# Patient Record
Sex: Female | Born: 1935 | Race: White | Hispanic: No | Marital: Single | Smoking: Former smoker
Health system: Southern US, Community
[De-identification: ages and names within clinical notes are randomized; demographics above are authoritative.]

## PROBLEM LIST (undated history)

## (undated) DIAGNOSIS — H269 Unspecified cataract: Secondary | ICD-10-CM

## (undated) DIAGNOSIS — N390 Urinary tract infection, site not specified: Secondary | ICD-10-CM

## (undated) DIAGNOSIS — K317 Polyp of stomach and duodenum: Secondary | ICD-10-CM

## (undated) DIAGNOSIS — T4145XA Adverse effect of unspecified anesthetic, initial encounter: Secondary | ICD-10-CM

## (undated) DIAGNOSIS — J4489 Other specified chronic obstructive pulmonary disease: Secondary | ICD-10-CM

## (undated) DIAGNOSIS — Z950 Presence of cardiac pacemaker: Secondary | ICD-10-CM

## (undated) DIAGNOSIS — C439 Malignant melanoma of skin, unspecified: Secondary | ICD-10-CM

## (undated) DIAGNOSIS — J449 Chronic obstructive pulmonary disease, unspecified: Secondary | ICD-10-CM

## (undated) DIAGNOSIS — T8859XA Other complications of anesthesia, initial encounter: Secondary | ICD-10-CM

## (undated) DIAGNOSIS — J329 Chronic sinusitis, unspecified: Secondary | ICD-10-CM

## (undated) DIAGNOSIS — K2960 Other gastritis without bleeding: Secondary | ICD-10-CM

## (undated) DIAGNOSIS — K2270 Barrett's esophagus without dysplasia: Secondary | ICD-10-CM

## (undated) DIAGNOSIS — I714 Abdominal aortic aneurysm, without rupture, unspecified: Secondary | ICD-10-CM

## (undated) DIAGNOSIS — F3289 Other specified depressive episodes: Secondary | ICD-10-CM

## (undated) DIAGNOSIS — M1990 Unspecified osteoarthritis, unspecified site: Secondary | ICD-10-CM

## (undated) DIAGNOSIS — F329 Major depressive disorder, single episode, unspecified: Secondary | ICD-10-CM

## (undated) DIAGNOSIS — I509 Heart failure, unspecified: Secondary | ICD-10-CM

## (undated) DIAGNOSIS — I340 Nonrheumatic mitral (valve) insufficiency: Secondary | ICD-10-CM

## (undated) DIAGNOSIS — F419 Anxiety disorder, unspecified: Secondary | ICD-10-CM

## (undated) DIAGNOSIS — G2581 Restless legs syndrome: Secondary | ICD-10-CM

## (undated) DIAGNOSIS — I480 Paroxysmal atrial fibrillation: Secondary | ICD-10-CM

## (undated) DIAGNOSIS — M7120 Synovial cyst of popliteal space [Baker], unspecified knee: Secondary | ICD-10-CM

## (undated) DIAGNOSIS — I723 Aneurysm of iliac artery: Secondary | ICD-10-CM

## (undated) DIAGNOSIS — I5022 Chronic systolic (congestive) heart failure: Secondary | ICD-10-CM

## (undated) DIAGNOSIS — J189 Pneumonia, unspecified organism: Secondary | ICD-10-CM

## (undated) DIAGNOSIS — G4700 Insomnia, unspecified: Secondary | ICD-10-CM

## (undated) DIAGNOSIS — M8580 Other specified disorders of bone density and structure, unspecified site: Secondary | ICD-10-CM

## (undated) DIAGNOSIS — Z95810 Presence of automatic (implantable) cardiac defibrillator: Secondary | ICD-10-CM

## (undated) DIAGNOSIS — I428 Other cardiomyopathies: Secondary | ICD-10-CM

## (undated) DIAGNOSIS — K432 Incisional hernia without obstruction or gangrene: Secondary | ICD-10-CM

## (undated) DIAGNOSIS — M40209 Unspecified kyphosis, site unspecified: Secondary | ICD-10-CM

## (undated) DIAGNOSIS — K5792 Diverticulitis of intestine, part unspecified, without perforation or abscess without bleeding: Secondary | ICD-10-CM

## (undated) DIAGNOSIS — E039 Hypothyroidism, unspecified: Secondary | ICD-10-CM

## (undated) DIAGNOSIS — I10 Essential (primary) hypertension: Secondary | ICD-10-CM

## (undated) DIAGNOSIS — J339 Nasal polyp, unspecified: Secondary | ICD-10-CM

## (undated) DIAGNOSIS — M25569 Pain in unspecified knee: Secondary | ICD-10-CM

## (undated) DIAGNOSIS — K219 Gastro-esophageal reflux disease without esophagitis: Secondary | ICD-10-CM

## (undated) DIAGNOSIS — T7840XA Allergy, unspecified, initial encounter: Secondary | ICD-10-CM

## (undated) DIAGNOSIS — I447 Left bundle-branch block, unspecified: Secondary | ICD-10-CM

## (undated) DIAGNOSIS — Z5189 Encounter for other specified aftercare: Secondary | ICD-10-CM

## (undated) DIAGNOSIS — L659 Nonscarring hair loss, unspecified: Secondary | ICD-10-CM

## (undated) DIAGNOSIS — J42 Unspecified chronic bronchitis: Secondary | ICD-10-CM

## (undated) DIAGNOSIS — E785 Hyperlipidemia, unspecified: Secondary | ICD-10-CM

## (undated) DIAGNOSIS — D126 Benign neoplasm of colon, unspecified: Secondary | ICD-10-CM

## (undated) DIAGNOSIS — R002 Palpitations: Secondary | ICD-10-CM

## (undated) DIAGNOSIS — F341 Dysthymic disorder: Secondary | ICD-10-CM

## (undated) HISTORY — DX: Other specified chronic obstructive pulmonary disease: J44.89

## (undated) HISTORY — DX: Nonscarring hair loss, unspecified: L65.9

## (undated) HISTORY — DX: Dysthymic disorder: F34.1

## (undated) HISTORY — PX: TONSILLECTOMY: SUR1361

## (undated) HISTORY — DX: Unspecified kyphosis, site unspecified: M40.209

## (undated) HISTORY — DX: Left bundle-branch block, unspecified: I44.7

## (undated) HISTORY — PX: OTHER SURGICAL HISTORY: SHX169

## (undated) HISTORY — DX: Diverticulitis of intestine, part unspecified, without perforation or abscess without bleeding: K57.92

## (undated) HISTORY — DX: Other gastritis without bleeding: K29.60

## (undated) HISTORY — DX: Polyp of stomach and duodenum: K31.7

## (undated) HISTORY — PX: COLONOSCOPY: SHX174

## (undated) HISTORY — DX: Restless legs syndrome: G25.81

## (undated) HISTORY — DX: Allergy, unspecified, initial encounter: T78.40XA

## (undated) HISTORY — DX: Presence of automatic (implantable) cardiac defibrillator: Z95.810

## (undated) HISTORY — DX: Hyperlipidemia, unspecified: E78.5

## (undated) HISTORY — DX: Other specified disorders of bone density and structure, unspecified site: M85.80

## (undated) HISTORY — DX: Malignant melanoma of skin, unspecified: C43.9

## (undated) HISTORY — PX: POLYPECTOMY: SHX149

## (undated) HISTORY — DX: Anxiety disorder, unspecified: F41.9

## (undated) HISTORY — DX: Pain in unspecified knee: M25.569

## (undated) HISTORY — DX: Nasal polyp, unspecified: J33.9

## (undated) HISTORY — DX: Heart failure, unspecified: I50.9

## (undated) HISTORY — DX: Nonrheumatic mitral (valve) insufficiency: I34.0

## (undated) HISTORY — DX: Unspecified chronic bronchitis: J42

## (undated) HISTORY — DX: Palpitations: R00.2

## (undated) HISTORY — PX: UPPER GASTROINTESTINAL ENDOSCOPY: SHX188

## (undated) HISTORY — DX: Barrett's esophagus without dysplasia: K22.70

## (undated) HISTORY — DX: Essential (primary) hypertension: I10

## (undated) HISTORY — DX: Other cardiomyopathies: I42.8

## (undated) HISTORY — DX: Unspecified osteoarthritis, unspecified site: M19.90

## (undated) HISTORY — PX: APPENDECTOMY: SHX54

## (undated) HISTORY — DX: Other specified depressive episodes: F32.89

## (undated) HISTORY — DX: Unspecified cataract: H26.9

## (undated) HISTORY — DX: Chronic sinusitis, unspecified: J32.9

## (undated) HISTORY — DX: Encounter for other specified aftercare: Z51.89

## (undated) HISTORY — DX: Gastro-esophageal reflux disease without esophagitis: K21.9

## (undated) HISTORY — DX: Benign neoplasm of colon, unspecified: D12.6

## (undated) HISTORY — DX: Synovial cyst of popliteal space (Baker), unspecified knee: M71.20

## (undated) HISTORY — DX: Major depressive disorder, single episode, unspecified: F32.9

## (undated) HISTORY — DX: Chronic obstructive pulmonary disease, unspecified: J44.9

## (undated) HISTORY — DX: Insomnia, unspecified: G47.00

## (undated) HISTORY — DX: Hypothyroidism, unspecified: E03.9

---

## 1974-12-07 HISTORY — PX: ABDOMINAL HYSTERECTOMY: SHX81

## 1982-03-08 HISTORY — PX: OTHER SURGICAL HISTORY: SHX169

## 1986-03-08 HISTORY — PX: OTHER SURGICAL HISTORY: SHX169

## 1997-07-08 ENCOUNTER — Other Ambulatory Visit: Admission: RE | Admit: 1997-07-08 | Discharge: 1997-07-08 | Payer: Self-pay | Admitting: Obstetrics & Gynecology

## 1998-01-06 ENCOUNTER — Other Ambulatory Visit: Admission: RE | Admit: 1998-01-06 | Discharge: 1998-01-06 | Payer: Self-pay | Admitting: Obstetrics & Gynecology

## 1998-07-10 ENCOUNTER — Other Ambulatory Visit: Admission: RE | Admit: 1998-07-10 | Discharge: 1998-07-10 | Payer: Self-pay | Admitting: Obstetrics & Gynecology

## 1999-01-14 ENCOUNTER — Other Ambulatory Visit: Admission: RE | Admit: 1999-01-14 | Discharge: 1999-01-14 | Payer: Self-pay | Admitting: Obstetrics & Gynecology

## 1999-03-09 HISTORY — PX: OTHER SURGICAL HISTORY: SHX169

## 1999-07-13 ENCOUNTER — Other Ambulatory Visit: Admission: RE | Admit: 1999-07-13 | Discharge: 1999-07-13 | Payer: Self-pay | Admitting: Obstetrics & Gynecology

## 2000-09-18 ENCOUNTER — Other Ambulatory Visit: Admission: RE | Admit: 2000-09-18 | Discharge: 2000-09-18 | Payer: Self-pay | Admitting: Obstetrics & Gynecology

## 2002-05-09 ENCOUNTER — Encounter: Payer: Self-pay | Admitting: Gastroenterology

## 2002-06-07 ENCOUNTER — Encounter: Payer: Self-pay | Admitting: Gastroenterology

## 2002-06-07 DIAGNOSIS — K317 Polyp of stomach and duodenum: Secondary | ICD-10-CM

## 2002-06-07 HISTORY — DX: Polyp of stomach and duodenum: K31.7

## 2002-08-13 ENCOUNTER — Encounter: Payer: Self-pay | Admitting: General Surgery

## 2002-08-13 ENCOUNTER — Ambulatory Visit (HOSPITAL_COMMUNITY): Admission: RE | Admit: 2002-08-13 | Discharge: 2002-08-13 | Payer: Self-pay | Admitting: General Surgery

## 2002-08-24 ENCOUNTER — Encounter: Payer: Self-pay | Admitting: General Surgery

## 2002-08-24 ENCOUNTER — Encounter: Admission: RE | Admit: 2002-08-24 | Discharge: 2002-08-24 | Payer: Self-pay | Admitting: General Surgery

## 2002-08-27 ENCOUNTER — Ambulatory Visit (HOSPITAL_BASED_OUTPATIENT_CLINIC_OR_DEPARTMENT_OTHER): Admission: RE | Admit: 2002-08-27 | Discharge: 2002-08-27 | Payer: Self-pay | Admitting: General Surgery

## 2002-08-27 ENCOUNTER — Encounter (INDEPENDENT_AMBULATORY_CARE_PROVIDER_SITE_OTHER): Payer: Self-pay | Admitting: *Deleted

## 2002-08-27 ENCOUNTER — Encounter: Payer: Self-pay | Admitting: General Surgery

## 2002-09-17 ENCOUNTER — Encounter: Payer: Self-pay | Admitting: General Surgery

## 2002-09-17 ENCOUNTER — Ambulatory Visit (HOSPITAL_COMMUNITY): Admission: RE | Admit: 2002-09-17 | Discharge: 2002-09-18 | Payer: Self-pay | Admitting: General Surgery

## 2002-09-17 ENCOUNTER — Encounter (INDEPENDENT_AMBULATORY_CARE_PROVIDER_SITE_OTHER): Payer: Self-pay | Admitting: Specialist

## 2002-10-26 ENCOUNTER — Ambulatory Visit (HOSPITAL_COMMUNITY): Admission: RE | Admit: 2002-10-26 | Discharge: 2002-10-26 | Payer: Self-pay | Admitting: Hematology & Oncology

## 2002-10-26 ENCOUNTER — Encounter: Payer: Self-pay | Admitting: Hematology & Oncology

## 2002-12-18 ENCOUNTER — Encounter: Payer: Self-pay | Admitting: Hematology & Oncology

## 2002-12-18 ENCOUNTER — Ambulatory Visit (HOSPITAL_COMMUNITY): Admission: RE | Admit: 2002-12-18 | Discharge: 2002-12-18 | Payer: Self-pay | Admitting: Hematology & Oncology

## 2003-03-19 ENCOUNTER — Ambulatory Visit (HOSPITAL_COMMUNITY): Admission: RE | Admit: 2003-03-19 | Discharge: 2003-03-19 | Payer: Self-pay | Admitting: Hematology & Oncology

## 2003-04-17 ENCOUNTER — Ambulatory Visit (HOSPITAL_COMMUNITY): Admission: RE | Admit: 2003-04-17 | Discharge: 2003-04-17 | Payer: Self-pay | Admitting: Hematology & Oncology

## 2003-06-12 ENCOUNTER — Ambulatory Visit (HOSPITAL_COMMUNITY): Admission: RE | Admit: 2003-06-12 | Discharge: 2003-06-12 | Payer: Self-pay | Admitting: Hematology & Oncology

## 2003-07-24 ENCOUNTER — Other Ambulatory Visit: Admission: RE | Admit: 2003-07-24 | Discharge: 2003-07-24 | Payer: Self-pay | Admitting: Obstetrics & Gynecology

## 2003-10-09 ENCOUNTER — Ambulatory Visit (HOSPITAL_COMMUNITY): Admission: RE | Admit: 2003-10-09 | Discharge: 2003-10-09 | Payer: Self-pay | Admitting: Hematology & Oncology

## 2004-01-26 ENCOUNTER — Ambulatory Visit: Payer: Self-pay | Admitting: Hematology & Oncology

## 2004-02-03 ENCOUNTER — Ambulatory Visit (HOSPITAL_COMMUNITY): Admission: RE | Admit: 2004-02-03 | Discharge: 2004-02-03 | Payer: Self-pay | Admitting: Hematology & Oncology

## 2004-05-10 ENCOUNTER — Ambulatory Visit: Payer: Self-pay | Admitting: Hematology & Oncology

## 2004-05-13 ENCOUNTER — Ambulatory Visit (HOSPITAL_COMMUNITY): Admission: RE | Admit: 2004-05-13 | Discharge: 2004-05-13 | Payer: Self-pay | Admitting: Hematology & Oncology

## 2004-06-01 ENCOUNTER — Ambulatory Visit: Payer: Self-pay | Admitting: Cardiology

## 2004-06-10 ENCOUNTER — Ambulatory Visit: Payer: Self-pay

## 2004-06-22 ENCOUNTER — Ambulatory Visit: Payer: Self-pay | Admitting: Cardiology

## 2004-06-29 ENCOUNTER — Ambulatory Visit: Payer: Self-pay | Admitting: Cardiology

## 2004-07-02 ENCOUNTER — Inpatient Hospital Stay (HOSPITAL_BASED_OUTPATIENT_CLINIC_OR_DEPARTMENT_OTHER): Admission: RE | Admit: 2004-07-02 | Discharge: 2004-07-02 | Payer: Self-pay | Admitting: Cardiology

## 2004-07-02 ENCOUNTER — Ambulatory Visit: Payer: Self-pay | Admitting: Cardiology

## 2004-07-10 ENCOUNTER — Ambulatory Visit: Payer: Self-pay | Admitting: Cardiology

## 2004-07-23 ENCOUNTER — Ambulatory Visit: Payer: Self-pay | Admitting: Cardiology

## 2004-08-05 ENCOUNTER — Ambulatory Visit: Payer: Self-pay | Admitting: Cardiology

## 2004-08-06 DIAGNOSIS — K2960 Other gastritis without bleeding: Secondary | ICD-10-CM

## 2004-08-06 HISTORY — DX: Other gastritis without bleeding: K29.60

## 2004-08-11 ENCOUNTER — Ambulatory Visit: Payer: Self-pay | Admitting: Gastroenterology

## 2004-08-12 ENCOUNTER — Ambulatory Visit: Payer: Self-pay | Admitting: Gastroenterology

## 2004-08-12 ENCOUNTER — Other Ambulatory Visit: Admission: RE | Admit: 2004-08-12 | Discharge: 2004-08-12 | Payer: Self-pay | Admitting: Gastroenterology

## 2004-08-12 ENCOUNTER — Encounter (INDEPENDENT_AMBULATORY_CARE_PROVIDER_SITE_OTHER): Payer: Self-pay | Admitting: *Deleted

## 2004-08-13 ENCOUNTER — Inpatient Hospital Stay (HOSPITAL_COMMUNITY): Admission: AD | Admit: 2004-08-13 | Discharge: 2004-08-15 | Payer: Self-pay | Admitting: *Deleted

## 2004-08-13 ENCOUNTER — Ambulatory Visit: Payer: Self-pay | Admitting: *Deleted

## 2004-08-13 ENCOUNTER — Emergency Department (HOSPITAL_COMMUNITY): Admission: EM | Admit: 2004-08-13 | Discharge: 2004-08-13 | Payer: Self-pay | Admitting: Emergency Medicine

## 2004-09-03 ENCOUNTER — Ambulatory Visit: Payer: Self-pay | Admitting: Cardiology

## 2004-09-11 ENCOUNTER — Encounter: Payer: Self-pay | Admitting: Gastroenterology

## 2004-09-15 ENCOUNTER — Ambulatory Visit: Payer: Self-pay | Admitting: Hematology & Oncology

## 2004-09-18 ENCOUNTER — Ambulatory Visit (HOSPITAL_COMMUNITY): Admission: RE | Admit: 2004-09-18 | Discharge: 2004-09-18 | Payer: Self-pay | Admitting: Hematology & Oncology

## 2004-09-22 ENCOUNTER — Ambulatory Visit: Payer: Self-pay | Admitting: Cardiology

## 2004-10-12 ENCOUNTER — Ambulatory Visit: Payer: Self-pay | Admitting: Cardiology

## 2004-10-14 ENCOUNTER — Ambulatory Visit: Payer: Self-pay | Admitting: Cardiology

## 2004-10-26 ENCOUNTER — Ambulatory Visit: Payer: Self-pay | Admitting: Internal Medicine

## 2005-03-02 ENCOUNTER — Ambulatory Visit: Payer: Self-pay | Admitting: Hematology & Oncology

## 2005-03-05 ENCOUNTER — Ambulatory Visit (HOSPITAL_COMMUNITY): Admission: RE | Admit: 2005-03-05 | Discharge: 2005-03-05 | Payer: Self-pay | Admitting: Hematology & Oncology

## 2005-05-04 ENCOUNTER — Ambulatory Visit: Payer: Self-pay | Admitting: Internal Medicine

## 2005-05-14 ENCOUNTER — Ambulatory Visit: Payer: Self-pay | Admitting: Internal Medicine

## 2005-05-14 ENCOUNTER — Inpatient Hospital Stay (HOSPITAL_COMMUNITY): Admission: RE | Admit: 2005-05-14 | Discharge: 2005-05-15 | Payer: Self-pay | Admitting: Internal Medicine

## 2005-06-15 ENCOUNTER — Ambulatory Visit: Payer: Self-pay | Admitting: Internal Medicine

## 2005-06-21 ENCOUNTER — Ambulatory Visit: Payer: Self-pay | Admitting: Internal Medicine

## 2005-06-25 ENCOUNTER — Ambulatory Visit: Payer: Self-pay | Admitting: Internal Medicine

## 2005-07-01 ENCOUNTER — Ambulatory Visit: Payer: Self-pay | Admitting: Cardiology

## 2005-07-08 ENCOUNTER — Ambulatory Visit: Payer: Self-pay | Admitting: Cardiology

## 2005-07-15 ENCOUNTER — Ambulatory Visit: Payer: Self-pay | Admitting: Cardiology

## 2005-07-22 ENCOUNTER — Ambulatory Visit: Payer: Self-pay | Admitting: Cardiology

## 2005-08-05 ENCOUNTER — Ambulatory Visit: Payer: Self-pay | Admitting: Cardiology

## 2005-08-16 ENCOUNTER — Ambulatory Visit: Payer: Self-pay | Admitting: Cardiology

## 2005-08-24 ENCOUNTER — Ambulatory Visit: Payer: Self-pay | Admitting: Internal Medicine

## 2005-09-01 ENCOUNTER — Ambulatory Visit: Payer: Self-pay | Admitting: Hematology & Oncology

## 2005-09-07 LAB — CBC WITH DIFFERENTIAL/PLATELET
Basophils Absolute: 0 10*3/uL (ref 0.0–0.1)
Eosinophils Absolute: 0.2 10*3/uL (ref 0.0–0.5)
HGB: 13.8 g/dL (ref 11.6–15.9)
MCV: 91.9 fL (ref 81.0–101.0)
MONO#: 0.7 10*3/uL (ref 0.1–0.9)
MONO%: 10.1 % (ref 0.0–13.0)
NEUT#: 4.2 10*3/uL (ref 1.5–6.5)
Platelets: 234 10*3/uL (ref 145–400)
RBC: 4.42 10*6/uL (ref 3.70–5.32)
RDW: 13.3 % (ref 11.3–14.5)
WBC: 7 10*3/uL (ref 3.9–10.0)

## 2005-09-07 LAB — COMPREHENSIVE METABOLIC PANEL
Albumin: 4 g/dL (ref 3.5–5.2)
Alkaline Phosphatase: 88 U/L (ref 39–117)
BUN: 20 mg/dL (ref 6–23)
CO2: 30 mEq/L (ref 19–32)
Calcium: 9.1 mg/dL (ref 8.4–10.5)
Glucose, Bld: 82 mg/dL (ref 70–99)
Potassium: 4.3 mEq/L (ref 3.5–5.3)
Sodium: 140 mEq/L (ref 135–145)
Total Protein: 6.2 g/dL (ref 6.0–8.3)

## 2005-09-07 LAB — LACTATE DEHYDROGENASE: LDH: 186 U/L (ref 94–250)

## 2005-09-10 ENCOUNTER — Ambulatory Visit (HOSPITAL_COMMUNITY): Admission: RE | Admit: 2005-09-10 | Discharge: 2005-09-10 | Payer: Self-pay | Admitting: Hematology & Oncology

## 2005-09-23 ENCOUNTER — Encounter: Payer: Self-pay | Admitting: Vascular Surgery

## 2005-09-23 ENCOUNTER — Ambulatory Visit: Admission: RE | Admit: 2005-09-23 | Discharge: 2005-09-23 | Payer: Self-pay | Admitting: Hematology & Oncology

## 2006-03-07 ENCOUNTER — Ambulatory Visit: Payer: Self-pay | Admitting: Hematology & Oncology

## 2006-03-11 LAB — CBC WITH DIFFERENTIAL/PLATELET
BASO%: 0.4 % (ref 0.0–2.0)
Eosinophils Absolute: 0.2 10*3/uL (ref 0.0–0.5)
LYMPH%: 23.9 % (ref 14.0–48.0)
MCHC: 34.3 g/dL (ref 32.0–36.0)
MONO#: 0.7 10*3/uL (ref 0.1–0.9)
NEUT#: 4.4 10*3/uL (ref 1.5–6.5)
RBC: 4.55 10*6/uL (ref 3.70–5.32)
RDW: 12.9 % (ref 11.3–14.5)
WBC: 6.9 10*3/uL (ref 3.9–10.0)
lymph#: 1.7 10*3/uL (ref 0.9–3.3)

## 2006-03-11 LAB — COMPREHENSIVE METABOLIC PANEL
ALT: 18 U/L (ref 0–35)
Albumin: 4.4 g/dL (ref 3.5–5.2)
CO2: 27 mEq/L (ref 19–32)
Chloride: 103 mEq/L (ref 96–112)
Glucose, Bld: 81 mg/dL (ref 70–99)
Potassium: 4.2 mEq/L (ref 3.5–5.3)
Sodium: 140 mEq/L (ref 135–145)
Total Bilirubin: 0.4 mg/dL (ref 0.3–1.2)
Total Protein: 6.6 g/dL (ref 6.0–8.3)

## 2006-03-18 ENCOUNTER — Ambulatory Visit (HOSPITAL_COMMUNITY): Admission: RE | Admit: 2006-03-18 | Discharge: 2006-03-18 | Payer: Self-pay | Admitting: Hematology & Oncology

## 2006-08-23 ENCOUNTER — Ambulatory Visit: Payer: Self-pay | Admitting: Hematology & Oncology

## 2006-08-26 LAB — CBC WITH DIFFERENTIAL/PLATELET
BASO%: 0.8 % (ref 0.0–2.0)
LYMPH%: 27 % (ref 14.0–48.0)
MCHC: 35.2 g/dL (ref 32.0–36.0)
MONO#: 0.6 10*3/uL (ref 0.1–0.9)
MONO%: 8.8 % (ref 0.0–13.0)
NEUT#: 4.4 10*3/uL (ref 1.5–6.5)
Platelets: 191 10*3/uL (ref 145–400)
RBC: 4.47 10*6/uL (ref 3.70–5.32)
RDW: 12.9 % (ref 11.3–14.5)
WBC: 7.4 10*3/uL (ref 3.9–10.0)

## 2006-08-26 LAB — COMPREHENSIVE METABOLIC PANEL
ALT: 19 U/L (ref 0–35)
Alkaline Phosphatase: 84 U/L (ref 39–117)
Sodium: 138 mEq/L (ref 135–145)
Total Bilirubin: 0.5 mg/dL (ref 0.3–1.2)
Total Protein: 6.2 g/dL (ref 6.0–8.3)

## 2006-09-02 ENCOUNTER — Ambulatory Visit (HOSPITAL_COMMUNITY): Admission: RE | Admit: 2006-09-02 | Discharge: 2006-09-02 | Payer: Self-pay | Admitting: Hematology & Oncology

## 2006-09-13 ENCOUNTER — Ambulatory Visit: Payer: Self-pay | Admitting: Internal Medicine

## 2007-03-13 ENCOUNTER — Ambulatory Visit: Payer: Self-pay | Admitting: Hematology & Oncology

## 2007-03-15 LAB — COMPREHENSIVE METABOLIC PANEL
ALT: 21 U/L (ref 0–35)
BUN: 13 mg/dL (ref 6–23)
CO2: 25 mEq/L (ref 19–32)
Creatinine, Ser: 0.76 mg/dL (ref 0.40–1.20)
Total Bilirubin: 0.5 mg/dL (ref 0.3–1.2)

## 2007-03-15 LAB — CBC WITH DIFFERENTIAL/PLATELET
BASO%: 0.7 % (ref 0.0–2.0)
HCT: 44 % (ref 34.8–46.6)
LYMPH%: 24.6 % (ref 14.0–48.0)
MCH: 31.8 pg (ref 26.0–34.0)
MCHC: 34.7 g/dL (ref 32.0–36.0)
MONO#: 0.6 10*3/uL (ref 0.1–0.9)
NEUT%: 64 % (ref 39.6–76.8)
Platelets: 221 10*3/uL (ref 145–400)

## 2007-03-15 LAB — LACTATE DEHYDROGENASE: LDH: 229 U/L (ref 94–250)

## 2007-03-17 ENCOUNTER — Ambulatory Visit (HOSPITAL_COMMUNITY): Admission: RE | Admit: 2007-03-17 | Discharge: 2007-03-17 | Payer: Self-pay | Admitting: Hematology & Oncology

## 2007-09-14 ENCOUNTER — Ambulatory Visit: Payer: Self-pay | Admitting: Hematology & Oncology

## 2007-09-19 LAB — COMPREHENSIVE METABOLIC PANEL
CO2: 26 mEq/L (ref 19–32)
Creatinine, Ser: 0.74 mg/dL (ref 0.40–1.20)
Glucose, Bld: 77 mg/dL (ref 70–99)
Total Bilirubin: 0.5 mg/dL (ref 0.3–1.2)

## 2007-09-19 LAB — CBC WITH DIFFERENTIAL/PLATELET
Eosinophils Absolute: 0.3 10*3/uL (ref 0.0–0.5)
HCT: 40.7 % (ref 34.8–46.6)
LYMPH%: 19.7 % (ref 14.0–48.0)
MCHC: 35.3 g/dL (ref 32.0–36.0)
MONO#: 0.7 10*3/uL (ref 0.1–0.9)
NEUT#: 5.3 10*3/uL (ref 1.5–6.5)
NEUT%: 67.5 % (ref 39.6–76.8)
Platelets: 215 10*3/uL (ref 145–400)
WBC: 7.8 10*3/uL (ref 3.9–10.0)

## 2007-09-19 LAB — LACTATE DEHYDROGENASE: LDH: 188 U/L (ref 94–250)

## 2007-09-22 ENCOUNTER — Ambulatory Visit (HOSPITAL_COMMUNITY): Admission: RE | Admit: 2007-09-22 | Discharge: 2007-09-22 | Payer: Self-pay | Admitting: Hematology & Oncology

## 2007-09-26 ENCOUNTER — Ambulatory Visit: Payer: Self-pay | Admitting: Hematology & Oncology

## 2007-11-07 ENCOUNTER — Ambulatory Visit: Payer: Self-pay | Admitting: Internal Medicine

## 2008-01-11 ENCOUNTER — Telehealth: Payer: Self-pay | Admitting: Gastroenterology

## 2008-03-14 ENCOUNTER — Ambulatory Visit: Payer: Self-pay | Admitting: Hematology & Oncology

## 2008-03-27 ENCOUNTER — Ambulatory Visit (HOSPITAL_COMMUNITY): Admission: RE | Admit: 2008-03-27 | Discharge: 2008-03-27 | Payer: Self-pay | Admitting: Hematology & Oncology

## 2008-03-27 ENCOUNTER — Encounter: Payer: Self-pay | Admitting: Gastroenterology

## 2008-04-02 ENCOUNTER — Ambulatory Visit: Payer: Self-pay | Admitting: Hematology & Oncology

## 2008-04-03 ENCOUNTER — Encounter: Payer: Self-pay | Admitting: Gastroenterology

## 2008-04-04 ENCOUNTER — Telehealth (INDEPENDENT_AMBULATORY_CARE_PROVIDER_SITE_OTHER): Payer: Self-pay

## 2008-04-08 ENCOUNTER — Ambulatory Visit: Payer: Self-pay | Admitting: Gastroenterology

## 2008-04-08 DIAGNOSIS — G4730 Sleep apnea, unspecified: Secondary | ICD-10-CM | POA: Insufficient documentation

## 2008-04-08 DIAGNOSIS — K2270 Barrett's esophagus without dysplasia: Secondary | ICD-10-CM

## 2008-04-08 DIAGNOSIS — R1013 Epigastric pain: Secondary | ICD-10-CM | POA: Insufficient documentation

## 2008-04-08 DIAGNOSIS — R933 Abnormal findings on diagnostic imaging of other parts of digestive tract: Secondary | ICD-10-CM | POA: Insufficient documentation

## 2008-04-08 HISTORY — DX: Barrett's esophagus without dysplasia: K22.70

## 2008-04-22 ENCOUNTER — Telehealth: Payer: Self-pay | Admitting: Gastroenterology

## 2008-04-24 ENCOUNTER — Encounter: Payer: Self-pay | Admitting: Gastroenterology

## 2008-04-24 ENCOUNTER — Ambulatory Visit: Payer: Self-pay | Admitting: Gastroenterology

## 2008-04-26 ENCOUNTER — Encounter: Payer: Self-pay | Admitting: Gastroenterology

## 2008-04-29 ENCOUNTER — Telehealth (INDEPENDENT_AMBULATORY_CARE_PROVIDER_SITE_OTHER): Payer: Self-pay | Admitting: *Deleted

## 2008-04-29 DIAGNOSIS — K319 Disease of stomach and duodenum, unspecified: Secondary | ICD-10-CM | POA: Insufficient documentation

## 2008-04-30 ENCOUNTER — Encounter: Payer: Self-pay | Admitting: Gastroenterology

## 2008-05-01 ENCOUNTER — Telehealth: Payer: Self-pay | Admitting: Gastroenterology

## 2008-05-02 ENCOUNTER — Ambulatory Visit (HOSPITAL_COMMUNITY): Admission: RE | Admit: 2008-05-02 | Discharge: 2008-05-02 | Payer: Self-pay | Admitting: Gastroenterology

## 2008-05-02 ENCOUNTER — Encounter: Payer: Self-pay | Admitting: Gastroenterology

## 2008-05-02 ENCOUNTER — Ambulatory Visit: Payer: Self-pay | Admitting: Gastroenterology

## 2008-05-03 ENCOUNTER — Telehealth: Payer: Self-pay | Admitting: Gastroenterology

## 2008-05-08 ENCOUNTER — Encounter: Payer: Self-pay | Admitting: Gastroenterology

## 2008-05-29 ENCOUNTER — Ambulatory Visit: Payer: Self-pay | Admitting: Gastroenterology

## 2008-05-29 DIAGNOSIS — K2270 Barrett's esophagus without dysplasia: Secondary | ICD-10-CM | POA: Insufficient documentation

## 2008-05-29 DIAGNOSIS — K2970 Gastritis, unspecified, without bleeding: Secondary | ICD-10-CM | POA: Insufficient documentation

## 2008-05-29 DIAGNOSIS — K2990 Gastroduodenitis, unspecified, without bleeding: Secondary | ICD-10-CM

## 2008-06-27 ENCOUNTER — Encounter: Payer: Self-pay | Admitting: Gastroenterology

## 2008-09-27 ENCOUNTER — Ambulatory Visit: Payer: Self-pay | Admitting: Hematology & Oncology

## 2008-10-02 LAB — CBC WITH DIFFERENTIAL/PLATELET
BASO%: 0.4 % (ref 0.0–2.0)
EOS%: 2.9 % (ref 0.0–7.0)
MCH: 31.4 pg (ref 25.1–34.0)
MCHC: 34.3 g/dL (ref 31.5–36.0)
MONO#: 0.6 10*3/uL (ref 0.1–0.9)
RBC: 4.41 10*6/uL (ref 3.70–5.45)
WBC: 6.1 10*3/uL (ref 3.9–10.3)
lymph#: 1.4 10*3/uL (ref 0.9–3.3)

## 2008-10-02 LAB — COMPREHENSIVE METABOLIC PANEL
AST: 22 U/L (ref 0–37)
Alkaline Phosphatase: 79 U/L (ref 39–117)
Glucose, Bld: 90 mg/dL (ref 70–99)
Sodium: 134 mEq/L — ABNORMAL LOW (ref 135–145)
Total Bilirubin: 0.7 mg/dL (ref 0.3–1.2)
Total Protein: 6.3 g/dL (ref 6.0–8.3)

## 2008-10-04 ENCOUNTER — Encounter (INDEPENDENT_AMBULATORY_CARE_PROVIDER_SITE_OTHER): Payer: Self-pay | Admitting: *Deleted

## 2008-10-04 ENCOUNTER — Ambulatory Visit (HOSPITAL_COMMUNITY): Admission: RE | Admit: 2008-10-04 | Discharge: 2008-10-04 | Payer: Self-pay | Admitting: Hematology & Oncology

## 2008-10-09 ENCOUNTER — Ambulatory Visit: Payer: Self-pay | Admitting: Hematology & Oncology

## 2008-10-10 ENCOUNTER — Encounter: Payer: Self-pay | Admitting: Gastroenterology

## 2008-10-31 ENCOUNTER — Encounter: Payer: Self-pay | Admitting: Gastroenterology

## 2008-11-04 ENCOUNTER — Ambulatory Visit: Payer: Self-pay | Admitting: Gastroenterology

## 2008-11-12 ENCOUNTER — Telehealth: Payer: Self-pay | Admitting: Gastroenterology

## 2008-11-28 ENCOUNTER — Telehealth: Payer: Self-pay | Admitting: Gastroenterology

## 2008-12-10 ENCOUNTER — Ambulatory Visit: Payer: Self-pay | Admitting: Internal Medicine

## 2008-12-10 DIAGNOSIS — Z95810 Presence of automatic (implantable) cardiac defibrillator: Secondary | ICD-10-CM | POA: Insufficient documentation

## 2008-12-12 DIAGNOSIS — I5023 Acute on chronic systolic (congestive) heart failure: Secondary | ICD-10-CM | POA: Insufficient documentation

## 2008-12-12 DIAGNOSIS — I5022 Chronic systolic (congestive) heart failure: Secondary | ICD-10-CM

## 2008-12-19 ENCOUNTER — Encounter: Payer: Self-pay | Admitting: Gastroenterology

## 2009-02-03 ENCOUNTER — Encounter (INDEPENDENT_AMBULATORY_CARE_PROVIDER_SITE_OTHER): Payer: Self-pay | Admitting: *Deleted

## 2009-02-03 ENCOUNTER — Ambulatory Visit: Payer: Self-pay | Admitting: Gastroenterology

## 2009-02-03 DIAGNOSIS — K219 Gastro-esophageal reflux disease without esophagitis: Secondary | ICD-10-CM | POA: Insufficient documentation

## 2009-02-03 DIAGNOSIS — F411 Generalized anxiety disorder: Secondary | ICD-10-CM | POA: Insufficient documentation

## 2009-02-05 ENCOUNTER — Encounter: Payer: Self-pay | Admitting: Gastroenterology

## 2009-02-06 ENCOUNTER — Ambulatory Visit (HOSPITAL_COMMUNITY): Admission: RE | Admit: 2009-02-06 | Discharge: 2009-02-06 | Payer: Self-pay | Admitting: Gastroenterology

## 2009-03-11 ENCOUNTER — Telehealth: Payer: Self-pay | Admitting: Gastroenterology

## 2009-03-27 ENCOUNTER — Ambulatory Visit: Payer: Self-pay | Admitting: Hematology & Oncology

## 2009-03-28 ENCOUNTER — Encounter: Payer: Self-pay | Admitting: Gastroenterology

## 2009-03-28 LAB — CBC WITH DIFFERENTIAL (CANCER CENTER ONLY)
BASO#: 0 10*3/uL (ref 0.0–0.2)
HCT: 38.5 % (ref 34.8–46.6)
HGB: 13.4 g/dL (ref 11.6–15.9)
LYMPH#: 1.6 10*3/uL (ref 0.9–3.3)
LYMPH%: 26.3 % (ref 14.0–48.0)
MCHC: 34.9 g/dL (ref 32.0–36.0)
MCV: 90 fL (ref 81–101)
MONO#: 0.4 10*3/uL (ref 0.1–0.9)
NEUT%: 63 % (ref 39.6–80.0)
RDW: 11 % (ref 10.5–14.6)
WBC: 6.2 10*3/uL (ref 3.9–10.0)

## 2009-03-28 LAB — COMPREHENSIVE METABOLIC PANEL
BUN: 11 mg/dL (ref 6–23)
CO2: 25 mEq/L (ref 19–32)
Calcium: 8.8 mg/dL (ref 8.4–10.5)
Chloride: 90 mEq/L — ABNORMAL LOW (ref 96–112)
Creatinine, Ser: 0.73 mg/dL (ref 0.40–1.20)
Total Bilirubin: 0.4 mg/dL (ref 0.3–1.2)

## 2009-03-28 LAB — VITAMIN D 25 HYDROXY (VIT D DEFICIENCY, FRACTURES): Vit D, 25-Hydroxy: 39 ng/mL (ref 30–89)

## 2009-05-19 ENCOUNTER — Ambulatory Visit: Payer: Self-pay | Admitting: Gastroenterology

## 2009-06-04 ENCOUNTER — Encounter: Admission: RE | Admit: 2009-06-04 | Discharge: 2009-06-04 | Payer: Self-pay | Admitting: Internal Medicine

## 2009-07-04 ENCOUNTER — Encounter: Admission: RE | Admit: 2009-07-04 | Discharge: 2009-07-04 | Payer: Self-pay | Admitting: Internal Medicine

## 2009-09-02 ENCOUNTER — Encounter (INDEPENDENT_AMBULATORY_CARE_PROVIDER_SITE_OTHER): Payer: Self-pay | Admitting: *Deleted

## 2009-09-24 ENCOUNTER — Ambulatory Visit (HOSPITAL_COMMUNITY): Admission: RE | Admit: 2009-09-24 | Discharge: 2009-09-24 | Payer: Self-pay | Admitting: Orthopedic Surgery

## 2009-10-13 ENCOUNTER — Ambulatory Visit (HOSPITAL_COMMUNITY): Admission: RE | Admit: 2009-10-13 | Discharge: 2009-10-13 | Payer: Self-pay | Admitting: Cardiology

## 2009-10-13 ENCOUNTER — Ambulatory Visit: Payer: Self-pay | Admitting: Cardiology

## 2009-10-20 ENCOUNTER — Ambulatory Visit: Payer: Self-pay | Admitting: Cardiology

## 2010-01-15 ENCOUNTER — Encounter: Payer: Self-pay | Admitting: Gastroenterology

## 2010-01-20 ENCOUNTER — Encounter: Payer: Self-pay | Admitting: Gastroenterology

## 2010-01-22 ENCOUNTER — Encounter: Payer: Self-pay | Admitting: Internal Medicine

## 2010-01-22 ENCOUNTER — Ambulatory Visit: Payer: Self-pay | Admitting: Cardiology

## 2010-01-27 ENCOUNTER — Ambulatory Visit: Payer: Self-pay | Admitting: Gastroenterology

## 2010-02-03 ENCOUNTER — Ambulatory Visit: Payer: Self-pay | Admitting: Internal Medicine

## 2010-03-29 ENCOUNTER — Encounter: Payer: Self-pay | Admitting: Gastroenterology

## 2010-04-02 ENCOUNTER — Ambulatory Visit: Payer: Self-pay | Admitting: Hematology & Oncology

## 2010-04-03 ENCOUNTER — Encounter: Payer: Self-pay | Admitting: Gastroenterology

## 2010-04-09 NOTE — Letter (Signed)
Summary: [facility] Office Visit Note   [facility] Office Visit Note   Imported By: Roderic Ovens 02/24/2010 16:18:49  _____________________________________________________________________  External Attachment:    Type:   Image     Comment:   External Document

## 2010-04-09 NOTE — Letter (Signed)
[facility]   Imported By: Sherian Rein 04/18/2009 16:11:03  _____________________________________________________________________  External Attachment:    Type:   Image     Comment:   External Document

## 2010-04-09 NOTE — Assessment & Plan Note (Signed)
Summary: f/u--ch.   History of Present Illness Visit Type: Follow-up Visit Primary GI MD: Elie Goody MD [facility] Primary Makaila Windle: Jae Dire, MD Requesting Jahne Krukowski: n/a Chief Complaint: Pt states that she was having upper abd pain and GERD but states that she feels better and denies any GI complaints at this time  History of Present Illness:   Ms. Suchecki returns today noting a occasional epigastric pain and occasional reflux symptoms. She has no dysphagia, odynophagia, or weight loss. Her symptoms respond well to over-the-counter Maalox. She underwent a chest, abdomen and pelvic CT in July 2010 and an abdominal ultrasound in December 2010.  She is very anxious about Barrett's esophagus and cancer, potential. She states she becomes very anxious when she has any breakthrough reflux symptoms.    GI Review of Systems    Reports abdominal pain, acid reflux, and  heartburn.     Location of  Abdominal pain: upper abdomen.    Denies belching, bloating, chest pain, dysphagia with liquids, dysphagia with solids, loss of appetite, nausea, vomiting, vomiting blood, weight loss, and  weight gain.        Denies anal fissure, black tarry stools, change in bowel habit, constipation, diarrhea, diverticulosis, fecal incontinence, heme positive stool, hemorrhoids, irritable bowel syndrome, jaundice, light color stool, liver problems, rectal bleeding, and  rectal pain.   Current Medications (verified): 1)  Lasix 40 Mg Tabs (Furosemide) .... 1/2 Tablet By Mouth Once Daily 2)  Potassium Chloride Crys Cr 20 Meq Cr-Tabs (Potassium Chloride Crys Cr) .... Once Daily 3)  Digoxin 0.125 Mg Tabs (Digoxin) .... One Tablet By Mouth Once Daily 4)  Synthroid 112 Mcg Tabs (Levothyroxine Sodium) .... Once Daily 5)  Mavik 2 Mg Tabs (Trandolapril) .... One Tablet By Mouth Two Times A Day 6)  Metoprolol Tartrate 25 Mg Tabs (Metoprolol Tartrate) .... Take 1/2 Tab By Mouth Once Daily 7)  Aspirin 81 Mg  Tabs (Aspirin)  .... One Tablet By Mouth Once Daily 8)  Est Estrogens-Methyltest Hs 0.625-1.25 Mg Tabs (Est Estrogens-Methyltest) .... Once Daily 9)  Centrum Silver  Tabs (Multiple Vitamins-Minerals) .... Once Daily 10)  Vitamin C 1000 Mg Tabs (Ascorbic Acid) .... Take 2 Tablets Daily 11)  Citracal/vitamin D 250-200 Mg-Unit Tabs (Calcium Citrate-Vitamin D) .... Take Two By Mouth Once Daily 12)  Biotin 1000 Mcg Tabs (Biotin) .... Once Daily 13)  Aciphex 20 Mg Tbec (Rabeprazole Sodium) .... Take 1 Capsule By Mouth One Half Hour Before Breakfast  Allergies (verified): 1)  ! Floxin 2)  ! Keflex 3)  ! Doxycycline 4)  ! Penicillin 5)  ! Amoxicillin  Past History:  Past Medical History: Nonischemic cardiomyopathy CHF Left bundle branch block Mitral regurgitation Melanoma Hypothyroidism Erosive gastritis, 08/2004 Hyperplastic polyps, 06/2002 Barretts esophagus 04/2008 GERD 562.10: Diverticulosis Anxiety Disorder Sleep Apnea Arthritis Hypertension  Past Surgical History: Defibrillator insertion  Arm surgery Hysterectomy Lumpectomy-1988 Appendectomy Tonsillectomy Guidant H110RF dual chamber 05/14/05 Right Knee Surgery   Family History: Reviewed history from 02/03/2009 and no changes required. Family History of Heart Disease:maternal side Family History of Colon Cancer:paternal Uncle unsure age Family History of Breast Cancer:maternal aunt  Social History: Reviewed history from 02/03/2009 and no changes required. Patient is a former smoker.  Alcohol Use - no Illicit Drug Use - no Occupation: Retired  Review of Systems       The patient complains of allergy/sinus, arthritis/joint pain, and muscle pains/cramps.         The pertinent positives and negatives are noted as above and  in the HPI. All other ROS were reviewed and were negative.   Vital Signs:  Patient profile:   77 year old female Height:      69 inches Weight:      140 pounds BMI:     20.75 BSA:     1.78 Pulse rate:    60 / minute Pulse rhythm:   regular BP sitting:   98 / 62  (left arm) Cuff size:   regular  Vitals Entered By: Ok Anis CMA (January 27, 2010 3:20 PM)   Physical Exam  General:  Well developed, well nourished, no acute distress. Head:  Normocephalic and atraumatic. Eyes:  PERRLA, no icterus. Ears:  Normal auditory acuity. Mouth:  No deformity or lesions, dentition normal. Lungs:  Clear throughout to auscultation. Heart:  Regular rate and rhythm; no murmurs, rubs,  or bruits. Abdomen:  Soft, nontender and nondistended. No masses, hepatosplenomegaly or hernias noted. Normal bowel sounds. Psych:  anxious.    Impression & Recommendations:  Problem # 1:  BARRETT'S ESOPHAGUS (ICD-530.85) We discussed the low risk of malignant degeneration of Barrett's. I attempted to reassure her. We scheduled an endoscopy last year, but due to other medical problems, and the fact that her reflux was better she decided to postpone the procedure. I reassured her that the risk of degeneration of Barrett's esophagus was quite low and as long as she remains on a proton pump inhibitor, antireflux measures and a surveillance endoscopy program she should be fine. I think an endoscopy at 3 years from her initial endoscopy would be appropriate for surveillance as long as her symptoms remain under good control and she appears to be comfortable with this recommendation.  She states she would like to try an alternative proton pump inhibitor to see if her symptoms could be better controlled. She will be given samples of Prilosec, Nexium and Dexilant to try for 7-14 days each and she will contact us with her preference. She will remain on antireflux measures and she can use Maalox or TUMS as needed. Bone density should be followed at routine intervals due to her age, sex and long term PPI use by Dr. Chilton Si.  Problem # 2:  ANXIETY (ICD-300.00) She has a great deal of anxiety about her health conditions. She might benefit  from therapy. Defer to Dr. Chilton Si for evaluation and treatment.  Problem # 3:  SCREENING COLORECTAL-CANCER (ICD-V76.51) Routine screening for colorectal cancer. Due in April 2014.  Patient Instructions: 1)  Samples of Prilosec, Dexilant and Nexium given to patient to try for several weeks. Call us back if any of these medications work better for your reflux and we can send in a prescription.  2)  Copy sent to : Varney Daily, MD 3)  The medication list was reviewed and reconciled.  All changed / newly prescribed medications were explained.  A complete medication list was provided to the patient / caregiver.

## 2010-04-09 NOTE — Assessment & Plan Note (Signed)
Summary: device/saf    Visit Type:  Follow-up Referring [name]:  na Primary [name]:  [name] Dire, MD   History of Present Illness: [name] returns today for followup.  She is a pleasant 77 yo woman with a DCM, CHF, and LBBB who underwent BiV ICD several yrs ago.  She has not been rehospitalized for CHF since I last saw her.  Her only complaint today is related to alopecia.  She denies c/p, sob or any ICD therapies.  She has minimal peripheral edema.  She has gained some weight since her ICD was implanted. Her EF has improved.  Current Medications (verified): 1)  Lasix 40 Mg Tabs (Furosemide) .... 1/2 Tablet By Mouth Once Daily 2)  Potassium Chloride Crys Cr 20 Meq Cr-Tabs (Potassium Chloride Crys Cr) .... Once Daily 3)  Digoxin 0.125 Mg Tabs (Digoxin) .... One Tablet By Mouth Once Daily 4)  Synthroid 112 Mcg Tabs (Levothyroxine Sodium) .... Once Daily 5)  Mavik 2 Mg Tabs (Trandolapril) .... One Tablet By Mouth Two Times A Day 6)  Metoprolol Tartrate 25 Mg Tabs (Metoprolol Tartrate) .... Take 1/2 Tab By Mouth Once Daily 7)  Aspirin 81 Mg  Tabs (Aspirin) .... One Tablet By Mouth Once Daily 8)  Est Estrogens-Methyltest Hs 0.625-1.25 Mg Tabs (Est Estrogens-Methyltest) .... Once Daily 9)  Centrum Silver  Tabs (Multiple Vitamins-Minerals) .... Once Daily 10)  Vitamin C 1000 Mg Tabs (Ascorbic Acid) .... Take 2 Tablets Daily 11)  Citracal/vitamin D 250-200 Mg-Unit Tabs (Calcium Citrate-Vitamin D) .... Take Two By Mouth Once Daily 12)  Biotin 1000 Mcg Tabs (Biotin) .... Once Daily 13)  Aciphex 20 Mg Tbec (Rabeprazole Sodium) .... Take 1 Capsule By Mouth One Half Hour Before Breakfast  Allergies: 1)  ! Floxin 2)  ! Keflex 3)  ! Doxycycline 4)  ! Penicillin 5)  ! Amoxicillin  Past History:  Past Medical History: Last updated: 01/27/2010 Nonischemic cardiomyopathy CHF Left bundle branch block Mitral regurgitation Melanoma Hypothyroidism Erosive gastritis,  08/2004 Hyperplastic polyps, 06/2002 Barretts esophagus 04/2008 GERD 562.10: Diverticulosis Anxiety Disorder Sleep Apnea Arthritis Hypertension  Past Surgical History: Last updated: 01/27/2010 Defibrillator insertion  Arm surgery Hysterectomy Lumpectomy-1988 Appendectomy Tonsillectomy Guidant H110RF dual chamber 05/14/05 Right Knee Surgery   Review of Systems  The patient denies chest pain, syncope, dyspnea on exertion, and peripheral edema.    Vital Signs:  Patient profile:   77 year old female Height:      69 inches Weight:      141 pounds BMI:     20.90 Pulse rate:   55 / minute BP sitting:   110 / 70  (left arm)  Vitals Entered By: Laurance Flatten CMA (February 03, 2010 3:09 PM)  Physical Exam  General:  Well developed, well nourished, no acute distress. Head:  Normocephalic and atraumatic. Eyes:  PERRLA, no icterus. Mouth:  No deformity or lesions, dentition normal. Neck:  Neck supple, no JVD. No masses, thyromegaly or abnormal cervical nodes. Chest Wall:  Well healed ICD incision. Lungs:  Clear throughout to auscultation. Heart:  Regular rate and rhythm; no murmurs, rubs,  or bruits. Abdomen:  Soft, nontender and nondistended. No masses, hepatosplenomegaly or hernias noted. Normal bowel sounds. Msk:  Symmetrical with no gross deformities. Normal posture. Pulses:  Normal pulses noted. Extremities:  No clubbing, cyanosis, edema or deformities noted. Neurologic:  Alert and  oriented x4;  grossly normal neurologically.    ICD Specifications Following MD:  [name], MD     Referring MD:  Peter Swaziland, MD ICD Vendor:  [facility] [employer]     ICD Model Number:  H210     ICD Serial Number:  [id number] ICD DOI:  05/14/2005      Lead 1:    Location: RA     DOI: 05/14/2005     Model #: [id number]     Serial #: [id number]     Status: active Lead 2:    Location: RV     DOI: 05/14/2005     Model #: [id number]     Serial #: [id number]     Status: active Lead 3:    Location: LV     DOI:  05/14/2005     Model #: 4543     Serial #: [id number]     Status: active  ICD Follow Up Remote Check?  No Battery Voltage:  2.58 V     Charge Time:  8.4 seconds     Underlying rhythm:  Huston Foley ICD Dependent:  No       ICD Device Measurements Atrium:  Amplitude: 2.1 mV, Impedance: 640 ohms, Threshold: 1.0 V at 0.4 msec Right Ventricle:  Amplitude: 14.6 mV, Impedance: 537 ohms, Threshold: 1.8 V at 0.5 msec Left Ventricle:  Amplitude: 7.6 mV, Impedance: 1028 ohms, Threshold: 1.0 V at 1.0 msec Shock Impedance: 41 ohms   Episodes MS Episodes:  0     Percent Mode Switch:  0     Coumadin:  No Shock:  0     ATP:  0     Nonsustained:  0     Atrial Pacing:  73%     Ventricular Pacing:  100%  Brady Parameters Mode DDD     Lower Rate Limit:  55     Upper Rate Limit 130 PAV 140      Tachy Zones VF:  240     VT:  200     VT1:  170     Next Cardiology Appt Due:  04/08/2010 Tech Comments:  No parameter changes. Device function normal.  No Latitude @ this time.  ROV 3 months clinic.  Checked by [employer], LPN  February 03, 2010 3:16 PM  MD Comments:  Agree with above.  Impression & Recommendations:  Problem # 1:  AUTOMATIC IMPLANTABLE CARDIAC DEFIBRILLATOR SITU (ICD-V45.02) Her device is working normally. Will recheck in several months.  Problem # 2:  CHRONIC SYSTOLIC HEART FAILURE (ICD-428.22) Her LV function is improved. Her CHF is class 2. She will continue a low sodium diet and her current meds. Her updated medication list for this problem includes:    Lasix 40 Mg Tabs (Furosemide) .Marland Kitchen... 1/2 tablet by mouth once daily    Digoxin 0.125 Mg Tabs (Digoxin) ..... One tablet by mouth once daily    Mavik 2 Mg Tabs (Trandolapril) ..... One tablet by mouth two times a day    Metoprolol Tartrate 25 Mg Tabs (Metoprolol tartrate) .Marland Kitchen... Take 1/2 tab by mouth once daily    Aspirin 81 Mg Tabs (Aspirin) ..... One tablet by mouth once daily  Patient Instructions: 1)  Your physician wants you to  follow-up in:  6 months with Dr Ladona Ridgel and 3 months with device clinic You will receive a reminder letter in the mail two months in advance. If you don't receive a letter, please call our office to schedule the follow-up appointment.

## 2010-04-09 NOTE — Assessment & Plan Note (Signed)
Summary: f/u ...em   History of Present Illness Visit Type: Follow-up Visit Primary GI MD: Elie Goody MD [facility] Primary Provider: Jae Dire, MD Requesting Provider: n/a Chief Complaint: F/U GERD, symptoms have improved; patient would like to discuss her meds and possible follow-up Endo History of Present Illness:   [name] returns for followup of GERD and Barrett's esophagus. Her reflux symptoms have been under excellent control on AcipHex. She would like to consider changing medications for unclear reasons. She is very anxious about her diagnosis of Barrett's esophagus and states she never heard about this condition. Prior to being diagnosed. She is concerned about a the risk of esophageal cancer and Barrett's.   GI Review of Systems    Reports acid reflux and  heartburn.      Denies abdominal pain, belching, bloating, chest pain, dysphagia with liquids, dysphagia with solids, loss of appetite, nausea, vomiting, vomiting blood, weight loss, and  weight gain.        Denies anal fissure, black tarry stools, change in bowel habit, constipation, diarrhea, diverticulosis, fecal incontinence, heme positive stool, hemorrhoids, irritable bowel syndrome, jaundice, light color stool, liver problems, rectal bleeding, and  rectal pain.   Current Medications (verified): 1)  Lasix 40 Mg Tabs (Furosemide) .Marland Kitchen.. 1 Tablet By Mouth Once Daily 2)  Potassium Chloride Crys Cr 20 Meq Cr-Tabs (Potassium Chloride Crys Cr) .... Once Daily 3)  Digoxin 0.125 Mg Tabs (Digoxin) .... One Tablet By Mouth Once Daily 4)  Synthroid 112 Mcg Tabs (Levothyroxine Sodium) .... Once Daily 5)  Mavik 2 Mg Tabs (Trandolapril) .... One Tablet By Mouth Two Times A Day 6)  Metoprolol Tartrate 25 Mg Tabs (Metoprolol Tartrate) .... Take 1/2 Tab By Mouth Once Daily 7)  Aspirin 81 Mg  Tabs (Aspirin) .... One Tablet By Mouth Once Daily 8)  Est Estrogens-Methyltest Hs 0.625-1.25 Mg Tabs (Est Estrogens-Methyltest) .... Once  Daily 9)  Centrum Silver  Tabs (Multiple Vitamins-Minerals) .... Once Daily 10)  Vitamin C 1000 Mg Tabs (Ascorbic Acid) .... Take 2 Tablets Daily 11)  Citracal/vitamin D 250-200 Mg-Unit Tabs (Calcium Citrate-Vitamin D) .... Take Two By Mouth Once Daily 12)  Biotin 1000 Mcg Tabs (Biotin) .... Once Daily 13)  Aciphex 20 Mg Tbec (Rabeprazole Sodium) .... Take 1 Capsule By Mouth One Half Hour Before Breakfast  Allergies (verified): 1)  ! Floxin 2)  ! Keflex 3)  ! Doxycycline 4)  ! Penicillin 5)  ! Amoxicillin  Past History:  Past Medical History: Reviewed history from 11/04/2008 and no changes required. Nonischemic cardiomyopathy CHF Left bundle branch block Mitral regurgitation Melanoma Hypothyroidism Erosive gastritis, 08/2004 Diverticulosis Hyperplastic polyps, 06/2002 Barretts esophagus 04/2008 GERD  Past Surgical History: Reviewed history from 12/06/2008 and no changes required. Defibrillator insertion  Arm surgery Hysterectomy Lumpectomy-1988 Appendectomy Tonsillectomy Guidant H110RF dual chamber 05/14/05  Family History: Reviewed history from 02/03/2009 and no changes required. Family History of Heart Disease:maternal side Family History of Colon Cancer:paternal Uncle unsure age Family History of Breast Cancer:maternal aunt  Social History: Reviewed history from 02/03/2009 and no changes required. Patient is a former smoker.  Alcohol Use - no Illicit Drug Use - no Occupation: Retired  Review of Systems       The patient complains of depression-new, fatigue, heart rhythm changes, muscle pains/cramps, and urination - excessive.         The pertinent positives and negatives are noted as above and in the HPI. All other ROS were reviewed and were negative.   Vital Signs:  Patient profile:   77 year old female Height:      69 inches Weight:      142.25 pounds Pulse rate:   60 / minute Pulse rhythm:   regular BP sitting:   120 / 60  (right arm) Cuff size:    regular  Vitals Entered By: June McMurray CMA Duncan Dull) (May 19, 2009 4:15 PM)  Physical Exam  General:  Well developed, well nourished, no acute distress. Head:  Normocephalic and atraumatic. Eyes:  PERRLA, no icterus. Mouth:  No deformity or lesions, dentition normal. Lungs:  Clear throughout to auscultation. Heart:  Regular rate and rhythm; no murmurs, rubs,  or bruits. Abdomen:  Soft, nontender and nondistended. No masses, hepatosplenomegaly or hernias noted. Normal bowel sounds. Psych:  Anxious and easily distracted.  Extremely anxious about health issues.  Impression & Recommendations:  Problem # 1:  BARRETT'S ESOPHAGUS (ICD-530.85) She is due for a one-year surveillance endoscopy. We discussed the low risk of malignant degeneration of Barrett's, approximately 1% over a lifetime of a Barrett's patient. I attempted to reassure her but she was tearful and very concerned about this diagnosis. I advised her to proceed with endoscopy and reassured her that the risk of degeneration of Barrett's esophagus was really quite well as long as she remains on a proton pump inhibitor and continuous surveillance endoscopy program. She decided not to schedule the procedure today and wanted to call back. The risks, benefits and alternatives to endoscopy with possible biopsy and possible dilation were discussed with the patient and they consent to proceed. The procedure will be scheduled electively when she schedules. I also advised her to remain on AcipHex 20 mg daily, taken 30 minutes before breakfast and standard antireflux measures as the has worked quite well for her for the past several months.  Problem # 2:  ANXIETY (ICD-300.00) She has a great deal of anxiety about her health conditions. She might benefit from SSRI or other therapy. Defer to Dr. Chilton Si for evaluation and treatment.  Patient Instructions: 1)  Please call us back to schedule Endoscopy.  2)  Upper Endoscopy brochure given.  3)  Copy  sent to : Varney Daily, MD 4)  The medication list was reviewed and reconciled.  All changed / newly prescribed medications were explained.  A complete medication list was provided to the patient / caregiver.

## 2010-04-09 NOTE — Progress Notes (Signed)
Summary: Triage   Phone Note Call from Patient Call back at Home Phone [phone]   Caller: Patient Call For: Dr. Russella Dar Reason for Call: Talk to Nurse Summary of Call: Pt. wants to discuss her colonoscopy that is scheduled for this month. Wants to know if it will be okay with "Dr. Russella Dar to postpone" until end of February.  Initial call taken by: Karna Christmas,  March 11, 2009 11:44 AM  Follow-up for Phone Call        Per office note 02-03-09, EGD was to be scheduled electively.  Patient  will call back to reschedule her EGD.  I have canceled EGD scheduled for 03-18-09 Follow-up by: Darcey Nora RN, CGRN,  March 11, 2009 1:27 PM

## 2010-04-09 NOTE — Progress Notes (Signed)
[facility]   Imported By: Sherian Rein 01/28/2010 13:40:20  _____________________________________________________________________  External Attachment:    Type:   Image     Comment:   External Document

## 2010-04-09 NOTE — Letter (Signed)
Summary: [facility]   Imported By: Lester Kosciusko 01/30/2010 12:08:27  _____________________________________________________________________  External Attachment:    Type:   Image     Comment:   External Document

## 2010-04-09 NOTE — Letter (Signed)
Summary: Office Visit Letter  [facility]  [address] [place], [place] [address]   Phone: [phone]  Fax: [phone]      September 02, 2009 MRN: [id number]   [facility] [address] [place], [place]  [address]   Dear Ms. Kazmierczak,   According to our records, it is time for you to schedule a follow-up office visit with [place].   At your convenience, please call [phone] (option #2)to schedule an office visit. If you have any questions, concerns, or feel that this letter is in error, we would appreciate your call.   Sincerely,  Judie Petit T. Russella Dar, M.D.  [facility] [phone]

## 2010-05-04 ENCOUNTER — Encounter: Payer: Self-pay | Admitting: Internal Medicine

## 2010-05-04 ENCOUNTER — Encounter (INDEPENDENT_AMBULATORY_CARE_PROVIDER_SITE_OTHER): Payer: Medicare Other

## 2010-05-04 DIAGNOSIS — I428 Other cardiomyopathies: Secondary | ICD-10-CM

## 2010-05-05 NOTE — Letter (Signed)
Summary: [facility]  [facility]   Imported By: Sherian Rein 04/27/2010 15:06:50  _____________________________________________________________________  External Attachment:    Type:   Image     Comment:   External Document

## 2010-05-14 NOTE — Procedures (Signed)
Summary: device check/sl  [facility]    Current Medications (verified): 1)  Lasix 40 Mg Tabs (Furosemide) .... 1/2 Tablet By Mouth Once Daily 2)  Potassium Chloride Crys Cr 20 Meq Cr-Tabs (Potassium Chloride Crys Cr) .... Once Daily 3)  Digoxin 0.125 Mg Tabs (Digoxin) .... One Tablet By Mouth Once Daily 4)  Synthroid 112 Mcg Tabs (Levothyroxine Sodium) .... Once Daily 5)  Mavik 2 Mg Tabs (Trandolapril) .... One Tablet By Mouth Two Times A Day 6)  Metoprolol Tartrate 25 Mg Tabs (Metoprolol Tartrate) .... Take 1/2 Tab By Mouth Once Daily 7)  Aspirin 81 Mg  Tabs (Aspirin) .... One Tablet By Mouth Once Daily 8)  Est Estrogens-Methyltest Hs 0.625-1.25 Mg Tabs (Est Estrogens-Methyltest) .... Once Daily 9)  Centrum Silver  Tabs (Multiple Vitamins-Minerals) .... Once Daily 10)  Vitamin C 1000 Mg Tabs (Ascorbic Acid) .... Take 2 Tablets Daily 11)  Citracal/vitamin D 250-200 Mg-Unit Tabs (Calcium Citrate-Vitamin D) .... Take Two By Mouth Once Daily 12)  Biotin 1000 Mcg Tabs (Biotin) .... Once Daily 13)  Aciphex 20 Mg Tbec (Rabeprazole Sodium) .... Take 1 Capsule By Mouth One Half Hour Before Breakfast  Allergies: 1)  ! Floxin 2)  ! Keflex 3)  ! Doxycycline 4)  ! Penicillin   ICD Specifications Following MD:  [name], MD     Referring MD:  Peter Swazil[place], MD ICD Vendor:  [facility] [employer]     ICD Model Number:  H210     ICD Serial Number:  [id number] ICD DOI:  05/14/2005      Lead 1:    Location: RA     DOI: 05/14/2005     Model #: [id number]     Serial #: [id number]     Status: active Lead 2:    Location: RV     DOI: 05/14/2005     Model #: [id number]     Serial #: [id number]     Status: active Lead 3:    Location: LV     DOI: 05/14/2005     Model #: 4543     Serial #: [id number]     Status: active  ICD Follow Up ICD Dependent:  No      Episodes Coumadin:  No  Brady Parameters Mode DDD     Lower Rate Limit:  55     Upper Rate Limit 130 PAV 140      Tachy Zones VF:  240     VT:  200     VT1:  170     Tech Comments:   see paceart report. Vella Kohler  May 04, 2010 3:57 PM

## 2010-05-19 NOTE — Cardiovascular Report (Signed)
Summary: Office Visit   Office Visit   Imported By: Roderic Ovens 05/15/2010 14:57:49  _____________________________________________________________________  External Attachment:    Type:   Image     Comment:   External Document

## 2010-05-23 LAB — COMPREHENSIVE METABOLIC PANEL
ALT: 17 U/L (ref 0–35)
AST: 21 U/L (ref 0–37)
Albumin: 3.8 g/dL (ref 3.5–5.2)
Alkaline Phosphatase: 83 U/L (ref 39–117)
BUN: 15 mg/dL (ref 6–23)
GFR calc Af Amer: >60 mL/min (ref >60)
Potassium: 4.9 mEq/L (ref 3.5–5.1)
Sodium: 130 mEq/L — ABNORMAL LOW (ref 135–145)
Total Protein: 6.1 g/dL (ref 6.0–8.3)

## 2010-05-23 LAB — CBC
Platelets: 202 10*3/uL (ref 150–400)
RBC: 4.22 MIL/uL (ref 3.87–5.11)
RDW: 13.2 % (ref 11.5–15.5)
WBC: 7 10*3/uL (ref 4.0–10.5)

## 2010-05-23 LAB — SURGICAL PCR SCREEN
MRSA, PCR: NEGATIVE
Staphylococcus aureus: NEGATIVE

## 2010-06-26 ENCOUNTER — Other Ambulatory Visit: Payer: Self-pay | Admitting: Cardiology

## 2010-06-26 DIAGNOSIS — I420 Dilated cardiomyopathy: Secondary | ICD-10-CM

## 2010-06-26 NOTE — Telephone Encounter (Signed)
escribe medication per fax request  

## 2010-07-21 NOTE — Assessment & Plan Note (Signed)
[facility]                         ELECTROPHYSIOLOGY OFFICE NOTE   NAME:[name], [name] GAINS                      MRN:          [id number]  DATE:11/07/2007                            DOB:          Jul 12, 1932    [name] returns today for followup.  She is a very pleasant woman  with a history of nonischemic cardiomyopathy and congestive heart  failure, and left bundle-branch block, severe LV dysfunction who is  status post BiV ICD insertion.  Her ejection fraction was initially 25%,  most recently it has been 40%.  She returns today for followup.  She  denies chest pain, she has dyspnea with exertion, though this is at most  Class II close to Class I.  She denies peripheral edema.   MEDICATIONS:  1. Lasix 40 a day.  2. Potassium 20 a day.  3. Digoxin 0.125 daily.  4. Levoxyl 112 mcg daily.  5. Mavik 2 mg twice daily.  6. Toprol-XL 12.5 mg daily.   PHYSICAL EXAMINATION:  GENERAL:  She is a pleasant well-appearing woman  in no acute distress.  VITAL SIGNS:  Blood pressure today is 104/53, the pulse 55 and regular,  respirations were 18.  The weight was 143 pounds.  NECK:  No jugular venous distention.  LUNGS:  Clear bilaterally to auscultation.  No wheezes, rales, or  rhonchi are present.  CARDIOVASCULAR:  Regular rate and rhythm.  Normal S1 and S2.  EXTREMITIES:  No edema.   Interrogation of the defibrillator demonstrates a Guidant contact H-110.  P-waves of 2, the R-waves of 12, the impedance is 706 in the A, 500 in  the RV, 1000 in the LV.  The threshold was 0.6 at 0.4 in RA, 0.8 at 0.4  in the RV, and 1.6 at 0.4 in the LV.   IMPRESSION:  1. Nonischemic cardiomyopathy.  2. Congestive heart failure.  3. Status post biventricular implantable cardioverter-defibrillator      insertion.   DISCUSSION:  [name] is stable.  She does complain of hair loss,  which is thought secondary to her beta blockers, but this is stable as  well.  Her  heart failure is well controlled.  I  have encouraged to get back to exercising by walking and take her  medicines to maintain a low-sodium diet.  I will see her back in the  year.     Doylene Canning. Ladona Ridgel, MD  Electronically Signed    GWT/MedQ  DD: 11/07/2007  DT: 11/08/2007  Job #: [id number]   cc:   Peter M. [place], M.D.

## 2010-07-21 NOTE — Assessment & Plan Note (Signed)
[name]                         ELECTROPHYSIOLOGY OFFICE NOTE   NAME:[name], [name] GRASSEL                      MRN:          [id number]  DATE:09/13/2006                            DOB:          Sep 04, 1932    [name] returns today for followup.  She is a very pleasant middle-  aged woman with a history of non-ischemic cardiomyopathy, congestive  heart failure and left bundle branch block, who is status post bi-V ICD  insertion.  Her procedure was complicated by left arm DVT.  She was  initially placed on Coumadin.  Her DVT is resolved.  The patient still  is anxious but overall has been stable.  She denies heart failure  symptoms.  She denies chest pain.  She is somewhat anxious about  ultimately having to have her generator removed and a new device placed.   PHYSICAL EXAMINATION:  GENERAL:  She is a pleasant, well-appearing woman  in no distress.  VITAL SIGNS:  Her blood pressure was 106/58.  The pulse 60 and regular.  Respirations are 18, the weight was 140 pounds.  NECK:  Revealed no jugular venous distention.  LUNGS:  Clear bilaterally to auscultation.  No wheezes, rales or rhonchi  are present.  CARDIOVASCULAR:  Revealed a regular rate and rhythm with normal S1 and  S2.  There are no murmurs, rubs or gallops.  EXTREMITIES:  Demonstrate no cyanosis, clubbing or edema.  The ICD  incision was healed nicely.   Interrogation of her defibrillator demonstrates a Guidant contact bi-V  device with P and R waves of 2 and 13 respectively.  The impedance is  706 in the atrium, 40 in the right ventricle, 1,000 in the left  ventricle, the threshold 0.8 at 0.4 in both the right atrium and the  right ventricle and 1.4 at 0.4 in the left ventricle.  Battery voltage  was 3.2 volts.  There are no intercurrent episodes of VT.   IMPRESSION:  1. Non-ischemic cardiomyopathy.  2. Congestive heart failure.  3. Left bundle branch block.  4. Status post bi-V ICD  insertion.   DISCUSSION:  Overall, [name] is stable and her defibrillator is  working normally.  She has had no ICD therapies.  I will plan to see her  back in the office in one year.     [name]. [name] Ridgel, MD  Electronically Signed    GWT/MedQ  DD: 09/13/2006  DT: 09/14/2006  Job #: [id number]   cc:   [name], M.D.

## 2010-07-24 NOTE — Discharge Summary (Signed)
NAMEODALYS, WIN               ACCOUNT NO.:  [id number]   MEDICAL RECORD NO.:  [id number]          PATIENT TYPE:  INP   LOCATION:  4702                         FACILITY:  [facility]   PHYSICIAN:  Doylene Canning. Ladona Ridgel, M.D.  DATE OF BIRTH:  Nov 26, 1932   DATE OF ADMISSION:  05/14/2005  DATE OF DISCHARGE:  05/15/2005                                 DISCHARGE SUMMARY   ALLERGIES:  Patient has allergy to ANTIBIOTIC, name is unknown; also  intolerance to BAND-AIDS.   PRINCIPAL DIAGNOSES:  1.  Discharging day #1, status post implantation of Guidant CRT-D a Contak      Renewal 3 RF cardioverter defibrillator with biventricular pacing      capability.  2.  Class III congestive heart failure.  3.  Nonischemic cardiomyopathy, ejection fraction 25% over prolonged period      of time despite maximum medical therapy.  4.  Left bundle branch block.  5.  Mitral regurgitation.   SECONDARY DIAGNOSES:  1.  Ongoing tobacco habituation.  2.  History of melanoma, status post resection.   PROCEDURE:  May 14, 2005, implantation of Guidant biventricular  cardioverter defibrillator.  It is a Chartered loss adjuster 3 RF model H210, by Dr.  [name].  There was defibrillator threshold study less than or equal to  23 joules.  There was successful right atrial, right ventricular and left  ventricular pacing and sensing.   BRIEF HISTORY:  Ms. Rivkin is a 77 year old female initially seen by Dr.  Ladona Ridgel August 2006, for consideration of an implantable cardioverter  defibrillator.  At that time, she had a history of congestive heart failure,  class III, despite maximal medical therapy.  She had a nonischemic  cardiomyopathy, ejection fraction 25%.  She had left bundle branch block.  She also had mitral regurgitation.  She has been on maximal medical therapy  since that time.  A repeat echocardiogram  shows ejection fraction remains  25%.  Her heart failure is still class III.  The patient wishes at this time  to  reconsider consideration of Bi Ventricular cardioverter defibrillator.  The risks and benefits of this defibrillator implanted for primary  prevention of sudden cardiac death have been described to the patient.  She  wishes to proceed.   HOSPITAL COURSE:  Patient presented electively May 14, 2005, to [place]  [facility].  She underwent implantation of a Guidant Contak  Renewal 3 RF cardioverter defibrillator with left ventricular lead placement  by Dr. [name].  She had no post procedural complications.  There was  evident pacing involving the QRS with  hopeful implosion of the QRS prior to  the pacing was measured at 172 msec.  At the time of discharge, her incision  was healing nicely without hematoma.  She was alert and oriented x3.  Mobility of the left arm has been discussed with the patient.  She will  continue her preoperative medications, follow up at [facility] Wednesday, June 02, 2005, at 9:00.  She will see Dr. Ladona Ridgel in June  2007, his office will call with  an appointment.  She is to continue  following a low sodium, low cholesterol diet.  She is asked not to drive for  one week. She is asked not to lift anything heavier than 10 pounds for four  weeks.  She is to keep her incision dry for the next seven days and sponge  bathe until Friday May 21, 2005.   DISCHARGE MEDICATIONS:  1.  Lasix 40 mg daily.  2.  K-Dur 20 mEq daily.  3.  Digitek 125 mcg daily.  4.  Estratest daily.  5.  Levoxyl 112 mcg daily.  6.  Toprol XL 25 mg daily.  7.  Mavik 2 mg twice daily.   LABORATORY STUDIES:  Complete blood count with white cells 6.6, hemoglobin  14.2, hematocrit 41.4 and platelets 243.  The PT is 12.1, INR 1, serum  electrolytes with sodium 141, potassium 4.3, chloride 102, carbonate 30,  glucose 76, BUN 11, creatinine 0.7.      Maple Mirza, P.A.    ______________________________  Doylene Canning. Ladona Ridgel, M.D.    GM/MEDQ  D:   05/14/2005  T:  05/17/2005  Job:  [id number]   cc:   Peter M. [place], M.D.  Fax: [phone]

## 2010-07-24 NOTE — Discharge Summary (Signed)
NAMEJILLIANE, [name]               ACCOUNT NO.:  [id number]   MEDICAL RECORD NO.:  [id number]          PATIENT TYPE:  INP   LOCATION:  3732                         FACILITY:  [facility]   PHYSICIAN:  Willa Rough, M.D.     DATE OF BIRTH:  1932-03-15   DATE OF ADMISSION:  08/13/2004  DATE OF DISCHARGE:  08/15/2004                                 DISCHARGE SUMMARY   PRIMARY CARE PHYSICIAN:  Lenon Curt. Chilton Si, MD.   CARDIOLOGIST:  Willa Rough, MD.   DISCHARGING DIAGNOSES:  1.  Congestive heart failure; patient with a known ejection fraction of 20%;      BNP 991+ in the emergency room on initial admission.  2.  Melanoma.  3.  Palpitations.  4.  Left bundle branch block.  5.  Severe mitral regurgitation.  6.  Severe left ventricular dysfunction.  7.  Normal coronaries by cardiac catheterization.  8.  Ethanol abuse in the past.  9.  Tobacco abuse in the past.  10. Depression/anxiety.  11. Chronic abdominal pain, status post colonoscopy in 2004 with      diverticulitis and hyperplastic rectal polyps.  12. Hypothyroidism.  13. Hyperlipidemia.   DISPOSITION:  Patient being discharged home with the addition of Lasix 40 mg  p.o. daily.   She is to continue her previous medications including:  1.  Coreg 3.125 mg p.o. b.i.d.  2.  Synthroid 112 mcg.  3.  Fluconazole 100 mg.  4.  Estratest as previously taken.  5.  Nexium as previously taken.  6.  Potassium 20 mEq p.o. daily.  7.  Digoxin 0.125 mg p.o. daily.   Patient has been given prescription for the Digoxin, Coreg, Lasix, and  potassium.   FOLLOWUP:  She is to follow up with Dr. Myrtis Ser within the next two to three  weeks.  Their office will call her for an appointment.  She is to follow up  with Dr. Chilton Si as previously scheduled by patient.  She will also need a  chest x-ray repeated outpatient.  I have also given her a heart failure  discharge information contract reviewing her medications, activity, what to  do if her condition  changes, and followup care with Dr. Myrtis Ser.  She has also  been given a Take Heart With Heart Failure article to read to reinforce her  CHF education.   HOSPITAL COURSE:  [name] is a 77 year old, Caucasian female followed by  Dr. Myrtis Ser, last seen at our office on Aug 05, 2004, at which time Dr. Myrtis Ser  examined patient and reviewed patient's medications with her.  He encourage  the patient to start the Lexapro that had been recommended as patient's  depression was playing a role in making her feel worse; however, she has  significant medical problems anyway.  On this admission, the patient  continued to complain of shortness of breath and insomnia, and she decided  to go ahead and come into the emergency room and get evaluated.  She was  admitted under the care of Dr. Myrtis Ser and diuresed with IV Lasix.  The patient  responded well to  medications.  She was also started on Digoxin as needed  for her CHF.  The patient also had a nutritional consult this admission to  review diet and sodium with CHF.  Dr. Myrtis Ser did see the patient on the day of  discharge, and patient feeling much better.  On 24-hour I's and O's, the  patient diuresed almost six liters, potassium stable at 3.7, BUN 11,  creatinine 0.8.   PLAN:  The plan is to discharge the patient home on the 40 mg of Lasix, and  a follow up chest x-ray secondary to chest film that was done this morning  showing results as stated: Improved aeration at bases with small bilateral  effusions.  Opacity in right apex may be scarring, but followup chest x-ray  recommended.      Mic   MB/MEDQ  D:  08/15/2004  T:  08/15/2004  Job:  [id number]

## 2010-07-24 NOTE — Op Note (Signed)
[name], [name]                         ACCOUNT NO.:  [id number]   MEDICAL RECORD NO.:  [id number]                   PATIENT TYPE:  OIB   LOCATION:  2550                                 FACILITY:  [facility]   PHYSICIAN:  Rose Phi. Maple Hudson, M.D.                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   OPERATION PERFORMED:  Completion of left axillary lymph node dissection.   SURGEON:  Rose Phi. Maple Hudson, M.D.   ANESTHESIA:  General.   DESCRIPTION OF PROCEDURE:  This 77 year old female had presented with a  melanoma of the left upper arm which was 1.25 mm in thickness.  She had a  wide excision of sentinel lymph node biopsy.  On the immunohistochemical  stains of the sentinel node, it turned out she had some metastatic cells and  we are now doing a completion of left axillary lymph node dissection.   After suitable general endotracheal anesthesia was induced, the patient was  placed in the supine position with the left arm extended on the arm board.  The left axilla was then prepped and draped in standard fashion.  We then  made a longer axillary incision incorporating the sentinel node incision and  dissected through the subcutaneous tissue to the clavipectoral fascia.  I  identified the pectoralis major muscle anteriorly and the latissimus dorsi  muscle posteriorly.  We then dissected along the pectoralis major muscle  retracting it and exposing the pectoralis minor which we then incised along  and retracted it and exposed the axillary vein.  I then took all the tissue  inferior to the vein and from beneath the pectoralis minor muscle so that we  got a full level 1 and level 2 node dissection.  All of this tissue was then  removed.  The long thoracic and  thoracodorsal nerves were identified and  preserved and the other vessels and nerves were clipped and divided.  Because this was the only immunohistochemical stain positive node, we did  not try to do a level 3 node dissection.  Following the completion of this,  we had good hemostasis.  We  thoroughly irrigated the field with saline.  A 19 [place] Blake drain was  inserted and brought through a separate stab wound.  Subcutaneous tissue was  closed with 3-0 Vicryl and the skin with staples.  Dressing applied.  The  patient was then transferred to the recovery room in satisfactory condition  having tolerated the procedure well.  Rose Phi. Maple Hudson, M.D.    PRY/MEDQ  D:  09/17/2002  T:  09/17/2002  Job:  [id number]   cc:   Lenon Curt Chilton Si, M.D.  [address]  [place]  [place] [address]  Fax: [phone]   Marvis Repress. Earlene Plater, RES

## 2010-07-24 NOTE — Op Note (Signed)
NAMEHIROMI, [name]                         ACCOUNT NO.:  [id number]   MEDICAL RECORD NO.:  [id number]                   PATIENT TYPE:  OUT   LOCATION:  NUC                                  FACILITY:  [facility]   PHYSICIAN:  Rose Phi. Maple Hudson, M.D.                DATE OF BIRTH:  06-Sep-1932   DATE OF PROCEDURE:  08/27/2002  DATE OF DISCHARGE:  08/13/2002                                 OPERATIVE REPORT   PREOPERATIVE DIAGNOSIS:  Melanoma, left upper arm.   POSTOPERATIVE DIAGNOSIS:  Melanoma, left upper arm.   OPERATION PERFORMED:  1. Blue dye injection.  2. Left axillary sentinel lymph node biopsy.  3. Wide excision of melanoma of the left upper arm.   SURGEON:  Rose Phi. Maple Hudson, M.D.   ANESTHESIA:  General.   INDICATIONS FOR PROCEDURE:  This patient had presented with a melanoma of  the left upper arm which measured 1.25 mm thickness without ulceration.  A  preoperative lymphoscintigram had shown that her drainage basin for the  lymph nodes was in the left axilla.  She was then scheduled for a wide  excision and sentinel node biopsy.   DESCRIPTION OF PROCEDURE:  Prior to coming to the operating room technetium  sulfur colloid was injected intradermally around the melanoma site on the  left upper arm.  After suitable general anesthesia was induced, the patient  was placed in supine position.  About 0.79mL of Lymphazurin blue was injected  intradermally.  We then prepped the arm, the left breast and axilla.  After  draping this, a short axillary incision was made and dissection through the  subcutaneous tissue to the clavipectoral fascia.  Just deep to the fascia  was a blue and hot lymph node which was excised as a sentinel node.  There  were no other palpable blue or hot lymph nodes.  That incision was then  closed with 3-0 Vicryl and subcuticular 4-0 Monocryl and Steri-Strips.   We then rotated the arm and I outlined a longitudinal incision incorporating  the melanoma biopsy  site.  We measured a 1 cm margin around it.  After  excising this down to the fascia, it created a defect that measured 8 x 3.5  cm.  Incision was then closed in layers using 3-0 Vicryl and 4-0 nylon.  Dressing was applied.  Patient transferred to the recovery room in  satisfactory condition having tolerated the procedure well.                                                Rose Phi. Maple Hudson, M.D.    PRY/MEDQ  D:  08/27/2002  T:  08/28/2002  Job:  [id number]   cc:   Marvis Repress. Earlene Plater, RES   Lenon Curt. Chilton Si, M.D.  [address]  [place]  [place] [address]  Fax: [phone]

## 2010-07-24 NOTE — Op Note (Signed)
[name], [name]               ACCOUNT NO.:  [id number]   MEDICAL RECORD NO.:  [id number]          PATIENT TYPE:  INP   LOCATION:  4702                         FACILITY:  [facility]   PHYSICIAN:  Doylene Canning. Ladona Ridgel, M.D.  DATE OF BIRTH:  January 04, 1933   DATE OF PROCEDURE:  05/14/2005  DATE OF DISCHARGE:  05/15/2005                                 OPERATIVE REPORT   PROCEDURE PERFORMED:  Implantation of dual-chamber biventricular implantable  cardioverter-defibrillator.   CARDIOLOGIST:  Doylene Canning. Ladona Ridgel, M.D.   INDICATIONS:  Nonischemic cardiomyopathy (greater than 2 years) with an EF  of 25%, class III heart failure, left bundle branch block.   INTRODUCTION:  The patient is a very pleasant 77 year old woman with a  history of congestive heart failure, mitral regurgitation and left bundle  branch block.  She has an EF of 25%.  Despite maximal medical therapy, she  has not had improvement in her EF, and she is now referred for bi-V ICD  implantation.   DESCRIPTION OF PROCEDURE:  After informed consent was obtained, the patient  was taken to the diagnostic EP lab in the fasting state.  After the usual  preparation and draping, intravenous fentanyl and midazolam was given for  sedation.  Lidocaine 30 mL was infiltrated in the left infraclavicular  region.  A 9 cm incision was carried out over this region.  Electrocautery  was utilized to dissect down to the fascial plane.  The left subclavian vein  was demonstrated to be patent after 10 cc of contrast ejected into it.  The  Guidant model [id number] defibrillation lead was advanced into the right  ventricle and on the RV septum.  The Guidant model [id number] active  fixation pacing lead was advanced in the right atrium.  Mapping was carried  out in the right ventricle, and the final site the R-waves measured 19 mV,  and the pace impedance was 626 ohms.  The pacing threshold was 0.7 volts,  0.5 milliseconds.  With the defibrillation lead in  satisfactory position, 10  volts pacing was ________the diaphragm.  Attention. Was then turned to  placement of the atrial lead which was placed in the remnant of the right  appendage where P-waves measured 2.5 mV and the pacing impedance was 656  ohms.  Pacing thresholds was at 1.5 volts and 0.5 milliseconds.  Again 10  volt pacing ________ at the diaphragm.  At this point, with both right  atrial and right ventricular lead in satisfactory position and actively  fixed, attention was then turned to placement of the left ventricular lead.  The coronary sinus was cannulated with a guiding catheter and the venography  was carried out.  This demonstrated a positive lateral vein.  However, the  high lateral vein was subsequently cannulated and the Guidant Easy tract  guide 2IS1, model 4543 passive fixation pacing lead, serial number [id number]  was advanced into the lateral vein in satisfactory position.  The LV  threshold was 1.5 volts at 0.5 milliseconds, and 8 volt pacing did not  stimulate the diaphragm.  The pacing impedance was 1136 ohms.  With the  leads now in satisfactory position, they were secured to subpectoralis  fascia with a silk suture.  The sewing sleeve was also secured with silk  suture.  At this point, electrocautery was utilized to make a subcutaneous  pocket.  Kanamycin irrigation was utilized to irrigate the pocket, and  electrocautery utilized for hemostasis.  The Guidant contact renewal III RF  model H110, serial number [id number] was connected to the right atrial, right  ventricular, and left ventricular leads and placed back in the subcutaneous  pocket.  Generator was secured with silk suture.  The patient was more  deeply sedated and defibrillation threshold testing carried out.   After the patient was deeply sedated with fentanyl and Versed, VF was  induced with T-wave shock and initial 14 joules shock was delivered which  failed to terminate VF.  The device redetected and  recharged and a 22 joule  shock was subsequently delivered, terminating in ventricular fibrillation  and restoring sinus rhythm.  Five minutes was allowed to lapse and second a  DFT test carried out.  Again VF was induced with T-wave shock and again a 23  joule shock was delivered which terminated VF and restored sinus rhythm.  At  this point, no additional defibrillation threshold testing was carried out  and the incision was closed with a layer of 2-0 Vicryl followed by the 3-0  Vicryl followed by a layer of 4-0 Vicryl.  Benzoin was painted on the skin.  Steri-Strips were applied, and a pressure strip placed.  The patient was  returned to her room in satisfactory condition.   COMPLICATIONS:  There were no immediate procedure complications.   RESULTS:  This demonstrates successful implantation of a Guidant  biventricular defibrillator in a patient with a nonischemic cardiomyopathy,  left bundle branch block, class III heart failure, ejection fraction of 25%.  Of note, the patient was enrolled in the D-code study.           ______________________________  Doylene Canning. Ladona Ridgel, M.D.     GWT/MEDQ  D:  05/14/2005  T:  05/15/2005  Job:  [id number]   cc:   Peter M. [place], M.D.  Fax: [phone]   Willa Rough, M.D.  [address]  [address]  [place]  [place] [address]

## 2010-07-24 NOTE — H&P (Signed)
NAMECAITLYNN, [name]:  [id number]   MEDICAL RECORD NO.:  [id number]          [name] TYPE:  EMS   LOCATION:  ED                           FACILITY:  [facility]   PHYSICIAN:  Carleene Cooper, M.D.    DATE OF BIRTH:  May 08, 1932   DATE OF ADMISSION:  08/13/2004  DATE OF DISCHARGE:                                HISTORY & PHYSICAL   PRIMARY CARE PHYSICIAN:  Lenon Curt. Chilton Si, M.D.   CARDIOLOGIST:  Willa Rough, M.D.   GI PHYSICIAN:  Venita Lick. Russella Dar, M.D.   ONCOLOGIST:  Rose Phi. Myna Hidalgo, M.D.   SUMMARY OF HISTORY:  [name] is a 77 year old white female who is  referred to [name] emergency room by Dr. Ardell Isaacs nurse secondary to shortness  of breath.  [name] describes a several month history of insomnia where she  only gets one to two hours of sleep per night.  She also describes shortness  of breath that may occur any time at rest, nocturnally or exertional.  Occasionally, it will be relieved with stopping activity, trying to relax,  deep breathing, sitting up if lying down.  Symptoms may last minutes to  hours.  She is not sure of any aggravating factors.  She has attributed this  to [name] medication, Coreg, that Dr. Myrtis Ser started back in March for her  diagnosis of dilated cardiomyopathy.  She also described occasionally fizzy  feeling in her chest and chronic abdominal discomfort.  She denies any  weight gain or pedal edema.  It is difficult to discern if she has any real  orthopnea or PND.  She also complains of a dry cough.   Yesterday, she had an EGD done by Dr. Russella Dar to further evaluate her  abdominal discomfort.  Based on a copy of  [name] report, she is diagnosed with  erosions and she was prescribed Nexium and Fluconazole.  However, last night  she was unable to sleep at all.  She feels that her shortness of breath was  much worse last night and this morning.  She called Dr. Ardell Isaacs nurse who  stated that she should be seen by a cardiologist and if she could  not be  seen, she needs to come to [name] emergency room.  Thus, her presentation.   PAST MEDICAL HISTORY:  1.  Allergies include:      1.  FLOXIN.      2.  KEFLEX.      3.  DOXYCYCLINE.      4.  PENICILLIN.      5.  AMOXICILLIN      6.  MIACINS.  2.  Her medications include:      1.  Coreg 3.125 mg b.i.d.      2.  Synthroid 112 mcg daily.      3.  Estratest H.S. 6.25 mg daily.      4.  Multivitamin.      5.  Nexium 40 mg daily.      6.  Fluconazole 100 mg daily.      7.  Vitamin E, C and calcium of unknown dosages.  3.  Her medical history is notable for:      1.  Melanoma with surgical excision in her left upper extremity          including node dissection in July 2004.      2.  She has chronic abdominal discomfort and was diagnosed with          diverticulosis by colonoscopy in 2004 with hyperplastic rectal          polyps.      3.  She also has a history of depression anxiety.      4.  History of hypothyroidism.  She states her last TSH was in March          2005 and was okay.      5.  History of hyperlipidemia in March 2006.  LDL was 111, HDL 71,          triglycerides 58.      6.  Chronic left bundle branch block, ventricular bigeminy.      7.  Medical noncompliance in that in [name] past [name] has stopped and          started medications on her own accord.      8.  Diagnosis of dilated cardiomyopathy with severe MR.  Echocardiogram          on June 10, 2004, Dr. Henrietta Hoover office showed severe MR, inferoapical          hypokinesis, septal apical akinesis with EF of approximately 20%.          Catheterization was performed on July 02, 2004, to further evaluate          abnormal echocardiogram.  This showed an EF of 20%, 3 to 4+ MR.  She          did not have any coronary artery disease.  4.  Surgical history:      1.  Hysterectomy in 1976.      2.  Breast biopsy in 1988.   SOCIAL HISTORY:  She resides in [place], [place], alone.  She has  never married.  She has  no children.  She is accompanied by her significant  other.  He did not offer his name.  She is retired from [name].  She quit  smoking approximately eight months ago.  Prior to that, she was smoking half  a pack per day for at least 20 years.  She also quit drinking alcohol four  months ago.  She is very vague as to her prior use, however, Dr. Myrtis Ser  reported that she was drinking two to three vodkas per day.  She denies any  drug use, herbal medication use.  She is not watching a diet in regard to  her diverticulosis and her significant other states that she does not eat a  lot of salt.  She has not exercised since her diagnosis.   FAMILY HISTORY:  Noncontributory.   REVIEW OF SYMPTOMS:  She feels that she has gained five pounds over [name] last  three weeks but she also states that prior to that she was trying to drop  some pounds and she had lost 10 pounds.  She wears glasses.  Occasional  lightheadedness.  Menopause.  Rare nocturia.  Generalized weakness,  especially in her legs, in addition to [name] above.   PHYSICAL EXAMINATION:  GENERAL APPEARANCE:  Well-nourished, well-developed,  pleasant white female in no apparent distress.  VITAL SIGNS:  Temperature 97.4, blood pressure  117/81, pulse 66 and regular,  respirations 20, 98% sat on room air.  HEENT:  Essentially unremarkable.  NECK:  Supple without thyromegaly, adenopathy or carotid bruits.  She does  have approximately 8 cm of JVD.  CHEST:  Symmetrical excursion.  Diminished breath sounds especially in  bilateral bases.  CARDIOVASCULAR:  PMI is not displaced.  Regular rate and rhythm.  Do not  appreciate an S3 or an S4.  She does have a 2/6 systolic murmur best  appreciated at [name] apex radiating through [name] precordium.  All pulses are  symmetric and intact without femoral bruits or abdominal bruits.  SKIN:  Integument was intact without rashes or lesions. ABDOMEN:  Bowel sounds present without abdominal bruits, without   organomegaly, masses or tenderness.  EXTREMITIES:  Negative clubbing, cyanosis, or edema.  Peripheral pulses  symmetrical and intact.  MUSCULOSKELETAL:  Unremarkable.  NEUROLOGIC:  Unremarkable.   Chest x-ray showed bilateral two densities, small effusions, pneumonia  versus atypical CHF.  Spiral CT did not show any evidence of pulmonary  embolism dissection.  She had moderate to large bilateral pleural effusions,  no signs of pneumonia or active disease.   An EKG showed sinus rhythm with bigeminy.  Left axis deviation.  Left bundle  branch block with a rat of 80.  This has changed from an EKG available on  August 09, 2004, but remains unchanged from Dr. Henrietta Hoover descriptions in his  office notes.   H&H is 13.7 and 41.0, normal indices, platelets 221, wbc 7.5.  Sodium 136,  potassium 4.3, BUN 13, creatinine 0.8, glucose 108.  AST and ALT were  slightly elevated at 83 and 131.  BNP was elevated at 991.  D-dimer was  elevated at 1.78.   IMPRESSION:  1.  Congestive heart failure with moderate to large bilateral pleural      effusions on her chest CT and elevated BNP.  It is noted [name]      appears to have very poor insight into her disease process.  2.  Elevated liver function tests probably secondary to congestion from her      congestive heart failure.  3.  Elevated D-dimer. CT was negative for pulmonary embolism.  4.  History as previously described.   DISPOSITION:  I have given [name] and her significant other information  at [name] ER in regards to CHF.  Her significant other  read [name] information and seemed to have a better understanding of his  partner's illness.  However, [name], [name], refused to read it.  Dr. Gerri Spore reviewed [name]'s history, spoke with and examined [name]  [name] and agrees with [name] above.  We will admit to [facility] for diuresis with daily weights and I&Os.  [name] needs  education for  better insight into her disease process.  We will check a TSH  as well as a PT and PTT and enzymes.  We will continue her current  medications and add Lasix for diuresis and candesartan and continue to  follow her blood pressure closely.  As noted from office notes, Dr. Henrietta Hoover  initial plans were to reevaluate her LV function in several months once she  was on medications and  she had discontinued alcohol to see if there was any improvement.  Will  discuss with Dr. Gerri Spore about obtaining an echocardiogram  and if her LV  function has not shown any improvement, consider more aggressive medical  management  as well as ICD placement.      Irving Burton   EW/MEDQ  D:  08/13/2004  T:  08/13/2004  Job:  [id number]   cc:   Lenon Curt. Chilton Si, M.D.  [address]  [place]  [place] [address]  Fax: [phone]   Willa Rough, M.D.   Venita Lick. Russella Dar, M.D. [facility] R. Myna Hidalgo, M.D.  [address] [facility] [place], [place] [address]  Fax: [phone]

## 2010-07-24 NOTE — Cardiovascular Report (Signed)
NAMEROBERTA, ANGELL               ACCOUNT NO.:  [id number]   MEDICAL RECORD NO.:  [id number]          PATIENT TYPE:  OIB   LOCATION:  6501                         FACILITY:  [facility]   PHYSICIAN:  Salvadore Farber, M.D. LHCDATE OF BIRTH:  Mar 12, 1932   DATE OF PROCEDURE:  07/02/2004  DATE OF DISCHARGE:                              CARDIAC CATHETERIZATION   PROCEDURE:  Left and right heart catheterization, left ventriculography and  coronary angiography.   INDICATIONS FOR PROCEDURE:  [name] is a 77 year old lady with very  heavy alcohol use who presents with dyspnea and left bundle branch block.  On echocardiogram  she has been found to have severe mitral regurgitation  with an ejection fraction of 20%.  She is referred for right and left heart  catheterization to assess pressures and exclude coronary disease as a  contributor to her left ventricular systolic dysfunction.   DESCRIPTION OF PROCEDURE:  Informed consent was obtained.  Under 1%  lidocaine local anesthesia, a 4 French sheath was placed in the right common  femoral artery and a 7 French sheath in the right common femoral vein using  the modified Seldinger technique.  Balloon tipped catheter was advanced via  the venous sheath into pulmonary capillary wedge position.  Pressures were  measured.  Cardiac output was measured using the Fick technique.  Left heart  catheterization was then performed.  Ventriculography was performed by power  injection.  Finally, coronary angiography was performed using JL4 and JR4  catheters.  The patient tolerated the procedure well and was transferred to  the holding room in stable condition.   COMPLICATIONS:  None.   FINDINGS:  1.  Hemodynamics:  RA 13/12/10, RV 49/9/15, PA 56/27/42, PCW 26 with V-waves      to 45, LV 123/16/28, aorta 120/76/95, cardiac output/index 3.8/2.0.      There is no aortic stenosis or mitral stenosis.   VENTRICULOGRAPHY:  EF approximately 20% with 3 to 4+  mitral regurgitation.  Both the left atrium and left ventricle are dilated.  Strictly speaking, the  mitral regurgitation is 3+.  However, given the very dilated left atrium, I  think that the mitral regurgitation is in fact worse than that.   CORONARY ANGIOGRAPHY:  1.  Left main:  Angiographically normal.  2.  LAD:  Moderate size vessel giving rise to three medium size diagonals.      It is angiographically normal.  3.  Circumflex:  Relatively small vessel giving rise to one blanching obtuse      marginal.  It is angiographically normal.  4.  RCA:  Moderate size dominant vessel.  It is angiographically normal.   IMPRESSION/RECOMMENDATION:  Patient has severe dilated cardiomyopathy with  severe mitral regurgitation.  There is no coronary artery disease.  Will  initiate therapy with ACE inhibitor.  Abstinence from alcohol is advised.  Patient will follow up with Dr. Myrtis Ser for discussions regarding further care.      WED/MEDQ  D:  07/02/2004  T:  07/02/2004  Job:  [id number]   cc:   Erskine Speed, M.D.  [address]  [address]  [place]  [place] [address]  Fax: [phone]   Willa Rough, M.D.

## 2010-08-05 ENCOUNTER — Ambulatory Visit (INDEPENDENT_AMBULATORY_CARE_PROVIDER_SITE_OTHER): Payer: Medicare Other | Admitting: *Deleted

## 2010-08-05 DIAGNOSIS — Z95810 Presence of automatic (implantable) cardiac defibrillator: Secondary | ICD-10-CM

## 2010-08-05 DIAGNOSIS — I5022 Chronic systolic (congestive) heart failure: Secondary | ICD-10-CM

## 2010-08-05 DIAGNOSIS — I428 Other cardiomyopathies: Secondary | ICD-10-CM

## 2010-08-11 ENCOUNTER — Other Ambulatory Visit: Payer: Self-pay | Admitting: Cardiology

## 2010-08-11 NOTE — Telephone Encounter (Signed)
escribe medication per fax request  

## 2010-09-01 ENCOUNTER — Telehealth: Payer: Self-pay | Admitting: Cardiology

## 2010-09-01 DIAGNOSIS — I420 Dilated cardiomyopathy: Secondary | ICD-10-CM

## 2010-09-01 NOTE — Telephone Encounter (Signed)
Wants to schedule her Echo for August and then see Dr. Swaziland. Scheduled Echo for 8/21 and then see Dr. Swaziland 8/31.

## 2010-09-01 NOTE — Telephone Encounter (Signed)
Wants to know if RN can schedule her yearly echocardiogram for August and make her appointment with Dr.Jordan for after that to go over results.

## 2010-09-08 ENCOUNTER — Other Ambulatory Visit: Payer: Self-pay | Admitting: Cardiology

## 2010-09-08 NOTE — Telephone Encounter (Signed)
Med refill

## 2010-10-06 ENCOUNTER — Other Ambulatory Visit: Payer: Self-pay | Admitting: Cardiology

## 2010-10-19 ENCOUNTER — Telehealth: Payer: Self-pay | Admitting: Cardiology

## 2010-10-19 ENCOUNTER — Telehealth: Payer: Self-pay | Admitting: Internal Medicine

## 2010-10-19 NOTE — Telephone Encounter (Signed)
Per pt call. Pt is suppose to have cataract surgery on Wednesday and pt is terrified b/c she has defibrillator. Pt wanted to check w/ Dr. Ladona Ridgel that it is ok to proceeded with surgery. Please return pt call to reassure it is ok to continue with surgery.

## 2010-10-19 NOTE — Telephone Encounter (Signed)
Surgical care requests last office visit and pacemaker check to be sent to them

## 2010-10-19 NOTE — Telephone Encounter (Signed)
Faxed last OV note only to [phone], pacemaker checks not available at [facility], they are done at [facility], faxed OV Note today

## 2010-10-19 NOTE — Telephone Encounter (Signed)
Ok to have cataract surgery done  Please call back with further questions

## 2010-10-21 ENCOUNTER — Encounter: Payer: Self-pay | Admitting: Internal Medicine

## 2010-10-23 ENCOUNTER — Encounter: Payer: Self-pay | Admitting: Cardiology

## 2010-10-27 ENCOUNTER — Other Ambulatory Visit (HOSPITAL_COMMUNITY): Payer: Medicare Other | Admitting: Radiology

## 2010-10-27 ENCOUNTER — Ambulatory Visit (HOSPITAL_COMMUNITY): Payer: Medicare Other | Attending: Cardiology | Admitting: Radiology

## 2010-10-27 DIAGNOSIS — I080 Rheumatic disorders of both mitral and aortic valves: Secondary | ICD-10-CM | POA: Insufficient documentation

## 2010-10-27 DIAGNOSIS — I428 Other cardiomyopathies: Secondary | ICD-10-CM

## 2010-10-27 DIAGNOSIS — I079 Rheumatic tricuspid valve disease, unspecified: Secondary | ICD-10-CM | POA: Insufficient documentation

## 2010-10-27 DIAGNOSIS — I420 Dilated cardiomyopathy: Secondary | ICD-10-CM

## 2010-10-27 DIAGNOSIS — I509 Heart failure, unspecified: Secondary | ICD-10-CM

## 2010-10-29 ENCOUNTER — Telehealth: Payer: Self-pay | Admitting: *Deleted

## 2010-10-29 NOTE — Telephone Encounter (Signed)
Notified of Echo results

## 2010-10-29 NOTE — Telephone Encounter (Signed)
Message copied by Lorayne Bender on Thu Oct 29, 2010  3:49 PM ------      Message from: [place], PETER M      Created: Tue Oct 27, 2010  5:10 PM       LV function looks great. Mild to moderate MR is stable. Great news!

## 2010-11-06 ENCOUNTER — Ambulatory Visit (INDEPENDENT_AMBULATORY_CARE_PROVIDER_SITE_OTHER): Payer: Medicare Other | Admitting: Cardiology

## 2010-11-06 ENCOUNTER — Encounter: Payer: Self-pay | Admitting: Cardiology

## 2010-11-06 DIAGNOSIS — I428 Other cardiomyopathies: Secondary | ICD-10-CM

## 2010-11-06 DIAGNOSIS — I5022 Chronic systolic (congestive) heart failure: Secondary | ICD-10-CM

## 2010-11-06 NOTE — Patient Instructions (Signed)
Let's stop the Lasix and potassium.  Continue your other medications.  I will see you again in 6 months.

## 2010-11-06 NOTE — Progress Notes (Signed)
[name] Date of Birth: August 27, 1932   History of Present Illness: [name] is seen today for followup of her cardiomyopathy. She has been doing very well from a cardiac standpoint. She denies any symptoms of dyspnea, edema, orthopnea, or chest pain. She has had no palpitations.  Current Outpatient Prescriptions on File Prior to Visit  Medication Sig Dispense Refill  . Ascorbic Acid (VITAMIN C) 1000 MG tablet Take 2,000 mg by mouth daily.        Marland Kitchen aspirin 81 MG tablet Take 81 mg by mouth daily.        . Biotin 1000 MCG tablet Take 1,000 mcg by mouth daily.        . Calcium Citrate-Vitamin D (CITRACAL/VITAMIN D) 250-200 MG-UNIT TABS Take 2 tablets by mouth daily.        . digoxin (LANOXIN) 0.125 MG tablet TAKE 1 TABLET BY MOUTH EVERY DAY  60 tablet  5  . estrogen-methylTESTOSTERone (ESTRATEST HS) 0.625-1.25 MG per tablet Take 1 tablet by mouth daily.        . furosemide (LASIX) 20 MG tablet Take 1 tablet (20 mg total) by mouth daily.      Marland Kitchen KLOR-CON M20 20 MEQ tablet TAKE 1 TABLET BY MOUTH EVERY DAY  60 tablet  5  . levothyroxine (SYNTHROID, LEVOTHROID) 112 MCG tablet Take 112 mcg by mouth daily.        . metoprolol succinate (TOPROL-XL) 25 MG 24 hr tablet Take 25 mg by mouth daily. 1/2 po daily       . Multiple Vitamins-Minerals (CENTRUM SILVER PO) Take 1 tablet by mouth daily.        . Potassium Chloride (KCL-20 PO) Take by mouth daily.        . RABEprazole (ACIPHEX) 20 MG tablet Take 20 mg by mouth daily. 1/2 hour before breakfast       . trandolapril (MAVIK) 2 MG tablet TAKE 1 TABLET BY MOUTH TWICE A DAY  60 tablet  5  . metoprolol tartrate (LOPRESSOR) 25 MG tablet TAKE 1/2 TABLET BY MOUTH EVERY DAY  15 tablet  5  . DISCONTD: furosemide (LASIX) 40 MG tablet Take 1 tablet dailey or as directed  60 tablet  3    Allergies  Allergen Reactions  . Amoxicillin   . Cephalexin   . Doxycycline   . Keflex   . Ofloxacin   . Penicillins     Past Medical History  Diagnosis Date  .  Nonischemic cardiomyopathy   . CHF (congestive heart failure)   . LBBB (left bundle branch block)   . Mitral regurgitation   . Melanoma   . Hypothyroidism   . Erosive gastritis 08/2004  . Hyperplastic polyps of stomach 06/2002  . Barrett's esophagus 04/2008  . GERD (gastroesophageal reflux disease)   . Diverticulitis   . Anxiety disorder   . Sleep apnea   . HTN (hypertension)   . Hx of colonoscopy     Past Surgical History  Procedure Date  . Defibrillator insertion   . Arm surgery   . Abdominal hysterectomy   . Breast lumpectomy 1988  . Appendectomy   . Tonsillectomy   . Right knee surgery     History  Smoking status  . Former Smoker  Smokeless tobacco  . Not on file    History  Alcohol Use No    Family History  Problem Relation Age of Onset  . Heart disease Other     maternal side  . Colon  cancer Paternal Uncle   . Breast cancer Maternal Aunt     Review of Systems: The review of systems is positive for a multitude of complaints. She is more depressed. She is having a lot of stomach problems. She feels tired a lot in the afternoons. She had recent cataract surgery and is not happy with the results. This summer she became sick with an upper respiratory type infection that was treated with antibiotics but she doesn't feel that she has recovered from this. All other systems were reviewed and are negative.  Physical Exam: BP 126/70  Pulse 54  Ht 5\' 8"  (1.727 m)  Wt 145 lb 6.4 oz (65.953 kg)  BMI 22.11 kg/m2 She is a thin white female in no acute distress. She is normocephalic, atraumatic. Her sclera are clear. Oropharynx is clear. Neck is without JVD, adenopathy, thyromegaly, or bruits. Lungs are clear. Cardiac exam reveals a regular rate and rhythm without gallop, murmur, or click. Abdomen is soft and nontender. She has no masses or bruits. Pedal pulses are 2+. She has no edema. Skin is warm and dry. She is alert and oriented x3 with a somewhat depressed affect.  Cranial nerves II through XII are intact. LABORATORY DATA: Reviewed from March of this year included a CBC, complete chemistry panel, and TSH which were all normal. Total cholesterol 165, triglycerides 63, HDL 52, and LDL 100. She also had [facility] screening which showed mild carotid disease. She had normal ankle-brachial indices. There is no evidence of abdominal aneurysm. Her body mass index was normal. Recent echocardiogram showed an ejection fraction of 50-55%.  Assessment / Plan:

## 2010-11-06 NOTE — Assessment & Plan Note (Signed)
She has a history of dilated cardiomyopathy with initial ejection fraction of 20%. With medical therapy and placement of a biventricular pacemaker her ejection fraction has now almost normalized. She is asymptomatic. I have recommended stopping her Lasix and potassium at this time but continue with her beta blocker and ARB therapy. She is also on digoxin. It is possible that with her next device change out she'll be switched to just a biventricular device since she has never had a defibrillator discharge and her ejection fraction is now almost normal.

## 2010-11-10 ENCOUNTER — Encounter: Payer: Self-pay | Admitting: Cardiology

## 2010-11-13 ENCOUNTER — Ambulatory Visit (INDEPENDENT_AMBULATORY_CARE_PROVIDER_SITE_OTHER): Payer: Medicare Other | Admitting: Internal Medicine

## 2010-11-13 ENCOUNTER — Encounter: Payer: Self-pay | Admitting: Internal Medicine

## 2010-11-13 DIAGNOSIS — I5022 Chronic systolic (congestive) heart failure: Secondary | ICD-10-CM

## 2010-11-13 DIAGNOSIS — Z95810 Presence of automatic (implantable) cardiac defibrillator: Secondary | ICD-10-CM

## 2010-11-13 DIAGNOSIS — I428 Other cardiomyopathies: Secondary | ICD-10-CM

## 2010-11-13 LAB — ICD DEVICE OBSERVATION
AL IMPEDENCE ICD: 640 Ohm
AL THRESHOLD: 0.8 V
ATRIAL PACING ICD: 75 pct
BATTERY VOLTAGE: 2.57 V
DEVICE MODEL ICD: 208554
LV LEAD IMPEDENCE ICD: 988 Ohm
LV LEAD THRESHOLD: 1 V
LV LEAD THRESHOLD: 1 V
RV LEAD IMPEDENCE ICD: 537 Ohm
RV LEAD THRESHOLD: 1.4 V
RV LEAD THRESHOLD: 1.6 V
RV LEAD THRESHOLD: 1.8 V
TOT-0002: 0
TOT-0006: 20120530000000
TZAT-0001FASTVT: 1
TZAT-0001FASTVT: 2
TZAT-0001SLOWVT: 2
TZAT-0002FASTVT: NEGATIVE
TZAT-0004SLOWVT: 8
TZAT-0005SLOWVT: 81 pct
TZAT-0005SLOWVT: 81 pct
TZAT-0012SLOWVT: 200 ms
TZAT-0013SLOWVT: 2
TZAT-0018FASTVT: NEGATIVE
TZAT-0018SLOWVT: NEGATIVE
TZAT-0018SLOWVT: NEGATIVE
TZAT-0019FASTVT: 7.5 V
TZAT-0019SLOWVT: 7.5 V
TZAT-0020FASTVT: 1 ms
TZON-0003FASTVT: 300 ms
TZON-0003SLOWVT: 353 ms
TZON-0004FASTVT: 2.5
TZON-0004SLOWVT: 2.5
TZON-0005FASTVT: 1
TZON-0005SLOWVT: 1
TZON-0010SLOWVT: 10 ms
TZST-0001FASTVT: 4
TZST-0001FASTVT: 6
TZST-0001SLOWVT: 4
TZST-0001SLOWVT: 7
TZST-0003FASTVT: 31 J
TZST-0003FASTVT: 31 J
TZST-0003FASTVT: 31 J
TZST-0003SLOWVT: 31 J
TZST-0003SLOWVT: 31 J
VENTRICULAR PACING ICD: 99 pct
VF: 0

## 2010-11-13 NOTE — Assessment & Plan Note (Signed)
Her left ventricular function has normalized and her symptoms are class II. She will continue her medical therapy at this time maintain a low-sodium diet.

## 2010-11-13 NOTE — Patient Instructions (Signed)
Your physician wants you to follow-up in: 6 months with Dr Taylor You will receive a reminder letter in the mail two months in advance. If you don't receive a letter, please call our office to schedule the follow-up appointment.  

## 2010-11-13 NOTE — Progress Notes (Signed)
HPI [name] returns today for followup. She is a pleasant 77 yo woman with a DCM, Severe LV dysfunction who had marked improvement in her LV function after BiV Pacing. She has recently had trouble after cataract surgery. She denies chest pain or shortness of breath or peripheral edema. Allergies  Allergen Reactions  . Amoxicillin   . Cephalexin   . Doxycycline   . Keflex   . Ofloxacin   . Penicillins      Current Outpatient Prescriptions  Medication Sig Dispense Refill  . Ascorbic Acid (VITAMIN C) 1000 MG tablet Take 2,000 mg by mouth daily.        Marland Kitchen aspirin 81 MG tablet Take 81 mg by mouth daily.        . Biotin 1000 MCG tablet Take 1,000 mcg by mouth daily.        . Calcium Citrate-Vitamin D (CITRACAL/VITAMIN D) 250-200 MG-UNIT TABS Take 2 tablets by mouth daily.        . digoxin (LANOXIN) 0.125 MG tablet TAKE 1 TABLET BY MOUTH EVERY DAY  60 tablet  5  . estrogen-methylTESTOSTERone (ESTRATEST HS) 0.625-1.25 MG per tablet Take 1 tablet by mouth daily.        . furosemide (LASIX) 20 MG tablet Take 20 mg by mouth daily. As needed      . levothyroxine (SYNTHROID, LEVOTHROID) 112 MCG tablet Take 112 mcg by mouth daily.        . metoprolol succinate (TOPROL-XL) 25 MG 24 hr tablet Take 25 mg by mouth daily. 1/2 po daily       . metoprolol tartrate (LOPRESSOR) 25 MG tablet TAKE 1/2 TABLET BY MOUTH EVERY DAY  15 tablet  5  . Multiple Vitamins-Minerals (CENTRUM SILVER PO) Take 1 tablet by mouth daily.        . potassium chloride SA (KLOR-CON M20) 20 MEQ tablet        . RABEprazole (ACIPHEX) 20 MG tablet Take 20 mg by mouth daily. 1/2 hour before breakfast       . trandolapril (MAVIK) 2 MG tablet TAKE 1 TABLET BY MOUTH TWICE A DAY  60 tablet  5  . DISCONTD: KLOR-CON M20 20 MEQ tablet TAKE 1 TABLET BY MOUTH EVERY DAY  60 tablet  5     Past Medical History  Diagnosis Date  . Nonischemic cardiomyopathy   . CHF (congestive heart failure)   . LBBB (left bundle branch block)   . Mitral  regurgitation   . Melanoma   . Hypothyroidism   . Erosive gastritis 08/2004  . Hyperplastic polyps of stomach 06/2002  . Barrett's esophagus 04/2008  . GERD (gastroesophageal reflux disease)   . Diverticulitis   . Anxiety disorder   . Sleep apnea   . HTN (hypertension)   . Hx of colonoscopy     ROS:   All systems reviewed and negative except as noted in the HPI.   Past Surgical History  Procedure Date  . Defibrillator insertion   . Arm surgery   . Abdominal hysterectomy   . Breast lumpectomy 1988  . Appendectomy   . Tonsillectomy   . Right knee surgery      Family History  Problem Relation Age of Onset  . Heart disease Other     maternal side  . Colon cancer Paternal Uncle   . Breast cancer Maternal Aunt      History   Social History  . Marital Status: Single    Spouse Name: N/A  Number of Children: 0  . Years of Education: N/A   Occupational History  . Retired   .     Social History Main Topics  . Smoking status: Former Games developer  . Smokeless tobacco: Not on file  . Alcohol Use: No  . Drug Use: No  . Sexually Active: Not on file   Other Topics Concern  . Not on file   Social History Narrative  . No narrative on file     BP 107/63  Pulse 55  Ht 5\' 9"  (1.753 m)  Wt 144 lb (65.318 kg)  BMI 21.27 kg/m2  Physical Exam:  Well appearing NAD HEENT: Unremarkable Neck:  No JVD, no thyromegally Lymphatics:  No adenopathy Back:  No CVA tenderness Lungs:  Clear. Well-healed ICD incision. HEART:  Regular rate rhythm, no murmurs, no rubs, no clicks Abd:  soft, positive bowel sounds, no organomegally, no rebound, no guarding Ext:  2 plus pulses, no edema, no cyanosis, no clubbing Skin:  No rashes no nodules Neuro:  CN II through XII intact, motor grossly intact  DEVICE  Normal device function.  See PaceArt for details. She is approaching ERI but not there yet.  Assess/Plan:

## 2010-11-13 NOTE — Assessment & Plan Note (Signed)
Her device is working normally. My anticipation would be that when she reaches elective replacement, a biventricular pacemaker would be recommended.

## 2010-11-27 ENCOUNTER — Telehealth: Payer: Self-pay | Admitting: Cardiology

## 2010-11-27 NOTE — Telephone Encounter (Signed)
Pt calling regarding ECHO that Dr. Sw[place]iland requested. Pt said she got her EOB and it stated Dr. Myrtis Ser name under the procedure. Pt wanting to know why. Please return call to discuss further.

## 2010-11-27 NOTE — Telephone Encounter (Signed)
Called wanting to know why Dr. Henrietta Hoover name was on the billing for her Echo. Advised he is one of the physician's with [facility]. We are part of the [facility]. He was the one who read the study. Left her message explaining this.

## 2010-12-21 ENCOUNTER — Other Ambulatory Visit: Payer: Self-pay | Admitting: Cardiology

## 2011-01-07 ENCOUNTER — Other Ambulatory Visit: Payer: Self-pay | Admitting: Gastroenterology

## 2011-01-07 NOTE — Telephone Encounter (Signed)
NEEDS OFFICE VISIT FOR FURTHER REFILLS  

## 2011-01-20 ENCOUNTER — Telehealth: Payer: Self-pay | Admitting: Internal Medicine

## 2011-02-11 ENCOUNTER — Ambulatory Visit (INDEPENDENT_AMBULATORY_CARE_PROVIDER_SITE_OTHER): Payer: Medicare Other | Admitting: *Deleted

## 2011-02-11 ENCOUNTER — Encounter: Payer: Self-pay | Admitting: Internal Medicine

## 2011-02-11 DIAGNOSIS — I5022 Chronic systolic (congestive) heart failure: Secondary | ICD-10-CM

## 2011-02-11 DIAGNOSIS — I428 Other cardiomyopathies: Secondary | ICD-10-CM

## 2011-02-11 LAB — ICD DEVICE OBSERVATION
AL THRESHOLD: 0.6 V
ATRIAL PACING ICD: 74 pct
BAMS-0002: 8 ms
CHARGE TIME: 9 s
DEV-0020ICD: NEGATIVE
LV LEAD IMPEDENCE ICD: 1028 Ohm
RV LEAD IMPEDENCE ICD: 506 Ohm
RV LEAD THRESHOLD: 1.2 V
TZAT-0001FASTVT: 1
TZAT-0001FASTVT: 2
TZAT-0001SLOWVT: 1
TZAT-0002FASTVT: NEGATIVE
TZAT-0012FASTVT: 200 ms
TZAT-0012SLOWVT: 200 ms
TZAT-0013SLOWVT: 2
TZAT-0018FASTVT: NEGATIVE
TZAT-0018SLOWVT: NEGATIVE
TZAT-0019SLOWVT: 7.5 V
TZAT-0019SLOWVT: 7.5 V
TZAT-0020FASTVT: 1 ms
TZAT-0020SLOWVT: 1 ms
TZAT-0020SLOWVT: 1 ms
TZON-0004FASTVT: 2.5
TZON-0004SLOWVT: 2.5
TZON-0005SLOWVT: 1
TZST-0001FASTVT: 5
TZST-0001FASTVT: 7
TZST-0001SLOWVT: 3
TZST-0001SLOWVT: 5
TZST-0001SLOWVT: 7
TZST-0003FASTVT: 31 J
TZST-0003FASTVT: 31 J
TZST-0003FASTVT: 31 J
TZST-0003SLOWVT: 31 J
TZST-0003SLOWVT: 31 J
TZST-0003SLOWVT: 31 J

## 2011-02-11 NOTE — Progress Notes (Signed)
ICD check 

## 2011-03-11 DIAGNOSIS — I10 Essential (primary) hypertension: Secondary | ICD-10-CM | POA: Diagnosis not present

## 2011-03-11 DIAGNOSIS — E785 Hyperlipidemia, unspecified: Secondary | ICD-10-CM | POA: Diagnosis not present

## 2011-03-15 DIAGNOSIS — Z1212 Encounter for screening for malignant neoplasm of rectum: Secondary | ICD-10-CM | POA: Diagnosis not present

## 2011-03-15 DIAGNOSIS — Z130 Encounter for screening for diseases of the blood and blood-forming organs and certain disorders involving the immune mechanism: Secondary | ICD-10-CM | POA: Diagnosis not present

## 2011-03-15 DIAGNOSIS — Z124 Encounter for screening for malignant neoplasm of cervix: Secondary | ICD-10-CM | POA: Diagnosis not present

## 2011-03-15 DIAGNOSIS — B373 Candidiasis of vulva and vagina: Secondary | ICD-10-CM | POA: Diagnosis not present

## 2011-03-16 DIAGNOSIS — I10 Essential (primary) hypertension: Secondary | ICD-10-CM | POA: Diagnosis not present

## 2011-03-16 DIAGNOSIS — I428 Other cardiomyopathies: Secondary | ICD-10-CM | POA: Diagnosis not present

## 2011-03-16 DIAGNOSIS — J339 Nasal polyp, unspecified: Secondary | ICD-10-CM | POA: Diagnosis not present

## 2011-03-16 DIAGNOSIS — F329 Major depressive disorder, single episode, unspecified: Secondary | ICD-10-CM | POA: Diagnosis not present

## 2011-03-18 DIAGNOSIS — J330 Polyp of nasal cavity: Secondary | ICD-10-CM | POA: Diagnosis not present

## 2011-03-18 DIAGNOSIS — R51 Headache: Secondary | ICD-10-CM | POA: Diagnosis not present

## 2011-03-23 ENCOUNTER — Other Ambulatory Visit: Payer: Self-pay | Admitting: Otolaryngology

## 2011-03-23 ENCOUNTER — Ambulatory Visit
Admission: RE | Admit: 2011-03-23 | Discharge: 2011-03-23 | Disposition: A | Discharge status: Home / Self Care | Payer: Medicare Other | Source: Ambulatory Visit | Attending: Otolaryngology | Admitting: Otolaryngology

## 2011-03-23 DIAGNOSIS — J339 Nasal polyp, unspecified: Secondary | ICD-10-CM | POA: Diagnosis not present

## 2011-03-23 DIAGNOSIS — J3489 Other specified disorders of nose and nasal sinuses: Secondary | ICD-10-CM | POA: Diagnosis not present

## 2011-04-05 ENCOUNTER — Telehealth: Payer: Self-pay | Admitting: Hematology & Oncology

## 2011-04-05 NOTE — Telephone Encounter (Signed)
Pt cx 1-30 said NP told her she didn't need to come back if she didn't want to. Pt is aware appointment is with MD. She is going to have PCP keep up with her and will call us if we are needed.

## 2011-04-07 ENCOUNTER — Ambulatory Visit: Payer: Medicare Other | Admitting: Family

## 2011-04-07 ENCOUNTER — Other Ambulatory Visit: Payer: Self-pay | Admitting: *Deleted

## 2011-04-07 ENCOUNTER — Ambulatory Visit: Payer: Medicare Other | Admitting: Hematology & Oncology

## 2011-04-07 MED ORDER — METOPROLOL TARTRATE 25 MG PO TABS: 12.5000 mg | ORAL_TABLET | Freq: Every day | ORAL | Status: DC

## 2011-04-08 ENCOUNTER — Other Ambulatory Visit: Payer: Self-pay | Admitting: *Deleted

## 2011-04-08 DIAGNOSIS — J310 Chronic rhinitis: Secondary | ICD-10-CM | POA: Diagnosis not present

## 2011-04-09 ENCOUNTER — Telehealth: Payer: Self-pay | Admitting: Cardiology

## 2011-04-09 MED ORDER — METOPROLOL TARTRATE 25 MG PO TABS: 12.5000 mg | ORAL_TABLET | Freq: Every day | ORAL | Status: DC

## 2011-04-09 NOTE — Telephone Encounter (Signed)
auth needed for refill of toprol, uses [facility], pharmacy states they faxed [place] twice, I dont see any docmentation

## 2011-04-16 ENCOUNTER — Telehealth: Payer: Self-pay | Admitting: Gastroenterology

## 2011-04-16 NOTE — Telephone Encounter (Signed)
Spoke with patient and she states the Aciphex is too expensive and she ran out yesterday but she has been taking OTC Zegerid once daily. She wants to know if she can continue to take Zegerid OTC. I told her that was fine to take and if she needs a prescription we can send one but she needs to schedule a office visit for any more refills. Pt scheduled an appointment for 05/05/11. She states she will take OTC Zegerid until then.

## 2011-04-23 ENCOUNTER — Encounter: Payer: Self-pay | Admitting: Cardiology

## 2011-04-23 ENCOUNTER — Ambulatory Visit (INDEPENDENT_AMBULATORY_CARE_PROVIDER_SITE_OTHER): Payer: Medicare Other | Admitting: Cardiology

## 2011-04-23 VITALS — BP 128/68 | HR 55 | Ht 68.0 in | Wt 148.0 lb

## 2011-04-23 DIAGNOSIS — I428 Other cardiomyopathies: Secondary | ICD-10-CM | POA: Diagnosis not present

## 2011-04-23 DIAGNOSIS — I447 Left bundle-branch block, unspecified: Secondary | ICD-10-CM | POA: Diagnosis not present

## 2011-04-23 DIAGNOSIS — I5022 Chronic systolic (congestive) heart failure: Secondary | ICD-10-CM | POA: Diagnosis not present

## 2011-04-23 DIAGNOSIS — I420 Dilated cardiomyopathy: Secondary | ICD-10-CM

## 2011-04-23 DIAGNOSIS — I509 Heart failure, unspecified: Secondary | ICD-10-CM | POA: Diagnosis not present

## 2011-04-23 NOTE — Assessment & Plan Note (Signed)
Last ejection fraction of 50%. She is asymptomatic. She has had a dramatic response to biventricular pacing. We will continue with her current therapy with ARB and beta blocker therapy. Continue digoxin. Use Lasix as needed for swelling.

## 2011-04-23 NOTE — Progress Notes (Signed)
[name] Date of Birth: 1932/05/13   History of Present Illness: [name] is seen today for followup of her cardiomyopathy. She has been doing very well from a cardiac standpoint. She denies any symptoms of dyspnea, edema, orthopnea, or chest pain. She has had no palpitations. Since her last visit she has been taking her Lasix only on a when necessary basis and in sup taking it once or twice a week. Her major concern today is that she has varicose veins. These are nontender and are not inflamed.  Current Outpatient Prescriptions on File Prior to Visit  Medication Sig Dispense Refill  . Ascorbic Acid (VITAMIN C) 1000 MG tablet Take 500 mg by mouth daily.       Marland Kitchen aspirin 81 MG tablet Take 81 mg by mouth daily.        . Biotin 1000 MCG tablet Take 1,000 mcg by mouth daily.        . Calcium Citrate-Vitamin D (CITRACAL/VITAMIN D) 250-200 MG-UNIT TABS Take 2 tablets by mouth daily.        . digoxin (LANOXIN) 0.125 MG tablet TAKE 1 TABLET BY MOUTH EVERY DAY  60 tablet  5  . estrogen-methylTESTOSTERone (ESTRATEST HS) 0.625-1.25 MG per tablet Take 1 tablet by mouth daily.        . furosemide (LASIX) 20 MG tablet Take 20 mg by mouth daily. As needed      . levothyroxine (SYNTHROID, LEVOTHROID) 112 MCG tablet Take 112 mcg by mouth daily.        . metoprolol tartrate (LOPRESSOR) 25 MG tablet Take 0.5 tablets (12.5 mg total) by mouth daily.  30 tablet  12  . Multiple Vitamins-Minerals (CENTRUM SILVER PO) Take 1 tablet by mouth daily.        . potassium chloride SA (KLOR-CON M20) 20 MEQ tablet        . trandolapril (MAVIK) 2 MG tablet TAKE 1 TABLET BY MOUTH TWICE A DAY  60 tablet  5    Allergies  Allergen Reactions  . Cephalexin   . Doxycycline   . Keflex   . Ofloxacin   . Penicillins     Past Medical History  Diagnosis Date  . Nonischemic cardiomyopathy   . CHF (congestive heart failure)   . LBBB (left bundle branch block)   . Mitral regurgitation   . Melanoma   . Hypothyroidism   .  Erosive gastritis 08/2004  . Hyperplastic polyps of stomach 06/2002  . Barrett's esophagus 04/2008  . GERD (gastroesophageal reflux disease)   . Diverticulitis   . Anxiety disorder   . Sleep apnea   . HTN (hypertension)   . Hx of colonoscopy     Past Surgical History  Procedure Date  . Defibrillator insertion   . Arm surgery   . Abdominal hysterectomy   . Breast lumpectomy 1988  . Appendectomy   . Tonsillectomy   . Right knee surgery     History  Smoking status  . Former Smoker  Smokeless tobacco  . Not on file    History  Alcohol Use No    Family History  Problem Relation Age of Onset  . Heart disease Other     maternal side  . Colon cancer Paternal Uncle   . Breast cancer Maternal Aunt     Review of Systems: As noted in history of present illness. All other systems were reviewed and are negative.  Physical Exam: BP 128/68  Pulse 55  Ht 5\' 8"  (1.727 m)  Wt 148 lb (67.132 kg)  BMI 22.50 kg/m2 She is a thin white female in no acute distress. She is normocephalic, atraumatic. Her sclera are clear. Oropharynx is clear. Neck is without JVD, adenopathy, thyromegaly, or bruits. Lungs are clear. Cardiac exam reveals a regular rate and rhythm without gallop, murmur, or click. Abdomen is soft and nontender. She has no masses or bruits. Pedal pulses are 2+. She has no edema. Skin is warm and dry. She does have superficial varicosities in her lower extremities. She is alert and oriented x3 with a somewhat depressed affect. Cranial nerves II through XII are intact. LABORATORY DATA: Reviewed from 03/11/2011 complete chemistry panel was normal. Total cholesterol 158, triglycerides 50, LDL 97, HDL 51.  Assessment / Plan:

## 2011-04-23 NOTE — Patient Instructions (Signed)
Continue your current medication.  I will see you again in 6 months.   

## 2011-04-29 ENCOUNTER — Encounter: Payer: Self-pay | Admitting: Gastroenterology

## 2011-04-29 DIAGNOSIS — J330 Polyp of nasal cavity: Secondary | ICD-10-CM | POA: Diagnosis not present

## 2011-04-29 DIAGNOSIS — J322 Chronic ethmoidal sinusitis: Secondary | ICD-10-CM | POA: Diagnosis not present

## 2011-05-05 ENCOUNTER — Encounter: Payer: Self-pay | Admitting: Gastroenterology

## 2011-05-05 ENCOUNTER — Ambulatory Visit (INDEPENDENT_AMBULATORY_CARE_PROVIDER_SITE_OTHER): Payer: Medicare Other | Admitting: Gastroenterology

## 2011-05-05 VITALS — BP 110/70 | HR 68 | Ht 68.0 in | Wt 146.8 lb

## 2011-05-05 DIAGNOSIS — K2270 Barrett's esophagus without dysplasia: Secondary | ICD-10-CM | POA: Diagnosis not present

## 2011-05-05 DIAGNOSIS — Z1211 Encounter for screening for malignant neoplasm of colon: Secondary | ICD-10-CM

## 2011-05-05 MED ORDER — OMEPRAZOLE 20 MG PO CPDR: 20.0000 mg | DELAYED_RELEASE_CAPSULE | Freq: Every day | ORAL | Status: DC

## 2011-05-05 NOTE — Patient Instructions (Signed)
You have been scheduled for an endoscopy . Please follow written instructions given to you at your visit today. We have sent the following medications to your pharmacy for you to pick up at your convenience: Omeprazole  cc: Murray Hodgkins MD

## 2011-05-05 NOTE — Progress Notes (Signed)
History of Present Illness: This is a 78 year old female with Barrett's esophagus who returns for followup. She has no ongoing gastrointestinal complaints. She is due for surveillance endoscopy. She has several questions about the pros and cons of different PPIs. She states she had testing for occult blood in her stool through Dr. Donnetta Hail office that was negative. Denies weight loss, abdominal pain, constipation, diarrhea, change in stool caliber, melena, hematochezia, nausea, vomiting, dysphagia, reflux symptoms, chest pain.  Current Medications, Allergies, Past Medical History, Past Surgical History, Family History and Social History were reviewed in [employer] record.  Physical Exam: General: Well developed , well nourished, no acute distress Head: Normocephalic and atraumatic Eyes:  sclerae anicteric, EOMI Ears: Normal auditory acuity Mouth: No deformity or lesions Lungs: Clear throughout to auscultation Heart: Regular rate and rhythm; no murmurs, rubs or bruits Abdomen: Soft, non tender and non distended. No masses, hepatosplenomegaly or hernias noted. Normal Bowel sounds Musculoskeletal: Symmetrical with no gross deformities  Pulses:  Normal pulses noted Extremities: No clubbing, cyanosis, edema or deformities noted Neurological: Alert oriented x 4, grossly nonfocal Psychological:  Alert and cooperative. Anxious.  Assessment and Recommendations:  1. Barrett's esophagus. Continue a daily PPI and standard antireflux measures. I answered her questions regarding the various PPIs. I am comfortable with her taking any PPI that controls her symptoms and is cost-effective for her. Change to omeprazole 20 mg daily. The risks, benefits, and alternatives to endoscopy with possible biopsy and possible dilation were discussed with the patient and they consent to proceed.   2. Colorectal cancer screening. Patient has a cousin at age 82 of an uncle age 54 both with colon cancer.  She was recommended to have a 10 years surveillance colonoscopy in April 2014. She states she may want to move her colonoscopy to an earlier date and I think that is reasonable given her family history. She will call us to notify us if she wants to proceed sooner than her 10 year interval.  3. AICD.

## 2011-05-18 DIAGNOSIS — J322 Chronic ethmoidal sinusitis: Secondary | ICD-10-CM | POA: Diagnosis not present

## 2011-05-20 ENCOUNTER — Encounter: Payer: Self-pay | Admitting: Internal Medicine

## 2011-05-20 ENCOUNTER — Ambulatory Visit (INDEPENDENT_AMBULATORY_CARE_PROVIDER_SITE_OTHER): Payer: Medicare Other | Admitting: *Deleted

## 2011-05-20 DIAGNOSIS — Z95810 Presence of automatic (implantable) cardiac defibrillator: Secondary | ICD-10-CM

## 2011-05-20 DIAGNOSIS — I495 Sick sinus syndrome: Secondary | ICD-10-CM

## 2011-05-20 LAB — ICD DEVICE OBSERVATION
AL THRESHOLD: 0.6 V
BAMS-0001: 170 {beats}/min
CHARGE TIME: 9.2 s
DEV-0020ICD: NEGATIVE
LV LEAD AMPLITUDE: 6.5 mv
LV LEAD IMPEDENCE ICD: 951 Ohm
LV LEAD THRESHOLD: 1 V
RV LEAD IMPEDENCE ICD: 530 Ohm
TZAT-0001FASTVT: 1
TZAT-0001SLOWVT: 1
TZAT-0001SLOWVT: 2
TZAT-0002FASTVT: NEGATIVE
TZAT-0004FASTVT: 8
TZAT-0004SLOWVT: 8
TZAT-0005FASTVT: 81 pct
TZAT-0005SLOWVT: 81 pct
TZAT-0018SLOWVT: NEGATIVE
TZAT-0019SLOWVT: 7.5 V
TZAT-0019SLOWVT: 7.5 V
TZAT-0020SLOWVT: 1 ms
TZAT-0020SLOWVT: 1 ms
TZON-0004SLOWVT: 2.5
TZON-0008SLOWVT: 3:0 {titer}
TZON-0010SLOWVT: 10 ms
TZST-0001FASTVT: 3
TZST-0001FASTVT: 4
TZST-0001FASTVT: 7
TZST-0001SLOWVT: 3
TZST-0001SLOWVT: 6
TZST-0003FASTVT: 31 J
TZST-0003SLOWVT: 23 J
TZST-0003SLOWVT: 31 J
TZST-0003SLOWVT: 31 J
TZST-0003SLOWVT: 31 J

## 2011-05-20 NOTE — Progress Notes (Signed)
defib check in clinic  

## 2011-05-25 ENCOUNTER — Telehealth: Payer: Self-pay | Admitting: Internal Medicine

## 2011-05-25 ENCOUNTER — Ambulatory Visit (INDEPENDENT_AMBULATORY_CARE_PROVIDER_SITE_OTHER): Payer: Medicare Other | Admitting: *Deleted

## 2011-05-25 DIAGNOSIS — I428 Other cardiomyopathies: Secondary | ICD-10-CM | POA: Diagnosis not present

## 2011-05-25 DIAGNOSIS — I5022 Chronic systolic (congestive) heart failure: Secondary | ICD-10-CM | POA: Diagnosis not present

## 2011-05-25 NOTE — Progress Notes (Signed)
PPM check 

## 2011-05-25 NOTE — Telephone Encounter (Signed)
New Problem:    At check out patient was feel uncomfortable and requested to speak with you on 05/26/11 about some questions she has about her PVC's. Please call back.

## 2011-05-25 NOTE — Telephone Encounter (Signed)
This is a pt of Dr Elvis Coil that Dr Ladona Ridgel follow for device

## 2011-05-25 NOTE — Telephone Encounter (Signed)
Patient called no answer.LMTC. 

## 2011-05-26 ENCOUNTER — Telehealth: Payer: Self-pay | Admitting: Gastroenterology

## 2011-05-26 MED ORDER — METOPROLOL TARTRATE 25 MG PO TABS: ORAL_TABLET | ORAL | Status: DC

## 2011-05-26 NOTE — Telephone Encounter (Signed)
Patient called, stating she had ICD checked last week and noticed after it was checked her rate did not come down like it usually does.States she came back yesterday 05/25/11 and had ICD checked again and was told everything was good except she was having PVC's.Patient states she is scheduled for a colon and endo soon and wanted to make sure ok to have done.Spoke with Norma Fredrickson NP she advised to increase lopressor to 12.5 mg twice daily,and make sure you let GI knows you have a ICD.States GI knows about her her heart condition.Advised to call back if the increase of lopressor does not work.

## 2011-05-26 NOTE — Telephone Encounter (Signed)
Per office note in Feb ok to move up colon and do with endo if the patient chooses.  She is rescheduled for both procedures on 07/16/11 11:30, pre-visit 07/07/11 2:30

## 2011-05-27 ENCOUNTER — Encounter: Payer: Self-pay | Admitting: Internal Medicine

## 2011-05-27 LAB — ICD DEVICE OBSERVATION
BAMS-0002: 8 ms
DEV-0020ICD: NEGATIVE
DEVICE MODEL ICD: 208554
TZAT-0001FASTVT: 2
TZAT-0001SLOWVT: 1
TZAT-0012FASTVT: 200 ms
TZAT-0012SLOWVT: 200 ms
TZAT-0013SLOWVT: 2
TZAT-0018FASTVT: NEGATIVE
TZAT-0019SLOWVT: 7.5 V
TZAT-0019SLOWVT: 7.5 V
TZAT-0020FASTVT: 1 ms
TZAT-0020SLOWVT: 1 ms
TZAT-0020SLOWVT: 1 ms
TZON-0004FASTVT: 2.5
TZON-0005SLOWVT: 1
TZON-0008SLOWVT: 3:0 {titer}
TZST-0001FASTVT: 5
TZST-0001FASTVT: 7
TZST-0001SLOWVT: 3
TZST-0001SLOWVT: 5
TZST-0001SLOWVT: 7
TZST-0003FASTVT: 31 J
TZST-0003FASTVT: 31 J
TZST-0003FASTVT: 31 J
TZST-0003SLOWVT: 31 J
TZST-0003SLOWVT: 31 J

## 2011-06-02 ENCOUNTER — Other Ambulatory Visit: Payer: Medicare Other | Admitting: Gastroenterology

## 2011-06-08 ENCOUNTER — Encounter: Payer: Self-pay | Admitting: Nurse Practitioner

## 2011-06-08 ENCOUNTER — Ambulatory Visit (INDEPENDENT_AMBULATORY_CARE_PROVIDER_SITE_OTHER): Payer: Medicare Other | Admitting: Nurse Practitioner

## 2011-06-08 ENCOUNTER — Ambulatory Visit
Admission: RE | Admit: 2011-06-08 | Discharge: 2011-06-08 | Disposition: A | Discharge status: Home / Self Care | Payer: Medicare Other | Source: Ambulatory Visit | Attending: Nurse Practitioner | Admitting: Nurse Practitioner

## 2011-06-08 VITALS — BP 108/64 | HR 55 | Ht 69.0 in | Wt 146.0 lb

## 2011-06-08 DIAGNOSIS — Z95810 Presence of automatic (implantable) cardiac defibrillator: Secondary | ICD-10-CM

## 2011-06-08 DIAGNOSIS — R062 Wheezing: Secondary | ICD-10-CM

## 2011-06-08 DIAGNOSIS — I428 Other cardiomyopathies: Secondary | ICD-10-CM

## 2011-06-08 DIAGNOSIS — Z79899 Other long term (current) drug therapy: Secondary | ICD-10-CM | POA: Diagnosis not present

## 2011-06-08 DIAGNOSIS — R002 Palpitations: Secondary | ICD-10-CM | POA: Diagnosis not present

## 2011-06-08 DIAGNOSIS — R0989 Other specified symptoms and signs involving the circulatory and respiratory systems: Secondary | ICD-10-CM | POA: Diagnosis not present

## 2011-06-08 DIAGNOSIS — I493 Ventricular premature depolarization: Secondary | ICD-10-CM | POA: Insufficient documentation

## 2011-06-08 DIAGNOSIS — R0602 Shortness of breath: Secondary | ICD-10-CM | POA: Diagnosis not present

## 2011-06-08 DIAGNOSIS — R0609 Other forms of dyspnea: Secondary | ICD-10-CM

## 2011-06-08 DIAGNOSIS — I5022 Chronic systolic (congestive) heart failure: Secondary | ICD-10-CM

## 2011-06-08 DIAGNOSIS — R0600 Dyspnea, unspecified: Secondary | ICD-10-CM

## 2011-06-08 LAB — CBC WITH DIFFERENTIAL/PLATELET
Basophils Absolute: 0 10*3/uL (ref 0.0–0.1)
Basophils Relative: 0.2 % (ref 0.0–3.0)
Eosinophils Absolute: 0.3 10*3/uL (ref 0.0–0.7)
Eosinophils Relative: 3.9 % (ref 0.0–5.0)
HCT: 42.6 % (ref 36.0–46.0)
Hemoglobin: 14 g/dL (ref 12.0–15.0)
Lymphocytes Relative: 23.8 % (ref 12.0–46.0)
Lymphs Abs: 1.6 10*3/uL (ref 0.7–4.0)
MCHC: 32.9 g/dL (ref 30.0–36.0)
MCV: 93.4 fl (ref 78.0–100.0)
Monocytes Absolute: 0.7 10*3/uL (ref 0.1–1.0)
Monocytes Relative: 10.3 % (ref 3.0–12.0)
Neutro Abs: 4.3 10*3/uL (ref 1.4–7.7)
Neutrophils Relative %: 61.8 % (ref 43.0–77.0)
Platelets: 214 10*3/uL (ref 150.0–400.0)
RBC: 4.56 Mil/uL (ref 3.87–5.11)
RDW: 12.9 % (ref 11.5–14.6)
WBC: 6.9 10*3/uL (ref 4.5–10.5)

## 2011-06-08 LAB — BASIC METABOLIC PANEL
BUN: 15 mg/dL (ref 6–23)
CO2: 28 mEq/L (ref 19–32)
Calcium: 8.8 mg/dL (ref 8.4–10.5)
Chloride: 99 mEq/L (ref 96–112)
Creatinine, Ser: 0.6 mg/dL (ref 0.4–1.2)
GFR: 105.15 mL/min (ref >60.00)
Glucose, Bld: 84 mg/dL (ref 70–99)
Potassium: 4 mEq/L (ref 3.5–5.1)
Sodium: 135 mEq/L (ref 135–145)

## 2011-06-08 LAB — MAGNESIUM: Magnesium: 2 mg/dL (ref 1.5–2.5)

## 2011-06-08 LAB — BRAIN NATRIURETIC PEPTIDE: Pro B Natriuretic peptide (BNP): 111 pg/mL — ABNORMAL HIGH (ref 0.0–100.0)

## 2011-06-08 NOTE — Assessment & Plan Note (Signed)
She looks compensated to me. Last echo showed improvement in her EF. I do not think we need to repeat her echo at this time.

## 2011-06-08 NOTE — Patient Instructions (Signed)
We are going to recheck some labs today.  I am going to send you for a CXR today at [facility] at the [employer].  For now, stay on your current medicines.  You may take the Augmentin as prescribed.  I will talk to Dr. Swaziland about maybe changing your medicines.  Call the [facility] office at [phone] if you have any questions, problems or concerns.

## 2011-06-08 NOTE — Assessment & Plan Note (Addendum)
[name] presents today with worsening palpitations. Does have PVC's on her device check. Does not like taking the metoprolol due to fatigue. She also has a concurrent sinusitis/bronchitis. Still wheezing on exam. We are going to check some labs today. She will get a CXR today. She is going to take the round of antibiotics that she was already prescribed. I do not think she needs further testing at this time. She is reassured. I told her I would have Dr. Swaziland review her record as well. May give consideration for changing her beta blocker to a different agent that she would tolerate better. But for now, no changes in her current regimen. We will tentatively see her back in August but sooner if she has any change or worsening of symptoms. Patient is agreeable to this plan and will call if any problems develop in the interim.

## 2011-06-08 NOTE — Assessment & Plan Note (Addendum)
Device is checked by [facility] today. No problems noted. She is receiving her BiV therapy 99%. She does have PVC's. Her device is apparently nearing end of life. She will probably only have CRT change out when the time comes.

## 2011-06-08 NOTE — Progress Notes (Signed)
[name] Date of Birth: [birth year] Medical Record #[id number]  History of Present Illness: [name] is seen today for a work in visit. She is seen for Dr. Swaziland. She has a history of a dilated CM. EF initially down to 20%. Now up to 50%. She had a dramatic response with Bi V therapy. Other issues include hypothyroidism, prior melanoma, gastritis, depression, anxiety, HLD and chronic LBBB.   She comes in today. She is here alone. She had called about 2 weeks ago stating that she had had her device checked earlier in the month.  She did not feel like her heart rate came down as it usually did. The device was rechecked again and was found to be functioning appropriately. She was having some PVC's. She has had her Lopressor increased to 12.5 mg BID for the PVC's. She now comes in because she continues to notice that her heart is beating too hard. She feels like she is getting surges of adrenaline. She is short of breath. She has had a prolonged bout of sinusitis/bronchitis that has still not resolved. She took a round of antibiotics several months ago and was told to repeat it, but she did not. She does not use caffeine. She does not use alcohol. She is anxious. She does not like taking metoprolol due to significant fatigue. It is not clear as to whether she has ever tried Coreg. She is not really dizzy or lightheaded. She says the hard heart beating makes her chest sore.  Current Outpatient Prescriptions on File Prior to Visit  Medication Sig Dispense Refill  . aspirin 81 MG tablet Take 81 mg by mouth daily.        . Biotin 1000 MCG tablet Take 1,000 mcg by mouth. Take 2 tablets every day      . Calcium Carbonate-Vitamin D (CALCIUM + D PO) Take by mouth. Takes 1200mg  of Calcium and 600mg  Vit D every day      . digoxin (LANOXIN) 0.125 MG tablet TAKE 1 TABLET BY MOUTH EVERY DAY  60 tablet  5  . estrogen-methylTESTOSTERone (ESTRATEST HS) 0.625-1.25 MG per tablet Take 1 tablet by mouth daily.         . furosemide (LASIX) 20 MG tablet Take 20 mg by mouth daily. As needed      . levothyroxine (SYNTHROID, LEVOTHROID) 112 MCG tablet Take 112 mcg by mouth daily.        . Multiple Vitamins-Minerals (CENTRUM SILVER PO) Take 1 tablet by mouth daily.        Maxwell Caul Bicarbonate (ZEGERID PO) Take by mouth daily.      . potassium chloride SA (KLOR-CON M20) 20 MEQ tablet as needed.       . trandolapril (MAVIK) 2 MG tablet TAKE 1 TABLET BY MOUTH TWICE A DAY  60 tablet  5  . vitamin C (ASCORBIC ACID) 500 MG tablet Take 500 mg by mouth daily.      Marland Kitchen DISCONTD: metoprolol tartrate (LOPRESSOR) 25 MG tablet Take 1/2 tablet twice daily.  60 tablet  11  . DISCONTD: omeprazole (PRILOSEC) 20 MG capsule Take 1 capsule (20 mg total) by mouth daily.  30 capsule  11    Allergies  Allergen Reactions  . Cephalexin   . Doxycycline   . Keflex   . Ofloxacin     Past Medical History  Diagnosis Date  . Nonischemic cardiomyopathy     EF initially 20%; Last measurement up to 50% per echo in August of  2012  . CHF (congestive heart failure)   . LBBB (left bundle branch block)   . Mitral regurgitation   . Melanoma   . Hypothyroidism   . Erosive gastritis 08/2004  . Hyperplastic polyps of stomach 06/2002  . Barrett's esophagus 04/2008  . GERD (gastroesophageal reflux disease)   . Diverticulitis   . Anxiety disorder   . Sleep apnea   . HTN (hypertension)   . ICD (implantable cardiac defibrillator) in place     BiV/ICD    Past Surgical History  Procedure Date  . Defibrillator insertion   . Arm surgery     left  . Abdominal hysterectomy   . Breast lumpectomy 1988    ? side per pt  . Appendectomy   . Tonsillectomy   . Right knee surgery     History  Smoking status  . Former Smoker  Smokeless tobacco  . Never Used    History  Alcohol Use No    Family History  Problem Relation Age of Onset  . Heart disease Other     maternal side  . Colon cancer Paternal Uncle   . Breast cancer  Maternal Aunt   . Diabetes Neg Hx     Review of Systems: The review of systems is positive for palpitations.  All other systems were reviewed and are negative.  Physical Exam: BP 108/64  Pulse 55  Ht 5\' 9"  (1.753 m)  Wt 146 lb (66.225 kg)  BMI 21.56 kg/m2 Patient is pleasant and in no acute distress. Seems a little anxious. Skin is warm and dry. Color is normal.  HEENT is unremarkable. Normocephalic/atraumatic. PERRL. Sclera are nonicteric. Neck is supple. No masses. No JVD. Lungs are clear but with some scattered wheezes throughout. Cardiac exam shows a regular rate and rhythm. Abdomen is soft. Extremities are without edema. Gait and ROM are intact. No gross neurologic deficits noted.  LABORATORY DATA: EKG shows AV pacing at a rate of 55. Her device was rechecked today. She is having PVC's but otherwise the device is functioning appropriately. She is getting her BiV therapy 99%.    Assessment / Plan:

## 2011-06-09 ENCOUNTER — Telehealth: Payer: Self-pay | Admitting: Cardiology

## 2011-06-09 LAB — DIGOXIN LEVEL: Digoxin Level: 0.7 ng/mL — ABNORMAL LOW (ref 0.8–2.0)

## 2011-06-09 NOTE — Telephone Encounter (Signed)
Patient returning Nurse Elnita Maxwell call, she can be reached at [phone].

## 2011-06-09 NOTE — Telephone Encounter (Signed)
Patient called,stating should she continue lopressor 12.5 mg twice daily.States continues to have pounding heart,might be a little better.States will call back if her heart continues to pound.

## 2011-06-11 ENCOUNTER — Encounter: Payer: Self-pay | Admitting: Cardiology

## 2011-06-15 ENCOUNTER — Other Ambulatory Visit: Payer: Self-pay | Admitting: *Deleted

## 2011-06-15 MED ORDER — TRANDOLAPRIL 2 MG PO TABS: 2.0000 mg | ORAL_TABLET | Freq: Two times a day (BID) | ORAL | Status: DC

## 2011-06-16 ENCOUNTER — Other Ambulatory Visit: Payer: Self-pay | Admitting: Gastroenterology

## 2011-06-21 DIAGNOSIS — Z803 Family history of malignant neoplasm of breast: Secondary | ICD-10-CM | POA: Diagnosis not present

## 2011-06-21 DIAGNOSIS — Z1231 Encounter for screening mammogram for malignant neoplasm of breast: Secondary | ICD-10-CM | POA: Diagnosis not present

## 2011-06-22 ENCOUNTER — Telehealth: Payer: Self-pay | Admitting: Gastroenterology

## 2011-06-22 MED ORDER — RABEPRAZOLE SODIUM 20 MG PO TBEC: 20.0000 mg | DELAYED_RELEASE_TABLET | Freq: Every day | ORAL | Status: DC

## 2011-06-22 NOTE — Telephone Encounter (Signed)
Patient states she needs the prescription sent for Aciphex to say 90 day supply to [facility] instead of 30. She states her copay is 95 dollars no matter what the prescription says. I told her I would resend the prescription to [facility].

## 2011-07-07 DIAGNOSIS — F329 Major depressive disorder, single episode, unspecified: Secondary | ICD-10-CM | POA: Diagnosis not present

## 2011-07-07 DIAGNOSIS — I10 Essential (primary) hypertension: Secondary | ICD-10-CM | POA: Diagnosis not present

## 2011-07-07 DIAGNOSIS — R079 Chest pain, unspecified: Secondary | ICD-10-CM | POA: Diagnosis not present

## 2011-07-07 DIAGNOSIS — F341 Dysthymic disorder: Secondary | ICD-10-CM | POA: Diagnosis not present

## 2011-07-07 DIAGNOSIS — K4090 Unilateral inguinal hernia, without obstruction or gangrene, not specified as recurrent: Secondary | ICD-10-CM | POA: Diagnosis not present

## 2011-07-07 DIAGNOSIS — R350 Frequency of micturition: Secondary | ICD-10-CM | POA: Diagnosis not present

## 2011-07-07 DIAGNOSIS — R5381 Other malaise: Secondary | ICD-10-CM | POA: Diagnosis not present

## 2011-07-08 ENCOUNTER — Ambulatory Visit (AMBULATORY_SURGERY_CENTER): Payer: Medicare Other | Admitting: *Deleted

## 2011-07-08 ENCOUNTER — Telehealth: Payer: Self-pay | Admitting: Cardiology

## 2011-07-08 VITALS — Ht 69.0 in | Wt 146.9 lb

## 2011-07-08 DIAGNOSIS — K2270 Barrett's esophagus without dysplasia: Secondary | ICD-10-CM

## 2011-07-08 DIAGNOSIS — Z1211 Encounter for screening for malignant neoplasm of colon: Secondary | ICD-10-CM

## 2011-07-08 MED ORDER — PEG-KCL-NACL-NASULF-NA ASC-C 100 G PO SOLR: ORAL | Status: DC

## 2011-07-08 NOTE — Telephone Encounter (Signed)
Patient called stated she continues to have hard heart beat and sometimes she feels heart beat speed up.States saw Norma Fredrickson NP 06/08/11 and metoprolol was increased to 25 mg 1/2 twice daily.States feels worse since she increased metoprolol.States legs are weak,has waves of weakness.Saw Dr.Green yesterday 07/07/11 and he prescribed antidepressant, has not started yet.States she is scheduled to have a endo and colonoscopy next Friday 07/16/11.States these symptoms all started when she had her ICD checked 05/20/11,had ICD rechecked 05/25/11 and was told ICD okay.Patient was told Dr.Jordan not in office this afternoon will tell him tomorrow 07/09/11 and call her back.

## 2011-07-08 NOTE — Telephone Encounter (Signed)
New msg Pt wants to talk to you about side effect of meds she is taking. She hasnt been feeling well and has been having overall aches and pains

## 2011-07-09 NOTE — Telephone Encounter (Signed)
Patient called no answer.Left message on personal voice mail spoke to Dr.Jordan he advised to go back to the way you use to take metoprolol.Advised to call back if needed.

## 2011-07-12 ENCOUNTER — Telehealth: Payer: Self-pay | Admitting: Gastroenterology

## 2011-07-12 NOTE — Telephone Encounter (Signed)
OK for EGD now and colon next year.

## 2011-07-12 NOTE — Telephone Encounter (Signed)
Patient states she has been feeling really bad for the last couple of weeks. She is not sure if her body can go through the bowel prep right now. Patient states she wants to cancel her Colonoscopy for Friday but keep her Endoscopy appt on Friday. Pt states she is not due for Recall Colonoscopy until next year anyway but was going to have it with the Endoscopy early to be sedated one time. Pt states now she would like to have it next year. Told her that I will let Dr. Russella Dar know and make sure a recall is in the system to contact her next year when it is closer to that time. Pt agreed and appt changed.

## 2011-07-16 ENCOUNTER — Other Ambulatory Visit: Payer: Medicare Other | Admitting: Gastroenterology

## 2011-07-16 ENCOUNTER — Encounter: Payer: Medicare Other | Admitting: Gastroenterology

## 2011-07-16 ENCOUNTER — Encounter: Payer: Self-pay | Admitting: Gastroenterology

## 2011-07-16 ENCOUNTER — Ambulatory Visit (AMBULATORY_SURGERY_CENTER): Payer: Medicare Other | Admitting: Gastroenterology

## 2011-07-16 VITALS — BP 157/81 | HR 55 | Temp 96.6°F | Resp 15 | Ht 69.0 in | Wt 146.0 lb

## 2011-07-16 DIAGNOSIS — K2990 Gastroduodenitis, unspecified, without bleeding: Secondary | ICD-10-CM

## 2011-07-16 DIAGNOSIS — K2270 Barrett's esophagus without dysplasia: Secondary | ICD-10-CM | POA: Diagnosis not present

## 2011-07-16 DIAGNOSIS — K2940 Chronic atrophic gastritis without bleeding: Secondary | ICD-10-CM

## 2011-07-16 DIAGNOSIS — K2970 Gastritis, unspecified, without bleeding: Secondary | ICD-10-CM

## 2011-07-16 DIAGNOSIS — K219 Gastro-esophageal reflux disease without esophagitis: Secondary | ICD-10-CM

## 2011-07-16 MED ORDER — SODIUM CHLORIDE 0.9 % IV SOLN: 500.0000 mL | INTRAVENOUS | Status: DC

## 2011-07-16 NOTE — Progress Notes (Signed)
Pt states she has no pain today but she has had abdominal pain over the last several months. It appears to be improving but pt wants this checked. ewm

## 2011-07-16 NOTE — Progress Notes (Signed)
Patient did not experience any of the following events: a burn prior to discharge; a fall within the facility; wrong site/side/patient/procedure/implant event; or a hospital transfer or hospital admission upon discharge from the facility. (G8907) Patient did not have preoperative order for IV antibiotic SSI prophylaxis. (G8918)  

## 2011-07-16 NOTE — Op Note (Signed)
[facility] [address] [place], [place]  [address]  ENDOSCOPY PROCEDURE REPORT PATIENT:  [name], [name]  MR#:  [id number] BIRTHDATE:  04-02-1932, 78 yrs. old  GENDER:  female ENDOSCOPIST:  Judie Petit T. Russella Dar, MD, [facility]  PROCEDURE DATE:  07/16/2011 PROCEDURE:  EGD with biopsy, 62[address] ASA CLASS:  Class III INDICATIONS:  Barrett's Esophagus MEDICATIONS:    These medications were titrated to patient response per physician's verbal order, Fentanyl 50 mcg IV, Versed 5 mg IV TOPICAL ANESTHETIC:  Cetacaine Spray DESCRIPTION OF PROCEDURE:   After the risks benefits and alternatives of the procedure were thoroughly explained, informed consent was obtained.  The LB [id number] endoscope was introduced through the mouth and advanced to the second portion of the duodenum, without limitations.  The instrument was slowly withdrawn as the mucosa was fully examined. <<PROCEDUREIMAGES>> Barrett's esophagus was found in the distal esophagus. It was 1 - 2 cm in length.  Four quadrant biopsies were taken every 1 cm through the length of the barretts.  Otherwise normal esophagus. Mild gastritis was found in the total stomach. It was erythematous, granular and atrophied. Multiple biopsies were obtained and sent to pathology.  Otherwise normal stomach.  The duodenal bulb was normal in appearance, as was the postbulbar duodenum. Retroflexed views revealed a hiatal hernia, small.  The scope was then withdrawn from the patient and the procedure completed.  COMPLICATIONS:  None  ENDOSCOPIC IMPRESSION: 1) Barrett's esophagus 2) Mild gastritis 3) Small hiatal hernia  RECOMMENDATIONS: 1) Anti-reflux regimen 2) Await pathology results 3) Continue PPI 4) EGD in 3 years for Barrett's surveillance if no dysplasia  Lynne Righi T. Russella Dar, MD, [facility]  CC:  Murray Hodgkins, MD  n. Rosalie DoctorVenita Lick. Calub Tarnow at 07/16/2011 11:54 AM  Tor Netters, [phone]

## 2011-07-16 NOTE — Patient Instructions (Signed)
Barrett's esophagus, mild gastritis, small hiatal hernia  Await pathology results, continue PPI  EGD in 3 years for Barrett's surveillance if no dysplasia.    YOU HAD AN ENDOSCOPIC PROCEDURE TODAY AT THE [facility]: Refer to the procedure report that was given to you for any specific questions about what was found during the examination.  If the procedure report does not answer your questions, please call your gastroenterologist to clarify.  If you requested that your care partner not be given the details of your procedure findings, then the procedure report has been included in a sealed envelope for you to review at your convenience later.  YOU SHOULD EXPECT: Some feelings of bloating in the abdomen. Passage of more gas than usual.  Walking can help get rid of the air that was put into your GI tract during the procedure and reduce the bloating. If you had a lower endoscopy (such as a colonoscopy or flexible sigmoidoscopy) you may notice spotting of blood in your stool or on the toilet paper. If you underwent a bowel prep for your procedure, then you may not have a normal bowel movement for a few days.  DIET: Your first meal following the procedure should be a light meal and then it is ok to progress to your normal diet.  A half-sandwich or bowl of soup is an example of a good first meal.  Heavy or fried foods are harder to digest and may make you feel nauseous or bloated.  Likewise meals heavy in dairy and vegetables can cause extra gas to form and this can also increase the bloating.  Drink plenty of fluids but you should avoid alcoholic beverages for 24 hours.  ACTIVITY: Your care partner should take you home directly after the procedure.  You should plan to take it easy, moving slowly for the rest of the day.  You can resume normal activity the day after the procedure however you should NOT DRIVE or use heavy machinery for 24 hours (because of the sedation medicines used during the  test).    SYMPTOMS TO REPORT IMMEDIATELY: A gastroenterologist can be reached at any hour.  During normal business hours, 8:30 AM to 5:00 PM Monday through Friday, call [phone].  After hours and on weekends, please call the GI answering service at [phone] who will take a message and have the physician on call contact you.   Following upper endoscopy (EGD)  Vomiting of blood or coffee ground material  New chest pain or pain under the shoulder blades  Painful or persistently difficult swallowing  New shortness of breath  Fever of 100F or higher  Black, tarry-looking stools  FOLLOW UP: If any biopsies were taken you will be contacted by phone or by letter within the next 1-3 weeks.  Call your gastroenterologist if you have not heard about the biopsies in 3 weeks.  Our staff will call the home number listed on your records the next business day following your procedure to check on you and address any questions or concerns that you may have at that time regarding the information given to you following your procedure. This is a courtesy call and so if there is no answer at the home number and we have not heard from you through the emergency physician on call, we will assume that you have returned to your regular daily activities without incident.  SIGNATURES/CONFIDENTIALITY: You and/or your care partner have signed paperwork which will be entered into your electronic medical  record.  These signatures attest to the fact that that the information above on your After Visit Summary has been reviewed and is understood.  Full responsibility of the confidentiality of this discharge information lies with you and/or your care-partner.

## 2011-07-19 ENCOUNTER — Telehealth: Payer: Self-pay | Admitting: *Deleted

## 2011-07-19 NOTE — Telephone Encounter (Signed)
  Follow up Call-  Call back number 07/16/2011  Post procedure Call Back phone  # [phone]  Permission to leave phone message Yes     Patient questions:  Do you have a fever, pain , or abdominal swelling? no Pain Score  0 *  Have you tolerated food without any problems? yes  Have you been able to return to your normal activities? yes  Do you have any questions about your discharge instructions: Diet   no Medications  no Follow up visit  no  Do you have questions or concerns about your Care? no  Actions: * If pain score is 4 or above: No action needed, pain <4.

## 2011-07-20 ENCOUNTER — Other Ambulatory Visit: Payer: Self-pay | Admitting: *Deleted

## 2011-07-20 MED ORDER — TRANDOLAPRIL 2 MG PO TABS: 2.0000 mg | ORAL_TABLET | Freq: Two times a day (BID) | ORAL | Status: DC

## 2011-07-21 DIAGNOSIS — R300 Dysuria: Secondary | ICD-10-CM | POA: Diagnosis not present

## 2011-07-22 ENCOUNTER — Encounter: Payer: Self-pay | Admitting: Gastroenterology

## 2011-08-06 ENCOUNTER — Other Ambulatory Visit: Payer: Self-pay | Admitting: Cardiology

## 2011-08-06 DIAGNOSIS — Z85820 Personal history of malignant melanoma of skin: Secondary | ICD-10-CM | POA: Diagnosis not present

## 2011-08-06 DIAGNOSIS — L821 Other seborrheic keratosis: Secondary | ICD-10-CM | POA: Diagnosis not present

## 2011-08-06 DIAGNOSIS — D239 Other benign neoplasm of skin, unspecified: Secondary | ICD-10-CM | POA: Diagnosis not present

## 2011-08-06 MED ORDER — DIGOXIN 125 MCG PO TABS: 125.0000 ug | ORAL_TABLET | Freq: Every day | ORAL | Status: DC

## 2011-08-13 ENCOUNTER — Encounter: Payer: Self-pay | Admitting: Cardiology

## 2011-08-13 ENCOUNTER — Telehealth: Payer: Self-pay | Admitting: Gastroenterology

## 2011-08-13 NOTE — Telephone Encounter (Signed)
Reviewed results with her all questions answered

## 2011-08-23 ENCOUNTER — Encounter: Payer: Self-pay | Admitting: Internal Medicine

## 2011-08-23 ENCOUNTER — Telehealth: Payer: Self-pay | Admitting: Cardiology

## 2011-08-23 ENCOUNTER — Ambulatory Visit (INDEPENDENT_AMBULATORY_CARE_PROVIDER_SITE_OTHER): Payer: Medicare Other | Admitting: *Deleted

## 2011-08-23 DIAGNOSIS — I428 Other cardiomyopathies: Secondary | ICD-10-CM | POA: Diagnosis not present

## 2011-08-23 LAB — ICD DEVICE OBSERVATION
AL AMPLITUDE: 2.1 mv
BATTERY VOLTAGE: 2.56 V
DEVICE MODEL ICD: 208554
RV LEAD AMPLITUDE: 12.1 mv
TZAT-0001FASTVT: 1
TZAT-0001FASTVT: 2
TZAT-0002FASTVT: NEGATIVE
TZAT-0004SLOWVT: 8
TZAT-0004SLOWVT: 8
TZAT-0005FASTVT: 81 pct
TZAT-0005SLOWVT: 81 pct
TZAT-0005SLOWVT: 81 pct
TZAT-0012SLOWVT: 200 ms
TZAT-0012SLOWVT: 200 ms
TZAT-0013FASTVT: 2
TZAT-0013SLOWVT: 2
TZAT-0013SLOWVT: 2
TZAT-0018FASTVT: NEGATIVE
TZAT-0018SLOWVT: NEGATIVE
TZAT-0018SLOWVT: NEGATIVE
TZAT-0019FASTVT: 7.5 V
TZAT-0019SLOWVT: 7.5 V
TZON-0003FASTVT: 300 ms
TZON-0003SLOWVT: 353 ms
TZON-0004SLOWVT: 2.5
TZON-0005FASTVT: 1
TZON-0010SLOWVT: 10 ms
TZST-0001FASTVT: 4
TZST-0001FASTVT: 6
TZST-0001SLOWVT: 4
TZST-0001SLOWVT: 6
TZST-0001SLOWVT: 7
TZST-0003FASTVT: 31 J
TZST-0003FASTVT: 31 J
TZST-0003SLOWVT: 31 J
TZST-0003SLOWVT: 31 J
VENTRICULAR PACING ICD: 99 pct

## 2011-08-23 NOTE — Progress Notes (Signed)
defib check in clinic  

## 2011-08-23 NOTE — Telephone Encounter (Signed)
Patient called was wanting to know if she needs echo before her appointment with Dr.Jordan 10/01/11.States she feels bad no energy.Patient was told will speak to Dr.Jordan tomorrow 08/24/11 and call her back.

## 2011-08-23 NOTE — Telephone Encounter (Signed)
New Problem:    Patient was at check out and wanted to know if she needed to schedule an ECHO for her next visit with Dr. Sw[place].  Please call back.

## 2011-08-24 ENCOUNTER — Ambulatory Visit: Payer: Medicare Other | Admitting: Nurse Practitioner

## 2011-08-25 ENCOUNTER — Other Ambulatory Visit: Payer: Self-pay

## 2011-08-25 DIAGNOSIS — I429 Cardiomyopathy, unspecified: Secondary | ICD-10-CM

## 2011-08-25 NOTE — Telephone Encounter (Signed)
Patient called was told spoke to Dr.Jordan he advised ok to do echo before appointment 10/01/11.

## 2011-09-17 DIAGNOSIS — R5383 Other fatigue: Secondary | ICD-10-CM | POA: Diagnosis not present

## 2011-09-17 DIAGNOSIS — I509 Heart failure, unspecified: Secondary | ICD-10-CM | POA: Diagnosis not present

## 2011-09-17 DIAGNOSIS — R5381 Other malaise: Secondary | ICD-10-CM | POA: Diagnosis not present

## 2011-09-17 DIAGNOSIS — I10 Essential (primary) hypertension: Secondary | ICD-10-CM | POA: Diagnosis not present

## 2011-09-17 DIAGNOSIS — E785 Hyperlipidemia, unspecified: Secondary | ICD-10-CM | POA: Diagnosis not present

## 2011-09-17 DIAGNOSIS — R350 Frequency of micturition: Secondary | ICD-10-CM | POA: Diagnosis not present

## 2011-09-17 DIAGNOSIS — E039 Hypothyroidism, unspecified: Secondary | ICD-10-CM | POA: Diagnosis not present

## 2011-09-21 ENCOUNTER — Other Ambulatory Visit: Payer: Self-pay | Admitting: Internal Medicine

## 2011-09-21 DIAGNOSIS — J329 Chronic sinusitis, unspecified: Secondary | ICD-10-CM | POA: Diagnosis not present

## 2011-09-21 DIAGNOSIS — C439 Malignant melanoma of skin, unspecified: Secondary | ICD-10-CM | POA: Diagnosis not present

## 2011-09-21 DIAGNOSIS — R5381 Other malaise: Secondary | ICD-10-CM | POA: Diagnosis not present

## 2011-09-21 DIAGNOSIS — R599 Enlarged lymph nodes, unspecified: Secondary | ICD-10-CM | POA: Diagnosis not present

## 2011-09-21 DIAGNOSIS — R1032 Left lower quadrant pain: Secondary | ICD-10-CM

## 2011-09-23 ENCOUNTER — Other Ambulatory Visit: Payer: Medicare Other

## 2011-09-24 ENCOUNTER — Ambulatory Visit (HOSPITAL_COMMUNITY)
Admission: RE | Admit: 2011-09-24 | Discharge: 2011-09-24 | Disposition: A | Discharge status: Home / Self Care | Payer: Medicare Other | Source: Ambulatory Visit | Attending: Internal Medicine | Admitting: Internal Medicine

## 2011-09-24 DIAGNOSIS — R599 Enlarged lymph nodes, unspecified: Secondary | ICD-10-CM | POA: Diagnosis not present

## 2011-09-24 DIAGNOSIS — M47817 Spondylosis without myelopathy or radiculopathy, lumbosacral region: Secondary | ICD-10-CM | POA: Insufficient documentation

## 2011-09-24 DIAGNOSIS — R1032 Left lower quadrant pain: Secondary | ICD-10-CM | POA: Diagnosis not present

## 2011-09-24 DIAGNOSIS — I319 Disease of pericardium, unspecified: Secondary | ICD-10-CM | POA: Diagnosis not present

## 2011-09-24 DIAGNOSIS — I700 Atherosclerosis of aorta: Secondary | ICD-10-CM | POA: Diagnosis not present

## 2011-09-24 DIAGNOSIS — C439 Malignant melanoma of skin, unspecified: Secondary | ICD-10-CM | POA: Diagnosis not present

## 2011-09-27 ENCOUNTER — Other Ambulatory Visit (HOSPITAL_COMMUNITY): Payer: Medicare Other

## 2011-09-27 ENCOUNTER — Ambulatory Visit (HOSPITAL_COMMUNITY): Payer: Medicare Other | Attending: Cardiology | Admitting: Radiology

## 2011-09-27 DIAGNOSIS — I517 Cardiomegaly: Secondary | ICD-10-CM | POA: Diagnosis not present

## 2011-09-27 DIAGNOSIS — I059 Rheumatic mitral valve disease, unspecified: Secondary | ICD-10-CM | POA: Diagnosis not present

## 2011-09-27 DIAGNOSIS — I429 Cardiomyopathy, unspecified: Secondary | ICD-10-CM

## 2011-09-27 DIAGNOSIS — I10 Essential (primary) hypertension: Secondary | ICD-10-CM | POA: Diagnosis not present

## 2011-09-27 DIAGNOSIS — I428 Other cardiomyopathies: Secondary | ICD-10-CM | POA: Diagnosis not present

## 2011-09-27 DIAGNOSIS — F17200 Nicotine dependence, unspecified, uncomplicated: Secondary | ICD-10-CM | POA: Insufficient documentation

## 2011-09-27 DIAGNOSIS — I447 Left bundle-branch block, unspecified: Secondary | ICD-10-CM | POA: Insufficient documentation

## 2011-09-27 DIAGNOSIS — R002 Palpitations: Secondary | ICD-10-CM | POA: Insufficient documentation

## 2011-09-27 NOTE — Progress Notes (Signed)
Echocardiogram performed.  

## 2011-10-01 ENCOUNTER — Encounter: Payer: Self-pay | Admitting: Cardiology

## 2011-10-01 ENCOUNTER — Ambulatory Visit (INDEPENDENT_AMBULATORY_CARE_PROVIDER_SITE_OTHER): Payer: Medicare Other | Admitting: Cardiology

## 2011-10-01 VITALS — BP 102/62 | HR 55 | Ht 69.0 in | Wt 147.5 lb

## 2011-10-01 DIAGNOSIS — I428 Other cardiomyopathies: Secondary | ICD-10-CM | POA: Diagnosis not present

## 2011-10-01 DIAGNOSIS — I509 Heart failure, unspecified: Secondary | ICD-10-CM

## 2011-10-01 DIAGNOSIS — I5022 Chronic systolic (congestive) heart failure: Secondary | ICD-10-CM

## 2011-10-01 DIAGNOSIS — I420 Dilated cardiomyopathy: Secondary | ICD-10-CM

## 2011-10-01 DIAGNOSIS — I4949 Other premature depolarization: Secondary | ICD-10-CM

## 2011-10-01 DIAGNOSIS — I493 Ventricular premature depolarization: Secondary | ICD-10-CM

## 2011-10-01 MED ORDER — FUROSEMIDE 20 MG PO TABS: 20.0000 mg | ORAL_TABLET | Freq: Every day | ORAL | Status: DC

## 2011-10-01 MED ORDER — POTASSIUM CHLORIDE CRYS ER 20 MEQ PO TBCR: 20.0000 meq | EXTENDED_RELEASE_TABLET | ORAL | Status: DC | PRN

## 2011-10-01 NOTE — Patient Instructions (Signed)
Continue your current medication  I will see you again in 4 months.   

## 2011-10-03 NOTE — Assessment & Plan Note (Signed)
[name] appears to be well compensated on exam. There is no evidence of volume overload. We will continue with her current therapy of digoxin, ARB, and low-dose metoprolol. We considered the use of Aldactone but given her low blood pressure I don't think she will tolerate this well. In reviewing her echocardiogram it appears that her ejection fraction has dropped about 5% over the past year. She will be considered for change out of her defibrillator as she is approaching elective replacement indicator. There is some consideration of just changing out to a CRT device only. If this is the case I would recommend repeating an echocardiogram before her change out to make sure that her ejection fraction has not dropped further.

## 2011-10-03 NOTE — Assessment & Plan Note (Signed)
We will continue with low-dose metoprolol. She is unable to tolerate higher doses. Her electrolytes are normal.

## 2011-10-03 NOTE — Progress Notes (Signed)
[name] Date of Birth: 11/23/1932 Medical Record #[id number]  History of Present Illness: Ms. Kitzmiller is seen today for a followup visit. She has a history of a dilated CM. EF initially down to 20%.  She had a dramatic response with Bi V therapy with improvement of ejection fraction to 50%.. Other issues include hypothyroidism, prior melanoma, gastritis, depression, anxiety, HLD  chronic LBBB, and PVCs..  On her last visit she had complained about increased palpitations. Her Lopressor dose was increased but she did not tolerate this due to excessive fatigue. She states she really hasn't felt well all year. She's had a sinus infection. She underwent endoscopy in May which demonstrated Barrett's esophagus and some mild gastritis. She then developed a knot in her left groin. She had a CT scan which showed no significant abnormality. She still complains that she feels tired particularly in the evening. She has had no sustained palpitations or dizziness.  Current Outpatient Prescriptions on File Prior to Visit  Medication Sig Dispense Refill  . aspirin 81 MG tablet Take 81 mg by mouth daily.        . Biotin 1000 MCG tablet Take 1,000 mcg by mouth. Take 2 tablets every day      . Calcium Carbonate-Vitamin D (CALCIUM + D PO) Take by mouth. Takes 1200mg  of Calcium and 800mg  Vit D every day      . citalopram (CELEXA) 20 MG tablet Take 20 mg by mouth daily.       . digoxin (LANOXIN) 0.125 MG tablet Take 1 tablet (125 mcg total) by mouth daily.  30 tablet  5  . ESTRACE VAGINAL 0.1 MG/GM vaginal cream       . estrogen-methylTESTOSTERone (ESTRATEST HS) 0.625-1.25 MG per tablet Take 1 tablet by mouth daily.        . furosemide (LASIX) 20 MG tablet Take 1 tablet (20 mg total) by mouth daily. As needed  30 tablet  3  . levothyroxine (SYNTHROID, LEVOTHROID) 112 MCG tablet Take 112 mcg by mouth daily.        . Magnesium 250 MG TABS Take by mouth daily.      . metoprolol tartrate (LOPRESSOR) 25 MG tablet  Take 12.5 mg by mouth daily.       . Multiple Vitamins-Minerals (CENTRUM SILVER PO) Take 1 tablet by mouth daily.        . potassium chloride SA (KLOR-CON M20) 20 MEQ tablet Take 1 tablet (20 mEq total) by mouth as needed (when taking Lasix).  30 tablet  3  . RABEprazole (ACIPHEX) 20 MG tablet Take 1 tablet (20 mg total) by mouth daily.  90 tablet  3  . trandolapril (MAVIK) 2 MG tablet Take 1 tablet (2 mg total) by mouth 2 (two) times daily.  180 tablet  3  . vitamin C (ASCORBIC ACID) 500 MG tablet Take 500 mg by mouth daily.      Marland Kitchen DISCONTD: omeprazole (PRILOSEC) 20 MG capsule Take 1 capsule (20 mg total) by mouth daily.  30 capsule  11    Allergies  Allergen Reactions  . Cephalexin Swelling  . Doxycycline Other (See Comments)    Per pt: unknown  . Ofloxacin Other (See Comments)    Per pt: unknown    Past Medical History  Diagnosis Date  . Nonischemic cardiomyopathy     EF initially 20%; Last measurement up to 50% per echo in August of 2012  . CHF (congestive heart failure)   . LBBB (left bundle  branch block)   . Mitral regurgitation   . Melanoma   . Hypothyroidism   . Erosive gastritis 08/2004  . Hyperplastic polyps of stomach 06/2002  . Barrett's esophagus 04/2008  . GERD (gastroesophageal reflux disease)   . Diverticulitis   . Anxiety disorder   . Sleep apnea   . HTN (hypertension)   . ICD (implantable cardiac defibrillator) in place     BiV/ICD    Past Surgical History  Procedure Date  . Defibrillator insertion   . Arm surgery     left  . Abdominal hysterectomy   . Breast lumpectomy 1988    ? side per pt  . Appendectomy   . Tonsillectomy   . Right knee surgery     History  Smoking status  . Former Smoker  Smokeless tobacco  . Never Used    History  Alcohol Use No    Family History  Problem Relation Age of Onset  . Heart disease Other     maternal side  . Colon cancer Paternal Uncle   . Breast cancer Maternal Aunt   . Diabetes Neg Hx     Review  of Systems: The review of systems is positive for palpitations and fatigue.  All other systems were reviewed and are negative.  Physical Exam: BP 102/62  Pulse 55  Ht 5\' 9"  (1.753 m)  Wt 147 lb 8 oz (66.906 kg)  BMI 21.78 kg/m2  SpO2 98% Patient is pleasant and in no acute distress. Seems a little anxious. Skin is warm and dry. Color is normal.  HEENT is unremarkable. Normocephalic/atraumatic. PERRL. Sclera are nonicteric. Neck is supple. No masses. No JVD. Lungs are clear but with some scattered wheezes throughout. Cardiac exam shows a regular rate and rhythm. Abdomen is soft. Extremities are without edema. Gait and ROM are intact. No gross neurologic deficits noted.  LABORATORY DATA:  Dated 09/17/2011 CBC was normal. Complete chemistry panel was normal. Dig level was 0.7. Total cholesterol 200, triglycerides 61, HDL 54, LDL 134. Urinalysis was negative.  Transthoracic Echocardiography  Patient: Sparkle, Aube MR #: [id number] Study Date: 09/27/2011 Gender: F Age: 48 Height: 175.3cm Weight: 66.2kg BSA: 1.71m^2 Pt. Status: Room:  ORDERING Sw[place], Peter REFERRING [place], Peter ATTENDING Olga Millers PERFORMING [facility], [facility] SONOGRAPHER Junious Dresser, RDCS cc:  ------------------------------------------------------------ LV EF: 40% - 45%  ------------------------------------------------------------ Indications: 425.4 Other primary Cardiomyopathies. 424.0 Mitral valve disease. Cardiomyopathy - dilated 425.4.  ------------------------------------------------------------ History: PMH: Acquired from the patient and from the patient's chart. Palpitations. Left bundle branch block. Non-ischemic cardiomyopathy, with congestive heart failure, with an ejection fraction of 55%by echocardiography. The dysfunction is primarily systolic. Dilated cardiomyopathy. Mild mitral regurgitation. Risk factors: Current tobacco use.  Hypertension.  ------------------------------------------------------------ Study Conclusions  - Left ventricle: The cavity size was normal. Wall thickness was normal. Systolic function was mildly to moderately reduced. The estimated ejection fraction was in the range of 40% to 45%. Diffuse hypokinesis. Left ventricular diastolic function parameters were normal. - Ventricular septum: These changes are consistent with a left bundle branch block. - Aortic valve: Trivial regurgitation. - Mitral valve: Mild regurgitation. - Left atrium: The atrium was mildly dilated. - Pericardium, extracardiac: A trivial pericardial effusion was identified.  ------------------------------------------------------------ Labs, prior tests, procedures, and surgery: Echocardiography (August 2012). The mitral valve showed mild to moderate regurgitation. The aortic valve showed < 1+ regurgitation. EF was 55%. Right atrial and ventricular enlargement.  ICD system implantation. Currently implanted device: biventricular cardioverter defibrillator. Transthoracic echocardiography. M-mode, complete 2D,  spectral Doppler, and color Doppler. Height: Height: 175.3cm. Height: 69in. Weight: Weight: 66.2kg. Weight: 145.7lb. Body mass index: BMI: 21.6kg/m^2. Body surface area: BSA: 1.29m^2. Blood pressure: 120/75. Patient status: Outpatient. Location: [facility]  ------------------------------------------------------------  ------------------------------------------------------------ Left ventricle: The cavity size was normal. Wall thickness was normal. Systolic function was mildly to moderately reduced. The estimated ejection fraction was in the range of 40% to 45%. Diffuse hypokinesis. The transmitral flow pattern was normal. The deceleration time of the early transmitral flow velocity was normal. The pulmonary vein flow pattern was normal. The tissue Doppler parameters were normal. Left ventricular  diastolic function parameters were normal.  ------------------------------------------------------------ Aortic valve: Trileaflet; mildly thickened leaflets. Doppler: There was no stenosis. Trivial regurgitation.  ------------------------------------------------------------ Aorta: Aortic root: The aortic root was normal in size.  ------------------------------------------------------------ Mitral valve: Structurally normal valve. Leaflet separation was normal. Doppler: Transvalvular velocity was within the normal range. There was no evidence for stenosis. Mild regurgitation. Peak gradient: 3mm Hg (D).  ------------------------------------------------------------ Left atrium: The atrium was mildly dilated.  ------------------------------------------------------------ Right ventricle: The cavity size was normal. Pacer wire or catheter noted in right ventricle. Systolic function was normal.  ------------------------------------------------------------ Ventricular septum: These changes are consistent with a left bundle branch block.  ------------------------------------------------------------ Pulmonic valve: Structurally normal valve. Cusp separation was normal. Doppler: Transvalvular velocity was within the normal range. Trivial regurgitation.  ------------------------------------------------------------ Tricuspid valve: Structurally normal valve. Leaflet separation was normal. Doppler: Transvalvular velocity was within the normal range. Mild regurgitation.  ------------------------------------------------------------ Pulmonary artery: Systolic pressure was within the normal range.  ------------------------------------------------------------ Right atrium: The atrium was normal in size. Pacer wire or catheter noted in right atrium.  ------------------------------------------------------------ Pericardium: A trivial pericardial effusion was  identified.  ------------------------------------------------------------ Systemic veins: Inferior vena cava: The vessel was normal in size; the respirophasic diameter changes were in the normal range (= 50%); findings are consistent with normal central venous pressure.  ------------------------------------------------------------  2D measurements Normal Doppler measurements Normal Left ventricle Main pulmonary LVID ED, 48.8 mm 43-52 artery chord, Pressure, 27 mm Hg =30 PLAX S LVID ES, 35.1 mm 23-38 Left ventricle chord, Ea, lat 8.44 cm/s ------ PLAX ann, tiss FS, chord, 28 % >29 DP PLAX E/Ea, lat 10.52 ------ LVPW, ED 11.4 mm ------ ann, tiss IVS/LVPW 0.85 <1.3 DP ratio, ED Ea, med 4.68 cm/s ------ Ventricular septum ann, tiss IVS, ED 9.71 mm ------ DP LVOT E/Ea, med 18.97 ------ Diam, S 21 mm ------ ann, tiss Area 3.46 cm^2 ------ DP Diam 21 mm ------ LVOT Aorta Peak vel, 108 cm/s ------ Root diam, 35 mm ------ S ED VTI, S 24.2 cm ------ Left atrium Stroke vol 83.8 ml ------ AP dim 41 mm ------ Stroke 46.3 ml/m^ ------ AP dim 2.27 cm/m^2 <2.2 index 2 index Mitral valve Peak E vel 88.8 cm/s ------ Peak A vel 49.4 cm/s ------ Decelerati 169 ms 150-23 on time 0 Peak 3 mm Hg ------ gradient, D Peak E/A 1.8 ------ ratio Tricuspid valve Regurg 236 cm/s ------ peak vel Peak RV-RA 22 mm Hg ------ gradient, S Systemic veins Estimated 5 mm Hg ------ CVP Right ventricle Pressure, 27 mm Hg <30 S Sa vel, 12.7 cm/s ------ lat ann, tiss DP  ------------------------------------------------------------ Prepared and Electronically Authenticated by  Olga Millers [id number]  Assessment / Plan:

## 2011-10-05 ENCOUNTER — Encounter: Payer: Self-pay | Admitting: Cardiology

## 2011-11-09 ENCOUNTER — Encounter: Payer: Self-pay | Admitting: *Deleted

## 2011-11-18 ENCOUNTER — Encounter: Payer: Self-pay | Admitting: Cardiology

## 2011-11-18 ENCOUNTER — Ambulatory Visit (INDEPENDENT_AMBULATORY_CARE_PROVIDER_SITE_OTHER): Payer: Medicare Other | Admitting: Cardiology

## 2011-11-18 VITALS — BP 112/60 | HR 55 | Ht 69.0 in | Wt 145.1 lb

## 2011-11-18 DIAGNOSIS — I5022 Chronic systolic (congestive) heart failure: Secondary | ICD-10-CM

## 2011-11-18 DIAGNOSIS — Z95810 Presence of automatic (implantable) cardiac defibrillator: Secondary | ICD-10-CM | POA: Diagnosis not present

## 2011-11-18 NOTE — Progress Notes (Signed)
Device check only. See [facility] report. 

## 2011-11-19 LAB — ICD DEVICE OBSERVATION
AL AMPLITUDE: 2.1 mv
AL IMPEDENCE ICD: 631 Ohm
ATRIAL PACING ICD: 76 pct
BAMS-0001: 170 {beats}/min
DEV-0020ICD: NEGATIVE
DEVICE MODEL ICD: 208554
HV IMPEDENCE: 42 Ohm
LV LEAD THRESHOLD: 1.2 V
RV LEAD THRESHOLD: 1.4 V
TZAT-0001FASTVT: 1
TZAT-0001FASTVT: 2
TZAT-0001SLOWVT: 1
TZAT-0001SLOWVT: 2
TZAT-0002FASTVT: NEGATIVE
TZAT-0004FASTVT: 8
TZAT-0005FASTVT: 81 pct
TZAT-0013SLOWVT: 2
TZAT-0018FASTVT: NEGATIVE
TZAT-0018SLOWVT: NEGATIVE
TZAT-0018SLOWVT: NEGATIVE
TZAT-0019SLOWVT: 7.5 V
TZAT-0020SLOWVT: 1 ms
TZON-0003SLOWVT: 353 ms
TZON-0005FASTVT: 1
TZON-0008SLOWVT: 3:0 {titer}
TZON-0010SLOWVT: 10 ms
TZST-0001FASTVT: 3
TZST-0001FASTVT: 5
TZST-0001FASTVT: 6
TZST-0001FASTVT: 7
TZST-0001SLOWVT: 3
TZST-0001SLOWVT: 5
TZST-0001SLOWVT: 7
TZST-0003FASTVT: 31 J
TZST-0003FASTVT: 31 J
TZST-0003SLOWVT: 31 J
TZST-0003SLOWVT: 31 J

## 2011-12-24 DIAGNOSIS — R5383 Other fatigue: Secondary | ICD-10-CM | POA: Diagnosis not present

## 2011-12-24 DIAGNOSIS — E039 Hypothyroidism, unspecified: Secondary | ICD-10-CM | POA: Diagnosis not present

## 2011-12-24 DIAGNOSIS — I10 Essential (primary) hypertension: Secondary | ICD-10-CM | POA: Diagnosis not present

## 2011-12-24 DIAGNOSIS — E785 Hyperlipidemia, unspecified: Secondary | ICD-10-CM | POA: Diagnosis not present

## 2011-12-28 DIAGNOSIS — J329 Chronic sinusitis, unspecified: Secondary | ICD-10-CM | POA: Diagnosis not present

## 2011-12-28 DIAGNOSIS — F341 Dysthymic disorder: Secondary | ICD-10-CM | POA: Diagnosis not present

## 2011-12-28 DIAGNOSIS — E039 Hypothyroidism, unspecified: Secondary | ICD-10-CM | POA: Diagnosis not present

## 2011-12-28 DIAGNOSIS — Z23 Encounter for immunization: Secondary | ICD-10-CM | POA: Diagnosis not present

## 2012-01-31 ENCOUNTER — Encounter: Payer: Self-pay | Admitting: Cardiology

## 2012-01-31 ENCOUNTER — Ambulatory Visit (INDEPENDENT_AMBULATORY_CARE_PROVIDER_SITE_OTHER): Payer: Medicare Other | Admitting: Cardiology

## 2012-01-31 VITALS — BP 140/70 | HR 73 | Ht 68.5 in | Wt 145.0 lb

## 2012-01-31 DIAGNOSIS — I4949 Other premature depolarization: Secondary | ICD-10-CM | POA: Diagnosis not present

## 2012-01-31 DIAGNOSIS — I5022 Chronic systolic (congestive) heart failure: Secondary | ICD-10-CM

## 2012-01-31 DIAGNOSIS — Z95810 Presence of automatic (implantable) cardiac defibrillator: Secondary | ICD-10-CM

## 2012-01-31 DIAGNOSIS — I493 Ventricular premature depolarization: Secondary | ICD-10-CM

## 2012-01-31 NOTE — Patient Instructions (Signed)
Continue your current therapy  I will see you again in 6 months.   

## 2012-01-31 NOTE — Progress Notes (Signed)
[name] Date of Birth: 1932-11-12 Medical Record #[id number]  History of Present Illness: [name] is seen today for a followup visit. She has a history of a dilated CM. EF initially down to 20%.  She had a dramatic response with Bi V therapy with improvement of ejection fraction to 50%. Her most recent echocardiogram showed an ejection fraction of 40-45%. Other issues include hypothyroidism, prior melanoma, gastritis, depression, anxiety, HLD  chronic LBBB, and PVCs..  She has done well from a cardiac standpoint since her last visit. She did note some symptoms of fatigue. Her rate responsiveness was turned on on her last ICD check. This seemed to have improved her symptoms. She denies any increased palpitations or shortness of breath.  Current Outpatient Prescriptions on File Prior to Visit  Medication Sig Dispense Refill  . Biotin 1000 MCG tablet Take 1,000 mcg by mouth. Take 2 tablets every day      . Calcium Carbonate-Vitamin D (CALCIUM + D PO) Take by mouth. Takes 1200mg  of Calcium and 800mg  Vit D every day      . digoxin (LANOXIN) 0.125 MG tablet Take 1 tablet (125 mcg total) by mouth daily.  30 tablet  5  . estrogen-methylTESTOSTERone (ESTRATEST HS) 0.625-1.25 MG per tablet Take 1 tablet by mouth daily.        . furosemide (LASIX) 20 MG tablet Take 1 tablet (20 mg total) by mouth daily. As needed  30 tablet  3  . levothyroxine (SYNTHROID, LEVOTHROID) 112 MCG tablet Take 112 mcg by mouth daily.        . Magnesium 250 MG TABS Take by mouth daily.      . metoprolol tartrate (LOPRESSOR) 25 MG tablet Take 12.5 mg by mouth daily.       . Multiple Vitamins-Minerals (CENTRUM SILVER PO) Take 1 tablet by mouth daily.        . potassium chloride SA (KLOR-CON M20) 20 MEQ tablet Take 1 tablet (20 mEq total) by mouth as needed (when taking Lasix).  30 tablet  3  . RABEprazole (ACIPHEX) 20 MG tablet Take 1 tablet (20 mg total) by mouth daily.  90 tablet  3  . trandolapril (MAVIK) 2 MG tablet  Take 1 tablet (2 mg total) by mouth 2 (two) times daily.  180 tablet  3  . vitamin C (ASCORBIC ACID) 500 MG tablet Take 500 mg by mouth daily.      . [DISCONTINUED] omeprazole (PRILOSEC) 20 MG capsule Take 1 capsule (20 mg total) by mouth daily.  30 capsule  11    Allergies  Allergen Reactions  . Cephalexin Swelling  . Doxycycline Other (See Comments)    Per pt: unknown  . Ofloxacin Other (See Comments)    Per pt: unknown    Past Medical History  Diagnosis Date  . Nonischemic cardiomyopathy     EF initially 20%; Last measurement up to 50% per echo in August of 2012  . CHF (congestive heart failure)   . LBBB (left bundle branch block)   . Mitral regurgitation   . Melanoma   . Hypothyroidism   . Erosive gastritis 08/2004  . Hyperplastic polyps of stomach 06/2002  . Barrett's esophagus 04/2008  . GERD (gastroesophageal reflux disease)   . Diverticulitis   . Anxiety disorder   . Sleep apnea   . HTN (hypertension)   . ICD (implantable cardiac defibrillator) in place     BiV/ICD    Past Surgical History  Procedure Date  .  Defibrillator insertion   . Arm surgery     left  . Abdominal hysterectomy   . Breast lumpectomy 1988    ? side per pt  . Appendectomy   . Tonsillectomy   . Right knee surgery     History  Smoking status  . Former Smoker  Smokeless tobacco  . Never Used    History  Alcohol Use No    Family History  Problem Relation Age of Onset  . Heart disease Other     maternal side  . Colon cancer Paternal Uncle   . Breast cancer Maternal Aunt   . Diabetes Neg Hx     Review of Systems: The review of systems is positive for increased redness in her hands. She is concerned about bags under her eyes. She thinks she looks old and doesn't want to go out in public. She was prescribed an antidepressant but hasn't started on it yet. All other systems were reviewed and are negative.  Physical Exam: BP 140/70  Pulse 73  Ht 5' 8.5" (1.74 m)  Wt 145 lb  (65.772 kg)  BMI 21.73 kg/m2  SpO2 93% Patient is pleasant and in no acute distress. She is anxious. Skin is warm and dry. Color is normal.  HEENT is unremarkable. Normocephalic/atraumatic. PERRL. Sclera are nonicteric. Neck is supple. No masses. No JVD. Lungs are clear but with some scattered wheezes throughout. Cardiac exam shows a regular rate and rhythm. Abdomen is soft. Extremities are without edema. Gait and ROM are intact. No gross neurologic deficits noted.  LABORATORY DATA:   Assessment / Plan: 1. Chronic congestive heart failure with nonischemic cardiomyopathy. Ejection fraction of 45-50%. She is on appropriate therapy with Lanoxin, metoprolol, and trandolapril. She uses Lasix as needed. We will continue her current therapy and followup again in 6 months.  2. Status post biventricular ICD.  3. PVCs.

## 2012-02-08 DIAGNOSIS — Z85820 Personal history of malignant melanoma of skin: Secondary | ICD-10-CM | POA: Diagnosis not present

## 2012-02-08 DIAGNOSIS — L723 Sebaceous cyst: Secondary | ICD-10-CM | POA: Diagnosis not present

## 2012-02-08 DIAGNOSIS — L821 Other seborrheic keratosis: Secondary | ICD-10-CM | POA: Diagnosis not present

## 2012-02-08 DIAGNOSIS — D1801 Hemangioma of skin and subcutaneous tissue: Secondary | ICD-10-CM | POA: Diagnosis not present

## 2012-02-08 DIAGNOSIS — L819 Disorder of pigmentation, unspecified: Secondary | ICD-10-CM | POA: Diagnosis not present

## 2012-02-08 DIAGNOSIS — L219 Seborrheic dermatitis, unspecified: Secondary | ICD-10-CM | POA: Diagnosis not present

## 2012-02-15 ENCOUNTER — Encounter: Payer: Medicare Other | Admitting: Internal Medicine

## 2012-02-16 ENCOUNTER — Encounter: Payer: Medicare Other | Admitting: Internal Medicine

## 2012-02-18 ENCOUNTER — Encounter: Payer: Self-pay | Admitting: Internal Medicine

## 2012-02-18 ENCOUNTER — Ambulatory Visit (INDEPENDENT_AMBULATORY_CARE_PROVIDER_SITE_OTHER): Payer: Medicare Other | Admitting: Internal Medicine

## 2012-02-18 VITALS — BP 135/75 | HR 57 | Ht 68.5 in | Wt 144.8 lb

## 2012-02-18 DIAGNOSIS — Z95810 Presence of automatic (implantable) cardiac defibrillator: Secondary | ICD-10-CM | POA: Diagnosis not present

## 2012-02-18 DIAGNOSIS — I5022 Chronic systolic (congestive) heart failure: Secondary | ICD-10-CM | POA: Diagnosis not present

## 2012-02-18 LAB — ICD DEVICE OBSERVATION
AL THRESHOLD: 0.6 V
ATRIAL PACING ICD: 77 pct
BAMS-0001: 170 {beats}/min
DEV-0020ICD: NEGATIVE
FVT: 0
HV IMPEDENCE: 42 Ohm
LV LEAD AMPLITUDE: 5.5 mv
RV LEAD IMPEDENCE ICD: 530 Ohm
TZAT-0001FASTVT: 1
TZAT-0001FASTVT: 2
TZAT-0001SLOWVT: 1
TZAT-0001SLOWVT: 2
TZAT-0002FASTVT: NEGATIVE
TZAT-0005FASTVT: 81 pct
TZAT-0005SLOWVT: 81 pct
TZAT-0012SLOWVT: 200 ms
TZAT-0013SLOWVT: 2
TZAT-0018SLOWVT: NEGATIVE
TZAT-0018SLOWVT: NEGATIVE
TZAT-0019FASTVT: 7.5 V
TZAT-0019SLOWVT: 7.5 V
TZAT-0019SLOWVT: 7.5 V
TZAT-0020FASTVT: 1 ms
TZAT-0020SLOWVT: 1 ms
TZON-0003FASTVT: 300 ms
TZON-0003SLOWVT: 353 ms
TZON-0004SLOWVT: 2.5
TZON-0005FASTVT: 1
TZON-0005SLOWVT: 1
TZON-0010SLOWVT: 10 ms
TZST-0001FASTVT: 4
TZST-0001FASTVT: 7
TZST-0001SLOWVT: 4
TZST-0001SLOWVT: 5
TZST-0001SLOWVT: 7
TZST-0003FASTVT: 31 J
TZST-0003FASTVT: 31 J
TZST-0003SLOWVT: 23 J
TZST-0003SLOWVT: 31 J
VF: 0

## 2012-02-18 NOTE — Assessment & Plan Note (Signed)
Her symptoms are class II. I've recommended that she undergo 2-D echo to better evaluate her left ventricular function, prior to a biventricular device removal and insertion.

## 2012-02-18 NOTE — Assessment & Plan Note (Signed)
Her [employer] ICD is working normally. It is my expectation that we will place a biventricular pacemaker at device change out. This will be provided her left ventricular function remains relatively preserved.

## 2012-02-18 NOTE — Patient Instructions (Signed)
Your physician recommends that you schedule a follow-up appointment in: February with the device clinic on a day Dr Ladona Ridgel is here   Your physician has requested that you have an echocardiogram. Echocardiography is a painless test that uses sound waves to create images of your heart. It provides your doctor with information about the size and shape of your heart and how well your heart's chambers and valves are working. This procedure takes approximately one hour. There are no restrictions for this procedure.

## 2012-02-18 NOTE — Progress Notes (Signed)
HPI [name] returns today for followup. She is a very pleasant 78 year old woman with a nonischemic cardiomyopathy, left bundle branch block, chronic systolic heart failure, who had near normalization of her left ventricular function with biventricular pacing. In the interim she has done well. She denies chest pain or shortness of breath. Her only complaint today is a prior upper start her dose which is slow to resolve.  Allergies  Allergen Reactions  . Cephalexin Swelling  . Doxycycline Other (See Comments)    Per pt: unknown  . Ofloxacin Other (See Comments)    Per pt: unknown     Current Outpatient Prescriptions  Medication Sig Dispense Refill  . Biotin 1000 MCG tablet Take 1,000 mcg by mouth. Take 2 tablets every day      . Calcium Carbonate-Vitamin D (CALCIUM + D PO) Take by mouth. Takes 1200mg  of Calcium and 800mg  Vit D every day      . Coenzyme Q10 (CO Q-10 PO) Take 100 mg by mouth as directed.      . Cyanocobalamin (VITAMIN B 12 PO) Take by mouth.      . digoxin (LANOXIN) 0.125 MG tablet Take 1 tablet (125 mcg total) by mouth daily.  30 tablet  5  . estrogen-methylTESTOSTERone (ESTRATEST HS) 0.625-1.25 MG per tablet Take 1 tablet by mouth daily.        . furosemide (LASIX) 20 MG tablet Take 1 tablet (20 mg total) by mouth daily. As needed  30 tablet  3  . levothyroxine (SYNTHROID, LEVOTHROID) 112 MCG tablet Take 112 mcg by mouth daily.        . Magnesium 250 MG TABS Take by mouth daily.      . metoprolol tartrate (LOPRESSOR) 25 MG tablet Take 12.5 mg by mouth daily.       . Multiple Vitamins-Minerals (CENTRUM SILVER PO) Take 1 tablet by mouth daily.        . potassium chloride SA (KLOR-CON M20) 20 MEQ tablet Take 1 tablet (20 mEq total) by mouth as needed (when taking Lasix).  30 tablet  3  . RABEprazole (ACIPHEX) 20 MG tablet Take 1 tablet (20 mg total) by mouth daily.  90 tablet  3  . trandolapril (MAVIK) 2 MG tablet Take 1 tablet (2 mg total) by mouth 2 (two) times daily.   180 tablet  3  . vitamin C (ASCORBIC ACID) 500 MG tablet Take 500 mg by mouth daily.      . [DISCONTINUED] omeprazole (PRILOSEC) 20 MG capsule Take 1 capsule (20 mg total) by mouth daily.  30 capsule  11     Past Medical History  Diagnosis Date  . Nonischemic cardiomyopathy     EF initially 20%; Last measurement up to 50% per echo in August of 2012  . CHF (congestive heart failure)   . LBBB (left bundle branch block)   . Mitral regurgitation   . Melanoma   . Hypothyroidism   . Erosive gastritis 08/2004  . Hyperplastic polyps of stomach 06/2002  . Barrett's esophagus 04/2008  . GERD (gastroesophageal reflux disease)   . Diverticulitis   . Anxiety disorder   . Sleep apnea   . HTN (hypertension)   . ICD (implantable cardiac defibrillator) in place     BiV/ICD    ROS:   All systems reviewed and negative except as noted in the HPI.   Past Surgical History  Procedure Date  . Defibrillator insertion   . Arm surgery     left  .  Abdominal hysterectomy   . Breast lumpectomy 1988    ? side per pt  . Appendectomy   . Tonsillectomy   . Right knee surgery      Family History  Problem Relation Age of Onset  . Heart disease Other     maternal side  . Colon cancer Paternal Uncle   . Breast cancer Maternal Aunt   . Diabetes Neg Hx      History   Social History  . Marital Status: Single    Spouse Name: N/A    Number of Children: 0  . Years of Education: N/A   Occupational History  . Retired   .     Social History Main Topics  . Smoking status: Former Games developer  . Smokeless tobacco: Never Used  . Alcohol Use: No  . Drug Use: No  . Sexually Active: No   Other Topics Concern  . Not on file   Social History Narrative  . No narrative on file     BP 135/75  Pulse 57  Ht 5' 8.5" (1.74 m)  Wt 144 lb 12.8 oz (65.681 kg)  BMI 21.70 kg/m2  Physical Exam:  Well appearing 77 year old woman, NAD HEENT: Unremarkable Neck:  7 cm JVD, no thyromegally Lungs:  Clear  with no wheezes, rales, or rhonchi. HEART:  Regular rate rhythm, no murmurs, no rubs, no clicks Abd:  soft, positive bowel sounds, no organomegally, no rebound, no guarding Ext:  2 plus pulses, no edema, no cyanosis, no clubbing Skin:  No rashes no nodules Neuro:  CN II through XII intact, motor grossly intact  DEVICE  Normal device function.  See PaceArt for details. She is approaching elective replacement  Assess/Plan:

## 2012-03-03 ENCOUNTER — Telehealth: Payer: Self-pay | Admitting: Internal Medicine

## 2012-03-03 ENCOUNTER — Ambulatory Visit (INDEPENDENT_AMBULATORY_CARE_PROVIDER_SITE_OTHER): Payer: Medicare Other | Admitting: *Deleted

## 2012-03-03 DIAGNOSIS — I428 Other cardiomyopathies: Secondary | ICD-10-CM

## 2012-03-03 NOTE — Telephone Encounter (Signed)
Patient will be checked in office today.

## 2012-03-03 NOTE — Telephone Encounter (Signed)
Pt defib battery is getting low and she want to talk to you because it starting beeping last night so we may be closer than we think

## 2012-03-21 ENCOUNTER — Ambulatory Visit (HOSPITAL_COMMUNITY): Payer: Medicare Other | Attending: Cardiology | Admitting: Radiology

## 2012-03-21 DIAGNOSIS — I379 Nonrheumatic pulmonary valve disorder, unspecified: Secondary | ICD-10-CM | POA: Insufficient documentation

## 2012-03-21 DIAGNOSIS — I509 Heart failure, unspecified: Secondary | ICD-10-CM

## 2012-03-21 DIAGNOSIS — I080 Rheumatic disorders of both mitral and aortic valves: Secondary | ICD-10-CM | POA: Insufficient documentation

## 2012-03-21 DIAGNOSIS — I10 Essential (primary) hypertension: Secondary | ICD-10-CM | POA: Diagnosis not present

## 2012-03-21 DIAGNOSIS — I428 Other cardiomyopathies: Secondary | ICD-10-CM | POA: Diagnosis not present

## 2012-03-21 DIAGNOSIS — I369 Nonrheumatic tricuspid valve disorder, unspecified: Secondary | ICD-10-CM | POA: Insufficient documentation

## 2012-03-21 DIAGNOSIS — I5022 Chronic systolic (congestive) heart failure: Secondary | ICD-10-CM

## 2012-03-21 NOTE — Progress Notes (Signed)
Echocardiogram performed.  

## 2012-03-23 DIAGNOSIS — B373 Candidiasis of vulva and vagina: Secondary | ICD-10-CM | POA: Diagnosis not present

## 2012-03-23 DIAGNOSIS — Z124 Encounter for screening for malignant neoplasm of cervix: Secondary | ICD-10-CM | POA: Diagnosis not present

## 2012-03-27 DIAGNOSIS — E785 Hyperlipidemia, unspecified: Secondary | ICD-10-CM | POA: Diagnosis not present

## 2012-03-27 DIAGNOSIS — F329 Major depressive disorder, single episode, unspecified: Secondary | ICD-10-CM | POA: Diagnosis not present

## 2012-03-27 DIAGNOSIS — I10 Essential (primary) hypertension: Secondary | ICD-10-CM | POA: Diagnosis not present

## 2012-03-27 DIAGNOSIS — R002 Palpitations: Secondary | ICD-10-CM | POA: Diagnosis not present

## 2012-03-27 DIAGNOSIS — E039 Hypothyroidism, unspecified: Secondary | ICD-10-CM | POA: Diagnosis not present

## 2012-03-27 DIAGNOSIS — I509 Heart failure, unspecified: Secondary | ICD-10-CM | POA: Diagnosis not present

## 2012-03-27 DIAGNOSIS — R5383 Other fatigue: Secondary | ICD-10-CM | POA: Diagnosis not present

## 2012-03-27 DIAGNOSIS — I429 Cardiomyopathy, unspecified: Secondary | ICD-10-CM | POA: Diagnosis not present

## 2012-03-29 DIAGNOSIS — E039 Hypothyroidism, unspecified: Secondary | ICD-10-CM | POA: Diagnosis not present

## 2012-03-29 DIAGNOSIS — I10 Essential (primary) hypertension: Secondary | ICD-10-CM | POA: Diagnosis not present

## 2012-03-29 DIAGNOSIS — J42 Unspecified chronic bronchitis: Secondary | ICD-10-CM | POA: Diagnosis not present

## 2012-03-29 DIAGNOSIS — I428 Other cardiomyopathies: Secondary | ICD-10-CM | POA: Diagnosis not present

## 2012-04-05 ENCOUNTER — Ambulatory Visit (INDEPENDENT_AMBULATORY_CARE_PROVIDER_SITE_OTHER): Payer: Medicare Other | Admitting: Internal Medicine

## 2012-04-05 ENCOUNTER — Encounter: Payer: Self-pay | Admitting: Cardiology

## 2012-04-05 ENCOUNTER — Encounter: Payer: Self-pay | Admitting: Internal Medicine

## 2012-04-05 ENCOUNTER — Encounter: Payer: Self-pay | Admitting: *Deleted

## 2012-04-05 VITALS — BP 140/78 | HR 58 | Wt 144.0 lb

## 2012-04-05 DIAGNOSIS — Z95810 Presence of automatic (implantable) cardiac defibrillator: Secondary | ICD-10-CM | POA: Diagnosis not present

## 2012-04-05 DIAGNOSIS — I509 Heart failure, unspecified: Secondary | ICD-10-CM

## 2012-04-05 DIAGNOSIS — I5022 Chronic systolic (congestive) heart failure: Secondary | ICD-10-CM | POA: Diagnosis not present

## 2012-04-05 NOTE — Patient Instructions (Addendum)
See instruction sheet for generator change  

## 2012-04-05 NOTE — Progress Notes (Signed)
HPI [name] returns today for followup. She has reached ERI on his device and has been stable. She denies chest pain or sob. No syncope. Her echo is notable for an improvement in her LV function. In addition, she has had no malignant ventricular arrhythmias since her device was placed. She has been bothered by some weight loss and poor appetite. Allergies  Allergen Reactions  . Cephalexin Swelling  . Doxycycline Other (See Comments)    Per pt: unknown  . Ofloxacin Other (See Comments)    Per pt: unknown     Current Outpatient Prescriptions  Medication Sig Dispense Refill  . Biotin 1000 MCG tablet Take 1,000 mcg by mouth. Take 4 tablets every day      . Calcium Carbonate-Vitamin D (CALCIUM + D PO) Take by mouth. Takes 1200mg of Calcium and 800mg Vit D every day      . Coenzyme Q10 (CO Q-10 PO) Take 100 mg by mouth as directed.      . Cyanocobalamin (VITAMIN B 12 PO) Take by mouth.      . digoxin (LANOXIN) 0.125 MG tablet Take 1 tablet (125 mcg total) by mouth daily.  30 tablet  5  . ESTRACE VAGINAL 0.1 MG/GM vaginal cream       . estrogen-methylTESTOSTERone (ESTRATEST HS) 0.625-1.25 MG per tablet Take 1 tablet by mouth daily.        . furosemide (LASIX) 20 MG tablet Take 1 tablet (20 mg total) by mouth daily. As needed  30 tablet  3  . levothyroxine (SYNTHROID, LEVOTHROID) 112 MCG tablet Take 112 mcg by mouth daily.        . Magnesium 250 MG TABS Take by mouth daily.      . metoprolol tartrate (LOPRESSOR) 25 MG tablet Take 12.5 mg by mouth daily.       . Multiple Vitamins-Minerals (CENTRUM SILVER PO) Take 1 tablet by mouth daily.        . potassium chloride SA (KLOR-CON M20) 20 MEQ tablet Take 1 tablet (20 mEq total) by mouth as needed (when taking Lasix).  30 tablet  3  . RABEprazole (ACIPHEX) 20 MG tablet Take 1 tablet (20 mg total) by mouth daily.  90 tablet  3  . trandolapril (MAVIK) 2 MG tablet Take 1 tablet (2 mg total) by mouth 2 (two) times daily.  180 tablet  3  . vitamin C  (ASCORBIC ACID) 500 MG tablet Take 1,000 mg by mouth daily.       . [DISCONTINUED] omeprazole (PRILOSEC) 20 MG capsule Take 1 capsule (20 mg total) by mouth daily.  30 capsule  11     Past Medical History  Diagnosis Date  . Nonischemic cardiomyopathy     EF initially 20%; Last measurement up to 50% per echo in August of 2012  . CHF (congestive heart failure)   . LBBB (left bundle branch block)   . Mitral regurgitation   . Melanoma   . Hypothyroidism   . Erosive gastritis 08/2004  . Hyperplastic polyps of stomach 06/2002  . Barrett's esophagus 04/2008  . GERD (gastroesophageal reflux disease)   . Diverticulitis   . Anxiety disorder   . Sleep apnea   . HTN (hypertension)   . ICD (implantable cardiac defibrillator) in place     BiV/ICD    ROS:   All systems reviewed and negative except as noted in the HPI.   Past Surgical History  Procedure Date  . Defibrillator insertion   . Arm surgery       left  . Abdominal hysterectomy   . Breast lumpectomy 1988    ? side per pt  . Appendectomy   . Tonsillectomy   . Right knee surgery      Family History  Problem Relation Age of Onset  . Heart disease Other     maternal side  . Colon cancer Paternal Uncle   . Breast cancer Maternal Aunt   . Diabetes Neg Hx      History   Social History  . Marital Status: Single    Spouse Name: N/A    Number of Children: 0  . Years of Education: N/A   Occupational History  . Retired   .     Social History Main Topics  . Smoking status: Former Smoker  . Smokeless tobacco: Never Used  . Alcohol Use: No  . Drug Use: No  . Sexually Active: No   Other Topics Concern  . Not on file   Social History Narrative  . No narrative on file     BP 140/78  Pulse 58  Wt 144 lb (65.318 kg)  Physical Exam:  Well appearing 79-year-old woman, NAD HEENT: Unremarkable Neck:  No JVD, no thyromegally Lungs:  Clear with no wheezes, rales, or rhonchi. HEART:  Regular rate rhythm, no  murmurs, no rubs, no clicks Abd:  soft, positive bowel sounds, no organomegally, no rebound, no guarding Ext:  2 plus pulses, no edema, no cyanosis, no clubbing Skin:  No rashes no nodules Neuro:  CN II through XII intact, motor grossly intact  DEVICE  Normal device function.  See PaceArt for details. Device at elective replacement  Assess/Plan:  

## 2012-04-05 NOTE — Assessment & Plan Note (Signed)
Repeat 2-D echo demonstrates near normalization of her left ventricular function. She will continue her current medical therapy.

## 2012-04-05 NOTE — Assessment & Plan Note (Signed)
Her device is at elective replacement. I discussed the treatment options and plan to remove her biventricular ICD and inserting a biventricular pacemaker. The risk, goals, benefits, of the procedure have been discussed and she wishes to proceed

## 2012-04-06 ENCOUNTER — Other Ambulatory Visit: Payer: Self-pay | Admitting: *Deleted

## 2012-04-06 DIAGNOSIS — Z45018 Encounter for adjustment and management of other part of cardiac pacemaker: Secondary | ICD-10-CM

## 2012-04-06 DIAGNOSIS — I509 Heart failure, unspecified: Secondary | ICD-10-CM

## 2012-04-10 ENCOUNTER — Encounter (HOSPITAL_COMMUNITY): Payer: Self-pay | Admitting: Pharmacy Technician

## 2012-04-10 DIAGNOSIS — H2640 Unspecified secondary cataract: Secondary | ICD-10-CM | POA: Diagnosis not present

## 2012-04-10 DIAGNOSIS — Z961 Presence of intraocular lens: Secondary | ICD-10-CM | POA: Diagnosis not present

## 2012-04-10 DIAGNOSIS — D3130 Benign neoplasm of unspecified choroid: Secondary | ICD-10-CM | POA: Diagnosis not present

## 2012-04-10 DIAGNOSIS — H04129 Dry eye syndrome of unspecified lacrimal gland: Secondary | ICD-10-CM | POA: Diagnosis not present

## 2012-04-12 ENCOUNTER — Other Ambulatory Visit (INDEPENDENT_AMBULATORY_CARE_PROVIDER_SITE_OTHER): Payer: Medicare Other

## 2012-04-12 DIAGNOSIS — I509 Heart failure, unspecified: Secondary | ICD-10-CM

## 2012-04-12 LAB — BASIC METABOLIC PANEL
Calcium: 8.8 mg/dL (ref 8.4–10.5)
GFR: 90.59 mL/min (ref >60.00)
Glucose, Bld: 75 mg/dL (ref 70–99)
Sodium: 134 mEq/L — ABNORMAL LOW (ref 135–145)

## 2012-04-12 LAB — CBC WITH DIFFERENTIAL/PLATELET
Basophils Absolute: 0 10*3/uL (ref 0.0–0.1)
Hemoglobin: 13.8 g/dL (ref 12.0–15.0)
Lymphocytes Relative: 21.4 % (ref 12.0–46.0)
Monocytes Relative: 12 % (ref 3.0–12.0)
Platelets: 213 10*3/uL (ref 150.0–400.0)
RDW: 12.9 % (ref 11.5–14.6)

## 2012-04-17 ENCOUNTER — Telehealth: Payer: Self-pay | Admitting: Cardiology

## 2012-04-17 NOTE — Telephone Encounter (Signed)
Spoke with pt, she is having a generator change with dr Ladona Ridgel on Wednesday this week. Due to the weather she is uncertain if she should reschedule or not. Her battery is low, that's why she is having the procedure. She would like to talk with dr taylor's nurse, will forward to [facility].

## 2012-04-17 NOTE — Telephone Encounter (Signed)
I have spoken with patient and let her know to keep things scheduled as they are for now.  If we need to reschedule we can move to the 19th.  Let her know to wait and see what the weather is like on Wed morning when she gets up

## 2012-04-17 NOTE — Telephone Encounter (Signed)
New problem    Surgery on  2/12. Please advise due to weather what are the options.

## 2012-04-18 ENCOUNTER — Telehealth: Payer: Self-pay | Admitting: Internal Medicine

## 2012-04-18 NOTE — Telephone Encounter (Signed)
Follow up call    hospital call this am  reschedule appointment to pending the  2/19.

## 2012-04-18 NOTE — Telephone Encounter (Signed)
Received a call from pre-service center @ [facility].  They called patient to remind her of her procedure tomorrow.  She is requesting the procedure to be cancelled due to predicted weather tomorrow and is requesting a call back to reschedule.   I called the cath lab and let them know and told them I would get a nurse to call the patient and reschedule a new time/date with her

## 2012-04-18 NOTE — Telephone Encounter (Signed)
Spoke with pt. She received call from someone at the hospital earlier today regarding procedure scheduled for tomorrow. Pt is not sure who she spoke with but states she told them she would like to cancel and reschedule to the 19th due to weather.  I told pt I would follow up and call her back.  I spoke with Clydie Braun in the cath lab and rescheduled procedure for 04/26/12 at 1:30. Pt to arrive at 11:30.  I called and spoke with pt and gave her this information.

## 2012-04-19 ENCOUNTER — Encounter (HOSPITAL_COMMUNITY): Admission: RE | Payer: Self-pay | Source: Ambulatory Visit

## 2012-04-19 ENCOUNTER — Ambulatory Visit (HOSPITAL_COMMUNITY): Admission: RE | Admit: 2012-04-19 | Payer: Medicare Other | Source: Ambulatory Visit | Admitting: Internal Medicine

## 2012-04-19 SURGERY — BI-VENTRICULAR PACEMAKER UPGRADE
Anesthesia: LOCAL

## 2012-04-21 ENCOUNTER — Encounter (HOSPITAL_COMMUNITY): Payer: Self-pay | Admitting: Pharmacy Technician

## 2012-04-25 DIAGNOSIS — I509 Heart failure, unspecified: Secondary | ICD-10-CM | POA: Diagnosis not present

## 2012-04-25 DIAGNOSIS — I428 Other cardiomyopathies: Secondary | ICD-10-CM | POA: Diagnosis not present

## 2012-04-25 DIAGNOSIS — I10 Essential (primary) hypertension: Secondary | ICD-10-CM | POA: Diagnosis not present

## 2012-04-25 DIAGNOSIS — I447 Left bundle-branch block, unspecified: Secondary | ICD-10-CM | POA: Diagnosis not present

## 2012-04-25 DIAGNOSIS — Z4502 Encounter for adjustment and management of automatic implantable cardiac defibrillator: Secondary | ICD-10-CM | POA: Diagnosis not present

## 2012-04-25 MED ORDER — SODIUM CHLORIDE 0.9 % IV SOLN: 250.0000 mL | INTRAVENOUS | Status: DC

## 2012-04-25 MED ORDER — SODIUM CHLORIDE 0.9 % IJ SOLN: 3.0000 mL | INTRAMUSCULAR | Status: DC | PRN

## 2012-04-25 MED ORDER — VANCOMYCIN HCL IN DEXTROSE 1-5 GM/200ML-% IV SOLN
1000.0000 mg | Freq: Once | INTRAVENOUS | Status: DC
  Filled 2012-04-25 (×2): qty 200

## 2012-04-25 MED ORDER — CEFAZOLIN SODIUM-DEXTROSE 2-3 GM-% IV SOLR
2.0000 g | INTRAVENOUS | Status: DC
  Filled 2012-04-25: qty 50

## 2012-04-25 MED ORDER — SODIUM CHLORIDE 0.9 % IR SOLN
80.0000 mg | Status: DC
  Filled 2012-04-25: qty 2

## 2012-04-25 MED ORDER — SODIUM CHLORIDE 0.45 % IV SOLN
INTRAVENOUS | Status: DC
  Administered 2012-04-26: 12:00:00 via INTRAVENOUS

## 2012-04-25 MED ORDER — SODIUM CHLORIDE 0.9 % IJ SOLN: 3.0000 mL | Freq: Two times a day (BID) | INTRAMUSCULAR | Status: DC

## 2012-04-25 MED ORDER — CHLORHEXIDINE GLUCONATE 4 % EX LIQD
60.0000 mL | Freq: Once | CUTANEOUS | Status: DC
  Filled 2012-04-25: qty 60

## 2012-04-26 ENCOUNTER — Encounter (HOSPITAL_COMMUNITY)
Admission: RE | Disposition: A | Discharge status: Home / Self Care | Payer: Self-pay | Source: Ambulatory Visit | Attending: Internal Medicine

## 2012-04-26 ENCOUNTER — Ambulatory Visit (HOSPITAL_COMMUNITY)
Admission: RE | Admit: 2012-04-26 | Discharge: 2012-04-26 | Disposition: A | Discharge status: Home / Self Care | Payer: Medicare Other | Source: Ambulatory Visit | Attending: Internal Medicine | Admitting: Internal Medicine

## 2012-04-26 DIAGNOSIS — I447 Left bundle-branch block, unspecified: Secondary | ICD-10-CM

## 2012-04-26 DIAGNOSIS — Z45018 Encounter for adjustment and management of other part of cardiac pacemaker: Secondary | ICD-10-CM | POA: Diagnosis not present

## 2012-04-26 DIAGNOSIS — Z950 Presence of cardiac pacemaker: Secondary | ICD-10-CM

## 2012-04-26 DIAGNOSIS — I10 Essential (primary) hypertension: Secondary | ICD-10-CM | POA: Insufficient documentation

## 2012-04-26 DIAGNOSIS — Z4502 Encounter for adjustment and management of automatic implantable cardiac defibrillator: Secondary | ICD-10-CM | POA: Insufficient documentation

## 2012-04-26 DIAGNOSIS — I509 Heart failure, unspecified: Secondary | ICD-10-CM

## 2012-04-26 DIAGNOSIS — I428 Other cardiomyopathies: Secondary | ICD-10-CM | POA: Insufficient documentation

## 2012-04-26 HISTORY — PX: BI-VENTRICULAR PACEMAKER INSERTION: SHX5462

## 2012-04-26 HISTORY — DX: Presence of cardiac pacemaker: Z95.0

## 2012-04-26 LAB — SURGICAL PCR SCREEN
MRSA, PCR: NEGATIVE
Staphylococcus aureus: NEGATIVE

## 2012-04-26 SURGERY — BI-VENTRICULAR PACEMAKER INSERTION (CRT-P)
Anesthesia: LOCAL

## 2012-04-26 MED ORDER — MUPIROCIN 2 % EX OINT
TOPICAL_OINTMENT | CUTANEOUS | Status: AC
  Administered 2012-04-26: 1 via NASAL
  Filled 2012-04-26: qty 22

## 2012-04-26 MED ORDER — FENTANYL CITRATE 0.05 MG/ML IJ SOLN
INTRAMUSCULAR | Status: AC
  Filled 2012-04-26: qty 2

## 2012-04-26 MED ORDER — MUPIROCIN 2 % EX OINT
TOPICAL_OINTMENT | Freq: Two times a day (BID) | CUTANEOUS | Status: DC
  Administered 2012-04-26: 1 via NASAL

## 2012-04-26 MED ORDER — ACETAMINOPHEN 325 MG PO TABS: 325.0000 mg | ORAL_TABLET | ORAL | Status: DC | PRN

## 2012-04-26 MED ORDER — ONDANSETRON HCL 4 MG/2ML IJ SOLN: 4.0000 mg | Freq: Four times a day (QID) | INTRAMUSCULAR | Status: DC | PRN

## 2012-04-26 MED ORDER — MIDAZOLAM HCL 5 MG/5ML IJ SOLN
INTRAMUSCULAR | Status: AC
  Filled 2012-04-26: qty 5

## 2012-04-26 MED ORDER — LIDOCAINE HCL (PF) 1 % IJ SOLN
INTRAMUSCULAR | Status: AC
  Filled 2012-04-26: qty 60

## 2012-04-26 NOTE — Progress Notes (Signed)
12 Lead EKG shown to Dr. Ladona Ridgel, no new orders received. Informed cath lab that pt has only one PIV in right arm d/t having lymph nodes removed from left axillary.

## 2012-04-26 NOTE — H&P (View-Only) (Signed)
HPI [name] returns today for followup. She has reached ERI on his device and has been stable. She denies chest pain or sob. No syncope. Her echo is notable for an improvement in her LV function. In addition, she has had no malignant ventricular arrhythmias since her device was placed. She has been bothered by some weight loss and poor appetite. Allergies  Allergen Reactions  . Cephalexin Swelling  . Doxycycline Other (See Comments)    Per pt: unknown  . Ofloxacin Other (See Comments)    Per pt: unknown     Current Outpatient Prescriptions  Medication Sig Dispense Refill  . Biotin 1000 MCG tablet Take 1,000 mcg by mouth. Take 4 tablets every day      . Calcium Carbonate-Vitamin D (CALCIUM + D PO) Take by mouth. Takes 1200mg  of Calcium and 800mg  Vit D every day      . Coenzyme Q10 (CO Q-10 PO) Take 100 mg by mouth as directed.      . Cyanocobalamin (VITAMIN B 12 PO) Take by mouth.      . digoxin (LANOXIN) 0.125 MG tablet Take 1 tablet (125 mcg total) by mouth daily.  30 tablet  5  . ESTRACE VAGINAL 0.1 MG/GM vaginal cream       . estrogen-methylTESTOSTERone (ESTRATEST HS) 0.625-1.25 MG per tablet Take 1 tablet by mouth daily.        . furosemide (LASIX) 20 MG tablet Take 1 tablet (20 mg total) by mouth daily. As needed  30 tablet  3  . levothyroxine (SYNTHROID, LEVOTHROID) 112 MCG tablet Take 112 mcg by mouth daily.        . Magnesium 250 MG TABS Take by mouth daily.      . metoprolol tartrate (LOPRESSOR) 25 MG tablet Take 12.5 mg by mouth daily.       . Multiple Vitamins-Minerals (CENTRUM SILVER PO) Take 1 tablet by mouth daily.        . potassium chloride SA (KLOR-CON M20) 20 MEQ tablet Take 1 tablet (20 mEq total) by mouth as needed (when taking Lasix).  30 tablet  3  . RABEprazole (ACIPHEX) 20 MG tablet Take 1 tablet (20 mg total) by mouth daily.  90 tablet  3  . trandolapril (MAVIK) 2 MG tablet Take 1 tablet (2 mg total) by mouth 2 (two) times daily.  180 tablet  3  . vitamin C  (ASCORBIC ACID) 500 MG tablet Take 1,000 mg by mouth daily.       . [DISCONTINUED] omeprazole (PRILOSEC) 20 MG capsule Take 1 capsule (20 mg total) by mouth daily.  30 capsule  11     Past Medical History  Diagnosis Date  . Nonischemic cardiomyopathy     EF initially 20%; Last measurement up to 50% per echo in August of 2012  . CHF (congestive heart failure)   . LBBB (left bundle branch block)   . Mitral regurgitation   . Melanoma   . Hypothyroidism   . Erosive gastritis 08/2004  . Hyperplastic polyps of stomach 06/2002  . Barrett's esophagus 04/2008  . GERD (gastroesophageal reflux disease)   . Diverticulitis   . Anxiety disorder   . Sleep apnea   . HTN (hypertension)   . ICD (implantable cardiac defibrillator) in place     BiV/ICD    ROS:   All systems reviewed and negative except as noted in the HPI.   Past Surgical History  Procedure Date  . Defibrillator insertion   . Arm surgery  left  . Abdominal hysterectomy   . Breast lumpectomy 1988    ? side per pt  . Appendectomy   . Tonsillectomy   . Right knee surgery      Family History  Problem Relation Age of Onset  . Heart disease Other     maternal side  . Colon cancer Paternal Uncle   . Breast cancer Maternal Aunt   . Diabetes Neg Hx      History   Social History  . Marital Status: Single    Spouse Name: N/A    Number of Children: 0  . Years of Education: N/A   Occupational History  . Retired   .     Social History Main Topics  . Smoking status: Former Games developer  . Smokeless tobacco: Never Used  . Alcohol Use: No  . Drug Use: No  . Sexually Active: No   Other Topics Concern  . Not on file   Social History Narrative  . No narrative on file     BP 140/78  Pulse 58  Wt 144 lb (65.318 kg)  Physical Exam:  Well appearing 79 year old woman, NAD HEENT: Unremarkable Neck:  No JVD, no thyromegally Lungs:  Clear with no wheezes, rales, or rhonchi. HEART:  Regular rate rhythm, no  murmurs, no rubs, no clicks Abd:  soft, positive bowel sounds, no organomegally, no rebound, no guarding Ext:  2 plus pulses, no edema, no cyanosis, no clubbing Skin:  No rashes no nodules Neuro:  CN II through XII intact, motor grossly intact  DEVICE  Normal device function.  See PaceArt for details. Device at elective replacement  Assess/Plan:

## 2012-04-26 NOTE — Interval H&P Note (Signed)
History and Physical Interval Note:  04/26/2012 1:57 PM  [name]  has presented today for surgery, with the diagnosis of hf  The various methods of treatment have been discussed with the patient and family. After consideration of risks, benefits and other options for treatment, the patient has consented to  Procedure(s): BI-VENTRICULAR PACEMAKER INSERTION (CRT-P) (N/A) as a surgical intervention .  The patient's history has been reviewed, patient examined, no change in status, stable for surgery.  I have reviewed the patient's chart and labs.  Questions were answered to the patient's satisfaction.     [name]

## 2012-04-26 NOTE — Op Note (Signed)
BiV ICD removal and insertion of a new BiV PPM without immediate complications. [id number].

## 2012-04-27 ENCOUNTER — Telehealth: Payer: Self-pay | Admitting: Internal Medicine

## 2012-04-27 NOTE — Telephone Encounter (Signed)
Spoke with patient and let her know that the blood on bandage she removed is normal.  To call us if she had any new blood

## 2012-04-27 NOTE — Op Note (Signed)
[name], [name]               ACCOUNT NO.:  [id number]  MEDICAL RECORD NO.:  [id number]  LOCATION:  [facility]                         FACILITY:  [facility]  PHYSICIAN:  Doylene Canning. Ladona Ridgel, MD    DATE OF BIRTH:  1932/10/24  DATE OF PROCEDURE:  04/26/2012 DATE OF DISCHARGE:  04/26/2012                              OPERATIVE REPORT   PROCEDURE PERFORMED:  Removal of a previously implanted biventricular ICD and insertion of a new biventricular pacemaker.  INTRODUCTION:  The patient is a 79 year old woman with nonischemic cardiomyopathy, chronic left bundle-branch block, and congestive heart failure.  With biventricular ICD insertion, the patient's heart failure improved and her ejection fraction increased to 45-50% from prior 20%. She has reached elective replacement on her ICD and is now referred for removal of her ICD and insertion of a new biventricular pacemaker.  Of note, the patient has had no ventricular arrhythmias.  DESCRIPTION OF PROCEDURE:  After informed consent was obtained, the patient was taken to the diagnostic EP lab in a fasting state.  After usual preparation and draping, intravenous fentanyl and midazolam was given for sedation.  Lidocaine 30 mL was infiltrated into the left infraclavicular region.  A 7 cm incision was carried out over this region and electrocautery was utilized to dissect down to the fascial plane.  The ICD pocket was entered with electrocautery and the device was removed with gentle traction.  The leads were disconnected from the device.  They were evaluated and found to be working satisfactorily. The new [employer] biventricular pacemaker, serial number [id number] was connected to the atrial RV and LV rate sense leads.  The pocket was irrigated with antibiotic irrigation.  The shocking coils were capped.  The device was placed back in the subcutaneous pocket. The pocket was irrigated with additional antibiotic irrigation and the incision was  closed with 2-0 and 3-0 Vicryl.  Benzoin, Steri-Strips were painted on the skin.  A pressure dressing was applied and the patient was returned to her room in satisfactory condition.  COMPLICATIONS:  There were no immediate procedure complications.  RESULTS:  This demonstrates successful implantation of a [employer] pacemaker and removal of a previously implanted [employer] biventricular ICD which had reached elective replacement.     Doylene Canning. Ladona Ridgel, MD     GWT/MEDQ  D:  04/26/2012  T:  04/27/2012  Job:  [id number]

## 2012-04-27 NOTE — Telephone Encounter (Signed)
New Problem   Pt has question regarding procedure done yesterday (pacemaker put in).

## 2012-05-02 ENCOUNTER — Encounter: Payer: Self-pay | Admitting: Cardiology

## 2012-05-03 NOTE — Telephone Encounter (Signed)
Ok to shower before coming to the appointment tomorrow

## 2012-05-03 NOTE — Telephone Encounter (Signed)
New problem   Pt had defib taken out and pacemaker put in by Dr Ladona Ridgel and wanted to know if she could take a shower and get her bandages wet.

## 2012-05-04 ENCOUNTER — Ambulatory Visit (INDEPENDENT_AMBULATORY_CARE_PROVIDER_SITE_OTHER): Payer: Medicare Other | Admitting: *Deleted

## 2012-05-04 ENCOUNTER — Encounter: Payer: Self-pay | Admitting: Internal Medicine

## 2012-05-04 ENCOUNTER — Ambulatory Visit: Payer: Medicare Other

## 2012-05-04 DIAGNOSIS — I442 Atrioventricular block, complete: Secondary | ICD-10-CM | POA: Diagnosis not present

## 2012-05-04 DIAGNOSIS — I5022 Chronic systolic (congestive) heart failure: Secondary | ICD-10-CM

## 2012-05-04 LAB — PACEMAKER DEVICE OBSERVATION
AL THRESHOLD: 0.6 V
ATRIAL PACING PM: 76
LV LEAD IMPEDENCE PM: 993 Ohm
RV LEAD THRESHOLD: 1.3 V
VENTRICULAR PACING PM: 91

## 2012-05-04 NOTE — Progress Notes (Signed)
Wound check-PPM 

## 2012-05-10 ENCOUNTER — Telehealth: Payer: Self-pay | Admitting: Internal Medicine

## 2012-05-10 NOTE — Telephone Encounter (Signed)
Pt wants to know how long she should wait prior to having a mammogram? pls call

## 2012-05-10 NOTE — Telephone Encounter (Signed)
4 weeks after the change out

## 2012-05-25 DIAGNOSIS — D3130 Benign neoplasm of unspecified choroid: Secondary | ICD-10-CM | POA: Diagnosis not present

## 2012-06-16 ENCOUNTER — Encounter: Payer: Self-pay | Admitting: Gastroenterology

## 2012-06-20 ENCOUNTER — Other Ambulatory Visit: Payer: Self-pay | Admitting: Nurse Practitioner

## 2012-06-28 ENCOUNTER — Encounter: Payer: Self-pay | Admitting: Internal Medicine

## 2012-06-28 ENCOUNTER — Ambulatory Visit (INDEPENDENT_AMBULATORY_CARE_PROVIDER_SITE_OTHER): Payer: Medicare Other | Admitting: Internal Medicine

## 2012-06-28 VITALS — BP 138/62 | HR 57 | Temp 97.7°F | Resp 14 | Ht 69.0 in | Wt 141.0 lb

## 2012-06-28 DIAGNOSIS — E039 Hypothyroidism, unspecified: Secondary | ICD-10-CM

## 2012-06-28 DIAGNOSIS — J329 Chronic sinusitis, unspecified: Secondary | ICD-10-CM

## 2012-06-28 DIAGNOSIS — J42 Unspecified chronic bronchitis: Secondary | ICD-10-CM

## 2012-06-28 DIAGNOSIS — F411 Generalized anxiety disorder: Secondary | ICD-10-CM | POA: Diagnosis not present

## 2012-06-28 DIAGNOSIS — L659 Nonscarring hair loss, unspecified: Secondary | ICD-10-CM | POA: Diagnosis not present

## 2012-06-28 DIAGNOSIS — I428 Other cardiomyopathies: Secondary | ICD-10-CM | POA: Diagnosis not present

## 2012-06-28 DIAGNOSIS — F419 Anxiety disorder, unspecified: Secondary | ICD-10-CM

## 2012-06-28 DIAGNOSIS — J449 Chronic obstructive pulmonary disease, unspecified: Secondary | ICD-10-CM

## 2012-06-28 DIAGNOSIS — F3289 Other specified depressive episodes: Secondary | ICD-10-CM

## 2012-06-28 DIAGNOSIS — G2581 Restless legs syndrome: Secondary | ICD-10-CM

## 2012-06-28 DIAGNOSIS — F329 Major depressive disorder, single episode, unspecified: Secondary | ICD-10-CM

## 2012-06-28 DIAGNOSIS — J4489 Other specified chronic obstructive pulmonary disease: Secondary | ICD-10-CM

## 2012-06-28 NOTE — Progress Notes (Signed)
Subjective:    Patient ID: [name], female    DOB: [birth year], 79 y.o.   MRN: [id number]  HPI Multiple complaints. Hands are cold and tingling. Pacer replacement 04/26/12 in left chest. Right arm is sensitive and stinging after the replacement. Discomfort went from the arm to her chest to her hip. She feels like her right side is "falling apart". She haas pain that runs from her hip to her right foot. It is worse in the morning. It improves with moving around. She has a pain in the right side of head that goes into her neck. She is losing hair. "No eyebrows". Getting worse per patient. Remains depressed. Worries about her boyfriend and recent increase in feebleness.  She has a freckle in her eye. She is to see Dr. Champ Mungo, ophth. Pollen is causing her to have puffy lips, increased tearing, and increased dyspnea  Hypothyroidism:stable  Hair thinning: diffuse  Anxiety disorder: chronic  Other primary cardiomyopathies: improved since originally diagnosed  Unspecified sinusitis (chronic) and rhinitis: persistent nasal drainage.  Unspecified chronic bronchitis: no fever. Persistent cough.  Depressive disorder, not elsewhere classified: she does not want to take medication  Restless legs syndrome (RLS): improves if she drinks tonic water  Chronic airway obstruction, not elsewhere classified: dyspnea with exertion  Current Outpatient Prescriptions on File Prior to Visit  Medication Sig Dispense Refill  . acetaminophen (TYLENOL) 500 MG tablet Take 500 mg by mouth every 6 (six) hours as needed for pain. For pain      . Ascorbic Acid (VITAMIN C) 1000 MG tablet Take 1,000 mg by mouth daily.      . Biotin 1000 MCG tablet Take 5,000 mcg by mouth daily.       . Calcium Carbonate-Vitamin D (CALCIUM + D PO) Take 1 tablet by mouth daily. 1200mg  of Calcium and 1000mg  Vit D      . Coenzyme Q10 (CO Q-10 PO) Take 100 mg by mouth daily. Hold while in hospital      . Cyanocobalamin  (VITAMIN B 12 PO) Take 1 tablet by mouth daily.       . digoxin (LANOXIN) 0.125 MG tablet Take 1 tablet (125 mcg total) by mouth daily.  30 tablet  5  . ESTRACE VAGINAL 0.1 MG/GM vaginal cream Place 1 g vaginally 2 (two) times a week.       . estrogen-methylTESTOSTERone (ESTRATEST HS) 0.625-1.25 MG per tablet Take 1 tablet by mouth daily.       . furosemide (LASIX) 20 MG tablet Take 20 mg by mouth daily as needed. For fluid retention      . levothyroxine (SYNTHROID, LEVOTHROID) 112 MCG tablet Take 112 mcg by mouth daily.        . Magnesium 250 MG TABS Take 1 tablet by mouth daily.       . Multiple Vitamin (MULTIVITAMIN WITH MINERALS) TABS Take 1 tablet by mouth daily.      Marland Kitchen OVER THE COUNTER MEDICATION Place 1 drop into both eyes daily as needed. OTC Dry Eyes Eyedrop  For dry eyes      . potassium chloride SA (K-DUR,KLOR-CON) 20 MEQ tablet Take 20 mEq by mouth daily as needed. When taking the lasix      . [DISCONTINUED] omeprazole (PRILOSEC) 20 MG capsule Take 1 capsule (20 mg total) by mouth daily.  30 capsule  11         Review of Systems  Constitutional: Positive for appetite change and fatigue. Negative for fever, chills  and unexpected weight change.  HENT: Positive for hearing loss, congestion, rhinorrhea, neck pain, neck stiffness, postnasal drip and sinus pressure. Negative for ear pain, nosebleeds, facial swelling, sneezing, tinnitus and ear discharge.   Eyes: Negative.   Respiratory: Positive for cough and shortness of breath. Negative for wheezing.   Cardiovascular: Positive for palpitations. Negative for chest pain and leg swelling.  Gastrointestinal: Negative for nausea, abdominal pain, diarrhea, blood in stool and abdominal distention.       Hx erosive gastritis, HH, Barrett's esophagus.  Endocrine: Negative.   Genitourinary:       Nocturia x 3. Vaginal discomfort. Uses hormone cream from GYN, Dr. Jennette Kettle  Musculoskeletal: Positive for back pain and arthralgias. Negative for  myalgias, joint swelling and gait problem.  Skin: Negative for pallor, rash and wound.  Neurological: Positive for dizziness, numbness and headaches. Negative for tremors, seizures, syncope and speech difficulty.  Hematological: Negative.   Psychiatric/Behavioral: Positive for dysphoric mood. The patient is nervous/anxious.        Objective:BP 138/62  Pulse 57  Temp(Src) 97.7 F (36.5 C) (Oral)  Resp 14  Ht 5\' 9"  (1.753 m)  Wt 141 lb (63.957 kg)  BMI 20.81 kg/m2  SpO2 99%    Physical Exam  Constitutional:  Frail, thin body  HENT:  Right Ear: External ear normal.  Left Ear: External ear normal.  Mouth/Throat: Oropharynx is clear and moist.  Nasal polyp  Neck: No JVD present. No tracheal deviation present. No thyromegaly present.  stiff  Cardiovascular: Normal rate and regular rhythm.  Exam reveals no gallop and no friction rub.   No murmur heard. AICD in left upper chest. Raynaud's disease.  Pulmonary/Chest: Effort normal. No respiratory distress. She has no wheezes. She has rales. She exhibits no tenderness.  Abdominal: Soft. Bowel sounds are normal. She exhibits no distension and no mass. There is no tenderness.  Right inguinal hernia.  Musculoskeletal: Normal range of motion. She exhibits no edema.  Tender right knee.   Lymphadenopathy:    She has no cervical adenopathy.  Neurological:  Reduced vibratory sensation in both feet.  Psychiatric:  Anxious. Depressed affect. Low energy.       Assessment & Plan:  Hypothyroidism: continue current dose levothyroxine  Hair thinning: diffuse. Advised her I think this is stress related and age related  Anxiety disorder: unchanged  Other primary cardiomyopathies: stable and improved since originally diagnosed  Unspecified sinusitis (chronic): no change is meds  Unspecified chronic bronchitis: stable  Depressive disorder, not elsewhere classified: she does not want to take any medication for this.   Restless legs  syndrome (RLS); continue with tonic water  Chronic airway obstruction, not elsewhere classified: stable

## 2012-06-28 NOTE — Patient Instructions (Signed)
Continue current medication.

## 2012-07-07 ENCOUNTER — Other Ambulatory Visit: Payer: Self-pay | Admitting: Gastroenterology

## 2012-07-07 NOTE — Telephone Encounter (Signed)
NEEDS OFFICE VISIT FOR ANY FURTHER REFILLS! 

## 2012-07-13 ENCOUNTER — Telehealth: Payer: Self-pay | Admitting: Gastroenterology

## 2012-07-13 MED ORDER — RABEPRAZOLE SODIUM 20 MG PO TBEC: 20.0000 mg | DELAYED_RELEASE_TABLET | Freq: Every day | ORAL | Status: DC

## 2012-07-13 NOTE — Telephone Encounter (Signed)
Called patient and line was busy.  

## 2012-07-13 NOTE — Telephone Encounter (Signed)
Patient states she needs the prescription sent for Aciphex to say 90 day supply  instead of 30. She states she has to pay the same amount for 30 as she does for 90.

## 2012-07-13 NOTE — Telephone Encounter (Signed)
Called patient again and told her she needs to be seen in the office soon for a follow up visit before we can give her years worth of refills but will give her one 90 day supply until she schedules an office visit. Patient states she is due for a Colonoscopy anyway so she will call back in next couple of weeks to schedule that directly. Prescription sent to [facility].

## 2012-07-17 DIAGNOSIS — R350 Frequency of micturition: Secondary | ICD-10-CM | POA: Diagnosis not present

## 2012-07-17 DIAGNOSIS — B373 Candidiasis of vulva and vagina: Secondary | ICD-10-CM | POA: Diagnosis not present

## 2012-07-18 DIAGNOSIS — Z1231 Encounter for screening mammogram for malignant neoplasm of breast: Secondary | ICD-10-CM | POA: Diagnosis not present

## 2012-08-01 ENCOUNTER — Encounter: Payer: Self-pay | Admitting: Internal Medicine

## 2012-08-01 ENCOUNTER — Encounter: Payer: Medicare Other | Admitting: Internal Medicine

## 2012-08-01 ENCOUNTER — Ambulatory Visit (INDEPENDENT_AMBULATORY_CARE_PROVIDER_SITE_OTHER): Payer: Medicare Other | Admitting: Internal Medicine

## 2012-08-01 VITALS — BP 127/63 | HR 55 | Ht 68.5 in | Wt 141.8 lb

## 2012-08-01 DIAGNOSIS — I5022 Chronic systolic (congestive) heart failure: Secondary | ICD-10-CM

## 2012-08-01 DIAGNOSIS — Z95810 Presence of automatic (implantable) cardiac defibrillator: Secondary | ICD-10-CM | POA: Diagnosis not present

## 2012-08-01 LAB — PACEMAKER DEVICE OBSERVATION
AL THRESHOLD: 0.6 V
DEVICE MODEL PM: 100543
LV LEAD THRESHOLD: 0.9 V
RV LEAD AMPLITUDE: 16.4 mv

## 2012-08-01 NOTE — Assessment & Plan Note (Signed)
Her chronic systolic heart failure remains class II.no change in medical therapy. I've encouraged the patient to increase her physical activity.

## 2012-08-01 NOTE — Assessment & Plan Note (Signed)
Her [employer] biventricular pacemaker is working normally. We'll plan to recheck in several months. 

## 2012-08-01 NOTE — Patient Instructions (Addendum)
Your physician wants you to follow-up in: 04/2013 with Dr Court Joy will receive a reminder letter in the mail two months in advance. If you don't receive a letter, please call our office to schedule the follow-up appointment.

## 2012-08-01 NOTE — Progress Notes (Signed)
HPI [name] returns today for followup. She is a very pleasant 79 year old woman with a nonischemic cardiomyopathy, chronic systolic heart failure, left bundle branch block, status post biventricular pacemaker insertion. The patient recently had her ICD removed and a biventricular pacemaker placed without complication. In the interim, she complains of feeling bored but denies chest pain, shortness of breath, or peripheral edema. No syncope.  Allergies  Allergen Reactions  . Cephalexin Itching and Swelling  . Doxycycline Other (See Comments)    Per pt: unknown  . Ofloxacin Other (See Comments)    Per pt: unknown  . Tape Other (See Comments)    Burns skin.      Current Outpatient Prescriptions  Medication Sig Dispense Refill  . acetaminophen (TYLENOL) 500 MG tablet Take 500 mg by mouth every 6 (six) hours as needed for pain. For pain      . Ascorbic Acid (VITAMIN C) 1000 MG tablet Take 1,000 mg by mouth daily.      . Biotin 1000 MCG tablet Take 5,000 mcg by mouth daily.       . Calcium Carbonate-Vitamin D (CALCIUM + D PO) Take 1 tablet by mouth daily. 1200mg  of Calcium and 800mg  Vit D      . Coenzyme Q10 (CO Q-10 PO) Take 100 mg by mouth daily. Hold while in hospital      . Cyanocobalamin (VITAMIN B 12 PO) Take 1 tablet by mouth daily.       . digoxin (LANOXIN) 0.125 MG tablet Take 1 tablet (125 mcg total) by mouth daily.  30 tablet  5  . ESTRACE VAGINAL 0.1 MG/GM vaginal cream Place 1 g vaginally 2 (two) times a week.       . estrogen-methylTESTOSTERone (ESTRATEST HS) 0.625-1.25 MG per tablet Take 1 tablet by mouth daily.       . furosemide (LASIX) 20 MG tablet Take 20 mg by mouth daily as needed. For fluid retention      . levothyroxine (SYNTHROID, LEVOTHROID) 112 MCG tablet Take 112 mcg by mouth daily.        . Magnesium 250 MG TABS Take 1 tablet by mouth daily.       . metoprolol tartrate (LOPRESSOR) 25 MG tablet       . Multiple Vitamin (MULTIVITAMIN WITH MINERALS) TABS Take 1  tablet by mouth daily.      Marland Kitchen OVER THE COUNTER MEDICATION Place 1 drop into both eyes daily as needed. OTC Dry Eyes Eyedrop  For dry eyes      . potassium chloride SA (K-DUR,KLOR-CON) 20 MEQ tablet Take 20 mEq by mouth daily as needed. When taking the lasix      . RABEprazole (ACIPHEX) 20 MG tablet Take 1 tablet (20 mg total) by mouth daily.  90 tablet  0  . trandolapril (MAVIK) 2 MG tablet Take 1 tablet (2 mg total) by mouth 2 (two) times daily.  180 tablet  3  . [DISCONTINUED] omeprazole (PRILOSEC) 20 MG capsule Take 1 capsule (20 mg total) by mouth daily.  30 capsule  11   No current facility-administered medications for this visit.     Past Medical History  Diagnosis Date  . Nonischemic cardiomyopathy     EF initially 20%; Last measurement up to 50% per echo in August of 2012  . CHF (congestive heart failure)   . LBBB (left bundle branch block)   . Mitral regurgitation   . Melanoma   . Hypothyroidism   . Erosive gastritis 08/2004  .  Hyperplastic polyps of stomach 06/2002  . Barrett's esophagus 04/2008  . GERD (gastroesophageal reflux disease)   . Diverticulitis   . Anxiety disorder   . Sleep apnea   . HTN (hypertension)   . ICD (implantable cardiac defibrillator) in place     BiV/ICD    ROS:   All systems reviewed and negative except as noted in the HPI.   Past Surgical History  Procedure Laterality Date  . Defibrillator insertion    . Arm surgery      left  . Abdominal hysterectomy    . Breast lumpectomy  1988    ? side per pt  . Appendectomy    . Tonsillectomy    . Right knee surgery       Family History  Problem Relation Age of Onset  . Heart disease Other     maternal side  . Colon cancer Paternal Uncle   . Breast cancer Maternal Aunt   . Diabetes Neg Hx      History   Social History  . Marital Status: Single    Spouse Name: N/A    Number of Children: 0  . Years of Education: N/A   Occupational History  . Retired   .     Social History  Main Topics  . Smoking status: Former Games developer  . Smokeless tobacco: Never Used  . Alcohol Use: No  . Drug Use: No  . Sexually Active: No   Other Topics Concern  . Not on file   Social History Narrative  . No narrative on file     BP 127/63  Pulse 55  Ht 5' 8.5" (1.74 m)  Wt 141 lb 12.8 oz (64.32 kg)  BMI 21.24 kg/m2  Physical Exam:  Well appearing 79 year old woman, NAD HEENT: Unremarkable Neck:  No JVD, no thyromegally Lungs:  Clear with no wheezes, rales, or rhonchi. Well-healed pacemaker incision HEART:  Regular rate rhythm, no murmurs, no rubs, no clicks Abd:  soft, positive bowel sounds, no organomegally, no rebound, no guarding Ext:  2 plus pulses, no edema, no cyanosis, no clubbing Skin:  No rashes no nodules Neuro:  CN II through XII intact, motor grossly intact  ECG - normal sinus rhythm with AV sequential biventricular pacing  DEVICE  Normal device function.  See PaceArt for details.   Assess/Plan:

## 2012-08-02 ENCOUNTER — Other Ambulatory Visit (INDEPENDENT_AMBULATORY_CARE_PROVIDER_SITE_OTHER): Payer: Self-pay | Admitting: General Surgery

## 2012-08-02 ENCOUNTER — Encounter (INDEPENDENT_AMBULATORY_CARE_PROVIDER_SITE_OTHER): Payer: Self-pay | Admitting: General Surgery

## 2012-08-02 ENCOUNTER — Ambulatory Visit (INDEPENDENT_AMBULATORY_CARE_PROVIDER_SITE_OTHER): Payer: Medicare Other | Admitting: General Surgery

## 2012-08-02 VITALS — BP 126/74 | HR 60 | Temp 98.0°F | Resp 18 | Ht 68.5 in | Wt 140.0 lb

## 2012-08-02 DIAGNOSIS — K4090 Unilateral inguinal hernia, without obstruction or gangrene, not specified as recurrent: Secondary | ICD-10-CM

## 2012-08-02 NOTE — Patient Instructions (Signed)
Call [phone] when you are ready to schedule your hernia surgery.  Ask for a surgery scheduler.

## 2012-08-02 NOTE — Progress Notes (Signed)
Patient ID: [name], female   DOB: 07/23/1932, 79 y.o.   MRN: [id number]  Chief Complaint  Patient presents with  . New Evaluation    hernia groin    HPI [name] is a 79 y.o. female.   HPI  She is referred by Dr. Jennette Kettle for evaluation of a right inguinal hernia.  She noticed a small bulge in the right groin about a year ago. It has become larger and somewhat uncomfortable at times. She notices a small little area in the left lateral groin as well but has not changed. No difficulty with urination or constipation. No chronic cough.  Past Medical History  Diagnosis Date  . Nonischemic cardiomyopathy     EF initially 20%; Last measurement up to 50% per echo in August of 2012  . CHF (congestive heart failure)   . LBBB (left bundle branch block)   . Mitral regurgitation   . Melanoma   . Hypothyroidism   . Erosive gastritis 08/2004  . Hyperplastic polyps of stomach 06/2002  . Barrett's esophagus 04/2008  . GERD (gastroesophageal reflux disease)   . Diverticulitis   . Anxiety disorder   . Sleep apnea   . HTN (hypertension)   . ICD (implantable cardiac defibrillator) in place     BiV/ICD    Raynaud's disease  Past Surgical History  Procedure Laterality Date  . Defibrillator insertion    . Arm surgery      left  . Abdominal hysterectomy    . Breast lumpectomy  1988    ? side per pt  . Appendectomy    . Tonsillectomy    . Right knee surgery        Family History  Problem Relation Age of Onset  . Heart disease Other     maternal side  . Colon cancer Paternal Uncle   . Breast cancer Maternal Aunt   . Diabetes Neg Hx   . Stroke Father     Social History History  Substance Use Topics  . Smoking status: Former Games developer  . Smokeless tobacco: Never Used  . Alcohol Use: No    Allergies  Allergen Reactions  . Cephalexin Itching and Swelling  . Doxycycline Other (See Comments)    Per pt: unknown  . Ofloxacin Other (See Comments)    Per pt: unknown  . Tape Other  (See Comments)    Burns skin.     Current Outpatient Prescriptions  Medication Sig Dispense Refill  . acetaminophen (TYLENOL) 500 MG tablet Take 500 mg by mouth every 6 (six) hours as needed for pain. For pain      . Ascorbic Acid (VITAMIN C) 1000 MG tablet Take 1,000 mg by mouth daily.      . Biotin 1000 MCG tablet Take 5,000 mcg by mouth daily.       . Calcium Carbonate-Vitamin D (CALCIUM + D PO) Take 1 tablet by mouth daily. 1200mg  of Calcium and 800mg  Vit D      . Coenzyme Q10 (CO Q-10 PO) Take 100 mg by mouth daily. Hold while in hospital      . Cyanocobalamin (VITAMIN B 12 PO) Take 1 tablet by mouth daily.       . digoxin (LANOXIN) 0.125 MG tablet Take 1 tablet (125 mcg total) by mouth daily.  30 tablet  5  . ESTRACE VAGINAL 0.1 MG/GM vaginal cream Place 1 g vaginally 2 (two) times a week.       . estrogen-methylTESTOSTERone (ESTRATEST HS)  0.625-1.25 MG per tablet Take 1 tablet by mouth daily.       . furosemide (LASIX) 20 MG tablet Take 20 mg by mouth daily as needed. For fluid retention      . levothyroxine (SYNTHROID, LEVOTHROID) 112 MCG tablet Take 112 mcg by mouth daily.        . Magnesium 250 MG TABS Take 1 tablet by mouth daily.       . metoprolol tartrate (LOPRESSOR) 25 MG tablet       . Multiple Vitamin (MULTIVITAMIN WITH MINERALS) TABS Take 1 tablet by mouth daily.      Marland Kitchen OVER THE COUNTER MEDICATION Place 1 drop into both eyes daily as needed. OTC Dry Eyes Eyedrop  For dry eyes      . potassium chloride SA (K-DUR,KLOR-CON) 20 MEQ tablet Take 20 mEq by mouth daily as needed. When taking the lasix      . RABEprazole (ACIPHEX) 20 MG tablet Take 1 tablet (20 mg total) by mouth daily.  90 tablet  0  . trandolapril (MAVIK) 2 MG tablet Take 1 tablet (2 mg total) by mouth 2 (two) times daily.  180 tablet  3  . [DISCONTINUED] omeprazole (PRILOSEC) 20 MG capsule Take 1 capsule (20 mg total) by mouth daily.  30 capsule  11   No current facility-administered medications for this  visit.    Review of Systems Review of Systems  Constitutional: Negative for fever and chills.  Gastrointestinal: Negative for constipation.  Genitourinary: Negative for difficulty urinating.    Blood pressure 126/74, pulse 60, temperature 98 F (36.7 C), resp. rate 18, height 5' 8.5" (1.74 m), weight 140 lb (63.504 kg).  Physical Exam Physical Exam  Constitutional: No distress.  Thin, elderly female.  HENT:  Head: Normocephalic and atraumatic.  Cardiovascular: Normal rate and regular rhythm.   Pulmonary/Chest: Effort normal.  Pacemaker palpable and left upper chest wall.  Breath sounds distant but clear.  Abdominal: Soft. She exhibits no mass. There is no tenderness.  Genitourinary:  Right inguinal bulge that is only partially reducible. No left inguinal bulge. Shoddy left inguinal adenopathy.  Musculoskeletal:  Slight purplish discoloration of hands and fingers.  Neurological: She is alert.  Psychiatric:  Slightly anxious.    Data Reviewed CT scan from 2013 which shows right inguinal hernia containing fat. Notes from Dr. Jennette Kettle and Dr. Ladona Ridgel.  Assessment    Symptomatic right inguinal hernia. Cardiac issues seem to be stable.She is interested in repair.     Plan    We'll discuss repair with Dr. Ladona Ridgel to make sure that there are no contraindications from a cardiovascular standpoint.   We'll then proceed with open repair with mesh.  I have explained the procedure, risks, and aftercare of inguinal hernia repair.  Risks include but are not limited to bleeding, infection, wound problems, anesthesia, recurrence, bladder or intestine injury, urinary retention, testicular dysfunction, chronic pain, mesh problems.  She seems to understand and agrees to proceed.       Deslyn Cavenaugh J 08/02/2012, 12:47 PM

## 2012-08-04 ENCOUNTER — Ambulatory Visit: Payer: Medicare Other | Admitting: Cardiology

## 2012-08-04 ENCOUNTER — Telehealth: Payer: Self-pay | Admitting: Gastroenterology

## 2012-08-04 ENCOUNTER — Encounter: Payer: Self-pay | Admitting: Internal Medicine

## 2012-08-04 NOTE — Telephone Encounter (Signed)
Patient needs a hernia repair, she will call back to schedule her recall colonoscopy once she has recovered and Dr. Abbey Chatters clears her.  She wanted Dr. Russella Dar to know that she hasn't forgot.

## 2012-08-07 ENCOUNTER — Ambulatory Visit (INDEPENDENT_AMBULATORY_CARE_PROVIDER_SITE_OTHER): Payer: Self-pay | Admitting: General Surgery

## 2012-08-08 DIAGNOSIS — D239 Other benign neoplasm of skin, unspecified: Secondary | ICD-10-CM | POA: Diagnosis not present

## 2012-08-08 DIAGNOSIS — L219 Seborrheic dermatitis, unspecified: Secondary | ICD-10-CM | POA: Diagnosis not present

## 2012-08-08 DIAGNOSIS — L821 Other seborrheic keratosis: Secondary | ICD-10-CM | POA: Diagnosis not present

## 2012-08-08 DIAGNOSIS — D1801 Hemangioma of skin and subcutaneous tissue: Secondary | ICD-10-CM | POA: Diagnosis not present

## 2012-08-08 DIAGNOSIS — Z85820 Personal history of malignant melanoma of skin: Secondary | ICD-10-CM | POA: Diagnosis not present

## 2012-08-08 DIAGNOSIS — L819 Disorder of pigmentation, unspecified: Secondary | ICD-10-CM | POA: Diagnosis not present

## 2012-08-09 ENCOUNTER — Telehealth: Payer: Self-pay | Admitting: Internal Medicine

## 2012-08-09 NOTE — Telephone Encounter (Addendum)
error 

## 2012-08-17 ENCOUNTER — Ambulatory Visit: Payer: Medicare Other | Admitting: Cardiology

## 2012-08-25 DIAGNOSIS — J0120 Acute ethmoidal sinusitis, unspecified: Secondary | ICD-10-CM | POA: Diagnosis not present

## 2012-08-25 DIAGNOSIS — J330 Polyp of nasal cavity: Secondary | ICD-10-CM | POA: Diagnosis not present

## 2012-09-11 ENCOUNTER — Telehealth (INDEPENDENT_AMBULATORY_CARE_PROVIDER_SITE_OTHER): Payer: Self-pay

## 2012-09-11 NOTE — Telephone Encounter (Signed)
Pt called c/o bulging and stinging/burning at her hernia site.  Surgery is scheduled for 09/26/12, and she was concerned about waiting until then.  Dr. Abbey Chatters advised limit activity as much as possible.  If she has to be on her feet for long periods of time take several breaks and put feet up.  Patient seemed relieved and will comply.

## 2012-09-12 ENCOUNTER — Telehealth: Payer: Self-pay | Admitting: Cardiology

## 2012-09-12 ENCOUNTER — Other Ambulatory Visit: Payer: Self-pay

## 2012-09-12 MED ORDER — TRANDOLAPRIL 2 MG PO TABS: 2.0000 mg | ORAL_TABLET | Freq: Two times a day (BID) | ORAL | Status: DC

## 2012-09-12 NOTE — Telephone Encounter (Signed)
trandolapril (MAVIK) 2 MG tablet  Take 1 tablet (2 mg total) by mouth 2 (two) times daily.   180 tablet   3

## 2012-09-12 NOTE — Telephone Encounter (Signed)
New Prob     Pt calling following up on a refill medication. Please call.

## 2012-09-12 NOTE — Telephone Encounter (Signed)
Patient called no answer.LMTC. 

## 2012-09-13 ENCOUNTER — Telehealth: Payer: Self-pay

## 2012-09-13 MED ORDER — TRANDOLAPRIL 2 MG PO TABS: 2.0000 mg | ORAL_TABLET | Freq: Two times a day (BID) | ORAL | Status: DC

## 2012-09-13 NOTE — Telephone Encounter (Signed)
Spoke to patient she stated she has a stinging sensation top of pacemaker incision.Stated if feels irritated.Message sent to pacemaker nurse Gunnar Fusi.

## 2012-09-13 NOTE — Telephone Encounter (Signed)
New Problem:    Patient called in returning your call. Please call back. 

## 2012-09-13 NOTE — Telephone Encounter (Signed)
Patient called she stated she will be having inguinal hernia repair by Dr.Rosenbower 10/02/12.Dr.Jordan advised ok for surgery.Note faxed to Crista Elliot fax # [phone].

## 2012-09-13 NOTE — Telephone Encounter (Signed)
Returned call to patient she stated she is having trouble with getting medication refilled.Stated she just had medication refilled and was given a 90 day supply and she dont like 90 day supply on all her meds.only on one med she gets a 90 day supply.Advised next time she needs refills call me and make sure to let me know if you want 90 day or 30 day refill.

## 2012-09-14 DIAGNOSIS — D3130 Benign neoplasm of unspecified choroid: Secondary | ICD-10-CM | POA: Diagnosis not present

## 2012-09-14 NOTE — Telephone Encounter (Signed)
Spoke with patient and offered her an appointment for 7/11 but she refused and will come in 7/16 for a wound check.

## 2012-09-15 ENCOUNTER — Encounter (HOSPITAL_COMMUNITY): Payer: Self-pay | Admitting: Pharmacy Technician

## 2012-09-17 ENCOUNTER — Encounter: Payer: Self-pay | Admitting: Internal Medicine

## 2012-09-17 DIAGNOSIS — J329 Chronic sinusitis, unspecified: Secondary | ICD-10-CM | POA: Insufficient documentation

## 2012-09-17 DIAGNOSIS — F419 Anxiety disorder, unspecified: Secondary | ICD-10-CM | POA: Insufficient documentation

## 2012-09-17 DIAGNOSIS — E785 Hyperlipidemia, unspecified: Secondary | ICD-10-CM | POA: Insufficient documentation

## 2012-09-17 DIAGNOSIS — F329 Major depressive disorder, single episode, unspecified: Secondary | ICD-10-CM | POA: Insufficient documentation

## 2012-09-17 DIAGNOSIS — J449 Chronic obstructive pulmonary disease, unspecified: Secondary | ICD-10-CM | POA: Insufficient documentation

## 2012-09-17 DIAGNOSIS — G2581 Restless legs syndrome: Secondary | ICD-10-CM | POA: Insufficient documentation

## 2012-09-17 DIAGNOSIS — F32A Depression, unspecified: Secondary | ICD-10-CM | POA: Insufficient documentation

## 2012-09-17 DIAGNOSIS — L659 Nonscarring hair loss, unspecified: Secondary | ICD-10-CM | POA: Insufficient documentation

## 2012-09-17 DIAGNOSIS — J42 Unspecified chronic bronchitis: Secondary | ICD-10-CM | POA: Insufficient documentation

## 2012-09-17 DIAGNOSIS — E039 Hypothyroidism, unspecified: Secondary | ICD-10-CM | POA: Insufficient documentation

## 2012-09-19 ENCOUNTER — Encounter (HOSPITAL_COMMUNITY)
Admission: RE | Admit: 2012-09-19 | Discharge: 2012-09-19 | Disposition: A | Discharge status: Home / Self Care | Payer: Medicare Other | Source: Ambulatory Visit | Attending: General Surgery | Admitting: General Surgery

## 2012-09-19 ENCOUNTER — Ambulatory Visit (HOSPITAL_COMMUNITY)
Admission: RE | Admit: 2012-09-19 | Discharge: 2012-09-19 | Disposition: A | Discharge status: Home / Self Care | Payer: Medicare Other | Source: Ambulatory Visit | Attending: General Surgery | Admitting: General Surgery

## 2012-09-19 ENCOUNTER — Encounter (HOSPITAL_COMMUNITY): Payer: Self-pay

## 2012-09-19 DIAGNOSIS — J449 Chronic obstructive pulmonary disease, unspecified: Secondary | ICD-10-CM | POA: Insufficient documentation

## 2012-09-19 DIAGNOSIS — I10 Essential (primary) hypertension: Secondary | ICD-10-CM | POA: Insufficient documentation

## 2012-09-19 DIAGNOSIS — E039 Hypothyroidism, unspecified: Secondary | ICD-10-CM | POA: Insufficient documentation

## 2012-09-19 DIAGNOSIS — J4489 Other specified chronic obstructive pulmonary disease: Secondary | ICD-10-CM | POA: Insufficient documentation

## 2012-09-19 DIAGNOSIS — Z01818 Encounter for other preprocedural examination: Secondary | ICD-10-CM | POA: Diagnosis not present

## 2012-09-19 DIAGNOSIS — Z950 Presence of cardiac pacemaker: Secondary | ICD-10-CM | POA: Insufficient documentation

## 2012-09-19 DIAGNOSIS — K2270 Barrett's esophagus without dysplasia: Secondary | ICD-10-CM | POA: Insufficient documentation

## 2012-09-19 DIAGNOSIS — I059 Rheumatic mitral valve disease, unspecified: Secondary | ICD-10-CM | POA: Insufficient documentation

## 2012-09-19 DIAGNOSIS — K4090 Unilateral inguinal hernia, without obstruction or gangrene, not specified as recurrent: Secondary | ICD-10-CM | POA: Diagnosis not present

## 2012-09-19 HISTORY — DX: Adverse effect of unspecified anesthetic, initial encounter: T41.45XA

## 2012-09-19 HISTORY — DX: Presence of cardiac pacemaker: Z95.0

## 2012-09-19 HISTORY — DX: Other complications of anesthesia, initial encounter: T88.59XA

## 2012-09-19 LAB — PROTIME-INR
INR: 1.08 (ref 0.00–1.49)
Prothrombin Time: 13.8 seconds (ref 11.6–15.2)

## 2012-09-19 LAB — CBC WITH DIFFERENTIAL/PLATELET
Basophils Relative: 0 % (ref 0–1)
Eosinophils Absolute: 0.3 10*3/uL (ref 0.0–0.7)
HCT: 41.9 % (ref 36.0–46.0)
Hemoglobin: 15.1 g/dL — ABNORMAL HIGH (ref 12.0–15.0)
MCH: 31.9 pg (ref 26.0–34.0)
MCHC: 36 g/dL (ref 30.0–36.0)
Monocytes Absolute: 1 10*3/uL (ref 0.1–1.0)
Monocytes Relative: 12 % (ref 3–12)

## 2012-09-19 LAB — COMPREHENSIVE METABOLIC PANEL
Albumin: 3.9 g/dL (ref 3.5–5.2)
BUN: 13 mg/dL (ref 6–23)
Creatinine, Ser: 0.66 mg/dL (ref 0.50–1.10)
Total Bilirubin: 0.3 mg/dL (ref 0.3–1.2)
Total Protein: 6.8 g/dL (ref 6.0–8.3)

## 2012-09-19 NOTE — Progress Notes (Signed)
Pt has an appointment to see Dr Ladona Ridgel in am concerning pacemaker tenderness..Order form for Implanted device fax to Dr Ladona Ridgel 's office.

## 2012-09-19 NOTE — Pre-Procedure Instructions (Signed)
[name]  09/19/2012   Your procedure is scheduled on:  Tuesday September 26, 2012  Report to [facility] at 1000 AM.  Call this number if you have problems the morning of surgery: [phone]   Remember:   Do not eat food or drink liquids after midnight.   Take these medicines the morning of surgery with A SIP OF WATER: Synthroid, Metoprolol, and Rabeprazole   Do not wear jewelry, make-up or nail polish.  Do not wear lotions, powders, or perfumes. You may wear deodorant.  Do not shave 48 hours prior to surgery.   Do not bring valuables to the hospital.  [facility] is not responsible for any belongings or valuables.  Contacts, dentures or bridgework may not be worn into surgery.  Leave suitcase in the car. After surgery it may be brought to your room.  For patients admitted to the hospital, checkout time is 11:00 AM the day of  discharge.   Patients discharged the day of surgery will not be allowed to drive home.    Special Instructions: Shower using CHG 2 nights before surgery and the night before surgery.  If you shower the day of surgery use CHG.  Use special wash - you have one bottle of CHG for all showers.  You should use approximately 1/3 of the bottle for each shower.   Please read over the following fact sheets that you were given: Pain Booklet, Coughing and Deep Breathing and Surgical Site Infection Prevention

## 2012-09-20 ENCOUNTER — Encounter (HOSPITAL_COMMUNITY): Payer: Self-pay

## 2012-09-20 ENCOUNTER — Ambulatory Visit (INDEPENDENT_AMBULATORY_CARE_PROVIDER_SITE_OTHER): Payer: Medicare Other | Admitting: *Deleted

## 2012-09-20 DIAGNOSIS — I5022 Chronic systolic (congestive) heart failure: Secondary | ICD-10-CM

## 2012-09-20 DIAGNOSIS — I428 Other cardiomyopathies: Secondary | ICD-10-CM | POA: Diagnosis not present

## 2012-09-20 LAB — PACEMAKER DEVICE OBSERVATION: AL AMPLITUDE: 2.4 mv

## 2012-09-20 NOTE — Progress Notes (Signed)
PPM interrogation in office. 

## 2012-09-20 NOTE — Progress Notes (Signed)
Anesthesia chart review: Patient is a 79 year old female scheduled for right inguinal hernia repair on 09/26/2012 by Dr. Abbey Chatters.    History includes former smoker, non-ischemic cardiomyopathy, chronic systolic CHF, s/p BiV ICD 05/14/05 but removed and replaced with a BiV pacemaker [employer]) on 04/26/12, chronic left BBB, mitral regurgitation (mild by 03/2012 echo), hypothyroidism, GERD, HTN, RLS, depression, anxiety, COPD, HLD, OSA, Barrett's esophagus, melanoma of the right arm, right breast lumpectomy, hysterectomy, appendectomy.  She reports that she is slow to wake up from anesthesia.  PCP is listed as Dr. Murray Hodgkins.    Cardiologist is Dr. Swaziland.  EP Cardiologist is Dr. Ladona Ridgel with last PPM check on 08/01/12.  Dr. Swaziland is aware of upcoming surgery, and felt she was "ok for surgery" (see Pugh, LPN note from 1/61/09).  She has a nurse visit at the [facility] later today to be evaluated for "stinging sensation" at the pacemaker insertion site and ensure no issues prior to her surgery.  EKG on 08/01/12 showed v-paced rhythm.  Echo on 03/21/12 showed: - Left ventricle: The cavity size was normal. Wall thickness was normal. Systolic function was mildly reduced. The estimated ejection fraction was in the range of 45% to 50%. Diffuse hypokinesis. Left ventricular diastolic function parameters were normal. - Aortic valve: Trivial regurgitation. - Mitral valve: Mild regurgitation. - Left atrium: The atrium was mildly dilated. - Pulmonic valve: Trivial regurgitation. - Tricuspid valve: Mild regurgitation.  Cardiac cath on 07/03/07 showed normal coronaries, severe dilated cardiomyopathy with severe mitral regurgitation.  CXR on 09/19/12 showed no acute findings.  Preoperative labs noted.  Dr. Swaziland is aware of upcoming surgery.  Her PPM site is being checked this afternoon to ensure there are no issues prior to surgery.  If site felt okay and otherwise no acute changes then I would  anticipate that she could proceed as planned.  Velna [facility] Short Stay Center/Anesthesiology Phone [phone] 09/20/2012 10:26 AM

## 2012-09-20 NOTE — Progress Notes (Signed)
Device orders returned notified Barbara Cower with [employer] they will arrive by 11:00 to reprogram device per orders.

## 2012-09-25 MED ORDER — VANCOMYCIN HCL IN DEXTROSE 1-5 GM/200ML-% IV SOLN
1000.0000 mg | INTRAVENOUS | Status: AC
  Administered 2012-09-26: 1000 mg via INTRAVENOUS
  Filled 2012-09-25: qty 200

## 2012-09-26 ENCOUNTER — Encounter (HOSPITAL_COMMUNITY): Payer: Self-pay | Admitting: *Deleted

## 2012-09-26 ENCOUNTER — Ambulatory Visit (HOSPITAL_COMMUNITY)
Admission: RE | Admit: 2012-09-26 | Discharge: 2012-09-26 | Disposition: A | Discharge status: Home / Self Care | Payer: Medicare Other | Source: Ambulatory Visit | Attending: General Surgery | Admitting: General Surgery

## 2012-09-26 ENCOUNTER — Ambulatory Visit (HOSPITAL_COMMUNITY): Payer: Medicare Other | Admitting: Certified Registered"

## 2012-09-26 ENCOUNTER — Encounter (HOSPITAL_COMMUNITY)
Admission: RE | Disposition: A | Discharge status: Home / Self Care | Payer: Self-pay | Source: Ambulatory Visit | Attending: General Surgery

## 2012-09-26 ENCOUNTER — Encounter (HOSPITAL_COMMUNITY): Payer: Self-pay | Admitting: Vascular Surgery

## 2012-09-26 DIAGNOSIS — Z860100 Personal history of colon polyps, unspecified: Secondary | ICD-10-CM | POA: Insufficient documentation

## 2012-09-26 DIAGNOSIS — G2581 Restless legs syndrome: Secondary | ICD-10-CM | POA: Diagnosis not present

## 2012-09-26 DIAGNOSIS — J449 Chronic obstructive pulmonary disease, unspecified: Secondary | ICD-10-CM | POA: Diagnosis not present

## 2012-09-26 DIAGNOSIS — F411 Generalized anxiety disorder: Secondary | ICD-10-CM | POA: Diagnosis not present

## 2012-09-26 DIAGNOSIS — F3289 Other specified depressive episodes: Secondary | ICD-10-CM | POA: Insufficient documentation

## 2012-09-26 DIAGNOSIS — I10 Essential (primary) hypertension: Secondary | ICD-10-CM | POA: Diagnosis not present

## 2012-09-26 DIAGNOSIS — G4730 Sleep apnea, unspecified: Secondary | ICD-10-CM | POA: Diagnosis not present

## 2012-09-26 DIAGNOSIS — Z87891 Personal history of nicotine dependence: Secondary | ICD-10-CM | POA: Insufficient documentation

## 2012-09-26 DIAGNOSIS — K5730 Diverticulosis of large intestine without perforation or abscess without bleeding: Secondary | ICD-10-CM | POA: Diagnosis not present

## 2012-09-26 DIAGNOSIS — Z950 Presence of cardiac pacemaker: Secondary | ICD-10-CM | POA: Diagnosis not present

## 2012-09-26 DIAGNOSIS — K2270 Barrett's esophagus without dysplasia: Secondary | ICD-10-CM | POA: Diagnosis not present

## 2012-09-26 DIAGNOSIS — Z85820 Personal history of malignant melanoma of skin: Secondary | ICD-10-CM | POA: Diagnosis not present

## 2012-09-26 DIAGNOSIS — I059 Rheumatic mitral valve disease, unspecified: Secondary | ICD-10-CM | POA: Insufficient documentation

## 2012-09-26 DIAGNOSIS — K4090 Unilateral inguinal hernia, without obstruction or gangrene, not specified as recurrent: Secondary | ICD-10-CM | POA: Insufficient documentation

## 2012-09-26 DIAGNOSIS — Z79899 Other long term (current) drug therapy: Secondary | ICD-10-CM | POA: Diagnosis not present

## 2012-09-26 DIAGNOSIS — K219 Gastro-esophageal reflux disease without esophagitis: Secondary | ICD-10-CM | POA: Diagnosis not present

## 2012-09-26 DIAGNOSIS — E039 Hypothyroidism, unspecified: Secondary | ICD-10-CM | POA: Insufficient documentation

## 2012-09-26 DIAGNOSIS — F329 Major depressive disorder, single episode, unspecified: Secondary | ICD-10-CM | POA: Insufficient documentation

## 2012-09-26 DIAGNOSIS — I428 Other cardiomyopathies: Secondary | ICD-10-CM | POA: Diagnosis not present

## 2012-09-26 DIAGNOSIS — I509 Heart failure, unspecified: Secondary | ICD-10-CM | POA: Insufficient documentation

## 2012-09-26 DIAGNOSIS — J4489 Other specified chronic obstructive pulmonary disease: Secondary | ICD-10-CM | POA: Insufficient documentation

## 2012-09-26 DIAGNOSIS — I447 Left bundle-branch block, unspecified: Secondary | ICD-10-CM | POA: Insufficient documentation

## 2012-09-26 HISTORY — PX: INGUINAL HERNIA REPAIR: SHX194

## 2012-09-26 HISTORY — PX: INSERTION OF MESH: SHX5868

## 2012-09-26 SURGERY — REPAIR, HERNIA, INGUINAL, ADULT
Anesthesia: Choice | Site: Groin | Laterality: Right | Wound class: Clean

## 2012-09-26 MED ORDER — BUPIVACAINE-EPINEPHRINE 0.5% -1:200000 IJ SOLN
INTRAMUSCULAR | Status: DC | PRN
  Administered 2012-09-26: 20 mL

## 2012-09-26 MED ORDER — OXYCODONE HCL 5 MG PO TABS
ORAL_TABLET | ORAL | Status: AC
  Filled 2012-09-26: qty 1

## 2012-09-26 MED ORDER — MIDAZOLAM HCL 5 MG/5ML IJ SOLN
INTRAMUSCULAR | Status: DC | PRN
  Administered 2012-09-26: 2 mg via INTRAVENOUS

## 2012-09-26 MED ORDER — LIDOCAINE HCL (CARDIAC) 20 MG/ML IV SOLN
INTRAVENOUS | Status: DC | PRN
  Administered 2012-09-26: 50 mg via INTRAVENOUS

## 2012-09-26 MED ORDER — FENTANYL CITRATE 0.05 MG/ML IJ SOLN
INTRAMUSCULAR | Status: DC | PRN
  Administered 2012-09-26 (×2): 25 ug via INTRAVENOUS

## 2012-09-26 MED ORDER — LACTATED RINGERS IV SOLN
INTRAVENOUS | Status: DC
  Administered 2012-09-26 (×2): via INTRAVENOUS

## 2012-09-26 MED ORDER — ONDANSETRON HCL 4 MG/2ML IJ SOLN: 4.0000 mg | Freq: Once | INTRAMUSCULAR | Status: DC | PRN

## 2012-09-26 MED ORDER — HYDROMORPHONE HCL PF 1 MG/ML IJ SOLN: 0.2500 mg | INTRAMUSCULAR | Status: DC | PRN

## 2012-09-26 MED ORDER — HYDROCODONE-ACETAMINOPHEN 5-325 MG PO TABS
1.0000 | ORAL_TABLET | ORAL | Status: DC | PRN
Start: 2012-09-26 — End: 2012-10-13

## 2012-09-26 MED ORDER — ONDANSETRON HCL 4 MG/2ML IJ SOLN
INTRAMUSCULAR | Status: DC | PRN
  Administered 2012-09-26: 4 mg via INTRAVENOUS

## 2012-09-26 MED ORDER — PROPOFOL 10 MG/ML IV BOLUS
INTRAVENOUS | Status: DC | PRN
  Administered 2012-09-26: 140 mg via INTRAVENOUS

## 2012-09-26 MED ORDER — BUPIVACAINE-EPINEPHRINE (PF) 0.5% -1:200000 IJ SOLN
INTRAMUSCULAR | Status: AC
  Filled 2012-09-26: qty 10

## 2012-09-26 MED ORDER — OXYCODONE HCL 5 MG PO TABS
5.0000 mg | ORAL_TABLET | ORAL | Status: DC | PRN
  Administered 2012-09-26: 5 mg via ORAL

## 2012-09-26 SURGICAL SUPPLY — 46 items
BENZOIN TINCTURE PRP APPL 2/3 (GAUZE/BANDAGES/DRESSINGS) ×2 IMPLANT
BLADE SURG 10 STRL SS (BLADE) IMPLANT
BLADE SURG 15 STRL LF DISP TIS (BLADE) IMPLANT
BLADE SURG 15 STRL SS (BLADE)
BLADE SURG ROTATE 9660 (MISCELLANEOUS) IMPLANT
CHLORAPREP W/TINT 26ML (MISCELLANEOUS) ×2 IMPLANT
CLOTH BEACON ORANGE TIMEOUT ST (SAFETY) ×2 IMPLANT
CLSR STERI-STRIP ANTIMIC 1/2X4 (GAUZE/BANDAGES/DRESSINGS) ×2 IMPLANT
COVER SURGICAL LIGHT HANDLE (MISCELLANEOUS) ×2 IMPLANT
DRAIN PENROSE 1/2X12 LTX STRL (WOUND CARE) IMPLANT
DRAPE INCISE IOBAN 66X45 STRL (DRAPES) ×2 IMPLANT
DRAPE LAPAROTOMY TRNSV 102X78 (DRAPE) ×2 IMPLANT
DRAPE UTILITY 15X26 W/TAPE STR (DRAPE) ×4 IMPLANT
DRESSING TELFA 8X3 (GAUZE/BANDAGES/DRESSINGS) ×2 IMPLANT
DRSG MEPILEX BORDER 4X4 (GAUZE/BANDAGES/DRESSINGS) ×2 IMPLANT
DRSG OPSITE 6X11 MED (GAUZE/BANDAGES/DRESSINGS) ×2 IMPLANT
DRSG OPSITE POSTOP 4X8 (GAUZE/BANDAGES/DRESSINGS) ×2 IMPLANT
ELECT CAUTERY BLADE 6.4 (BLADE) ×2 IMPLANT
ELECT REM PT RETURN 9FT ADLT (ELECTROSURGICAL) ×2
ELECTRODE REM PT RTRN 9FT ADLT (ELECTROSURGICAL) ×1 IMPLANT
GLOVE BIOGEL PI IND STRL 8 (GLOVE) ×1 IMPLANT
GLOVE BIOGEL PI INDICATOR 8 (GLOVE) ×1
GLOVE ECLIPSE 8.0 STRL XLNG CF (GLOVE) IMPLANT
GOWN STRL NON-REIN LRG LVL3 (GOWN DISPOSABLE) ×4 IMPLANT
KIT BASIN OR (CUSTOM PROCEDURE TRAY) ×2 IMPLANT
KIT ROOM TURNOVER OR (KITS) ×2 IMPLANT
MESH HERNIA 3X6 (Mesh General) ×2 IMPLANT
NEEDLE HYPO 25GX1X1/2 BEV (NEEDLE) ×2 IMPLANT
NS IRRIG 1000ML POUR BTL (IV SOLUTION) ×2 IMPLANT
PACK SURGICAL SETUP 50X90 (CUSTOM PROCEDURE TRAY) ×2 IMPLANT
PAD ARMBOARD 7.5X6 YLW CONV (MISCELLANEOUS) ×2 IMPLANT
PENCIL BUTTON HOLSTER BLD 10FT (ELECTRODE) ×2 IMPLANT
SPECIMEN JAR SMALL (MISCELLANEOUS) IMPLANT
SPONGE GAUZE 4X4 12PLY (GAUZE/BANDAGES/DRESSINGS) ×2 IMPLANT
SPONGE LAP 18X18 X RAY DECT (DISPOSABLE) IMPLANT
STRIP CLOSURE SKIN 1/2X4 (GAUZE/BANDAGES/DRESSINGS) ×2 IMPLANT
SUT MON AB 4-0 PC3 18 (SUTURE) ×2 IMPLANT
SUT PROLENE 2 0 CT2 30 (SUTURE) ×4 IMPLANT
SUT SILK 2 0 SH (SUTURE) IMPLANT
SUT VIC AB 2-0 SH 18 (SUTURE) ×2 IMPLANT
SUT VIC AB 3-0 SH 27 (SUTURE) ×1
SUT VIC AB 3-0 SH 27XBRD (SUTURE) ×1 IMPLANT
SUT VICRYL AB 3 0 TIES (SUTURE) ×2 IMPLANT
SYR CONTROL 10ML LL (SYRINGE) ×2 IMPLANT
TOWEL OR 17X24 6PK STRL BLUE (TOWEL DISPOSABLE) ×2 IMPLANT
TOWEL OR 17X26 10 PK STRL BLUE (TOWEL DISPOSABLE) ×2 IMPLANT

## 2012-09-26 NOTE — Op Note (Signed)
Preoperative diagnosis:  Right inguinal hernia.  Postoperative diagnosis:  Same  Procedure:  Right inguinal hernia repair with mesh.  Surgeon:  Avel Peace, M.D.  Anesthesia:  General/LMA with local (Marcaine).  Indication:  This is a 79 year old female with a right inguinal bulge that does become uncomfortable. She has a right inguinal hernia on examination. She now presents for repair. She has some chronic cardiac conditions but these are felt to be well controlled by her cardiologist and thus she is not at excessive cardiovascular risk for this procedure.  Technique:  She was seen in the holding room and the right groin was marked with my initials. She was brought to the operating, placed supine on the operating table, and the anesthetic was administered by the anesthesiologist. The hair in the groin area was clipped as was felt to be necessary. This area was then sterilely prepped and draped.  Local anesthetic was infiltrated in the superficial and deep tissues in the right groin.  An incision was made through the skin and subcutaneous tissue until the external oblique aponeurosis was identified.  Local anesthetic was infiltrated deep to the external oblique aponeurosis. The external oblique aponeurosis was divided through the external ring medially and back toward the anterior superior iliac spine laterally. Using blunt and sharp dissection, the shelving edge of the inguinal ligament was identified inferiorly and the internal oblique aponeurosis and muscle were identified superiorly. The ilioinguinal nerve was identified and preserved.  The round ligament was isolated and a posterior window was made around it. An indirect hernia  was identified and separated from the round ligament using blunt dissection. The hernia was reduced through the indirect hernia defect.   A piece of 3" x 6" polypropylene mesh was brought into the field and anchored 1-2 cm medial to the pubic tubercle with 2-0  Prolene suture. The inferior aspect of the mesh was anchored to the shelving edge of the inguinal ligament with running 2-0 Prolene suture to a level 1-2 cm lateral to the internal ring. A slit was cut in the mesh creating 2 tails. These were wrapped around the round ligament. The superior aspect of the mesh was anchored to the internal oblique aponeurosis and muscle with interrupted 2-0 Vicryl sutures. The 2 tails of the mesh were then crossed creating a new internal ring and were anchored to the shelving edge of the inguinal ligament with 2-0 Prolene suture. The lateral aspect of the mesh was then tucked deep to the external oblique aponeurosis.  The wound was inspected and hemostasis was adequate. The external oblique aponeurosis was then closed over the mesh with running 3-0 Vicryl suture. The subcutaneous tissue was closed with running 3-0 Vicryl suture. The skin closed with a running 4-0 Monocryl subcuticular stitch.  Steri-Strips and a sterile dressing were applied.  The procedure was well-tolerated without any apparent complications and the patient was taken to the recovery room in satisfactory condition.

## 2012-09-26 NOTE — H&P (Signed)
[name] is an 79 y.o. female.   Chief Complaint:  Here for elective right inguinal hernia repair HPI:   She noticed a small bulge in the right groin about a year ago. It has become larger and somewhat uncomfortable at times.  It is consistent with a right inguinal hernia on exam. She noticed a small little area in the left lateral groin as well but it is not consistent with a hernia . No difficulty with urination or constipation. No chronic cough.    Past Medical History  Diagnosis Date  . Nonischemic cardiomyopathy     EF initially 20%; Last measurement up to 50% per echo in August of 2012  . CHF (congestive heart failure)   . LBBB (left bundle branch block)   . Mitral regurgitation   . Melanoma     right arm  . Hypothyroidism   . Erosive gastritis 08/2004  . Hyperplastic polyps of stomach 06/2002  . Barrett's esophagus 04/2008  . GERD (gastroesophageal reflux disease)   . Diverticulitis   . Anxiety disorder   . HTN (hypertension)   . ICD (implantable cardiac defibrillator) in place     BiV/ICD; s/p removal of ICD with insertion of BiV PPM 04/26/12  . Hair thinning   . Unspecified sinusitis (chronic)   . Unspecified chronic bronchitis   . Depressive disorder, not elsewhere classified   . Restless legs syndrome (RLS)   . Chronic airway obstruction, not elsewhere classified   . Benign neoplasm of colon   . Other and unspecified hyperlipidemia   . Unspecified nasal polyp   . Pain in joint, lower leg   . Synovial cyst of popliteal space   . Complication of anesthesia     slow to wake up  . Pacemaker 04/26/2012  . Sleep apnea     Past Surgical History  Procedure Laterality Date  . Defibrillator insertion      s/p removal of previously implanted BiV ICD and insertion of a new BiV pacemaker on 04/26/12  . Left arm surgery      left  . Abdominal hysterectomy  1076    endometriosis  . Right breast lumpectomy  1988    Dr. Francina Ames  . Appendectomy    . Tonsillectomy     . Right knee surgery      arthroscopy: Dr. Simonne Come  . Excision of wen  1984    Dr. Valrie Hart  . Excise mole of lip  2001    Dr. Everlene Farrier    Family History  Problem Relation Age of Onset  . Heart disease Other     maternal side  . Colon cancer Paternal Uncle   . Breast cancer Maternal Aunt   . Diabetes Neg Hx   . Stroke Father    Social History:  reports that she has quit smoking. Her smoking use included Cigarettes. She smoked 0.00 packs per day. She has never used smokeless tobacco. She reports that she does not drink alcohol or use illicit drugs.  Allergies:  Allergies  Allergen Reactions  . Cephalexin Itching and Swelling  . Doxycycline Other (See Comments)    Per pt: unknown  . Ofloxacin Other (See Comments)    Per pt: unknown  . Tape Other (See Comments)    Burns skin.   . Latex Rash    Medications Prior to Admission  Medication Sig Dispense Refill  . acetaminophen (TYLENOL) 500 MG tablet Take 500 mg by mouth every 6 (six) hours as needed for  pain. For pain      . Ascorbic Acid (VITAMIN C) 1000 MG tablet Take 1,000 mg by mouth daily.      . Biotin 1000 MCG tablet Take 5,000 mcg by mouth daily.       . Calcium Carbonate-Vitamin D (CALCIUM + D PO) Take 1 tablet by mouth daily. 1200mg  of Calcium and 1000mg  Vit D      . Coenzyme Q10 (CO Q-10 PO) Take 100 mg by mouth daily. Hold while in hospital      . Cyanocobalamin (VITAMIN B 12 PO) Take 1 tablet by mouth daily.       . digoxin (LANOXIN) 0.125 MG tablet Take 1 tablet (125 mcg total) by mouth daily.  30 tablet  5  . ESTRACE VAGINAL 0.1 MG/GM vaginal cream Place 1 g vaginally 2 (two) times a week.       . estrogen-methylTESTOSTERone (ESTRATEST HS) 0.625-1.25 MG per tablet Take 1 tablet by mouth daily.       . furosemide (LASIX) 20 MG tablet Take 20 mg by mouth daily as needed. For fluid retention      . levothyroxine (SYNTHROID, LEVOTHROID) 112 MCG tablet Take 112 mcg by mouth daily.        . Magnesium 250 MG TABS  Take 1 tablet by mouth daily.       . metoprolol tartrate (LOPRESSOR) 25 MG tablet Take 12.5 mg by mouth daily.       . Multiple Vitamin (MULTIVITAMIN WITH MINERALS) TABS Take 1 tablet by mouth daily.      Marland Kitchen OVER THE COUNTER MEDICATION Place 1 drop into both eyes daily as needed. OTC Dry Eyes Eyedrop  For dry eyes      . potassium chloride SA (K-DUR,KLOR-CON) 20 MEQ tablet Take 20 mEq by mouth daily as needed. When taking the lasix      . RABEprazole (ACIPHEX) 20 MG tablet Take 1 tablet (20 mg total) by mouth daily.  90 tablet  0  . trandolapril (MAVIK) 2 MG tablet Take 1 tablet (2 mg total) by mouth 2 (two) times daily.  60 tablet  6    No results found for this or any previous visit (from the past 48 hour(s)). No results found.  Review of Systems  Constitutional: Negative for fever and chills.  Respiratory: Negative for cough.   Gastrointestinal: Negative for nausea, vomiting and abdominal pain.    Blood pressure 153/64, pulse 55, temperature 97.1 F (36.2 C), temperature source Oral, resp. rate 20, SpO2 100.00%. Physical Exam  Constitutional: No distress.  Thin, elderly female.  Cardiovascular: Normal rate and regular rhythm.   Respiratory: Effort normal and breath sounds normal.  Pacemaker in left upper chest wall.  GI: Soft. There is no tenderness.  Genitourinary:  Right inguinal bulge.  Musculoskeletal: She exhibits no edema.  Neurological: She is alert.  Skin: Skin is warm and dry.     Assessment/Plan Right inguinal hernia  Plan:  Right inguinal hernia repair with mesh.  Celestino Ackerman J 09/26/2012, 11:51 AM

## 2012-09-26 NOTE — Preoperative (Signed)
Beta Blockers   Reason not to administer Beta Blockers:Not Applicable 

## 2012-09-26 NOTE — Transfer of Care (Signed)
Immediate Anesthesia Transfer of Care Note  Patient: [name]  Procedure(s) Performed: Procedure(s): RIGHT INGUINAL HERNIA REPAIR WITH MESH (Right) INSERTION OF MESH (Right)  Patient Location: PACU  Anesthesia Type:General  Level of Consciousness: awake, alert , oriented and patient cooperative  Airway & Oxygen Therapy: Patient Spontanous Breathing and Patient connected to nasal cannula oxygen  Post-op Assessment: Report given to PACU RN, Post -op Vital signs reviewed and stable and Patient moving all extremities  Post vital signs: Reviewed and stable  Complications: No apparent anesthesia complications

## 2012-09-26 NOTE — Anesthesia Preprocedure Evaluation (Signed)
Anesthesia Evaluation  Patient identified by MRN, date of birth, ID band Patient awake    Reviewed: Allergy & Precautions, H&P , NPO status , Patient's Chart, lab work & pertinent test results  Airway Mallampati: II      Dental  (+) Teeth Intact and Dental Advisory Given   Pulmonary  breath sounds clear to auscultation        Cardiovascular Rhythm:Regular Rate:Normal     Neuro/Psych    GI/Hepatic   Endo/Other    Renal/GU      Musculoskeletal   Abdominal   Peds  Hematology   Anesthesia Other Findings   Reproductive/Obstetrics                           Anesthesia Physical Anesthesia Plan  ASA: III  Anesthesia Plan: General   Post-op Pain Management:    Induction: Intravenous  Airway Management Planned: LMA  Additional Equipment:   Intra-op Plan:   Post-operative Plan:   Informed Consent:   Dental advisory given  Plan Discussed with: CRNA and Anesthesiologist  Anesthesia Plan Comments:         Anesthesia Quick Evaluation

## 2012-09-26 NOTE — Anesthesia Postprocedure Evaluation (Signed)
  Anesthesia Post-op Note  Patient: [name]  Procedure(s) Performed: Procedure(s): RIGHT INGUINAL HERNIA REPAIR WITH MESH (Right) INSERTION OF MESH (Right)  Patient Location: PACU  Anesthesia Type:General  Level of Consciousness: awake, alert  and oriented  Airway and Oxygen Therapy: Patient Spontanous Breathing and Patient connected to nasal cannula oxygen  Post-op Pain: mild  Post-op Assessment: Post-op Vital signs reviewed, Patient's Cardiovascular Status Stable, Respiratory Function Stable, Patent Airway, No signs of Nausea or vomiting and Pain level controlled  Post-op Vital Signs: stable  Complications: No apparent anesthesia complications

## 2012-09-26 NOTE — Anesthesia Procedure Notes (Signed)
Procedure Name: LMA Insertion Date/Time: 09/26/2012 12:16 PM Performed by: Jerilee Hoh Pre-anesthesia Checklist: Patient identified, Emergency Drugs available, Suction available and Patient being monitored Patient Re-evaluated:Patient Re-evaluated prior to inductionOxygen Delivery Method: Circle system utilized Preoxygenation: Pre-oxygenation with 100% oxygen Intubation Type: IV induction Ventilation: Mask ventilation without difficulty LMA: LMA inserted LMA Size: 4.0 Number of attempts: 1 Placement Confirmation: positive ETCO2 and breath sounds checked- equal and bilateral Tube secured with: Tape Dental Injury: Teeth and Oropharynx as per pre-operative assessment

## 2012-09-26 NOTE — Interval H&P Note (Signed)
History and Physical Interval Note:  09/26/2012 12:02 PM  [name]  has presented today for surgery, with the diagnosis of right inguinal hernia  The various methods of treatment have been discussed with the patient and family. After consideration of risks, benefits and other options for treatment, the patient has consented to  Procedure(s): RIGHT INGUINAL HERNIA REPAIR WITH MESH (Right) INSERTION OF MESH (Right) as a surgical intervention .  The patient's history has been reviewed, patient examined, no change in status, stable for surgery.  I have reviewed the patient's chart and labs.  Questions were answered to the patient's satisfaction.     Manasa Spease Shela Commons

## 2012-09-27 ENCOUNTER — Telehealth (INDEPENDENT_AMBULATORY_CARE_PROVIDER_SITE_OTHER): Payer: Self-pay

## 2012-09-27 ENCOUNTER — Encounter (HOSPITAL_COMMUNITY): Payer: Self-pay | Admitting: General Surgery

## 2012-09-27 ENCOUNTER — Encounter (INDEPENDENT_AMBULATORY_CARE_PROVIDER_SITE_OTHER): Payer: Self-pay

## 2012-09-27 NOTE — Telephone Encounter (Signed)
Pt calling this morning states she "had a very bad night".  She took her Hydrocodone before bed and was up itching all night.  She cannot take NSAIDS, only Tylenol.  She was given Oxycodone last year after knee surgery with no ill side affects.  I suggested she start taking Tylenol for her pain and I would contact Dr. Abbey Chatters.

## 2012-09-27 NOTE — Telephone Encounter (Signed)
Pt's husband will p/u written Rx at front desk.

## 2012-09-27 NOTE — Telephone Encounter (Signed)
She can have OxyIR one by mouth every 4 hours as needed for pain. Dispense #30. No refill. Please see if somebody in the office can write that for her.

## 2012-10-03 ENCOUNTER — Encounter: Payer: Self-pay | Admitting: *Deleted

## 2012-10-04 ENCOUNTER — Ambulatory Visit: Payer: Medicare Other | Admitting: Internal Medicine

## 2012-10-10 ENCOUNTER — Ambulatory Visit (INDEPENDENT_AMBULATORY_CARE_PROVIDER_SITE_OTHER): Payer: Medicare Other | Admitting: General Surgery

## 2012-10-10 ENCOUNTER — Encounter (INDEPENDENT_AMBULATORY_CARE_PROVIDER_SITE_OTHER): Payer: Self-pay | Admitting: General Surgery

## 2012-10-10 VITALS — BP 120/60 | HR 64 | Temp 98.7°F | Resp 14 | Ht 69.0 in | Wt 140.8 lb

## 2012-10-10 DIAGNOSIS — Z9889 Other specified postprocedural states: Secondary | ICD-10-CM

## 2012-10-10 NOTE — Patient Instructions (Signed)
You have a moderate amount of swelling. This can take 6-12 weeks to get better. Continue light activities.

## 2012-10-10 NOTE — Progress Notes (Signed)
Procedure: Right inguinal hernia repair with mesh  Date:  09/26/2012  Pathology: na  History:She is here for her first postoperative visit. She had a lot of discomfort initially but that is improving. She complains of swelling and a little bulging in her right groin area.  She had significant swelling in her vaginal and groin area but this is improving.  Exam: General- Is in NAD. Right groin-incision is firm was moderate amount of swelling.  Inferior and lateral to this is an area of swelling was a slightly mobile but does not vary with the cough. It feels like a seroma or potentially a lipoma.  Assessment:  Status post right inguinal hernia para-pain is getting better. Swelling is improving. May have a seroma or a lipoma that is residual from the procedure.  She is quite anxious about this and I tried to reassure her that we need to give the swelling sometime to go away.  Plan:  Continue light activities. Return visit one month.

## 2012-10-13 ENCOUNTER — Other Ambulatory Visit: Payer: Self-pay

## 2012-10-13 ENCOUNTER — Encounter: Payer: Self-pay | Admitting: Cardiology

## 2012-10-13 ENCOUNTER — Ambulatory Visit (INDEPENDENT_AMBULATORY_CARE_PROVIDER_SITE_OTHER): Payer: Medicare Other | Admitting: Cardiology

## 2012-10-13 VITALS — BP 138/80 | HR 54 | Ht 69.0 in | Wt 141.4 lb

## 2012-10-13 DIAGNOSIS — I5022 Chronic systolic (congestive) heart failure: Secondary | ICD-10-CM

## 2012-10-13 DIAGNOSIS — F419 Anxiety disorder, unspecified: Secondary | ICD-10-CM

## 2012-10-13 DIAGNOSIS — F411 Generalized anxiety disorder: Secondary | ICD-10-CM

## 2012-10-13 DIAGNOSIS — Z95810 Presence of automatic (implantable) cardiac defibrillator: Secondary | ICD-10-CM

## 2012-10-13 NOTE — Progress Notes (Signed)
[name] Date of Birth: 1933-01-08 Medical Record #[id number]  History of Present Illness: [name] is seen today for a followup visit. She has a history of a dilated CM. EF initially down to 20%.  She had a dramatic response with Bi V therapy with improvement of ejection fraction to 50%. Her most recent echocardiogram showed an ejection fraction of 45-50%. Other issues include hypothyroidism, prior melanoma, gastritis, depression, anxiety, HLD  chronic LBBB, and PVCs..  Earlier this year she had her ICD revised to just a by the pacemaker. In July she had surgical repair of a right inguinal hernia. She is very upset today. She is depressed. She thinks that her surgery site is not healing well. Complaining of persistent swelling in this region and thinks the hernia still there. She denies any dyspnea or palpitations. She's had no increase in edema. She still complains about her hair falling out. She was given a prescription for citalopram by Dr. Chilton Si but she never took it. She was concerned about potential side effects.  Current Outpatient Prescriptions on File Prior to Visit  Medication Sig Dispense Refill  . acetaminophen (TYLENOL) 500 MG tablet Take 500 mg by mouth every 6 (six) hours as needed for pain. For pain      . Ascorbic Acid (VITAMIN C) 1000 MG tablet Take 1,000 mg by mouth daily.      . Biotin 1000 MCG tablet Take 5,000 mcg by mouth daily.       . Calcium Carbonate-Vitamin D (CALCIUM + D PO) Take 1 tablet by mouth daily. 1200mg  of Calcium and 1000mg  Vit D      . Coenzyme Q10 (CO Q-10 PO) Take 100 mg by mouth daily. Hold while in hospital      . Cyanocobalamin (VITAMIN B 12 PO) Take 1 tablet by mouth daily.       . digoxin (LANOXIN) 0.125 MG tablet Take 1 tablet (125 mcg total) by mouth daily.  30 tablet  5  . ESTRACE VAGINAL 0.1 MG/GM vaginal cream Place 1 g vaginally 2 (two) times a week.       . estrogen-methylTESTOSTERone (ESTRATEST HS) 0.625-1.25 MG per tablet Take 1  tablet by mouth daily.       . furosemide (LASIX) 20 MG tablet Take 20 mg by mouth daily as needed. For fluid retention      . levothyroxine (SYNTHROID, LEVOTHROID) 112 MCG tablet Take 112 mcg by mouth daily.        . Magnesium 250 MG TABS Take 1 tablet by mouth daily.       . metoprolol tartrate (LOPRESSOR) 25 MG tablet Take 12.5 mg by mouth daily.       . Multiple Vitamin (MULTIVITAMIN WITH MINERALS) TABS Take 1 tablet by mouth daily.      Marland Kitchen OVER THE COUNTER MEDICATION Place 1 drop into both eyes daily as needed. OTC Dry Eyes Eyedrop  For dry eyes      . potassium chloride SA (K-DUR,KLOR-CON) 20 MEQ tablet Take 20 mEq by mouth daily as needed. When taking the lasix      . RABEprazole (ACIPHEX) 20 MG tablet Take 1 tablet (20 mg total) by mouth daily.  90 tablet  0  . trandolapril (MAVIK) 2 MG tablet Take 1 tablet (2 mg total) by mouth 2 (two) times daily.  60 tablet  6  . [DISCONTINUED] omeprazole (PRILOSEC) 20 MG capsule Take 1 capsule (20 mg total) by mouth daily.  30 capsule  11  No current facility-administered medications on file prior to visit.    Allergies  Allergen Reactions  . Cephalexin Itching and Swelling  . Doxycycline Other (See Comments)    Per pt: unknown  . Ofloxacin Other (See Comments)    Per pt: unknown  . Tape Other (See Comments)    Burns skin.   . Latex Rash    Past Medical History  Diagnosis Date  . Nonischemic cardiomyopathy     EF initially 20%; Last measurement up to 50% per echo in August of 2012  . CHF (congestive heart failure)   . LBBB (left bundle branch block)   . Mitral regurgitation   . Melanoma     right arm  . Hypothyroidism   . Erosive gastritis 08/2004  . Hyperplastic polyps of stomach 06/2002  . Barrett's esophagus 04/2008  . GERD (gastroesophageal reflux disease)   . Diverticulitis   . Anxiety disorder   . HTN (hypertension)   . ICD (implantable cardiac defibrillator) in place     BiV/ICD; s/p removal of ICD with insertion of BiV  PPM 04/26/12  . Hair thinning   . Unspecified sinusitis (chronic)   . Unspecified chronic bronchitis   . Depressive disorder, not elsewhere classified   . Restless legs syndrome (RLS)   . Chronic airway obstruction, not elsewhere classified   . Benign neoplasm of colon   . Other and unspecified hyperlipidemia   . Unspecified nasal polyp   . Pain in joint, lower leg   . Synovial cyst of popliteal space   . Complication of anesthesia     slow to wake up  . Pacemaker 04/26/2012  . Sleep apnea   . Other and unspecified hyperlipidemia   . Dysthymic disorder   . Palpitations     Past Surgical History  Procedure Laterality Date  . Defibrillator insertion      s/p removal of previously implanted BiV ICD and insertion of a new BiV pacemaker on 04/26/12  . Left arm surgery      left  . Abdominal hysterectomy  1076    endometriosis  . Right breast lumpectomy  1988    Dr. Francina Ames  . Appendectomy    . Tonsillectomy    . Right knee surgery      arthroscopy: Dr. Simonne Come  . Excision of wen  1984    Dr. Valrie Hart  . Excise mole of lip  2001    Dr. Everlene Farrier  . Inguinal hernia repair Right 09/26/2012    Procedure: RIGHT INGUINAL HERNIA REPAIR WITH MESH;  Surgeon: Adolph Pollack, MD;  Location: [facility] OR;  Service: General;  Laterality: Right;  . Insertion of mesh Right 09/26/2012    Procedure: INSERTION OF MESH;  Surgeon: Adolph Pollack, MD;  Location: [facility] OR;  Service: General;  Laterality: Right;    History  Smoking status  . Former Smoker  . Types: Cigarettes  Smokeless tobacco  . Never Used    History  Alcohol Use No    Family History  Problem Relation Age of Onset  . Heart disease Other     maternal side  . Colon cancer Paternal Uncle   . Breast cancer Maternal Aunt   . Diabetes Neg Hx   . Stroke Father     Review of Systems: As noted in history of present illness. All other systems were reviewed and are negative.  Physical Exam: BP 138/80  Pulse 54  Ht 5'  9" (1.753 m)  Wt 141 lb  6.4 oz (64.139 kg)  BMI 20.87 kg/m2  SpO2 99% Patient is pleasant and in no acute distress. She is anxious and is crying. Skin is warm and dry. Color is normal.  HEENT is unremarkable. Normocephalic/atraumatic. PERRL. Sclera are nonicteric. Neck is supple. No masses. No JVD. Lungs are clear but with some scattered wheezes throughout. Cardiac exam shows a regular rate and rhythm. Her pacemaker site has healed well. Abdomen is soft. I looked at her surgical incision site in the right groin. This is healing well. There is some increased soft tissue swelling but no erythema. It is nontender. Extremities are without edema. Gait and ROM are intact. No gross neurologic deficits noted.  LABORATORY DATA:   Assessment / Plan: 1. Chronic congestive heart failure with nonischemic cardiomyopathy. Ejection fraction of 45-50%. She is on appropriate therapy with Lanoxin, metoprolol, and trandolapril. She uses Lasix as needed. We will continue her current therapy and followup again in 6 months.  2. Status post biventricular pacemaker.  3. PVCs.  4. Status post inguinal hernia repair. I think the swelling will resolve with time. I asked her to be patient.  5. Depression/anxiety. I have told her to go ahead and start taking the citalopram as prescribed.

## 2012-10-13 NOTE — Patient Instructions (Addendum)
Continue your current therapy  I will see you in 6 months with an Echocardiogram   

## 2012-10-18 ENCOUNTER — Ambulatory Visit: Payer: Medicare Other | Admitting: Internal Medicine

## 2012-10-26 ENCOUNTER — Telehealth: Payer: Self-pay | Admitting: Cardiology

## 2012-10-26 NOTE — Telephone Encounter (Signed)
Patient called no answer.LMTC. 

## 2012-10-26 NOTE — Telephone Encounter (Signed)
New prob  Pt would like to speak with you regarding one of her medications.

## 2012-10-27 MED ORDER — DIGOXIN 125 MCG PO TABS: 125.0000 ug | ORAL_TABLET | Freq: Every day | ORAL | Status: DC

## 2012-10-27 NOTE — Telephone Encounter (Signed)
F/up   Pt returning a call//

## 2012-10-27 NOTE — Telephone Encounter (Signed)
Returned call to patient she stated she needed refill on digoxin.Refill sent to pharmacy.

## 2012-11-01 ENCOUNTER — Ambulatory Visit (INDEPENDENT_AMBULATORY_CARE_PROVIDER_SITE_OTHER): Payer: Medicare Other | Admitting: Internal Medicine

## 2012-11-01 ENCOUNTER — Encounter: Payer: Self-pay | Admitting: Internal Medicine

## 2012-11-01 VITALS — BP 140/80 | HR 55 | Temp 96.9°F | Ht 68.0 in | Wt 139.0 lb

## 2012-11-01 DIAGNOSIS — L03019 Cellulitis of unspecified finger: Secondary | ICD-10-CM

## 2012-11-01 DIAGNOSIS — L659 Nonscarring hair loss, unspecified: Secondary | ICD-10-CM

## 2012-11-01 DIAGNOSIS — E039 Hypothyroidism, unspecified: Secondary | ICD-10-CM

## 2012-11-01 DIAGNOSIS — K4090 Unilateral inguinal hernia, without obstruction or gangrene, not specified as recurrent: Secondary | ICD-10-CM | POA: Diagnosis not present

## 2012-11-01 DIAGNOSIS — F3289 Other specified depressive episodes: Secondary | ICD-10-CM

## 2012-11-01 DIAGNOSIS — L03011 Cellulitis of right finger: Secondary | ICD-10-CM

## 2012-11-01 DIAGNOSIS — F329 Major depressive disorder, single episode, unspecified: Secondary | ICD-10-CM

## 2012-11-01 DIAGNOSIS — F411 Generalized anxiety disorder: Secondary | ICD-10-CM | POA: Diagnosis not present

## 2012-11-01 MED ORDER — CITALOPRAM HYDROBROMIDE 20 MG PO TABS: ORAL_TABLET | ORAL | Status: DC

## 2012-11-01 MED ORDER — MUPIROCIN 2 % EX OINT: TOPICAL_OINTMENT | CUTANEOUS | Status: DC

## 2012-11-01 NOTE — Progress Notes (Signed)
Subjective:    Patient ID: [name], female    DOB: 01/24/1933, 79 y.o.   MRN: [id number]  HPI Hair is falling out. She worries about iron or zinc deficiency.   She thinks the surgery for her herniorrhaphy, 09/26/12, was very difficult. Right inguinal area was swollen for a long time. Says she also had vaginal swelling. Dr. Abbey Chatters saw her 2 weeks after surgery. He will see her again on 11/14/12. Swelling has improved. She continues to have a bulge in the right groin.  She coughed blood for about a week after she had surgery. Still has a nocturnal cough, but not in the day.  Continues with feelings of weakness. "I feel like I am existing, but not really living."  Has lost 2# since April 2014.  Current Outpatient Prescriptions on File Prior to Visit  Medication Sig Dispense Refill  . acetaminophen (TYLENOL) 500 MG tablet Take 500 mg by mouth every 6 (six) hours as needed for pain. For pain      . Ascorbic Acid (VITAMIN C) 1000 MG tablet Take 1,000 mg by mouth daily.      . Biotin 1000 MCG tablet Take 5,000 mcg by mouth daily.       . Calcium Carbonate-Vitamin D (CALCIUM + D PO) Take 1 tablet by mouth daily. 1200mg  of Calcium and 1000mg  Vit D      . Cyanocobalamin (VITAMIN B 12 PO) Take 1 tablet by mouth daily.       . digoxin (LANOXIN) 0.125 MG tablet Take 1 tablet (125 mcg total) by mouth daily.  90 tablet  3  . ESTRACE VAGINAL 0.1 MG/GM vaginal cream Place 1 g vaginally 2 (two) times a week.       . estrogen-methylTESTOSTERone (ESTRATEST HS) 0.625-1.25 MG per tablet Take 1 tablet by mouth daily.       . furosemide (LASIX) 20 MG tablet Take 20 mg by mouth daily as needed. For fluid retention      . levothyroxine (SYNTHROID, LEVOTHROID) 112 MCG tablet Take 112 mcg by mouth daily.        . Magnesium 250 MG TABS Take 1 tablet by mouth daily.       . metoprolol tartrate (LOPRESSOR) 25 MG tablet Take 12.5 mg by mouth daily.       . Multiple Vitamin (MULTIVITAMIN WITH MINERALS) TABS Take  1 tablet by mouth daily.      Marland Kitchen OVER THE COUNTER MEDICATION Place 1 drop into both eyes daily as needed. OTC Dry Eyes Eyedrop  For dry eyes      . potassium chloride SA (K-DUR,KLOR-CON) 20 MEQ tablet Take 20 mEq by mouth daily as needed. When taking the lasix      . RABEprazole (ACIPHEX) 20 MG tablet Take 1 tablet (20 mg total) by mouth daily.  90 tablet  0  . trandolapril (MAVIK) 2 MG tablet Take 1 tablet (2 mg total) by mouth 2 (two) times daily.  60 tablet  6  . [DISCONTINUED] omeprazole (PRILOSEC) 20 MG capsule Take 1 capsule (20 mg total) by mouth daily.  30 capsule  11   No current facility-administered medications on file prior to visit.    Review of Systems  Constitutional: Positive for appetite change, fatigue and unexpected weight change. Negative for fever and chills.  HENT: Positive for hearing loss, congestion, rhinorrhea, neck pain, neck stiffness, postnasal drip and sinus pressure. Negative for ear pain, nosebleeds, facial swelling, sneezing, tinnitus and ear discharge.   Eyes: Negative.  Respiratory: Positive for cough and shortness of breath. Negative for wheezing.   Cardiovascular: Positive for palpitations. Negative for chest pain and leg swelling.  Gastrointestinal: Negative for nausea, abdominal pain, diarrhea, blood in stool and abdominal distention.       Hx erosive gastritis, HH, Barrett's esophagus.  Endocrine: Negative.   Genitourinary:       Nocturia x 3. Vaginal discomfort. Uses hormone cream from GYN, Dr. Jennette Kettle  Musculoskeletal: Positive for back pain and arthralgias. Negative for myalgias, joint swelling and gait problem.  Skin: Negative for pallor, rash and wound.       S/P right herniorraphy. Right index finger has onycholysis distally and tenderness.  Neurological: Positive for dizziness, numbness and headaches. Negative for tremors, seizures, syncope and speech difficulty.  Hematological: Negative.   Psychiatric/Behavioral: Positive for dysphoric mood.  The patient is nervous/anxious.        Objective:BP 140/80  Pulse 55  Temp(Src) 96.9 F (36.1 C) (Oral)  Ht 5\' 8"  (1.727 m)  Wt 139 lb (63.05 kg)  BMI 21.14 kg/m2  SpO2 99%    Physical Exam  Constitutional:  Frail, thin body. Losing weight.  HENT:  Right Ear: External ear normal.  Left Ear: External ear normal.  Mouth/Throat: Oropharynx is clear and moist.  Nasal polyp  Eyes:  Corrective lensees  Neck: No JVD present. No tracheal deviation present. No thyromegaly present.  stiff  Cardiovascular: Normal rate and regular rhythm.  Exam reveals no gallop and no friction rub.   No murmur heard. AICD in left upper chest. Raynaud's disease.  Pulmonary/Chest: Effort normal. No respiratory distress. She has no wheezes. She has rales. She exhibits no tenderness.  Abdominal: Soft. Bowel sounds are normal. She exhibits no distension and no mass. There is no tenderness.  Right inguinal area haas scar from surgery. There remains a herniation with a reducible lump.  Musculoskeletal: Normal range of motion. She exhibits no edema.  Tender right knee.   Lymphadenopathy:    She has no cervical adenopathy.  Neurological:  Reduced vibratory sensation in both feet.  Skin: No rash noted. No erythema. No pallor.  Onycholysis of right index fingeer. Tender to palpation.  Psychiatric:  Anxious. Depressed affect. Low energy.      Clinical Support on 09/20/2012  Component Date Value Range Status  . DEVICE MODEL PM 09/20/2012 100543   Final-Edited  . PACEART TECH NOTES PM 09/20/2012 Interrogation only today for patient's c/o of stinging at the site.  Site is well healed without any reddness or edema.  Follow up as scheduled.   Final-Edited  . AL AMPLITUDE 09/20/2012 2.4   Final-Edited  . RV LEAD AMPLITUDE 09/20/2012 16.6   Final-Edited  . LV LEAD AMPLITUDE 09/20/2012 5.6   [facility] Outpatient Visit on 09/19/2012  Component Date Value Range Status  . WBC 09/19/2012 8.8  4.0 -  10.5 K/uL Final  . RBC 09/19/2012 4.74  3.87 - 5.11 MIL/uL Final  . Hemoglobin 09/19/2012 15.1* 12.0 - 15.0 g/dL Final  . HCT 21/30/8657 41.9  36.0 - 46.0 % Final  . MCV 09/19/2012 88.4  78.0 - 100.0 fL Final  . MCH 09/19/2012 31.9  26.0 - 34.0 pg Final  . MCHC 09/19/2012 36.0  30.0 - 36.0 g/dL Final  . RDW 84/69/6295 12.9  11.5 - 15.5 % Final  . Platelets 09/19/2012 194  150 - 400 K/uL Final  . Neutrophils Relative % 09/19/2012 60  43 - 77 % Final  . Neutro Abs 09/19/2012 5.3  1.7 - 7.7 K/uL Final  . Lymphocytes Relative 09/19/2012 24  12 - 46 % Final  . Lymphs Abs 09/19/2012 2.1  0.7 - 4.0 K/uL Final  . Monocytes Relative 09/19/2012 12  3 - 12 % Final  . Monocytes Absolute 09/19/2012 1.0  0.1 - 1.0 K/uL Final  . Eosinophils Relative 09/19/2012 4  0 - 5 % Final  . Eosinophils Absolute 09/19/2012 0.3  0.0 - 0.7 K/uL Final  . Basophils Relative 09/19/2012 0  0 - 1 % Final  . Basophils Absolute 09/19/2012 0.0  0.0 - 0.1 K/uL Final  . Sodium 09/19/2012 133* 135 - 145 mEq/L Final  . Potassium 09/19/2012 4.2  3.5 - 5.1 mEq/L Final  . Chloride 09/19/2012 98  96 - 112 mEq/L Final  . CO2 09/19/2012 26  19 - 32 mEq/L Final  . Glucose, Bld 09/19/2012 86  70 - 99 mg/dL Final  . BUN 16/12/9602 13  6 - 23 mg/dL Final  . Creatinine, Ser 09/19/2012 0.66  0.50 - 1.10 mg/dL Final  . Calcium 54/11/8117 9.4  8.4 - 10.5 mg/dL Final  . Total Protein 09/19/2012 6.8  6.0 - 8.3 g/dL Final  . Albumin 14/78/2956 3.9  3.5 - 5.2 g/dL Final  . AST 21/30/8657 23  0 - 37 U/L Final  . ALT 09/19/2012 17  0 - 35 U/L Final  . Alkaline Phosphatase 09/19/2012 92  39 - 117 U/L Final  . Total Bilirubin 09/19/2012 0.3  0.3 - 1.2 mg/dL Final  . GFR calc non Af Amer 09/19/2012 83* >90 mL/min Final  . GFR calc Af Amer 09/19/2012 >90  >90 mL/min Final   Comment:                                 The eGFR has been calculated                          using the CKD EPI equation.                          This calculation has  not been                          validated in all clinical                          situations.                          eGFR's persistently                          <90 mL/min signify                          possible Chronic Kidney Disease.  Marland Kitchen Prothrombin Time 09/19/2012 13.8  11.6 - 15.2 seconds Final  . INR 09/19/2012 1.08  0.00 - 1.49 Final       Assessment & Plan:  Hair thinning - Plan: Zinc, Iron, Ferritin  Hypothyroidism - Plan: TSH  Inguinal hernia without mention of obstruction or gangrene, unilateral or unspecified, (not specified as recurrent)-right: to see surgeon again  ANXIETY: chronic  Depressive disorder, not elsewhere classified: unchanged  Paronychia of  finger, right - Plan: mupirocin ointment (BACTROBAN) 2 %

## 2012-11-01 NOTE — Patient Instructions (Signed)
Try citalopram for anxiety and depression. See Dr. Abbey Chatters as planned.

## 2012-11-02 ENCOUNTER — Telehealth: Payer: Self-pay | Admitting: Gastroenterology

## 2012-11-02 MED ORDER — RABEPRAZOLE SODIUM 20 MG PO TBEC: 20.0000 mg | DELAYED_RELEASE_TABLET | Freq: Every day | ORAL | Status: DC

## 2012-11-02 NOTE — Telephone Encounter (Signed)
Prescription sent to patient's pharmacy and pt agreed to schedule an office visit on 11/21/12. Told patient to come to the visit for any further refills.

## 2012-11-03 LAB — FERRITIN: Ferritin: 40 ng/mL (ref 15–150)

## 2012-11-09 ENCOUNTER — Encounter: Payer: Self-pay | Admitting: Internal Medicine

## 2012-11-14 ENCOUNTER — Ambulatory Visit (INDEPENDENT_AMBULATORY_CARE_PROVIDER_SITE_OTHER): Payer: Medicare Other | Admitting: General Surgery

## 2012-11-14 ENCOUNTER — Encounter (INDEPENDENT_AMBULATORY_CARE_PROVIDER_SITE_OTHER): Payer: Self-pay | Admitting: General Surgery

## 2012-11-14 VITALS — BP 110/70 | HR 68 | Resp 14 | Ht 68.0 in | Wt 138.4 lb

## 2012-11-14 DIAGNOSIS — Z9889 Other specified postprocedural states: Secondary | ICD-10-CM

## 2012-11-14 NOTE — Patient Instructions (Signed)
Continue your usual activities as long as they not painful.  Call us back for any questions.

## 2012-11-14 NOTE — Progress Notes (Signed)
She presents for another postoperative visit. The vaginal swelling has improved. However the mass below the inguinal ligament and the right thigh area C&S when standing up is still there. This is the reason she came in the first time. She is having less discomfort around the incision and less incisional swelling. She is very upset about this.  She complained about some swelling and discoloration around the fingernail of her right index finger. She states Dr. Chilton Si put her on some antibiotic ointment for this.  Physical exam:  GU-the right inguinal incision is clean and intact with minimal swelling. In the standing position there is a soft tissue mass in the right proximal thigh inferior to the inguinal ligament that is mobile and nontender. In the lying position it is more difficult to palpate the mass.  Right hand-I do not appreciate any swelling or on the nail bed of the right index finger or erythema.  Data Reviewed:  I reviewed her pelvic CT scan reports and  some of the CT scan films back to October of 2004. There appears to be a small mass in the same area in the right proximal thigh with features consistent with lipoma. It is also present but slightly larger in 2006 and is larger on the CT scan in July of 2013. I do not see anything to suggest this is an obvious femoral hernia but it is a small possibility. I spoke with the radiologist about this and he felt was most consistent with lipoma.  Assess:  Status post right inguinal hernia with mesh. She did have an indirect hernia with an indirect hernia sac and some extraperitoneal fat coming out of a patulous internal ring.  At the time of surgery, I felt this was consistent with what was noted on her physical exam. Unfortunately, the lipomatous tissue inferior to the inguinal ligament was not recognized during the time of surgery and thus not removed. She is very upset by this because this is the reason that she came here. I apologized to her and  told her I understood why she was upset. I did explain to her that she indeed had an inguinal hernia but I did not appreciate the fact that this soft tissue mass which appears to be a lipoma was separate from the inguinal hernia at the time of operation.  I did explain to her that there could be a chance this was a femoral hernia although neither I nor the radiologist could appreciate an obvious femoral hernia on CT scan.  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  Plan:  I told her I would not suggest doing anything now about the soft tissue mass which appears to be a lipoma.  Options for the soft tissue mass would be to do nothing or to potentially remove that at some point in time. I offered her a second opinion as well. I told her to call if she would like to pursue any of the above options.

## 2012-11-21 ENCOUNTER — Ambulatory Visit (INDEPENDENT_AMBULATORY_CARE_PROVIDER_SITE_OTHER): Payer: Medicare Other | Admitting: Gastroenterology

## 2012-11-21 ENCOUNTER — Encounter: Payer: Self-pay | Admitting: Gastroenterology

## 2012-11-21 VITALS — BP 110/60 | HR 60 | Ht 68.0 in | Wt 139.5 lb

## 2012-11-21 DIAGNOSIS — K2270 Barrett's esophagus without dysplasia: Secondary | ICD-10-CM

## 2012-11-21 DIAGNOSIS — Z1211 Encounter for screening for malignant neoplasm of colon: Secondary | ICD-10-CM

## 2012-11-21 NOTE — Progress Notes (Signed)
History of Present Illness: This is a 79 year old female with GERD. Barrett's was noted on esophageal biopsies in 2010 but not noted on surveillance esophageal biopsies in 2013. Her reflux symptoms are well-controlled on AcipHex. She is interested in discussing colorectal cancer screening. Her last colonoscopy was in April 2004 that showed only diverticulosis and hyperplastic colon polyps. She states she's had Hemoccult cards annually for the past several years and they were all negative through Dr. Donnetta Hail office. She has no digestive complaints.   Current Medications, Allergies, Past Medical History, Past Surgical History, Family History and Social History were reviewed in [employer] record.  Physical Exam: General: Well developed , well nourished, no acute distress Head: Normocephalic and atraumatic Eyes:  sclerae anicteric, EOMI Ears: Normal auditory acuity Mouth: No deformity or lesions Lungs: Clear throughout to auscultation Heart: Regular rate and rhythm; no murmurs, rubs or bruits Abdomen: Soft, non tender and non distended. No masses, hepatosplenomegaly or hernias noted. Normal Bowel sounds Musculoskeletal: Symmetrical with no gross deformities  Pulses:  Normal pulses noted Extremities: No clubbing, cyanosis, edema or deformities noted Neurological: Alert oriented x 4, grossly nonfocal Psychological:  Alert and cooperative. Anxious.  Assessment and Recommendations:  1. Barrett's esophagus. Barrett's noted on biopsies in 2010 but not in 2013. Continue AcipHex daily and antireflux measures. No plans for future surveillance endoscopies due to her co-morbidities, age-she will be over 82 when her next surveillance EGD is due and Barrett's was not noted on her last endoscopy. Ongoing PPI medication refills with her PCP.  2. Colorectal cancer screening, average risk. She is not sure whether she wants to proceed with an elective colonoscopy. I think it is reasonable to  defer future screening colonoscopies due to her age, co-morbidities and negative stool Hemoccults. If she decides to proceed with screening colonoscopy before age 56 we will do so however beyond 38 we will not plan for a routine screening colonoscopy. She will contact us if she would like to proceed.

## 2012-11-21 NOTE — Patient Instructions (Addendum)
Have Dr. Chilton Si refill your Aciphex in the future.    It has been recommended to you by your physician that you have a(n) Colonoscopy completed. Per your request, we did not schedule the procedure(s) today. Please contact our office at [phone] should you decide to have the procedure completed.  Thank you for choosing me and [facility].  Venita Lick. Pleas Koch., MD., Clementeen Graham  cc: Murray Hodgkins, MD

## 2012-11-24 DIAGNOSIS — L608 Other nail disorders: Secondary | ICD-10-CM | POA: Diagnosis not present

## 2012-11-24 DIAGNOSIS — Z85820 Personal history of malignant melanoma of skin: Secondary | ICD-10-CM | POA: Diagnosis not present

## 2012-12-06 ENCOUNTER — Ambulatory Visit (INDEPENDENT_AMBULATORY_CARE_PROVIDER_SITE_OTHER): Payer: Medicare Other | Admitting: Internal Medicine

## 2012-12-06 ENCOUNTER — Encounter: Payer: Self-pay | Admitting: Internal Medicine

## 2012-12-06 VITALS — BP 128/76 | HR 65 | Ht 67.5 in | Wt 139.0 lb

## 2012-12-06 DIAGNOSIS — K4090 Unilateral inguinal hernia, without obstruction or gangrene, not specified as recurrent: Secondary | ICD-10-CM | POA: Diagnosis not present

## 2012-12-06 NOTE — Patient Instructions (Signed)
Continue current medication.

## 2012-12-06 NOTE — Progress Notes (Signed)
Subjective:    Patient ID: [name], female    DOB: 1932-07-24, 79 y.o.   MRN: [id number]  HPI Dr. Russella Dar told her she did not need another coloscopy due to age but the discharge papers from his office said he recommended colonoscopy. He wants me to take on her rabeprazole 20 mg qd.  Right index fingernail has a white area of onycholysis that he thinks is due to fungus. Recommended thymol crystals in 4% EtOH. Patient is using the compounded material 3 times daily.  Recent tickle in her throat. Felt like she had a flu like illness last weekend.  She remains quite distressed about the persistent lump in the right groin she has seen Dr. Abbey Chatters, general surgeon who did her operatioin and now feels she may have a lipoma in the area. This area is slightly tender. She also complains of intravaginal discomfort.  She would like another opinion in regards to the lump in her groin.  Current Outpatient Prescriptions on File Prior to Visit  Medication Sig Dispense Refill  . acetaminophen (TYLENOL) 500 MG tablet Take 500 mg by mouth every 6 (six) hours as needed for pain. For pain      . Ascorbic Acid (VITAMIN C) 1000 MG tablet Take 1,000 mg by mouth daily.      . Biotin 1000 MCG tablet Take 5,000 mcg by mouth daily.       . Calcium Carbonate-Vitamin D (CALCIUM + D PO) Take 1 tablet by mouth daily. 1200mg  of Calcium and 1000mg  Vit D      . citalopram (CELEXA) 20 MG tablet       . Cyanocobalamin (VITAMIN B 12 PO) Take 1 tablet by mouth daily.       . digoxin (LANOXIN) 0.125 MG tablet Take 1 tablet (125 mcg total) by mouth daily.  90 tablet  3  . ESTRACE VAGINAL 0.1 MG/GM vaginal cream Place 1 g vaginally 2 (two) times a week.       . estrogen-methylTESTOSTERone (ESTRATEST HS) 0.625-1.25 MG per tablet Take 1 tablet by mouth daily.       . furosemide (LASIX) 20 MG tablet Take 20 mg by mouth daily as needed. For fluid retention      . levothyroxine (SYNTHROID, LEVOTHROID) 112 MCG tablet Take 112 mcg  by mouth daily.        . Magnesium 250 MG TABS Take 1 tablet by mouth daily.       . metoprolol tartrate (LOPRESSOR) 25 MG tablet Take 12.5 mg by mouth daily.       . Multiple Vitamin (MULTIVITAMIN WITH MINERALS) TABS Take 1 tablet by mouth daily.      . mupirocin ointment (BACTROBAN) 2 % Apply to tender finger twice daily  22 g  3  . OVER THE COUNTER MEDICATION Place 1 drop into both eyes daily as needed. OTC Dry Eyes Eyedrop  For dry eyes      . potassium chloride SA (K-DUR,KLOR-CON) 20 MEQ tablet Take 20 mEq by mouth daily as needed. When taking the lasix      . RABEprazole (ACIPHEX) 20 MG tablet Take 1 tablet (20 mg total) by mouth daily.  90 tablet  0  . trandolapril (MAVIK) 2 MG tablet Take 1 tablet (2 mg total) by mouth 2 (two) times daily.  60 tablet  6  . [DISCONTINUED] omeprazole (PRILOSEC) 20 MG capsule Take 1 capsule (20 mg total) by mouth daily.  30 capsule  11   No current facility-administered  medications on file prior to visit.    Review of Systems  Constitutional: Positive for appetite change, fatigue and unexpected weight change. Negative for fever and chills.  HENT: Positive for hearing loss, congestion, rhinorrhea, neck pain, neck stiffness, postnasal drip and sinus pressure. Negative for ear pain, nosebleeds, facial swelling, sneezing, tinnitus and ear discharge.   Eyes: Negative.   Respiratory: Positive for cough and shortness of breath. Negative for wheezing.   Cardiovascular: Positive for palpitations. Negative for chest pain and leg swelling.  Gastrointestinal: Negative for nausea, abdominal pain, diarrhea, blood in stool and abdominal distention.       Hx erosive gastritis, HH, Barrett's esophagus. Persistent, uncomfortable lump in her right groin.  Endocrine: Negative.   Genitourinary:       Nocturia x 3. Vaginal discomfort. Uses hormone cream from GYN, Dr. Jennette Kettle  Musculoskeletal: Positive for back pain and arthralgias. Negative for myalgias, joint swelling and  gait problem.  Skin: Negative for pallor, rash and wound.       S/P right herniorraphy. Right index finger has onycholysis distally and tenderness.  Neurological: Positive for dizziness, numbness and headaches. Negative for tremors, seizures, syncope and speech difficulty.  Hematological: Negative.   Psychiatric/Behavioral: Positive for dysphoric mood. The patient is nervous/anxious.        Objective:BP 128/76  Pulse 65  Ht 5' 7.5" (1.715 m)  Wt 139 lb (63.05 kg)  BMI 21.44 kg/m2  SpO2 99%    Physical Exam  Constitutional:  Frail, thin body. Losing weight.  HENT:  Right Ear: External ear normal.  Left Ear: External ear normal.  Mouth/Throat: Oropharynx is clear and moist.  Nasal polyp  Eyes:  Corrective lensees  Neck: No JVD present. No tracheal deviation present. No thyromegaly present.  stiff  Cardiovascular: Normal rate and regular rhythm.  Exam reveals no gallop and no friction rub.   No murmur heard. AICD in left upper chest. Raynaud's disease.  Pulmonary/Chest: Effort normal. No respiratory distress. She has no wheezes. She has rales. She exhibits no tenderness.  Abdominal: Soft. Bowel sounds are normal. She exhibits no distension and no mass. There is no tenderness.  Right inguinal area has scar from surgery. There remains a lump that is somewhat tender.  Musculoskeletal: Normal range of motion. She exhibits no edema.  Tender right knee.   Lymphadenopathy:    She has no cervical adenopathy.  Neurological:  Reduced vibratory sensation in both feet.  Skin: No rash noted. No erythema. No pallor.  Onycholysis of right index fingeer. Tender to palpation.  Psychiatric:  Anxious. Depressed affect. Low energy.     Office Visit on 11/01/2012  Component Date Value Range Status  . Zinc 11/01/2012 61  56 - 134 ug/dL Final                                   Detection Limit = 5  . Iron 11/01/2012 95  35 - 155 ug/dL Final  . Ferritin 47/82/9562 40  15 - 150 ng/mL Final   Clinical Support on 09/20/2012  Component Date Value Range Status  . DEVICE MODEL PM 09/20/2012 100543   Final-Edited  . PACEART TECH NOTES PM 09/20/2012 Interrogation only today for patient's c/o of stinging at the site.  Site is well healed without any reddness or edema.  Follow up as scheduled.   Final-Edited  . AL AMPLITUDE 09/20/2012 2.4   Final-Edited  . RV LEAD AMPLITUDE  09/20/2012 16.6   Final-Edited  . LV LEAD AMPLITUDE 09/20/2012 5.6   [facility] Outpatient Visit on 09/19/2012  Component Date Value Range Status  . WBC 09/19/2012 8.8  4.0 - 10.5 K/uL Final  . RBC 09/19/2012 4.74  3.87 - 5.11 MIL/uL Final  . Hemoglobin 09/19/2012 15.1* 12.0 - 15.0 g/dL Final  . HCT 16/12/9602 41.9  36.0 - 46.0 % Final  . MCV 09/19/2012 88.4  78.0 - 100.0 fL Final  . MCH 09/19/2012 31.9  26.0 - 34.0 pg Final  . MCHC 09/19/2012 36.0  30.0 - 36.0 g/dL Final  . RDW 54/11/8117 12.9  11.5 - 15.5 % Final  . Platelets 09/19/2012 194  150 - 400 K/uL Final  . Neutrophils Relative % 09/19/2012 60  43 - 77 % Final  . Neutro Abs 09/19/2012 5.3  1.7 - 7.7 K/uL Final  . Lymphocytes Relative 09/19/2012 24  12 - 46 % Final  . Lymphs Abs 09/19/2012 2.1  0.7 - 4.0 K/uL Final  . Monocytes Relative 09/19/2012 12  3 - 12 % Final  . Monocytes Absolute 09/19/2012 1.0  0.1 - 1.0 K/uL Final  . Eosinophils Relative 09/19/2012 4  0 - 5 % Final  . Eosinophils Absolute 09/19/2012 0.3  0.0 - 0.7 K/uL Final  . Basophils Relative 09/19/2012 0  0 - 1 % Final  . Basophils Absolute 09/19/2012 0.0  0.0 - 0.1 K/uL Final  . Sodium 09/19/2012 133* 135 - 145 mEq/L Final  . Potassium 09/19/2012 4.2  3.5 - 5.1 mEq/L Final  . Chloride 09/19/2012 98  96 - 112 mEq/L Final  . CO2 09/19/2012 26  19 - 32 mEq/L Final  . Glucose, Bld 09/19/2012 86  70 - 99 mg/dL Final  . BUN 14/78/2956 13  6 - 23 mg/dL Final  . Creatinine, Ser 09/19/2012 0.66  0.50 - 1.10 mg/dL Final  . Calcium 21/30/8657 9.4  8.4 - 10.5 mg/dL Final  . Total  Protein 09/19/2012 6.8  6.0 - 8.3 g/dL Final  . Albumin 84/69/6295 3.9  3.5 - 5.2 g/dL Final  . AST 28/41/3244 23  0 - 37 U/L Final  . ALT 09/19/2012 17  0 - 35 U/L Final  . Alkaline Phosphatase 09/19/2012 92  39 - 117 U/L Final  . Total Bilirubin 09/19/2012 0.3  0.3 - 1.2 mg/dL Final  . GFR calc non Af Amer 09/19/2012 83* >90 mL/min Final  . GFR calc Af Amer 09/19/2012 >90  >90 mL/min Final   Comment:                                 The eGFR has been calculated                          using the CKD EPI equation.                          This calculation has not been                          validated in all clinical                          situations.  eGFR's persistently                          <90 mL/min signify                          possible Chronic Kidney Disease.  Marland Kitchen Prothrombin Time 09/19/2012 13.8  11.6 - 15.2 seconds Final  . INR 09/19/2012 1.08  0.00 - 1.49 Final        Assessment & Plan:  Inguinal hernia without mention of obstruction or gangrene, unilateral or unspecified, (not specified as recurrent)-right: Advised patient that we could continue to watch this area were get her another surgical opinion or do x-rays.   - Plan: CT Abdomen Pelvis W Contrast. Pending the results of these tests, we may need to schedule a surgical opinion.

## 2012-12-07 ENCOUNTER — Other Ambulatory Visit: Payer: Self-pay | Admitting: *Deleted

## 2012-12-07 ENCOUNTER — Other Ambulatory Visit: Payer: Medicare Other

## 2012-12-07 DIAGNOSIS — E039 Hypothyroidism, unspecified: Secondary | ICD-10-CM | POA: Diagnosis not present

## 2012-12-07 DIAGNOSIS — I10 Essential (primary) hypertension: Secondary | ICD-10-CM | POA: Diagnosis not present

## 2012-12-07 DIAGNOSIS — K4090 Unilateral inguinal hernia, without obstruction or gangrene, not specified as recurrent: Secondary | ICD-10-CM

## 2012-12-08 LAB — COMPREHENSIVE METABOLIC PANEL
ALT: 16 IU/L (ref 0–32)
Albumin/Globulin Ratio: 2.2 (ref 1.1–2.5)
Albumin: 4 g/dL (ref 3.5–4.8)
BUN: 17 mg/dL (ref 8–27)
CO2: 24 mmol/L (ref 18–29)
Calcium: 9 mg/dL (ref 8.6–10.2)
Creatinine, Ser: 0.67 mg/dL (ref 0.57–1.00)
GFR calc Af Amer: 97 mL/min/{1.73_m2} (ref >59)
GFR calc non Af Amer: 84 mL/min/{1.73_m2} (ref >59)
Globulin, Total: 1.8 g/dL (ref 1.5–4.5)
Glucose: 90 mg/dL (ref 65–99)
Potassium: 4.5 mmol/L (ref 3.5–5.2)
Total Protein: 5.8 g/dL — ABNORMAL LOW (ref 6.0–8.5)

## 2012-12-08 LAB — TSH: TSH: 1.28 u[IU]/mL (ref 0.450–4.500)

## 2012-12-11 ENCOUNTER — Ambulatory Visit (HOSPITAL_COMMUNITY)
Admission: RE | Admit: 2012-12-11 | Discharge: 2012-12-11 | Disposition: A | Discharge status: Home / Self Care | Payer: Medicare Other | Source: Ambulatory Visit | Attending: Internal Medicine | Admitting: Internal Medicine

## 2012-12-11 ENCOUNTER — Other Ambulatory Visit: Payer: Medicare Other

## 2012-12-11 DIAGNOSIS — M5137 Other intervertebral disc degeneration, lumbosacral region: Secondary | ICD-10-CM | POA: Insufficient documentation

## 2012-12-11 DIAGNOSIS — M51379 Other intervertebral disc degeneration, lumbosacral region without mention of lumbar back pain or lower extremity pain: Secondary | ICD-10-CM | POA: Insufficient documentation

## 2012-12-11 DIAGNOSIS — N281 Cyst of kidney, acquired: Secondary | ICD-10-CM | POA: Diagnosis not present

## 2012-12-11 DIAGNOSIS — K4091 Unilateral inguinal hernia, without obstruction or gangrene, recurrent: Secondary | ICD-10-CM | POA: Insufficient documentation

## 2012-12-11 DIAGNOSIS — K4090 Unilateral inguinal hernia, without obstruction or gangrene, not specified as recurrent: Secondary | ICD-10-CM | POA: Diagnosis not present

## 2012-12-11 MED ORDER — IOHEXOL 300 MG/ML  SOLN
50.0000 mL | Freq: Once | INTRAMUSCULAR | Status: AC | PRN
  Administered 2012-12-11: 50 mL via INTRAVENOUS

## 2012-12-11 MED ORDER — IOHEXOL 300 MG/ML  SOLN
100.0000 mL | Freq: Once | INTRAMUSCULAR | Status: AC | PRN
  Administered 2012-12-11: 100 mL via INTRAVENOUS

## 2012-12-14 ENCOUNTER — Encounter (INDEPENDENT_AMBULATORY_CARE_PROVIDER_SITE_OTHER): Payer: Self-pay | Admitting: General Surgery

## 2012-12-14 DIAGNOSIS — K1120 Sialoadenitis, unspecified: Secondary | ICD-10-CM | POA: Diagnosis not present

## 2012-12-14 NOTE — Progress Notes (Signed)
Patient ID: [name], female   DOB: [birth year], 79 y.o.   MRN: [id number] I received a note from Dr. Chilton Si about her and spoke with him.  She had a recent CT scan that was read as a recurrent right inguinal hernia.  I reviewed the CT with the radiologist who read it and we agreed that it could be a femoral hernia or a lipoma near the femoral vessels.  The radiologist was able to see this on a 2008 CT and at that time he felt it looked more like a lipoma.  [name] is still upset that the lump is still there.  I offered to do whatever I could to help her with the situation.  I offered to arrange for one of the other surgeons in the practice to see her for another surgical opinion.  I asked Dr. Chilton Si for some names he would consider referring her to and these included Dr. Derrell Lolling, Dr. Johna Sheriff, or Dr. Ezzard Standing.  She wants to think about this.  I have asked her to let me know if I can help arrange for the appointment if she chooses to have the second opinion.

## 2012-12-20 ENCOUNTER — Encounter (HOSPITAL_COMMUNITY): Payer: Self-pay | Admitting: Emergency Medicine

## 2012-12-20 ENCOUNTER — Emergency Department (HOSPITAL_COMMUNITY): Payer: Medicare Other

## 2012-12-20 ENCOUNTER — Emergency Department (HOSPITAL_COMMUNITY)
Admission: EM | Admit: 2012-12-20 | Discharge: 2012-12-21 | Discharge status: Against Medical Advice | Payer: Medicare Other | Source: Home / Self Care | Attending: Emergency Medicine | Admitting: Emergency Medicine

## 2012-12-20 DIAGNOSIS — E876 Hypokalemia: Secondary | ICD-10-CM | POA: Diagnosis not present

## 2012-12-20 DIAGNOSIS — Z452 Encounter for adjustment and management of vascular access device: Secondary | ICD-10-CM | POA: Diagnosis not present

## 2012-12-20 DIAGNOSIS — K4131 Unilateral femoral hernia, with obstruction, without gangrene, recurrent: Secondary | ICD-10-CM | POA: Diagnosis not present

## 2012-12-20 DIAGNOSIS — K219 Gastro-esophageal reflux disease without esophagitis: Secondary | ICD-10-CM | POA: Insufficient documentation

## 2012-12-20 DIAGNOSIS — R109 Unspecified abdominal pain: Secondary | ICD-10-CM | POA: Diagnosis not present

## 2012-12-20 DIAGNOSIS — E785 Hyperlipidemia, unspecified: Secondary | ICD-10-CM | POA: Diagnosis present

## 2012-12-20 DIAGNOSIS — E440 Moderate protein-calorie malnutrition: Secondary | ICD-10-CM | POA: Diagnosis not present

## 2012-12-20 DIAGNOSIS — Z860100 Personal history of colon polyps, unspecified: Secondary | ICD-10-CM | POA: Insufficient documentation

## 2012-12-20 DIAGNOSIS — F329 Major depressive disorder, single episode, unspecified: Secondary | ICD-10-CM | POA: Insufficient documentation

## 2012-12-20 DIAGNOSIS — Z5189 Encounter for other specified aftercare: Secondary | ICD-10-CM | POA: Diagnosis not present

## 2012-12-20 DIAGNOSIS — K560 Paralytic ileus: Secondary | ICD-10-CM | POA: Diagnosis not present

## 2012-12-20 DIAGNOSIS — Z85820 Personal history of malignant melanoma of skin: Secondary | ICD-10-CM | POA: Insufficient documentation

## 2012-12-20 DIAGNOSIS — Z95810 Presence of automatic (implantable) cardiac defibrillator: Secondary | ICD-10-CM | POA: Insufficient documentation

## 2012-12-20 DIAGNOSIS — R42 Dizziness and giddiness: Secondary | ICD-10-CM | POA: Insufficient documentation

## 2012-12-20 DIAGNOSIS — R9389 Abnormal findings on diagnostic imaging of other specified body structures: Secondary | ICD-10-CM | POA: Diagnosis not present

## 2012-12-20 DIAGNOSIS — Z79899 Other long term (current) drug therapy: Secondary | ICD-10-CM | POA: Insufficient documentation

## 2012-12-20 DIAGNOSIS — K5730 Diverticulosis of large intestine without perforation or abscess without bleeding: Secondary | ICD-10-CM | POA: Diagnosis not present

## 2012-12-20 DIAGNOSIS — K4031 Unilateral inguinal hernia, with obstruction, without gangrene, recurrent: Secondary | ICD-10-CM | POA: Diagnosis present

## 2012-12-20 DIAGNOSIS — I5022 Chronic systolic (congestive) heart failure: Secondary | ICD-10-CM | POA: Diagnosis not present

## 2012-12-20 DIAGNOSIS — I059 Rheumatic mitral valve disease, unspecified: Secondary | ICD-10-CM | POA: Diagnosis present

## 2012-12-20 DIAGNOSIS — R935 Abnormal findings on diagnostic imaging of other abdominal regions, including retroperitoneum: Secondary | ICD-10-CM | POA: Diagnosis not present

## 2012-12-20 DIAGNOSIS — D72829 Elevated white blood cell count, unspecified: Secondary | ICD-10-CM

## 2012-12-20 DIAGNOSIS — I447 Left bundle-branch block, unspecified: Secondary | ICD-10-CM | POA: Diagnosis present

## 2012-12-20 DIAGNOSIS — K5669 Other intestinal obstruction: Secondary | ICD-10-CM | POA: Diagnosis not present

## 2012-12-20 DIAGNOSIS — R51 Headache: Secondary | ICD-10-CM | POA: Insufficient documentation

## 2012-12-20 DIAGNOSIS — Z862 Personal history of diseases of the blood and blood-forming organs and certain disorders involving the immune mechanism: Secondary | ICD-10-CM | POA: Insufficient documentation

## 2012-12-20 DIAGNOSIS — Z8739 Personal history of other diseases of the musculoskeletal system and connective tissue: Secondary | ICD-10-CM | POA: Insufficient documentation

## 2012-12-20 DIAGNOSIS — J189 Pneumonia, unspecified organism: Secondary | ICD-10-CM | POA: Diagnosis not present

## 2012-12-20 DIAGNOSIS — Z87891 Personal history of nicotine dependence: Secondary | ICD-10-CM | POA: Insufficient documentation

## 2012-12-20 DIAGNOSIS — I509 Heart failure, unspecified: Secondary | ICD-10-CM | POA: Insufficient documentation

## 2012-12-20 DIAGNOSIS — R1032 Left lower quadrant pain: Secondary | ICD-10-CM | POA: Diagnosis not present

## 2012-12-20 DIAGNOSIS — Z9889 Other specified postprocedural states: Secondary | ICD-10-CM | POA: Insufficient documentation

## 2012-12-20 DIAGNOSIS — I4949 Other premature depolarization: Secondary | ICD-10-CM | POA: Diagnosis not present

## 2012-12-20 DIAGNOSIS — I10 Essential (primary) hypertension: Secondary | ICD-10-CM | POA: Insufficient documentation

## 2012-12-20 DIAGNOSIS — N179 Acute kidney failure, unspecified: Secondary | ICD-10-CM | POA: Diagnosis not present

## 2012-12-20 DIAGNOSIS — R142 Eructation: Secondary | ICD-10-CM | POA: Insufficient documentation

## 2012-12-20 DIAGNOSIS — E860 Dehydration: Secondary | ICD-10-CM | POA: Diagnosis present

## 2012-12-20 DIAGNOSIS — J4489 Other specified chronic obstructive pulmonary disease: Secondary | ICD-10-CM | POA: Insufficient documentation

## 2012-12-20 DIAGNOSIS — R141 Gas pain: Secondary | ICD-10-CM | POA: Insufficient documentation

## 2012-12-20 DIAGNOSIS — M6281 Muscle weakness (generalized): Secondary | ICD-10-CM | POA: Diagnosis not present

## 2012-12-20 DIAGNOSIS — K4190 Unilateral femoral hernia, without obstruction or gangrene, not specified as recurrent: Secondary | ICD-10-CM | POA: Diagnosis not present

## 2012-12-20 DIAGNOSIS — I959 Hypotension, unspecified: Secondary | ICD-10-CM | POA: Diagnosis not present

## 2012-12-20 DIAGNOSIS — Z9089 Acquired absence of other organs: Secondary | ICD-10-CM | POA: Insufficient documentation

## 2012-12-20 DIAGNOSIS — K929 Disease of digestive system, unspecified: Secondary | ICD-10-CM | POA: Diagnosis not present

## 2012-12-20 DIAGNOSIS — Z91040 Latex allergy status: Secondary | ICD-10-CM | POA: Insufficient documentation

## 2012-12-20 DIAGNOSIS — R188 Other ascites: Secondary | ICD-10-CM | POA: Diagnosis not present

## 2012-12-20 DIAGNOSIS — G2581 Restless legs syndrome: Secondary | ICD-10-CM | POA: Diagnosis present

## 2012-12-20 DIAGNOSIS — R112 Nausea with vomiting, unspecified: Secondary | ICD-10-CM | POA: Diagnosis not present

## 2012-12-20 DIAGNOSIS — Z8639 Personal history of other endocrine, nutritional and metabolic disease: Secondary | ICD-10-CM | POA: Insufficient documentation

## 2012-12-20 DIAGNOSIS — F411 Generalized anxiety disorder: Secondary | ICD-10-CM | POA: Insufficient documentation

## 2012-12-20 DIAGNOSIS — I428 Other cardiomyopathies: Secondary | ICD-10-CM | POA: Diagnosis present

## 2012-12-20 DIAGNOSIS — Z8669 Personal history of other diseases of the nervous system and sense organs: Secondary | ICD-10-CM | POA: Insufficient documentation

## 2012-12-20 DIAGNOSIS — K5649 Other impaction of intestine: Secondary | ICD-10-CM | POA: Diagnosis not present

## 2012-12-20 DIAGNOSIS — R1110 Vomiting, unspecified: Secondary | ICD-10-CM | POA: Insufficient documentation

## 2012-12-20 DIAGNOSIS — R933 Abnormal findings on diagnostic imaging of other parts of digestive tract: Secondary | ICD-10-CM | POA: Diagnosis not present

## 2012-12-20 DIAGNOSIS — R262 Difficulty in walking, not elsewhere classified: Secondary | ICD-10-CM | POA: Diagnosis not present

## 2012-12-20 DIAGNOSIS — Z5331 Laparoscopic surgical procedure converted to open procedure: Secondary | ICD-10-CM | POA: Diagnosis not present

## 2012-12-20 DIAGNOSIS — E039 Hypothyroidism, unspecified: Secondary | ICD-10-CM | POA: Insufficient documentation

## 2012-12-20 DIAGNOSIS — K56609 Unspecified intestinal obstruction, unspecified as to partial versus complete obstruction: Secondary | ICD-10-CM | POA: Diagnosis not present

## 2012-12-20 DIAGNOSIS — I4460 Unspecified fascicular block: Secondary | ICD-10-CM | POA: Insufficient documentation

## 2012-12-20 DIAGNOSIS — IMO0001 Reserved for inherently not codable concepts without codable children: Secondary | ICD-10-CM | POA: Diagnosis not present

## 2012-12-20 DIAGNOSIS — Z8719 Personal history of other diseases of the digestive system: Secondary | ICD-10-CM | POA: Diagnosis not present

## 2012-12-20 DIAGNOSIS — IMO0002 Reserved for concepts with insufficient information to code with codable children: Secondary | ICD-10-CM | POA: Diagnosis not present

## 2012-12-20 DIAGNOSIS — R6883 Chills (without fever): Secondary | ICD-10-CM | POA: Insufficient documentation

## 2012-12-20 DIAGNOSIS — I472 Ventricular tachycardia: Secondary | ICD-10-CM | POA: Diagnosis not present

## 2012-12-20 DIAGNOSIS — Z90710 Acquired absence of both cervix and uterus: Secondary | ICD-10-CM | POA: Insufficient documentation

## 2012-12-20 DIAGNOSIS — K559 Vascular disorder of intestine, unspecified: Secondary | ICD-10-CM | POA: Diagnosis not present

## 2012-12-20 DIAGNOSIS — E46 Unspecified protein-calorie malnutrition: Secondary | ICD-10-CM | POA: Diagnosis not present

## 2012-12-20 DIAGNOSIS — K4140 Unilateral femoral hernia, with gangrene, not specified as recurrent: Secondary | ICD-10-CM | POA: Diagnosis not present

## 2012-12-20 DIAGNOSIS — K2270 Barrett's esophagus without dysplasia: Secondary | ICD-10-CM | POA: Diagnosis not present

## 2012-12-20 DIAGNOSIS — F3289 Other specified depressive episodes: Secondary | ICD-10-CM | POA: Insufficient documentation

## 2012-12-20 DIAGNOSIS — J449 Chronic obstructive pulmonary disease, unspecified: Secondary | ICD-10-CM | POA: Insufficient documentation

## 2012-12-20 LAB — CBC WITH DIFFERENTIAL/PLATELET
Basophils Absolute: 0 10*3/uL (ref 0.0–0.1)
Basophils Relative: 0 % (ref 0–1)
HCT: 43 % (ref 36.0–46.0)
Hemoglobin: 15.3 g/dL — ABNORMAL HIGH (ref 12.0–15.0)
Lymphocytes Relative: 6 % — ABNORMAL LOW (ref 12–46)
MCH: 31 pg (ref 26.0–34.0)
MCHC: 35.6 g/dL (ref 30.0–36.0)
Monocytes Absolute: 1.3 10*3/uL — ABNORMAL HIGH (ref 0.1–1.0)
Neutro Abs: 12.7 10*3/uL — ABNORMAL HIGH (ref 1.7–7.7)
Neutrophils Relative %: 85 % — ABNORMAL HIGH (ref 43–77)
RDW: 12.7 % (ref 11.5–15.5)
WBC: 15 10*3/uL — ABNORMAL HIGH (ref 4.0–10.5)

## 2012-12-20 LAB — POCT I-STAT, CHEM 8
BUN: 15 mg/dL (ref 6–23)
Calcium, Ion: 1.1 mmol/L — ABNORMAL LOW (ref 1.13–1.30)
Chloride: 97 mEq/L (ref 96–112)
HCT: 48 % — ABNORMAL HIGH (ref 36.0–46.0)
Hemoglobin: 16.3 g/dL — ABNORMAL HIGH (ref 12.0–15.0)
Potassium: 3.9 mEq/L (ref 3.5–5.1)
Sodium: 131 mEq/L — ABNORMAL LOW (ref 135–145)

## 2012-12-20 LAB — CG4 I-STAT (LACTIC ACID): Lactic Acid, Venous: 1.25 mmol/L (ref 0.5–2.2)

## 2012-12-20 MED ORDER — MORPHINE SULFATE 4 MG/ML IJ SOLN
4.0000 mg | Freq: Once | INTRAMUSCULAR | Status: AC
  Administered 2012-12-21: 4 mg via INTRAVENOUS
  Filled 2012-12-20: qty 1

## 2012-12-20 MED ORDER — SODIUM CHLORIDE 0.9 % IV BOLUS (SEPSIS)
500.0000 mL | Freq: Once | INTRAVENOUS | Status: AC
  Administered 2012-12-21: 500 mL via INTRAVENOUS

## 2012-12-20 MED ORDER — ONDANSETRON HCL 4 MG/2ML IJ SOLN
4.0000 mg | Freq: Once | INTRAMUSCULAR | Status: AC
  Administered 2012-12-21: 4 mg via INTRAVENOUS
  Filled 2012-12-20: qty 2

## 2012-12-20 NOTE — Progress Notes (Signed)
Patient refuses EKG and occult blood. The necessity of the test have been explained and patient verbalizes understanding but still refuses.

## 2012-12-20 NOTE — ED Notes (Signed)
Pt asked for a second time if she would like [place] to complete the EKG and urinalysis, pt still refused.

## 2012-12-20 NOTE — ED Notes (Signed)
Pt returned from radiology.

## 2012-12-20 NOTE — ED Notes (Signed)
Pt complains of lower abd pain after eating a hamburger last night, she vomited several times today and has had diarrhea

## 2012-12-20 NOTE — ED Provider Notes (Signed)
CSN: [id number]     Arrival date & time 12/20/12  2051 History   First MD Initiated Contact with Patient 12/20/12 2119     Chief Complaint  Patient presents with  . Abdominal Pain   (Consider location/radiation/quality/duration/timing/severity/associated sxs/prior Treatment) HPI  79 year old female with hx of CHF, erosis gastritis, anxiety, benign neoplasm of colon and Barrett's esophagus presents c/o lower abd pain.  She states that this abdominal pain began last night at 11 pm several hours after she ate a hamburger at [employer]. She is concerned the pain may be due to food poisoning. The pain is dull and aching and mostly located in the lower part of the abdomen. She occasionally has pain in the upper part of her abdomen. The pain has been constant; she was unable to sleep last night. She has had 7-8 episodes of vomiting today. She is mostly vomiting water mixed with some dark liquid. She has not been able to keep solids down, but she has been keeping liquids down. She has reported some bloating in her abdomen as well. She has tried peptobismol and an antacid with no relief. She denies diarrhea, and her last bowel movement was yesterday afternoon. She has also been having a headache today as well as lightheadedness and chills. She does state she has some pain in her right inguinal hernia today, but she has occasional pain here chronically. She denies urinary symptoms, shortness of breath, new cough, and dizziness.   Past Medical History  Diagnosis Date  . Nonischemic cardiomyopathy     EF initially 20%; Last measurement up to 50% per echo in August of 2012  . CHF (congestive heart failure)   . LBBB (left bundle branch block)   . Mitral regurgitation   . Melanoma     right arm  . Hypothyroidism   . Erosive gastritis 08/2004  . Hyperplastic polyps of stomach 06/2002  . Barrett's esophagus 04/2008  . GERD (gastroesophageal reflux disease)   . Diverticulitis   . Anxiety disorder   . HTN  (hypertension)   . ICD (implantable cardiac defibrillator) in place     BiV/ICD; s/p removal of ICD with insertion of BiV PPM 04/26/12  . Hair thinning   . Unspecified sinusitis (chronic)   . Unspecified chronic bronchitis   . Depressive disorder, not elsewhere classified   . Restless legs syndrome (RLS)   . Chronic airway obstruction, not elsewhere classified   . Benign neoplasm of colon   . Other and unspecified hyperlipidemia   . Unspecified nasal polyp   . Pain in joint, lower leg   . Synovial cyst of popliteal space   . Complication of anesthesia     slow to wake up  . Pacemaker 04/26/2012  . Sleep apnea   . Other and unspecified hyperlipidemia   . Dysthymic disorder   . Palpitations    Past Surgical History  Procedure Laterality Date  . Defibrillator insertion      s/p removal of previously implanted BiV ICD and insertion of a new BiV pacemaker on 04/26/12  . Left arm surgery      left  . Abdominal hysterectomy  1076    endometriosis  . Right breast lumpectomy  1988    Dr. Francina Ames  . Appendectomy    . Tonsillectomy    . Right knee surgery      arthroscopy: Dr. Simonne Come  . Excision of wen  1984    Dr. Valrie Hart  . Excise mole of lip  2001    Dr. Everlene Farrier  . Inguinal hernia repair Right 09/26/2012    Procedure: RIGHT INGUINAL HERNIA REPAIR WITH MESH;  Surgeon: Adolph Pollack, MD;  Location: [facility] OR;  Service: General;  Laterality: Right;  . Insertion of mesh Right 09/26/2012    Procedure: INSERTION OF MESH;  Surgeon: Adolph Pollack, MD;  Location[name] OR;  Service: General;  Laterality: Right;   Family History  Problem Relation Age of Onset  . Heart disease Other     maternal side  . Colon cancer Paternal Uncle   . Breast cancer Maternal Aunt   . Diabetes Neg Hx   . Stroke Father    History  Substance Use Topics  . Smoking status: Former Smoker    Types: Cigarettes  . Smokeless tobacco: Never Used     Comment: Does'nt reall year quit   . Alcohol Use:  No   OB History   Grav Para Term Preterm Abortions TAB SAB Ect Mult Living                 Review of Systems  Allergies  Cephalexin; Doxycycline; Ofloxacin; Tape; and Latex  Home Medications   Current Outpatient Rx  Name  Route  Sig  Dispense  Refill  . acetaminophen (TYLENOL) 500 MG tablet   Oral   Take 500 mg by mouth every 6 (six) hours as needed for pain. For pain         . Ascorbic Acid (VITAMIN C) 1000 MG tablet   Oral   Take 1,000 mg by mouth daily.         . Biotin 1000 MCG tablet   Oral   Take 5,000 mcg by mouth daily.          . Calcium Carbonate-Vitamin D (CALCIUM + D PO)   Oral   Take 1 tablet by mouth daily. 1200mg  of Calcium and 1000mg  Vit D         . citalopram (CELEXA) 20 MG tablet               . Cyanocobalamin (VITAMIN B 12 PO)   Oral   Take 1 tablet by mouth daily.          . digoxin (LANOXIN) 0.125 MG tablet   Oral   Take 1 tablet (125 mcg total) by mouth daily.   90 tablet   3     90 day supply is acceptable   . ESTRACE VAGINAL 0.1 MG/GM vaginal cream   Vaginal   Place 1 g vaginally 2 (two) times a week.          . estrogen-methylTESTOSTERone (ESTRATEST HS) 0.625-1.25 MG per tablet   Oral   Take 1 tablet by mouth daily.          . furosemide (LASIX) 20 MG tablet   Oral   Take 20 mg by mouth daily as needed. For fluid retention         . levothyroxine (SYNTHROID, LEVOTHROID) 112 MCG tablet   Oral   Take 112 mcg by mouth daily.           . Magnesium 250 MG TABS   Oral   Take 1 tablet by mouth daily.          . metoprolol tartrate (LOPRESSOR) 25 MG tablet   Oral   Take 12.5 mg by mouth daily.          . Multiple Vitamin (MULTIVITAMIN WITH MINERALS) TABS   Oral  Take 1 tablet by mouth daily.         . mupirocin ointment (BACTROBAN) 2 %      Apply to tender finger twice daily   22 g   3   . OVER THE COUNTER MEDICATION   Both Eyes   Place 1 drop into both eyes daily as needed. OTC Dry Eyes  Eyedrop  For dry eyes         . potassium chloride SA (K-DUR,KLOR-CON) 20 MEQ tablet   Oral   Take 20 mEq by mouth daily as needed. When taking the lasix         . RABEprazole (ACIPHEX) 20 MG tablet   Oral   Take 1 tablet (20 mg total) by mouth daily.   90 tablet   0   . trandolapril (MAVIK) 2 MG tablet   Oral   Take 1 tablet (2 mg total) by mouth 2 (two) times daily.   60 tablet   6    BP 145/62  Pulse 64  Temp(Src) 97.5 F (36.4 C) (Oral)  Resp 18  Ht 5\' 9"  (1.753 m)  Wt 137 lb (62.143 kg)  BMI 20.22 kg/m2  SpO2 97% Physical Exam  Constitutional: She is oriented to person, place, and time. She appears well-developed and well-nourished. No distress.  HENT:  Head: Normocephalic and atraumatic.  Mouth/Throat: Oropharynx is clear and moist.  Eyes: Conjunctivae and EOM are normal. Pupils are equal, round, and reactive to light.  Neck: Normal range of motion. Neck supple.  Cardiovascular: Normal rate.   Pulmonary/Chest: Breath sounds normal. No respiratory distress. She has no wheezes. She has no rales.  Abdominal: There is tenderness.  Mild distention of lower quadrants.  Tenderness to LLQ.  A hernia/lipoma noted to RLQ inferior to well healing abdominal surgical scar without evidence of infection, nontender on palpation.    Musculoskeletal: She exhibits no edema.  Neurological: She is alert and oriented to person, place, and time.  Skin: Skin is warm. No rash noted.  Psychiatric: She has a normal mood and affect.    ED Course  Procedures (including critical care time)  12:31 AM Pt here with c/o abd pain with associated n/v no bm.  Lower abdominal tenderness on exam, non surgical abdomen.  Has elevated WBC of 15, with left shift.  Normal lactic acid.  Acute abdominal series with evidence of air fluid level concerning for possible sbo, albeit early.  Plan to obtain CT of abd/pelvis for further evaluation.  Pt reports pain medication has helped.  Care discussed with  attending.    1:01 AM Pt reports she feels better.  She refuses EKG and abd/pelvic CT scan.  I spend a moderate amount of time discuss the reason behind performing the test, as well as the risk of leaving AMA including worsening sxs, even death.  Pt voice understanding, pt is oriented, and mentating appropriately and understanding of the risk.  I strongly urge to return if sxs worsen.    Labs Review Labs Reviewed  CBC WITH DIFFERENTIAL - Abnormal; Notable for the following:    WBC 15.0 (*)    Hemoglobin 15.3 (*)    Neutrophils Relative % 85 (*)    Neutro Abs 12.7 (*)    Lymphocytes Relative 6 (*)    Monocytes Absolute 1.3 (*)    All other components within normal limits  POCT I-STAT, CHEM 8 - Abnormal; Notable for the following:    Sodium 131 (*)    Glucose, Bld  112 (*)    Calcium, Ion 1.10 (*)    Hemoglobin 16.3 (*)    HCT 48.0 (*)    All other components within normal limits  HEPATIC FUNCTION PANEL  LIPASE, BLOOD  TROPONIN I  URINALYSIS, ROUTINE W REFLEX MICROSCOPIC  CG4 I-STAT (LACTIC ACID)   Imaging Review Dg Abd Acute W/chest  12/21/2012   CLINICAL DATA:  Abdominal pain.  EXAM: ACUTE ABDOMEN SERIES (ABDOMEN 2 VIEW & CHEST 1 VIEW)  COMPARISON:  Most recent chest x-ray 09/19/2012  FINDINGS: Normal cardiomediastinal silhouette. Transvenous pacer with AICD lead good position. There is no free air. The bowel gas pattern is abnormal with dilated loops of small bowel displaying air-fluid levels. Early or partial small bowel obstruction not excluded; no renal calculi are evident. Negative osseous structures. Moderate stool burden on the right. Ovoid density right upper quadrant may represent a pill.  IMPRESSION: Negative abdominal radiographs.  No acute cardiopulmonary disease.   Electronically Signed   By: Davonna Belling M.D.   On: 12/21/2012 00:31    EKG Interpretation   None       MDM   1. Abdominal pain   2. Leukocytosis    BP 145/62  Pulse 64  Temp(Src) 97.5 F (36.4  C) (Oral)  Resp 18  Ht 5\' 9"  (1.753 m)  Wt 137 lb (62.143 kg)  BMI 20.22 kg/m2  SpO2 97%  I have reviewed nursing notes and vital signs. I personally reviewed the imaging tests through PACS system  I reviewed available ER/hospitalization records thought the EMR     Fayrene Helper, [place] 12/21/12 0103

## 2012-12-21 ENCOUNTER — Emergency Department (HOSPITAL_COMMUNITY): Payer: Medicare Other

## 2012-12-21 ENCOUNTER — Telehealth: Payer: Self-pay | Admitting: *Deleted

## 2012-12-21 LAB — TROPONIN I: Troponin I: <0.3 ng/mL (ref <0.30)

## 2012-12-21 LAB — HEPATIC FUNCTION PANEL
ALT: 15 U/L (ref 0–35)
AST: 24 U/L (ref 0–37)
Albumin: 4.1 g/dL (ref 3.5–5.2)
Total Protein: 7.1 g/dL (ref 6.0–8.3)

## 2012-12-21 NOTE — Progress Notes (Signed)
Patient is refusing CT because she does not want to drink contrast and she had one done recently.

## 2012-12-21 NOTE — Telephone Encounter (Signed)
Patient called and stated that she had to go to ER due to Nausea and Vomiting. Says she cant keep anything on her stomach and she has been having stomach pains.The ER stated that she had a touch of food poisoning. But patient is fearful it is coming from the Hernia. Patient would like for you to call her regarding this and the second opinion for a surgeon. Please Advise.  #  [phone]

## 2012-12-21 NOTE — ED Notes (Signed)
Informed [place], PA of pt's refusal for an EKG and CT scan. Greta Doom, PA at bedside.

## 2012-12-22 ENCOUNTER — Ambulatory Visit (INDEPENDENT_AMBULATORY_CARE_PROVIDER_SITE_OTHER): Payer: Medicare Other | Admitting: Gastroenterology

## 2012-12-22 ENCOUNTER — Observation Stay (HOSPITAL_COMMUNITY): Payer: Medicare Other

## 2012-12-22 ENCOUNTER — Encounter (HOSPITAL_COMMUNITY): Payer: Self-pay | Admitting: *Deleted

## 2012-12-22 ENCOUNTER — Ambulatory Visit: Payer: Medicare Other | Admitting: Gastroenterology

## 2012-12-22 ENCOUNTER — Telehealth: Payer: Self-pay | Admitting: Gastroenterology

## 2012-12-22 ENCOUNTER — Encounter: Payer: Self-pay | Admitting: Gastroenterology

## 2012-12-22 ENCOUNTER — Inpatient Hospital Stay (HOSPITAL_COMMUNITY)
Admission: AD | Admit: 2012-12-22 | Discharge: 2013-01-10 | DRG: 329 | Disposition: A | Discharge status: Skilled Nursing Facility | Payer: Medicare Other | Source: Ambulatory Visit | Attending: Surgery | Admitting: Surgery

## 2012-12-22 VITALS — BP 135/56 | HR 66 | Temp 98.6°F | Resp 22 | Ht 68.0 in | Wt 138.0 lb

## 2012-12-22 VITALS — BP 100/46 | HR 76 | Ht 68.5 in | Wt 140.0 lb

## 2012-12-22 DIAGNOSIS — K4140 Unilateral femoral hernia, with gangrene, not specified as recurrent: Secondary | ICD-10-CM | POA: Diagnosis present

## 2012-12-22 DIAGNOSIS — K560 Paralytic ileus: Secondary | ICD-10-CM | POA: Diagnosis not present

## 2012-12-22 DIAGNOSIS — Z950 Presence of cardiac pacemaker: Secondary | ICD-10-CM

## 2012-12-22 DIAGNOSIS — Z8719 Personal history of other diseases of the digestive system: Secondary | ICD-10-CM

## 2012-12-22 DIAGNOSIS — I4729 Other ventricular tachycardia: Secondary | ICD-10-CM | POA: Diagnosis not present

## 2012-12-22 DIAGNOSIS — F329 Major depressive disorder, single episode, unspecified: Secondary | ICD-10-CM

## 2012-12-22 DIAGNOSIS — G4733 Obstructive sleep apnea (adult) (pediatric): Secondary | ICD-10-CM | POA: Diagnosis present

## 2012-12-22 DIAGNOSIS — IMO0002 Reserved for concepts with insufficient information to code with codable children: Secondary | ICD-10-CM

## 2012-12-22 DIAGNOSIS — R112 Nausea with vomiting, unspecified: Secondary | ICD-10-CM | POA: Diagnosis not present

## 2012-12-22 DIAGNOSIS — Z5331 Laparoscopic surgical procedure converted to open procedure: Secondary | ICD-10-CM

## 2012-12-22 DIAGNOSIS — E039 Hypothyroidism, unspecified: Secondary | ICD-10-CM | POA: Diagnosis present

## 2012-12-22 DIAGNOSIS — F3289 Other specified depressive episodes: Secondary | ICD-10-CM

## 2012-12-22 DIAGNOSIS — I509 Heart failure, unspecified: Secondary | ICD-10-CM | POA: Diagnosis present

## 2012-12-22 DIAGNOSIS — I5022 Chronic systolic (congestive) heart failure: Secondary | ICD-10-CM

## 2012-12-22 DIAGNOSIS — K559 Vascular disorder of intestine, unspecified: Secondary | ICD-10-CM | POA: Diagnosis present

## 2012-12-22 DIAGNOSIS — D72829 Elevated white blood cell count, unspecified: Secondary | ICD-10-CM | POA: Diagnosis present

## 2012-12-22 DIAGNOSIS — Z85820 Personal history of malignant melanoma of skin: Secondary | ICD-10-CM

## 2012-12-22 DIAGNOSIS — Z79899 Other long term (current) drug therapy: Secondary | ICD-10-CM

## 2012-12-22 DIAGNOSIS — R933 Abnormal findings on diagnostic imaging of other parts of digestive tract: Secondary | ICD-10-CM

## 2012-12-22 DIAGNOSIS — I059 Rheumatic mitral valve disease, unspecified: Secondary | ICD-10-CM | POA: Diagnosis present

## 2012-12-22 DIAGNOSIS — K929 Disease of digestive system, unspecified: Secondary | ICD-10-CM | POA: Diagnosis not present

## 2012-12-22 DIAGNOSIS — E860 Dehydration: Secondary | ICD-10-CM | POA: Diagnosis present

## 2012-12-22 DIAGNOSIS — Y836 Removal of other organ (partial) (total) as the cause of abnormal reaction of the patient, or of later complication, without mention of misadventure at the time of the procedure: Secondary | ICD-10-CM | POA: Diagnosis not present

## 2012-12-22 DIAGNOSIS — E876 Hypokalemia: Secondary | ICD-10-CM | POA: Diagnosis not present

## 2012-12-22 DIAGNOSIS — J189 Pneumonia, unspecified organism: Secondary | ICD-10-CM

## 2012-12-22 DIAGNOSIS — N179 Acute kidney failure, unspecified: Secondary | ICD-10-CM | POA: Diagnosis present

## 2012-12-22 DIAGNOSIS — I493 Ventricular premature depolarization: Secondary | ICD-10-CM

## 2012-12-22 DIAGNOSIS — K56609 Unspecified intestinal obstruction, unspecified as to partial versus complete obstruction: Secondary | ICD-10-CM | POA: Diagnosis present

## 2012-12-22 DIAGNOSIS — G2581 Restless legs syndrome: Secondary | ICD-10-CM | POA: Diagnosis present

## 2012-12-22 DIAGNOSIS — I447 Left bundle-branch block, unspecified: Secondary | ICD-10-CM | POA: Diagnosis present

## 2012-12-22 DIAGNOSIS — I10 Essential (primary) hypertension: Secondary | ICD-10-CM | POA: Diagnosis present

## 2012-12-22 DIAGNOSIS — E440 Moderate protein-calorie malnutrition: Secondary | ICD-10-CM | POA: Diagnosis present

## 2012-12-22 DIAGNOSIS — I9589 Other hypotension: Secondary | ICD-10-CM | POA: Diagnosis not present

## 2012-12-22 DIAGNOSIS — I472 Ventricular tachycardia, unspecified: Secondary | ICD-10-CM | POA: Diagnosis not present

## 2012-12-22 DIAGNOSIS — I519 Heart disease, unspecified: Secondary | ICD-10-CM

## 2012-12-22 DIAGNOSIS — J449 Chronic obstructive pulmonary disease, unspecified: Secondary | ICD-10-CM | POA: Diagnosis present

## 2012-12-22 DIAGNOSIS — F341 Dysthymic disorder: Secondary | ICD-10-CM | POA: Diagnosis present

## 2012-12-22 DIAGNOSIS — I428 Other cardiomyopathies: Secondary | ICD-10-CM

## 2012-12-22 DIAGNOSIS — Z95810 Presence of automatic (implantable) cardiac defibrillator: Secondary | ICD-10-CM

## 2012-12-22 DIAGNOSIS — F419 Anxiety disorder, unspecified: Secondary | ICD-10-CM

## 2012-12-22 DIAGNOSIS — IMO0001 Reserved for inherently not codable concepts without codable children: Principal | ICD-10-CM | POA: Diagnosis present

## 2012-12-22 DIAGNOSIS — J4489 Other specified chronic obstructive pulmonary disease: Secondary | ICD-10-CM | POA: Diagnosis present

## 2012-12-22 DIAGNOSIS — K219 Gastro-esophageal reflux disease without esophagitis: Secondary | ICD-10-CM | POA: Diagnosis present

## 2012-12-22 DIAGNOSIS — K4031 Unilateral inguinal hernia, with obstruction, without gangrene, recurrent: Secondary | ICD-10-CM | POA: Diagnosis present

## 2012-12-22 DIAGNOSIS — R109 Unspecified abdominal pain: Secondary | ICD-10-CM | POA: Diagnosis not present

## 2012-12-22 DIAGNOSIS — E785 Hyperlipidemia, unspecified: Secondary | ICD-10-CM | POA: Diagnosis present

## 2012-12-22 LAB — COMPREHENSIVE METABOLIC PANEL
ALT: 21 U/L (ref 0–35)
AST: 39 U/L — ABNORMAL HIGH (ref 0–37)
Alkaline Phosphatase: 78 U/L (ref 39–117)
BUN: 30 mg/dL — ABNORMAL HIGH (ref 6–23)
CO2: 25 mEq/L (ref 19–32)
Calcium: 9.5 mg/dL (ref 8.4–10.5)
Creatinine, Ser: 1.16 mg/dL — ABNORMAL HIGH (ref 0.50–1.10)
GFR calc Af Amer: 51 mL/min — ABNORMAL LOW (ref >90)
Glucose, Bld: 94 mg/dL (ref 70–99)
Potassium: 3.7 mEq/L (ref 3.5–5.1)
Sodium: 124 mEq/L — ABNORMAL LOW (ref 135–145)
Total Bilirubin: 1.1 mg/dL (ref 0.3–1.2)
Total Protein: 7.1 g/dL (ref 6.0–8.3)

## 2012-12-22 LAB — CBC
HCT: 41.3 % (ref 36.0–46.0)
Hemoglobin: 14.8 g/dL (ref 12.0–15.0)
MCH: 30.8 pg (ref 26.0–34.0)
MCHC: 35.8 g/dL (ref 30.0–36.0)
RDW: 12.7 % (ref 11.5–15.5)
WBC: 15.4 10*3/uL — ABNORMAL HIGH (ref 4.0–10.5)

## 2012-12-22 MED ORDER — METOPROLOL TARTRATE 12.5 MG HALF TABLET
12.5000 mg | ORAL_TABLET | Freq: Every day | ORAL | Status: DC
  Filled 2012-12-22: qty 1

## 2012-12-22 MED ORDER — HEPARIN SODIUM (PORCINE) 5000 UNIT/ML IJ SOLN
5000.0000 [IU] | Freq: Three times a day (TID) | INTRAMUSCULAR | Status: DC
  Administered 2012-12-22 – 2012-12-24 (×7): 5000 [IU] via SUBCUTANEOUS
  Filled 2012-12-22 (×11): qty 1

## 2012-12-22 MED ORDER — ONDANSETRON HCL 4 MG PO TABS: 4.0000 mg | ORAL_TABLET | Freq: Four times a day (QID) | ORAL | Status: DC | PRN

## 2012-12-22 MED ORDER — LORAZEPAM 2 MG/ML IJ SOLN
0.5000 mg | Freq: Every evening | INTRAMUSCULAR | Status: DC | PRN
  Administered 2012-12-23 – 2013-01-08 (×8): 0.5 mg via INTRAVENOUS
  Filled 2012-12-22 (×8): qty 1

## 2012-12-22 MED ORDER — LEVOTHYROXINE SODIUM 112 MCG PO TABS
112.0000 ug | ORAL_TABLET | Freq: Every day | ORAL | Status: DC
  Administered 2012-12-23: 112 ug via ORAL
  Filled 2012-12-22 (×2): qty 1

## 2012-12-22 MED ORDER — POTASSIUM CHLORIDE IN NACL 20-0.9 MEQ/L-% IV SOLN
INTRAVENOUS | Status: DC
  Administered 2012-12-23 – 2012-12-25 (×7): via INTRAVENOUS
  Filled 2012-12-22 (×10): qty 1000

## 2012-12-22 MED ORDER — METOPROLOL TARTRATE 12.5 MG HALF TABLET
12.5000 mg | ORAL_TABLET | Freq: Every day | ORAL | Status: DC
  Administered 2012-12-23: 12.5 mg via ORAL
  Filled 2012-12-22: qty 1

## 2012-12-22 MED ORDER — HYDROMORPHONE HCL PF 1 MG/ML IJ SOLN
0.5000 mg | INTRAMUSCULAR | Status: DC | PRN
  Administered 2012-12-22 – 2013-01-05 (×21): 0.5 mg via INTRAVENOUS
  Filled 2012-12-22 (×23): qty 1

## 2012-12-22 MED ORDER — TRANDOLAPRIL 2 MG PO TABS
2.0000 mg | ORAL_TABLET | Freq: Two times a day (BID) | ORAL | Status: DC
  Administered 2012-12-22: 2 mg via ORAL
  Filled 2012-12-22 (×3): qty 1

## 2012-12-22 MED ORDER — SODIUM CHLORIDE 0.9 % IV SOLN: 500.0000 mL | INTRAVENOUS | Status: DC

## 2012-12-22 MED ORDER — DIGOXIN 125 MCG PO TABS
125.0000 ug | ORAL_TABLET | Freq: Every day | ORAL | Status: DC
  Administered 2012-12-23: 125 ug via ORAL
  Filled 2012-12-22: qty 1

## 2012-12-22 MED ORDER — ONDANSETRON HCL 4 MG/2ML IJ SOLN
4.0000 mg | Freq: Four times a day (QID) | INTRAMUSCULAR | Status: DC | PRN
  Administered 2012-12-22 – 2013-01-04 (×6): 4 mg via INTRAVENOUS
  Filled 2012-12-22 (×6): qty 2

## 2012-12-22 NOTE — Progress Notes (Signed)
Vomiting for past 3-4 days, planning on EGD today given recent otherwise negative workup

## 2012-12-22 NOTE — Telephone Encounter (Signed)
Pt states she was seen a couple of days ago in the ER with N/V and abdominal pain. ER wanted to do a CT but the pt did not want it done, states she just recently had one. Discussed with pt that the only opening with PA today is 11am. Pt is afraid she may have an obstruction. Pt scheduled to see Doug Sou PA today at 11am. Discussed with pt to skip her bath and to come on in for the visit. She verbalized understanding.

## 2012-12-22 NOTE — Progress Notes (Addendum)
Patient came to recovery room.  No procedure was done.   I just got initial vital signs and vitals before she left.   She's going to 5E at [facility]; I gave the nurse who's taking care of her report.  Gunnar Fusi was notified of her admit to the hospital.   Patient will be a direct through Admitting office.    Hospital transfer sheet completed.  Patient did not experience any of the following events: a burn prior to discharge; a fall within the facility; wrong site/side/patient/procedure/implant event; or a hospital transfer or hospital admission upon discharge from the facility. [phone])

## 2012-12-22 NOTE — Patient Instructions (Addendum)
You have been scheduled for an endoscopy. Please follow written instructions given to you at your visit today. If you use inhalers (even only as needed), please bring them with you on the day of your procedure.   

## 2012-12-22 NOTE — Progress Notes (Signed)
Report to pacu rn, vss, bbs=clear. Dr. Christella Hartigan cancelled case in procedure room. NO Sedation given. Patient to RR.

## 2012-12-22 NOTE — Telephone Encounter (Signed)
Discussed by phone with patient. She has a endoscopy procedure scheduled with Dr. Russella Dar today, 12/22/12. I recommended that she call [facility] to arrange an appointment with a surgeon there. Dr. Abbey Chatters advised her he would be glad to assist with this. I recommend Dr. Johna Sheriff and Dr. Derrell Lolling, but other physicians may be suitable.

## 2012-12-22 NOTE — H&P (Signed)
Primary Care Physician:  Kimber Relic, MD Primary Gastroenterologist:    Claudette Head, MD  CHIEF COMPLAINT:  Nausea and vomiting  HPI: [name] is a 79 y.o. female with multiple medical problems and is s/p several abdominal surgeries.  She is known to Dr. Russella Dar in our practice for GERD, Barrett's and diverticulosis. Patient was seen in our office earlier today for evaluation of nausea, vomiting and diffuse abdominal pain which began on Tuesday. Patient had been evaluated in ED on Wednesday, abdominal films done and described under "impression" as being negative. Labs unrevealing except for WBC of 15,000. For further evaluation patient was sent for EGD this afternoon to be done by Dr. Christella Hartigan. Prior to starting the procedure it was discovered that patient's ED abdominal films actually described (in body of report) a possibly early partial SBO. EGD not done, patient admitted instead for further evaluation and treatment. She has no history of sbo but has had abdominal surgeries in the past (appi, hysterectomy, had inguinal hernia repair in July)  Patient had several episodes of vomiting this am. No vomiting in several hours but has been NPO. She is still nauseated. She has generalized abdominal pain. No diarrhea, not passing flatus.   Past Medical History  Diagnosis Date  . Nonischemic cardiomyopathy     EF initially 20%; Last measurement up to 50% per echo in August of 2012  . CHF (congestive heart failure)   . LBBB (left bundle branch block)   . Mitral regurgitation   . Melanoma     right arm  . Hypothyroidism   . Erosive gastritis 08/2004  . Hyperplastic polyps of stomach 06/2002  . Barrett's esophagus 04/2008  . GERD (gastroesophageal reflux disease)   . Diverticulitis   . Anxiety disorder   . HTN (hypertension)   . ICD (implantable cardiac defibrillator) in place     BiV/ICD; s/p removal of ICD with insertion of BiV PPM 04/26/12  . Unspecified sinusitis (chronic)   . Unspecified  chronic bronchitis   . Depressive disorder, not elsewhere classified   . Restless legs syndrome (RLS)   . Chronic airway obstruction, not elsewhere classified   . Benign neoplasm of colon   . Other and unspecified hyperlipidemia   . Unspecified nasal polyp   . Synovial cyst of popliteal space   . Complication of anesthesia     slow to wake up  . Pacemaker 04/26/2012  . Sleep apnea   . Other and unspecified hyperlipidemia   . Dysthymic disorder     Past Surgical History  Procedure Laterality Date  . Defibrillator insertion      s/p removal of previously implanted BiV ICD and insertion of a new BiV pacemaker on 04/26/12  . Left arm surgery    . Abdominal hysterectomy  1076    endometriosis  . Right breast lumpectomy  1988    Dr. Francina Ames  . Appendectomy    . Tonsillectomy    . Right knee surgery      arthroscopy: Dr. Simonne Come  . Excision of wen  1984    Dr. Valrie Hart  . Excise mole of lip  2001    Dr. Everlene Farrier  . Inguinal hernia repair Right 09/26/2012    Procedure: RIGHT INGUINAL HERNIA REPAIR WITH MESH;  Surgeon: Adolph Pollack, MD;  Location: [facility] OR;  Service: General;  Laterality: Right;  . Insertion of mesh Right 09/26/2012    Procedure: INSERTION OF MESH;  Surgeon: Adolph Pollack, MD;  Location:  [facility] OR;  Service: General;  Laterality: Right;    Prior to Admission medications   Medication Sig Start Date End Date Taking? Authorizing Provider  acetaminophen (TYLENOL) 500 MG tablet Take 500 mg by mouth every 6 (six) hours as needed for pain. For pain    Historical Provider, MD  Ascorbic Acid (VITAMIN C) 1000 MG tablet Take 1,000 mg by mouth daily.    Historical Provider, MD  Biotin 1000 MCG tablet Take 5,000 mcg by mouth daily.     Historical Provider, MD  Calcium Carbonate-Vitamin D (CALCIUM + D PO) Take 1 tablet by mouth daily. 1200mg  of Calcium and 1000mg  Vit D    Historical Provider, MD  Cyanocobalamin (VITAMIN B 12 PO) Take 1 tablet by mouth daily.     Historical  Provider, MD  digoxin (LANOXIN) 0.125 MG tablet Take 1 tablet (125 mcg total) by mouth daily. 10/27/12   Peter M [place], MD  ESTRACE VAGINAL 0.1 MG/GM vaginal cream Place 1 g vaginally 2 (two) times a week.  03/13/12   Historical Provider, MD  estrogen-methylTESTOSTERone (ESTRATEST HS) 0.625-1.25 MG per tablet Take 1 tablet by mouth daily.     Historical Provider, MD  furosemide (LASIX) 20 MG tablet Take 20 mg by mouth daily as needed. For fluid retention    Historical Provider, MD  levothyroxine (SYNTHROID, LEVOTHROID) 112 MCG tablet Take 112 mcg by mouth daily.      Historical Provider, MD  Magnesium 250 MG TABS Take 1 tablet by mouth daily.     Historical Provider, MD  metoprolol tartrate (LOPRESSOR) 25 MG tablet Take 12.5 mg by mouth daily.  06/20/12   Marinus Maw, MD  Multiple Vitamin (MULTIVITAMIN WITH MINERALS) TABS Take 1 tablet by mouth daily.    Historical Provider, MD  OVER THE COUNTER MEDICATION Place 1 drop into both eyes daily as needed. OTC Dry Eyes Eyedrop  For dry eyes    Historical Provider, MD  potassium chloride SA (K-DUR,KLOR-CON) 20 MEQ tablet Take 20 mEq by mouth daily as needed. When taking the lasix    Historical Provider, MD  RABEprazole (ACIPHEX) 20 MG tablet Take 1 tablet (20 mg total) by mouth daily. 11/02/12   Meryl Dare, MD  trandolapril (MAVIK) 2 MG tablet Take 1 tablet (2 mg total) by mouth 2 (two) times daily. 09/13/12   Peter M [place], MD    No current facility-administered medications for this encounter.   Facility-Administered Medications Ordered in Other Encounters  Medication Dose Route Frequency Provider Last Rate Last Dose  . 0.9 %  sodium chloride infusion  500 mL Intravenous Continuous Rachael Fee, MD        Allergies as of 12/22/2012 - Review Complete 12/22/2012  Allergen Reaction Noted  . Cephalexin Itching and Swelling   . Doxycycline Other (See Comments)   . Ofloxacin Other (See Comments)   . Tape Other (See Comments) 04/10/2012  .  Latex Rash 09/15/2012    Family History  Problem Relation Age of Onset  . Heart disease Other     maternal side  . Colon cancer Paternal Uncle   . Breast cancer Maternal Aunt   . Diabetes Neg Hx   . Stroke Father     History   Social History  . Marital Status: Single    Spouse Name: N/A    Number of Children: 0  . Years of Education: N/A   Occupational History  . Retired   .     Social History  Main Topics  . Smoking status: Former Smoker    Types: Cigarettes  . Smokeless tobacco: Never Used     Comment: Does'nt reall year quit   . Alcohol Use: No  . Drug Use: No  . Sexual Activity: No   Review of Systems:  All systems reviewed an negative except where noted in HPI.  Physical Exam: Vital signs in last 24 hours: Temp:  [98.6 F (37 C)] 98.6 F (37 C) (10/17 1414) Pulse Rate:  [62-76] 66 (10/17 1550) Resp:  [11-22] 22 (10/17 1550) BP: (98-145)/(46-66) 135/56 mmHg (10/17 1550) SpO2:  [96 %-98 %] 96 % (10/17 1550) Weight:  [138 lb (62.596 kg)-140 lb (63.504 kg)] 138 lb (62.596 kg) (10/17 1411)   General:  Pleasant, thin white female in NAD Head:  Normocephalic and atraumatic. Eyes:  Sclera clear, no icterus.   Conjunctiva pink. Ears:  Normal auditory acuity. Mouth:  No deformity or lesions.  Neck:  Supple; no masses . Lungs:  Clear throughout to auscultation.   No wheezes, crackles, or rhonchi. No acute distress. Heart:  Regular rate and rhythm. Abdomen:  Soft, non-tender, mildly distended with hyperactive bowel sounds.  No masses felt. No hepatomegaly appreciated. No obvious masses.  Healing RLQ surgical scar.    Msk:  Symmetrical without gross deformities.. Pulses:  Normal pulses noted. Extremities:  Without edema. Neurologic:  Alert and  oriented x4;  grossly normal neurologically. Skin:  Intact without significant lesions or rashes. Cervical Nodes:  No significant cervical adenopathy. Psych:  Alert and cooperative. Normal mood and affect.  Lab  Results:  Recent Labs  12/20/12 2320 12/20/12 2330  WBC 15.0*  --   HGB 15.3* 16.3*  HCT 43.0 48.0*  PLT 227  --    BMET  Recent Labs  12/20/12 2330  NA 131*  K 3.9  CL 97  GLUCOSE 112*  BUN 15  CREATININE 0.80   LFT  Recent Labs  12/20/12 2320  PROT 7.1  ALBUMIN 4.1  AST 24  ALT 15  ALKPHOS 90  BILITOT 0.9  BILIDIR 0.2  IBILI 0.7   Studies/Results: Dg Abd Acute W/chest  12/21/2012   CLINICAL DATA:  Abdominal pain.  EXAM: ACUTE ABDOMEN SERIES (ABDOMEN 2 VIEW & CHEST 1 VIEW)  COMPARISON:  Most recent chest x-ray 09/19/2012  FINDINGS: Normal cardiomediastinal silhouette. Transvenous pacer with AICD lead good position. There is no free air. The bowel gas pattern is abnormal with dilated loops of small bowel displaying air-fluid levels. Early or partial small bowel obstruction not excluded; no renal calculi are evident. Negative osseous structures. Moderate stool burden on the right. Ovoid density right upper quadrant may represent a pill.  IMPRESSION: Negative abdominal radiographs.  No acute cardiopulmonary disease.   Electronically Signed   By: Davonna Belling M.D.   On: 12/21/2012 00:31    Impression / Plan:  49. 79 year old female with acute nausea, vomiting, generalized abdominal pain in setting of recent abdominal films suggestive of developing partial sbo.   Will admit for fluid repletion, IV anti-emetics, labs, repeat abdominal films.   Depending on abdominal films she may need NGT for decompression.   Patient was able to tolerate her home meds this am. Will try and continue home meds with sips of fluids. If she resumes vomiting, or needs NGT convert meds to IV form.   Given cardiac history will place her on telemetry, at least until the am.   If abdominal films + for obstruction, patient will need CTscan  when able to hold down contrast.   2.  Multiple medical problems including but no limited to dilated cardiomyopathy, LBBB, HTN, CHF, thyroid disease,  melanoma,and anxiety / depression. Patient is followed by [facility] for chronic CHF / nonischemic cardiomyopathy. EF 45-50%. Patient is s/p biventricular pacemaker. Her ICD was revised to just pacemaker earlier this year.    LOS: 0 days   Willette Cluster  12/22/2012, 4:19 PM Attending MD note:   I have reviewed the above note, and agree with plan of treatment. Dr Christella Hartigan saw the pt today in the office, I will follow up in am  W[facility] Pager # [phone]

## 2012-12-22 NOTE — Progress Notes (Signed)
12/22/2012 [name] [id number] 24-May-1932   History of Present Illness:  Patient is a 79 year old female who is a patient of Dr. Ardell Isaacs. She just saw him last month for refills of her medications and was feeling well at that time. She presents to our office today with complaints of sudden onset nausea, vomiting, and diffuse abdominal pain that began Tuesday night. She says that she ate a hamburger from [employer] around 6:30 PM and around 10 or 11:00 PM she started with these symptoms, and they have been persistent since that time. She denies any diarrhea, in fact, her last bowel movement was on Tuesday. She is passing some flatus, but not much. She is belching a lot. Her abdominal pain is diffuse in the upper and lower abdomen. States that she cannot keep anything down and even vomited a couple of bites of old female that she ate this morning. She went to the emergency department on Wednesday and abdominal x-rays were normal. Lipase was normal as well as a CMP except for a slightly low sodium at 131. CBC showed an elevated white blood cell count at 15,000. They apparently wanted to do a CT scan of her abdomen, however, she declined because she had just underwent a CT scan on October 9 for evaluation of a recurrent right inguinal hernia.  She does not look extremely ill but stated "I don't know if I am going to make it".   Current Medications, Allergies, Past Medical History, Past Surgical History, Family History and Social History were reviewed in [employer] record.   Physical Exam: BP 100/46  Pulse 76  Ht 5' 8.5" (1.74 m)  Wt 140 lb (63.504 kg)  BMI 20.98 kg/m2 General:  Elderly white female in no acute distress Head: Normocephalic and atraumatic Eyes:  Sclerae anicteric, conjunctiva pink  Ears: Normal auditory acuity Lungs: Clear throughout to auscultation Heart: Regular rate and rhythm Abdomen: Soft, non-distended.  BS present and somewhat hyperactive.  Diffuse  TTP but abdominal exam is benign with no signs of acute abdomen.  Diagonal scar noted in right groin from hernia repair. Musculoskeletal: Symmetrical with no gross deformities  Extremities: No edema  Neurological: Alert oriented x 4, grossly nonfocal Psychological:  Alert and cooperative. Normal mood and affect  Assessment and Recommendations: -Sudden onset of nausea, vomiting, and diffuse abdominal pain 2 days ago, which has been persistent.  Abdominal x-rays negative in the ED but elevated WBC count.  Unsure of the cause of her symptoms.  ? Obstruction, but once again this did not show up on x-ray.  I spoke with Dr. Larae Grooms who recommended that we add her on for EGD with him this afternoon.

## 2012-12-23 DIAGNOSIS — R933 Abnormal findings on diagnostic imaging of other parts of digestive tract: Secondary | ICD-10-CM

## 2012-12-23 DIAGNOSIS — R112 Nausea with vomiting, unspecified: Secondary | ICD-10-CM

## 2012-12-23 DIAGNOSIS — K56609 Unspecified intestinal obstruction, unspecified as to partial versus complete obstruction: Secondary | ICD-10-CM | POA: Diagnosis present

## 2012-12-23 LAB — DIGOXIN LEVEL: Digoxin Level: 0.7 ng/mL — ABNORMAL LOW (ref 0.8–2.0)

## 2012-12-23 LAB — CBC
Hemoglobin: 15.2 g/dL — ABNORMAL HIGH (ref 12.0–15.0)
MCH: 31.2 pg (ref 26.0–34.0)
MCHC: 36.3 g/dL — ABNORMAL HIGH (ref 30.0–36.0)
MCV: 86 fL (ref 78.0–100.0)
Platelets: 194 10*3/uL (ref 150–400)
RDW: 12.6 % (ref 11.5–15.5)

## 2012-12-23 LAB — BASIC METABOLIC PANEL
BUN: 34 mg/dL — ABNORMAL HIGH (ref 6–23)
Calcium: 9.1 mg/dL (ref 8.4–10.5)
Creatinine, Ser: 0.97 mg/dL (ref 0.50–1.10)
GFR calc Af Amer: 63 mL/min — ABNORMAL LOW (ref >90)
GFR calc non Af Amer: 55 mL/min — ABNORMAL LOW (ref >90)
Sodium: 125 mEq/L — ABNORMAL LOW (ref 135–145)

## 2012-12-23 MED ORDER — SODIUM CHLORIDE 0.9 % IV BOLUS (SEPSIS)
500.0000 mL | Freq: Once | INTRAVENOUS | Status: AC
  Administered 2012-12-23: 500 mL via INTRAVENOUS

## 2012-12-23 MED ORDER — DIGOXIN 0.25 MG/ML IJ SOLN
0.1250 mg | Freq: Every day | INTRAMUSCULAR | Status: DC
  Administered 2012-12-24 – 2013-01-09 (×17): 0.125 mg via INTRAVENOUS
  Filled 2012-12-23 (×22): qty 0.5

## 2012-12-23 MED ORDER — MENTHOL 3 MG MT LOZG
1.0000 | LOZENGE | OROMUCOSAL | Status: DC | PRN
  Administered 2012-12-24 – 2012-12-25 (×3): 3 mg via ORAL
  Filled 2012-12-23 (×3): qty 9

## 2012-12-23 MED ORDER — METOPROLOL TARTRATE 1 MG/ML IV SOLN
5.0000 mg | Freq: Every day | INTRAVENOUS | Status: DC
  Administered 2012-12-24 – 2012-12-30 (×6): 5 mg via INTRAVENOUS
  Filled 2012-12-23 (×7): qty 5

## 2012-12-23 MED ORDER — LEVOTHYROXINE SODIUM 100 MCG IV SOLR
50.0000 ug | Freq: Every day | INTRAVENOUS | Status: DC
  Administered 2012-12-24 – 2013-01-07 (×14): 50 ug via INTRAVENOUS
  Filled 2012-12-23 (×16): qty 5

## 2012-12-23 NOTE — Consult Note (Signed)
General Surgery [facility]  Reason for Consult: small bowel obstruction, mass at site of hernia repair  Referring Physician:  Dr. Lina Sar, [facility]  [name] is an 79 y.o. female.  HPI: Patient is a 79 yo WF known to our practice after RIH repair with mesh by Dr. Avel Peace on 09/26/2012.  Post op course complicated by "mass" in right groin - possibly lipoma versus femoral hernia containing fatty tissue.  CTA shows fat in region, no bowel involvement.  Patient now admitted with SBO versus ileus.  Patient became ill on Wednesday (12/20/2012) after eating at [employer].  Onset of nausea and emesis.  Admitted on medical service.  AXR with dilated loops of small bowel consistent with ileus versus SBO.  Improved on most recent x-ray.  Previous hx of hysterectomy and appendectomy and hernia repair as above.  Past Medical History  Diagnosis Date  . Nonischemic cardiomyopathy     EF initially 20%; Last measurement up to 50% per echo in August of 2012  . CHF (congestive heart failure)   . LBBB (left bundle branch block)   . Mitral regurgitation   . Melanoma     right arm  . Hypothyroidism   . Erosive gastritis 08/2004  . Hyperplastic polyps of stomach 06/2002  . Barrett's esophagus 04/2008  . GERD (gastroesophageal reflux disease)   . Diverticulitis   . Anxiety disorder   . HTN (hypertension)   . ICD (implantable cardiac defibrillator) in place     BiV/ICD; s/p removal of ICD with insertion of BiV PPM 04/26/12  . Hair thinning   . Unspecified sinusitis (chronic)   . Unspecified chronic bronchitis   . Depressive disorder, not elsewhere classified   . Restless legs syndrome (RLS)   . Chronic airway obstruction, not elsewhere classified   . Benign neoplasm of colon   . Other and unspecified hyperlipidemia   . Unspecified nasal polyp   . Pain in joint, lower leg   . Synovial cyst of popliteal space   . Complication of anesthesia     slow to wake up  .  Pacemaker 04/26/2012  . Sleep apnea   . Other and unspecified hyperlipidemia   . Dysthymic disorder   . Palpitations     Past Surgical History  Procedure Laterality Date  . Defibrillator insertion      s/p removal of previously implanted BiV ICD and insertion of a new BiV pacemaker on 04/26/12  . Left arm surgery    . Abdominal hysterectomy  1076    endometriosis  . Right breast lumpectomy  1988    Dr. Francina Ames  . Appendectomy    . Tonsillectomy    . Right knee surgery      arthroscopy: Dr. Simonne Come  . Excision of wen  1984    Dr. Valrie Hart  . Excise mole of lip  2001    Dr. Everlene Farrier  . Inguinal hernia repair Right 09/26/2012    Procedure: RIGHT INGUINAL HERNIA REPAIR WITH MESH;  Surgeon: Adolph Pollack, MD;  Location: [facility] OR;  Service: General;  Laterality: Right;  . Insertion of mesh Right 09/26/2012    Procedure: INSERTION OF MESH;  Surgeon: Adolph Pollack, MD;  Location: [facility] OR;  Service: General;  Laterality: Right;    Family History  Problem Relation Age of Onset  . Heart disease Other     maternal side  . Colon cancer Paternal Uncle   . Breast cancer Maternal Aunt   .  Diabetes Neg Hx   . Stroke Father     Social History:  reports that she quit smoking about 19 years ago. Her smoking use included Cigarettes. She smoked 0.00 packs per day. She has never used smokeless tobacco. She reports that she does not drink alcohol or use illicit drugs.  Allergies:  Allergies  Allergen Reactions  . Hydrocodone Itching  . Cephalexin Itching and Swelling  . Doxycycline Other (See Comments)    Per pt: unknown  . Ofloxacin Other (See Comments)    Per pt: unknown  . Tape Other (See Comments)    Burns skin.   . Latex Rash    Medications: I have reviewed the patient's current medications.  Results for orders placed during the hospital encounter of 12/22/12 (from the past 48 hour(s))  COMPREHENSIVE METABOLIC PANEL     Status: Abnormal   Collection Time    12/22/12   6:10 PM      Result Value Range   Sodium 124 (*) 135 - 145 mEq/L   Comment: DELTA CHECK NOTED   Potassium 3.7  3.5 - 5.1 mEq/L   Chloride 86 (*) 96 - 112 mEq/L   Comment: DELTA CHECK NOTED   CO2 25  19 - 32 mEq/L   Glucose, Bld 94  70 - 99 mg/dL   BUN 30 (*) 6 - 23 mg/dL   Comment: DELTA CHECK NOTED   Creatinine, Ser 1.16 (*) 0.50 - 1.10 mg/dL   Calcium 9.5  8.4 - 16.1 mg/dL   Total Protein 7.1  6.0 - 8.3 g/dL   Albumin 3.8  3.5 - 5.2 g/dL   AST 39 (*) 0 - 37 U/L   ALT 21  0 - 35 U/L   Alkaline Phosphatase 78  39 - 117 U/L   Total Bilirubin 1.1  0.3 - 1.2 mg/dL   GFR calc non Af Amer 44 (*) >90 mL/min   GFR calc Af Amer 51 (*) >90 mL/min   Comment: (NOTE)     The eGFR has been calculated using the CKD EPI equation.     This calculation has not been validated in all clinical situations.     eGFR's persistently <90 mL/min signify possible Chronic Kidney     Disease.  CBC     Status: Abnormal   Collection Time    12/22/12  6:10 PM      Result Value Range   WBC 15.4 (*) 4.0 - 10.5 K/uL   RBC 4.80  3.87 - 5.11 MIL/uL   Hemoglobin 14.8  12.0 - 15.0 g/dL   HCT 09.6  04.5 - 40.9 %   MCV 86.0  78.0 - 100.0 fL   MCH 30.8  26.0 - 34.0 pg   MCHC 35.8  30.0 - 36.0 g/dL   RDW 81.1  91.4 - 78.2 %   Platelets 197  150 - 400 K/uL  CBC     Status: Abnormal   Collection Time    12/23/12  5:10 AM      Result Value Range   WBC 12.0 (*) 4.0 - 10.5 K/uL   RBC 4.87  3.87 - 5.11 MIL/uL   Hemoglobin 15.2 (*) 12.0 - 15.0 g/dL   HCT 95.6  21.3 - 08.6 %   MCV 86.0  78.0 - 100.0 fL   MCH 31.2  26.0 - 34.0 pg   MCHC 36.3 (*) 30.0 - 36.0 g/dL   RDW 57.8  46.9 - 62.9 %   Platelets 194  150 -  400 K/uL  BASIC METABOLIC PANEL     Status: Abnormal   Collection Time    12/23/12  5:10 AM      Result Value Range   Sodium 125 (*) 135 - 145 mEq/L   Potassium 4.0  3.5 - 5.1 mEq/L   Chloride 89 (*) 96 - 112 mEq/L   CO2 21  19 - 32 mEq/L   Glucose, Bld 82  70 - 99 mg/dL   BUN 34 (*) 6 - 23 mg/dL    Creatinine, Ser 1.61  0.50 - 1.10 mg/dL   Calcium 9.1  8.4 - 09.6 mg/dL   GFR calc non Af Amer 55 (*) >90 mL/min   GFR calc Af Amer 63 (*) >90 mL/min   Comment: (NOTE)     The eGFR has been calculated using the CKD EPI equation.     This calculation has not been validated in all clinical situations.     eGFR's persistently <90 mL/min signify possible Chronic Kidney     Disease.  DIGOXIN LEVEL     Status: Abnormal   Collection Time    12/23/12 11:18 AM      Result Value Range   Digoxin Level 0.7 (*) 0.8 - 2.0 ng/mL    Acute Abdominal Series  12/22/2012   CLINICAL DATA:  General abdominal pain  EXAM: ACUTE ABDOMEN SERIES (ABDOMEN 2 VIEW & CHEST 1 VIEW)  COMPARISON:  Chest radiograph 12/20/2012  FINDINGS: Left-sided pacemaker with 3 continuously does unchanged. Two additional non connected leads are again demonstrated just superior to the generator pack. There is mild scarring at the lung bases which is unchanged.  There are several short air-fluid levels within the small bowel. On the supine exam, there are dilated loops of small up to 3.0 cm which is decreased from 3.5 cm on prior. There is gas and stool in the rectum. No intraperitoneal free air on the supine exam.  IMPRESSION: Improvement in small bowel obstruction versus ileus pattern. No evidence of high-grade obstruction.   Electronically Signed   By: Genevive Bi M.D.   On: 12/22/2012 19:16    Review of Systems  Constitutional: Negative.   HENT: Negative.   Eyes: Negative.   Respiratory: Negative.   Cardiovascular: Negative.   Gastrointestinal: Positive for nausea, vomiting and abdominal pain. Negative for diarrhea, constipation and blood in stool.  Genitourinary: Negative.   Musculoskeletal:       Pain and swelling in right groin  Skin: Negative.   Neurological: Negative.   Endo/Heme/Allergies: Negative.   Psychiatric/Behavioral: Negative.    Blood pressure 105/44, pulse 83, temperature 98 F (36.7 C), temperature  source Oral, resp. rate 18, height 5' 8.5" (1.74 m), weight 140 lb 14 oz (63.9 kg), SpO2 94.00%. Physical Exam  Constitutional: She is oriented to person, place, and time. She appears well-developed. No distress.  HENT:  Head: Normocephalic and atraumatic.  Right Ear: External ear normal.  Left Ear: External ear normal.  Eyes: Conjunctivae are normal. Pupils are equal, round, and reactive to light. No scleral icterus.  Neck: Normal range of motion. Neck supple. No thyromegaly present.  Cardiovascular: Normal rate, regular rhythm and normal heart sounds.   Respiratory: Effort normal and breath sounds normal. No respiratory distress.  GI: She exhibits distension. She exhibits no mass. There is tenderness. There is no rebound and no guarding.  Mild distension; few BS present; diffuse very mild tenderness without guarding or rebound  Genitourinary:  Well-healed right inguinal incison; firm "mass" at level  of inguinal ligament, not reducible, minimally tender, not mobile  Musculoskeletal: Normal range of motion. She exhibits no edema.  Lymphadenopathy:    She has no cervical adenopathy.  Neurological: She is alert and oriented to person, place, and time.  Skin: Skin is warm and dry. She is not diaphoretic.  Psychiatric: She has a normal mood and affect. Her behavior is normal.    Assessment/Plan: 1.  Small bowel obstruction versus ileus  Clinically improving, although NG is still in and has mildly feculent appearing output  Agree with NPO, IVF, and NG decompression  Will order repeat AXR in AM 10/19 2.  Status post [facility] repair with mesh  CTA from 12/14/2012 reviewed  Does not appear to be point of obstruction  Dr. Abbey Chatters to follow up and discuss any potential interventions Will follow with you.  Thank you for request for consultation.  Velora Heckler, MD, [facility] Office: [phone]   [name] Judie Petit 12/23/2012, 1:38 PM

## 2012-12-23 NOTE — Progress Notes (Signed)
This shift pt c/o increased belching after having 1/2 cup ice chips. Now c/o nausea and states has had 3 vomitting episodes.  Approximately 200 ml and light brown in color. Zofran provided, will continue to monitor.

## 2012-12-23 NOTE — Progress Notes (Signed)
NG tube inserted via right nare, drained . NG to LIWS per order. Will continue to monitor.

## 2012-12-23 NOTE — Progress Notes (Signed)
[facility] Progress Note   Subjective  nausea and vomiting this am.    Objective   Vital signs in last 24 hours: Temp:  [98 F (36.7 C)-98.6 F (37 C)] 98 F (36.7 C) (10/18 0929) Pulse Rate:  [62-83] 83 (10/18 0929) Resp:  [11-22] 18 (10/18 0929) BP: (98-145)/(44-66) 105/44 mmHg (10/18 0929) SpO2:  [93 %-98 %] 94 % (10/18 0929) Weight:  [138 lb (62.596 kg)-140 lb 14 oz (63.9 kg)] 140 lb 14 oz (63.9 kg) (10/17 1632) Last BM Date: 12/19/12 General:    white female in NAD Heart:  Regular rate and rhythm; no murmurs Lungs: Respirations even and unlabored, lungs CTA bilaterally Abdomen:  Soft, nontender, mildly distended. Hypoactive bowel sounds. Right femoral nontender, bulge. Extremities:  Without edema. Neurologic:  Alert and oriented,  grossly normal neurologically. Psych:  Cooperative. Normal mood and affect.  Lab Results:  Recent Labs  12/20/12 2320 12/20/12 2330 12/22/12 1810 12/23/12 0510  WBC 15.0*  --  15.4* 12.0*  HGB 15.3* 16.3* 14.8 15.2*  HCT 43.0 48.0* 41.3 41.9  PLT 227  --  197 194   BMET  Recent Labs  12/20/12 2330 12/22/12 1810 12/23/12 0510  NA 131* 124* 125*  K 3.9 3.7 4.0  CL 97 86* 89*  CO2  --  25 21  GLUCOSE 112* 94 82  BUN 15 30* 34*  CREATININE 0.80 1.16* 0.97  CALCIUM  --  9.5 9.1   LFT  Recent Labs  12/20/12 2320 12/22/12 1810  PROT 7.1 7.1  ALBUMIN 4.1 3.8  AST 24 39*  ALT 15 21  ALKPHOS 90 78  BILITOT 0.9 1.1  BILIDIR 0.2  --   IBILI 0.7  --     Studies/Results: Acute Abdominal Series  12/22/2012   CLINICAL DATA:  General abdominal pain  EXAM: ACUTE ABDOMEN SERIES (ABDOMEN 2 VIEW & CHEST 1 VIEW)  COMPARISON:  Chest radiograph 12/20/2012  FINDINGS: Left-sided pacemaker with 3 continuously does unchanged. Two additional non connected leads are again demonstrated just superior to the generator pack. There is mild scarring at the lung bases which is unchanged.  There are several short air-fluid levels  within the small bowel. On the supine exam, there are dilated loops of small up to 3.0 cm which is decreased from 3.5 cm on prior. There is gas and stool in the rectum. No intraperitoneal free air on the supine exam.  IMPRESSION: Improvement in small bowel obstruction versus ileus pattern. No evidence of high-grade obstruction.   Electronically Signed   By: Genevive Bi M.D.   On: 12/22/2012 19:16     Assessment / Plan:   Partial SBO vrs ileus. Still with episodes of vomiting this am though abdominal films last evening show improvement. Unable to do CTscan until patient secondary to nausea and vomiting. Patient has right inguinal hernia repair in July of this year. CTscan on 10/6 shows recurrent inguinal hernia.  She has a mass in right inguinal area  (well below right inguinal hernia repair incision) which has been present for a year. Mass felt by surgery to be a lipoma. There is now question of whether it is actually a femoral hernia.  We are going to need to convert home meds to IV given ongoing nausea / vomiting. Will insert NG tube for decompression. Surgical consult placed.   AKI. Improved. Creatinine 1.16 last evening, down to 0.97 today. Still may be a little dry, BUN at 34.  Her BP is soft. Will bolus with  IVF and increase IV rate to 147ml/hr. Recheck am BMET  Multiple medical problems including but no limited to dilated cardiomyopathy, LBBB, HTN, CHF, thyroid disease, melanoma,and anxiety / depression. Patient is followed by [facility] for chronic CHF / nonischemic cardiomyopathy. EF 45-50%. Patient is s/p biventricular pacemaker. Her ICD was revised to just pacemaker earlier this year. Will check Digoxin level, not sure about her absorption over last several days of vomiting. Change Digoxin to IV form.      LOS: 1 day   Willette Cluster  12/23/2012, 9:43 AM  Attending MD note:   I have reviewed the above note, examined the patient , reviewed the KUB from last night. On going vomiting  due to SBO. Abdomen mildly distended with paucity of bowl sounds. Fullness in right groin.I supect right femoral hernis. CT scan   Suggests recurrent hernia, no evidence for metastatic disease ( melanoma). I have Asked Dr Gerrit Friends to see pt  for surgical consult. Ng tube for decompression, Monitor K+, Dig levels, bowl rest.Repeat KUB in am.  [facility] Pager # [phone]

## 2012-12-24 ENCOUNTER — Inpatient Hospital Stay (HOSPITAL_COMMUNITY): Payer: Medicare Other

## 2012-12-24 ENCOUNTER — Encounter (HOSPITAL_COMMUNITY): Payer: Self-pay | Admitting: Radiology

## 2012-12-24 DIAGNOSIS — E440 Moderate protein-calorie malnutrition: Secondary | ICD-10-CM | POA: Diagnosis present

## 2012-12-24 LAB — BASIC METABOLIC PANEL
BUN: 35 mg/dL — ABNORMAL HIGH (ref 6–23)
CO2: 20 mEq/L (ref 19–32)
Calcium: 9.1 mg/dL (ref 8.4–10.5)
Creatinine, Ser: 0.91 mg/dL (ref 0.50–1.10)
GFR calc Af Amer: 68 mL/min — ABNORMAL LOW (ref >90)
GFR calc non Af Amer: 59 mL/min — ABNORMAL LOW (ref >90)

## 2012-12-24 MED ORDER — IOHEXOL 300 MG/ML  SOLN
100.0000 mL | Freq: Once | INTRAMUSCULAR | Status: AC | PRN
  Administered 2012-12-24: 100 mL via INTRAVENOUS

## 2012-12-24 MED ORDER — IOHEXOL 300 MG/ML  SOLN
50.0000 mL | Freq: Once | INTRAMUSCULAR | Status: AC | PRN
  Administered 2012-12-24: 50 mL via ORAL

## 2012-12-24 NOTE — Progress Notes (Signed)
[name] Progress Note   Subjective  NG tube uncomfortable, causes her right ear to hurt when she swallows, abdomen feels better, not hungry   Objective  Distal SBO vs ileus, 2 months post right inguinal hernia repair, KUB this morning shows further improvement in fluid levels but  Still looks like partial obstruction, NG tube output appears feculent- 3000cc out, abdomen soft, no flatus,Labs show dehydration, will increase IV fluids Vital signs in last 24 hours: Temp:  [97.8 F (36.6 C)-98.1 F (36.7 C)] 98.1 F (36.7 C) 01/13/2023 0615) Pulse Rate:  [72-78] 78 01/13/2023 0615) Resp:  [16-18] 18 13-Jan-2023 0615) BP: (112-125)/(62-67) 123/67 mmHg 2023-01-13 0615) SpO2:  [93 %-96 %] 96 % Jan 13, 2023 0615) Last BM Date: 12/19/12 General:    white female in NAD Heart:  Regular rate and rhythm; no murmurs Lungs: Respirations even and unlabored, lungs CTA bilaterally Abdomen:  Soft, nontender and nondistended. Normal bowel sounds.NG tube in place, nontender swelling right groin Extremities:  Without edema. Neurologic:  Alert and oriented,  grossly normal neurologically. Psych:  Cooperative. Normal mood and affect.  Intake/Output from previous day: 10/18 0701 - 2023-01-13 0700 In: 1611.7 [I.V.:1611.7] Out: 3120 [Emesis/NG output:3120] Intake/Output this shift:    Lab Results:  Recent Labs  12/22/12 1810 12/23/12 0510  WBC 15.4* 12.0*  HGB 14.8 15.2*  HCT 41.3 41.9  PLT 197 194   BMET  Recent Labs  12/22/12 1810 12/23/12 0510 Jan 12, 2013 0455  NA 124* 125* 132*  K 3.7 4.0 3.9  CL 86* 89* 96  CO2 25 21 20   GLUCOSE 94 82 65*  BUN 30* 34* 35*  CREATININE 1.16* 0.97 0.91  CALCIUM 9.5 9.1 9.1   LFT  Recent Labs  12/22/12 1810  PROT 7.1  ALBUMIN 3.8  AST 39*  ALT 21  ALKPHOS 78  BILITOT 1.1   PT/INR No results found for this basename: LABPROT, INR,  in the last 72 hours  Studies/Results: Dg Abd 2 Views  01/12/2013   CLINICAL DATA:  Small bowel obstruction versus  adynamic ileus.  EXAM: ABDOMEN - 2 VIEW  COMPARISON:  12/22/2012  FINDINGS: There are few air-fluid levels on the erect radiograph, and mildly prominent loops of small bowel. Stool and air is seen within a nondilated colon. There is no free air.  A nasogastric tube passes below the diaphragm to curl within the proximal stomach.  IMPRESSION: 1. Mild prominence of small bowel loops with air-fluid levels. This has shown further improvement from the prior exam and may reflect a residual, low grade partial small bowel obstruction or, more likely, an adynamic ileus. 2. No free air. No new abnormality.   Electronically Signed   By: Amie Portland M.D.   On: Jan 12, 2013 08:13   Acute Abdominal Series  12/22/2012   CLINICAL DATA:  General abdominal pain  EXAM: ACUTE ABDOMEN SERIES (ABDOMEN 2 VIEW & CHEST 1 VIEW)  COMPARISON:  Chest radiograph 12/20/2012  FINDINGS: Left-sided pacemaker with 3 continuously does unchanged. Two additional non connected leads are again demonstrated just superior to the generator pack. There is mild scarring at the lung bases which is unchanged.  There are several short air-fluid levels within the small bowel. On the supine exam, there are dilated loops of small up to 3.0 cm which is decreased from 3.5 cm on prior. There is gas and stool in the rectum. No intraperitoneal free air on the supine exam.  IMPRESSION: Improvement in small bowel obstruction versus ileus pattern. No evidence of high-grade obstruction.  Electronically Signed   By: Genevive Bi M.D.   On: 12/22/2012 19:16       Assessment:  Persistent  Although improved SBO, vs ileus, large volume small bowl output via  NG tube,, need to increase IV fluids, and recheck KUB tomorrow, Surgery follows with [place]     Plan:  See orders  Active Problems:   Unspecified intestinal obstruction     LOS: 2 days   Lina Sar  12/24/2012, 9:56 AM

## 2012-12-24 NOTE — Progress Notes (Signed)
Patient ID: [name], female   DOB: Sep 24, 1932, 79 y.o.   MRN: [id number] [facility] Progress Note:   * No surgery found *  Subjective: Mental status is clear.  NG in place with copious brownish drainage Objective: Vital signs in last 24 hours: Temp:  [97.8 F (36.6 C)-98.1 F (36.7 C)] 98.1 F (36.7 C) (10/19 0615) Pulse Rate:  [72-78] 73 (10/19 1024) Resp:  [16-18] 16 (10/19 1024) BP: (112-133)/(62-67) 133/63 mmHg (10/19 1024) SpO2:  [93 %-96 %] 94 % (10/19 1024)  Intake/Output from previous day: 10/18 0701 - 10/19 0700 In: 1611.7 [I.V.:1611.7] Out: 3120 [Emesis/NG output:3120] Intake/Output this shift:    Physical Exam: Work of breathing is normal.  Right inguinal mass is impressive.  I am concerned that this may the source of obstruction.  She is confused and frustrated about her surgery in July but despite this and the equivocal CT done earlier, I think that it would be prudent to repeat the CT.  Her abdomen is not tender and this may represent a chronic Richter's hernia in the right inguinal region.    Lab Results:  Results for orders placed during the hospital encounter of 12/22/12 (from the past 48 hour(s))  COMPREHENSIVE METABOLIC PANEL     Status: Abnormal   Collection Time    12/22/12  6:10 PM      Result Value Range   Sodium 124 (*) 135 - 145 mEq/L   Comment: DELTA CHECK NOTED   Potassium 3.7  3.5 - 5.1 mEq/L   Chloride 86 (*) 96 - 112 mEq/L   Comment: DELTA CHECK NOTED   CO2 25  19 - 32 mEq/L   Glucose, Bld 94  70 - 99 mg/dL   BUN 30 (*) 6 - 23 mg/dL   Comment: DELTA CHECK NOTED   Creatinine, Ser 1.16 (*) 0.50 - 1.10 mg/dL   Calcium 9.5  8.4 - 40.9 mg/dL   Total Protein 7.1  6.0 - 8.3 g/dL   Albumin 3.8  3.5 - 5.2 g/dL   AST 39 (*) 0 - 37 U/L   ALT 21  0 - 35 U/L   Alkaline Phosphatase 78  39 - 117 U/L   Total Bilirubin 1.1  0.3 - 1.2 mg/dL   GFR calc non Af Amer 44 (*) >90 mL/min   GFR calc Af Amer 51 (*) >90 mL/min   Comment: (NOTE)      The eGFR has been calculated using the CKD EPI equation.     This calculation has not been validated in all clinical situations.     eGFR's persistently <90 mL/min signify possible Chronic Kidney     Disease.  CBC     Status: Abnormal   Collection Time    12/22/12  6:10 PM      Result Value Range   WBC 15.4 (*) 4.0 - 10.5 K/uL   RBC 4.80  3.87 - 5.11 MIL/uL   Hemoglobin 14.8  12.0 - 15.0 g/dL   HCT 81.1  91.4 - 78.2 %   MCV 86.0  78.0 - 100.0 fL   MCH 30.8  26.0 - 34.0 pg   MCHC 35.8  30.0 - 36.0 g/dL   RDW 95.6  21.3 - 08.6 %   Platelets 197  150 - 400 K/uL  CBC     Status: Abnormal   Collection Time    12/23/12  5:10 AM      Result Value Range   WBC 12.0 (*) 4.0 -  10.5 K/uL   RBC 4.87  3.87 - 5.11 MIL/uL   Hemoglobin 15.2 (*) 12.0 - 15.0 g/dL   HCT 82.9  56.2 - 13.0 %   MCV 86.0  78.0 - 100.0 fL   MCH 31.2  26.0 - 34.0 pg   MCHC 36.3 (*) 30.0 - 36.0 g/dL   RDW 86.5  78.4 - 69.6 %   Platelets 194  150 - 400 K/uL  BASIC METABOLIC PANEL     Status: Abnormal   Collection Time    12/23/12  5:10 AM      Result Value Range   Sodium 125 (*) 135 - 145 mEq/L   Potassium 4.0  3.5 - 5.1 mEq/L   Chloride 89 (*) 96 - 112 mEq/L   CO2 21  19 - 32 mEq/L   Glucose, Bld 82  70 - 99 mg/dL   BUN 34 (*) 6 - 23 mg/dL   Creatinine, Ser 2.95  0.50 - 1.10 mg/dL   Calcium 9.1  8.4 - 28.4 mg/dL   GFR calc non Af Amer 55 (*) >90 mL/min   GFR calc Af Amer 63 (*) >90 mL/min   Comment: (NOTE)     The eGFR has been calculated using the CKD EPI equation.     This calculation has not been validated in all clinical situations.     eGFR's persistently <90 mL/min signify possible Chronic Kidney     Disease.  DIGOXIN LEVEL     Status: Abnormal   Collection Time    12/23/12 11:18 AM      Result Value Range   Digoxin Level 0.7 (*) 0.8 - 2.0 ng/mL  BASIC METABOLIC PANEL     Status: Abnormal   Collection Time    12/24/12  4:55 AM      Result Value Range   Sodium 132 (*) 135 - 145 mEq/L   Comment:  DELTA CHECK NOTED     REPEATED TO VERIFY   Potassium 3.9  3.5 - 5.1 mEq/L   Chloride 96  96 - 112 mEq/L   CO2 20  19 - 32 mEq/L   Glucose, Bld 65 (*) 70 - 99 mg/dL   BUN 35 (*) 6 - 23 mg/dL   Creatinine, Ser 1.32  0.50 - 1.10 mg/dL   Calcium 9.1  8.4 - 44.0 mg/dL   GFR calc non Af Amer 59 (*) >90 mL/min   GFR calc Af Amer 68 (*) >90 mL/min   Comment: (NOTE)     The eGFR has been calculated using the CKD EPI equation.     This calculation has not been validated in all clinical situations.     eGFR's persistently <90 mL/min signify possible Chronic Kidney     Disease.    Radiology/Results: Dg Abd 2 Views  12/24/2012   CLINICAL DATA:  Small bowel obstruction versus adynamic ileus.  EXAM: ABDOMEN - 2 VIEW  COMPARISON:  12/22/2012  FINDINGS: There are few air-fluid levels on the erect radiograph, and mildly prominent loops of small bowel. Stool and air is seen within a nondilated colon. There is no free air.  A nasogastric tube passes below the diaphragm to curl within the proximal stomach.  IMPRESSION: 1. Mild prominence of small bowel loops with air-fluid levels. This has shown further improvement from the prior exam and may reflect a residual, low grade partial small bowel obstruction or, more likely, an adynamic ileus. 2. No free air. No new abnormality.   Electronically Signed   By:  Amie Portland M.D.   On: 12/24/2012 08:13   Acute Abdominal Series  12/22/2012   CLINICAL DATA:  General abdominal pain  EXAM: ACUTE ABDOMEN SERIES (ABDOMEN 2 VIEW & CHEST 1 VIEW)  COMPARISON:  Chest radiograph 12/20/2012  FINDINGS: Left-sided pacemaker with 3 continuously does unchanged. Two additional non connected leads are again demonstrated just superior to the generator pack. There is mild scarring at the lung bases which is unchanged.  There are several short air-fluid levels within the small bowel. On the supine exam, there are dilated loops of small up to 3.0 cm which is decreased from 3.5 cm on prior.  There is gas and stool in the rectum. No intraperitoneal free air on the supine exam.  IMPRESSION: Improvement in small bowel obstruction versus ileus pattern. No evidence of high-grade obstruction.   Electronically Signed   By: Genevive Bi M.D.   On: 12/22/2012 19:16    Anti-infectives: Anti-infectives   None      Assessment/Plan: Problem List: Patient Active Problem List   Diagnosis Date Noted  . Unspecified intestinal obstruction 12/23/2012  . Nausea with vomiting 12/22/2012  . Abdominal  pain, other specified site 12/22/2012  . Hypothyroidism   . Hair thinning   . Anxiety disorder   . Other primary cardiomyopathies   . Unspecified sinusitis (chronic)   . Unspecified chronic bronchitis   . Depressive disorder, not elsewhere classified   . Restless legs syndrome (RLS)   . Chronic airway obstruction, not elsewhere classified   . Inguinal hernia without mention of obstruction or gangrene, unilateral or unspecified, (not specified as recurrent)-right 08/02/2012  . PVC (premature ventricular contraction) 06/08/2011  . ANXIETY 02/03/2009  . GERD 02/03/2009  . CHRONIC SYSTOLIC HEART FAILURE 12/12/2008  . ICD-[employer] 12/10/2008  . BARRETT'S ESOPHAGUS 05/29/2008  . GASTRITIS 05/29/2008  . OTHER SPECIFIED DISORDER OF STOMACH AND DUODENUM 04/29/2008  . SLEEP APNEA 04/08/2008  . ABDOMINAL PAIN, EPIGASTRIC 04/08/2008  . NONSPECIFIC ABN FINDING RAD & OTH EXAM GI TRACT 04/08/2008    Will request repeat CT scan with oral contrast per NG tube.  * No surgery found *    LOS: 2 days   Matt B. Daphine Deutscher, MD, [facility] [phone] beeper [phone]  12/24/2012 10:26 AM

## 2012-12-24 NOTE — Progress Notes (Signed)
CT scan of the abdomen/pelvis  Compared with 12/2012 now shows a segment of distal ileum within the inguinal hernia with a narrow segment in the hernia  and proximal dilation. Status unchanged, Dr Daphine Deutscher to review.

## 2012-12-24 NOTE — Progress Notes (Signed)
INITIAL NUTRITION ASSESSMENT  DOCUMENTATION CODES Per approved criteria  -Non-severe (moderate) malnutrition in the context of chronic illness  Pt meets criteria for moderate MALNUTRITION in the context of chronic illness as evidenced by Nutrition-Focused Physical Exam findings and reported intake < estimated needs for >1 month.  Nutrition Focused Physical Exam:  Subcutaneous Fat:  Orbital Region: severe wasting Upper Arm Region: moderate wasting Thoracic and Lumbar Region: n/a  Muscle:  Temple Region: severe wasting Clavicle Bone Region: moderate wasting Clavicle and Acromion Bone Region: moderate wasting Scapular Bone Region: n/a Dorsal Hand: moderate wasting Patellar Region: n/a Anterior Thigh Region: n/a Posterior Calf Region: n/a  INTERVENTION: - Once diet upgraded, add Ensure Complete po BID, each supplement provides 350 kcal and 13 grams of protein. - Pt was educated on ways to increase calories and protein at home to aid in weight gain. - Diet to be upgraded as medically appropriate  per MD discretion.  NUTRITION DIAGNOSIS: Inadequate oral intake related to inability to eat as evidenced by NPO.   Goal: Pt to meet >/= 90% of their estimated nutrition needs   Monitor:  I/O's, weight, diet advancement, labs, acceptance of supplements once diet advanced  Reason for Assessment: MST  79 y.o. female  Admitting Dx: <principal problem not specified>  ASSESSMENT: Pt admitted with nausea and vomiting. SBO vs ileus.   Pt reports that she wants food and drink. She reports that he intake has been poor and that she suspects weight loss, but is unable to confirm due to fluid retention. She was given tips on how to increase protein and calories in her diet upon discharge. She is concerned that she is to thin and wants to gain weight. Add ensure complete BID once diet advanced.  Height: Ht Readings from Last 1 Encounters:  12/22/12 5' 8.5" (1.74 m)    Weight: Wt Readings  from Last 1 Encounters:  12/22/12 140 lb 14 oz (63.9 kg)    Ideal Body Weight: 65.0 kg  % Ideal Body Weight: 98%  Wt Readings from Last 10 Encounters:  12/22/12 140 lb 14 oz (63.9 kg)  12/22/12 138 lb (62.596 kg)  12/22/12 140 lb (63.504 kg)  12/20/12 137 lb (62.143 kg)  12/06/12 139 lb (63.05 kg)  11/21/12 139 lb 8 oz (63.277 kg)  11/14/12 138 lb 6.4 oz (62.778 kg)  11/01/12 139 lb (63.05 kg)  10/13/12 141 lb 6.4 oz (64.139 kg)  10/10/12 140 lb 12.8 oz (63.866 kg)    Usual Body Weight: 140 lbs  % Usual Body Weight: 100%, due to fluid retention  BMI:  Body mass index is 21.11 kg/(m^2).  Estimated Nutritional Needs: Kcal: 1650-1900 Protein: 75-90 g Fluid: >1.7 L  Skin: WNL  Diet Order:    EDUCATION NEEDS: -Education needs addressed   Intake/Output Summary (Last 24 hours) at 12/24/12 1606 Last data filed at 12/24/12 0700  Gross per 24 hour  Intake 1611.67 ml  Output   1150 ml  Net 461.67 ml    Last BM: PTA   Labs:   Recent Labs Lab 12/22/12 1810 12/23/12 0510 12/24/12 0455  NA 124* 125* 132*  K 3.7 4.0 3.9  CL 86* 89* 96  CO2 25 21 20   BUN 30* 34* 35*  CREATININE 1.16* 0.97 0.91  CALCIUM 9.5 9.1 9.1  GLUCOSE 94 82 65*    CBG (last 3)  No results found for this basename: GLUCAP,  in the last 72 hours  Scheduled Meds: . digoxin  0.125 mg Intravenous  Daily  . heparin  5,000 Units Subcutaneous Q8H  . levothyroxine  50 mcg Intravenous Daily  . metoprolol  5 mg Intravenous Daily    Continuous Infusions: . 0.9 % NaCl with KCl 20 mEq / L 125 mL/hr at 12/24/12 1108    Past Medical History  Diagnosis Date  . Nonischemic cardiomyopathy     EF initially 20%; Last measurement up to 50% per echo in August of 2012  . CHF (congestive heart failure)   . LBBB (left bundle branch block)   . Mitral regurgitation   . Melanoma     right arm  . Hypothyroidism   . Erosive gastritis 08/2004  . Hyperplastic polyps of stomach 06/2002  . Barrett's  esophagus 04/2008  . GERD (gastroesophageal reflux disease)   . Diverticulitis   . Anxiety disorder   . HTN (hypertension)   . ICD (implantable cardiac defibrillator) in place     BiV/ICD; s/p removal of ICD with insertion of BiV PPM 04/26/12  . Hair thinning   . Unspecified sinusitis (chronic)   . Unspecified chronic bronchitis   . Depressive disorder, not elsewhere classified   . Restless legs syndrome (RLS)   . Chronic airway obstruction, not elsewhere classified   . Benign neoplasm of colon   . Other and unspecified hyperlipidemia   . Unspecified nasal polyp   . Pain in joint, lower leg   . Synovial cyst of popliteal space   . Complication of anesthesia     slow to wake up  . Pacemaker 04/26/2012  . Sleep apnea   . Other and unspecified hyperlipidemia   . Dysthymic disorder   . Palpitations     Past Surgical History  Procedure Laterality Date  . Defibrillator insertion      s/p removal of previously implanted BiV ICD and insertion of a new BiV pacemaker on 04/26/12  . Left arm surgery    . Abdominal hysterectomy  1076    endometriosis  . Right breast lumpectomy  1988    Dr. Francina Ames  . Appendectomy    . Tonsillectomy    . Right knee surgery      arthroscopy: Dr. Simonne Come  . Excision of wen  1984    Dr. Valrie Hart  . Excise mole of lip  2001    Dr. Everlene Farrier  . Inguinal hernia repair Right 09/26/2012    Procedure: RIGHT INGUINAL HERNIA REPAIR WITH MESH;  Surgeon: Adolph Pollack, MD;  Location: [facility] OR;  Service: General;  Laterality: Right;  . Insertion of mesh Right 09/26/2012    Procedure: INSERTION OF MESH;  Surgeon: Adolph Pollack, MD;  Location: [facility] OR;  Service: General;  Laterality: Right;    Ebbie Latus RD, LDN

## 2012-12-25 ENCOUNTER — Other Ambulatory Visit: Payer: Self-pay

## 2012-12-25 ENCOUNTER — Encounter (HOSPITAL_COMMUNITY): Payer: Self-pay | Admitting: Certified Registered Nurse Anesthetist

## 2012-12-25 ENCOUNTER — Encounter (HOSPITAL_COMMUNITY): Payer: Medicare Other | Admitting: Anesthesiology

## 2012-12-25 ENCOUNTER — Telehealth: Payer: Self-pay

## 2012-12-25 ENCOUNTER — Inpatient Hospital Stay (HOSPITAL_COMMUNITY): Payer: Medicare Other | Admitting: Anesthesiology

## 2012-12-25 ENCOUNTER — Encounter (HOSPITAL_COMMUNITY): Admission: AD | Disposition: A | Discharge status: Skilled Nursing Facility | Payer: Self-pay | Source: Ambulatory Visit

## 2012-12-25 DIAGNOSIS — K4140 Unilateral femoral hernia, with gangrene, not specified as recurrent: Secondary | ICD-10-CM | POA: Diagnosis present

## 2012-12-25 DIAGNOSIS — IMO0001 Reserved for inherently not codable concepts without codable children: Secondary | ICD-10-CM

## 2012-12-25 HISTORY — PX: INGUINAL HERNIA REPAIR: SHX194

## 2012-12-25 HISTORY — PX: FEMORAL HERNIA REPAIR: SHX632

## 2012-12-25 LAB — BASIC METABOLIC PANEL
BUN: 26 mg/dL — ABNORMAL HIGH (ref 6–23)
Calcium: 8.5 mg/dL (ref 8.4–10.5)
Creatinine, Ser: 0.75 mg/dL (ref 0.50–1.10)
GFR calc Af Amer: >90 mL/min (ref >90)
GFR calc non Af Amer: 79 mL/min — ABNORMAL LOW (ref >90)
Glucose, Bld: 56 mg/dL — ABNORMAL LOW (ref 70–99)
Potassium: 4.3 mEq/L (ref 3.5–5.1)

## 2012-12-25 LAB — CBC
HCT: 40.8 % (ref 36.0–46.0)
Hemoglobin: 14.1 g/dL (ref 12.0–15.0)
MCH: 30.8 pg (ref 26.0–34.0)
MCV: 89.1 fL (ref 78.0–100.0)
RBC: 4.58 MIL/uL (ref 3.87–5.11)
RDW: 13 % (ref 11.5–15.5)
WBC: 4.5 10*3/uL (ref 4.0–10.5)

## 2012-12-25 LAB — HIV RAPID SCREEN (BLD OR BODY FLD EXPOSURE): Rapid HIV Screen: NONREACTIVE

## 2012-12-25 SURGERY — REPAIR, HERNIA, INGUINAL, ADULT
Anesthesia: General | Site: Groin | Laterality: Right | Wound class: Clean Contaminated

## 2012-12-25 MED ORDER — ENOXAPARIN SODIUM 40 MG/0.4ML ~~LOC~~ SOLN
40.0000 mg | SUBCUTANEOUS | Status: DC
  Administered 2012-12-26 – 2013-01-04 (×10): 40 mg via SUBCUTANEOUS
  Filled 2012-12-25 (×10): qty 0.4

## 2012-12-25 MED ORDER — LACTATED RINGERS IV SOLN
INTRAVENOUS | Status: DC | PRN
  Administered 2012-12-25 (×3): via INTRAVENOUS

## 2012-12-25 MED ORDER — GLYCOPYRROLATE 0.2 MG/ML IJ SOLN
INTRAMUSCULAR | Status: DC | PRN
  Administered 2012-12-25: .6 mg via INTRAVENOUS

## 2012-12-25 MED ORDER — LIDOCAINE HCL (CARDIAC) 20 MG/ML IV SOLN
INTRAVENOUS | Status: DC | PRN
  Administered 2012-12-25: 50 mg via INTRAVENOUS

## 2012-12-25 MED ORDER — DEXAMETHASONE SODIUM PHOSPHATE 10 MG/ML IJ SOLN
INTRAMUSCULAR | Status: DC | PRN
  Administered 2012-12-25: 10 mg via INTRAVENOUS

## 2012-12-25 MED ORDER — CHLORHEXIDINE GLUCONATE 4 % EX LIQD
1.0000 "application " | Freq: Once | CUTANEOUS | Status: DC
  Filled 2012-12-25: qty 15

## 2012-12-25 MED ORDER — NEOSTIGMINE METHYLSULFATE 1 MG/ML IJ SOLN
INTRAMUSCULAR | Status: DC | PRN
  Administered 2012-12-25: 4 mg via INTRAVENOUS

## 2012-12-25 MED ORDER — SODIUM CHLORIDE 0.9 % IV SOLN
1.0000 g | INTRAVENOUS | Status: AC
  Administered 2012-12-26: 1 g via INTRAVENOUS
  Filled 2012-12-25: qty 1

## 2012-12-25 MED ORDER — HYDROMORPHONE HCL PF 1 MG/ML IJ SOLN
0.2500 mg | INTRAMUSCULAR | Status: DC | PRN
  Administered 2012-12-25 (×2): 0.5 mg via INTRAVENOUS

## 2012-12-25 MED ORDER — DEXTROSE 5 % IV SOLN: 2.0000 g | INTRAVENOUS | Status: DC

## 2012-12-25 MED ORDER — SODIUM CHLORIDE 0.9 % IV SOLN
1.0000 g | INTRAVENOUS | Status: AC
  Administered 2012-12-25: 1 g via INTRAVENOUS
  Filled 2012-12-25: qty 1

## 2012-12-25 MED ORDER — HYDROMORPHONE HCL PF 1 MG/ML IJ SOLN
INTRAMUSCULAR | Status: AC
  Filled 2012-12-25: qty 1

## 2012-12-25 MED ORDER — MIDAZOLAM HCL 5 MG/5ML IJ SOLN
INTRAMUSCULAR | Status: DC | PRN
  Administered 2012-12-25: .5 mg via INTRAVENOUS

## 2012-12-25 MED ORDER — PROMETHAZINE HCL 25 MG/ML IJ SOLN: 6.2500 mg | INTRAMUSCULAR | Status: DC | PRN

## 2012-12-25 MED ORDER — FENTANYL CITRATE 0.05 MG/ML IJ SOLN
INTRAMUSCULAR | Status: DC | PRN
  Administered 2012-12-25 (×2): 100 ug via INTRAVENOUS
  Administered 2012-12-25 (×3): 50 ug via INTRAVENOUS
  Administered 2012-12-25: 100 ug via INTRAVENOUS
  Administered 2012-12-25: 50 ug via INTRAVENOUS

## 2012-12-25 MED ORDER — ONDANSETRON HCL 4 MG/2ML IJ SOLN
INTRAMUSCULAR | Status: DC | PRN
  Administered 2012-12-25 (×2): 2 mg via INTRAMUSCULAR

## 2012-12-25 MED ORDER — CISATRACURIUM BESYLATE (PF) 10 MG/5ML IV SOLN
INTRAVENOUS | Status: DC | PRN
  Administered 2012-12-25: 6 mg via INTRAVENOUS
  Administered 2012-12-25: 2 mg via INTRAVENOUS

## 2012-12-25 MED ORDER — PROPOFOL 10 MG/ML IV BOLUS
INTRAVENOUS | Status: DC | PRN
  Administered 2012-12-25: 125 mg via INTRAVENOUS

## 2012-12-25 MED ORDER — NEOSTIGMINE METHYLSULFATE 1 MG/ML IJ SOLN: INTRAMUSCULAR | Status: DC | PRN

## 2012-12-25 MED ORDER — SUCCINYLCHOLINE CHLORIDE 20 MG/ML IJ SOLN
INTRAMUSCULAR | Status: DC | PRN
  Administered 2012-12-25: 100 mg via INTRAVENOUS

## 2012-12-25 MED ORDER — POTASSIUM CHLORIDE IN NACL 20-0.9 MEQ/L-% IV SOLN
INTRAVENOUS | Status: DC
  Administered 2012-12-25 (×2): via INTRAVENOUS
  Administered 2012-12-26: 100 mL/h via INTRAVENOUS
  Administered 2012-12-26: 08:00:00 via INTRAVENOUS
  Administered 2012-12-27: 100 mL/h via INTRAVENOUS
  Administered 2012-12-27 – 2012-12-30 (×5): via INTRAVENOUS
  Filled 2012-12-25 (×14): qty 1000

## 2012-12-25 MED ORDER — MEPERIDINE HCL 50 MG/ML IJ SOLN: 6.2500 mg | INTRAMUSCULAR | Status: DC | PRN

## 2012-12-25 SURGICAL SUPPLY — 56 items
BENZOIN TINCTURE PRP APPL 2/3 (GAUZE/BANDAGES/DRESSINGS) IMPLANT
BLADE HEX COATED 2.75 (ELECTRODE) ×2 IMPLANT
BLADE SURG 15 STRL LF DISP TIS (BLADE) ×1 IMPLANT
BLADE SURG 15 STRL SS (BLADE) ×1
CABLE HIGH FREQUENCY MONO STRZ (ELECTRODE) ×2 IMPLANT
CANISTER SUCTION 2500CC (MISCELLANEOUS) ×2 IMPLANT
CHLORAPREP W/TINT 26ML (MISCELLANEOUS) ×2 IMPLANT
CLOTH BEACON ORANGE TIMEOUT ST (SAFETY) ×2 IMPLANT
DECANTER SPIKE VIAL GLASS SM (MISCELLANEOUS) IMPLANT
DERMABOND ADVANCED (GAUZE/BANDAGES/DRESSINGS)
DERMABOND ADVANCED .7 DNX12 (GAUZE/BANDAGES/DRESSINGS) IMPLANT
DISSECTOR ROUND CHERRY 3/8 STR (MISCELLANEOUS) ×2 IMPLANT
DRAIN PENROSE 18X1/2 LTX STRL (DRAIN) IMPLANT
DRAPE LAPAROTOMY TRNSV 102X78 (DRAPE) IMPLANT
DRESSING TELFA ISLAND 4X8 (GAUZE/BANDAGES/DRESSINGS) ×2 IMPLANT
ELECT REM PT RETURN 9FT ADLT (ELECTROSURGICAL) ×2
ELECTRODE REM PT RTRN 9FT ADLT (ELECTROSURGICAL) ×1 IMPLANT
GLOVE EUDERMIC 7 POWDERFREE (GLOVE) IMPLANT
GOWN STRL REIN XL XLG (GOWN DISPOSABLE) ×6 IMPLANT
KIT BASIN OR (CUSTOM PROCEDURE TRAY) ×2 IMPLANT
LIGASURE LAP ATLAS 10MM 37CM (INSTRUMENTS) ×2 IMPLANT
NEEDLE HYPO 25X1 1.5 SAFETY (NEEDLE) ×2 IMPLANT
NS IRRIG 1000ML POUR BTL (IV SOLUTION) ×2 IMPLANT
PACK BASIC VI WITH GOWN DISP (CUSTOM PROCEDURE TRAY) ×2 IMPLANT
PENCIL BUTTON HOLSTER BLD 10FT (ELECTRODE) ×2 IMPLANT
RELOAD LINEAR CUT PROX 55 BLUE (ENDOMECHANICALS) ×4 IMPLANT
SPONGE GAUZE 4X4 12PLY (GAUZE/BANDAGES/DRESSINGS) IMPLANT
SPONGE LAP 4X18 X RAY DECT (DISPOSABLE) ×2 IMPLANT
STAPLER GUN LINEAR PROX 60 (STAPLE) ×2 IMPLANT
STAPLER PROXIMATE 55 BLUE (STAPLE) ×2 IMPLANT
STAPLER VISISTAT 35W (STAPLE) IMPLANT
STRIP CLOSURE SKIN 1/2X4 (GAUZE/BANDAGES/DRESSINGS) IMPLANT
SUT MNCRL AB 4-0 PS2 18 (SUTURE) ×2 IMPLANT
SUT PROLENE 0 CT 2 (SUTURE) ×8 IMPLANT
SUT PROLENE 2 0 CT2 30 (SUTURE) IMPLANT
SUT SILK 2 0 (SUTURE) ×1
SUT SILK 2 0 SH (SUTURE) IMPLANT
SUT SILK 2-0 18XBRD TIE 12 (SUTURE) ×1 IMPLANT
SUT SILK 3 0 SH CR/8 (SUTURE) ×2 IMPLANT
SUT VIC AB 2-0 CT1 18 (SUTURE) ×2 IMPLANT
SUT VIC AB 2-0 SH 27 (SUTURE) ×1
SUT VIC AB 2-0 SH 27X BRD (SUTURE) ×1 IMPLANT
SUT VIC AB 3-0 54XBRD REEL (SUTURE) ×1 IMPLANT
SUT VIC AB 3-0 BRD 54 (SUTURE) ×1
SUT VIC AB 3-0 SH 18 (SUTURE) ×2 IMPLANT
SUT VIC AB 3-0 SH 27 (SUTURE) ×1
SUT VIC AB 3-0 SH 27XBRD (SUTURE) ×1 IMPLANT
SUT VICRYL 2 0 18  UND BR (SUTURE)
SUT VICRYL 2 0 18 UND BR (SUTURE) IMPLANT
SYR 20CC LL (SYRINGE) IMPLANT
SYR BULB IRRIGATION 50ML (SYRINGE) ×2 IMPLANT
TOWEL OR 17X26 10 PK STRL BLUE (TOWEL DISPOSABLE) ×4 IMPLANT
TOWEL OR NON WOVEN STRL DISP B (DISPOSABLE) ×4 IMPLANT
TRAY FOLEY CATH 16FRSI W/METER (SET/KITS/TRAYS/PACK) ×2 IMPLANT
TUBING INSUFFLATION 10FT LAP (TUBING) ×2 IMPLANT
YANKAUER SUCT BULB TIP 10FT TU (MISCELLANEOUS) ×2 IMPLANT

## 2012-12-25 NOTE — Progress Notes (Signed)
Subjective: Patient's clinical history and progress with was presented to me by Dr. Luretha Murphy and Dr. Avel Peace this morning. I have examined this patient and reviewed her CT scan. She has an incarcerated, possibly strangulated recurrent right inguinal hernia or right femoral hernia with small bowel obstruction. She will need to be taken to the operating room today.  She is alert but having some abdominal and right groin pain. I explained my approach which would be to explore the right groin, examine and reduce the bowel, resect small bowel if necessary, repair the defect that was identified, and there is a possibility of laparotomy and small bowel resection. She is aware of the indications, details, techniques, and numerous risks of the surgery. She is aware of the risk of bleeding, infection, intestinal injury and spillage, recurrence of the hernia, chronic pain or numbness, wound healing problems, and other unforeseen problems. She understands all these issues. All of her questions were answered. Second opinion was offered and declined. She will be taken to the operating room later this morning.  Objective: Vital signs in last 24 hours: Temp:  [98 F (36.7 C)-98.3 F (36.8 C)] 98.3 F (36.8 C) (10/20 0626) Pulse Rate:  [64-77] 75 (10/20 0626) Resp:  [16-18] 18 (10/20 0626) BP: (128-147)/(63-73) 129/73 mmHg (10/20 0626) SpO2:  [94 %-97 %] 94 % (10/20 0626) Last BM Date: 12/19/12  Intake/Output from previous day: 10/19 0701 - 10/20 0700 In: -  Out: 400 [Emesis/NG output:400] Intake/Output this shift:    General appearance: alert. Oriented. Mental status normal. Minimal distress. Somewhat frustrated. Resp: clear to auscultation bilaterally GI: abdomen is borderline distended, soft, but with mild tenderness. No peritoneal signs. No abdominal mass. No abdominal hernia. Tender mass right groin. Cannot tell whether this is inguinal or femoral hernia. Skin appears healthy. Too  tender to attempt reduction at this point.  Lab Results:   Recent Labs  12/22/12 1810 12/23/12 0510  WBC 15.4* 12.0*  HGB 14.8 15.2*  HCT 41.3 41.9  PLT 197 194   BMET  Recent Labs  12/24/12 0455 12/25/12 0440  NA 132* 134*  K 3.9 4.3  CL 96 100  CO2 20 18*  GLUCOSE 65* 56*  BUN 35* 26*  CREATININE 0.91 0.75  CALCIUM 9.1 8.5   PT/INR No results found for this basename: LABPROT, INR,  in the last 72 hours ABG No results found for this basename: PHART, PCO2, PO2, HCO3,  in the last 72 hours  Studies/Results: Ct Abdomen Pelvis W Contrast  12/24/2012   CLINICAL DATA:  Right inguinal mass. Radiographic evidence of a partial small bowel obstruction.  EXAM: CT ABDOMEN AND PELVIS WITH CONTRAST  TECHNIQUE: Multidetector CT imaging of the abdomen and pelvis was performed using the standard protocol following bolus administration of intravenous contrast.  CONTRAST:  50mL OMNIPAQUE IOHEXOL 300 MG/ML SOLN, OMNIPAQUE IOHEXOL 300 MG/ML SOLN  COMPARISON:  Standard radiographs of the abdomen, 12/1912. Abdomen and pelvis CT, 12/11/2012.  FINDINGS: There is a right inguinal hernia that extends anterior to the common femoral vessels. It contains a loop of small bowel. The a fair in the fairly loops portions of this herniated loops of small bowel are narrowed. This is a distal loop of ileum. The small bowel proximal to the hernia is dilated with air-fluid levels. Maximum dilation is 3 cm.  There is a small amount of ascites seen predominately adjacent to the liver and in the posterior pelvic recess. No free air.  Small right a minimal  left pleural effusions. Minor dependent subsegmental atelectasis. The lung bases are otherwise clear.  Nonspecific low-density tracks along the portal branches in the liver. The liver is otherwise unremarkable. Normal spleen. The gallbladder is unremarkable. There is no bile duct dilation. Normal pancreas. No adrenal masses.  Small stable low-density renal lesions,  likely cysts. Kidneys are otherwise unremarkable. Normal ureters. The bladder is only mildly distended. There is no bladder mass or wall thickening. The bladder neck is low consistent with a cyst seal.  The uterus is surgically absent. There are no pelvic masses. No adenopathy. Atherosclerotic changes are noted throughout the abdominal aorta and its branch vessels. There is an ectasia of the abdominal aorta.  Extensive sigmoid colon diverticulosis is noted. There are few additional scattered colonic diverticula. Stool in the colon is mildly increased. There is no colonic wall thickening or inflammatory change.  Degenerative changes are noted of the visualized spine. No osteoblastic or osteolytic lesions.  IMPRESSION: 1. A partial small bowel obstruction is caused by a right inguinal region hernia the contains a loop of distal ileum. This bowel loop is narrowed as it enters and exits the hernia confirming at as is source of the partial obstruction. 2. Small amount of ascites. 3. Small right minimal left pleural effusions. Minor associated dependent subsegmental lung base atelectasis. 4. Chronic findings include extensive sigmoid colon diverticulosis, low-density renal lesions that are likely cysts and atherosclerotic cysts and a ectasia of the double aorta.   Electronically Signed   By: Amie Portland M.D.   On: 12/24/2012 13:06   Dg Abd 2 Views  12/24/2012   CLINICAL DATA:  Small bowel obstruction versus adynamic ileus.  EXAM: ABDOMEN - 2 VIEW  COMPARISON:  12/22/2012  FINDINGS: There are few air-fluid levels on the erect radiograph, and mildly prominent loops of small bowel. Stool and air is seen within a nondilated colon. There is no free air.  A nasogastric tube passes below the diaphragm to curl within the proximal stomach.  IMPRESSION: 1. Mild prominence of small bowel loops with air-fluid levels. This has shown further improvement from the prior exam and may reflect a residual, low grade partial small bowel  obstruction or, more likely, an adynamic ileus. 2. No free air. No new abnormality.   Electronically Signed   By: Amie Portland M.D.   On: 12/24/2012 08:13    Anti-infectives: Anti-infectives   None      Assessment/Plan:   Incarcerated, possibly strangulated right groin hernia with small bowel obstruction. This may be a recurrent inguinal hernia or a femoral hernia. Repair is indicated.  See informed consent discussion above.plan operative intervention later this morning.  Anxiety disorder  Stable primary cardiomyopathy  Chronic bronchitis, stable  Restless leg syndrome   LOS: 3 days    [name] M 12/25/2012

## 2012-12-25 NOTE — Progress Notes (Signed)
Surgical plans by Dr. Derrell Lolling noted. Will transfer her care to [facility] for operative and post operative management.

## 2012-12-25 NOTE — Preoperative (Signed)
Beta Blockers   Reason not to administer Beta Blockers:Not Applicable 

## 2012-12-25 NOTE — ED Provider Notes (Signed)
Medical screening examination/treatment/procedure(s) were conducted as a shared visit with non-physician practitioner(s) and myself.  I personally evaluated the patient during the encounter Pt presents with vomiting and abd pain.  Pain is diffuse but more concentrated in the lower quadrants.  Pt had had multiple CT's for hernia but denies sx like this.  Pt does have leukocytosis today but o/w normal.  AAS showed no signs of obstruction but feel most likely needs CT to further eval.  Gwyneth Sprout, MD 12/25/12 [phone]

## 2012-12-25 NOTE — Progress Notes (Signed)
Agree with above intervention. I had discussed holding heparin with RN at 0630 as well.  [name]. Derrell Lolling, M.D., [facility] General and Minimally invasive Surgery Breast and Colorectal Surgery Office:   [phone] Pager:   [phone]

## 2012-12-25 NOTE — Anesthesia Preprocedure Evaluation (Signed)
Anesthesia Evaluation  Patient identified by MRN, date of birth, ID band Patient awake    Reviewed: Allergy & Precautions, H&P , NPO status , Patient's Chart, lab work & pertinent test results  Airway Mallampati: II TM Distance: >3 FB Neck ROM: Full    Dental  (+) Teeth Intact and Dental Advisory Given   Pulmonary sleep apnea , COPDformer smoker,  breath sounds clear to auscultation        Cardiovascular hypertension, +CHF + dysrhythmias + pacemaker + Cardiac Defibrillator + Valvular Problems/Murmurs MR Rhythm:Regular Rate:Normal     Neuro/Psych PSYCHIATRIC DISORDERS Anxiety Depression negative neurological ROS     GI/Hepatic Neg liver ROS, GERD-  Medicated,  Endo/Other  Hypothyroidism   Renal/GU negative Renal ROS     Musculoskeletal negative musculoskeletal ROS (+)   Abdominal   Peds  Hematology negative hematology ROS (+)   Anesthesia Other Findings   Reproductive/Obstetrics negative OB ROS                           Anesthesia Physical  Anesthesia Plan  ASA: III  Anesthesia Plan:    Post-op Pain Management:    Induction: Intravenous  Airway Management Planned:   Additional Equipment:   Intra-op Plan:   Post-operative Plan:   Informed Consent: I have reviewed the patients History and Physical, chart, labs and discussed the procedure including the risks, benefits and alternatives for the proposed anesthesia with the patient or authorized representative who has indicated his/her understanding and acceptance.   Dental advisory given  Plan Discussed with: CRNA  Anesthesia Plan Comments:         Anesthesia Quick Evaluation

## 2012-12-25 NOTE — Progress Notes (Signed)
Patient seen and examined.  Chart reviewed.  CT reviewed. Discussed with Dr. Derrell Lolling who is the acute care surgeon this week and he has seen her.  She has a partial SBO due to a recurrent right inguinal hernia.  We discussed the need for urgent repair.  She does not have confidence in me at this point and so will ask Dr. Derrell Lolling to manage the surgery.  She is okay with this.

## 2012-12-25 NOTE — Anesthesia Postprocedure Evaluation (Signed)
Anesthesia Post Note  Patient: [name]  Procedure(s) Performed: Procedure(s) (LRB): explor right groin, small bowel rescection, tissue repair right femoral hernia (Right)  Anesthesia type: General  Patient location: PACU  Post pain: Pain level controlled  Post assessment: Post-op Vital signs reviewed  Last Vitals: BP 153/54  Pulse 65  Temp(Src) 36.8 C (Oral)  Resp 13  Ht 5' 8.5" (1.74 m)  Wt 140 lb 14 oz (63.9 kg)  BMI 21.11 kg/m2  SpO2 100%  Post vital signs: Reviewed  Level of consciousness: sedated  Complications: No apparent anesthesia complications

## 2012-12-25 NOTE — Transfer of Care (Signed)
Immediate Anesthesia Transfer of Care Note  Patient: [name]  Procedure(s) Performed: Procedure(s): explor right groin, small bowel rescection, tissue repair right femoral hernia (Right)  Patient Location: PACU  Anesthesia Type:General  Level of Consciousness: awake, alert , oriented, patient cooperative and responds to stimulation  Airway & Oxygen Therapy: Patient Spontanous Breathing and Patient connected to face mask oxygen  Post-op Assessment: Report given to PACU RN, Post -op Vital signs reviewed and stable and Patient moving all extremities  Post vital signs: Reviewed and stable  Complications: No apparent anesthesia complications

## 2012-12-25 NOTE — Anesthesia Procedure Notes (Signed)
Procedure Name: Intubation Date/Time: 12/25/2012 12:16 PM Performed by: Edison Pace Pre-anesthesia Checklist: Emergency Drugs available, Patient identified, Timeout performed, Suction available and Patient being monitored Patient Re-evaluated:Patient Re-evaluated prior to inductionOxygen Delivery Method: Circle system utilized Preoxygenation: Pre-oxygenation with 100% oxygen Intubation Type: Cricoid Pressure applied, Rapid sequence and IV induction Laryngoscope Size: Miller and 2 Grade View: Grade II Tube type: Oral Tube size: 7.5 mm Number of attempts: 2 Airway Equipment and Method: Stylet Placement Confirmation: ETT inserted through vocal cords under direct vision,  breath sounds checked- equal and bilateral and positive ETCO2 (by MDA after esoph. by CRNA immed removed O2 sat stable. changed to straight blade by MDA                                                                                        ) Secured at: 21 cm Tube secured with: Tape Dental Injury: Teeth and Oropharynx as per pre-operative assessment  Difficulty Due To: Difficulty was anticipated and Difficult Airway- due to anterior larynx Future Recommendations: Recommend- induction with short-acting agent, and alternative techniques readily available

## 2012-12-25 NOTE — Telephone Encounter (Signed)
Sheral Apley called to let Dr. Chilton Si know that Mosella is in the hospital, that Dr. Derrell Lolling is going to do surgery.

## 2012-12-25 NOTE — Op Note (Signed)
Patient Name:           [name]   Date of Surgery:        12/25/2012  Pre op Diagnosis:      Incarcerated right groin hernia with small bowel obstruction  Post op Diagnosis:    Strangulated right femoral hernia with small bowel obstruction  Procedure:                 Exploration right groin, small bowel resection, partial omentectomy, primary tissue repair of the right femoral hernia, Cooper's ligament technique  Surgeon:                     [name]. Derrell Lolling, M.D., FACS  Assistant:                      none  Operative Indications:   This is a 79 year old female with multiple medical problems including COPD, cardia and myopathy with pacemaker in place, former tobacco abuse. She underwent open repair of a right inguinal hernia with Prolene mesh in July of this year. Her wounds healed but she did develop some swelling in the right groin. She states she's had severe pain for about 5 days. She was admitted to this hospital on October 17. She was found to have a small bowel obstruction and a nasogastric tube was placed. She was noted to have a mass in her right groin. CT scan was performed yesterday which showed an incarcerated loop of small bowel in the a right groin hernia with proximal dilatation of the small bowel. I was asked to see the patient this morning and advised that she go to the operating room promptly for explpration of the right groin, possible bowel resection, possible laparotomy.  Operative Findings:       There was a strangulated right femoral hernia. There was a loop of small bowel which appeared markedly ischemic but had not perforated. I did not feel that it was safe to return that the abdominal cavity for observation.  Procedure in Detail:          Following the induction of general endotracheal anesthesia the patient's abdomen and groins and genitalia were prepped and draped in a sterile fashion. Broad-spectrum antibodics were given. Surgical time out was performed. A  transverse incision was made in the right groin, through the previous scar. This incision had to be extended somewhat medially and somewhat laterally to gain exposure. Dissection was carried down to the external oblique and I found that the inguinal hernia repair was intact, but there was a large lemon size mass coming out under the inguinal ligament. This was dissected free from surrounding tissues. In sequential steps I incised the lacunar ligament medially and so I had this completely opened up. I opened up the hernia sac and found a lot of ischemic-looking omentum as well as a hemorrhagic and probably ischemic-looking loop of small bowel. I was able to pull the small bowel proximal and distal up through the opening and it looked fine. The ischemic segment of bowel was short but does appear somewhat thin walled and had no peristalsis. I felt that this needed to be resected. Using a GIA stapler I resected proximal and distal resecting about 10-15 cm of small bowel. The mesentery was taken down with the LigaSure device. Anastomosis was created between the proximal distal limbs with a GIA stapling device and the common defect was examined, found to be pink and viable,  and this was closed with a TA 60 stapling device. We changed our instruments and gloves. The mesentery was closed with interrupted silk sutures. I washed out the wound. I returned the loops of small bowel to the abdominal cavity. I further washed out the wound. There was no bleeding.  I closed the femoral defect primarily with 4 interrupted sutures of 0 Prolene to Cooper's ligament repair bringing the inguinal ligament tissues down to Cooper's ligament. I tied all the sutures.   This closed the space quite nicely. I was unwilling to to place any kind of mesh in the contaminated field. The wound was irrigated with saline. A 19 [place] Blake drain was placed in the depths of the wound and brought out through a separate stab incision laterally, sutured to  the skin with a nylon suture and connected to suction bulbs. The deep tissues were closed with 2-0 Vicryl sutures and skin closed with skin staples. Clean bandage was placed. The patient taken to recovery in stable condition. EBL 25 cc. Counts correct. Complications none.     [name]. Derrell Lolling, M.D., FACS General and Minimally Invasive Surgery Breast and Colorectal Surgery  12/25/2012 2:13 PM

## 2012-12-25 NOTE — Progress Notes (Signed)
I have stopped heparin DVT prophylaxis for surgery.

## 2012-12-26 ENCOUNTER — Encounter (HOSPITAL_COMMUNITY): Payer: Self-pay | Admitting: General Surgery

## 2012-12-26 LAB — CBC
HCT: 41.8 % (ref 36.0–46.0)
Hemoglobin: 14.5 g/dL (ref 12.0–15.0)
MCHC: 34.7 g/dL (ref 30.0–36.0)
MCV: 89.3 fL (ref 78.0–100.0)
RDW: 13.1 % (ref 11.5–15.5)
WBC: 6.9 10*3/uL (ref 4.0–10.5)

## 2012-12-26 LAB — BASIC METABOLIC PANEL
BUN: 17 mg/dL (ref 6–23)
CO2: 16 mEq/L — ABNORMAL LOW (ref 19–32)
Chloride: 105 mEq/L (ref 96–112)
Creatinine, Ser: 0.67 mg/dL (ref 0.50–1.10)
Potassium: 4.7 mEq/L (ref 3.5–5.1)

## 2012-12-26 LAB — HEPATITIS B SURFACE ANTIGEN: Hepatitis B Surface Ag: NEGATIVE

## 2012-12-26 NOTE — Progress Notes (Signed)
1 Day Post-Op  Subjective: Stable and alert. Mild to moderate pain. Up in chair. Denies shortness of breath. No stool or flatus. Good urine output. Moderate bilious NG drainage. Hemoglobin 14.5. WBC 6900. Potassium 4.7. Creatinine 0.67. Glucose 80.  I explained the operative findings and the operative procedures. I told her that it would take several days to get over this angle home, barring any complications.   Objective: Vital signs in last 24 hours: Temp:  [98.1 F (36.7 C)-99.8 F (37.7 C)] 98.6 F (37 C) (10/21 0400) Pulse Rate:  [61-86] 70 (10/21 0400) Resp:  [11-18] 17 (10/21 0400) BP: (103-160)/(49-78) 155/53 mmHg (10/21 0400) SpO2:  [94 %-100 %] 98 % (10/21 0400) Last BM Date: 12/19/12  Intake/Output from previous day: 10/20 0701 - 10/21 0700 In: 2410 [I.V.:2400] Out: 2495 [Urine:1400; Emesis/NG output:850; Drains:45; Blood:50] Intake/Output this shift: Total I/O In: -  Out: 1170 [Urine:1125; Drains:45]    EXAM: General appearance: lower. Mental status normal. Minimal distress. Resp: clear to auscultation bilaterally GI: abdomen soft. Mild distention and tympany. No real abdominal tenderness. Right groin incision clean and dry. JP drainage serosanguineous, low volume.    Lab Results:  Results for orders placed during the hospital encounter of 12/22/12 (from the past 24 hour(s))  HEPATITIS B SURFACE ANTIGEN     Status: None   Collection Time    12/25/12  2:45 PM      Result Value Range   Hepatitis B Surface Ag NEGATIVE  NEGATIVE  HEPATITIS C ANTIBODY (REFLEX)     Status: None   Collection Time    12/25/12  2:45 PM      Result Value Range   HCV Ab NEGATIVE  NEGATIVE  HIV RAPID SCREEN (BLD OR BODY FLD EXPOSURE)     Status: None   Collection Time    12/25/12  2:45 PM      Result Value Range   SUDS Rapid HIV Screen NON REACTIVE  NON REACTIVE  CBC     Status: None   Collection Time    12/25/12  4:29 PM      Result Value Range   WBC 4.5  4.0 - 10.5 K/uL    RBC 4.58  3.87 - 5.11 MIL/uL   Hemoglobin 14.1  12.0 - 15.0 g/dL   HCT 54.0  98.1 - 19.1 %   MCV 89.1  78.0 - 100.0 fL   MCH 30.8  26.0 - 34.0 pg   MCHC 34.6  30.0 - 36.0 g/dL   RDW 47.8  29.5 - 62.1 %   Platelets 234  150 - 400 K/uL  BASIC METABOLIC PANEL     Status: Abnormal   Collection Time    12/26/12  4:08 AM      Result Value Range   Sodium 138  135 - 145 mEq/L   Potassium 4.7  3.5 - 5.1 mEq/L   Chloride 105  96 - 112 mEq/L   CO2 16 (*) 19 - 32 mEq/L   Glucose, Bld 80  70 - 99 mg/dL   BUN 17  6 - 23 mg/dL   Creatinine, Ser 3.08  0.50 - 1.10 mg/dL   Calcium 8.3 (*) 8.4 - 10.5 mg/dL   GFR calc non Af Amer 82 (*) >90 mL/min   GFR calc Af Amer >90  >90 mL/min  CBC     Status: None   Collection Time    12/26/12  4:08 AM      Result Value Range   WBC  6.9  4.0 - 10.5 K/uL   RBC 4.68  3.87 - 5.11 MIL/uL   Hemoglobin 14.5  12.0 - 15.0 g/dL   HCT 16.1  09.6 - 04.5 %   MCV 89.3  78.0 - 100.0 fL   MCH 31.0  26.0 - 34.0 pg   MCHC 34.7  30.0 - 36.0 g/dL   RDW 40.9  81.1 - 91.4 %   Platelets 247  150 - 400 K/uL     Studies/Results: @RISRSLT24 @  . digoxin  0.125 mg Intravenous Daily  . enoxaparin (LOVENOX) injection  40 mg Subcutaneous Q24H  . ertapenem (INVANZ) IV  1 g Intravenous Q24H  . levothyroxine  50 mcg Intravenous Daily  . metoprolol  5 mg Intravenous Daily     Assessment/Plan: s/p Procedure(s): explor right groin, small bowel rescection, tissue repair right femoral hernia  POD #1. Repair strangulated right femoral hernia with tissue repair, small bowel resection, omentectomy. Stable Expected ileus. Continue NG Mobilized out of bed and ambulate Incentive spirometry Discontinue Foley tomorrow  Pacemaker and cardiomyopathy and chronic systolic heart failure.. Followed by Dr. Peter Sw[place] and Sharrell Ku.  No problems on telemetry. Transfer to telemetry bed.  COPD Hypothyroidism Anxiety and depression  @PROBHOSP @  LOS: 4 days    Garrin Kirwan M. Derrell Lolling,  M.D., [facility] General and Minimally invasive Surgery Breast and Colorectal Surgery Office:   [phone] Pager:   [phone]  12/26/2012  . .prob

## 2012-12-26 NOTE — Progress Notes (Signed)
[id number] [name] Plater, RN, BSN, [place] [phone] Chart Reviewed for discharge and hospital needs. Discharge needs at time of review:  None Review of patient progress due on [id number].

## 2012-12-26 NOTE — Progress Notes (Signed)
She looks good.  Appreciate Dr. Jacinto Halim good care.

## 2012-12-27 LAB — CBC
HCT: 43.2 % (ref 36.0–46.0)
Hemoglobin: 15.1 g/dL — ABNORMAL HIGH (ref 12.0–15.0)
MCH: 30.8 pg (ref 26.0–34.0)
MCHC: 35 g/dL (ref 30.0–36.0)
MCV: 88.2 fL (ref 78.0–100.0)
RBC: 4.9 MIL/uL (ref 3.87–5.11)

## 2012-12-27 LAB — BASIC METABOLIC PANEL
CO2: 19 mEq/L (ref 19–32)
Calcium: 8.4 mg/dL (ref 8.4–10.5)
Creatinine, Ser: 0.61 mg/dL (ref 0.50–1.10)
GFR calc non Af Amer: 85 mL/min — ABNORMAL LOW (ref >90)
Glucose, Bld: 75 mg/dL (ref 70–99)
Potassium: 4.1 mEq/L (ref 3.5–5.1)
Sodium: 134 mEq/L — ABNORMAL LOW (ref 135–145)

## 2012-12-27 MED ORDER — BISACODYL 10 MG RE SUPP
10.0000 mg | Freq: Two times a day (BID) | RECTAL | Status: AC
  Administered 2012-12-27 (×2): 10 mg via RECTAL
  Filled 2012-12-27 (×2): qty 1

## 2012-12-27 NOTE — Progress Notes (Signed)
2 Days Post-Op  Subjective: Stable and alert. No new complaints. Feels weak and fatigued. No flatus or stool. NG output bilious but only 500 cc in 24 hours.denies nausea. Denies shortness of breath.  Creatinine 0.67. Glucose 80. Potassium 4.7. WBC 6900.  Objective: Vital signs in last 24 hours: Temp:  [97.7 F (36.5 C)-98.9 F (37.2 C)] 98.1 F (36.7 C) (10/22 0618) Pulse Rate:  [62-75] 62 (10/22 0618) Resp:  [14-20] 20 (10/22 0618) BP: (129-168)/(40-79) 168/59 mmHg (10/22 0618) SpO2:  [96 %-100 %] 99 % (10/22 0618) Weight:  [138 lb 7.2 oz (62.8 kg)] 138 lb 7.2 oz (62.8 kg) (10/21 1656) Last BM Date: 12/24/12  Intake/Output from previous day: 10/21 0701 - 10/22 0700 In: 1780 [P.O.:180; I.V.:1550; IV Piggyback:50] Out: 2191 [Urine:1625; Emesis/NG output:500; Drains:66] Intake/Output this shift: Total I/O In: 700 [I.V.:700] Out: 1206 [Urine:1175; Drains:31]  General appearance: alert. Minimal distress. Mental status normal. Resp: clear to auscultation bilaterally GI: abdomen soft. Nontender. Minimal bowel sounds. Still slightly distended and tympanitic. Right groin wound clean. Soft. The JP drainage serosanguineous, not excessive  Lab Results:  No results found for this or any previous visit (from the past 24 hour(s)).   Studies/Results: @RISRSLT24 @  . bisacodyl  10 mg Rectal BID  . digoxin  0.125 mg Intravenous Daily  . enoxaparin (LOVENOX) injection  40 mg Subcutaneous Q24H  . levothyroxine  50 mcg Intravenous Daily  . metoprolol  5 mg Intravenous Daily     Assessment/Plan: s/p Procedure(s): explor right groin, small bowel rescection, tissue repair right femoral hernia  POD #2. Repair strangulated right femoral hernia with tissue repair, small bowel resection, omentectomy.  Stable  Expected ileus. Continue NG  Mobilized out of bed and ambulate  Incentive spirometry  Discontinue Foley now   Pacemaker and cardiomyopathy and chronic systolic heart failure..  Followed by Dr. Peter [place] and Sharrell Ku. No problems on telemetry.    COPD  Hypothyroidism  Anxiety and depression   @PROBHOSP @  LOS: 5 days    Zoi Devine M. Derrell Lolling, M.D., [facility] General and Minimally invasive Surgery Breast and Colorectal Surgery Office:   [phone] Pager:   [phone]  12/27/2012  . .prob

## 2012-12-27 NOTE — Evaluation (Signed)
Physical Therapy Evaluation Patient Details Name: [name] MRN: [id number] DOB: November 26, 1932 Today's Date: 12/27/2012 Time: 7829-5621 PT Time Calculation (min): 21 min  PT Assessment / Plan / Recommendation History of Present Illness  79 yo female admitted 12/22/12. S/P explaoratory lap, SB resection, primary repair strangulated femoral hernia on 12/25/12.  Clinical Impression  Pt is moving well. Pt did not want to walk a this time. Pt assisted to [facility]. Pt reports  That she does not want to go to a rehab. Pt will benefit from PT to address problems listed. Rec. OT consult.    PT Assessment  Patient needs continued PT services    Follow Up Recommendations  SNF;Supervision/Assistance - 24 hour (unless pt has 24/7 caregivers arranged.)    Does the patient have the potential to tolerate intense rehabilitation      Barriers to Discharge Decreased caregiver support      Equipment Recommendations  None recommended by PT    Recommendations for Other Services     Frequency Min 3X/week    Precautions / Restrictions Precautions Precautions: Fall Precaution Comments: NGT, R  abd drain.   Pertinent Vitals/Pain Did not report pain, HAD MEDICATION      Mobility  Bed Mobility Details for Bed Mobility Assistance: pt already partially sitting atthe bedside with legs over the edge. Transfers Transfers: Sit to Stand;Stand to Sit;Stand Pivot Transfers Sit to Stand: 4: Min assist;From bed;From chair/3-in-1;With upper extremity assist Stand to Sit: 4: Min guard;To chair/3-in-1;With upper extremity assist Stand Pivot Transfers: 4: Min assist Details for Transfer Assistance: extra time for working around all lines and tubes, safety  Ambulation/Gait Ambulation/Gait Assistance: 4: Min assist Ambulation Distance (Feet): 5 Feet Assistive device: 1 person hand held assist Ambulation/Gait Assistance Details: pt declined to ambulate  any farther. Gait Pattern: Step-to pattern    Exercises      PT Diagnosis: Difficulty walking;Generalized weakness  PT Problem List: Decreased strength;Decreased activity tolerance;Decreased mobility;Decreased knowledge of use of DME;Decreased safety awareness;Decreased knowledge of precautions;Pain PT Treatment Interventions: DME instruction;Gait training;Functional mobility training;Therapeutic activities;Therapeutic exercise;Patient/family education     PT Goals(Current goals can be found in the care plan section) Acute Rehab PT Goals Patient Stated Goal: I want to go home or to my sister's. I don;t want to go to a rehab. PT Goal Formulation: With patient Time For Goal Achievement: 01/10/13 Potential to Achieve Goals: Good  Visit Information  Last PT Received On: 12/27/12 Assistance Needed: +1 History of Present Illness: 78 yo female admitted 12/22/12. S/P explaoratory lap, SB resection, primary repair strangulated femoral hernia on 12/25/12.       Prior Functioning  Home Living Family/patient expects to be discharged to:: Private residence Living Arrangements: Alone Available Help at Discharge: Family;Available PRN/intermittently Type of Home: House Home Access: Stairs to enter Home Layout: One level Home Equipment: None Prior Function Level of Independence: Independent Communication Communication: No difficulties    Cognition  Cognition Arousal/Alertness: Awake/alert Behavior During Therapy: WFL for tasks assessed/performed Overall Cognitive Status: Within Functional Limits for tasks assessed    Extremity/Trunk Assessment Upper Extremity Assessment Upper Extremity Assessment: Generalized weakness Lower Extremity Assessment Lower Extremity Assessment: Generalized weakness Cervical / Trunk Assessment Cervical / Trunk Assessment: Kyphotic   Balance Balance Balance Assessed: Yes Static Standing Balance Static Standing - Balance Support: No upper extremity supported Static Standing - Level of Assistance: 5: Stand by  assistance  End of Session PT - End of Session Activity Tolerance: Patient tolerated treatment well;Patient limited by fatigue Patient left:  in chair;with call bell/phone within reach Nurse Communication: Mobility status  GP     Rada Hay 12/27/2012, 3:56 PM Blanchard Kelch PT [phone]

## 2012-12-28 ENCOUNTER — Inpatient Hospital Stay (HOSPITAL_COMMUNITY): Payer: Medicare Other

## 2012-12-28 LAB — BASIC METABOLIC PANEL
BUN: 13 mg/dL (ref 6–23)
CO2: 19 mEq/L (ref 19–32)
Calcium: 8.6 mg/dL (ref 8.4–10.5)
Chloride: 106 mEq/L (ref 96–112)
Creatinine, Ser: 0.61 mg/dL (ref 0.50–1.10)
GFR calc Af Amer: >90 mL/min (ref >90)
GFR calc non Af Amer: 85 mL/min — ABNORMAL LOW (ref >90)
Glucose, Bld: 80 mg/dL (ref 70–99)
Sodium: 139 mEq/L (ref 135–145)

## 2012-12-28 LAB — CBC
HCT: 39.3 % (ref 36.0–46.0)
MCH: 30.3 pg (ref 26.0–34.0)
MCHC: 34.4 g/dL (ref 30.0–36.0)
MCV: 88.1 fL (ref 78.0–100.0)
Platelets: 223 10*3/uL (ref 150–400)
RBC: 4.46 MIL/uL (ref 3.87–5.11)
RDW: 13.2 % (ref 11.5–15.5)
WBC: 7.5 10*3/uL (ref 4.0–10.5)

## 2012-12-28 LAB — PREALBUMIN: Prealbumin: 8.6 mg/dL — ABNORMAL LOW (ref 17.0–34.0)

## 2012-12-28 MED ORDER — FAMOTIDINE IN NACL 20-0.9 MG/50ML-% IV SOLN
20.0000 mg | Freq: Two times a day (BID) | INTRAVENOUS | Status: DC
  Administered 2012-12-28 – 2013-01-07 (×22): 20 mg via INTRAVENOUS
  Filled 2012-12-28 (×26): qty 50

## 2012-12-28 NOTE — Progress Notes (Signed)
3 Days Post-Op  Subjective: She still has allot of NG drainage coming up, dark green in color.  I turned up the suction some and she put out another 150 ml in a few minutes.  Has not walked yet, just up to chair yesterday.  She says she had a couple loose stools, but with the amount of NG drainage I am hesitant to think she is opened up yet.  Objective: Vital signs in last 24 hours: Temp:  [97.6 F (36.4 C)-98.1 F (36.7 C)] 98.1 F (36.7 C) (10/23 0530) Pulse Rate:  [65-77] 77 (10/23 0530) Resp:  [16-18] 16 (10/23 0530) BP: (149-170)/(56-68) 159/60 mmHg (10/23 0530) SpO2:  [97 %-99 %] 97 % (10/23 0530) Last BM Date: 12/27/12 120 po recorded 78295 /NG yesterday 40 ml from drain, looks like mostly clotted blood this AM Afebrile, VSS Labs OK Bi V pacing on telemetry with a few PVC's Intake/Output from previous day: 10/22 0701 - 10/23 0700 In: 2536.7 [P.O.:120; I.V.:2416.7] Out: 1830 [Urine:440; Emesis/NG output:1350; Drains:40] Intake/Output this shift:    General appearance: alert, cooperative and no distress Resp: clear to auscultation bilaterally GI: soft, still a little distended BS, hypoactive.  Incision looks fine.  Lab Results:   Recent Labs  12/27/12 0655 12/28/12 0545  WBC 6.2 7.5  HGB 15.1* 13.5  HCT 43.2 39.3  PLT 223 223    BMET  Recent Labs  12/27/12 0655 12/28/12 0545  NA 134* 139  K 4.1 3.9  CL 103 106  CO2 19 19  GLUCOSE 75 80  BUN 13 13  CREATININE 0.61 0.61  CALCIUM 8.4 8.6   PT/INR No results found for this basename: LABPROT, INR,  in the last 72 hours   Recent Labs Lab 12/22/12 1810  AST 39*  ALT 21  ALKPHOS 78  BILITOT 1.1  PROT 7.1  ALBUMIN 3.8     Lipase     Component Value Date/Time   LIPASE 15 12/20/2012 2320     Studies/Results: No results found.  Medications: . digoxin  0.125 mg Intravenous Daily  . enoxaparin (LOVENOX) injection  40 mg Subcutaneous Q24H  . levothyroxine  50 mcg Intravenous Daily  .  metoprolol  5 mg Intravenous Daily   Prior to Admission medications   Medication Sig Start Date End Date Taking? Authorizing Provider  acetaminophen (TYLENOL) 500 MG tablet Take 500 mg by mouth every 6 (six) hours as needed for pain. For pain   Yes Historical Provider, MD  Ascorbic Acid (VITAMIN C) 1000 MG tablet Take 1,000 mg by mouth daily.   Yes Historical Provider, MD  Biotin 1000 MCG tablet Take 5,000 mcg by mouth daily.    Yes Historical Provider, MD  Calcium Carbonate-Vitamin D (CALCIUM + D PO) Take 1 tablet by mouth daily. 1200mg  of Calcium and 1000mg  Vit D   Yes Historical Provider, MD  Cyanocobalamin (VITAMIN B 12 PO) Take 1 tablet by mouth daily.    Yes Historical Provider, MD  digoxin (LANOXIN) 0.125 MG tablet Take 1 tablet (125 mcg total) by mouth daily. 10/27/12  Yes Peter M [place], MD  ESTRACE VAGINAL 0.1 MG/GM vaginal cream Place 1 g vaginally 2 (two) times a week.  03/13/12  Yes Historical Provider, MD  estrogen-methylTESTOSTERone (ESTRATEST HS) 0.625-1.25 MG per tablet Take 1 tablet by mouth daily.    Yes Historical Provider, MD  furosemide (LASIX) 20 MG tablet Take 20 mg by mouth daily as needed. For fluid retention   Yes Historical Provider, MD  levothyroxine (SYNTHROID, LEVOTHROID) 112 MCG tablet Take 112 mcg by mouth daily.     Yes Historical Provider, MD  Magnesium 250 MG TABS Take 1 tablet by mouth daily.    Yes Historical Provider, MD  metoprolol tartrate (LOPRESSOR) 25 MG tablet Take 12.5 mg by mouth daily.  06/20/12  Yes Marinus Maw, MD  Multiple Vitamin (MULTIVITAMIN WITH MINERALS) TABS Take 1 tablet by mouth daily.   Yes Historical Provider, MD  OVER THE COUNTER MEDICATION Place 1 drop into both eyes daily as needed. OTC Dry Eyes Eyedrop  For dry eyes   Yes Historical Provider, MD  potassium chloride SA (K-DUR,KLOR-CON) 20 MEQ tablet Take 20 mEq by mouth daily as needed. When taking the lasix   Yes Historical Provider, MD  RABEprazole (ACIPHEX) 20 MG tablet Take 1  tablet (20 mg total) by mouth daily. 11/02/12  Yes Meryl Dare, MD  trandolapril (MAVIK) 2 MG tablet Take 1 tablet (2 mg total) by mouth 2 (two) times daily. 09/13/12  Yes Peter M [place], MD     Assessment/Plan Incarcerated right groin hernia with small bowel obstruction Exploration right groin, small bowel resection, partial omentectomy, primary tissue repair of the right femoral hernia, Cooper's ligament technique, 12/25/2012, Ernestene Mention, MD  Hx of NICM EF 50% 10/2010/BIV-ICD removed 04/2012 Hx of CHF/LBBB/MR hYPOTHYROID hYPERTENSION COPD Hyperlipidemia Dysthymic disorder Hx of OSA/RESTLESS Leg    Plan:  Mobilize, continue NG suction, IV fluids and bowel rest. It has been over a week since she ate.  May need to consider TNA if she does not open more soon. I have added prealbumin to labs. Put her back on H2 blocker in place of preop PPI.  LOS: 6 days    Luzelena Heeg 12/28/2012

## 2012-12-28 NOTE — Progress Notes (Signed)
General surgery attending note: I have interviewed and examined this patient this morning. I agree with the assessment and treatment plan outlined by [name], [name]. Despite the fact that the patient looks pretty good and states she has had a couple of small bowel movements, her abdomen is still a little distended, still somewhat tympanitic, and she is having a fair amount of bilious NG drainage.  We will leave the NG tube in Check abdominal x-ray to make sure a G-tube is not  in the duodenum Check electrolytes tomorrow   [name]. Derrell Lolling, M.D., [facility] General and Minimally invasive Surgery Breast and Colorectal Surgery Office:   [phone] Pager:   [phone]

## 2012-12-28 NOTE — Progress Notes (Addendum)
Clinical Social Work Department CLINICAL SOCIAL WORK PLACEMENT NOTE 12/28/2012  Patient:  TIKIA, SKILTON  Account Number:  [id number] Admit date:  12/22/2012  Clinical Social Worker:  Unk Lightning, LCSW  Date/time:  12/28/2012 10:30 AM  Clinical Social Work is seeking post-discharge placement for this patient at the following level of care:   SKILLED NURSING   (*CSW will update this form in Epic as items are completed)   12/28/2012  Patient/family provided with [facility] Department of Clinical Social Work's list of facilities offering this level of care within the geographic area requested by the patient (or if unable, by the patient's family).  12/28/2012  Patient/family informed of their freedom to choose among providers that offer the needed level of care, that participate in Medicare, Medicaid or managed care program needed by the patient, have an available bed and are willing to accept the patient.  12/28/2012  Patient/family informed of [facility]' ownership interest in [facility], as well as of the fact that they are under no obligation to receive care at this facility.  PASARR submitted to EDS on 12/28/2012 PASARR number received from [employer] on 12/28/2012  FL2 transmitted to all facilities in geographic area requested by pt/family on  12/28/2012 FL2 transmitted to all facilities within larger geographic area on   Patient informed that his/her managed care company has contracts with or will negotiate with  certain facilities, including the following:     Patient/family informed of bed offers received:  12/29/2012 Patient chooses bed at  Physician recommends and patient chooses bed at    Patient to be transferred to  on   Patient to be transferred to facility by   The following physician request were entered in Epic:   Additional Comments:

## 2012-12-28 NOTE — Progress Notes (Signed)
Clinical Social Work Department BRIEF PSYCHOSOCIAL ASSESSMENT 12/28/2012  Patient:  [name], [name]     Account Number:  [id number]     Admit date:  12/22/2012  Clinical Social Worker:  Dennison Bulla  Date/Time:  12/28/2012 10:30 AM  Referred by:  Physician  Date Referred:  12/28/2012 Referred for  SNF Placement   Other Referral:   Interview type:  Patient Other interview type:    PSYCHOSOCIAL DATA Living Status:  ALONE Admitted from facility:   Level of care:   Primary support name:  Richard Primary support relationship to patient:  FRIEND Degree of support available:   Adequate    CURRENT CONCERNS Current Concerns  Post-Acute Placement   Other Concerns:    SOCIAL WORK ASSESSMENT / PLAN CSW received referral to assist with DC planning. CSW reviewed chart which stated PT recommended SNF at DC. CSW met with patient at bedside and introduced myself.    Patient reports that she lives home alone and that she was not expecting to have surgery. Prior to admission patient was completely independent and reports that now that she has been in the hospital she is feeling deconditioned. CSW and patient discussed how PT was recommending SNF placement at [place]. Patient has not been to SNF in the past but reports depending on her progress she would consider placement. CSW provided SNF list and explained process. Patient wants to stay in [facility] area. CSW explained Medicare coverage and will complete search. Patient will continue to evaluate her needs and will decide if she definitely wants SNF at [place].    CSW completed FL2 and faxed to [facility]. CSW will follow up with bed offers.   Assessment/plan status:  Psychosocial Support/Ongoing Assessment of Needs Other assessment/ plan:   Information/referral to community resources:   SNF information    PATIENT'S/FAMILY'S RESPONSE TO PLAN OF CARE: Patient alert and oriented. Patient upset about hospital stay and reports she is  ready to leave. Patient feels she has lost her independence and was not wanting to have to stay as long. Patient understanding of condition and aware that SNF might be needed. Patient thanked CSW for time and agreeable for CSW to continue to follow.       Unk Lightning, LCSW (Coverage for [employer])

## 2012-12-29 LAB — BASIC METABOLIC PANEL
CO2: 19 mEq/L (ref 19–32)
Calcium: 8.9 mg/dL (ref 8.4–10.5)
Creatinine, Ser: 0.59 mg/dL (ref 0.50–1.10)
GFR calc Af Amer: >90 mL/min (ref >90)
GFR calc non Af Amer: 86 mL/min — ABNORMAL LOW (ref >90)
Glucose, Bld: 66 mg/dL — ABNORMAL LOW (ref 70–99)

## 2012-12-29 MED ORDER — VITAMINS A & D EX OINT
TOPICAL_OINTMENT | CUTANEOUS | Status: AC
  Filled 2012-12-29: qty 5

## 2012-12-29 MED ORDER — BOOST / RESOURCE BREEZE PO LIQD
1.0000 | Freq: Three times a day (TID) | ORAL | Status: DC
  Administered 2012-12-29: 1 via ORAL

## 2012-12-29 NOTE — Progress Notes (Signed)
Physical Therapy Treatment Patient Details Name: [name] MRN: [id number] DOB: 01-01-1933 Today's Date: 12/29/2012 Time: 1191-4782 PT Time Calculation (min): 25 min  PT Assessment / Plan / Recommendation  History of Present Illness 79 yo female admitted 12/22/12. S/P explaoratory lap, SB resection, primary repair strangulated femoral hernia on 12/25/12.   PT Comments   Pt improving in mobility, ambulated x 400'. Pt does demonstrate mild dynamic imbalance when ambulating. Agree with SNF at this time.Pt is very motivated to mobilize as much as possible.  Follow Up Recommendations  SNF;Supervision/Assistance - 24 hour     Does the patient have the potential to tolerate intense rehabilitation     Barriers to Discharge        Equipment Recommendations  None recommended by PT    Recommendations for Other Services    Frequency Min 3X/week   Progress towards PT Goals Progress towards PT goals: Progressing toward goals  Plan Current plan remains appropriate    Precautions / Restrictions Precautions Precautions: Fall Precaution Comments: , R  abd drain.   Pertinent Vitals/Pain Reports abd. Pain is much less than after previous surgery.     Mobility  Bed Mobility Bed Mobility: Supine to Sit Supine to Sit: 6: Modified independent (Device/Increase time) Transfers Sit to Stand: 4: Min guard;From bed;With upper extremity assist Stand to Sit: 5: Supervision;To chair/3-in-1;With upper extremity assist;With armrests Details for Transfer Assistance: pt is steady when standing up.Able to manage self after toileting, could stand and wash her hands. Ambulation/Gait Ambulation/Gait Assistance: 4: Min assist Ambulation Distance (Feet): 425 Feet Assistive device: 2 person hand held assist Ambulation/Gait Assistance Details: Pt is unsteady when she turns head to loof around, also when turned corner. Pt reports that she may do better in her sneakers but declined them at present. Gait  Pattern: Step-through pattern;Trunk flexed Gait velocity: slow    Exercises     PT Diagnosis:    PT Problem List:   PT Treatment Interventions:     PT Goals (current goals can now be found in the care plan section)    Visit Information  Last PT Received On: 12/29/12 History of Present Illness: 79 yo female admitted 12/22/12. S/P explaoratory lap, SB resection, primary repair strangulated femoral hernia on 12/25/12.    Subjective Data      Cognition  Cognition Arousal/Alertness: Awake/alert    Balance  Dynamic Standing Balance Dynamic Standing - Balance Support: Right upper extremity supported Dynamic Standing - Level of Assistance: 4: Min assist  End of Session PT - End of Session Activity Tolerance: Patient tolerated treatment well Patient left: in chair;with call bell/phone within reach;with chair alarm set Nurse Communication: Mobility status   GP     [name] 12/29/2012, 8:40 AM [name] PT [phone]

## 2012-12-29 NOTE — Progress Notes (Signed)
CSW continuing to follow for disposition planning.  CSW reviewed chart and noted that PT/OT continue to recommend SNF for rehab.   CSW met with pt at bedside to provide SNF bed offers. CSW clarified pt questions and concerns. Pt would like to review bed offers and see how she progresses to make determination about disposition plan.   CSW to follow up with pt re: disposition plan.  CSW to continue to follow.  Jacklynn Lewis, MSW, LCSWA  Clinical Social Work [phone]

## 2012-12-29 NOTE — Progress Notes (Signed)
NUTRITION FOLLOW UP  Intervention:   Provide Resource Breeze TID Change supplement to Ensure Complete BID once diet is advanced Diet advancement per MD discretion  Nutrition Dx:   Inadequate oral intake related to inability to eat as evidenced by NPO; ongoing- diet advanced to clear liquids, intake remains inadequate  Goal:   Pt to meet >/= 90% of their estimated nutrition needs; not met  Monitor:   Weight; 2 lb wt loss Diet advancement; clears Labs; low GFR trending up   Assessment:   Pt admitted with nausea and vomiting. SBO vs ileus.   Pt now 4 days s/p small bowel resection and tissue repair of right femoral hernia. Pt's diet was advanced to clear liquids 10/22 and NG tube was removed today. Pt reports drinking clear liquids and tolerating it well; pt denies nausea, vomiting, or abdominal pain. Pt states she has not had any solid food to eat since last Tuesday. Pt reports desire for quick diet advancement.   Height: Ht Readings from Last 1 Encounters:  12/26/12 5\' 8"  (1.727 m)    Weight Status:   Wt Readings from Last 1 Encounters:  12/26/12 138 lb 7.2 oz (62.8 kg)    Re-estimated needs:  Kcal: 1650-1900  Protein: 75-90 g  Fluid: >1.7 L  Skin: abdominal incision and groin incision  Diet Order: Clear Liquid   Intake/Output Summary (Last 24 hours) at 12/29/12 1534 Last data filed at 12/29/12 1300  Gross per 24 hour  Intake   2250 ml  Output   2280 ml  Net    -30 ml    Last BM: 10/22   Labs:   Recent Labs Lab 12/27/12 0655 12/28/12 0545 12/29/12 0513  NA 134* 139 142  K 4.1 3.9 4.1  CL 103 106 107  CO2 19 19 19   BUN 13 13 13   CREATININE 0.61 0.61 0.59  CALCIUM 8.4 8.6 8.9  GLUCOSE 75 80 66*    CBG (last 3)  No results found for this basename: GLUCAP,  in the last 72 hours  Scheduled Meds: . digoxin  0.125 mg Intravenous Daily  . enoxaparin (LOVENOX) injection  40 mg Subcutaneous Q24H  . famotidine (PEPCID) IV  20 mg Intravenous Q12H  .  levothyroxine  50 mcg Intravenous Daily  . metoprolol  5 mg Intravenous Daily  . vitamin A & D        Continuous Infusions: . 0.9 % NaCl with KCl 20 mEq / L 100 mL/hr at 12/28/12 2105    Ian Malkin RD, LDN Inpatient Clinical Dietitian Pager: [phone] After Hours Pager: [phone]

## 2012-12-29 NOTE — Evaluation (Signed)
Occupational Therapy Evaluation Patient Details Name: [name] MRN: [id number] DOB: 10/26/1932 Today's Date: 12/29/2012 Time: 5621-3086 OT Time Calculation (min): 20 min  OT Assessment / Plan / Recommendation History of present illness 79 yo female admitted 12/22/12. S/P explaoratory lap, SB resection, primary repair strangulated femoral hernia on 12/25/12.   Clinical Impression   Pt doing well and is overall min guard to min assist with ADL. Feel she will benefit from skilled OT services to improve safety and independence with self care tasks for d/c next venue.    OT Assessment  Patient needs continued OT Services    Follow Up Recommendations  SNF;Supervision/Assistance - 24 hour    Barriers to Discharge      Equipment Recommendations  None recommended by OT    Recommendations for Other Services    Frequency  Min 2X/week    Precautions / Restrictions Precautions Precautions: Fall Precaution Comments: , R  abd drain. Restrictions Weight Bearing Restrictions: No   Pertinent Vitals/Pain No complaint of    ADL  Grooming: Performed;Wash/dry hands;Min guard Where Assessed - Grooming: Unsupported standing Upper Body Bathing: Simulated;Chest;Right arm;Left arm;Abdomen;Set up Where Assessed - Upper Body Bathing: Unsupported sitting Lower Body Bathing: Simulated;Min guard Upper Body Dressing: Simulated;Set up Where Assessed - Upper Body Dressing: Unsupported sitting Lower Body Dressing: Simulated;Min guard Where Assessed - Lower Body Dressing: Supported sit to stand Toilet Transfer: Performed;Minimal Web designer: Comfort height toilet;Grab bars Toileting - Architect and Hygiene: Simulated;Min guard Where Assessed - Engineer, mining and Hygiene: Sit to stand from 3-in-1 or toilet ADL Comments: Pt noted to be a little unsteady with transfer into bathroom to toilet and back out of bathroom. She demonstrated decreased  safety with attempting to step over IV pole wheels and required cues to let therapist clear path for her. She also had a little bit of a "plop" when sitting on commode but states her toilet is higher at home. She was able to stand and straighten own bed pad when she returned to bed. Discussed with pt that OT feels she could benefit rom SNF to increase safety and independence with tasks to return home alone.    OT Diagnosis: Generalized weakness  OT Problem List: Decreased strength;Decreased knowledge of use of DME or AE;Decreased safety awareness OT Treatment Interventions: Self-care/ADL training;DME and/or AE instruction;Patient/family education;Therapeutic activities   OT Goals(Current goals can be found in the care plan section) Acute Rehab OT Goals Patient Stated Goal: wants to do for herself OT Goal Formulation: With patient Time For Goal Achievement: 01/12/13 Potential to Achieve Goals: Good  Visit Information  Last OT Received On: 12/29/12 Assistance Needed: +1 History of Present Illness: 79 yo female admitted 12/22/12. S/P explaoratory lap, SB resection, primary repair strangulated femoral hernia on 12/25/12.       Prior Functioning     Home Living Family/patient expects to be discharged to:: Private residence Living Arrangements: Alone Available Help at Discharge: Family;Available PRN/intermittently Type of Home: House Home Access: Stairs to enter Home Layout: One level Home Equipment: None Prior Function Level of Independence: Independent Communication Communication: No difficulties         Vision/Perception     Cognition  Cognition Arousal/Alertness: Awake/alert Behavior During Therapy: WFL for tasks assessed/performed Overall Cognitive Status: Within Functional Limits for tasks assessed    Extremity/Trunk Assessment Upper Extremity Assessment Upper Extremity Assessment: Overall WFL for tasks assessed     Mobility Bed Mobility Bed Mobility: Supine to  Sit;Sit to Supine Supine  to Sit: 6: Modified independent (Device/Increase time);HOB elevated Sit to Supine: 6: Modified independent (Device/Increase time);HOB elevated Transfers Transfers: Sit to Stand;Stand to Sit Sit to Stand: 4: Min guard;From bed;With upper extremity assist Stand to Sit: 4: Min guard;To toilet;To bed;With upper extremity assist Details for Transfer Assistance: tended to "plop" back when sitting on commode but did better with higher bed surface.      Exercise     Balance Balance Balance Assessed: Yes Dynamic Standing Balance Dynamic Standing - Balance Support: No upper extremity supported Dynamic Standing - Level of Assistance: 4: Min assist (min guard)   End of Session OT - End of Session Activity Tolerance: Patient tolerated treatment well Patient left: in bed;with call bell/phone within reach;with bed alarm set  GO     [name] [phone] 12/29/2012, 10:54 AM

## 2012-12-29 NOTE — Progress Notes (Signed)
4 Days Post-Op  Subjective: Feeling better. Passing some flatus. EGD I yesterday but little overnight. Denies nausea. Feels a little hungry. Passing flatus. Denies abdominal pain.  Abdominal x-ray yesterday showed scattered large and small bowel gas, mild ileus, no obstructive pattern. NG tube is in gastric body.  Labs looked good. Potassium 4.1. Creatinine 0.59. Glucose 66. Objective: Vital signs in last 24 hours: Temp:  [97.6 F (36.4 C)-98.3 F (36.8 C)] 97.6 F (36.4 C) (10/24 0616) Pulse Rate:  [55-63] 55 (10/24 0616) Resp:  [18] 18 (10/24 0616) BP: (147-162)/(55-57) 162/57 mmHg (10/24 0616) SpO2:  [97 %-100 %] 99 % (10/24 0616) Last BM Date: 12/27/12  Intake/Output from previous day: 10/23 0701 - 10/24 0700 In: 2013.3 [P.O.:120; I.V.:1683.3; NG/GT:110; IV Piggyback:100] Out: 3475 [Urine:940; Emesis/NG output:2500; Drains:35] Intake/Output this shift: Total I/O In: 456.7 [I.V.:406.7; IV Piggyback:50] Out: 620 [Urine:400; Emesis/NG output:200; Drains:20]   EXAM: General appearance: alert. Pleasant. A little bit deconditioned. Mental status normal. No distress. GI: Softer Much less tympanitic. Nontender. Right groin incision soft, no hematoma. No infection. JP draining serosanguineous.  Lab Results:  Results for orders placed during the hospital encounter of 12/22/12 (from the past 24 hour(s))  PREALBUMIN     Status: Abnormal   Collection Time    12/28/12 11:16 AM      Result Value Range   Prealbumin 8.6 (*) 17.0 - 34.0 mg/dL  BASIC METABOLIC PANEL     Status: Abnormal   Collection Time    12/29/12  5:13 AM      Result Value Range   Sodium 142  135 - 145 mEq/L   Potassium 4.1  3.5 - 5.1 mEq/L   Chloride 107  96 - 112 mEq/L   CO2 19  19 - 32 mEq/L   Glucose, Bld 66 (*) 70 - 99 mg/dL   BUN 13  6 - 23 mg/dL   Creatinine, Ser 1.61  0.50 - 1.10 mg/dL   Calcium 8.9  8.4 - 09.6 mg/dL   GFR calc non Af Amer 86 (*) >90 mL/min   GFR calc Af Amer >90  >90 mL/min      Studies/Results: @RISRSLT24 @  . digoxin  0.125 mg Intravenous Daily  . enoxaparin (LOVENOX) injection  40 mg Subcutaneous Q24H  . famotidine (PEPCID) IV  20 mg Intravenous Q12H  . levothyroxine  50 mcg Intravenous Daily  . metoprolol  5 mg Intravenous Daily  . vitamin A & D         Assessment/Plan: s/p Procedure(s): explor right groin, small bowel rescection, tissue repair right femoral hernia   POD #4. right groin exploration with small bowel resection. Primary tissue repair of strangulated right femoral hernia And partial omentectomy. Improving. Ileus resolving. Discontinue nasogastric tube Clear liquid diet Drain out prior to discharge.  Deconditioning. PT and OT consult  Pacemaker and cardiomyopathy and chronic systolic heart failure.. Followed by Dr. Peter [place] and Sharrell Ku.  Transition back to po meds tomorrow if  tol.diet as well.  DVT prophylaxis. Lovenox.   Hx of NICM EF 50% 10/2010/BIV-ICD removed 04/2012  Hx of CHF/LBBB/MR  hYPOTHYROID  hYPERTENSION  COPD  Hyperlipidemia  Dysthymic disorder  Hx of OSA/RESTLESS Leg   @PROBHOSP @  LOS: 7 days    [name] M. Derrell Lolling, M.D., [facility] General and Minimally invasive Surgery Breast and Colorectal Surgery Office:   [phone] Pager:   [phone]  12/29/2012  . .prob

## 2012-12-29 NOTE — Progress Notes (Signed)
PT eval complete and pt ambulated in hallway with therapist. She was up in hallway earlier with nurse; did well- moderately deconditioned.

## 2012-12-30 ENCOUNTER — Inpatient Hospital Stay (HOSPITAL_COMMUNITY): Payer: Medicare Other

## 2012-12-30 DIAGNOSIS — I4949 Other premature depolarization: Secondary | ICD-10-CM

## 2012-12-30 DIAGNOSIS — I428 Other cardiomyopathies: Secondary | ICD-10-CM

## 2012-12-30 LAB — GLUCOSE, CAPILLARY: Glucose-Capillary: 121 mg/dL — ABNORMAL HIGH (ref 70–99)

## 2012-12-30 MED ORDER — FAT EMULSION 20 % IV EMUL
250.0000 mL | INTRAVENOUS | Status: DC
  Filled 2012-12-30: qty 250

## 2012-12-30 MED ORDER — METOPROLOL TARTRATE 1 MG/ML IV SOLN
5.0000 mg | Freq: Four times a day (QID) | INTRAVENOUS | Status: DC
  Administered 2012-12-30 – 2013-01-05 (×20): 5 mg via INTRAVENOUS
  Filled 2012-12-30 (×27): qty 5

## 2012-12-30 MED ORDER — INSULIN ASPART 100 UNIT/ML ~~LOC~~ SOLN
0.0000 [IU] | Freq: Three times a day (TID) | SUBCUTANEOUS | Status: AC
  Administered 2012-12-31 – 2013-01-05 (×5): 1 [IU] via SUBCUTANEOUS
  Administered 2013-01-05: 2 [IU] via SUBCUTANEOUS
  Administered 2013-01-06 (×2): 1 [IU] via SUBCUTANEOUS

## 2012-12-30 MED ORDER — TRACE MINERALS CR-CU-F-FE-I-MN-MO-SE-ZN IV SOLN
INTRAVENOUS | Status: DC
  Filled 2012-12-30: qty 1000

## 2012-12-30 MED ORDER — POTASSIUM CHLORIDE IN NACL 20-0.9 MEQ/L-% IV SOLN
INTRAVENOUS | Status: AC
  Administered 2012-12-30 – 2012-12-31 (×2): via INTRAVENOUS
  Filled 2012-12-30 (×3): qty 1000

## 2012-12-30 NOTE — Progress Notes (Signed)
5 Days Post-Op  Subjective: Multiple runs of v tach-aymptomatic.  She is having increased abdominal pain and nausea and vomiting.  Objective:   Vital signs in last 24 hours: Temp:  [98.3 F (36.8 C)-98.5 F (36.9 C)] 98.5 F (36.9 C) (10/25 0529) Pulse Rate:  [65-75] 75 (10/25 0529) Resp:  [16-18] 16 (10/25 0529) BP: (114-133)/(55-58) 114/58 mmHg (10/25 0529) SpO2:  [97 %-100 %] 97 % (10/25 0529) Last BM Date: 12/26/12  Intake/Output from previous day: 10/24 0701 - 10/25 0700 In: 2639.6 [P.O.:600; I.V.:1939.6; IV Piggyback:100] Out: 1565 [Urine:775; Emesis/NG output:750; Drains:40] Intake/Output this shift:    General appearance: alert, cooperative and no distress Resp: clear to auscultation bilaterally Cardio: normal rate, regular, I have reviewed the rhythm strips and she is having frequent runs of v tach. GI: soft, mod distension with tympany, mild tenderness, wound okay, no peritonitis Extremities: SCD's bilat  Lab Results:   Recent Labs  12/28/12 0545  WBC 7.5  HGB 13.5  HCT 39.3  PLT 223   BMET  Recent Labs  12/28/12 0545 12/29/12 0513  NA 139 142  K 3.9 4.1  CL 106 107  CO2 19 19  GLUCOSE 80 66*  BUN 13 13  CREATININE 0.61 0.59  CALCIUM 8.6 8.9   PT/INR No results found for this basename: LABPROT, INR,  in the last 72 hours ABG No results found for this basename: PHART, PCO2, PO2, HCO3,  in the last 72 hours  Studies/Results: Dg Abd 1 View  12/28/2012   CLINICAL DATA:  Postop ileus.  EXAM: ABDOMEN - 1 VIEW  COMPARISON:  12/24/2012  FINDINGS: Mild gaseous distention of scattered small bowel loops. Moderate stool throughout the colon. NG tube is in the stomach, unchanged. No free air organomegaly.  IMPRESSION: Mild gaseous distention of bowel, predominately small bowel loops.   Electronically Signed   By: Charlett Nose M.D.   On: 12/28/2012 13:45   Dg Abd 2 Views  12/30/2012   CLINICAL DATA:  Hernia repair and small bowel resections on 12/2012,  with vomiting currently  EXAM: ABDOMEN - 2 VIEW  COMPARISON:  12/28/2012, 12/1912  FINDINGS: In the mid abdomen, there are numerous loops of prominent small bowel measuring up to about 4.2 cm. There is stool in the colon but there is minimal gas into the colon. There are scattered air-fluid levels.  IMPRESSION: Findings are concerning for mid small bowel obstruction.   Electronically Signed   By: Esperanza Heir M.D.   On: 12/30/2012 10:04    Anti-infectives: Anti-infectives   Start     Dose/Rate Route Frequency Ordered Stop   12/26/12 0800  ertapenem (INVANZ) 1 g in sodium chloride 0.9 % 50 mL IVPB     1 g 100 mL/hr over 30 Minutes Intravenous Every 24 hours 12/25/12 1529 12/26/12 0820   12/25/12 1100  [MAR Hold]  ertapenem (INVANZ) 1 g in sodium chloride 0.9 % 50 mL IVPB     (On MAR Hold since 12/25/12 1112)   1 g 100 mL/hr over 30 Minutes Intravenous On call to O.R. 12/25/12 1045 12/25/12 1145   12/25/12 0800  cefOXitin (MEFOXIN) 2 g in dextrose 5 % 50 mL IVPB  Status:  Discontinued     2 g 100 mL/hr over 30 Minutes Intravenous On call to O.R. 12/25/12 0750 12/25/12 1544      Assessment/Plan: s/p Procedure(s): explor right groin, small bowel rescection, tissue repair right femoral hernia (Right) nausea and vomiting likely from prolonged ileus.  I  have recommended and ordered replacing the NG tube.  will continue NPO and place PICC for TPN.  no evidence of free air or leak.  also with cardiac abnormalities and v tach.  I will contact cardiology for recommendations for this.  LOS: 8 days    Lodema Pilot DAVID 12/30/2012

## 2012-12-30 NOTE — Progress Notes (Signed)
Physician notified of 31 beats of wide complex pvc w/ no pacing spikes during event.  Upon completion pacing rhythm resumed.  Pt asymptomatic and w/ no c/o pain/sob/distress.  Pt has c/o nausea and emesis overnight but has been better since am dose of dilaudid.  No emesis or nausea yet this shift.  Emesis emptied from emesis basin from night shift was brown/green-ish liquid.  Abd slightly distended but not tight and firm.

## 2012-12-30 NOTE — Progress Notes (Signed)
PARENTERAL NUTRITION CONSULT NOTE - INITIAL  Pharmacy Consult for TNA Indication: Ileus  Allergies  Allergen Reactions  . Hydrocodone Itching  . Cephalexin Itching and Swelling  . Doxycycline Other (See Comments)    Per pt: unknown  . Ofloxacin Other (See Comments)    Per pt: unknown  . Tape Other (See Comments)    Burns skin.   . Latex Rash    Patient Measurements: Height: 5\' 8"  (172.7 cm) Weight: 138 lb 7.2 oz (62.8 kg) IBW/kg (Calculated) : 63.9  Vital Signs: Temp: 98.5 F (36.9 C) (10/25 0529) Temp src: Oral (10/25 0529) BP: 114/58 mmHg (10/25 0529) Pulse Rate: 75 (10/25 0529) Intake/Output from previous day: 10/24 0701 - 10/25 0700 In: 2639.6 [P.O.:600; I.V.:1939.6; IV Piggyback:100] Out: 1565 [Urine:775; Emesis/NG output:750; Drains:40] Intake/Output from this shift:    Labs:  Recent Labs  12/28/12 0545  WBC 7.5  HGB 13.5  HCT 39.3  PLT 223     Recent Labs  12/28/12 0545 12/28/12 1116 12/29/12 0513  NA 139  --  142  K 3.9  --  4.1  CL 106  --  107  CO2 19  --  19  GLUCOSE 80  --  66*  BUN 13  --  13  CREATININE 0.61  --  0.59  CALCIUM 8.6  --  8.9  PREALBUMIN  --  8.6*  --    Estimated Creatinine Clearance: 57.5 ml/min (by C-G formula based on Cr of 0.59).   No results found for this basename: GLUCAP,  in the last 72 hours  Medical History: Past Medical History  Diagnosis Date  . Nonischemic cardiomyopathy     EF initially 20%; Last measurement up to 50% per echo in August of 2012  . CHF (congestive heart failure)   . LBBB (left bundle branch block)   . Mitral regurgitation   . Melanoma     right arm  . Hypothyroidism   . Erosive gastritis 08/2004  . Hyperplastic polyps of stomach 06/2002  . Barrett's esophagus 04/2008  . GERD (gastroesophageal reflux disease)   . Diverticulitis   . Anxiety disorder   . HTN (hypertension)   . ICD (implantable cardiac defibrillator) in place     BiV/ICD; s/p removal of ICD with insertion of BiV  PPM 04/26/12  . Hair thinning   . Unspecified sinusitis (chronic)   . Unspecified chronic bronchitis   . Depressive disorder, not elsewhere classified   . Restless legs syndrome (RLS)   . Chronic airway obstruction, not elsewhere classified   . Benign neoplasm of colon   . Other and unspecified hyperlipidemia   . Unspecified nasal polyp   . Pain in joint, lower leg   . Synovial cyst of popliteal space   . Complication of anesthesia     slow to wake up  . Pacemaker 04/26/2012  . Sleep apnea   . Other and unspecified hyperlipidemia   . Dysthymic disorder   . Palpitations     Medications:  Scheduled:  . digoxin  0.125 mg Intravenous Daily  . enoxaparin (LOVENOX) injection  40 mg Subcutaneous Q24H  . famotidine (PEPCID) IV  20 mg Intravenous Q12H  . feeding supplement (RESOURCE BREEZE)  1 Container Oral TID BM  . levothyroxine  50 mcg Intravenous Daily  . metoprolol  5 mg Intravenous Daily   Infusions:  . 0.9 % NaCl with KCl 20 mEq / L 75 mL/hr at 12/30/12 0502    Current Nutrition:  NPO with NG  tube  Nutritional Goals:  Per RD 10/24:  Kcal: 1650-1900  Protein: 75-90 g  Fluid: >1.7 L  Clinimix E 5/15 at goal rate of 75 ml/hr + 20% Lipids at 20ml/hr will provide 90g protein, 1758 kcal per day  Assessment: 79 yo female s/p small bowel resection and tissue repair of right femoral hernia on 10/20. Diet advanced to clears on 10/24 but had significant abdominal pain, nausea and vomiting, CCS suspects ileus. Back to NPO, place PICC and begin TNA on 10/25.  Glucose: serum glucoses low Electrolytes: wnl Renal function: wnl Hepatic function: wnl (10/17), check in am TG: check in am Prealb: low, 8.6 (10/23), check in am  Plan:  At 1800 today:  Start Clinimix E 5/15 at 40 ml/hr.  20% fat emulsion at 10 ml/hr.  Plan to advance as tolerated to the goal rate.  TNA to contain standard multivitamins and trace elements.  Reduce IVF to 74ml/hr.  Add CBG/SSI q8h .   TNA  lab panels on Mondays & Thursdays.  F/u daily.  Loralee Pacas, PharmD, BCPS Pager: [phone] 12/30/2012,1:05 PM

## 2012-12-30 NOTE — Progress Notes (Signed)
Pt had multiple episodes of vomiting overnight. Small amounts, yellowish tan in color. Unable to tolerate PO intake overnight. Some relief from PRN Zofran. Will continue to monitor. Aynsley Fleet, Lavone Orn, RN

## 2012-12-30 NOTE — Consult Note (Signed)
Primary Care Physician: Kimber Relic, MD Referring Physician:  Dr Jeannine Kitten is a 79 y.o. female with a h/o nonischemic CM (EF improved with CRT to 50%) with a [employer] BiV pacemaker in place who is admitted with GI difficulty. She is s/p [facility] repair with mesh by Dr. Avel Peace on 09/26/2012.  She has had a difficult post op coarse and is now admitted with SBO versus ileus.  She underwent exploration of the R groin with SB resection and partial omentectomy by Dr Derrell Lolling 12/25/12.  She is making slow improvement.  She continues to have ilius.  She has been observed to have wide complex arrhythmias on telemetry for which cardiology is now consulted.  Today, she denies symptoms of palpitations, chest pain, shortness of breath, lower extremity edema, dizziness, presyncope, syncope, or neurologic sequela. The patient is tolerating medications without difficulties and is otherwise without complaint today.   Past Medical History  Diagnosis Date  . Nonischemic cardiomyopathy     EF initially 20%; Last measurement up to 50% per echo in August of 2012  . CHF (congestive heart failure)   . LBBB (left bundle branch block)   . Mitral regurgitation   . Melanoma     right arm  . Hypothyroidism   . Erosive gastritis 08/2004  . Hyperplastic polyps of stomach 06/2002  . Barrett's esophagus 04/2008  . GERD (gastroesophageal reflux disease)   . Diverticulitis   . Anxiety disorder   . HTN (hypertension)   . ICD (implantable cardiac defibrillator) in place     BiV/ICD; s/p removal of ICD with insertion of BiV PPM 04/26/12  . Hair thinning   . Unspecified sinusitis (chronic)   . Unspecified chronic bronchitis   . Depressive disorder, not elsewhere classified   . Restless legs syndrome (RLS)   . Chronic airway obstruction, not elsewhere classified   . Benign neoplasm of colon   . Other and unspecified hyperlipidemia   . Unspecified nasal polyp   . Pain in joint, lower leg     . Synovial cyst of popliteal space   . Complication of anesthesia     slow to wake up  . Pacemaker 04/26/2012  . Sleep apnea   . Other and unspecified hyperlipidemia   . Dysthymic disorder   . Palpitations    Past Surgical History  Procedure Laterality Date  . Defibrillator insertion      s/p removal of previously implanted BiV ICD and insertion of a new BiV pacemaker on 04/26/12  . Left arm surgery    . Abdominal hysterectomy  1076    endometriosis  . Right breast lumpectomy  1988    Dr. Francina Ames  . Appendectomy    . Tonsillectomy    . Right knee surgery      arthroscopy: Dr. Simonne Come  . Excision of wen  1984    Dr. Valrie Hart  . Excise mole of lip  2001    Dr. Everlene Farrier  . Inguinal hernia repair Right 09/26/2012    Procedure: RIGHT INGUINAL HERNIA REPAIR WITH MESH;  Surgeon: Adolph Pollack, MD;  Location: [facility] OR;  Service: General;  Laterality: Right;  . Insertion of mesh Right 09/26/2012    Procedure: INSERTION OF MESH;  Surgeon: Adolph Pollack, MD;  Location: [facility] OR;  Service: General;  Laterality: Right;  . Inguinal hernia repair Right 12/25/2012    Procedure: explor right groin, small bowel rescection, tissue repair right femoral hernia;  Surgeon: Mikey Bussing  Grayce Sessions, MD;  Location: [facility] ORS;  Service: General;  Laterality: Right;    Current Facility-Administered Medications  Medication Dose Route Frequency Provider Last Rate Last Dose  . 0.9 % NaCl with KCl 20 mEq/ L  infusion   Intravenous Continuous Rollene Fare, RPH 75 mL/hr at 12/30/12 0502    . 0.9 % NaCl with KCl 20 mEq/ L  infusion   Intravenous Continuous Rollene Fare, RPH      . digoxin (LANOXIN) 0.25 MG/ML injection 0.125 mg  0.125 mg Intravenous Daily Meredith Pel, NP   0.125 mg at 12/30/12 1252  . enoxaparin (LOVENOX) injection 40 mg  40 mg Subcutaneous Q24H Ernestene Mention, MD   40 mg at 12/30/12 1249  . famotidine (PEPCID) IVPB 20 mg  20 mg Intravenous Q12H Sherrie George, PA-C   20 mg at  12/30/12 1249  . TPN (CLINIMIX-E) Adult   Intravenous Continuous TPN Rollene Fare, [facility]       And  . fat emulsion 20 % infusion 250 mL  250 mL Intravenous Continuous TPN Rollene Fare, RPH      . feeding supplement (RESOURCE BREEZE) (RESOURCE BREEZE) liquid 1 Container  1 Container Oral TID BM Lorraine Lax, RD   1 Container at 12/29/12 2015  . HYDROmorphone (DILAUDID) injection 0.5 mg  0.5 mg Intravenous Q4H PRN Meredith Pel, NP   0.5 mg at 12/30/12 1024  . insulin aspart (novoLOG) injection 0-9 Units  0-9 Units Subcutaneous Q8H Rollene Fare, RPH      . levothyroxine (SYNTHROID, LEVOTHROID) injection 50 mcg  50 mcg Intravenous Daily Meredith Pel, NP   50 mcg at 12/30/12 1256  . LORazepam (ATIVAN) injection 0.5 mg  0.5 mg Intravenous QHS PRN Meredith Pel, NP   0.5 mg at 12/28/12 2136  . menthol-cetylpyridinium (CEPACOL) lozenge 3 mg  1 lozenge Oral PRN Meredith Pel, NP   3 mg at 12/25/12 2027  . metoprolol (LOPRESSOR) injection 5 mg  5 mg Intravenous Daily Meredith Pel, NP   5 mg at 12/30/12 1248  . ondansetron (ZOFRAN) injection 4 mg  4 mg Intravenous Q6H PRN Meredith Pel, NP   4 mg at 12/30/12 1044   Facility-Administered Medications Ordered in Other Encounters  Medication Dose Route Frequency Provider Last Rate Last Dose  . 0.9 %  sodium chloride infusion  500 mL Intravenous Continuous Rachael Fee, MD        Allergies  Allergen Reactions  . Hydrocodone Itching  . Cephalexin Itching and Swelling  . Doxycycline Other (See Comments)    Per pt: unknown  . Ofloxacin Other (See Comments)    Per pt: unknown  . Tape Other (See Comments)    Burns skin.   . Latex Rash    History   Social History  . Marital Status: Single    Spouse Name: N/A    Number of Children: 0  . Years of Education: N/A   Occupational History  . Retired   .     Social History Main Topics  . Smoking status: Former Smoker    Types: Cigarettes    Quit date:  12/22/1993  . Smokeless tobacco: Never Used     Comment: Does'nt reall year quit   . Alcohol Use: No  . Drug Use: No  . Sexual Activity: No   Other Topics Concern  . Not on file   Social History Narrative  . No narrative  on file    Family History  Problem Relation Age of Onset  . Heart disease Other     maternal side  . Colon cancer Paternal Uncle   . Breast cancer Maternal Aunt   . Diabetes Neg Hx   . Stroke Father     ROS- All systems are reviewed and negative except as per the HPI above  Physical Exam: Filed Vitals:   12/28/12 2141 12/29/12 0616 12/29/12 2127 12/30/12 0529  BP: 150/56 162/57 133/55 114/58  Pulse: 63 55 65 75  Temp: 98.3 F (36.8 C) 97.6 F (36.4 C) 98.3 F (36.8 C) 98.5 F (36.9 C)  TempSrc: Oral Oral Oral Oral  Resp: 18 18 18 16   Height:      Weight:      SpO2: 100% 99% 100% 97%    GEN- The patient is chronically ill and thin appearing, alert and oriented x 3 today.   Head- normocephalic, atraumatic Eyes-  Sclera clear, conjunctiva pink Ears- hearing intact Oropharynx- clear Neck- supple, no JVP Lungs- Clear to ausculation bilaterally, normal work of breathing Heart- Regular rate and rhythm, no murmurs, rubs or gallops,   GI- soft, appropriately ttp, hypoactive BS Extremities- no clubbing, cyanosis, or edema MS- diffuse muscle atrophy Skin- no rash or lesion Psych- euthymic mood, full affect Neuro- strength and sensation are intact  EKG- sinus with BIV pacing  Assessment and Plan:  1. Wide complex rhythm I have reviewed telemetry extensively.  I think that most of the wide complex rhythm that is seen is actually her intrinsic (non biv paced) rhythm.  This will occur when her sinus rate goes above the tracking rate of her pacemaker or when her AV delay shortens to less than the rate of the pacemaker.  These two events will occur when adrenergic tone is increased.  There are also some episodes of documented NSVT as well.  For now, we  ill treat with IV lopressor.  I will also have her PPM interogated to see if adjustments can be made.  I think that the above is benign.  We will follow.  2. Nonischemic CM/ chronic systolic dysfunction EF previously improved with CRT. Would keep Is and Os about even.  Caution with IV fluid.  We will follow Please call with questions.

## 2012-12-30 NOTE — Progress Notes (Signed)
Met with Pt to answer any SNF questions and to see if she has made a decision.  Pt stated that she has yet to make a SNF decision.  Furthermore, Pt isn't sure that she will be going to SNF at d/c.  She stated that she wants to see how she progresses.  Pt stated that she has friends with whom she can stay, if need be.  Pt asking for more time to make a final decision.  Weekday CSW to follow.  ,[name], LCSWA Clinical Social Work [phone]

## 2012-12-31 ENCOUNTER — Inpatient Hospital Stay (HOSPITAL_COMMUNITY): Payer: Medicare Other

## 2012-12-31 DIAGNOSIS — I472 Ventricular tachycardia: Secondary | ICD-10-CM

## 2012-12-31 DIAGNOSIS — I4729 Other ventricular tachycardia: Secondary | ICD-10-CM

## 2012-12-31 DIAGNOSIS — J189 Pneumonia, unspecified organism: Secondary | ICD-10-CM

## 2012-12-31 DIAGNOSIS — D72829 Elevated white blood cell count, unspecified: Secondary | ICD-10-CM | POA: Diagnosis present

## 2012-12-31 LAB — CBC WITH DIFFERENTIAL/PLATELET
Basophils Relative: 0 % (ref 0–1)
Eosinophils Absolute: 0 10*3/uL (ref 0.0–0.7)
HCT: 43 % (ref 36.0–46.0)
Hemoglobin: 14.9 g/dL (ref 12.0–15.0)
Lymphocytes Relative: 7 % — ABNORMAL LOW (ref 12–46)
Lymphs Abs: 1.7 10*3/uL (ref 0.7–4.0)
MCH: 30.5 pg (ref 26.0–34.0)
MCHC: 34.7 g/dL (ref 30.0–36.0)
MCV: 88.1 fL (ref 78.0–100.0)
Monocytes Absolute: 1.4 10*3/uL — ABNORMAL HIGH (ref 0.1–1.0)
Neutro Abs: 20.5 10*3/uL — ABNORMAL HIGH (ref 1.7–7.7)
Neutrophils Relative %: 87 % — ABNORMAL HIGH (ref 43–77)
Platelets: 173 10*3/uL (ref 150–400)
WBC Morphology: INCREASED

## 2012-12-31 LAB — GLUCOSE, CAPILLARY
Glucose-Capillary: 98 mg/dL (ref 70–99)
Glucose-Capillary: 98 mg/dL (ref 70–99)
Glucose-Capillary: 87 mg/dL (ref 70–99)
Glucose-Capillary: 85 mg/dL (ref 70–99)

## 2012-12-31 LAB — URINALYSIS, ROUTINE W REFLEX MICROSCOPIC
Glucose, UA: NEGATIVE mg/dL
Ketones, ur: 15 mg/dL — AB
Leukocytes, UA: NEGATIVE
Nitrite: NEGATIVE
Specific Gravity, Urine: 1.025 (ref 1.005–1.030)
Urobilinogen, UA: 0.2 mg/dL (ref 0.0–1.0)
pH: 5 (ref 5.0–8.0)

## 2012-12-31 LAB — COMPREHENSIVE METABOLIC PANEL
Alkaline Phosphatase: 69 U/L (ref 39–117)
BUN: 23 mg/dL (ref 6–23)
Chloride: 108 mEq/L (ref 96–112)
Creatinine, Ser: 1 mg/dL (ref 0.50–1.10)
GFR calc Af Amer: 61 mL/min — ABNORMAL LOW (ref >90)
Glucose, Bld: 120 mg/dL — ABNORMAL HIGH (ref 70–99)
Potassium: 3.8 mEq/L (ref 3.5–5.1)
Total Bilirubin: 0.5 mg/dL (ref 0.3–1.2)

## 2012-12-31 LAB — PHOSPHORUS: Phosphorus: 3.7 mg/dL (ref 2.3–4.6)

## 2012-12-31 LAB — PREALBUMIN: Prealbumin: 7.6 mg/dL — ABNORMAL LOW (ref 17.0–34.0)

## 2012-12-31 MED ORDER — DEXTROSE 5 % IV SOLN
1.0000 g | Freq: Three times a day (TID) | INTRAVENOUS | Status: AC
  Administered 2012-12-31 – 2013-01-08 (×24): 1 g via INTRAVENOUS
  Filled 2012-12-31 (×26): qty 1

## 2012-12-31 MED ORDER — FAT EMULSION 20 % IV EMUL
250.0000 mL | INTRAVENOUS | Status: AC
  Administered 2012-12-31: 250 mL via INTRAVENOUS
  Filled 2012-12-31: qty 250

## 2012-12-31 MED ORDER — LACTATED RINGERS IV BOLUS (SEPSIS)
500.0000 mL | Freq: Once | INTRAVENOUS | Status: AC
  Administered 2012-12-31: 500 mL via INTRAVENOUS

## 2012-12-31 MED ORDER — VANCOMYCIN HCL 500 MG IV SOLR
500.0000 mg | Freq: Two times a day (BID) | INTRAVENOUS | Status: DC
  Administered 2012-12-31 – 2013-01-02 (×4): 500 mg via INTRAVENOUS
  Filled 2012-12-31 (×5): qty 500

## 2012-12-31 MED ORDER — POTASSIUM CHLORIDE IN NACL 20-0.9 MEQ/L-% IV SOLN
INTRAVENOUS | Status: AC
  Administered 2012-12-31: 23:00:00 via INTRAVENOUS
  Filled 2012-12-31: qty 1000

## 2012-12-31 MED ORDER — VANCOMYCIN HCL IN DEXTROSE 1-5 GM/200ML-% IV SOLN
1000.0000 mg | Freq: Once | INTRAVENOUS | Status: AC
  Administered 2012-12-31: 1000 mg via INTRAVENOUS
  Filled 2012-12-31: qty 200

## 2012-12-31 MED ORDER — TRACE MINERALS CR-CU-F-FE-I-MN-MO-SE-ZN IV SOLN
INTRAVENOUS | Status: AC
  Administered 2012-12-31: 21:00:00 via INTRAVENOUS
  Filled 2012-12-31: qty 1000

## 2012-12-31 MED ORDER — SODIUM CHLORIDE 0.9 % IV BOLUS (SEPSIS)
500.0000 mL | Freq: Once | INTRAVENOUS | Status: AC
  Administered 2012-12-31: 500 mL via INTRAVENOUS

## 2012-12-31 NOTE — Progress Notes (Signed)
SUBJECTIVE:  No SHOB. No orthopnea.  NG tube bothering her.  OBJECTIVE:   Vitals:   Filed Vitals:   12/30/12 0529 12/30/12 1459 12/30/12 2128 12/31/12 0535  BP: 114/58 106/52 98/48 113/53  Pulse: 75 102 89 90  Temp: 98.5 F (36.9 C) 98.8 F (37.1 C) 98.5 F (36.9 C) 98.7 F (37.1 C)  TempSrc: Oral Oral Oral Oral  Resp: 16 18  20   Height:      Weight:      SpO2: 97% 86% 100% 100%   I&O's:   Intake/Output Summary (Last 24 hours) at 12/31/12 0858 Last data filed at 12/31/12 0700  Gross per 24 hour  Intake    995 ml  Output   2910 ml  Net  -1915 ml   TELEMETRY: Reviewed telemetry pt in predominantly paced rhythm; short run of NSVT this AM:     PHYSICAL EXAM General: Thin, ill appearing Head:  Normal cephalic and atramatic  Lungs:  No wheezing Heart:   HRRR S1 S2    Extremities:  No edema.  DP +2 bilaterally Neuro: Alert and oriented X 3. Psych:  Normal affect, responds appropriately   LABS: Basic Metabolic Panel:  Recent Labs  16/10/96 0513 12/31/12 0500  NA 142 140  K 4.1 3.8  CL 107 108  CO2 19 22  GLUCOSE 66* 120*  BUN 13 23  CREATININE 0.59 1.00  CALCIUM 8.9 8.9  MG  --  1.7  PHOS  --  3.7   Liver Function Tests:  Recent Labs  12/31/12 0500  AST 17  ALT 21  ALKPHOS 69  BILITOT 0.5  PROT 5.9*  ALBUMIN 2.8*   No results found for this basename: LIPASE, AMYLASE,  in the last 72 hours CBC:  Recent Labs  12/31/12 0500  WBC 23.6*  NEUTROABS 20.5*  HGB 14.9  HCT 43.0  MCV 88.1  PLT 173   Cardiac Enzymes: No results found for this basename: CKTOTAL, CKMB, CKMBINDEX, TROPONINI,  in the last 72 hours BNP: No components found with this basename: POCBNP,  D-Dimer: No results found for this basename: DDIMER,  in the last 72 hours Hemoglobin A1C: No results found for this basename: HGBA1C,  in the last 72 hours Fasting Lipid Panel: No results found for this basename: CHOL, HDL, LDLCALC, TRIG, CHOLHDL, LDLDIRECT,  in the last 72  hours Thyroid Function Tests: No results found for this basename: TSH, T4TOTAL, FREET3, T3FREE, THYROIDAB,  in the last 72 hours Anemia Panel: No results found for this basename: VITAMINB12, FOLATE, FERRITIN, TIBC, IRON, RETICCTPCT,  in the last 72 hours Coag Panel:   Lab Results  Component Value Date   INR 1.08 09/19/2012    RADIOLOGY: Dg Abd 1 View  12/28/2012   CLINICAL DATA:  Postop ileus.  EXAM: ABDOMEN - 1 VIEW  COMPARISON:  12/24/2012  FINDINGS: Mild gaseous distention of scattered small bowel loops. Moderate stool throughout the colon. NG tube is in the stomach, unchanged. No free air organomegaly.  IMPRESSION: Mild gaseous distention of bowel, predominately small bowel loops.   Electronically Signed   By: Charlett Nose M.D.   On: 12/28/2012 13:45   Ct Abdomen Pelvis W Contrast  12/24/2012   CLINICAL DATA:  Right inguinal mass. Radiographic evidence of a partial small bowel obstruction.  EXAM: CT ABDOMEN AND PELVIS WITH CONTRAST  TECHNIQUE: Multidetector CT imaging of the abdomen and pelvis was performed using the standard protocol following bolus administration of intravenous contrast.  CONTRAST:  50mL  OMNIPAQUE IOHEXOL 300 MG/ML SOLN, OMNIPAQUE IOHEXOL 300 MG/ML SOLN  COMPARISON:  Standard radiographs of the abdomen, 12/1912. Abdomen and pelvis CT, 12/11/2012.  FINDINGS: There is a right inguinal hernia that extends anterior to the common femoral vessels. It contains a loop of small bowel. The a fair in the fairly loops portions of this herniated loops of small bowel are narrowed. This is a distal loop of ileum. The small bowel proximal to the hernia is dilated with air-fluid levels. Maximum dilation is 3 cm.  There is a small amount of ascites seen predominately adjacent to the liver and in the posterior pelvic recess. No free air.  Small right a minimal left pleural effusions. Minor dependent subsegmental atelectasis. The lung bases are otherwise clear.  Nonspecific low-density  tracks along the portal branches in the liver. The liver is otherwise unremarkable. Normal spleen. The gallbladder is unremarkable. There is no bile duct dilation. Normal pancreas. No adrenal masses.  Small stable low-density renal lesions, likely cysts. Kidneys are otherwise unremarkable. Normal ureters. The bladder is only mildly distended. There is no bladder mass or wall thickening. The bladder neck is low consistent with a cyst seal.  The uterus is surgically absent. There are no pelvic masses. No adenopathy. Atherosclerotic changes are noted throughout the abdominal aorta and its branch vessels. There is an ectasia of the abdominal aorta.  Extensive sigmoid colon diverticulosis is noted. There are few additional scattered colonic diverticula. Stool in the colon is mildly increased. There is no colonic wall thickening or inflammatory change.  Degenerative changes are noted of the visualized spine. No osteoblastic or osteolytic lesions.  IMPRESSION: 1. A partial small bowel obstruction is caused by a right inguinal region hernia the contains a loop of distal ileum. This bowel loop is narrowed as it enters and exits the hernia confirming at as is source of the partial obstruction. 2. Small amount of ascites. 3. Small right minimal left pleural effusions. Minor associated dependent subsegmental lung base atelectasis. 4. Chronic findings include extensive sigmoid colon diverticulosis, low-density renal lesions that are likely cysts and atherosclerotic cysts and a ectasia of the double aorta.   Electronically Signed   By: Amie Portland M.D.   On: 12/24/2012 13:06   Ct Abdomen Pelvis W Contrast  12/14/2012   ADDENDUM REPORT: 12/14/2012 09:51  ADDENDUM: I have reviewed the multiple prior CT scans of the pelvis. The fat containing abnormality in the right inguinal region could represent a femoral hernia or a lipoma.  Published literature regarding distinguishing inguinal from femoral hernias on CT scans describes  considerable overlap in the appearance of each type of hernia.   Electronically Signed   By: Geanie Cooley M.D.   On: 12/14/2012 09:51   12/14/2012   CLINICAL DATA:  Right groin pain and soft tissue swelling. Right inguinal hernia repair on 09/26/2012.  EXAM: CT ABDOMEN AND PELVIS WITH CONTRAST  TECHNIQUE: Multidetector CT imaging of the abdomen and pelvis was performed using the standard protocol following bolus administration of intravenous contrast.  CONTRAST:  50mL OMNIPAQUE IOHEXOL 300 MG/ML SOLN, OMNIPAQUE IOHEXOL 300 MG/ML SOLN  COMPARISON:  CT scans dated 09/24/2011 and 10/04/2008  FINDINGS: There is a recurrent right inguinal hernia containing only fat and vessels, measuring 5.8 x 5.0 x 4.0 cm.  The liver, biliary tree, spleen, pancreas, adrenal glands, and kidneys are normal except for a 3 mm cyst in the mid left kidney and a 9 mm cyst in the mid right kidney.  The  bowel appears normal. Bladder is normal. The uterus and ovaries appear within removed.  No acute osseous abnormality. Multilevel degenerative disc and joint disease in the lower lumbar spine.  IMPRESSION: Recurrent right inguinal hernia containing fat and small blood vessels.  Electronically Signed: By: Geanie Cooley M.D. On: 12/11/2012 15:59   Dg Abd 2 Views  12/30/2012   CLINICAL DATA:  Hernia repair and small bowel resections on 12/2012, with vomiting currently  EXAM: ABDOMEN - 2 VIEW  COMPARISON:  12/28/2012, 12/1912  FINDINGS: In the mid abdomen, there are numerous loops of prominent small bowel measuring up to about 4.2 cm. There is stool in the colon but there is minimal gas into the colon. There are scattered air-fluid levels.  IMPRESSION: Findings are concerning for mid small bowel obstruction.   Electronically Signed   By: Esperanza Heir M.D.   On: 12/30/2012 10:04   Dg Abd 2 Views  12/24/2012   CLINICAL DATA:  Small bowel obstruction versus adynamic ileus.  EXAM: ABDOMEN - 2 VIEW  COMPARISON:  12/22/2012  FINDINGS: There  are few air-fluid levels on the erect radiograph, and mildly prominent loops of small bowel. Stool and air is seen within a nondilated colon. There is no free air.  A nasogastric tube passes below the diaphragm to curl within the proximal stomach.  IMPRESSION: 1. Mild prominence of small bowel loops with air-fluid levels. This has shown further improvement from the prior exam and may reflect a residual, low grade partial small bowel obstruction or, more likely, an adynamic ileus. 2. No free air. No new abnormality.   Electronically Signed   By: Amie Portland M.D.   On: 12/24/2012 08:13   Acute Abdominal Series  12/22/2012   CLINICAL DATA:  General abdominal pain  EXAM: ACUTE ABDOMEN SERIES (ABDOMEN 2 VIEW & CHEST 1 VIEW)  COMPARISON:  Chest radiograph 12/20/2012  FINDINGS: Left-sided pacemaker with 3 continuously does unchanged. Two additional non connected leads are again demonstrated just superior to the generator pack. There is mild scarring at the lung bases which is unchanged.  There are several short air-fluid levels within the small bowel. On the supine exam, there are dilated loops of small up to 3.0 cm which is decreased from 3.5 cm on prior. There is gas and stool in the rectum. No intraperitoneal free air on the supine exam.  IMPRESSION: Improvement in small bowel obstruction versus ileus pattern. No evidence of high-grade obstruction.   Electronically Signed   By: Genevive Bi M.D.   On: 12/22/2012 19:16   Dg Abd Acute W/chest  12/21/2012   CLINICAL DATA:  Abdominal pain.  EXAM: ACUTE ABDOMEN SERIES (ABDOMEN 2 VIEW & CHEST 1 VIEW)  COMPARISON:  Most recent chest x-ray 09/19/2012  FINDINGS: Normal cardiomediastinal silhouette. Transvenous pacer with AICD lead good position. There is no free air. The bowel gas pattern is abnormal with dilated loops of small bowel displaying air-fluid levels. Early or partial small bowel obstruction not excluded; no renal calculi are evident. Negative osseous  structures. Moderate stool burden on the right. Ovoid density right upper quadrant may represent a pill.  IMPRESSION: Negative abdominal radiographs.  No acute cardiopulmonary disease.   Electronically Signed   By: Davonna Belling M.D.   On: 12/21/2012 00:31      ASSESSMENT: NSVT, biV pacer, hernia  PLAN:  Pacer adjusted last night.  Palpitations significantly better. Continue IV metoprolol for now.  Spoke to Careers adviser.  OK to increase IVF.  According to current  orders, she should already be getting NS 150 cc/hr.  She was only receiving 75cc/hr.  Nurse to increase fluids now.  I have written for a 500 cc NS bolus.  Her last EF in Jan was 45-50%.  She takes no Lasix rarely at home.  I think a CHF exacerbation is unlikely given the amount of fluid being removed from her NG tube.  I have asked her to tell her nurse if she has any SHOB or trouble lying flat.    Corky Crafts., MD  12/31/2012  8:58 AM

## 2012-12-31 NOTE — Procedures (Signed)
Peripherally Inserted Central Catheter/Midline Placement  The IV Nurse has discussed with the patient and/or persons authorized to consent for the patient, the purpose of this procedure and the potential benefits and risks involved with this procedure.  The benefits include less needle sticks, lab draws from the catheter and patient may be discharged home with the catheter.  Risks include, but not limited to, infection, bleeding, blood clot (thrombus formation), and puncture of an artery; nerve damage and irregular heat beat.  Alternatives to this procedure were also discussed.  PICC/Midline Placement Documentation  PICC / Midline Double Lumen 12/31/12 PICC Right Basilic 41 cm 0 cm (Active)  Indication for Insertion or Continuance of Line Administration of hyperosmolar/irritating solutions (i.e. TPN, Vancomycin, etc.) 12/31/2012  8:00 PM  Exposed Catheter (cm) 0 cm 12/31/2012  8:00 PM  Site Assessment Clean;Dry;Intact 12/31/2012  8:00 PM  Lumen #1 Status Flushed;Saline locked;Blood return noted 12/31/2012  8:00 PM  Lumen #2 Status Flushed;Saline locked;Blood return noted 12/31/2012  8:00 PM  Dressing Type Transparent 12/31/2012  8:00 PM  Dressing Status Clean;Dry;Intact;Antimicrobial disc in place 12/31/2012  8:00 PM  Dressing Intervention New dressing 12/31/2012  8:00 PM  Dressing Change Due 01/07/13 12/31/2012  8:00 PM       Ronie Spies [facility] 12/31/2012, 8:16 PM

## 2012-12-31 NOTE — Progress Notes (Signed)
ANTIBIOTIC CONSULT NOTE - INITIAL  Pharmacy Consult for Vancomycin, renal adjustment of antibiotics Indication: suspected PNA  Allergies  Allergen Reactions  . Hydrocodone Itching  . Cephalexin Itching and Swelling  . Doxycycline Other (See Comments)    Per pt: unknown  . Ofloxacin Other (See Comments)    Per pt: unknown  . Tape Other (See Comments)    Burns skin.   . Latex Rash    Patient Measurements: Height: 5\' 8"  (172.7 cm) Weight: 138 lb 7.2 oz (62.8 kg) IBW/kg (Calculated) : 63.9  Vital Signs: Temp: 98.7 F (37.1 C) (10/26 0535) Temp src: Oral (10/26 0535) BP: 113/53 mmHg (10/26 0535) Pulse Rate: 90 (10/26 0535) Intake/Output from previous day: 10/25 0701 - 10/26 0700 In: 995 [I.V.:895; IV Piggyback:100] Out: 2910 [Emesis/NG output:2900; Drains:10] Intake/Output from this shift:    Labs:  Recent Labs  12/29/12 0513 12/31/12 0500  WBC  --  23.6*  HGB  --  14.9  PLT  --  173  CREATININE 0.59 1.00    Medical History: Past Medical History  Diagnosis Date  . Nonischemic cardiomyopathy     EF initially 20%; Last measurement up to 50% per echo in August of 2012  . CHF (congestive heart failure)   . LBBB (left bundle branch block)   . Mitral regurgitation   . Melanoma     right arm  . Hypothyroidism   . Erosive gastritis 08/2004  . Hyperplastic polyps of stomach 06/2002  . Barrett's esophagus 04/2008  . GERD (gastroesophageal reflux disease)   . Diverticulitis   . Anxiety disorder   . HTN (hypertension)   . ICD (implantable cardiac defibrillator) in place     BiV/ICD; s/p removal of ICD with insertion of BiV PPM 04/26/12  . Hair thinning   . Unspecified sinusitis (chronic)   . Unspecified chronic bronchitis   . Depressive disorder, not elsewhere classified   . Restless legs syndrome (RLS)   . Chronic airway obstruction, not elsewhere classified   . Benign neoplasm of colon   . Other and unspecified hyperlipidemia   . Unspecified nasal polyp   .  Pain in joint, lower leg   . Synovial cyst of popliteal space   . Complication of anesthesia     slow to wake up  . Pacemaker 04/26/2012  . Sleep apnea   . Other and unspecified hyperlipidemia   . Dysthymic disorder   . Palpitations     Assessment: 79 yo female s/p small bowel resection and tissue repair of right femoral hernia on 10/20. Plan to start TNA (awaiting PICC placement). On 10/26, pt with rising WBC and CXR with patchy bilateral infiltrates concerning for multifocal PNA. MD order to start Vancomycin per pharmacy along with Aztreonam.   10/20 >> Ertapenem >> 10/21 10/26 >> Vanc x8d >>  10/26 >> Aztreonam x 8d >>  Tmax: afeb WBCs: up to 23.6K Renal: Scr 1, CG 46, N 53 ml/min  NO cultures yet  Goal of Therapy:  Vancomycin trough level 15-20 mcg/ml  Plan:   Vancomycin 1g then 500 mg IV q12h  Change aztreonam to 1g IV q8h   Pharmacy will f/u  Geoffry Paradise, PharmD, BCPS Pager: [phone] 11:33 AM Pharmacy #: [id number]

## 2012-12-31 NOTE — Consult Note (Addendum)
Requesting physician: Dr. Lodema Pilot, surgery  Primary Care Physician: Kimber Relic, MD  Reason for consultation: Pneumonia   History of Present Illness: Patient is a 79 year old white woman initially admitted to the hospital on 10/17 with complaints of nausea, vomiting and abdominal pain. She was subsequently found on CT scan to have an incarcerated femoral hernia with small bowel obstruction and was taken to the operating room on 12/25/12 where she was found to have a strangulated right femoral hernia with small bowel obstruction. She has had a post op ileus that has been slow to resolve. Today she had a brisk increase in her WBCs from 7.5-23, because of this a chest x-ray was done that showed evidence for pneumonia. We have been asked to see her in consultation. Her past medical history is significant for nonischemic cardiomyopathy with an ejection fraction around 45%. She has a pacer in place. She has been evaluated by cardiology this admission for wide complex arrhythmias that they believe is due to her pacing.    Allergies:   Allergies  Allergen Reactions  . Hydrocodone Itching  . Cephalexin Itching and Swelling  . Doxycycline Other (See Comments)    Per pt: unknown  . Ofloxacin Other (See Comments)    Per pt: unknown  . Tape Other (See Comments)    Burns skin.   . Latex Rash      Past Medical History  Diagnosis Date  . Nonischemic cardiomyopathy     EF initially 20%; Last measurement up to 50% per echo in August of 2012  . CHF (congestive heart failure)   . LBBB (left bundle branch block)   . Mitral regurgitation   . Melanoma     right arm  . Hypothyroidism   . Erosive gastritis 08/2004  . Hyperplastic polyps of stomach 06/2002  . Barrett's esophagus 04/2008  . GERD (gastroesophageal reflux disease)   . Diverticulitis   . Anxiety disorder   . HTN (hypertension)   . ICD (implantable cardiac defibrillator) in place     BiV/ICD; s/p removal of ICD with insertion  of BiV PPM 04/26/12  . Hair thinning   . Unspecified sinusitis (chronic)   . Unspecified chronic bronchitis   . Depressive disorder, not elsewhere classified   . Restless legs syndrome (RLS)   . Chronic airway obstruction, not elsewhere classified   . Benign neoplasm of colon   . Other and unspecified hyperlipidemia   . Unspecified nasal polyp   . Pain in joint, lower leg   . Synovial cyst of popliteal space   . Complication of anesthesia     slow to wake up  . Pacemaker 04/26/2012  . Sleep apnea   . Other and unspecified hyperlipidemia   . Dysthymic disorder   . Palpitations     Past Surgical History  Procedure Laterality Date  . Defibrillator insertion      s/p removal of previously implanted BiV ICD and insertion of a new BiV pacemaker on 04/26/12  . Left arm surgery    . Abdominal hysterectomy  1076    endometriosis  . Right breast lumpectomy  1988    Dr. Francina Ames  . Appendectomy    . Tonsillectomy    . Right knee surgery      arthroscopy: Dr. Simonne Come  . Excision of wen  1984    Dr. Valrie Hart  . Excise mole of lip  2001    Dr. Everlene Farrier  . Inguinal hernia repair Right 09/26/2012  Procedure: RIGHT INGUINAL HERNIA REPAIR WITH MESH;  Surgeon: Adolph Pollack, MD;  Location: [facility] OR;  Service: General;  Laterality: Right;  . Insertion of mesh Right 09/26/2012    Procedure: INSERTION OF MESH;  Surgeon: Adolph Pollack, MD;  Location: [facility] OR;  Service: General;  Laterality: Right;  . Inguinal hernia repair Right 12/25/2012    Procedure: explor right groin, small bowel rescection, tissue repair right femoral hernia;  Surgeon: Ernestene Mention, MD;  Location: [facility] ORS;  Service: General;  Laterality: Right;    Scheduled Meds: . aztreonam  1 g Intravenous Q8H  . digoxin  0.125 mg Intravenous Daily  . enoxaparin (LOVENOX) injection  40 mg Subcutaneous Q24H  . famotidine (PEPCID) IV  20 mg Intravenous Q12H  . feeding supplement (RESOURCE BREEZE)  1 Container Oral TID BM   . insulin aspart  0-9 Units Subcutaneous Q8H  . levothyroxine  50 mcg Intravenous Daily  . metoprolol  5 mg Intravenous Q6H  . [START ON 01/01/2013] vancomycin  500 mg Intravenous Q12H   Continuous Infusions: . 0.9 % NaCl with KCl 20 mEq / L 125 mL/hr at 12/31/12 1303  . 0.9 % NaCl with KCl 20 mEq / L    . Marland KitchenTPN (CLINIMIX-E) Adult     And  . fat emulsion    . Marland KitchenTPN (CLINIMIX-E) Adult     And  . fat emulsion     PRN Meds:.HYDROmorphone (DILAUDID) injection, LORazepam, menthol-cetylpyridinium, ondansetron (ZOFRAN) IV  Social History:  reports that she quit smoking about 19 years ago. Her smoking use included Cigarettes. She smoked 0.00 packs per day. She has never used smokeless tobacco. She reports that she does not drink alcohol or use illicit drugs.  Family History  Problem Relation Age of Onset  . Heart disease Other     maternal side  . Colon cancer Paternal Uncle   . Breast cancer Maternal Aunt   . Diabetes Neg Hx   . Stroke Father     Review of Systems:  Constitutional: Denies fever, chills, diaphoresis, appetite change and fatigue.  HEENT: Denies photophobia, eye pain, redness, hearing loss, ear pain, congestion, sore throat, rhinorrhea, sneezing, mouth sores, trouble swallowing, neck pain, neck stiffness and tinnitus.   Respiratory: Denies SOB, DOE, cough, chest tightness,  and wheezing.   Cardiovascular: Denies chest pain, palpitations and leg swelling.  Gastrointestinal: Denies nausea, vomiting, abdominal pain, diarrhea, constipation, blood in stool and abdominal distention.  Genitourinary: Denies dysuria, urgency, frequency, hematuria, flank pain and difficulty urinating.  Endocrine: Denies: hot or cold intolerance, sweats, changes in hair or nails, polyuria, polydipsia. Musculoskeletal: Denies myalgias, back pain, joint swelling, arthralgias and gait problem.  Skin: Denies pallor, rash and wound.  Neurological: Denies dizziness, seizures, syncope, weakness,  light-headedness, numbness and headaches.  Hematological: Denies adenopathy. Easy bruising, personal or family bleeding history  Psychiatric/Behavioral: Denies suicidal ideation, mood changes, confusion, nervousness, sleep disturbance and agitation   Physical Exam: Blood pressure 129/55, pulse 76, temperature 98.7 F (37.1 C), temperature source Oral, resp. rate 18, height 5\' 8"  (1.727 m), weight 62.8 kg (138 lb 7.2 oz), SpO2 91.00%. General: Alert, awake, oriented x3, appears cachectic, NG tube in place. HEENT: Normocephalic, atraumatic, pupils equal round and reactive to light, wears corrective lenses, NG tube in place to suction. Neck: Supple, no JVD, no lymphadenopathy, no bruits, no goiter. Cardiovascular: Regular rate and rhythm. Lungs: Bilateral rhonchi, no crackles. Abdomen: Soft, slightly distended, hypoactive bowel sounds. Extremities: No clubbing, cyanosis or edema, positive  pedal pulses. Neurologic: Grossly intact and nonfocal.  Labs on Admission:  Results for orders placed during the hospital encounter of 12/22/12 (from the past 48 hour(s))  GLUCOSE, CAPILLARY     Status: Abnormal   Collection Time    12/30/12  9:30 PM      Result Value Range   Glucose-Capillary 121 (*) 70 - 99 mg/dL  CBC WITH DIFFERENTIAL     Status: Abnormal   Collection Time    12/31/12  5:00 AM      Result Value Range   WBC 23.6 (*) 4.0 - 10.5 K/uL   RBC 4.88  3.87 - 5.11 MIL/uL   Hemoglobin 14.9  12.0 - 15.0 g/dL   HCT 16.1  09.6 - 04.5 %   MCV 88.1  78.0 - 100.0 fL   MCH 30.5  26.0 - 34.0 pg   MCHC 34.7  30.0 - 36.0 g/dL   RDW 40.9  81.1 - 91.4 %   Platelets 173  150 - 400 K/uL   Neutrophils Relative % 87 (*) 43 - 77 %   Lymphocytes Relative 7 (*) 12 - 46 %   Monocytes Relative 6  3 - 12 %   Eosinophils Relative 0  0 - 5 %   Basophils Relative 0  0 - 1 %   Neutro Abs 20.5 (*) 1.7 - 7.7 K/uL   Lymphs Abs 1.7  0.7 - 4.0 K/uL   Monocytes Absolute 1.4 (*) 0.1 - 1.0 K/uL   Eosinophils  Absolute 0.0  0.0 - 0.7 K/uL   Basophils Absolute 0.0  0.0 - 0.1 K/uL   WBC Morphology INCREASED BANDS (>20% BANDS)    COMPREHENSIVE METABOLIC PANEL     Status: Abnormal   Collection Time    12/31/12  5:00 AM      Result Value Range   Sodium 140  135 - 145 mEq/L   Potassium 3.8  3.5 - 5.1 mEq/L   Chloride 108  96 - 112 mEq/L   CO2 22  19 - 32 mEq/L   Glucose, Bld 120 (*) 70 - 99 mg/dL   BUN 23  6 - 23 mg/dL   Creatinine, Ser 7.82  0.50 - 1.10 mg/dL   Comment: DELTA CHECK NOTED   Calcium 8.9  8.4 - 10.5 mg/dL   Total Protein 5.9 (*) 6.0 - 8.3 g/dL   Albumin 2.8 (*) 3.5 - 5.2 g/dL   AST 17  0 - 37 U/L   ALT 21  0 - 35 U/L   Alkaline Phosphatase 69  39 - 117 U/L   Total Bilirubin 0.5  0.3 - 1.2 mg/dL   GFR calc non Af Amer 53 (*) >90 mL/min   GFR calc Af Amer 61 (*) >90 mL/min   Comment: (NOTE)     The eGFR has been calculated using the CKD EPI equation.     This calculation has not been validated in all clinical situations.     eGFR's persistently <90 mL/min signify possible Chronic Kidney     Disease.  PREALBUMIN     Status: Abnormal   Collection Time    12/31/12  5:00 AM      Result Value Range   Prealbumin 7.6 (*) 17.0 - 34.0 mg/dL   Comment: Performed at [facility]  MAGNESIUM     Status: None   Collection Time    12/31/12  5:00 AM      Result Value Range   Magnesium 1.7  1.5 -  2.5 mg/dL  PHOSPHORUS     Status: None   Collection Time    12/31/12  5:00 AM      Result Value Range   Phosphorus 3.7  2.3 - 4.6 mg/dL  TRIGLYCERIDES     Status: None   Collection Time    12/31/12  5:00 AM      Result Value Range   Triglycerides 65  <150 mg/dL   Comment: Performed at [facility]  GLUCOSE, CAPILLARY     Status: None   Collection Time    12/31/12  5:33 AM      Result Value Range   Glucose-Capillary 93  70 - 99 mg/dL  GLUCOSE, CAPILLARY     Status: None   Collection Time    12/31/12  8:07 AM      Result Value Range   Glucose-Capillary 98  70 - 99  mg/dL  GLUCOSE, CAPILLARY     Status: None   Collection Time    12/31/12 12:14 PM      Result Value Range   Glucose-Capillary 98  70 - 99 mg/dL  URINALYSIS, ROUTINE W REFLEX MICROSCOPIC     Status: Abnormal   Collection Time    12/31/12  1:21 PM      Result Value Range   Color, Urine YELLOW  YELLOW   APPearance CLOUDY (*) CLEAR   Specific Gravity, Urine 1.025  1.005 - 1.030   pH 5.0  5.0 - 8.0   Glucose, UA NEGATIVE  NEGATIVE mg/dL   Hgb urine dipstick NEGATIVE  NEGATIVE   Bilirubin Urine SMALL (*) NEGATIVE   Ketones, ur 15 (*) NEGATIVE mg/dL   Protein, ur NEGATIVE  NEGATIVE mg/dL   Urobilinogen, UA 0.2  0.0 - 1.0 mg/dL   Nitrite NEGATIVE  NEGATIVE   Leukocytes, UA NEGATIVE  NEGATIVE   Comment: MICROSCOPIC NOT DONE ON URINES WITH NEGATIVE PROTEIN, BLOOD, LEUKOCYTES, NITRITE, OR GLUCOSE <1000 mg/dL.    Radiological Exams on Admission: Dg Chest 2 View  12/31/2012   CLINICAL DATA:  Increased white blood count  EXAM: CHEST  2 VIEW  COMPARISON:  09/19/2012, 12/22/2012  FINDINGS: No change in cardiac pacer. NG tube is identified with tip over the stomach. Heart size and vascular pattern are normal. There is patchy infiltrate in the right middle lobe, right lower lobe, and left lower lobe.  IMPRESSION: Patchy bilateral infiltrates concerning for multifocal pneumonia.   Electronically Signed   By: Esperanza Heir M.D.   On: 12/31/2012 09:40   Dg Abd 2 Views  12/31/2012   CLINICAL DATA:  Abdominal distension  EXAM: ABDOMEN - 2 VIEW  COMPARISON:  12/30/2012  FINDINGS: NG tube in place noted. Distended small bowel loops in mid abdomen with multiple air-fluid levels highly suspicious for small bowel obstruction. Again noted skin staples in right lower quadrant. No free abdominal air. Dual lead cardiac pacemaker leads partially visualized.  IMPRESSION: Distended small bowel loops in mid abdomen with multiple air-fluid levels highly suspicious for small bowel obstruction. NG tube in place.    Electronically Signed   By: Natasha Mead M.D.   On: 12/31/2012 09:39   Dg Abd 2 Views  12/30/2012   CLINICAL DATA:  Hernia repair and small bowel resections on 12/2012, with vomiting currently  EXAM: ABDOMEN - 2 VIEW  COMPARISON:  12/28/2012, 12/1912  FINDINGS: In the mid abdomen, there are numerous loops of prominent small bowel measuring up to about 4.2 cm. There is stool in the colon but  there is minimal gas into the colon. There are scattered air-fluid levels.  IMPRESSION: Findings are concerning for mid small bowel obstruction.   Electronically Signed   By: Esperanza Heir M.D.   On: 12/30/2012 10:04    Assessment/Plan Principal Problem:   Femoral hernia with gangrene and obstruction Active Problems:   Unspecified intestinal obstruction   Malnutrition of moderate degree   Moderate malnutrition   HCAP (healthcare-associated pneumonia)   Leukocytosis, unspecified   Hospital-acquired pneumonia -Start vancomycin and aztreonam. -Check blood and sputum cultures. -Check strep pneumonia legionella urine antigens.  Leukocytosis -Related to pneumonia. -Follow trend with antibiotics  Protein caloric malnutrition -It appears plans are to place PICC line for TNA.  Femoral hernia -As per surgery.  Time Spent on Consultation: 60 minutes  HERNANDEZ ACOSTA,ESTELA [facility]  [phone] 12/31/2012, 3:15 PM

## 2012-12-31 NOTE — Progress Notes (Addendum)
6 Days Post-Op  Subjective: Stomach feels better.  Complaining of NG tube. No flatus or BM  Objective: Vital signs in last 24 hours: Temp:  [98.5 F (36.9 C)-98.8 F (37.1 C)] 98.7 F (37.1 C) (10/26 0535) Pulse Rate:  [89-102] 90 (10/26 0535) Resp:  [18-20] 20 (10/26 0535) BP: (98-113)/(48-53) 113/53 mmHg (10/26 0535) SpO2:  [86 %-100 %] 100 % (10/26 0535) Last BM Date: 12/26/12  Intake/Output from previous day: 01/08/2023 0701 - 10/26 0700 In: 995 [I.V.:895; IV Piggyback:100] Out: 2910 [Emesis/NG output:2900; Drains:10] Intake/Output this shift:    General appearance: alert, cooperative and no distress Resp: clear to auscultation bilaterally Cardio: normal rate, regular GI: soft, no significant tenderness today, mild distension but improved from yesterday.  +BS, no peritoneal signs. NG with of bilious output Extremities: SCD's bilat LE  Lab Results:   Recent Labs  12/31/12 0500  WBC 23.6*  HGB 14.9  HCT 43.0  PLT 173   BMET  Recent Labs  12/29/12 0513 12/31/12 0500  NA 142 140  K 4.1 3.8  CL 107 108  CO2 19 22  GLUCOSE 66* 120*  BUN 13 23  CREATININE 0.59 1.00  CALCIUM 8.9 8.9   PT/INR No results found for this basename: LABPROT, INR,  in the last 72 hours ABG No results found for this basename: PHART, PCO2, PO2, HCO3,  in the last 72 hours  Studies/Results: Dg Abd 2 Views  2013/01/07   CLINICAL DATA:  Hernia repair and small bowel resections on 12/2012, with vomiting currently  EXAM: ABDOMEN - 2 VIEW  COMPARISON:  12/28/2012, 12/1912  FINDINGS: In the mid abdomen, there are numerous loops of prominent small bowel measuring up to about 4.2 cm. There is stool in the colon but there is minimal gas into the colon. There are scattered air-fluid levels.  IMPRESSION: Findings are concerning for mid small bowel obstruction.   Electronically Signed   By: Esperanza Heir M.D.   On: 01/07/13 10:04    Anti-infectives: Anti-infectives   Start     Dose/Rate  Route Frequency Ordered Stop   12/26/12 0800  ertapenem (INVANZ) 1 g in sodium chloride 0.9 % 50 mL IVPB     1 g 100 mL/hr over 30 Minutes Intravenous Every 24 hours 12/25/12 1529 12/26/12 0820   12/25/12 1100  [MAR Hold]  ertapenem (INVANZ) 1 g in sodium chloride 0.9 % 50 mL IVPB     (On MAR Hold since 12/25/12 1112)   1 g 100 mL/hr over 30 Minutes Intravenous On call to O.R. 12/25/12 1045 12/25/12 1145   12/25/12 0800  cefOXitin (MEFOXIN) 2 g in dextrose 5 % 50 mL IVPB  Status:  Discontinued     2 g 100 mL/hr over 30 Minutes Intravenous On call to O.R. 12/25/12 0750 12/25/12 1544      Assessment/Plan: s/p Procedure(s): explor right groin, small bowel rescection, tissue repair right femoral hernia (Right) she feels better today.  significant amount of NG output.  Her abdomen distension and pain are improved.  no evidence of bowel leakage or anastamotic leak on exam.  I have recommended PICC for nutrition.  check abdomen and cxr today.  will increase fluids since Cr up and we have removed almost 3L from NG tube.  check cxr and UA for source of risking wbc since incision looks good.  if spikes fevers or if WBC up, may need CT abdomen.  LOS: 9 days    [name] DAVID 12/31/2012 Due to rising  wbc and recent CXR, I have asked the hospitalist to evaluate for treatment of her pneumonia.

## 2012-12-31 NOTE — Progress Notes (Signed)
NG tube advanced per MD's order. Vwilliams,rn.

## 2012-12-31 NOTE — Progress Notes (Signed)
NUTRITION FOLLOW UP  Intervention:   1. Advance TNA to goal per pharmacy. At goal rate, will provide 100% of estimated nutrition needs.  2. RD with monitor per nutrition plan of care.   Nutrition Dx:   Inadequate oral intake related to inability to eat as evidenced by NPO, ongoing.   Goal:   Patient will meet >/=90% of estimated nutrition needs, not met, improved with TNA  Monitor:   Weight; no recent weight Diet advancement; pt NPO, TNA Labs; Electrolytes WNL, Prealbumin 7.6  Assessment:   Patient is s/p small bowel resection and tissue repair of right femoral hernia on 10/20. Pt's diet was advanced to clear liquids 10/22 and NG tube was removed, but had multiple episodes of vomiting, increase in abdominal pain. CCS suspects ileus. Patient currently NPO, TNA started 10/25. NG tube replaced today.   TNA: Clinimix 5/15 goal rate 75 ml/hr with 20% lipids at 40ml/hr, which will provide 1758 kcal, 90 g protein (100% estimated needs). Currently running at 40 ml/hr (1162 kcal, 48 g protein).    Height: Ht Readings from Last 1 Encounters:  12/26/12 5\' 8"  (1.727 m)    Weight Status:   Wt Readings from Last 1 Encounters:  12/26/12 138 lb 7.2 oz (62.8 kg)    Re-estimated needs:  Kcal: 1650-1900 kcal Protein: 75-90 g  Fluid: 1.7 L/day  Skin: Abdominal and groin incisions  Diet Order: NPO   Intake/Output Summary (Last 24 hours) at 12/31/12 1635 Last data filed at 12/31/12 1300  Gross per 24 hour  Intake    995 ml  Output    910 ml  Net     85 ml    Last BM: 10/22   Labs:   Recent Labs Lab 12/28/12 0545 12/29/12 0513 12/31/12 0500  NA 139 142 140  K 3.9 4.1 3.8  CL 106 107 108  CO2 19 19 22   BUN 13 13 23   CREATININE 0.61 0.59 1.00  CALCIUM 8.6 8.9 8.9  MG  --   --  1.7  PHOS  --   --  3.7  GLUCOSE 80 66* 120*    CBG (last 3)   Recent Labs  12/31/12 0533 12/31/12 0807 12/31/12 1214  GLUCAP 93 98 98    Scheduled Meds: . aztreonam  1 g Intravenous  Q8H  . digoxin  0.125 mg Intravenous Daily  . enoxaparin (LOVENOX) injection  40 mg Subcutaneous Q24H  . famotidine (PEPCID) IV  20 mg Intravenous Q12H  . feeding supplement (RESOURCE BREEZE)  1 Container Oral TID BM  . insulin aspart  0-9 Units Subcutaneous Q8H  . levothyroxine  50 mcg Intravenous Daily  . metoprolol  5 mg Intravenous Q6H  . [START ON 01/01/2013] vancomycin  500 mg Intravenous Q12H    Continuous Infusions: . 0.9 % NaCl with KCl 20 mEq / L 125 mL/hr at 12/31/12 1303  . 0.9 % NaCl with KCl 20 mEq / L    . Marland KitchenTPN (CLINIMIX-E) Adult     And  . fat emulsion    . Marland KitchenTPN (CLINIMIX-E) Adult     And  . fat emulsion      Linnell Fulling, RD, LDN Pager #: [phone] After-Hours Pager #: [phone]

## 2012-12-31 NOTE — Progress Notes (Signed)
Explained benefits and risk of PICC line insertion.  Pt unsure about PICC line and wants to wait and think about it.  Encouraged to watch PICC video.  Floor RN to set up.  Will follow up tomorrow.

## 2012-12-31 NOTE — Progress Notes (Signed)
PARENTERAL NUTRITION CONSULT NOTE - INITIAL  Pharmacy Consult for TNA Indication: Ileus  Allergies  Allergen Reactions  . Hydrocodone Itching  . Cephalexin Itching and Swelling  . Doxycycline Other (See Comments)    Per pt: unknown  . Ofloxacin Other (See Comments)    Per pt: unknown  . Tape Other (See Comments)    Burns skin.   . Latex Rash    Patient Measurements: Height: 5\' 8"  (172.7 cm) Weight: 138 lb 7.2 oz (62.8 kg) IBW/kg (Calculated) : 63.9  Vital Signs: Temp: 98.7 F (37.1 C) (10/26 0535) Temp src: Oral (10/26 0535) BP: 113/53 mmHg (10/26 0535) Pulse Rate: 90 (10/26 0535) Intake/Output from previous day: 10/25 0701 - 10/26 0700 In: 995 [I.V.:895; IV Piggyback:100] Out: 2910 [Emesis/NG output:2900; Drains:10] Intake/Output from this shift:    Labs:  Recent Labs  12/31/12 0500  WBC 23.6*  HGB 14.9  HCT 43.0  PLT 173     Recent Labs  12/28/12 1116 12/29/12 0513 12/31/12 0500  NA  --  142 140  K  --  4.1 3.8  CL  --  107 108  CO2  --  19 22  GLUCOSE  --  66* 120*  BUN  --  13 23  CREATININE  --  0.59 1.00  CALCIUM  --  8.9 8.9  MG  --   --  1.7  PHOS  --   --  3.7  PROT  --   --  5.9*  ALBUMIN  --   --  2.8*  AST  --   --  17  ALT  --   --  21  ALKPHOS  --   --  69  BILITOT  --   --  0.5  PREALBUMIN 8.6*  --   --    Estimated Creatinine Clearance: 46 ml/min (by C-G formula based on Cr of 1).    Recent Labs  12/30/12 2130 12/31/12 0533 12/31/12 0807  GLUCAP 121* 93 98    Medical History: Past Medical History  Diagnosis Date  . Nonischemic cardiomyopathy     EF initially 20%; Last measurement up to 50% per echo in August of 2012  . CHF (congestive heart failure)   . LBBB (left bundle branch block)   . Mitral regurgitation   . Melanoma     right arm  . Hypothyroidism   . Erosive gastritis 08/2004  . Hyperplastic polyps of stomach 06/2002  . Barrett's esophagus 04/2008  . GERD (gastroesophageal reflux disease)   .  Diverticulitis   . Anxiety disorder   . HTN (hypertension)   . ICD (implantable cardiac defibrillator) in place     BiV/ICD; s/p removal of ICD with insertion of BiV PPM 04/26/12  . Hair thinning   . Unspecified sinusitis (chronic)   . Unspecified chronic bronchitis   . Depressive disorder, not elsewhere classified   . Restless legs syndrome (RLS)   . Chronic airway obstruction, not elsewhere classified   . Benign neoplasm of colon   . Other and unspecified hyperlipidemia   . Unspecified nasal polyp   . Pain in joint, lower leg   . Synovial cyst of popliteal space   . Complication of anesthesia     slow to wake up  . Pacemaker 04/26/2012  . Sleep apnea   . Other and unspecified hyperlipidemia   . Dysthymic disorder   . Palpitations     Medications:  Scheduled:  . digoxin  0.125 mg Intravenous Daily  .  enoxaparin (LOVENOX) injection  40 mg Subcutaneous Q24H  . famotidine (PEPCID) IV  20 mg Intravenous Q12H  . feeding supplement (RESOURCE BREEZE)  1 Container Oral TID BM  . insulin aspart  0-9 Units Subcutaneous Q8H  . levothyroxine  50 mcg Intravenous Daily  . metoprolol  5 mg Intravenous Q6H   Infusions:  . 0.9 % NaCl with KCl 20 mEq / L 75 mL/hr at 12/30/12 1904  . Marland KitchenTPN (CLINIMIX-E) Adult     And  . fat emulsion      Current Nutrition:  NPO with NG tube  Nutritional Goals:  Per RD 10/24:  Kcal: 1650-1900  Protein: 75-90 g  Fluid: >1.7 L  Clinimix E 5/15 at goal rate of 75 ml/hr + 20% Lipids at 29ml/hr will provide 90g protein, 1758 kcal per day  Assessment: 79 yo female s/p small bowel resection and tissue repair of right femoral hernia on 10/20. Diet advanced to clears on 10/24 but had significant abdominal pain, nausea and vomiting, CCS suspects ileus. Back to NPO, order to place PICC and begin TNA on 10/25.  Per IV team, patient refused PICC placement on 10/25, but stated she would be agreeable to placement today, 10/26  Glucose: CBGs at goal of <  150 Electrolytes: wnl Renal function: wnl Hepatic function: wnl  TG: pending Prealb: low, 8.6 (10/23), pending today  Plan:  At 1800 today, If PICC placed:  Start Clinimix E 5/15 at 40 ml/hr.  20% fat emulsion at 10 ml/hr.  Plan to advance as tolerated to the goal rate.  TNA to contain standard multivitamins and trace elements.  Reduce IVF to 14ml/hr.  Add CBG/SSI q8h .   TNA lab panels on Mondays & Thursdays.  F/u daily.  Loralee Pacas, PharmD, BCPS Pager: [phone] 12/31/2012,8:21 AM

## 2013-01-01 ENCOUNTER — Inpatient Hospital Stay (HOSPITAL_COMMUNITY): Payer: Medicare Other

## 2013-01-01 DIAGNOSIS — R109 Unspecified abdominal pain: Secondary | ICD-10-CM

## 2013-01-01 DIAGNOSIS — E876 Hypokalemia: Secondary | ICD-10-CM

## 2013-01-01 LAB — LEGIONELLA ANTIGEN, URINE

## 2013-01-01 LAB — COMPREHENSIVE METABOLIC PANEL
ALT: 17 U/L (ref 0–35)
AST: 16 U/L (ref 0–37)
Alkaline Phosphatase: 74 U/L (ref 39–117)
BUN: 17 mg/dL (ref 6–23)
CO2: 22 mEq/L (ref 19–32)
Calcium: 8.5 mg/dL (ref 8.4–10.5)
Chloride: 108 mEq/L (ref 96–112)
Creatinine, Ser: 0.65 mg/dL (ref 0.50–1.10)
GFR calc Af Amer: >90 mL/min (ref >90)
GFR calc non Af Amer: 83 mL/min — ABNORMAL LOW (ref >90)
Glucose, Bld: 143 mg/dL — ABNORMAL HIGH (ref 70–99)
Sodium: 139 mEq/L (ref 135–145)
Total Bilirubin: 0.3 mg/dL (ref 0.3–1.2)
Total Protein: 5.2 g/dL — ABNORMAL LOW (ref 6.0–8.3)

## 2013-01-01 LAB — CBC WITH DIFFERENTIAL/PLATELET
Basophils Absolute: 0 10*3/uL (ref 0.0–0.1)
Basophils Relative: 0 % (ref 0–1)
Eosinophils Absolute: 0.2 10*3/uL (ref 0.0–0.7)
Hemoglobin: 12.5 g/dL (ref 12.0–15.0)
Lymphocytes Relative: 6 % — ABNORMAL LOW (ref 12–46)
Lymphs Abs: 1.5 10*3/uL (ref 0.7–4.0)
MCH: 30 pg (ref 26.0–34.0)
MCHC: 33.9 g/dL (ref 30.0–36.0)
MCV: 88.7 fL (ref 78.0–100.0)
Monocytes Absolute: 1.5 10*3/uL — ABNORMAL HIGH (ref 0.1–1.0)
Neutrophils Relative %: 87 % — ABNORMAL HIGH (ref 43–77)
Platelets: 148 10*3/uL — ABNORMAL LOW (ref 150–400)
RBC: 4.16 MIL/uL (ref 3.87–5.11)
RDW: 13.5 % (ref 11.5–15.5)

## 2013-01-01 LAB — PHOSPHORUS: Phosphorus: 2 mg/dL — ABNORMAL LOW (ref 2.3–4.6)

## 2013-01-01 LAB — HIV ANTIBODY (ROUTINE TESTING W REFLEX): HIV: NONREACTIVE

## 2013-01-01 LAB — EXPECTORATED SPUTUM ASSESSMENT W GRAM STAIN, RFLX TO RESP C

## 2013-01-01 LAB — PREALBUMIN: Prealbumin: 5.1 mg/dL — ABNORMAL LOW (ref 17.0–34.0)

## 2013-01-01 LAB — GLUCOSE, CAPILLARY: Glucose-Capillary: 122 mg/dL — ABNORMAL HIGH (ref 70–99)

## 2013-01-01 LAB — MAGNESIUM: Magnesium: 1.6 mg/dL (ref 1.5–2.5)

## 2013-01-01 LAB — TRIGLYCERIDES: Triglycerides: 89 mg/dL (ref <150)

## 2013-01-01 MED ORDER — POTASSIUM CHLORIDE IN NACL 20-0.9 MEQ/L-% IV SOLN
INTRAVENOUS | Status: DC
  Administered 2013-01-01 – 2013-01-03 (×2): via INTRAVENOUS
  Filled 2013-01-01 (×3): qty 1000

## 2013-01-01 MED ORDER — IOHEXOL 300 MG/ML  SOLN
80.0000 mL | Freq: Once | INTRAMUSCULAR | Status: AC | PRN
  Administered 2013-01-01: 80 mL via INTRAVENOUS

## 2013-01-01 MED ORDER — SODIUM CHLORIDE 0.9 % IJ SOLN
10.0000 mL | Freq: Two times a day (BID) | INTRAMUSCULAR | Status: DC
  Administered 2013-01-05 – 2013-01-07 (×4): 10 mL

## 2013-01-01 MED ORDER — FAT EMULSION 20 % IV EMUL
250.0000 mL | INTRAVENOUS | Status: AC
  Administered 2013-01-01: 250 mL via INTRAVENOUS
  Filled 2013-01-01: qty 250

## 2013-01-01 MED ORDER — TRACE MINERALS CR-CU-F-FE-I-MN-MO-SE-ZN IV SOLN
INTRAVENOUS | Status: AC
  Administered 2013-01-01: 17:00:00 via INTRAVENOUS
  Filled 2013-01-01: qty 2000

## 2013-01-01 MED ORDER — IOHEXOL 300 MG/ML  SOLN
25.0000 mL | INTRAMUSCULAR | Status: AC
  Administered 2013-01-01 (×2): 25 mL via ORAL

## 2013-01-01 MED ORDER — MAGNESIUM SULFATE 40 MG/ML IJ SOLN
2.0000 g | Freq: Once | INTRAMUSCULAR | Status: AC
  Administered 2013-01-01: 2 g via INTRAVENOUS
  Filled 2013-01-01: qty 50

## 2013-01-01 MED ORDER — POTASSIUM PHOSPHATE DIBASIC 3 MMOLE/ML IV SOLN
15.0000 mmol | Freq: Once | INTRAVENOUS | Status: AC
  Administered 2013-01-01: 15 mmol via INTRAVENOUS
  Filled 2013-01-01: qty 5

## 2013-01-01 MED ORDER — POTASSIUM CHLORIDE 10 MEQ/100ML IV SOLN
10.0000 meq | INTRAVENOUS | Status: AC
  Administered 2013-01-01 (×4): 10 meq via INTRAVENOUS
  Filled 2013-01-01 (×4): qty 100

## 2013-01-01 MED ORDER — SODIUM CHLORIDE 0.9 % IJ SOLN
10.0000 mL | INTRAMUSCULAR | Status: DC | PRN
  Administered 2013-01-01 – 2013-01-04 (×4): 10 mL

## 2013-01-01 NOTE — Progress Notes (Signed)
PARENTERAL NUTRITION CONSULT NOTE   Pharmacy Consult for TNA Indication: Ileus  Allergies  Allergen Reactions  . Hydrocodone Itching  . Cephalexin Itching and Swelling  . Doxycycline Other (See Comments)    Per pt: unknown  . Ofloxacin Other (See Comments)    Per pt: unknown  . Tape Other (See Comments)    Burns skin.   . Latex Rash    Patient Measurements: Height: 5\' 8"  (172.7 cm) Weight: 138 lb 7.2 oz (62.8 kg) IBW/kg (Calculated) : 63.9  Vital Signs: Temp: 98.5 F (36.9 C) (10/27 0510) Temp src: Oral (10/27 0510) BP: 142/68 mmHg (10/27 0510) Pulse Rate: 71 (10/27 0510) Intake/Output from previous day: 10/26 0701 - 10/27 0700 In: 2024.8 [I.V.:1263.3; IV Piggyback:250; TPN:511.5] Out: 2770 [Urine:850; Emesis/NG output:1900; Drains:20] Intake/Output from this shift:    Labs:  Recent Labs  12/31/12 0500 01/01/13 0614  WBC 23.6* 24.2*  HGB 14.9 12.5  HCT 43.0 36.9  PLT 173 148*     Recent Labs  12/31/12 0500 01/01/13 0614  NA 140 139  K 3.8 3.4*  CL 108 108  CO2 22 22  GLUCOSE 120* 143*  BUN 23 17  CREATININE 1.00 0.65  CALCIUM 8.9 8.5  MG 1.7 1.6  PHOS 3.7 2.0*  PROT 5.9* 5.2*  ALBUMIN 2.8* 2.3*  AST 17 16  ALT 21 17  ALKPHOS 69 74  BILITOT 0.5 0.3  PREALBUMIN 7.6*  --   TRIG 65  --    Estimated Creatinine Clearance: 57.5 ml/min (by C-G formula based on Cr of 0.65).    Recent Labs  12/31/12 1214 12/31/12 1702 12/31/12 2129  GLUCAP 98 87 85    Medical History: Past Medical History  Diagnosis Date  . Nonischemic cardiomyopathy     EF initially 20%; Last measurement up to 50% per echo in August of 2012  . CHF (congestive heart failure)   . LBBB (left bundle branch block)   . Mitral regurgitation   . Melanoma     right arm  . Hypothyroidism   . Erosive gastritis 08/2004  . Hyperplastic polyps of stomach 06/2002  . Barrett's esophagus 04/2008  . GERD (gastroesophageal reflux disease)   . Diverticulitis   . Anxiety disorder   .  HTN (hypertension)   . ICD (implantable cardiac defibrillator) in place     BiV/ICD; s/p removal of ICD with insertion of BiV PPM 04/26/12  . Hair thinning   . Unspecified sinusitis (chronic)   . Unspecified chronic bronchitis   . Depressive disorder, not elsewhere classified   . Restless legs syndrome (RLS)   . Chronic airway obstruction, not elsewhere classified   . Benign neoplasm of colon   . Other and unspecified hyperlipidemia   . Unspecified nasal polyp   . Pain in joint, lower leg   . Synovial cyst of popliteal space   . Complication of anesthesia     slow to wake up  . Pacemaker 04/26/2012  . Sleep apnea   . Other and unspecified hyperlipidemia   . Dysthymic disorder   . Palpitations     Medications:  Scheduled:  . aztreonam  1 g Intravenous Q8H  . digoxin  0.125 mg Intravenous Daily  . enoxaparin (LOVENOX) injection  40 mg Subcutaneous Q24H  . famotidine (PEPCID) IV  20 mg Intravenous Q12H  . feeding supplement (RESOURCE BREEZE)  1 Container Oral TID BM  . insulin aspart  0-9 Units Subcutaneous Q8H  . levothyroxine  50 mcg Intravenous Daily  .  metoprolol  5 mg Intravenous Q6H  . vancomycin  500 mg Intravenous Q12H   Infusions:  . 0.9 % NaCl with KCl 20 mEq / L 25 mL/hr at 12/31/12 2252  . Marland KitchenTPN (CLINIMIX-E) Adult 40 mL/hr at 12/31/12 2046   And  . fat emulsion 250 mL (12/31/12 2047)    Current Nutrition:  NPO with NG tube  Nutritional Goals:  Per RD 10/24:  Kcal: 1650-1900  Protein: 75-90 g  Fluid: >1.7 L  Clinimix E 5/15 at goal rate of 75 ml/hr + 20% Lipids at 69ml/hr will provide 90g protein, 1758 kcal per day  Assessment: 79 yo female s/p small bowel resection and tissue repair of right femoral hernia on 10/20. Diet advanced to clears on 10/24 but had significant abdominal pain, nausea and vomiting, 10/26 abd film revealed dilated small bowel loops suspicious for SBO. Patient NPO, order to place PICC and begin TNA on 10/25. Initially (Per IV team),  patient refused PICC placement on 10/25, but was agreeable to placement 10/26  10/27: Day #2 TPN, NG output = 1.9L/24h  Glucose: CBGs at goal of < 150, no SSI insulin required Electrolytes: K = 3.4, Phos = 2, Mg = 1.6, Corr Ca WNL Renal function: wnl, UOP appears adequate Hepatic function: wnl  TG: pending Prealb: low, 8.6 (10/23), pending today  Plan:   Increase Clinimix E 5/15 to 60 ml/hr (titrate to goal of 87ml/h as tolerated).  Monitor for refeeding, small decrease in phos (? Mild refeeding)  20% fat emulsion at 8 ml/hr (to keep fat calories to <30% total kcals).  TNA to contain standard multivitamins and trace elements.  Replace phos (and potassium) with Kphos x 1 (also provide K+)  Replace magnesium 2gm IVPB x 1 (at lower end normal)  Reduce IVF to [facility]  Add CBG/SSI q8h .   TNA lab panels on Mondays & Thursdays.  Labs in am  Juliette Alcide, PharmD, BCPS.   Pager: [phone] 01/01/2013,7:25 AM

## 2013-01-01 NOTE — Progress Notes (Addendum)
Went in room to give medications and the patients NG tube had came out. Paged [facility] to get further orders. Patient is in no distress, just very anxious.  Will continue to monitor patient. [facility] Pod D   Patient does not want NG tube placed tonight. Spoke with [employer], she said to monitor patient and if any N/V or stomach discomfort occurred during night we would put NG tube back in. Will continue to monitor patient for any discomfort. [facility] D

## 2013-01-01 NOTE — Progress Notes (Addendum)
[facility] PROGRESS NOTE CONSULT  [name] RUE:[id number] DOB: [birth year] DOA: 12/22/2012 PCP: Kimber Relic, MD  Assessment/Plan:  Hospital-acquired pneumonia  -Start vancomycin and aztreonam.  -Blood and sputum cultures pending. -Strep pneumonia legionella urine antigens pending.  Leukocytosis  -?Related to pneumonia.  -Follow trend with antibiotics. -Agree with CT abdomen ordered by [facility] to follow for any potential complication s/p OR.  Protein caloric malnutrition  -It appears plans are to place PICC line for TNA.   Wide-Complex Tachycardia -As per cardiology.  Femoral hernia  -As per surgery.   Code Status: Full Code Family Communication: Patient only  Disposition Plan: To be determined   Antibiotics:  Vancomycin 12/31/2012-->  Aztreonam 12/31/2012-->   Subjective: Frustrated with slowly resolving ileus. No real complaints.  Objective: Filed Vitals:   12/31/12 1320 12/31/12 2100 12/31/12 2336 01/01/13 0510  BP: 129/55 126/52 154/76 142/68  Pulse: 76 79 79 71  Temp: 98.7 F (37.1 C) 98.5 F (36.9 C)  98.5 F (36.9 C)  TempSrc: Oral Oral  Oral  Resp: 18 18  18   Height:      Weight:      SpO2: 91% 95%  91%    Intake/Output Summary (Last 24 hours) at 01/01/13 1002 Last data filed at 01/01/13 0700  Gross per 24 hour  Intake 2024.83 ml  Output   2770 ml  Net -745.17 ml   Filed Weights   12/22/12 1632 12/26/12 1656  Weight: 63.9 kg (140 lb 14 oz) 62.8 kg (138 lb 7.2 oz)    Exam:   General:  AA Ox3  Cardiovascular: RRR  Respiratory: bilateral ronchi  Abdomen: S/NT/ND/hypoactive BS  Extremities: no C/C/E   Neurologic:  Grossly intact and non-focal  Data Reviewed: Basic Metabolic Panel:  Recent Labs Lab 12/27/12 0655 12/28/12 0545 12/29/12 0513 12/31/12 0500 01/01/13 0614  NA 134* 139 142 140 139  K 4.1 3.9 4.1 3.8 3.4*  CL 103 106 107 108 108  CO2 19 19 19 22 22   GLUCOSE 75 80 66* 120* 143*  BUN 13 13 13 23  17   CREATININE 0.61 0.61 0.59 1.00 0.65  CALCIUM 8.4 8.6 8.9 8.9 8.5  MG  --   --   --  1.7 1.6  PHOS  --   --   --  3.7 2.0*   Liver Function Tests:  Recent Labs Lab 12/31/12 0500 01/01/13 0614  AST 17 16  ALT 21 17  ALKPHOS 69 74  BILITOT 0.5 0.3  PROT 5.9* 5.2*  ALBUMIN 2.8* 2.3*   No results found for this basename: LIPASE, AMYLASE,  in the last 168 hours No results found for this basename: AMMONIA,  in the last 168 hours CBC:  Recent Labs Lab 12/26/12 0408 12/27/12 0655 12/28/12 0545 12/31/12 0500 01/01/13 0614  WBC 6.9 6.2 7.5 23.6* 24.2*  NEUTROABS  --   --   --  20.5* 21.0*  HGB 14.5 15.1* 13.5 14.9 12.5  HCT 41.8 43.2 39.3 43.0 36.9  MCV 89.3 88.2 88.1 88.1 88.7  PLT 247 223 223 173 148*   Cardiac Enzymes: No results found for this basename: CKTOTAL, CKMB, CKMBINDEX, TROPONINI,  in the last 168 hours BNP (last 3 results) No results found for this basename: PROBNP,  in the last 8760 hours CBG:  Recent Labs Lab 12/31/12 0807 12/31/12 1214 12/31/12 1702 12/31/12 2129 01/01/13 0818  GLUCAP 98 98 87 85 122*    Recent Results (from the past 240 hour(s))  CULTURE, BLOOD (  ROUTINE X 2)     Status: None   Collection Time    12/31/12 12:00 PM      Result Value Range Status   Specimen Description BLOOD RIGHT ARM   Final   Special Requests BOTTLES DRAWN AEROBIC AND ANAEROBIC 2 CC EA   Final   Culture  Setup Time     Final   Value: 12/31/2012 17:46     Performed at [facility]   Culture     Final   Value:        BLOOD CULTURE RECEIVED NO GROWTH TO DATE CULTURE WILL BE HELD FOR 5 DAYS BEFORE ISSUING A FINAL NEGATIVE REPORT     Performed at [facility]   Report Status PENDING   Incomplete  CULTURE, BLOOD (ROUTINE X 2)     Status: None   Collection Time    12/31/12 12:15 PM      Result Value Range Status   Specimen Description BLOOD LEFT HAND   Final   Special Requests BOTTLES DRAWN AEROBIC AND ANAEROBIC 2 CC EA   Final   Culture   Setup Time     Final   Value: 12/31/2012 17:46     Performed at [facility]   Culture     Final   Value:        BLOOD CULTURE RECEIVED NO GROWTH TO DATE CULTURE WILL BE HELD FOR 5 DAYS BEFORE ISSUING A FINAL NEGATIVE REPORT     Performed at [facility]   Report Status PENDING   Incomplete  CULTURE, EXPECTORATED SPUTUM-ASSESSMENT     Status: None   Collection Time    01/01/13  5:52 AM      Result Value Range Status   Specimen Description SPUTUM   Final   Special Requests NONE   Final   Sputum evaluation     Final   Value: THIS SPECIMEN IS ACCEPTABLE. RESPIRATORY CULTURE REPORT TO FOLLOW.   Report Status 01/01/2013 FINAL   Final     Studies: Dg Chest 2 View  12/31/2012   CLINICAL DATA:  Increased white blood count  EXAM: CHEST  2 VIEW  COMPARISON:  09/19/2012, 12/22/2012  FINDINGS: No change in cardiac pacer. NG tube is identified with tip over the stomach. Heart size and vascular pattern are normal. There is patchy infiltrate in the right middle lobe, right lower lobe, and left lower lobe.  IMPRESSION: Patchy bilateral infiltrates concerning for multifocal pneumonia.   Electronically Signed   By: Esperanza Heir M.D.   On: 12/31/2012 09:40   Dg Chest Port 1 View  12/31/2012   CLINICAL DATA:  PICC line placement  EXAM: PORTABLE CHEST - 1 VIEW  COMPARISON:  12/31/2012  FINDINGS: Right-sided PICC line tip: SVC. No pneumothorax.  Nasogastric tube tip: Stomach fundus.  Interstitial and patchy airspace opacities similar to prior exam. Heart size within normal limits.  IMPRESSION: 1. Right-sided PICC line tip: SVC. Otherwise stable.   Electronically Signed   By: Herbie Baltimore M.D.   On: 12/31/2012 20:28   Dg Abd 2 Views  12/31/2012   CLINICAL DATA:  Abdominal distension  EXAM: ABDOMEN - 2 VIEW  COMPARISON:  12/30/2012  FINDINGS: NG tube in place noted. Distended small bowel loops in mid abdomen with multiple air-fluid levels highly suspicious for small bowel obstruction.  Again noted skin staples in right lower quadrant. No free abdominal air. Dual lead cardiac pacemaker leads partially visualized.  IMPRESSION: Distended small bowel loops  in mid abdomen with multiple air-fluid levels highly suspicious for small bowel obstruction. NG tube in place.   Electronically Signed   By: Natasha Mead M.D.   On: 12/31/2012 09:39    Scheduled Meds: . aztreonam  1 g Intravenous Q8H  . digoxin  0.125 mg Intravenous Daily  . enoxaparin (LOVENOX) injection  40 mg Subcutaneous Q24H  . famotidine (PEPCID) IV  20 mg Intravenous Q12H  . feeding supplement (RESOURCE BREEZE)  1 Container Oral TID BM  . insulin aspart  0-9 Units Subcutaneous Q8H  . iohexol  25 mL Oral Q1 Hr x 2  . levothyroxine  50 mcg Intravenous Daily  . metoprolol  5 mg Intravenous Q6H  . potassium chloride  10 mEq Intravenous Q1 Hr x 4  . potassium phosphate IVPB (mmol)  15 mmol Intravenous Once  . vancomycin  500 mg Intravenous Q12H   Continuous Infusions: . 0.9 % NaCl with KCl 20 mEq / L 25 mL/hr at 12/31/12 2252  . 0.9 % NaCl with KCl 20 mEq / L    . Marland KitchenTPN (CLINIMIX-E) Adult 40 mL/hr at 12/31/12 2046   And  . fat emulsion 250 mL (12/31/12 2047)  . Marland KitchenTPN (CLINIMIX-E) Adult     And  . fat emulsion      Principal Problem:   Femoral hernia with gangrene and obstruction Active Problems:   Unspecified intestinal obstruction   Malnutrition of moderate degree   Moderate malnutrition   HCAP (healthcare-associated pneumonia)   Leukocytosis, unspecified    Time spent: 35 minutes    HERNANDEZ ACOSTA,ESTELA  [facility] Pager [phone]  If 7PM-7AM, please contact night-coverage at [email or web address], password [facility] 01/01/2013, 10:02 AM  LOS: 10 days

## 2013-01-01 NOTE — Progress Notes (Signed)
Occupational Therapy Treatment Patient Details Name: [name] MRN: [id number] DOB: April 04, 1932 Today's Date: 01/01/2013 Time: 4696-2952 OT Time Calculation (min): 18 min  OT Assessment / Plan / Recommendation       OT comments  Awake upon arrival and agreeable to participation in skilled ot.  Toileting, including transfer with min guard a/s.  Lb dressing in un supported sitting with s.  Verbalized frustration due to wanting to eat and drink and can not.    Follow Up Recommendations  SNF;Supervision/Assistance - 24 hour           Equipment Recommendations  None recommended by OT        Frequency Min 2X/week   Progress towards OT Goals Progress towards OT goals: Progressing toward goals  Plan Discharge plan remains appropriate    Precautions / Restrictions Precautions Precautions: Fall;Other (comment) (NPO)   Pertinent Vitals/Pain Denies pain    ADL  Grooming: Performed;Wash/dry hands;Set up Where Assessed - Grooming: Supported sitting Lower Body Dressing: Performed;Supervision/safety Where Assessed - Lower Body Dressing: Unsupported sitting Toilet Transfer: Performed;Min guard Toilet Transfer Method: Stand pivot;Sit to Barista: Materials engineer and Hygiene: Performed;Supervision/safety Where Assessed - Engineer, mining and Hygiene: Standing Transfers/Ambulation Related to ADLs: cues for hand placement during pivot, and cues for management of tubes/lines ADL Comments: able to complete all components of toileting with min guard/s.  able to don/doff socks by crossing l/r legs over knees with s.  reviewed energy conservation tech. and safety with functional transfers.  pt. able to verbalize understanding and demonstrate after review       OT Goals(current goals can now be found in the care plan section)    Visit Information  Last OT Received On: 01/01/13    Subjective Data   "i want to eat  and drink now"          Cognition  Cognition Arousal/Alertness: Awake/alert Behavior During Therapy: [facility] for tasks assessed/performed Overall Cognitive Status: Within Functional Limits for tasks assessed    Mobility  Bed Mobility Details for Bed Mobility Assistance: mod I with hob slightly elevated Transfers Transfers: Sit to Stand;Stand to Sit Sit to Stand: 5: Supervision;4: Min guard;From bed Stand to Sit: 5: Supervision;4: Min guard;With armrests;To chair/3-in-1               End of Session OT - End of Session Activity Tolerance: Patient tolerated treatment well Patient left: in bed;with call bell/phone within reach;with bed alarm set       Robet Leu, COTA/L 01/01/2013, 7:22 AM

## 2013-01-01 NOTE — Progress Notes (Signed)
Subjective:  No chest pain or dyspnea. No palpitations. Discouraged about her ileus and NG tube.  Objective:  Vital Signs in the last 24 hours: Temp:  [98.5 F (36.9 C)-98.7 F (37.1 C)] 98.5 F (36.9 C) (10/27 0510) Pulse Rate:  [71-79] 71 (10/27 0510) Resp:  [18] 18 (10/27 0510) BP: (126-154)/(52-76) 142/68 mmHg (10/27 0510) SpO2:  [91 %-95 %] 91 % (10/27 0510)  Intake/Output from previous day: 10/26 0701 - 10/27 0700 In: 2024.8 [I.V.:1263.3; IV Piggyback:250; TPN:511.5] Out: 2770 [Urine:850; Emesis/NG output:1900; Drains:20] Intake/Output from this shift:    . aztreonam  1 g Intravenous Q8H  . digoxin  0.125 mg Intravenous Daily  . enoxaparin (LOVENOX) injection  40 mg Subcutaneous Q24H  . famotidine (PEPCID) IV  20 mg Intravenous Q12H  . feeding supplement (RESOURCE BREEZE)  1 Container Oral TID BM  . insulin aspart  0-9 Units Subcutaneous Q8H  . levothyroxine  50 mcg Intravenous Daily  . magnesium sulfate 1 - 4 g bolus IVPB  2 g Intravenous Once  . metoprolol  5 mg Intravenous Q6H  . potassium chloride  10 mEq Intravenous Q1 Hr x 4  . potassium phosphate IVPB (mmol)  15 mmol Intravenous Once  . vancomycin  500 mg Intravenous Q12H   . 0.9 % NaCl with KCl 20 mEq / L 25 mL/hr at 12/31/12 2252  . 0.9 % NaCl with KCl 20 mEq / L    . Marland KitchenTPN (CLINIMIX-E) Adult 40 mL/hr at 12/31/12 2046   And  . fat emulsion 250 mL (12/31/12 2047)  . Marland KitchenTPN (CLINIMIX-E) Adult     And  . fat emulsion      Physical Exam: The patient appears to be in no distress.  Head and neck exam reveals that the pupils are equal and reactive.  The extraocular movements are full.  There is no scleral icterus.  Mouth and pharynx are benign.  No lymphadenopathy.  No carotid bruits.  The jugular venous pressure is normal.  Thyroid is not enlarged or tender.  Chest is clear to percussion and auscultation.  No rales or rhonchi.  Expansion of the chest is symmetrical.  Heart reveals no abnormal lift or  heave.  First and second heart sounds are normal.  There is no murmur gallop rub or click.  The abdomen is soft and nontender.  Bowel sounds are soft.  There is no hepatosplenomegaly or mass.  There are no abdominal bruits.  Extremities reveal no phlebitis or edema.  Pedal pulses are good.  There is no cyanosis or clubbing.  Neurologic exam is normal strength and no lateralizing weakness.  No sensory deficits.  Integument reveals no rash  Lab Results:  Recent Labs  12/31/12 0500 01/01/13 0614  WBC 23.6* 24.2*  HGB 14.9 12.5  PLT 173 148*    Recent Labs  12/31/12 0500 01/01/13 0614  NA 140 139  K 3.8 3.4*  CL 108 108  CO2 22 22  GLUCOSE 120* 143*  BUN 23 17  CREATININE 1.00 0.65   No results found for this basename: TROPONINI, CK, MB,  in the last 72 hours Hepatic Function Panel  Recent Labs  01/01/13 0614  PROT 5.2*  ALBUMIN 2.3*  AST 16  ALT 17  ALKPHOS 74  BILITOT 0.3   No results found for this basename: CHOL,  in the last 72 hours No results found for this basename: PROTIME,  in the last 72 hours  Imaging: Dg Chest 2 View  12/31/2012   CLINICAL DATA:  Increased white blood count  EXAM: CHEST  2 VIEW  COMPARISON:  09/19/2012, 12/22/2012  FINDINGS: No change in cardiac pacer. NG tube is identified with tip over the stomach. Heart size and vascular pattern are normal. There is patchy infiltrate in the right middle lobe, right lower lobe, and left lower lobe.  IMPRESSION: Patchy bilateral infiltrates concerning for multifocal pneumonia.   Electronically Signed   By: Esperanza Heir M.D.   On: 12/31/2012 09:40   Dg Chest Port 1 View  12/31/2012   CLINICAL DATA:  PICC line placement  EXAM: PORTABLE CHEST - 1 VIEW  COMPARISON:  12/31/2012  FINDINGS: Right-sided PICC line tip: SVC. No pneumothorax.  Nasogastric tube tip: Stomach fundus.  Interstitial and patchy airspace opacities similar to prior exam. Heart size within normal limits.  IMPRESSION: 1. Right-sided  PICC line tip: SVC. Otherwise stable.   Electronically Signed   By: Herbie Baltimore M.D.   On: 12/31/2012 20:28   Dg Abd 2 Views  12/31/2012   CLINICAL DATA:  Abdominal distension  EXAM: ABDOMEN - 2 VIEW  COMPARISON:  12/30/2012  FINDINGS: NG tube in place noted. Distended small bowel loops in mid abdomen with multiple air-fluid levels highly suspicious for small bowel obstruction. Again noted skin staples in right lower quadrant. No free abdominal air. Dual lead cardiac pacemaker leads partially visualized.  IMPRESSION: Distended small bowel loops in mid abdomen with multiple air-fluid levels highly suspicious for small bowel obstruction. NG tube in place.   Electronically Signed   By: Natasha Mead M.D.   On: 12/31/2012 09:39   Dg Abd 2 Views  12/30/2012   CLINICAL DATA:  Hernia repair and small bowel resections on 12/2012, with vomiting currently  EXAM: ABDOMEN - 2 VIEW  COMPARISON:  12/28/2012, 12/1912  FINDINGS: In the mid abdomen, there are numerous loops of prominent small bowel measuring up to about 4.2 cm. There is stool in the colon but there is minimal gas into the colon. There are scattered air-fluid levels.  IMPRESSION: Findings are concerning for mid small bowel obstruction.   Electronically Signed   By: Esperanza Heir M.D.   On: 12/30/2012 10:04    Cardiac Studies: Telemetry shows biventricular paced rhythm . Assessment/Plan:  1. Nonischemic cardiomyopathy. Appears euvolemic now. 2. NSVT improved since pacemaker adjusted. 3. Post-op ileus. 4. Abnormal chest xray ? Multifocal pneumonia.  Plan: continue IV lanoxin and metoprolol.   LOS: 10 days    Cassell Clement 01/01/2013, 8:07 AM

## 2013-01-01 NOTE — Progress Notes (Signed)
Patient ID: [name], female   DOB: 1932/06/05, 79 y.o.   MRN: [id number] 7 Days Post-Op  Subjective: Pt is appropriately frustrated.  No complaints.  No increase in abdominal pain.  Objective: Vital signs in last 24 hours: Temp:  [98.5 F (36.9 C)-98.7 F (37.1 C)] 98.5 F (36.9 C) (10/27 0510) Pulse Rate:  [71-79] 71 (10/27 0510) Resp:  [18] 18 (10/27 0510) BP: (126-154)/(52-76) 142/68 mmHg (10/27 0510) SpO2:  [91 %-95 %] 91 % (10/27 0510) Last BM Date: 12/28/12  Intake/Output from previous day: 10/26 0701 - 10/27 0700 In: 2024.8 [I.V.:1263.3; IV Piggyback:250; TPN:511.5] Out: 2770 [Urine:850; Emesis/NG output:1900; Drains:20] Intake/Output this shift:    PE: Abd: soft, tender over incision, otherwise benign, hyperactive BS, ND, NGT with bilious output. Right inguinal incision is c/d/i with JP with minimal serous output Heart: regular  Lab Results:   Recent Labs  12/31/12 0500 01/01/13 0614  WBC 23.6* 24.2*  HGB 14.9 12.5  HCT 43.0 36.9  PLT 173 148*   BMET  Recent Labs  12/31/12 0500 01/01/13 0614  NA 140 139  K 3.8 3.4*  CL 108 108  CO2 22 22  GLUCOSE 120* 143*  BUN 23 17  CREATININE 1.00 0.65  CALCIUM 8.9 8.5   PT/INR No results found for this basename: LABPROT, INR,  in the last 72 hours CMP     Component Value Date/Time   NA 139 01/01/2013 0614   NA 136 12/07/2012 1422   K 3.4* 01/01/2013 0614   CL 108 01/01/2013 0614   CO2 22 01/01/2013 0614   GLUCOSE 143* 01/01/2013 0614   GLUCOSE 90 12/07/2012 1422   BUN 17 01/01/2013 0614   BUN 17 12/07/2012 1422   CREATININE 0.65 01/01/2013 0614   CALCIUM 8.5 01/01/2013 0614   PROT 5.2* 01/01/2013 0614   PROT 5.8* 12/07/2012 1422   ALBUMIN 2.3* 01/01/2013 0614   AST 16 01/01/2013 0614   ALT 17 01/01/2013 0614   ALKPHOS 74 01/01/2013 0614   BILITOT 0.3 01/01/2013 0614   GFRNONAA 83* 01/01/2013 0614   GFRAA >90 01/01/2013 0614   Lipase     Component Value Date/Time   LIPASE 15 12/20/2012  2320       Studies/Results: Dg Chest 2 View  12/31/2012   CLINICAL DATA:  Increased white blood count  EXAM: CHEST  2 VIEW  COMPARISON:  09/19/2012, 12/22/2012  FINDINGS: No change in cardiac pacer. NG tube is identified with tip over the stomach. Heart size and vascular pattern are normal. There is patchy infiltrate in the right middle lobe, right lower lobe, and left lower lobe.  IMPRESSION: Patchy bilateral infiltrates concerning for multifocal pneumonia.   Electronically Signed   By: Esperanza Heir M.D.   On: 12/31/2012 09:40   Dg Chest Port 1 View  12/31/2012   CLINICAL DATA:  PICC line placement  EXAM: PORTABLE CHEST - 1 VIEW  COMPARISON:  12/31/2012  FINDINGS: Right-sided PICC line tip: SVC. No pneumothorax.  Nasogastric tube tip: Stomach fundus.  Interstitial and patchy airspace opacities similar to prior exam. Heart size within normal limits.  IMPRESSION: 1. Right-sided PICC line tip: SVC. Otherwise stable.   Electronically Signed   By: Herbie Baltimore M.D.   On: 12/31/2012 20:28   Dg Abd 2 Views  12/31/2012   CLINICAL DATA:  Abdominal distension  EXAM: ABDOMEN - 2 VIEW  COMPARISON:  12/30/2012  FINDINGS: NG tube in place noted. Distended small bowel loops in mid abdomen with multiple  air-fluid levels highly suspicious for small bowel obstruction. Again noted skin staples in right lower quadrant. No free abdominal air. Dual lead cardiac pacemaker leads partially visualized.  IMPRESSION: Distended small bowel loops in mid abdomen with multiple air-fluid levels highly suspicious for small bowel obstruction. NG tube in place.   Electronically Signed   By: Natasha Mead M.D.   On: 12/31/2012 09:39   Dg Abd 2 Views  12/30/2012   CLINICAL DATA:  Hernia repair and small bowel resections on 12/2012, with vomiting currently  EXAM: ABDOMEN - 2 VIEW  COMPARISON:  12/28/2012, 12/1912  FINDINGS: In the mid abdomen, there are numerous loops of prominent small bowel measuring up to about 4.2 cm. There  is stool in the colon but there is minimal gas into the colon. There are scattered air-fluid levels.  IMPRESSION: Findings are concerning for mid small bowel obstruction.   Electronically Signed   By: Esperanza Heir M.D.   On: 12/30/2012 10:04    Anti-infectives: Anti-infectives   Start     Dose/Rate Route Frequency Ordered Stop   01/01/13 0000  vancomycin (VANCOCIN) 500 mg in sodium chloride 0.9 % 100 mL IVPB     500 mg 100 mL/hr over 60 Minutes Intravenous Every 12 hours 12/31/12 1133 01/08/13 1159   12/31/12 1200  aztreonam (AZACTAM) 1 g in dextrose 5 % 50 mL IVPB     1 g 100 mL/hr over 30 Minutes Intravenous Every 8 hours 12/31/12 1120 01/08/13 1159   12/31/12 1200  vancomycin (VANCOCIN) IVPB 1000 mg/200 mL premix     1,000 mg 200 mL/hr over 60 Minutes Intravenous  Once 12/31/12 1133 12/31/12 1413   12/26/12 0800  ertapenem (INVANZ) 1 g in sodium chloride 0.9 % 50 mL IVPB     1 g 100 mL/hr over 30 Minutes Intravenous Every 24 hours 12/25/12 1529 12/26/12 0820   12/25/12 1100  [MAR Hold]  ertapenem (INVANZ) 1 g in sodium chloride 0.9 % 50 mL IVPB     (On MAR Hold since 12/25/12 1112)   1 g 100 mL/hr over 30 Minutes Intravenous On call to O.R. 12/25/12 1045 12/25/12 1145   12/25/12 0800  cefOXitin (MEFOXIN) 2 g in dextrose 5 % 50 mL IVPB  Status:  Discontinued     2 g 100 mL/hr over 30 Minutes Intravenous On call to O.R. 12/25/12 0750 12/25/12 1544       Assessment/Plan  1. S/p right femoral hernia repair with SBR for ischemic bowel 2. Leukocytosis 3. Bilateral pulmonary infiltrates, c/w PNA 4. Hypokalemia  Plan: 1. Replace K, likely lost from NGT output 2. Appreciate medicine assistance with PNA 3. Will check CT abdomen and pelvis with increasing WBC to rule out post op complication as cause for infection. 4. Recheck labs in am   LOS: 10 days    OSBORNE,KELLY E 01/01/2013, 8:00 AM Pager: [phone] Remains AF and denies abdominal pain.  NG still with dark output.  WBC  still elevated.  Will check CT abdomen but unlikely to have leak.

## 2013-01-01 NOTE — Progress Notes (Signed)
PT Cancellation Note  Patient Details Name: [name] MRN: [id number] DOB: 1932/08/31   Cancelled Treatment:    Reason Eval/Treat Not Completed: Medical issues which prohibited therapy (pt  appears distressed over situation, NGT, etc.. RN aware.) encouraged pt to ambulate but declined. Tearful.   Rada Hay 01/01/2013, 12:56 PM Blanchard Kelch PT [phone]

## 2013-01-02 ENCOUNTER — Telehealth: Payer: Self-pay | Admitting: *Deleted

## 2013-01-02 DIAGNOSIS — F411 Generalized anxiety disorder: Secondary | ICD-10-CM

## 2013-01-02 DIAGNOSIS — Z95810 Presence of automatic (implantable) cardiac defibrillator: Secondary | ICD-10-CM

## 2013-01-02 LAB — BASIC METABOLIC PANEL
CO2: 23 mEq/L (ref 19–32)
Calcium: 8.1 mg/dL — ABNORMAL LOW (ref 8.4–10.5)
Chloride: 100 mEq/L (ref 96–112)
GFR calc Af Amer: >90 mL/min (ref >90)
Glucose, Bld: 117 mg/dL — ABNORMAL HIGH (ref 70–99)
Sodium: 132 mEq/L — ABNORMAL LOW (ref 135–145)

## 2013-01-02 LAB — CBC
HCT: 36.4 % (ref 36.0–46.0)
Hemoglobin: 12.6 g/dL (ref 12.0–15.0)
MCH: 30.1 pg (ref 26.0–34.0)
MCHC: 34.6 g/dL (ref 30.0–36.0)
RBC: 4.19 MIL/uL (ref 3.87–5.11)
WBC: 21.1 10*3/uL — ABNORMAL HIGH (ref 4.0–10.5)

## 2013-01-02 LAB — GLUCOSE, CAPILLARY
Glucose-Capillary: 117 mg/dL — ABNORMAL HIGH (ref 70–99)
Glucose-Capillary: 120 mg/dL — ABNORMAL HIGH (ref 70–99)

## 2013-01-02 LAB — PHOSPHORUS: Phosphorus: 2.9 mg/dL (ref 2.3–4.6)

## 2013-01-02 LAB — VANCOMYCIN, TROUGH: Vancomycin Tr: 5.1 ug/mL — ABNORMAL LOW (ref 10.0–20.0)

## 2013-01-02 MED ORDER — TRACE MINERALS CR-CU-F-FE-I-MN-MO-SE-ZN IV SOLN
INTRAVENOUS | Status: AC
  Administered 2013-01-02: 18:00:00 via INTRAVENOUS
  Filled 2013-01-02: qty 2000

## 2013-01-02 MED ORDER — VANCOMYCIN HCL IN DEXTROSE 1-5 GM/200ML-% IV SOLN
1000.0000 mg | Freq: Two times a day (BID) | INTRAVENOUS | Status: AC
  Administered 2013-01-02 – 2013-01-07 (×11): 1000 mg via INTRAVENOUS
  Filled 2013-01-02 (×13): qty 200

## 2013-01-02 MED ORDER — FAT EMULSION 20 % IV EMUL
250.0000 mL | INTRAVENOUS | Status: AC
  Administered 2013-01-02: 250 mL via INTRAVENOUS
  Filled 2013-01-02: qty 250

## 2013-01-02 NOTE — Progress Notes (Signed)
[facility] PROGRESS NOTE CONSULT  AMIYLAH ANASTOS WUJ:[id number] DOB: 12-28-1932 DOA: 12/22/2012 PCP: Kimber Relic, MD  Assessment/Plan:  Hospital-acquired pneumonia  -Continue vancomycin and aztreonam.  -Blood and sputum cultures pending. -Strep pneumonia legionella urine antigens pending.  Leukocytosis  -Related to pneumonia.  -Follow trend with antibiotics. -CT abdomen without evidence for surgical complications.  Protein caloric malnutrition  -NG is out. -May need TNA if enteric route not started soon.   Wide-Complex Tachycardia -As per cardiology. -No further recurrence.  Femoral hernia  -As per surgery.   Code Status: Full Code Family Communication: Patient only  Disposition Plan: To be determined   Antibiotics:  Vancomycin 12/31/2012-->  Aztreonam 12/31/2012-->   Subjective: Frustrated with slowly resolving ileus. No real complaints. Pulled NG overnight.  Objective: Filed Vitals:   01/01/13 2110 01/02/13 0106 01/02/13 0654 01/02/13 0700  BP: 138/68 164/60 162/97 162/97  Pulse: 77 71 74 74  Temp: 97.9 F (36.6 C) 98.4 F (36.9 C)  98 F (36.7 C)  TempSrc: Oral Oral  Oral  Resp: 18 18  20   Height:      Weight:      SpO2: 100% 95%  91%    Intake/Output Summary (Last 24 hours) at 01/02/13 1053 Last data filed at 01/02/13 0700  Gross per 24 hour  Intake    690 ml  Output   1050 ml  Net   -360 ml   Filed Weights   12/22/12 1632 12/26/12 1656  Weight: 63.9 kg (140 lb 14 oz) 62.8 kg (138 lb 7.2 oz)    Exam:   General:  AA Ox3  Cardiovascular: RRR  Respiratory: bilateral ronchi  Abdomen: S/NT/ND/hypoactive BS  Extremities: no C/C/E   Neurologic:  Grossly intact and non-focal  Data Reviewed: Basic Metabolic Panel:  Recent Labs Lab 12/28/12 0545 12/29/12 0513 12/31/12 0500 01/01/13 0614 01/02/13 0455  NA 139 142 140 139 132*  K 3.9 4.1 3.8 3.4* 3.6  CL 106 107 108 108 100  CO2 19 19 22 22 23   GLUCOSE 80 66* 120*  143* 117*  BUN 13 13 23 17 14   CREATININE 0.61 0.59 1.00 0.65 0.57  CALCIUM 8.6 8.9 8.9 8.5 8.1*  MG  --   --  1.7 1.6 1.8  PHOS  --   --  3.7 2.0* 2.9   Liver Function Tests:  Recent Labs Lab 12/31/12 0500 01/01/13 0614  AST 17 16  ALT 21 17  ALKPHOS 69 74  BILITOT 0.5 0.3  PROT 5.9* 5.2*  ALBUMIN 2.8* 2.3*   No results found for this basename: LIPASE, AMYLASE,  in the last 168 hours No results found for this basename: AMMONIA,  in the last 168 hours CBC:  Recent Labs Lab 12/27/12 0655 12/28/12 0545 12/31/12 0500 01/01/13 0614 01/02/13 0455  WBC 6.2 7.5 23.6* 24.2* 21.1*  NEUTROABS  --   --  20.5* 21.0*  --   HGB 15.1* 13.5 14.9 12.5 12.6  HCT 43.2 39.3 43.0 36.9 36.4  MCV 88.2 88.1 88.1 88.7 86.9  PLT 223 223 173 148* 165   Cardiac Enzymes: No results found for this basename: CKTOTAL, CKMB, CKMBINDEX, TROPONINI,  in the last 168 hours BNP (last 3 results) No results found for this basename: PROBNP,  in the last 8760 hours CBG:  Recent Labs Lab 12/31/12 2129 01/01/13 0818 01/01/13 1635 01/02/13 0004 01/02/13 0801  GLUCAP 85 122* 148* 120* 117*    Recent Results (from the past 240 hour(s))  CULTURE,  BLOOD (ROUTINE X 2)     Status: None   Collection Time    12/31/12 12:00 PM      Result Value Range Status   Specimen Description BLOOD RIGHT ARM   Final   Special Requests BOTTLES DRAWN AEROBIC AND ANAEROBIC 2 CC EA   Final   Culture  Setup Time     Final   Value: 12/31/2012 17:46     Performed at [facility]   Culture     Final   Value:        BLOOD CULTURE RECEIVED NO GROWTH TO DATE CULTURE WILL BE HELD FOR 5 DAYS BEFORE ISSUING A FINAL NEGATIVE REPORT     Performed at [facility]   Report Status PENDING   Incomplete  CULTURE, BLOOD (ROUTINE X 2)     Status: None   Collection Time    12/31/12 12:15 PM      Result Value Range Status   Specimen Description BLOOD LEFT HAND   Final   Special Requests BOTTLES DRAWN AEROBIC AND  ANAEROBIC 2 CC EA   Final   Culture  Setup Time     Final   Value: 12/31/2012 17:46     Performed at [facility]   Culture     Final   Value:        BLOOD CULTURE RECEIVED NO GROWTH TO DATE CULTURE WILL BE HELD FOR 5 DAYS BEFORE ISSUING A FINAL NEGATIVE REPORT     Performed at [facility]   Report Status PENDING   Incomplete  CULTURE, EXPECTORATED SPUTUM-ASSESSMENT     Status: None   Collection Time    01/01/13  5:52 AM      Result Value Range Status   Specimen Description SPUTUM   Final   Special Requests NONE   Final   Sputum evaluation     Final   Value: THIS SPECIMEN IS ACCEPTABLE. RESPIRATORY CULTURE REPORT TO FOLLOW.   Report Status 01/01/2013 FINAL   Final  CULTURE, RESPIRATORY (NON-EXPECTORATED)     Status: None   Collection Time    01/01/13  5:52 AM      Result Value Range Status   Specimen Description SPUTUM   Final   Special Requests NONE   Final   Gram Stain     Final   Value: ABUNDANT WBC PRESENT, PREDOMINANTLY PMN     RARE SQUAMOUS EPITHELIAL CELLS PRESENT     RARE GRAM POSITIVE COCCI     IN PAIRS RARE GRAM NEGATIVE RODS     Performed at [facility]   Culture     Final   Value: NORMAL OROPHARYNGEAL FLORA     Performed at [facility]   Report Status PENDING   Incomplete     Studies: Ct Abdomen Pelvis W Contrast  01/01/2013   CLINICAL DATA:  Leukocytosis. Recent incarcerated femoral hernia repair.  EXAM: CT ABDOMEN AND PELVIS WITH CONTRAST  TECHNIQUE: Multidetector CT imaging of the abdomen and pelvis was performed using the standard protocol following bolus administration of intravenous contrast.  CONTRAST:  80mL OMNIPAQUE IOHEXOL 300 MG/ML  SOLN  COMPARISON:  12/24/2012 and 12/11/2012.  FINDINGS: Lung bases show patchy airspace consolidation and ground-glass involving the right middle lobe and both lower lobes. Tiny bilateral pleural effusions. Heart size normal. No pericardial effusion.  Mild intrahepatic biliary ductal  dilatation appears unchanged. Small. Hepatic ascites. Gallbladder is decompressed. Adrenal glands are unremarkable. Sub cm low-attenuation lesions in the  kidneys are again noted and unchanged. Spleen is unremarkable. Difficult to exclude a 9 mm splenic artery aneurysm in the splenic hilum. Favor fat invagination in the pancreatic neck rather than a true lesion (image 34). Nasogastric tube terminates in the stomach. Mild dilatation of fluid-filled small bowel loops without a discrete transition point. Anastomotic sutures are seen in the distal small bowel. Distal small bowel loops do appear decompressed. A fair amount of stool is seen in the colon.  Small ascites. Atherosclerotic calcification of the arterial vasculature without abdominal aortic aneurysm. A soft tissue drain is seen in the right inguinal region. Postoperative changes of right inguinal hernia repair with small reactive inguinal lymph nodes. Degenerative changes are seen in the spine. No worrisome lytic or sclerotic lesions.  IMPRESSION: 1. Postoperative ileus versus early or partial small bowel obstruction. 2. Postoperative changes of right femoral hernia repair and small bowel resection. 3. Patchy right middle, right lower and left lower lobe airspace disease, indicative of pneumonia. 4. Tiny bilateral effusions. 5. Small ascites. 6. Possible small splenic artery aneurysm.   Electronically Signed   By: Leanna Battles M.D.   On: 01/01/2013 13:54   Dg Chest Port 1 View  12/31/2012   CLINICAL DATA:  PICC line placement  EXAM: PORTABLE CHEST - 1 VIEW  COMPARISON:  12/31/2012  FINDINGS: Right-sided PICC line tip: SVC. No pneumothorax.  Nasogastric tube tip: Stomach fundus.  Interstitial and patchy airspace opacities similar to prior exam. Heart size within normal limits.  IMPRESSION: 1. Right-sided PICC line tip: SVC. Otherwise stable.   Electronically Signed   By: Herbie Baltimore M.D.   On: 12/31/2012 20:28    Scheduled Meds: . aztreonam  1 g  Intravenous Q8H  . digoxin  0.125 mg Intravenous Daily  . enoxaparin (LOVENOX) injection  40 mg Subcutaneous Q24H  . famotidine (PEPCID) IV  20 mg Intravenous Q12H  . feeding supplement (RESOURCE BREEZE)  1 Container Oral TID BM  . insulin aspart  0-9 Units Subcutaneous Q8H  . levothyroxine  50 mcg Intravenous Daily  . metoprolol  5 mg Intravenous Q6H  . sodium chloride  10-40 mL Intracatheter Q12H  . vancomycin  500 mg Intravenous Q12H   Continuous Infusions: . 0.9 % NaCl with KCl 20 mEq / L 20 mL/hr at 01/01/13 1737  . Marland KitchenTPN (CLINIMIX-E) Adult 60 mL/hr at 01/01/13 1712   And  . fat emulsion 250 mL (01/01/13 1712)  . Marland KitchenTPN (CLINIMIX-E) Adult     And  . fat emulsion      Principal Problem:   Femoral hernia with gangrene and obstruction Active Problems:   Unspecified intestinal obstruction   Malnutrition of moderate degree   Moderate malnutrition   HCAP (healthcare-associated pneumonia)   Leukocytosis, unspecified    Time spent: 35 minutes    HERNANDEZ ACOSTA,ESTELA  [facility] Pager [phone]  If 7PM-7AM, please contact night-coverage at [email or web address], password [email or web address] 01/02/2013, 10:53 AM  LOS: 11 days

## 2013-01-02 NOTE — Telephone Encounter (Signed)
Called patient and discussed current issues with patient.

## 2013-01-02 NOTE — Progress Notes (Signed)
Subjective:  No chest pain or dyspnea. No palpitations. Feels better with NG tube out. Had small BM last night.  Objective:  Vital Signs in the last 24 hours: Temp:  [97.9 F (36.6 C)-98.4 F (36.9 C)] 98 F (36.7 C) (10/28 0700) Pulse Rate:  [66-77] 74 (10/28 0700) Resp:  [18-20] 20 (10/28 0700) BP: (137-164)/(60-97) 162/97 mmHg (10/28 0700) SpO2:  [91 %-100 %] 91 % (10/28 0700)  Intake/Output from previous day: 10/27 0701 - 10/28 0700 In: 400 [IV Piggyback:400] Out: 1050 [Urine:550; Emesis/NG output:500] Intake/Output from this shift:    . aztreonam  1 g Intravenous Q8H  . digoxin  0.125 mg Intravenous Daily  . enoxaparin (LOVENOX) injection  40 mg Subcutaneous Q24H  . famotidine (PEPCID) IV  20 mg Intravenous Q12H  . feeding supplement (RESOURCE BREEZE)  1 Container Oral TID BM  . insulin aspart  0-9 Units Subcutaneous Q8H  . levothyroxine  50 mcg Intravenous Daily  . metoprolol  5 mg Intravenous Q6H  . sodium chloride  10-40 mL Intracatheter Q12H  . vancomycin  500 mg Intravenous Q12H   . 0.9 % NaCl with KCl 20 mEq / L 20 mL/hr at 01/01/13 1737  . Marland KitchenTPN (CLINIMIX-E) Adult 60 mL/hr at 01/01/13 1712   And  . fat emulsion 250 mL (01/01/13 1712)    Physical Exam: The patient appears to be in no distress.  Head and neck exam reveals that the pupils are equal and reactive.  The extraocular movements are full.  There is no scleral icterus.  Mouth and pharynx are benign.  No lymphadenopathy.  No carotid bruits.  The jugular venous pressure is normal.  Thyroid is not enlarged or tender.  Chest is clear to percussion and auscultation.  No rales or rhonchi.  Expansion of the chest is symmetrical.  Heart reveals no abnormal lift or heave.  First and second heart sounds are normal.  There is no murmur gallop rub or click.  The abdomen is soft and nontender.  Bowel sounds are soft.  There is no hepatosplenomegaly or mass.  There are no abdominal bruits.  Extremities  reveal no phlebitis or edema.  Pedal pulses are good.  There is no cyanosis or clubbing.  Neurologic exam is normal strength and no lateralizing weakness.  No sensory deficits.  Integument reveals no rash  Lab Results:  Recent Labs  01/01/13 0614 01/02/13 0455  WBC 24.2* 21.1*  HGB 12.5 12.6  PLT 148* 165    Recent Labs  12/31/12 0500 01/01/13 0614  NA 140 139  K 3.8 3.4*  CL 108 108  CO2 22 22  GLUCOSE 120* 143*  BUN 23 17  CREATININE 1.00 0.65   No results found for this basename: TROPONINI, CK, MB,  in the last 72 hours Hepatic Function Panel  Recent Labs  01/01/13 0614  PROT 5.2*  ALBUMIN 2.3*  AST 16  ALT 17  ALKPHOS 74  BILITOT 0.3   No results found for this basename: CHOL,  in the last 72 hours No results found for this basename: PROTIME,  in the last 72 hours  Imaging: Dg Chest 2 View  12/31/2012   CLINICAL DATA:  Increased white blood count  EXAM: CHEST  2 VIEW  COMPARISON:  09/19/2012, 12/22/2012  FINDINGS: No change in cardiac pacer. NG tube is identified with tip over the stomach. Heart size and vascular pattern are normal. There is patchy infiltrate in the right middle lobe, right lower lobe, and  left lower lobe.  IMPRESSION: Patchy bilateral infiltrates concerning for multifocal pneumonia.   Electronically Signed   By: Esperanza Heir M.D.   On: 12/31/2012 09:40   Ct Abdomen Pelvis W Contrast  01/01/2013   CLINICAL DATA:  Leukocytosis. Recent incarcerated femoral hernia repair.  EXAM: CT ABDOMEN AND PELVIS WITH CONTRAST  TECHNIQUE: Multidetector CT imaging of the abdomen and pelvis was performed using the standard protocol following bolus administration of intravenous contrast.  CONTRAST:  80mL OMNIPAQUE IOHEXOL 300 MG/ML  SOLN  COMPARISON:  12/24/2012 and 12/11/2012.  FINDINGS: Lung bases show patchy airspace consolidation and ground-glass involving the right middle lobe and both lower lobes. Tiny bilateral pleural effusions. Heart size normal. No  pericardial effusion.  Mild intrahepatic biliary ductal dilatation appears unchanged. Small. Hepatic ascites. Gallbladder is decompressed. Adrenal glands are unremarkable. Sub cm low-attenuation lesions in the kidneys are again noted and unchanged. Spleen is unremarkable. Difficult to exclude a 9 mm splenic artery aneurysm in the splenic hilum. Favor fat invagination in the pancreatic neck rather than a true lesion (image 34). Nasogastric tube terminates in the stomach. Mild dilatation of fluid-filled small bowel loops without a discrete transition point. Anastomotic sutures are seen in the distal small bowel. Distal small bowel loops do appear decompressed. A fair amount of stool is seen in the colon.  Small ascites. Atherosclerotic calcification of the arterial vasculature without abdominal aortic aneurysm. A soft tissue drain is seen in the right inguinal region. Postoperative changes of right inguinal hernia repair with small reactive inguinal lymph nodes. Degenerative changes are seen in the spine. No worrisome lytic or sclerotic lesions.  IMPRESSION: 1. Postoperative ileus versus early or partial small bowel obstruction. 2. Postoperative changes of right femoral hernia repair and small bowel resection. 3. Patchy right middle, right lower and left lower lobe airspace disease, indicative of pneumonia. 4. Tiny bilateral effusions. 5. Small ascites. 6. Possible small splenic artery aneurysm.   Electronically Signed   By: Leanna Battles M.D.   On: 01/01/2013 13:54   Dg Chest Port 1 View  12/31/2012   CLINICAL DATA:  PICC line placement  EXAM: PORTABLE CHEST - 1 VIEW  COMPARISON:  12/31/2012  FINDINGS: Right-sided PICC line tip: SVC. No pneumothorax.  Nasogastric tube tip: Stomach fundus.  Interstitial and patchy airspace opacities similar to prior exam. Heart size within normal limits.  IMPRESSION: 1. Right-sided PICC line tip: SVC. Otherwise stable.   Electronically Signed   By: Herbie Baltimore M.D.   On:  12/31/2012 20:28   Dg Abd 2 Views  12/31/2012   CLINICAL DATA:  Abdominal distension  EXAM: ABDOMEN - 2 VIEW  COMPARISON:  12/30/2012  FINDINGS: NG tube in place noted. Distended small bowel loops in mid abdomen with multiple air-fluid levels highly suspicious for small bowel obstruction. Again noted skin staples in right lower quadrant. No free abdominal air. Dual lead cardiac pacemaker leads partially visualized.  IMPRESSION: Distended small bowel loops in mid abdomen with multiple air-fluid levels highly suspicious for small bowel obstruction. NG tube in place.   Electronically Signed   By: Natasha Mead M.D.   On: 12/31/2012 09:39    Cardiac Studies: Telemetry shows biventricular paced rhythm . Assessment/Plan:  1. Nonischemic cardiomyopathy. Appears euvolemic now. 2. NSVT improved since pacemaker adjusted. No further VT seen. 3. Post-op ileus. 4. Abnormal chest xray ? Multifocal pneumonia.  Plan: continue IV lanoxin and metoprolol. Switch to oral route when able.   LOS: 11 days    [name]  01/02/2013, 7:38 AM

## 2013-01-02 NOTE — Progress Notes (Signed)
Patient ID: [name], female   DOB: 05-20-1932, 79 y.o.   MRN: [id number] 8 Days Post-Op  Subjective: Pt feels so much better today without her NGT.  She pulled it out last night.  She has had 2 BMs since then and no nausea.   Objective: Vital signs in last 24 hours: Temp:  [97.9 F (36.6 C)-98.4 F (36.9 C)] 98 F (36.7 C) (10/28 0700) Pulse Rate:  [66-77] 74 (10/28 0700) Resp:  [18-20] 20 (10/28 0700) BP: (137-164)/(60-97) 162/97 mmHg (10/28 0700) SpO2:  [91 %-100 %] 91 % (10/28 0700) Last BM Date: 01/02/13  Intake/Output from previous day: 10/27 0701 - 10/28 0700 In: 690 [I.V.:240; IV Piggyback:450] Out: 1050 [Urine:550; Emesis/NG output:500] Intake/Output this shift:    PE: Abd: soft, NT, ND, incision c/d/i with staples.  JP with just serous output.  +BS  Lab Results:   Recent Labs  01/01/13 0614 01/02/13 0455  WBC 24.2* 21.1*  HGB 12.5 12.6  HCT 36.9 36.4  PLT 148* 165   BMET  Recent Labs  01/01/13 0614 01/02/13 0455  NA 139 132*  K 3.4* 3.6  CL 108 100  CO2 22 23  GLUCOSE 143* 117*  BUN 17 14  CREATININE 0.65 0.57  CALCIUM 8.5 8.1*   PT/INR No results found for this basename: LABPROT, INR,  in the last 72 hours CMP     Component Value Date/Time   NA 132* 01/02/2013 0455   NA 136 12/07/2012 1422   K 3.6 01/02/2013 0455   CL 100 01/02/2013 0455   CO2 23 01/02/2013 0455   GLUCOSE 117* 01/02/2013 0455   GLUCOSE 90 12/07/2012 1422   BUN 14 01/02/2013 0455   BUN 17 12/07/2012 1422   CREATININE 0.57 01/02/2013 0455   CALCIUM 8.1* 01/02/2013 0455   PROT 5.2* 01/01/2013 0614   PROT 5.8* 12/07/2012 1422   ALBUMIN 2.3* 01/01/2013 0614   AST 16 01/01/2013 0614   ALT 17 01/01/2013 0614   ALKPHOS 74 01/01/2013 0614   BILITOT 0.3 01/01/2013 0614   GFRNONAA 87* 01/02/2013 0455   GFRAA >90 01/02/2013 0455   Lipase     Component Value Date/Time   LIPASE 15 12/20/2012 2320       Studies/Results: Ct Abdomen Pelvis W Contrast  01/01/2013    CLINICAL DATA:  Leukocytosis. Recent incarcerated femoral hernia repair.  EXAM: CT ABDOMEN AND PELVIS WITH CONTRAST  TECHNIQUE: Multidetector CT imaging of the abdomen and pelvis was performed using the standard protocol following bolus administration of intravenous contrast.  CONTRAST:  80mL OMNIPAQUE IOHEXOL 300 MG/ML  SOLN  COMPARISON:  12/24/2012 and 12/11/2012.  FINDINGS: Lung bases show patchy airspace consolidation and ground-glass involving the right middle lobe and both lower lobes. Tiny bilateral pleural effusions. Heart size normal. No pericardial effusion.  Mild intrahepatic biliary ductal dilatation appears unchanged. Small. Hepatic ascites. Gallbladder is decompressed. Adrenal glands are unremarkable. Sub cm low-attenuation lesions in the kidneys are again noted and unchanged. Spleen is unremarkable. Difficult to exclude a 9 mm splenic artery aneurysm in the splenic hilum. Favor fat invagination in the pancreatic neck rather than a true lesion (image 34). Nasogastric tube terminates in the stomach. Mild dilatation of fluid-filled small bowel loops without a discrete transition point. Anastomotic sutures are seen in the distal small bowel. Distal small bowel loops do appear decompressed. A fair amount of stool is seen in the colon.  Small ascites. Atherosclerotic calcification of the arterial vasculature without abdominal aortic aneurysm. A soft tissue  drain is seen in the right inguinal region. Postoperative changes of right inguinal hernia repair with small reactive inguinal lymph nodes. Degenerative changes are seen in the spine. No worrisome lytic or sclerotic lesions.  IMPRESSION: 1. Postoperative ileus versus early or partial small bowel obstruction. 2. Postoperative changes of right femoral hernia repair and small bowel resection. 3. Patchy right middle, right lower and left lower lobe airspace disease, indicative of pneumonia. 4. Tiny bilateral effusions. 5. Small ascites. 6. Possible small  splenic artery aneurysm.   Electronically Signed   By: Leanna Battles M.D.   On: 01/01/2013 13:54   Dg Chest Port 1 View  12/31/2012   CLINICAL DATA:  PICC line placement  EXAM: PORTABLE CHEST - 1 VIEW  COMPARISON:  12/31/2012  FINDINGS: Right-sided PICC line tip: SVC. No pneumothorax.  Nasogastric tube tip: Stomach fundus.  Interstitial and patchy airspace opacities similar to prior exam. Heart size within normal limits.  IMPRESSION: 1. Right-sided PICC line tip: SVC. Otherwise stable.   Electronically Signed   By: Herbie Baltimore M.D.   On: 12/31/2012 20:28    Anti-infectives: Anti-infectives   Start     Dose/Rate Route Frequency Ordered Stop   01/01/13 0000  vancomycin (VANCOCIN) 500 mg in sodium chloride 0.9 % 100 mL IVPB     500 mg 100 mL/hr over 60 Minutes Intravenous Every 12 hours 12/31/12 1133 01/08/13 1159   12/31/12 1200  aztreonam (AZACTAM) 1 g in dextrose 5 % 50 mL IVPB     1 g 100 mL/hr over 30 Minutes Intravenous Every 8 hours 12/31/12 1120 01/08/13 1159   12/31/12 1200  vancomycin (VANCOCIN) IVPB 1000 mg/200 mL premix     1,000 mg 200 mL/hr over 60 Minutes Intravenous  Once 12/31/12 1133 12/31/12 1413   12/26/12 0800  ertapenem (INVANZ) 1 g in sodium chloride 0.9 % 50 mL IVPB     1 g 100 mL/hr over 30 Minutes Intravenous Every 24 hours 12/25/12 1529 12/26/12 0820   12/25/12 1100  [MAR Hold]  ertapenem (INVANZ) 1 g in sodium chloride 0.9 % 50 mL IVPB     (On MAR Hold since 12/25/12 1112)   1 g 100 mL/hr over 30 Minutes Intravenous On call to O.R. 12/25/12 1045 12/25/12 1145   12/25/12 0800  cefOXitin (MEFOXIN) 2 g in dextrose 5 % 50 mL IVPB  Status:  Discontinued     2 g 100 mL/hr over 30 Minutes Intravenous On call to O.R. 12/25/12 0750 12/25/12 1544       Assessment/Plan  1. Repair of incarcerated right inguinal hernia with SBR for ischemic bowel 2. Post op ileus, slowly improving 3. B PNA 4. Nonischemic cardiomyopathy  Plan: 1. Patient actually seems better  today.  She is passing flatus and stool.  Ok to keep NGT out, but will keep NPO today except for a few ice chips.  If she redevelops nausea or vomiting, she will need her NGT replaced, 2. Patient needs to mobilize. 3. Appreciate medicine assistance with PNA and cards for cardiac issues.  LOS: 11 days    OSBORNE,KELLY E 01/02/2013, 9:59 AM Pager: [phone] Feels better without NG tube.  Abdomen is nontender but has some tympany still.  Will leave NPO today and if no nausea or vomiting will try to advance diet tomorrow.

## 2013-01-02 NOTE — Progress Notes (Addendum)
ANTIBIOTIC CONSULT NOTE   Pharmacy Consult for Vancomycin, renal adjustment of antibiotics Indication: suspected PNA  Allergies  Allergen Reactions  . Hydrocodone Itching  . Cephalexin Itching and Swelling  . Doxycycline Other (See Comments)    Per pt: unknown  . Ofloxacin Other (See Comments)    Per pt: unknown  . Tape Other (See Comments)    Burns skin.   . Latex Rash    Patient Measurements: Height: 5\' 8"  (172.7 cm) Weight: 138 lb 7.2 oz (62.8 kg) IBW/kg (Calculated) : 63.9  Vital Signs: Temp: 98 F (36.7 C) (10/28 0700) Temp src: Oral (10/28 0700) BP: 162/97 mmHg (10/28 0700) Pulse Rate: 74 (10/28 0700) Intake/Output from previous day: 10/27 0701 - 10/28 0700 In: 690 [I.V.:240; IV Piggyback:450] Out: 1050 [Urine:550; Emesis/NG output:500] Intake/Output from this shift:    Labs:  Recent Labs  12/31/12 0500 01/01/13 0614 01/02/13 0455  WBC 23.6* 24.2* 21.1*  HGB 14.9 12.5 12.6  PLT 173 148* 165  CREATININE 1.00 0.65 0.57    Medical History: Past Medical History  Diagnosis Date  . Nonischemic cardiomyopathy     EF initially 20%; Last measurement up to 50% per echo in August of 2012  . CHF (congestive heart failure)   . LBBB (left bundle branch block)   . Mitral regurgitation   . Melanoma     right arm  . Hypothyroidism   . Erosive gastritis 08/2004  . Hyperplastic polyps of stomach 06/2002  . Barrett's esophagus 04/2008  . GERD (gastroesophageal reflux disease)   . Diverticulitis   . Anxiety disorder   . HTN (hypertension)   . ICD (implantable cardiac defibrillator) in place     BiV/ICD; s/p removal of ICD with insertion of BiV PPM 04/26/12  . Hair thinning   . Unspecified sinusitis (chronic)   . Unspecified chronic bronchitis   . Depressive disorder, not elsewhere classified   . Restless legs syndrome (RLS)   . Chronic airway obstruction, not elsewhere classified   . Benign neoplasm of colon   . Other and unspecified hyperlipidemia   .  Unspecified nasal polyp   . Pain in joint, lower leg   . Synovial cyst of popliteal space   . Complication of anesthesia     slow to wake up  . Pacemaker 04/26/2012  . Sleep apnea   . Other and unspecified hyperlipidemia   . Dysthymic disorder   . Palpitations     Assessment: 79 yo female s/p small bowel resection and tissue repair of right femoral hernia on 10/20. Plan to start TNA (awaiting PICC placement). On 10/26, pt with rising WBC and CXR with patchy bilateral infiltrates concerning for multifocal PNA. MD order to start Vancomycin per pharmacy along with Aztreonam.   10/20 >> Ertapenem >> 10/21 10/26 >> Vanc x8d >>  10/26 >> Aztreonam x 8d >>  Tmax: afeb WBCs: peak 24.2,today = 21.1 Renal: Scr = 0.57, CG 45ml/min, N 66 ml/min (using Scr = 0.8mg /dl)  16/10 Blood:NGTD 96/04 respiratory: pending Legionella Ag: negative S. pneumo Ag: to be drawn  Goal of Therapy:  Vancomycin trough level 15-20 mcg/ml  Plan:   Continue Vancomycin 500 mg IV q12h  Check trough today or tomorrow if continued  Continue aztreonam 1g IV q8h   Juliette Alcide, PharmD, BCPS.   Pager: [phone] 01/02/2013 8:42 AM    Addendum: Vancomycin trough:  10/28: VT 11:30am =  5.1 mcg/ml prior to 5th dose 500mg  q12h  Plan:  Vancomycin trough lower than predicted,  change to vancomycin 1gm IV q12h for est Peak = 35, trough = 14  Recheck steady state trough if remains on vancomycin  Juliette Alcide, PharmD, BCPS.   Pager: [phone] 01/02/2013 12:51 PM

## 2013-01-02 NOTE — Telephone Encounter (Signed)
[name] called and wanted you to give [name] a call. Patient is not doing too good and still in the hospital. Please call  #:  [phone]

## 2013-01-02 NOTE — Progress Notes (Signed)
Physical Therapy Treatment Patient Details Name: [name] MRN: [id number] DOB: [birth year] Today's Date: 01/02/2013 Time: 4098-1191 PT Time Calculation (min): 24 min  PT Assessment / Plan / Recommendation  History of Present Illness 79 yo female admitted 12/22/12. S/P explaoratory lap, SB resection, primary repair strangulated femoral hernia on 12/25/12.   PT Comments   Progressing slowly with mobility. Pt appears more fatigued with activity this session. However pt remains motivated to regain independence  Follow Up Recommendations  SNF;Supervision/Assistance - 24 hour     Does the patient have the potential to tolerate intense rehabilitation     Barriers to Discharge        Equipment Recommendations  None recommended by PT    Recommendations for Other Services    Frequency Min 3X/week   Progress towards PT Goals Progress towards PT goals: Progressing toward goals (slowly)  Plan Current plan remains appropriate    Precautions / Restrictions Precautions Precautions: Fall Precaution Comments: , R  abd drain. Restrictions Weight Bearing Restrictions: No   Pertinent Vitals/Pain Abdomen "sore"    Mobility  Bed Mobility Bed Mobility: Supine to Sit Supine to Sit: 6: Modified independent (Device/Increase time);HOB elevated Transfers Transfers: Sit to Stand;Stand to Sit Sit to Stand: 5: Supervision;From bed;From toilet Stand to Sit: 5: Supervision;To toilet;To chair/3-in-1 Ambulation/Gait Ambulation/Gait Assistance: 4: Min assist Ambulation Distance (Feet): 300 Feet Assistive device: 2 person hand held assist Ambulation/Gait Assistance Details: Attempted to get pt to use walker-pt politely declined. Preferred to hold onto therapist and IV pole. Unsteady. Fatigues easily.  Gait Pattern: Step-through pattern;Decreased stride length;Trunk flexed    Exercises     PT Diagnosis:    PT Problem List:   PT Treatment Interventions:     PT Goals (current goals can now be  found in the care plan section)    Visit Information  Last PT Received On: 01/02/13 Assistance Needed: +1 History of Present Illness: 79 yo female admitted 12/22/12. S/P explaoratory lap, SB resection, primary repair strangulated femoral hernia on 12/25/12.    Subjective Data      Cognition  Cognition Arousal/Alertness: Awake/alert Behavior During Therapy: WFL for tasks assessed/performed Overall Cognitive Status: Within Functional Limits for tasks assessed    Balance     End of Session PT - End of Session Activity Tolerance: Patient limited by fatigue Patient left: in chair;with call bell/phone within reach;with nursing/sitter in room Nurse Communication: Mobility status (spoke with NT about placing chair alarm in case pt gets up)   GP     Rebeca Alert, MPT Pager: [phone]

## 2013-01-02 NOTE — Progress Notes (Signed)
PARENTERAL NUTRITION CONSULT NOTE   Pharmacy Consult for TNA Indication: Ileus  Allergies  Allergen Reactions  . Hydrocodone Itching  . Cephalexin Itching and Swelling  . Doxycycline Other (See Comments)    Per pt: unknown  . Ofloxacin Other (See Comments)    Per pt: unknown  . Tape Other (See Comments)    Burns skin.   . Latex Rash    Patient Measurements: Height: 5\' 8"  (172.7 cm) Weight: 138 lb 7.2 oz (62.8 kg) IBW/kg (Calculated) : 63.9  Vital Signs: Temp: 98 F (36.7 C) (10/28 0700) Temp src: Oral (10/28 0700) BP: 162/97 mmHg (10/28 0700) Pulse Rate: 74 (10/28 0700) Intake/Output from previous day: 10/27 0701 - 10/28 0700 In: 690 [I.V.:240; IV Piggyback:450] Out: 1050 [Urine:550; Emesis/NG output:500] Intake/Output from this shift:    Labs:  Recent Labs  12/31/12 0500 01/01/13 0614 01/02/13 0455  WBC 23.6* 24.2* 21.1*  HGB 14.9 12.5 12.6  HCT 43.0 36.9 36.4  PLT 173 148* 165     Recent Labs  12/31/12 0500 01/01/13 0614 01/02/13 0455  NA 140 139 132*  K 3.8 3.4* 3.6  CL 108 108 100  CO2 22 22 23   GLUCOSE 120* 143* 117*  BUN 23 17 14   CREATININE 1.00 0.65 0.57  CALCIUM 8.9 8.5 8.1*  MG 1.7 1.6 1.8  PHOS 3.7 2.0* 2.9  PROT 5.9* 5.2*  --   ALBUMIN 2.8* 2.3*  --   AST 17 16  --   ALT 21 17  --   ALKPHOS 69 74  --   BILITOT 0.5 0.3  --   PREALBUMIN 7.6* 5.1*  --   TRIG 65 89  --    Estimated Creatinine Clearance: 57.5 ml/min (by C-G formula based on Cr of 0.57).    Recent Labs  01/01/13 1635 01/02/13 0004 01/02/13 0801  GLUCAP 148* 120* 117*    Medical History: Past Medical History  Diagnosis Date  . Nonischemic cardiomyopathy     EF initially 20%; Last measurement up to 50% per echo in August of 2012  . CHF (congestive heart failure)   . LBBB (left bundle branch block)   . Mitral regurgitation   . Melanoma     right arm  . Hypothyroidism   . Erosive gastritis 08/2004  . Hyperplastic polyps of stomach 06/2002  . Barrett's  esophagus 04/2008  . GERD (gastroesophageal reflux disease)   . Diverticulitis   . Anxiety disorder   . HTN (hypertension)   . ICD (implantable cardiac defibrillator) in place     BiV/ICD; s/p removal of ICD with insertion of BiV PPM 04/26/12  . Hair thinning   . Unspecified sinusitis (chronic)   . Unspecified chronic bronchitis   . Depressive disorder, not elsewhere classified   . Restless legs syndrome (RLS)   . Chronic airway obstruction, not elsewhere classified   . Benign neoplasm of colon   . Other and unspecified hyperlipidemia   . Unspecified nasal polyp   . Pain in joint, lower leg   . Synovial cyst of popliteal space   . Complication of anesthesia     slow to wake up  . Pacemaker 04/26/2012  . Sleep apnea   . Other and unspecified hyperlipidemia   . Dysthymic disorder   . Palpitations     Medications:  Scheduled:  . aztreonam  1 g Intravenous Q8H  . digoxin  0.125 mg Intravenous Daily  . enoxaparin (LOVENOX) injection  40 mg Subcutaneous Q24H  . famotidine (PEPCID)  IV  20 mg Intravenous Q12H  . feeding supplement (RESOURCE BREEZE)  1 Container Oral TID BM  . insulin aspart  0-9 Units Subcutaneous Q8H  . levothyroxine  50 mcg Intravenous Daily  . metoprolol  5 mg Intravenous Q6H  . sodium chloride  10-40 mL Intracatheter Q12H  . vancomycin  500 mg Intravenous Q12H   Infusions:  . 0.9 % NaCl with KCl 20 mEq / L 20 mL/hr at 01/01/13 1737  . Marland KitchenTPN (CLINIMIX-E) Adult 60 mL/hr at 01/01/13 1712   And  . fat emulsion 250 mL (01/01/13 1712)    Current Nutrition:  NPO with NG tube  Nutritional Goals:  Per RD 10/24:  Kcal: 1650-1900  Protein: 75-90 g  Fluid: >1.7 L  Clinimix E 5/15 at goal rate of 75 ml/hr + 20% Lipids at 70ml/hr will provide 90g protein, 1656 kcal per day  NS + KCl at [facility]  Assessment: 79 yo female s/p small bowel resection and tissue repair of right femoral hernia on 10/20. Diet advanced to clears on 10/24 but had significant abdominal  pain, nausea and vomiting, 10/26 abd film revealed dilated small bowel loops suspicious for SBO. Patient NPO, order to place PICC and begin TNA on 10/25. Initially (Per IV team), patient refused PICC placement on 10/25, but was agreeable to placement 10/26  10/28: Day #3 TPN, NGT fell out, appears having several small BMs  Glucose: CBGs at goal of < 150, 2 units SSI insulin required/24h Electrolytes: Na = 132 (suspect from TPN, unable to adjust in premixed TPN product), K = 3.6 (suspect will improve w/o NGT/output), Phos = 2.9, Mg = 1.8, Corr Ca WNL Renal function: wnl, UOP appears adequate Hepatic function: wnl  TG: 89 (10/27) Prealb: low, 8.6 (10/23), 5.1 (10/27)  Plan:   Increase Clinimix E 5/15 to goal 75 ml/hr as tolerating TPN   20% fat emulsion at 8 ml/hr (to keep fat calories to <30% total kcals).  TNA to contain standard multivitamins and trace elements.  Continue CBG/SSI q8h .   TNA lab panels on Mondays & Thursdays.  Labs in am  Juliette Alcide, PharmD, BCPS.   Pager: [phone] 01/02/2013,8:28 AM

## 2013-01-03 ENCOUNTER — Inpatient Hospital Stay (HOSPITAL_COMMUNITY): Payer: Medicare Other

## 2013-01-03 DIAGNOSIS — D72829 Elevated white blood cell count, unspecified: Secondary | ICD-10-CM

## 2013-01-03 DIAGNOSIS — E46 Unspecified protein-calorie malnutrition: Secondary | ICD-10-CM

## 2013-01-03 DIAGNOSIS — J189 Pneumonia, unspecified organism: Secondary | ICD-10-CM

## 2013-01-03 DIAGNOSIS — I5022 Chronic systolic (congestive) heart failure: Secondary | ICD-10-CM

## 2013-01-03 DIAGNOSIS — E039 Hypothyroidism, unspecified: Secondary | ICD-10-CM

## 2013-01-03 LAB — CULTURE, RESPIRATORY

## 2013-01-03 LAB — CULTURE, RESPIRATORY W GRAM STAIN

## 2013-01-03 LAB — BASIC METABOLIC PANEL
BUN: 18 mg/dL (ref 6–23)
Calcium: 8.2 mg/dL — ABNORMAL LOW (ref 8.4–10.5)
Chloride: 103 mEq/L (ref 96–112)
GFR calc Af Amer: >90 mL/min (ref >90)
Potassium: 3.4 mEq/L — ABNORMAL LOW (ref 3.5–5.1)

## 2013-01-03 LAB — PHOSPHORUS: Phosphorus: 4.2 mg/dL (ref 2.3–4.6)

## 2013-01-03 LAB — GLUCOSE, CAPILLARY
Glucose-Capillary: 119 mg/dL — ABNORMAL HIGH (ref 70–99)
Glucose-Capillary: 127 mg/dL — ABNORMAL HIGH (ref 70–99)

## 2013-01-03 LAB — MAGNESIUM: Magnesium: 1.8 mg/dL (ref 1.5–2.5)

## 2013-01-03 MED ORDER — POTASSIUM CHLORIDE 10 MEQ/100ML IV SOLN
10.0000 meq | INTRAVENOUS | Status: AC
  Administered 2013-01-03 (×4): 10 meq via INTRAVENOUS
  Filled 2013-01-03 (×4): qty 100

## 2013-01-03 MED ORDER — TRACE MINERALS CR-CU-F-FE-I-MN-MO-SE-ZN IV SOLN
INTRAVENOUS | Status: AC
  Administered 2013-01-03: 18:00:00 via INTRAVENOUS
  Filled 2013-01-03: qty 2000

## 2013-01-03 MED ORDER — FAT EMULSION 20 % IV EMUL
250.0000 mL | INTRAVENOUS | Status: AC
  Administered 2013-01-03: 250 mL via INTRAVENOUS
  Filled 2013-01-03: qty 250

## 2013-01-03 NOTE — Progress Notes (Signed)
PARENTERAL NUTRITION CONSULT NOTE   Pharmacy Consult for TNA Indication: Ileus  Allergies  Allergen Reactions  . Hydrocodone Itching  . Cephalexin Itching and Swelling  . Doxycycline Other (See Comments)    Per pt: unknown  . Ofloxacin Other (See Comments)    Per pt: unknown  . Tape Other (See Comments)    Burns skin.   . Latex Rash    Patient Measurements: Height: 5\' 8"  (172.7 cm) Weight: 138 lb 7.2 oz (62.8 kg) IBW/kg (Calculated) : 63.9  Vital Signs: Temp: 98 F (36.7 C) (10/29 0518) Temp src: Oral (10/29 0518) BP: 156/59 mmHg (10/29 0518) Pulse Rate: 68 (10/29 0518) Intake/Output from previous day: 10/28 0701 - 10/29 0700 In: 2053.8 [I.V.:325; IV Piggyback:500; TPN:1228.8] Out: 1248 [Urine:1201; Drains:45; Stool:2] Intake/Output from this shift:    Labs:  Recent Labs  01/01/13 0614 01/02/13 0455  WBC 24.2* 21.1*  HGB 12.5 12.6  HCT 36.9 36.4  PLT 148* 165     Recent Labs  01/01/13 0614 01/02/13 0455 01/03/13 0625  NA 139 132* 133*  K 3.4* 3.6 3.4*  CL 108 100 103  CO2 22 23 23   GLUCOSE 143* 117* 116*  BUN 17 14 18   CREATININE 0.65 0.57 0.59  CALCIUM 8.5 8.1* 8.2*  MG 1.6 1.8 1.8  PHOS 2.0* 2.9 4.2  PROT 5.2*  --   --   ALBUMIN 2.3*  --   --   AST 16  --   --   ALT 17  --   --   ALKPHOS 74  --   --   BILITOT 0.3  --   --   PREALBUMIN 5.1*  --   --   TRIG 89  --   --    Estimated Creatinine Clearance: 57.5 ml/min (by C-G formula based on Cr of 0.59).    Recent Labs  01/02/13 0801 01/02/13 1631 01/03/13 0003  GLUCAP 117* 91 127*    Medical History: Past Medical History  Diagnosis Date  . Nonischemic cardiomyopathy     EF initially 20%; Last measurement up to 50% per echo in August of 2012  . CHF (congestive heart failure)   . LBBB (left bundle branch block)   . Mitral regurgitation   . Melanoma     right arm  . Hypothyroidism   . Erosive gastritis 08/2004  . Hyperplastic polyps of stomach 06/2002  . Barrett's esophagus  04/2008  . GERD (gastroesophageal reflux disease)   . Diverticulitis   . Anxiety disorder   . HTN (hypertension)   . ICD (implantable cardiac defibrillator) in place     BiV/ICD; s/p removal of ICD with insertion of BiV PPM 04/26/12  . Hair thinning   . Unspecified sinusitis (chronic)   . Unspecified chronic bronchitis   . Depressive disorder, not elsewhere classified   . Restless legs syndrome (RLS)   . Chronic airway obstruction, not elsewhere classified   . Benign neoplasm of colon   . Other and unspecified hyperlipidemia   . Unspecified nasal polyp   . Pain in joint, lower leg   . Synovial cyst of popliteal space   . Complication of anesthesia     slow to wake up  . Pacemaker 04/26/2012  . Sleep apnea   . Other and unspecified hyperlipidemia   . Dysthymic disorder   . Palpitations     Medications:  Scheduled:  . aztreonam  1 g Intravenous Q8H  . digoxin  0.125 mg Intravenous Daily  . enoxaparin (  LOVENOX) injection  40 mg Subcutaneous Q24H  . famotidine (PEPCID) IV  20 mg Intravenous Q12H  . feeding supplement (RESOURCE BREEZE)  1 Container Oral TID BM  . insulin aspart  0-9 Units Subcutaneous Q8H  . levothyroxine  50 mcg Intravenous Daily  . metoprolol  5 mg Intravenous Q6H  . sodium chloride  10-40 mL Intracatheter Q12H  . vancomycin  1,000 mg Intravenous Q12H   Infusions:  . 0.9 % NaCl with KCl 20 mEq / L 20 mL/hr at 01/03/13 0308  . Marland KitchenTPN (CLINIMIX-E) Adult 75 mL/hr at 01/02/13 1742   And  . fat emulsion 250 mL (01/02/13 1743)    Current Nutrition:  NPO with NG tube  Nutritional Goals:  Per RD 10/24:  Kcal: 1650-1900  Protein: 75-90 g  Fluid: >1.7 L  Clinimix E 5/15 at goal rate of 75 ml/hr + 20% Lipids at 57ml/hr will provide 90g protein, 1656 kcal per day  NS + KCl at [facility]  Assessment: 79 yo female s/p small bowel resection and tissue repair of right femoral hernia on 10/20. Diet advanced to clears on 10/24 but had significant abdominal pain,  nausea and vomiting, 10/26 abd film revealed dilated small bowel loops suspicious for SBO. Patient NPO, order to place PICC and begin TNA on 10/25. Initially (Per IV team), patient refused PICC placement on 10/25, but was agreeable to placement 10/26  10/29: Day #4 TPN, NGT fell out, appears having several BMs  Glucose: CBGs at goal of < 150, 1 unit SSI insulin required/24h Electrolytes: Na = 133 (suspect from TPN, unable to adjust in premixed TPN product), K = 3.4, Phos = 4.2, Mg = 1.8, Corr Ca WNL Renal function: wnl, UOP appears adequate Hepatic function: wnl  TG: 89 (10/27) Prealb: low, 8.6 (10/23), 5.1 (10/27)  Plan:   Continue Clinimix E 5/15 to goal 75 ml/hr as tolerating TPN   20% fat emulsion at 8 ml/hr (to keep fat calories to <30% total kcals).  Hoping able to start clears today if no GI complaints  Replace K+ x 4 IV runs  TNA to contain standard multivitamins and trace elements.  Continue CBG/SSI q8h .   TNA lab panels on Mondays & Thursdays.  Await orders for diet and wean TPN as indicated  Juliette Alcide, PharmD, BCPS.   Pager: [phone] 01/03/2013,7:13 AM

## 2013-01-03 NOTE — Progress Notes (Signed)
[facility] PROGRESS NOTE CONSULT  [name] AVW:[id number] DOB: 1932-07-10 DOA: 12/22/2012 PCP: Kimber Relic, MD  Assessment/Plan:  Hospital-acquired pneumonia  -Continue vancomycin and aztreonam.  -Blood with no growth to date and sputum cultures with rare GPC- follow. -Strep pneumonia Antigen neg. -follow recheck wbc and if she continues to improve deescalate abx as appropriate  Leukocytosis  -secondary to pneumonia.  -continue abx as above, follow and recheck -CT abdomen without evidence for surgical complications.  Protein caloric malnutrition  -on TNA per surgery  Wide-Complex Tachycardia -As per cardiology. -No further recurrence.  Femoral hernia  -As per surgery.   Code Status: Full Code Family Communication: Patient only  Disposition Plan: To be determined   Antibiotics:  Vancomycin 12/31/2012-->  Aztreonam 12/31/2012-->   Subjective: expressing Frustration with slowly resolving ileus.  NG remains out. She denies cough and no SOB  Objective: Filed Vitals:   01/02/13 1420 01/02/13 2056 01/03/13 0033 01/03/13 0518  BP: 153/97 137/58 132/57 156/59  Pulse: 73 80 68 68  Temp: 98.1 F (36.7 C) 97.4 F (36.3 C)  98 F (36.7 C)  TempSrc: Oral Oral  Oral  Resp: 18 18  18   Height:      Weight:      SpO2: 90% 93%  92%    Intake/Output Summary (Last 24 hours) at 01/03/13 1232 Last data filed at 01/03/13 1052  Gross per 24 hour  Intake 2877.75 ml  Output   1022 ml  Net 1855.75 ml   Filed Weights   12/22/12 1632 12/26/12 1656  Weight: 63.9 kg (140 lb 14 oz) 62.8 kg (138 lb 7.2 oz)    Exam:   General:  AA Ox3  Cardiovascular: RRR  Respiratory: decreased BS at bases, no wheezes  Abdomen: S/NT/ND/hypoactive BS  Extremities: no C/C/E   Neurologic:  Grossly intact and non-focal  Data Reviewed: Basic Metabolic Panel:  Recent Labs Lab 12/29/12 0513 12/31/12 0500 01/01/13 0614 01/02/13 0455 01/03/13 0625  NA 142 140 139  132* 133*  K 4.1 3.8 3.4* 3.6 3.4*  CL 107 108 108 100 103  CO2 19 22 22 23 23   GLUCOSE 66* 120* 143* 117* 116*  BUN 13 23 17 14 18   CREATININE 0.59 1.00 0.65 0.57 0.59  CALCIUM 8.9 8.9 8.5 8.1* 8.2*  MG  --  1.7 1.6 1.8 1.8  PHOS  --  3.7 2.0* 2.9 4.2   Liver Function Tests:  Recent Labs Lab 12/31/12 0500 01/01/13 0614  AST 17 16  ALT 21 17  ALKPHOS 69 74  BILITOT 0.5 0.3  PROT 5.9* 5.2*  ALBUMIN 2.8* 2.3*   No results found for this basename: LIPASE, AMYLASE,  in the last 168 hours No results found for this basename: AMMONIA,  in the last 168 hours CBC:  Recent Labs Lab 12/28/12 0545 12/31/12 0500 01/01/13 0614 01/02/13 0455  WBC 7.5 23.6* 24.2* 21.1*  NEUTROABS  --  20.5* 21.0*  --   HGB 13.5 14.9 12.5 12.6  HCT 39.3 43.0 36.9 36.4  MCV 88.1 88.1 88.7 86.9  PLT 223 173 148* 165   Cardiac Enzymes: No results found for this basename: CKTOTAL, CKMB, CKMBINDEX, TROPONINI,  in the last 168 hours BNP (last 3 results) No results found for this basename: PROBNP,  in the last 8760 hours CBG:  Recent Labs Lab 01/02/13 0004 01/02/13 0801 01/02/13 1631 01/03/13 0003 01/03/13 0739  GLUCAP 120* 117* 91 127* 119*    Recent Results (from the past 240  hour(s))  CULTURE, BLOOD (ROUTINE X 2)     Status: None   Collection Time    12/31/12 12:00 PM      Result Value Range Status   Specimen Description BLOOD RIGHT ARM   Final   Special Requests BOTTLES DRAWN AEROBIC AND ANAEROBIC 2 CC EA   Final   Culture  Setup Time     Final   Value: 12/31/2012 17:46     Performed at [facility]   Culture     Final   Value:        BLOOD CULTURE RECEIVED NO GROWTH TO DATE CULTURE WILL BE HELD FOR 5 DAYS BEFORE ISSUING A FINAL NEGATIVE REPORT     Performed at [facility]   Report Status PENDING   Incomplete  CULTURE, BLOOD (ROUTINE X 2)     Status: None   Collection Time    12/31/12 12:15 PM      Result Value Range Status   Specimen Description BLOOD LEFT HAND    Final   Special Requests BOTTLES DRAWN AEROBIC AND ANAEROBIC 2 CC EA   Final   Culture  Setup Time     Final   Value: 12/31/2012 17:46     Performed at [facility]   Culture     Final   Value:        BLOOD CULTURE RECEIVED NO GROWTH TO DATE CULTURE WILL BE HELD FOR 5 DAYS BEFORE ISSUING A FINAL NEGATIVE REPORT     Performed at [facility]   Report Status PENDING   Incomplete  CULTURE, EXPECTORATED SPUTUM-ASSESSMENT     Status: None   Collection Time    01/01/13  5:52 AM      Result Value Range Status   Specimen Description SPUTUM   Final   Special Requests NONE   Final   Sputum evaluation     Final   Value: THIS SPECIMEN IS ACCEPTABLE. RESPIRATORY CULTURE REPORT TO FOLLOW.   Report Status 01/01/2013 FINAL   Final  CULTURE, RESPIRATORY (NON-EXPECTORATED)     Status: None   Collection Time    01/01/13  5:52 AM      Result Value Range Status   Specimen Description SPUTUM   Final   Special Requests NONE   Final   Gram Stain     Final   Value: ABUNDANT WBC PRESENT, PREDOMINANTLY PMN     RARE SQUAMOUS EPITHELIAL CELLS PRESENT     RARE GRAM POSITIVE COCCI     IN PAIRS RARE GRAM NEGATIVE RODS     Performed at [facility]   Culture     Final   Value: NORMAL OROPHARYNGEAL FLORA     Performed at [facility]   Report Status 01/03/2013 FINAL   Final     Studies: Dg Abd 2 Views  01/03/2013   CLINICAL DATA:  History of incarcerated hernia, postop. Question small bowel obstruction.  EXAM: ABDOMEN - 2 VIEW  COMPARISON:  CT 01/01/2013. Plain films 05/03/2012.  FINDINGS: Bowel-gas pattern is stable or slightly worsened with dilated small bowel loops data the abdomen and pelvis. Multiple air-fluid levels. Colon is decompressed. There is contrast material noted within the colon. Cannot exclude small bowel obstruction.  No free air. No organomegaly. Bibasilar atelectasis.  IMPRESSION: Stable or slightly worsened small bowel dilatation. Findings suspicious for  partial small bowel obstruction.   Electronically Signed   By: Charlett Nose M.D.   On: 01/03/2013 11:15  Scheduled Meds: . aztreonam  1 g Intravenous Q8H  . digoxin  0.125 mg Intravenous Daily  . enoxaparin (LOVENOX) injection  40 mg Subcutaneous Q24H  . famotidine (PEPCID) IV  20 mg Intravenous Q12H  . feeding supplement (RESOURCE BREEZE)  1 Container Oral TID BM  . insulin aspart  0-9 Units Subcutaneous Q8H  . levothyroxine  50 mcg Intravenous Daily  . metoprolol  5 mg Intravenous Q6H  . potassium chloride  10 mEq Intravenous Q1 Hr x 4  . sodium chloride  10-40 mL Intracatheter Q12H  . vancomycin  1,000 mg Intravenous Q12H   Continuous Infusions: . 0.9 % NaCl with KCl 20 mEq / L 20 mL/hr at 01/03/13 0308  . Marland KitchenTPN (CLINIMIX-E) Adult 75 mL/hr at 01/02/13 1742   And  . fat emulsion 250 mL (01/02/13 1743)  . Marland KitchenTPN (CLINIMIX-E) Adult     And  . fat emulsion      Principal Problem:   Femoral hernia with gangrene and obstruction Active Problems:   Unspecified intestinal obstruction   Malnutrition of moderate degree   Moderate malnutrition   HCAP (healthcare-associated pneumonia)   Leukocytosis, unspecified    Time spent: 25 minutes    Berlinda Farve C  [facility] Pager [phone]  If 7PM-7AM, please contact night-coverage at [email or web address], password [facility] 01/03/2013, 12:32 PM  LOS: 12 days

## 2013-01-03 NOTE — Progress Notes (Signed)
NUTRITION FOLLOW UP  Intervention:   1. Continue TPN per Pharmacy 2. Recommend advancing diet to clear liquids; continue Resource Breeze TID when diet is advanced. 3. Recommend daily weights 4. RD will continue to monitor nutrition plan of care.   Nutrition Dx:   Inadequate oral intake related to inability to eat as evidenced by NPO, ongoing.   Goal:   Patient will meet >/=90% of estimated nutrition needs; being met by TPN  Monitor:   Weight; no recent weight Diet advancement; pt NPO, TNA Labs; Electrolytes WNL, Prealbumin 7.6  Assessment:   Patient is s/p small bowel resection and tissue repair of right femoral hernia on 10/20. Pt's diet was advanced to clear liquids 10/22 and NG tube was removed, but had multiple episodes of vomiting, increase in abdominal pain. CCS suspects ileus. Patient currently NPO, TNA started 10/25. NG tube replaced today.   TNA: Clinimix 5/15 at goal rate of 75 ml/hr with 20% lipids at 8 ml/hr, which will provide 1656 kcal, 90 g protein (100% estimated needs).  NGT has been removed. Pt up in chair at time of visit; pt comfortable and requesting some food to eat. No new weight since 10/21. Asked RN to please weight pt when she returns to bed. Per MD note pt has some flatus but, no other BM's. Per nursing notes, pt had a BM 10/28.   Height: Ht Readings from Last 1 Encounters:  12/26/12 5\' 8"  (1.727 m)    Weight Status:   Wt Readings from Last 1 Encounters:  12/26/12 138 lb 7.2 oz (62.8 kg)    Re-estimated needs:  Kcal: 1650-1900 kcal Protein: 75-90 g  Fluid: 1.7 L/day  Skin: Abdominal and groin incisions  Diet Order: NPO   Intake/Output Summary (Last 24 hours) at 01/03/13 1340 Last data filed at 01/03/13 1331  Gross per 24 hour  Intake 2727.75 ml  Output   1172 ml  Net 1555.75 ml    Last BM: 10/28   Labs:   Recent Labs Lab 01/01/13 0614 01/02/13 0455 01/03/13 0625  NA 139 132* 133*  K 3.4* 3.6 3.4*  CL 108 100 103  CO2 22 23  23   BUN 17 14 18   CREATININE 0.65 0.57 0.59  CALCIUM 8.5 8.1* 8.2*  MG 1.6 1.8 1.8  PHOS 2.0* 2.9 4.2  GLUCOSE 143* 117* 116*    CBG (last 3)   Recent Labs  01/02/13 1631 01/03/13 0003 01/03/13 0739  GLUCAP 91 127* 119*    Scheduled Meds: . aztreonam  1 g Intravenous Q8H  . digoxin  0.125 mg Intravenous Daily  . enoxaparin (LOVENOX) injection  40 mg Subcutaneous Q24H  . famotidine (PEPCID) IV  20 mg Intravenous Q12H  . feeding supplement (RESOURCE BREEZE)  1 Container Oral TID BM  . insulin aspart  0-9 Units Subcutaneous Q8H  . levothyroxine  50 mcg Intravenous Daily  . metoprolol  5 mg Intravenous Q6H  . sodium chloride  10-40 mL Intracatheter Q12H  . vancomycin  1,000 mg Intravenous Q12H    Continuous Infusions: . 0.9 % NaCl with KCl 20 mEq / L 20 mL/hr at 01/03/13 0308  . Marland KitchenTPN (CLINIMIX-E) Adult 75 mL/hr at 01/02/13 1742   And  . fat emulsion 250 mL (01/02/13 1743)  . Marland KitchenTPN (CLINIMIX-E) Adult     And  . fat emulsion      Ian Malkin RD, LDN Inpatient Clinical Dietitian Pager: [phone] After Hours Pager: [phone]

## 2013-01-03 NOTE — Progress Notes (Signed)
Patient ID: [name], female   DOB: 1932-10-20, 79 y.o.   MRN: [id number] 9 Days Post-Op  Subjective: Pt feels ok today, slight crampy abdominal pains.  Still without nausea.  Some flatus, but no other BMs  Objective: Vital signs in last 24 hours: Temp:  [97.4 F (36.3 C)-98.1 F (36.7 C)] 98 F (36.7 C) (10/29 0518) Pulse Rate:  [68-80] 68 (10/29 0518) Resp:  [18] 18 (10/29 0518) BP: (132-156)/(57-97) 156/59 mmHg (10/29 0518) SpO2:  [90 %-93 %] 92 % (10/29 0518) Last BM Date: 01/02/13  Intake/Output from previous day: 10/28 0701 - 10/29 0700 In: 2927.8 [I.V.:485; IV Piggyback:550; TPN:1892.8] Out: 1253 [Urine:1201; Drains:50; Stool:2] Intake/Output this shift:    PE: Abd: soft, but increased lower abdominal distention from yesterday, tympanitic, hypoactive BS.  Incision c/d/i with staples/  JP with serous output   Lab Results:   Recent Labs  01/01/13 0614 01/02/13 0455  WBC 24.2* 21.1*  HGB 12.5 12.6  HCT 36.9 36.4  PLT 148* 165   BMET  Recent Labs  01/02/13 0455 01/03/13 0625  NA 132* 133*  K 3.6 3.4*  CL 100 103  CO2 23 23  GLUCOSE 117* 116*  BUN 14 18  CREATININE 0.57 0.59  CALCIUM 8.1* 8.2*   PT/INR No results found for this basename: LABPROT, INR,  in the last 72 hours CMP     Component Value Date/Time   NA 133* 01/03/2013 0625   NA 136 12/07/2012 1422   K 3.4* 01/03/2013 0625   CL 103 01/03/2013 0625   CO2 23 01/03/2013 0625   GLUCOSE 116* 01/03/2013 0625   GLUCOSE 90 12/07/2012 1422   BUN 18 01/03/2013 0625   BUN 17 12/07/2012 1422   CREATININE 0.59 01/03/2013 0625   CALCIUM 8.2* 01/03/2013 0625   PROT 5.2* 01/01/2013 0614   PROT 5.8* 12/07/2012 1422   ALBUMIN 2.3* 01/01/2013 0614   AST 16 01/01/2013 0614   ALT 17 01/01/2013 0614   ALKPHOS 74 01/01/2013 0614   BILITOT 0.3 01/01/2013 0614   GFRNONAA 86* 01/03/2013 0625   GFRAA >90 01/03/2013 0625   Lipase     Component Value Date/Time   LIPASE 15 12/20/2012 2320        Studies/Results: Ct Abdomen Pelvis W Contrast  01/01/2013   CLINICAL DATA:  Leukocytosis. Recent incarcerated femoral hernia repair.  EXAM: CT ABDOMEN AND PELVIS WITH CONTRAST  TECHNIQUE: Multidetector CT imaging of the abdomen and pelvis was performed using the standard protocol following bolus administration of intravenous contrast.  CONTRAST:  80mL OMNIPAQUE IOHEXOL 300 MG/ML  SOLN  COMPARISON:  12/24/2012 and 12/11/2012.  FINDINGS: Lung bases show patchy airspace consolidation and ground-glass involving the right middle lobe and both lower lobes. Tiny bilateral pleural effusions. Heart size normal. No pericardial effusion.  Mild intrahepatic biliary ductal dilatation appears unchanged. Small. Hepatic ascites. Gallbladder is decompressed. Adrenal glands are unremarkable. Sub cm low-attenuation lesions in the kidneys are again noted and unchanged. Spleen is unremarkable. Difficult to exclude a 9 mm splenic artery aneurysm in the splenic hilum. Favor fat invagination in the pancreatic neck rather than a true lesion (image 34). Nasogastric tube terminates in the stomach. Mild dilatation of fluid-filled small bowel loops without a discrete transition point. Anastomotic sutures are seen in the distal small bowel. Distal small bowel loops do appear decompressed. A fair amount of stool is seen in the colon.  Small ascites. Atherosclerotic calcification of the arterial vasculature without abdominal aortic aneurysm. A soft tissue drain  is seen in the right inguinal region. Postoperative changes of right inguinal hernia repair with small reactive inguinal lymph nodes. Degenerative changes are seen in the spine. No worrisome lytic or sclerotic lesions.  IMPRESSION: 1. Postoperative ileus versus early or partial small bowel obstruction. 2. Postoperative changes of right femoral hernia repair and small bowel resection. 3. Patchy right middle, right lower and left lower lobe airspace disease, indicative of  pneumonia. 4. Tiny bilateral effusions. 5. Small ascites. 6. Possible small splenic artery aneurysm.   Electronically Signed   By: Leanna Battles M.D.   On: 01/01/2013 13:54    Anti-infectives: Anti-infectives   Start     Dose/Rate Route Frequency Ordered Stop   01/02/13 2000  vancomycin (VANCOCIN) IVPB 1000 mg/200 mL premix     1,000 mg 200 mL/hr over 60 Minutes Intravenous Every 12 hours 01/02/13 1254 01/08/13 0759   01/01/13 0000  vancomycin (VANCOCIN) 500 mg in sodium chloride 0.9 % 100 mL IVPB  Status:  Discontinued     500 mg 100 mL/hr over 60 Minutes Intravenous Every 12 hours 12/31/12 1133 01/02/13 1254   12/31/12 1200  aztreonam (AZACTAM) 1 g in dextrose 5 % 50 mL IVPB     1 g 100 mL/hr over 30 Minutes Intravenous Every 8 hours 12/31/12 1120 01/08/13 1159   12/31/12 1200  vancomycin (VANCOCIN) IVPB 1000 mg/200 mL premix     1,000 mg 200 mL/hr over 60 Minutes Intravenous  Once 12/31/12 1133 12/31/12 1413   12/26/12 0800  ertapenem (INVANZ) 1 g in sodium chloride 0.9 % 50 mL IVPB     1 g 100 mL/hr over 30 Minutes Intravenous Every 24 hours 12/25/12 1529 12/26/12 0820   12/25/12 1100  [MAR Hold]  ertapenem (INVANZ) 1 g in sodium chloride 0.9 % 50 mL IVPB     (On MAR Hold since 12/25/12 1112)   1 g 100 mL/hr over 30 Minutes Intravenous On call to O.R. 12/25/12 1045 12/25/12 1145   12/25/12 0800  cefOXitin (MEFOXIN) 2 g in dextrose 5 % 50 mL IVPB  Status:  Discontinued     2 g 100 mL/hr over 30 Minutes Intravenous On call to O.R. 12/25/12 0750 12/25/12 1544       Assessment/Plan  1. S/p SBR and repair of incarcerated femoral hernia 2. Prolonged post op ileus vs PSBO 3. PCM/TNA 4. Hypokalemia 5. Bilateral PNA  Plan: 1. Repeat abdominal films today.  Patient seems quite a bit more distended today than yesterday.  Wary that NGT may have to be replaced.  Unclear whether this is ileus vs PSBO.  2. Replete K 3. Cont TNA while patient remains NPO 4. Appreciate medicine and  cards assistance 5. Check labs in am   LOS: 12 days    OSBORNE,KELLY E 01/03/2013, 9:30 AM Pager: [phone]  Abdomen again distended.  xrays with dilation of small bowel.  We have tried to remove the NG tube twice without success now.  I will plan on discussing surgical options with her of exploratory laparotomy to evaluate the source of her blockage.

## 2013-01-03 NOTE — Progress Notes (Signed)
Subjective:  No chest pain or dyspnea. No palpitations.  She is tolerating being without her NG tube.  Objective:  Vital Signs in the last 24 hours: Temp:  [97.4 F (36.3 C)-98.1 F (36.7 C)] 98 F (36.7 C) (10/29 0518) Pulse Rate:  [68-80] 68 (10/29 0518) Resp:  [18] 18 (10/29 0518) BP: (132-156)/(57-97) 156/59 mmHg (10/29 0518) SpO2:  [90 %-93 %] 92 % (10/29 0518)  Intake/Output from previous day: 10/28 0701 - 10/29 0700 In: 2927.8 [I.V.:485; IV Piggyback:550; TPN:1892.8] Out: 1253 [Urine:1201; Drains:50; Stool:2] Intake/Output from this shift:    . aztreonam  1 g Intravenous Q8H  . digoxin  0.125 mg Intravenous Daily  . enoxaparin (LOVENOX) injection  40 mg Subcutaneous Q24H  . famotidine (PEPCID) IV  20 mg Intravenous Q12H  . feeding supplement (RESOURCE BREEZE)  1 Container Oral TID BM  . insulin aspart  0-9 Units Subcutaneous Q8H  . levothyroxine  50 mcg Intravenous Daily  . metoprolol  5 mg Intravenous Q6H  . potassium chloride  10 mEq Intravenous Q1 Hr x 4  . sodium chloride  10-40 mL Intracatheter Q12H  . vancomycin  1,000 mg Intravenous Q12H   . 0.9 % NaCl with KCl 20 mEq / L 20 mL/hr at 01/03/13 0308  . Marland KitchenTPN (CLINIMIX-E) Adult 75 mL/hr at 01/02/13 1742   And  . fat emulsion 250 mL (01/02/13 1743)  . Marland KitchenTPN (CLINIMIX-E) Adult     And  . fat emulsion      Physical Exam: The patient appears to be in no distress.  Head and neck exam reveals that the pupils are equal and reactive.  The extraocular movements are full.  There is no scleral icterus.  Mouth and pharynx are benign.  No lymphadenopathy.  No carotid bruits.  The jugular venous pressure is normal.  Thyroid is not enlarged or tender.  Chest is clear to percussion and auscultation.  No rales or rhonchi.  Expansion of the chest is symmetrical.  Heart reveals no abnormal lift or heave.  First and second heart sounds are normal.  There is no murmur gallop rub or click.  The abdomen is soft and  nontender.  Bowel sounds are soft.  There is no hepatosplenomegaly or mass.  There are no abdominal bruits.  Extremities reveal no phlebitis or edema.  Pedal pulses are good.  There is no cyanosis or clubbing.  Neurologic exam is normal strength and no lateralizing weakness.  No sensory deficits.  Integument reveals no rash  Lab Results:  Recent Labs  01/01/13 0614 01/02/13 0455  WBC 24.2* 21.1*  HGB 12.5 12.6  PLT 148* 165    Recent Labs  01/02/13 0455 01/03/13 0625  NA 132* 133*  K 3.6 3.4*  CL 100 103  CO2 23 23  GLUCOSE 117* 116*  BUN 14 18  CREATININE 0.57 0.59   No results found for this basename: TROPONINI, CK, MB,  in the last 72 hours Hepatic Function Panel  Recent Labs  01/01/13 0614  PROT 5.2*  ALBUMIN 2.3*  AST 16  ALT 17  ALKPHOS 74  BILITOT 0.3   No results found for this basename: CHOL,  in the last 72 hours No results found for this basename: PROTIME,  in the last 72 hours  Imaging: Ct Abdomen Pelvis W Contrast  01/01/2013   CLINICAL DATA:  Leukocytosis. Recent incarcerated femoral hernia repair.  EXAM: CT ABDOMEN AND PELVIS WITH CONTRAST  TECHNIQUE: Multidetector CT imaging of the abdomen  and pelvis was performed using the standard protocol following bolus administration of intravenous contrast.  CONTRAST:  80mL OMNIPAQUE IOHEXOL 300 MG/ML  SOLN  COMPARISON:  12/24/2012 and 12/11/2012.  FINDINGS: Lung bases show patchy airspace consolidation and ground-glass involving the right middle lobe and both lower lobes. Tiny bilateral pleural effusions. Heart size normal. No pericardial effusion.  Mild intrahepatic biliary ductal dilatation appears unchanged. Small. Hepatic ascites. Gallbladder is decompressed. Adrenal glands are unremarkable. Sub cm low-attenuation lesions in the kidneys are again noted and unchanged. Spleen is unremarkable. Difficult to exclude a 9 mm splenic artery aneurysm in the splenic hilum. Favor fat invagination in the pancreatic  neck rather than a true lesion (image 34). Nasogastric tube terminates in the stomach. Mild dilatation of fluid-filled small bowel loops without a discrete transition point. Anastomotic sutures are seen in the distal small bowel. Distal small bowel loops do appear decompressed. A fair amount of stool is seen in the colon.  Small ascites. Atherosclerotic calcification of the arterial vasculature without abdominal aortic aneurysm. A soft tissue drain is seen in the right inguinal region. Postoperative changes of right inguinal hernia repair with small reactive inguinal lymph nodes. Degenerative changes are seen in the spine. No worrisome lytic or sclerotic lesions.  IMPRESSION: 1. Postoperative ileus versus early or partial small bowel obstruction. 2. Postoperative changes of right femoral hernia repair and small bowel resection. 3. Patchy right middle, right lower and left lower lobe airspace disease, indicative of pneumonia. 4. Tiny bilateral effusions. 5. Small ascites. 6. Possible small splenic artery aneurysm.   Electronically Signed   By: Leanna Battles M.D.   On: 01/01/2013 13:54    Cardiac Studies: Telemetry shows biventricular paced rhythm . Assessment/Plan:  1. Nonischemic cardiomyopathy. Appears euvolemic now. 2. NSVT improved since pacemaker adjusted. No further VT seen. 3. Post-op ileus, improving slowly  4. Abnormal chest xray ? Multifocal pneumonia.  Plan: continue IV lanoxin and metoprolol. Switch to oral route when able.   LOS: 12 days    Cassell Clement 01/03/2013, 8:19 AM

## 2013-01-04 ENCOUNTER — Encounter (HOSPITAL_COMMUNITY): Admission: AD | Disposition: A | Discharge status: Skilled Nursing Facility | Payer: Self-pay | Source: Ambulatory Visit

## 2013-01-04 ENCOUNTER — Inpatient Hospital Stay (HOSPITAL_COMMUNITY): Payer: Medicare Other | Admitting: Anesthesiology

## 2013-01-04 ENCOUNTER — Encounter (HOSPITAL_COMMUNITY): Payer: Medicare Other | Admitting: Anesthesiology

## 2013-01-04 DIAGNOSIS — K4131 Unilateral femoral hernia, with obstruction, without gangrene, recurrent: Secondary | ICD-10-CM

## 2013-01-04 DIAGNOSIS — K56609 Unspecified intestinal obstruction, unspecified as to partial versus complete obstruction: Secondary | ICD-10-CM

## 2013-01-04 HISTORY — PX: FEMORAL HERNIA REPAIR: SHX632

## 2013-01-04 HISTORY — PX: LAPAROSCOPY: SHX197

## 2013-01-04 LAB — COMPREHENSIVE METABOLIC PANEL
Albumin: 2.2 g/dL — ABNORMAL LOW (ref 3.5–5.2)
BUN: 20 mg/dL (ref 6–23)
Calcium: 8.6 mg/dL (ref 8.4–10.5)
Creatinine, Ser: 0.62 mg/dL (ref 0.50–1.10)
Potassium: 4.1 mEq/L (ref 3.5–5.1)
Total Protein: 5.7 g/dL — ABNORMAL LOW (ref 6.0–8.3)

## 2013-01-04 LAB — PHOSPHORUS: Phosphorus: 4.2 mg/dL (ref 2.3–4.6)

## 2013-01-04 LAB — CBC
Hemoglobin: 12.2 g/dL (ref 12.0–15.0)
MCHC: 34.3 g/dL (ref 30.0–36.0)
RBC: 4.06 MIL/uL (ref 3.87–5.11)
WBC: 13.6 10*3/uL — ABNORMAL HIGH (ref 4.0–10.5)

## 2013-01-04 LAB — MAGNESIUM: Magnesium: 1.9 mg/dL (ref 1.5–2.5)

## 2013-01-04 LAB — GLUCOSE, CAPILLARY
Glucose-Capillary: 101 mg/dL — ABNORMAL HIGH (ref 70–99)
Glucose-Capillary: 115 mg/dL — ABNORMAL HIGH (ref 70–99)
Glucose-Capillary: 133 mg/dL — ABNORMAL HIGH (ref 70–99)

## 2013-01-04 LAB — MRSA PCR SCREENING: MRSA by PCR: NEGATIVE

## 2013-01-04 SURGERY — LAPAROSCOPY, DIAGNOSTIC
Anesthesia: General | Site: Abdomen | Wound class: Clean Contaminated

## 2013-01-04 MED ORDER — NALOXONE HCL 0.4 MG/ML IJ SOLN: 0.4000 mg | INTRAMUSCULAR | Status: DC | PRN

## 2013-01-04 MED ORDER — ROCURONIUM BROMIDE 100 MG/10ML IV SOLN
INTRAVENOUS | Status: DC | PRN
  Administered 2013-01-04 (×3): 5 mg via INTRAVENOUS
  Administered 2013-01-04: 25 mg via INTRAVENOUS

## 2013-01-04 MED ORDER — ALBUMIN HUMAN 5 % IV SOLN
12.5000 g | Freq: Once | INTRAVENOUS | Status: AC
  Administered 2013-01-04: 12.5 g via INTRAVENOUS

## 2013-01-04 MED ORDER — 0.9 % SODIUM CHLORIDE (POUR BTL) OPTIME
TOPICAL | Status: DC | PRN
  Administered 2013-01-04: 1000 mL

## 2013-01-04 MED ORDER — FENTANYL CITRATE 0.05 MG/ML IJ SOLN
25.0000 ug | INTRAMUSCULAR | Status: DC | PRN
  Administered 2013-01-05 (×2): 50 ug via INTRAVENOUS
  Filled 2013-01-04 (×2): qty 2

## 2013-01-04 MED ORDER — FAT EMULSION 20 % IV EMUL
250.0000 mL | INTRAVENOUS | Status: AC
  Administered 2013-01-04 (×3): 8 mL via INTRAVENOUS
  Administered 2013-01-04: 250 mL via INTRAVENOUS
  Administered 2013-01-04: 8 mL via INTRAVENOUS
  Filled 2013-01-04: qty 250

## 2013-01-04 MED ORDER — MIDAZOLAM HCL 5 MG/5ML IJ SOLN
INTRAMUSCULAR | Status: DC | PRN
  Administered 2013-01-04: 0.5 mg via INTRAVENOUS

## 2013-01-04 MED ORDER — DIPHENHYDRAMINE HCL 50 MG/ML IJ SOLN: 12.5000 mg | Freq: Four times a day (QID) | INTRAMUSCULAR | Status: DC | PRN

## 2013-01-04 MED ORDER — PHENYLEPHRINE HCL 10 MG/ML IJ SOLN
INTRAMUSCULAR | Status: DC | PRN
  Administered 2013-01-04: 60 ug via INTRAVENOUS
  Administered 2013-01-04: 80 ug via INTRAVENOUS
  Administered 2013-01-04: 240 ug via INTRAVENOUS
  Administered 2013-01-04: 80 ug via INTRAVENOUS
  Administered 2013-01-04: 60 ug via INTRAVENOUS

## 2013-01-04 MED ORDER — BUPIVACAINE HCL 0.25 % IJ SOLN
INTRAMUSCULAR | Status: DC | PRN
  Administered 2013-01-04: 15 mL

## 2013-01-04 MED ORDER — LIDOCAINE-EPINEPHRINE 1 %-1:100000 IJ SOLN
INTRAMUSCULAR | Status: AC
  Filled 2013-01-04: qty 1

## 2013-01-04 MED ORDER — NEOSTIGMINE METHYLSULFATE 1 MG/ML IJ SOLN
INTRAMUSCULAR | Status: DC | PRN
  Administered 2013-01-04: 2.5 mg via INTRAVENOUS

## 2013-01-04 MED ORDER — LIDOCAINE-EPINEPHRINE 1 %-1:100000 IJ SOLN
INTRAMUSCULAR | Status: DC | PRN
  Administered 2013-01-04: 15 mL

## 2013-01-04 MED ORDER — CLINDAMYCIN PHOSPHATE 900 MG/50ML IV SOLN
INTRAVENOUS | Status: AC
  Filled 2013-01-04: qty 50

## 2013-01-04 MED ORDER — MORPHINE SULFATE (PF) 1 MG/ML IV SOLN
INTRAVENOUS | Status: AC
  Administered 2013-01-04: 1 mg
  Filled 2013-01-04: qty 25

## 2013-01-04 MED ORDER — PROMETHAZINE HCL 25 MG/ML IJ SOLN: 6.2500 mg | INTRAMUSCULAR | Status: DC | PRN

## 2013-01-04 MED ORDER — HYDROMORPHONE HCL PF 1 MG/ML IJ SOLN
INTRAMUSCULAR | Status: DC | PRN
  Administered 2013-01-04 (×4): 0.5 mg via INTRAVENOUS

## 2013-01-04 MED ORDER — BUPIVACAINE HCL (PF) 0.25 % IJ SOLN
INTRAMUSCULAR | Status: AC
  Filled 2013-01-04: qty 30

## 2013-01-04 MED ORDER — SODIUM CHLORIDE 0.9 % IJ SOLN: 9.0000 mL | INTRAMUSCULAR | Status: DC | PRN

## 2013-01-04 MED ORDER — GLYCOPYRROLATE 0.2 MG/ML IJ SOLN
INTRAMUSCULAR | Status: DC | PRN
  Administered 2013-01-04: 0.4 mg via INTRAVENOUS

## 2013-01-04 MED ORDER — MORPHINE SULFATE (PF) 1 MG/ML IV SOLN
INTRAVENOUS | Status: DC
  Administered 2013-01-05: 8 mg via INTRAVENOUS
  Administered 2013-01-05: 6 mg via INTRAVENOUS
  Administered 2013-01-05: 8 mg via INTRAVENOUS
  Administered 2013-01-05: 20:00:00 via INTRAVENOUS
  Administered 2013-01-05: 2.42 mg via INTRAVENOUS
  Administered 2013-01-06: 3 mg via INTRAVENOUS
  Administered 2013-01-06: 1 mg via INTRAVENOUS
  Administered 2013-01-06: 4 mg via INTRAVENOUS
  Administered 2013-01-06: 2 mg via INTRAVENOUS
  Administered 2013-01-06: 4 mg via INTRAVENOUS
  Administered 2013-01-06: 3 mg via INTRAVENOUS
  Administered 2013-01-06: 2 mg via INTRAVENOUS
  Administered 2013-01-07: 1 mg via INTRAVENOUS
  Filled 2013-01-04: qty 25

## 2013-01-04 MED ORDER — PHENYLEPHRINE HCL 10 MG/ML IJ SOLN
30.0000 ug/min | INTRAVENOUS | Status: DC
  Administered 2013-01-04: 30 ug/min via INTRAVENOUS
  Administered 2013-01-05: 25 ug/min via INTRAVENOUS
  Administered 2013-01-05 (×2): 35 ug/min via INTRAVENOUS
  Filled 2013-01-04 (×3): qty 1

## 2013-01-04 MED ORDER — FENTANYL CITRATE 0.05 MG/ML IJ SOLN
INTRAMUSCULAR | Status: DC | PRN
  Administered 2013-01-04: 100 ug via INTRAVENOUS
  Administered 2013-01-04 (×2): 50 ug via INTRAVENOUS
  Administered 2013-01-04: 25 ug via INTRAVENOUS
  Administered 2013-01-04: 100 ug via INTRAVENOUS

## 2013-01-04 MED ORDER — LIDOCAINE HCL (CARDIAC) 20 MG/ML IV SOLN
INTRAVENOUS | Status: DC | PRN
  Administered 2013-01-04: 50 mg via INTRAVENOUS

## 2013-01-04 MED ORDER — MEPERIDINE HCL 25 MG/ML IJ SOLN: 6.2500 mg | INTRAMUSCULAR | Status: DC | PRN

## 2013-01-04 MED ORDER — LACTATED RINGERS IV SOLN
INTRAVENOUS | Status: DC | PRN
  Administered 2013-01-04 (×2): via INTRAVENOUS

## 2013-01-04 MED ORDER — ONDANSETRON HCL 4 MG/2ML IJ SOLN: 4.0000 mg | Freq: Four times a day (QID) | INTRAMUSCULAR | Status: DC | PRN

## 2013-01-04 MED ORDER — SODIUM CHLORIDE 0.9 % IV BOLUS (SEPSIS)
500.0000 mL | Freq: Once | INTRAVENOUS | Status: AC
  Administered 2013-01-04: 500 mL via INTRAVENOUS

## 2013-01-04 MED ORDER — DIPHENHYDRAMINE HCL 12.5 MG/5ML PO ELIX: 12.5000 mg | ORAL_SOLUTION | Freq: Four times a day (QID) | ORAL | Status: DC | PRN

## 2013-01-04 MED ORDER — ONDANSETRON HCL 4 MG/2ML IJ SOLN
INTRAMUSCULAR | Status: DC | PRN
  Administered 2013-01-04: 4 mg via INTRAVENOUS

## 2013-01-04 MED ORDER — ALBUMIN HUMAN 5 % IV SOLN
INTRAVENOUS | Status: AC
  Filled 2013-01-04: qty 250

## 2013-01-04 MED ORDER — CLINDAMYCIN PHOSPHATE 900 MG/50ML IV SOLN
INTRAVENOUS | Status: DC | PRN
  Administered 2013-01-04: 900 mg via INTRAVENOUS

## 2013-01-04 MED ORDER — SUCCINYLCHOLINE CHLORIDE 20 MG/ML IJ SOLN
INTRAMUSCULAR | Status: DC | PRN
  Administered 2013-01-04: 80 mg via INTRAVENOUS

## 2013-01-04 MED ORDER — TRACE MINERALS CR-CU-F-FE-I-MN-MO-SE-ZN IV SOLN
INTRAVENOUS | Status: AC
  Administered 2013-01-04: 20:00:00 via INTRAVENOUS
  Filled 2013-01-04: qty 2000

## 2013-01-04 SURGICAL SUPPLY — 73 items
APPLIER CLIP 5 13 M/L LIGAMAX5 (MISCELLANEOUS)
APPLIER CLIP ROT 10 11.4 M/L (STAPLE)
BLADE EXTENDED COATED 6.5IN (ELECTRODE) ×2 IMPLANT
BLADE HEX COATED 2.75 (ELECTRODE) ×2 IMPLANT
BLADE SURG SZ10 CARB STEEL (BLADE) IMPLANT
CANISTER SUCTION 2500CC (MISCELLANEOUS) ×2 IMPLANT
CLIP APPLIE 5 13 M/L LIGAMAX5 (MISCELLANEOUS) IMPLANT
CLIP APPLIE ROT 10 11.4 M/L (STAPLE) IMPLANT
CLOTH BEACON ORANGE TIMEOUT ST (SAFETY) IMPLANT
COVER MAYO STAND STRL (DRAPES) ×2 IMPLANT
DECANTER SPIKE VIAL GLASS SM (MISCELLANEOUS) ×2 IMPLANT
DERMABOND ADVANCED (GAUZE/BANDAGES/DRESSINGS)
DERMABOND ADVANCED .7 DNX12 (GAUZE/BANDAGES/DRESSINGS) IMPLANT
DRAIN CHANNEL 15F RND FF 3/16 (WOUND CARE) ×2 IMPLANT
DRAPE LAPAROSCOPIC ABDOMINAL (DRAPES) ×2 IMPLANT
DRAPE WARM FLUID 44X44 (DRAPE) IMPLANT
ELECT REM PT RETURN 9FT ADLT (ELECTROSURGICAL) ×2
ELECTRODE REM PT RTRN 9FT ADLT (ELECTROSURGICAL) ×1 IMPLANT
EVACUATOR SILICONE 100CC (DRAIN) ×2 IMPLANT
FILTER SMOKE EVAC LAPAROSHD (FILTER) IMPLANT
GLOVE BIO SURGEON STRL SZ7 (GLOVE) ×2 IMPLANT
GLOVE BIOGEL M 7.0 STRL (GLOVE) ×2 IMPLANT
GLOVE BIOGEL PI IND STRL 7.0 (GLOVE) ×1 IMPLANT
GLOVE BIOGEL PI INDICATOR 7.0 (GLOVE) ×1
GLOVE SURG SS PI 7.5 STRL IVOR (GLOVE) ×4 IMPLANT
GOWN PREVENTION PLUS LG XLONG (DISPOSABLE) ×4 IMPLANT
GOWN STRL REIN XL XLG (GOWN DISPOSABLE) ×4 IMPLANT
KIT BASIN OR (CUSTOM PROCEDURE TRAY) ×2 IMPLANT
LIGASURE IMPACT 36 18CM CVD LR (INSTRUMENTS) IMPLANT
NS IRRIG 1000ML POUR BTL (IV SOLUTION) ×2 IMPLANT
PAD ABD 7.5X8 STRL (GAUZE/BANDAGES/DRESSINGS) ×2 IMPLANT
PENCIL BUTTON HOLSTER BLD 10FT (ELECTRODE) ×2 IMPLANT
RELOAD PROXIMATE 75MM BLUE (ENDOMECHANICALS) ×4 IMPLANT
SCALPEL HARMONIC ACE (MISCELLANEOUS) IMPLANT
SET IRRIG TUBING LAPAROSCOPIC (IRRIGATION / IRRIGATOR) IMPLANT
SLEEVE SURGEON STRL (DRAPES) ×2 IMPLANT
SOLUTION ANTI FOG 6CC (MISCELLANEOUS) ×2 IMPLANT
SPONGE DRAIN TRACH 4X4 STRL 2S (GAUZE/BANDAGES/DRESSINGS) ×2 IMPLANT
SPONGE GAUZE 4X4 12PLY (GAUZE/BANDAGES/DRESSINGS) ×2 IMPLANT
SPONGE LAP 18X18 X RAY DECT (DISPOSABLE) ×6 IMPLANT
STAPLER 90 3.5 STAND SLIM (STAPLE) ×2
STAPLER 90 3.5 STD SLIM (STAPLE) ×1 IMPLANT
STAPLER PROXIMATE 75MM BLUE (STAPLE) ×2 IMPLANT
STAPLER VISISTAT 35W (STAPLE) ×2 IMPLANT
STRIP CLOSURE SKIN 1/2X4 (GAUZE/BANDAGES/DRESSINGS) IMPLANT
SUCTION POOLE TIP (SUCTIONS) ×2 IMPLANT
SUT ETHILON 2 0 PS N (SUTURE) ×2 IMPLANT
SUT PDS AB 1 TP1 96 (SUTURE) ×4 IMPLANT
SUT PROLENE 2 0 KS (SUTURE) IMPLANT
SUT PROLENE 2 0 SH DA (SUTURE) ×4 IMPLANT
SUT SILK 2 0 (SUTURE) ×1
SUT SILK 2 0 SH CR/8 (SUTURE) IMPLANT
SUT SILK 2 0SH CR/8 30 (SUTURE) ×2 IMPLANT
SUT SILK 2-0 18XBRD TIE 12 (SUTURE) ×1 IMPLANT
SUT SILK 3 0 (SUTURE)
SUT SILK 3 0 SH CR/8 (SUTURE) ×2 IMPLANT
SUT SILK 3-0 18XBRD TIE 12 (SUTURE) IMPLANT
SUT VICRYL 0 UR6 27IN ABS (SUTURE) ×2 IMPLANT
SYRINGE IRR TOOMEY STRL 70CC (SYRINGE) ×2 IMPLANT
SYS LAPSCP GELPORT 120MM (MISCELLANEOUS)
SYSTEM LAPSCP GELPORT 120MM (MISCELLANEOUS) IMPLANT
TAPE CLOTH SURG 6X10 WHT LF (GAUZE/BANDAGES/DRESSINGS) ×2 IMPLANT
TOWEL OR 17X26 10 PK STRL BLUE (TOWEL DISPOSABLE) ×4 IMPLANT
TRAY FOLEY CATH 14FRSI W/METER (CATHETERS) ×2 IMPLANT
TRAY LAP CHOLE (CUSTOM PROCEDURE TRAY) ×2 IMPLANT
TROCAR BLADELESS OPT 5 75 (ENDOMECHANICALS) ×2 IMPLANT
TROCAR XCEL 12X100 BLDLESS (ENDOMECHANICALS) IMPLANT
TROCAR XCEL BLUNT TIP 100MML (ENDOMECHANICALS) IMPLANT
TROCAR XCEL NON-BLD 11X100MML (ENDOMECHANICALS) IMPLANT
TROCAR XCEL UNIV SLVE 11M 100M (ENDOMECHANICALS) IMPLANT
TUBING INSUFFLATION 10FT LAP (TUBING) ×2 IMPLANT
YANKAUER SUCT BULB TIP 10FT TU (MISCELLANEOUS) ×2 IMPLANT
YANKAUER SUCT BULB TIP NO VENT (SUCTIONS) ×2 IMPLANT

## 2013-01-04 NOTE — Progress Notes (Signed)
Occupational Therapy Treatment Patient Details Name: EMORIE MCFATE MRN: [id number] DOB: [birth year] Today's Date: 01/04/2013 Time: 1016-1050 OT Time Calculation (min): 34 min  OT Assessment / Plan / Recommendation  History of present illness 79 yo female admitted 12/22/12. S/P explaoratory lap, SB resection, primary repair strangulated femoral hernia on 12/25/12.   OT comments  Pt tolerated up to bathroom to use toilet but with periarea hygiene/some bathing at sink and changing gown/using shampoo cap, pt started to fatigue. Also pt informed of recommendation for surgery possibly later today by PA and pt starting to get tearful. Nursing in room. Pt assisted back to bed to rest.   Follow Up Recommendations  SNF    Barriers to Discharge       Equipment Recommendations  3 in 1 bedside comode    Recommendations for Other Services    Frequency Min 2X/week   Progress towards OT Goals Progress towards OT goals: Progressing toward goals  Plan Discharge plan remains appropriate    Precautions / Restrictions Precautions Precautions: Fall Precaution Comments: , R  abd drain. Restrictions Weight Bearing Restrictions: No   Pertinent Vitals/Pain Pt complaining of stomach pain. Didn't rate. Nursing in room and aware.    ADL  Toilet Transfer: Performed;Minimal Web designer: Comfort height toilet;Grab bars Toileting - Architect and Hygiene: Min guard Where Assessed - Toileting Clothing Manipulation and Hygiene: Standing ADL Comments: Pt up in room to bathroom to toilet with min assist and pushing IV pole. Pt stood at sink to wash periareas after toileting with min guard assist. Changed gown and assisted with shampoo cap per pt request. Pt fatigues with standing activity. Added goal for bathing and dressing. Pt complaining of stomach pain but didnt rate. Nursing present. Pt pending possible surgery later today also. Pt requesting to return to bed after washing  at sink.     OT Diagnosis:    OT Problem List:   OT Treatment Interventions:     OT Goals(current goals can now be found in the care plan section)    Visit Information  Last OT Received On: 01/04/13 Assistance Needed: +1 History of Present Illness: 79 yo female admitted 12/22/12. S/P explaoratory lap, SB resection, primary repair strangulated femoral hernia on 12/25/12.    Subjective Data      Prior Functioning       Cognition  Cognition Arousal/Alertness: Awake/alert Behavior During Therapy: WFL for tasks assessed/performed Overall Cognitive Status: Within Functional Limits for tasks assessed    Mobility  Bed Mobility Bed Mobility: Supine to Sit Supine to Sit: 6: Modified independent (Device/Increase time);HOB elevated Sit to Supine: 6: Modified independent (Device/Increase time);HOB elevated Details for Bed Mobility Assistance: mod I with hob slightly elevated Transfers Transfers: Sit to Stand;Stand to Sit Sit to Stand: 4: Min guard;With upper extremity assist;From toilet;From bed Stand to Sit: 4: Min guard;With upper extremity assist;To toilet;To bed Details for Transfer Assistance: pt struggles some with standing from toilet in bathroom. Has to hold to grab bar with both hands. Needs 3in1 for bilateral support.     Exercises      Balance Balance Balance Assessed: Yes Dynamic Standing Balance Dynamic Standing - Balance Support: No upper extremity supported Dynamic Standing - Level of Assistance: 5: Stand by assistance   End of Session OT - End of Session Activity Tolerance: Patient limited by fatigue Patient left: in bed;with call bell/phone within reach;with bed alarm set  GO     [name] [phone] 01/04/2013, 11:04 AM

## 2013-01-04 NOTE — Anesthesia Preprocedure Evaluation (Addendum)
Anesthesia Evaluation  Patient identified by MRN, date of birth, ID band Patient awake    Reviewed: Allergy & Precautions, H&P , NPO status , Patient's Chart, lab work & pertinent test results  Airway Mallampati: II TM Distance: >3 FB Neck ROM: Full    Dental  (+) Teeth Intact and Dental Advisory Given   Pulmonary sleep apnea , pneumonia -, COPDformer smoker,    + decreased breath sounds      Cardiovascular hypertension, +CHF + dysrhythmias + pacemaker + Cardiac Defibrillator + Valvular Problems/Murmurs MR Rhythm:Regular Rate:Normal     Neuro/Psych PSYCHIATRIC DISORDERS Anxiety Depression negative neurological ROS     GI/Hepatic Neg liver ROS, GERD-  Medicated,  Endo/Other  Hypothyroidism   Renal/GU negative Renal ROS     Musculoskeletal negative musculoskeletal ROS (+)   Abdominal   Peds  Hematology negative hematology ROS (+)   Anesthesia Other Findings   Reproductive/Obstetrics negative OB ROS                          Anesthesia Physical  Anesthesia Plan  ASA: III  Anesthesia Plan: General   Post-op Pain Management:    Induction: Intravenous, Rapid sequence and Cricoid pressure planned  Airway Management Planned: Oral ETT  Additional Equipment:   Intra-op Plan:   Post-operative Plan: Extubation in OR  Informed Consent: I have reviewed the patients History and Physical, chart, labs and discussed the procedure including the risks, benefits and alternatives for the proposed anesthesia with the patient or authorized representative who has indicated his/her understanding and acceptance.   Dental advisory given  Plan Discussed with: CRNA  Anesthesia Plan Comments:         Anesthesia Quick Evaluation

## 2013-01-04 NOTE — Brief Op Note (Signed)
12/22/2012 - 01/04/2013  7:08 PM  PATIENT:  [name]  79 y.o. female  PRE-OPERATIVE DIAGNOSIS:  small bowel obstruction  POST-OPERATIVE DIAGNOSIS:  small bowel obstruction  PROCEDURE:  Procedure(s): Diagnostic Laparoscopy, exploratory laparotomy with small bowel resection, closure of right femoral hernia repair (N/A)  SURGEON:  Surgeon(s) and Role:    * Lodema Pilot, DO - Primary  PHYSICIAN ASSISTANT:   ASSISTANTS: none   ANESTHESIA:   general  EBL:     BLOOD ADMINISTERED:none  DRAINS: none   LOCAL MEDICATIONS USED:  MARCAINE    and LIDOCAINE   SPECIMEN:  Source of Specimen:  prior anastamosis  DISPOSITION OF SPECIMEN:  N/A  COUNTS:  YES  TOURNIQUET:  * No tourniquets in log *  DICTATION: .Other Dictation: Dictation Number dictated  PLAN OF CARE: Admit to inpatient   PATIENT DISPOSITION:  PACU - hemodynamically stable.   Delay start of Pharmacological VTE agent (>24hrs) due to surgical blood loss or risk of bleeding: no

## 2013-01-04 NOTE — Preoperative (Signed)
Beta Blockers   Reason not to administer Beta Blockers:Hold beta blocker due to hypotension 

## 2013-01-04 NOTE — Transfer of Care (Signed)
Immediate Anesthesia Transfer of Care Note  Patient: [name]  Procedure(s) Performed: Procedure(s): Diagnostic Laparoscopy, exploratory laparotomy with small bowel resection, closure of right femoral hernia repair (N/A)  Patient Location: PACU  Anesthesia Type:General  Level of Consciousness: awake, sedated, patient cooperative and responds to stimulation  Airway & Oxygen Therapy: Patient Spontanous Breathing and Patient connected to face mask oxygen  Post-op Assessment: Report given to PACU RN, Post -op Vital signs reviewed and stable and Patient moving all extremities X 4  Post vital signs: unstable low BP  Nacl bolus, Albumin 5% Iv  And neosynephrine drip started  Complications: No apparent anesthesia complications

## 2013-01-04 NOTE — Progress Notes (Addendum)
[facility] PROGRESS NOTE CONSULT  [name] WUJ:[id number] DOB: [birth year] DOA: 12/22/2012 PCP: Kimber Relic, MD  Assessment/Plan:  Hospital-acquired pneumonia  -Continue vancomycin and aztreonam.  -Blood with no growth to date and sputum cultures with rare GPC- follow. -Strep pneumonia Antigen neg. -wbc improving, will plan to deescalate abx if continues to do well after surgery today 10/30  Leukocytosis  -secondary to pneumonia.  -continue abx as above, follow and recheck -CT abdomen without evidence for surgical complications.  Protein caloric malnutrition  -on TNA per surgery  Wide-Complex Tachycardia -As per cardiology. -No further recurrence.  Femoral hernia  - per surgery>> back to OR today 10/30.   Code Status: Full Code Disposition Plan: per surgery   Antibiotics:  Vancomycin 12/31/2012-->  Aztreonam 12/31/2012-->   Subjective: Taken back to OR  Today and still there on follow up.  Objective: Filed Vitals:   01/04/13 0004 01/04/13 0417 01/04/13 1258 01/04/13 1500  BP: 132/58 145/60 135/64 148/55  Pulse: 69 64 65 58  Temp:  98.7 F (37.1 C)  97.7 F (36.5 C)  TempSrc:  Axillary  Oral  Resp:  18  18  Height:      Weight:      SpO2:  92%  92%    Intake/Output Summary (Last 24 hours) at 01/04/13 1739 Last data filed at 01/04/13 1500  Gross per 24 hour  Intake   1998 ml  Output   1355 ml  Net    643 ml   Filed Weights   12/22/12 1632 12/26/12 1656  Weight: 63.9 kg (140 lb 14 oz) 62.8 kg (138 lb 7.2 oz)     Data Reviewed: Basic Metabolic Panel:  Recent Labs Lab 12/31/12 0500 01/01/13 0614 01/02/13 0455 01/03/13 0625 01/04/13 0520  NA 140 139 132* 133* 133*  K 3.8 3.4* 3.6 3.4* 4.1  CL 108 108 100 103 101  CO2 22 22 23 23 22   GLUCOSE 120* 143* 117* 116* 99  BUN 23 17 14 18 20   CREATININE 1.00 0.65 0.57 0.59 0.62  CALCIUM 8.9 8.5 8.1* 8.2* 8.6  MG 1.7 1.6 1.8 1.8 1.9  PHOS 3.7 2.0* 2.9 4.2 4.2   Liver Function  Tests:  Recent Labs Lab 12/31/12 0500 01/01/13 0614 01/04/13 0520  AST 17 16 26   ALT 21 17 22   ALKPHOS 69 74 88  BILITOT 0.5 0.3 0.3  PROT 5.9* 5.2* 5.7*  ALBUMIN 2.8* 2.3* 2.2*   No results found for this basename: LIPASE, AMYLASE,  in the last 168 hours No results found for this basename: AMMONIA,  in the last 168 hours CBC:  Recent Labs Lab 12/31/12 0500 01/01/13 0614 01/02/13 0455 01/04/13 0520  WBC 23.6* 24.2* 21.1* 13.6*  NEUTROABS 20.5* 21.0*  --   --   HGB 14.9 12.5 12.6 12.2  HCT 43.0 36.9 36.4 35.6*  MCV 88.1 88.7 86.9 87.7  PLT 173 148* 165 217   Cardiac Enzymes: No results found for this basename: CKTOTAL, CKMB, CKMBINDEX, TROPONINI,  in the last 168 hours BNP (last 3 results) No results found for this basename: PROBNP,  in the last 8760 hours CBG:  Recent Labs Lab 01/03/13 0003 01/03/13 0739 01/03/13 1544 01/04/13 0004 01/04/13 0746  GLUCAP 127* 119* 113* 101* 115*    Recent Results (from the past 240 hour(s))  CULTURE, BLOOD (ROUTINE X 2)     Status: None   Collection Time    12/31/12 12:00 PM      Result Value  Range Status   Specimen Description BLOOD RIGHT ARM   Final   Special Requests BOTTLES DRAWN AEROBIC AND ANAEROBIC 2 CC EA   Final   Culture  Setup Time     Final   Value: 12/31/2012 17:46     Performed at [facility]   Culture     Final   Value:        BLOOD CULTURE RECEIVED NO GROWTH TO DATE CULTURE WILL BE HELD FOR 5 DAYS BEFORE ISSUING A FINAL NEGATIVE REPORT     Performed at [facility]   Report Status PENDING   Incomplete  CULTURE, BLOOD (ROUTINE X 2)     Status: None   Collection Time    12/31/12 12:15 PM      Result Value Range Status   Specimen Description BLOOD LEFT HAND   Final   Special Requests BOTTLES DRAWN AEROBIC AND ANAEROBIC 2 CC EA   Final   Culture  Setup Time     Final   Value: 12/31/2012 17:46     Performed at [facility]   Culture     Final   Value:        BLOOD CULTURE  RECEIVED NO GROWTH TO DATE CULTURE WILL BE HELD FOR 5 DAYS BEFORE ISSUING A FINAL NEGATIVE REPORT     Performed at [facility]   Report Status PENDING   Incomplete  CULTURE, EXPECTORATED SPUTUM-ASSESSMENT     Status: None   Collection Time    01/01/13  5:52 AM      Result Value Range Status   Specimen Description SPUTUM   Final   Special Requests NONE   Final   Sputum evaluation     Final   Value: THIS SPECIMEN IS ACCEPTABLE. RESPIRATORY CULTURE REPORT TO FOLLOW.   Report Status 01/01/2013 FINAL   Final  CULTURE, RESPIRATORY (NON-EXPECTORATED)     Status: None   Collection Time    01/01/13  5:52 AM      Result Value Range Status   Specimen Description SPUTUM   Final   Special Requests NONE   Final   Gram Stain     Final   Value: ABUNDANT WBC PRESENT, PREDOMINANTLY PMN     RARE SQUAMOUS EPITHELIAL CELLS PRESENT     RARE GRAM POSITIVE COCCI     IN PAIRS RARE GRAM NEGATIVE RODS     Performed at [facility]   Culture     Final   Value: NORMAL OROPHARYNGEAL FLORA     Performed at [facility]   Report Status 01/03/2013 FINAL   Final     Studies: Dg Abd 2 Views  01/03/2013   CLINICAL DATA:  History of incarcerated hernia, postop. Question small bowel obstruction.  EXAM: ABDOMEN - 2 VIEW  COMPARISON:  CT 01/01/2013. Plain films 05/03/2012.  FINDINGS: Bowel-gas pattern is stable or slightly worsened with dilated small bowel loops data the abdomen and pelvis. Multiple air-fluid levels. Colon is decompressed. There is contrast material noted within the colon. Cannot exclude small bowel obstruction.  No free air. No organomegaly. Bibasilar atelectasis.  IMPRESSION: Stable or slightly worsened small bowel dilatation. Findings suspicious for partial small bowel obstruction.   Electronically Signed   By: Charlett Nose M.D.   On: 01/03/2013 11:15    Scheduled Meds: . aztreonam  1 g Intravenous Q8H  . digoxin  0.125 mg Intravenous Daily  . famotidine (PEPCID) IV  20 mg  Intravenous Q12H  .  insulin aspart  0-9 Units Subcutaneous Q8H  . levothyroxine  50 mcg Intravenous Daily  . metoprolol  5 mg Intravenous Q6H  . sodium chloride  10-40 mL Intracatheter Q12H  . vancomycin  1,000 mg Intravenous Q12H   Continuous Infusions: . 0.9 % NaCl with KCl 20 mEq / L 20 mL/hr at 01/03/13 0308  . Marland KitchenTPN (CLINIMIX-E) Adult 75 mL/hr (01/04/13 1600)   And  . fat emulsion 250 mL (01/03/13 1805)  . Marland KitchenTPN (CLINIMIX-E) Adult     And  . fat emulsion      Principal Problem:   Femoral hernia with gangrene and obstruction Active Problems:   Unspecified intestinal obstruction   Malnutrition of moderate degree   Moderate malnutrition   HCAP (healthcare-associated pneumonia)   Leukocytosis, unspecified    Time spent: 25 minutes    Adien Kimmel C  [facility] Pager [phone]  If 7PM-7AM, please contact night-coverage at [email or web address], password [facility] 01/04/2013, 5:39 PM  LOS: 13 days

## 2013-01-04 NOTE — Anesthesia Postprocedure Evaluation (Signed)
  Anesthesia Post-op Note  Patient: [name]  Procedure(s) Performed: Procedure(s) (LRB): Diagnostic Laparoscopy, exploratory laparotomy with small bowel resection, closure of right femoral hernia repair (N/A)  Patient Location: PACU  Anesthesia Type: General  Level of Consciousness: awake and alert   Airway and Oxygen Therapy: Patient Spontanous Breathing  Post-op Pain: mild  Post-op Assessment: Post-op Vital signs reviewed, Patient's Cardiovascular Status Stable, Respiratory Function Stable, Patent Airway and No signs of Nausea or vomiting  Last Vitals:  Filed Vitals:   01/04/13 2033  BP:   Pulse:   Temp: 36.5 C  Resp:     Post-op Vital Signs: stable   Complications: No apparent anesthesia complications

## 2013-01-04 NOTE — Progress Notes (Signed)
Subjective:  No chest pain or dyspnea. No palpitations. Still not able to take orally reliably.  Objective:  Vital Signs in the last 24 hours: Temp:  [98.2 F (36.8 C)-98.7 F (37.1 C)] 98.7 F (37.1 C) (10/30 0417) Pulse Rate:  [63-76] 64 (10/30 0417) Resp:  [18] 18 (10/30 0417) BP: (132-169)/(53-75) 145/60 mmHg (10/30 0417) SpO2:  [92 %-93 %] 92 % (10/30 0417)  Intake/Output from previous day: 10/29 0701 - 10/30 0700 In: 2088 [I.V.:160; IV Piggyback:600; TPN:1328] Out: 1800 [Urine:1775; Drains:25] Intake/Output from this shift:    . aztreonam  1 g Intravenous Q8H  . digoxin  0.125 mg Intravenous Daily  . enoxaparin (LOVENOX) injection  40 mg Subcutaneous Q24H  . famotidine (PEPCID) IV  20 mg Intravenous Q12H  . insulin aspart  0-9 Units Subcutaneous Q8H  . levothyroxine  50 mcg Intravenous Daily  . metoprolol  5 mg Intravenous Q6H  . sodium chloride  10-40 mL Intracatheter Q12H  . vancomycin  1,000 mg Intravenous Q12H   . 0.9 % NaCl with KCl 20 mEq / L 20 mL/hr at 01/03/13 0308  . Marland KitchenTPN (CLINIMIX-E) Adult 75 mL/hr at 01/03/13 1804   And  . fat emulsion 250 mL (01/03/13 1805)    Physical Exam: The patient appears to be in no distress.  Head and neck exam reveals that the pupils are equal and reactive.  The extraocular movements are full.  There is no scleral icterus.  Mouth and pharynx are benign.  No lymphadenopathy.  No carotid bruits.  The jugular venous pressure is normal.  Thyroid is not enlarged or tender.  Chest is clear to percussion and auscultation.  No rales or rhonchi.  Expansion of the chest is symmetrical.  Heart reveals no abnormal lift or heave.  First and second heart sounds are normal.  There is no  gallop rub or click. Grade 1/6 systolic murmur at base.  The abdomen is distended. Bowel sounds are very soft to absent this am  Extremities reveal no phlebitis or edema.  Pedal pulses are good.  There is no cyanosis or clubbing.  Neurologic exam  is normal strength and no lateralizing weakness.  No sensory deficits.  Integument reveals no rash  Lab Results:  Recent Labs  01/02/13 0455 01/04/13 0520  WBC 21.1* 13.6*  HGB 12.6 12.2  PLT 165 217    Recent Labs  01/03/13 0625 01/04/13 0520  NA 133* 133*  K 3.4* 4.1  CL 103 101  CO2 23 22  GLUCOSE 116* 99  BUN 18 20  CREATININE 0.59 0.62   No results found for this basename: TROPONINI, CK, MB,  in the last 72 hours Hepatic Function Panel  Recent Labs  01/04/13 0520  PROT 5.7*  ALBUMIN 2.2*  AST 26  ALT 22  ALKPHOS 88  BILITOT 0.3   No results found for this basename: CHOL,  in the last 72 hours No results found for this basename: PROTIME,  in the last 72 hours  Imaging: Dg Abd 2 Views  01/03/2013   CLINICAL DATA:  History of incarcerated hernia, postop. Question small bowel obstruction.  EXAM: ABDOMEN - 2 VIEW  COMPARISON:  CT 01/01/2013. Plain films 05/03/2012.  FINDINGS: Bowel-gas pattern is stable or slightly worsened with dilated small bowel loops data the abdomen and pelvis. Multiple air-fluid levels. Colon is decompressed. There is contrast material noted within the colon. Cannot exclude small bowel obstruction.  No free air. No organomegaly. Bibasilar atelectasis.  IMPRESSION:  Stable or slightly worsened small bowel dilatation. Findings suspicious for partial small bowel obstruction.   Electronically Signed   By: Charlett Nose M.D.   On: 01/03/2013 11:15    Cardiac Studies: Telemetry shows biventricular paced rhythm . Occasional PVCs. Assessment/Plan:  1. Nonischemic cardiomyopathy. Appears euvolemic now. 2. NSVT improved since pacemaker adjusted.  3. Post-op ileus. 4. Abnormal chest xray ? Multifocal pneumonia.  Plan: Continue current cardiac meds.   LOS: 13 days    Cassell Clement 01/04/2013, 7:51 AM

## 2013-01-04 NOTE — Progress Notes (Signed)
CSW following for discharge planning - SNF vs. possible LTACH when medically ready. CSW has completed FL2 & will continue to follow and assist with discharge.    Unice Bailey, LCSW [facility] Clinical Social Worker cell #: [phone]

## 2013-01-04 NOTE — Progress Notes (Signed)
10 Days Post-Op  Subjective: Worse abdominal pain and distension, no nausea or vomiting  Objective: Vital signs in last 24 hours: Temp:  [98.2 F (36.8 C)-98.7 F (37.1 C)] 98.7 F (37.1 C) (10/30 0417) Pulse Rate:  [63-69] 64 (10/30 0417) Resp:  [18] 18 (10/30 0417) BP: (132-169)/(53-75) 145/60 mmHg (10/30 0417) SpO2:  [92 %-93 %] 92 % (10/30 0417) Last BM Date: 19-Jan-2013  Intake/Output from previous day: Jan 20, 2023 0701 - 10/30 0700 In: 2962 [I.V.:320; IV Piggyback:650; TPN:1992] Out: 1800 [Urine:1775; Drains:25] Intake/Output this shift: Total I/O In: -  Out: 200 [Urine:200]  General appearance: cooperative and no distress GI: soft, mild tenderness, moderate distension,  groin incision looks fine, JP serous, no peritoneal signs  Lab Results:   Recent Labs  01/02/13 0455 01/04/13 0520  WBC 21.1* 13.6*  HGB 12.6 12.2  HCT 36.4 35.6*  PLT 165 217   BMET  Recent Labs  2013/01/19 0625 01/04/13 0520  NA 133* 133*  K 3.4* 4.1  CL 103 101  CO2 23 22  GLUCOSE 116* 99  BUN 18 20  CREATININE 0.59 0.62  CALCIUM 8.2* 8.6   PT/INR No results found for this basename: LABPROT, INR,  in the last 72 hours ABG No results found for this basename: PHART, PCO2, PO2, HCO3,  in the last 72 hours  Studies/Results: Dg Abd 2 Views  01-19-13   CLINICAL DATA:  History of incarcerated hernia, postop. Question small bowel obstruction.  EXAM: ABDOMEN - 2 VIEW  COMPARISON:  CT 01/01/2013. Plain films 05/03/2012.  FINDINGS: Bowel-gas pattern is stable or slightly worsened with dilated small bowel loops data the abdomen and pelvis. Multiple air-fluid levels. Colon is decompressed. There is contrast material noted within the colon. Cannot exclude small bowel obstruction.  No free air. No organomegaly. Bibasilar atelectasis.  IMPRESSION: Stable or slightly worsened small bowel dilatation. Findings suspicious for partial small bowel obstruction.   Electronically Signed   By: Charlett Nose M.D.    On: 19-Jan-2013 11:15    Anti-infectives: Anti-infectives   Start     Dose/Rate Route Frequency Ordered Stop   01/02/13 2000  vancomycin (VANCOCIN) IVPB 1000 mg/200 mL premix     1,000 mg 200 mL/hr over 60 Minutes Intravenous Every 12 hours 01/02/13 1254 01/08/13 0759   01/01/13 0000  vancomycin (VANCOCIN) 500 mg in sodium chloride 0.9 % 100 mL IVPB  Status:  Discontinued     500 mg 100 mL/hr over 60 Minutes Intravenous Every 12 hours 12/31/12 1133 01/02/13 1254   12/31/12 1200  aztreonam (AZACTAM) 1 g in dextrose 5 % 50 mL IVPB     1 g 100 mL/hr over 30 Minutes Intravenous Every 8 hours 12/31/12 1120 01/08/13 1159   12/31/12 1200  vancomycin (VANCOCIN) IVPB 1000 mg/200 mL premix     1,000 mg 200 mL/hr over 60 Minutes Intravenous  Once 12/31/12 1133 12/31/12 1413   12/26/12 0800  ertapenem (INVANZ) 1 g in sodium chloride 0.9 % 50 mL IVPB     1 g 100 mL/hr over 30 Minutes Intravenous Every 24 hours 12/25/12 1529 12/26/12 0820   12/25/12 1100  [MAR Hold]  ertapenem (INVANZ) 1 g in sodium chloride 0.9 % 50 mL IVPB     (On MAR Hold since 12/25/12 1112)   1 g 100 mL/hr over 30 Minutes Intravenous On call to O.R. 12/25/12 1045 12/25/12 1145   12/25/12 0800  cefOXitin (MEFOXIN) 2 g in dextrose 5 % 50 mL IVPB  Status:  Discontinued  2 g 100 mL/hr over 30 Minutes Intravenous On call to O.R. 12/25/12 0750 12/25/12 1544      Assessment/Plan: s/p Procedure(s): explor right groin, small bowel rescection, tissue repair right femoral hernia (Right) she is justifiably frustrated.  abdominal pain increased and distension imcreased.  I am not sure why she will not open up but she seems to have failed NG tube twice and so I have recommended diagnostic laparoscopy and possible laparotomy to evaluate for possible bowel obstruction.  risks of the procedure discussed with her and she is not sure if she wants to have another surgery which I can understand.  I will plan for surgery today unless she does  not want to proceed with another surgery.  She will think about her options and let us know what she would like.  LOS: 13 days    Lodema Pilot DAVID 01/04/2013

## 2013-01-04 NOTE — Progress Notes (Deleted)
Patient ID: [name], female   DOB: 01-Jan-1933, 79 y.o.   MRN: [id number] 10 Days Post-Op  Subjective: Pt doesn't feel as well today.  Increase in abdominal pain and distention.  Patient is very frustrated and unsure that she will tolerate another operation.  Objective: Vital signs in last 24 hours: Temp:  [98.2 F (36.8 C)-98.7 F (37.1 C)] 98.7 F (37.1 C) (10/30 0417) Pulse Rate:  [63-69] 64 (10/30 0417) Resp:  [18] 18 (10/30 0417) BP: (132-169)/(53-75) 145/60 mmHg (10/30 0417) SpO2:  [92 %-93 %] 92 % (10/30 0417) Last BM Date: 15-Jan-2013  Intake/Output from previous day: 01-16-2023 0701 - 10/30 0700 In: 2962 [I.V.:320; IV Piggyback:650; TPN:1992] Out: 1800 [Urine:1775; Drains:25] Intake/Output this shift: Total I/O In: -  Out: 200 [Urine:200]  PE: Abd: soft, but increase distention and tenderness,  RLQ incision is c/d/i with JP with serous output  Lab Results:   Recent Labs  01/02/13 0455 01/04/13 0520  WBC 21.1* 13.6*  HGB 12.6 12.2  HCT 36.4 35.6*  PLT 165 217   BMET  Recent Labs  01-15-13 0625 01/04/13 0520  NA 133* 133*  K 3.4* 4.1  CL 103 101  CO2 23 22  GLUCOSE 116* 99  BUN 18 20  CREATININE 0.59 0.62  CALCIUM 8.2* 8.6   PT/INR No results found for this basename: LABPROT, INR,  in the last 72 hours CMP     Component Value Date/Time   NA 133* 01/04/2013 0520   NA 136 12/07/2012 1422   K 4.1 01/04/2013 0520   CL 101 01/04/2013 0520   CO2 22 01/04/2013 0520   GLUCOSE 99 01/04/2013 0520   GLUCOSE 90 12/07/2012 1422   BUN 20 01/04/2013 0520   BUN 17 12/07/2012 1422   CREATININE 0.62 01/04/2013 0520   CALCIUM 8.6 01/04/2013 0520   PROT 5.7* 01/04/2013 0520   PROT 5.8* 12/07/2012 1422   ALBUMIN 2.2* 01/04/2013 0520   AST 26 01/04/2013 0520   ALT 22 01/04/2013 0520   ALKPHOS 88 01/04/2013 0520   BILITOT 0.3 01/04/2013 0520   GFRNONAA 84* 01/04/2013 0520   GFRAA >90 01/04/2013 0520   Lipase     Component Value Date/Time   LIPASE 15  12/20/2012 2320       Studies/Results: Dg Abd 2 Views  15-Jan-2013   CLINICAL DATA:  History of incarcerated hernia, postop. Question small bowel obstruction.  EXAM: ABDOMEN - 2 VIEW  COMPARISON:  CT 01/01/2013. Plain films 05/03/2012.  FINDINGS: Bowel-gas pattern is stable or slightly worsened with dilated small bowel loops data the abdomen and pelvis. Multiple air-fluid levels. Colon is decompressed. There is contrast material noted within the colon. Cannot exclude small bowel obstruction.  No free air. No organomegaly. Bibasilar atelectasis.  IMPRESSION: Stable or slightly worsened small bowel dilatation. Findings suspicious for partial small bowel obstruction.   Electronically Signed   By: Charlett Nose M.D.   On: 2013-01-15 11:15    Anti-infectives: Anti-infectives   Start     Dose/Rate Route Frequency Ordered Stop   01/02/13 2000  vancomycin (VANCOCIN) IVPB 1000 mg/200 mL premix     1,000 mg 200 mL/hr over 60 Minutes Intravenous Every 12 hours 01/02/13 1254 01/08/13 0759   01/01/13 0000  vancomycin (VANCOCIN) 500 mg in sodium chloride 0.9 % 100 mL IVPB  Status:  Discontinued     500 mg 100 mL/hr over 60 Minutes Intravenous Every 12 hours 12/31/12 1133 01/02/13 1254   12/31/12 1200  aztreonam (AZACTAM) 1  g in dextrose 5 % 50 mL IVPB     1 g 100 mL/hr over 30 Minutes Intravenous Every 8 hours 12/31/12 1120 01/08/13 1159   12/31/12 1200  vancomycin (VANCOCIN) IVPB 1000 mg/200 mL premix     1,000 mg 200 mL/hr over 60 Minutes Intravenous  Once 12/31/12 1133 12/31/12 1413   12/26/12 0800  ertapenem (INVANZ) 1 g in sodium chloride 0.9 % 50 mL IVPB     1 g 100 mL/hr over 30 Minutes Intravenous Every 24 hours 12/25/12 1529 12/26/12 0820   12/25/12 1100  [MAR Hold]  ertapenem (INVANZ) 1 g in sodium chloride 0.9 % 50 mL IVPB     (On MAR Hold since 12/25/12 1112)   1 g 100 mL/hr over 30 Minutes Intravenous On call to O.R. 12/25/12 1045 12/25/12 1145   12/25/12 0800  cefOXitin (MEFOXIN) 2 g  in dextrose 5 % 50 mL IVPB  Status:  Discontinued     2 g 100 mL/hr over 30 Minutes Intravenous On call to O.R. 12/25/12 0750 12/25/12 1544       Assessment/Plan  1. S/p repair of incarcerated femoral hernia with SBR 2. PSBO 3. PCM/TNA Patient Active Problem List   Diagnosis Date Noted  . HCAP (healthcare-associated pneumonia) 12/31/2012  . Leukocytosis, unspecified 12/31/2012  . Femoral hernia with gangrene and obstruction 12/25/2012  . Malnutrition of moderate degree 12/24/2012  . Moderate malnutrition 12/24/2012  . Unspecified intestinal obstruction 12/23/2012  . Nausea with vomiting 12/22/2012  . Abdominal  pain, other specified site 12/22/2012  . Hypothyroidism   . Hair thinning   . Anxiety disorder   . Other primary cardiomyopathies   . Unspecified sinusitis (chronic)   . Unspecified chronic bronchitis   . Depressive disorder, not elsewhere classified   . Restless legs syndrome (RLS)   . Chronic airway obstruction, not elsewhere classified   . PVC (premature ventricular contraction) 06/08/2011  . ANXIETY 02/03/2009  . GERD 02/03/2009  . CHRONIC SYSTOLIC HEART FAILURE 12/12/2008  . ICD-[employer] 12/10/2008  . BARRETT'S ESOPHAGUS 05/29/2008  . GASTRITIS 05/29/2008  . OTHER SPECIFIED DISORDER OF STOMACH AND DUODENUM 04/29/2008  . SLEEP APNEA 04/08/2008  . ABDOMINAL PAIN, EPIGASTRIC 04/08/2008  . NONSPECIFIC ABN FINDING RAD & OTH EXAM GI TRACT 04/08/2008   Plan: 1. Patient was given the option to pursue surgical intervention for persistent bowel obstruction.  She is unsure what she wants to do.  She is frustrated understandably.  I have informed her that this is not the norm, but sometimes things like this happen.  She would like to talk to her friend to determine if she wants to pursue said operation. 2. Cont NPO 3. Appreciate medicine and cardiology evaluation.  4. Cont TNA.  LOS: 13 days    [name] 01/04/2013, 11:51 AM Pager: [phone]

## 2013-01-04 NOTE — Progress Notes (Addendum)
PARENTERAL NUTRITION CONSULT NOTE   Pharmacy Consult for TNA Indication: Ileus  Allergies  Allergen Reactions  . Hydrocodone Itching  . Cephalexin Itching and Swelling  . Doxycycline Other (See Comments)    Per pt: unknown  . Ofloxacin Other (See Comments)    Per pt: unknown  . Tape Other (See Comments)    Burns skin.   . Latex Rash    Patient Measurements: Height: 5\' 8"  (172.7 cm) Weight: 138 lb 7.2 oz (62.8 kg) IBW/kg (Calculated) : 63.9  Vital Signs: Temp: 98.7 F (37.1 C) (10/30 0417) Temp src: Axillary (10/30 0417) BP: 145/60 mmHg (10/30 0417) Pulse Rate: 64 (10/30 0417) Intake/Output from previous day: 10/29 0701 - 10/30 0700 In: 2962 [I.V.:320; IV Piggyback:650; TPN:1992] Out: 1800 [Urine:1775; Drains:25] Intake/Output from this shift:    Labs:  Recent Labs  01/02/13 0455 01/04/13 0520  WBC 21.1* 13.6*  HGB 12.6 12.2  HCT 36.4 35.6*  PLT 165 217     Recent Labs  01/02/13 0455 01/03/13 0625 01/04/13 0520  NA 132* 133* 133*  K 3.6 3.4* 4.1  CL 100 103 101  CO2 23 23 22   GLUCOSE 117* 116* 99  BUN 14 18 20   CREATININE 0.57 0.59 0.62  CALCIUM 8.1* 8.2* 8.6  MG 1.8 1.8 1.9  PHOS 2.9 4.2 4.2  PROT  --   --  5.7*  ALBUMIN  --   --  2.2*  AST  --   --  26  ALT  --   --  22  ALKPHOS  --   --  88  BILITOT  --   --  0.3   Estimated Creatinine Clearance: 57.5 ml/min (by C-G formula based on Cr of 0.62).    Recent Labs  01/03/13 1544 01/04/13 0004 01/04/13 0746  GLUCAP 113* 101* 115*    Medical History: Past Medical History  Diagnosis Date  . Nonischemic cardiomyopathy     EF initially 20%; Last measurement up to 50% per echo in August of 2012  . CHF (congestive heart failure)   . LBBB (left bundle branch block)   . Mitral regurgitation   . Melanoma     right arm  . Hypothyroidism   . Erosive gastritis 08/2004  . Hyperplastic polyps of stomach 06/2002  . Barrett's esophagus 04/2008  . GERD (gastroesophageal reflux disease)   .  Diverticulitis   . Anxiety disorder   . HTN (hypertension)   . ICD (implantable cardiac defibrillator) in place     BiV/ICD; s/p removal of ICD with insertion of BiV PPM 04/26/12  . Hair thinning   . Unspecified sinusitis (chronic)   . Unspecified chronic bronchitis   . Depressive disorder, not elsewhere classified   . Restless legs syndrome (RLS)   . Chronic airway obstruction, not elsewhere classified   . Benign neoplasm of colon   . Other and unspecified hyperlipidemia   . Unspecified nasal polyp   . Pain in joint, lower leg   . Synovial cyst of popliteal space   . Complication of anesthesia     slow to wake up  . Pacemaker 04/26/2012  . Sleep apnea   . Other and unspecified hyperlipidemia   . Dysthymic disorder   . Palpitations     Medications:  Scheduled:  . aztreonam  1 g Intravenous Q8H  . digoxin  0.125 mg Intravenous Daily  . enoxaparin (LOVENOX) injection  40 mg Subcutaneous Q24H  . famotidine (PEPCID) IV  20 mg Intravenous Q12H  .  insulin aspart  0-9 Units Subcutaneous Q8H  . levothyroxine  50 mcg Intravenous Daily  . metoprolol  5 mg Intravenous Q6H  . sodium chloride  10-40 mL Intracatheter Q12H  . vancomycin  1,000 mg Intravenous Q12H   Infusions:  . 0.9 % NaCl with KCl 20 mEq / L 20 mL/hr at 01/03/13 0308  . Marland KitchenTPN (CLINIMIX-E) Adult 75 mL/hr at 01/03/13 1804   And  . fat emulsion 250 mL (01/03/13 1805)    Current Nutrition:  NPO with NG tube  Nutritional Goals:  Per RD 10/24:  Kcal: 1650-1900  Protein: 75-90 g  Fluid: >1.7 L  Clinimix E 5/15 at goal rate of 75 ml/hr + 20% Lipids at 58ml/hr will provide 90g protein, 1656 kcal per day  NS + KCl at [facility]  Assessment: 79 yo female s/p small bowel resection and tissue repair of right femoral hernia on 10/20. Diet advanced to clears on 10/24 but had significant abdominal pain, nausea and vomiting, 10/26 abd film revealed dilated small bowel loops suspicious for SBO. Patient NPO, order to place PICC  and begin TNA on 10/25. Initially (Per IV team), patient refused PICC placement on 10/25, but was agreeable to placement 10/26  10/30: Day #5 TPN, NGT fell out 10/28, having BMs but on PE 10/29 abd more distended with Xrays revealing stable or slightly worsening dilatation of bowel c/w SBO.  CCS suggesting possible ex lap.   Glucose: CBGs at goal of < 150, 0 unit SSI insulin required/24h Electrolytes: Na = 133 (suspect from TPN, unable to adjust in premixed TPN product), K = 4.1, Phos = 4.2, Mg = 1.9, Corr Ca WNL Renal function: wnl, UOP adequate Hepatic function: wnl  TG: 89 (10/27) Prealb: low, 8.6 (10/23), 5.1 (10/27)  Plan:   Continue Clinimix E 5/15 at goal 75 ml/hr as tolerating TPN   20% fat emulsion at 8 ml/hr (to keep fat calories to <30% total kcals).  TNA to contain standard multivitamins and trace elements.  Continue CBG/SSI q8h .   TNA lab panels on Mondays & Thursdays.  Juliette Alcide, PharmD, BCPS.   Pager: [phone] 01/04/2013,8:09 AM

## 2013-01-05 ENCOUNTER — Encounter (HOSPITAL_COMMUNITY): Payer: Self-pay | Admitting: General Surgery

## 2013-01-05 DIAGNOSIS — K4140 Unilateral femoral hernia, with gangrene, not specified as recurrent: Secondary | ICD-10-CM

## 2013-01-05 DIAGNOSIS — I959 Hypotension, unspecified: Secondary | ICD-10-CM

## 2013-01-05 LAB — GLUCOSE, CAPILLARY: Glucose-Capillary: 171 mg/dL — ABNORMAL HIGH (ref 70–99)

## 2013-01-05 MED ORDER — FAT EMULSION 20 % IV EMUL
250.0000 mL | INTRAVENOUS | Status: AC
  Administered 2013-01-05: 250 mL via INTRAVENOUS
  Filled 2013-01-05: qty 250

## 2013-01-05 MED ORDER — TRACE MINERALS CR-CU-F-FE-I-MN-MO-SE-ZN IV SOLN
INTRAVENOUS | Status: AC
  Administered 2013-01-05: 18:00:00 via INTRAVENOUS
  Filled 2013-01-05: qty 2000

## 2013-01-05 MED ORDER — METOPROLOL TARTRATE 1 MG/ML IV SOLN
2.5000 mg | Freq: Four times a day (QID) | INTRAVENOUS | Status: DC
  Administered 2013-01-06 – 2013-01-08 (×10): 2.5 mg via INTRAVENOUS
  Filled 2013-01-05 (×14): qty 5

## 2013-01-05 NOTE — Progress Notes (Signed)
Subjective:  Patient is alert following surgery yesterday. No chest pain or dyspnea. BP soft and still on phenylephrine drip. Rhythm stable biventricular pacing.  Objective:  Vital Signs in the last 24 hours: Temp:  [97.7 F (36.5 C)-100.5 F (38.1 C)] 98.5 F (36.9 C) (10/31 0400) Pulse Rate:  [58-97] 69 (10/31 0715) Resp:  [10-22] 17 (10/31 0715) BP: (70-148)/(24-73) 90/47 mmHg (10/31 0715) SpO2:  [85 %-97 %] 96 % (10/31 0715) FiO2 (%):  [40 %-50 %] 40 % (10/31 0300) Weight:  [137 lb 5.6 oz (62.3 kg)] 137 lb 5.6 oz (62.3 kg) (10/30 2033)  Intake/Output from previous day: 10/30 0701 - 10/31 0700 In: 3540.5 [I.V.:2157.5; IV Piggyback:350; TPN:913] Out: 2850 [Urine:1430; Emesis/NG output:150; Drains:20; Blood:150] Intake/Output from this shift:    . aztreonam  1 g Intravenous Q8H  . digoxin  0.125 mg Intravenous Daily  . famotidine (PEPCID) IV  20 mg Intravenous Q12H  . insulin aspart  0-9 Units Subcutaneous Q8H  . levothyroxine  50 mcg Intravenous Daily  . metoprolol  5 mg Intravenous Q6H  . morphine   Intravenous Q4H  . sodium chloride  10-40 mL Intracatheter Q12H  . vancomycin  1,000 mg Intravenous Q12H   . Marland KitchenTPN (CLINIMIX-E) Adult 75 mL/hr at 01/04/13 2001   And  . fat emulsion 250 mL (01/04/13 2002)  . phenylephrine (NEO-SYNEPHRINE) Adult infusion 35 mcg/min (01/05/13 0600)    Physical Exam: The patient appears to be in no distress.  Head and neck exam reveals that the pupils are equal and reactive.  The extraocular movements are full.  There is no scleral icterus.  Mouth and pharynx are benign.  No lymphadenopathy.  No carotid bruits.  The jugular venous pressure is normal.  Thyroid is not enlarged or tender.  Chest is clear to percussion and auscultation.  No rales or rhonchi.  Expansion of the chest is symmetrical.  Heart reveals no abnormal lift or heave.  First and second heart sounds are normal.  There is no  gallop rub or click. Grade 1/6 systolic  murmur at base.  The abdomen reveals absent bowel sounds. NG tub in place.  Extremities reveal no phlebitis or edema.  Pedal pulses are good.  There is no cyanosis or clubbing.  Neurologic exam is normal strength and no lateralizing weakness.  No sensory deficits.  Integument reveals no rash  Lab Results:  Recent Labs  01/04/13 0520  WBC 13.6*  HGB 12.2  PLT 217    Recent Labs  01/03/13 0625 01/04/13 0520  NA 133* 133*  K 3.4* 4.1  CL 103 101  CO2 23 22  GLUCOSE 116* 99  BUN 18 20  CREATININE 0.59 0.62   No results found for this basename: TROPONINI, CK, MB,  in the last 72 hours Hepatic Function Panel  Recent Labs  01/04/13 0520  PROT 5.7*  ALBUMIN 2.2*  AST 26  ALT 22  ALKPHOS 88  BILITOT 0.3   No results found for this basename: CHOL,  in the last 72 hours No results found for this basename: PROTIME,  in the last 72 hours  Imaging: Dg Abd 2 Views  01/03/2013   CLINICAL DATA:  History of incarcerated hernia, postop. Question small bowel obstruction.  EXAM: ABDOMEN - 2 VIEW  COMPARISON:  CT 01/01/2013. Plain films 05/03/2012.  FINDINGS: Bowel-gas pattern is stable or slightly worsened with dilated small bowel loops data the abdomen and pelvis. Multiple air-fluid levels. Colon is decompressed. There is contrast  material noted within the colon. Cannot exclude small bowel obstruction.  No free air. No organomegaly. Bibasilar atelectasis.  IMPRESSION: Stable or slightly worsened small bowel dilatation. Findings suspicious for partial small bowel obstruction.   Electronically Signed   By: Charlett Nose M.D.   On: 01/03/2013 11:15    Cardiac Studies: Telemetry shows biventricular paced rhythm . Occasional PVCs. Assessment/Plan:  1. Nonischemic cardiomyopathy. Appears euvolemic now. 2. NSVT improved since pacemaker adjusted.  3. Post-op ileus.   Plan: Wean phenylephrine as BP improves. Will reduce IV lopressor dose and maintain parameters.   LOS: 14 days     Cassell Clement 01/05/2013, 7:30 AM

## 2013-01-05 NOTE — Progress Notes (Signed)
Patient ID: [name], female   DOB: 23-Jan-1933, 79 y.o.   MRN: [id number] 1 Day Post-Op  Subjective: Had a long conversation with the patient this morning to discuss what we found during the operation.  Understandably she is upset and wonders if she is ever going to get better.  She is having pain this morning, but no other major complaints  Objective: Vital signs in last 24 hours: Temp:  [97.7 F (36.5 C)-100.5 F (38.1 C)] 98.5 F (36.9 C) (10/31 0400) Pulse Rate:  [58-97] 69 (10/31 0715) Resp:  [10-22] 17 (10/31 0715) BP: (70-148)/(24-73) 90/47 mmHg (10/31 0715) SpO2:  [85 %-97 %] 96 % (10/31 0715) FiO2 (%):  [40 %-50 %] 40 % (10/31 0300) Weight:  [137 lb 5.6 oz (62.3 kg)] 137 lb 5.6 oz (62.3 kg) (10/30 2033) Last BM Date: 01-14-13  Intake/Output from previous day: 10/30 0701 - 10/31 0700 In: 3540.5 [I.V.:2157.5; IV Piggyback:350; TPN:913] Out: 2850 [Urine:1430; Emesis/NG output:150; Drains:20; Blood:150] Intake/Output this shift:    PE: Abd: soft, tender, incision covered, but dressing is dry.  JP drain with serous output only, but scant. Heart: regular Lungs: CTAB, anteriorly  Lab Results:   Recent Labs  01/04/13 0520  WBC 13.6*  HGB 12.2  HCT 35.6*  PLT 217   BMET  Recent Labs  01-14-13 0625 01/04/13 0520  NA 133* 133*  K 3.4* 4.1  CL 103 101  CO2 23 22  GLUCOSE 116* 99  BUN 18 20  CREATININE 0.59 0.62  CALCIUM 8.2* 8.6   PT/INR No results found for this basename: LABPROT, INR,  in the last 72 hours CMP     Component Value Date/Time   NA 133* 01/04/2013 0520   NA 136 12/07/2012 1422   K 4.1 01/04/2013 0520   CL 101 01/04/2013 0520   CO2 22 01/04/2013 0520   GLUCOSE 99 01/04/2013 0520   GLUCOSE 90 12/07/2012 1422   BUN 20 01/04/2013 0520   BUN 17 12/07/2012 1422   CREATININE 0.62 01/04/2013 0520   CALCIUM 8.6 01/04/2013 0520   PROT 5.7* 01/04/2013 0520   PROT 5.8* 12/07/2012 1422   ALBUMIN 2.2* 01/04/2013 0520   AST 26 01/04/2013 0520    ALT 22 01/04/2013 0520   ALKPHOS 88 01/04/2013 0520   BILITOT 0.3 01/04/2013 0520   GFRNONAA 84* 01/04/2013 0520   GFRAA >90 01/04/2013 0520   Lipase     Component Value Date/Time   LIPASE 15 12/20/2012 2320       Studies/Results: Dg Abd 2 Views  01/14/2013   CLINICAL DATA:  History of incarcerated hernia, postop. Question small bowel obstruction.  EXAM: ABDOMEN - 2 VIEW  COMPARISON:  CT 01/01/2013. Plain films 05/03/2012.  FINDINGS: Bowel-gas pattern is stable or slightly worsened with dilated small bowel loops data the abdomen and pelvis. Multiple air-fluid levels. Colon is decompressed. There is contrast material noted within the colon. Cannot exclude small bowel obstruction.  No free air. No organomegaly. Bibasilar atelectasis.  IMPRESSION: Stable or slightly worsened small bowel dilatation. Findings suspicious for partial small bowel obstruction.   Electronically Signed   By: Charlett Nose M.D.   On: 01-14-13 11:15    Anti-infectives: Anti-infectives   Start     Dose/Rate Route Frequency Ordered Stop   01/02/13 2000  vancomycin (VANCOCIN) IVPB 1000 mg/200 mL premix     1,000 mg 200 mL/hr over 60 Minutes Intravenous Every 12 hours 01/02/13 1254 01/08/13 0759   01/01/13 0000  vancomycin (VANCOCIN)  500 mg in sodium chloride 0.9 % 100 mL IVPB  Status:  Discontinued     500 mg 100 mL/hr over 60 Minutes Intravenous Every 12 hours 12/31/12 1133 01/02/13 1254   12/31/12 1200  aztreonam (AZACTAM) 1 g in dextrose 5 % 50 mL IVPB     1 g 100 mL/hr over 30 Minutes Intravenous Every 8 hours 12/31/12 1120 01/08/13 1159   12/31/12 1200  vancomycin (VANCOCIN) IVPB 1000 mg/200 mL premix     1,000 mg 200 mL/hr over 60 Minutes Intravenous  Once 12/31/12 1133 12/31/12 1413   12/26/12 0800  ertapenem (INVANZ) 1 g in sodium chloride 0.9 % 50 mL IVPB     1 g 100 mL/hr over 30 Minutes Intravenous Every 24 hours 12/25/12 1529 12/26/12 0820   12/25/12 1100  [MAR Hold]  ertapenem (INVANZ) 1 g in  sodium chloride 0.9 % 50 mL IVPB     (On MAR Hold since 12/25/12 1112)   1 g 100 mL/hr over 30 Minutes Intravenous On call to O.R. 12/25/12 1045 12/25/12 1145   12/25/12 0800  cefOXitin (MEFOXIN) 2 g in dextrose 5 % 50 mL IVPB  Status:  Discontinued     2 g 100 mL/hr over 30 Minutes Intravenous On call to O.R. 12/25/12 0750 12/25/12 1544       Assessment/Plan  1. S/p ex lap with SBR and repair of femoral hernia 2. S/p femoral hernia repair with SBR for ischemic bowel 3. Post op ileus 4. Hypotension, on neo 5. PCM/TNA Patient Active Problem List   Diagnosis Date Noted  . HCAP (healthcare-associated pneumonia) 12/31/2012  . Leukocytosis, unspecified 12/31/2012  . Femoral hernia with gangrene and obstruction 12/25/2012  . Malnutrition of moderate degree 12/24/2012  . Moderate malnutrition 12/24/2012  . Unspecified intestinal obstruction 12/23/2012  . Nausea with vomiting 12/22/2012  . Abdominal  pain, other specified site 12/22/2012  . Hypothyroidism   . Hair thinning   . Anxiety disorder   . Other primary cardiomyopathies   . Unspecified sinusitis (chronic)   . Unspecified chronic bronchitis   . Depressive disorder, not elsewhere classified   . Restless legs syndrome (RLS)   . Chronic airway obstruction, not elsewhere classified   . PVC (premature ventricular contraction) 06/08/2011  . ANXIETY 02/03/2009  . GERD 02/03/2009  . CHRONIC SYSTOLIC HEART FAILURE 12/12/2008  . ICD-[employer] 12/10/2008  . BARRETT'S ESOPHAGUS 05/29/2008  . GASTRITIS 05/29/2008  . OTHER SPECIFIED DISORDER OF STOMACH AND DUODENUM 04/29/2008  . SLEEP APNEA 04/08/2008  . ABDOMINAL PAIN, EPIGASTRIC 04/08/2008  . NONSPECIFIC ABN FINDING RAD & OTH EXAM GI TRACT 04/08/2008   Plan: 1. Patient is doing as well as can be expected. She and i had a long discussion about what was found.   2. Cont NPO and NGT until bowel function returns 3. Wean NEO as able 4. ICU status, secondary to neo-drip per  RN 5. Continue abx per medicine for bilateral PNA 6. Appreciate cardiology assistance 7. Begin to mobilize the patient today 8. Recheck labs in am  9. Cont TNA while not able to take PO  LOS: 14 days    OSBORNE,KELLY E 01/05/2013, 8:33 AM Pager: [phone]  She looks fine. Not much from NG but she does not want this removed until she is completely better. Abdomen okay.  UOP clear. Wean neo as tolerated.

## 2013-01-05 NOTE — Op Note (Signed)
[name], [name]               ACCOUNT NO.:  [id number]  MEDICAL RECORD NO.:  [id number]  LOCATION:  1230                         FACILITY:  [facility]  PHYSICIAN:  Lodema Pilot, MD       DATE OF BIRTH:  04-Mar-1933  DATE OF PROCEDURE:  01/04/2013 DATE OF DISCHARGE:                              OPERATIVE REPORT   PROCEDURE:  Diagnostic laparoscopy with conversion to exploratory laparotomy, small-bowel resection, and femoral hernia repair.  PREOPERATIVE DIAGNOSIS:  Bowel obstruction.  POSTOPERATIVE DIAGNOSIS:  Bowel obstruction.  SURGEON:  Lodema Pilot, MD  ASSISTANT:  None.  ANESTHESIA:  General endotracheal anesthesia.  FLUIDS:  1500 mL of crystalloid.  ESTIMATED BLOOD LOSS:  150 mL.  DRAINS:  None.  SPECIMENS:  Prior small-bowel anastomosis, resected but not sent to Pathology.  COMPLICATIONS:  None apparent.  FINDINGS:  Femoral hernia containing incarcerated small bowel anastomosis causing definite transition point and high-grade bowel obstruction.  INDICATIONS FOR PROCEDURE:  [name] is a 79 year old female, who recently underwent open right inguinal hernia repair and subsequently had strangulated femoral hernia, which was repaired through the groin. She has never opened up after her procedure, and we had attempted to remove her NG tube 2 times previously and each time continues to become distended and nauseated with increasing pain.  OPERATIVE DETAILS:  [name] was seen and evaluated on the hospital ward, and risks and benefits of the procedure were discussed in lay terms.  Informed consent was obtained.  She was given prophylactic antibiotics, taken to the operating room, placed on the table in supine position with arms tucked bilaterally.  General endotracheal anesthesia was obtained.  A Foley catheter was placed, and her abdomen was prepped and draped in a standard surgical fashion.  Procedure time-out was performed with all operative team members to  confirm proper patient, procedure, and a supraumbilical midline incision was made in the skin. Dissection was carried down to subcutaneous tissue using blunt dissection.  The abdominal wall fascia was elevated and sharply incised, and the peritoneum entered bluntly.  A 12-mm balloon port was placed.  A pneumoperitoneum was obtained.  Laparoscope was introduced.  There was no evidence of bleeding or bowel injury.  She had several distended small bowel loops.  A left lower quadrant 5-mm trocar was placed under direct visualization.  A right upper quadrant 5-mm trocar and a 5-mm suprapubic trocar were was placed all under direct visualization.  I was able to run the small intestine to an obvious transition point in the femoral canal.  She had a dilated loop of small intestine entering this region.  But, with a laparoscopic traction, I was unable to reduce this bowel.  She also had some small intestines in the area, which appeared slightly purple and concerning for ischemia, and at this point, I decided to convert to open procedure and opened up the incision in the lower midline.  A self-retaining retractor was obtained, and eviscerated small bowel, and there were relatively few adhesions.  All of the proximal small bowel was distended and fluid filled, and these all tracked to the right groin, there was a sharp transition point with a loop of about a foot  and a half of terminal ileum, which was very gently decompressed.  I had to open up the peritoneum of the femoral canal in order to reduce the small bowel, which was incarcerated in the defect. I opened this up anteriorly trying to avoid injury to the ureter and the bladder, which appeared to be near by, apparently opened peritoneum.  I was able to bluntly reduce the anastomosis, although the staple line was stuck in the femoral canal.  After I reduced the anastomosis, it appeared intact, although it was fairly small and it had several  serosal tears from reduction from the defect, and I decided to resect this anastomosis and redo using the GIA 75 blue load stapler.  Both of the afferent and efferent limbs of the anastomosis were resected, and the tear was divided with Kelly clamps and 2-0 silk ties.  A 2 small bowel enterotomies were made at the corner of the antimesenteric staple lines, and the proximal bowel was decompressed, which created resection, several 100 mL of succus from this limb of the bowel, which opened up for much more working space.  A side-to-side stapled anastomosis with GIA 75 blue load stapler was performed, and the common enterotomy was closed with a TA90 stapler removing the prior staple lines as well. This staple line was oversewn with interrupted 2-0 silk Lembert sutures, and a crotch stitch was placed as well.  The anastomosis felt widely patent, and a mesenteric defect was closed with a running 2-0 silk suture.  The abdomen was irrigated with sterile saline solution till the irrigation returned clear, and then the peritoneal defect was inspected. This actually turned out to be fairly wide opening after I had opened the peritoneum, I inspected it.  We insufflated the bladder with sterile saline solution, and there was no evidence of bladder injury.  The peritoneum was then closed with 2-0 Prolene suture.  That was cognizant of the ureter, although I could not identify Cox, took superficial bites of the peritoneum in this area in order to minimize injury to that.  I contemplated placing a drain in the area, but I was afraid that that might leave an additional opening, where additional herniation confirmed, and so no drain was left in that area.  The abdomen was irrigated with sterile saline solution.  An NG tube was placed and confirmed to be in the stomach.  The 12-mm trocar access site was closed with 0 Vicryl sutures in open fashion, and the lower midline fascia was approximated with #1 PDS  suture x2, taking care to avoid injury to the underlying bowel, which was distended.  The wound was irrigated with sterile saline solution, and skin edges were approximated with skin staples.  Foley catheter was left in place, and the patient tolerated the procedure well without apparent complications.          ______________________________ Lodema Pilot, MD     BL/MEDQ  D:  01/04/2013  T:  01/05/2013  Job:  [id number]

## 2013-01-05 NOTE — Progress Notes (Signed)
[facility] PROGRESS NOTE CONSULT  [name]:[id number] DOB: [birth year] DOA: 12/22/2012 PCP: Kimber Relic, MD  Assessment/Plan:  Hospital-acquired pneumonia  -Continue vancomycin and aztreonam.  -Blood with no growth to date and sputum cultures with rare GPC- follow. -Strep pneumonia Antigen neg. -We'll continue current antibiotics till patient is hemodynamically stable  Hypotension -post-op- anesthesia likely contributing factor as discussed with surgery -Patient on Neo-Synephrine, and cardiology decreasing metoprolol dose>> then per cards is to follow and wean as BP improves  Leukocytosis  -Continue antibiotics for pneumonia as above -Patient is status post -Patient is status post exploratory lap. And surgery no evidence of ischemic bowel infection -Follow recheck in a.m. Protein caloric malnutrition  -on TNA per surgery  Wide-Complex Tachycardia -As per cardiology.  Femoral hernia, recurrent  - Status post exploratory lap. 10/30 with SBR and repair of recurrent femoral hernia -Per surgery  Code Status: Full Code Family Communication: Patient only  Disposition Plan: To be determined   Antibiotics:  Vancomycin 12/31/2012-->  Aztreonam 12/31/2012-->   Subjective: Pt denies cough and no shortness of breath  Objective: Filed Vitals:   01/05/13 0800 01/05/13 0900 01/05/13 1015 01/05/13 1100  BP: 117/48 117/38 108/41 138/46  Pulse: 65 69 73 67  Temp: 97.4 F (36.3 C)     TempSrc: Oral     Resp: 18 16 18 18   Height:      Weight:      SpO2: 96% 96% 95% 95%    Intake/Output Summary (Last 24 hours) at 01/05/13 1134 Last data filed at 01/05/13 1100  Gross per 24 hour  Intake   4388 ml  Output   2850 ml  Net   1538 ml   Filed Weights   12/22/12 1632 12/26/12 1656 01/04/13 2033  Weight: 63.9 kg (140 lb 14 oz) 62.8 kg (138 lb 7.2 oz) 62.3 kg (137 lb 5.6 oz)    Exam:   General:  Alert and appropriate, NG tube in place  Cardiovascular:  RRR  Respiratory: decreased BS at bases, no wheezes  Abdomen: soft, decreased BS ,with dressing over the incision clean and dry  Extremities: no C/C/E   Neurologic:  Grossly intact and non-focal  Data Reviewed: Basic Metabolic Panel:  Recent Labs Lab 12/31/12 0500 01/01/13 0614 01/02/13 0455 01/03/13 0625 01/04/13 0520  NA 140 139 132* 133* 133*  K 3.8 3.4* 3.6 3.4* 4.1  CL 108 108 100 103 101  CO2 22 22 23 23 22   GLUCOSE 120* 143* 117* 116* 99  BUN 23 17 14 18 20   CREATININE 1.00 0.65 0.57 0.59 0.62  CALCIUM 8.9 8.5 8.1* 8.2* 8.6  MG 1.7 1.6 1.8 1.8 1.9  PHOS 3.7 2.0* 2.9 4.2 4.2   Liver Function Tests:  Recent Labs Lab 12/31/12 0500 01/01/13 0614 01/04/13 0520  AST 17 16 26   ALT 21 17 22   ALKPHOS 69 74 88  BILITOT 0.5 0.3 0.3  PROT 5.9* 5.2* 5.7*  ALBUMIN 2.8* 2.3* 2.2*   No results found for this basename: LIPASE, AMYLASE,  in the last 168 hours No results found for this basename: AMMONIA,  in the last 168 hours CBC:  Recent Labs Lab 12/31/12 0500 01/01/13 0614 01/02/13 0455 01/04/13 0520  WBC 23.6* 24.2* 21.1* 13.6*  NEUTROABS 20.5* 21.0*  --   --   HGB 14.9 12.5 12.6 12.2  HCT 43.0 36.9 36.4 35.6*  MCV 88.1 88.7 86.9 87.7  PLT 173 148* 165 217   Cardiac Enzymes: No results found  for this basename: CKTOTAL, CKMB, CKMBINDEX, TROPONINI,  in the last 168 hours BNP (last 3 results) No results found for this basename: PROBNP,  in the last 8760 hours CBG:  Recent Labs Lab 01/04/13 0004 01/04/13 0746 01/04/13 1945 01/05/13 0006 01/05/13 0805  GLUCAP 101* 115* 133* 171* 129*    Recent Results (from the past 240 hour(s))  CULTURE, BLOOD (ROUTINE X 2)     Status: None   Collection Time    12/31/12 12:00 PM      Result Value Range Status   Specimen Description BLOOD RIGHT ARM   Final   Special Requests BOTTLES DRAWN AEROBIC AND ANAEROBIC 2 CC EA   Final   Culture  Setup Time     Final   Value: 12/31/2012 17:46     Performed at [facility]   Culture     Final   Value:        BLOOD CULTURE RECEIVED NO GROWTH TO DATE CULTURE WILL BE HELD FOR 5 DAYS BEFORE ISSUING A FINAL NEGATIVE REPORT     Performed at [facility]   Report Status PENDING   Incomplete  CULTURE, BLOOD (ROUTINE X 2)     Status: None   Collection Time    12/31/12 12:15 PM      Result Value Range Status   Specimen Description BLOOD LEFT HAND   Final   Special Requests BOTTLES DRAWN AEROBIC AND ANAEROBIC 2 CC EA   Final   Culture  Setup Time     Final   Value: 12/31/2012 17:46     Performed at [facility]   Culture     Final   Value:        BLOOD CULTURE RECEIVED NO GROWTH TO DATE CULTURE WILL BE HELD FOR 5 DAYS BEFORE ISSUING A FINAL NEGATIVE REPORT     Performed at [facility]   Report Status PENDING   Incomplete  CULTURE, EXPECTORATED SPUTUM-ASSESSMENT     Status: None   Collection Time    01/01/13  5:52 AM      Result Value Range Status   Specimen Description SPUTUM   Final   Special Requests NONE   Final   Sputum evaluation     Final   Value: THIS SPECIMEN IS ACCEPTABLE. RESPIRATORY CULTURE REPORT TO FOLLOW.   Report Status 01/01/2013 FINAL   Final  CULTURE, RESPIRATORY (NON-EXPECTORATED)     Status: None   Collection Time    01/01/13  5:52 AM      Result Value Range Status   Specimen Description SPUTUM   Final   Special Requests NONE   Final   Gram Stain     Final   Value: ABUNDANT WBC PRESENT, PREDOMINANTLY PMN     RARE SQUAMOUS EPITHELIAL CELLS PRESENT     RARE GRAM POSITIVE COCCI     IN PAIRS RARE GRAM NEGATIVE RODS     Performed at [facility]   Culture     Final   Value: NORMAL OROPHARYNGEAL FLORA     Performed at [facility]   Report Status 01/03/2013 FINAL   Final  MRSA PCR SCREENING     Status: None   Collection Time    01/04/13  8:45 PM      Result Value Range Status   MRSA by PCR NEGATIVE  NEGATIVE Final   Comment:            The GeneXpert MRSA Assay (FDA  approved  for NASAL specimens     only), is one component of a     comprehensive MRSA colonization     surveillance program. It is not     intended to diagnose MRSA     infection nor to guide or     monitor treatment for     MRSA infections.     Studies: No results found.  Scheduled Meds: . aztreonam  1 g Intravenous Q8H  . digoxin  0.125 mg Intravenous Daily  . famotidine (PEPCID) IV  20 mg Intravenous Q12H  . insulin aspart  0-9 Units Subcutaneous Q8H  . levothyroxine  50 mcg Intravenous Daily  . metoprolol  2.5 mg Intravenous Q6H  . morphine   Intravenous Q4H  . sodium chloride  10-40 mL Intracatheter Q12H  . vancomycin  1,000 mg Intravenous Q12H   Continuous Infusions: . Marland KitchenTPN (CLINIMIX-E) Adult 75 mL/hr at 01/04/13 2001   And  . fat emulsion 250 mL (01/04/13 2002)  . Marland KitchenTPN (CLINIMIX-E) Adult     And  . fat emulsion    . phenylephrine (NEO-SYNEPHRINE) Adult infusion 35 mcg/min (01/05/13 0948)    Principal Problem:   Femoral hernia with gangrene and obstruction Active Problems:   Unspecified intestinal obstruction   Malnutrition of moderate degree   Moderate malnutrition   HCAP (healthcare-associated pneumonia)   Leukocytosis, unspecified    Time spent: 25 minutes    Demontay Grantham C  [facility] Pager [phone]  If 7PM-7AM, please contact night-coverage at [email or web address], password [facility] 01/05/2013, 11:34 AM  LOS: 14 days

## 2013-01-05 NOTE — Progress Notes (Signed)
Physical Therapy Treatment Patient Details Name: [name] MRN: [id number] DOB: 24-Aug-1932 Today's Date: 01/05/2013 Time: 1191-4782 PT Time Calculation (min): 17 min  PT Assessment / Plan / Recommendation  History of Present Illness 79 yo female admitted 12/22/12. S/P explaoratory lap, SB resection, primary repair strangulated femoral hernia on 12/25/12. 01/04/13 underwent additonal exploratory laproscopic abdominal surgery.   PT Comments   Pt limited in ability to participate in PT today due to abdominal pain as she underwent second laproscopic abdominal procedure on 01/04/13.  Pt able to perform sit<>stand transfer and ambulate 10' with RW all with +2 assist to manage lines and for stability.  Pt would continue to benefit from skilled PT to improve functional mobility and safety.  Follow Up Recommendations  SNF;Supervision/Assistance - 24 hour     Does the patient have the potential to tolerate intense rehabilitation     Barriers to Discharge        Equipment Recommendations  None recommended by PT    Recommendations for Other Services    Frequency Min 3X/week   Progress towards PT Goals Progress towards PT goals: Goals downgraded-see care plan  Plan Current plan remains appropriate    Precautions / Restrictions Precautions Precautions: Fall Precaution Comments: , R  abd drain. Restrictions Weight Bearing Restrictions: No   Pertinent Vitals/Pain Pt reports 6/10 abdominal pain at rest, PT encouraged pt to utilize PCA pump to decrease pain, pt reports pain decreased to 4-5/10 after ambulation. Pt positioned to comfort at end of session.  Pt ambulated on room air and SaO2 ranged between 87-90% with no c/o SOB, PT reapplied 2L of O2 [place] at end of session and RN in the room.    Mobility  Bed Mobility Bed Mobility: Not assessed Details for Bed Mobility Assistance: pt sitting up in chair upon arrival. Transfers Transfers: Sit to Stand;Stand to Sit Sit to Stand: 4: Min  assist;From chair/3-in-1;With upper extremity assist;With armrests Stand to Sit: 4: Min assist;With upper extremity assist;With armrests;To chair/3-in-1 Details for Transfer Assistance: +2 assist due to multiple lines/leads/tubes, and min A due to weakness and pt reports being anxious about standing/walking. VC's for hand placement and to scoot forward in chair to set up for successful transfer into standing. Ambulation/Gait Ambulation/Gait Assistance: 4: Min assist Ambulation Distance (Feet): 10 Feet Assistive device: Rolling walker Ambulation/Gait Assistance Details: +2 assist to manage lines/leads/tubes and min A to steady pt during ambulation with RW. VC's for gait sequence. pt required standing rest breaks due to fatigue and pain. pt's vitals monitored during session, please see vitals section for detail. Gait Pattern: Step-through pattern;Decreased stride length;Antalgic;Trunk flexed Gait velocity: decreased    Exercises     PT Diagnosis:    PT Problem List:   PT Treatment Interventions:     PT Goals (current goals can now be found in the care plan section) Acute Rehab PT Goals Patient Stated Goal: none specified PT Goal Formulation: With patient Time For Goal Achievement: 01/19/13 Potential to Achieve Goals: Good  Visit Information  Last PT Received On: 01/05/13 Assistance Needed: +2 (for safety) History of Present Illness: 79 yo female admitted 12/22/12. S/P explaoratory lap, SB resection, primary repair strangulated femoral hernia on 12/25/12. 01/04/13 underwent additonal exploratory laproscopic abdominal surgery.    Subjective Data  Patient Stated Goal: none specified   Cognition  Cognition Arousal/Alertness: Awake/alert Behavior During Therapy: WFL for tasks assessed/performed Overall Cognitive Status: Within Functional Limits for tasks assessed    Balance     End of  Session PT - End of Session Activity Tolerance: Patient limited by pain Patient left: in  chair;with call bell/phone within reach;with nursing/sitter in room Nurse Communication: Mobility status   GP     Sol Blazing 01/05/2013, 2:47 PM

## 2013-01-05 NOTE — Progress Notes (Signed)
Brief Pharmacy Consult Note - Vancomycin  Labs: vanc trough 15.8  A/P: vanc trough at goal of 15-20 for HAP. Continue current dosing of 1g q12   Hessie Knows, PharmD, BCPS Pager [phone] 01/05/2013 9:24 PM

## 2013-01-05 NOTE — Progress Notes (Signed)
Occupational Therapy Treatment and goal update Patient Details Name: [name] MRN: [id number] DOB: 05-04-1932 Today's Date: 01/05/2013 Time: 7829-5621 OT Time Calculation (min): 10 min  OT Assessment / Plan / Recommendation  History of present illness 79 yo female admitted 12/22/12. S/P explaoratory lap, SB resection, primary repair strangulated femoral hernia on 12/25/12. 01/04/13 underwent additonal exploratory laproscopic abdominal surgery.   OT comments  Pt had surgery yesterday and is limited by pain/decreased endurance.  Goals downgraded this session    Follow Up Recommendations  SNF    Barriers to Discharge       Equipment Recommendations  3 in 1 bedside comode    Recommendations for Other Services    Frequency Min 2X/week   Progress towards OT Goals Progress towards OT goals: Goals drowngraded-see care plan  Plan      Precautions / Restrictions Precautions Precautions: Fall Precaution Comments: , R  abd drain. Restrictions Weight Bearing Restrictions: No   Pertinent Vitals/Pain 5/10 abdomen    ADL  Transfers/Ambulation Related to ADLs: pt did not want to stand, even for pressure relief. Pt had been sitting up for awhile.   She ambulated a little with PT earlier and needed min A for sit to stand ADL Comments: Pt did not feel up to performing adls this session:  max to total A with adls due to pain/decreased endurance.    She can cross legs but cannot reach to feet for adls.  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  Will further explore on next visit.   Encouraged AROM to tolerance and talked about balancing activities for energy conservation.     OT Diagnosis:    OT Problem List:   OT Treatment Interventions:     OT Goals(current goals can now be found in the care plan section) Acute Rehab OT Goals Patient Stated Goal: none specified OT Goal Formulation: With patient Time For Goal Achievement: 01/19/13 Potential to Achieve Goals: Good ADL Goals Pt  Will Perform Grooming: with supervision;standing (1 task) Pt Will Perform Upper Body Bathing: with supervision;sitting Pt Will Perform Lower Body Bathing: with min assist;with adaptive equipment;sit to/from stand Pt Will Perform Lower Body Dressing: with min assist;sit to/from stand;with adaptive equipment Pt Will Transfer to Toilet: stand pivot transfer;with min guard assist;bedside commode Pt Will Perform Toileting - Clothing Manipulation and hygiene: with min guard assist;sit to/from stand  Visit Information  Last OT Received On: 01/05/13 Assistance Needed: +2 (safety with movement) History of Present Illness: 79 yo female admitted 12/22/12. S/P explaoratory lap, SB resection, primary repair strangulated femoral hernia on 12/25/12. 01/04/13 underwent additonal exploratory laproscopic abdominal surgery.    Subjective Data      Prior Functioning       Cognition  Cognition Arousal/Alertness: Awake/alert Behavior During Therapy: WFL for tasks assessed/performed Overall Cognitive Status: Within Functional Limits for tasks assessed    Mobility      Exercises  Other Exercises Other Exercises: arom to bil shoulders; encouraged her to do 5 reps at a time as she is able.     Balance     End of Session OT - End of Session Activity Tolerance: Patient limited by fatigue;Patient limited by pain Patient left: in chair;with call bell/phone within reach  GO     [facility] 01/05/2013, 3:56 PM Marica Otter, OTR/L [phone] 01/05/2013

## 2013-01-05 NOTE — Progress Notes (Signed)
PARENTERAL NUTRITION CONSULT NOTE   Pharmacy Consult for TNA Indication: Ileus  Allergies  Allergen Reactions  . Hydrocodone Itching  . Cephalexin Itching and Swelling  . Doxycycline Other (See Comments)    Per pt: unknown  . Ofloxacin Other (See Comments)    Per pt: unknown  . Tape Other (See Comments)    Burns skin.   . Latex Rash    Patient Measurements: Height: 5\' 8"  (172.7 cm) Weight: 137 lb 5.6 oz (62.3 kg) IBW/kg (Calculated) : 63.9  Vital Signs: Temp: 97.4 F (36.3 C) (10/31 0800) Temp src: Oral (10/31 0800) BP: 90/47 mmHg (10/31 0715) Pulse Rate: 69 (10/31 0715) Intake/Output from previous day: 10/30 0701 - 10/31 0700 In: 3540.5 [I.V.:2157.5; IV Piggyback:350; TPN:913] Out: 2850 [Urine:1430; Emesis/NG output:150; Drains:20; Blood:150] Intake/Output from this shift:    Labs:  Recent Labs  01/04/13 0520  WBC 13.6*  HGB 12.2  HCT 35.6*  PLT 217     Recent Labs  01/03/13 0625 01/04/13 0520  NA 133* 133*  K 3.4* 4.1  CL 103 101  CO2 23 22  GLUCOSE 116* 99  BUN 18 20  CREATININE 0.59 0.62  CALCIUM 8.2* 8.6  MG 1.8 1.9  PHOS 4.2 4.2  PROT  --  5.7*  ALBUMIN  --  2.2*  AST  --  26  ALT  --  22  ALKPHOS  --  88  BILITOT  --  0.3   Estimated Creatinine Clearance: 57 ml/min (by C-G formula based on Cr of 0.62).    Recent Labs  01/04/13 1945 01/05/13 0006 01/05/13 0805  GLUCAP 133* 171* 129*    Medical History: Past Medical History  Diagnosis Date  . Nonischemic cardiomyopathy     EF initially 20%; Last measurement up to 50% per echo in August of 2012  . CHF (congestive heart failure)   . LBBB (left bundle branch block)   . Mitral regurgitation   . Melanoma     right arm  . Hypothyroidism   . Erosive gastritis 08/2004  . Hyperplastic polyps of stomach 06/2002  . Barrett's esophagus 04/2008  . GERD (gastroesophageal reflux disease)   . Diverticulitis   . Anxiety disorder   . HTN (hypertension)   . ICD (implantable cardiac  defibrillator) in place     BiV/ICD; s/p removal of ICD with insertion of BiV PPM 04/26/12  . Hair thinning   . Unspecified sinusitis (chronic)   . Unspecified chronic bronchitis   . Depressive disorder, not elsewhere classified   . Restless legs syndrome (RLS)   . Chronic airway obstruction, not elsewhere classified   . Benign neoplasm of colon   . Other and unspecified hyperlipidemia   . Unspecified nasal polyp   . Pain in joint, lower leg   . Synovial cyst of popliteal space   . Complication of anesthesia     slow to wake up  . Pacemaker 04/26/2012  . Sleep apnea   . Other and unspecified hyperlipidemia   . Dysthymic disorder   . Palpitations     Medications:  Scheduled:  . aztreonam  1 g Intravenous Q8H  . digoxin  0.125 mg Intravenous Daily  . famotidine (PEPCID) IV  20 mg Intravenous Q12H  . insulin aspart  0-9 Units Subcutaneous Q8H  . levothyroxine  50 mcg Intravenous Daily  . metoprolol  2.5 mg Intravenous Q6H  . morphine   Intravenous Q4H  . sodium chloride  10-40 mL Intracatheter Q12H  . vancomycin  1,000 mg Intravenous Q12H   Infusions:  . Marland KitchenTPN (CLINIMIX-E) Adult 75 mL/hr at 01/04/13 2001   And  . fat emulsion 250 mL (01/04/13 2002)  . phenylephrine (NEO-SYNEPHRINE) Adult infusion 35 mcg/min (01/05/13 0600)    Current Nutrition:  NPO with NG tube  Nutritional Goals:  Per RD 10/24:  Kcal: 1650-1900  Protein: 75-90 g  Fluid: >1.7 L  Clinimix E 5/15 at goal rate of 75 ml/hr + 20% Lipids at 29ml/hr will provide 90g protein, 1656 kcal per day  NS + KCl at [facility]  Assessment: 79 yo female s/p small bowel resection and tissue repair of right femoral hernia on 10/20. Diet advanced to clears on 10/24 but had significant abdominal pain, nausea and vomiting, 10/26 abd film revealed dilated small bowel loops suspicious for SBO. Patient NPO, order to place PICC and begin TNA on 10/25. Initially (Per IV team), patient refused PICC placement on 10/25, but was  agreeable to placement 10/26.  S/p ex lap on 10/30 with femoral hernia repair with small bowel resection for ischemic bowel.  Patient is hypotension on neo gtt.  Continue NPO and NGT until bowel function returns.   10/31: Day #6 TPN.  S/p Ex lap on 10/30 with femoral hernia repair with small bowel resection for ischemic bowel.  Continue NPO and NGT until bowel function returns.   Glucose: CBGs almost all at goal of < 150, 3 unit SSI insulin required/24h Labs from 10/30:  Electrolytes: Na = 133 (suspect from TPN, unable to adjust in premixed TPN product), K = 4.1, Phos = 4.2, Mg = 1.9, Corr Ca WNL Renal function: wnl, UOP adequate Hepatic function: wnl  TG: 89 (10/27) Prealb: low, 8.6 (10/23), 5.1 (10/27)  Plan:   Continue Clinimix E 5/15 at goal 75 ml/hr as tolerating TPN   20% fat emulsion at 8 ml/hr (to keep fat calories to <30% total kcals).  TNA to contain standard multivitamins and trace elements.  Continue CBG/SSI q8h  TNA lab panels on Mondays & Thursdays.   BorgerdingLoma Messing PharmD Pager #: [phone] 9:27 AM 01/05/2013

## 2013-01-05 NOTE — Progress Notes (Signed)
I have reviewed this note and agree with all findings. Kati Green Quin[place], PT, DPT Pager: [phone]   

## 2013-01-06 LAB — GLUCOSE, CAPILLARY
Glucose-Capillary: 128 mg/dL — ABNORMAL HIGH (ref 70–99)
Glucose-Capillary: 94 mg/dL (ref 70–99)
Glucose-Capillary: 100 mg/dL — ABNORMAL HIGH (ref 70–99)

## 2013-01-06 LAB — CBC
HCT: 32.7 % — ABNORMAL LOW (ref 36.0–46.0)
Hemoglobin: 11.2 g/dL — ABNORMAL LOW (ref 12.0–15.0)
MCH: 30.2 pg (ref 26.0–34.0)
MCHC: 34.3 g/dL (ref 30.0–36.0)
MCV: 88.1 fL (ref 78.0–100.0)
RBC: 3.71 MIL/uL — ABNORMAL LOW (ref 3.87–5.11)

## 2013-01-06 LAB — BASIC METABOLIC PANEL
BUN: 20 mg/dL (ref 6–23)
CO2: 24 mEq/L (ref 19–32)
Calcium: 8.3 mg/dL — ABNORMAL LOW (ref 8.4–10.5)
Creatinine, Ser: 0.56 mg/dL (ref 0.50–1.10)
GFR calc non Af Amer: 87 mL/min — ABNORMAL LOW (ref >90)
Glucose, Bld: 125 mg/dL — ABNORMAL HIGH (ref 70–99)

## 2013-01-06 LAB — CULTURE, BLOOD (ROUTINE X 2): Culture: NO GROWTH

## 2013-01-06 MED ORDER — FAT EMULSION 20 % IV EMUL
250.0000 mL | INTRAVENOUS | Status: AC
  Administered 2013-01-06: 250 mL via INTRAVENOUS
  Filled 2013-01-06: qty 250

## 2013-01-06 MED ORDER — VITAMINS A & D EX OINT
TOPICAL_OINTMENT | CUTANEOUS | Status: AC
  Filled 2013-01-06: qty 5

## 2013-01-06 MED ORDER — TRACE MINERALS CR-CU-F-FE-I-MN-MO-SE-ZN IV SOLN
INTRAVENOUS | Status: AC
  Administered 2013-01-06: 18:00:00 via INTRAVENOUS
  Filled 2013-01-06: qty 2000

## 2013-01-06 NOTE — Progress Notes (Signed)
PARENTERAL NUTRITION CONSULT NOTE   Pharmacy Consult for TNA Indication: Ileus  Allergies  Allergen Reactions  . Hydrocodone Itching  . Cephalexin Itching and Swelling  . Doxycycline Other (See Comments)    Per pt: unknown  . Ofloxacin Other (See Comments)    Per pt: unknown  . Tape Other (See Comments)    Burns skin.   . Latex Rash    Patient Measurements: Height: 5\' 8"  (172.7 cm) Weight: 139 lb 12.4 oz (63.4 kg) IBW/kg (Calculated) : 63.9  Vital Signs: Temp: 98.2 F (36.8 C) (11/01 0400) Temp src: Oral (11/01 0400) BP: 95/47 mmHg (11/01 0415) Pulse Rate: 73 (11/01 0415) Intake/Output from previous day: 10/31 0701 - 11/01 0700 In: 3108.5 [I.V.:552.5; IV Piggyback:470; TPN:1826] Out: 2955 [Urine:2055; Emesis/NG output:890; Drains:10] Intake/Output from this shift:    Labs:  Recent Labs  01/04/13 0520 01/06/13 0430  WBC 13.6* 16.1*  HGB 12.2 11.2*  HCT 35.6* 32.7*  PLT 217 241     Recent Labs  01/04/13 0520 01/06/13 0430  NA 133* 130*  K 4.1 4.1  CL 101 101  CO2 22 24  GLUCOSE 99 125*  BUN 20 20  CREATININE 0.62 0.56  CALCIUM 8.6 8.3*  MG 1.9  --   PHOS 4.2  --   PROT 5.7*  --   ALBUMIN 2.2*  --   AST 26  --   ALT 22  --   ALKPHOS 88  --   BILITOT 0.3  --    Estimated Creatinine Clearance: 58 ml/min (by C-G formula based on Cr of 0.56).    Recent Labs  01/05/13 1601 01/05/13 2318 01/06/13 0402  GLUCAP 115* 121* 115*    Medical History: Past Medical History  Diagnosis Date  . Nonischemic cardiomyopathy     EF initially 20%; Last measurement up to 50% per echo in August of 2012  . CHF (congestive heart failure)   . LBBB (left bundle branch block)   . Mitral regurgitation   . Melanoma     right arm  . Hypothyroidism   . Erosive gastritis 08/2004  . Hyperplastic polyps of stomach 06/2002  . Barrett's esophagus 04/2008  . GERD (gastroesophageal reflux disease)   . Diverticulitis   . Anxiety disorder   . HTN (hypertension)   .  ICD (implantable cardiac defibrillator) in place     BiV/ICD; s/p removal of ICD with insertion of BiV PPM 04/26/12  . Hair thinning   . Unspecified sinusitis (chronic)   . Unspecified chronic bronchitis   . Depressive disorder, not elsewhere classified   . Restless legs syndrome (RLS)   . Chronic airway obstruction, not elsewhere classified   . Benign neoplasm of colon   . Other and unspecified hyperlipidemia   . Unspecified nasal polyp   . Pain in joint, lower leg   . Synovial cyst of popliteal space   . Complication of anesthesia     slow to wake up  . Pacemaker 04/26/2012  . Sleep apnea   . Other and unspecified hyperlipidemia   . Dysthymic disorder   . Palpitations     Medications:  Scheduled:  . aztreonam  1 g Intravenous Q8H  . digoxin  0.125 mg Intravenous Daily  . famotidine (PEPCID) IV  20 mg Intravenous Q12H  . insulin aspart  0-9 Units Subcutaneous Q8H  . levothyroxine  50 mcg Intravenous Daily  . metoprolol  2.5 mg Intravenous Q6H  . morphine   Intravenous Q4H  . sodium chloride  10-40 mL Intracatheter Q12H  . vancomycin  1,000 mg Intravenous Q12H   Infusions:  . Marland KitchenTPN (CLINIMIX-E) Adult 75 mL/hr at 01/05/13 1804   And  . fat emulsion 250 mL (01/05/13 1804)  . phenylephrine (NEO-SYNEPHRINE) Adult infusion Stopped (01/05/13 1830)    Current Nutrition:  NPO with NG tube  Nutritional Goals:  Per RD 10/24:  Kcal: 1650-1900  Protein: 75-90 g  Fluid: >1.7 L  Clinimix E 5/15 at goal rate of 75 ml/hr + 20% Lipids at 74ml/hr will provide 90g protein, 1656 kcal per day  NS + KCl at [facility]  Assessment: 79 yo female s/p small bowel resection and tissue repair of right femoral hernia on 10/20. Diet advanced to clears on 10/24 but had significant abdominal pain, nausea and vomiting, 10/26 abd film revealed dilated small bowel loops suspicious for SBO. Patient NPO, order to place PICC and begin TNA on 10/25. Initially (Per IV team), patient refused PICC placement  on 10/25, but was agreeable to placement 10/26.  S/p ex lap on 10/30 with femoral hernia repair with small bowel resection for ischemic bowel.  Patient is hypotension on neo gtt.  Continue NPO and NGT until bowel function returns.   11/1: Day #7 TPN.  S/p Ex lap on 10/30 with femoral hernia repair with small bowel resection for ischemic bowel.  Continue NPO and NGT until bowel function returns.   Glucose: CBGs at goal of < 150, 2 unit SSI insulin required/24h Labs from 10/30:  Electrolytes: Na = 130 (suspect from TPN, unable to adjust in premixed TPN product), other BMET labs  WNL, mg/phos on Monday  Renal function: wnl Hepatic function: wnl  10/30 TG: 89 (10/27) Prealb: low, 8.6 (10/23), 5.1 (10/27)  Plan:   Continue Clinimix E 5/15 at goal 75 ml/hr as tolerating TPN   20% fat emulsion at 8 ml/hr (to keep fat calories to <30% total kcals).  TNA to contain standard multivitamins and trace elements.  Continue CBG/SSI q8h  TNA lab panels on Mondays & Thursdays.   Kalei Meda, Loma Messing PharmD Pager #: [phone] 7:21 AM 01/06/2013

## 2013-01-06 NOTE — Progress Notes (Signed)
[facility] PROGRESS NOTE CONSULT  [name] NFA:[id number] DOB: 12-Aug-1932 DOA: 12/22/2012 PCP: Kimber Relic, MD  Assessment/Plan:  Hospital-acquired pneumonia  -will Continue vancomycin and aztreonam to complete 8 days, started on 10/26.  -Blood with no growth to date  -Strep pneumonia Antigen neg. -We'll continue current antibiotics till patient is hemodynamically stable  Hypotension -post-op- anesthesia likely contributing factor as discussed with surgery -Patient was on Neo-Synephrine, and cardiology decreased metoprolol dose -Patient clinically improved, now weaned off Neo-Synephrine  Leukocytosis  -Continue antibiotics for pneumonia as above -Patient is status post exploratory lap. And surgery no evidence of ischemic bowel infection -Trending up postop, but now on more hemodynamically stable, follow and recheck Protein caloric malnutrition  -on TNA per surgery  Wide-Complex Tachycardia -As per cardiology.  Femoral hernia, recurrent  - Status post exploratory lap. 10/30 with SBR and repair of recurrent femoral hernia -Per surgery  Code Status: Full Code Family Communication: Patient only  Disposition Plan: To be determined   Antibiotics:  Vancomycin 12/31/2012-->  Aztreonam 12/31/2012-->   Subjective: -Reports intermittent cough, denies shortness of breath.  Objective: Filed Vitals:   01/06/13 0422 01/06/13 0640 01/06/13 0800 01/06/13 0850  BP:  97/44    Pulse:  71    Temp:   97.6 F (36.4 [name])   TempSrc:   Oral   Resp: 24 17  17   Height:      Weight:      SpO2: 95% 89%  100%    Intake/Output Summary (Last 24 hours) at 01/06/13 0904 Last data filed at 01/06/13 0600  Gross per 24 hour  Intake 2777.5 ml  Output   2755 ml  Net   22.5 ml   Filed Weights   12/26/12 1656 01/04/13 2033 01/06/13 0400  Weight: 62.8 kg (138 lb 7.2 oz) 62.3 kg (137 lb 5.6 oz) 63.4 kg (139 lb 12.4 oz)    Exam:   General:  Alert and appropriate, NG tube  in place  Cardiovascular: RRR  Respiratory: decreased BS at bases, no wheezes  Abdomen: soft, decreased BS ,with dressing over the incision clean and dry  Extremities: no [name]/[name]/E   Neurologic:  Grossly intact and non-focal  Data Reviewed: Basic Metabolic Panel:  Recent Labs Lab 12/31/12 0500 01/01/13 0614 01/02/13 0455 01/03/13 0625 01/04/13 0520 01/06/13 0430  NA 140 139 132* 133* 133* 130*  K 3.8 3.4* 3.6 3.4* 4.1 4.1  CL 108 108 100 103 101 101  CO2 22 22 23 23 22 24   GLUCOSE 120* 143* 117* 116* 99 125*  BUN 23 17 14 18 20 20   CREATININE 1.00 0.65 0.57 0.59 0.62 0.56  CALCIUM 8.9 8.5 8.1* 8.2* 8.6 8.3*  MG 1.7 1.6 1.8 1.8 1.9  --   PHOS 3.7 2.0* 2.9 4.2 4.2  --    Liver Function Tests:  Recent Labs Lab 12/31/12 0500 01/01/13 0614 01/04/13 0520  AST 17 16 26   ALT 21 17 22   ALKPHOS 69 74 88  BILITOT 0.5 0.3 0.3  PROT 5.9* 5.2* 5.7*  ALBUMIN 2.8* 2.3* 2.2*   No results found for this basename: LIPASE, AMYLASE,  in the last 168 hours No results found for this basename: AMMONIA,  in the last 168 hours CBC:  Recent Labs Lab 12/31/12 0500 01/01/13 0614 01/02/13 0455 01/04/13 0520 01/06/13 0430  WBC 23.6* 24.2* 21.1* 13.6* 16.1*  NEUTROABS 20.5* 21.0*  --   --   --   HGB 14.9 12.5 12.6 12.2 11.2*  HCT  43.0 36.9 36.4 35.6* 32.7*  MCV 88.1 88.7 86.9 87.7 88.1  PLT 173 148* 165 217 241   Cardiac Enzymes: No results found for this basename: CKTOTAL, CKMB, CKMBINDEX, TROPONINI,  in the last 168 hours BNP (last 3 results) No results found for this basename: PROBNP,  in the last 8760 hours CBG:  Recent Labs Lab 01/05/13 1210 01/05/13 1601 01/05/13 2318 01/06/13 0402 01/06/13 0759  GLUCAP 110* 115* 121* 115* 128*    Recent Results (from the past 240 hour(s))  CULTURE, BLOOD (ROUTINE X 2)     Status: None   Collection Time    12/31/12 12:00 PM      Result Value Range Status   Specimen Description BLOOD RIGHT ARM   Final   Special Requests  BOTTLES DRAWN AEROBIC AND ANAEROBIC 2 CC EA   Final   Culture  Setup Time     Final   Value: 12/31/2012 17:46     Performed at [facility]   Culture     Final   Value:        BLOOD CULTURE RECEIVED NO GROWTH TO DATE CULTURE WILL BE HELD FOR 5 DAYS BEFORE ISSUING A FINAL NEGATIVE REPORT     Performed at [facility]   Report Status PENDING   Incomplete  CULTURE, BLOOD (ROUTINE X 2)     Status: None   Collection Time    12/31/12 12:15 PM      Result Value Range Status   Specimen Description BLOOD LEFT HAND   Final   Special Requests BOTTLES DRAWN AEROBIC AND ANAEROBIC 2 CC EA   Final   Culture  Setup Time     Final   Value: 12/31/2012 17:46     Performed at [facility]   Culture     Final   Value:        BLOOD CULTURE RECEIVED NO GROWTH TO DATE CULTURE WILL BE HELD FOR 5 DAYS BEFORE ISSUING A FINAL NEGATIVE REPORT     Performed at [facility]   Report Status PENDING   Incomplete  CULTURE, EXPECTORATED SPUTUM-ASSESSMENT     Status: None   Collection Time    01/01/13  5:52 AM      Result Value Range Status   Specimen Description SPUTUM   Final   Special Requests NONE   Final   Sputum evaluation     Final   Value: THIS SPECIMEN IS ACCEPTABLE. RESPIRATORY CULTURE REPORT TO FOLLOW.   Report Status 01/01/2013 FINAL   Final  CULTURE, RESPIRATORY (NON-EXPECTORATED)     Status: None   Collection Time    01/01/13  5:52 AM      Result Value Range Status   Specimen Description SPUTUM   Final   Special Requests NONE   Final   Gram Stain     Final   Value: ABUNDANT WBC PRESENT, PREDOMINANTLY PMN     RARE SQUAMOUS EPITHELIAL CELLS PRESENT     RARE GRAM POSITIVE COCCI     IN PAIRS RARE GRAM NEGATIVE RODS     Performed at [facility]   Culture     Final   Value: NORMAL OROPHARYNGEAL FLORA     Performed at [facility]   Report Status 01/03/2013 FINAL   Final  MRSA PCR SCREENING     Status: None   Collection Time    01/04/13  8:45 PM       Result Value Range Status  MRSA by PCR NEGATIVE  NEGATIVE Final   Comment:            The GeneXpert MRSA Assay (FDA     approved for NASAL specimens     only), is one component of a     comprehensive MRSA colonization     surveillance program. It is not     intended to diagnose MRSA     infection nor to guide or     monitor treatment for     MRSA infections.     Studies: No results found.  Scheduled Meds: . aztreonam  1 g Intravenous Q8H  . digoxin  0.125 mg Intravenous Daily  . famotidine (PEPCID) IV  20 mg Intravenous Q12H  . insulin aspart  0-9 Units Subcutaneous Q8H  . levothyroxine  50 mcg Intravenous Daily  . metoprolol  2.5 mg Intravenous Q6H  . morphine   Intravenous Q4H  . sodium chloride  10-40 mL Intracatheter Q12H  . vancomycin  1,000 mg Intravenous Q12H   Continuous Infusions: . Marland KitchenTPN (CLINIMIX-E) Adult 75 mL/hr at 01/05/13 1804   And  . fat emulsion 250 mL (01/05/13 1804)  . Marland KitchenTPN (CLINIMIX-E) Adult     And  . fat emulsion    . phenylephrine (NEO-SYNEPHRINE) Adult infusion Stopped (01/05/13 1830)    Principal Problem:   Femoral hernia with gangrene and obstruction Active Problems:   Unspecified intestinal obstruction   Malnutrition of moderate degree   Moderate malnutrition   HCAP (healthcare-associated pneumonia)   Leukocytosis, unspecified    Time spent: 25 minutes    [name]  [facility] Pager [phone]  If 7PM-7AM, please contact night-coverage at [email or web address], password [facility] 01/06/2013, 9:04 AM  LOS: 15 days

## 2013-01-06 NOTE — Progress Notes (Signed)
Femoral hernia with gangrene and obstruction S/p Ing hernia repair followed by femoral hernia repair/SBR, followed by repeat femoral hernia repair internally with repeat SBR  Subjective: Having some pain, but controlled with PCA.  No nausea, states small amt of flatus  Objective: Vital signs in last 24 hours: Temp:  [97.6 F (36.4 C)-98.6 F (37 C)] 97.6 F (36.4 C) (11/01 0800) Pulse Rate:  [69-78] 71 (11/01 0640) Resp:  [13-24] 17 (11/01 0850) BP: (87-141)/(32-70) 97/44 mmHg (11/01 0640) SpO2:  [89 %-100 %] 100 % (11/01 0850) FiO2 (%):  [44 %-96 %] 44 % (11/01 0422) Weight:  [139 lb 12.4 oz (63.4 kg)] 139 lb 12.4 oz (63.4 kg) (11/01 0400) Last BM Date: 01/03/13  Intake/Output from previous day: 10/31 0701 - 11/01 0700 In: 3108.5 [I.V.:552.5; IV Piggyback:470; TPN:1826] Out: 2955 [Urine:2055; Emesis/NG output:890; Drains:10] Intake/Output this shift:    General appearance: alert and cooperative GI: soft, appropriately tender, non-distended Incision/Wound:  Lab Results:  Results for orders placed during the hospital encounter of 12/22/12 (from the past 24 hour(s))  GLUCOSE, CAPILLARY     Status: Abnormal   Collection Time    01/05/13 12:10 PM      Result Value Range   Glucose-Capillary 110 (*) 70 - 99 mg/dL  GLUCOSE, CAPILLARY     Status: Abnormal   Collection Time    01/05/13  4:01 PM      Result Value Range   Glucose-Capillary 115 (*) 70 - 99 mg/dL  VANCOMYCIN, TROUGH     Status: None   Collection Time    01/05/13  7:30 PM      Result Value Range   Vancomycin Tr 15.8  10.0 - 20.0 ug/mL  GLUCOSE, CAPILLARY     Status: Abnormal   Collection Time    01/05/13 11:18 PM      Result Value Range   Glucose-Capillary 121 (*) 70 - 99 mg/dL   Comment 1 Documented in Chart     Comment 2 Notify RN    GLUCOSE, CAPILLARY     Status: Abnormal   Collection Time    01/06/13  4:02 AM      Result Value Range   Glucose-Capillary 115 (*) 70 - 99 mg/dL   Comment 1 Documented in  Chart     Comment 2 Notify RN    CBC     Status: Abnormal   Collection Time    01/06/13  4:30 AM      Result Value Range   WBC 16.1 (*) 4.0 - 10.5 K/uL   RBC 3.71 (*) 3.87 - 5.11 MIL/uL   Hemoglobin 11.2 (*) 12.0 - 15.0 g/dL   HCT 16.1 (*) 09.6 - 04.5 %   MCV 88.1  78.0 - 100.0 fL   MCH 30.2  26.0 - 34.0 pg   MCHC 34.3  30.0 - 36.0 g/dL   RDW 40.9  81.1 - 91.4 %   Platelets 241  150 - 400 K/uL  BASIC METABOLIC PANEL     Status: Abnormal   Collection Time    01/06/13  4:30 AM      Result Value Range   Sodium 130 (*) 135 - 145 mEq/L   Potassium 4.1  3.5 - 5.1 mEq/L   Chloride 101  96 - 112 mEq/L   CO2 24  19 - 32 mEq/L   Glucose, Bld 125 (*) 70 - 99 mg/dL   BUN 20  6 - 23 mg/dL   Creatinine, Ser 7.82  0.50 -  1.10 mg/dL   Calcium 8.3 (*) 8.4 - 10.5 mg/dL   GFR calc non Af Amer 87 (*) >90 mL/min   GFR calc Af Amer >90  >90 mL/min  GLUCOSE, CAPILLARY     Status: Abnormal   Collection Time    01/06/13  7:59 AM      Result Value Range   Glucose-Capillary 128 (*) 70 - 99 mg/dL   Comment 1 Documented in Chart     Comment 2 Notify RN       Studies/Results Radiology     MEDS, Scheduled . aztreonam  1 g Intravenous Q8H  . digoxin  0.125 mg Intravenous Daily  . famotidine (PEPCID) IV  20 mg Intravenous Q12H  . insulin aspart  0-9 Units Subcutaneous Q8H  . levothyroxine  50 mcg Intravenous Daily  . metoprolol  2.5 mg Intravenous Q6H  . morphine   Intravenous Q4H  . sodium chloride  10-40 mL Intracatheter Q12H  . vancomycin  1,000 mg Intravenous Q12H     Assessment: Femoral hernia with gangrene and obstruction ileus  Plan: Cont TPN Will d/c foley today Clamping trials for NG Ambulate    LOS: 15 days    Vanita Panda, MD [facility], [name] [phone]   01/06/2013 11:13 AM

## 2013-01-06 NOTE — Progress Notes (Signed)
Foley catheter removed, per order.  Patient's NG tube clamped for 6 hours, per order.  Patient denied any nausea.  NG tube returned to suction with 50 cc output.  Patient ambulated in hallway with assistance, tolerated well.

## 2013-01-06 NOTE — Progress Notes (Signed)
Subjective:  No complaints of shortness of breath or chest pain at the present time.  Objective:  Vital Signs in the last 24 hours: BP 97/44  Pulse 71  Temp(Src) 98.2 F (36.8 C) (Oral)  Resp 17  Ht 5\' 8"  (1.727 m)  Wt 63.4 kg (139 lb 12.4 oz)  BMI 21.26 kg/m2  SpO2 89%  Physical Exam: Thin pleasant elderly female in no acute distress, in G-tube in place Lungs:  Clear  Cardiac:  Regular rhythm, normal S1 and S2, no S3 Extremities:  No edema present  Intake/Output from previous day: 10/31 0701 - 11/01 0700 In: 3108.5 [I.V.:552.5; IV Piggyback:470; TPN:1826] Out: 2955 [Urine:2055; Emesis/NG output:890; Drains:10] Weight Filed Weights   12/26/12 1656 01/04/13 2033 01/06/13 0400  Weight: 62.8 kg (138 lb 7.2 oz) 62.3 kg (137 lb 5.6 oz) 63.4 kg (139 lb 12.4 oz)    Lab Results: Basic Metabolic Panel:  Recent Labs  25/95/63 0520 01/06/13 0430  NA 133* 130*  K 4.1 4.1  CL 101 101  CO2 22 24  GLUCOSE 99 125*  BUN 20 20  CREATININE 0.62 0.56    CBC:  Recent Labs  01/04/13 0520 01/06/13 0430  WBC 13.6* 16.1*  HGB 12.2 11.2*  HCT 35.6* 32.7*  MCV 87.7 88.1  PLT 217 241    BNP    Component Value Date/Time   PROBNP 111.0* 06/08/2011 1440    PROTIME: Lab Results  Component Value Date   INR 1.08 09/19/2012    Telemetry: Currently sinus rhythm  Assessment/Plan:  1. Nonischemic cardiomyopathy 2. Biventricular paced rhythm 3. Stable postoperative state  Recommendations:  Her volume status appears stable at the present time. May advance treatment. She is currently off of phenylephrine.   Darden Palmer  MD [facility]  01/06/2013, 8:12 AM

## 2013-01-07 DIAGNOSIS — E440 Moderate protein-calorie malnutrition: Secondary | ICD-10-CM

## 2013-01-07 LAB — CBC
HCT: 31.5 % — ABNORMAL LOW (ref 36.0–46.0)
Hemoglobin: 11 g/dL — ABNORMAL LOW (ref 12.0–15.0)
MCV: 88 fL (ref 78.0–100.0)
RDW: 12.9 % (ref 11.5–15.5)
WBC: 10.7 10*3/uL — ABNORMAL HIGH (ref 4.0–10.5)

## 2013-01-07 LAB — GLUCOSE, CAPILLARY: Glucose-Capillary: 112 mg/dL — ABNORMAL HIGH (ref 70–99)

## 2013-01-07 MED ORDER — TRACE MINERALS CR-CU-F-FE-I-MN-MO-SE-ZN IV SOLN
INTRAVENOUS | Status: DC
  Administered 2013-01-07: 17:00:00 via INTRAVENOUS
  Filled 2013-01-07: qty 2000

## 2013-01-07 MED ORDER — FLUCONAZOLE 150 MG PO TABS
150.0000 mg | ORAL_TABLET | Freq: Once | ORAL | Status: AC
  Administered 2013-01-07: 150 mg via ORAL
  Filled 2013-01-07: qty 1

## 2013-01-07 MED ORDER — FAT EMULSION 20 % IV EMUL
250.0000 mL | INTRAVENOUS | Status: DC
  Administered 2013-01-07: 250 mL via INTRAVENOUS
  Filled 2013-01-07: qty 250

## 2013-01-07 MED ORDER — DOCUSATE SODIUM 100 MG PO CAPS
100.0000 mg | ORAL_CAPSULE | Freq: Two times a day (BID) | ORAL | Status: DC
  Administered 2013-01-07 (×2): 100 mg via ORAL
  Filled 2013-01-07 (×4): qty 1

## 2013-01-07 MED ORDER — BISACODYL 10 MG RE SUPP
10.0000 mg | Freq: Once | RECTAL | Status: AC
  Administered 2013-01-07: 10 mg via RECTAL
  Filled 2013-01-07: qty 1

## 2013-01-07 NOTE — Progress Notes (Signed)
[facility] PROGRESS NOTE CONSULT  [name] AVW:[id number] DOB: [birth year] DOA: 12/22/2012 PCP: Kimber Relic, MD  Assessment/Plan:  Hospital-acquired pneumonia  -will Continue vancomycin and aztreonam to complete 8 days, started on 10/26>> to be dc 11/3 am  -Blood with no growth to date  -Strep pneumonia Antigen neg. -clinically much improved, leukocytosis resolved  Hypotension -post-op- anesthesia likely contributing factor as discussed with surgery -Patient was on Neo-Synephrine, and cardiology decreased metoprolol dose on 10/31 -Resolved, weaned off Neo-Synephrine 11/1  Hypertension -BP elevated this am, if persistent metoprolol dose may need to be increased back (to 5)   Leukocytosis  -Continue antibiotics for pneumonia as above -Patient is status post exploratory lap. And surgery no evidence of ischemic bowel infection -Trended up initially postop, but almost resolved and pt hemodynamically stable  Protein caloric malnutrition  -on TNA per surgery  Wide-Complex Tachycardia -As per cardiology.  Femoral hernia, recurrent  - Status post exploratory lap. 10/30 with SBR and repair of recurrent femoral hernia -Per surgery  Code Status: Full Code Family Communication: Patient only  Disposition Plan: To be determined   Antibiotics:  Vancomycin 12/31/2012-->  Aztreonam 12/31/2012-->   Subjective: -sitting up in chair, states she feels better overall today  Objective: Filed Vitals:   01/07/13 0000 01/07/13 0306 01/07/13 0800 01/07/13 0806  BP: 144/52 178/60 187/73   Pulse: 65 65 64   Temp: 98.1 F (36.7 [name]) 98.3 F (36.8 [name]) 98.5 F (36.9 [name])   TempSrc: Oral Oral Oral   Resp: 15 15 16 10   Height:      Weight:      SpO2: 97% 96% 98% 97%    Intake/Output Summary (Last 24 hours) at 01/07/13 0910 Last data filed at 01/07/13 0900  Gross per 24 hour  Intake   3262 ml  Output   3025 ml  Net    237 ml   Filed Weights   12/26/12 1656 01/04/13  2033 01/06/13 0400  Weight: 62.8 kg (138 lb 7.2 oz) 62.3 kg (137 lb 5.6 oz) 63.4 kg (139 lb 12.4 oz)    Exam:   General:  Alert and appropriate, NG out  Respiratory: decreased BS at bases, no wheezes  Abdomen: soft, decreased BS , incision clean and dry  Extremities: no [name]/[name]/E   Neurologic:  Grossly intact and non-focal  Data Reviewed: Basic Metabolic Panel:  Recent Labs Lab 01/01/13 0614 01/02/13 0455 01/03/13 0625 01/04/13 0520 01/06/13 0430  NA 139 132* 133* 133* 130*  K 3.4* 3.6 3.4* 4.1 4.1  CL 108 100 103 101 101  CO2 22 23 23 22 24   GLUCOSE 143* 117* 116* 99 125*  BUN 17 14 18 20 20   CREATININE 0.65 0.57 0.59 0.62 0.56  CALCIUM 8.5 8.1* 8.2* 8.6 8.3*  MG 1.6 1.8 1.8 1.9  --   PHOS 2.0* 2.9 4.2 4.2  --    Liver Function Tests:  Recent Labs Lab 01/01/13 0614 01/04/13 0520  AST 16 26  ALT 17 22  ALKPHOS 74 88  BILITOT 0.3 0.3  PROT 5.2* 5.7*  ALBUMIN 2.3* 2.2*   No results found for this basename: LIPASE, AMYLASE,  in the last 168 hours No results found for this basename: AMMONIA,  in the last 168 hours CBC:  Recent Labs Lab 01/01/13 0614 01/02/13 0455 01/04/13 0520 01/06/13 0430 01/07/13 0300  WBC 24.2* 21.1* 13.6* 16.1* 10.7*  NEUTROABS 21.0*  --   --   --   --   HGB 12.5  12.6 12.2 11.2* 11.0*  HCT 36.9 36.4 35.6* 32.7* 31.5*  MCV 88.7 86.9 87.7 88.1 88.0  PLT 148* 165 217 241 278   Cardiac Enzymes: No results found for this basename: CKTOTAL, CKMB, CKMBINDEX, TROPONINI,  in the last 168 hours BNP (last 3 results) No results found for this basename: PROBNP,  in the last 8760 hours CBG:  Recent Labs Lab 01/06/13 0759 01/06/13 1243 01/06/13 1533 01/06/13 2333 01/07/13 0747  GLUCAP 128* 94 100* 97 112*    Recent Results (from the past 240 hour(s))  CULTURE, BLOOD (ROUTINE X 2)     Status: None   Collection Time    12/31/12 12:00 PM      Result Value Range Status   Specimen Description BLOOD RIGHT ARM   Final   Special  Requests BOTTLES DRAWN AEROBIC AND ANAEROBIC 2 CC EA   Final   Culture  Setup Time     Final   Value: 12/31/2012 17:46     Performed at [facility]   Culture     Final   Value: NO GROWTH 5 DAYS     Performed at [facility]   Report Status 01/06/2013 FINAL   Final  CULTURE, BLOOD (ROUTINE X 2)     Status: None   Collection Time    12/31/12 12:15 PM      Result Value Range Status   Specimen Description BLOOD LEFT HAND   Final   Special Requests BOTTLES DRAWN AEROBIC AND ANAEROBIC 2 CC EA   Final   Culture  Setup Time     Final   Value: 12/31/2012 17:46     Performed at [facility]   Culture     Final   Value: NO GROWTH 5 DAYS     Performed at [facility]   Report Status 01/06/2013 FINAL   Final  CULTURE, EXPECTORATED SPUTUM-ASSESSMENT     Status: None   Collection Time    01/01/13  5:52 AM      Result Value Range Status   Specimen Description SPUTUM   Final   Special Requests NONE   Final   Sputum evaluation     Final   Value: THIS SPECIMEN IS ACCEPTABLE. RESPIRATORY CULTURE REPORT TO FOLLOW.   Report Status 01/01/2013 FINAL   Final  CULTURE, RESPIRATORY (NON-EXPECTORATED)     Status: None   Collection Time    01/01/13  5:52 AM      Result Value Range Status   Specimen Description SPUTUM   Final   Special Requests NONE   Final   Gram Stain     Final   Value: ABUNDANT WBC PRESENT, PREDOMINANTLY PMN     RARE SQUAMOUS EPITHELIAL CELLS PRESENT     RARE GRAM POSITIVE COCCI     IN PAIRS RARE GRAM NEGATIVE RODS     Performed at [facility]   Culture     Final   Value: NORMAL OROPHARYNGEAL FLORA     Performed at [facility]   Report Status 01/03/2013 FINAL   Final  MRSA PCR SCREENING     Status: None   Collection Time    01/04/13  8:45 PM      Result Value Range Status   MRSA by PCR NEGATIVE  NEGATIVE Final   Comment:            The GeneXpert MRSA Assay (FDA     approved for NASAL specimens  only), is one component  of a     comprehensive MRSA colonization     surveillance program. It is not     intended to diagnose MRSA     infection nor to guide or     monitor treatment for     MRSA infections.     Studies: No results found.  Scheduled Meds: . aztreonam  1 g Intravenous Q8H  . digoxin  0.125 mg Intravenous Daily  . docusate sodium  100 mg Oral BID  . famotidine (PEPCID) IV  20 mg Intravenous Q12H  . fluconazole  150 mg Oral Once  . insulin aspart  0-9 Units Subcutaneous Q8H  . levothyroxine  50 mcg Intravenous Daily  . metoprolol  2.5 mg Intravenous Q6H  . morphine   Intravenous Q4H  . sodium chloride  10-40 mL Intracatheter Q12H  . vancomycin  1,000 mg Intravenous Q12H   Continuous Infusions: . Marland KitchenTPN (CLINIMIX-E) Adult 75 mL/hr at 01/06/13 1900   And  . fat emulsion 250 mL (01/06/13 1800)  . Marland KitchenTPN (CLINIMIX-E) Adult     And  . fat emulsion    . phenylephrine (NEO-SYNEPHRINE) Adult infusion Stopped (01/05/13 1830)    Principal Problem:   Femoral hernia with gangrene and obstruction Active Problems:   Unspecified intestinal obstruction   Malnutrition of moderate degree   Moderate malnutrition   HCAP (healthcare-associated pneumonia)   Leukocytosis, unspecified    Time spent: 25 minutes    [name]  [facility] Pager [phone]  If 7PM-7AM, please contact night-coverage at [email or web address], password [facility] 01/07/2013, 9:10 AM  LOS: 16 days

## 2013-01-07 NOTE — Progress Notes (Signed)
Subjective:  No complaints of shortness of breath or chest pain at the present time. Asking when she can go home.  Objective:  Vital Signs in the last 24 hours: BP 178/60  Pulse 65  Temp(Src) 98.3 F (36.8 C) (Oral)  Resp 15  Ht 5\' 8"  (1.727 m)  Wt 63.4 kg (139 lb 12.4 oz)  BMI 21.26 kg/m2  SpO2 96%  Physical Exam: Thin pleasant elderly female in no acute distress, NG-tube in place Lungs:  Clear  Cardiac:  Regular rhythm, normal S1 and S2, no S3 Extremities:  No edema present  Intake/Output from previous day: 11/01 0701 - 11/02 0700 In: 2890 [I.V.:301; NG/GT:30; IV Piggyback:650; TPN:1909] Out: 2820 [Urine:2650; Emesis/NG output:150; Drains:20] Weight Filed Weights   12/26/12 1656 01/04/13 2033 01/06/13 0400  Weight: 62.8 kg (138 lb 7.2 oz) 62.3 kg (137 lb 5.6 oz) 63.4 kg (139 lb 12.4 oz)    Lab Results: Basic Metabolic Panel:  Recent Labs  57/84/69 0430  NA 130*  K 4.1  CL 101  CO2 24  GLUCOSE 125*  BUN 20  CREATININE 0.56    CBC:  Recent Labs  01/06/13 0430 01/07/13 0300  WBC 16.1* 10.7*  HGB 11.2* 11.0*  HCT 32.7* 31.5*  MCV 88.1 88.0  PLT 241 278    BNP    Component Value Date/Time   PROBNP 111.0* 06/08/2011 1440    PROTIME: Lab Results  Component Value Date   INR 1.08 09/19/2012    Telemetry: Paced rhythm  Assessment/Plan:  1. Nonischemic cardiomyopathy 2. Biventricular paced rhythm 3. Stable postoperative state 4. Improvement in nonsustained ventricular tachycardia  Recommendations:  She appears to be euvolemic at the present time. Slowly recovering from recent surgery. Continue to monitor.   Darden Palmer  MD [facility]  01/07/2013, 7:59 AM

## 2013-01-07 NOTE — Progress Notes (Signed)
PARENTERAL NUTRITION CONSULT NOTE   Pharmacy Consult for TNA Indication: Ileus  Allergies  Allergen Reactions  . Hydrocodone Itching  . Cephalexin Itching and Swelling  . Doxycycline Other (See Comments)    Per pt: unknown  . Ofloxacin Other (See Comments)    Per pt: unknown  . Tape Other (See Comments)    Burns skin.   . Latex Rash    Patient Measurements: Height: 5\' 8"  (172.7 cm) Weight: 139 lb 12.4 oz (63.4 kg) IBW/kg (Calculated) : 63.9  Vital Signs: Temp: 98.5 F (36.9 C) (11/02 0800) Temp src: Oral (11/02 0800) BP: 178/60 mmHg (11/02 0306) Pulse Rate: 65 (11/02 0306) Intake/Output from previous day: 11/01 0701 - 11/02 0700 In: 2890 [I.V.:301; NG/GT:30; IV Piggyback:650; TPN:1909] Out: 2820 [Urine:2650; Emesis/NG output:150; Drains:20] Intake/Output from this shift: Total I/O In: 200 [IV Piggyback:200] Out: 255 [Urine:225; Emesis/NG output:30]  Labs:  Recent Labs  01/06/13 0430 01/07/13 0300  WBC 16.1* 10.7*  HGB 11.2* 11.0*  HCT 32.7* 31.5*  PLT 241 278     Recent Labs  01/06/13 0430  NA 130*  K 4.1  CL 101  CO2 24  GLUCOSE 125*  BUN 20  CREATININE 0.56  CALCIUM 8.3*   Estimated Creatinine Clearance: 58 ml/min (by C-G formula based on Cr of 0.56).    Recent Labs  01/06/13 1533 01/06/13 2333 01/07/13 0747  GLUCAP 100* 97 112*    Medical History: Past Medical History  Diagnosis Date  . Nonischemic cardiomyopathy     EF initially 20%; Last measurement up to 50% per echo in August of 2012  . CHF (congestive heart failure)   . LBBB (left bundle branch block)   . Mitral regurgitation   . Melanoma     right arm  . Hypothyroidism   . Erosive gastritis 08/2004  . Hyperplastic polyps of stomach 06/2002  . Barrett's esophagus 04/2008  . GERD (gastroesophageal reflux disease)   . Diverticulitis   . Anxiety disorder   . HTN (hypertension)   . ICD (implantable cardiac defibrillator) in place     BiV/ICD; s/p removal of ICD with  insertion of BiV PPM 04/26/12  . Hair thinning   . Unspecified sinusitis (chronic)   . Unspecified chronic bronchitis   . Depressive disorder, not elsewhere classified   . Restless legs syndrome (RLS)   . Chronic airway obstruction, not elsewhere classified   . Benign neoplasm of colon   . Other and unspecified hyperlipidemia   . Unspecified nasal polyp   . Pain in joint, lower leg   . Synovial cyst of popliteal space   . Complication of anesthesia     slow to wake up  . Pacemaker 04/26/2012  . Sleep apnea   . Other and unspecified hyperlipidemia   . Dysthymic disorder   . Palpitations     Medications:  Scheduled:  . aztreonam  1 g Intravenous Q8H  . digoxin  0.125 mg Intravenous Daily  . docusate sodium  100 mg Oral BID  . famotidine (PEPCID) IV  20 mg Intravenous Q12H  . insulin aspart  0-9 Units Subcutaneous Q8H  . levothyroxine  50 mcg Intravenous Daily  . metoprolol  2.5 mg Intravenous Q6H  . morphine   Intravenous Q4H  . sodium chloride  10-40 mL Intracatheter Q12H  . vancomycin  1,000 mg Intravenous Q12H   Infusions:  . Marland KitchenTPN (CLINIMIX-E) Adult 75 mL/hr at 01/06/13 1900   And  . fat emulsion 250 mL (01/06/13 1800)  .  phenylephrine (NEO-SYNEPHRINE) Adult infusion Stopped (01/05/13 1830)    Current Nutrition:  11/2 start CLD, d/c NG tube   Nutritional Goals:  Per RD 10/24:  Kcal: 1650-1900  Protein: 75-90 g  Fluid: >1.7 L  Clinimix E 5/15 at goal rate of 75 ml/hr + 20% Lipids at 45ml/hr will provide 90g protein, 1656 kcal per day  NS + KCl at [facility]  Assessment: 79 yo female s/p small bowel resection and tissue repair of right femoral hernia on 10/20. Diet advanced to clears on 10/24 but had significant abdominal pain, nausea and vomiting, 10/26 abd film revealed dilated small bowel loops suspicious for SBO. Patient NPO, order to place PICC and begin TNA on 10/25. Initially (Per IV team), patient refused PICC placement on 10/25, but was agreeable to  placement 10/26.  S/p ex lap on 10/30 with femoral hernia repair with small bowel resection for ischemic bowel with gangrene. Neo gtt off now.  Small amount of flatus, tolerating NG tube clamping with minimal residual.   11/2: Day #8 TPN.  S/p Ex lap on 10/30 with femoral hernia repair with small bowel resection for ischemic bowel.  Discontinue NG tube today and start CLD, transferring to floor.    Glucose: CBGs at goal of < 150, 1 unit SSI insulin required/24h  Labs from 11/1:  Electrolytes: Na = 130 (suspect from TPN, unable to adjust in premixed TPN product), other BMET labs  WNL, mg/phos on Monday  Renal function: wnl Hepatic function: wnl  10/30 TG: 89 (10/27) Prealb: low, 8.6 (10/23), 5.1 (10/27)  Plan:   Continue Clinimix E 5/15 at goal 75 ml/hr as tolerating TPN   F/u progress with CLD and wean of TPN as diet advances  20% fat emulsion at 8 ml/hr (to keep fat calories to <30% total kcals).  TNA to contain standard multivitamins and trace elements.  Continue CBG/SSI q8h  TNA lab panels on Mondays & Thursdays.   Anthany Thornhill, Loma Messing PharmD Pager #: [phone] 8:40 AM 01/07/2013

## 2013-01-07 NOTE — Progress Notes (Signed)
Femoral hernia with gangrene and obstruction S/p Ing hernia repair followed by femoral hernia repair/SBR, followed by repeat femoral hernia repair internally with repeat SBR  Subjective: Doing better, still having small amt of flatus, tolerated clamping NG with min residual.  Min output overnight.  Good UOP after foley out  Objective: Vital signs in last 24 hours: Temp:  [97.6 F (36.4 C)-98.4 F (36.9 C)] 98.3 F (36.8 C) (11/02 0306) Pulse Rate:  [65-76] 65 (11/02 0306) Resp:  [13-24] 15 (11/02 0306) BP: (131-178)/(52-91) 178/60 mmHg (11/02 0306) SpO2:  [94 %-100 %] 96 % (11/02 0306) Last BM Date: 01/03/13  Intake/Output from previous day: 11/01 0701 - 11/02 0700 In: 2890 [I.V.:301; NG/GT:30; IV Piggyback:650; TPN:1909] Out: 2820 [Urine:2650; Emesis/NG output:150; Drains:20] Intake/Output this shift:    General appearance: alert and cooperative GI: soft, appropriately tender, non-distended Incision/Wound: clean, dry, intact  Lab Results:  Results for orders placed during the hospital encounter of 12/22/12 (from the past 24 hour(s))  GLUCOSE, CAPILLARY     Status: Abnormal   Collection Time    01/06/13  7:59 AM      Result Value Range   Glucose-Capillary 128 (*) 70 - 99 mg/dL   Comment 1 Documented in Chart     Comment 2 Notify RN    GLUCOSE, CAPILLARY     Status: None   Collection Time    01/06/13 12:43 PM      Result Value Range   Glucose-Capillary 94  70 - 99 mg/dL   Comment 1 Documented in Chart     Comment 2 Notify RN    GLUCOSE, CAPILLARY     Status: Abnormal   Collection Time    01/06/13  3:33 PM      Result Value Range   Glucose-Capillary 100 (*) 70 - 99 mg/dL  GLUCOSE, CAPILLARY     Status: None   Collection Time    01/06/13 11:33 PM      Result Value Range   Glucose-Capillary 97  70 - 99 mg/dL  CBC     Status: Abnormal   Collection Time    01/07/13  3:00 AM      Result Value Range   WBC 10.7 (*) 4.0 - 10.5 K/uL   RBC 3.58 (*) 3.87 - 5.11 MIL/uL    Hemoglobin 11.0 (*) 12.0 - 15.0 g/dL   HCT 16.1 (*) 09.6 - 04.5 %   MCV 88.0  78.0 - 100.0 fL   MCH 30.7  26.0 - 34.0 pg   MCHC 34.9  30.0 - 36.0 g/dL   RDW 40.9  81.1 - 91.4 %   Platelets 278  150 - 400 K/uL     Studies/Results Radiology     MEDS, Scheduled . aztreonam  1 g Intravenous Q8H  . bisacodyl  10 mg Rectal Once  . digoxin  0.125 mg Intravenous Daily  . docusate sodium  100 mg Oral BID  . famotidine (PEPCID) IV  20 mg Intravenous Q12H  . insulin aspart  0-9 Units Subcutaneous Q8H  . levothyroxine  50 mcg Intravenous Daily  . metoprolol  2.5 mg Intravenous Q6H  . morphine   Intravenous Q4H  . sodium chloride  10-40 mL Intracatheter Q12H  . vancomycin  1,000 mg Intravenous Q12H     Assessment: Femoral hernia with gangrene and obstruction Ileus resolving  Plan: Cont TPN Will d/c NG today and start clears Transfer to floor tele bed Suppository to stimulate bowel function Ambulate    LOS: 16  days    Vanita Panda, MD [facility], [name] [phone]   01/07/2013 7:50 AM

## 2013-01-08 ENCOUNTER — Encounter (HOSPITAL_COMMUNITY): Payer: Self-pay | Admitting: Surgery

## 2013-01-08 LAB — DIFFERENTIAL
Basophils Relative: 0 % (ref 0–1)
Eosinophils Absolute: 0.2 10*3/uL (ref 0.0–0.7)
Eosinophils Relative: 2 % (ref 0–5)
Lymphocytes Relative: 12 % (ref 12–46)
Lymphs Abs: 1 10*3/uL (ref 0.7–4.0)
Monocytes Relative: 9 % (ref 3–12)

## 2013-01-08 LAB — TRIGLYCERIDES: Triglycerides: 52 mg/dL (ref <150)

## 2013-01-08 LAB — COMPREHENSIVE METABOLIC PANEL
ALT: 83 U/L — ABNORMAL HIGH (ref 0–35)
AST: 82 U/L — ABNORMAL HIGH (ref 0–37)
Albumin: 1.6 g/dL — ABNORMAL LOW (ref 3.5–5.2)
Alkaline Phosphatase: 117 U/L (ref 39–117)
GFR calc Af Amer: >90 mL/min (ref >90)
Glucose, Bld: 106 mg/dL — ABNORMAL HIGH (ref 70–99)
Potassium: 3.6 mEq/L (ref 3.5–5.1)
Sodium: 135 mEq/L (ref 135–145)
Total Protein: 4.8 g/dL — ABNORMAL LOW (ref 6.0–8.3)

## 2013-01-08 LAB — GLUCOSE, CAPILLARY
Glucose-Capillary: 117 mg/dL — ABNORMAL HIGH (ref 70–99)
Glucose-Capillary: 108 mg/dL — ABNORMAL HIGH (ref 70–99)
Glucose-Capillary: 118 mg/dL — ABNORMAL HIGH (ref 70–99)
Glucose-Capillary: 89 mg/dL (ref 70–99)

## 2013-01-08 LAB — CBC
HCT: 29.4 % — ABNORMAL LOW (ref 36.0–46.0)
Hemoglobin: 10.4 g/dL — ABNORMAL LOW (ref 12.0–15.0)
MCH: 31.2 pg (ref 26.0–34.0)
MCHC: 35.4 g/dL (ref 30.0–36.0)
MCV: 88.3 fL (ref 78.0–100.0)
Platelets: 343 10*3/uL (ref 150–400)
RBC: 3.33 MIL/uL — ABNORMAL LOW (ref 3.87–5.11)
WBC: 8.3 10*3/uL (ref 4.0–10.5)

## 2013-01-08 MED ORDER — ALUM & MAG HYDROXIDE-SIMETH 200-200-20 MG/5ML PO SUSP: 30.0000 mL | Freq: Four times a day (QID) | ORAL | Status: DC | PRN

## 2013-01-08 MED ORDER — FAMOTIDINE 20 MG PO TABS
20.0000 mg | ORAL_TABLET | Freq: Two times a day (BID) | ORAL | Status: DC
  Administered 2013-01-08 – 2013-01-10 (×5): 20 mg via ORAL
  Filled 2013-01-08 (×6): qty 1

## 2013-01-08 MED ORDER — FAT EMULSION 20 % IV EMUL: 250.0000 mL | INTRAVENOUS | Status: DC

## 2013-01-08 MED ORDER — SACCHAROMYCES BOULARDII 250 MG PO CAPS
250.0000 mg | ORAL_CAPSULE | Freq: Two times a day (BID) | ORAL | Status: DC
  Administered 2013-01-08 – 2013-01-10 (×5): 250 mg via ORAL
  Filled 2013-01-08 (×6): qty 1

## 2013-01-08 MED ORDER — TRACE MINERALS CR-CU-F-FE-I-MN-MO-SE-ZN IV SOLN
INTRAVENOUS | Status: DC
  Filled 2013-01-08: qty 1000

## 2013-01-08 MED ORDER — ENSURE COMPLETE PO LIQD: 237.0000 mL | ORAL | Status: DC

## 2013-01-08 MED ORDER — SODIUM CHLORIDE 0.9 % IJ SOLN
10.0000 mL | INTRAMUSCULAR | Status: DC | PRN
  Administered 2013-01-08 – 2013-01-09 (×2): 10 mL

## 2013-01-08 MED ORDER — BISACODYL 10 MG RE SUPP: 10.0000 mg | Freq: Two times a day (BID) | RECTAL | Status: DC | PRN

## 2013-01-08 MED ORDER — MAGIC MOUTHWASH
15.0000 mL | Freq: Four times a day (QID) | ORAL | Status: DC | PRN
  Filled 2013-01-08: qty 15

## 2013-01-08 MED ORDER — BOOST / RESOURCE BREEZE PO LIQD
1.0000 | Freq: Three times a day (TID) | ORAL | Status: DC
  Administered 2013-01-08 (×2): 1 via ORAL

## 2013-01-08 MED ORDER — ADULT MULTIVITAMIN W/MINERALS CH
1.0000 | ORAL_TABLET | Freq: Every day | ORAL | Status: DC
  Administered 2013-01-08 – 2013-01-10 (×3): 1 via ORAL
  Filled 2013-01-08 (×3): qty 1

## 2013-01-08 MED ORDER — POTASSIUM CHLORIDE CRYS ER 20 MEQ PO TBCR
20.0000 meq | EXTENDED_RELEASE_TABLET | Freq: Once | ORAL | Status: AC
  Administered 2013-01-08: 20 meq via ORAL
  Filled 2013-01-08: qty 1

## 2013-01-08 MED ORDER — BOOST / RESOURCE BREEZE PO LIQD
1.0000 | ORAL | Status: DC
  Administered 2013-01-09: 1 via ORAL

## 2013-01-08 MED ORDER — METOPROLOL TARTRATE 12.5 MG HALF TABLET
12.5000 mg | ORAL_TABLET | Freq: Two times a day (BID) | ORAL | Status: DC
  Filled 2013-01-08 (×2): qty 1

## 2013-01-08 MED ORDER — METOPROLOL TARTRATE 12.5 MG HALF TABLET
12.5000 mg | ORAL_TABLET | Freq: Two times a day (BID) | ORAL | Status: DC
  Administered 2013-01-08 – 2013-01-10 (×4): 12.5 mg via ORAL
  Filled 2013-01-08 (×6): qty 1

## 2013-01-08 MED ORDER — CLINIMIX E/DEXTROSE (5/15) 5 % IV SOLN
INTRAVENOUS | Status: AC
  Administered 2013-01-08: 18:00:00 via INTRAVENOUS
  Filled 2013-01-08: qty 1000

## 2013-01-08 MED ORDER — LIP MEDEX EX OINT
1.0000 "application " | TOPICAL_OINTMENT | Freq: Two times a day (BID) | CUTANEOUS | Status: DC
  Administered 2013-01-08 – 2013-01-10 (×5): 1 via TOPICAL
  Filled 2013-01-08 (×2): qty 7

## 2013-01-08 MED ORDER — MORPHINE SULFATE 2 MG/ML IJ SOLN: 2.0000 mg | INTRAMUSCULAR | Status: DC | PRN

## 2013-01-08 MED ORDER — METOPROLOL TARTRATE 1 MG/ML IV SOLN: 2.5000 mg | Freq: Four times a day (QID) | INTRAVENOUS | Status: DC | PRN

## 2013-01-08 MED ORDER — ACETAMINOPHEN 500 MG PO TABS
1000.0000 mg | ORAL_TABLET | Freq: Three times a day (TID) | ORAL | Status: DC
  Administered 2013-01-08 (×3): 1000 mg via ORAL
  Administered 2013-01-09 (×3): 500 mg via ORAL
  Administered 2013-01-10: 1000 mg via ORAL
  Filled 2013-01-08 (×9): qty 2

## 2013-01-08 MED ORDER — FAT EMULSION 20 % IV EMUL
120.0000 mL | INTRAVENOUS | Status: AC
  Administered 2013-01-08: 120 mL via INTRAVENOUS
  Filled 2013-01-08: qty 200

## 2013-01-08 MED ORDER — PSYLLIUM 95 % PO PACK
1.0000 | PACK | Freq: Two times a day (BID) | ORAL | Status: DC
  Administered 2013-01-08 (×2): 1 via ORAL
  Filled 2013-01-08 (×6): qty 1

## 2013-01-08 MED ORDER — LEVOTHYROXINE SODIUM 112 MCG PO TABS
112.0000 ug | ORAL_TABLET | Freq: Every day | ORAL | Status: DC
  Administered 2013-01-09 – 2013-01-10 (×2): 112 ug via ORAL
  Filled 2013-01-08 (×4): qty 1

## 2013-01-08 MED ORDER — TRACE MINERALS CR-CU-F-FE-I-MN-MO-SE-ZN IV SOLN: INTRAVENOUS | Status: DC

## 2013-01-08 MED ORDER — OXYCODONE HCL 5 MG PO TABS
5.0000 mg | ORAL_TABLET | ORAL | Status: DC | PRN
Start: 2013-01-08 — End: 2013-01-10

## 2013-01-08 MED ORDER — FAT EMULSION 20 % IV EMUL
120.0000 mL | INTRAVENOUS | Status: DC
  Filled 2013-01-08: qty 200

## 2013-01-08 NOTE — Progress Notes (Signed)
    Subjective:  No chest pain. Breathing unchanged with mild shortness of breath. C/o weakness.   Objective:  Vital Signs in the last 24 hours: Temp:  [98.4 F (36.9 C)-99.2 F (37.3 C)] 98.4 F (36.9 C) (11/03 1402) Pulse Rate:  [56-68] 56 (11/03 1402) Resp:  [16-23] 18 (11/03 1402) BP: (120-167)/(50-64) 138/59 mmHg (11/03 1402) SpO2:  [93 %-99 %] 97 % (11/03 1402)  Intake/Output from previous day: 11/02 0701 - 11/03 0700 In: 4116 [P.O.:1200; I.V.:274; IV Piggyback:650; TPN:1992] Out: 265 [Urine:225; Emesis/NG output:30; Drains:10]  Physical Exam: Pt is alert and oriented, elderly woman in NAD HEENT: normal Neck: JVP - normal, carotids 2+= without bruits Lungs: CTA bilaterally CV: RRR with soft SEM at the LSB Ext: no C/C/E, distal pulses intact and equal Skin: warm/dry no rash  Lab Results:  Recent Labs  01/07/13 0300 01/08/13 0455  WBC 10.7* 8.3  HGB 11.0* 10.4*  PLT 278 343    Recent Labs  01/06/13 0430 01/08/13 0455  NA 130* 135  K 4.1 3.6  CL 101 102  CO2 24 27  GLUCOSE 125* 106*  BUN 20 16  CREATININE 0.56 0.49*   No results found for this basename: TROPONINI, CK, MB,  in the last 72 hours  Tele: Paced rhythm  Assessment/Plan:  1. Nonischemic cardiomyopathy  2. Biventricular paced rhythm  3. Stable postoperative state  4. Improvement in nonsustained ventricular tachycardia  Tele reviewed and rhythm appears stable. Pt tolerating digoxin and metoprolol without problems. Overall appears stable from cardiac perspective.   Tonny Bollman, M.D. 01/08/2013, 6:20 PM

## 2013-01-08 NOTE — Progress Notes (Signed)
OT Cancellation Note  Patient Details Name: MARWA FUHRMAN MRN: [id number] DOB: 09-Mar-1932   Cancelled Treatment:    Reason Eval/Treat Not Completed: Fatigue/lethargy limiting ability to participate  Quinzell Malcomb 01/08/2013, 1:14 PM Marica Otter, OTR/L [phone] 01/08/2013

## 2013-01-08 NOTE — Progress Notes (Signed)
NUTRITION FOLLOW UP  Intervention:   TPN wean per Pharmacy Provide Resource Breeze once daily; provides 250 kcal and 9 grams protein Ensure Complete po once daily, each supplement provides 350 kcal and 13 grams of protein. Recommend daily weights RD will continue to monitor nutrition plan of care.   Nutrition Dx:   Inadequate oral intake related to inability to eat as evidenced by NPO, ongoing.   Goal:   Patient will meet >/=90% of estimated nutrition needs; being met by TPN  Monitor:   Weight: weight stable Diet advancement: advanced to full liquids 11/3 Labs: blood glucose 94-143; potassium, magnesium, and phosphorus WNL; low albumin  Assessment:   Patient is s/p small bowel resection and tissue repair of right femoral hernia on 10/20. Pt's diet was advanced to clear liquids 10/22 and NG tube was removed, but had multiple episodes of vomiting, increase in abdominal pain. CCS suspects ileus. Patient currently NPO, TNA started 10/25. NG tube replaced today.   TNA: Clinimix 5/15 at goal rate of 75 ml/hr with 20% lipids at 8 ml/hr, which provides 1656 kcal, 90 g protein (100% estimated needs).  Pt's weight appears to be stable: Weight on 10/17-140 lbs, 10/21-138 lbs, 11/1- 139 lbs. Diet advanced to clear liquids 11/2 and full liquids today. Pt states she is tolerating full liquids; denies pain or nausea. Pt reports getting full fast, not able to eat much at this time but, expresses desire to eat toast and scrambled eggs. Pt up in chair at time of visit. Pt states she has not tried [employer] yet; willing to try to drink one [employer] and one Ensure Complete daily. Per Pharmacy note, plan to reduce Clinamix 5/15 to 40 ml/hr tonight with possible plan to wean pt off of TNA tomorrow. Encouraged pt to order 3 meals and eat as tolerated; encouraged continuation of nutritional supplements until PO intake improves.   Height: Ht Readings from Last 1 Encounters:  12/26/12 5\' 8"  (1.727 m)     Weight Status:   Wt Readings from Last 1 Encounters:  01/06/13 139 lb 12.4 oz (63.4 kg)    Re-estimated needs:  Kcal: 1650-1900 kcal Protein: 75-90 g  Fluid: 1.7 L/day  Skin: Abdominal and groin incisions  Diet Order: Full Liquid   Intake/Output Summary (Last 24 hours) at 01/08/13 1413 Last data filed at 01/08/13 1300  Gross per 24 hour  Intake   3330 ml  Output    860 ml  Net   2470 ml    Last BM: 11/2   Labs:   Recent Labs Lab 01/03/13 0625 01/04/13 0520 01/06/13 0430 01/08/13 0455  NA 133* 133* 130* 135  K 3.4* 4.1 4.1 3.6  CL 103 101 101 102  CO2 23 22 24 27   BUN 18 20 20 16   CREATININE 0.59 0.62 0.56 0.49*  CALCIUM 8.2* 8.6 8.3* 8.2*  MG 1.8 1.9  --  1.9  PHOS 4.2 4.2  --  4.4  GLUCOSE 116* 99 125* 106*    CBG (last 3)   Recent Labs  01/07/13 2159 01/08/13 0100 01/08/13 0733  GLUCAP 120* 117* 108*    Scheduled Meds: . acetaminophen  1,000 mg Oral TID  . digoxin  0.125 mg Intravenous Daily  . famotidine  20 mg Oral BID  . feeding supplement (RESOURCE BREEZE)  1 Container Oral TID BM  . insulin aspart  0-9 Units Subcutaneous Q8H  . [START ON 01/09/2013] levothyroxine  112 mcg Oral QAC breakfast  . lip balm  1 application Topical BID  . metoprolol tartrate  12.5 mg Oral BID  . multivitamin with minerals  1 tablet Oral Daily  . psyllium  1 packet Oral BID  . saccharomyces boulardii  250 mg Oral BID  . sodium chloride  10-40 mL Intracatheter Q12H    Continuous Infusions: . Marland KitchenTPN (CLINIMIX-E) Adult     And  . fat emulsion      Ian Malkin RD, LDN Inpatient Clinical Dietitian Pager: [phone] After Hours Pager: [phone]

## 2013-01-08 NOTE — Care Management Note (Addendum)
    Page 1 of 2   01/10/2013     2:18:36 PM   CARE MANAGEMENT NOTE 01/10/2013  Patient:  DAVYN, ELSASSER   Account Number:  [id number]  Date Initiated:  12/22/2012  Documentation initiated by:  DAVIS,RHONDA  Subjective/Objective Assessment:   pt with strangled femoral hernia and sbo/ watched in sdu overnight post op due to h6ypertensive crisis.     Action/Plan:   home when stable   Anticipated DC Date:  01/10/2013   Anticipated DC Plan:  SKILLED NURSING FACILITY  In-house referral  Clinical Social Worker         Choice offered to / List presented to:  C-1 Patient           Status of service:  Completed, signed off Medicare Important Message given?  NA - LOS <3 / Initial given by admissions (If response is "NO", the following Medicare IM given date fields will be blank) Date Medicare IM given:  01/09/2013 Date Additional Medicare IM given:    Discharge Disposition:  SKILLED NURSING FACILITY  Per UR Regulation:  Reviewed for med. necessity/level of care/duration of stay  If discussed at Long Length of Stay Meetings, dates discussed:   01/04/2013  01/09/2013    Comments:  01/10/13 Sally-Ann Cutbirth RN,BSN NCM [phone] D/C SNF.  01/09/13 Emmaline Wahba RN,BSN NCM [phone] PATIENT AGREES TO SNF WANTS [facility]. SPOKE TO PATIENT ABOUT DISPOSITION,& EXPLAINED THAT WE ASSIST W/DISPOSITION FROM ADMISSION SINCE ONCE MEDICALLY STABLE SHE IS NOT DELAYED ANOTHER DAY W/DISPOSITION,& PAYING OUT OF POCKET.PATIENT VOICED UNDERSTANDING.EXPLAINED THAT IF THERE IS A SNF BED AVAILABLE & NOT THE ONE SHE LIKES,SHE CAN CHOOSE TO D/C HOME,BUT CANNOT STAY IN THE HOSPITAL FOR ANOTHER BED TO BECOME AVAILABLE.PATIENT PREFERS [facility], BUT PER SW THEY WILL NOT ACCEPT.SW ASKED TO EXPLAIN TO PATIENT.EXPLAINED HHC SERVICES-INTERMITTENT,INSTRUCTION ON MEDS,DISEASE MGMNT,EXERCISES.PATIENT VOICED UNDERSTANDING.NOTIFIED PAC-MEGAN DORT OF D/C PLANS HOME W/HH.  01/08/13 Milos Milligan RN,BSN NCM [phone] TRANSFER  FROM STEPDOWN.POD#4 HERNIA REPAIR,ILEUS,IV ABX,TNA,CLEARS.D/C PLAN SNF.  01/05/2013 Colleen Can BSN RN CCM Conc review completed. Exp lap with sm bowel resection and closure of femoral hernia repair 10/30. Pt with NG/neo drip/iv abx/pca  morphine.  PT recommend SNF when stable.  [id number] Earlene Plater, RN, BSN, [place] [phone] Chart Reviewed for discharge and hospital needs. Discharge needs at time of review:  None Review of patient progress due on [id number].

## 2013-01-08 NOTE — Progress Notes (Signed)
Physical Therapy Treatment Patient Details Name: [name] MRN: [id number] DOB: [birth year] Today's Date: 01/08/2013 Time: 4098-1191 PT Time Calculation (min): 26 min  PT Assessment / Plan / Recommendation  History of Present Illness 79 yo female admitted 12/22/12. S/P explaoratory lap, SB resection, primary repair strangulated femoral hernia on 12/25/12. 01/04/13 underwent additonal exploratory laproscopic abdominal surgery.   PT Comments   Pt progressing; Did have LOB x 1 requiring min to recover; Pt states she wants to go home and is tired of being in the "facility environment"  Follow Up Recommendations  SNF;Supervision/Assistance - 24 hour;Home health PT (pt declining SNF at this time)     Does the patient have the potential to tolerate intense rehabilitation     Barriers to Discharge        Equipment Recommendations  None recommended by PT    Recommendations for Other Services    Frequency Min 3X/week   Progress towards PT Goals Progress towards PT goals: Progressing toward goals  Plan Current plan remains appropriate    Precautions / Restrictions Precautions Precautions: Fall   Pertinent Vitals/Pain sats 97-99% on RA; HR 55   Mobility  Bed Mobility Bed Mobility: Not assessed Transfers Transfers: Sit to Stand;Stand to Sit Sit to Stand: 4: Min guard;From chair/3-in-1;With upper extremity assist Stand to Sit: 4: Min guard;To chair/3-in-1;With upper extremity assist Details for Transfer Assistance: verbal cues for hand placement and safety Ambulation/Gait Ambulation/Gait Assistance: 4: Min guard Ambulation Distance (Feet): 220 Feet Assistive device: 1 person hand held assist;Other (Comment) (or IV push) Ambulation/Gait Assistance Details: cues for posture and step length Gait Pattern: Step-through pattern;Decreased stride length;Trunk flexed Gait velocity: decreased General Gait Details: LOB x 1 while returning to chair; min assist to recover    Exercises  General Exercises - Lower Extremity Quad Sets: AROM;Strengthening;Both;15 reps Gluteal Sets: AROM;Both;Strengthening;15 reps Hip ABduction/ADduction: 15 reps;Strengthening Toe Raises: AROM;Both;10 reps;Strengthening Heel Raises: AROM;Strengthening;Both;15 reps   PT Diagnosis:    PT Problem List:   PT Treatment Interventions:     PT Goals (current goals can now be found in the care plan section) Acute Rehab PT Goals Patient Stated Goal: to go home PT Goal Formulation: With patient Time For Goal Achievement: 01/19/13 Potential to Achieve Goals: Good  Visit Information  Last PT Received On: 01/08/13 Assistance Needed: +1 History of Present Illness: 79 yo female admitted 12/22/12. S/P explaoratory lap, SB resection, primary repair strangulated femoral hernia on 12/25/12. 01/04/13 underwent additonal exploratory laproscopic abdominal surgery.    Subjective Data  Subjective: I am better Patient Stated Goal: to go home   Cognition  Cognition Arousal/Alertness: Awake/alert Behavior During Therapy: WFL for tasks assessed/performed Overall Cognitive Status: Within Functional Limits for tasks assessed    Balance  Static Standing Balance Static Standing - Balance Support: No upper extremity supported Static Standing - Level of Assistance: 5: Stand by assistance  End of Session PT - End of Session Equipment Utilized During Treatment: Gait belt Activity Tolerance: Patient tolerated treatment well Patient left: in chair;with call bell/phone within reach Nurse Communication: Mobility status   GP     [facility] 01/08/2013, 1:24 PM

## 2013-01-08 NOTE — Progress Notes (Signed)
[facility] PROGRESS NOTE CONSULT  [name] WUJ:[id number] DOB: Aug 14, 1932 DOA: 12/22/2012 PCP: Kimber Relic, MD  Assessment/Plan:  Hospital-acquired pneumonia  -Has completed 8days of vancomycin and aztreonam to complete 8 days, started on (10/26>> 11/3) -Blood with no growth to date  -Strep pneumonia Antigen neg. -clinically much improved, leukocytosis resolved -she remains afebrile and hemodynamically stable TRH will s/o at this time, please call as needed Hypotension -post-op- anesthesia likely contributing factor as discussed with surgery -Patient was on Neo-Synephrine, and cardiology decreased metoprolol dose on 10/31 -Resolved, weaned off Neo-Synephrine 11/1  Hypertension -BP controlled  Leukocytosis  -Resolved s/p completion of abx as above  Protein caloric malnutrition  -on TNA per surgery  Wide-Complex Tachycardia -As per cardiology.  Femoral hernia, recurrent  - Status post exploratory lap. 10/30 with SBR and repair of recurrent femoral hernia -Per surgery  Code Status: Full Code Family Communication: Patient only  Disposition Plan: To be determined   Antibiotics:  Vancomycin 12/31/2012-->  Aztreonam 12/31/2012-->   Subjective: -sitting up in bed applying her make-up, denies any SOB, no cough.  Objective: Filed Vitals:   01/08/13 0532 01/08/13 0800 01/08/13 0949 01/08/13 1402  BP: 156/55  120/50 138/59  Pulse: 68  67 56  Temp: 99.2 F (37.3 C)   98.4 F (36.9 C)  TempSrc: Oral   Oral  Resp: 23 16  18   Height:      Weight:      SpO2: 93% 99% 98% 97%    Intake/Output Summary (Last 24 hours) at 01/08/13 1648 Last data filed at 01/08/13 1432  Gross per 24 hour  Intake 3383.27 ml  Output    850 ml  Net 2533.27 ml   Filed Weights   12/26/12 1656 01/04/13 2033 01/06/13 0400  Weight: 62.8 kg (138 lb 7.2 oz) 62.3 kg (137 lb 5.6 oz) 63.4 kg (139 lb 12.4 oz)    Exam:   General:  Alert and orientedx3  Respiratory:  decreased BS at bases, no wheezes  Abdomen: soft, decreased BS , incision clean and dry  Extremities: no C/C/E   Neurologic:  Grossly intact and non-focal  Data Reviewed: Basic Metabolic Panel:  Recent Labs Lab 01/02/13 0455 01/03/13 0625 01/04/13 0520 01/06/13 0430 01/08/13 0455  NA 132* 133* 133* 130* 135  K 3.6 3.4* 4.1 4.1 3.6  CL 100 103 101 101 102  CO2 23 23 22 24 27   GLUCOSE 117* 116* 99 125* 106*  BUN 14 18 20 20 16   CREATININE 0.57 0.59 0.62 0.56 0.49*  CALCIUM 8.1* 8.2* 8.6 8.3* 8.2*  MG 1.8 1.8 1.9  --  1.9  PHOS 2.9 4.2 4.2  --  4.4   Liver Function Tests:  Recent Labs Lab 01/04/13 0520 01/08/13 0455  AST 26 82*  ALT 22 83*  ALKPHOS 88 117  BILITOT 0.3 0.2*  PROT 5.7* 4.8*  ALBUMIN 2.2* 1.6*   No results found for this basename: LIPASE, AMYLASE,  in the last 168 hours No results found for this basename: AMMONIA,  in the last 168 hours CBC:  Recent Labs Lab 01/02/13 0455 01/04/13 0520 01/06/13 0430 01/07/13 0300 01/08/13 0455  WBC 21.1* 13.6* 16.1* 10.7* 8.3  NEUTROABS  --   --   --   --  6.3  HGB 12.6 12.2 11.2* 11.0* 10.4*  HCT 36.4 35.6* 32.7* 31.5* 29.4*  MCV 86.9 87.7 88.1 88.0 88.3  PLT 165 217 241 278 343   Cardiac Enzymes: No results found for this  basename: CKTOTAL, CKMB, CKMBINDEX, TROPONINI,  in the last 168 hours BNP (last 3 results) No results found for this basename: PROBNP,  in the last 8760 hours CBG:  Recent Labs Lab 01/07/13 1600 01/07/13 2159 01/08/13 0100 01/08/13 0733 01/08/13 1630  GLUCAP 94 120* 117* 108* 118*    Recent Results (from the past 240 hour(s))  CULTURE, BLOOD (ROUTINE X 2)     Status: None   Collection Time    12/31/12 12:00 PM      Result Value Range Status   Specimen Description BLOOD RIGHT ARM   Final   Special Requests BOTTLES DRAWN AEROBIC AND ANAEROBIC 2 CC EA   Final   Culture  Setup Time     Final   Value: 12/31/2012 17:46     Performed at [facility]   Culture      Final   Value: NO GROWTH 5 DAYS     Performed at [facility]   Report Status 01/06/2013 FINAL   Final  CULTURE, BLOOD (ROUTINE X 2)     Status: None   Collection Time    12/31/12 12:15 PM      Result Value Range Status   Specimen Description BLOOD LEFT HAND   Final   Special Requests BOTTLES DRAWN AEROBIC AND ANAEROBIC 2 CC EA   Final   Culture  Setup Time     Final   Value: 12/31/2012 17:46     Performed at [facility]   Culture     Final   Value: NO GROWTH 5 DAYS     Performed at [facility]   Report Status 01/06/2013 FINAL   Final  CULTURE, EXPECTORATED SPUTUM-ASSESSMENT     Status: None   Collection Time    01/01/13  5:52 AM      Result Value Range Status   Specimen Description SPUTUM   Final   Special Requests NONE   Final   Sputum evaluation     Final   Value: THIS SPECIMEN IS ACCEPTABLE. RESPIRATORY CULTURE REPORT TO FOLLOW.   Report Status 01/01/2013 FINAL   Final  CULTURE, RESPIRATORY (NON-EXPECTORATED)     Status: None   Collection Time    01/01/13  5:52 AM      Result Value Range Status   Specimen Description SPUTUM   Final   Special Requests NONE   Final   Gram Stain     Final   Value: ABUNDANT WBC PRESENT, PREDOMINANTLY PMN     RARE SQUAMOUS EPITHELIAL CELLS PRESENT     RARE GRAM POSITIVE COCCI     IN PAIRS RARE GRAM NEGATIVE RODS     Performed at [facility]   Culture     Final   Value: NORMAL OROPHARYNGEAL FLORA     Performed at [facility]   Report Status 01/03/2013 FINAL   Final  MRSA PCR SCREENING     Status: None   Collection Time    01/04/13  8:45 PM      Result Value Range Status   MRSA by PCR NEGATIVE  NEGATIVE Final   Comment:            The GeneXpert MRSA Assay (FDA     approved for NASAL specimens     only), is one component of a     comprehensive MRSA colonization     surveillance program. It is not     intended to diagnose MRSA  infection nor to guide or     monitor treatment for      MRSA infections.     Studies: No results found.  Scheduled Meds: . acetaminophen  1,000 mg Oral TID  . digoxin  0.125 mg Intravenous Daily  . famotidine  20 mg Oral BID  . feeding supplement (ENSURE COMPLETE)  237 mL Oral Q24H  . [START ON 01/09/2013] feeding supplement (RESOURCE BREEZE)  1 Container Oral Q24H  . insulin aspart  0-9 Units Subcutaneous Q8H  . [START ON 01/09/2013] levothyroxine  112 mcg Oral QAC breakfast  . lip balm  1 application Topical BID  . metoprolol tartrate  12.5 mg Oral BID  . multivitamin with minerals  1 tablet Oral Daily  . psyllium  1 packet Oral BID  . saccharomyces boulardii  250 mg Oral BID  . sodium chloride  10-40 mL Intracatheter Q12H   Continuous Infusions: . Marland KitchenTPN (CLINIMIX-E) Adult     And  . fat emulsion      Principal Problem:   Femoral hernia with gangrene and obstruction Active Problems:   Unspecified intestinal obstruction   Malnutrition of moderate degree   Moderate malnutrition   HCAP (healthcare-associated pneumonia)   Leukocytosis, unspecified    Time spent: 25 minutes    Visente Kirker C  [facility] Pager [phone]  If 7PM-7AM, please contact night-coverage at [email or web address], password [facility] 01/08/2013, 4:48 PM  LOS: 17 days

## 2013-01-08 NOTE — Progress Notes (Addendum)
PARENTERAL NUTRITION CONSULT NOTE   Pharmacy Consult for TNA Indication: Ileus  Allergies  Allergen Reactions  . Hydrocodone Itching  . Cephalexin Itching and Swelling  . Doxycycline Other (See Comments)    Per pt: unknown  . Ofloxacin Other (See Comments)    Per pt: unknown  . Tape Other (See Comments)    Burns skin.   . Latex Rash    Patient Measurements: Height: 5\' 8"  (172.7 cm) Weight: 139 lb 12.4 oz (63.4 kg) IBW/kg (Calculated) : 63.9  Labs:  Recent Labs  01/06/13 0430 01/07/13 0300 01/08/13 0455  WBC 16.1* 10.7* 8.3  HGB 11.2* 11.0* 10.4*  HCT 32.7* 31.5* 29.4*  PLT 241 278 343     Recent Labs  01/06/13 0430 01/08/13 0445 01/08/13 0455  NA 130*  --  135  K 4.1  --  3.6  CL 101  --  102  CO2 24  --  27  GLUCOSE 125*  --  106*  BUN 20  --  16  CREATININE 0.56  --  0.49*  CALCIUM 8.3*  --  8.2*  MG  --   --  1.9  PHOS  --   --  4.4  PROT  --   --  4.8*  ALBUMIN  --   --  1.6*  AST  --   --  82*  ALT  --   --  83*  ALKPHOS  --   --  117  BILITOT  --   --  0.2*  TRIG  --  52  --    Estimated Creatinine Clearance: 58 ml/min (by C-G formula based on Cr of 0.49).    Recent Labs  01/07/13 1600 01/07/13 2159 01/08/13 0100  GLUCAP 94 120* 117*    CBGs & Insulin requirements past 24 hours:  - CBGs 94-120, did not require any insulin  Nutritional Goals:  - RD recs 10/29: 1650-1900 Kcal, 75-90 g protein, 1.7L/day - Clinimix E 5/15 at goal rate of 75 ml/hr + 20% Lipids at 91ml/hr will provide 90g protein, 1656 kcal per day  Current nutrition:  - Diet: full liquid diet starting 11/3 - TNA: Clinimix E 5/15 at goal 75 ml/hr, IVFE 20% at 8 ml/hr - mIVF: none, central line maintenance NS entered - Resource Breeze 1 container TID   Assessment:  79 yo female s/p small bowel resection and tissue repair of right femoral hernia on 10/20. Diet advanced to clears on 10/24 but had significant abdominal pain, nausea and vomiting, 10/26 abd film revealed  dilated small bowel loops suspicious for SBO. Patient NPO, PICC placed and TNA started 10/26. S/p ex lap on 10/30 with femoral hernia repair with small bowel resection for ischemic bowel with gangrene. NG tube out 11/2 - 11/3: TNA D#9. Surgery ordered to "wean off TNA as pt's diet advances". Clarified with MD, wean over the next couple of days. Hopefully wean to off tomorrow morning then discharge on Wednesday. Diet advanced to full liquid diet this morning and Resource Breeze ordered (pt had 1 so far).   Labs: Electrolytes:  K 3.6, Na 135, Mag/phos wnl Renal Function: Scr trending down, UOP 0.1 ml/kg/hr yesterday but so far today at 1.4 ml/kg/hr (f/u) Hepatic Function: AST/ALT trending up to 82/83.  Pre-Albumin: low - 8.6 (10/23), 5.1 (10/27), 11/3 lab pending Triglycerides: wnl CBGs: controlled on SSI   Plan: at 1800 tonight  Reduce Clinimix E 5/15 to 40 ml/hr.   Reduce IVF emulsion 20% to 5 mlhr  Standard multivitamins and trace elements via po multivitamin tablet  Continue CBGs/SSI q8h  Kdur 20 mgEq x 1   TNA labs Monday/Thursdays  Pharmacy will follow up with possible plan to wean TNA to off tomorrow.    Geoffry Paradise, PharmD, BCPS Pager: [phone] 10:25 AM Pharmacy #: [phone]

## 2013-01-08 NOTE — Progress Notes (Signed)
[name] [id number] [birth year]  CARE TEAM:  PCP: [name] Relic, MD  Outpatient Care Team: Patient Care Team: [name] Relic, MD as PCP - General (Internal Medicine)  Inpatient Treatment Team: Treatment Team: Attending Provider: Bishop Limbo, MD; Registered Nurse: Louie Bun, RN; Consulting Physician: Bishop Limbo, MD; Registered Nurse: Quentin Cornwall, RN; Consulting Physician: Hillis Range, MD; Rounding Team: Massie Kluver, MD; Technician: Karren Burly, [place]; Registered Nurse: Kathryne Sharper, RN; Registered Nurse: Roxy Manns, RN; Registered Nurse: Nino Parsley, RN; Technician: Ray Church, [place]; Registered Nurse: Elaina Pattee, RN; Technician: Valetta Close, [place]; Registered Nurse: Edythe Clarity, RN; Physical Therapist: Caswell Corwin, PT; Occupational Therapist: Marica Otter, OT; Technician: Satira Mccallum, NT   Subjective:  Wants to go home Not using PCA - hates the alarms but takes off the CO2 monitor Feels stronger  Objective:  Vital signs:  Filed Vitals:   01/08/13 0056 01/08/13 0400 01/08/13 0532 01/08/13 0800  BP: 167/64  156/55   Pulse: 63  68   Temp:   99.2 F (37.3 C)   TempSrc:   Oral   Resp:  20 23 16   Height:      Weight:      SpO2:  94% 93% 99%    Last BM Date: 01/07/13  Intake/Output   Yesterday:  11/02 0701 - 11/03 0700 In: 3120 [P.O.:1200; I.V.:274; IV Piggyback:650; TPN:996] Out: 265 [Urine:225; Emesis/NG output:30; Drains:10] This shift:  Total I/O In: -  Out: 300 [Urine:300]  Bowel function:  Flatus: y  BM: y  Drain: Minimal serous  Physical Exam:  General: Pt awake/alert/oriented x4 in no acute distress, sitting up in bed Eyes: PERRL, normal EOM.  Sclera clear.  No icterus Neuro: CN II-XII intact w/o focal sensory/motor deficits. Lymph: No head/neck/groin lymphadenopathy Psych:  No delerium/psychosis/paranoia HENT: Normocephalic, Mucus membranes moist.  No thrush Neck: Supple,  No tracheal deviation Chest: No chest wall pain w good excursion CV:  Pulses intact.  Regular rhythm MS: Normal AROM mjr joints.  No obvious deformity Abdomen: Soft.  Nondistended.  Mildly tender at incisions only.  No evidence of peritonitis.  No incarcerated hernias.   Ext:  SCDs BLE.  No mjr edema.  No cyanosis Skin: No petechiae / purpura   Problem List:   Principal Problem:   Femoral hernia with gangrene and obstruction Active Problems:   Unspecified intestinal obstruction   Malnutrition of moderate degree   Moderate malnutrition   HCAP (healthcare-associated pneumonia)   Leukocytosis, unspecified   Assessment  [name]  79 y.o. female  4 Days Post-Op  Procedure(s): Diagnostic Laparoscopy, exploratory laparotomy with small bowel resection, closure of right femoral hernia repair  Recurrent femoral hernia incarceration status post re\re repaired & SB resection, resolving from ileus  Plan:  -DC PCA. Switched intermittent IV meds.  Advance diet.  Wean off TNA and she advances on her diet  -VTE prophylaxis- SCDs, etc  -mobilize as tolerated to help recovery.  Consider reevaluation by PT/OT for SNF. She does not want to go to a skilled facility anyway. May be in better shape for home health.   [name], M.D., F.A.C.S. Gastrointestinal and Minimally Invasive Surgery [facility] [address] [place], [place] [address] [phone] Main / Paging   01/08/2013   Results:   Labs: Results for orders placed during the hospital encounter of 12/22/12 (from the past 48 hour(s))  GLUCOSE, CAPILLARY  Status: None   Collection Time    01/06/13 12:43 PM      Result Value Range   Glucose-Capillary 94  70 - 99 mg/dL   Comment 1 Documented in Chart     Comment 2 Notify RN    GLUCOSE, CAPILLARY     Status: Abnormal   Collection Time    01/06/13  3:33 PM      Result Value Range   Glucose-Capillary 100 (*) 70 - 99 mg/dL   GLUCOSE, CAPILLARY     Status: None   Collection Time    01/06/13 11:33 PM      Result Value Range   Glucose-Capillary 97  70 - 99 mg/dL  CBC     Status: Abnormal   Collection Time    01/07/13  3:00 AM      Result Value Range   WBC 10.7 (*) 4.0 - 10.5 K/uL   RBC 3.58 (*) 3.87 - 5.11 MIL/uL   Hemoglobin 11.0 (*) 12.0 - 15.0 g/dL   HCT 16.1 (*) 09.6 - 04.5 %   MCV 88.0  78.0 - 100.0 fL   MCH 30.7  26.0 - 34.0 pg   MCHC 34.9  30.0 - 36.0 g/dL   RDW 40.9  81.1 - 91.4 %   Platelets 278  150 - 400 K/uL  GLUCOSE, CAPILLARY     Status: Abnormal   Collection Time    01/07/13  7:47 AM      Result Value Range   Glucose-Capillary 112 (*) 70 - 99 mg/dL  GLUCOSE, CAPILLARY     Status: None   Collection Time    01/07/13  4:00 PM      Result Value Range   Glucose-Capillary 94  70 - 99 mg/dL  GLUCOSE, CAPILLARY     Status: Abnormal   Collection Time    01/07/13  9:59 PM      Result Value Range   Glucose-Capillary 120 (*) 70 - 99 mg/dL   Comment 1 Documented in Chart     Comment 2 Notify RN    GLUCOSE, CAPILLARY     Status: Abnormal   Collection Time    01/08/13  1:00 AM      Result Value Range   Glucose-Capillary 117 (*) 70 - 99 mg/dL  TRIGLYCERIDES     Status: None   Collection Time    01/08/13  4:45 AM      Result Value Range   Triglycerides 52  <150 mg/dL   Comment: Performed at [facility]  COMPREHENSIVE METABOLIC PANEL     Status: Abnormal   Collection Time    01/08/13  4:55 AM      Result Value Range   Sodium 135  135 - 145 mEq/L   Potassium 3.6  3.5 - 5.1 mEq/L   Chloride 102  96 - 112 mEq/L   CO2 27  19 - 32 mEq/L   Glucose, Bld 106 (*) 70 - 99 mg/dL   BUN 16  6 - 23 mg/dL   Creatinine, Ser 7.82 (*) 0.50 - 1.10 mg/dL   Calcium 8.2 (*) 8.4 - 10.5 mg/dL   Total Protein 4.8 (*) 6.0 - 8.3 g/dL   Albumin 1.6 (*) 3.5 - 5.2 g/dL   AST 82 (*) 0 - 37 U/L   ALT 83 (*) 0 - 35 U/L   Alkaline Phosphatase 117  39 - 117 U/L   Total Bilirubin 0.2 (*) 0.3 - 1.2 mg/dL    GFR calc  non Af Amer >90  >90 mL/min   GFR calc Af Amer >90  >90 mL/min   Comment: (NOTE)     The eGFR has been calculated using the CKD EPI equation.     This calculation has not been validated in all clinical situations.     eGFR's persistently <90 mL/min signify possible Chronic Kidney     Disease.  MAGNESIUM     Status: None   Collection Time    01/08/13  4:55 AM      Result Value Range   Magnesium 1.9  1.5 - 2.5 mg/dL  PHOSPHORUS     Status: None   Collection Time    01/08/13  4:55 AM      Result Value Range   Phosphorus 4.4  2.3 - 4.6 mg/dL  CBC     Status: Abnormal   Collection Time    01/08/13  4:55 AM      Result Value Range   WBC 8.3  4.0 - 10.5 K/uL   RBC 3.33 (*) 3.87 - 5.11 MIL/uL   Hemoglobin 10.4 (*) 12.0 - 15.0 g/dL   HCT 16.1 (*) 09.6 - 04.5 %   MCV 88.3  78.0 - 100.0 fL   MCH 31.2  26.0 - 34.0 pg   MCHC 35.4  30.0 - 36.0 g/dL   RDW 40.9  81.1 - 91.4 %   Platelets 343  150 - 400 K/uL  DIFFERENTIAL     Status: None   Collection Time    01/08/13  4:55 AM      Result Value Range   Neutrophils Relative % 76  43 - 77 %   Neutro Abs 6.3  1.7 - 7.7 K/uL   Lymphocytes Relative 12  12 - 46 %   Lymphs Abs 1.0  0.7 - 4.0 K/uL   Monocytes Relative 9  3 - 12 %   Monocytes Absolute 0.8  0.1 - 1.0 K/uL   Eosinophils Relative 2  0 - 5 %   Eosinophils Absolute 0.2  0.0 - 0.7 K/uL   Basophils Relative 0  0 - 1 %   Basophils Absolute 0.0  0.0 - 0.1 K/uL    Imaging / Studies: No results found.  Medications / Allergies: per chart  Antibiotics: Anti-infectives   Start     Dose/Rate Route Frequency Ordered Stop   01/07/13 1000  fluconazole (DIFLUCAN) tablet 150 mg     150 mg Oral  Once 01/07/13 0904 01/07/13 1014   01/02/13 2000  vancomycin (VANCOCIN) IVPB 1000 mg/200 mL premix     1,000 mg 200 mL/hr over 60 Minutes Intravenous Every 12 hours 01/02/13 1254 01/07/13 2203   01/01/13 0000  vancomycin (VANCOCIN) 500 mg in sodium chloride 0.9 % 100 mL IVPB  Status:   Discontinued     500 mg 100 mL/hr over 60 Minutes Intravenous Every 12 hours 12/31/12 1133 01/02/13 1254   12/31/12 1200  aztreonam (AZACTAM) 1 g in dextrose 5 % 50 mL IVPB     1 g 100 mL/hr over 30 Minutes Intravenous Every 8 hours 12/31/12 1120 01/08/13 0559   12/31/12 1200  vancomycin (VANCOCIN) IVPB 1000 mg/200 mL premix     1,000 mg 200 mL/hr over 60 Minutes Intravenous  Once 12/31/12 1133 12/31/12 1413   12/26/12 0800  ertapenem (INVANZ) 1 g in sodium chloride 0.9 % 50 mL IVPB     1 g 100 mL/hr over 30 Minutes Intravenous Every 24 hours 12/25/12 1529 12/26/12 0820  12/25/12 1100  [MAR Hold]  ertapenem (INVANZ) 1 g in sodium chloride 0.9 % 50 mL IVPB     (On MAR Hold since 12/25/12 1112)   1 g 100 mL/hr over 30 Minutes Intravenous On call to O.R. 12/25/12 1045 12/25/12 1145   12/25/12 0800  cefOXitin (MEFOXIN) 2 g in dextrose 5 % 50 mL IVPB  Status:  Discontinued     2 g 100 mL/hr over 30 Minutes Intravenous On call to O.R. 12/25/12 1610 12/25/12 1544

## 2013-01-09 MED ORDER — DIGOXIN 125 MCG PO TABS
0.1250 mg | ORAL_TABLET | Freq: Every day | ORAL | Status: DC
Start: 2013-01-10 — End: 2013-01-10
  Administered 2013-01-10: 0.125 mg via ORAL
  Filled 2013-01-09: qty 1

## 2013-01-09 MED ORDER — TRANDOLAPRIL 2 MG PO TABS: 2.0000 mg | ORAL_TABLET | Freq: Two times a day (BID) | ORAL | Status: DC

## 2013-01-09 MED ORDER — DSS 100 MG PO CAPS: 100.0000 mg | ORAL_CAPSULE | Freq: Every day | ORAL | Status: DC | PRN

## 2013-01-09 MED ORDER — POLYETHYLENE GLYCOL 3350 17 G PO PACK
17.0000 g | PACK | Freq: Every day | ORAL | Status: DC | PRN
  Filled 2013-01-09: qty 1

## 2013-01-09 MED ORDER — CLINIMIX E/DEXTROSE (5/15) 5 % IV SOLN
INTRAVENOUS | Status: AC
  Filled 2013-01-09: qty 1000

## 2013-01-09 MED ORDER — FAT EMULSION 20 % IV EMUL
120.0000 mL | INTRAVENOUS | Status: AC
  Filled 2013-01-09: qty 200

## 2013-01-09 MED ORDER — POLYETHYLENE GLYCOL 3350 17 GM/SCOOP PO POWD: 1.0000 | Freq: Once | ORAL | Status: DC

## 2013-01-09 MED ORDER — DOCUSATE SODIUM 100 MG PO CAPS
100.0000 mg | ORAL_CAPSULE | Freq: Every day | ORAL | Status: DC | PRN
  Filled 2013-01-09: qty 1

## 2013-01-09 MED ORDER — OXYCODONE HCL 5 MG PO TABS: 5.0000 mg | ORAL_TABLET | ORAL | Status: DC | PRN

## 2013-01-09 NOTE — Progress Notes (Signed)
D/c to snf when ready

## 2013-01-09 NOTE — Progress Notes (Signed)
PARENTERAL NUTRITION CONSULT NOTE   Pharmacy Consult for TNA Indication: Ileus  Allergies  Allergen Reactions  . Hydrocodone Itching  . Cephalexin Itching and Swelling  . Doxycycline Other (See Comments)    Per pt: unknown  . Ofloxacin Other (See Comments)    Per pt: unknown  . Tape Other (See Comments)    Burns skin.   . Latex Rash    Patient Measurements: Height: 5\' 8"  (172.7 cm) Weight: 139 lb 12.4 oz (63.4 kg) IBW/kg (Calculated) : 63.9  Labs:  Recent Labs  01/07/13 0300 01/08/13 0455  WBC 10.7* 8.3  HGB 11.0* 10.4*  HCT 31.5* 29.4*  PLT 278 343     Recent Labs  01/08/13 0445 01/08/13 0455  NA  --  135  K  --  3.6  CL  --  102  CO2  --  27  GLUCOSE  --  106*  BUN  --  16  CREATININE  --  0.49*  CALCIUM  --  8.2*  MG  --  1.9  PHOS  --  4.4  PROT  --  4.8*  ALBUMIN  --  1.6*  AST  --  82*  ALT  --  83*  ALKPHOS  --  117  BILITOT  --  0.2*  PREALBUMIN  --  9.9*  TRIG 52  --    Estimated Creatinine Clearance: 58 ml/min (by C-G formula based on Cr of 0.49).    Recent Labs  01/08/13 1630 01/08/13 2308 01/09/13 0744  GLUCAP 118* 89 91    CBGs & Insulin requirements past 24 hours:  - CBGs 89-118, did not require any insulin  Nutritional Goals:  - RD recs 10/29: 1650-1900 Kcal, 75-90 g protein, 1.7L/day - Clinimix E 5/15 at goal rate of 75 ml/hr + 20% Lipids at 85ml/hr will provide 90g protein, 1656 kcal per day  Current nutrition:  - Diet: Cardiac diet starting 11/4 - TNA: Clinimix E 5/15 at goal 40 ml/hr, IVFE 20% at 5 ml/hr - mIVF: none, central line maintenance NS entered - Resource Breeze 1 container TID   Assessment:  79 yo female s/p small bowel resection and tissue repair of right femoral hernia on 10/20. Diet advanced to clears on 10/24 but had significant abdominal pain, nausea and vomiting, 10/26 abd film revealed dilated small bowel loops suspicious for SBO. Patient NPO, PICC placed and TNA started 10/26. S/p ex lap on 10/30  with femoral hernia repair with small bowel resection for ischemic bowel with gangrene. NG tube out 11/2 - 11/4: TNA D#10. Surgery ordered to "wean off TNA as pt's diet advances". Diet advanced to Cardiac diet today. Per Surgery notes, wean TNA to off today with possible discharge today once PT/OT sees patient. Pt refusing SNF - will go home with home health.    Labs: No new labs 11/4 Electrolytes:  K 3.6, Na 135, Mag/phos wnl Renal Function: Scr trending down, UOP 0.1 ml/kg/hr yesterday but so far today at 1.4 ml/kg/hr (f/u) Hepatic Function: AST/ALT trending up to 82/83.  Pre-Albumin: low - 8.6 (10/23), 5.1 (10/27), 11/3 lab pending Triglycerides: wnl CBGs: controlled on SSI   Plan:   Continue current bag of Clinimix E 5/15 to 40 ml/hr and IVF emulsion 20% at 5 mlhr and can turn to OFF when pt is discharged today or at 1800 as per order.   Standard multivitamins and trace elements via po multivitamin tablet  Stop CBGs/SSI q8h at 1800 today  Will d/c all TNA labs  Geoffry Paradise, PharmD, BCPS Pager: [phone] 10:17 AM Pharmacy #: [phone]

## 2013-01-09 NOTE — Progress Notes (Signed)
    Subjective:  Feels weak. No CP, dyspnea, or palps  Objective:  Vital Signs in the last 24 hours: Temp:  [97.6 F (36.4 C)-98.4 F (36.9 C)] 98.3 F (36.8 C) (11/04 0512) Pulse Rate:  [53-60] 60 (11/04 1030) Resp:  [16-18] 16 (11/04 1030) BP: (138-149)/(49-81) 144/63 mmHg (11/04 1030) SpO2:  [94 %-99 %] 97 % (11/04 1030)  Intake/Output from previous day: 11/03 0701 - 11/04 0700 In: 2518.4 [P.O.:1080; TPN:1438.4] Out: 2250 [Urine:2250]  Physical Exam: Pt is alert and oriented, thin woman in NAD HEENT: normal Neck: JVP - normal, carotids 2+= without bruits Lungs: CTA bilaterally CV: RRR with soft systolic murmur at LSB Abd: soft, +BS Ext: no C/C/E, distal pulses intact and equal Skin: warm/dry no rash   Lab Results:  Recent Labs  01/07/13 0300 01/08/13 0455  WBC 10.7* 8.3  HGB 11.0* 10.4*  PLT 278 343    Recent Labs  01/08/13 0455  NA 135  K 3.6  CL 102  CO2 27  GLUCOSE 106*  BUN 16  CREATININE 0.49*   No results found for this basename: TROPONINI, CK, MB,  in the last 72 hours  Tele: Paced rhythm with PVC's   Assessment/Plan:  1. Nonischemic cardiomyopathy  2. Biventricular paced rhythm  3. Stable postoperative state  4. Improvement in nonsustained ventricular tachycardia  Stable from cardiac perspective. Now that she is taking PO will change digoxin from IV to PO. No other changes. Will arrange hospital follow-up with Dr Swaziland.   Tonny Bollman, M.D. 01/09/2013, 10:42 AM

## 2013-01-09 NOTE — Discharge Summary (Signed)
Physician Discharge Summary  Patient ID: KORISSA HORSFORD MRN: [id number] DOB/AGE: 1932-07-24 79 y.o.  Admit date: 12/22/2012 Discharge date: 01/10/2013  Admitting Diagnosis: Nausea/vomiting Generalized abdominal pain pSBO Dilated cardiomyopathy EF 45-50% LBBB HTN CHF Thyroid disease Melanoma Anxiety/depression  Discharge Diagnosis Patient Active Problem List   Diagnosis Date Noted  . HCAP (healthcare-associated pneumonia) 12/31/2012  . Leukocytosis, unspecified 12/31/2012  . Femoral hernia with gangrene and obstruction 12/25/2012  . Malnutrition of moderate degree 12/24/2012  . Moderate malnutrition 12/24/2012  . Unspecified intestinal obstruction 12/23/2012  . Nausea with vomiting 12/22/2012  . Abdominal  pain, other specified site 12/22/2012  . Hypothyroidism   . Hair thinning   . Anxiety disorder   . Other primary cardiomyopathies   . Unspecified sinusitis (chronic)   . Unspecified chronic bronchitis   . Depressive disorder, not elsewhere classified   . Restless legs syndrome (RLS)   . Chronic airway obstruction, not elsewhere classified   . PVC (premature ventricular contraction) 06/08/2011  . ANXIETY 02/03/2009  . GERD 02/03/2009  . CHRONIC SYSTOLIC HEART FAILURE 12/12/2008  . ICD-[employer] 12/10/2008  . BARRETT'S ESOPHAGUS 05/29/2008  . GASTRITIS 05/29/2008  . OTHER SPECIFIED DISORDER OF STOMACH AND DUODENUM 04/29/2008  . SLEEP APNEA 04/08/2008  . ABDOMINAL PAIN, EPIGASTRIC 04/08/2008  . NONSPECIFIC ABN FINDING RAD & OTH EXAM GI TRACT 04/08/2008    Consultants Dr. Johney Frame (Cardiology) Dr. Ardyth Harps (IM) Dr. Juanda Chance (GI)  Imaging: No results found.  Procedures Dr. Derrell Lolling (12/25/12) - Exploration right groin, small bowel resection, partial omentectomy, primary tissue repair of the right femoral hernia, Cooper's ligament technique  Dr. Biagio Quint (01/04/13) - Diagnostic laparoscopy with conversion to exploratory laparotomy, small-bowel resection,  and femoral hernia repair.   Hospital Course:  79 yo WF known to our practice after [facility] repair with mesh by Dr. Avel Peace on 09/26/2012. Post op course complicated by "mass" in right groin - possibly lipoma versus femoral hernia containing fatty tissue. CTA shows fat in region, no bowel involvement.  Patient admitted on 12/22/12 with SBO versus ileus. Patient became ill on Wednesday (12/20/2012) after eating at [employer]. Onset of nausea and emesis.  Her past medical history is significant for nonischemic cardiomyopathy with an ejection fraction around 45-50%. She has a pacer in place. She has been evaluated by cardiology this admission for wide complex arrhythmias that they believe is due to her pacing.  Previous hx of hysterectomy and appendectomy and hernia repair as above.    Admitted on GI service. KUB with dilated loops of small bowel consistent with ileus versus SBO. CT scan obtained which showed incarcerated femoral hernia with SBO.  She was taken to the OR on 12/25/12 by Dr. Derrell Lolling where she was found to have a strangulated right femoral hernia with small bowel obstruction.  Small bowel was re-sected as well as hernia repair.  She has had a post op ileus.  She also had leukocytosis and a CXR consistent with Pneumonia and thus was started on antibiotics and finished an 8 day course.  On 01/04/13 she was taken back to the OR secondary to continued ileus and she was found to have a femoral hernia containing incarcerated small bowel anastomosis with a definite transition point.  Ultimately, she had a diagnostic lap converted to ex lap with SBR, and femoral hernia repair.    She was maintained on TPN.  On 01/06/13 her foley discontinued and started clamping trials.  Bowel function started to return and TPN was weaned as diet was advanced.  She was managed by cardiology during her hospital stay for nonischemic cardiomyopathy, biventricular paced rhythm, nonsustained ventricular tachycardia.  Her  hospital acquired pneumonia, hypotension/hypertension was managed by Internal medicine team.  Her PCA was discontinued and started on oral pain meds.  PT/OT worked with the patient and due to significant fatigue/deconditioning with ADL's recommended SNF upon discharge.  On HD #19, the patient was voiding well, tolerating diet, ambulating well, pain well controlled, vital signs stable, incisions c/d/i with staples in place and felt stable for discharge to a SNF.  Every other staple in the midline wound were removed and the rest left in place and the right groin staples were removed prior to discharge.  She did have some staple irritation, but no signs of infection.  Patient will follow up next week for staple removal and in 2-3 weeks with Dr. Derrell Lolling.  She knows to call with questions or concerns.  She is encouraged to f/u with her cardiologist and her PCP as well.  Physical Exam: General:  Alert, NAD, pleasant, comfortable Abd:  Soft, NT/ND, +BS, no HSM, midline and LLQ incisions C/D/I with staples in place with minimal local erythema without signs of infection, 1cm area of induration near the middle of the midline incision without drainage or significant pain     Medication List         acetaminophen 500 MG tablet  Commonly known as:  TYLENOL  Take 500 mg by mouth every 6 (six) hours as needed for pain. For pain     Biotin 1000 MCG tablet  Take 5,000 mcg by mouth daily.     CALCIUM + D PO  Take 1 tablet by mouth daily. 1200mg  of Calcium and 1000mg  Vit D     digoxin 0.125 MG tablet  Commonly known as:  LANOXIN  Take 1 tablet (125 mcg total) by mouth daily.     DSS 100 MG Caps  Take 100 mg by mouth daily as needed for mild constipation.     ESTRACE VAGINAL 0.1 MG/GM vaginal cream  Generic drug:  estradiol  Place 1 g vaginally 2 (two) times a week.     estrogen-methylTESTOSTERone 0.625-1.25 MG per tablet  Take 1 tablet by mouth daily.     furosemide 20 MG tablet  Commonly known as:   LASIX  Take 20 mg by mouth daily as needed. For fluid retention     levothyroxine 112 MCG tablet  Commonly known as:  SYNTHROID, LEVOTHROID  Take 112 mcg by mouth daily.     Magnesium 250 MG Tabs  Take 1 tablet by mouth daily.     metoprolol tartrate 25 MG tablet  Commonly known as:  LOPRESSOR  Take 12.5 mg by mouth daily.     multivitamin with minerals Tabs tablet  Take 1 tablet by mouth daily.     OVER THE COUNTER MEDICATION  - Place 1 drop into both eyes daily as needed. OTC Dry Eyes Eyedrop  -   - For dry eyes     oxyCODONE 5 MG immediate release tablet  Commonly known as:  Oxy IR/ROXICODONE  Take 1-2 tablets (5-10 mg total) by mouth every 4 (four) hours as needed for moderate pain, severe pain or breakthrough pain.     polyethylene glycol powder powder  Commonly known as:  GLYCOLAX/MIRALAX  Take 255 g (1 Container total) by mouth once.     potassium chloride SA 20 MEQ tablet  Commonly known as:  K-DUR,KLOR-CON  Take 20 mEq by mouth  daily as needed. When taking the lasix     RABEprazole 20 MG tablet  Commonly known as:  ACIPHEX  Take 1 tablet (20 mg total) by mouth daily.     trandolapril 2 MG tablet  Commonly known as:  MAVIK  Take 1 tablet (2 mg total) by mouth 2 (two) times daily. Resume this medication     VITAMIN B 12 PO  Take 1 tablet by mouth daily.     vitamin C 1000 MG tablet  Take 1,000 mg by mouth daily.         Follow-up Information   Follow up with Ernestene Mention, MD. Schedule an appointment as soon as possible for a visit in 2 weeks.   Specialty:  General Surgery   Contact information:   [address] [place] [address] [phone]       Follow up with CCS,MD, MD. Schedule an appointment as soon as possible for a visit on 01/17/2013. (Schedule your staple removal for 10-14 days after your surgery)    Specialty:  General Surgery   Contact information:   [address] [place]  [address] [phone]       Follow up with GREEN, Lenon Curt, MD. Schedule an appointment as soon as possible for a visit in 2 weeks. (make an appointment with your primary care provider)    Specialty:  Internal Medicine   Contact information:   [address] Jeanella Anton [place] [address] [phone]       Follow up with Peter Swaziland, MD. Schedule an appointment as soon as possible for a visit in 2 weeks.   Specialty:  Cardiology   Contact information:   [address] [place] [address] [phone]       Signed: Aris [name], [facility] [phone]  01/09/2013, 11:41 AM

## 2013-01-09 NOTE — Progress Notes (Signed)
Patient improving. Weaning off T&A.  Initially refused to go to skilled nursing facility but too unstable to go home by herself. She relents and his agrees to skilled nursing facility.  We will work to transition to close surgical outpatient follow up

## 2013-01-09 NOTE — Progress Notes (Signed)
Provided SNF bed offers to patient and her friend, Richard Harris,at bedside- they have selected SNF bed at [facility]- private room which patient is pleased with- Will await MD for further d/c planning-   Reece Levy, MSW, [employer] [phone]

## 2013-01-09 NOTE — Progress Notes (Signed)
5 Days Post-Op  Subjective: Pt doing much better.  No abdominal pain, no N/V.  Tolerating HH diet.  Ambulating well through the halls.  Wants to go home, refusing SNF.  Ambulating with PT/OT, but refused OT yesterday due to fatigue.    Objective: Vital signs in last 24 hours: Temp:  [97.6 F (36.4 C)-98.4 F (36.9 C)] 98.3 F (36.8 C) (11/04 0512) Pulse Rate:  [53-67] 58 (11/04 0512) Resp:  [16-18] 16 (11/04 0512) BP: (120-149)/(49-81) 148/49 mmHg (11/04 0512) SpO2:  [94 %-99 %] 94 % (11/04 0512) Last BM Date: 01/08/13  Intake/Output from previous day: 11/03 0701 - 11/04 0700 In: 2518.4 [P.O.:1080; ZOX:0960.4] Out: 2250 [Urine:2250] Intake/Output this shift:    PE: Gen:  Alert, NAD, pleasant Abd: Soft, NT/ND, +BS, no HSM, midline and LLQ incisions C/D/I with staples in place with minimal local erythema without signs of infection   Lab Results:   Recent Labs  01/07/13 0300 01/08/13 0455  WBC 10.7* 8.3  HGB 11.0* 10.4*  HCT 31.5* 29.4*  PLT 278 343   BMET  Recent Labs  01/08/13 0455  NA 135  K 3.6  CL 102  CO2 27  GLUCOSE 106*  BUN 16  CREATININE 0.49*  CALCIUM 8.2*   PT/INR No results found for this basename: LABPROT, INR,  in the last 72 hours CMP     Component Value Date/Time   NA 135 01/08/2013 0455   NA 136 12/07/2012 1422   K 3.6 01/08/2013 0455   CL 102 01/08/2013 0455   CO2 27 01/08/2013 0455   GLUCOSE 106* 01/08/2013 0455   GLUCOSE 90 12/07/2012 1422   BUN 16 01/08/2013 0455   BUN 17 12/07/2012 1422   CREATININE 0.49* 01/08/2013 0455   CALCIUM 8.2* 01/08/2013 0455   PROT 4.8* 01/08/2013 0455   PROT 5.8* 12/07/2012 1422   ALBUMIN 1.6* 01/08/2013 0455   AST 82* 01/08/2013 0455   ALT 83* 01/08/2013 0455   ALKPHOS 117 01/08/2013 0455   BILITOT 0.2* 01/08/2013 0455   GFRNONAA >90 01/08/2013 0455   GFRAA >90 01/08/2013 0455   Lipase     Component Value Date/Time   LIPASE 15 12/20/2012 2320       Studies/Results: No results  found.  Anti-infectives: Anti-infectives   Start     Dose/Rate Route Frequency Ordered Stop   01/07/13 1000  fluconazole (DIFLUCAN) tablet 150 mg     150 mg Oral  Once 01/07/13 0904 01/07/13 1014   01/02/13 2000  vancomycin (VANCOCIN) IVPB 1000 mg/200 mL premix     1,000 mg 200 mL/hr over 60 Minutes Intravenous Every 12 hours 01/02/13 1254 01/07/13 2203   01/01/13 0000  vancomycin (VANCOCIN) 500 mg in sodium chloride 0.9 % 100 mL IVPB  Status:  Discontinued     500 mg 100 mL/hr over 60 Minutes Intravenous Every 12 hours 12/31/12 1133 01/02/13 1254   12/31/12 1200  aztreonam (AZACTAM) 1 g in dextrose 5 % 50 mL IVPB     1 g 100 mL/hr over 30 Minutes Intravenous Every 8 hours 12/31/12 1120 01/08/13 0559   12/31/12 1200  vancomycin (VANCOCIN) IVPB 1000 mg/200 mL premix     1,000 mg 200 mL/hr over 60 Minutes Intravenous  Once 12/31/12 1133 12/31/12 1413   12/26/12 0800  ertapenem (INVANZ) 1 g in sodium chloride 0.9 % 50 mL IVPB     1 g 100 mL/hr over 30 Minutes Intravenous Every 24 hours 12/25/12 1529 12/26/12 0820   12/25/12 1100  [  MAR Hold]  ertapenem (INVANZ) 1 g in sodium chloride 0.9 % 50 mL IVPB     (On MAR Hold since 12/25/12 1112)   1 g 100 mL/hr over 30 Minutes Intravenous On call to O.R. 12/25/12 1045 12/25/12 1145   12/25/12 0800  cefOXitin (MEFOXIN) 2 g in dextrose 5 % 50 mL IVPB  Status:  Discontinued     2 g 100 mL/hr over 30 Minutes Intravenous On call to O.R. 12/25/12 0750 12/25/12 1544       Assessment/Plan Recurrent femoral hernia incarceration status post re\re repaired & SB resection, resolving from ileus 1.  Tolerating HH diet 2.  Improving with PT/OT, now recommending HH with 24 hour assist 3.  SCD's 4.  Hopefully d/c this afternoon or tomorrow once PT/OT work with her and clear her to go home with [facility], continues to refuse SNF, has a friend she can stay with with 24 hour assistance.     HAP - completed 8 days of abx, no further tx needed Hypotension -  resolved Leukocytosis - resolved PCM on TPN - weaning off Nonischemic cardiomyopathy Biventricular paced rhythm Ventricular tachycardia - improved on digoxin and metroprolol    LOS: 18 days    DORT, Elizabethann Lackey 01/09/2013, 8:27 AM Pager: [phone]

## 2013-01-09 NOTE — Progress Notes (Signed)
I spoke with her this evening.  She seems to be slowly improving and is scheduled to go to a SNF soon.

## 2013-01-09 NOTE — Progress Notes (Signed)
Patient was transferred to [facility]. Patient is getting weaned off TNA and is currently refusing snf placement. She will be going home upon discharge. No CSW needs noted. Signing off. Please reconsult if needed.  Brigitt Mcclish C. Damesha Lawler MSW, LCSW [phone]

## 2013-01-09 NOTE — Progress Notes (Signed)
Occupational Therapy Treatment Patient Details Name: KANESHIA CATER MRN: [id number] DOB: 1932/11/18 Today's Date: 01/09/2013 Time: 7253-6644 OT Time Calculation (min): 31 min  OT Assessment / Plan / Recommendation  History of present illness 79 yo female admitted 12/22/12. S/P explaoratory lap, SB resection, primary repair strangulated femoral hernia on 12/25/12. 01/04/13 underwent additonal exploratory laproscopic abdominal surgery.   OT comments  Pt fatiques easily during adl, but very motivated.  Follow Up Recommendations  SNF;Supervision/Assistance - 24 hour;Home health OT (would benefit from snf but she prefers friend's house as she feels she has been institutionalized too long)    Barriers to Discharge       Equipment Recommendations  None recommended by OT (pt has a 3:1)    Recommendations for Other Services    Frequency Min 2X/week   Progress towards OT Goals Progress towards OT goals: Progressing toward goals  Plan      Precautions / Restrictions Precautions Precautions: Fall Restrictions Weight Bearing Restrictions: No   Pertinent Vitals/Pain Sore L side; repositioned and applied ice    ADL  Upper Body Bathing: Set up Where Assessed - Upper Body Bathing: Unsupported sitting Lower Body Bathing: Maximal assistance Where Assessed - Lower Body Bathing: Supported sit to stand Toilet Transfer: Minimal assistance Toilet Transfer Method: Sit to Barista: Comfort height toilet;Grab bars Transfers/Ambulation Related to ADLs: pt ambulated to bathroom with min A. ADL Comments: Pt fatiqued while performing adl:  able to perform upper body with set up but "ran out of steam" for LB.  Provided her with energy conservation handouts and reviewed with her after ADL    OT Diagnosis:    OT Problem List:   OT Treatment Interventions:     OT Goals(current goals can now be found in the care plan section)    Visit Information  Last OT Received On:  01/09/13 Assistance Needed: +1 History of Present Illness: 79 yo female admitted 12/22/12. S/P explaoratory lap, SB resection, primary repair strangulated femoral hernia on 12/25/12. 01/04/13 underwent additonal exploratory laproscopic abdominal surgery.    Subjective Data      Prior Functioning       Cognition  Cognition Arousal/Alertness: Awake/alert Behavior During Therapy: WFL for tasks assessed/performed Overall Cognitive Status: Within Functional Limits for tasks assessed    Mobility  Transfers Sit to Stand: 4: Min guard;From chair/3-in-1;With upper extremity assist;4: Min assist;From toilet (min A from toilet) Details for Transfer Assistance: verbal cues for hand placement and safety    Exercises      Balance Static Standing Balance Static Standing - Balance Support: No upper extremity supported Static Standing - Level of Assistance: 5: Stand by assistance   End of Session OT - End of Session Activity Tolerance: Patient limited by fatigue Patient left: in chair;with call bell/phone within reach;with chair alarm set  GO     Maayan Jenning 01/09/2013, 10:18 AM Marica Otter, OTR/L [phone] 01/09/2013

## 2013-01-09 NOTE — Progress Notes (Signed)
Going to [employer].  I will let her finish current bag of TNA, plan to take PICC out in AM, and then go to SNF. Check orthostatic BP's in AM.

## 2013-01-10 ENCOUNTER — Other Ambulatory Visit: Payer: Self-pay | Admitting: *Deleted

## 2013-01-10 DIAGNOSIS — D72829 Elevated white blood cell count, unspecified: Secondary | ICD-10-CM | POA: Diagnosis not present

## 2013-01-10 DIAGNOSIS — K56609 Unspecified intestinal obstruction, unspecified as to partial versus complete obstruction: Secondary | ICD-10-CM | POA: Diagnosis not present

## 2013-01-10 DIAGNOSIS — I10 Essential (primary) hypertension: Secondary | ICD-10-CM | POA: Diagnosis not present

## 2013-01-10 DIAGNOSIS — R262 Difficulty in walking, not elsewhere classified: Secondary | ICD-10-CM | POA: Diagnosis not present

## 2013-01-10 DIAGNOSIS — M6281 Muscle weakness (generalized): Secondary | ICD-10-CM | POA: Diagnosis not present

## 2013-01-10 DIAGNOSIS — K5730 Diverticulosis of large intestine without perforation or abscess without bleeding: Secondary | ICD-10-CM | POA: Diagnosis not present

## 2013-01-10 DIAGNOSIS — K5649 Other impaction of intestine: Secondary | ICD-10-CM | POA: Diagnosis not present

## 2013-01-10 DIAGNOSIS — J189 Pneumonia, unspecified organism: Secondary | ICD-10-CM | POA: Diagnosis not present

## 2013-01-10 DIAGNOSIS — Z5189 Encounter for other specified aftercare: Secondary | ICD-10-CM | POA: Diagnosis not present

## 2013-01-10 DIAGNOSIS — K219 Gastro-esophageal reflux disease without esophagitis: Secondary | ICD-10-CM | POA: Diagnosis not present

## 2013-01-10 DIAGNOSIS — K2270 Barrett's esophagus without dysplasia: Secondary | ICD-10-CM | POA: Diagnosis not present

## 2013-01-10 LAB — GLUCOSE, CAPILLARY: Glucose-Capillary: 84 mg/dL (ref 70–99)

## 2013-01-10 MED ORDER — TRANDOLAPRIL 2 MG PO TABS: 2.0000 mg | ORAL_TABLET | Freq: Two times a day (BID) | ORAL | Status: DC

## 2013-01-10 MED ORDER — EST ESTROGENS-METHYLTEST 0.625-1.25 MG PO TABS: 1.0000 | ORAL_TABLET | Freq: Every day | ORAL | Status: DC

## 2013-01-10 MED ORDER — OXYCODONE HCL 5 MG PO TABS: ORAL_TABLET | ORAL | Status: DC

## 2013-01-10 NOTE — Progress Notes (Signed)
Patient cleared for discharge. Packet copied and placed in [place]. CSW met with patient. Patient happy to finally be discharged and is hopeful that rehab at [facility] will help her get stronger. ptar called for transportation. Patient uderstands no guarantee of payment.  [name] C. Josephine Rudnick MSW, LCSW [phone]

## 2013-01-10 NOTE — Discharge Summary (Signed)
Mild erythema and skin staple sites only.  Appetite not normal but tolerating supplemental shakes.  Discharge to skilled facility for rehabilitation.  Spent some time discussing with her about needing several months for recovery given 2 emergency surgeries. I am hopeful she will continue to improve. Certainly she is made marked strides over the past week. She is nervous but trying to be hopeful as well.  Have her followup in clinic next week to remove her staples and continued close followup with [place].

## 2013-01-10 NOTE — Progress Notes (Signed)
Occupational Therapy Treatment Patient Details Name: [name] MRN: [id number] DOB: May 23, 1932 Today's Date: 01/10/2013 Time: 5621-3086 OT Time Calculation (min): 23 min  OT Assessment / Plan / Recommendation  History of present illness 79 yo female admitted 12/22/12. S/P explaoratory lap, SB resection, primary repair strangulated femoral hernia on 12/25/12. 01/04/13 underwent additonal exploratory laproscopic abdominal surgery.   OT comments  Pt presents w/ decreased activity tolerance and deconditioning overall during ADL's. Pt required moderate vc's for participation in treatment session today. She was educated in role of OT & goals were reviewed w/ pt, whom then participated reluctantly. She will benefit from SNF Rehab and education/implementation in energy conservation techniques.   Follow Up Recommendations  SNF;Supervision/Assistance - 24 hour;Home health OT    Barriers to Discharge       Equipment Recommendations  None recommended by OT    Recommendations for Other Services    Frequency Min 2X/week   Progress towards OT Goals Progress towards OT goals: Progressing toward goals  Plan Discharge plan remains appropriate    Precautions / Restrictions Precautions Precautions: Fall Restrictions Weight Bearing Restrictions: No   Pertinent Vitals/Pain Pt denies pain, however c/o "uncomfortable or ache, not pain" abdomen/surgical area. Repositioned & rest after activity.    ADL  Eating/Feeding: Performed;Independent Where Assessed - Eating/Feeding: Chair Grooming: Performed;Wash/dry hands;Wash/dry face;Set up Where Assessed - Grooming: Supported sitting Upper Body Bathing: Performed;Chest;Right arm;Left arm;Set up Where Assessed - Upper Body Bathing: Unsupported sitting Lower Body Bathing: Performed;Moderate assistance Where Assessed - Lower Body Bathing: Supported sit to stand Upper Body Dressing: Performed;Set up;Minimal assistance (Secondary to IV/lines) Where  Assessed - Upper Body Dressing: Unsupported sitting Toilet Transfer: Performed;Min guard Toilet Transfer Method: Sit to Barista: Comfort height toilet;Grab bars Toileting - Clothing Manipulation and Hygiene: Performed;Min guard Where Assessed - Glass blower/designer Manipulation and Hygiene: Standing Transfers/Ambulation Related to ADLs: Pt ambulated to bathroom and tranfered to comfort height toilet w/ Min guard assist. Fatigues easily ADL Comments: Pt fatigues quickly during ADL's. Decreased activity tolerance and deconditioning overall. Pt required moderate vc's for participation in treatment session today. She was educated in role of OT & goals were reviewed w/ pt, whom then participated reluctantly. She will benefit from SNF Rehab and education/implementation in energy conservation techniques.    OT Diagnosis:    OT Problem List:   OT Treatment Interventions:     OT Goals(current goals can now be found in the care plan section)    Visit Information  Last OT Received On: 01/10/13 Assistance Needed: +1 History of Present Illness: 79 yo female admitted 12/22/12. S/P explaoratory lap, SB resection, primary repair strangulated femoral hernia on 12/25/12. 01/04/13 underwent additonal exploratory laproscopic abdominal surgery.    Subjective Data      Prior Functioning       Cognition  Cognition Arousal/Alertness: Awake/alert Behavior During Therapy: WFL for tasks assessed/performed Overall Cognitive Status: Within Functional Limits for tasks assessed    Mobility  Bed Mobility Bed Mobility: Not assessed Transfers Transfers: Sit to Stand;Stand to Sit Sit to Stand: 4: Min guard;From chair/3-in-1;With upper extremity assist;4: Min assist;From toilet Stand to Sit: 4: Min guard;To chair/3-in-1;With upper extremity assist;To toilet Details for Transfer Assistance: verbal cues for hand placement, safety and sequencing.         Balance Balance Balance Assessed:  Yes Static Standing Balance Static Standing - Balance Support: No upper extremity supported Static Standing - Level of Assistance: 5: Stand by assistance Dynamic Standing Balance Dynamic Standing - Balance  Support: No upper extremity supported (During ADL's, LB bathing/peri care) Dynamic Standing - Level of Assistance: 5: Stand by assistance   End of Session OT - End of Session Activity Tolerance: Patient limited by fatigue Patient left: in chair;with call bell/phone within reach;with chair alarm set Nurse Communication: Mobility status  GO     [name] 01/10/2013, 8:18 AM

## 2013-01-10 NOTE — Clinical Social Work Placement (Signed)
     Clinical Social Work Department CLINICAL SOCIAL WORK PLACEMENT NOTE 01/10/2013  Patient:  [name], [name]  Account Number:  [id number] Admit date:  12/22/2012  Clinical Social Worker:  Unk Lightning, LCSW  Date/time:  12/28/2012 10:30 AM  Clinical Social Work is seeking post-discharge placement for this patient at the following level of care:   SKILLED NURSING   (*CSW will update this form in Epic as items are completed)   12/28/2012  Patient/family provided with [facility] Department of Clinical Social Works list of facilities offering this level of care within the geographic area requested by the patient (or if unable, by the patients family).  12/28/2012  Patient/family informed of their freedom to choose among providers that offer the needed level of care, that participate in Medicare, Medicaid or managed care program needed by the patient, have an available bed and are willing to accept the patient.  12/28/2012  Patient/family informed of [facility] ownership interest in [facility], as well as of the fact that they are under no obligation to receive care at this facility.  PASARR submitted to EDS on 12/28/2012 PASARR number received from [employer] on 12/28/2012  FL2 transmitted to all facilities in geographic area requested by pt/family on  12/28/2012 FL2 transmitted to all facilities within larger geographic area on   Patient informed that his/her managed care company has contracts with or will negotiate with  certain facilities, including the following:     Patient/family informed of bed offers received:  01/10/2013 Patient chooses bed at [facility] Physician recommends and patient chooses bed at    Patient to be transferred to [facility] on  01/10/2013 Patient to be transferred to facility by ptar  The following physician request were entered in Epic:   Additional Comments:

## 2013-01-11 ENCOUNTER — Telehealth (INDEPENDENT_AMBULATORY_CARE_PROVIDER_SITE_OTHER): Payer: Self-pay

## 2013-01-11 NOTE — Telephone Encounter (Signed)
I called back and tried to reach [name] and he did not answer.  I told him I returned his call and that our phones turn off at 5pm.  He can reach me back tomorrow after 0830.  Please page me to pick up call.

## 2013-01-11 NOTE — Telephone Encounter (Signed)
Pt significant other called back. I asked for pt's information and he got mad because he only wanted to talk to [place]. Advised I would need to give Huntley Dec a message and only way to do that is pull up pt account and I would need her DOB and name. He finaly gave me the information but did not want me to relay the appointment info to him. He would like for Huntley Dec ONLY to call him back.

## 2013-01-11 NOTE — Telephone Encounter (Signed)
Tried to reach pt on her ph #, no answer.  I then tried to reach her significant other.  I left a message for either one of them to call me.  I have scheduled her po appointment with Dr Derrell Lolling on 01/25/2013 at 0830.  She needs to arrive at 0800 to check in and get registered.

## 2013-01-12 NOTE — Telephone Encounter (Signed)
The pt called back to see if she can shower.  I told her she can because she has staples and no open wound.  I told her to gently wash with soap and water in the shower and let the water rinse over it.  Gently pat it dry when she gets out.  There is no need to cover her incision unless she has some drainage or it gets irritated by her clothes.

## 2013-01-12 NOTE — Telephone Encounter (Signed)
Pt called me back.  She is now home doing well.  She left the rehab facility.   I confirmed her appointment with Dr Biagio Quint with her.  She asked to schedule an appointment Tuesday for staple removal.  I scheduled her to come in at 3:30 pm for nurse visit.  She will see Dr Biagio Quint on 11/21.

## 2013-01-12 NOTE — Telephone Encounter (Signed)
Mr [name] returned my call.  He states the pt is in [facility].  She doesn't like it there and has been through a lot.  I offered him the follow up appointment with Dr Derrell Lolling on 11/20. He refused that and states the pt only wants to see Dr Biagio Quint.  I scheduled with him for 11/21 at 11:45.  He said he will give it to the pt and see if she can make that day and time.  He will call back if needed.

## 2013-01-15 ENCOUNTER — Other Ambulatory Visit: Payer: Self-pay | Admitting: *Deleted

## 2013-01-15 MED ORDER — TRANDOLAPRIL 2 MG PO TABS: 2.0000 mg | ORAL_TABLET | Freq: Two times a day (BID) | ORAL | Status: DC

## 2013-01-16 ENCOUNTER — Ambulatory Visit (INDEPENDENT_AMBULATORY_CARE_PROVIDER_SITE_OTHER): Payer: Medicare Other | Admitting: General Surgery

## 2013-01-16 DIAGNOSIS — L089 Local infection of the skin and subcutaneous tissue, unspecified: Secondary | ICD-10-CM

## 2013-01-16 MED ORDER — SULFAMETHOXAZOLE-TRIMETHOPRIM 400-80 MG PO TABS: 1.0000 | ORAL_TABLET | Freq: Two times a day (BID) | ORAL | Status: AC

## 2013-01-24 ENCOUNTER — Encounter: Payer: Self-pay | Admitting: Internal Medicine

## 2013-01-24 ENCOUNTER — Ambulatory Visit (INDEPENDENT_AMBULATORY_CARE_PROVIDER_SITE_OTHER): Payer: Medicare Other | Admitting: Internal Medicine

## 2013-01-24 VITALS — BP 118/64 | HR 66 | Temp 97.4°F | Wt 130.4 lb

## 2013-01-24 DIAGNOSIS — K4140 Unilateral femoral hernia, with gangrene, not specified as recurrent: Secondary | ICD-10-CM

## 2013-01-24 DIAGNOSIS — E039 Hypothyroidism, unspecified: Secondary | ICD-10-CM

## 2013-01-24 DIAGNOSIS — F3289 Other specified depressive episodes: Secondary | ICD-10-CM

## 2013-01-24 DIAGNOSIS — J189 Pneumonia, unspecified organism: Secondary | ICD-10-CM

## 2013-01-24 DIAGNOSIS — E440 Moderate protein-calorie malnutrition: Secondary | ICD-10-CM | POA: Diagnosis not present

## 2013-01-24 DIAGNOSIS — R634 Abnormal weight loss: Secondary | ICD-10-CM

## 2013-01-24 DIAGNOSIS — F329 Major depressive disorder, single episode, unspecified: Secondary | ICD-10-CM | POA: Diagnosis not present

## 2013-01-24 NOTE — Patient Instructions (Addendum)
Continue current medications Consider resuming antidepressant. Go for follow-up appt with surgeon as planned.

## 2013-01-24 NOTE — Progress Notes (Signed)
Subjective:    Patient ID: [name], female    DOB: 1932-05-02, 79 y.o.   MRN: [id number]  Chief Complaint  Patient presents with  . Hospitalization Follow-up    hospital f/u  . Immunizations    ? on getting the Flu vaccine    HPI Patient was hospitalized 12/22/2012 through November 2014. She underwent 2 surgeries for repair of right femoral hernia.  She had a significant period of postoperative ileus.   She had pneumonia while hospitalized. She was sent to [facility] skilled nursing facility for rehabilitation, but signed herself out after less than 48 hours.  Tearful. Sad. "I think my life is ruined. "  Able to eat. No nausea. Has resumed driving, washing dishes, dressing, and showering. Fixing soup and eggs at home. She eats out some meals.  She thinks her voice has changed. Initially, it was weak and now is stronger, but she feels like she is hoarse.  Staples removed 01/16/2013.was put on antibiotic, Septra-DS. No pain in the RLQ of the abd. No pain in the vaginal area. Still some erythema at the scar.  Current Outpatient Prescriptions on File Prior to Visit  Medication Sig Dispense Refill  . acetaminophen (TYLENOL) 500 MG tablet Take 500 mg by mouth every 6 (six) hours as needed for pain. For pain      . Ascorbic Acid (VITAMIN C) 1000 MG tablet Take 1,000 mg by mouth daily.      . Biotin 1000 MCG tablet Take 5,000 mcg by mouth daily.       . Calcium Carbonate-Vitamin D (CALCIUM + D PO) Take 1 tablet by mouth daily. 1200mg  of Calcium and 1000mg  Vit D      . Cyanocobalamin (VITAMIN B 12 PO) Take 1 tablet by mouth daily.       . digoxin (LANOXIN) 0.125 MG tablet Take 1 tablet (125 mcg total) by mouth daily.  90 tablet  3  . ESTRACE VAGINAL 0.1 MG/GM vaginal cream Place 1 g vaginally 2 (two) times a week.       . estrogen-methylTESTOSTERone (COVARYX HS) 0.625-1.25 MG per tablet Take 1 tablet by mouth daily.  30 tablet  5  . estrogen-methylTESTOSTERone (ESTRATEST HS)  0.625-1.25 MG per tablet Take 1 tablet by mouth daily.       . furosemide (LASIX) 20 MG tablet Take 20 mg by mouth daily as needed. For fluid retention      . levothyroxine (SYNTHROID, LEVOTHROID) 112 MCG tablet Take 112 mcg by mouth daily.        . Magnesium 250 MG TABS Take 1 tablet by mouth daily.       . metoprolol tartrate (LOPRESSOR) 25 MG tablet Take 12.5 mg by mouth daily.       . Multiple Vitamin (MULTIVITAMIN WITH MINERALS) TABS Take 1 tablet by mouth daily.      Marland Kitchen OVER THE COUNTER MEDICATION Place 1 drop into both eyes daily as needed. OTC Dry Eyes Eyedrop  For dry eyes      . potassium chloride SA (K-DUR,KLOR-CON) 20 MEQ tablet Take 20 mEq by mouth daily as needed. When taking the lasix      . RABEprazole (ACIPHEX) 20 MG tablet Take 1 tablet (20 mg total) by mouth daily.  90 tablet  0  . trandolapril (MAVIK) 2 MG tablet Take 1 tablet (2 mg total) by mouth 2 (two) times daily. Resume this medication  60 tablet  3  . [DISCONTINUED] omeprazole (PRILOSEC) 20 MG capsule Take  1 capsule (20 mg total) by mouth daily.  30 capsule  11   No current facility-administered medications on file prior to visit.    Review of Systems  Constitutional: Positive for appetite change, fatigue and unexpected weight change. Negative for fever and chills.  HENT: Positive for congestion, hearing loss, postnasal drip, rhinorrhea and sinus pressure. Negative for ear discharge, ear pain, facial swelling, nosebleeds, sneezing and tinnitus.   Eyes: Negative.   Respiratory: Positive for cough and shortness of breath. Negative for wheezing.   Cardiovascular: Positive for palpitations. Negative for chest pain and leg swelling.  Gastrointestinal: Negative for nausea, abdominal pain, diarrhea, blood in stool and abdominal distention.       Hx erosive gastritis, HH, Barrett's esophagus. Persistent, uncomfortable lump in her right groin.  Endocrine: Negative.   Genitourinary:       Nocturia x 3. Vaginal discomfort.  Uses hormone cream from GYN, Dr. Jennette Kettle  Musculoskeletal: Positive for arthralgias, back pain, neck pain and neck stiffness. Negative for gait problem, joint swelling and myalgias.  Skin: Negative for pallor, rash and wound.       S/P right herniorraphy. Right index finger has onycholysis distally and tenderness.  Neurological: Positive for dizziness, numbness and headaches. Negative for tremors, seizures, syncope and speech difficulty.  Hematological: Negative.   Psychiatric/Behavioral: Positive for dysphoric mood. The patient is nervous/anxious.        Objective:BP 118/64  Pulse 66  Temp(Src) 97.4 F (36.3 C) (Oral)  Wt 130 lb 6.4 oz (59.149 kg)  SpO2 96%    Physical Exam  Constitutional:  Frail, thin body. Losing weight.  HENT:  Right Ear: External ear normal.  Left Ear: External ear normal.  Mouth/Throat: Oropharynx is clear and moist.  Nasal polyp  Eyes:  Corrective lensees  Neck: No JVD present. No tracheal deviation present. No thyromegaly present.  stiff  Cardiovascular: Normal rate and regular rhythm.  Exam reveals no gallop and no friction rub.   No murmur heard. AICD in left upper chest. Raynaud's disease.  Pulmonary/Chest: Effort normal. No respiratory distress. She has no wheezes. She has rales. She exhibits no tenderness.  Abdominal: Soft. Bowel sounds are normal. She exhibits no distension and no mass. There is no tenderness.  Right inguinal area has scar from surgery. There remains a lump that is somewhat tender.  Musculoskeletal: Normal range of motion. She exhibits no edema.  Tender right knee.   Lymphadenopathy:    She has no cervical adenopathy.  Neurological:  Reduced vibratory sensation in both feet.  Skin: No rash noted. No erythema. No pallor.  Onycholysis of right index fingeer. Tender to palpation.  Psychiatric:  Anxious. Depressed affect. Low energy.     Admission on 12/22/2012, Discharged on 01/10/2013  No results displayed because visit  has over 200 results.    Admission on 12/20/2012, Discharged on 12/21/2012  Component Date Value Range Status  . WBC 12/20/2012 15.0* 4.0 - 10.5 K/uL Final  . RBC 12/20/2012 4.94  3.87 - 5.11 MIL/uL Final  . Hemoglobin 12/20/2012 15.3* 12.0 - 15.0 g/dL Final  . HCT 82/95/6213 43.0  36.0 - 46.0 % Final  . MCV 12/20/2012 87.0  78.0 - 100.0 fL Final  . MCH 12/20/2012 31.0  26.0 - 34.0 pg Final  . MCHC 12/20/2012 35.6  30.0 - 36.0 g/dL Final  . RDW 08/65/7846 12.7  11.5 - 15.5 % Final  . Platelets 12/20/2012 227  150 - 400 K/uL Final  . Neutrophils Relative % 12/20/2012 85*  43 - 77 % Final  . Neutro Abs 12/20/2012 12.7* 1.7 - 7.7 K/uL Final  . Lymphocytes Relative 12/20/2012 6* 12 - 46 % Final  . Lymphs Abs 12/20/2012 0.9  0.7 - 4.0 K/uL Final  . Monocytes Relative 12/20/2012 9  3 - 12 % Final  . Monocytes Absolute 12/20/2012 1.3* 0.1 - 1.0 K/uL Final  . Eosinophils Relative 12/20/2012 0  0 - 5 % Final  . Eosinophils Absolute 12/20/2012 0.0  0.0 - 0.7 K/uL Final  . Basophils Relative 12/20/2012 0  0 - 1 % Final  . Basophils Absolute 12/20/2012 0.0  0.0 - 0.1 K/uL Final  . Total Protein 12/20/2012 7.1  6.0 - 8.3 g/dL Final  . Albumin 16/12/9602 4.1  3.5 - 5.2 g/dL Final  . AST 54/11/8117 24  0 - 37 U/L Final  . ALT 12/20/2012 15  0 - 35 U/L Final  . Alkaline Phosphatase 12/20/2012 90  39 - 117 U/L Final  . Total Bilirubin 12/20/2012 0.9  0.3 - 1.2 mg/dL Final  . Bilirubin, Direct 12/20/2012 0.2  0.0 - 0.3 mg/dL Final  . Indirect Bilirubin 12/20/2012 0.7  0.3 - 0.9 mg/dL Final  . Lipase 14/78/2956 15  11 - 59 U/L Final  . Troponin I 12/20/2012 <0.30  <0.30 ng/mL Final   Comment:                                 Due to the release kinetics of cTnI,                          a negative result within the first hours                          of the onset of symptoms does not rule out                          myocardial infarction with certainty.                          If myocardial  infarction is still suspected,                          repeat the test at appropriate intervals.  . Lactic Acid, Venous 12/20/2012 1.25  0.5 - 2.2 mmol/L Final  . Sodium 12/20/2012 131* 135 - 145 mEq/L Final  . Potassium 12/20/2012 3.9  3.5 - 5.1 mEq/L Final  . Chloride 12/20/2012 97  96 - 112 mEq/L Final  . BUN 12/20/2012 15  6 - 23 mg/dL Final  . Creatinine, Ser 12/20/2012 0.80  0.50 - 1.10 mg/dL Final  . Glucose, Bld 21/30/8657 112* 70 - 99 mg/dL Final  . Calcium, Ion 84/69/6295 1.10* 1.13 - 1.30 mmol/L Final  . TCO2 12/20/2012 23  0 - 100 mmol/L Final  . Hemoglobin 12/20/2012 16.3* 12.0 - 15.0 g/dL Final  . HCT 28/41/3244 48.0* 36.0 - 46.0 % Final  Appointment on 12/07/2012  Component Date Value Range Status  . TSH 12/07/2012 1.280  0.450 - 4.500 uIU/mL Final  . Glucose 12/07/2012 90  65 - 99 mg/dL Final  . BUN 03/10/7251 17  8 - 27 mg/dL Final  . Creatinine, Ser 12/07/2012 0.67  0.57 - 1.00 mg/dL Final  .  GFR calc non Af Amer 12/07/2012 84  >59 mL/min/1.73 Final  . GFR calc Af Amer 12/07/2012 97  >59 mL/min/1.73 Final  . BUN/Creatinine Ratio 12/07/2012 25  11 - 26 Final  . Sodium 12/07/2012 136  134 - 144 mmol/L Final  . Potassium 12/07/2012 4.5  3.5 - 5.2 mmol/L Final  . Chloride 12/07/2012 97  97 - 108 mmol/L Final  . CO2 12/07/2012 24  18 - 29 mmol/L Final  . Calcium 12/07/2012 9.0  8.6 - 10.2 mg/dL Final  . Total Protein 12/07/2012 5.8* 6.0 - 8.5 g/dL Final  . Albumin 81/19/1478 4.0  3.5 - 4.8 g/dL Final  . Globulin, Total 12/07/2012 1.8  1.5 - 4.5 g/dL Final  . Albumin/Globulin Ratio 12/07/2012 2.2  1.1 - 2.5 Final  . Total Bilirubin 12/07/2012 0.3  0.0 - 1.2 mg/dL Final  . Alkaline Phosphatase 12/07/2012 88  39 - 117 IU/L Final  . AST 12/07/2012 19  0 - 40 IU/L Final  . ALT 12/07/2012 16  0 - 32 IU/L Final         Assessment & Plan:  1. Depressive disorder, not elsewhere classified Patient has a significant depression. Recent events with multiple surgeries of  right inguinal hernia and a right femoral hernia and left her weak, debilitated, and with weight loss. Patient has problems historically with depression and anxiety. This has gotten worse. She has no interest in trying an antidepressant. She continues to have the citalopram previously ordered. She says that she will try this at my suggestion.  2. HCAP (healthcare-associated pneumonia) Denies cough or fever. Appears resolved.  3. Malnutrition of moderate degree Patient is resuming her usual diet gradually.  4. Hypothyroidism Stable  5. Femoral hernia with gangrene and obstruction Recent repair with 2 emergency surgeries during her hospitalization 12/22/2012 through 01/10/2013. Surgical repairs were done 12/11/12, 12/25/12, and 01/04/13. There is a area of erythema and tenderness at the incision. Patient had been placed on antibiotics by her surgeon. She is highly concerned that there may be infection in this area. We attempted to reassure her.  6. Loss of weight Related to recent surgeries and ileus. Patient is beginning to be better. When she is recovered.

## 2013-01-25 ENCOUNTER — Encounter (INDEPENDENT_AMBULATORY_CARE_PROVIDER_SITE_OTHER): Payer: Medicare Other | Admitting: General Surgery

## 2013-01-26 ENCOUNTER — Encounter (INDEPENDENT_AMBULATORY_CARE_PROVIDER_SITE_OTHER): Payer: Self-pay | Admitting: General Surgery

## 2013-01-26 ENCOUNTER — Ambulatory Visit (INDEPENDENT_AMBULATORY_CARE_PROVIDER_SITE_OTHER): Payer: Medicare Other | Admitting: General Surgery

## 2013-01-26 VITALS — BP 122/68 | HR 64 | Temp 98.0°F | Resp 18 | Ht 69.0 in | Wt 131.0 lb

## 2013-01-26 DIAGNOSIS — Z4889 Encounter for other specified surgical aftercare: Secondary | ICD-10-CM

## 2013-01-26 DIAGNOSIS — Z5189 Encounter for other specified aftercare: Secondary | ICD-10-CM

## 2013-01-26 NOTE — Progress Notes (Signed)
Subjective:     Patient ID: [name], female   DOB: 13-Mar-1932, 79 y.o.   MRN: [id number]  HPI This patient follows up for her first postoperative visit status post diagnostic laparoscopy and open repair of strangulated right femoral hernia. She also had a smaller resection at that time. Her hospital course was complicated with 2 prior operations for similar problems. She had persistent bowel obstruction due to bowel incarceration and strangulation. Since her discharge she says that she has been eating okay and her bowels are functioning normally. She has not had any more nausea or vomiting. Her abdomen and has not been distended she does have some drainage from her midline wound. She has some mild discomfort in the area but no fevers or chills she just finished a course of antibiotics today.  She has no groin bulge and denies any groin pain or pain radiating to the leg  Review of Systems     Objective:   Physical Exam She is in no acute distress and nontoxic-appearing she is sitting comfortably on the bed. Her abdomen is soft without any significant tenderness. Her Incision is healing nicely. She does have a small 1 inch area in her lower midline with a palpable seroma and a thin layer of skin over top of this with a small punctate hole from which I can express straw-colored serous fluid. There is no evidence of erythema, purulence, or infection.  There is no evidence of recurrent hernia in the groin.    Assessment:     Status post repair of strangulated right femoral hernia-doing well There is no evidence of recurrent hernia or persistent obstruction. It sounds like she is recovering fine. She is still fairly distraught about the 2 previous surgeries which led to persistent problems. I tried to reassure her that everything looks well and although I cannot guarantee that there is no recurrence of her hernia, everything seems to look appropriate. She has a small seroma at the lower portion of  her wound which is draining small amount of straw-colored fluid and does not appear infected. She is also very concerned about this and again, I do not think that this shows any evidence of infection. I would not recommend any antibiotics for this and although I explained that this can become infected I do not see any indication for antibiotics at this time.    Plan:     Continue with current wound care and the seroma should resolve over time. I recommended that she continue light duty for another 2 weeks and then she can gradually increase her activity as tolerated. Recommended that she continue to increase her activity and try to get back to her normal activities but this point she says that she has been very fearful to do that for fear that she might cause a problem or have recurrent symptoms. Again, everything looks appropriate and hopefully she can back to her regular activities very soon. I will see her back in about 2 weeks for repeat evaluation and wound check

## 2013-01-29 ENCOUNTER — Encounter: Payer: Self-pay | Admitting: Nurse Practitioner

## 2013-01-29 ENCOUNTER — Ambulatory Visit (INDEPENDENT_AMBULATORY_CARE_PROVIDER_SITE_OTHER): Payer: Medicare Other | Admitting: Nurse Practitioner

## 2013-01-29 VITALS — BP 120/60 | HR 55 | Ht 69.0 in | Wt 132.4 lb

## 2013-01-29 DIAGNOSIS — I5022 Chronic systolic (congestive) heart failure: Secondary | ICD-10-CM | POA: Diagnosis not present

## 2013-01-29 NOTE — Patient Instructions (Signed)
See Dr. Ladona Ridgel in February  See Dr. Swaziland in February with an echo prior  Stay on your current medicines  Call the [facility] office at [phone] if you have any questions, problems or concerns.

## 2013-01-29 NOTE — Progress Notes (Signed)
[name] Date of Birth: Dec 16, 1932 Medical Record #[id number]  History of Present Illness: [name] is seen back today for a post hospital visit. Seen for Dr. Swaziland. She has multiple medical issues which include a cardiomyopathy, underlying BiV/ICD which resulted in improvement of her EF, LBBB, HTN, thyroid disease, melanoma, anxiety/depression and chronic systolic HF. EF is 45 to 50%.   Most recently admitted with N/V - admitted with ileus versus SBO - required surgery for subsequent incarcerated femoral hernia with SBO. Post op course complicated by pneumonia, leukocytosis. Did require a 2nd surgery due to continued ileus and found to have a femoral hernia containing incarcerated small bowel anastomosis with a definite transition point - ultimately had diagnostic lap converted to ex lap with SBR and femoral hernia repair. Did have some NSVT noted and was seen by Dr. Johney Frame in consultation - his recommendations were as follows - 1. Wide complex rhythm I have reviewed telemetry extensively. I think that most of the wide complex rhythm that is seen is actually her intrinsic (non biv paced) rhythm. This will occur when her sinus rate goes above the tracking rate of her pacemaker or when her AV delay shortens to less than the rate of the pacemaker. These two events will occur when adrenergic tone is increased. There are also some episodes of documented NSVT as well. For now, we ill treat with IV lopressor. I will also have her PPM interogated to see if adjustments can be made. I think that the above is benign. We will follow. 2. Nonischemic CM/ chronic systolic dysfunction EF previously improved with CRT. Would keep Is and Os about even. Caution with IV fluid,   She was managed with beta blocker and digoxin. Discharged to SNF.   Comes back today. Here alone. Seems ok from a cardiac standpoint. She denies shortness of breath. Weight is up just a pound but she is trying to eat more. She is hoarse -  attributes that from the ET tube that she had twice. She is quite angry about her recent hospitalization - very tearful. No new medicines from our standpoint.    Current Outpatient Prescriptions  Medication Sig Dispense Refill  . acetaminophen (TYLENOL) 500 MG tablet Take 500 mg by mouth every 6 (six) hours as needed for pain. For pain      . Ascorbic Acid (VITAMIN C) 1000 MG tablet Take 1,000 mg by mouth daily.      . Biotin 1000 MCG tablet Take 5,000 mcg by mouth daily.       . Calcium Carbonate-Vitamin D (CALCIUM + D PO) Take 1 tablet by mouth daily. 1200mg  of Calcium and 1000mg  Vit D      . Cyanocobalamin (VITAMIN B 12 PO) Take 1 tablet by mouth daily.       . digoxin (LANOXIN) 0.125 MG tablet Take 1 tablet (125 mcg total) by mouth daily.  90 tablet  3  . ESTRACE VAGINAL 0.1 MG/GM vaginal cream Place 1 g vaginally 2 (two) times a week.       . estrogen-methylTESTOSTERone (COVARYX HS) 0.625-1.25 MG per tablet Take 1 tablet by mouth daily.  30 tablet  5  . furosemide (LASIX) 20 MG tablet Take 20 mg by mouth daily as needed. For fluid retention      . levothyroxine (SYNTHROID, LEVOTHROID) 112 MCG tablet Take 112 mcg by mouth daily.        . Magnesium 250 MG TABS Take 1 tablet by mouth daily.       Marland Kitchen  metoprolol tartrate (LOPRESSOR) 25 MG tablet Take 12.5 mg by mouth daily.       . Multiple Vitamin (MULTIVITAMIN WITH MINERALS) TABS Take 1 tablet by mouth daily.      Marland Kitchen OVER THE COUNTER MEDICATION Place 1 drop into both eyes daily as needed. OTC Dry Eyes Eyedrop  For dry eyes      . potassium chloride SA (K-DUR,KLOR-CON) 20 MEQ tablet Take 20 mEq by mouth daily as needed. When taking the lasix      . RABEprazole (ACIPHEX) 20 MG tablet Take 1 tablet (20 mg total) by mouth daily.  90 tablet  0  . trandolapril (MAVIK) 2 MG tablet Take 1 tablet (2 mg total) by mouth 2 (two) times daily. Resume this medication  60 tablet  3  . [DISCONTINUED] omeprazole (PRILOSEC) 20 MG capsule Take 1 capsule (20 mg  total) by mouth daily.  30 capsule  11   No current facility-administered medications for this visit.    Allergies  Allergen Reactions  . Hydrocodone Itching  . Cephalexin Itching and Swelling  . Doxycycline Other (See Comments)    Per pt: unknown  . Ofloxacin Other (See Comments)    Per pt: unknown  . Tape Other (See Comments)    Burns skin.   . Latex Rash    Past Medical History  Diagnosis Date  . Nonischemic cardiomyopathy     EF initially 20%; Last measurement up to 50% per echo in August of 2012  . CHF (congestive heart failure)   . LBBB (left bundle branch block)   . Mitral regurgitation   . Melanoma     right arm  . Hypothyroidism   . Erosive gastritis 08/2004  . Hyperplastic polyps of stomach 06/2002  . Barrett's esophagus 04/2008  . GERD (gastroesophageal reflux disease)   . Diverticulitis   . Anxiety disorder   . HTN (hypertension)   . ICD (implantable cardiac defibrillator) in place     BiV/ICD; s/p removal of ICD with insertion of BiV PPM 04/26/12  . Hair thinning   . Unspecified sinusitis (chronic)   . Unspecified chronic bronchitis   . Depressive disorder, not elsewhere classified   . Restless legs syndrome (RLS)   . Chronic airway obstruction, not elsewhere classified   . Benign neoplasm of colon   . Other and unspecified hyperlipidemia   . Unspecified nasal polyp   . Pain in joint, lower leg   . Synovial cyst of popliteal space   . Complication of anesthesia     slow to wake up  . Pacemaker 04/26/2012  . Sleep apnea   . Other and unspecified hyperlipidemia   . Dysthymic disorder   . Palpitations     Past Surgical History  Procedure Laterality Date  . Defibrillator insertion      s/p removal of previously implanted BiV ICD and insertion of a new BiV pacemaker on 04/26/12  . Left arm surgery    . Abdominal hysterectomy  1076    endometriosis  . Right breast lumpectomy  1988    Dr. Francina Ames  . Appendectomy    . Tonsillectomy    . Right  knee surgery      arthroscopy: Dr. Simonne Come  . Excision of wen  1984    Dr. Valrie Hart  . Excise mole of lip  2001    Dr. Everlene Farrier  . Inguinal hernia repair Right 09/26/2012    Procedure: RIGHT INGUINAL HERNIA REPAIR WITH MESH;  Surgeon: Adolph Pollack,  MD;  Location: [facility] OR;  Service: General;  Laterality: Right;  . Insertion of mesh Right 09/26/2012    Procedure: INSERTION OF MESH;  Surgeon: Adolph Pollack, MD;  Location[facility] OR;  Service: General;  Laterality: Right;  . Inguinal hernia repair Right 12/25/2012    Procedure: explor right groin, small bowel rescection, tissue repair right femoral hernia;  Surgeon: Ernestene Mention, MD;  Location: [facility] ORS;  Service: General;  Laterality: Right;  . Laparoscopy N/A 01/04/2013    Procedure: Diagnostic Laparoscopy, exploratory laparotomy with small bowel resection, closure of right femoral hernia repair;  Surgeon: Lodema Pilot, DO;  Location: [facility] ORS;  Service: General;  Laterality: N/A;  . Femoral hernia repair  12/25/2012    Dr Derrell Lolling  . Femoral hernia repair  01/04/2013    Recurrent - Dr. Biagio Quint    History  Smoking status  . Former Smoker  . Types: Cigarettes  . Quit date: 12/22/1993  Smokeless tobacco  . Never Used    Comment: Does'nt reall year quit     History  Alcohol Use No    Family History  Problem Relation Age of Onset  . Heart disease Other     maternal side  . Colon cancer Paternal Uncle   . Breast cancer Maternal Aunt   . Diabetes Neg Hx   . Stroke Father     Review of Systems: The review of systems is per the HPI.  All other systems were reviewed and are negative.  Physical Exam: BP 120/60  Pulse 55  Ht 5\' 9"  (1.753 m)  Wt 132 lb 6.4 oz (60.056 kg)  BMI 19.54 kg/m2  SpO2 99% Patient is very pleasant and in no acute distress. She is visibly upset - crying today.  Quite thin. Skin is warm and dry. Color is normal.  HEENT is unremarkable. Normocephalic/atraumatic. PERRL. Sclera are nonicteric. Neck is  supple. No masses. No JVD. Lungs are clear. Cardiac exam shows a regular rate and rhythm. Abdomen is soft. Extremities are without edema. Gait and ROM are intact. No gross neurologic deficits noted.  Wt Readings from Last 3 Encounters:  01/29/13 132 lb 6.4 oz (60.056 kg)  01/26/13 131 lb (59.421 kg)  01/24/13 130 lb 6.4 oz (59.149 kg)     LABORATORY DATA: Lab Results  Component Value Date   WBC 8.3 01/08/2013   HGB 10.4* 01/08/2013   HCT 29.4* 01/08/2013   PLT 343 01/08/2013   GLUCOSE 106* 01/08/2013   TRIG 52 01/08/2013   ALT 83* 01/08/2013   AST 82* 01/08/2013   NA 135 01/08/2013   K 3.6 01/08/2013   CL 102 01/08/2013   CREATININE 0.49* 01/08/2013   BUN 16 01/08/2013   CO2 27 01/08/2013   TSH 1.280 12/07/2012   INR 1.08 09/19/2012   Echo Study Conclusions from January 2014  - Left ventricle: The cavity size was normal. Wall thickness was normal. Systolic function was mildly reduced. The estimated ejection fraction was in the range of 45% to 50%. Diffuse hypokinesis. Left ventricular diastolic function parameters were normal. - Aortic valve: Trivial regurgitation. - Mitral valve: Mild regurgitation. - Left atrium: The atrium was mildly dilated.   Assessment / Plan: 1. Post op surgery - slow recovery.   2. Post op NSVT - on beta blocker and digoxin  3. Cardiomyopathy with EF of 45 to 50% - has BiV/ICD in place - for repeat echo in Jan/Feb with follow up with Dr. Swaziland and Dr. Ladona Ridgel at that time.  I think overall she is stable. No change in her medicines today.   Patient is agreeable to this plan and will call if any problems develop in the interim.   Rosalio Macadamia, RN, ANP-C [facility] [address] [place], [place]  [address]

## 2013-02-06 ENCOUNTER — Ambulatory Visit: Payer: Medicare Other | Admitting: Internal Medicine

## 2013-02-09 ENCOUNTER — Ambulatory Visit (INDEPENDENT_AMBULATORY_CARE_PROVIDER_SITE_OTHER): Payer: Medicare Other | Admitting: General Surgery

## 2013-02-09 ENCOUNTER — Encounter (INDEPENDENT_AMBULATORY_CARE_PROVIDER_SITE_OTHER): Payer: Self-pay | Admitting: General Surgery

## 2013-02-09 VITALS — BP 120/60 | HR 60 | Temp 98.4°F | Resp 14 | Ht 68.5 in | Wt 132.8 lb

## 2013-02-09 DIAGNOSIS — Z5189 Encounter for other specified aftercare: Secondary | ICD-10-CM

## 2013-02-09 DIAGNOSIS — Z4889 Encounter for other specified surgical aftercare: Secondary | ICD-10-CM

## 2013-02-09 NOTE — Progress Notes (Signed)
Subjective:     Patient ID: [name], female   DOB: 03/05/1933, 79 y.o.   MRN: [id number]  HPI This patient follows up today status post laparoscopic converted to open repair of incarcerated femoral hernia. She comes in today for wound check of her lower midline incision. She was concerned about some infection upon her last visit but she has not had any drainage or fevers or chills. There is no bulge but she does have some occasional discomfort in the area of her incision but she is not having any discomfort currently.  She remains very preoccupied with the thought that she could have recurrent hernia and this seems to be troubling her causing much anxiety  Review of Systems     Objective:   Physical Exam She is in no acute distress and sitting completely the chair Her abdomen is soft and nontender exam and nondistended her incision is healing very nicely. I do not appreciate any erythema or induration or drainage or any other sign of infection. I do not appreciate any bulge or sign of recurrent hernia either in the groin or under incision     Assessment:     Status post endoscopic averted open repair of incarcerated right femoral hernia-improving She seems to be improving and there is no evidence of any wound infection. I do not see any evidence of any recurrent hernia. She has been slow to fully recovered but I think that she is recovering appropriate given the fact that she had 3 operations in the last month. I think that she will continue to improve with time.     Plan:     Think that she can gradually increase her diet and activity as tolerated and she doesn't need to follow up with [place] unless she has any further questions.

## 2013-02-22 ENCOUNTER — Other Ambulatory Visit: Payer: Self-pay | Admitting: Gastroenterology

## 2013-03-08 DIAGNOSIS — Z23 Encounter for immunization: Secondary | ICD-10-CM | POA: Diagnosis not present

## 2013-03-27 DIAGNOSIS — Z85820 Personal history of malignant melanoma of skin: Secondary | ICD-10-CM | POA: Diagnosis not present

## 2013-03-27 DIAGNOSIS — L650 Telogen effluvium: Secondary | ICD-10-CM | POA: Diagnosis not present

## 2013-03-27 DIAGNOSIS — D239 Other benign neoplasm of skin, unspecified: Secondary | ICD-10-CM | POA: Diagnosis not present

## 2013-03-27 DIAGNOSIS — D1801 Hemangioma of skin and subcutaneous tissue: Secondary | ICD-10-CM | POA: Diagnosis not present

## 2013-03-27 DIAGNOSIS — L821 Other seborrheic keratosis: Secondary | ICD-10-CM | POA: Diagnosis not present

## 2013-03-27 DIAGNOSIS — L819 Disorder of pigmentation, unspecified: Secondary | ICD-10-CM | POA: Diagnosis not present

## 2013-04-10 ENCOUNTER — Ambulatory Visit (INDEPENDENT_AMBULATORY_CARE_PROVIDER_SITE_OTHER): Payer: Medicare Other | Admitting: Internal Medicine

## 2013-04-10 ENCOUNTER — Encounter: Payer: Self-pay | Admitting: Internal Medicine

## 2013-04-10 VITALS — BP 144/82 | HR 59 | Ht 68.5 in | Wt 135.0 lb

## 2013-04-10 DIAGNOSIS — I428 Other cardiomyopathies: Secondary | ICD-10-CM | POA: Diagnosis not present

## 2013-04-10 DIAGNOSIS — I5022 Chronic systolic (congestive) heart failure: Secondary | ICD-10-CM

## 2013-04-10 LAB — MDC_IDC_ENUM_SESS_TYPE_INCLINIC
Brady Statistic RA Percent Paced: 63 %
Brady Statistic RV Percent Paced: 98 %
Implantable Pulse Generator Serial Number: 100543
Lead Channel Impedance Value: 446 Ohm
Lead Channel Impedance Value: 519 Ohm
Lead Channel Impedance Value: 632 Ohm
Lead Channel Pacing Threshold Amplitude: 0.7 V
Lead Channel Pacing Threshold Amplitude: 1.2 V
Lead Channel Pacing Threshold Amplitude: 1.6 V
Lead Channel Pacing Threshold Pulse Width: 0.8 ms
Lead Channel Pacing Threshold Pulse Width: 1 ms
Lead Channel Sensing Intrinsic Amplitude: 14.1 mV
Lead Channel Sensing Intrinsic Amplitude: 2 mV
Lead Channel Sensing Intrinsic Amplitude: 7.2 mV
Lead Channel Setting Pacing Amplitude: 2 V
Lead Channel Setting Pacing Amplitude: 3.2 V
Lead Channel Setting Pacing Pulse Width: 1 ms
Lead Channel Setting Sensing Sensitivity: 2.5 mV
Lead Channel Setting Sensing Sensitivity: 2.5 mV
MDC IDC MSMT LEADCHNL RA PACING THRESHOLD PULSEWIDTH: 0.4 ms
MDC IDC SESS DTM: 20150203050000
MDC IDC SET LEADCHNL LV PACING AMPLITUDE: 2.2 V
MDC IDC SET LEADCHNL RV PACING PULSEWIDTH: 0.8 ms
Zone Setting Detection Interval: 375 ms

## 2013-04-10 NOTE — Progress Notes (Signed)
HPI [name] returns today for followup. She is a very pleasant 80 year old woman with a nonischemic cardiomyopathy, chronic systolic heart failure, left bundle branch block, status post biventricular pacemaker insertion. The patient recently had her ICD removed and a biventricular pacemaker placed without complication secondary to improvement in her LV function. In the interim, she complains of feeling bored but denies chest pain, shortness of breath, or peripheral edema. No syncope. She has had an extensive hospitalization for problems with a hernia which ultimately required 3 operations. She has residual pain in her abdominal area. No drainage, fever or chills. She is depressed and we spent 30 minutes today discussing the likely long term improvement we would expect her to receive. She is anxious. Allergies  Allergen Reactions  . Hydrocodone Itching  . Cephalexin Itching and Swelling  . Doxycycline Other (See Comments)    Per pt: unknown  . Ofloxacin Other (See Comments)    Per pt: unknown  . Tape Other (See Comments)    Burns skin.   . Latex Rash     Current Outpatient Prescriptions  Medication Sig Dispense Refill  . acetaminophen (TYLENOL) 500 MG tablet Take 500 mg by mouth every 6 (six) hours as needed for pain. For pain      . Ascorbic Acid (VITAMIN C) 1000 MG tablet Take 1,000 mg by mouth daily.      . Biotin 1000 MCG tablet Take 5,000 mcg by mouth daily.       . Calcium Carbonate-Vitamin D (CALCIUM + D PO) Take 1 tablet by mouth daily. 1200mg  of Calcium and 1000mg  Vit D      . Cyanocobalamin (VITAMIN B 12 PO) Take 1 tablet by mouth daily.       . digoxin (LANOXIN) 0.125 MG tablet Take 1 tablet (125 mcg total) by mouth daily.  90 tablet  3  . ESTRACE VAGINAL 0.1 MG/GM vaginal cream Place 1 g vaginally 2 (two) times a week.       . estrogen-methylTESTOSTERone (COVARYX HS) 0.625-1.25 MG per tablet Take 1 tablet by mouth daily.  30 tablet  5  . furosemide (LASIX) 20 MG tablet Take  20 mg by mouth daily as needed. For fluid retention      . levothyroxine (SYNTHROID, LEVOTHROID) 112 MCG tablet Take 112 mcg by mouth daily.        . Magnesium 250 MG TABS Take 1 tablet by mouth daily.       . metoprolol tartrate (LOPRESSOR) 25 MG tablet Take 12.5 mg by mouth daily.       . Multiple Vitamin (MULTIVITAMIN WITH MINERALS) TABS Take 1 tablet by mouth daily.      Marland Kitchen OVER THE COUNTER MEDICATION Place 1 drop into both eyes daily as needed. OTC Dry Eyes Eyedrop  For dry eyes      . potassium chloride SA (K-DUR,KLOR-CON) 20 MEQ tablet Take 20 mEq by mouth daily as needed. When taking the lasix      . RABEprazole (ACIPHEX) 20 MG tablet Take 1 tablet (20 mg total) by mouth daily.  90 tablet  0  . trandolapril (MAVIK) 2 MG tablet Take 1 tablet (2 mg total) by mouth 2 (two) times daily. Resume this medication  60 tablet  3  . [DISCONTINUED] omeprazole (PRILOSEC) 20 MG capsule Take 1 capsule (20 mg total) by mouth daily.  30 capsule  11   No current facility-administered medications for this visit.     Past Medical History  Diagnosis Date  .  Nonischemic cardiomyopathy     EF initially 20%; Last measurement up to 50% per echo in August of 2012  . CHF (congestive heart failure)   . LBBB (left bundle branch block)   . Mitral regurgitation   . Melanoma     right arm  . Hypothyroidism   . Erosive gastritis 08/2004  . Hyperplastic polyps of stomach 06/2002  . Barrett's esophagus 04/2008  . GERD (gastroesophageal reflux disease)   . Diverticulitis   . Anxiety disorder   . HTN (hypertension)   . ICD (implantable cardiac defibrillator) in place     BiV/ICD; s/p removal of ICD with insertion of BiV PPM 04/26/12  . Hair thinning   . Unspecified sinusitis (chronic)   . Unspecified chronic bronchitis   . Depressive disorder, not elsewhere classified   . Restless legs syndrome (RLS)   . Chronic airway obstruction, not elsewhere classified   . Benign neoplasm of colon   . Other and  unspecified hyperlipidemia   . Unspecified nasal polyp   . Pain in joint, lower leg   . Synovial cyst of popliteal space   . Complication of anesthesia     slow to wake up  . Pacemaker 04/26/2012  . Sleep apnea   . Other and unspecified hyperlipidemia   . Dysthymic disorder   . Palpitations     ROS:   All systems reviewed and negative except as noted in the HPI.   Past Surgical History  Procedure Laterality Date  . Defibrillator insertion      s/p removal of previously implanted BiV ICD and insertion of a new BiV pacemaker on 04/26/12  . Left arm surgery    . Abdominal hysterectomy  1076    endometriosis  . Right breast lumpectomy  1988    Dr. Marylene Buerger  . Appendectomy    . Tonsillectomy    . Right knee surgery      arthroscopy: Dr. Shellia Carwin  . Excision of wen  1984    Dr. Parks Ranger  . Excise mole of lip  2001    Dr. Larena Sox  . Inguinal hernia repair Right 09/26/2012    Procedure: RIGHT INGUINAL HERNIA REPAIR WITH MESH;  Surgeon: Odis Hollingshead, MD;  Location: [facility];  Service: General;  Laterality: Right;  . Insertion of mesh Right 09/26/2012    Procedure: INSERTION OF MESH;  Surgeon: Odis Hollingshead, MD;  Location: [facility];  Service: General;  Laterality: Right;  . Inguinal hernia repair Right 12/25/2012    Procedure: explor right groin, small bowel rescection, tissue repair right femoral hernia;  Surgeon: Adin Hector, MD;  Location: [facility];  Service: General;  Laterality: Right;  . Laparoscopy N/A 01/04/2013    Procedure: Diagnostic Laparoscopy, exploratory laparotomy with small bowel resection, closure of right femoral hernia repair;  Surgeon: Madilyn Hook, DO;  Location: [facility];  Service: General;  Laterality: N/A;  . Femoral hernia repair  12/25/2012    Dr Dalbert Batman  . Femoral hernia repair  01/04/2013    Recurrent - Dr. Lilyan Punt     Family History  Problem Relation Age of Onset  . Heart disease Other     maternal side  . Colon cancer Paternal Uncle   .  Breast cancer Maternal Aunt   . Diabetes Neg Hx   . Stroke Father      History   Social History  . Marital Status: Single    Spouse Name: N/A    Number of Children: 0  .  Years of Education: N/A   Occupational History  . Retired   .     Social History Main Topics  . Smoking status: Former Smoker    Types: Cigarettes    Quit date: 12/22/1993  . Smokeless tobacco: Never Used     Comment: Does'nt reall year quit   . Alcohol Use: No  . Drug Use: No  . Sexual Activity: No   Other Topics Concern  . Not on file   Social History Narrative  . No narrative on file     BP 144/82  Pulse 59  Ht 5' 8.5" (1.74 m)  Wt 135 lb (61.236 kg)  BMI 20.23 kg/m2  Physical Exam:  Well appearing 80 year old woman, NAD HEENT: Unremarkable Neck:  No JVD, no thyromegally Lungs:  Clear with no wheezes, rales, or rhonchi. Well-healed pacemaker incision HEART:  Regular rate rhythm, no murmurs, no rubs, no clicks Abd:  soft, positive bowel sounds, no organomegally, no rebound, no guarding, hernia incisions are healing nicely. Ext:  2 plus pulses, no edema, no cyanosis, no clubbing Skin:  No rashes no nodules Neuro:  CN II through XII intact, motor grossly intact  ECG - normal sinus rhythm with AV sequential biventricular pacing  DEVICE  Normal device function.  See PaceArt for details.   Assess/Plan:

## 2013-04-10 NOTE — Patient Instructions (Signed)
Your physician wants you to follow-up in: 6 months with device clinic and 12 months with Dr Taylor You will receive a reminder letter in the mail two months in advance. If you don't receive a letter, please call our office to schedule the follow-up appointment.  

## 2013-04-23 ENCOUNTER — Other Ambulatory Visit (HOSPITAL_COMMUNITY): Payer: Medicare Other

## 2013-04-25 ENCOUNTER — Telehealth: Payer: Self-pay | Admitting: Cardiology

## 2013-04-25 ENCOUNTER — Telehealth: Payer: Self-pay | Admitting: *Deleted

## 2013-04-25 NOTE — Telephone Encounter (Signed)
Spoke with patient. I advised pt of upcoming appts for echo and Dr [name]. She states Malachy Mood called her Monday, she is not sure why, she has not had recent testing.  She is aware Malachy Mood not here today, she will call later to followup with Malachy Mood.

## 2013-04-25 NOTE — Telephone Encounter (Signed)
Azithromycin 500 mg.( disp: 3 tabs) Take one daily to treat infection

## 2013-04-25 NOTE — Telephone Encounter (Signed)
New message ° ° ° °Returning nurses call °

## 2013-04-25 NOTE — Telephone Encounter (Signed)
Patient called and stated that she has a sinus infection. Having head congestion and drainage and face sore and swollen. Been taking OTC medication but not going away. Wants to know if you would call in an antibiotic. Patient has an appointment for a follow up on 05/08/2013.  Please Advise.   Pharmacy: [facility] # [phone]

## 2013-04-26 ENCOUNTER — Ambulatory Visit (HOSPITAL_COMMUNITY): Payer: Medicare Other | Attending: Internal Medicine | Admitting: Radiology

## 2013-04-26 ENCOUNTER — Other Ambulatory Visit (HOSPITAL_COMMUNITY): Payer: Self-pay | Admitting: Radiology

## 2013-04-26 ENCOUNTER — Other Ambulatory Visit: Payer: Self-pay | Admitting: *Deleted

## 2013-04-26 ENCOUNTER — Encounter: Payer: Self-pay | Admitting: Internal Medicine

## 2013-04-26 DIAGNOSIS — I079 Rheumatic tricuspid valve disease, unspecified: Secondary | ICD-10-CM | POA: Diagnosis not present

## 2013-04-26 DIAGNOSIS — I059 Rheumatic mitral valve disease, unspecified: Secondary | ICD-10-CM

## 2013-04-26 DIAGNOSIS — I509 Heart failure, unspecified: Secondary | ICD-10-CM | POA: Insufficient documentation

## 2013-04-26 DIAGNOSIS — I10 Essential (primary) hypertension: Secondary | ICD-10-CM | POA: Diagnosis not present

## 2013-04-26 MED ORDER — AZITHROMYCIN 500 MG PO TABS: ORAL_TABLET | ORAL | Status: DC

## 2013-04-26 NOTE — Telephone Encounter (Signed)
Patient Notified and faxed Rx into Pharmacy

## 2013-04-26 NOTE — Progress Notes (Signed)
Echocardiogram Performed. 

## 2013-05-01 ENCOUNTER — Ambulatory Visit: Payer: Medicare Other | Admitting: Cardiology

## 2013-05-07 MED ORDER — FUROSEMIDE 20 MG PO TABS: 20.0000 mg | ORAL_TABLET | Freq: Every day | ORAL | Status: DC | PRN

## 2013-05-07 MED ORDER — POTASSIUM CHLORIDE CRYS ER 20 MEQ PO TBCR: 20.0000 meq | EXTENDED_RELEASE_TABLET | Freq: Every day | ORAL | Status: DC | PRN

## 2013-05-07 MED ORDER — TRANDOLAPRIL 2 MG PO TABS: 2.0000 mg | ORAL_TABLET | Freq: Two times a day (BID) | ORAL | Status: DC

## 2013-05-07 NOTE — Telephone Encounter (Signed)
Follow up    Patient calling has some questions regarding upcoming test.

## 2013-05-07 NOTE — Telephone Encounter (Signed)
Returned call to patient she stated she would like 90 day refills on kdur,lasix, and Mavik.Also wanted a sooner appointment with Dr.Jordan.Appointment rescheduled with Dr.Jordan 06/19/13 at 2:30 pm.

## 2013-05-08 ENCOUNTER — Ambulatory Visit (INDEPENDENT_AMBULATORY_CARE_PROVIDER_SITE_OTHER): Payer: Medicare Other | Admitting: Internal Medicine

## 2013-05-08 ENCOUNTER — Encounter: Payer: Self-pay | Admitting: Internal Medicine

## 2013-05-08 VITALS — BP 122/78 | HR 64 | Temp 96.6°F | Resp 10 | Wt 135.0 lb

## 2013-05-08 DIAGNOSIS — Z79899 Other long term (current) drug therapy: Secondary | ICD-10-CM

## 2013-05-08 DIAGNOSIS — K2270 Barrett's esophagus without dysplasia: Secondary | ICD-10-CM

## 2013-05-08 DIAGNOSIS — F411 Generalized anxiety disorder: Secondary | ICD-10-CM | POA: Diagnosis not present

## 2013-05-08 DIAGNOSIS — K2970 Gastritis, unspecified, without bleeding: Secondary | ICD-10-CM

## 2013-05-08 DIAGNOSIS — I7300 Raynaud's syndrome without gangrene: Secondary | ICD-10-CM

## 2013-05-08 DIAGNOSIS — J42 Unspecified chronic bronchitis: Secondary | ICD-10-CM

## 2013-05-08 DIAGNOSIS — F329 Major depressive disorder, single episode, unspecified: Secondary | ICD-10-CM | POA: Diagnosis not present

## 2013-05-08 DIAGNOSIS — F3289 Other specified depressive episodes: Secondary | ICD-10-CM | POA: Diagnosis not present

## 2013-05-08 DIAGNOSIS — K219 Gastro-esophageal reflux disease without esophagitis: Secondary | ICD-10-CM

## 2013-05-08 DIAGNOSIS — I428 Other cardiomyopathies: Secondary | ICD-10-CM

## 2013-05-08 DIAGNOSIS — E039 Hypothyroidism, unspecified: Secondary | ICD-10-CM

## 2013-05-08 DIAGNOSIS — K2990 Gastroduodenitis, unspecified, without bleeding: Secondary | ICD-10-CM

## 2013-05-08 DIAGNOSIS — F419 Anxiety disorder, unspecified: Secondary | ICD-10-CM

## 2013-05-08 DIAGNOSIS — R5383 Other fatigue: Secondary | ICD-10-CM

## 2013-05-08 DIAGNOSIS — I5022 Chronic systolic (congestive) heart failure: Secondary | ICD-10-CM | POA: Diagnosis not present

## 2013-05-08 DIAGNOSIS — J329 Chronic sinusitis, unspecified: Secondary | ICD-10-CM

## 2013-05-08 DIAGNOSIS — R5381 Other malaise: Secondary | ICD-10-CM | POA: Insufficient documentation

## 2013-05-08 MED ORDER — LEVOTHYROXINE SODIUM 112 MCG PO TABS: ORAL_TABLET | ORAL | Status: DC

## 2013-05-08 MED ORDER — RABEPRAZOLE SODIUM 20 MG PO TBEC: DELAYED_RELEASE_TABLET | ORAL | Status: DC

## 2013-05-08 MED ORDER — AMOXICILLIN 875 MG PO TABS: ORAL_TABLET | ORAL | Status: DC

## 2013-05-08 NOTE — Patient Instructions (Signed)
Continue medications as listed 

## 2013-05-08 NOTE — Progress Notes (Signed)
Patient ID: [name], female   DOB: 04-14-1932, 80 y.o.   MRN: [id number]    Location:    [facility]  Place of Service:  OFFICE    Allergies  Allergen Reactions  . Hydrocodone Itching  . Cephalexin Itching and Swelling  . Doxycycline Other (See Comments)    Per pt: unknown  . Ofloxacin Other (See Comments)    Per pt: unknown  . Tape Other (See Comments)    Burns skin.   . Latex Rash    Chief Complaint  Patient presents with  . Medical Managment of Chronic Issues    2 month follow-up, discuss medications   . Sinus Problem    Patient with onging sinus infection x 3 weeks     HPI:  Episodes of morning weakness.   GERD: improved.   GASTRITIS: resolved  Barrett's esophagus - continues to benefit from: RABEprazole (ACIPHEX) 20 MG tablet  Hypothyroidism - compensated with levothyroxine (SYNTHROID, LEVOTHROID) 112 MCG tablet, TSH  Other primary cardiomyopathies: improved LVEF  Unspecified chronic bronchitis: coughs, but is not producing much sputum. No fever.  Unspecified sinusitis (chronic) - no fever, but has pressure around eyes and in nose. Blowing nose frequently.  Anxiety disorder: peersistent  Other malaise and fatigue;tired all the time.  Raynaud disease; hands turn red and are uncomfortable sometimes  Encounter for long-term (current) use of other medications -  Digoxin     Medications: Patient's Medications  New Prescriptions   No medications on file  Previous Medications   ACETAMINOPHEN (TYLENOL) 500 MG TABLET    Take 500 mg by mouth every 6 (six) hours as needed for pain. For pain   ASCORBIC ACID (VITAMIN C) 1000 MG TABLET    Take 1,000 mg by mouth daily.   BIOTIN 1000 MCG TABLET    Take 5,000 mcg by mouth daily.    CALCIUM CARBONATE-VITAMIN D (CALCIUM + D PO)    Take 1 tablet by mouth daily. 1200mg  of Calcium and 1000mg  Vit D   CYANOCOBALAMIN (VITAMIN B 12 PO)    Take 1 tablet by mouth daily.    DIGOXIN (LANOXIN) 0.125 MG TABLET    Take 1 tablet  (125 mcg total) by mouth daily.   ESTRACE VAGINAL 0.1 MG/GM VAGINAL CREAM    Place 1 g vaginally 2 (two) times a week.    ESTROGEN-METHYLTESTOSTERONE (COVARYX HS) 0.625-1.25 MG PER TABLET    Take 1 tablet by mouth daily.   FUROSEMIDE (LASIX) 20 MG TABLET    Take 1 tablet (20 mg total) by mouth daily as needed. For fluid retention   LEVOTHYROXINE (SYNTHROID, LEVOTHROID) 112 MCG TABLET    Take 112 mcg by mouth daily.     MAGNESIUM 250 MG TABS    Take 1 tablet by mouth daily.    METOPROLOL TARTRATE (LOPRESSOR) 25 MG TABLET    Take 12.5 mg by mouth daily.    MULTIPLE VITAMIN (MULTIVITAMIN WITH MINERALS) TABS    Take 1 tablet by mouth daily.   OVER THE COUNTER MEDICATION    Place 1 drop into both eyes daily as needed. OTC Dry Eyes Eyedrop  For dry eyes   POTASSIUM CHLORIDE SA (K-DUR,KLOR-CON) 20 MEQ TABLET    Take 1 tablet (20 mEq total) by mouth daily as needed. When taking the lasix   RABEPRAZOLE (ACIPHEX) 20 MG TABLET    Take 1 tablet (20 mg total) by mouth daily.   TRANDOLAPRIL (MAVIK) 2 MG TABLET    Take 1 tablet (2  mg total) by mouth 2 (two) times daily. Resume this medication  Modified Medications   No medications on file  Discontinued Medications   AZITHROMYCIN (ZITHROMAX) 500 MG TABLET    Take one tablet by mouth once daily for infection     Review of Systems  Constitutional: Positive for appetite change, fatigue and unexpected weight change. Negative for fever and chills.  HENT: Positive for congestion, hearing loss, postnasal drip, rhinorrhea and sinus pressure. Negative for ear discharge, ear pain, facial swelling, nosebleeds, sneezing and tinnitus.   Eyes: Negative.   Respiratory: Positive for cough and shortness of breath. Negative for wheezing.   Cardiovascular: Positive for palpitations. Negative for chest pain and leg swelling.  Gastrointestinal: Negative for nausea, abdominal pain, diarrhea, blood in stool and abdominal distention.       Hx erosive gastritis, HH, Barrett's  esophagus. Persistent, uncomfortable lump in her right groin.  Endocrine: Negative.   Genitourinary:       Nocturia x 3. Vaginal discomfort. Uses hormone cream from GYN, Dr. Nori Riis  Musculoskeletal: Positive for arthralgias, back pain, neck pain and neck stiffness. Negative for gait problem, joint swelling and myalgias.  Skin: Negative for pallor, rash and wound.       S/P right herniorraphy. Right index finger has onycholysis distally and tenderness.  Neurological: Positive for dizziness, numbness and headaches. Negative for tremors, seizures, syncope and speech difficulty.  Hematological: Negative.   Psychiatric/Behavioral: Positive for dysphoric mood. The patient is nervous/anxious.     Filed Vitals:   05/08/13 1450  BP: 122/78  Pulse: 64  Temp: 96.6 F (35.9 C)  TempSrc: Oral  Resp: 10  Weight: 135 lb (61.236 kg)  SpO2: 99%   Physical Exam  Constitutional:  Frail, thin body. Losing weight.  HENT:  Right Ear: External ear normal.  Left Ear: External ear normal.  Mouth/Throat: Oropharynx is clear and moist.  Nasal polyp  Eyes:  Corrective lensees  Neck: No JVD present. No tracheal deviation present. No thyromegaly present.  stiff  Cardiovascular: Normal rate and regular rhythm.  Exam reveals no gallop and no friction rub.   No murmur heard. AICD in left upper chest. Raynaud's disease.  Pulmonary/Chest: Effort normal. No respiratory distress. She has no wheezes. She has rales. She exhibits no tenderness.  Abdominal: Soft. Bowel sounds are normal. She exhibits no distension and no mass. There is no tenderness.  Right inguinal area has scar from surgery.  Small lymph node in left groin.  Musculoskeletal: Normal range of motion. She exhibits no edema.  Tender right knee.   Lymphadenopathy:    She has no cervical adenopathy.  Neurological:  Reduced vibratory sensation in both feet.  Skin: No rash noted. No erythema. No pallor.  Onycholysis of right index fingeer.  Tender to palpation.  Psychiatric:  Anxious. Depressed affect. Low energy.     Labs reviewed: Office Visit on 04/10/2013  Component Date Value Ref Range Status  . Date Time Interrogation Session 04/10/2013 [id number]   Final  . Pulse Generator Manufacturer 04/10/2013 [employer]   Final  . Pulse Gen Model 04/10/2013 V173   Final  . Pulse Gen Serial Number 04/10/2013 [id number]   Final  . RV Sense Sensitivity 04/10/2013 2.5   Final  . RV Adaptation Mode 04/10/2013 Fixed Pacing   Final  . LV Sense Sensitivity 04/10/2013 2.5   Final  . LV Adaptation Mode 04/10/2013 Fixed Pacing   Final  . RA Pace Amplitude 04/10/2013 2.0   Final  . RV  Pace PulseWidth 04/10/2013 0.8   Final  . RV Pace Amplitude 04/10/2013 3.2   Final  . LV Pace PulseWidth 04/10/2013 1.0   Final  . LV Pace Amplitude 04/10/2013 2.2   Final  . LV Pace Capture Mode 04/10/2013 Fixed Pacing   Final  . Zone Setting Type Category 04/10/2013 VT   Final  . Zone Detect Interval 04/10/2013 375.0   Final  . LV Impedance 04/10/2013 632.0   Final  . LV Amplitude 04/10/2013 7.2   Final  . LV Pacing Amplitude 04/10/2013 1.2000   Final  . LV PACING PULSEWIDTH 04/10/2013 1.0   Final  . RA Impedance 04/10/2013 519.0   Final  . RA Amplitude 04/10/2013 2.0   Final  . RA Pacing Amplitude 04/10/2013 0.7000   Final  . RA Pacing PulseWidth 04/10/2013 0.4   Final  . RV IMPEDANCE 04/10/2013 446.0   Final  . RV Amplitude 04/10/2013 14.1   Final  . RV Pacing Amplitude 04/10/2013 1.6000   Final  . RV Pacing PulseWidth 04/10/2013 0.8   Final  . Battery Status 04/10/2013 BOS   Final  . Loletha Grayer RA Perc Paced 04/10/2013 63   Final  . Loletha Grayer RV Perc Paced 04/10/2013 98   Final  . Eval Rhythm 04/10/2013 SB @ 40   Final  . Miscellaneous Comment 04/10/2013    Final                   Value:CRT-P device check in clinic. Normal device function. Thresholds, sensing, impedance consistent with previous measurements. Histograms appropriate for patient  and level of activity. 1 mode switch--- <43min. 8 NSVT, 2 w/ EGM, both SVT. Patient                          bi-ventricularly pacing 98% of the time. Device programmed with appropriate safety margins---changed LV output from 2.0 to 2.2V. Estimated longevity 81yrs. ROV w/ device clinic in 62mo & w/ GT in 58mo.   Had 2D Echo in 04/22/13 and 03/22/13. LVEF is improved to 60%   Assessment/Plan  1. GERD improved  2. GASTRITIS improved  3. Barrett's esophagus - RABEprazole (ACIPHEX) 20 MG tablet; One daily to reduce stomach acid.  Dispense: 90 tablet; Refill: 3  4. Hypothyroidism - levothyroxine (SYNTHROID, LEVOTHROID) 112 MCG tablet; One daily for thyroid supplement  Dispense: 90 tablet; Refill: 3 - TSH; Future  5. Other primary cardiomyopathies improved  6. Unspecified chronic bronchitis stable  7. Unspecified sinusitis (chronic) Worse. Could not use azithromycin - amoxicillin (AMOXIL) 875 MG tablet; One twice daily for infection  Dispense: 20 tablet; Refill: 0  8. Anxiety disorder unchanged  9. Other malaise and fatigue Likely related to depression and anxiety - Comprehensive metabolic panel; Future  10. Raynaud disease Chronic. I do not want to add additional meds at this time/  11. Encounter for long-term (current) use of other medications - Digoxin level; Future

## 2013-05-15 DIAGNOSIS — Z1212 Encounter for screening for malignant neoplasm of rectum: Secondary | ICD-10-CM | POA: Diagnosis not present

## 2013-05-15 DIAGNOSIS — Z01419 Encounter for gynecological examination (general) (routine) without abnormal findings: Secondary | ICD-10-CM | POA: Diagnosis not present

## 2013-05-15 DIAGNOSIS — N760 Acute vaginitis: Secondary | ICD-10-CM | POA: Diagnosis not present

## 2013-06-12 ENCOUNTER — Encounter: Payer: Self-pay | Admitting: Internal Medicine

## 2013-06-19 ENCOUNTER — Encounter: Payer: Self-pay | Admitting: Cardiology

## 2013-06-19 ENCOUNTER — Ambulatory Visit (INDEPENDENT_AMBULATORY_CARE_PROVIDER_SITE_OTHER): Payer: Medicare Other | Admitting: Cardiology

## 2013-06-19 VITALS — BP 140/62 | HR 55 | Ht 68.0 in | Wt 135.0 lb

## 2013-06-19 DIAGNOSIS — I493 Ventricular premature depolarization: Secondary | ICD-10-CM

## 2013-06-19 DIAGNOSIS — Z95810 Presence of automatic (implantable) cardiac defibrillator: Secondary | ICD-10-CM | POA: Diagnosis not present

## 2013-06-19 DIAGNOSIS — I5022 Chronic systolic (congestive) heart failure: Secondary | ICD-10-CM

## 2013-06-19 DIAGNOSIS — I4949 Other premature depolarization: Secondary | ICD-10-CM | POA: Diagnosis not present

## 2013-06-19 NOTE — Patient Instructions (Addendum)
Continue your current therapy except you can stop Digoxin.  I will see you in 6 months.

## 2013-06-19 NOTE — Progress Notes (Signed)
[name] Date of Birth: 1932/05/01 Medical Record #[id number]  History of Present Illness: [name] is seen today for follow up. She has multiple medical issues which include a cardiomyopathy, underlying BiV/ICD which resulted in improvement of her EF, LBBB, HTN, thyroid disease, melanoma, anxiety/depression and chronic systolic HF. EF is 45 to 50%.   She had a difficult time with hernia surgery last year. The first surgery was unsuccessful and she required 2 additional surgeries to correct. Following one of the surgeries she had a wide complex arrhythmia that was felt by Dr. Rayann Heman to be her intrinsic rhythm (over her Biv paced rhythm). This was felt to be related to increased adrenergic tone. She was managed with beta blocker and digoxin.   On follow up today she is doing well from a cardiac standpoint. No chest pain or SOB. No palpitations. She did have some edema after her 3rd surgery but this has resolved.   Current Outpatient Prescriptions  Medication Sig Dispense Refill  . acetaminophen (TYLENOL) 500 MG tablet Take 500 mg by mouth every 6 (six) hours as needed for pain. For pain      . Ascorbic Acid (VITAMIN C) 1000 MG tablet Take 1,000 mg by mouth daily.      . Biotin 1000 MCG tablet Take 5,000 mcg by mouth daily.       . Calcium Carbonate-Vitamin D (CALCIUM + D PO) Take 1 tablet by mouth daily. 1200mg  of Calcium and 1000mg  Vit D      . Cyanocobalamin (VITAMIN B 12 PO) Take 1 tablet by mouth daily.       Marland Kitchen ESTRACE VAGINAL 0.1 MG/GM vaginal cream Place 1 g vaginally 2 (two) times a week.       . estrogen-methylTESTOSTERone (COVARYX HS) 0.625-1.25 MG per tablet Take 1 tablet by mouth daily.  30 tablet  5  . furosemide (LASIX) 20 MG tablet Take 1 tablet (20 mg total) by mouth daily as needed. For fluid retention  90 tablet  3  . levothyroxine (SYNTHROID, LEVOTHROID) 112 MCG tablet One daily for thyroid supplement  90 tablet  3  . Magnesium 250 MG TABS Take 1 tablet by mouth  daily.       . metoprolol tartrate (LOPRESSOR) 25 MG tablet Take 12.5 mg by mouth daily.       . Multiple Vitamin (MULTIVITAMIN WITH MINERALS) TABS Take 1 tablet by mouth daily.      Marland Kitchen OVER THE COUNTER MEDICATION Place 1 drop into both eyes daily as needed. OTC Dry Eyes Eyedrop  For dry eyes      . potassium chloride SA (K-DUR,KLOR-CON) 20 MEQ tablet Take 1 tablet (20 mEq total) by mouth daily as needed. When taking the lasix  90 tablet  3  . RABEprazole (ACIPHEX) 20 MG tablet One daily to reduce stomach acid.  90 tablet  3  . trandolapril (MAVIK) 2 MG tablet Take 1 tablet (2 mg total) by mouth 2 (two) times daily. Resume this medication  180 tablet  6  . [DISCONTINUED] omeprazole (PRILOSEC) 20 MG capsule Take 1 capsule (20 mg total) by mouth daily.  30 capsule  11   No current facility-administered medications for this visit.    Allergies  Allergen Reactions  . Hydrocodone Itching  . Cephalexin Itching and Swelling  . Doxycycline Other (See Comments)    Per pt: unknown  . Ofloxacin Other (See Comments)    Per pt: unknown  . Tape Other (See Comments)  Burns skin.   . Latex Rash    Past Medical History  Diagnosis Date  . Nonischemic cardiomyopathy     EF initially 20%; Last measurement up to 50% per echo in August of 2012  . CHF (congestive heart failure)   . LBBB (left bundle branch block)   . Mitral regurgitation   . Melanoma     right arm  . Hypothyroidism   . Erosive gastritis 08/2004  . Hyperplastic polyps of stomach 06/2002  . Barrett's esophagus 04/2008  . GERD (gastroesophageal reflux disease)   . Diverticulitis   . Anxiety disorder   . HTN (hypertension)   . ICD (implantable cardiac defibrillator) in place     BiV/ICD; s/p removal of ICD with insertion of BiV PPM 04/26/12  . Hair thinning   . Unspecified sinusitis (chronic)   . Unspecified chronic bronchitis   . Depressive disorder, not elsewhere classified   . Restless legs syndrome (RLS)   . Chronic airway  obstruction, not elsewhere classified   . Benign neoplasm of colon   . Other and unspecified hyperlipidemia   . Unspecified nasal polyp   . Pain in joint, lower leg   . Synovial cyst of popliteal space   . Complication of anesthesia     slow to wake up  . Pacemaker 04/26/2012  . Sleep apnea   . Other and unspecified hyperlipidemia   . Dysthymic disorder   . Palpitations     Past Surgical History  Procedure Laterality Date  . Defibrillator insertion      s/p removal of previously implanted BiV ICD and insertion of a new BiV pacemaker on 04/26/12  . Left arm surgery    . Abdominal hysterectomy  1076    endometriosis  . Right breast lumpectomy  1988    Dr. Marylene Buerger  . Appendectomy    . Tonsillectomy    . Right knee surgery      arthroscopy: Dr. Shellia Carwin  . Excision of wen  1984    Dr. Parks Ranger  . Excise mole of lip  2001    Dr. Larena Sox  . Inguinal hernia repair Right 09/26/2012    Procedure: RIGHT INGUINAL HERNIA REPAIR WITH MESH;  Surgeon: Odis Hollingshead, MD;  Location: [facility];  Service: General;  Laterality: Right;  . Insertion of mesh Right 09/26/2012    Procedure: INSERTION OF MESH;  Surgeon: Odis Hollingshead, MD;  Location: [facility];  Service: General;  Laterality: Right;  . Inguinal hernia repair Right 12/25/2012    Procedure: explor right groin, small bowel rescection, tissue repair right femoral hernia;  Surgeon: Adin Hector, MD;  Location: [facility];  Service: General;  Laterality: Right;  . Laparoscopy N/A 01/04/2013    Procedure: Diagnostic Laparoscopy, exploratory laparotomy with small bowel resection, closure of right femoral hernia repair;  Surgeon: Madilyn Hook, DO;  Location: [facility];  Service: General;  Laterality: N/A;  . Femoral hernia repair  12/25/2012    Dr Dalbert Batman  . Femoral hernia repair  01/04/2013    Recurrent - Dr. Lilyan Punt    History  Smoking status  . Former Smoker  . Types: Cigarettes  . Quit date: 12/22/1993  Smokeless tobacco  . Never  Used    Comment: Does'nt reall year quit     History  Alcohol Use No    Family History  Problem Relation Age of Onset  . Heart disease Other     maternal side  . Colon cancer Paternal Uncle   .  Breast cancer Maternal Aunt   . Diabetes Neg Hx   . Stroke Father     Review of Systems: The review of systems is per the HPI.  All other systems were reviewed and are negative.  Physical Exam: BP 140/62  Pulse 55  Ht 5\' 8"  (1.727 m)  Wt 135 lb (61.236 kg)  BMI 20.53 kg/m2 Patient is very pleasant and in no acute distress.Skin is warm and dry. Color is normal.  HEENT is unremarkable. Normocephalic/atraumatic. PERRL. Sclera are nonicteric. Neck is supple. No masses. No JVD. Lungs are clear. Cardiac exam shows a regular rate and rhythm. Abdomen is soft. Extremities are without edema. Gait and ROM are intact. No gross neurologic deficits noted.  Wt Readings from Last 3 Encounters:  06/19/13 135 lb (61.236 kg)  05/08/13 135 lb (61.236 kg)  04/10/13 135 lb (61.236 kg)     LABORATORY DATA: Lab Results  Component Value Date   WBC 8.3 01/08/2013   HGB 10.4* 01/08/2013   HCT 29.4* 01/08/2013   PLT 343 01/08/2013   GLUCOSE 106* 01/08/2013   TRIG 52 01/08/2013   ALT 83* 01/08/2013   AST 82* 01/08/2013   NA 135 01/08/2013   K 3.6 01/08/2013   CL 102 01/08/2013   CREATININE 0.49* 01/08/2013   BUN 16 01/08/2013   CO2 27 01/08/2013   TSH 1.280 12/07/2012   INR 1.08 09/19/2012   Echo Study Conclusions from February 2015  Study Conclusions  - Left ventricle: Systolic function was normal. The estimated ejection fraction was in the range of 55% to 60%. Wall motion was normal; there were no regional wall motion abnormalities. The study is not technically sufficient to allow evaluation of LV diastolic function. The E/e' ratio is >10, suggesting elevated LV filling pressure. - Mitral valve: Mildly thickened leaflets . Not well visualized. Mildto moderate regurgitation. - Left atrium: Severely  dilated (43 ml/m2). - Tricuspid valve: Mild regurgitation. - Pulmonary arteries: PA peak pressure: 78mm Hg (S). - Systemic veins: The IVC measures >2.1 cm, but collapses >50%, suggesting an elevated RA pressure of 8 mmHg. - Pericardium, extracardiac: There was no pericardial effusion.  Recent ICD check showed biv pacing 98% of the time. 8 beat run of NSVT.    Assessment / Plan: 1.  Cardiomyopathy with EF now normal - has BiV/ICD in place -  I think overall she is doing great. Will stop digoxin now. Continue low dose metoprolol and ACEi. Follow up in 6 months.  2. NSVT. No role for dig at this point. Will stop.

## 2013-07-06 ENCOUNTER — Ambulatory Visit: Payer: Medicare Other | Admitting: Cardiology

## 2013-07-23 ENCOUNTER — Telehealth: Payer: Self-pay | Admitting: Gastroenterology

## 2013-07-23 DIAGNOSIS — Z1231 Encounter for screening mammogram for malignant neoplasm of breast: Secondary | ICD-10-CM | POA: Diagnosis not present

## 2013-07-23 NOTE — Telephone Encounter (Signed)
OK with me.

## 2013-07-23 NOTE — Telephone Encounter (Signed)
Dr. Jacobs will you accept?  

## 2013-07-23 NOTE — Telephone Encounter (Signed)
Patient reports intermittent abdominal pain and "hard places in my abdomen".  She has had several surgeries in the last few months for sm bowel obstruction.  She denies fever, diarrhea, constipation, nausea or vomiting. She does c/o HA and some dizziness.  She wants to see Dr. Ardis Hughs.  She is advised that will need to speak with both Dr. Fuller Plan and Dr. Ardis Hughs about changing MDs.  She will come in and see Alonza Bogus, PA tomorrow at 2:00.  Dr. Fuller Plan ok for change to Dr. Ardis Hughs?

## 2013-07-24 ENCOUNTER — Ambulatory Visit (INDEPENDENT_AMBULATORY_CARE_PROVIDER_SITE_OTHER): Payer: Medicare Other | Admitting: Gastroenterology

## 2013-07-24 ENCOUNTER — Encounter: Payer: Self-pay | Admitting: Gastroenterology

## 2013-07-24 ENCOUNTER — Ambulatory Visit (INDEPENDENT_AMBULATORY_CARE_PROVIDER_SITE_OTHER)
Admission: RE | Admit: 2013-07-24 | Discharge: 2013-07-24 | Disposition: A | Discharge status: Home / Self Care | Payer: Medicare Other | Source: Ambulatory Visit | Attending: Gastroenterology | Admitting: Gastroenterology

## 2013-07-24 VITALS — BP 126/60 | HR 60 | Ht 67.75 in | Wt 136.2 lb

## 2013-07-24 DIAGNOSIS — R6889 Other general symptoms and signs: Secondary | ICD-10-CM

## 2013-07-24 DIAGNOSIS — R140 Abdominal distension (gaseous): Secondary | ICD-10-CM

## 2013-07-24 DIAGNOSIS — R109 Unspecified abdominal pain: Secondary | ICD-10-CM

## 2013-07-24 DIAGNOSIS — K2270 Barrett's esophagus without dysplasia: Secondary | ICD-10-CM | POA: Diagnosis not present

## 2013-07-24 NOTE — Telephone Encounter (Signed)
That is ok with me  

## 2013-07-24 NOTE — Telephone Encounter (Signed)
Patient notified of the approval. She is asked to keep appt today at 2:00 with Alonza Bogus, PA

## 2013-07-24 NOTE — Patient Instructions (Addendum)
You have a follow up appointment with Dr. Ardis Hughs on 09-19-2013 at 215 pm. Please call if symptoms do not get better or reoccur.  Please increase Aciphex to twice daily.  Please go the basement and have an abdominal x-ray before leaving today, we will call you with the results.

## 2013-07-26 NOTE — Progress Notes (Signed)
07/26/2013 [name] [id number] 1932/07/07   History of Present Illness:  This is a pleasant 80 year old female who was previously a patient of Dr. Lynne Leader but has requested to switch physicians and will not be following with Dr. Ardis Hughs instead.  She has a history of Barrett's esophagus and her last EGD was in 07/2011 at which time she was found to have a small hiatal hernia, mild gastritis, and Barrett's esophagus. Her esophageal biopsies showed mild inflammation but no Barrett's present at that time. Gastric biopsies showed mild chronic gastritis, negative for H. Pylori.  She is currently on AcipHex 20 mg daily for her upper GI issues.  Her most recent GI history consists of a small bowel obstruction in October 2014 due to a femoral hernia, which required surgical repair in the form of a diagnostic laparoscopy with exploratory laparotomy, small bowel resection, and closure of right femoral hernia.  She has not been seen in our office since that was discovered. She comes in today, however, with some complaints of epigastric abdominal pain that began on May 10. She states it feels like there is a hard band across her epigastrium. She was going to see her PCP but could not get an appt there until June 3. She states that overall she is just concerned as she does not want to have any other recurrent issues with bowel instructions, etc.  Denies nausea or vomiting.  Has a lot of gas and bloating and she says that her stomach makes a lot of noises.  She is moving her bowels without difficulty.   Current Medications, Allergies, Past Medical History, Past Surgical History, Family History and Social History were reviewed in [employer] record.   Physical Exam: BP 126/60  Pulse 60  Ht 5' 7.75" (1.721 m)  Wt 136 lb 4 oz (61.803 kg)  BMI 20.87 kg/m2 General: Well developed white female in no acute distress Head: Normocephalic and atraumatic Eyes:  Sclerae anicteric, conjunctiva pink   Ears: Normal auditory acuity Lungs: Clear throughout to auscultation Heart: Regular rate and rhythm Abdomen: Soft, non-distended.  Normal bowel sounds.  Minimal diffuse TTP without R/R/G; abdomen benign. Musculoskeletal: Symmetrical with no gross deformities  Extremities: No edema  Neurological: Alert oriented x 4, grossly non-focal Psychological:  Alert and cooperative. Normal mood and affect  Assessment and Recommendations: -Upper abdominal pain with gas and bloating -GERD with history of Barrett's esophagus  *Will check 2-view abdominal x-rays today for reassurance to rule out ileus/SBO. *Will increase her aciphex to 20 mg BID for now as well in case her upper abdominal pain is reflux related. *Will follow-up with Dr. Ardis Hughs in 6-8 weeks at which time repeat EGD could be discussed if needed.

## 2013-07-27 ENCOUNTER — Encounter: Payer: Self-pay | Admitting: Gastroenterology

## 2013-07-27 DIAGNOSIS — R140 Abdominal distension (gaseous): Secondary | ICD-10-CM | POA: Insufficient documentation

## 2013-07-27 DIAGNOSIS — R109 Unspecified abdominal pain: Secondary | ICD-10-CM | POA: Insufficient documentation

## 2013-07-31 NOTE — Progress Notes (Signed)
i agree with the above note

## 2013-08-06 ENCOUNTER — Other Ambulatory Visit: Payer: Self-pay | Admitting: *Deleted

## 2013-08-06 ENCOUNTER — Other Ambulatory Visit: Payer: Medicare Other

## 2013-08-06 DIAGNOSIS — R3915 Urgency of urination: Secondary | ICD-10-CM

## 2013-08-06 DIAGNOSIS — Z79899 Other long term (current) drug therapy: Secondary | ICD-10-CM | POA: Diagnosis not present

## 2013-08-06 DIAGNOSIS — R5381 Other malaise: Secondary | ICD-10-CM | POA: Diagnosis not present

## 2013-08-06 DIAGNOSIS — R5383 Other fatigue: Secondary | ICD-10-CM

## 2013-08-06 DIAGNOSIS — E039 Hypothyroidism, unspecified: Secondary | ICD-10-CM

## 2013-08-06 DIAGNOSIS — K219 Gastro-esophageal reflux disease without esophagitis: Secondary | ICD-10-CM

## 2013-08-07 LAB — URINE CULTURE

## 2013-08-08 ENCOUNTER — Encounter: Payer: Self-pay | Admitting: Internal Medicine

## 2013-08-08 ENCOUNTER — Ambulatory Visit (INDEPENDENT_AMBULATORY_CARE_PROVIDER_SITE_OTHER): Payer: Medicare Other | Admitting: Internal Medicine

## 2013-08-08 VITALS — BP 128/80 | HR 63 | Temp 97.1°F | Resp 10 | Wt 135.0 lb

## 2013-08-08 DIAGNOSIS — E039 Hypothyroidism, unspecified: Secondary | ICD-10-CM | POA: Diagnosis not present

## 2013-08-08 DIAGNOSIS — F411 Generalized anxiety disorder: Secondary | ICD-10-CM | POA: Diagnosis not present

## 2013-08-08 DIAGNOSIS — F419 Anxiety disorder, unspecified: Secondary | ICD-10-CM

## 2013-08-08 DIAGNOSIS — I5022 Chronic systolic (congestive) heart failure: Secondary | ICD-10-CM | POA: Diagnosis not present

## 2013-08-08 DIAGNOSIS — F3289 Other specified depressive episodes: Secondary | ICD-10-CM

## 2013-08-08 DIAGNOSIS — K219 Gastro-esophageal reflux disease without esophagitis: Secondary | ICD-10-CM

## 2013-08-08 DIAGNOSIS — R109 Unspecified abdominal pain: Secondary | ICD-10-CM

## 2013-08-08 DIAGNOSIS — R5383 Other fatigue: Secondary | ICD-10-CM

## 2013-08-08 DIAGNOSIS — R5381 Other malaise: Secondary | ICD-10-CM

## 2013-08-08 DIAGNOSIS — L659 Nonscarring hair loss, unspecified: Secondary | ICD-10-CM

## 2013-08-08 DIAGNOSIS — F329 Major depressive disorder, single episode, unspecified: Secondary | ICD-10-CM

## 2013-08-08 LAB — DIGOXIN LEVEL

## 2013-08-08 LAB — COMPREHENSIVE METABOLIC PANEL
ALK PHOS: 82 IU/L (ref 39–117)
ALT: 16 IU/L (ref 0–32)
AST: 23 IU/L (ref 0–40)
Albumin/Globulin Ratio: 2.1 (ref 1.1–2.5)
Albumin: 4.4 g/dL (ref 3.5–4.8)
BUN / CREAT RATIO: 14 (ref 11–26)
BUN: 12 mg/dL (ref 8–27)
CO2: 24 mmol/L (ref 18–29)
Calcium: 9.4 mg/dL (ref 8.7–10.3)
Chloride: 101 mmol/L (ref 97–108)
Creatinine, Ser: 0.85 mg/dL (ref 0.57–1.00)
GFR calc Af Amer: 76 mL/min/{1.73_m2} (ref >59)
GFR calc non Af Amer: 66 mL/min/{1.73_m2} (ref >59)
Globulin, Total: 2.1 g/dL (ref 1.5–4.5)
Glucose: 78 mg/dL (ref 65–99)
POTASSIUM: 4.1 mmol/L (ref 3.5–5.2)
SODIUM: 139 mmol/L (ref 134–144)
Total Bilirubin: 0.5 mg/dL (ref 0.0–1.2)
Total Protein: 6.5 g/dL (ref 6.0–8.5)

## 2013-08-08 LAB — URINALYSIS

## 2013-08-08 LAB — TSH: TSH: 3.29 u[IU]/mL (ref 0.450–4.500)

## 2013-08-08 LAB — SPECIMEN STATUS REPORT

## 2013-08-08 NOTE — Progress Notes (Signed)
Patient ID: [name], female   DOB: 1933/02/20, 80 y.o.   MRN: [id number]    Location:    [facility]  Place of Service:  OFFICE    Allergies  Allergen Reactions  . Hydrocodone Itching  . Cephalexin Itching and Swelling  . Doxycycline Other (See Comments)    Per pt: unknown  . Ofloxacin Other (See Comments)    Per pt: unknown  . Tape Other (See Comments)    Burns skin.   . Latex Rash    Chief Complaint  Patient presents with  . Medical Management of Chronic Issues    3 month follow-up, discuss labs (copy printed)     HPI:  GYN has recommended to her that she taper off estrogens. She is now on every other day. No hot flashes. "May be a little more irritable."  Hypothyroidism: compensated  Abdominal pain, unspecified site: sone upper abd pain, but she is still complaining of lower abd pains that she believes are related to prior surgery. Weight stable. Mild nausea at times.  Anxiety disorder: chronic and unchanged  Chronic systolic heart failure; compensated  GERD: some heartburn  Hair thinning: starting to grow back. Most likely etiology is the stresses of several hospitalizations last year and the surgeries.  Other malaise and fatigue: chronic and most likely related to depression  Depressive disorder, not elsewhere classified: She is open to the idea of taking Pristiq. Her hairdresser told her it is a good drug.    Medications: Patient's Medications  New Prescriptions   No medications on file  Previous Medications   ACETAMINOPHEN (TYLENOL) 500 MG TABLET    Take 500 mg by mouth every 6 (six) hours as needed for pain. For pain   ASCORBIC ACID (VITAMIN C) 1000 MG TABLET    Take 1,000 mg by mouth daily.   BIOTIN 1000 MCG TABLET    Take 5,000 mcg by mouth daily.    CALCIUM CARBONATE-VITAMIN D (CALCIUM + D PO)    Take 1 tablet by mouth daily. 1200mg  of Calcium and 1000mg  Vit D   CYANOCOBALAMIN (VITAMIN B 12 PO)    Take 1 tablet by mouth daily.    DIGOXIN (LANOXIN)  0.125 MG TABLET       ESTRACE VAGINAL 0.1 MG/GM VAGINAL CREAM    Place 1 g vaginally 2 (two) times a week.    ESTROGEN-METHYLTESTOSTERONE (COVARYX HS) 0.625-1.25 MG PER TABLET    Take 1 tablet by mouth daily.   FUROSEMIDE (LASIX) 20 MG TABLET    Take 1 tablet (20 mg total) by mouth daily as needed. For fluid retention   LEVOTHYROXINE (SYNTHROID, LEVOTHROID) 112 MCG TABLET    One daily for thyroid supplement   MAGNESIUM 250 MG TABS    Take 1 tablet by mouth daily.    METOPROLOL TARTRATE (LOPRESSOR) 25 MG TABLET    Take 12.5 mg by mouth daily.    MULTIPLE VITAMIN (MULTIVITAMIN WITH MINERALS) TABS    Take 1 tablet by mouth daily.   OVER THE COUNTER MEDICATION    Place 1 drop into both eyes daily as needed. OTC Dry Eyes Eyedrop  For dry eyes   POTASSIUM CHLORIDE SA (K-DUR,KLOR-CON) 20 MEQ TABLET    Take 1 tablet (20 mEq total) by mouth daily as needed. When taking the lasix   RABEPRAZOLE (ACIPHEX) 20 MG TABLET    Take 20 mg by mouth 2 (two) times daily.   TRANDOLAPRIL (MAVIK) 2 MG TABLET    Take 1 tablet (2  mg total) by mouth 2 (two) times daily. Resume this medication  Modified Medications   No medications on file  Discontinued Medications   No medications on file     Review of Systems  Constitutional: Positive for appetite change, fatigue and unexpected weight change. Negative for fever and chills.  HENT: Positive for congestion, hearing loss, postnasal drip, rhinorrhea and sinus pressure. Negative for ear discharge, ear pain, facial swelling, nosebleeds, sneezing and tinnitus.   Eyes: Negative.   Respiratory: Positive for cough and shortness of breath. Negative for wheezing.   Cardiovascular: Positive for palpitations. Negative for chest pain and leg swelling.  Gastrointestinal: Negative for nausea, abdominal pain, diarrhea, blood in stool and abdominal distention.       Hx erosive gastritis, HH, Barrett's esophagus. Persistent, uncomfortable lump in her right groin.  Endocrine:  Negative.   Genitourinary:       Nocturia x 3. Vaginal discomfort. Uses hormone cream from GYN, Dr. Nori Riis  Musculoskeletal: Positive for arthralgias, back pain, neck pain and neck stiffness. Negative for gait problem, joint swelling and myalgias.  Skin: Negative for pallor, rash and wound.       S/P right herniorraphy. Right index finger has onycholysis distally and tenderness.  Neurological: Positive for dizziness, numbness and headaches. Negative for tremors, seizures, syncope and speech difficulty.  Hematological: Negative.   Psychiatric/Behavioral: Positive for dysphoric mood. The patient is nervous/anxious.     Filed Vitals:   08/08/13 1522  BP: 128/80  Pulse: 63  Temp: 97.1 F (36.2 C)  TempSrc: Oral  Resp: 10  Weight: 135 lb (61.236 kg)  SpO2: 98%   Body mass index is 20.67 kg/(m^2).  Physical Exam  Constitutional:  Frail, thin body. Losing weight.  HENT:  Right Ear: External ear normal.  Left Ear: External ear normal.  Mouth/Throat: Oropharynx is clear and moist.  Nasal polyp  Eyes:  Corrective lensees  Neck: No JVD present. No tracheal deviation present. No thyromegaly present.  stiff  Cardiovascular: Normal rate and regular rhythm.  Exam reveals no gallop and no friction rub.   No murmur heard. AICD in left upper chest. Raynaud's disease.  Pulmonary/Chest: Effort normal. No respiratory distress. She has no wheezes. She has rales. She exhibits no tenderness.  Abdominal: Soft. Bowel sounds are normal. She exhibits no distension and no mass. There is no tenderness.  Right inguinal area has scar from surgery.  Small lymph node in left groin. Midline incisional hernia that is small and about 3" below the navel.  Musculoskeletal: Normal range of motion. She exhibits no edema.  Tender right knee.   Lymphadenopathy:    She has no cervical adenopathy.  Neurological:  Reduced vibratory sensation in both feet.  Skin: No rash noted. No erythema. No pallor.    Onycholysis of right index fingeer. Tender to palpation.  Psychiatric:  Anxious. Depressed affect. Low energy.     Labs reviewed: Lab on 08/06/2013  Component Date Value Ref Range Status  . TSH 08/06/2013 3.290  0.450 - 4.500 uIU/mL Final  . Glucose 08/06/2013 78  65 - 99 mg/dL Final  . BUN 08/06/2013 12  8 - 27 mg/dL Final  . Creatinine, Ser 08/06/2013 0.85  0.57 - 1.00 mg/dL Final  . GFR calc non Af Amer 08/06/2013 66  >59 mL/min/1.73 Final  . GFR calc Af Amer 08/06/2013 76  >59 mL/min/1.73 Final  . BUN/Creatinine Ratio 08/06/2013 14  11 - 26 Final  . Sodium 08/06/2013 139  134 - 144 mmol/L Final  .  Potassium 08/06/2013 4.1  3.5 - 5.2 mmol/L Final  . Chloride 08/06/2013 101  97 - 108 mmol/L Final  . CO2 08/06/2013 24  18 - 29 mmol/L Final  . Calcium 08/06/2013 9.4  8.7 - 10.3 mg/dL Final  . Total Protein 08/06/2013 6.5  6.0 - 8.5 g/dL Final  . Albumin 08/06/2013 4.4  3.5 - 4.8 g/dL Final  . Globulin, Total 08/06/2013 2.1  1.5 - 4.5 g/dL Final  . Albumin/Globulin Ratio 08/06/2013 2.1  1.1 - 2.5 Final  . Total Bilirubin 08/06/2013 0.5  0.0 - 1.2 mg/dL Final  . Alkaline Phosphatase 08/06/2013 82  39 - 117 IU/L Final  . AST 08/06/2013 23  0 - 40 IU/L Final  . ALT 08/06/2013 16  0 - 32 IU/L Final  . Digoxin Level 08/06/2013 <0.2* 0.9 - 2.0 ng/mL Final   Comment: Concentrations above 2.0 ng/mL are generally considered                          toxic. Some overlap of toxic and non-toxic values have                          been reported.                                                      Detection Limit = 0.2 ng/mL  . Specific Gravity, UA 08/06/2013 CANCELED   Final-Edited   Comment: Please refer to the following specimen for additional lab results.                          specimen [id number]                                                    Result canceled by the ancillary  . pH, UA 08/06/2013 CANCELED   Final-Edited   Comment: Test not performed                                                     Result canceled by the ancillary  . Protein, UA 08/06/2013 CANCELED   Final-Edited   Comment: Test not performed                                                    Result canceled by the ancillary  . Glucose, UA 08/06/2013 CANCELED   Final-Edited   Comment: Test not performed                                                    Result canceled by the ancillary  . Ketones, UA 08/06/2013 CANCELED  Final-Edited   Comment: Test not performed                                                    Result canceled by the ancillary  . Urine Culture, Routine 08/06/2013 Final report   Final  . Result 1 08/06/2013 Comment   Final   Comment: Mixed urogenital flora                          50,000-100,000 colony forming units per mL  . specimen status report 08/06/2013 Comment   Final   Comment: Written Authorization                          Written Authorization                          Written Authorization Received.                          Authorization received from original request 08-07-2013                          Logged by [employer]                          There is no longer any sample available                          for the following requested testing.                          Test # [id number] Urinalysis, Routine      Assessment/Plan  1. Hypothyroidism compensated  2. Abdominal pain, unspecified site Referred to GI, Dr. Ardis Hughs, for possible need for eGD for hx of Barrett's esophagus and another opinion regarding the lower abd discomfort.  3. Anxiety disorder Will try Pristiq. She would like to try samples first and we do not have any at present.  4. Chronic systolic heart failure compensated  5. GERD To se GI. HX Barrett's  6. Hair thinning improving  7. Other malaise and fatigue Try Pristiq  8. Depressive disorder, not elsewhere classified Try Pristiq

## 2013-08-08 NOTE — Patient Instructions (Addendum)
Continue medications as listed 

## 2013-08-09 ENCOUNTER — Telehealth: Payer: Self-pay | Admitting: *Deleted

## 2013-08-09 NOTE — Telephone Encounter (Signed)
Patient called and wanted to know if you would prescribe her an antibiotic for her sinuses. Please Advise.

## 2013-08-10 ENCOUNTER — Other Ambulatory Visit: Payer: Self-pay | Admitting: Internal Medicine

## 2013-08-10 DIAGNOSIS — J019 Acute sinusitis, unspecified: Secondary | ICD-10-CM

## 2013-08-10 MED ORDER — AZITHROMYCIN 250 MG PO TABS: ORAL_TABLET | ORAL | Status: DC

## 2013-08-10 NOTE — Telephone Encounter (Signed)
Prescription for azithromycin sent to pharmacy.

## 2013-08-13 NOTE — Telephone Encounter (Signed)
Patient Notified

## 2013-08-15 ENCOUNTER — Telehealth (INDEPENDENT_AMBULATORY_CARE_PROVIDER_SITE_OTHER): Payer: Self-pay

## 2013-08-15 ENCOUNTER — Other Ambulatory Visit: Payer: Self-pay | Admitting: *Deleted

## 2013-08-15 ENCOUNTER — Telehealth: Payer: Self-pay

## 2013-08-15 ENCOUNTER — Telehealth: Payer: Self-pay | Admitting: *Deleted

## 2013-08-15 MED ORDER — DESVENLAFAXINE SUCCINATE ER 50 MG PO TB24: 50.0000 mg | ORAL_TABLET | Freq: Every day | ORAL | Status: DC

## 2013-08-15 NOTE — Telephone Encounter (Signed)
Patient called triage call requesting a call back.  I spoke with patient, patient states she was calling to let Dr.Green know that she contacted the General Surgeon's office and is awaiting a return call. Patient called because she states that she has noticed a hard, swollen area at the site.  FYI ONLY

## 2013-08-15 NOTE — Telephone Encounter (Signed)
Called patient and made her an apt to come in to see Dr. [name] on 09-06-2013 @ 3:00. I told her if I get an cancellation I will call her back and move her up. She stated that she did not want any other Doctors but Dr [name]

## 2013-08-15 NOTE — Telephone Encounter (Signed)
Spoke with patient regarding prescription for Pristiq, informed her that I would be sending it in the mail with the coupon. Patient states that would be wonderful.

## 2013-08-15 NOTE — Telephone Encounter (Signed)
Pt s/p Laparoscopy, exploratory laparotomy with small bowel resection on 01/04/13 with Dr Lilyan Punt. Pt has had 3 surgeries last year by 3 different surgeons (Rosenbower right inguinal hernia 09/26/12) Dalbert Batman small bowel dissection 12/25/12). Pt states that she would like for Dr [name] to take over her care she has been referred by her PCP. Pt is calling today in regards to her incision site with Dr Lilyan Punt. She states that she has noticed a hard, swollen area at the site. Pt denies any fevers, chills, or draining from the area. Informed pt that I would need to speak with Dr [name] and his assistant to see if he would be willing to take over her care before making a appt for pt. Informed pt that we would get back in touch with her and let her know. Pt verbalized understanding and agrees with POC.

## 2013-08-28 ENCOUNTER — Telehealth: Payer: Self-pay | Admitting: *Deleted

## 2013-08-28 NOTE — Telephone Encounter (Signed)
Patient called and stated that she was having trouble with her stomach and was in pain. Patient was very tearful and afraid with whats going on with her. Stated that she couldn't get an appointment with Dr. [name] until next Thursday and she felt she could not wait that long. Offered her an appointment here but she did not want to see anyone else but Dr. Nyoka Cowden and he is on vacation this week. Offered to call [facility] and see if I could get her a sooner appointment. She agreed. I called them and spoke with Delilah Shan and she stated that they did not have anything available. She called back and stated that Dr. [name] had something come available for 08/29/2013 at 10:00am. Thanked her and called patient.

## 2013-08-29 ENCOUNTER — Other Ambulatory Visit (INDEPENDENT_AMBULATORY_CARE_PROVIDER_SITE_OTHER): Payer: Self-pay | Admitting: General Surgery

## 2013-08-29 ENCOUNTER — Ambulatory Visit (INDEPENDENT_AMBULATORY_CARE_PROVIDER_SITE_OTHER): Payer: Medicare Other | Admitting: General Surgery

## 2013-08-29 ENCOUNTER — Encounter (INDEPENDENT_AMBULATORY_CARE_PROVIDER_SITE_OTHER): Payer: Self-pay | Admitting: General Surgery

## 2013-08-29 VITALS — BP 122/80 | HR 71 | Temp 97.1°F | Ht 68.0 in | Wt 134.0 lb

## 2013-08-29 DIAGNOSIS — K458 Other specified abdominal hernia without obstruction or gangrene: Secondary | ICD-10-CM

## 2013-08-29 DIAGNOSIS — K432 Incisional hernia without obstruction or gangrene: Secondary | ICD-10-CM | POA: Diagnosis not present

## 2013-08-29 NOTE — Patient Instructions (Signed)
Ventral Hernia A ventral hernia (also called an incisional hernia) is a hernia that occurs at the site of a previous surgical cut (incision) in the abdomen. The abdominal wall spans from your lower chest down to your pelvis. If the abdominal wall is weakened from a surgical incision, a hernia can occur. A hernia is a bulge of bowel or muscle tissue pushing out on the weakened part of the abdominal wall. Ventral hernias can get bigger from straining or lifting. Obese and older people are at higher risk for a ventral hernia. People who develop infections after surgery or require repeat incisions at the same site on the abdomen are also at increased risk. CAUSES  A ventral hernia occurs because of weakness in the abdominal wall at an incision site.  SYMPTOMS  Common symptoms include:  A visible bulge or lump on the abdominal wall.  Pain or tenderness around the lump.  Increased discomfort if you cough or make a sudden movement. If the hernia has blocked part of the intestine, a serious complication can occur (incarcerated or strangulated hernia). This can become a problem that requires emergency surgery because the blood flow to the blocked intestine may be cut off. Symptoms may include:  Feeling sick to your stomach (nauseous).  Throwing up (vomiting).  Stomach swelling (distention) or bloating.  Fever.  Rapid heartbeat. DIAGNOSIS  Your caregiver will take a medical history and perform a physical exam. Various tests may be ordered, such as:  Blood tests.  Urine tests.  Ultrasonography.  X-rays.  Computed tomography (CT). TREATMENT  Watchful waiting may be all that is needed for a smaller hernia that does not cause symptoms. Your caregiver may recommend the use of a supportive belt (truss) that helps to keep the abdominal wall intact. For larger hernias or those that cause pain, surgery to repair the hernia is usually recommended. If a hernia becomes strangulated, emergency surgery  needs to be done right away. HOME CARE INSTRUCTIONS  Avoid putting pressure or strain on the abdominal area.  Avoid heavy lifting.  Use good body positioning for physical tasks. Ask your caregiver about proper body positioning.  Use a supportive belt as directed by your caregiver.  Maintain a healthy weight.  Eat foods that are high in fiber, such as whole grains, fruits, and vegetables. Fiber helps prevent difficult bowel movements (constipation).  Drink enough fluids to keep your urine clear or pale yellow.  Follow up with your caregiver as directed. SEEK MEDICAL CARE IF:   Your hernia seems to be getting larger or more painful. SEEK IMMEDIATE MEDICAL CARE IF:   You have abdominal pain that is sudden and sharp.  Your pain becomes severe.  You have repeated vomiting.  You are sweating a lot.  You notice a rapid heartbeat.  You develop a fever. MAKE SURE YOU:   Understand these instructions.  Will watch your condition.  Will get help right away if you are not doing well or get worse. Document Released: 02/09/2012 Document Reviewed: 02/09/2012 [facility] Patient Information 2015 [place]. This information is not intended to replace advice given to you by your health care provider. Make sure you discuss any questions you have with your health care provider.  Laparoscopic Ventral Hernia Repair Laparoscopic ventral hernia repairis a surgery to fix a ventral hernia. Aventral hernia, also called an incisional hernia, is a bulge of body tissue or intestines that pushes through the front part of the abdomen. This can happen if the connective tissue covering the  muscles over the abdomen has a weak spot or is torn because of a surgical cut (incision) from a previous surgery. Laparoscopic ventral hernia repair is often done soon after diagnosis to stop the hernia from getting bigger, becoming uncomfortable, or becoming an emergency. This surgery usually takes about 2 hours,  but the time can vary greatly. LET [facility] CARE PROVIDER KNOW ABOUT:  Any allergies you have.  All medicines you are taking, including steroids, vitamins, herbs, eye drops, creams, and over-the-counter medicines.  Previous problems you or members of your family have had with the use of anesthetics.  Any blood disorders you have.  Previous surgeries you have had.  Medical conditions you have. RISKS AND COMPLICATIONS  Generally, laparoscopic ventral hernia repair is a safe procedure. However, as with any surgical procedure, problems can occur. Possible problems include:  Bleeding.  Trouble passing urine or having a bowel movement after the surgery.  Infection.  Pneumonia.  Blood clots.  Pain in the area of the hernia.  A bulge in the area of the hernia that may be caused by a collection of fluid.  Injury to intestines or other structures in the abdomen.  Return of the hernia after surgery. In some cases, your health care provider may need to stop the laparoscopic procedure and do regular, open surgery. This may be necessary for very difficult hernias, when organs are hard to see, or when bleeding problems occur during surgery. BEFORE THE PROCEDURE   You may need to have blood tests, urine tests, a chest X-ray, or an electrocardiogram done before the day of the surgery.  Ask your health care provider about changing or stopping your regular medicines. This is especially important if you are taking diabetes medicines or blood thinners.  You may need to wash with a special type of germ-killing soap.  Do not eat or drink anything after midnight the night before the procedure or as directed by your health care provider.  Make plans to have someone drive you home after the procedure. PROCEDURE   Small monitors will be put on your body. They are used to check your heart, blood pressure, and oxygen level.  An IV access tube will be put into a vein in your hand or arm.  Fluids and medicine will flow directly into your body through the IV tube.  You will be given medicine that makes you go to sleep (general anesthetic).  Your abdomen will be cleaned with a special soap to kill any germs on your skin.  Once you are asleep, several small incisions will be made in your abdomen.  The large space in your abdomen will be filled with air so that it expands. This gives your health care provider more room and a better view.  A thin, lighted tube with a tiny camera on the end (laparoscope) is put through a small incision in your abdomen. The camera on the laparoscope sends a picture to a TV screen in the operating room. This gives your health care provider a good view inside your abdomen.  Hollow tubes are put through the other small incisions in your abdomen. The tools needed for the procedure are put through these tubes.  Your health care provider puts the tissue or intestines that formed the hernia back in place.  A screen-like patch (mesh) is used to close the hernia. This helps make the area stronger. Stitches, tacks, or staples are used to keep the mesh in place.  Medicine and a bandage (dressing)  or skin glue will be put over the incisions. AFTER THE PROCEDURE   You will stay in a recovery area until the anesthetic wears off. Your blood pressure and pulse will be checked often.  You may be able to go home the same day or may need to stay in the hospital for 1-2 days after surgery. Your health care provider will decide when you can go home.  You may feel some pain. You may be given medicine for pain.  You will be urged to do breathing exercises that involve taking deep breaths. This helps prevent a lung infection after a surgery.  You may have to wear compression stockings while you are in the hospital. These stockings help keep blood clots from forming in your legs. Document Released: 02/09/2012 Document Revised: 02/27/2013 Document Reviewed:  02/09/2012 [facility] Patient Information 2015 [place], [place]. This information is not intended to replace advice given to you by your health care provider. Make sure you discuss any questions you have with your health care provider.

## 2013-08-31 NOTE — Progress Notes (Signed)
Patient ID: [name], female   DOB: 13-Feb-1933, 80 y.o.   MRN: [id number]  Chief Complaint  Patient presents with  . eval hernia    HPI [name] is a 80 y.o. female.   HPI 80 year old Caucasian female sent to our office from Dr. Nyoka Cowden because of concerns for ventral hernia. The patient has a complicated abdominal surgical history. She underwent an open repair of a right indirect inguinal hernia with mesh on 09/26/2012 by Dr. Zella Richer. She developed an incarcerated right femoral hernia in October. She underwent groin exploration on October 20 by Dr. Dalbert Batman. She was found to have a strangulated right femoral hernia and underwent a small bowel resection with primary tissue repair. Her ileus never resolved and she was taken back to the operating room on October 30 by Dr. Lilyan Punt where she underwent exploratory laparotomy, small bowel resection as well as primary tissue repair of right femoral hernia. She states she is just very frustrated about all the surgeries she had to endure last year. She states on May 8 she felt a loud noise in her abdomen and since that time her abdomen has been swollen. She initially had epigastric pain and saw her gastroenterologist who thought it was more due to her gastroesophageal reflux disease. The epigastric pain improved and resolved but she continued to have swelling in her lower abdomen. She states that she occasionally feels a hard place in her lower midline. She denies nausea vomiting. She denies fevers or chills. She has a bowel movement daily. She denies any melena or hematochezia. She states her lower abdomen just feels thicker and more swollen. Past Medical History  Diagnosis Date  . Nonischemic cardiomyopathy     EF initially 20%; Last measurement up to 50% per echo in August of 2012  . CHF (congestive heart failure)   . LBBB (left bundle branch block)   . Mitral regurgitation   . Melanoma     right arm  . Hypothyroidism   . Erosive gastritis  08/2004  . Hyperplastic polyps of stomach 06/2002  . Barrett's esophagus 04/2008  . GERD (gastroesophageal reflux disease)   . Diverticulitis   . Anxiety disorder   . HTN (hypertension)   . ICD (implantable cardiac defibrillator) in place     BiV/ICD; s/p removal of ICD with insertion of BiV PPM 04/26/12  . Hair thinning   . Unspecified sinusitis (chronic)   . Unspecified chronic bronchitis   . Depressive disorder, not elsewhere classified   . Restless legs syndrome (RLS)   . Chronic airway obstruction, not elsewhere classified   . Benign neoplasm of colon   . Other and unspecified hyperlipidemia   . Unspecified nasal polyp   . Pain in joint, lower leg   . Synovial cyst of popliteal space   . Complication of anesthesia     slow to wake up  . Pacemaker 04/26/2012  . Sleep apnea   . Other and unspecified hyperlipidemia   . Dysthymic disorder   . Palpitations     Past Surgical History  Procedure Laterality Date  . Defibrillator insertion      s/p removal of previously implanted BiV ICD and insertion of a new BiV pacemaker on 04/26/12  . Left arm surgery    . Abdominal hysterectomy  1076    endometriosis  . Right breast lumpectomy  1988    Dr. Marylene Buerger  . Appendectomy    . Tonsillectomy    . Right knee surgery  arthroscopy: Dr. Shellia Carwin  . Excision of wen  1984    Dr. Parks Ranger  . Excise mole of lip  2001    Dr. Larena Sox  . Inguinal hernia repair Right 09/26/2012    Procedure: RIGHT INGUINAL HERNIA REPAIR WITH MESH;  Surgeon: Odis Hollingshead, MD;  Location: [facility];  Service: General;  Laterality: Right;  . Insertion of mesh Right 09/26/2012    Procedure: INSERTION OF MESH;  Surgeon: Odis Hollingshead, MD;  Location: [facility];  Service: General;  Laterality: Right;  . Inguinal hernia repair Right 12/25/2012    Procedure: explor right groin, small bowel rescection, tissue repair right femoral hernia;  Surgeon: Adin Hector, MD;  Location: [facility];  Service: General;   Laterality: Right;  . Laparoscopy N/A 01/04/2013    Procedure: Diagnostic Laparoscopy, exploratory laparotomy with small bowel resection, closure of right femoral hernia repair;  Surgeon: Madilyn Hook, DO;  Location: [facility];  Service: General;  Laterality: N/A;  . Femoral hernia repair  12/25/2012    Dr Dalbert Batman  . Femoral hernia repair  01/04/2013    Recurrent - Dr. Lilyan Punt    Family History  Problem Relation Age of Onset  . Heart disease Other     maternal side  . Colon cancer Paternal Uncle   . Breast cancer Maternal Aunt   . Diabetes Neg Hx   . Stroke Father     Social History History  Substance Use Topics  . Smoking status: Former Smoker    Types: Cigarettes    Quit date: 12/22/1993  . Smokeless tobacco: Never Used     Comment: Does'nt reall year quit   . Alcohol Use: No    Allergies  Allergen Reactions  . Hydrocodone Itching  . Cephalexin Itching and Swelling  . Doxycycline Other (See Comments)    Per pt: unknown  . Ofloxacin Other (See Comments)    Per pt: unknown  . Tape Other (See Comments)    Burns skin.   . Latex Rash    Current Outpatient Prescriptions  Medication Sig Dispense Refill  . acetaminophen (TYLENOL) 500 MG tablet Take 500 mg by mouth every 6 (six) hours as needed for pain. For pain      . Ascorbic Acid (VITAMIN C) 1000 MG tablet Take 1,000 mg by mouth daily.      . Biotin 1000 MCG tablet Take 5,000 mcg by mouth daily.       . Calcium Carbonate-Vitamin D (CALCIUM + D PO) Take 1 tablet by mouth daily. 1200mg  of Calcium and 1000mg  Vit D      . Cyanocobalamin (VITAMIN B 12 PO) Take 1 tablet by mouth daily.       Marland Kitchen desvenlafaxine (PRISTIQ) 50 MG 24 hr tablet Take 1 tablet (50 mg total) by mouth daily.  30 tablet  1  . ESTRACE VAGINAL 0.1 MG/GM vaginal cream Place 1 g vaginally 2 (two) times a week.       . estrogen-methylTESTOSTERone (COVARYX HS) 0.625-1.25 MG per tablet Take 1 tablet by mouth daily.  30 tablet  5  . furosemide (LASIX) 20 MG tablet  Take 1 tablet (20 mg total) by mouth daily as needed. For fluid retention  90 tablet  3  . levothyroxine (SYNTHROID, LEVOTHROID) 112 MCG tablet One daily for thyroid supplement  90 tablet  3  . Magnesium 250 MG TABS Take 1 tablet by mouth daily.       . metoprolol tartrate (LOPRESSOR) 25 MG tablet Take 12.5  mg by mouth daily.       . Multiple Vitamin (MULTIVITAMIN WITH MINERALS) TABS Take 1 tablet by mouth daily.      Marland Kitchen OVER THE COUNTER MEDICATION Place 1 drop into both eyes daily as needed. OTC Dry Eyes Eyedrop  For dry eyes      . potassium chloride SA (K-DUR,KLOR-CON) 20 MEQ tablet Take 1 tablet (20 mEq total) by mouth daily as needed. When taking the lasix  90 tablet  3  . RABEprazole (ACIPHEX) 20 MG tablet Take 20 mg by mouth 2 (two) times daily.      . trandolapril (MAVIK) 2 MG tablet Take 1 tablet (2 mg total) by mouth 2 (two) times daily. Resume this medication  180 tablet  6  . [DISCONTINUED] omeprazole (PRILOSEC) 20 MG capsule Take 1 capsule (20 mg total) by mouth daily.  30 capsule  11   No current facility-administered medications for this visit.    Review of Systems Review of Systems  Constitutional: Negative for fever, activity change, appetite change and unexpected weight change.  HENT: Negative for nosebleeds and trouble swallowing.   Eyes: Negative for photophobia and visual disturbance.  Respiratory: Negative for chest tightness and shortness of breath.   Cardiovascular: Negative for chest pain and leg swelling.       Denies CP, SOB, orthopnea, PND, DOE  Genitourinary: Negative for dysuria and difficulty urinating.  Musculoskeletal: Negative for arthralgias.  Skin: Negative for pallor and rash.  Neurological: Negative for dizziness, seizures, facial asymmetry and numbness.       Denies TIA and amaurosis fugax   Hematological: Negative for adenopathy. Does not bruise/bleed easily.  Psychiatric/Behavioral: Negative for behavioral problems and agitation.    Blood  pressure 122/80, pulse 71, temperature 97.1 F (36.2 C), height 5\' 8"  (1.727 m), weight 134 lb (60.782 kg).  Physical Exam Physical Exam  Vitals reviewed. Constitutional: She is oriented to person, place, and time. She appears well-developed and well-nourished. No distress.  thin  HENT:  Head: Normocephalic and atraumatic.  Right Ear: External ear normal.  Left Ear: External ear normal.  Eyes: Conjunctivae are normal. No scleral icterus.  Neck: Normal range of motion. Neck supple. No tracheal deviation present. No thyromegaly present.  Cardiovascular: Normal rate and normal heart sounds.   Pulmonary/Chest: Effort normal and breath sounds normal. No stridor. No respiratory distress. She has no wheezes.    Abdominal: Soft. She exhibits no distension. There is no rebound and no guarding.    She has a fascial defect below her umbilicus. It is completely reducible. The width of the defect is about 2.5 x 2.5. There is no skin changes. She is mildly tender when you press on the area when standing. There is no appreciable lower midline or other ventral hernias. Do not appreciate a recurrent groin hernia.  Musculoskeletal: She exhibits no edema and no tenderness.  Lymphadenopathy:    She has no cervical adenopathy.  Neurological: She is alert and oriented to person, place, and time. She exhibits normal muscle tone.  Skin: Skin is warm and dry. No rash noted. She is not diaphoretic. No erythema.  Psychiatric: Her behavior is normal. Judgment and thought content normal. Her mood appears anxious.    Data Reviewed Dr Bertrum Sol op note Dr Darrel Hoover op note Dr Dyane Dustman op note Dr Rolly Salter note  Assessment    Ventral incisional hernia H/o heart failure H/o ICD     Plan    She appears to have developed a ventral incisional  hernia. There is no sign of incarceration or strangulation. Because of her persistent complaints of feeling like her abdomen is swollen I think the safest thing to do  is to get a CT scan of her abdomen and pelvis to make sure there is no signs of stricture at her anastomosis so as to rule out any other fascial defects.  We discussed the etiology of ventral hernias. We discussed the signs and symptoms of incarceration and strangulation. The patient was given educational material. I also drew diagrams.  We discussed nonoperative and operative management. With respect to operative management, we discussed both open repair and laparoscopic repair. We discussed the pros and cons of each approach. I discussed the typical aftercare with each procedure and how each procedure differs. The patient is interested in minimal wound complications and therefore I recommended a laparoscopic approach.  The patient has elected to proceed with a laparoscopic repair of ventral incisional hernia with mesh  We discussed the risk and benefits of surgery including but not limited to bleeding, infection, injury to surrounding structures, hernia recurrence, mesh complications, hematoma/seroma formation, need to convert to an open procedure, blood clot formation, urinary retention, post operative ileus, general anesthesia risk, long-term abdominal pain. We discussed that this procedure can be quite uncomfortable and difficult to recover from based on how the mesh is secured to the abdominal wall. We discussed the importance of avoiding heavy lifting and straining for a period of 6 weeks.   First  going to get a CT scan of her abdomen and pelvis. If there are no other abnormalities she will be scheduled for laparoscopic repair of her ventral incisional hernia with mesh. I explained to the patient that she will need cardiology clearance prior to surgery  [name]. [name], MD, FACS General, Bariatric, & Minimally Invasive Surgery [facility], [place]        [facility] M 08/31/2013, 10:15 AM

## 2013-09-03 ENCOUNTER — Ambulatory Visit
Admission: RE | Admit: 2013-09-03 | Discharge: 2013-09-03 | Disposition: A | Discharge status: Home / Self Care | Payer: Medicare Other | Source: Ambulatory Visit | Attending: General Surgery | Admitting: General Surgery

## 2013-09-03 DIAGNOSIS — K458 Other specified abdominal hernia without obstruction or gangrene: Secondary | ICD-10-CM

## 2013-09-03 DIAGNOSIS — R198 Other specified symptoms and signs involving the digestive system and abdomen: Secondary | ICD-10-CM | POA: Diagnosis not present

## 2013-09-03 MED ORDER — IOHEXOL 300 MG/ML  SOLN
100.0000 mL | Freq: Once | INTRAMUSCULAR | Status: AC | PRN
  Administered 2013-09-03: 100 mL via INTRAVENOUS

## 2013-09-06 ENCOUNTER — Ambulatory Visit (INDEPENDENT_AMBULATORY_CARE_PROVIDER_SITE_OTHER): Payer: Federal, State, Local not specified - PPO | Admitting: General Surgery

## 2013-09-10 ENCOUNTER — Telehealth (INDEPENDENT_AMBULATORY_CARE_PROVIDER_SITE_OTHER): Payer: Self-pay | Admitting: General Surgery

## 2013-09-10 NOTE — Telephone Encounter (Signed)
Called patient to let her know that there was no evidence of recurrent right groin hernia. No sign of bowel obstruction. Just that 1 abdominal wall hernia just below the belly button. I told her that our scheduler will call her one day this week, I also told her that I have cardiac clearance here also. Patient understood, [name] has patient orders

## 2013-09-10 NOTE — Telephone Encounter (Signed)
Message copied by Maryclare Bean on Mon Sep 10, 2013 11:28 AM ------      Message from: Joya San      Created: Mon Sep 10, 2013 11:11 AM      Contact: [phone]       Pt calling in concerning results of a CT scan done a week ago. ------

## 2013-09-19 ENCOUNTER — Ambulatory Visit (INDEPENDENT_AMBULATORY_CARE_PROVIDER_SITE_OTHER): Payer: Medicare Other | Admitting: Internal Medicine

## 2013-09-19 ENCOUNTER — Ambulatory Visit: Payer: Medicare Other | Admitting: Gastroenterology

## 2013-09-19 ENCOUNTER — Encounter: Payer: Self-pay | Admitting: Internal Medicine

## 2013-09-19 ENCOUNTER — Other Ambulatory Visit (INDEPENDENT_AMBULATORY_CARE_PROVIDER_SITE_OTHER): Payer: Self-pay | Admitting: General Surgery

## 2013-09-19 VITALS — BP 130/82 | HR 55 | Temp 97.1°F | Wt 137.0 lb

## 2013-09-19 DIAGNOSIS — F3289 Other specified depressive episodes: Secondary | ICD-10-CM

## 2013-09-19 DIAGNOSIS — F329 Major depressive disorder, single episode, unspecified: Secondary | ICD-10-CM

## 2013-09-19 DIAGNOSIS — R5383 Other fatigue: Secondary | ICD-10-CM | POA: Diagnosis not present

## 2013-09-19 DIAGNOSIS — K432 Incisional hernia without obstruction or gangrene: Secondary | ICD-10-CM

## 2013-09-19 DIAGNOSIS — R5381 Other malaise: Secondary | ICD-10-CM | POA: Diagnosis not present

## 2013-09-19 DIAGNOSIS — J42 Unspecified chronic bronchitis: Secondary | ICD-10-CM

## 2013-09-19 DIAGNOSIS — F411 Generalized anxiety disorder: Secondary | ICD-10-CM

## 2013-09-19 NOTE — Patient Instructions (Signed)
Continue current medication.

## 2013-09-19 NOTE — Progress Notes (Signed)
Dr. [name] - Please enter preop orders in Epic for [name] - she is coming to [facility] on Friday  7/17 for her preop / labs.  Thanks.

## 2013-09-19 NOTE — Progress Notes (Signed)
Patient ID: [name], female   DOB: 12/27/1932, 80 y.o.   MRN: [id number]    Location:    [facility]  Place of Service:  OFFICE     Allergies  Allergen Reactions  . Hydrocodone Itching  . Cephalexin Itching and Swelling  . Doxycycline Other (See Comments)    Per pt: unknown  . Ofloxacin Other (See Comments)    Per pt: unknown  . Tape Other (See Comments)    Burns skin.   . Latex Rash    Chief Complaint  Patient presents with  . Advice Only    Discuss pending hernia operation scheduled for 10/01/13    HPI:  Incisional hernia without mention of obstruction or gangrene: scheduled for repair by Dr. Greer Pickerel.  Strained left groin area when leaning forward and trying to push against an exit bar for a door. Seems to better, but she was afraid she had caused another hernia or some other damage initially. Walking comfortably today.  Unspecified chronic bronchitis: intermittent Burleigh Brockmann sputum. Coughing.  Other malaise and fatigue: "why?". Has depression and chronic anxiety, recurrent bronchitis, hx of CHF with cardiomyopathy (improved). I don't know why she is so fatigued, but I suspect psychologic issues.  Depressive disorder, not elsewhere classified: has not started desvenlafaxine due to cost locally, but says she has sent prescription to mail order.  Generalized anxiety disorder:chronic and moderately accentuated by her present concerns about her hernia and upcoming surgery.    Medications: Patient's Medications  New Prescriptions   No medications on file  Previous Medications   ACETAMINOPHEN (TYLENOL) 500 MG TABLET    Take 500 mg by mouth every 6 (six) hours as needed for pain. For pain   ASCORBIC ACID (VITAMIN C) 1000 MG TABLET    Take 1,000 mg by mouth daily.   BIOTIN 1000 MCG TABLET    Take 5,000 mcg by mouth daily.    CALCIUM CARBONATE-VITAMIN D (CALCIUM + D PO)    Take 1 tablet by mouth daily. 1200mg  of Calcium and 1000mg  Vit D   CYANOCOBALAMIN (VITAMIN B 12 PO)     Take 1 tablet by mouth daily.    DESVENLAFAXINE (PRISTIQ) 50 MG 24 HR TABLET    Take 1 tablet (50 mg total) by mouth daily.   ESTRACE VAGINAL 0.1 MG/GM VAGINAL CREAM    Place 1 g vaginally 2 (two) times a week.    ESTROGEN-METHYLTESTOSTERONE (COVARYX HS) 0.625-1.25 MG PER TABLET    Take 1 tablet by mouth daily.   FUROSEMIDE (LASIX) 20 MG TABLET    Take 1 tablet (20 mg total) by mouth daily as needed. For fluid retention   LEVOTHYROXINE (SYNTHROID, LEVOTHROID) 112 MCG TABLET    One daily for thyroid supplement   MAGNESIUM 250 MG TABS    Take 1 tablet by mouth daily.    METOPROLOL TARTRATE (LOPRESSOR) 25 MG TABLET    Take 12.5 mg by mouth daily.    MULTIPLE VITAMIN (MULTIVITAMIN WITH MINERALS) TABS    Take 1 tablet by mouth daily.   OVER THE COUNTER MEDICATION    Place 1 drop into both eyes daily as needed. OTC Dry Eyes Eyedrop  For dry eyes   POTASSIUM CHLORIDE SA (K-DUR,KLOR-CON) 20 MEQ TABLET    Take 1 tablet (20 mEq total) by mouth daily as needed. When taking the lasix   RABEPRAZOLE (ACIPHEX) 20 MG TABLET    Take 20 mg by mouth 2 (two) times daily.   TRANDOLAPRIL (MAVIK) 2 MG TABLET  Take 1 tablet (2 mg total) by mouth 2 (two) times daily. Resume this medication  Modified Medications   No medications on file  Discontinued Medications   No medications on file     Review of Systems  Constitutional: Positive for appetite change, fatigue and unexpected weight change. Negative for fever and chills.  HENT: Positive for congestion, hearing loss, postnasal drip, rhinorrhea and sinus pressure. Negative for ear discharge, ear pain, facial swelling, nosebleeds, sneezing and tinnitus.   Eyes: Negative.   Respiratory: Positive for cough and shortness of breath. Negative for wheezing.   Cardiovascular: Positive for palpitations. Negative for chest pain and leg swelling.  Gastrointestinal: Negative for nausea, abdominal pain, diarrhea, blood in stool and abdominal distention.       Hx erosive  gastritis, HH, Barrett's esophagus. Persistent, uncomfortable lump in her right groin. Ventral hernia.  Endocrine: Negative.   Genitourinary:       Nocturia x 3. Vaginal discomfort. Uses hormone cream from GYN, Dr. Nori Riis  Musculoskeletal: Positive for arthralgias, back pain, neck pain and neck stiffness. Negative for gait problem, joint swelling and myalgias.  Skin: Negative for pallor, rash and wound.       S/P right herniorraphy. Right index finger has onycholysis distally and tenderness.  Neurological: Positive for dizziness, numbness and headaches. Negative for tremors, seizures, syncope and speech difficulty.  Hematological: Negative.   Psychiatric/Behavioral: Positive for dysphoric mood. The patient is nervous/anxious.     Filed Vitals:   09/19/13 1607  BP: 130/82  Pulse: 55  Temp: 97.1 F (36.2 C)  TempSrc: Oral  Weight: 137 lb (62.143 kg)  SpO2: 98%   Body mass index is 20.84 kg/(m^2).  Physical Exam  Constitutional:  Frail, thin body. Losing weight.  HENT:  Right Ear: External ear normal.  Left Ear: External ear normal.  Mouth/Throat: Oropharynx is clear and moist.  Nasal polyp  Eyes:  Corrective lensees  Neck: No JVD present. No tracheal deviation present. No thyromegaly present.  stiff  Cardiovascular: Normal rate and regular rhythm.  Exam reveals no gallop and no friction rub.   No murmur heard. AICD in left upper chest. Raynaud's disease.  Pulmonary/Chest: Effort normal. No respiratory distress. She has no wheezes. She has rales. She exhibits no tenderness.  Abdominal: Soft. Bowel sounds are normal. She exhibits no distension and no mass. There is no tenderness.  Right inguinal area has scar from surgery.  Small lymph node in left groin. Midline incisional hernia that is small and about 3" below the navel.  Musculoskeletal: Normal range of motion. She exhibits no edema.  Tender right knee.   Lymphadenopathy:    She has no cervical adenopathy.    Neurological:  Reduced vibratory sensation in both feet.  Skin: No rash noted. No erythema. No pallor.  Onycholysis of right index fingeer. Tender to palpation.  Psychiatric:  Anxious. Depressed affect. Low energy.     Labs reviewed: Lab on 08/06/2013  Component Date Value Ref Range Status  . TSH 08/06/2013 3.290  0.450 - 4.500 uIU/mL Final  . Glucose 08/06/2013 78  65 - 99 mg/dL Final  . BUN 08/06/2013 12  8 - 27 mg/dL Final  . Creatinine, Ser 08/06/2013 0.85  0.57 - 1.00 mg/dL Final  . GFR calc non Af Amer 08/06/2013 66  >59 mL/min/1.73 Final  . GFR calc Af Amer 08/06/2013 76  >59 mL/min/1.73 Final  . BUN/Creatinine Ratio 08/06/2013 14  11 - 26 Final  . Sodium 08/06/2013 139  134 -  144 mmol/L Final  . Potassium 08/06/2013 4.1  3.5 - 5.2 mmol/L Final  . Chloride 08/06/2013 101  97 - 108 mmol/L Final  . CO2 08/06/2013 24  18 - 29 mmol/L Final  . Calcium 08/06/2013 9.4  8.7 - 10.3 mg/dL Final  . Total Protein 08/06/2013 6.5  6.0 - 8.5 g/dL Final  . Albumin 08/06/2013 4.4  3.5 - 4.8 g/dL Final  . Globulin, Total 08/06/2013 2.1  1.5 - 4.5 g/dL Final  . Albumin/Globulin Ratio 08/06/2013 2.1  1.1 - 2.5 Final  . Total Bilirubin 08/06/2013 0.5  0.0 - 1.2 mg/dL Final  . Alkaline Phosphatase 08/06/2013 82  39 - 117 IU/L Final  . AST 08/06/2013 23  0 - 40 IU/L Final  . ALT 08/06/2013 16  0 - 32 IU/L Final  . Digoxin Level 08/06/2013 <0.2* 0.9 - 2.0 ng/mL Final   Comment: Concentrations above 2.0 ng/mL are generally considered                          toxic. Some overlap of toxic and non-toxic values have                          been reported.                                                      Detection Limit = 0.2 ng/mL  . Specific Gravity, UA 08/06/2013 CANCELED   Final-Edited   Comment: Please refer to the following specimen for additional lab results.                          specimen [id number]                                                    Result canceled by the  ancillary  . pH, UA 08/06/2013 CANCELED   Final-Edited   Comment: Test not performed                                                    Result canceled by the ancillary  . Protein, UA 08/06/2013 CANCELED   Final-Edited   Comment: Test not performed                                                    Result canceled by the ancillary  . Glucose, UA 08/06/2013 CANCELED   Final-Edited   Comment: Test not performed                                                    Result canceled by the ancillary  .  Ketones, UA 08/06/2013 CANCELED   Final-Edited   Comment: Test not performed                                                    Result canceled by the ancillary  . Urine Culture, Routine 08/06/2013 Final report   Final  . Result 1 08/06/2013 Comment   Final   Comment: Mixed urogenital flora                          50,000-100,000 colony forming units per mL  . specimen status report 08/06/2013 Comment   Final   Comment: Written Authorization                          Written Authorization                          Written Authorization Received.                          Authorization received from original request 08-07-2013                          Logged by [employer]                          There is no longer any sample available                          for the following requested testing.                          Test # [id number] Urinalysis, Routine      Assessment/Plan  1. Incisional hernia without mention of obstruction or gangrene Proceed with surgery as scheduled  2. Unspecified chronic bronchitis Currently quiescent.  3. Other malaise and fatigue uncertain etiology. Will reevaluate after surgeery  4. Depressive disorder, not elsewhere classified Start desvenlafaxine  5. Generalized anxiety disorder Start antidepressant as recommended.

## 2013-09-20 ENCOUNTER — Encounter (HOSPITAL_COMMUNITY): Payer: Self-pay | Admitting: Pharmacy Technician

## 2013-09-20 NOTE — Telephone Encounter (Signed)
Close Encounter 

## 2013-09-21 ENCOUNTER — Telehealth: Payer: Self-pay | Admitting: Internal Medicine

## 2013-09-21 ENCOUNTER — Encounter (HOSPITAL_COMMUNITY)
Admission: RE | Admit: 2013-09-21 | Discharge: 2013-09-21 | Disposition: A | Discharge status: Home / Self Care | Payer: Medicare Other | Source: Ambulatory Visit | Attending: General Surgery | Admitting: General Surgery

## 2013-09-21 ENCOUNTER — Encounter (HOSPITAL_COMMUNITY): Payer: Self-pay

## 2013-09-21 DIAGNOSIS — Z01812 Encounter for preprocedural laboratory examination: Secondary | ICD-10-CM | POA: Insufficient documentation

## 2013-09-21 DIAGNOSIS — Z01818 Encounter for other preprocedural examination: Secondary | ICD-10-CM | POA: Diagnosis not present

## 2013-09-21 LAB — COMPREHENSIVE METABOLIC PANEL
ALBUMIN: 3.8 g/dL (ref 3.5–5.2)
ALK PHOS: 92 U/L (ref 39–117)
ALT: 16 U/L (ref 0–35)
AST: 23 U/L (ref 0–37)
Anion gap: 10 (ref 5–15)
BILIRUBIN TOTAL: 0.2 mg/dL — AB (ref 0.3–1.2)
BUN: 17 mg/dL (ref 6–23)
CHLORIDE: 100 meq/L (ref 96–112)
CO2: 26 mEq/L (ref 19–32)
Calcium: 9.5 mg/dL (ref 8.4–10.5)
Creatinine, Ser: 0.71 mg/dL (ref 0.50–1.10)
GFR calc non Af Amer: 80 mL/min — ABNORMAL LOW (ref >90)
GLUCOSE: 82 mg/dL (ref 70–99)
POTASSIUM: 4.8 meq/L (ref 3.7–5.3)
Sodium: 136 mEq/L — ABNORMAL LOW (ref 137–147)
Total Protein: 6.9 g/dL (ref 6.0–8.3)

## 2013-09-21 LAB — CBC WITH DIFFERENTIAL/PLATELET
Basophils Absolute: 0 K/uL (ref 0.0–0.1)
Basophils Relative: 0 % (ref 0–1)
Eosinophils Absolute: 0.2 K/uL (ref 0.0–0.7)
Eosinophils Relative: 4 % (ref 0–5)
HCT: 39.1 % (ref 36.0–46.0)
Hemoglobin: 13.3 g/dL (ref 12.0–15.0)
Lymphocytes Relative: 25 % (ref 12–46)
Lymphs Abs: 1.5 K/uL (ref 0.7–4.0)
MCH: 29.8 pg (ref 26.0–34.0)
MCHC: 34 g/dL (ref 30.0–36.0)
MCV: 87.7 fL (ref 78.0–100.0)
Monocytes Absolute: 0.7 K/uL (ref 0.1–1.0)
Monocytes Relative: 11 % (ref 3–12)
Neutro Abs: 3.6 K/uL (ref 1.7–7.7)
Neutrophils Relative %: 60 % (ref 43–77)
Platelets: 200 K/uL (ref 150–400)
RBC: 4.46 MIL/uL (ref 3.87–5.11)
RDW: 13.8 % (ref 11.5–15.5)
WBC: 6 K/uL (ref 4.0–10.5)

## 2013-09-21 NOTE — Pre-Procedure Instructions (Addendum)
09-21-13 EKG/ CXR 10'14 Epic. Pt. States will have Pacemaker check Dr. Beckie Salts on 09-24-13,previous 04-10-13.  09-28-13 1225 Patient called to PST to say surgery time is changed to 1215 PM. Informed last Clear Liquids would be 56midnight to 0600 AM 10-01-13, then nothing, except sips water for meds that AM. W. Draxton Luu,RN

## 2013-09-21 NOTE — Patient Instructions (Addendum)
20 [name]  09/21/2013   Your procedure is scheduled on:  7-27 -2015 Monday at 2:57 PM  Enter through [facility] Entrance and follow signs to [facility]. Arrive at     1:00/ PM.  Call this number if you have problems the morning of surgery: [phone]  Or Presurgical Testing [phone](Matteo Banke) For Living Will and/or Health Care Power Attorney Forms: please provide copy for your medical record,may bring AM of surgery(Forms should be already notarized -we do not provide this service).(7-17- 15 Yes/ Will bring copies of Living Will/ HCPOA forms to place with chart.    Do not eat food:After Midnight.  May have clear liquids:up to 6 Hours before arrival. Nothing after : 0900 AM.  Clear liquids include soda, tea, black coffee, apple or grape juice, broth.  Take these medicines the morning of surgery with A SIP OF WATER: Levothyroxine. Metoprolol. Aciphex.   Do not wear jewelry, make-up or nail polish.  Do not wear lotions, powders, or perfumes. You may wear deodorant.  Do not shave 48 hours(2 days) prior to first CHG shower(legs and under arms).(Shaving face and neck okay.)  Do not bring valuables to the hospital.(Hospital is not responsible for lost valuables).  Contacts, dentures or removable bridgework, body piercing, hair pins may not be worn into surgery.  Leave suitcase in the car. After surgery it may be brought to your room.  For patients admitted to the hospital, checkout time is 11:00 AM the day of discharge.(Restricted visitors-Any Persons displaying flu-like symptoms or illness).    Patients discharged the day of surgery will not be allowed to drive home. Must have responsible person with you x 24 hours once discharged.  Name and phone number of your driver: [name], significant other- [phone] home  Special Instructions: CHG(Chlorhedine 4%-"Hibiclens","Betasept","Aplicare") Shower Use Special Wash: see special instructions.(avoid face and  genitals)          ______________________    [facility] - Preparing for Surgery Before surgery, you can play an important role.  Because skin is not sterile, your skin needs to be as free of germs as possible.  You can reduce the number of germs on your skin by washing with CHG (chlorahexidine gluconate) soap before surgery.  CHG is an antiseptic cleaner which kills germs and bonds with the skin to continue killing germs even after washing. Please DO NOT use if you have an allergy to CHG or antibacterial soaps.  If your skin becomes reddened/irritated stop using the CHG and inform your nurse when you arrive at Short Stay. Do not shave (including legs and underarms) for at least 48 hours prior to the first CHG shower.  You may shave your face/neck. Please follow these instructions carefully:  1.  Shower with CHG Soap the night before surgery and the  morning of Surgery.  2.  If you choose to wash your hair, wash your hair first as usual with your  normal  shampoo.  3.  After you shampoo, rinse your hair and body thoroughly to remove the  shampoo.                           4.  Use CHG as you would any other liquid soap.  You can apply chg directly  to the skin and wash                       Gently  with a scrungie or clean washcloth.  5.  Apply the CHG Soap to your body ONLY FROM THE NECK DOWN.   Do not use on face/ open                           Wound or open sores. Avoid contact with eyes, ears mouth and genitals (private parts).                       Wash face,  Genitals (private parts) with your normal soap.             6.  Wash thoroughly, paying special attention to the area where your surgery  will be performed.  7.  Thoroughly rinse your body with warm water from the neck down.  8.  DO NOT shower/wash with your normal soap after using and rinsing off  the CHG Soap.                9.  Pat yourself dry with a clean towel.   CLEAR LIQUID DIET   Foods Allowed                                                                      Foods Excluded  Coffee and tea, regular and decaf                             liquids that you cannot  Plain Jell-O in any flavor                                             see through such as: Fruit ices (not with fruit pulp)                                     milk, soups, orange juice  Iced Popsicles                                    All solid food Carbonated beverages, regular and diet                                    Cranberry, grape and apple juices Sports drinks like Gatorade Lightly seasoned clear broth or consume(fat free) Sugar, honey syrup  Sample Menu Breakfast                                Lunch                                     Supper Cranberry juice  Beef broth                            Chicken broth Jell-O                                     Grape juice                           Apple juice Coffee or tea                        Jell-O                                      Popsicle                                                Coffee or tea                        Coffee or tea  _____________________________________________________________________             10.  Wear clean pajamas.            11.  Place clean sheets on your bed the night of your first shower and do not  sleep with pets. Day of Surgery : Do not apply any lotions/deodorants the morning of surgery.  Please wear clean clothes to the hospital/surgery center.  FAILURE TO FOLLOW THESE INSTRUCTIONS MAY RESULT IN THE CANCELLATION OF YOUR SURGERY PATIENT SIGNATURE_________________________________  NURSE SIGNATURE__________________________________  ________________________________________________________________________

## 2013-09-21 NOTE — Telephone Encounter (Signed)
Patient states that she is having hernia repair surgery on July 26th and she is worried that she will not feel well enough to make her August 3rd appointment for pacermaker check.  She is wanting to reschedule prior to her surgery if possible. [facility] stated that she can come in this Monday, July 20th at 1:45 pm. Patient verbalized agreement and appreciation. Appointment rescheduled.

## 2013-09-21 NOTE — Telephone Encounter (Signed)
New message           Pt would like to know if she should still have a pacemaker check on 8/3. Pt is having hernia surgery on 7/26. Should she come into the office before surgery?

## 2013-09-24 ENCOUNTER — Ambulatory Visit (INDEPENDENT_AMBULATORY_CARE_PROVIDER_SITE_OTHER): Payer: Medicare Other | Admitting: *Deleted

## 2013-09-24 DIAGNOSIS — I428 Other cardiomyopathies: Secondary | ICD-10-CM

## 2013-09-24 DIAGNOSIS — I5022 Chronic systolic (congestive) heart failure: Secondary | ICD-10-CM

## 2013-09-24 LAB — MDC_IDC_ENUM_SESS_TYPE_INCLINIC
Date Time Interrogation Session: 20150720040000
Lead Channel Impedance Value: 440 Ohm
Lead Channel Impedance Value: 535 Ohm
Lead Channel Pacing Threshold Amplitude: 1.4 V
Lead Channel Pacing Threshold Pulse Width: 0.4 ms
Lead Channel Pacing Threshold Pulse Width: 1 ms
Lead Channel Sensing Intrinsic Amplitude: 2.5 mV
Lead Channel Sensing Intrinsic Amplitude: 7.2 mV
Lead Channel Setting Pacing Amplitude: 2.4 V
Lead Channel Setting Pacing Amplitude: 3.2 V
Lead Channel Setting Pacing Pulse Width: 0.8 ms
Lead Channel Setting Pacing Pulse Width: 1 ms
Lead Channel Setting Sensing Sensitivity: 2.5 mV
MDC IDC MSMT LEADCHNL LV IMPEDANCE VALUE: 651 Ohm
MDC IDC MSMT LEADCHNL RA PACING THRESHOLD AMPLITUDE: 0.8 V
MDC IDC MSMT LEADCHNL RV PACING THRESHOLD AMPLITUDE: 2.2 V
MDC IDC MSMT LEADCHNL RV PACING THRESHOLD PULSEWIDTH: 0.8 ms
MDC IDC MSMT LEADCHNL RV SENSING INTR AMPL: 15.4 mV
MDC IDC PG SERIAL: 100543
MDC IDC SET LEADCHNL LV SENSING SENSITIVITY: 2.5 mV
MDC IDC SET LEADCHNL RA PACING AMPLITUDE: 2 V
Zone Setting Detection Interval: 375 ms

## 2013-09-25 NOTE — Progress Notes (Signed)
CRT-P device check in clinic. Normal device function. Thresholds, sensing, impedance consistent with previous measurements. Histograms appropriate for patient and level of activity. No mode switches or ventricular high rate episodes. Patient bi-ventricularly pacing 99% of the time. Device programmed with appropriate safety margins---changed LV output from 2.2 to 2.4V. Estimated longevity 6.46yrs. ROV w/ Dr. Lovena Le in 10mo.

## 2013-10-01 ENCOUNTER — Ambulatory Visit (HOSPITAL_COMMUNITY): Payer: Medicare Other | Admitting: Certified Registered Nurse Anesthetist

## 2013-10-01 ENCOUNTER — Encounter (HOSPITAL_COMMUNITY): Payer: Medicare Other | Admitting: Certified Registered Nurse Anesthetist

## 2013-10-01 ENCOUNTER — Encounter (HOSPITAL_COMMUNITY): Payer: Self-pay | Admitting: *Deleted

## 2013-10-01 ENCOUNTER — Encounter (HOSPITAL_COMMUNITY)
Admission: RE | Disposition: A | Discharge status: Home / Self Care | Payer: Self-pay | Source: Ambulatory Visit | Attending: General Surgery

## 2013-10-01 ENCOUNTER — Observation Stay (HOSPITAL_COMMUNITY)
Admission: RE | Admit: 2013-10-01 | Discharge: 2013-10-04 | Disposition: A | Discharge status: Home / Self Care | Payer: Medicare Other | Source: Ambulatory Visit | Attending: General Surgery | Admitting: General Surgery

## 2013-10-01 DIAGNOSIS — K432 Incisional hernia without obstruction or gangrene: Principal | ICD-10-CM | POA: Diagnosis present

## 2013-10-01 DIAGNOSIS — E785 Hyperlipidemia, unspecified: Secondary | ICD-10-CM | POA: Diagnosis not present

## 2013-10-01 DIAGNOSIS — Z87891 Personal history of nicotine dependence: Secondary | ICD-10-CM | POA: Insufficient documentation

## 2013-10-01 DIAGNOSIS — J4489 Other specified chronic obstructive pulmonary disease: Secondary | ICD-10-CM | POA: Diagnosis not present

## 2013-10-01 DIAGNOSIS — R1110 Vomiting, unspecified: Secondary | ICD-10-CM | POA: Diagnosis not present

## 2013-10-01 DIAGNOSIS — K660 Peritoneal adhesions (postprocedural) (postinfection): Secondary | ICD-10-CM | POA: Insufficient documentation

## 2013-10-01 DIAGNOSIS — I739 Peripheral vascular disease, unspecified: Secondary | ICD-10-CM | POA: Diagnosis not present

## 2013-10-01 DIAGNOSIS — F3289 Other specified depressive episodes: Secondary | ICD-10-CM | POA: Diagnosis not present

## 2013-10-01 DIAGNOSIS — K279 Peptic ulcer, site unspecified, unspecified as acute or chronic, without hemorrhage or perforation: Secondary | ICD-10-CM | POA: Diagnosis not present

## 2013-10-01 DIAGNOSIS — I10 Essential (primary) hypertension: Secondary | ICD-10-CM | POA: Insufficient documentation

## 2013-10-01 DIAGNOSIS — E039 Hypothyroidism, unspecified: Secondary | ICD-10-CM | POA: Diagnosis not present

## 2013-10-01 DIAGNOSIS — Z95810 Presence of automatic (implantable) cardiac defibrillator: Secondary | ICD-10-CM | POA: Insufficient documentation

## 2013-10-01 DIAGNOSIS — I509 Heart failure, unspecified: Secondary | ICD-10-CM | POA: Insufficient documentation

## 2013-10-01 DIAGNOSIS — G4730 Sleep apnea, unspecified: Secondary | ICD-10-CM | POA: Diagnosis not present

## 2013-10-01 DIAGNOSIS — K219 Gastro-esophageal reflux disease without esophagitis: Secondary | ICD-10-CM | POA: Insufficient documentation

## 2013-10-01 DIAGNOSIS — Z85820 Personal history of malignant melanoma of skin: Secondary | ICD-10-CM | POA: Diagnosis not present

## 2013-10-01 DIAGNOSIS — Z79899 Other long term (current) drug therapy: Secondary | ICD-10-CM | POA: Diagnosis not present

## 2013-10-01 DIAGNOSIS — F329 Major depressive disorder, single episode, unspecified: Secondary | ICD-10-CM | POA: Diagnosis not present

## 2013-10-01 DIAGNOSIS — F411 Generalized anxiety disorder: Secondary | ICD-10-CM | POA: Insufficient documentation

## 2013-10-01 DIAGNOSIS — J449 Chronic obstructive pulmonary disease, unspecified: Secondary | ICD-10-CM | POA: Diagnosis not present

## 2013-10-01 DIAGNOSIS — G2581 Restless legs syndrome: Secondary | ICD-10-CM | POA: Diagnosis not present

## 2013-10-01 DIAGNOSIS — I428 Other cardiomyopathies: Secondary | ICD-10-CM | POA: Diagnosis not present

## 2013-10-01 DIAGNOSIS — I5022 Chronic systolic (congestive) heart failure: Secondary | ICD-10-CM

## 2013-10-01 HISTORY — PX: INSERTION OF MESH: SHX5868

## 2013-10-01 HISTORY — PX: INCISIONAL HERNIA REPAIR: SHX193

## 2013-10-01 SURGERY — REPAIR, HERNIA, INCISIONAL, LAPAROSCOPIC
Anesthesia: General | Site: Abdomen

## 2013-10-01 MED ORDER — ONDANSETRON HCL 4 MG/2ML IJ SOLN
INTRAMUSCULAR | Status: DC | PRN
  Administered 2013-10-01: 4 mg via INTRAVENOUS

## 2013-10-01 MED ORDER — MIDAZOLAM HCL 5 MG/5ML IJ SOLN
INTRAMUSCULAR | Status: DC | PRN
  Administered 2013-10-01: 2 mg via INTRAVENOUS

## 2013-10-01 MED ORDER — MIDAZOLAM HCL 2 MG/2ML IJ SOLN
INTRAMUSCULAR | Status: AC
  Filled 2013-10-01: qty 2

## 2013-10-01 MED ORDER — DEXAMETHASONE SODIUM PHOSPHATE 10 MG/ML IJ SOLN
INTRAMUSCULAR | Status: AC
  Filled 2013-10-01: qty 1

## 2013-10-01 MED ORDER — LACTATED RINGERS IV SOLN
INTRAVENOUS | Status: DC
  Administered 2013-10-01: 1000 mL via INTRAVENOUS

## 2013-10-01 MED ORDER — ONDANSETRON HCL 4 MG/2ML IJ SOLN
INTRAMUSCULAR | Status: AC
  Filled 2013-10-01: qty 2

## 2013-10-01 MED ORDER — ONDANSETRON HCL 4 MG/2ML IJ SOLN
4.0000 mg | Freq: Four times a day (QID) | INTRAMUSCULAR | Status: DC | PRN
Start: 2013-10-01 — End: 2013-10-04

## 2013-10-01 MED ORDER — HYDROMORPHONE HCL PF 1 MG/ML IJ SOLN
INTRAMUSCULAR | Status: AC
  Filled 2013-10-01: qty 1

## 2013-10-01 MED ORDER — GLYCOPYRROLATE 0.2 MG/ML IJ SOLN
INTRAMUSCULAR | Status: DC | PRN
  Administered 2013-10-01: 0.6 mg via INTRAVENOUS

## 2013-10-01 MED ORDER — NEOSTIGMINE METHYLSULFATE 10 MG/10ML IV SOLN
INTRAVENOUS | Status: DC | PRN
  Administered 2013-10-01: 5 mg via INTRAVENOUS

## 2013-10-01 MED ORDER — BUPIVACAINE-EPINEPHRINE 0.25% -1:200000 IJ SOLN
INTRAMUSCULAR | Status: DC | PRN
  Administered 2013-10-01: 50 mL

## 2013-10-01 MED ORDER — DEXAMETHASONE SODIUM PHOSPHATE 10 MG/ML IJ SOLN
INTRAMUSCULAR | Status: DC | PRN
  Administered 2013-10-01: 10 mg via INTRAVENOUS

## 2013-10-01 MED ORDER — IBUPROFEN 200 MG PO TABS
600.0000 mg | ORAL_TABLET | Freq: Four times a day (QID) | ORAL | Status: DC | PRN
  Filled 2013-10-01: qty 3

## 2013-10-01 MED ORDER — NEOSTIGMINE METHYLSULFATE 10 MG/10ML IV SOLN
INTRAVENOUS | Status: AC
  Filled 2013-10-01: qty 1

## 2013-10-01 MED ORDER — MEPERIDINE HCL 50 MG/ML IJ SOLN
6.2500 mg | INTRAMUSCULAR | Status: DC | PRN
Start: 2013-10-01 — End: 2013-10-01

## 2013-10-01 MED ORDER — PROMETHAZINE HCL 25 MG/ML IJ SOLN: 6.2500 mg | INTRAMUSCULAR | Status: DC | PRN

## 2013-10-01 MED ORDER — HYPROMELLOSE (GONIOSCOPIC) 2.5 % OP SOLN
1.0000 [drp] | Freq: Two times a day (BID) | OPHTHALMIC | Status: DC | PRN
  Filled 2013-10-01: qty 15

## 2013-10-01 MED ORDER — FENTANYL CITRATE 0.05 MG/ML IJ SOLN
INTRAMUSCULAR | Status: AC
  Filled 2013-10-01: qty 5

## 2013-10-01 MED ORDER — TRANDOLAPRIL 2 MG PO TABS
2.0000 mg | ORAL_TABLET | Freq: Two times a day (BID) | ORAL | Status: DC
  Administered 2013-10-01 – 2013-10-04 (×6): 2 mg via ORAL
  Filled 2013-10-01 (×7): qty 1

## 2013-10-01 MED ORDER — HYDROMORPHONE HCL PF 1 MG/ML IJ SOLN
0.2500 mg | INTRAMUSCULAR | Status: DC | PRN
  Administered 2013-10-01 (×2): 0.5 mg via INTRAVENOUS

## 2013-10-01 MED ORDER — GLYCOPYRROLATE 0.2 MG/ML IJ SOLN
INTRAMUSCULAR | Status: AC
  Filled 2013-10-01: qty 3

## 2013-10-01 MED ORDER — PROPOFOL 10 MG/ML IV BOLUS
INTRAVENOUS | Status: DC | PRN
  Administered 2013-10-01: 130 mg via INTRAVENOUS

## 2013-10-01 MED ORDER — FENTANYL CITRATE 0.05 MG/ML IJ SOLN
INTRAMUSCULAR | Status: DC | PRN
  Administered 2013-10-01 (×3): 50 ug via INTRAVENOUS
  Administered 2013-10-01: 100 ug via INTRAVENOUS

## 2013-10-01 MED ORDER — KCL IN DEXTROSE-NACL 20-5-0.45 MEQ/L-%-% IV SOLN
INTRAVENOUS | Status: DC
  Administered 2013-10-01 – 2013-10-03 (×3): via INTRAVENOUS
  Filled 2013-10-01 (×5): qty 1000

## 2013-10-01 MED ORDER — CHLORHEXIDINE GLUCONATE 4 % EX LIQD: 1.0000 "application " | Freq: Once | CUTANEOUS | Status: DC

## 2013-10-01 MED ORDER — HEPARIN SODIUM (PORCINE) 5000 UNIT/ML IJ SOLN
5000.0000 [IU] | Freq: Three times a day (TID) | INTRAMUSCULAR | Status: DC
  Administered 2013-10-02 – 2013-10-03 (×5): 5000 [IU] via SUBCUTANEOUS
  Filled 2013-10-01 (×10): qty 1

## 2013-10-01 MED ORDER — ROCURONIUM BROMIDE 100 MG/10ML IV SOLN
INTRAVENOUS | Status: AC
  Filled 2013-10-01: qty 1

## 2013-10-01 MED ORDER — LIDOCAINE HCL (CARDIAC) 20 MG/ML IV SOLN
INTRAVENOUS | Status: AC
  Filled 2013-10-01: qty 5

## 2013-10-01 MED ORDER — ONDANSETRON HCL 4 MG PO TABS: 4.0000 mg | ORAL_TABLET | Freq: Four times a day (QID) | ORAL | Status: DC | PRN

## 2013-10-01 MED ORDER — ACETAMINOPHEN 500 MG PO TABS
1000.0000 mg | ORAL_TABLET | Freq: Four times a day (QID) | ORAL | Status: AC
  Administered 2013-10-01 – 2013-10-02 (×4): 1000 mg via ORAL
  Filled 2013-10-01 (×5): qty 2

## 2013-10-01 MED ORDER — LACTATED RINGERS IR SOLN
Status: DC | PRN
  Administered 2013-10-01: 1000 mL

## 2013-10-01 MED ORDER — VANCOMYCIN HCL IN DEXTROSE 1-5 GM/200ML-% IV SOLN
INTRAVENOUS | Status: AC
  Filled 2013-10-01: qty 200

## 2013-10-01 MED ORDER — LEVOTHYROXINE SODIUM 112 MCG PO TABS
112.0000 ug | ORAL_TABLET | Freq: Every day | ORAL | Status: DC
  Administered 2013-10-02 – 2013-10-04 (×3): 112 ug via ORAL
  Filled 2013-10-01 (×4): qty 1

## 2013-10-01 MED ORDER — FENTANYL CITRATE 0.05 MG/ML IJ SOLN: 12.5000 ug | INTRAMUSCULAR | Status: DC | PRN

## 2013-10-01 MED ORDER — PROPOFOL 10 MG/ML IV BOLUS
INTRAVENOUS | Status: AC
  Filled 2013-10-01: qty 20

## 2013-10-01 MED ORDER — BUPIVACAINE-EPINEPHRINE 0.25% -1:200000 IJ SOLN
INTRAMUSCULAR | Status: AC
  Filled 2013-10-01: qty 1

## 2013-10-01 MED ORDER — PANTOPRAZOLE SODIUM 40 MG PO TBEC
40.0000 mg | DELAYED_RELEASE_TABLET | Freq: Every day | ORAL | Status: DC
  Administered 2013-10-02 – 2013-10-04 (×3): 40 mg via ORAL
  Filled 2013-10-01 (×4): qty 1

## 2013-10-01 MED ORDER — ROCURONIUM BROMIDE 100 MG/10ML IV SOLN
INTRAVENOUS | Status: DC | PRN
  Administered 2013-10-01: 10 mg via INTRAVENOUS
  Administered 2013-10-01: 35 mg via INTRAVENOUS
  Administered 2013-10-01: 10 mg via INTRAVENOUS

## 2013-10-01 MED ORDER — METOPROLOL TARTRATE 12.5 MG HALF TABLET
12.5000 mg | ORAL_TABLET | Freq: Every day | ORAL | Status: DC
  Administered 2013-10-02 – 2013-10-04 (×3): 12.5 mg via ORAL
  Filled 2013-10-01 (×3): qty 1

## 2013-10-01 MED ORDER — OXYCODONE HCL 5 MG PO TABS: 5.0000 mg | ORAL_TABLET | Freq: Once | ORAL | Status: DC | PRN

## 2013-10-01 MED ORDER — OXYCODONE HCL 5 MG PO TABS
5.0000 mg | ORAL_TABLET | ORAL | Status: DC | PRN
  Administered 2013-10-02: 5 mg via ORAL
  Administered 2013-10-03: 10 mg via ORAL
  Filled 2013-10-01: qty 1
  Filled 2013-10-01: qty 2

## 2013-10-01 MED ORDER — OXYCODONE HCL 5 MG/5ML PO SOLN
5.0000 mg | Freq: Once | ORAL | Status: DC | PRN
  Filled 2013-10-01: qty 5

## 2013-10-01 MED ORDER — VANCOMYCIN HCL IN DEXTROSE 1-5 GM/200ML-% IV SOLN
1000.0000 mg | INTRAVENOUS | Status: AC
  Administered 2013-10-01: 1000 mg via INTRAVENOUS

## 2013-10-01 MED ORDER — LIDOCAINE HCL (CARDIAC) 20 MG/ML IV SOLN
INTRAVENOUS | Status: DC | PRN
  Administered 2013-10-01: 100 mg via INTRAVENOUS

## 2013-10-01 MED ORDER — SUCCINYLCHOLINE CHLORIDE 20 MG/ML IJ SOLN
INTRAMUSCULAR | Status: DC | PRN
  Administered 2013-10-01: 100 mg via INTRAVENOUS

## 2013-10-01 SURGICAL SUPPLY — 44 items
BANDAGE ADH SHEER 1  50/CT (GAUZE/BANDAGES/DRESSINGS) IMPLANT
BENZOIN TINCTURE PRP APPL 2/3 (GAUZE/BANDAGES/DRESSINGS) ×2 IMPLANT
BINDER ABDOMINAL 12 ML 46-62 (SOFTGOODS) ×2 IMPLANT
CANISTER SUCTION 2500CC (MISCELLANEOUS) ×2 IMPLANT
CHLORAPREP W/TINT 26ML (MISCELLANEOUS) ×2 IMPLANT
DECANTER SPIKE VIAL GLASS SM (MISCELLANEOUS) ×2 IMPLANT
DEVICE RELIATACK FIXATION (MISCELLANEOUS) ×2 IMPLANT
DEVICE TROCAR PUNCTURE CLOSURE (ENDOMECHANICALS) ×2 IMPLANT
DISSECTOR BLUNT TIP ENDO 5MM (MISCELLANEOUS) IMPLANT
DRAPE LAPAROSCOPIC ABDOMINAL (DRAPES) ×2 IMPLANT
DRAPE UTILITY XL STRL (DRAPES) ×2 IMPLANT
DRSG TEGADERM 2-3/8X2-3/4 SM (GAUZE/BANDAGES/DRESSINGS) IMPLANT
ELECT REM PT RETURN 9FT ADLT (ELECTROSURGICAL) ×2
ELECTRODE REM PT RTRN 9FT ADLT (ELECTROSURGICAL) ×1 IMPLANT
GLOVE BIOGEL M STRL SZ7.5 (GLOVE) ×8 IMPLANT
GLOVE INDICATOR 8.0 STRL GRN (GLOVE) ×2 IMPLANT
GLOVE SURG SIGNA 7.5 PF LTX (GLOVE) ×8 IMPLANT
GOWN STRL REUS W/TWL LRG LVL3 (GOWN DISPOSABLE) ×4 IMPLANT
GOWN STRL REUS W/TWL XL LVL3 (GOWN DISPOSABLE) ×4 IMPLANT
KIT BASIN OR (CUSTOM PROCEDURE TRAY) ×2 IMPLANT
MARKER SKIN DUAL TIP RULER LAB (MISCELLANEOUS) ×2 IMPLANT
MESH VENTRALIGHT ST 6IN CRC (Mesh General) ×2 IMPLANT
NEEDLE INSUFFLATION 14GA 120MM (NEEDLE) IMPLANT
NEEDLE SPNL 22GX3.5 QUINCKE BK (NEEDLE) ×2 IMPLANT
NS IRRIG 1000ML POUR BTL (IV SOLUTION) ×2 IMPLANT
PAIN PUMP ON-Q 400MLX5ML 5IN (MISCELLANEOUS) ×4 IMPLANT
PENCIL BUTTON HOLSTER BLD 10FT (ELECTRODE) ×2 IMPLANT
SCISSORS LAP 5X35 DISP (ENDOMECHANICALS) ×2 IMPLANT
SET IRRIG TUBING LAPAROSCOPIC (IRRIGATION / IRRIGATOR) ×2 IMPLANT
SHEARS HARMONIC ACE PLUS 36CM (ENDOMECHANICALS) IMPLANT
SOLUTION ANTI FOG 6CC (MISCELLANEOUS) ×2 IMPLANT
STRIP CLOSURE SKIN 1/2X4 (GAUZE/BANDAGES/DRESSINGS) ×4 IMPLANT
SUT DEVICE BRAIDED 0X39 (SUTURE) ×2 IMPLANT
SUT MNCRL AB 4-0 PS2 18 (SUTURE) ×2 IMPLANT
SUT NOVA 0 T19/GS 22DT (SUTURE) ×2 IMPLANT
TACKER 5MM HERNIA 3.5CML NAB (ENDOMECHANICALS) IMPLANT
TOWEL OR 17X26 10 PK STRL BLUE (TOWEL DISPOSABLE) ×2 IMPLANT
TRAY FOLEY CATH 14FRSI W/METER (CATHETERS) ×2 IMPLANT
TRAY LAP CHOLE (CUSTOM PROCEDURE TRAY) ×2 IMPLANT
TROCAR BLADELESS OPT 5 75 (ENDOMECHANICALS) ×4 IMPLANT
TROCAR XCEL BLUNT TIP 100MML (ENDOMECHANICALS) ×2 IMPLANT
TROCAR XCEL NON-BLD 11X100MML (ENDOMECHANICALS) ×2 IMPLANT
TROCAR XCEL UNIV SLVE 11M 100M (ENDOMECHANICALS) ×2 IMPLANT
TUBING INSUFFLATION 10FT LAP (TUBING) ×2 IMPLANT

## 2013-10-01 NOTE — Progress Notes (Signed)
PACU note---Dr. [name] notified of results of Pacemaker check and OK to go to telemetry floor; pt will keep foley catheter overnight

## 2013-10-01 NOTE — H&P (Signed)
[name] is an 80 y.o. female.   Chief Complaint: here for surgery HPI: 80 yo WF here for lap ventral incisional hernia repair. Denies any changes since seen in office about 1 month ago. Been seen by PCP and had ICD checked. Denies f/c/n/v/CP. Some cough but non-productive. Thinks hernia may have gotten a little larger.   80 year old Caucasian female sent to our office from Dr. Nyoka [name] because of concerns for ventral hernia. The patient has a complicated abdominal surgical history. She underwent an open repair of a right indirect inguinal hernia with mesh on 09/26/2012 by Dr. Zella [name]. She developed an incarcerated right femoral hernia in October. She underwent groin exploration on October 20 by Dr. Dalbert [name]. She was found to have a strangulated right femoral hernia and underwent a small bowel resection with primary tissue repair. Her ileus never resolved and she was taken back to the operating room on October 30 by Dr. Lilyan [name] where she underwent exploratory laparotomy, small bowel resection as well as primary tissue repair of right femoral hernia. She states she is just very frustrated about all the surgeries she had to endure last year. She states on May 8 she felt a loud noise in her abdomen and since that time her abdomen has been swollen. She initially had epigastric pain and saw her gastroenterologist who thought it was more due to her gastroesophageal reflux disease. The epigastric pain improved and resolved but she continued to have swelling in her lower abdomen. She states that she occasionally feels a hard place in her lower midline. She denies nausea vomiting. She denies fevers or chills. She has a bowel movement daily. She denies any melena or hematochezia. She states her lower abdomen just feels thicker and more swollen.   Past Medical History  Diagnosis Date  . Nonischemic cardiomyopathy     EF initially 20%; Last measurement up to 50% per echo in August of 2012  . CHF (congestive heart  failure)   . LBBB (left bundle branch block)   . Mitral regurgitation   . Melanoma     right arm  . Hypothyroidism   . Erosive gastritis 08/2004  . Hyperplastic polyps of stomach 06/2002  . Barrett's esophagus 04/2008  . GERD (gastroesophageal reflux disease)   . Diverticulitis   . Anxiety disorder   . HTN (hypertension)   . ICD (implantable cardiac defibrillator) in place     BiV/ICD; s/p removal of ICD with insertion of BiV PPM 04/26/12  . Hair thinning   . Unspecified sinusitis (chronic)   . Unspecified chronic bronchitis   . Depressive disorder, not elsewhere classified   . Restless legs syndrome (RLS)   . Chronic airway obstruction, not elsewhere classified   . Benign neoplasm of colon   . Other and unspecified hyperlipidemia   . Unspecified nasal polyp   . Pain in joint, lower leg   . Synovial cyst of popliteal space   . Complication of anesthesia     slow to wake up  . Pacemaker 04/26/2012  . Sleep apnea   . Other and unspecified hyperlipidemia   . Dysthymic disorder   . Palpitations     Past Surgical History  Procedure Laterality Date  . Defibrillator insertion      s/p removal of previously implanted BiV ICD and insertion of a new BiV pacemaker on 04/26/12  . Left arm surgery    . Abdominal hysterectomy  1076    endometriosis  . Right breast lumpectomy  1988  Dr. Marylene [name]  . Appendectomy    . Tonsillectomy    . Right knee surgery      arthroscopy: Dr. Shellia [name]  . Excision of wen  1984    Dr. Parks [name]  . Excise mole of lip  2001    Dr. Larena Sox  . Inguinal hernia repair Right 09/26/2012    Procedure: RIGHT INGUINAL HERNIA REPAIR WITH MESH;  Surgeon: Odis Hollingshead, MD;  Location: [place];  Service: General;  Laterality: Right;  . Insertion of mesh Right 09/26/2012    Procedure: INSERTION OF MESH;  Surgeon: Odis Hollingshead, MD;  Location: [place];  Service: General;  Laterality: Right;  . Inguinal hernia repair Right 12/25/2012    Procedure: explor  right groin, small bowel rescection, tissue repair right femoral hernia;  Surgeon: [name] Hector, MD;  Location: [facility];  Service: General;  Laterality: Right;  . Laparoscopy N/A 01/04/2013    Procedure: Diagnostic Laparoscopy, exploratory laparotomy with small bowel resection, closure of right femoral hernia repair;  Surgeon: [name] Hook, DO;  Location: [facility];  Service: General;  Laterality: N/A;  . Femoral hernia repair  12/25/2012    Dr [name]  . Femoral hernia repair  01/04/2013    Recurrent - Dr. Lilyan [name]    Family History  Problem Relation Age of Onset  . Heart disease Other     maternal side  . Colon cancer Paternal Uncle   . Breast cancer Maternal Aunt   . Diabetes Neg Hx   . Stroke Father    Social History:  reports that she quit smoking about 19 years ago. Her smoking use included Cigarettes. She smoked 0.00 packs per day. She has never used smokeless tobacco. She reports that she does not drink alcohol or use illicit drugs.  Allergies:  Allergies  Allergen Reactions  . Hydrocodone Itching  . Cephalexin Itching and Swelling  . Doxycycline Other (See Comments)    Per pt: unknown  . Ofloxacin Other (See Comments)    Per pt: unknown  . Tape Other (See Comments)    Burns skin.   . Latex Rash    "If pt. Makes contact with or wearing"    Medications Prior to Admission  Medication Sig Dispense Refill  . Ascorbic Acid (VITAMIN C) 1000 MG tablet Take 1,000 mg by mouth daily.      Marland Kitchen aspirin 81 MG tablet Take 81 mg by mouth daily. Takes bedtime daily. Instructed to stop x5 days prior.      . Biotin 5000 MCG TABS Take 1 tablet by mouth daily.      . Calcium Carbonate-Vitamin D (CALCIUM + D PO) Take 2 tablets by mouth daily.       . Cyanocobalamin (VITAMIN B 12 PO) Take 1 tablet by mouth daily.       Marland Kitchen ESTRACE VAGINAL 0.1 MG/GM vaginal cream Place 1 g vaginally 2 (two) times a week.       . furosemide (LASIX) 20 MG tablet Take 20 mg by mouth daily as needed for fluid or  edema.      . hydroxypropyl methylcellulose (ISOPTO TEARS) 2.5 % ophthalmic solution Place 1 drop into both eyes 2 (two) times daily as needed for dry eyes.      Marland Kitchen levothyroxine (SYNTHROID, LEVOTHROID) 112 MCG tablet Take 112 mcg by mouth daily before breakfast.      . Magnesium 250 MG TABS Take 1 tablet by mouth daily.       . metoprolol tartrate (  LOPRESSOR) 25 MG tablet Take 12.5 mg by mouth daily.       . Multiple Vitamin (MULTIVITAMIN WITH MINERALS) TABS Take 1 tablet by mouth daily.      . potassium chloride SA (K-DUR,KLOR-CON) 20 MEQ tablet Take 20 mEq by mouth daily as needed (only with lasix).      . RABEprazole (ACIPHEX) 20 MG tablet Take 20 mg by mouth daily.       . trandolapril (MAVIK) 2 MG tablet Take 2 mg by mouth 2 (two) times daily.      Marland Kitchen acetaminophen (TYLENOL) 500 MG tablet Take 500 mg by mouth every 6 (six) hours as needed for pain. For pain        No results found for this or any previous visit (from the past 48 hour(s)). No results found.  Review of Systems  Constitutional: Negative for weight loss.  HENT: Negative for nosebleeds.   Eyes: Negative for blurred vision.  Respiratory: Positive for cough. Negative for hemoptysis, sputum production and shortness of breath.   Cardiovascular: Negative for chest pain, palpitations, orthopnea and PND.       Denies DOE  Gastrointestinal: Positive for abdominal pain. Negative for nausea, vomiting, blood in stool and melena.  Genitourinary: Negative for dysuria and hematuria.  Musculoskeletal: Negative.   Skin: Negative for itching and rash.  Neurological: Negative for dizziness, focal weakness, seizures, loss of consciousness and headaches.       Denies TIAs, amaurosis fugax  Endo/Heme/Allergies: Does not bruise/bleed easily.  Psychiatric/Behavioral: The patient is nervous/anxious.     Blood pressure 133/63, pulse 55, temperature 97.3 F (36.3 C), temperature source Oral, resp. rate 18, height 5\' 8"  (1.727 m), weight 136 lb  (61.689 kg), SpO2 100.00%. Physical Exam  Vitals reviewed. Constitutional: She is oriented to person, place, and time. She appears well-developed and well-nourished. No distress.  Elderly wf  HENT:  Head: Normocephalic and atraumatic.  Right Ear: External ear normal.  Left Ear: External ear normal.  A little temporal wasting  Eyes: Conjunctivae are normal. No scleral icterus.  Neck: Normal range of motion. Neck supple. No tracheal deviation present. No thyromegaly present.  Cardiovascular: Normal rate and normal heart sounds.   Respiratory: Effort normal and breath sounds normal. No stridor. No respiratory distress. She has no wheezes.    GI: There is no tenderness. There is no rigidity and no guarding. A hernia is present. Hernia confirmed positive in the ventral area.    Musculoskeletal: She exhibits no edema and no tenderness.  Lymphadenopathy:    She has no cervical adenopathy.  Neurological: She is alert and oriented to person, place, and time. She exhibits normal muscle tone.  Skin: Skin is warm and dry. No rash noted. She is not diaphoretic. No erythema. No pallor.  Psychiatric: She has a normal mood and affect. Her behavior is normal. Judgment and thought content normal.     Assessment/Plan Ventral incisional hernia Anxiety/depression H/o CHF H/o ICD  To OR for Lap ventral incisional hernia repair with mesh All questions asked and answered.  scds IV abx  [name]. [name], MD, FACS General, Bariatric, & Minimally Invasive Surgery [facility], [place]   [facility] M 10/01/2013, 12:17 PM

## 2013-10-01 NOTE — Anesthesia Postprocedure Evaluation (Signed)
Anesthesia Post Note  Patient: [name]  Procedure(s) Performed: Procedure(s) (LRB): LAPAROSCOPIC INCISIONAL HERNIA (N/A) INSERTION OF MESH (N/A)  Anesthesia type: General  Patient location: PACU  Post pain: Pain level controlled  Post assessment: Post-op Vital signs reviewed  Last Vitals: BP 130/52  Pulse 55  Temp(Src) 36.7 C (Oral)  Resp 16  Ht 5\' 8"  (1.727 m)  Wt 141 lb 8.6 oz (64.2 kg)  BMI 21.53 kg/m2  SpO2 98%  Post vital signs: Reviewed  Level of consciousness: sedated  PACU course: Pacemaker interrogated due to EKG changes during the OR course. Changes due to native AV conduction that was more rapid than AV delay settings in pacer with A paced or sensed rhythm that became active under anesthesia. Possible cause pneumoperitoneum, increased sympathetic tone due to hypercapnia, etc. Pacemaker functioning correctly according to rep. D/C to tele floor.  Complications: No apparent anesthesia complications

## 2013-10-01 NOTE — Progress Notes (Signed)
Patient admitted from PACU, alert and oriented, denies any pain/distress. Oriented patient to room/unit and reviewed plan of care with patient. Surgical incision over abd clean/dry and intact. Foley inplace draining clear yellow urine. Will continue to assess patient.

## 2013-10-01 NOTE — Transfer of Care (Signed)
Immediate Anesthesia Transfer of Care Note  Patient: [name]  Procedure(s) Performed: Procedure(s) (LRB): LAPAROSCOPIC INCISIONAL HERNIA (N/A) INSERTION OF MESH (N/A)  Patient Location: PACU  Anesthesia Type: General  Level of Consciousness: sedated, patient cooperative and responds to stimulation  Airway & Oxygen Therapy: Patient Spontanous Breathing and Patient connected to face mask oxgen  Post-op Assessment: Report given to PACU RN and Post -op Vital signs reviewed and stable  Post vital signs: Reviewed and stable  Complications: No apparent anesthesia complications

## 2013-10-01 NOTE — Progress Notes (Signed)
PACU note-----Joey,rep from [employer], in to check pacemaker; Dr. Lissa Hoard in also; OK to go to room for continued observation

## 2013-10-01 NOTE — Progress Notes (Signed)
PACU note---on arrival to PACU, pt's heart rate 100's with frequent PVC's and pacer spikes; then heart rate down to 70-80's with pacer spikes and PVC's; Dr. Lissa Hoard in to check pt; monitor also showing this back in OR; EKG done and shown to Dr. Lissa Hoard; [facility] [employer] rep called to check pt's pacemaker; BP 160's/70's; pt denies chest discomfort

## 2013-10-01 NOTE — Anesthesia Preprocedure Evaluation (Addendum)
Anesthesia Evaluation  Patient identified by MRN, date of birth, ID band Patient awake    Reviewed: Allergy & Precautions, H&P , NPO status , Patient's Chart, lab work & pertinent test results, reviewed documented beta blocker date and time   Airway Mallampati: II TM Distance: >3 FB Neck ROM: Full    Dental no notable dental hx.    Pulmonary sleep apnea , COPDformer smoker,  breath sounds clear to auscultation  Pulmonary exam normal       Cardiovascular hypertension, Pt. on medications and Pt. on home beta blockers + Peripheral Vascular Disease and +CHF + dysrhythmias + pacemaker - Cardiac Defibrillator Rhythm:Regular Rate:Normal  Echo 2.2015 - Left ventricle: Systolic function was normal. The estimated ejection fraction was in the range of 55% to 60%. Wall motion was normal; there were no regional wall motion abnormalities. The study is not technically sufficient to allow evaluation of LV diastolic function. The E/e' ratio is >10, suggesting elevated LV filling pressure. - Mitral valve: Mildly thickened leaflets . Not well visualized. Mildto moderate regurgitation. - Left atrium: Severely dilated (43 ml/m2). - Tricuspid valve: Mild regurgitation. - Pulmonary arteries: PA peak pressure: 18mm Hg (S). - Systemic veins: The IVC measures >2.1 cm, but collapses  >50%, suggesting an elevated RA pressure of 8 mmHg. - Pericardium, extracardiac: There was no pericardial   Effusion.   Pacemaker: Bi-Ventricular pacer. ICD removed 2014. A-paced ~ 60% of the time, bi-ventricularly pacing 99% of the time.    Neuro/Psych PSYCHIATRIC DISORDERS Depression negative neurological ROS     GI/Hepatic Neg liver ROS, PUD, GERD-  ,  Endo/Other  Hypothyroidism   Renal/GU negative Renal ROS     Musculoskeletal negative musculoskeletal ROS (+)   Abdominal   Peds  Hematology negative hematology ROS (+)   Anesthesia Other Findings    Reproductive/Obstetrics negative OB ROS                        Anesthesia Physical Anesthesia Plan  ASA: III  Anesthesia Plan: General   Post-op Pain Management:    Induction: Intravenous  Airway Management Planned: Oral ETT  Additional Equipment:   Intra-op Plan:   Post-operative Plan: Extubation in OR  Informed Consent: I have reviewed the patients History and Physical, chart, labs and discussed the procedure including the risks, benefits and alternatives for the proposed anesthesia with the patient or authorized representative who has indicated his/her understanding and acceptance.   Dental advisory given  Plan Discussed with: CRNA  Anesthesia Plan Comments:         Anesthesia Quick Evaluation

## 2013-10-01 NOTE — Op Note (Signed)
[name] [birth year] [id number] 10/01/2013   Laparoscopic Ventral Incisional Hernia Repair Procedure Note  Indications: Symptomatic ventral incisional hernia  Pre-operative Diagnosis: ventral incisional hernia  Post-operative Diagnosis: same; intra-abdominal adhesions  Surgeon: Gayland Curry   Assistants: none  Anesthesia: General endotracheal anesthesia  ASA Class: 3  Procedure Details  The patient was seen in the Holding Room. The risks, benefits, complications, treatment options, and expected outcomes were discussed with the patient. The possibilities of reaction to medication, pulmonary aspiration, perforation of viscus, bleeding, recurrent infection, the need for additional procedures, failure to diagnose a condition, and creating a complication requiring transfusion or operation were discussed with the patient. The patient concurred with the proposed plan, giving informed consent.  The site of surgery properly noted/marked. The patient was taken to the operating room, identified as [name] and the procedure verified as laparoscopic ventral incisional hernia repair with mesh. A Time Out was held and the above information confirmed.    The patient was placed supine.  After establishing general anesthesia, a Foley catheter was placed.  The abdomen was prepped with Chloraprep and draped in standard fashion.  A 5 mm Optiview was used the cannulate the peritoneal cavity in the left upper quadrant below the costal margin.  Pneumoperitoneum was obtained by insufflating CO2, maintaining a maximum pressure of 15 mmHg.  The 5 mm 30-degree laparoscopic was inserted.  There were several loops of small bowel  adhesions to the anterior abdominal wall in and around the hernia defect.  An 12-mm port was placed in the left anterior axillary line at the level of the umbilicus.  Another 5-mm port was placed in the left lower quadrant.  Endoshears without cautery and gentle traction were used to  dissect the bowel adhesions away from the anterior abdominal wall.  We cleared the entire abdominal wall and were able to visualize 1 fascial defect infraumbilically along with some diastasis of lower midline. We used a spinal needle to identify the extent of the hernia defects.  This covered an area of about 3 x3 cms.  We selected a round piece of Bard VentalightST mesh (round 15cm).  We placed 4 stay sutures of 0 Novofil around the edges of the mesh.  The mesh was then rolled up and inserted through the 12 mm port site.  The mesh was then unrolled.  The stay sutures were then pulled up through small stab incisions using the Endo-close device.  This deployed the mesh widely over the fascial defects.  The stay sutures were then tied down.  The Covidien Reliatrack device was then used to tack down the edges of the mesh at 1 cm intervals circumferentially.  We placed a few tacks inside the outer ring of tacks.  We inspected for hemostasis. The defect was well covered along with the diastasis. I inspected the optiview entry site and there was no evidence of bowel injury. The fascial defect at the 12 mm port site was closed with a 0 Vicryl using the Endoclose device.  Pneumoperitoneum was then released as we removed the remainder of the trocars.  The port sites were closed with 4-0 Monocryl.  All of the incisions and stay suture sites were then sealed with Dermabond.  An abdominal binder was placed around the patient's abdomen.  The patient was extubated and brought to the recovery room in stable condition.  All sponge, instrument, and needle counts were correct prior to closure and at the conclusion of the case.   Findings: Type  of repair - mesh  (choices - primary suture, mesh, or component)  Name of mesh - BardVentralight ST  Size of mesh - Round 15cm (6")  Mesh overlap - > 5 cm  Placement of mesh -  beneath fascia and into peritoneal cavity  (choices - beneath fascia and into peritoneal cavity, beneath  fascia but external to peritoneal cavity, between the muscle and fascia, above or external to fascia)   Estimated Blood Loss:  Minimal         Complications:  None; patient tolerated the procedure well.         Disposition: PACU - hemodynamically stable.         Condition: stable  [name]. [name] Pulling, MD, FACS General, Bariatric, & Minimally Invasive Surgery [facility], [place]

## 2013-10-02 ENCOUNTER — Encounter (HOSPITAL_COMMUNITY): Payer: Self-pay | Admitting: General Surgery

## 2013-10-02 DIAGNOSIS — K432 Incisional hernia without obstruction or gangrene: Secondary | ICD-10-CM | POA: Diagnosis not present

## 2013-10-02 LAB — CBC
HEMATOCRIT: 39.1 % (ref 36.0–46.0)
HEMOGLOBIN: 13.2 g/dL (ref 12.0–15.0)
MCH: 29.7 pg (ref 26.0–34.0)
MCHC: 33.8 g/dL (ref 30.0–36.0)
MCV: 88.1 fL (ref 78.0–100.0)
Platelets: 202 10*3/uL (ref 150–400)
RBC: 4.44 MIL/uL (ref 3.87–5.11)
RDW: 13.3 % (ref 11.5–15.5)
WBC: 11.7 10*3/uL — AB (ref 4.0–10.5)

## 2013-10-02 LAB — BASIC METABOLIC PANEL
ANION GAP: 13 (ref 5–15)
BUN: 11 mg/dL (ref 6–23)
CO2: 22 meq/L (ref 19–32)
CREATININE: 0.66 mg/dL (ref 0.50–1.10)
Calcium: 9.1 mg/dL (ref 8.4–10.5)
Chloride: 105 mEq/L (ref 96–112)
GFR calc non Af Amer: 82 mL/min — ABNORMAL LOW (ref >90)
Glucose, Bld: 113 mg/dL — ABNORMAL HIGH (ref 70–99)
POTASSIUM: 4.4 meq/L (ref 3.7–5.3)
SODIUM: 140 meq/L (ref 137–147)

## 2013-10-02 LAB — PHOSPHORUS: PHOSPHORUS: 3.4 mg/dL (ref 2.3–4.6)

## 2013-10-02 LAB — MAGNESIUM: MAGNESIUM: 1.8 mg/dL (ref 1.5–2.5)

## 2013-10-02 NOTE — Progress Notes (Signed)
Utilization review completed.  

## 2013-10-02 NOTE — Care Management Note (Addendum)
    Page 1 of 1   10/04/2013     10:46:28 AM CARE MANAGEMENT NOTE 10/04/2013  Patient:  [name], [name]   Account Number:  [id number]  Date Initiated:  10/02/2013  Documentation initiated by:  [facility]  Subjective/Objective Assessment:   80 Y/O F ADMITTED W/INCISIONAL HERNIA.     Action/Plan:   FROM HOME ALONE.   Anticipated DC Date:  10/04/2013   Anticipated DC Plan:  [place]  CM consult  Patient refused services      Choice offered to / List presented to:  C-1 Patient           Status of service:  Completed, signed off Medicare Important Message given?   (If response is "NO", the following Medicare IM given date fields will be blank) Date Medicare IM given:   Medicare IM given by:   Date Additional Medicare IM given:   Additional Medicare IM given by:    Discharge Disposition:  HOME/SELF CARE  Per UR Regulation:  Reviewed for med. necessity/level of care/duration of stay  If discussed at [place] of Stay Meetings, dates discussed:    Comments:  10/04/13 Noe Pittsley RN,BSN NCM [phone] NO D/C NEEDS OR ORDERS.  10/03/13 Blessen Kimbrough RN,BSN NCM [phone] N/V TODAY.PT-HH.PATIENT DECLINES HHC.NO ANTICIPATED D/C NEEDS.  10/02/13 Fadel Clason RN,BSN NCM [phone] PROVIDED [place] AGENCY LIST AS RESOURCE. POD#1 LAP VENTRAL  HERNIA REPAIR.PT/OT CONS PENDING.AWAIT RECOMMENDATIONS.

## 2013-10-02 NOTE — Progress Notes (Signed)
1 Day Post-Op  Subjective: Sore. No n/v. Tolerated eggs. Pain ok. Hasn't voided since foley removed at 0630. Wants to go home  Objective: Vital signs in last 24 hours: Temp:  [94 F (34.4 C)-98.1 F (36.7 C)] 97.8 F (36.6 C) (07/28 0512) Pulse Rate:  [54-109] 54 (07/28 0512) Resp:  [16-18] 18 (07/28 0512) BP: (130-173)/(52-86) 130/57 mmHg (07/28 0512) SpO2:  [94 %-100 %] 95 % (07/28 0512) Weight:  [136 lb (61.689 kg)-141 lb 8.6 oz (64.2 kg)] 141 lb 8.6 oz (64.2 kg) (07/27 1615) Last BM Date: 09/30/13  Intake/Output from previous day: 07/27 0701 - 07/28 0700 In: 1350 [I.V.:1350] Out: 2575 [Urine:2575] Intake/Output this shift:    Alert, nad, sitting in chair cta b/l Paced Soft, nd, incisions c/d/i; approp TTP. +binder No edema  Lab Results:   Recent Labs  10/02/13 0443  WBC 11.7*  HGB 13.2  HCT 39.1  PLT 202   BMET  Recent Labs  10/02/13 0443  NA 140  K 4.4  CL 105  CO2 22  GLUCOSE 113*  BUN 11  CREATININE 0.66  CALCIUM 9.1   PT/INR No results found for this basename: LABPROT, INR,  in the last 72 hours ABG No results found for this basename: PHART, PCO2, PO2, HCO3,  in the last 72 hours  Studies/Results: No results found.  Anti-infectives: Anti-infectives   Start     Dose/Rate Route Frequency Ordered Stop   10/01/13 0958  vancomycin (VANCOCIN) IVPB 1000 mg/200 mL premix     1,000 mg 200 mL/hr over 60 Minutes Intravenous On call to O.R. 10/01/13 0958 10/01/13 1226      Assessment/Plan: s/p Procedure(s): LAPAROSCOPIC INCISIONAL HERNIA (N/A) INSERTION OF MESH (N/A)  OOB, IS, pulm toilet KVO IVF PT/OT consults since pt lives by herself D/c advil  Cont subcu heparin Await spont void.   [name]. [name], MD, FACS General, Bariatric, & Minimally Invasive Surgery [facility], [place]   LOS: 1 day    Gayland Curry 10/02/2013

## 2013-10-02 NOTE — Progress Notes (Signed)
Received report from [place], RN. Agree with a.m. Assessments with exception of charted assessment.

## 2013-10-03 ENCOUNTER — Encounter (HOSPITAL_COMMUNITY): Payer: Self-pay | Admitting: *Deleted

## 2013-10-03 DIAGNOSIS — K432 Incisional hernia without obstruction or gangrene: Secondary | ICD-10-CM | POA: Diagnosis not present

## 2013-10-03 MED ORDER — OXYCODONE HCL 5 MG PO TABS: 5.0000 mg | ORAL_TABLET | ORAL | Status: DC | PRN

## 2013-10-03 NOTE — Evaluation (Signed)
Physical Therapy One Time Evaluation Patient Details Name: [name] MRN: [id number] DOB: 1932/10/31 Today's Date: 10/03/2013   History of Present Illness  Pt is a 80 year old female s/p lap incisional hernia 7/27 with hx of multiple abdominal surgeries  Clinical Impression  Patient evaluated by Physical Therapy with no further acute PT needs identified. All education has been completed and the patient has no further questions. Pt mildly unsteady with gait however agreeable to use SPC or RW upon d/c.  Pt also reports family and friends can assist initially upon d/c.  Pt is not agreeable to f/u HHPT at this time.  PT is signing off. Thank you for this referral.     Follow Up Recommendations Home health PT;Supervision for mobility/OOB (however pt declines at this time)    Equipment Recommendations  None recommended by PT (pt reports access to RW, has SPC)    Recommendations for Other Services       Precautions / Restrictions Precautions Precautions: Fall Required Braces or Orthoses: Other Brace/Splint Other Brace/Splint: abdominal binder      Mobility  Bed Mobility               General bed mobility comments: not observed  Transfers Overall transfer level: Modified independent               General transfer comment: pt standing near sink upon entering room  Ambulation/Gait Ambulation/Gait assistance: Min guard Ambulation Distance (Feet): 280 Feet Assistive device: None Gait Pattern/deviations: Step-through pattern;Drifts right/left     General Gait Details: mildly unsteady with one LOB however pt able to self correct, recommended pt use cane or RW upon d/c for safety due to unsteady gait and she was agreeable  Stairs            Wheelchair Mobility    Modified Rankin (Stroke Patients Only)       Balance Overall balance assessment:  (denies hx of falls)                                           Pertinent Vitals/Pain  Premedicated, activity to tolerance    Home Living Family/patient expects to be discharged to:: Private residence Living Arrangements: Alone Available Help at Discharge: Family;Available 24 hours/day;Friend(s) Type of Home: House Home Access: Stairs to enter     Home Layout: One level Home Equipment: Kasandra Knudsen - single point Additional Comments: states her family/friends can assist upon d/c, also reports she can borrow a RW if needed    Prior Function Level of Independence: Independent               Hand Dominance        Extremity/Trunk Assessment               Lower Extremity Assessment: Generalized weakness         Communication   Communication: No difficulties  Cognition Arousal/Alertness: Awake/alert Behavior During Therapy: WFL for tasks assessed/performed Overall Cognitive Status: Within Functional Limits for tasks assessed                      General Comments      Exercises        Assessment/Plan    PT Assessment All further PT needs can be met in the next venue of care  PT Diagnosis Difficulty walking   PT Problem List  Decreased strength;Decreased balance;Decreased mobility  PT Treatment Interventions     PT Goals (Current goals can be found in the Care Plan section) Acute Rehab PT Goals PT Goal Formulation: No goals set, d/c therapy    Frequency     Barriers to discharge        Co-evaluation               End of Session Equipment Utilized During Treatment:  (abdominal binder) Activity Tolerance: Patient tolerated treatment well Patient left: in chair;with call bell/phone within reach Nurse Communication: Mobility status    Functional Assessment Tool Used: clinical judgement Functional Limitation: Mobility: Walking and moving around Mobility: Walking and Moving Around Current Status (Q3014): At least 1 percent but less than 20 percent impaired, limited or restricted Mobility: Walking and Moving Around Goal Status  [phone]): At least 1 percent but less than 20 percent impaired, limited or restricted Mobility: Walking and Moving Around Discharge Status [phone]): At least 1 percent but less than 20 percent impaired, limited or restricted    Time: 0947-0957 PT Time Calculation (min): 10 min   Charges:   PT Evaluation $Initial PT Evaluation Tier I: 1 Procedure PT Treatments $Gait Training: 8-22 mins   PT G Codes:   Functional Assessment Tool Used: clinical judgement Functional Limitation: Mobility: Walking and moving around    [place] E 10/03/2013, 10:48 AM Carmelia Bake, PT, DPT 10/03/2013 Pager: [phone]

## 2013-10-03 NOTE — Progress Notes (Signed)
OT Cancellation Note  Patient Details Name: VIDEL NOBREGA MRN: [id number] DOB: [birth year]   Cancelled Treatment:    Reason Eval/Treat Not Completed: Other (comment) Nursing states pt not feeling well right now and requests that OT hold off on OT eval currently and requests to check back later today. Will reattempt as schedule allows later today.  Jules Schick [phone] 10/03/2013, 11:19 AM

## 2013-10-03 NOTE — Evaluation (Addendum)
Occupational Therapy Evaluation Patient Details Name: [name] MRN: [id number] DOB: 10-22-1932 Today's Date: 10/03/2013    History of Present Illness Pt is a 80 year old female s/p lap incisional hernia 7/27 with hx of multiple abdominal surgeries   Clinical Impression   Pt was admitted for the above surgery.  She is not quite at baseline:  Normally she is independent.  Pt is currently set up to mod I for ADLs.  Pt plans to have a friend stay for the first night when she is discharged and she has intermittent assistance as well as an offer to stay at a friend's home.  Educated on safety and minimizing stress on incision.  No further OT is needed at this time.      Follow Up Recommendations   (Pt plans 24/7 for first day of discharge)    Equipment Recommendations  None recommended by OT    Recommendations for Other Services       Precautions / Restrictions Precautions Precautions: Fall Restrictions Weight Bearing Restrictions: No      Mobility Bed Mobility          Demonstrated/educated pt on sidelying to sit and sit to sidelying to minimize abdominal stress        Transfers Overall transfer level: Modified independent               General transfer comment: pt moving walking around room at mod I level.     Balance                                            ADL Overall ADL's : Needs assistance/impaired                         Toilet Transfer: Ambulation;Modified Independent             General ADL Comments: Pt is near baseline. She can perform ADLs with set up, sit to stand.  Mod I walking in room  Pt will have a friend stay overnight with her when she leaves for the first night.  Another friend has offered pt to come to her house, but pt prefers to return home.  Pt can cross legs to reach feet.  She is wearing an abdominal binder.  Educated in [place] to sit and supine for decreased stress/increased comfort. Also  educated on safety stepping into tub as she states she holds onto towel rack.       Vision                     Perception     Praxis      Pertinent Vitals/Pain Abdomen sore; repositioned     Hand Dominance     Extremity/Trunk Assessment Upper Extremity Assessment Upper Extremity Assessment: Overall WFL for tasks assessed           Communication Communication Communication: No difficulties   Cognition Arousal/Alertness: Awake/alert Behavior During Therapy: WFL for tasks assessed/performed Overall Cognitive Status: Within Functional Limits for tasks assessed                     General Comments       Exercises       Shoulder Instructions      Home Living Family/patient expects to be discharged to:: Private residence Living Arrangements: Alone  Available Help at Discharge: Family;Available 24 hours/day;Friend(s) Type of Home: House Home Access: Stairs to enter     Home Layout: One level     Bathroom Shower/Tub: Walk in Sports administrator Toilet: Handicapped height     Home Equipment: Shower seat          Prior Functioning/Environment Level of Independence: Independent             OT Diagnosis:     OT Problem List:     OT Treatment/Interventions:      OT Goals(Current goals can be found in the care plan section)    OT Frequency:     Barriers to D/C:            Co-evaluation              End of Session    Activity Tolerance: Patient tolerated treatment well Patient left: in chair;with call bell/phone within reach   Time: 1438-1453 OT Time Calculation (min): 15 min Charges:  OT General Charges $OT Visit: 1 Procedure OT Evaluation $Initial OT Evaluation Tier I: 1 Procedure OT Treatments $Therapeutic Activity: 8-22 mins G-Codes: OT G-codes **NOT FOR INPATIENT CLASS** Functional Assessment Tool Used: clinical observation/judgment Functional Limitation: Self care Self Care Current Status  (F6384): At least 1 percent but less than 20 percent impaired, limited or restricted Self Care Goal Status (Y6599): At least 1 percent but less than 20 percent impaired, limited or restricted Self Care Discharge Status [phone]): At least 1 percent but less than 20 percent impaired, limited or restricted  [facility] 10/03/2013, 3:46 PM  Lesle Chris, OTR/L [phone] 10/03/2013

## 2013-10-03 NOTE — Progress Notes (Signed)
2 Days Post-Op  Subjective: 1 episode of vomiting after eating fish. Sore. C/o binder riding up. +flatus.   Objective: Vital signs in last 24 hours: Temp:  [97.7 F (36.5 C)-98.5 F (36.9 C)] 97.7 F (36.5 C) (07/29 0519) Pulse Rate:  [55-64] 64 (07/29 0519) Resp:  [18-19] 19 (07/29 0519) BP: (105-126)/(42-61) 122/61 mmHg (07/29 0519) SpO2:  [95 %-98 %] 95 % (07/29 0519) Weight:  [138 lb (62.596 kg)] 138 lb (62.596 kg) (07/29 0630) Last BM Date: 09/30/13  Intake/Output from previous day: 07/28 0701 - 07/29 0700 In: 1620 [P.O.:720; I.V.:900] Out: 1150 [Urine:1150] Intake/Output this shift:    Alert, sitting in chair, nontoxic cta b/l Paced Soft, nd, incisions c/d/i; approp lower midline TTP No edema  Lab Results:   Recent Labs  10/02/13 0443  WBC 11.7*  HGB 13.2  HCT 39.1  PLT 202   BMET  Recent Labs  10/02/13 0443  NA 140  K 4.4  CL 105  CO2 22  GLUCOSE 113*  BUN 11  CREATININE 0.66  CALCIUM 9.1   PT/INR No results found for this basename: LABPROT, INR,  in the last 72 hours ABG No results found for this basename: PHART, PCO2, PO2, HCO3,  in the last 72 hours  Studies/Results: No results found.  Anti-infectives: Anti-infectives   Start     Dose/Rate Route Frequency Ordered Stop   10/01/13 0958  vancomycin (VANCOCIN) IVPB 1000 mg/200 mL premix     1,000 mg 200 mL/hr over 60 Minutes Intravenous On call to O.R. 10/01/13 0958 10/01/13 1226      Assessment/Plan: s/p Procedure(s): LAPAROSCOPIC INCISIONAL HERNIA (N/A) INSERTION OF MESH (N/A)  Looks ok. Vitals stable.   Awaiting PT/OT eval If tolerates breakfast/lunch without vomiting and depending on PT/OT eval, pt may be able to go home later today.  Discussed d/c instructions with pt Doesn't have to wear binder while in bed or in chair.  Explained if doesn't tolerate food then will need to stay  [name]. [name], MD, FACS General, Bariatric, & Minimally Invasive Surgery [facility]  Surgery, [place]   LOS: 2 days    Gayland Curry 10/03/2013

## 2013-10-03 NOTE — Progress Notes (Signed)
Patient complaining of some reflux symptoms this morning and later proceeded to have emesis x1.  Patient stated she did not feel any lingering nausea.  MD on floor and notified, will continue to monitor.

## 2013-10-04 DIAGNOSIS — K432 Incisional hernia without obstruction or gangrene: Secondary | ICD-10-CM | POA: Diagnosis not present

## 2013-10-04 NOTE — Discharge Summary (Signed)
Physician Discharge Summary  [name] DOB: [birth year] DOA: 10/01/2013  PCP: Estill Dooms, MD  Admit date: 10/01/2013 Discharge date: 10/04/2013  Recommendations for Outpatient Follow-up:   Follow-up Information   Follow up with Gayland Curry, MD. Schedule an appointment as soon as possible for a visit in 2 weeks.   Specialty:  General Surgery   Contact information:   [address] [place] [address] [phone]      Discharge Diagnoses:  Active Problems:   Incisional hernia  Surgical Procedure: laparoscopic repair of incisional hernia with mesh  Discharge Condition: good Disposition: home  Diet recommendation: cardiac  Filed Weights   10/01/13 1615 10/03/13 0630 10/04/13 0541  Weight: 141 lb 8.6 oz (64.2 kg) 138 lb (62.596 kg) 132 lb 6.4 oz (60.056 kg)    Hospital Course:  The patient was admitted for a planned laparoscopic repair of incisional hernia with mesh. In the recovery room in her pacemaker was interrogated. It was found to be working properly. Postoperatively she was maintained on chemical DVT prophylaxis. Her diet was gradually advanced. She did have an episode of nausea and vomiting after eating some fish. Her appetite improved thereafter. Her vital signs are main stable. Physical therapy and occupational therapy were consulted. The patient refused home health physical therapy and occupational therapy. On day of discharge, the patient had been tolerating over 75% of each meal. Her vital signs are stable.  She was ambulating. Her pain was controlled with oral medications. She had had a bowel movement. She had no n/v. She was deemed stable for discharge  BP 120/60  Pulse 52  Temp(Src) 97.6 F (36.4 C) (Oral)  Resp 18  Ht 5\' 8"  (1.727 m)  Wt 132 lb 6.4 oz (60.056 kg)  BMI 20.14 kg/m2  SpO2 100% Alert, sitting in chair, smiling CTA b/l Paced Soft, mild TTP (as expected) some bruising around umbilicus and L lat trocar  site; +BS. No cellulitis No edema   Discharge Instructions  Discharge Instructions   Call MD for:  difficulty breathing, headache or visual disturbances    Complete by:  As directed      Call MD for:  persistant nausea and vomiting    Complete by:  As directed      Call MD for:  redness, tenderness, or signs of infection (pain, swelling, redness, odor or green/yellow discharge around incision site)    Complete by:  As directed      Call MD for:  severe uncontrolled pain    Complete by:  As directed      Call MD for:    Complete by:  As directed   Temp >101     Diet - low sodium heart healthy    Complete by:  As directed      Discharge instructions    Complete by:  As directed   See [facility] discharge instructions     Driving Restrictions    Complete by:  As directed   Not until next Monday &/or not taking oxycodone during daytime     Increase activity slowly    Complete by:  As directed      Lifting restrictions    Complete by:  As directed   Do not lift, push, or pull anything greater than 10 pounds for 5 weeks            Medication List         acetaminophen 500 MG tablet  Commonly known  as:  TYLENOL  Take 500 mg by mouth every 6 (six) hours as needed for pain. For pain     aspirin 81 MG tablet  Take 81 mg by mouth daily. Takes bedtime daily. Instructed to stop x5 days prior.     Biotin 5000 MCG Tabs  Take 1 tablet by mouth daily.     CALCIUM + D PO  Take 2 tablets by mouth daily.     ESTRACE VAGINAL 0.1 MG/GM vaginal cream  Generic drug:  estradiol  Place 1 g vaginally 2 (two) times a week.     furosemide 20 MG tablet  Commonly known as:  LASIX  Take 20 mg by mouth daily as needed for fluid or edema.     hydroxypropyl methylcellulose 2.5 % ophthalmic solution  Commonly known as:  ISOPTO TEARS  Place 1 drop into both eyes 2 (two) times daily as needed for dry eyes.     levothyroxine 112 MCG tablet  Commonly known as:  SYNTHROID, LEVOTHROID  Take 112 mcg  by mouth daily before breakfast.     Magnesium 250 MG Tabs  Take 1 tablet by mouth daily.     metoprolol tartrate 25 MG tablet  Commonly known as:  LOPRESSOR  Take 12.5 mg by mouth daily.     multivitamin with minerals Tabs tablet  Take 1 tablet by mouth daily.     oxyCODONE 5 MG immediate release tablet  Commonly known as:  Oxy IR/ROXICODONE  Take 1-2 tablets (5-10 mg total) by mouth every 4 (four) hours as needed for severe pain.     potassium chloride SA 20 MEQ tablet  Commonly known as:  K-DUR,KLOR-CON  Take 20 mEq by mouth daily as needed (only with lasix).     RABEprazole 20 MG tablet  Commonly known as:  ACIPHEX  Take 20 mg by mouth daily.     trandolapril 2 MG tablet  Commonly known as:  MAVIK  Take 2 mg by mouth 2 (two) times daily.     VITAMIN B 12 PO  Take 1 tablet by mouth daily.     vitamin C 1000 MG tablet  Take 1,000 mg by mouth daily.           Follow-up Information   Follow up with Gayland Curry, MD. Schedule an appointment as soon as possible for a visit in 2 weeks.   Specialty:  General Surgery   Contact information:   [address] [place] [address] [phone]        The results of significant diagnostics from this hospitalization (including imaging, microbiology, ancillary and laboratory) are listed below for reference.    Significant Diagnostic Studies: No results found.  Microbiology: No results found for this or any previous visit (from the past 240 hour(s)).   Labs: Basic Metabolic Panel:  Recent Labs Lab 10/02/13 0443  NA 140  K 4.4  CL 105  CO2 22  GLUCOSE 113*  BUN 11  CREATININE 0.66  CALCIUM 9.1  MG 1.8  PHOS 3.4   Liver Function Tests: No results found for this basename: AST, ALT, ALKPHOS, BILITOT, PROT, ALBUMIN,  in the last 168 hours No results found for this basename: LIPASE, AMYLASE,  in the last 168 hours No results found for this basename: AMMONIA,  in the last 168  hours CBC:  Recent Labs Lab 10/02/13 0443  WBC 11.7*  HGB 13.2  HCT 39.1  MCV 88.1  PLT 202   Cardiac Enzymes:  No results found for this basename: CKTOTAL, CKMB, CKMBINDEX, TROPONINI,  in the last 168 hours BNP: BNP (last 3 results) No results found for this basename: PROBNP,  in the last 8760 hours CBG: No results found for this basename: GLUCAP,  in the last 168 hours  Active Problems:   Incisional hernia   Time coordinating discharge: 15 minutes  Signed:  Gayland Curry, MD [facility], [place] 10/04/2013, 8:23 AM

## 2013-10-04 NOTE — Discharge Instructions (Signed)
[facility], [place]   HERNIA REPAIR: POST OP INSTRUCTIONS  Always review your discharge instruction sheet given to you by the facility where your surgery was performed. IF YOU HAVE DISABILITY OR FAMILY LEAVE FORMS, YOU MUST BRING THEM TO THE OFFICE FOR PROCESSING.   DO NOT GIVE THEM TO YOUR DOCTOR.  1. A  prescription for pain medication may be given to you upon discharge.  Take your pain medication as prescribed, if needed.  If narcotic pain medicine is not needed, then you may take acetaminophen (Tylenol) or ibuprofen (Advil) as needed. 2. Take your usually prescribed medications unless otherwise directed. 3. If you need a refill on your pain medication, please contact your pharmacy.  They will contact our office to request authorization. Prescriptions will not be filled after 5 pm or on week-ends. 4. You should follow a light diet the first 24 hours after arrival home, such as soup and crackers, etc.  Be sure to include lots of fluids daily.  Resume your normal diet the day after surgery. 5. Most patients will experience some swelling and bruising around the umbilicus or in the groin and scrotum.  Ice packs and reclining will help.  Swelling and bruising can take several days to resolve.  6. It is common to experience some constipation if taking pain medication after surgery.  Increasing fluid intake and taking a stool softener (such as Colace) will usually help or prevent this problem from occurring.  A mild laxative (Milk of Magnesia or Miralax) should be taken according to package directions if there are no bowel movements after 48 hours. 7. .  If your surgeon used skin glue on the incision, you may shower in 24 hours.  The glue will flake off over the next 2-3 weeks.  Any sutures or staples will be removed at the office during your follow-up visit. 8. ACTIVITIES:  You may resume regular (light) daily activities beginning the next day--such as daily self-care, walking, climbing  stairs--gradually increasing activities as tolerated.  You may have sexual intercourse when it is comfortable.  Refrain from any heavy lifting or straining until approved by your doctor. a. You may drive when you are no longer taking prescription pain medication, you can comfortably wear a seatbelt, and you can safely maneuver your car and apply brakes. b. RETURN TO WORK: N/a 9. You should see your doctor in the office for a follow-up appointment approximately 2-3 weeks after your surgery.  Make sure that you call for this appointment within a day or two after you arrive home to insure a convenient appointment time. 10. OTHER INSTRUCTIONS: Do not lift, push, or pull anything greater than 10 pounds for 5 weeks; wear abdominal binder while up walking around    [place]: 1. Fever over 101.0 2. Inability to urinate 3. Nausea and/or vomiting 4. Extreme swelling or bruising 5. Continued bleeding from incision. 6. Increased pain, redness, or drainage from the incision  The clinic staff is available to answer your questions during regular business hours.  Please dont hesitate to call and ask to speak to one of the nurses for clinical concerns.  If you have a medical emergency, go to the nearest emergency room or call 911.  A surgeon from [facility] is always on call at the hospital   [address], [place], [place], [place]  [address] ?  [address], [place], [place]   [address] [phone] ? [phone] ? FAX [phone]  Web site: [email or web address]

## 2013-10-15 ENCOUNTER — Ambulatory Visit (INDEPENDENT_AMBULATORY_CARE_PROVIDER_SITE_OTHER): Payer: Medicare Other | Admitting: General Surgery

## 2013-10-15 ENCOUNTER — Encounter (INDEPENDENT_AMBULATORY_CARE_PROVIDER_SITE_OTHER): Payer: Self-pay | Admitting: General Surgery

## 2013-10-15 VITALS — BP 114/58 | HR 61 | Temp 98.0°F | Ht 69.0 in | Wt 136.0 lb

## 2013-10-15 DIAGNOSIS — Z09 Encounter for follow-up examination after completed treatment for conditions other than malignant neoplasm: Secondary | ICD-10-CM

## 2013-10-15 NOTE — Progress Notes (Signed)
Subjective:     Patient ID: [name], female   DOB: 01-Apr-1932, 80 y.o.   MRN: [id number]  HPI 80 year old female comes in today for her first postoperative visit after undergoing laparoscopic repair of incisional hernia with mesh on July 27. She was discharged home on July 30. She denies any fever, chills, nausea or vomiting. The loose stool she had in the hospital has resolved. She reports that her appetite is back to baseline. She does have some discomfort when she gets up from a seated position or out of bed. She denies any difficulty urinating. She is an anxious individual  Review of Systems     Objective:   Physical Exam BP 114/58  Pulse 61  Temp(Src) 98 F (36.7 C)  Ht 5\' 9"  (1.753 m)  Wt 136 lb (61.689 kg)  BMI 20.07 kg/m2 Alert, no apparent distress, nontoxic Clear to auscultation Abdomen-soft, nondistended, well-healed trocar incisions. No hematoma or seroma. No evidence of hernia recurrence    Assessment:     Status post laparoscopic repair of incisional hernia with mesh     Plan:     Overall doing very well. In fact she is having less discomfort than I thought she would at this point of her recovery. I explained to her that the minor discomfort she is having is common and will take time to resolve. Her monitor we discussed this preoperatively. I explained it may take up to 2-3 months for all the minor discomfort to ompletely resolve. followup 6 weeks  [name]. [name], MD, FACS General, Bariatric, & Minimally Invasive Surgery [facility], [place]

## 2013-10-15 NOTE — Patient Instructions (Signed)
Remember not to lift, push, or pull anything greater than 10 pounds until after 9/4 Can do light housework - push broom, mop, etc Please wear abdominal binder while up moving around until 8/27 See you in about 6 weeks

## 2013-10-29 ENCOUNTER — Other Ambulatory Visit: Payer: Self-pay | Admitting: Cardiology

## 2013-11-02 ENCOUNTER — Encounter: Payer: Self-pay | Admitting: Internal Medicine

## 2013-11-06 DIAGNOSIS — D1801 Hemangioma of skin and subcutaneous tissue: Secondary | ICD-10-CM | POA: Diagnosis not present

## 2013-11-06 DIAGNOSIS — Z85820 Personal history of malignant melanoma of skin: Secondary | ICD-10-CM | POA: Diagnosis not present

## 2013-11-06 DIAGNOSIS — D239 Other benign neoplasm of skin, unspecified: Secondary | ICD-10-CM | POA: Diagnosis not present

## 2013-11-06 DIAGNOSIS — L821 Other seborrheic keratosis: Secondary | ICD-10-CM | POA: Diagnosis not present

## 2013-11-06 DIAGNOSIS — L819 Disorder of pigmentation, unspecified: Secondary | ICD-10-CM | POA: Diagnosis not present

## 2013-11-21 ENCOUNTER — Ambulatory Visit (INDEPENDENT_AMBULATORY_CARE_PROVIDER_SITE_OTHER): Payer: Medicare Other | Admitting: Internal Medicine

## 2013-11-21 VITALS — BP 130/80 | HR 59 | Temp 97.5°F | Resp 18 | Ht 69.0 in | Wt 134.0 lb

## 2013-11-21 DIAGNOSIS — K432 Incisional hernia without obstruction or gangrene: Secondary | ICD-10-CM

## 2013-11-21 DIAGNOSIS — F329 Major depressive disorder, single episode, unspecified: Secondary | ICD-10-CM | POA: Diagnosis not present

## 2013-11-21 DIAGNOSIS — R350 Frequency of micturition: Secondary | ICD-10-CM | POA: Diagnosis not present

## 2013-11-21 DIAGNOSIS — F3289 Other specified depressive episodes: Secondary | ICD-10-CM

## 2013-11-21 MED ORDER — DESVENLAFAXINE ER 50 MG PO TB24: ORAL_TABLET | ORAL | Status: DC

## 2013-11-21 NOTE — Progress Notes (Signed)
Patient ID: [name], female   DOB: 1932-11-01, 80 y.o.   MRN: [id number]    Location:    [facility]  Place of Service:  OFFICE    Allergies  Allergen Reactions  . Hydrocodone Itching  . Cephalexin Itching and Swelling  . Doxycycline Other (See Comments)    Per pt: unknown  . Ofloxacin Other (See Comments)    Per pt: unknown  . Tape Other (See Comments)    Burns skin.   . Latex Rash    "If pt. Makes contact with or wearing"    Chief Complaint  Patient presents with  . Acute Visit    f/u hospitalization,    HPI:  She is still having problems with her hernia repairs. Has a small 1.5 cm hernia at the site of incision below the navel. Hernia repair area in the groin is doing well.  Has not filled Rx for Pristiq.  Medications: Patient's Medications  New Prescriptions   No medications on file  Previous Medications   ACETAMINOPHEN (TYLENOL) 500 MG TABLET    Take 500 mg by mouth every 6 (six) hours as needed for pain. For pain   ASCORBIC ACID (VITAMIN C) 1000 MG TABLET    Take 1,000 mg by mouth daily.   ASPIRIN 81 MG TABLET    Take 81 mg by mouth daily. Takes bedtime daily. Instructed to stop x5 days prior.   BIOTIN 5000 MCG TABS    Take 1 tablet by mouth daily.   CALCIUM CARBONATE-VITAMIN D (CALCIUM + D PO)    Take 2 tablets by mouth daily.    CYANOCOBALAMIN (VITAMIN B 12 PO)    Take 1 tablet by mouth daily.    ESTRACE VAGINAL 0.1 MG/GM VAGINAL CREAM    Place 1 g vaginally 2 (two) times a week.    FUROSEMIDE (LASIX) 20 MG TABLET    Take 20 mg by mouth daily as needed for fluid or edema.   HYDROXYPROPYL METHYLCELLULOSE (ISOPTO TEARS) 2.5 % OPHTHALMIC SOLUTION    Place 1 drop into both eyes 2 (two) times daily as needed for dry eyes.   LEVOTHYROXINE (SYNTHROID, LEVOTHROID) 112 MCG TABLET    Take 112 mcg by mouth daily before breakfast.   MAGNESIUM 250 MG TABS    Take 1 tablet by mouth daily.    METOPROLOL TARTRATE (LOPRESSOR) 25 MG TABLET    TAKE 1/2 TABLET BY MOUTH TWICE  DAILY.   MULTIPLE VITAMIN (MULTIVITAMIN WITH MINERALS) TABS    Take 1 tablet by mouth daily.   POTASSIUM CHLORIDE SA (K-DUR,KLOR-CON) 20 MEQ TABLET    Take 20 mEq by mouth daily as needed (only with lasix).   RABEPRAZOLE (ACIPHEX) 20 MG TABLET    Take 20 mg by mouth daily.    TRANDOLAPRIL (MAVIK) 2 MG TABLET    Take 2 mg by mouth 2 (two) times daily.  Modified Medications   No medications on file  Discontinued Medications   No medications on file     Review of Systems  Constitutional: Positive for appetite change, fatigue and unexpected weight change. Negative for fever and chills.  HENT: Positive for congestion, hearing loss, postnasal drip, rhinorrhea and sinus pressure. Negative for ear discharge, ear pain, facial swelling, nosebleeds, sneezing and tinnitus.   Eyes: Negative.   Respiratory: Positive for cough and shortness of breath. Negative for wheezing.   Cardiovascular: Positive for palpitations. Negative for chest pain and leg swelling.  Gastrointestinal: Negative for nausea, abdominal pain, diarrhea, blood in  stool and abdominal distention.       Hx erosive gastritis, HH, Barrett's esophagus. Ventral hernia at incision below navel.  Endocrine: Negative.   Genitourinary:       Nocturia x 3. Vaginal discomfort. Uses hormone cream from GYN, Dr. Nori Riis  Musculoskeletal: Positive for arthralgias, back pain, neck pain and neck stiffness. Negative for gait problem, joint swelling and myalgias.  Skin: Negative for pallor, rash and wound.       S/P right herniorraphy. Right index finger has onycholysis distally and tenderness.  Neurological: Positive for dizziness, numbness and headaches. Negative for tremors, seizures, syncope and speech difficulty.  Hematological: Negative.   Psychiatric/Behavioral: Positive for dysphoric mood. The patient is nervous/anxious.     Filed Vitals:   11/21/13 1147  BP: 130/80  Pulse: 59  Temp: 97.5 F (36.4 C)  TempSrc: Oral  Resp: 18  Height: 5'  9" (1.753 m)  Weight: 134 lb (60.782 kg)  SpO2: 99%   Body mass index is 19.78 kg/(m^2).  Physical Exam  Constitutional:  Frail, thin body. Losing weight.  HENT:  Right Ear: External ear normal.  Left Ear: External ear normal.  Mouth/Throat: Oropharynx is clear and moist.  Nasal polyp  Eyes:  Corrective lensees  Neck: No JVD present. No tracheal deviation present. No thyromegaly present.  stiff  Cardiovascular: Normal rate and regular rhythm.  Exam reveals no gallop and no friction rub.   No murmur heard. AICD in left upper chest. Raynaud's disease.  Pulmonary/Chest: Effort normal. No respiratory distress. She has no wheezes. She has rales. She exhibits no tenderness.  Abdominal: Soft. Bowel sounds are normal. She exhibits no distension and no mass. There is no tenderness.  Right inguinal area has scar from surgery.  Midline incisional hernia that is small and about 3" below the navel.  Musculoskeletal: Normal range of motion. She exhibits no edema.  Tender right knee.   Lymphadenopathy:    She has no cervical adenopathy.  Neurological:  Reduced vibratory sensation in both feet.  Skin: No rash noted. No erythema. No pallor.  Onycholysis of right index fingeer. Tender to palpation.  Psychiatric:  Anxious. Depressed affect. Low energy.     Labs reviewed: Admission on 10/01/2013, Discharged on 10/04/2013  Component Date Value Ref Range Status  . Sodium 10/02/2013 140  137 - 147 mEq/L Final  . Potassium 10/02/2013 4.4  3.7 - 5.3 mEq/L Final  . Chloride 10/02/2013 105  96 - 112 mEq/L Final  . CO2 10/02/2013 22  19 - 32 mEq/L Final  . Glucose, Bld 10/02/2013 113* 70 - 99 mg/dL Final  . BUN 10/02/2013 11  6 - 23 mg/dL Final  . Creatinine, Ser 10/02/2013 0.66  0.50 - 1.10 mg/dL Final  . Calcium 10/02/2013 9.1  8.4 - 10.5 mg/dL Final  . GFR calc non Af Amer 10/02/2013 82* >90 mL/min Final  . GFR calc Af Amer 10/02/2013 >90  >90 mL/min Final   Comment: (NOTE)                           The eGFR has been calculated using the CKD EPI equation.                          This calculation has not been validated in all clinical situations.  eGFR's persistently <90 mL/min signify possible Chronic Kidney                          Disease.  . Anion gap 10/02/2013 13  5 - 15 Final  . WBC 10/02/2013 11.7* 4.0 - 10.5 K/uL Final  . RBC 10/02/2013 4.44  3.87 - 5.11 MIL/uL Final  . Hemoglobin 10/02/2013 13.2  12.0 - 15.0 g/dL Final  . HCT 10/02/2013 39.1  36.0 - 46.0 % Final  . MCV 10/02/2013 88.1  78.0 - 100.0 fL Final  . MCH 10/02/2013 29.7  26.0 - 34.0 pg Final  . MCHC 10/02/2013 33.8  30.0 - 36.0 g/dL Final  . RDW 10/02/2013 13.3  11.5 - 15.5 % Final  . Platelets 10/02/2013 202  150 - 400 K/uL Final  . Magnesium 10/02/2013 1.8  1.5 - 2.5 mg/dL Final  . Phosphorus 10/02/2013 3.4  2.3 - 4.6 mg/dL Final  Clinical Support on 09/24/2013  Component Date Value Ref Range Status  . Date Time Interrogation Session 09/24/2013 [id number]   Final  . Pulse Generator Manufacturer 09/24/2013 [employer]   Final  . Pulse Gen Model 09/24/2013 V173   Final  . Pulse Gen Serial Number 09/24/2013 [id number]   Final  . RV Sense Sensitivity 09/24/2013 2.5   Final  . RV Adaptation Mode 09/24/2013 Fixed Pacing   Final  . LV Sense Sensitivity 09/24/2013 2.5   Final  . LV Adaptation Mode 09/24/2013 Fixed Pacing   Final  . RA Pace Amplitude 09/24/2013 2.0   Final  . RV Pace PulseWidth 09/24/2013 0.8   Final  . RV Pace Amplitude 09/24/2013 3.2   Final  . LV Pace PulseWidth 09/24/2013 1.0   Final  . LV Pace Amplitude 09/24/2013 2.4   Final  . LV Pace Capture Mode 09/24/2013 Fixed Pacing   Final  . Zone Setting Type Category 09/24/2013 VT   Final  . Zone Detect Interval 09/24/2013 375.0   Final  . LV Impedance 09/24/2013 651.0   Final  . LV Amplitude 09/24/2013 7.2   Final  . LV Pacing Amplitude 09/24/2013 1.4000   Final  . LV PACING PULSEWIDTH 09/24/2013 1.0    Final  . RA Impedance 09/24/2013 535.0   Final  . RA Amplitude 09/24/2013 2.5   Final  . RA Pacing Amplitude 09/24/2013 0.8000   Final  . RA Pacing PulseWidth 09/24/2013 0.4   Final  . RV IMPEDANCE 09/24/2013 440.0   Final  . RV Amplitude 09/24/2013 15.4   Final  . RV Pacing Amplitude 09/24/2013 2.2000   Final  . RV Pacing PulseWidth 09/24/2013 0.8   Final  . Battery Status 09/24/2013 BOS   Final  . Eval Rhythm 09/24/2013 SB@49    Final  . Miscellaneous Comment 09/24/2013    Final                   Value:CRT-P device check in clinic. Normal device function. Thresholds, sensing, impedance consistent with previous measurements. Histograms appropriate for patient and level of activity. No mode switches or ventricular high rate episodes. Patient                          bi-ventricularly pacing 99% of the time. Device programmed with appropriate safety margins---changed LV output from 2.2 to 2.4V. Estimated longevity 6.39yrs. ROV w/ Dr. Lovena Le in 16mo.  Hospital Outpatient Visit on 09/21/2013  Component Date Value Ref  Range Status  . WBC 09/21/2013 6.0  4.0 - 10.5 K/uL Final  . RBC 09/21/2013 4.46  3.87 - 5.11 MIL/uL Final  . Hemoglobin 09/21/2013 13.3  12.0 - 15.0 g/dL Final  . HCT 09/21/2013 39.1  36.0 - 46.0 % Final  . MCV 09/21/2013 87.7  78.0 - 100.0 fL Final  . MCH 09/21/2013 29.8  26.0 - 34.0 pg Final  . MCHC 09/21/2013 34.0  30.0 - 36.0 g/dL Final  . RDW 09/21/2013 13.8  11.5 - 15.5 % Final  . Platelets 09/21/2013 200  150 - 400 K/uL Final  . Neutrophils Relative % 09/21/2013 60  43 - 77 % Final  . Neutro Abs 09/21/2013 3.6  1.7 - 7.7 K/uL Final  . Lymphocytes Relative 09/21/2013 25  12 - 46 % Final  . Lymphs Abs 09/21/2013 1.5  0.7 - 4.0 K/uL Final  . Monocytes Relative 09/21/2013 11  3 - 12 % Final  . Monocytes Absolute 09/21/2013 0.7  0.1 - 1.0 K/uL Final  . Eosinophils Relative 09/21/2013 4  0 - 5 % Final  . Eosinophils Absolute 09/21/2013 0.2  0.0 - 0.7 K/uL Final  . Basophils  Relative 09/21/2013 0  0 - 1 % Final  . Basophils Absolute 09/21/2013 0.0  0.0 - 0.1 K/uL Final  . Sodium 09/21/2013 136* 137 - 147 mEq/L Final  . Potassium 09/21/2013 4.8  3.7 - 5.3 mEq/L Final  . Chloride 09/21/2013 100  96 - 112 mEq/L Final  . CO2 09/21/2013 26  19 - 32 mEq/L Final  . Glucose, Bld 09/21/2013 82  70 - 99 mg/dL Final  . BUN 09/21/2013 17  6 - 23 mg/dL Final  . Creatinine, Ser 09/21/2013 0.71  0.50 - 1.10 mg/dL Final  . Calcium 09/21/2013 9.5  8.4 - 10.5 mg/dL Final  . Total Protein 09/21/2013 6.9  6.0 - 8.3 g/dL Final  . Albumin 09/21/2013 3.8  3.5 - 5.2 g/dL Final  . AST 09/21/2013 23  0 - 37 U/L Final  . ALT 09/21/2013 16  0 - 35 U/L Final  . Alkaline Phosphatase 09/21/2013 92  39 - 117 U/L Final  . Total Bilirubin 09/21/2013 0.2* 0.3 - 1.2 mg/dL Final  . GFR calc non Af Amer 09/21/2013 80* >90 mL/min Final  . GFR calc Af Amer 09/21/2013 >90  >90 mL/min Final   Comment: (NOTE)                          The eGFR has been calculated using the CKD EPI equation.                          This calculation has not been validated in all clinical situations.                          eGFR's persistently <90 mL/min signify possible Chronic Kidney                          Disease.  . Anion gap 09/21/2013 10  5 - 15 Final    Assessment/Plan  1. Incisional hernia, without obstruction or gangrene See Dr. [name] on 11/29/13 as scheduled.  2. Depressive disorder, not elsewhere classified New Rx for Pristiq

## 2013-11-22 LAB — URINALYSIS
BILIRUBIN UA: NEGATIVE
GLUCOSE, UA: NEGATIVE
Ketones, UA: NEGATIVE
Leukocytes, UA: NEGATIVE
Nitrite, UA: NEGATIVE
Protein, UA: NEGATIVE
RBC, UA: NEGATIVE
SPEC GRAV UA: 1.012 (ref 1.005–1.030)
UUROB: 0.2 mg/dL (ref 0.0–1.9)
pH, UA: 6 (ref 5.0–7.5)

## 2013-11-23 LAB — URINE CULTURE: ORGANISM ID, BACTERIA: NO GROWTH

## 2013-11-29 ENCOUNTER — Encounter (INDEPENDENT_AMBULATORY_CARE_PROVIDER_SITE_OTHER): Payer: Medicare Other | Admitting: General Surgery

## 2013-11-29 ENCOUNTER — Telehealth (INDEPENDENT_AMBULATORY_CARE_PROVIDER_SITE_OTHER): Payer: Self-pay

## 2013-11-29 DIAGNOSIS — Z8719 Personal history of other diseases of the digestive system: Secondary | ICD-10-CM

## 2013-11-29 DIAGNOSIS — R109 Unspecified abdominal pain: Secondary | ICD-10-CM

## 2013-11-29 DIAGNOSIS — Z9889 Other specified postprocedural states: Secondary | ICD-10-CM

## 2013-11-29 NOTE — Telephone Encounter (Signed)
Pt seen in office today by Dr [name] and ordered CT to be scheduled on pt for abdominal pain hx of ventral hernia repair.

## 2013-12-04 ENCOUNTER — Ambulatory Visit
Admission: RE | Admit: 2013-12-04 | Discharge: 2013-12-04 | Disposition: A | Discharge status: Home / Self Care | Payer: Medicare Other | Source: Ambulatory Visit | Attending: General Surgery | Admitting: General Surgery

## 2013-12-04 ENCOUNTER — Other Ambulatory Visit (INDEPENDENT_AMBULATORY_CARE_PROVIDER_SITE_OTHER): Payer: Self-pay | Admitting: General Surgery

## 2013-12-04 DIAGNOSIS — Z8719 Personal history of other diseases of the digestive system: Secondary | ICD-10-CM

## 2013-12-04 DIAGNOSIS — Z9889 Other specified postprocedural states: Secondary | ICD-10-CM

## 2013-12-04 DIAGNOSIS — I723 Aneurysm of iliac artery: Secondary | ICD-10-CM | POA: Diagnosis not present

## 2013-12-04 DIAGNOSIS — K4090 Unilateral inguinal hernia, without obstruction or gangrene, not specified as recurrent: Secondary | ICD-10-CM | POA: Diagnosis not present

## 2013-12-04 DIAGNOSIS — R109 Unspecified abdominal pain: Secondary | ICD-10-CM

## 2013-12-04 MED ORDER — IOHEXOL 300 MG/ML  SOLN
100.0000 mL | Freq: Once | INTRAMUSCULAR | Status: AC | PRN
  Administered 2013-12-04: 100 mL via INTRAVENOUS

## 2013-12-05 ENCOUNTER — Ambulatory Visit: Payer: Medicare Other | Admitting: Internal Medicine

## 2013-12-19 ENCOUNTER — Encounter: Payer: Self-pay | Admitting: Cardiology

## 2013-12-19 ENCOUNTER — Ambulatory Visit (INDEPENDENT_AMBULATORY_CARE_PROVIDER_SITE_OTHER): Payer: Medicare Other | Admitting: Cardiology

## 2013-12-19 VITALS — BP 128/60 | HR 54 | Ht 69.0 in | Wt 135.6 lb

## 2013-12-19 DIAGNOSIS — Z95810 Presence of automatic (implantable) cardiac defibrillator: Secondary | ICD-10-CM

## 2013-12-19 DIAGNOSIS — I5022 Chronic systolic (congestive) heart failure: Secondary | ICD-10-CM | POA: Diagnosis not present

## 2013-12-19 NOTE — Patient Instructions (Signed)
Continue your current therapy  I will see you in 6 months.   

## 2013-12-19 NOTE — Progress Notes (Signed)
[name] Date of Birth: [birth year] Medical Record #[id number]  History of Present Illness: [name] is seen today for follow up. She has multiple medical issues which include a cardiomyopathy, underlying BiV/ICD which resulted in improvement of her EF, LBBB, HTN, thyroid disease, melanoma, anxiety/depression and chronic systolic HF. EF was 55% by last Echo in 2/15 .   She had a difficult time with hernia surgery last year. She had a fourth surgery in July for correction of a ventral hernia. Following one of the surgeries she had a wide complex arrhythmia that was felt by Dr. Rayann Heman to be her intrinsic rhythm (over her Biv paced rhythm). This was felt to be related to increased adrenergic tone. She was managed with beta blocker and digoxin.   On follow up today she is doing well from a cardiac standpoint. No chest pain or SOB. No palpitations. Feels like she is finally getting back to normal. Still very anxious.    Current Outpatient Prescriptions  Medication Sig Dispense Refill  . acetaminophen (TYLENOL) 500 MG tablet Take 500 mg by mouth every 6 (six) hours as needed for pain. For pain      . Ascorbic Acid (VITAMIN C) 1000 MG tablet Take 1,000 mg by mouth daily.      Marland Kitchen aspirin 81 MG tablet Take 81 mg by mouth daily. Takes bedtime daily. Instructed to stop x5 days prior.      . Biotin 5000 MCG TABS Take 1 tablet by mouth daily.      . Calcium Carbonate-Vitamin D (CALCIUM + D PO) Take 2 tablets by mouth daily.       . Cyanocobalamin (VITAMIN B 12 PO) Take 1 tablet by mouth daily.       Marland Kitchen ESTRACE VAGINAL 0.1 MG/GM vaginal cream Place 1 g vaginally 2 (two) times a week.       . furosemide (LASIX) 20 MG tablet Take 20 mg by mouth daily as needed for fluid or edema.      . hydroxypropyl methylcellulose (ISOPTO TEARS) 2.5 % ophthalmic solution Place 1 drop into both eyes 2 (two) times daily as needed for dry eyes.      Marland Kitchen levothyroxine (SYNTHROID, LEVOTHROID) 112 MCG tablet Take 112 mcg by  mouth daily before breakfast.      . Magnesium 250 MG TABS Take 1 tablet by mouth daily.       . metoprolol tartrate (LOPRESSOR) 25 MG tablet TAKE 1/2 TABLET BY MOUTH TWICE DAILY.  60 tablet  3  . Multiple Vitamin (MULTIVITAMIN WITH MINERALS) TABS Take 1 tablet by mouth daily.      . potassium chloride SA (K-DUR,KLOR-CON) 20 MEQ tablet Take 20 mEq by mouth daily as needed (only with lasix).      . RABEprazole (ACIPHEX) 20 MG tablet Take 20 mg by mouth daily.       . trandolapril (MAVIK) 2 MG tablet Take 2 mg by mouth 2 (two) times daily.      Marland Kitchen Desvenlafaxine ER (PRISTIQ) 50 MG TB24 One daily to help nerves  90 tablet  4  . [DISCONTINUED] omeprazole (PRILOSEC) 20 MG capsule Take 1 capsule (20 mg total) by mouth daily.  30 capsule  11   No current facility-administered medications for this visit.    Allergies  Allergen Reactions  . Hydrocodone Itching  . Cephalexin Itching and Swelling  . Doxycycline Other (See Comments)    Per pt: unknown  . Ofloxacin Other (See Comments)    Per pt:  unknown  . Tape Other (See Comments)    Burns skin.   . Latex Rash    "If pt. Makes contact with or wearing"    Past Medical History  Diagnosis Date  . Nonischemic cardiomyopathy     EF initially 20%; Last measurement up to 50% per echo in August of 2012  . CHF (congestive heart failure)   . LBBB (left bundle branch block)   . Mitral regurgitation   . Melanoma     right arm  . Hypothyroidism   . Erosive gastritis 08/2004  . Hyperplastic polyps of stomach 06/2002  . Barrett's esophagus 04/2008  . GERD (gastroesophageal reflux disease)   . Diverticulitis   . Anxiety disorder   . HTN (hypertension)   . ICD (implantable cardiac defibrillator) in place     BiV/ICD; s/p removal of ICD with insertion of BiV PPM 04/26/12  . Hair thinning   . Unspecified sinusitis (chronic)   . Unspecified chronic bronchitis   . Depressive disorder, not elsewhere classified   . Restless legs syndrome (RLS)   .  Chronic airway obstruction, not elsewhere classified   . Benign neoplasm of colon   . Other and unspecified hyperlipidemia   . Unspecified nasal polyp   . Pain in joint, lower leg   . Synovial cyst of popliteal space   . Complication of anesthesia     slow to wake up  . Pacemaker 04/26/2012  . Sleep apnea   . Other and unspecified hyperlipidemia   . Dysthymic disorder   . Palpitations     Past Surgical History  Procedure Laterality Date  . Defibrillator insertion      s/p removal of previously implanted BiV ICD and insertion of a new BiV pacemaker on 04/26/12  . Left arm surgery    . Abdominal hysterectomy  1076    endometriosis  . Right breast lumpectomy  1988    Dr. Marylene Buerger  . Appendectomy    . Tonsillectomy    . Right knee surgery      arthroscopy: Dr. Shellia Carwin  . Excision of wen  1984    Dr. Parks Ranger  . Excise mole of lip  2001    Dr. Larena Sox  . Inguinal hernia repair Right 09/26/2012    Procedure: RIGHT INGUINAL HERNIA REPAIR WITH MESH;  Surgeon: Odis Hollingshead, MD;  Location: [place];  Service: General;  Laterality: Right;  . Insertion of mesh Right 09/26/2012    Procedure: INSERTION OF MESH;  Surgeon: Odis Hollingshead, MD;  Location: [place];  Service: General;  Laterality: Right;  . Inguinal hernia repair Right 12/25/2012    Procedure: explor right groin, small bowel rescection, tissue repair right femoral hernia;  Surgeon: Adin Hector, MD;  Location: [facility];  Service: General;  Laterality: Right;  . Laparoscopy N/A 01/04/2013    Procedure: Diagnostic Laparoscopy, exploratory laparotomy with small bowel resection, closure of right femoral hernia repair;  Surgeon: Madilyn Hook, DO;  Location: [facility];  Service: General;  Laterality: N/A;  . Femoral hernia repair  12/25/2012    Dr Dalbert Batman  . Femoral hernia repair  01/04/2013    Recurrent - Dr. Lilyan Punt  . Incisional hernia repair N/A 10/01/2013    Procedure: LAPAROSCOPIC INCISIONAL HERNIA;  Surgeon: Gayland Curry, MD;  Location: [facility];  Service: General;  Laterality: N/A;  . Insertion of mesh N/A 10/01/2013    Procedure: INSERTION OF MESH;  Surgeon: Gayland Curry, MD;  Location: [facility];  Service: General;  Laterality: N/A;    History  Smoking status  . Former Smoker  . Types: Cigarettes  . Quit date: 12/22/1993  Smokeless tobacco  . Never Used    Comment: Does'nt reall year quit     History  Alcohol Use No    Family History  Problem Relation Age of Onset  . Heart disease Other     maternal side  . Colon cancer Paternal Uncle   . Breast cancer Maternal Aunt   . Diabetes Neg Hx   . Stroke Father     Review of Systems: The review of systems is per the HPI.  All other systems were reviewed and are negative.  Physical Exam: BP 128/60  Pulse 54  Ht 5\' 9"  (1.753 m)  Wt 135 lb 9.6 oz (61.508 kg)  BMI 20.02 kg/m2 Patient is very pleasant and in no acute distress.Skin is warm and dry. Color is normal.  HEENT is unremarkable. Normocephalic/atraumatic. PERRL. Sclera are nonicteric. Neck is supple. No masses. No JVD. Lungs are clear. Cardiac exam shows a regular rate and rhythm. Normal S1-2. No gallop or murmur. Abdomen is soft. Extremities are without edema. Gait and ROM are intact. No gross neurologic deficits noted.  Wt Readings from Last 3 Encounters:  12/19/13 135 lb 9.6 oz (61.508 kg)  11/21/13 134 lb (60.782 kg)  10/15/13 136 lb (61.689 kg)     LABORATORY DATA: Lab Results  Component Value Date   WBC 11.7* 10/02/2013   HGB 13.2 10/02/2013   HCT 39.1 10/02/2013   PLT 202 10/02/2013   GLUCOSE 113* 10/02/2013   TRIG 52 01/08/2013   ALT 16 09/21/2013   AST 23 09/21/2013   NA 140 10/02/2013   K 4.4 10/02/2013   CL 105 10/02/2013   CREATININE 0.66 10/02/2013   BUN 11 10/02/2013   CO2 22 10/02/2013   TSH 3.290 08/06/2013   INR 1.08 09/19/2012   Echo Study Conclusions from February 2015  Study Conclusions  - Left ventricle: Systolic function was normal. The estimated ejection  fraction was in the range of 55% to 60%. Wall motion was normal; there were no regional wall motion abnormalities. The study is not technically sufficient to allow evaluation of LV diastolic function. The E/e' ratio is >10, suggesting elevated LV filling pressure. - Mitral valve: Mildly thickened leaflets . Not well visualized. Mildto moderate regurgitation. - Left atrium: Severely dilated (43 ml/m2). - Tricuspid valve: Mild regurgitation. - Pulmonary arteries: PA peak pressure: 23mm Hg (S). - Systemic veins: The IVC measures >2.1 cm, but collapses >50%, suggesting an elevated RA pressure of 8 mmHg. - Pericardium, extracardiac: There was no pericardial effusion.  Recent ICD check showed biv pacing 98% of the time. 8 beat run of NSVT.  CT ABDOMEN AND PELVIS WITH CONTRAST  TECHNIQUE:  Multidetector CT imaging of the abdomen and pelvis was performed  using the standard protocol following bolus administration of  intravenous contrast.  CONTRAST: 179mL OMNIPAQUE IOHEXOL 300 MG/ML SOLN  COMPARISON: CT 09/03/2013, 01/01/2013, 09/23/2011, 10/04/2008.  FINDINGS:  The diffuse fatty infiltration the liver. Spleen normal. Pancreas  normal. No biliary distention. Gallbladder nondistended.  Adrenals normal. Small renal simple cyst. 9 mm low-density lesion in  the right kidney unchanged from multiple prior exams most likely  hyperdense cyst . No hydronephrosis or obstructing ureteral stone.  Bladder is nondistended. Phleboliths. Hysterectomy. No pathologic  free pelvic fluid collections.  No adenopathy. Mild ectasia of the infrarenal abdominal aorta to 2  cm noted. Both common iliac arteries are ectatic at 13 mm. A  partially thrombosed aneurysm of the right internal iliac artery  aneurysm measuring 2 cm noted. Vascular surgical consultation  suggested to further evaluate.  Appendectomy. Large amount of stool in the colon suggesting  constipation. No evidence of bowel obstruction. The stomach  is  nondistended. Stable small right inguinal hernia is present with  herniation of a small portion right colon. No evidence of  obstruction. No prominent inguinal hernias noted. The small 1 .7 x  4.2 cm fluid collection in the midline of the anterior lower  abdomen. This could represent a small seroma from prior midline  hernia repair. No air noted within this fluid collection to suggest  abscess. Clinical correlation suggested.  Lung bases are clear. Stable cardiomegaly. Cardiac pacer.  Degenerative changes lumbosacral spine.  IMPRESSION:  1. 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 Vascular surgical  consultation suggested to further evaluate.  2. Small simple fluid collection noted along the lower mid anterior  abdominal wall, this may represent a small seroma and could be  related to prior hernia repair. No evidence of ventral wall hernia.  Small right inguinal hernia is present and stable with herniation of  only a minimal portion of the right colon. No evidence of bowel  obstruction . No prominent inguinal hernia noted .  3. Large amount of stool in colon suggesting constipation.  Electronically Signed  By: Marcello Moores Register  On: 12/04/2013 14:11    Assessment / Plan: 1.  Cardiomyopathy with EF now normal - has BiV/ICD in place -  I think overall she is doing great.  Continue low dose metoprolol and ACEi. Follow up in 6 months.  2. NSVT. Follow up ICD/biV in device clinic  3. Small iliac aneurysm. I personally reviewed multiple CT scans dating back to 2006. She has aortic and iliac atherosclerosis with calcification. The small iliac aneurysm noted on the right is partially thrombosed and unchanged in appearance over multiple scans since 2006.

## 2014-01-08 ENCOUNTER — Ambulatory Visit (INDEPENDENT_AMBULATORY_CARE_PROVIDER_SITE_OTHER): Payer: Medicare Other | Admitting: Internal Medicine

## 2014-01-08 ENCOUNTER — Ambulatory Visit (INDEPENDENT_AMBULATORY_CARE_PROVIDER_SITE_OTHER): Payer: Medicare Other | Admitting: *Deleted

## 2014-01-08 ENCOUNTER — Encounter: Payer: Self-pay | Admitting: *Deleted

## 2014-01-08 ENCOUNTER — Encounter: Payer: Self-pay | Admitting: Internal Medicine

## 2014-01-08 VITALS — BP 140/75 | HR 55 | Temp 97.5°F | Ht 69.0 in | Wt 137.0 lb

## 2014-01-08 DIAGNOSIS — Z23 Encounter for immunization: Secondary | ICD-10-CM

## 2014-01-08 DIAGNOSIS — J449 Chronic obstructive pulmonary disease, unspecified: Secondary | ICD-10-CM | POA: Diagnosis not present

## 2014-01-08 DIAGNOSIS — F329 Major depressive disorder, single episode, unspecified: Secondary | ICD-10-CM | POA: Diagnosis not present

## 2014-01-08 DIAGNOSIS — F411 Generalized anxiety disorder: Secondary | ICD-10-CM

## 2014-01-08 DIAGNOSIS — F32A Depression, unspecified: Secondary | ICD-10-CM

## 2014-01-08 DIAGNOSIS — R5383 Other fatigue: Secondary | ICD-10-CM

## 2014-01-08 DIAGNOSIS — R5381 Other malaise: Secondary | ICD-10-CM | POA: Diagnosis not present

## 2014-01-08 DIAGNOSIS — R112 Nausea with vomiting, unspecified: Secondary | ICD-10-CM | POA: Diagnosis not present

## 2014-01-08 DIAGNOSIS — K432 Incisional hernia without obstruction or gangrene: Secondary | ICD-10-CM | POA: Diagnosis not present

## 2014-01-08 NOTE — Telephone Encounter (Signed)
error 

## 2014-01-08 NOTE — Progress Notes (Signed)
Patient ID: [name], female   DOB: Oct 14, 1932, 80 y.o.   MRN: [id number]    Facility  [facility]    Place of Service:   OFFICE   Allergies  Allergen Reactions  . Hydrocodone Itching  . Cephalexin Itching and Swelling  . Doxycycline Other (See Comments)    Per pt: unknown  . Ofloxacin Other (See Comments)    Per pt: unknown  . Tape Other (See Comments)    Burns skin.   . Latex Rash    "If pt. Makes contact with or wearing"    Chief Complaint  Patient presents with  . Medical Management of Chronic Issues    6 week follow up    HPI:  Incisional hernia, without obstruction or gangrene: patient has seen her surgeon and he reassured her that the anterior bulge at the incision was not a reoccurrence of the hernia. She had a CT of the abd that suggested that the lump at her incision was a seroma, not a hernia.  Nausea and vomiting, vomiting of unspecified type: resolved  Malaise and fatigue: improving, but she still tires quickly  Chronic obstructive pulmonary disease, unspecified COPD, unspecified chronic bronchitis type: recurrent issues of cough and congestion. Currently has active respiratory issues with cough and congestion. Not producing much phlegm and afebrile. Feels "rotten". Coricidin seems to help clear symptoms for a while.  Depression: She has not tried Mult[employer]. She got the prescription filled, but has not taken any. She worries she will have side effects and will feel worse.  Generalized anxiety disorder: psychiatric symptoms are a combination of depression and a long standing generalized anxiety disorder. She says she does not want to feel this way, but is unable to commit to medications and she rejected offer to help her find a psychiatrist or psychologist for counselling.    Medications: Patient's Medications  New Prescriptions   No medications on file  Previous Medications   ACETAMINOPHEN (TYLENOL) 500 MG TABLET    Take 500 mg by mouth every 6 (six) hours as  needed for pain. For pain   ASCORBIC ACID (VITAMIN C) 1000 MG TABLET    Take 1,000 mg by mouth daily.   ASPIRIN 81 MG TABLET    Take 81 mg by mouth daily. Takes bedtime daily. Instructed to stop x5 days prior.   BIOTIN 5000 MCG TABS    Take 1 tablet by mouth daily.   CALCIUM CARBONATE-VITAMIN D (CALCIUM + D PO)    Take 2 tablets by mouth daily.    CYANOCOBALAMIN (VITAMIN B 12 PO)    Take 1 tablet by mouth daily.    DESVENLAFAXINE ER (PRISTIQ) 50 MG TB24    One daily to help nerves   ESTRACE VAGINAL 0.1 MG/GM VAGINAL CREAM    Place 1 g vaginally 2 (two) times a week.    FUROSEMIDE (LASIX) 20 MG TABLET    Take 20 mg by mouth daily as needed for fluid or edema.   HYDROXYPROPYL METHYLCELLULOSE (ISOPTO TEARS) 2.5 % OPHTHALMIC SOLUTION    Place 1 drop into both eyes 2 (two) times daily as needed for dry eyes.   LEVOTHYROXINE (SYNTHROID, LEVOTHROID) 112 MCG TABLET    Take 112 mcg by mouth daily before breakfast.   MAGNESIUM 250 MG TABS    Take 1 tablet by mouth daily.    METOPROLOL TARTRATE (LOPRESSOR) 25 MG TABLET    TAKE 1/2 TABLET BY MOUTH TWICE DAILY.   MULTIPLE VITAMIN (MULTIVITAMIN WITH MINERALS) TABS  Take 1 tablet by mouth daily.   OXYCODONE (OXY IR/ROXICODONE) 5 MG IMMEDIATE RELEASE TABLET       POTASSIUM CHLORIDE SA (K-DUR,KLOR-CON) 20 MEQ TABLET    Take 20 mEq by mouth daily as needed (only with lasix).   RABEPRAZOLE (ACIPHEX) 20 MG TABLET    Take 20 mg by mouth daily.    TRANDOLAPRIL (MAVIK) 2 MG TABLET    Take 2 mg by mouth 2 (two) times daily.  Modified Medications   No medications on file  Discontinued Medications   No medications on file     Review of Systems  Constitutional: Positive for appetite change, fatigue and unexpected weight change. Negative for fever and chills.  HENT: Positive for congestion, hearing loss, postnasal drip, rhinorrhea and sinus pressure. Negative for ear discharge, ear pain, facial swelling, nosebleeds, sneezing and tinnitus.   Eyes: Negative.     Respiratory: Positive for cough and shortness of breath. Negative for wheezing.   Cardiovascular: Positive for palpitations. Negative for chest pain and leg swelling.  Gastrointestinal: Negative for nausea, abdominal pain, diarrhea, blood in stool and abdominal distention.       Hx erosive gastritis, HH, Barrett's esophagus.  Endocrine: Negative.   Genitourinary:       Nocturia x 3. Vaginal discomfort. Uses hormone cream from GYN, Dr. Nori Riis  Musculoskeletal: Positive for back pain, arthralgias, neck pain and neck stiffness. Negative for myalgias, joint swelling and gait problem.  Skin: Negative for pallor, rash and wound.       S/P right herniorraphy. Right index finger has onycholysis distally and tenderness.  Neurological: Positive for dizziness, numbness and headaches. Negative for tremors, seizures, syncope and speech difficulty.  Hematological: Negative.   Psychiatric/Behavioral: Positive for dysphoric mood. The patient is nervous/anxious.     Filed Vitals:   01/08/14 1451  BP: 140/75  Pulse: 55  Temp: 97.5 F (36.4 C)  TempSrc: Oral  Height: 5\' 9"  (1.753 m)  Weight: 137 lb (62.143 kg)  SpO2: 98%   Body mass index is 20.22 kg/(m^2).  Physical Exam  Constitutional:  Frail, thin body. Losing weight.  HENT:  Right Ear: External ear normal.  Left Ear: External ear normal.  Mouth/Throat: Oropharynx is clear and moist.  Nasal polyp  Eyes:  Corrective lensees  Neck: No JVD present. No tracheal deviation present. No thyromegaly present.  stiff  Cardiovascular: Normal rate and regular rhythm.  Exam reveals no gallop and no friction rub.   No murmur heard. AICD in left upper chest. Raynaud's disease.  Pulmonary/Chest: Effort normal. No respiratory distress. She has no wheezes. She has rales. She exhibits no tenderness.  Abdominal: Soft. Bowel sounds are normal. She exhibits no distension and no mass. There is no tenderness.  Right inguinal area has scar from surgery.   Midline incisional seroma, previously thought to be a hernia, has resolved.r  Musculoskeletal: Normal range of motion. She exhibits no edema.  Tender right knee.   Lymphadenopathy:    She has no cervical adenopathy.  Neurological:  Reduced vibratory sensation in both feet.  Skin: No rash noted. No erythema. No pallor.  Onycholysis of right index fingeer. Tender to palpation.  Psychiatric:  Anxious. Depressed affect. Low energy.     Labs reviewed: Office Visit on 11/21/2013  Component Date Value Ref Range Status  . Specific Gravity, UA 11/21/2013 1.012  1.005 - 1.030 Final  . pH, UA 11/21/2013 6.0  5.0 - 7.5 Final  . Color, UA 11/21/2013 Yellow  Yellow Final  .  Appearance Ur 11/21/2013 Clear  Clear Final  . Leukocytes, UA 11/21/2013 Negative  Negative Final  . Protein, UA 11/21/2013 Negative  Negative/Trace Final  . Glucose, UA 11/21/2013 Negative  Negative Final  . Ketones, UA 11/21/2013 Negative  Negative Final  . RBC, UA 11/21/2013 Negative  Negative Final  . Bilirubin, UA 11/21/2013 Negative  Negative Final  . Urobilinogen, Ur 11/21/2013 0.2  0.0 - 1.9 mg/dL Final  . Nitrite, UA 11/21/2013 Negative  Negative Final  . Urine Culture, Routine 11/21/2013 Final report   Final  . Result 1 11/21/2013 No growth   Final   Ct Abdomen Pelvis W Contrast  12/04/2013   CLINICAL DATA:  Pain.  Evaluate for hernia.  EXAM: CT ABDOMEN AND PELVIS WITH CONTRAST  TECHNIQUE: Multidetector CT imaging of the abdomen and pelvis was performed using the standard protocol following bolus administration of intravenous contrast.  CONTRAST:  129mL OMNIPAQUE IOHEXOL 300 MG/ML  SOLN  COMPARISON:  CT 09/03/2013, 01/01/2013, 09/23/2011, 10/04/2008.  FINDINGS: The diffuse fatty infiltration the liver. Spleen normal. Pancreas normal. No biliary distention. Gallbladder nondistended.  Adrenals normal. Small renal simple cyst. 9 mm low-density lesion in the right kidney unchanged from multiple prior exams most likely  hyperdense cyst . No hydronephrosis or obstructing ureteral stone. Bladder is nondistended. Phleboliths. Hysterectomy. No pathologic free pelvic fluid collections.  No adenopathy. Mild ectasia of the infrarenal abdominal aorta to 2 cm noted. Both common iliac arteries are ectatic at 13 mm. A partially thrombosed aneurysm of the right internal iliac artery aneurysm measuring 2 cm noted. Vascular surgical consultation suggested to further evaluate.  Appendectomy. Large amount of stool in the colon suggesting constipation. No evidence of bowel obstruction. The stomach is nondistended. Stable small right inguinal hernia is present with herniation of a small portion right colon. No evidence of obstruction. No prominent inguinal hernias noted. The small 1 .7 x 4.2 cm fluid collection in the midline of the anterior lower abdomen. This could represent a small seroma from prior midline hernia repair. No air noted within this fluid collection to suggest abscess. Clinical correlation suggested.  Lung bases are clear. Stable cardiomegaly. Cardiac pacer. Degenerative changes lumbosacral spine.  IMPRESSION: 1. 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 Vascular surgical consultation suggested to further evaluate. 2. Small simple fluid collection noted along the lower mid anterior abdominal wall, this may represent a small seroma and could be related to prior hernia repair. No evidence of ventral wall hernia. Small right inguinal hernia is present and stable with herniation of only a minimal portion of the right colon. No evidence of bowel obstruction . No prominent inguinal hernia noted . 3. Large amount of stool in colon suggesting constipation.   Electronically Signed   By: [place]   On: 12/04/2013 14:11    Assessment/Plan 1. Incisional hernia, without obstruction or gangrene This was a seroma that is now resolved  2. Nausea and vomiting, vomiting of unspecified type resolved  3. Malaise and fatigue Some  improvement  4. Chronic obstructive pulmonary disease, unspecified COPD, unspecified chronic bronchitis type Continue Coricidin.  5. Depression Encouraged her to try the Pristiq  6. Generalized anxiety disorder May improve with Pristiq.  7. Lengthy discussion about getting Zostavax. She thinks she has allergy to Neomycin. Broke out in irritated rash in the past when using Neosporin. Tolerated Polysporin ointment. Product literature suggests avoidance of immunization with Zostavax if there is allergy to Neomycin.  8. Received quadrivalent flu vaccine today.

## 2014-01-09 NOTE — Patient Instructions (Signed)
Continue Coricidin for respiratory symptoms. Call if fever occurs, or if sputum changes.

## 2014-01-15 IMAGING — CR DG ABDOMEN ACUTE W/ 1V CHEST
4 series · 4 of 4 positions shown · non-contrast
Comparison: Chest radiograph 12/20/2012

CLINICAL DATA: General abdominal pain

EXAM:
ACUTE ABDOMEN SERIES (ABDOMEN 2 VIEW & CHEST 1 VIEW)

[w chest pa]
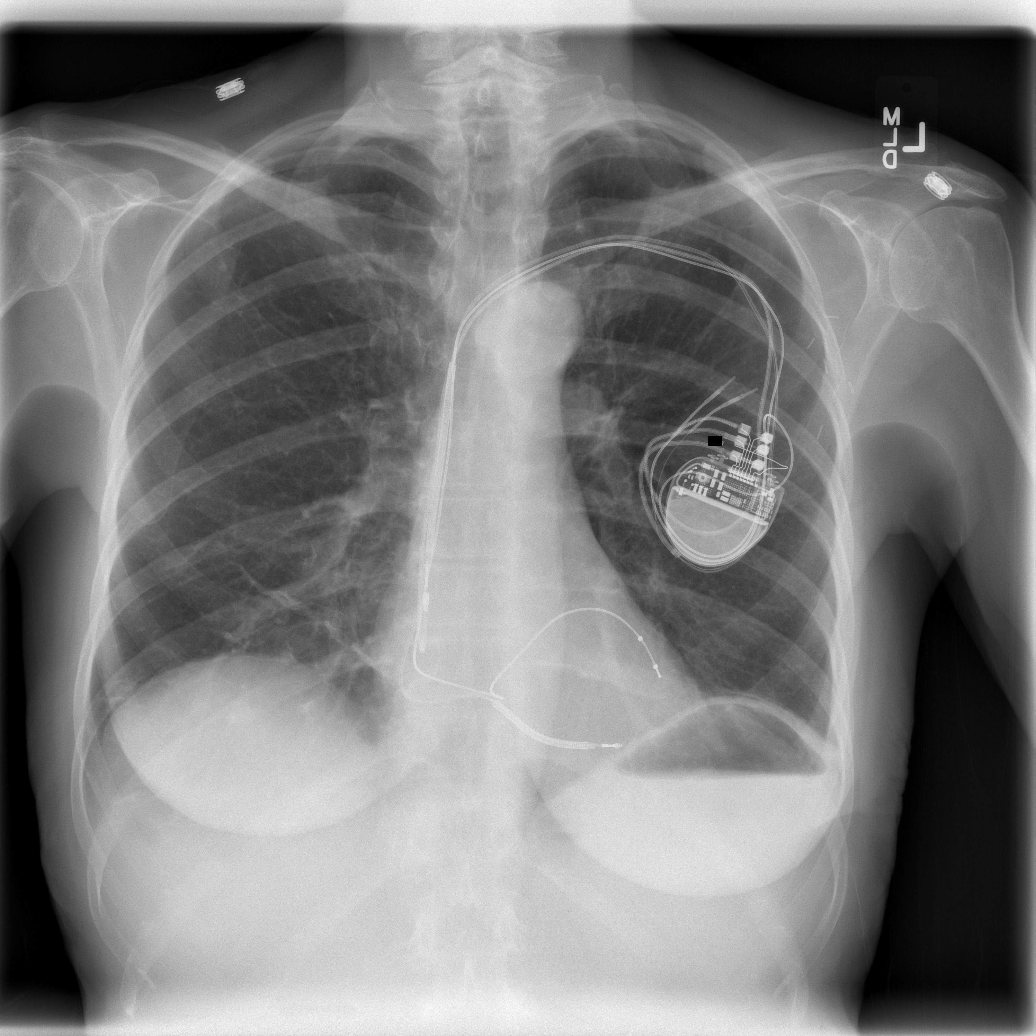

[w abdomen upright *]
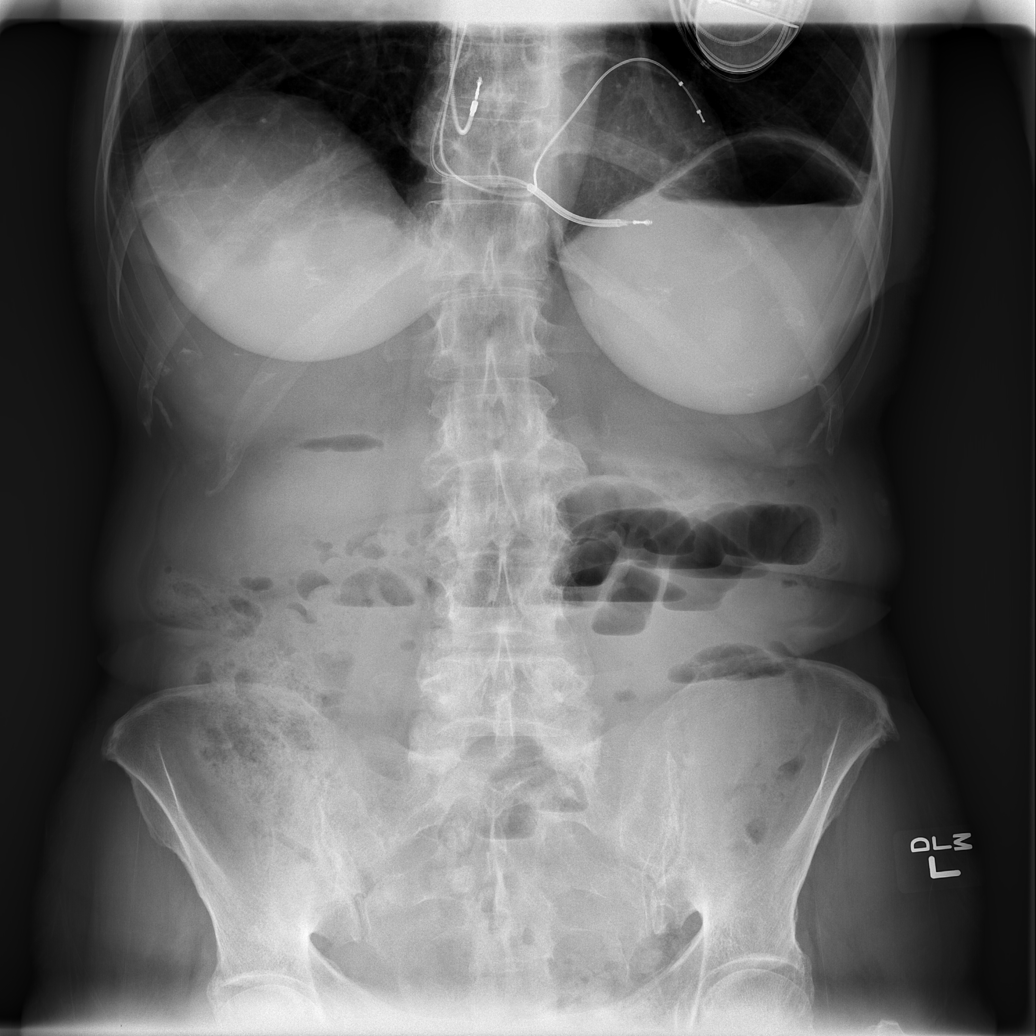

[t abdomen supine (1 of 2)]
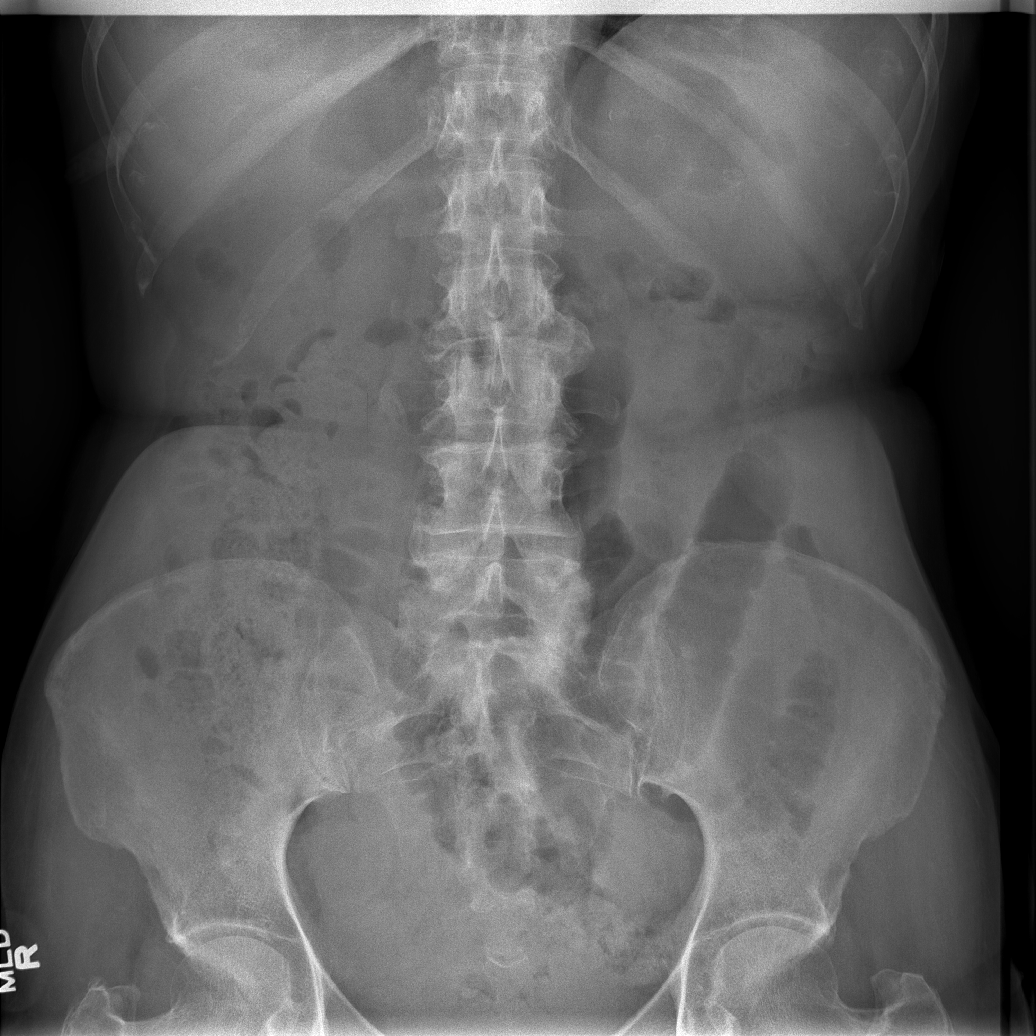

[t abdomen supine (2 of 2)]
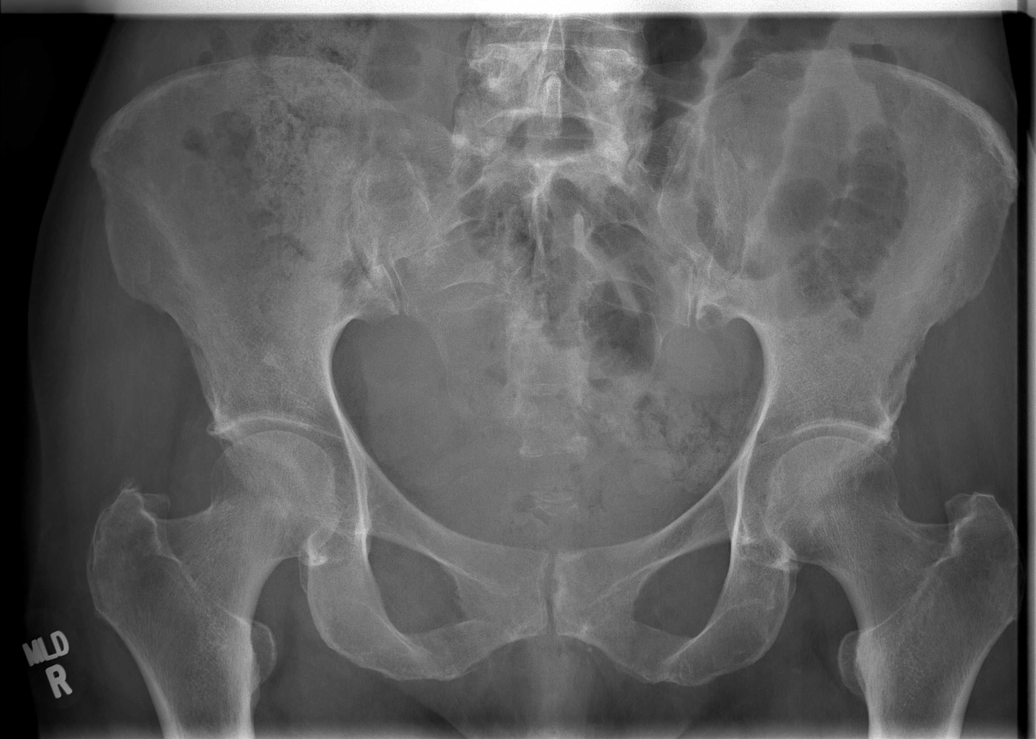

[4 of 4 positions shown; findings below may reference images not displayed]

FINDINGS: Left-sided pacemaker with 3 continuously does unchanged. Two
additional non connected leads are again demonstrated just superior
to the generator pack. There is mild scarring at the lung bases
which is unchanged.

There are several short air-fluid levels within the small bowel. On
the supine exam, there are dilated loops of small up to 3.0 cm which
is decreased from 3.5 cm on prior. There is gas and stool in the
rectum. No intraperitoneal free air on the supine exam.
IMPRESSION: Improvement in small bowel obstruction versus ileus pattern. No
evidence of high-grade obstruction.

## 2014-01-24 IMAGING — CR DG ABDOMEN 2V
1 series · 1 of 1 positions shown · non-contrast
Comparison: 12/30/2012

CLINICAL DATA: Abdominal distension

EXAM:
ABDOMEN - 2 VIEW

[w abdomen upright *]
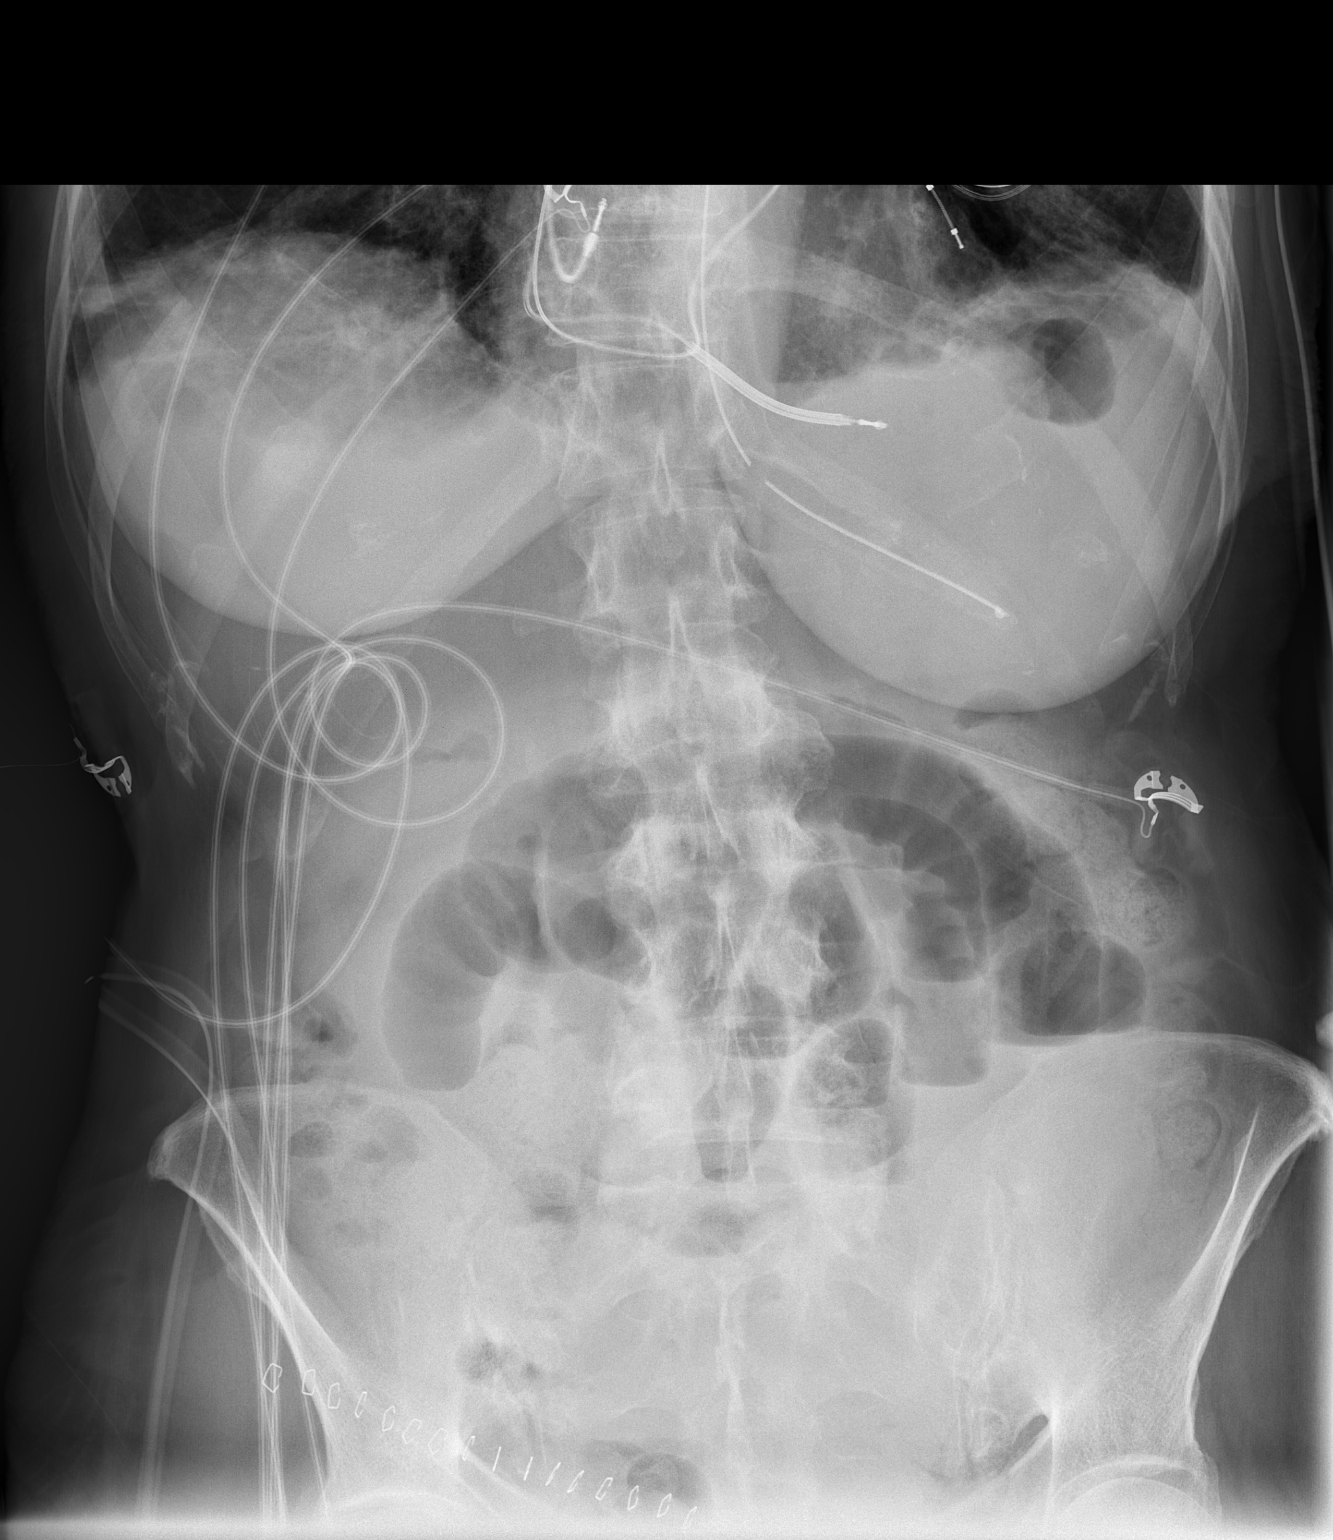

[1 of 1 positions shown; findings below may reference images not displayed]

FINDINGS: NG tube in place noted. Distended small bowel loops in mid abdomen
with multiple air-fluid levels highly suspicious for small bowel
obstruction. Again noted skin staples in right lower quadrant. No
free abdominal air. Dual lead cardiac pacemaker leads partially
visualized.
IMPRESSION: Distended small bowel loops in mid abdomen with multiple air-fluid
levels highly suspicious for small bowel obstruction. NG tube in
place.

## 2014-01-25 DIAGNOSIS — J018 Other acute sinusitis: Secondary | ICD-10-CM | POA: Diagnosis not present

## 2014-02-13 DIAGNOSIS — N390 Urinary tract infection, site not specified: Secondary | ICD-10-CM | POA: Diagnosis not present

## 2014-02-14 ENCOUNTER — Encounter (HOSPITAL_COMMUNITY): Payer: Self-pay | Admitting: Internal Medicine

## 2014-02-14 DIAGNOSIS — R319 Hematuria, unspecified: Secondary | ICD-10-CM | POA: Diagnosis not present

## 2014-03-20 DIAGNOSIS — R3915 Urgency of urination: Secondary | ICD-10-CM | POA: Diagnosis not present

## 2014-03-20 DIAGNOSIS — N760 Acute vaginitis: Secondary | ICD-10-CM | POA: Diagnosis not present

## 2014-03-20 DIAGNOSIS — R319 Hematuria, unspecified: Secondary | ICD-10-CM | POA: Diagnosis not present

## 2014-03-21 DIAGNOSIS — J018 Other acute sinusitis: Secondary | ICD-10-CM | POA: Diagnosis not present

## 2014-04-09 ENCOUNTER — Other Ambulatory Visit: Payer: Self-pay | Admitting: Internal Medicine

## 2014-04-09 DIAGNOSIS — E785 Hyperlipidemia, unspecified: Secondary | ICD-10-CM

## 2014-04-09 DIAGNOSIS — G2581 Restless legs syndrome: Secondary | ICD-10-CM

## 2014-04-09 DIAGNOSIS — E039 Hypothyroidism, unspecified: Secondary | ICD-10-CM

## 2014-04-15 ENCOUNTER — Other Ambulatory Visit: Payer: Medicare Other

## 2014-04-15 DIAGNOSIS — E785 Hyperlipidemia, unspecified: Secondary | ICD-10-CM | POA: Diagnosis not present

## 2014-04-15 DIAGNOSIS — G2581 Restless legs syndrome: Secondary | ICD-10-CM | POA: Diagnosis not present

## 2014-04-15 DIAGNOSIS — E039 Hypothyroidism, unspecified: Secondary | ICD-10-CM | POA: Diagnosis not present

## 2014-04-16 LAB — LIPID PANEL
CHOL/HDL RATIO: 2.3 ratio (ref 0.0–4.4)
CHOLESTEROL TOTAL: 170 mg/dL (ref 100–199)
HDL: 73 mg/dL (ref >39)
LDL Calculated: 84 mg/dL (ref 0–99)
Triglycerides: 66 mg/dL (ref 0–149)
VLDL CHOLESTEROL CAL: 13 mg/dL (ref 5–40)

## 2014-04-16 LAB — COMPREHENSIVE METABOLIC PANEL
ALK PHOS: 85 IU/L (ref 39–117)
ALT: 15 IU/L (ref 0–32)
AST: 22 IU/L (ref 0–40)
Albumin/Globulin Ratio: 1.7 (ref 1.1–2.5)
Albumin: 4 g/dL (ref 3.5–4.8)
BUN / CREAT RATIO: 18 (ref 11–26)
BUN: 12 mg/dL (ref 8–27)
Bilirubin Total: 0.4 mg/dL (ref 0.0–1.2)
CALCIUM: 9.3 mg/dL (ref 8.7–10.3)
CHLORIDE: 101 mmol/L (ref 97–108)
CO2: 24 mmol/L (ref 18–29)
Creatinine, Ser: 0.67 mg/dL (ref 0.57–1.00)
GFR calc Af Amer: 97 mL/min/{1.73_m2} (ref >59)
GFR, EST NON AFRICAN AMERICAN: 84 mL/min/{1.73_m2} (ref >59)
Globulin, Total: 2.4 g/dL (ref 1.5–4.5)
Glucose: 81 mg/dL (ref 65–99)
Potassium: 3.9 mmol/L (ref 3.5–5.2)
SODIUM: 139 mmol/L (ref 134–144)
Total Protein: 6.4 g/dL (ref 6.0–8.5)

## 2014-04-16 LAB — CBC WITH DIFFERENTIAL
Basophils Absolute: 0 10*3/uL (ref 0.0–0.2)
Basos: 0 %
Eos: 4 %
Eosinophils Absolute: 0.3 10*3/uL (ref 0.0–0.4)
HCT: 41.3 % (ref 34.0–46.6)
Hemoglobin: 14.2 g/dL (ref 11.1–15.9)
Immature Grans (Abs): 0 10*3/uL (ref 0.0–0.1)
Immature Granulocytes: 0 %
Lymphocytes Absolute: 1.5 10*3/uL (ref 0.7–3.1)
Lymphs: 24 %
MCH: 30.4 pg (ref 26.6–33.0)
MCHC: 34.4 g/dL (ref 31.5–35.7)
MCV: 88 fL (ref 79–97)
Monocytes Absolute: 0.7 10*3/uL (ref 0.1–0.9)
Monocytes: 11 %
Neutrophils Absolute: 3.7 10*3/uL (ref 1.4–7.0)
Neutrophils Relative %: 61 %
RBC: 4.67 x10E6/uL (ref 3.77–5.28)
RDW: 14.1 % (ref 12.3–15.4)
WBC: 6.2 10*3/uL (ref 3.4–10.8)

## 2014-04-16 LAB — TSH: TSH: 1.74 u[IU]/mL (ref 0.450–4.500)

## 2014-04-17 ENCOUNTER — Encounter: Payer: Self-pay | Admitting: Internal Medicine

## 2014-04-17 ENCOUNTER — Ambulatory Visit (INDEPENDENT_AMBULATORY_CARE_PROVIDER_SITE_OTHER): Payer: Medicare Other | Admitting: Internal Medicine

## 2014-04-17 VITALS — BP 134/86 | HR 58 | Temp 97.6°F | Ht 69.0 in | Wt 135.0 lb

## 2014-04-17 DIAGNOSIS — E039 Hypothyroidism, unspecified: Secondary | ICD-10-CM

## 2014-04-17 DIAGNOSIS — R350 Frequency of micturition: Secondary | ICD-10-CM | POA: Diagnosis not present

## 2014-04-17 DIAGNOSIS — F32A Depression, unspecified: Secondary | ICD-10-CM

## 2014-04-17 DIAGNOSIS — R053 Chronic cough: Secondary | ICD-10-CM | POA: Insufficient documentation

## 2014-04-17 DIAGNOSIS — K2100 Gastro-esophageal reflux disease with esophagitis, without bleeding: Secondary | ICD-10-CM

## 2014-04-17 DIAGNOSIS — R5381 Other malaise: Secondary | ICD-10-CM | POA: Diagnosis not present

## 2014-04-17 DIAGNOSIS — J42 Unspecified chronic bronchitis: Secondary | ICD-10-CM

## 2014-04-17 DIAGNOSIS — M8580 Other specified disorders of bone density and structure, unspecified site: Secondary | ICD-10-CM | POA: Diagnosis not present

## 2014-04-17 DIAGNOSIS — E785 Hyperlipidemia, unspecified: Secondary | ICD-10-CM | POA: Diagnosis not present

## 2014-04-17 DIAGNOSIS — R5383 Other fatigue: Secondary | ICD-10-CM

## 2014-04-17 DIAGNOSIS — R937 Abnormal findings on diagnostic imaging of other parts of musculoskeletal system: Secondary | ICD-10-CM

## 2014-04-17 DIAGNOSIS — K2270 Barrett's esophagus without dysplasia: Secondary | ICD-10-CM

## 2014-04-17 DIAGNOSIS — R05 Cough: Secondary | ICD-10-CM

## 2014-04-17 DIAGNOSIS — N390 Urinary tract infection, site not specified: Secondary | ICD-10-CM | POA: Insufficient documentation

## 2014-04-17 DIAGNOSIS — R059 Cough, unspecified: Secondary | ICD-10-CM

## 2014-04-17 DIAGNOSIS — F329 Major depressive disorder, single episode, unspecified: Secondary | ICD-10-CM

## 2014-04-17 DIAGNOSIS — N3090 Cystitis, unspecified without hematuria: Secondary | ICD-10-CM | POA: Diagnosis not present

## 2014-04-17 DIAGNOSIS — I723 Aneurysm of iliac artery: Secondary | ICD-10-CM | POA: Diagnosis not present

## 2014-04-17 NOTE — Progress Notes (Signed)
Patient ID: [name], female   DOB: 03-21-1932, 81 y.o.   MRN: [id number]    Facility  [facility]    Place of Service:   OFFICE  Extended Emergency Contact Information Primary Emergency Contact: Harris,Richard Address: [address], [place] [address] [place] Phone: [phone] Relation: Significant other   Laveda Norman: [place] [phone] cell   [phone] home   Sh is in [place] from Dec to April  Mary Benton:[phone]   Allergies  Allergen Reactions  . Hydrocodone Itching  . Cephalexin Itching and Swelling  . Doxycycline Other (See Comments)    Per pt: unknown  . Ofloxacin Other (See Comments)    Per pt: unknown  . Tape Other (See Comments)    Burns skin.   . Latex Rash    "If pt. Makes contact with or wearing"  . Neomycin Rash    Chief Complaint  Patient presents with  . Medical Management of Chronic Issues    Follow up    HPI:  Pains on the right side of the head and then radiates in to the right neck. Lasts a few seconds. Occurs about once a month. No pattern to occurrence  Cough: Persistent. Likely related to her chronic bronchitis. Rarely producing any sputum.  Chronic bronchitis, unspecified chronic bronchitis type: Associated with cough, rattling in her chest, and heavy sensations in the chest.: Controlled  Hyperlipidemia  Hypothyroidism, unspecified hypothyroidism type: Compensated  Malaise and fatigue: Chronic issue which is really unchanged.  Depression: Has been afraid to take any antidepressant medications. Refuses consultation with a psychologist or psychiatrist.  Aneurysm of iliac artery: Chronic problem. She should have ultrasound of this area later this year. She may desire referral to a vascular surgeon.  Barrett's esophagus: Occasional reflux and heartburn.  Cystitis: Resolved  Frequency of urination: Present for months. Volume of urine is normal.  Gastroesophageal  reflux disease with esophagitis: Occasional reflux and heartburn  Abnormal bone density screening - time to schedule another bone density    Medications: Patient's Medications  New Prescriptions   No medications on file  Previous Medications   ACETAMINOPHEN (TYLENOL) 500 MG TABLET    Take 500 mg by mouth every 6 (six) hours as needed for pain. For pain   ASCORBIC ACID (VITAMIN C) 1000 MG TABLET    Take 1,000 mg by mouth daily.   ASPIRIN 81 MG TABLET    Take 81 mg by mouth daily. Takes bedtime daily. Instructed to stop x5 days prior.   BIOTIN 5000 MCG TABS    Take 1 tablet by mouth daily.   CALCIUM CARBONATE-VITAMIN D (CALCIUM + D PO)    Take 2 tablets by mouth daily.    CYANOCOBALAMIN (VITAMIN B 12 PO)    Take 1 tablet by mouth daily.    DESVENLAFAXINE ER (PRISTIQ) 50 MG TB24    One daily to help nerves   ESTRACE VAGINAL 0.1 MG/GM VAGINAL CREAM    Place 1 g vaginally 2 (two) times a week.    FUROSEMIDE (LASIX) 20 MG TABLET    Take 20 mg by mouth daily as needed for fluid or edema.   HYDROXYPROPYL METHYLCELLULOSE (ISOPTO TEARS) 2.5 % OPHTHALMIC SOLUTION    Place 1 drop into both eyes 2 (two) times daily as needed for dry eyes.   LEVOTHYROXINE (SYNTHROID, LEVOTHROID) 112 MCG TABLET    Take 112 mcg by mouth daily  before breakfast.   MAGNESIUM 250 MG TABS    Take 1 tablet by mouth daily.    METOPROLOL TARTRATE (LOPRESSOR) 25 MG TABLET    TAKE 1/2 TABLET BY MOUTH TWICE DAILY.   METRONIDAZOLE (METROGEL) 0.75 % VAGINAL GEL       MULTIPLE VITAMIN (MULTIVITAMIN WITH MINERALS) TABS    Take 1 tablet by mouth daily.   OXYCODONE (OXY IR/ROXICODONE) 5 MG IMMEDIATE RELEASE TABLET       POTASSIUM CHLORIDE SA (K-DUR,KLOR-CON) 20 MEQ TABLET    Take 20 mEq by mouth daily as needed (only with lasix).   RABEPRAZOLE (ACIPHEX) 20 MG TABLET    Take 20 mg by mouth daily.    TRANDOLAPRIL (MAVIK) 2 MG TABLET    Take 2 mg by mouth 2 (two) times daily.  Modified Medications   No medications on file  Discontinued  Medications   No medications on file     Review of Systems  Constitutional: Positive for appetite change, fatigue and unexpected weight change. Negative for fever and chills.  HENT: Positive for congestion, hearing loss, postnasal drip, rhinorrhea and sinus pressure. Negative for ear discharge, ear pain, facial swelling, nosebleeds, sneezing and tinnitus.   Eyes: Negative.   Respiratory: Positive for cough and shortness of breath. Negative for wheezing.   Cardiovascular: Positive for palpitations. Negative for chest pain and leg swelling.  Gastrointestinal: Negative for nausea, abdominal pain, diarrhea, blood in stool and abdominal distention.       Hx erosive gastritis, HH, Barrett's esophagus.  Endocrine: Negative.   Genitourinary:       Nocturia x 3. Vaginal discomfort. Uses hormone cream from GYN, Dr. Nori Riis  Musculoskeletal: Positive for back pain, arthralgias, neck pain and neck stiffness. Negative for myalgias, joint swelling and gait problem.  Skin: Negative for pallor, rash and wound.       S/P right herniorraphy. Right index finger has onycholysis distally and tenderness.  Neurological: Positive for dizziness, numbness and headaches. Negative for tremors, seizures, syncope and speech difficulty.  Hematological: Negative.   Psychiatric/Behavioral: Positive for dysphoric mood. The patient is nervous/anxious.     There were no vitals filed for this visit. There is no weight on file to calculate BMI.  Physical Exam  Constitutional:  Frail, thin body. Losing weight.  HENT:  Right Ear: External ear normal.  Left Ear: External ear normal.  Mouth/Throat: Oropharynx is clear and moist.  Nasal polyp  Eyes:  Corrective lensees  Neck: No JVD present. No tracheal deviation present. No thyromegaly present.  stiff  Cardiovascular: Normal rate and regular rhythm.  Exam reveals no gallop and no friction rub.   No murmur heard. AICD in left upper chest. Raynaud's disease.    Pulmonary/Chest: Effort normal. No respiratory distress. She has no wheezes. She has rales. She exhibits no tenderness.  Abdominal: Soft. Bowel sounds are normal. She exhibits no distension and no mass. There is no tenderness.  Right inguinal area has scar from surgery.  Midline incisional seroma, previously thought to be a hernia, has resolved.  Musculoskeletal: Normal range of motion. She exhibits no edema.  Tender right knee.   Lymphadenopathy:    She has no cervical adenopathy.  Neurological:  Reduced vibratory sensation in both feet.  Skin: No rash noted. No erythema. No pallor.  Onycholysis of right index fingeer. Tender to palpation.  Psychiatric:  Anxious. Depressed affect. Low energy.     Labs reviewed: Appointment on 04/15/2014  Component Date Value Ref Range Status  . Glucose  04/15/2014 81  65 - 99 mg/dL Final  . BUN 04/15/2014 12  8 - 27 mg/dL Final  . Creatinine, Ser 04/15/2014 0.67  0.57 - 1.00 mg/dL Final  . GFR calc non Af Amer 04/15/2014 84  >59 mL/min/1.73 Final  . GFR calc Af Amer 04/15/2014 97  >59 mL/min/1.73 Final  . BUN/Creatinine Ratio 04/15/2014 18  11 - 26 Final  . Sodium 04/15/2014 139  134 - 144 mmol/L Final  . Potassium 04/15/2014 3.9  3.5 - 5.2 mmol/L Final  . Chloride 04/15/2014 101  97 - 108 mmol/L Final  . CO2 04/15/2014 24  18 - 29 mmol/L Final  . Calcium 04/15/2014 9.3  8.7 - 10.3 mg/dL Final  . Total Protein 04/15/2014 6.4  6.0 - 8.5 g/dL Final  . Albumin 04/15/2014 4.0  3.5 - 4.8 g/dL Final  . Globulin, Total 04/15/2014 2.4  1.5 - 4.5 g/dL Final  . Albumin/Globulin Ratio 04/15/2014 1.7  1.1 - 2.5 Final  . BILIRUBIN TOTAL 04/15/2014 0.4  0.0 - 1.2 mg/dL Final  . Alkaline Phosphatase 04/15/2014 85  39 - 117 IU/L Final  . AST 04/15/2014 22  0 - 40 IU/L Final  . ALT 04/15/2014 15  0 - 32 IU/L Final  . Cholesterol, Total 04/15/2014 170  100 - 199 mg/dL Final  . Triglycerides 04/15/2014 66  0 - 149 mg/dL Final  . HDL 04/15/2014 73  >39 mg/dL  Final   Comment: According to ATP-III Guidelines, HDL-C >59 mg/dL is considered a negative risk factor for CHD.   Marland Kitchen VLDL Cholesterol Cal 04/15/2014 13  5 - 40 mg/dL Final  . LDL Calculated 04/15/2014 84  0 - 99 mg/dL Final  . Chol/HDL Ratio 04/15/2014 2.3  0.0 - 4.4 ratio units Final   Comment:                                   T. Chol/HDL Ratio                                             Men  Women                               1/2 Avg.Risk  3.4    3.3                                   Avg.Risk  5.0    4.4                                2X Avg.Risk  9.6    7.1                                3X Avg.Risk 23.4   11.0   . TSH 04/15/2014 1.740  0.450 - 4.500 uIU/mL Final  . WBC 04/15/2014 6.2  3.4 - 10.8 x10E3/uL Final  . RBC 04/15/2014 4.67  3.77 - 5.28 x10E6/uL Final  . Hemoglobin 04/15/2014 14.2  11.1 - 15.9 g/dL Final  . HCT 04/15/2014 41.3  34.0 - 46.6 %  Final  . MCV 04/15/2014 88  79 - 97 fL Final  . MCH 04/15/2014 30.4  26.6 - 33.0 pg Final  . MCHC 04/15/2014 34.4  31.5 - 35.7 g/dL Final  . RDW 04/15/2014 14.1  12.3 - 15.4 % Final  . Neutrophils Relative % 04/15/2014 61   Final  . Lymphs 04/15/2014 24   Final  . Monocytes 04/15/2014 11   Final  . Eos 04/15/2014 4   Final  . Basos 04/15/2014 0   Final  . Neutrophils Absolute 04/15/2014 3.7  1.4 - 7.0 x10E3/uL Final  . Lymphocytes Absolute 04/15/2014 1.5  0.7 - 3.1 x10E3/uL Final  . Monocytes Absolute 04/15/2014 0.7  0.1 - 0.9 x10E3/uL Final  . Eosinophils Absolute 04/15/2014 0.3  0.0 - 0.4 x10E3/uL Final  . Basophils Absolute 04/15/2014 0.0  0.0 - 0.2 x10E3/uL Final  . Immature Granulocytes 04/15/2014 0   Final  . Immature Grans (Abs) 04/15/2014 0.0  0.0 - 0.1 x10E3/uL Final     Assessment/Plan 1. Cough Persistent and related to her chronic bronchitis  2. Chronic bronchitis, unspecified chronic bronchitis type Chronic condition of variable intensity.  3. Hyperlipidemia Controlled  4. Hypothyroidism, unspecified  hypothyroidism type Compensated  5. Malaise and fatigue Unchanged  6. Depression Patient refuses counseling or medication  7. Aneurysm of iliac artery Plan follow-up ultrasound  8. Barrett's esophagus Continue AcipHex  9. Cystitis Resolved  10. Frequency of urination Patient does not want more medication at this time.  11. Gastroesophageal reflux disease with esophagitis Continue AcipHex  12. Abnormal bone density screening - DG Bone Density; Future

## 2014-04-24 ENCOUNTER — Encounter: Payer: Self-pay | Admitting: Internal Medicine

## 2014-04-24 ENCOUNTER — Ambulatory Visit (INDEPENDENT_AMBULATORY_CARE_PROVIDER_SITE_OTHER): Payer: Medicare Other | Admitting: Internal Medicine

## 2014-04-24 VITALS — BP 98/60 | HR 57 | Ht 69.0 in | Wt 134.0 lb

## 2014-04-24 DIAGNOSIS — I5022 Chronic systolic (congestive) heart failure: Secondary | ICD-10-CM

## 2014-04-24 DIAGNOSIS — Z45018 Encounter for adjustment and management of other part of cardiac pacemaker: Secondary | ICD-10-CM

## 2014-04-24 DIAGNOSIS — I429 Cardiomyopathy, unspecified: Secondary | ICD-10-CM

## 2014-04-24 DIAGNOSIS — F329 Major depressive disorder, single episode, unspecified: Secondary | ICD-10-CM | POA: Diagnosis not present

## 2014-04-24 DIAGNOSIS — I428 Other cardiomyopathies: Secondary | ICD-10-CM

## 2014-04-24 DIAGNOSIS — Z950 Presence of cardiac pacemaker: Secondary | ICD-10-CM | POA: Insufficient documentation

## 2014-04-24 DIAGNOSIS — F32A Depression, unspecified: Secondary | ICD-10-CM

## 2014-04-24 LAB — MDC_IDC_ENUM_SESS_TYPE_INCLINIC
Brady Statistic RA Percent Paced: 55 %
Brady Statistic RV Percent Paced: 98 %
Date Time Interrogation Session: 20160217050000
Implantable Pulse Generator Serial Number: 100543
Lead Channel Impedance Value: 647 Ohm
Lead Channel Pacing Threshold Amplitude: 0.7 V
Lead Channel Pacing Threshold Amplitude: 1.5 V
Lead Channel Pacing Threshold Amplitude: 3 V
Lead Channel Pacing Threshold Pulse Width: 0.4 ms
Lead Channel Sensing Intrinsic Amplitude: 16.7 mV
Lead Channel Sensing Intrinsic Amplitude: 7.4 mV
Lead Channel Setting Pacing Pulse Width: 1 ms
Lead Channel Setting Pacing Pulse Width: 1 ms
MDC IDC MSMT LEADCHNL LV PACING THRESHOLD PULSEWIDTH: 1 ms
MDC IDC MSMT LEADCHNL RA IMPEDANCE VALUE: 529 Ohm
MDC IDC MSMT LEADCHNL RA SENSING INTR AMPL: 2.2 mV
MDC IDC MSMT LEADCHNL RV IMPEDANCE VALUE: 404 Ohm
MDC IDC MSMT LEADCHNL RV PACING THRESHOLD PULSEWIDTH: 1 ms
MDC IDC SET LEADCHNL LV PACING AMPLITUDE: 2.5 V
MDC IDC SET LEADCHNL LV SENSING SENSITIVITY: 2.5 mV
MDC IDC SET LEADCHNL RA PACING AMPLITUDE: 2 V
MDC IDC SET LEADCHNL RV PACING AMPLITUDE: 3.5 V
MDC IDC SET LEADCHNL RV SENSING SENSITIVITY: 2.5 mV
Zone Setting Detection Interval: 375 ms

## 2014-04-24 NOTE — Assessment & Plan Note (Signed)
Her [employer] PPM is working normally. Will recheck in several months.

## 2014-04-24 NOTE — Progress Notes (Signed)
HPI [name] returns today for followup. She is a very pleasant 81 year old woman with a nonischemic cardiomyopathy, chronic systolic heart failure, left bundle branch block, status post biventricular pacemaker insertion. In the interim, she denies chest pain, shortness of breath, or peripheral edema. No syncope. She has had an extensive hospitalization for problems with a hernia which ultimately required 4 operations. She has residual pain in her abdominal area. No drainage, fever or chills. 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  She is anxious. Allergies  Allergen Reactions  . Hydrocodone Itching  . Cephalexin Itching and Swelling  . Doxycycline Other (See Comments)    Per pt: unknown  . Ofloxacin Other (See Comments)    Per pt: unknown  . Tape Other (See Comments)    Burns skin.   . Latex Rash    "If pt. Makes contact with or wearing"  . Neomycin Rash     Current Outpatient Prescriptions  Medication Sig Dispense Refill  . acetaminophen (TYLENOL) 500 MG tablet Take 500 mg by mouth every 6 (six) hours as needed for pain. For pain    . Ascorbic Acid (VITAMIN C) 1000 MG tablet Take 1,000 mg by mouth daily.    Marland Kitchen aspirin 81 MG tablet Take 81 mg by mouth daily. Takes bedtime daily. Instructed to stop x5 days prior.    . Biotin 5000 MCG TABS Take 1 tablet by mouth daily.    . Calcium Carbonate-Vitamin D (CALCIUM + D PO) Take 2 tablets by mouth daily.     . Cyanocobalamin (VITAMIN B 12 PO) Take 1 tablet by mouth daily.     Marland Kitchen Desvenlafaxine ER (PRISTIQ) 50 MG TB24 One daily to help nerves 90 tablet 4  . ESTRACE VAGINAL 0.1 MG/GM vaginal cream Place 1 g vaginally 2 (two) times a week.     . furosemide (LASIX) 20 MG tablet Take 20 mg by mouth daily as needed for fluid or edema.    . hydroxypropyl methylcellulose (ISOPTO TEARS) 2.5 % ophthalmic solution Place 1 drop into both eyes 2 (two) times daily as needed for dry eyes.    Marland Kitchen levothyroxine (SYNTHROID, LEVOTHROID) 112  MCG tablet Take 112 mcg by mouth daily before breakfast.    . Magnesium 250 MG TABS Take 1 tablet by mouth daily.     . metoprolol tartrate (LOPRESSOR) 25 MG tablet TAKE 1/2 TABLET BY MOUTH TWICE DAILY. 60 tablet 3  . Multiple Vitamin (MULTIVITAMIN WITH MINERALS) TABS Take 1 tablet by mouth daily.    . potassium chloride SA (K-DUR,KLOR-CON) 20 MEQ tablet Take 20 mEq by mouth daily as needed (only with lasix).    . RABEprazole (ACIPHEX) 20 MG tablet Take 20 mg by mouth daily.     . trandolapril (MAVIK) 2 MG tablet Take 2 mg by mouth 2 (two) times daily.    . [DISCONTINUED] omeprazole (PRILOSEC) 20 MG capsule Take 1 capsule (20 mg total) by mouth daily. 30 capsule 11   No current facility-administered medications for this visit.     Past Medical History  Diagnosis Date  . Nonischemic cardiomyopathy     EF initially 20%; Last measurement up to 50% per echo in August of 2012  . CHF (congestive heart failure)   . LBBB (left bundle branch block)   . Mitral regurgitation   . Melanoma     right arm  . Hypothyroidism   . Erosive gastritis 08/2004  . Hyperplastic polyps of stomach 06/2002  . Barrett's  esophagus 04/2008  . GERD (gastroesophageal reflux disease)   . Diverticulitis   . Anxiety disorder   . HTN (hypertension)   . ICD (implantable cardiac defibrillator) in place     BiV/ICD; s/p removal of ICD with insertion of BiV PPM 04/26/12  . Hair thinning   . Unspecified sinusitis (chronic)   . Unspecified chronic bronchitis   . Depressive disorder, not elsewhere classified   . Restless legs syndrome (RLS)   . Chronic airway obstruction, not elsewhere classified   . Benign neoplasm of colon   . Other and unspecified hyperlipidemia   . Unspecified nasal polyp   . Pain in joint, lower leg   . Synovial cyst of popliteal space   . Complication of anesthesia     slow to wake up  . Pacemaker 04/26/2012  . Sleep apnea   . Other and unspecified hyperlipidemia   . Dysthymic disorder   .  Palpitations     ROS:   All systems reviewed and negative except as noted in the HPI.   Past Surgical History  Procedure Laterality Date  . Defibrillator insertion      s/p removal of previously implanted BiV ICD and insertion of a new BiV pacemaker on 04/26/12  . Left arm surgery    . Abdominal hysterectomy  1076    endometriosis  . Right breast lumpectomy  1988    Dr. Marylene Buerger  . Appendectomy    . Tonsillectomy    . Right knee surgery      arthroscopy: Dr. Shellia Carwin  . Excision of wen  1984    Dr. Parks Ranger  . Excise mole of lip  2001    Dr. Larena Sox  . Inguinal hernia repair Right 09/26/2012    Procedure: RIGHT INGUINAL HERNIA REPAIR WITH MESH;  Surgeon: Odis Hollingshead, MD;  Location: [facility];  Service: General;  Laterality: Right;  . Insertion of mesh Right 09/26/2012    Procedure: INSERTION OF MESH;  Surgeon: Odis Hollingshead, MD;  Location: [place];  Service: General;  Laterality: Right;  . Inguinal hernia repair Right 12/25/2012    Procedure: explor right groin, small bowel rescection, tissue repair right femoral hernia;  Surgeon: Adin Hector, MD;  Location: [facility];  Service: General;  Laterality: Right;  . Laparoscopy N/A 01/04/2013    Procedure: Diagnostic Laparoscopy, exploratory laparotomy with small bowel resection, closure of right femoral hernia repair;  Surgeon: Madilyn Hook, DO;  Location: [facility];  Service: General;  Laterality: N/A;  . Femoral hernia repair  12/25/2012    Dr Dalbert Batman  . Femoral hernia repair  01/04/2013    Recurrent - Dr. Lilyan Punt  . Incisional hernia repair N/A 10/01/2013    Procedure: LAPAROSCOPIC INCISIONAL HERNIA;  Surgeon: Gayland Curry, MD;  Location: [facility];  Service: General;  Laterality: N/A;  . Insertion of mesh N/A 10/01/2013    Procedure: INSERTION OF MESH;  Surgeon: Gayland Curry, MD;  Location: [facility];  Service: General;  Laterality: N/A;  . Bi-ventricular pacemaker insertion N/A 04/26/2012    Procedure: BI-VENTRICULAR PACEMAKER  INSERTION (CRT-P);  Surgeon: Evans Lance, MD;  Location: [facility] CATH LAB;  Service: Cardiovascular;  Laterality: N/A;     Family History  Problem Relation Age of Onset  . Heart disease Other     maternal side  . Colon cancer Paternal Uncle   . Breast cancer Maternal Aunt   . Diabetes Neg Hx   . Stroke Father  History   Social History  . Marital Status: Single    Spouse Name: N/A  . Number of Children: 0  . Years of Education: N/A   Occupational History  . Retired   .     Social History Main Topics  . Smoking status: Former Smoker    Types: Cigarettes    Quit date: 12/22/1993  . Smokeless tobacco: Never Used     Comment: Does'nt reall year quit   . Alcohol Use: No  . Drug Use: No  . Sexual Activity: No   Other Topics Concern  . Not on file   Social History Narrative     BP 98/60 mmHg  Pulse 57  Ht 5\' 9"  (1.753 m)  Wt 134 lb (60.782 kg)  BMI 19.78 kg/m2  Physical Exam:  Well appearing 81 year old woman, NAD HEENT: Unremarkable Neck:  7 cm JVD, no thyromegally Lungs:  Clear with no wheezes, rales, or rhonchi. Well-healed pacemaker incision HEART:  Regular rate rhythm, no murmurs, no rubs, no clicks Abd:  soft, positive bowel sounds, no organomegally, no rebound, no guarding, hernia incisions are healing nicely. Ext:  2 plus pulses, no edema, no cyanosis, no clubbing Skin:  No rashes no nodules Neuro:  CN II through XII intact, motor grossly intact  ECG - normal sinus rhythm with AV sequential biventricular pacing  DEVICE  Normal device function.  See PaceArt for details.   Assess/Plan:

## 2014-04-24 NOTE — Assessment & Plan Note (Signed)
Her symptoms are improved but still present. No change in meds.

## 2014-04-24 NOTE — Patient Instructions (Addendum)
Your physician recommends that you continue on your current medications as directed. Please refer to the Current Medication list given to you today.  Your physician wants you to follow-up in: 6 months with device clinic.  You will receive a reminder letter in the mail two months in advance. If you don't receive a letter, please call our office to schedule the follow-up appointment.  Your physician wants you to follow-up in: 1 year with Dr. Taylor. You will receive a reminder letter in the mail two months in advance. If you don't receive a letter, please call our office to schedule the follow-up appointment.  

## 2014-04-24 NOTE — Assessment & Plan Note (Signed)
Her symptoms are class 2A. Will follow. No change in medical therapy.

## 2014-05-06 ENCOUNTER — Other Ambulatory Visit: Payer: Self-pay | Admitting: *Deleted

## 2014-05-06 DIAGNOSIS — L601 Onycholysis: Secondary | ICD-10-CM | POA: Diagnosis not present

## 2014-05-06 DIAGNOSIS — L821 Other seborrheic keratosis: Secondary | ICD-10-CM | POA: Diagnosis not present

## 2014-05-06 DIAGNOSIS — Z85820 Personal history of malignant melanoma of skin: Secondary | ICD-10-CM | POA: Diagnosis not present

## 2014-05-06 DIAGNOSIS — L814 Other melanin hyperpigmentation: Secondary | ICD-10-CM | POA: Diagnosis not present

## 2014-05-06 MED ORDER — TRANDOLAPRIL 2 MG PO TABS: 2.0000 mg | ORAL_TABLET | Freq: Two times a day (BID) | ORAL | Status: DC

## 2014-05-08 ENCOUNTER — Other Ambulatory Visit: Payer: Self-pay | Admitting: Internal Medicine

## 2014-05-13 ENCOUNTER — Other Ambulatory Visit: Payer: Self-pay | Admitting: *Deleted

## 2014-05-13 MED ORDER — RABEPRAZOLE SODIUM 20 MG PO TBEC: DELAYED_RELEASE_TABLET | ORAL | Status: DC

## 2014-05-13 NOTE — Telephone Encounter (Signed)
Patient Requested to be faxed to pharmacy. 

## 2014-05-14 ENCOUNTER — Telehealth: Payer: Self-pay | Admitting: *Deleted

## 2014-05-14 NOTE — Telephone Encounter (Signed)
Mammogram Order received from [facility] [phone]. Stamped and faxed back to [place] Fax: [phone]

## 2014-05-23 DIAGNOSIS — Z780 Asymptomatic menopausal state: Secondary | ICD-10-CM | POA: Diagnosis not present

## 2014-05-23 LAB — HM DEXA SCAN

## 2014-06-07 ENCOUNTER — Encounter: Payer: Self-pay | Admitting: *Deleted

## 2014-06-12 ENCOUNTER — Telehealth: Payer: Self-pay | Admitting: *Deleted

## 2014-06-12 NOTE — Telephone Encounter (Signed)
Received Bone Density Report from [facility] and per Dr. Bennie Hind. Called and informed patient and sent report for scanning.

## 2014-06-24 ENCOUNTER — Encounter: Payer: Self-pay | Admitting: Cardiology

## 2014-06-24 ENCOUNTER — Ambulatory Visit (INDEPENDENT_AMBULATORY_CARE_PROVIDER_SITE_OTHER): Payer: Medicare Other | Admitting: Cardiology

## 2014-06-24 VITALS — BP 102/56 | HR 60 | Ht 68.0 in | Wt 137.0 lb

## 2014-06-24 DIAGNOSIS — Z950 Presence of cardiac pacemaker: Secondary | ICD-10-CM | POA: Diagnosis not present

## 2014-06-24 DIAGNOSIS — I5022 Chronic systolic (congestive) heart failure: Secondary | ICD-10-CM | POA: Diagnosis not present

## 2014-06-24 NOTE — Progress Notes (Signed)
[name] Date of Birth: 10/17/1932 Medical Record #[id number]  History of Present Illness: [name] is seen today for follow up. She has multiple medical issues which include a cardiomyopathy, underlying BiV/ICD which resulted in improvement of her EF, LBBB, HTN, thyroid disease, melanoma, anxiety/depression and chronic systolic HF. EF was 55% by last Echo in 2/15 .   She had a difficult time with hernia surgery last year. She had a fourth surgery in July for correction of a ventral hernia. Following one of the surgeries she had a wide complex arrhythmia that was felt by Dr. Rayann Heman to be her intrinsic rhythm (over her Biv paced rhythm). This was felt to be related to increased adrenergic tone. She was managed with beta blocker and digoxin.   On follow up today she is doing well from a cardiac standpoint. No chest pain or SOB. No palpitations.. Still very anxious.    Current Outpatient Prescriptions  Medication Sig Dispense Refill  . acetaminophen (TYLENOL) 500 MG tablet Take 500 mg by mouth every 6 (six) hours as needed for pain. For pain    . Ascorbic Acid (VITAMIN C) 1000 MG tablet Take 1,000 mg by mouth daily.    Marland Kitchen aspirin 81 MG tablet Take 81 mg by mouth daily. Takes bedtime daily. Instructed to stop x5 days prior.    . Biotin 5000 MCG TABS Take 1 tablet by mouth daily.    . Calcium Carbonate-Vitamin D (CALCIUM + D PO) Take 2 tablets by mouth daily.     . Cyanocobalamin (VITAMIN B 12 PO) Take 1 tablet by mouth daily.     Marland Kitchen ESTRACE VAGINAL 0.1 MG/GM vaginal cream Place 1 g vaginally 2 (two) times a week.     . furosemide (LASIX) 20 MG tablet Take 20 mg by mouth daily as needed for fluid or edema.    . hydroxypropyl methylcellulose (ISOPTO TEARS) 2.5 % ophthalmic solution Place 1 drop into both eyes 2 (two) times daily as needed for dry eyes.    Marland Kitchen levothyroxine (SYNTHROID, LEVOTHROID) 112 MCG tablet Take 112 mcg by mouth daily before breakfast.    . Magnesium 250 MG TABS Take 1  tablet by mouth daily.     . metoprolol tartrate (LOPRESSOR) 25 MG tablet TAKE 1/2 TABLET BY MOUTH TWICE DAILY. 60 tablet 3  . Multiple Vitamin (MULTIVITAMIN WITH MINERALS) TABS Take 1 tablet by mouth daily.    . potassium chloride SA (K-DUR,KLOR-CON) 20 MEQ tablet Take 20 mEq by mouth daily as needed (only with lasix).    . RABEprazole (ACIPHEX) 20 MG tablet Take one tablet by mouth once daily for stomach 90 tablet 3  . SYNTHROID 112 MCG tablet TAKE 1 TABLET BY MOUTH EVERY DAY FOR THYROID SUPPLEMENT 90 tablet 3  . trandolapril (MAVIK) 2 MG tablet Take 1 tablet (2 mg total) by mouth 2 (two) times daily. 60 tablet 1  . [DISCONTINUED] omeprazole (PRILOSEC) 20 MG capsule Take 1 capsule (20 mg total) by mouth daily. 30 capsule 11   No current facility-administered medications for this visit.    Allergies  Allergen Reactions  . Hydrocodone Itching  . Cephalexin Itching and Swelling  . Doxycycline Other (See Comments)    Per pt: unknown  . Ofloxacin Other (See Comments)    Per pt: unknown  . Tape Other (See Comments)    Burns skin.   . Latex Rash    "If pt. Makes contact with or wearing"  . Neomycin Rash    Past  Medical History  Diagnosis Date  . Nonischemic cardiomyopathy     EF initially 20%; Last measurement up to 50% per echo in August of 2012  . CHF (congestive heart failure)   . LBBB (left bundle branch block)   . Mitral regurgitation   . Melanoma     right arm  . Hypothyroidism   . Erosive gastritis 08/2004  . Hyperplastic polyps of stomach 06/2002  . Barrett's esophagus 04/2008  . GERD (gastroesophageal reflux disease)   . Diverticulitis   . Anxiety disorder   . HTN (hypertension)   . ICD (implantable cardiac defibrillator) in place     BiV/ICD; s/p removal of ICD with insertion of BiV PPM 04/26/12  . Hair thinning   . Unspecified sinusitis (chronic)   . Unspecified chronic bronchitis   . Depressive disorder, not elsewhere classified   . Restless legs syndrome (RLS)    . Chronic airway obstruction, not elsewhere classified   . Benign neoplasm of colon   . Other and unspecified hyperlipidemia   . Unspecified nasal polyp   . Pain in joint, lower leg   . Synovial cyst of popliteal space   . Complication of anesthesia     slow to wake up  . Pacemaker 04/26/2012  . Sleep apnea   . Other and unspecified hyperlipidemia   . Dysthymic disorder   . Palpitations     Past Surgical History  Procedure Laterality Date  . Defibrillator insertion      s/p removal of previously implanted BiV ICD and insertion of a new BiV pacemaker on 04/26/12  . Left arm surgery    . Abdominal hysterectomy  1076    endometriosis  . Right breast lumpectomy  1988    Dr. Marylene Buerger  . Appendectomy    . Tonsillectomy    . Right knee surgery      arthroscopy: Dr. Shellia Carwin  . Excision of wen  1984    Dr. Parks Ranger  . Excise mole of lip  2001    Dr. Larena Sox  . Inguinal hernia repair Right 09/26/2012    Procedure: RIGHT INGUINAL HERNIA REPAIR WITH MESH;  Surgeon: Odis Hollingshead, MD;  Location: [facility];  Service: General;  Laterality: Right;  . Insertion of mesh Right 09/26/2012    Procedure: INSERTION OF MESH;  Surgeon: Odis Hollingshead, MD;  Location: [place];  Service: General;  Laterality: Right;  . Inguinal hernia repair Right 12/25/2012    Procedure: explor right groin, small bowel rescection, tissue repair right femoral hernia;  Surgeon: Adin Hector, MD;  Location: [facility];  Service: General;  Laterality: Right;  . Laparoscopy N/A 01/04/2013    Procedure: Diagnostic Laparoscopy, exploratory laparotomy with small bowel resection, closure of right femoral hernia repair;  Surgeon: Madilyn Hook, DO;  Location: [facility];  Service: General;  Laterality: N/A;  . Femoral hernia repair  12/25/2012    Dr Dalbert Batman  . Femoral hernia repair  01/04/2013    Recurrent - Dr. Lilyan Punt  . Incisional hernia repair N/A 10/01/2013    Procedure: LAPAROSCOPIC INCISIONAL HERNIA;  Surgeon: Gayland Curry, MD;  Location: [facility];  Service: General;  Laterality: N/A;  . Insertion of mesh N/A 10/01/2013    Procedure: INSERTION OF MESH;  Surgeon: Gayland Curry, MD;  Location: [facility];  Service: General;  Laterality: N/A;  . Bi-ventricular pacemaker insertion N/A 04/26/2012    Procedure: BI-VENTRICULAR PACEMAKER INSERTION (CRT-P);  Surgeon: Evans Lance, MD;  Location: [facility]  CATH LAB;  Service: Cardiovascular;  Laterality: N/A;    History  Smoking status  . Former Smoker  . Types: Cigarettes  . Quit date: 12/22/1993  Smokeless tobacco  . Never Used    Comment: Does'nt reall year quit     History  Alcohol Use No    Family History  Problem Relation Age of Onset  . Heart disease Other     maternal side  . Colon cancer Paternal Uncle   . Breast cancer Maternal Aunt   . Diabetes Neg Hx   . Stroke Father     Review of Systems: The review of systems is per the HPI.  All other systems were reviewed and are negative.  Physical Exam: BP 102/56 mmHg  Pulse 60  Ht 5\' 8"  (1.727 m)  Wt 137 lb (62.143 kg)  BMI 20.84 kg/m2 Patient is very pleasant and in no acute distress.Skin is warm and dry. Color is normal.  HEENT is unremarkable. Normocephalic/atraumatic. PERRL. Sclera are nonicteric. Neck is supple. No masses. No JVD. Lungs are clear. Cardiac exam shows a regular rate and rhythm. Normal S1-2. No gallop or murmur. Abdomen is soft. Extremities are without edema. Gait and ROM are intact. No gross neurologic deficits noted.  Wt Readings from Last 3 Encounters:  06/24/14 137 lb (62.143 kg)  04/24/14 134 lb (60.782 kg)  04/17/14 135 lb (61.236 kg)     LABORATORY DATA: Lab Results  Component Value Date   WBC 6.2 04/15/2014   HGB 14.2 04/15/2014   HCT 41.3 04/15/2014   PLT 202 10/02/2013   GLUCOSE 81 04/15/2014   CHOL 170 04/15/2014   TRIG 66 04/15/2014   HDL 73 04/15/2014   LDLCALC 84 04/15/2014   ALT 15 04/15/2014   AST 22 04/15/2014   NA 139 04/15/2014   K 3.9 04/15/2014    CL 101 04/15/2014   CREATININE 0.67 04/15/2014   BUN 12 04/15/2014   CO2 24 04/15/2014   TSH 1.740 04/15/2014   INR 1.08 09/19/2012    Recent ICD check showed biv pacing 98% of the time. 6 beat run of PAT .    Assessment / Plan: 1.  Cardiomyopathy with EF now normal - has BiV/ICD in place -  I think overall she is doing great.  Continue low dose metoprolol and ACEi. Will check repeat Echo. Follow up in 6 months.  2. NSVT. Follow up ICD/biV in device clinic  3. Small iliac aneurysm. 2.0 cm. I personally reviewed multiple CT scans dating back to 2006. She has aortic and iliac atherosclerosis with calcification. The small iliac aneurysm noted on the right is partially thrombosed and unchanged in appearance over multiple scans since 2006.

## 2014-06-24 NOTE — Patient Instructions (Signed)
Continue your current therapy  We will schedule you for an Echocardiogram   I will see you in 6 months   

## 2014-07-02 ENCOUNTER — Other Ambulatory Visit: Payer: Self-pay | Admitting: Obstetrics & Gynecology

## 2014-07-02 DIAGNOSIS — R350 Frequency of micturition: Secondary | ICD-10-CM | POA: Diagnosis not present

## 2014-07-02 DIAGNOSIS — Z6821 Body mass index (BMI) 21.0-21.9, adult: Secondary | ICD-10-CM | POA: Diagnosis not present

## 2014-07-02 DIAGNOSIS — Z124 Encounter for screening for malignant neoplasm of cervix: Secondary | ICD-10-CM | POA: Diagnosis not present

## 2014-07-02 DIAGNOSIS — B373 Candidiasis of vulva and vagina: Secondary | ICD-10-CM | POA: Diagnosis not present

## 2014-07-02 DIAGNOSIS — Z1272 Encounter for screening for malignant neoplasm of vagina: Secondary | ICD-10-CM | POA: Diagnosis not present

## 2014-07-03 LAB — CYTOLOGY - PAP

## 2014-07-10 ENCOUNTER — Ambulatory Visit (HOSPITAL_COMMUNITY): Payer: Federal, State, Local not specified - PPO

## 2014-07-11 ENCOUNTER — Ambulatory Visit (HOSPITAL_COMMUNITY)
Admission: RE | Admit: 2014-07-11 | Discharge: 2014-07-11 | Disposition: A | Discharge status: Home / Self Care | Payer: Medicare Other | Source: Ambulatory Visit | Attending: Cardiology | Admitting: Cardiology

## 2014-07-11 DIAGNOSIS — I5022 Chronic systolic (congestive) heart failure: Secondary | ICD-10-CM

## 2014-07-11 DIAGNOSIS — Z950 Presence of cardiac pacemaker: Secondary | ICD-10-CM

## 2014-07-17 ENCOUNTER — Encounter: Payer: Self-pay | Admitting: Internal Medicine

## 2014-07-17 ENCOUNTER — Ambulatory Visit (INDEPENDENT_AMBULATORY_CARE_PROVIDER_SITE_OTHER): Payer: Medicare Other | Admitting: Internal Medicine

## 2014-07-17 VITALS — BP 130/68 | HR 55 | Temp 97.3°F | Resp 20 | Ht 68.0 in | Wt 137.4 lb

## 2014-07-17 DIAGNOSIS — R350 Frequency of micturition: Secondary | ICD-10-CM | POA: Diagnosis not present

## 2014-07-17 DIAGNOSIS — E785 Hyperlipidemia, unspecified: Secondary | ICD-10-CM

## 2014-07-17 DIAGNOSIS — F32A Depression, unspecified: Secondary | ICD-10-CM

## 2014-07-17 DIAGNOSIS — J42 Unspecified chronic bronchitis: Secondary | ICD-10-CM

## 2014-07-17 DIAGNOSIS — I5022 Chronic systolic (congestive) heart failure: Secondary | ICD-10-CM | POA: Diagnosis not present

## 2014-07-17 DIAGNOSIS — K2970 Gastritis, unspecified, without bleeding: Secondary | ICD-10-CM | POA: Diagnosis not present

## 2014-07-17 DIAGNOSIS — K2990 Gastroduodenitis, unspecified, without bleeding: Secondary | ICD-10-CM

## 2014-07-17 DIAGNOSIS — R5381 Other malaise: Secondary | ICD-10-CM | POA: Diagnosis not present

## 2014-07-17 DIAGNOSIS — E039 Hypothyroidism, unspecified: Secondary | ICD-10-CM

## 2014-07-17 DIAGNOSIS — F329 Major depressive disorder, single episode, unspecified: Secondary | ICD-10-CM

## 2014-07-17 DIAGNOSIS — I420 Dilated cardiomyopathy: Secondary | ICD-10-CM

## 2014-07-17 DIAGNOSIS — F411 Generalized anxiety disorder: Secondary | ICD-10-CM | POA: Diagnosis not present

## 2014-07-17 DIAGNOSIS — R5383 Other fatigue: Secondary | ICD-10-CM

## 2014-07-17 DIAGNOSIS — I429 Cardiomyopathy, unspecified: Secondary | ICD-10-CM

## 2014-07-17 DIAGNOSIS — R059 Cough, unspecified: Secondary | ICD-10-CM

## 2014-07-17 DIAGNOSIS — R05 Cough: Secondary | ICD-10-CM | POA: Diagnosis not present

## 2014-07-17 MED ORDER — SERTRALINE HCL 25 MG PO TABS: ORAL_TABLET | ORAL | Status: DC

## 2014-07-17 MED ORDER — MIRABEGRON ER 25 MG PO TB24: ORAL_TABLET | ORAL | Status: DC

## 2014-07-17 MED ORDER — SULFAMETHOXAZOLE-TRIMETHOPRIM 800-160 MG PO TABS: ORAL_TABLET | ORAL | Status: DC

## 2014-07-17 NOTE — Progress Notes (Signed)
Patient ID: [name], female   DOB: 1932/06/06, 81 y.o.   MRN: [id number]    Facility  [facility]    Place of Service:   OFFICE    Allergies  Allergen Reactions  . Hydrocodone Itching  . Cephalexin Itching and Swelling  . Doxycycline Other (See Comments)    Per pt: unknown  . Ofloxacin Other (See Comments)    Per pt: unknown  . Tape Other (See Comments)    Burns skin.   . Latex Rash    "If pt. Makes contact with or wearing"  . Neomycin Rash    Chief Complaint  Patient presents with  . Follow-up    3 month follow-up    HPI:   Multiple complaints today.  Cough is still present. She saw Dr. Radene Journey and November 2015. He gave her amoxicillin and then repeated this in January 2016. She questions whether the cough is now due more to allergy. There has been no fever or chills. She would like to try taking a prescription of Septra. This seemed to help her clear her head and sinuses in the past.  Chronic systolic heart failure: Overall her cardiac status has improved over the last few years. She still worries a lot about her heart and has palpitations.  Complains of pain in her head. But is unable to describe it very well. Has other nonspecific complaints of feeling shaky sometimes. She also has hot flashes from time to time.  Chronic bronchitis, unspecified chronic bronchitis type: Most likely contributes to her persistence of cough  Gastritis and gastroduodenitis: Episode of stomach burning about 5-6 days ago. Hurt so bad that she felt she might pass out. Was hurting around her waist at that time.  Depression: Complaints of fatigue. Constantly worries about multiple minor symptoms. Is now worried about her boyfriend who has started calling her daily complaining of a variety of ailments.  Hyperlipidemia: Controlled  Hypothyroidism, unspecified hypothyroidism type: Last TSH normal  Malaise and fatigue: Potentially multifactorial, but I believe most of this is related to  her depression and anxiety.  Generalized anxiety disorder: Unchanged. Multiple chronic neurotic worries.  Frequency of urination: Complaints of having to void up to 4 times nightly.  Bone density was done 05/23/2014. It was reported as normal. She was somewhat she can do to improve things. She can't believe she is normal.  Complains of her feet feeling horrible for the last 5-6 days.    Medications: Patient's Medications  New Prescriptions   No medications on file  Previous Medications   ACETAMINOPHEN (TYLENOL) 500 MG TABLET    Take 500 mg by mouth every 6 (six) hours as needed for pain. For pain   ASCORBIC ACID (VITAMIN C) 1000 MG TABLET    Take 1,000 mg by mouth daily.   ASPIRIN 81 MG TABLET    Take 81 mg by mouth daily. Takes bedtime daily. Instructed to stop x5 days prior.   BIOTIN 5000 MCG TABS    Take 1 tablet by mouth daily.   CALCIUM CARBONATE-VITAMIN D (CALCIUM + D PO)    Take 2 tablets by mouth daily.    CYANOCOBALAMIN (VITAMIN B 12 PO)    Take 1 tablet by mouth daily.    ESTRACE VAGINAL 0.1 MG/GM VAGINAL CREAM    Place 1 g vaginally 2 (two) times a week.    FUROSEMIDE (LASIX) 20 MG TABLET    Take 20 mg by mouth daily as needed for fluid or edema.  HYDROXYPROPYL METHYLCELLULOSE (ISOPTO TEARS) 2.5 % OPHTHALMIC SOLUTION    Place 1 drop into both eyes 2 (two) times daily as needed for dry eyes.   LEVOTHYROXINE (SYNTHROID, LEVOTHROID) 112 MCG TABLET    Take 112 mcg by mouth daily before breakfast.   MAGNESIUM 250 MG TABS    Take 1 tablet by mouth daily.    METOPROLOL TARTRATE (LOPRESSOR) 25 MG TABLET    TAKE 1/2 TABLET BY MOUTH TWICE DAILY.   MULTIPLE VITAMIN (MULTIVITAMIN WITH MINERALS) TABS    Take 1 tablet by mouth daily.   POTASSIUM CHLORIDE SA (K-DUR,KLOR-CON) 20 MEQ TABLET    Take 20 mEq by mouth daily as needed (only with lasix).   RABEPRAZOLE (ACIPHEX) 20 MG TABLET    Take one tablet by mouth once daily for stomach   TRANDOLAPRIL (MAVIK) 2 MG TABLET    Take 1 tablet (2  mg total) by mouth 2 (two) times daily.  Modified Medications   No medications on file  Discontinued Medications   SYNTHROID 112 MCG TABLET    TAKE 1 TABLET BY MOUTH EVERY DAY FOR THYROID SUPPLEMENT     Review of Systems  Constitutional: Positive for appetite change, fatigue and unexpected weight change. Negative for fever and chills.  HENT: Positive for congestion, hearing loss, postnasal drip, rhinorrhea and sinus pressure. Negative for ear discharge, ear pain, facial swelling, nosebleeds, sneezing and tinnitus.   Eyes: Negative.   Respiratory: Positive for cough and shortness of breath. Negative for wheezing.   Cardiovascular: Positive for palpitations. Negative for chest pain and leg swelling.  Gastrointestinal: Negative for nausea, abdominal pain, diarrhea, blood in stool and abdominal distention.       Hx erosive gastritis, HH, Barrett's esophagus. Tender RLQ.  Endocrine: Negative.   Genitourinary:       Nocturia x 3. Vaginal discomfort. Uses hormone cream from GYN, Dr. Nori Riis  Musculoskeletal: Positive for back pain, arthralgias, neck pain and neck stiffness. Negative for myalgias, joint swelling and gait problem.  Skin: Negative for pallor, rash and wound.       S/P right herniorraphy. Right index finger has onycholysis distally and tenderness.  Neurological: Positive for dizziness, numbness and headaches. Negative for tremors, seizures, syncope and speech difficulty.  Hematological: Negative.   Psychiatric/Behavioral: Positive for dysphoric mood. The patient is nervous/anxious.     Filed Vitals:   07/17/14 1239  BP: 130/68  Pulse: 55  Temp: 97.3 F (36.3 C)  TempSrc: Oral  Resp: 20  Height: 5\' 8"  (1.727 m)  Weight: 137 lb 6.4 oz (62.324 kg)  SpO2: 93%   Body mass index is 20.9 kg/(m^2).  Physical Exam  Constitutional:  Frail, thin body. Losing weight.  HENT:  Right Ear: External ear normal.  Left Ear: External ear normal.  Mouth/Throat: Oropharynx is clear and  moist.  Nasal polyp  Eyes:  Corrective lensees  Neck: No JVD present. No tracheal deviation present. No thyromegaly present.  stiff  Cardiovascular: Normal rate and regular rhythm.  Exam reveals no gallop and no friction rub.   No murmur heard. AICD in left upper chest. Raynaud's disease.  Pulmonary/Chest: Effort normal. No respiratory distress. She has no wheezes. She has rales. She exhibits no tenderness.  Abdominal: Soft. Bowel sounds are normal. She exhibits no distension and no mass. Tenderness: RLQ.  Right inguinal area has scar from surgery.  Midline incisional seroma, previously thought to be a hernia, has resolved.  Musculoskeletal: Normal range of motion. She exhibits no edema.  Tender right  knee.   Lymphadenopathy:    She has no cervical adenopathy.  Neurological:  Reduced vibratory sensation in both feet.  Skin: No rash noted. No erythema. No pallor.  Onycholysis of right index fingeer. Tender to palpation.  Psychiatric:  Anxious. Depressed affect. Low energy.     Labs reviewed: Orders Only on 07/02/2014  Component Date Value Ref Range Status  . CYTOLOGY - PAP 07/02/2014 PAP RESULT   Final  Abstract on 06/07/2014  Component Date Value Ref Range Status  . HM Dexa Scan 05/23/2014 [facility] Bone Density   Final  Office Visit on 04/24/2014  Component Date Value Ref Range Status  . Date Time Interrogation Session 04/24/2014 [id number]   Final  . Implantable Pulse Generator Manufa* 04/24/2014 [employer]   Final  . Implantable Pulse Generator Model 04/24/2014 V173   Final  . Implantable Pulse Generator Serial* 04/24/2014 [id number]   Final  . Lead Channel Setting Sensing Sensi* 04/24/2014 2.5   Final  . Lead Channel Setting Sensing Adapt* 04/24/2014 Fixed Pacing   Final  . Lead Channel Setting Sensing Sensi* 04/24/2014 2.5   Final  . Lead Channel Setting Sensing Adapt* 04/24/2014 Fixed Pacing   Final  . Lead Channel Setting Pacing Amplit* 04/24/2014 2.0   Final  .  Lead Channel Setting Pacing Pulse * 04/24/2014 1.0   Final  . Lead Channel Setting Pacing Amplit* 04/24/2014 3.5   Final  . Lead Channel Setting Pacing Pulse * 04/24/2014 1.0   Final  . Lead Channel Setting Pacing Amplit* 04/24/2014 2.5   Final  . Lead Channel Setting Pacing Captur* 04/24/2014 Fixed Pacing   Final  . Zone Setting Type Category 04/24/2014 VT   Final  . Zone Setting Detection Interval 04/24/2014 375.0   Final  . Lead Channel Impedance Value 04/24/2014 647.0   Final  . Lead Channel Sensing Intrinsic Amp* 04/24/2014 7.4   Final  . Lead Channel Pacing Threshold Ampl* 04/24/2014 1.5000   Final  . Lead Channel Pacing Threshold Puls* 04/24/2014 1.0   Final  . Lead Channel Impedance Value 04/24/2014 529.0   Final  . Lead Channel Sensing Intrinsic Amp* 04/24/2014 2.2   Final  . Lead Channel Pacing Threshold Ampl* 04/24/2014 0.7000   Final  . Lead Channel Pacing Threshold Puls* 04/24/2014 0.4   Final  . Lead Channel Impedance Value 04/24/2014 404.0   Final  . Lead Channel Sensing Intrinsic Amp* 04/24/2014 16.7   Final  . Lead Channel Pacing Threshold Ampl* 04/24/2014 3.0000   Final  . Lead Channel Pacing Threshold Puls* 04/24/2014 1.0   Final  . Battery Status 04/24/2014 BOS   Final  . Loletha Grayer Statistic RA Percent Paced 04/24/2014 55   Final  . Loletha Grayer Statistic RV Percent Paced 04/24/2014 98   Final  . Eval Rhythm 04/24/2014 SR@60    Final  . Miscellaneous Comment 04/24/2014    Final                   Value:CRT-P device check in clinic. Normal device function. RA & RV thresholds consistent, LV slightly elevated. Sensing, impedance consistent with previous measurements. Histograms appropriate for patient and level of activity. No mode switches or 2 NSVT---1  EGM, was SVT @6  beats. Patient bi-ventricularly pacing 98% of the time. Device programmed with appropriate safety margins---changed LV output from 2.2 to 2.5V. Estimated longevity 5.68yrs. ROV w/ device clinic in 89mo & w/ GT in 26mo.        Assessment/Plan  1. Chronic systolic  heart failure controlled  2. Chronic bronchitis, unspecified chronic bronchitis type - sulfamethoxazole-trimethoprim (BACTRIM DS,SEPTRA DS) 800-160 MG per tablet; One twice daily to treat infection  Dispense: 14 tablet; Refill: 1  3. Cough We will see if this will improve with the antibiotics prescribed above. I advised patient this may be a chronic issue that persist despite antibiotics.  4. Gastritis and gastroduodenitis improving - CBC With Differential; Future  5. Depression - sertraline (ZOLOFT) 25 MG tablet; One daily to help nerves and depression  Dispense: 30 tablet; Refill: 5  6. Hyperlipidemia - Lipid panel; Future  7. Hypothyroidism, unspecified hypothyroidism type compensated  8. Malaise and fatigue - CBC With Differential; Future - Comprehensive metabolic panel; Future - TSH; Future  9. Generalized anxiety disorder chronic and unchanged  10. Frequency of urination - mirabegron ER (MYRBETRIQ) 25 MG TB24 tablet; One daily to help bladder control  Dispense: 30 tablet; Refill: 5 Samples given  11. Cardiomyopathy, dilated, nonischemic improved

## 2014-07-18 DIAGNOSIS — I420 Dilated cardiomyopathy: Secondary | ICD-10-CM | POA: Insufficient documentation

## 2014-08-05 ENCOUNTER — Ambulatory Visit (INDEPENDENT_AMBULATORY_CARE_PROVIDER_SITE_OTHER): Payer: Medicare Other | Admitting: Family Medicine

## 2014-08-05 ENCOUNTER — Ambulatory Visit (INDEPENDENT_AMBULATORY_CARE_PROVIDER_SITE_OTHER): Payer: Medicare Other

## 2014-08-05 VITALS — BP 130/68 | HR 60 | Temp 98.6°F | Resp 17 | Ht 67.5 in | Wt 136.2 lb

## 2014-08-05 DIAGNOSIS — S5002XA Contusion of left elbow, initial encounter: Secondary | ICD-10-CM

## 2014-08-05 DIAGNOSIS — M542 Cervicalgia: Secondary | ICD-10-CM

## 2014-08-05 NOTE — Progress Notes (Signed)
[facility] [address], [place] [address] [phone]  Date:  08/05/2014   Name:  [name]   DOB:  1933-01-15   MRN:  [id number]  PCP:  Estill Dooms, MD    Chief Complaint: Elbow Injury   History of Present Illness:  [name] is a 81 y.o. very pleasant female patient who presents with the following:  She is a chronic pt of Dr. Elder Negus.  Her today for a fall that occurred yesterday in a grocery store parking lot. She tripped on something and fell onto her back.  She hit her head and "my whole body.'   No vision change, no nausea, no vomiting. She feels like she is ok but thought she might need to come in to be seen She skinned her elbows when she fell.  A friend helped her to dress the wounds.  The right seems to be ok but the left bled more and she is not sure if there is any more serious wound She is not having much of a HA- just a mild HA at this time.   She is not on any blood thinners except a baby asa.     Patient Active Problem List   Diagnosis Date Noted  . Cardiomyopathy, dilated, nonischemic 07/18/2014  . Biventricular cardiac pacemaker in situ 04/24/2014  . Cough 04/17/2014  . Aneurysm of iliac artery 04/17/2014  . UTI (urinary tract infection) 04/17/2014  . Cystitis 04/17/2014  . Incisional hernia, without obstruction or gangrene 11/21/2013  . Frequency of urination 11/21/2013  . Generalized bloating 07/27/2013  . Malaise and fatigue 05/08/2013  . Raynaud disease 05/08/2013  . Moderate malnutrition 12/24/2012  . Hypothyroidism   . Hair thinning   . Anxiety disorder   . Hyperlipidemia   . Unspecified sinusitis (chronic)   . Chronic bronchitis   . Depression   . Restless legs syndrome (RLS)   . COPD (chronic obstructive pulmonary disease)   . PVC (premature ventricular contraction) 06/08/2011  . GERD 02/03/2009  . CHRONIC SYSTOLIC HEART FAILURE 54/49/2010  . BARRETT'S ESOPHAGUS 05/29/2008  . Gastritis and  gastroduodenitis 05/29/2008  . SLEEP APNEA 04/08/2008    Past Medical History  Diagnosis Date  . Nonischemic cardiomyopathy     EF initially 20%; Last measurement up to 50% per echo in August of 2012  . CHF (congestive heart failure)   . LBBB (left bundle branch block)   . Mitral regurgitation   . Melanoma     right arm  . Hypothyroidism   . Erosive gastritis 08/2004  . Hyperplastic polyps of stomach 06/2002  . Barrett's esophagus 04/2008  . GERD (gastroesophageal reflux disease)   . Diverticulitis   . Anxiety disorder   . HTN (hypertension)   . ICD (implantable cardiac defibrillator) in place     BiV/ICD; s/p removal of ICD with insertion of BiV PPM 04/26/12  . Hair thinning   . Unspecified sinusitis (chronic)   . Unspecified chronic bronchitis   . Depressive disorder, not elsewhere classified   . Restless legs syndrome (RLS)   . Chronic airway obstruction, not elsewhere classified   . Benign neoplasm of colon   . Other and unspecified hyperlipidemia   . Unspecified nasal polyp   . Pain in joint, lower leg   . Synovial cyst of popliteal space   . Complication of anesthesia     slow to wake up  . Pacemaker 04/26/2012  . Sleep apnea   .  Other and unspecified hyperlipidemia   . Dysthymic disorder   . Palpitations     Past Surgical History  Procedure Laterality Date  . Defibrillator insertion      s/p removal of previously implanted BiV ICD and insertion of a new BiV pacemaker on 04/26/12  . Left arm surgery    . Abdominal hysterectomy  1076    endometriosis  . Right breast lumpectomy  1988    Dr. Marylene Buerger  . Appendectomy    . Tonsillectomy    . Right knee surgery      arthroscopy: Dr. Shellia Carwin  . Excision of wen  1984    Dr. Parks Ranger  . Excise mole of lip  2001    Dr. Larena Sox  . Inguinal hernia repair Right 09/26/2012    Procedure: RIGHT INGUINAL HERNIA REPAIR WITH MESH;  Surgeon: Odis Hollingshead, MD;  Location: [facility];  Service: General;  Laterality: Right;   . Insertion of mesh Right 09/26/2012    Procedure: INSERTION OF MESH;  Surgeon: Odis Hollingshead, MD;  Location: [facility];  Service: General;  Laterality: Right;  . Inguinal hernia repair Right 12/25/2012    Procedure: explor right groin, small bowel rescection, tissue repair right femoral hernia;  Surgeon: Adin Hector, MD;  Location: [facility];  Service: General;  Laterality: Right;  . Laparoscopy N/A 01/04/2013    Procedure: Diagnostic Laparoscopy, exploratory laparotomy with small bowel resection, closure of right femoral hernia repair;  Surgeon: Madilyn Hook, DO;  Location: [facility];  Service: General;  Laterality: N/A;  . Femoral hernia repair  12/25/2012    Dr Dalbert Batman  . Femoral hernia repair  01/04/2013    Recurrent - Dr. Lilyan Punt  . Incisional hernia repair N/A 10/01/2013    Procedure: LAPAROSCOPIC INCISIONAL HERNIA;  Surgeon: Gayland Curry, MD;  Location: [facility];  Service: General;  Laterality: N/A;  . Insertion of mesh N/A 10/01/2013    Procedure: INSERTION OF MESH;  Surgeon: Gayland Curry, MD;  Location: [facility];  Service: General;  Laterality: N/A;  . Bi-ventricular pacemaker insertion N/A 04/26/2012    Procedure: BI-VENTRICULAR PACEMAKER INSERTION (CRT-P);  Surgeon: Evans Lance, MD;  [facility];  Service: Cardiovascular;  Laterality: N/A;    History  Substance Use Topics  . Smoking status: Former Smoker    Types: Cigarettes    Quit date: 12/22/1993  . Smokeless tobacco: Never Used     Comment: Does'nt reall year quit   . Alcohol Use: No    Family History  Problem Relation Age of Onset  . Heart disease Other     maternal side  . Colon cancer Paternal Uncle   . Breast cancer Maternal Aunt   . Diabetes Neg Hx   . Stroke Father     Allergies  Allergen Reactions  . Hydrocodone Itching  . Cephalexin Itching and Swelling  . Doxycycline Other (See Comments)    Per pt: unknown  . Ofloxacin Other (See Comments)    Per pt: unknown  . Tape Other (See Comments)     Burns skin.   . Latex Rash    "If pt. Makes contact with or wearing"  . Neomycin Rash    Medication list has been reviewed and updated.  Current Outpatient Prescriptions on File Prior to Visit  Medication Sig Dispense Refill  . acetaminophen (TYLENOL) 500 MG tablet Take 500 mg by mouth every 6 (six) hours as needed for pain. For pain    .  Ascorbic Acid (VITAMIN C) 1000 MG tablet Take 1,000 mg by mouth daily.    Marland Kitchen aspirin 81 MG tablet Take 81 mg by mouth daily. Takes bedtime daily. Instructed to stop x5 days prior.    . Biotin 5000 MCG TABS Take 1 tablet by mouth daily.    . Calcium Carbonate-Vitamin D (CALCIUM + D PO) Take 2 tablets by mouth daily.     . Cyanocobalamin (VITAMIN B 12 PO) Take 1 tablet by mouth daily.     Marland Kitchen ESTRACE VAGINAL 0.1 MG/GM vaginal cream Place 1 g vaginally 2 (two) times a week.     . furosemide (LASIX) 20 MG tablet Take 20 mg by mouth daily as needed for fluid or edema.    . hydroxypropyl methylcellulose (ISOPTO TEARS) 2.5 % ophthalmic solution Place 1 drop into both eyes 2 (two) times daily as needed for dry eyes.    Marland Kitchen levothyroxine (SYNTHROID, LEVOTHROID) 112 MCG tablet Take 112 mcg by mouth daily before breakfast.    . Magnesium 250 MG TABS Take 1 tablet by mouth daily.     . metoprolol tartrate (LOPRESSOR) 25 MG tablet TAKE 1/2 TABLET BY MOUTH TWICE DAILY. 60 tablet 3  . Multiple Vitamin (MULTIVITAMIN WITH MINERALS) TABS Take 1 tablet by mouth daily.    . potassium chloride SA (K-DUR,KLOR-CON) 20 MEQ tablet Take 20 mEq by mouth daily as needed (only with lasix).    . RABEprazole (ACIPHEX) 20 MG tablet Take one tablet by mouth once daily for stomach 90 tablet 3  . trandolapril (MAVIK) 2 MG tablet Take 1 tablet (2 mg total) by mouth 2 (two) times daily. 60 tablet 1  . mirabegron ER (MYRBETRIQ) 25 MG TB24 tablet One daily to help bladder control (Patient not taking: Reported on 08/05/2014) 30 tablet 5  . sertraline (ZOLOFT) 25 MG tablet One daily to help nerves and  depression (Patient not taking: Reported on 08/05/2014) 30 tablet 5  . sulfamethoxazole-trimethoprim (BACTRIM DS,SEPTRA DS) 800-160 MG per tablet One twice daily to treat infection (Patient not taking: Reported on 08/05/2014) 14 tablet 1  . [DISCONTINUED] omeprazole (PRILOSEC) 20 MG capsule Take 1 capsule (20 mg total) by mouth daily. 30 capsule 11   No current facility-administered medications on file prior to visit.    Review of Systems:  As per HPI- otherwise negative.   Physical Examination: Filed Vitals:   08/05/14 1436  BP: 130/68  Pulse: 60  Temp: 98.6 F (37 C)  Resp: 17   Filed Vitals:   08/05/14 1436  Height: 5' 7.5" (1.715 m)  Weight: 136 lb 3.2 oz (61.78 kg)   Body mass index is 21 kg/(m^2). Ideal Body Weight: Weight in (lb) to have BMI = 25: 161.7  GEN: WDWN, NAD, Non-toxic, A & O x 3, well appearing older lady HEENT: Atraumatic, Normocephalic. Neck supple. No masses, No LAD.  Bilateral TM wnl, oropharynx normal.  PEERL,EOMI.   She has mild tenderness over the inferior cervical spine.   Ears and Nose: No external deformity. CV: RRR, No M/G/R. No JVD. No thrill. No extra heart sounds. PULM: CTA B, no wheezes, crackles, rhonchi. No retractions. No resp. distress. No accessory muscle use. EXTR: No c/c/e NEURO Normal gait.  PSYCH: Normally interactive. Conversant. Not depressed or anxious appearing.  Calm demeanor.  Right elbow with a small skinned area- cleaned and dressed. No pain with ROM of elbow Left elbow with a larger skin avulsion, no need for suture.  Wound is nickel sized.  Cleaned and  dressed wound. Some pain with ROM of the elbow- not severe but decided to do x-ray No laceration or hematoma of the scalp  [facility] reading (PRIMARY) by  Dr. Lorelei Pont. Cervical spine: significant degenerative change but no acute fracture or abnormal movement noted Left elbow:  Negative, no abnormal fat pad.   CERVICAL SPINE 4+ VIEWS  COMPARISON: None.  FINDINGS: Images  degraded by motion. Allowing for this, there is no evidence for fracture. There ismoderate loss of disc height at C6-C7 with small endplate osteophytes. Remaining disc spaces are well preserved. Oblique images are somewhat suboptimal. There is at least mild neural foraminal narrowing on the left at C6-C7 from uncovertebral spurring. There is significant facet degenerative change on the right at C5-C6 and C6-C7.  No subluxation with flexion or extension.  IMPRESSION: 1. No fracture or acute finding. No subluxation with flexion or Extension.  LEFT ELBOW - COMPLETE 3+ VIEW  COMPARISON: None.  FINDINGS: No fracture dislocation. No joint effusion. Mild dorsal medial soft tissue swelling.  IMPRESSION: No fracture or dislocation  Assessment and Plan: Contusion of left elbow, initial encounter - Plan: DG Elbow Complete Left  Pain, neck - Plan: DG Cervical Spine Complete  Fall yesterday with soreness in neck and contusions/ abrasions of both elbows Dressed elbows Discussed CT head and offered to do for pt to absolutely rule- out any bleed.  At this time she is feeling well, mild HA only. She declines CT but will seek care if not continuing to improve See patient instructions for more details.     Signed Lamar Blinks, MD

## 2014-08-05 NOTE — Patient Instructions (Signed)
Keep your elbow wounds clean and covered.  It is fine to use tylenol as needed If you have any severe headache or other concerning symptoms go to the ER or come back in to see [place]

## 2014-08-07 ENCOUNTER — Telehealth: Payer: Self-pay

## 2014-08-07 MED ORDER — TRANDOLAPRIL 2 MG PO TABS: 2.0000 mg | ORAL_TABLET | Freq: Two times a day (BID) | ORAL | Status: DC

## 2014-08-07 NOTE — Telephone Encounter (Signed)
Received a call from patient requesting refill for [facility].90 day refill sent to pharmacy.

## 2014-08-12 ENCOUNTER — Telehealth: Payer: Self-pay

## 2014-08-12 NOTE — Telephone Encounter (Signed)
Pt is wanting to talk with dr copland about her headaches since her fall

## 2014-08-12 NOTE — Telephone Encounter (Signed)
Pt called back and said she is going to see her regular PCP tomorrow to see what they want to do.  If she needs you, she will call back

## 2014-08-13 ENCOUNTER — Encounter: Payer: Self-pay | Admitting: Internal Medicine

## 2014-08-13 ENCOUNTER — Ambulatory Visit (INDEPENDENT_AMBULATORY_CARE_PROVIDER_SITE_OTHER): Payer: Medicare Other | Admitting: Internal Medicine

## 2014-08-13 VITALS — BP 106/62 | HR 60 | Temp 97.6°F | Wt 136.0 lb

## 2014-08-13 DIAGNOSIS — S0990XA Unspecified injury of head, initial encounter: Secondary | ICD-10-CM | POA: Diagnosis not present

## 2014-08-13 NOTE — Progress Notes (Signed)
Patient ID: [name], female   DOB: December 26, 1932, 81 y.o.   MRN: [id number]    Facility  [facility]    Place of Service:   OFFICE    Allergies  Allergen Reactions  . Hydrocodone Itching  . Cephalexin Itching and Swelling  . Doxycycline Other (See Comments)    Per pt: unknown  . Ofloxacin Other (See Comments)    Per pt: unknown  . Tape Other (See Comments)    Burns skin.   . Latex Rash    "If pt. Makes contact with or wearing"  . Neomycin Rash    Chief Complaint  Patient presents with  . Headache    fell 08/04/14 hit head. Went to [facility] 08/05/14. Was having headaches in temperal area, better today.    HPI:  Patient had a significant fall on 08/05/2014. She hit the back of her head and her tailbone. She went to the [facility] and [facility] for evaluation. She had significantly bad headaches for a few days, but believe she is getting better now. She never had dizziness, visual disturbance, limb weakness, or other neurologic symptoms. She presents today for reevaluation to be sure that nothing bad happened. The previous doctor at urgent care offered a CT scan of the brain, but she did not feel she needed it at that time. She wants reassurance that she made the correct decision.  Medications: Patient's Medications  New Prescriptions   No medications on file  Previous Medications   ACETAMINOPHEN (TYLENOL) 500 MG TABLET    Take 500 mg by mouth every 6 (six) hours as needed for pain. For pain   ASCORBIC ACID (VITAMIN C) 1000 MG TABLET    Take 1,000 mg by mouth daily.   ASPIRIN 81 MG TABLET    Take 81 mg by mouth daily. Takes bedtime daily. Instructed to stop x5 days prior.   BIOTIN 5000 MCG TABS    Take 1 tablet by mouth daily.   CALCIUM CARBONATE-VITAMIN D (CALCIUM + D PO)    Take 2 tablets by mouth daily.    CYANOCOBALAMIN (VITAMIN B 12 PO)    Take 1 tablet by mouth daily. Vitamin B12   ESTRACE VAGINAL 0.1 MG/GM VAGINAL CREAM    Place 1 g vaginally 2  (two) times a week.    FUROSEMIDE (LASIX) 20 MG TABLET    Take 20 mg by mouth daily as needed for fluid or edema.   HYDROXYPROPYL METHYLCELLULOSE (ISOPTO TEARS) 2.5 % OPHTHALMIC SOLUTION    Place 1 drop into both eyes 2 (two) times daily as needed for dry eyes.   LEVOTHYROXINE (SYNTHROID, LEVOTHROID) 112 MCG TABLET    Take 112 mcg by mouth daily before breakfast.   MAGNESIUM 250 MG TABS    Take 1 tablet by mouth daily.    METOPROLOL TARTRATE (LOPRESSOR) 25 MG TABLET    TAKE 1/2 TABLET BY MOUTH TWICE DAILY.   MIRABEGRON ER (MYRBETRIQ) 25 MG TB24 TABLET    One daily to help bladder control   MULTIPLE VITAMIN (MULTIVITAMIN WITH MINERALS) TABS    Take 1 tablet by mouth daily.   POTASSIUM CHLORIDE SA (K-DUR,KLOR-CON) 20 MEQ TABLET    Take 20 mEq by mouth daily as needed (only with lasix).   RABEPRAZOLE (ACIPHEX) 20 MG TABLET    Take one tablet by mouth once daily for stomach   SERTRALINE (ZOLOFT) 25 MG TABLET    One daily to help nerves and depression   TRANDOLAPRIL (  MAVIK) 2 MG TABLET    Take 1 tablet (2 mg total) by mouth 2 (two) times daily.  Modified Medications   No medications on file  Discontinued Medications   SULFAMETHOXAZOLE-TRIMETHOPRIM (BACTRIM DS,SEPTRA DS) 800-160 MG PER TABLET    One twice daily to treat infection     Review of Systems  Constitutional: Positive for appetite change, fatigue and unexpected weight change. Negative for fever and chills.  HENT: Positive for congestion, hearing loss, postnasal drip, rhinorrhea and sinus pressure. Negative for ear discharge, ear pain, facial swelling, nosebleeds, sneezing and tinnitus.   Eyes: Negative.   Respiratory: Positive for cough and shortness of breath. Negative for wheezing.   Cardiovascular: Positive for palpitations. Negative for chest pain and leg swelling.  Gastrointestinal: Negative for nausea, abdominal pain, diarrhea, blood in stool and abdominal distention.       Hx erosive gastritis, HH, Barrett's esophagus. Tender  RLQ.  Endocrine: Negative.   Genitourinary:       Nocturia x 3. Vaginal discomfort. Uses hormone cream from GYN, Dr. Nori Riis  Musculoskeletal: Positive for back pain, arthralgias, neck pain and neck stiffness. Negative for myalgias, joint swelling and gait problem.  Skin: Negative for pallor, rash and wound.       S/P right herniorraphy. Right index finger has onycholysis distally and tenderness.  Neurological: Positive for dizziness, numbness and headaches. Negative for tremors, seizures, syncope and speech difficulty.  Hematological: Negative.   Psychiatric/Behavioral: Positive for dysphoric mood. The patient is nervous/anxious.     Filed Vitals:   08/13/14 1517  BP: 106/62  Pulse: 60  Temp: 97.6 F (36.4 C)  TempSrc: Oral  Weight: 136 lb (61.689 kg)  SpO2: 95%   Body mass index is 20.97 kg/(m^2).  Physical Exam  Constitutional:  Frail, thin body. Losing weight.  HENT:  Right Ear: External ear normal.  Left Ear: External ear normal.  Mouth/Throat: Oropharynx is clear and moist.  Nasal polyp  Eyes:  Corrective lensees  Neck: No JVD present. No tracheal deviation present. No thyromegaly present.  stiff  Cardiovascular: Normal rate and regular rhythm.  Exam reveals no gallop and no friction rub.   No murmur heard. AICD in left upper chest. Raynaud's disease.  Pulmonary/Chest: Effort normal. No respiratory distress. She has no wheezes. She has rales. She exhibits no tenderness.  Abdominal: Soft. Bowel sounds are normal. She exhibits no distension and no mass. Tenderness: RLQ.  Right inguinal area has scar from surgery.  Midline incisional seroma, previously thought to be a hernia, has resolved.  Musculoskeletal: Normal range of motion. She exhibits no edema.  Mild tenderness in low back   Lymphadenopathy:    She has no cervical adenopathy.  Neurological:  Reduced vibratory sensation in both feet.  Skin: No rash noted. No erythema. No pallor.  Onycholysis of right index  fingeer. Tender to palpation.  Psychiatric:  Anxious. Depressed affect. Low energy.     Labs reviewed: Orders Only on 07/02/2014  Component Date Value Ref Range Status  . CYTOLOGY - P[facility] 07/02/2014 [facility] RESULT   Final  Abstract on 06/07/2014  Component Date Value Ref Range Status  . HM Dexa Scan 05/23/2014 [facility] Bone Density   Final     Assessment/Plan 1. Head trauma, initial encounter There is nothing on today's exam that suggests a high likelihood of significant brain trauma. She does not have any evidence of bruising in the back of the head. Pupils equal round and reactive to light and accommodation and there is no  visual disturbance. She was able to tandem walk well and there is no focal weakness.  I advised the patient that we would be glad to do the CT of the brain at some point if she should require new symptoms or if the anxiety about having a brain trauma increased. I find nothing to suggest that she has a subdural hematoma or other intracranial bleed.  Patient was given reprints from the medical literature regarding headaches post brain trauma and subdural hematoma.

## 2014-08-22 DIAGNOSIS — Z1231 Encounter for screening mammogram for malignant neoplasm of breast: Secondary | ICD-10-CM | POA: Diagnosis not present

## 2014-08-22 DIAGNOSIS — Z803 Family history of malignant neoplasm of breast: Secondary | ICD-10-CM | POA: Diagnosis not present

## 2014-08-22 LAB — HM MAMMOGRAPHY: HM Mammogram: NEGATIVE

## 2014-08-23 ENCOUNTER — Encounter: Payer: Self-pay | Admitting: *Deleted

## 2014-09-10 DIAGNOSIS — B373 Candidiasis of vulva and vagina: Secondary | ICD-10-CM | POA: Diagnosis not present

## 2014-09-10 DIAGNOSIS — N390 Urinary tract infection, site not specified: Secondary | ICD-10-CM | POA: Diagnosis not present

## 2014-10-18 ENCOUNTER — Other Ambulatory Visit: Payer: Medicare Other

## 2014-10-18 DIAGNOSIS — R5383 Other fatigue: Secondary | ICD-10-CM

## 2014-10-18 DIAGNOSIS — E785 Hyperlipidemia, unspecified: Secondary | ICD-10-CM | POA: Diagnosis not present

## 2014-10-18 DIAGNOSIS — K2970 Gastritis, unspecified, without bleeding: Secondary | ICD-10-CM | POA: Diagnosis not present

## 2014-10-18 DIAGNOSIS — R5381 Other malaise: Secondary | ICD-10-CM

## 2014-10-18 DIAGNOSIS — K2990 Gastroduodenitis, unspecified, without bleeding: Principal | ICD-10-CM

## 2014-10-19 LAB — CBC WITH DIFFERENTIAL
Basophils Absolute: 0 10*3/uL (ref 0.0–0.2)
Basos: 0 %
EOS (ABSOLUTE): 0.3 10*3/uL (ref 0.0–0.4)
Eos: 4 %
HEMATOCRIT: 39.7 % (ref 34.0–46.6)
HEMOGLOBIN: 13.8 g/dL (ref 11.1–15.9)
IMMATURE GRANS (ABS): 0 10*3/uL (ref 0.0–0.1)
Immature Granulocytes: 0 %
Lymphocytes Absolute: 1.3 10*3/uL (ref 0.7–3.1)
Lymphs: 21 %
MCH: 30.4 pg (ref 26.6–33.0)
MCHC: 34.8 g/dL (ref 31.5–35.7)
MCV: 87 fL (ref 79–97)
MONOCYTES: 11 %
Monocytes Absolute: 0.6 10*3/uL (ref 0.1–0.9)
NEUTROS ABS: 3.7 10*3/uL (ref 1.4–7.0)
NEUTROS PCT: 64 %
RBC: 4.54 x10E6/uL (ref 3.77–5.28)
RDW: 13.5 % (ref 12.3–15.4)
WBC: 5.9 10*3/uL (ref 3.4–10.8)

## 2014-10-19 LAB — COMPREHENSIVE METABOLIC PANEL WITH GFR
ALT: 15 IU/L (ref 0–32)
AST: 20 IU/L (ref 0–40)
Albumin/Globulin Ratio: 1.7 (ref 1.1–2.5)
Albumin: 4.2 g/dL (ref 3.5–4.7)
Alkaline Phosphatase: 86 IU/L (ref 39–117)
BUN/Creatinine Ratio: 23 (ref 11–26)
BUN: 18 mg/dL (ref 8–27)
Bilirubin Total: 0.5 mg/dL (ref 0.0–1.2)
CO2: 22 mmol/L (ref 18–29)
Calcium: 9.4 mg/dL (ref 8.7–10.3)
Chloride: 101 mmol/L (ref 97–108)
Creatinine, Ser: 0.77 mg/dL (ref 0.57–1.00)
GFR calc Af Amer: 84 mL/min/1.73
GFR calc non Af Amer: 73 mL/min/1.73
Globulin, Total: 2.5 g/dL (ref 1.5–4.5)
Glucose: 83 mg/dL (ref 65–99)
Potassium: 4.3 mmol/L (ref 3.5–5.2)
Sodium: 141 mmol/L (ref 134–144)
Total Protein: 6.7 g/dL (ref 6.0–8.5)

## 2014-10-19 LAB — LIPID PANEL
Chol/HDL Ratio: 2.4 ratio (ref 0.0–4.4)
Cholesterol, Total: 165 mg/dL (ref 100–199)
HDL: 70 mg/dL
LDL Calculated: 76 mg/dL (ref 0–99)
Triglycerides: 93 mg/dL (ref 0–149)
VLDL Cholesterol Cal: 19 mg/dL (ref 5–40)

## 2014-10-19 LAB — TSH: TSH: 1.2 u[IU]/mL (ref 0.450–4.500)

## 2014-10-23 ENCOUNTER — Ambulatory Visit (INDEPENDENT_AMBULATORY_CARE_PROVIDER_SITE_OTHER): Payer: Medicare Other | Admitting: Internal Medicine

## 2014-10-23 ENCOUNTER — Encounter: Payer: Self-pay | Admitting: Internal Medicine

## 2014-10-23 VITALS — BP 132/70 | HR 55 | Temp 97.7°F | Ht 68.0 in | Wt 136.8 lb

## 2014-10-23 DIAGNOSIS — T148 Other injury of unspecified body region: Secondary | ICD-10-CM

## 2014-10-23 DIAGNOSIS — R58 Hemorrhage, not elsewhere classified: Secondary | ICD-10-CM

## 2014-10-23 DIAGNOSIS — E785 Hyperlipidemia, unspecified: Secondary | ICD-10-CM | POA: Diagnosis not present

## 2014-10-23 DIAGNOSIS — T148XXA Other injury of unspecified body region, initial encounter: Secondary | ICD-10-CM | POA: Insufficient documentation

## 2014-10-23 DIAGNOSIS — R1011 Right upper quadrant pain: Secondary | ICD-10-CM | POA: Diagnosis not present

## 2014-10-23 DIAGNOSIS — J42 Unspecified chronic bronchitis: Secondary | ICD-10-CM

## 2014-10-23 DIAGNOSIS — E039 Hypothyroidism, unspecified: Secondary | ICD-10-CM | POA: Diagnosis not present

## 2014-10-23 DIAGNOSIS — F329 Major depressive disorder, single episode, unspecified: Secondary | ICD-10-CM

## 2014-10-23 DIAGNOSIS — K2100 Gastro-esophageal reflux disease with esophagitis, without bleeding: Secondary | ICD-10-CM

## 2014-10-23 DIAGNOSIS — F32A Depression, unspecified: Secondary | ICD-10-CM

## 2014-10-23 NOTE — Progress Notes (Signed)
Patient ID: [name], female   DOB: 22-Jan-1933, 81 y.o.   MRN: [id number]    Facility  [facility]    Place of Service:   OFFICE    Allergies  Allergen Reactions  . Hydrocodone Itching  . Cephalexin Itching and Swelling  . Doxycycline Other (See Comments)    Per pt: unknown  . Ofloxacin Other (See Comments)    Per pt: unknown  . Tape Other (See Comments)    Burns skin.   . Latex Rash    "If pt. Makes contact with or wearing"  . Neomycin Rash    Chief Complaint  Patient presents with  . Medical Management of Chronic Issues    3 month follow-up, discuss labs (copy printed)  . Arm Problem    Examine left elbow, raised area   . Immunizations    Discuss Prevnar 13     HPI:  [name] Circle on 08/04/14. Hit head. Fell flat on the back. Hit elbow. Left ulnar bursia swollen since then. It is no longer tender.  [name] Circle again on 10/12/14 into the tub. Hit the right side of her back. Now having continuing pains in the mornings. Better after she moves around. Large ecchymosis in the right back, left forearm, and right hand.  Neck cracks and pops when she turns it. New since her fall in May, 20216.  Depression - persistent problem. Patient feels tired, weak, and does not sleep well.  Chronic bronchitis, unspecified chronic bronchitis type - no recent fever or chills. Increasing small quantities of white to clear sputum.  Gastroesophageal reflux disease with esophagitis - occasional heartburn and indigestion.  Hyperlipidemia - well controlled  Hypothyroidism, unspecified hypothyroidism type - compensated  Pain, abdominal, RUQ -  nausea and RUQ pain since her fall on 10/12/14.   Medications: Patient's Medications  New Prescriptions   No medications on file  Previous Medications   ACETAMINOPHEN (TYLENOL) 500 MG TABLET    Take 500 mg by mouth every 6 (six) hours as needed for pain. For pain   ASCORBIC ACID (VITAMIN C) 1000 MG TABLET    Take 1,000 mg by mouth daily.   ASPIRIN 81 MG TABLET     Take 81 mg by mouth daily. Takes bedtime daily. Instructed to stop x5 days prior.   BIOTIN 5000 MCG TABS    Take 1 tablet by mouth daily.   CALCIUM CARBONATE-VITAMIN D (CALCIUM + D PO)    Take 2 tablets by mouth daily.    CYANOCOBALAMIN (VITAMIN B 12 PO)    Take 1 tablet by mouth daily. Vitamin B12   ESTRACE VAGINAL 0.1 MG/GM VAGINAL CREAM    Place 1 g vaginally 2 (two) times a week.    FUROSEMIDE (LASIX) 20 MG TABLET    Take 20 mg by mouth daily as needed for fluid or edema.   HYDROXYPROPYL METHYLCELLULOSE (ISOPTO TEARS) 2.5 % OPHTHALMIC SOLUTION    Place 1 drop into both eyes 2 (two) times daily as needed for dry eyes.   LEVOTHYROXINE (SYNTHROID, LEVOTHROID) 112 MCG TABLET    Take 112 mcg by mouth daily before breakfast.   MAGNESIUM 250 MG TABS    Take 1 tablet by mouth daily.    METOPROLOL TARTRATE (LOPRESSOR) 25 MG TABLET    TAKE 1/2 TABLET BY MOUTH TWICE DAILY.   MULTIPLE VITAMIN (MULTIVITAMIN WITH MINERALS) TABS    Take 1 tablet by mouth daily.   POTASSIUM CHLORIDE SA (K-DUR,KLOR-CON) 20 MEQ TABLET    Take 20 mEq by  mouth daily as needed (only with lasix).   RABEPRAZOLE (ACIPHEX) 20 MG TABLET    Take one tablet by mouth once daily for stomach   SERTRALINE (ZOLOFT) 25 MG TABLET    One daily to help nerves and depression   TRANDOLAPRIL (MAVIK) 2 MG TABLET    Take 1 tablet (2 mg total) by mouth 2 (two) times daily.  Modified Medications   No medications on file  Discontinued Medications   MIRABEGRON ER (MYRBETRIQ) 25 MG TB24 TABLET    One daily to help bladder control     Review of Systems  Constitutional: Positive for appetite change, fatigue and unexpected weight change. Negative for fever and chills.  HENT: Positive for congestion, hearing loss, postnasal drip, rhinorrhea and sinus pressure. Negative for ear discharge, ear pain, facial swelling, nosebleeds, sneezing and tinnitus.   Eyes: Negative.   Respiratory: Positive for cough and shortness of breath. Negative for wheezing.     Cardiovascular: Positive for palpitations. Negative for chest pain and leg swelling.  Gastrointestinal: Negative for nausea, abdominal pain, diarrhea, blood in stool and abdominal distention.       Hx erosive gastritis, HH, Barrett's esophagus. Tender RUQ.  Endocrine: Negative.   Genitourinary:       Nocturia x 3. Vaginal discomfort. Uses hormone cream from GYN, Dr. Nori [name]  Musculoskeletal: Positive for back pain, arthralgias, neck pain and neck stiffness. Negative for myalgias, joint swelling and gait problem.  Skin: Negative for pallor, rash and wound.       S/P right herniorraphy. Right index finger has onycholysis distally and tenderness.  Neurological: Positive for dizziness, numbness and headaches. Negative for tremors, seizures, syncope and speech difficulty.  Hematological: Negative.   Psychiatric/Behavioral: Positive for dysphoric mood. The patient is nervous/anxious.     Filed Vitals:   10/23/14 1217  BP: 132/70  Pulse: 55  Temp: 97.7 F (36.5 C)  TempSrc: Oral  Height: 5\' 8"  (1.727 m)  Weight: 136 lb 12.8 oz (62.052 kg)  SpO2: 99%   Body mass index is 20.81 kg/(m^2).  Physical Exam  Constitutional:  Frail, thin body. Losing weight.  HENT:  Right Ear: External ear normal.  Left Ear: External ear normal.  Mouth/Throat: Oropharynx is clear and moist.  Nasal polyp  Eyes:  Corrective lensees  Neck: No JVD present. No tracheal deviation present. No thyromegaly present.  stiff  Cardiovascular: Normal rate and regular rhythm.  Exam reveals no gallop and no friction rub.   No murmur heard. AICD in left upper chest. Raynaud's disease.  Pulmonary/Chest: Effort normal. No respiratory distress. She has no wheezes. She has rales. She exhibits no tenderness.  Abdominal: Soft. Bowel sounds are normal. She exhibits no distension and no mass. Tenderness: RLQ.  Right inguinal area has scar from surgery.  Midline incisional seroma, previously thought to be a hernia, has  resolved.  Musculoskeletal: Normal range of motion. She exhibits no edema.  Mild tenderness in low back . Left ulnar bursa is enlarged with fluid, but is not tender.  Lymphadenopathy:    She has no cervical adenopathy.  Neurological:  Reduced vibratory sensation in both feet.  Skin: No rash noted. No erythema. No pallor.  Onycholysis of right index fingeer. Tender to palpation.  Psychiatric:  Anxious. Depressed affect. Low energy.     Labs reviewed: Appointment on 10/18/2014  Component Date Value Ref Range Status  . WBC 10/18/2014 5.9  3.4 - 10.8 x10E3/uL Final  . RBC 10/18/2014 4.54  3.77 - 5.28 x10E6/uL Final  .  Hemoglobin 10/18/2014 13.8  11.1 - 15.9 g/dL Final  . Hematocrit 10/18/2014 39.7  34.0 - 46.6 % Final  . MCV 10/18/2014 87  79 - 97 fL Final  . MCH 10/18/2014 30.4  26.6 - 33.0 pg Final  . MCHC 10/18/2014 34.8  31.5 - 35.7 g/dL Final  . RDW 10/18/2014 13.5  12.3 - 15.4 % Final  . Neutrophils 10/18/2014 64   Final  . Lymphs 10/18/2014 21   Final  . Monocytes 10/18/2014 11   Final  . Eos 10/18/2014 4   Final  . Basos 10/18/2014 0   Final  . Neutrophils Absolute 10/18/2014 3.7  1.4 - 7.0 x10E3/uL Final  . Lymphocytes Absolute 10/18/2014 1.3  0.7 - 3.1 x10E3/uL Final  . Monocytes Absolute 10/18/2014 0.6  0.1 - 0.9 x10E3/uL Final  . EOS (ABSOLUTE) 10/18/2014 0.3  0.0 - 0.4 x10E3/uL Final  . Basophils Absolute 10/18/2014 0.0  0.0 - 0.2 x10E3/uL Final  . Immature Granulocytes 10/18/2014 0   Final  . Immature Grans (Abs) 10/18/2014 0.0  0.0 - 0.1 x10E3/uL Final  . Glucose 10/18/2014 83  65 - 99 mg/dL Final  . BUN 10/18/2014 18  8 - 27 mg/dL Final  . Creatinine, Ser 10/18/2014 0.77  0.57 - 1.00 mg/dL Final  . GFR calc non Af Amer 10/18/2014 73  >59 mL/min/1.73 Final  . GFR calc Af Amer 10/18/2014 84  >59 mL/min/1.73 Final  . BUN/Creatinine Ratio 10/18/2014 23  11 - 26 Final  . Sodium 10/18/2014 141  134 - 144 mmol/L Final  . Potassium 10/18/2014 4.3  3.5 - 5.2 mmol/L  Final  . Chloride 10/18/2014 101  97 - 108 mmol/L Final  . CO2 10/18/2014 22  18 - 29 mmol/L Final  . Calcium 10/18/2014 9.4  8.7 - 10.3 mg/dL Final  . Total Protein 10/18/2014 6.7  6.0 - 8.5 g/dL Final  . Albumin 10/18/2014 4.2  3.5 - 4.7 g/dL Final  . Globulin, Total 10/18/2014 2.5  1.5 - 4.5 g/dL Final  . Albumin/Globulin Ratio 10/18/2014 1.7  1.1 - 2.5 Final  . Bilirubin Total 10/18/2014 0.5  0.0 - 1.2 mg/dL Final  . Alkaline Phosphatase 10/18/2014 86  39 - 117 IU/L Final  . AST 10/18/2014 20  0 - 40 IU/L Final  . ALT 10/18/2014 15  0 - 32 IU/L Final  . TSH 10/18/2014 1.200  0.450 - 4.500 uIU/mL Final  . Cholesterol, Total 10/18/2014 165  100 - 199 mg/dL Final  . Triglycerides 10/18/2014 93  0 - 149 mg/dL Final  . HDL 10/18/2014 70  >39 mg/dL Final   Comment: According to ATP-III Guidelines, HDL-C >59 mg/dL is considered a negative risk factor for CHD.   Marland Kitchen VLDL Cholesterol Cal 10/18/2014 19  5 - 40 mg/dL Final  . LDL Calculated 10/18/2014 76  0 - 99 mg/dL Final  . Chol/HDL Ratio 10/18/2014 2.4  0.0 - 4.4 ratio units Final   Comment:                                   T. Chol/HDL Ratio                                             Men  Women  1/2 Avg.Risk  3.4    3.3                                   Avg.Risk  5.0    4.4                                2X Avg.Risk  9.6    7.1                                3X Avg.Risk 23.4   11.0   Abstract on 08/23/2014  Component Date Value Ref Range Status  . HM Mammogram 08/22/2014 [facility]-Negative no mammographic evidence of malignancy   Final   [facility] [phone]     Assessment/Plan  1. Depression I discussed medicating this problem again with the patient. This has been typical in the past, she does not want to start another medication.  2. Chronic bronchitis, unspecified chronic bronchitis type Compared to prior visits, her bronchitis seems to be under good control. She is coughing very little during  today's visit.  3. Gastroesophageal reflux disease with esophagitis Asymptomatic  4. Hyperlipidemia Controlled  5. Hypothyroidism, unspecified hypothyroidism type Compensated  6. Ecchymosis This will resolve on its own  7. Contusion right back I expect this will improve with time  8. Pain, abdominal, RUQ Because of the intensity of the discomfort and vomiting I think additional imaging studies are warranted. - CT Abdomen Pelvis W Contrast; Future

## 2014-10-28 ENCOUNTER — Ambulatory Visit
Admission: RE | Admit: 2014-10-28 | Discharge: 2014-10-28 | Disposition: A | Discharge status: Home / Self Care | Payer: Medicare Other | Source: Ambulatory Visit | Attending: Internal Medicine | Admitting: Internal Medicine

## 2014-10-28 DIAGNOSIS — M549 Dorsalgia, unspecified: Secondary | ICD-10-CM | POA: Diagnosis not present

## 2014-10-28 DIAGNOSIS — R1011 Right upper quadrant pain: Secondary | ICD-10-CM

## 2014-10-28 DIAGNOSIS — R109 Unspecified abdominal pain: Secondary | ICD-10-CM | POA: Diagnosis not present

## 2014-10-28 MED ORDER — IOPAMIDOL (ISOVUE-300) INJECTION 61%
100.0000 mL | Freq: Once | INTRAVENOUS | Status: AC | PRN
  Administered 2014-10-28: 100 mL via INTRAVENOUS

## 2014-10-31 ENCOUNTER — Telehealth: Payer: Self-pay | Admitting: Cardiology

## 2014-10-31 NOTE — Telephone Encounter (Signed)
Patient wants to speak with [name] regarding her next appointment with Dr. [name].

## 2014-11-01 NOTE — Telephone Encounter (Signed)
Returned call to patient 10/31/14.Appointment scheduled with Dr.Jordan 12/26/14 at 4:30 pm.

## 2014-11-06 ENCOUNTER — Encounter: Payer: Self-pay | Admitting: Internal Medicine

## 2014-11-06 ENCOUNTER — Ambulatory Visit (INDEPENDENT_AMBULATORY_CARE_PROVIDER_SITE_OTHER): Payer: Medicare Other | Admitting: *Deleted

## 2014-11-06 DIAGNOSIS — Z950 Presence of cardiac pacemaker: Secondary | ICD-10-CM | POA: Diagnosis not present

## 2014-11-06 DIAGNOSIS — I5022 Chronic systolic (congestive) heart failure: Secondary | ICD-10-CM | POA: Diagnosis not present

## 2014-11-06 LAB — CUP PACEART INCLINIC DEVICE CHECK
Brady Statistic RA Percent Paced: 48 %
Brady Statistic RV Percent Paced: 99 %
Date Time Interrogation Session: 20160831040000
Lead Channel Impedance Value: 549 Ohm
Lead Channel Impedance Value: 593 Ohm
Lead Channel Pacing Threshold Amplitude: 1.5 V
Lead Channel Pacing Threshold Amplitude: 2.9 V
Lead Channel Pacing Threshold Pulse Width: 1 ms
Lead Channel Sensing Intrinsic Amplitude: 7.3 mV
Lead Channel Setting Pacing Amplitude: 2 V
Lead Channel Setting Pacing Amplitude: 2.5 V
Lead Channel Setting Pacing Amplitude: 3.5 V
Lead Channel Setting Pacing Pulse Width: 1 ms
Lead Channel Setting Sensing Sensitivity: 2.5 mV
MDC IDC MSMT LEADCHNL RA PACING THRESHOLD AMPLITUDE: 0.7 V
MDC IDC MSMT LEADCHNL RA PACING THRESHOLD PULSEWIDTH: 0.4 ms
MDC IDC MSMT LEADCHNL RA SENSING INTR AMPL: 2.1 mV
MDC IDC MSMT LEADCHNL RV IMPEDANCE VALUE: 403 Ohm
MDC IDC MSMT LEADCHNL RV PACING THRESHOLD PULSEWIDTH: 1 ms
MDC IDC MSMT LEADCHNL RV SENSING INTR AMPL: 14 mV
MDC IDC PG SERIAL: 100543
MDC IDC SET LEADCHNL LV PACING PULSEWIDTH: 1 ms
MDC IDC SET LEADCHNL RV SENSING SENSITIVITY: 2.5 mV
MDC IDC SET ZONE DETECTION INTERVAL: 375 ms

## 2014-11-06 NOTE — Progress Notes (Signed)
CRT-P device check in clinic. Normal device function. Thresholds, sensing, impedance consistent with previous measurements. Histograms appropriate for patient and level of activity. No mode switches. 2 ventricular high rate episodes- both 1:1 conduction- longest 11 sec, pk V 162. Patient bi-ventricularly pacing 99% of the time. Device programmed with appropriate safety margins. Device heart failure diagnostics are within normal limits and stable over time. Estimated longevity 4.5 years. ROV with GT in February.

## 2014-11-19 ENCOUNTER — Ambulatory Visit (INDEPENDENT_AMBULATORY_CARE_PROVIDER_SITE_OTHER): Payer: Medicare Other | Admitting: Internal Medicine

## 2014-11-19 ENCOUNTER — Encounter: Payer: Self-pay | Admitting: Internal Medicine

## 2014-11-19 VITALS — BP 92/66 | HR 55 | Temp 97.3°F | Resp 20 | Ht 68.0 in | Wt 136.4 lb

## 2014-11-19 DIAGNOSIS — I959 Hypotension, unspecified: Secondary | ICD-10-CM | POA: Diagnosis not present

## 2014-11-19 DIAGNOSIS — I429 Cardiomyopathy, unspecified: Secondary | ICD-10-CM | POA: Diagnosis not present

## 2014-11-19 DIAGNOSIS — R1011 Right upper quadrant pain: Secondary | ICD-10-CM | POA: Diagnosis not present

## 2014-11-19 DIAGNOSIS — R05 Cough: Secondary | ICD-10-CM | POA: Diagnosis not present

## 2014-11-19 DIAGNOSIS — Z23 Encounter for immunization: Secondary | ICD-10-CM

## 2014-11-19 DIAGNOSIS — T148 Other injury of unspecified body region: Secondary | ICD-10-CM | POA: Diagnosis not present

## 2014-11-19 DIAGNOSIS — R58 Hemorrhage, not elsewhere classified: Secondary | ICD-10-CM

## 2014-11-19 DIAGNOSIS — I7300 Raynaud's syndrome without gangrene: Secondary | ICD-10-CM

## 2014-11-19 DIAGNOSIS — R059 Cough, unspecified: Secondary | ICD-10-CM

## 2014-11-19 DIAGNOSIS — I10 Essential (primary) hypertension: Secondary | ICD-10-CM | POA: Insufficient documentation

## 2014-11-19 DIAGNOSIS — T148XXA Other injury of unspecified body region, initial encounter: Secondary | ICD-10-CM

## 2014-11-19 DIAGNOSIS — I420 Dilated cardiomyopathy: Secondary | ICD-10-CM

## 2014-11-19 MED ORDER — TRANDOLAPRIL 2 MG PO TABS: ORAL_TABLET | ORAL | Status: DC

## 2014-11-19 NOTE — Patient Instructions (Signed)
Reduce trandolapril to once daily.  Speak to Dr. [name] about stopping Toprol.

## 2014-11-19 NOTE — Progress Notes (Signed)
Patient ID: [name], female   DOB: 1932-06-13, 81 y.o.   MRN: [id number]    Facility  [facility]    Place of Service:   OFFICE    Allergies  Allergen Reactions  . Hydrocodone Itching  . Cephalexin Itching and Swelling  . Doxycycline Other (See Comments)    Per pt: unknown  . Ofloxacin Other (See Comments)    Per pt: unknown  . Tape Other (See Comments)    Burns skin.   . Latex Rash    "If pt. Makes contact with or wearing"  . Neomycin Rash    Chief Complaint  Patient presents with  . Medical Management of Chronic Issues    3 week follow-up for medication management    HPI:  Hypotension, unspecified hypotension type - patient has had low blood pressures on the last 3 out of 4) visits. She complains of feeling quite fatigued.  Ecchymosis - right flank, resolving  Pain, abdominal, RUQ - resolved  Raynaud disease - fingertips turn blue and white. Aggravated by stress. Did not seem to occur until she was put on beta blockers.  Cough - history of chronic bronchitis  Contusion right back - improving  Cardiomyopathy, dilated, nonischemic - taking both trandolapril and metoprolol. Unfortunately these drugs are likely associated with hypotension and her complaints of fatigue.    Medications: Patient's Medications  New Prescriptions   No medications on file  Previous Medications   ACETAMINOPHEN (TYLENOL) 500 MG TABLET    Take 500 mg by mouth every 6 (six) hours as needed for pain. For pain   ASCORBIC ACID (VITAMIN C) 1000 MG TABLET    Take 1,000 mg by mouth daily.   ASPIRIN 81 MG TABLET    Take 81 mg by mouth daily. Takes bedtime daily. Instructed to stop x5 days prior.   BIOTIN 5000 MCG TABS    Take 1 tablet by mouth daily.   CALCIUM CARBONATE-VITAMIN D (CALCIUM + D PO)    Take 2 tablets by mouth daily.    CYANOCOBALAMIN (VITAMIN B 12 PO)    Take 1 tablet by mouth daily. Vitamin B12   ESTRACE VAGINAL 0.1 MG/GM VAGINAL CREAM    Place 1 g vaginally 2 (two) times a week.      FUROSEMIDE (LASIX) 20 MG TABLET    Take 20 mg by mouth daily as needed for fluid or edema.   HYDROXYPROPYL METHYLCELLULOSE (ISOPTO TEARS) 2.5 % OPHTHALMIC SOLUTION    Place 1 drop into both eyes 2 (two) times daily as needed for dry eyes.   LEVOTHYROXINE (SYNTHROID, LEVOTHROID) 112 MCG TABLET    Take 112 mcg by mouth daily before breakfast.   MAGNESIUM 250 MG TABS    Take 1 tablet by mouth daily.    METOPROLOL TARTRATE (LOPRESSOR) 25 MG TABLET    TAKE 1/2 TABLET BY MOUTH TWICE DAILY.   MULTIPLE VITAMIN (MULTIVITAMIN WITH MINERALS) TABS    Take 1 tablet by mouth daily.   POTASSIUM CHLORIDE SA (K-DUR,KLOR-CON) 20 MEQ TABLET    Take 20 mEq by mouth daily as needed (only with lasix).   RABEPRAZOLE (ACIPHEX) 20 MG TABLET    Take one tablet by mouth once daily for stomach   SERTRALINE (ZOLOFT) 25 MG TABLET    One daily to help nerves and depression   TRANDOLAPRIL (MAVIK) 2 MG TABLET    Take 1 tablet (2 mg total) by mouth 2 (two) times daily.  Modified Medications   No medications on file  Discontinued Medications   No medications on file     Review of Systems  Constitutional: Positive for appetite change, fatigue and unexpected weight change. Negative for fever and chills.  HENT: Positive for congestion, hearing loss, postnasal drip, rhinorrhea and sinus pressure. Negative for ear discharge, ear pain, facial swelling, nosebleeds, sneezing and tinnitus.   Eyes: Negative.   Respiratory: Positive for cough and shortness of breath. Negative for wheezing.   Cardiovascular: Positive for palpitations. Negative for chest pain and leg swelling.  Gastrointestinal: Negative for nausea, abdominal pain, diarrhea, blood in stool and abdominal distention.       Hx erosive gastritis, HH, Barrett's esophagus. Tender RUQ.  Endocrine: Negative.   Genitourinary:       Nocturia x 3. Vaginal discomfort. Uses hormone cream from GYN, Dr. Nori Riis  Musculoskeletal: Positive for back pain, arthralgias, neck pain and  neck stiffness. Negative for myalgias, joint swelling and gait problem.  Skin: Negative for pallor, rash and wound.       S/P right herniorraphy. Right index finger has onycholysis distally and tenderness.  Neurological: Positive for dizziness, numbness and headaches. Negative for tremors, seizures, syncope and speech difficulty.  Hematological: Negative.   Psychiatric/Behavioral: Positive for dysphoric mood. The patient is nervous/anxious.     Filed Vitals:   11/19/14 1422  BP: 92/66  Pulse: 55  Temp: 97.3 F (36.3 C)  TempSrc: Oral  Resp: 20  Height: $Remove'5\' 8"'NzlCwGR$  (1.727 m)  Weight: 136 lb 6.4 oz (61.871 kg)  SpO2: 96%   Body mass index is 20.74 kg/(m^2).  Physical Exam  Constitutional:  Frail, thin body. Losing weight.  HENT:  Right Ear: External ear normal.  Left Ear: External ear normal.  Mouth/Throat: Oropharynx is clear and moist.  Nasal polyp  Eyes:  Corrective lensees  Neck: No JVD present. No tracheal deviation present. No thyromegaly present.  stiff  Cardiovascular: Normal rate and regular rhythm.  Exam reveals no gallop and no friction rub.   No murmur heard. AICD in left upper chest. Raynaud's disease.  Pulmonary/Chest: Effort normal. No respiratory distress. She has no wheezes. She has rales. She exhibits no tenderness.  Abdominal: Soft. Bowel sounds are normal. She exhibits no distension and no mass. Tenderness: RLQ.  Right inguinal area has scar from surgery.  Midline incisional seroma, previously thought to be a hernia, has resolved.  Musculoskeletal: Normal range of motion. She exhibits no edema.  Mild tenderness in low back . Left ulnar bursa enlargement has resolved.  Lymphadenopathy:    She has no cervical adenopathy.  Neurological:  Reduced vibratory sensation in both feet.  Skin: No rash noted. No erythema. No pallor.  Onycholysis of right index fingeer. Tender to palpation.  Psychiatric:  Anxious. Depressed affect. Low energy.     Labs  reviewed: Lab Summary Latest Ref Rng 10/18/2014 04/15/2014  Hemoglobin 11.1 - 15.9 g/dL 13.8 14.2  Hematocrit 34.0 - 46.6 % 39.7 41.3  White count 3.4 - 10.8 x10E3/uL 5.9 6.2  Platelet count - (None) (None)  Sodium 134 - 144 mmol/L 141 139  Potassium 3.5 - 5.2 mmol/L 4.3 3.9  Calcium 8.7 - 10.3 mg/dL 9.4 9.3  Phosphorus - (None) (None)  Creatinine 0.57 - 1.00 mg/dL 0.77 0.67  AST 0 - 40 IU/L 20 22  Alk Phos 39 - 117 IU/L 86 85  Bilirubin 0.0 - 1.2 mg/dL 0.5 0.4  Glucose 65 - 99 mg/dL 83 81  Cholesterol - (None) (None)  HDL cholesterol >39 mg/dL 70 73  Triglycerides  0 - 149 mg/dL 93 66  LDL Direct - (None) (None)  LDL Calc 0 - 99 mg/dL 76 84  Total protein - (None) (None)  Albumin 3.5 - 4.7 g/dL 4.2 4.0   Lab Results  Component Value Date   TSH 1.200 10/18/2014   Lab Results  Component Value Date   BUN 18 10/18/2014   No results found for: HGBA1C  Ct Abdomen Pelvis W Contrast  10/28/2014   CLINICAL DATA:  Right-sided and lateral aspect abdominal and back pain  EXAM: CT ABDOMEN AND PELVIS WITH CONTRAST  TECHNIQUE: Multidetector CT imaging of the abdomen and pelvis was performed using the standard protocol following bolus administration of intravenous contrast.  CONTRAST:  12mL ISOVUE-300 IOPAMIDOL (ISOVUE-300) INJECTION 61%  COMPARISON:  12/04/2013.  FINDINGS: Lower chest: Cardiac pacer leads partly visualized. Lung bases are clear with the exception of thin curvilinear subpleural scarring.  Hepatobiliary: Liver and gallbladder appear unremarkable with the exception of a stable 1.6 cm area of subcapsular enhancement in the posterior segment right hepatic lobe, likely a flash filling hemangioma or vascular perfusion anomaly.  Pancreas: Normal  Spleen: Normal  Adrenals/Urinary Tract: Adrenal glands are normal. Right mid renal cortical 1.1 cm hypodense lesion measures 68 Hounsfield units, likely a hyperdense cyst given stability since prior exams dating back to 10/04/2008. Too small to  characterize left renal cortical lesions are stable.  Stomach/Bowel: Moderate stool burden. No bowel wall thickening or focal segmental dilatation is identified.  Vascular/Lymphatic: Moderate atheromatous aortic calcification noted. Infrarenal abdominal aortic ectasia measures 2.7 cm in AP maximal diameter image 35. Bilateral iliac arterial ectasia with extensive mural hypodense thrombus, maximal right internal iliac arterial diameter 1.4 cm image 58 and maximal left common iliac arterial ectasia measuring 1.6 cm image 52. No lymphadenopathy.  Other: No free fluid or lymphadenopathy. Uterus and ovaries not visualized, presumably surgically absent.  Musculoskeletal: Extensive multilevel disc degenerative change noted in the lumbar spine. Vertebral body heights are preserved.  IMPRESSION: No acute intra-abdominal or pelvic pathology. No significant interval change.   Electronically Signed   By: Conchita Paris M.D.   On: 10/28/2014 14:18   Assessment/Plan  1. Hypotension, unspecified hypotension type Reduce trandolapril from twice daily to once daily Patient advised to discuss with Dr. [name] whether she could safely discontinue metoprolol. - trandolapril (MAVIK) 2 MG tablet; One daily to strengthen the heart  Dispense: 90 tablet; Refill: 3  2. Ecchymosis Resolving  3. Pain, abdominal, RUQ Resolved  4. Raynaud disease Patient was advised that she should discuss with Dr. [name] whether she could safely discontinue metoprolol since it seems to be an aggravant.  5. Cough Chronic and related to COPD with chronic bronchitis  6. Contusion right back Resolving  7. Cardiomyopathy, dilated, nonischemic Doing very well. Last echocardiograms were normal. Because of issues of fatigue and low blood pressure I have reduced the Mavik. - trandolapril (MAVIK) 2 MG tablet; One daily to strengthen the heart  Dispense: 90 tablet; Refill: 3

## 2014-11-20 ENCOUNTER — Other Ambulatory Visit: Payer: Self-pay

## 2014-11-20 DIAGNOSIS — Z23 Encounter for immunization: Secondary | ICD-10-CM | POA: Diagnosis not present

## 2014-11-20 NOTE — Telephone Encounter (Signed)
Received a call from patient.She stated she saw PCP Dr.Green 11/19/14 and he decreased Mavik to 2 mg daily.B/P 92/66 pulse 55.Stated he wanted her to ask Dr.Jordan if she could stop toprol.Stated she is tired all the time.Stated she is depressed her partner for 40 years passed away.Advised Dr.Jordan out of office today.I will speak to him 11/21/14 and call her back.

## 2014-11-25 ENCOUNTER — Telehealth: Payer: Self-pay

## 2014-11-25 NOTE — Telephone Encounter (Signed)
Received a call from patient 11/21/14.She stated PCP Dr.Green decreased mavik to 2 mg daily due to low B/P 92/66 pulse 55.Stated he wanted her to ask Dr.Jordan if ok to stop toprol.Spoke to [place] he advised ok to take mavik 2 mg daily.She needs to continue toprol.

## 2014-12-05 DIAGNOSIS — L821 Other seborrheic keratosis: Secondary | ICD-10-CM | POA: Diagnosis not present

## 2014-12-05 DIAGNOSIS — L820 Inflamed seborrheic keratosis: Secondary | ICD-10-CM | POA: Diagnosis not present

## 2014-12-05 DIAGNOSIS — D1801 Hemangioma of skin and subcutaneous tissue: Secondary | ICD-10-CM | POA: Diagnosis not present

## 2014-12-05 DIAGNOSIS — Z85820 Personal history of malignant melanoma of skin: Secondary | ICD-10-CM | POA: Diagnosis not present

## 2014-12-05 DIAGNOSIS — L570 Actinic keratosis: Secondary | ICD-10-CM | POA: Diagnosis not present

## 2014-12-05 DIAGNOSIS — D485 Neoplasm of uncertain behavior of skin: Secondary | ICD-10-CM | POA: Diagnosis not present

## 2014-12-05 DIAGNOSIS — L812 Freckles: Secondary | ICD-10-CM | POA: Diagnosis not present

## 2014-12-26 ENCOUNTER — Ambulatory Visit (INDEPENDENT_AMBULATORY_CARE_PROVIDER_SITE_OTHER): Payer: Medicare Other | Admitting: Cardiology

## 2014-12-26 ENCOUNTER — Encounter: Payer: Self-pay | Admitting: Cardiology

## 2014-12-26 VITALS — BP 120/62 | HR 60 | Ht 68.0 in | Wt 130.9 lb

## 2014-12-26 DIAGNOSIS — I429 Cardiomyopathy, unspecified: Secondary | ICD-10-CM | POA: Diagnosis not present

## 2014-12-26 DIAGNOSIS — I5022 Chronic systolic (congestive) heart failure: Secondary | ICD-10-CM | POA: Diagnosis not present

## 2014-12-26 DIAGNOSIS — I420 Dilated cardiomyopathy: Secondary | ICD-10-CM

## 2014-12-26 DIAGNOSIS — E785 Hyperlipidemia, unspecified: Secondary | ICD-10-CM

## 2014-12-26 NOTE — Patient Instructions (Signed)
You may stop metoprolol  Continue your other therapy  I will see you in 6 months.

## 2014-12-27 NOTE — Progress Notes (Signed)
[name] Date of Birth: [birth year] Medical Record #[id number]  History of Present Illness: [name] is seen today for follow up. She has multiple medical issues which include a cardiomyopathy, underlying BiV/ICD which resulted in improvement of her EF, LBBB, HTN, thyroid disease, melanoma, anxiety/depression and chronic systolic HF. EF was 55% by last Echo in 2/15 .   She had a difficult time with hernia surgery in 2014. She had a fourth surgery in July 2015 for correction of a ventral hernia. Following one of the surgeries she had a wide complex arrhythmia that was felt by Dr. Rayann Heman to be her intrinsic rhythm (over her Biv paced rhythm). This was felt to be related to increased adrenergic tone. She was managed with beta blocker and digoxin. Digoxin was later discontinued.   On follow up today she is doing well from a cardiac standpoint. No chest pain or SOB. No palpitations.. Still very anxious. She was hypotensive and Dr. Nyoka Cowden reduced her ACEi. She does complain of her hands turning blue and being numb. Her long time friend Delfino Lovett died recently and she is have a hard time coping with this.    Current Outpatient Prescriptions  Medication Sig Dispense Refill  . acetaminophen (TYLENOL) 500 MG tablet Take 500 mg by mouth every 6 (six) hours as needed for pain. For pain    . Ascorbic Acid (VITAMIN C) 1000 MG tablet Take 1,000 mg by mouth daily.    Marland Kitchen aspirin 81 MG tablet Take 81 mg by mouth daily. Takes bedtime daily. Instructed to stop x5 days prior.    . Biotin 5000 MCG TABS Take 1 tablet by mouth daily.    . Calcium Carbonate-Vitamin D (CALCIUM + D PO) Take 2 tablets by mouth daily.     . Cyanocobalamin (VITAMIN B 12 PO) Take 1 tablet by mouth daily. Vitamin B12    . ESTRACE VAGINAL 0.1 MG/GM vaginal cream Place 1 g vaginally 2 (two) times a week.     . furosemide (LASIX) 20 MG tablet Take 20 mg by mouth daily as needed for fluid or edema.    . hydroxypropyl methylcellulose (ISOPTO  TEARS) 2.5 % ophthalmic solution Place 1 drop into both eyes 2 (two) times daily as needed for dry eyes.    Marland Kitchen levothyroxine (SYNTHROID, LEVOTHROID) 112 MCG tablet Take 112 mcg by mouth daily before breakfast.    . Magnesium 250 MG TABS Take 1 tablet by mouth daily.     . metoprolol tartrate (LOPRESSOR) 25 MG tablet TAKE 1/2 TABLET BY MOUTH TWICE DAILY. (Patient taking differently: TAKE 1/2 TABLET BY MOUTH DAILY.) 60 tablet 3  . Multiple Vitamin (MULTIVITAMIN WITH MINERALS) TABS Take 1 tablet by mouth daily.    . potassium chloride SA (K-DUR,KLOR-CON) 20 MEQ tablet Take 20 mEq by mouth daily as needed (only with lasix).    . RABEprazole (ACIPHEX) 20 MG tablet Take one tablet by mouth once daily for stomach 90 tablet 3  . sertraline (ZOLOFT) 25 MG tablet One daily to help nerves and depression 30 tablet 5  . trandolapril (MAVIK) 2 MG tablet One daily to strengthen the heart 90 tablet 3  . [DISCONTINUED] omeprazole (PRILOSEC) 20 MG capsule Take 1 capsule (20 mg total) by mouth daily. 30 capsule 11   No current facility-administered medications for this visit.    Allergies  Allergen Reactions  . Hydrocodone Itching  . Cephalexin Itching and Swelling  . Doxycycline Other (See Comments)    Per pt: unknown  .  Ofloxacin Other (See Comments)    Per pt: unknown  . Tape Other (See Comments)    Burns skin.   . Latex Rash    "If pt. Makes contact with or wearing"  . Neomycin Rash    Past Medical History  Diagnosis Date  . Nonischemic cardiomyopathy ([place])     EF initially 20%; Last measurement up to 50% per echo in August of 2012  . CHF (congestive heart failure) ([place])   . LBBB (left bundle branch block)   . Mitral regurgitation   . Melanoma ([place])     right arm  . Hypothyroidism   . Erosive gastritis 08/2004  . Hyperplastic polyps of stomach 06/2002  . Barrett's esophagus 04/2008  . GERD (gastroesophageal reflux disease)   . Diverticulitis   . Anxiety disorder   . HTN (hypertension)   .  ICD (implantable cardiac defibrillator) in place     BiV/ICD; s/p removal of ICD with insertion of BiV PPM 04/26/12  . Hair thinning   . Unspecified sinusitis (chronic)   . Unspecified chronic bronchitis ([place])   . Depressive disorder, not elsewhere classified   . Restless legs syndrome (RLS)   . Chronic airway obstruction, not elsewhere classified   . Benign neoplasm of colon   . Other and unspecified hyperlipidemia   . Unspecified nasal polyp   . Pain in joint, lower leg   . Synovial cyst of popliteal space   . Complication of anesthesia     slow to wake up  . Pacemaker 04/26/2012  . Sleep apnea   . Other and unspecified hyperlipidemia   . Dysthymic disorder   . Palpitations     Past Surgical History  Procedure Laterality Date  . Defibrillator insertion      s/p removal of previously implanted BiV ICD and insertion of a new BiV pacemaker on 04/26/12  . Left arm surgery    . Abdominal hysterectomy  1076    endometriosis  . Right breast lumpectomy  1988    Dr. Marylene Buerger  . Appendectomy    . Tonsillectomy    . Right knee surgery      arthroscopy: Dr. Shellia Carwin  . Excision of wen  1984    Dr. Parks Ranger  . Excise mole of lip  2001    Dr. Larena Sox  . Inguinal hernia repair Right 09/26/2012    Procedure: RIGHT INGUINAL HERNIA REPAIR WITH MESH;  Surgeon: Odis Hollingshead, MD;  Location: [facility];  Service: General;  Laterality: Right;  . Insertion of mesh Right 09/26/2012    Procedure: INSERTION OF MESH;  Surgeon: Odis Hollingshead, MD;  Location: [facility];  Service: General;  Laterality: Right;  . Inguinal hernia repair Right 12/25/2012    Procedure: explor right groin, small bowel rescection, tissue repair right femoral hernia;  Surgeon: Adin Hector, MD;  Location: [facility];  Service: General;  Laterality: Right;  . Laparoscopy N/A 01/04/2013    Procedure: Diagnostic Laparoscopy, exploratory laparotomy with small bowel resection, closure of right femoral hernia repair;  Surgeon:  Madilyn Hook, DO;  Location: [facility];  Service: General;  Laterality: N/A;  . Femoral hernia repair  12/25/2012    Dr Dalbert Batman  . Femoral hernia repair  01/04/2013    Recurrent - Dr. Lilyan Punt  . Incisional hernia repair N/A 10/01/2013    Procedure: LAPAROSCOPIC INCISIONAL HERNIA;  Surgeon: Gayland Curry, MD;  Location: [facility];  Service: General;  Laterality: N/A;  . Insertion of mesh N/A  10/01/2013    Procedure: INSERTION OF MESH;  Surgeon: Gayland Curry, MD;  Location: [facility];  Service: General;  Laterality: N/A;  . Bi-ventricular pacemaker insertion N/A 04/26/2012    Procedure: BI-VENTRICULAR PACEMAKER INSERTION (CRT-P);  Surgeon: Evans Lance, MD;  Location: [facility];  Service: Cardiovascular;  Laterality: N/A;    History  Smoking status  . Former Smoker  . Types: Cigarettes  . Quit date: 12/22/1993  Smokeless tobacco  . Never Used    Comment: Does'nt reall year quit     History  Alcohol Use No    Family History  Problem Relation Age of Onset  . Heart disease Other     maternal side  . Colon cancer Paternal Uncle   . Breast cancer Maternal Aunt   . Diabetes Neg Hx   . Stroke Father     Review of Systems: The review of systems is per the HPI.  She has fallen a couple of times and complains of increased weakness in back. All other systems were reviewed and are negative.  Physical Exam: BP 120/62 mmHg  Pulse 60  Ht 5\' 8"  (1.727 m)  Wt 59.376 kg (130 lb 14.4 oz)  BMI 19.91 kg/m2 Patient is very pleasant and in no acute distress.Skin is warm and dry. Color is normal.  HEENT is unremarkable. Normocephalic/atraumatic. PERRL. Sclera are nonicteric. Neck is supple. No masses. No JVD. Lungs are clear. Cardiac exam shows a regular rate and rhythm. Normal S1-2. No gallop or murmur. Abdomen is soft. Extremities are without edema. Gait and ROM are intact. No gross neurologic deficits noted.  Wt Readings from Last 3 Encounters:  12/26/14 59.376 kg (130 lb 14.4 oz)  11/19/14 61.871  kg (136 lb 6.4 oz)  10/23/14 62.052 kg (136 lb 12.8 oz)     LABORATORY DATA: Lab Results  Component Value Date   WBC 5.9 10/18/2014   HGB 14.2 04/15/2014   HCT 39.7 10/18/2014   PLT 202 10/02/2013   GLUCOSE 83 10/18/2014   CHOL 165 10/18/2014   TRIG 93 10/18/2014   HDL 70 10/18/2014   LDLCALC 76 10/18/2014   ALT 15 10/18/2014   AST 20 10/18/2014   NA 141 10/18/2014   K 4.3 10/18/2014   CL 101 10/18/2014   CREATININE 0.77 10/18/2014   BUN 18 10/18/2014   CO2 22 10/18/2014   TSH 1.200 10/18/2014   INR 1.08 09/19/2012    Recent ICD check showed biv pacing 99% of the time. short run of PAT .   Echo: 07/11/14:  Study Conclusions  - Left ventricle: The cavity size was normal. Wall thickness was normal. Systolic function was normal. The estimated ejection fraction was in the range of 50% to 55%. - Mitral valve: There was mild regurgitation. - Pericardium, extracardiac: A trivial pericardial effusion was identified.  Assessment / Plan: 1.  History of dialted Cardiomyopathy with chronic systolic CHF.  EF now low normal - has BiV/ICD in place -  I think overall she is doing great.  Continue lower dose ACEi. We discussed stopping metoprolol to see if this will help ? Raynaud's. She is only on 12.5 mg daily.   2. NSVT. Follow up ICD/biV in device clinic. Will monitor for any increased arrhythmia with stopping metoprolol.

## 2015-01-06 DIAGNOSIS — N390 Urinary tract infection, site not specified: Secondary | ICD-10-CM | POA: Diagnosis not present

## 2015-01-06 DIAGNOSIS — N760 Acute vaginitis: Secondary | ICD-10-CM | POA: Diagnosis not present

## 2015-01-06 DIAGNOSIS — R350 Frequency of micturition: Secondary | ICD-10-CM | POA: Diagnosis not present

## 2015-01-17 DIAGNOSIS — J310 Chronic rhinitis: Secondary | ICD-10-CM | POA: Diagnosis not present

## 2015-01-17 DIAGNOSIS — J381 Polyp of vocal cord and larynx: Secondary | ICD-10-CM | POA: Diagnosis not present

## 2015-01-17 DIAGNOSIS — J029 Acute pharyngitis, unspecified: Secondary | ICD-10-CM | POA: Diagnosis not present

## 2015-01-22 ENCOUNTER — Encounter: Payer: Self-pay | Admitting: Internal Medicine

## 2015-01-22 ENCOUNTER — Ambulatory Visit (INDEPENDENT_AMBULATORY_CARE_PROVIDER_SITE_OTHER): Payer: Medicare Other | Admitting: Internal Medicine

## 2015-01-22 VITALS — BP 116/70 | HR 61 | Temp 97.5°F | Ht 68.0 in | Wt 136.0 lb

## 2015-01-22 DIAGNOSIS — E039 Hypothyroidism, unspecified: Secondary | ICD-10-CM | POA: Diagnosis not present

## 2015-01-22 DIAGNOSIS — E785 Hyperlipidemia, unspecified: Secondary | ICD-10-CM | POA: Diagnosis not present

## 2015-01-22 DIAGNOSIS — K2100 Gastro-esophageal reflux disease with esophagitis, without bleeding: Secondary | ICD-10-CM

## 2015-01-22 DIAGNOSIS — R5381 Other malaise: Secondary | ICD-10-CM | POA: Diagnosis not present

## 2015-01-22 DIAGNOSIS — I429 Cardiomyopathy, unspecified: Secondary | ICD-10-CM

## 2015-01-22 DIAGNOSIS — I420 Dilated cardiomyopathy: Secondary | ICD-10-CM

## 2015-01-22 DIAGNOSIS — R5383 Other fatigue: Secondary | ICD-10-CM

## 2015-01-22 DIAGNOSIS — F411 Generalized anxiety disorder: Secondary | ICD-10-CM | POA: Diagnosis not present

## 2015-01-22 DIAGNOSIS — I959 Hypotension, unspecified: Secondary | ICD-10-CM

## 2015-01-22 DIAGNOSIS — J42 Unspecified chronic bronchitis: Secondary | ICD-10-CM

## 2015-01-22 DIAGNOSIS — K2990 Gastroduodenitis, unspecified, without bleeding: Secondary | ICD-10-CM | POA: Diagnosis not present

## 2015-01-22 DIAGNOSIS — J449 Chronic obstructive pulmonary disease, unspecified: Secondary | ICD-10-CM | POA: Diagnosis not present

## 2015-01-22 DIAGNOSIS — K2970 Gastritis, unspecified, without bleeding: Secondary | ICD-10-CM

## 2015-01-22 NOTE — Progress Notes (Signed)
Patient ID: [name], female   DOB: [birth year], 81 y.o.   MRN: [id number]    Facility  [facility]    Place of Service:   OFFICE    Allergies  Allergen Reactions  . Hydrocodone Itching  . Cephalexin Itching and Swelling  . Doxycycline Other (See Comments)    Per pt: unknown  . Ofloxacin Other (See Comments)    Per pt: unknown  . Tape Other (See Comments)    Burns skin.   . Latex Rash    "If pt. Makes contact with or wearing"  . Neomycin Rash    Chief Complaint  Patient presents with  . Medical Management of Chronic Issues    2 month follow-up, discuss labs (copy printed)     HPI:  Gastritis and gastroduodenitis - recurrent episodes of indigestion. Seems to be doing fairly well at the present time  Hypothyroidism, unspecified hypothyroidism type - compensated  Generalized anxiety disorder - stable  Chronic obstructive pulmonary disease, unspecified COPD type ([place]) - dyspnea with exertion  Malaise and fatigue - chronic complaints of loss of energy, exhaustion, not sleeping well  Cardiomyopathy, dilated, nonischemic (HCC) - stable on current medication  Hypotension, unspecified hypotension type - improved with reduction in medication. Dr. [name] said it was okay to discontinue Toprol  Hyperlipidemia - controlled  Chronic bronchitis, unspecified chronic bronchitis type ([place]) - persistent cough. Some sputum production. Some head drainage.    Medications: Patient's Medications  New Prescriptions   No medications on file  Previous Medications   ACETAMINOPHEN (TYLENOL) 500 MG TABLET    Take 500 mg by mouth every 6 (six) hours as needed for pain. For pain   ASCORBIC ACID (VITAMIN C) 1000 MG TABLET    Take 1,000 mg by mouth daily.   ASPIRIN 81 MG TABLET    Take 81 mg by mouth daily. Takes bedtime daily. Instructed to stop x5 days prior.   BIOTIN 5000 MCG TABS    Take 1 tablet by mouth daily.   CALCIUM CARBONATE-VITAMIN D (CALCIUM + D PO)    Take 2 tablets by mouth  daily.    CYANOCOBALAMIN (VITAMIN B 12 PO)    Take 1 tablet by mouth daily. Vitamin B12   ESTRACE VAGINAL 0.1 MG/GM VAGINAL CREAM    Place 1 g vaginally 2 (two) times a week.    FUROSEMIDE (LASIX) 20 MG TABLET    Take 20 mg by mouth daily as needed for fluid or edema.   HYDROXYPROPYL METHYLCELLULOSE (ISOPTO TEARS) 2.5 % OPHTHALMIC SOLUTION    Place 1 drop into both eyes 2 (two) times daily as needed for dry eyes.   LEVOTHYROXINE (SYNTHROID, LEVOTHROID) 112 MCG TABLET    Take 112 mcg by mouth daily before breakfast.   MAGNESIUM 250 MG TABS    Take 1 tablet by mouth daily.    METOPROLOL TARTRATE (LOPRESSOR) 25 MG TABLET    TAKE 1/2 TABLET BY MOUTH TWICE DAILY.   MULTIPLE VITAMIN (MULTIVITAMIN WITH MINERALS) TABS    Take 1 tablet by mouth daily.   POTASSIUM CHLORIDE SA (K-DUR,KLOR-CON) 20 MEQ TABLET    Take 20 mEq by mouth daily as needed (only with lasix).   RABEPRAZOLE (ACIPHEX) 20 MG TABLET    Take one tablet by mouth once daily for stomach   SERTRALINE (ZOLOFT) 25 MG TABLET    One daily to help nerves and depression   TRANDOLAPRIL (MAVIK) 2 MG TABLET    One daily to strengthen the heart  Modified  Medications   No medications on file  Discontinued Medications   No medications on file    Review of Systems  Constitutional: Positive for appetite change, fatigue and unexpected weight change. Negative for fever and chills.  HENT: Positive for congestion, hearing loss, postnasal drip, rhinorrhea and sinus pressure. Negative for ear discharge, ear pain, facial swelling, nosebleeds, sneezing and tinnitus.   Eyes: Negative.   Respiratory: Positive for cough and shortness of breath. Negative for wheezing.   Cardiovascular: Positive for palpitations. Negative for chest pain and leg swelling.  Gastrointestinal: Negative for nausea, abdominal pain, diarrhea, blood in stool and abdominal distention.       Hx erosive gastritis, HH, Barrett's esophagus. Tender RUQ.  Endocrine: Negative.   Genitourinary:        Nocturia x 3. Vaginal discomfort. Uses hormone cream from GYN, Dr. Nori Riis  Musculoskeletal: Positive for back pain, arthralgias, neck pain and neck stiffness. Negative for myalgias, joint swelling and gait problem.  Skin: Negative for pallor, rash and wound.       S/P right herniorraphy. Right index finger has onycholysis distally and tenderness.  Neurological: Positive for dizziness, numbness and headaches. Negative for tremors, seizures, syncope and speech difficulty.  Hematological: Negative.   Psychiatric/Behavioral: Positive for dysphoric mood. The patient is nervous/anxious.     Filed Vitals:   01/22/15 1245  BP: 116/70  Pulse: 61  Temp: 97.5 F (36.4 C)  TempSrc: Oral  Height: $Remove'5\' 8"'GAPjdow$  (1.727 m)  Weight: 136 lb (61.689 kg)  SpO2: 99%   Body mass index is 20.68 kg/(m^2).  Physical Exam  Constitutional:  Frail, thin body. Losing weight.  HENT:  Right Ear: External ear normal.  Left Ear: External ear normal.  Mouth/Throat: Oropharynx is clear and moist.  Nasal polyp  Eyes:  Corrective lensees  Neck: No JVD present. No tracheal deviation present. No thyromegaly present.  stiff  Cardiovascular: Normal rate and regular rhythm.  Exam reveals no gallop and no friction rub.   No murmur heard. AICD in left upper chest. Raynaud's disease.  Pulmonary/Chest: Effort normal. No respiratory distress. She has no wheezes. She has rales. She exhibits no tenderness.  Abdominal: Soft. Bowel sounds are normal. She exhibits no distension and no mass. Tenderness: RLQ.  Right inguinal area has scar from surgery.  Midline incisional seroma, previously thought to be a hernia, has resolved.  Musculoskeletal: Normal range of motion. She exhibits no edema.  Mild tenderness in low back . Left ulnar bursa enlargement has resolved.  Lymphadenopathy:    She has no cervical adenopathy.  Neurological:  Reduced vibratory sensation in both feet.  Skin: No rash noted. No erythema. No pallor.    Onycholysis of right index fingeer. Tender to palpation.  Psychiatric:  Anxious. Depressed affect. Low energy.    Labs reviewed: Lab Summary Latest Ref Rng 10/18/2014 04/15/2014  Hemoglobin 11.1 - 15.9 g/dL 13.8 14.2  Hematocrit 34.0 - 46.6 % 39.7 41.3  White count 3.4 - 10.8 x10E3/uL 5.9 6.2  Platelet count - (None) (None)  Sodium 134 - 144 mmol/L 141 139  Potassium 3.5 - 5.2 mmol/L 4.3 3.9  Calcium 8.7 - 10.3 mg/dL 9.4 9.3  Phosphorus - (None) (None)  Creatinine 0.57 - 1.00 mg/dL 0.77 0.67  AST 0 - 40 IU/L 20 22  Alk Phos 39 - 117 IU/L 86 85  Bilirubin 0.0 - 1.2 mg/dL 0.5 0.4  Glucose 65 - 99 mg/dL 83 81  Cholesterol - (None) (None)  HDL cholesterol >39 mg/dL 70 73  Triglycerides 0 - 149 mg/dL 93 66  LDL Direct - (None) (None)  LDL Calc 0 - 99 mg/dL 76 84  Total protein - (None) (None)  Albumin 3.5 - 4.7 g/dL 4.2 4.0   Lab Results  Component Value Date   TSH 1.200 10/18/2014   Lab Results  Component Value Date   BUN 18 10/18/2014   No results found for: HGBA1C  Assessment/Plan  1. Gastritis and gastroduodenitis Continue antacids and AcipHex  2. Hypothyroidism, unspecified hypothyroidism type Compensated unchanged - TSH; Future  3. Generalized anxiety disorder Unchanged  4. Chronic obstructive pulmonary disease, unspecified COPD type ([place]) Stable  5. Malaise and fatigue  Unchanged - Comprehensive metabolic panel; Future  6. Cardiomyopathy, dilated, nonischemic (HCC) Improved  7. Hypotension, unspecified hypotension type Improved off Toprol  8. Hyperlipidemia - Lipid panel; Future  9. Chronic bronchitis, unspecified chronic bronchitis type ([place]) Continue to monitor  10. Gastroesophageal reflux disease with esophagitis Continue antacids and AcipHex

## 2015-01-22 NOTE — Patient Instructions (Signed)
Try Delsym for cough. Take antihistamine daily. Try cetirizine (Zyrtec).

## 2015-01-23 ENCOUNTER — Ambulatory Visit: Payer: Federal, State, Local not specified - PPO | Admitting: Cardiology

## 2015-03-25 ENCOUNTER — Encounter: Payer: Self-pay | Admitting: Gastroenterology

## 2015-03-28 DIAGNOSIS — J018 Other acute sinusitis: Secondary | ICD-10-CM | POA: Diagnosis not present

## 2015-04-09 ENCOUNTER — Encounter: Payer: Self-pay | Admitting: Internal Medicine

## 2015-04-09 ENCOUNTER — Ambulatory Visit (INDEPENDENT_AMBULATORY_CARE_PROVIDER_SITE_OTHER): Payer: Medicare Other | Admitting: Internal Medicine

## 2015-04-09 VITALS — BP 114/60 | HR 56 | Ht 68.0 in | Wt 135.0 lb

## 2015-04-09 DIAGNOSIS — I5042 Chronic combined systolic (congestive) and diastolic (congestive) heart failure: Secondary | ICD-10-CM | POA: Diagnosis not present

## 2015-04-09 DIAGNOSIS — I429 Cardiomyopathy, unspecified: Secondary | ICD-10-CM | POA: Diagnosis not present

## 2015-04-09 DIAGNOSIS — Z950 Presence of cardiac pacemaker: Secondary | ICD-10-CM

## 2015-04-09 DIAGNOSIS — I428 Other cardiomyopathies: Secondary | ICD-10-CM

## 2015-04-09 LAB — CUP PACEART INCLINIC DEVICE CHECK
Brady Statistic RV Percent Paced: 99 %
Date Time Interrogation Session: 20170201050000
Implantable Lead Implant Date: 20070309
Implantable Lead Implant Date: 20070309
Implantable Lead Location: 753859
Implantable Lead Model: 158
Implantable Lead Model: 4087
Implantable Lead Model: 4543
Implantable Lead Serial Number: 121652
Lead Channel Impedance Value: 407 Ohm
Lead Channel Impedance Value: 536 Ohm
Lead Channel Pacing Threshold Amplitude: 0.7 V
Lead Channel Sensing Intrinsic Amplitude: 2.4 mV
Lead Channel Setting Pacing Amplitude: 2.5 V
Lead Channel Setting Pacing Amplitude: 3.5 V
Lead Channel Setting Pacing Pulse Width: 1 ms
Lead Channel Setting Pacing Pulse Width: 1 ms
MDC IDC LEAD IMPLANT DT: 20070309
MDC IDC LEAD LOCATION: 753858
MDC IDC LEAD LOCATION: 753860
MDC IDC LEAD SERIAL: 168416
MDC IDC LEAD SERIAL: 268502
MDC IDC MSMT LEADCHNL LV IMPEDANCE VALUE: 648 Ohm
MDC IDC MSMT LEADCHNL LV PACING THRESHOLD AMPLITUDE: 1.6 V
MDC IDC MSMT LEADCHNL LV PACING THRESHOLD PULSEWIDTH: 1 ms
MDC IDC MSMT LEADCHNL LV SENSING INTR AMPL: 6.8 mV
MDC IDC MSMT LEADCHNL RA PACING THRESHOLD PULSEWIDTH: 0.4 ms
MDC IDC MSMT LEADCHNL RV PACING THRESHOLD AMPLITUDE: 2.8 V
MDC IDC MSMT LEADCHNL RV PACING THRESHOLD PULSEWIDTH: 1 ms
MDC IDC MSMT LEADCHNL RV SENSING INTR AMPL: 14.6 mV
MDC IDC SET LEADCHNL LV SENSING SENSITIVITY: 2.5 mV
MDC IDC SET LEADCHNL RA PACING AMPLITUDE: 2 V
MDC IDC SET LEADCHNL RV SENSING SENSITIVITY: 2.5 mV
MDC IDC STAT BRADY RA PERCENT PACED: 45 %
Pulse Gen Serial Number: 100543

## 2015-04-09 NOTE — Progress Notes (Signed)
HPI Ms. Eklof returns today for followup. She is a very pleasant 82 year old woman with a nonischemic cardiomyopathy, chronic systolic heart failure, left bundle branch block, status post biventricular pacemaker insertion. In the interim, she denies chest pain, shortness of breath, or peripheral edema. No syncope. She has had an extensive hospitalization for problems with a hernia which ultimately required 4 operations. She has residual pain in her abdominal area. No drainage, fever or chills. 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  She is anxious. She recently lost her significant other of over 40 years. She has been depressed. She has occaisional palpitations.  Allergies  Allergen Reactions  . Hydrocodone Itching  . Cephalexin Itching and Swelling  . Doxycycline Other (See Comments)    Per pt: unknown  . Ofloxacin Other (See Comments)    Per pt: unknown  . Tape Other (See Comments)    Burns skin.   . Latex Rash    "If pt. Makes contact with or wearing"  . Neomycin Rash     Current Outpatient Prescriptions  Medication Sig Dispense Refill  . acetaminophen (TYLENOL) 500 MG tablet Take 500 mg by mouth every 6 (six) hours as needed for pain. For pain    . Ascorbic Acid (VITAMIN C) 1000 MG tablet Take 1,000 mg by mouth daily.    Marland Kitchen aspirin 81 MG tablet Take 81 mg by mouth daily.     . Biotin 5000 MCG TABS Take 1 tablet by mouth daily.    . Calcium Carbonate-Vitamin D (CALCIUM + D PO) Take 2 tablets by mouth daily.     . Cyanocobalamin (VITAMIN B 12 PO) Take 1 tablet by mouth daily. Vitamin B12    . ESTRACE VAGINAL 0.1 MG/GM vaginal cream Place 1 g vaginally 2 (two) times a week.     . furosemide (LASIX) 20 MG tablet Take 20 mg by mouth daily as needed for fluid or edema.    . hydroxypropyl methylcellulose (ISOPTO TEARS) 2.5 % ophthalmic solution Place 1 drop into both eyes 2 (two) times daily as needed for dry eyes.    Marland Kitchen levothyroxine (SYNTHROID, LEVOTHROID) 112 MCG  tablet Take 112 mcg by mouth daily before breakfast.    . Magnesium 250 MG TABS Take 1 tablet by mouth daily.     . Multiple Vitamin (MULTIVITAMIN WITH MINERALS) TABS Take 1 tablet by mouth daily.    . potassium chloride SA (K-DUR,KLOR-CON) 20 MEQ tablet Take 20 mEq by mouth daily as needed (only with lasix).    . RABEprazole (ACIPHEX) 20 MG tablet Take one tablet by mouth once daily for stomach 90 tablet 3  . sertraline (ZOLOFT) 25 MG tablet One daily to help nerves and depression 30 tablet 5  . trandolapril (MAVIK) 2 MG tablet One daily to strengthen the heart 90 tablet 3  . [DISCONTINUED] omeprazole (PRILOSEC) 20 MG capsule Take 1 capsule (20 mg total) by mouth daily. 30 capsule 11   No current facility-administered medications for this visit.     Past Medical History  Diagnosis Date  . Nonischemic cardiomyopathy ([place])     EF initially 20%; Last measurement up to 50% per echo in August of 2012  . CHF (congestive heart failure) ([place])   . LBBB (left bundle branch block)   . Mitral regurgitation   . Melanoma ([place])     right arm  . Hypothyroidism   . Erosive gastritis 08/2004  . Hyperplastic polyps of stomach 06/2002  . Barrett's esophagus  04/2008  . GERD (gastroesophageal reflux disease)   . Diverticulitis   . Anxiety disorder   . HTN (hypertension)   . ICD (implantable cardiac defibrillator) in place     BiV/ICD; s/p removal of ICD with insertion of BiV PPM 04/26/12  . Hair thinning   . Unspecified sinusitis (chronic)   . Unspecified chronic bronchitis ([place])   . Depressive disorder, not elsewhere classified   . Restless legs syndrome (RLS)   . Chronic airway obstruction, not elsewhere classified   . Benign neoplasm of colon   . Other and unspecified hyperlipidemia   . Unspecified nasal polyp   . Pain in joint, lower leg   . Synovial cyst of popliteal space   . Complication of anesthesia     slow to wake up  . Pacemaker 04/26/2012  . Sleep apnea   . Other and unspecified  hyperlipidemia   . Dysthymic disorder   . Palpitations     ROS:   All systems reviewed and negative except as noted in the HPI.   Past Surgical History  Procedure Laterality Date  . Defibrillator insertion      s/p removal of previously implanted BiV ICD and insertion of a new BiV pacemaker on 04/26/12  . Left arm surgery    . Abdominal hysterectomy  1076    endometriosis  . Right breast lumpectomy  1988    Dr. Marylene Buerger  . Appendectomy    . Tonsillectomy    . Right knee surgery      arthroscopy: Dr. Shellia Carwin  . Excision of wen  1984    Dr. Parks Ranger  . Excise mole of lip  2001    Dr. Larena Sox  . Inguinal hernia repair Right 09/26/2012    Procedure: RIGHT INGUINAL HERNIA REPAIR WITH MESH;  Surgeon: Odis Hollingshead, MD;  Location: [facility];  Service: General;  Laterality: Right;  . Insertion of mesh Right 09/26/2012    Procedure: INSERTION OF MESH;  Surgeon: Odis Hollingshead, MD;  Location: [facility];  Service: General;  Laterality: Right;  . Inguinal hernia repair Right 12/25/2012    Procedure: explor right groin, small bowel rescection, tissue repair right femoral hernia;  Surgeon: Adin Hector, MD;  Location: [facility];  Service: General;  Laterality: Right;  . Laparoscopy N/A 01/04/2013    Procedure: Diagnostic Laparoscopy, exploratory laparotomy with small bowel resection, closure of right femoral hernia repair;  Surgeon: Madilyn Hook, DO;  Location: [facility];  Service: General;  Laterality: N/A;  . Femoral hernia repair  12/25/2012    Dr Dalbert Batman  . Femoral hernia repair  01/04/2013    Recurrent - Dr. Lilyan Punt  . Incisional hernia repair N/A 10/01/2013    Procedure: LAPAROSCOPIC INCISIONAL HERNIA;  Surgeon: Gayland Curry, MD;  Location: [facility];  Service: General;  Laterality: N/A;  . Insertion of mesh N/A 10/01/2013    Procedure: INSERTION OF MESH;  Surgeon: Gayland Curry, MD;  Location: [facility];  Service: General;  Laterality: N/A;  . Bi-ventricular pacemaker insertion N/A  04/26/2012    Procedure: BI-VENTRICULAR PACEMAKER INSERTION (CRT-P);  Surgeon: Evans Lance, MD;  Location[facility];  Service: Cardiovascular;  Laterality: N/A;     Family History  Problem Relation Age of Onset  . Heart disease Other     maternal side  . Colon cancer Paternal Uncle   . Breast cancer Maternal Aunt   . Diabetes Neg Hx   . Stroke Father  Social History   Social History  . Marital Status: Single    Spouse Name: N/A  . Number of Children: 0  . Years of Education: N/A   Occupational History  . Retired   .     Social History Main Topics  . Smoking status: Former Smoker    Types: Cigarettes    Quit date: 12/22/1993  . Smokeless tobacco: Never Used     Comment: Does'nt reall year quit   . Alcohol Use: No  . Drug Use: No  . Sexual Activity: No   Other Topics Concern  . Not on file   Social History Narrative     BP 114/60 mmHg  Pulse 56  Ht 5\' 8"  (1.727 m)  Wt 135 lb (61.236 kg)  BMI 20.53 kg/m2  Physical Exam:  Well appearing 82 year old woman, NAD HEENT: Unremarkable Neck:  7 cm JVD, no thyromegally Lungs:  Clear with no wheezes, rales, or rhonchi. Well-healed pacemaker incision HEART:  Regular rate rhythm, no murmurs, no rubs, no clicks Abd:  soft, positive bowel sounds, no organomegally, no rebound, no guarding, hernia incisions are healing nicely. Ext:  2 plus pulses, no edema, no cyanosis, no clubbing Skin:  No rashes no nodules Neuro:  CN II through XII intact, motor grossly intact  ECG - normal sinus rhythm with AV sequential biventricular pacing  DEVICE  Normal device function.  See PaceArt for details.   Assess/Plan: 1. Chronic systolic/diastolic heart failure - her symptoms remain class 2. Her EF remains normalized or nearly so. SHe will continue her current meds.  2. Palpitations - she has had no atrial fib but does have PAC's and PVC's. She will avoid caffeine. 3. BiV PPM - her [employer] device is working normally.  Will recheck in several months.

## 2015-04-09 NOTE — Patient Instructions (Signed)
Medication Instructions:  Your physician recommends that you continue on your current medications as directed. Please refer to the Current Medication list given to you today.   Labwork: None ordered   Testing/Procedures: None ordered   Follow-Up: Your physician wants you to follow-up in: 6 months in the device clinic and 12 months with Dr Taylor You will receive a reminder letter in the mail two months in advance. If you don't receive a letter, please call our office to schedule the follow-up appointment.   Any Other Special Instructions Will Be Listed Below (If Applicable).     If you need a refill on your cardiac medications before your next appointment, please call your pharmacy.   

## 2015-04-24 DIAGNOSIS — H52223 Regular astigmatism, bilateral: Secondary | ICD-10-CM | POA: Diagnosis not present

## 2015-04-24 DIAGNOSIS — D4981 Neoplasm of unspecified behavior of retina and choroid: Secondary | ICD-10-CM | POA: Diagnosis not present

## 2015-04-24 DIAGNOSIS — H524 Presbyopia: Secondary | ICD-10-CM | POA: Diagnosis not present

## 2015-04-24 DIAGNOSIS — H04123 Dry eye syndrome of bilateral lacrimal glands: Secondary | ICD-10-CM | POA: Diagnosis not present

## 2015-05-01 ENCOUNTER — Encounter: Payer: Medicare Other | Admitting: Internal Medicine

## 2015-05-05 ENCOUNTER — Other Ambulatory Visit: Payer: Self-pay | Admitting: *Deleted

## 2015-05-05 MED ORDER — RABEPRAZOLE SODIUM 20 MG PO TBEC: DELAYED_RELEASE_TABLET | ORAL | Status: DC

## 2015-05-05 MED ORDER — LEVOTHYROXINE SODIUM 112 MCG PO TABS: ORAL_TABLET | ORAL | Status: DC

## 2015-05-05 NOTE — Telephone Encounter (Signed)
Patient requested to be sent to pharmacy.  

## 2015-05-09 ENCOUNTER — Other Ambulatory Visit: Payer: Self-pay | Admitting: Internal Medicine

## 2015-05-16 ENCOUNTER — Other Ambulatory Visit: Payer: Medicare Other

## 2015-05-16 DIAGNOSIS — E039 Hypothyroidism, unspecified: Secondary | ICD-10-CM | POA: Diagnosis not present

## 2015-05-16 DIAGNOSIS — E785 Hyperlipidemia, unspecified: Secondary | ICD-10-CM | POA: Diagnosis not present

## 2015-05-16 DIAGNOSIS — R5383 Other fatigue: Principal | ICD-10-CM

## 2015-05-16 DIAGNOSIS — R5381 Other malaise: Secondary | ICD-10-CM

## 2015-05-17 LAB — COMPREHENSIVE METABOLIC PANEL
ALT: 13 IU/L (ref 0–32)
AST: 21 IU/L (ref 0–40)
Albumin/Globulin Ratio: 2 (ref 1.1–2.5)
Albumin: 4.3 g/dL (ref 3.5–4.7)
Alkaline Phosphatase: 83 IU/L (ref 39–117)
BUN / CREAT RATIO: 19 (ref 11–26)
BUN: 14 mg/dL (ref 8–27)
Bilirubin Total: 0.3 mg/dL (ref 0.0–1.2)
CALCIUM: 9.2 mg/dL (ref 8.7–10.3)
CO2: 23 mmol/L (ref 18–29)
CREATININE: 0.72 mg/dL (ref 0.57–1.00)
Chloride: 99 mmol/L (ref 96–106)
GFR calc Af Amer: 91 mL/min/{1.73_m2} (ref >59)
GFR, EST NON AFRICAN AMERICAN: 79 mL/min/{1.73_m2} (ref >59)
GLUCOSE: 76 mg/dL (ref 65–99)
Globulin, Total: 2.2 g/dL (ref 1.5–4.5)
Potassium: 4.1 mmol/L (ref 3.5–5.2)
Sodium: 137 mmol/L (ref 134–144)
Total Protein: 6.5 g/dL (ref 6.0–8.5)

## 2015-05-17 LAB — LIPID PANEL
CHOL/HDL RATIO: 2.2 ratio (ref 0.0–4.4)
Cholesterol, Total: 155 mg/dL (ref 100–199)
HDL: 70 mg/dL (ref >39)
LDL Calculated: 68 mg/dL (ref 0–99)
TRIGLYCERIDES: 84 mg/dL (ref 0–149)
VLDL CHOLESTEROL CAL: 17 mg/dL (ref 5–40)

## 2015-05-17 LAB — TSH: TSH: 0.99 u[IU]/mL (ref 0.450–4.500)

## 2015-05-19 ENCOUNTER — Other Ambulatory Visit: Payer: Medicare Other

## 2015-05-21 ENCOUNTER — Encounter: Payer: Self-pay | Admitting: Internal Medicine

## 2015-05-21 ENCOUNTER — Ambulatory Visit (INDEPENDENT_AMBULATORY_CARE_PROVIDER_SITE_OTHER): Payer: Medicare Other | Admitting: Internal Medicine

## 2015-05-21 VITALS — BP 140/82 | HR 60 | Temp 97.2°F | Ht 68.0 in | Wt 135.0 lb

## 2015-05-21 DIAGNOSIS — K22719 Barrett's esophagus with dysplasia, unspecified: Secondary | ICD-10-CM | POA: Diagnosis not present

## 2015-05-21 DIAGNOSIS — G2581 Restless legs syndrome: Secondary | ICD-10-CM | POA: Diagnosis not present

## 2015-05-21 DIAGNOSIS — F411 Generalized anxiety disorder: Secondary | ICD-10-CM

## 2015-05-21 DIAGNOSIS — E538 Deficiency of other specified B group vitamins: Secondary | ICD-10-CM | POA: Diagnosis not present

## 2015-05-21 DIAGNOSIS — R5381 Other malaise: Secondary | ICD-10-CM

## 2015-05-21 DIAGNOSIS — R5383 Other fatigue: Secondary | ICD-10-CM

## 2015-05-21 DIAGNOSIS — K2100 Gastro-esophageal reflux disease with esophagitis, without bleeding: Secondary | ICD-10-CM

## 2015-05-21 DIAGNOSIS — J42 Unspecified chronic bronchitis: Secondary | ICD-10-CM | POA: Diagnosis not present

## 2015-05-21 DIAGNOSIS — E785 Hyperlipidemia, unspecified: Secondary | ICD-10-CM | POA: Diagnosis not present

## 2015-05-21 DIAGNOSIS — E039 Hypothyroidism, unspecified: Secondary | ICD-10-CM

## 2015-05-21 NOTE — Progress Notes (Signed)
Patient ID: [name], female   DOB: 04-23-1932, 82 y.o.   MRN: [id number]   Location:  [facility]  Provider: Jeanmarie Hubert, M.D.  Code Status: DO NOT RESUSCITATE Goals of Care:  Advanced Directives 05/21/2015  Does patient have an advance directive? Yes  Type of Paramedic of [place];Living will  Does patient want to make changes to advanced directive? -  Copy of advanced directive(s) in chart? No - copy requested  Would patient like information on creating an advanced directive? -     Chief Complaint  Patient presents with  . Medical Management of Chronic Issues    blood pressure, thyroid, cholesterol, CHF    HPI: Patient is a 82 y.o. female seen today for medical management of chronic diseases.  Patient was last seen 01/22/2015. Problems considered include gastritis, GERD, hypothyroidism, generalized anxiety order, COPD, malaise, dilated cardiomyopathy, hyperlipidemia,, and hypotension.  Had pharyngitis in the 1st week in Jan. Got worse. Was started on Augmentin. May have helped a little. Chest ached.   Got 2 fever blisters during that time.  Saw Dr. Lovena Le in 04/09/15 for pacer check.  Having more nocturnal restless legs. Has used folic acid. It seemed to help. Has a also tried pepper mint leg gel.   Used melatonin 5 mg for insomnia.  Continues to recognize that she is depressed. She has Zoloft on hand.  Expresses concern about superficial varicosities. Has adequate arterial pulsations.  Back and neck are painful. Possibly related to a fall at the grocery store in the past.(Aug 05, 2014).Fell on 10/13/14 in bathtub.  Worries about not having EGD or colonoscopy in a long time. Has seen Dr. Ardis Hughs in the past.    Past Medical History  Diagnosis Date  . Nonischemic cardiomyopathy ([place])     EF initially 20%; Last measurement up to 50% per echo in August of 2012  . CHF (congestive heart failure) ([place])   . LBBB (left bundle branch block)   .  Mitral regurgitation   . Melanoma ([place])     right arm  . Hypothyroidism   . Erosive gastritis 08/2004  . Hyperplastic polyps of stomach 06/2002  . Barrett's esophagus 04/2008  . GERD (gastroesophageal reflux disease)   . Diverticulitis   . Anxiety disorder   . HTN (hypertension)   . ICD (implantable cardiac defibrillator) in place     BiV/ICD; s/p removal of ICD with insertion of BiV PPM 04/26/12  . Hair thinning   . Unspecified sinusitis (chronic)   . Unspecified chronic bronchitis ([place])   . Depressive disorder, not elsewhere classified   . Restless legs syndrome (RLS)   . Chronic airway obstruction, not elsewhere classified   . Benign neoplasm of colon   . Other and unspecified hyperlipidemia   . Unspecified nasal polyp   . Pain in joint, lower leg   . Synovial cyst of popliteal space   . Complication of anesthesia     slow to wake up  . Pacemaker 04/26/2012  . Sleep apnea   . Other and unspecified hyperlipidemia   . Dysthymic disorder   . Palpitations     Past Surgical History  Procedure Laterality Date  . Defibrillator insertion      s/p removal of previously implanted BiV ICD and insertion of a new BiV pacemaker on 04/26/12  . Left arm surgery    . Abdominal hysterectomy  1076    endometriosis  . Right breast lumpectomy  1988    Dr.  Marylene Buerger  . Appendectomy    . Tonsillectomy    . Right knee surgery      arthroscopy: Dr. Shellia Carwin  . Excision of wen  1984    Dr. Parks Ranger  . Excise mole of lip  2001    Dr. Larena Sox  . Inguinal hernia repair Right 09/26/2012    Procedure: RIGHT INGUINAL HERNIA REPAIR WITH MESH;  Surgeon: Odis Hollingshead, MD;  Location: [facility];  Service: General;  Laterality: Right;  . Insertion of mesh Right 09/26/2012    Procedure: INSERTION OF MESH;  Surgeon: Odis Hollingshead, MD;  Location: [facility];  Service: General;  Laterality: Right;  . Inguinal hernia repair Right 12/25/2012    Procedure: explor right groin, small bowel rescection, tissue  repair right femoral hernia;  Surgeon: Adin Hector, MD;  Location[facility];  Service: General;  Laterality: Right;  . Laparoscopy N/A 01/04/2013    Procedure: Diagnostic Laparoscopy, exploratory laparotomy with small bowel resection, closure of right femoral hernia repair;  Surgeon: Madilyn Hook, DO;  Location[facility];  Service: General;  Laterality: N/A;  . Femoral hernia repair  12/25/2012    Dr Dalbert Batman  . Femoral hernia repair  01/04/2013    Recurrent - Dr. Lilyan Punt  . Incisional hernia repair N/A 10/01/2013    Procedure: LAPAROSCOPIC INCISIONAL HERNIA;  Surgeon: Gayland Curry, MD;  [facility];  Service: General;  Laterality: N/A;  . Insertion of mesh N/A 10/01/2013    Procedure: INSERTION OF MESH;  Surgeon: Gayland Curry, MD;  [facility];  Service: General;  Laterality: N/A;  . Bi-ventricular pacemaker insertion N/A 04/26/2012    Procedure: BI-VENTRICULAR PACEMAKER INSERTION (CRT-P);  Surgeon: Evans Lance, MD;  [facility];  Service: Cardiovascular;  Laterality: N/A;    Allergies  Allergen Reactions  . Hydrocodone Itching  . Cephalexin Itching and Swelling  . Doxycycline Other (See Comments)    Per pt: unknown  . Ofloxacin Other (See Comments)    Per pt: unknown  . Tape Other (See Comments)    Burns skin.   . Latex Rash    "If pt. Makes contact with or wearing"  . Neomycin Rash      Medication List       This list is accurate as of: 05/21/15 12:40 PM.  Always use your most recent med list.               acetaminophen 500 MG tablet  Commonly known as:  TYLENOL  Take 500 mg by mouth every 6 (six) hours as needed for pain. For pain     aspirin 81 MG tablet  Take 81 mg by mouth daily.     Biotin 5000 MCG Tabs  Take 1 tablet by mouth daily.     CALCIUM + D PO  Take 2 tablets by mouth daily.     ESTRACE VAGINAL 0.1 MG/GM vaginal cream  Generic drug:  estradiol  Place 1 g vaginally 2 (two) times a week.     furosemide 20 MG tablet  Commonly  known as:  LASIX  Take 20 mg by mouth daily as needed for fluid or edema.     hydroxypropyl methylcellulose / hypromellose 2.5 % ophthalmic solution  Commonly known as:  ISOPTO TEARS / GONIOVISC  Place 1 drop into both eyes 2 (two) times daily as needed for dry eyes.     levothyroxine 112 MCG tablet  Commonly known as:  SYNTHROID, LEVOTHROID  Take one tablet by mouth once daily 30 minutes before breakfast for thyroid.     Magnesium 250 MG Tabs  Take 1 tablet by mouth daily.     multivitamin with minerals Tabs tablet  Take 1 tablet by mouth daily.     potassium chloride SA 20 MEQ tablet  Commonly known as:  K-DUR,KLOR-CON  Take 20 mEq by mouth daily as needed (only with lasix).     RABEprazole 20 MG tablet  Commonly known as:  ACIPHEX  Take one tablet by mouth once daily for stomach     sertraline 25 MG tablet  Commonly known as:  ZOLOFT  One daily to help nerves and depression     trandolapril 2 MG tablet  Commonly known as:  [place]  One daily to strengthen the heart     VITAMIN B 12 PO  Take 1 tablet by mouth daily. Vitamin B12     vitamin C 1000 MG tablet  Take 1,000 mg by mouth daily.        Review of Systems:  Review of Systems  Constitutional: Positive for appetite change, fatigue and unexpected weight change. Negative for fever and chills.  HENT: Positive for congestion, hearing loss, postnasal drip, rhinorrhea and sinus pressure. Negative for ear discharge, ear pain, facial swelling, nosebleeds, sneezing and tinnitus.   Eyes: Negative.   Respiratory: Positive for cough and shortness of breath. Negative for wheezing.   Cardiovascular: Positive for palpitations. Negative for chest pain and leg swelling.  Gastrointestinal: Negative for nausea, abdominal pain, diarrhea, blood in stool and abdominal distention.       Hx erosive gastritis, HH, Barrett's esophagus. Tender RUQ.  Endocrine: Negative.   Genitourinary:       Nocturia x 3. Vaginal discomfort. Uses  hormone cream from GYN, Dr. Nori Riis  Musculoskeletal: Positive for back pain, arthralgias, neck pain and neck stiffness. Negative for myalgias, joint swelling and gait problem.  Skin: Negative for pallor, rash and wound.       S/P right herniorraphy. Right index finger has onycholysis distally and tenderness.  Neurological: Positive for dizziness, numbness and headaches. Negative for tremors, seizures, syncope and speech difficulty.  Hematological: Negative.   Psychiatric/Behavioral: Positive for dysphoric mood. The patient is nervous/anxious.     Health Maintenance  Topic Date Due  . ZOSTAVAX  10/12/1994  . PNA vac Low Risk Adult (2 of 2 - PCV13) 03/13/2009  . MAMMOGRAM  08/22/2015  . INFLUENZA VACCINE  10/07/2015  . TETANUS/TDAP  03/15/2021  . DEXA SCAN  Completed    Physical Exam: Filed Vitals:   05/21/15 1147  BP: 140/82  Pulse: 60  Temp: 97.2 F (36.2 C)  TempSrc: Oral  Height: 5\' 8"  (1.727 m)  Weight: 135 lb (61.236 kg)   Body mass index is 20.53 kg/(m^2). Physical Exam  Constitutional:  Frail, thin body. Losing weight.  HENT:  Right Ear: External ear normal.  Left Ear: External ear normal.  Mouth/Throat: Oropharynx is clear and moist.  Nasal polyp  Eyes:  Corrective lensees  Neck: No JVD present. No tracheal deviation present. No thyromegaly present.  stiff  Cardiovascular: Normal rate and regular rhythm.  Exam reveals no gallop and no friction rub.   No murmur heard. AICD in left upper chest. Raynaud's disease.  Pulmonary/Chest: Effort normal. No respiratory distress. She has no wheezes. She has rales. She exhibits no tenderness.  Abdominal: Soft. Bowel sounds are normal. She exhibits no distension and no mass. Tenderness: RLQ.  Right inguinal area  has scar from surgery.  Midline incisional seroma, previously thought to be a hernia, has resolved.  Musculoskeletal: Normal range of motion. She exhibits no edema.  Mild tenderness in low back . Left ulnar bursa  enlargement has resolved.  Lymphadenopathy:    She has no cervical adenopathy.  Neurological:  Reduced vibratory sensation in both feet.  Skin: No rash noted. No erythema. No pallor.  Onycholysis of right index fingeer. Tender to palpation.  Psychiatric:  Anxious. Depressed affect. Low energy.    Labs reviewed: Basic Metabolic Panel:  Recent Labs  10/18/14 0954 05/16/15 0936  NA 141 137  K 4.3 4.1  CL 101 99  CO2 22 23  GLUCOSE 83 76  BUN 18 14  CREATININE 0.77 0.72  CALCIUM 9.4 9.2  TSH 1.200 0.990   Liver Function Tests:  Recent Labs  10/18/14 0954 05/16/15 0936  AST 20 21  ALT 15 13  ALKPHOS 86 83  BILITOT 0.5 0.3  PROT 6.7 6.5  ALBUMIN 4.2 4.3   No results for input(s): LIPASE, AMYLASE in the last 8760 hours. No results for input(s): AMMONIA in the last 8760 hours. CBC:  Recent Labs  10/18/14 0954  WBC 5.9  NEUTROABS 3.7  HCT 39.7  MCV 87   Lipid Panel:  Recent Labs  10/18/14 0954 05/16/15 0936  CHOL 165 155  HDL 70 70  LDLCALC 76 68  TRIG 93 84  CHOLHDL 2.4 2.2   No results found for: HGBA1C   Assessment/Plan  1. Gastroesophageal reflux disease with esophagitis Asymptomatic Current medication. I recommended that she see Dr. Ardis Hughs again.  2. Hyperlipidemia Controlled - Lipid panel; Future  3. Hypothyroidism, unspecified hypothyroidism type Compensated - TSH; Future  4. Chronic bronchitis, unspecified chronic bronchitis type (HCC) Stable  5. Barrett's esophagus with dysplasia I recommended that she see Dr. Ardis Hughs again.  6. Restless legs -CMP - Magnesium; Future - B12 and Folate Panel; Future  7. Malaise and fatigue -B112 - Comprehensive metabolic panel; Future  8. Generalized anxiety disorder Continue current medications. On an antidepressant. She says she still has Zoloft at home.  9. B12 deficiency - B12 and Folate Panel; Future

## 2015-06-06 DIAGNOSIS — D692 Other nonthrombocytopenic purpura: Secondary | ICD-10-CM | POA: Diagnosis not present

## 2015-06-06 DIAGNOSIS — D225 Melanocytic nevi of trunk: Secondary | ICD-10-CM | POA: Diagnosis not present

## 2015-06-06 DIAGNOSIS — L820 Inflamed seborrheic keratosis: Secondary | ICD-10-CM | POA: Diagnosis not present

## 2015-06-06 DIAGNOSIS — L821 Other seborrheic keratosis: Secondary | ICD-10-CM | POA: Diagnosis not present

## 2015-06-06 DIAGNOSIS — D1801 Hemangioma of skin and subcutaneous tissue: Secondary | ICD-10-CM | POA: Diagnosis not present

## 2015-06-06 DIAGNOSIS — Z85820 Personal history of malignant melanoma of skin: Secondary | ICD-10-CM | POA: Diagnosis not present

## 2015-06-06 DIAGNOSIS — L812 Freckles: Secondary | ICD-10-CM | POA: Diagnosis not present

## 2015-06-06 DIAGNOSIS — L814 Other melanin hyperpigmentation: Secondary | ICD-10-CM | POA: Diagnosis not present

## 2015-06-25 DIAGNOSIS — M25561 Pain in right knee: Secondary | ICD-10-CM | POA: Insufficient documentation

## 2015-06-25 DIAGNOSIS — S8002XA Contusion of left knee, initial encounter: Secondary | ICD-10-CM | POA: Diagnosis not present

## 2015-06-25 DIAGNOSIS — S8001XA Contusion of right knee, initial encounter: Secondary | ICD-10-CM | POA: Diagnosis not present

## 2015-06-25 DIAGNOSIS — M25562 Pain in left knee: Secondary | ICD-10-CM

## 2015-06-25 DIAGNOSIS — M25569 Pain in unspecified knee: Secondary | ICD-10-CM | POA: Insufficient documentation

## 2015-07-02 ENCOUNTER — Encounter: Payer: Self-pay | Admitting: Cardiology

## 2015-07-02 ENCOUNTER — Ambulatory Visit (INDEPENDENT_AMBULATORY_CARE_PROVIDER_SITE_OTHER): Payer: Medicare Other | Admitting: Cardiology

## 2015-07-02 VITALS — BP 119/64 | HR 55 | Ht 68.0 in | Wt 134.2 lb

## 2015-07-02 DIAGNOSIS — E785 Hyperlipidemia, unspecified: Secondary | ICD-10-CM

## 2015-07-02 DIAGNOSIS — I429 Cardiomyopathy, unspecified: Secondary | ICD-10-CM | POA: Diagnosis not present

## 2015-07-02 DIAGNOSIS — I420 Dilated cardiomyopathy: Secondary | ICD-10-CM

## 2015-07-02 DIAGNOSIS — I5022 Chronic systolic (congestive) heart failure: Secondary | ICD-10-CM | POA: Diagnosis not present

## 2015-07-02 MED ORDER — FUROSEMIDE 20 MG PO TABS: 20.0000 mg | ORAL_TABLET | Freq: Every day | ORAL | Status: DC | PRN

## 2015-07-02 NOTE — Progress Notes (Signed)
[name] Date of Birth: Jul 19, 1932 Medical Record [id number]  History of Present Illness: [name] is seen today for follow up. She has multiple medical issues which include a cardiomyopathy, underlying BiV/ICD which resulted in improvement of her EF, LBBB, HTN, thyroid disease, melanoma, anxiety/depression and chronic systolic HF. EF was 55% by last Echo in 2/16 .   She had a difficult time with hernia surgery in 2014. She had a fourth surgery in July 2015 for correction of a ventral hernia. Following one of the surgeries she had a wide complex arrhythmia that was felt by Dr. Rayann Heman to be her intrinsic rhythm (over her Biv paced rhythm). This was felt to be related to increased adrenergic tone. She was managed with beta blocker and digoxin. Digoxin was later discontinued.   On follow up today she is doing well from a cardiac standpoint. No chest pain or SOB. No palpitations. No edema. Still very anxious and admits to increased depression. She was started on Zoloft earlier this month. She has had a couple of accidental falls and still has some back pain related to this.     Current Outpatient Prescriptions  Medication Sig Dispense Refill  . acetaminophen (TYLENOL) 500 MG tablet Take 500 mg by mouth every 6 (six) hours as needed for pain. For pain    . Ascorbic Acid (VITAMIN C) 1000 MG tablet Take 1,000 mg by mouth daily.    Marland Kitchen aspirin 81 MG tablet Take 81 mg by mouth daily.     . Biotin 5000 MCG TABS Take 1 tablet by mouth daily.    . Calcium Carbonate-Vitamin D (CALCIUM + D PO) Take 2 tablets by mouth daily.     . Cyanocobalamin (VITAMIN B 12 PO) Take 1 tablet by mouth daily. Vitamin B12    . ESTRACE VAGINAL 0.1 MG/GM vaginal cream Place 1 g vaginally 2 (two) times a week.     . furosemide (LASIX) 20 MG tablet Take 1 tablet (20 mg total) by mouth daily as needed for fluid or edema. 30 tablet 6  . hydroxypropyl methylcellulose (ISOPTO TEARS) 2.5 % ophthalmic solution Place 1 drop into  both eyes 2 (two) times daily as needed for dry eyes.    Marland Kitchen levothyroxine (SYNTHROID, LEVOTHROID) 112 MCG tablet Take one tablet by mouth once daily 30 minutes before breakfast for thyroid. 90 tablet 1  . Magnesium 250 MG TABS Take 1 tablet by mouth daily.     . Multiple Vitamin (MULTIVITAMIN WITH MINERALS) TABS Take 1 tablet by mouth daily.    . potassium chloride SA (K-DUR,KLOR-CON) 20 MEQ tablet Take 20 mEq by mouth daily as needed (only with lasix).    . RABEprazole (ACIPHEX) 20 MG tablet Take one tablet by mouth once daily for stomach 90 tablet 3  . sertraline (ZOLOFT) 25 MG tablet One daily to help nerves and depression 30 tablet 5  . trandolapril (MAVIK) 2 MG tablet One daily to strengthen the heart 90 tablet 3  . [DISCONTINUED] omeprazole (PRILOSEC) 20 MG capsule Take 1 capsule (20 mg total) by mouth daily. 30 capsule 11   No current facility-administered medications for this visit.    Allergies  Allergen Reactions  . Hydrocodone Itching  . Cephalexin Itching and Swelling  . Doxycycline Other (See Comments)    Per pt: unknown  . Ofloxacin Other (See Comments)    Per pt: unknown  . Tape Other (See Comments)    Burns skin.   . Latex Rash    "  If pt. Makes contact with or wearing"  . Neomycin Rash    Past Medical History  Diagnosis Date  . Nonischemic cardiomyopathy ([place])     EF initially 20%; Last measurement up to 50% per echo in August of 2012  . CHF (congestive heart failure) ([place])   . LBBB (left bundle branch block)   . Mitral regurgitation   . Melanoma ([place])     right arm  . Hypothyroidism   . Erosive gastritis 08/2004  . Hyperplastic polyps of stomach 06/2002  . Barrett's esophagus 04/2008  . GERD (gastroesophageal reflux disease)   . Diverticulitis   . Anxiety disorder   . HTN (hypertension)   . ICD (implantable cardiac defibrillator) in place     BiV/ICD; s/p removal of ICD with insertion of BiV PPM 04/26/12  . Hair thinning   . Unspecified sinusitis (chronic)    . Unspecified chronic bronchitis ([place])   . Depressive disorder, not elsewhere classified   . Restless legs syndrome (RLS)   . Chronic airway obstruction, not elsewhere classified   . Benign neoplasm of colon   . Other and unspecified hyperlipidemia   . Unspecified nasal polyp   . Pain in joint, lower leg   . Synovial cyst of popliteal space   . Complication of anesthesia     slow to wake up  . Pacemaker 04/26/2012  . Sleep apnea   . Other and unspecified hyperlipidemia   . Dysthymic disorder   . Palpitations     Past Surgical History  Procedure Laterality Date  . Defibrillator insertion      s/p removal of previously implanted BiV ICD and insertion of a new BiV pacemaker on 04/26/12  . Left arm surgery    . Abdominal hysterectomy  1076    endometriosis  . Right breast lumpectomy  1988    Dr. Marylene Buerger  . Appendectomy    . Tonsillectomy    . Right knee surgery      arthroscopy: Dr. Shellia Carwin  . Excision of wen  1984    Dr. Parks Ranger  . Excise mole of lip  2001    Dr. Larena Sox  . Inguinal hernia repair Right 09/26/2012    Procedure: RIGHT INGUINAL HERNIA REPAIR WITH MESH;  Surgeon: Odis Hollingshead, MD;  Location: [facility];  Service: General;  Laterality: Right;  . Insertion of mesh Right 09/26/2012    Procedure: INSERTION OF MESH;  Surgeon: Odis Hollingshead, MD;  Location: [facility];  Service: General;  Laterality: Right;  . Inguinal hernia repair Right 12/25/2012    Procedure: explor right groin, small bowel rescection, tissue repair right femoral hernia;  Surgeon: Adin Hector, MD;  [facility];  Service: General;  Laterality: Right;  . Laparoscopy N/A 01/04/2013    Procedure: Diagnostic Laparoscopy, exploratory laparotomy with small bowel resection, closure of right femoral hernia repair;  Surgeon: Madilyn Hook, DO;  [facility];  Service: General;  Laterality: N/A;  . Femoral hernia repair  12/25/2012    Dr Dalbert Batman  . Femoral hernia repair  01/04/2013     Recurrent - Dr. Lilyan Punt  . Incisional hernia repair N/A 10/01/2013    Procedure: LAPAROSCOPIC INCISIONAL HERNIA;  Surgeon: Gayland Curry, MD;  [facility];  Service: General;  Laterality: N/A;  . Insertion of mesh N/A 10/01/2013    Procedure: INSERTION OF MESH;  Surgeon: Gayland Curry, MD;  [facility];  Service: General;  Laterality: N/A;  . Bi-ventricular pacemaker insertion  N/A 04/26/2012    Procedure: BI-VENTRICULAR PACEMAKER INSERTION (CRT-P);  Surgeon: Evans Lance, MD;  Location: [facility];  Service: Cardiovascular;  Laterality: N/A;    History  Smoking status  . Former Smoker  . Types: Cigarettes  . Quit date: 12/22/1993  Smokeless tobacco  . Never Used    Comment: Does'nt reall year quit     History  Alcohol Use No    Family History  Problem Relation Age of Onset  . Heart disease Other     maternal side  . Colon cancer Paternal Uncle   . Breast cancer Maternal Aunt   . Diabetes Neg Hx   . Stroke Father     Review of Systems: The review of systems is per the HPI.   All other systems were reviewed and are negative.  Physical Exam: BP 119/64 mmHg  Pulse 55  Ht 5\' 8"  (1.727 m)  Wt 60.873 kg (134 lb 3.2 oz)  BMI 20.41 kg/m2 Patient is very pleasant and in no acute distress.Skin is warm and dry. Color is normal.  HEENT is unremarkable. Normocephalic/atraumatic. PERRL. Sclera are nonicteric. Neck is supple. No masses. No JVD. Lungs are clear. Cardiac exam shows a regular rate and rhythm. Normal S1-2. No gallop or murmur. Abdomen is soft. Extremities are without edema. Gait and ROM are intact. No gross neurologic deficits noted.  Wt Readings from Last 3 Encounters:  07/02/15 60.873 kg (134 lb 3.2 oz)  05/21/15 61.236 kg (135 lb)  04/09/15 61.236 kg (135 lb)     LABORATORY DATA: Lab Results  Component Value Date   WBC 5.9 10/18/2014   HGB 14.2 04/15/2014   HCT 39.7 10/18/2014   PLT 202 10/02/2013   GLUCOSE 76 05/16/2015   CHOL 155 05/16/2015    TRIG 84 05/16/2015   HDL 70 05/16/2015   LDLCALC 68 05/16/2015   ALT 13 05/16/2015   AST 21 05/16/2015   NA 137 05/16/2015   K 4.1 05/16/2015   CL 99 05/16/2015   CREATININE 0.72 05/16/2015   BUN 14 05/16/2015   CO2 23 05/16/2015   TSH 0.990 05/16/2015   INR 1.08 09/19/2012    Recent ICD check in Feb showed biv pacing >99% of the time. short run of NSVT   Echo: 07/11/14:  Study Conclusions  - Left ventricle: The cavity size was normal. Wall thickness was normal. Systolic function was normal. The estimated ejection fraction was in the range of 50% to 55%. - Mitral valve: There was mild regurgitation. - Pericardium, extracardiac: A trivial pericardial effusion was identified.   Assessment / Plan: 1.  History of dialted Cardiomyopathy with chronic systolic CHF.  EF now low normal - has BiV/ICD in place -  I think overall she is doing great.  Continue lower dose ACEi. We stopped metoprolol due to Raynauds symptoms. Will update Echo next month.  2. NSVT. Follow up ICD/biV in device clinic.

## 2015-07-02 NOTE — Patient Instructions (Signed)
We will schedule you for an Echocardiogram in May  Continue your current therapy  I will see you in 6 months.

## 2015-07-04 ENCOUNTER — Telehealth: Payer: Self-pay | Admitting: Cardiology

## 2015-07-04 MED ORDER — POTASSIUM CHLORIDE CRYS ER 20 MEQ PO TBCR: 20.0000 meq | EXTENDED_RELEASE_TABLET | Freq: Every day | ORAL | Status: DC | PRN

## 2015-07-04 NOTE — Telephone Encounter (Signed)
Mrs. Gallon is wanting you to give a her a call she has a question( Did not Disclose the question) . Please call   Thanks

## 2015-07-04 NOTE — Telephone Encounter (Signed)
Returned call to patient she requested kdur refill.Refill sent to pharmacy.

## 2015-07-07 DIAGNOSIS — N390 Urinary tract infection, site not specified: Secondary | ICD-10-CM | POA: Diagnosis not present

## 2015-07-07 DIAGNOSIS — Z6820 Body mass index (BMI) 20.0-20.9, adult: Secondary | ICD-10-CM | POA: Diagnosis not present

## 2015-07-07 DIAGNOSIS — N760 Acute vaginitis: Secondary | ICD-10-CM | POA: Diagnosis not present

## 2015-07-07 DIAGNOSIS — R350 Frequency of micturition: Secondary | ICD-10-CM | POA: Diagnosis not present

## 2015-07-07 DIAGNOSIS — Z01419 Encounter for gynecological examination (general) (routine) without abnormal findings: Secondary | ICD-10-CM | POA: Diagnosis not present

## 2015-07-07 DIAGNOSIS — R3915 Urgency of urination: Secondary | ICD-10-CM | POA: Diagnosis not present

## 2015-07-09 ENCOUNTER — Encounter: Payer: Self-pay | Admitting: Internal Medicine

## 2015-07-17 ENCOUNTER — Ambulatory Visit (HOSPITAL_COMMUNITY): Payer: Medicare Other | Attending: Internal Medicine

## 2015-07-17 DIAGNOSIS — I5022 Chronic systolic (congestive) heart failure: Secondary | ICD-10-CM | POA: Insufficient documentation

## 2015-07-17 DIAGNOSIS — I429 Cardiomyopathy, unspecified: Secondary | ICD-10-CM | POA: Insufficient documentation

## 2015-07-17 DIAGNOSIS — I351 Nonrheumatic aortic (valve) insufficiency: Secondary | ICD-10-CM | POA: Insufficient documentation

## 2015-07-17 DIAGNOSIS — I110 Hypertensive heart disease with heart failure: Secondary | ICD-10-CM | POA: Insufficient documentation

## 2015-07-17 DIAGNOSIS — I340 Nonrheumatic mitral (valve) insufficiency: Secondary | ICD-10-CM | POA: Diagnosis not present

## 2015-07-17 DIAGNOSIS — I420 Dilated cardiomyopathy: Secondary | ICD-10-CM

## 2015-07-17 DIAGNOSIS — E785 Hyperlipidemia, unspecified: Secondary | ICD-10-CM

## 2015-08-18 ENCOUNTER — Other Ambulatory Visit: Payer: Medicare Other

## 2015-08-18 DIAGNOSIS — R5381 Other malaise: Secondary | ICD-10-CM

## 2015-08-18 DIAGNOSIS — E538 Deficiency of other specified B group vitamins: Secondary | ICD-10-CM | POA: Diagnosis not present

## 2015-08-18 DIAGNOSIS — E039 Hypothyroidism, unspecified: Secondary | ICD-10-CM

## 2015-08-18 DIAGNOSIS — R5383 Other fatigue: Secondary | ICD-10-CM

## 2015-08-18 DIAGNOSIS — G2581 Restless legs syndrome: Secondary | ICD-10-CM | POA: Diagnosis not present

## 2015-08-18 DIAGNOSIS — E785 Hyperlipidemia, unspecified: Secondary | ICD-10-CM | POA: Diagnosis not present

## 2015-08-19 LAB — COMPREHENSIVE METABOLIC PANEL
ALT: 22 IU/L (ref 0–32)
AST: 30 IU/L (ref 0–40)
Albumin/Globulin Ratio: 1.8 (ref 1.2–2.2)
Albumin: 4.3 g/dL (ref 3.5–4.7)
Alkaline Phosphatase: 82 IU/L (ref 39–117)
BILIRUBIN TOTAL: 0.4 mg/dL (ref 0.0–1.2)
BUN/Creatinine Ratio: 24 (ref 12–28)
BUN: 18 mg/dL (ref 8–27)
CHLORIDE: 98 mmol/L (ref 96–106)
CO2: 23 mmol/L (ref 18–29)
CREATININE: 0.76 mg/dL (ref 0.57–1.00)
Calcium: 9.3 mg/dL (ref 8.7–10.3)
GFR calc non Af Amer: 74 mL/min/{1.73_m2} (ref >59)
GFR, EST AFRICAN AMERICAN: 86 mL/min/{1.73_m2} (ref >59)
GLUCOSE: 81 mg/dL (ref 65–99)
Globulin, Total: 2.4 g/dL (ref 1.5–4.5)
Potassium: 4.4 mmol/L (ref 3.5–5.2)
Sodium: 137 mmol/L (ref 134–144)
TOTAL PROTEIN: 6.7 g/dL (ref 6.0–8.5)

## 2015-08-19 LAB — MAGNESIUM: MAGNESIUM: 1.9 mg/dL (ref 1.6–2.3)

## 2015-08-19 LAB — B12 AND FOLATE PANEL: Vitamin B-12: 854 pg/mL (ref 211–946)

## 2015-08-19 LAB — LIPID PANEL
CHOLESTEROL TOTAL: 168 mg/dL (ref 100–199)
Chol/HDL Ratio: 2.4 ratio units (ref 0.0–4.4)
HDL: 71 mg/dL (ref >39)
LDL CALC: 79 mg/dL (ref 0–99)
Triglycerides: 92 mg/dL (ref 0–149)
VLDL CHOLESTEROL CAL: 18 mg/dL (ref 5–40)

## 2015-08-19 LAB — TSH: TSH: 0.931 u[IU]/mL (ref 0.450–4.500)

## 2015-08-20 ENCOUNTER — Encounter: Payer: Self-pay | Admitting: Internal Medicine

## 2015-08-20 ENCOUNTER — Ambulatory Visit (INDEPENDENT_AMBULATORY_CARE_PROVIDER_SITE_OTHER): Payer: Medicare Other | Admitting: Internal Medicine

## 2015-08-20 VITALS — BP 122/70 | HR 56 | Temp 97.4°F | Ht 68.0 in | Wt 131.0 lb

## 2015-08-20 DIAGNOSIS — M40209 Unspecified kyphosis, site unspecified: Secondary | ICD-10-CM | POA: Insufficient documentation

## 2015-08-20 DIAGNOSIS — M545 Low back pain, unspecified: Secondary | ICD-10-CM | POA: Insufficient documentation

## 2015-08-20 DIAGNOSIS — K2100 Gastro-esophageal reflux disease with esophagitis, without bleeding: Secondary | ICD-10-CM

## 2015-08-20 DIAGNOSIS — E039 Hypothyroidism, unspecified: Secondary | ICD-10-CM

## 2015-08-20 DIAGNOSIS — F329 Major depressive disorder, single episode, unspecified: Secondary | ICD-10-CM

## 2015-08-20 DIAGNOSIS — M4003 Postural kyphosis, cervicothoracic region: Secondary | ICD-10-CM

## 2015-08-20 DIAGNOSIS — I700 Atherosclerosis of aorta: Secondary | ICD-10-CM

## 2015-08-20 DIAGNOSIS — G4700 Insomnia, unspecified: Secondary | ICD-10-CM

## 2015-08-20 DIAGNOSIS — R0989 Other specified symptoms and signs involving the circulatory and respiratory systems: Secondary | ICD-10-CM

## 2015-08-20 DIAGNOSIS — I714 Abdominal aortic aneurysm, without rupture, unspecified: Secondary | ICD-10-CM

## 2015-08-20 DIAGNOSIS — I77819 Aortic ectasia, unspecified site: Secondary | ICD-10-CM

## 2015-08-20 DIAGNOSIS — E785 Hyperlipidemia, unspecified: Secondary | ICD-10-CM | POA: Diagnosis not present

## 2015-08-20 DIAGNOSIS — F32A Depression, unspecified: Secondary | ICD-10-CM

## 2015-08-20 DIAGNOSIS — G8929 Other chronic pain: Secondary | ICD-10-CM

## 2015-08-20 DIAGNOSIS — I959 Hypotension, unspecified: Secondary | ICD-10-CM

## 2015-08-20 HISTORY — DX: Insomnia, unspecified: G47.00

## 2015-08-20 HISTORY — DX: Unspecified kyphosis, site unspecified: M40.209

## 2015-08-20 MED ORDER — ALPRAZOLAM 0.25 MG PO TABS: ORAL_TABLET | ORAL | Status: DC

## 2015-08-20 MED ORDER — DOXEPIN HCL 25 MG PO CAPS: ORAL_CAPSULE | ORAL | Status: DC

## 2015-08-20 NOTE — Progress Notes (Signed)
Patient ID: [name], female   DOB: [birth year], 82 y.o.   MRN: [id number]    Facility  [facility]    Place of Service:   OFFICE    Allergies  Allergen Reactions  . Hydrocodone Itching  . Cephalexin Itching and Swelling  . Ciprofloxacin     Not indicated due to aneurysm in aorta    . Doxycycline Other (See Comments)    Per pt: unknown  . Levofloxacin     Not indicated due to aneurysm in aorta   . Moxifloxacin     Not indicated due to aneurysm in aorta   . Ofloxacin Other (See Comments)    Per pt: unknown  . Tape Other (See Comments)    Burns skin.   . Latex Rash    "If pt. Makes contact with or wearing"  . Neomycin Rash    Chief Complaint  Patient presents with  . Medical Management of Chronic Issues    3 month follow-up on B/P, thyroid, cholesterol, CHF, and GERD. Discuss labs (copy printed)   . Medication Management    Discuss Zoloft, patient discontinued on her own     HPI:   Continues to lose weight. Now down to 131#.  Hypotension has improved.  Depression: Took Zoloft for 2 months, then stopped it due to insomnia. Did not tolerate Lexapro in the past due to dry mouth. Wants to use Xanax.  Abdominal discomfort at the hernia area that was repaired with mesh on the right side. This terrifies her. She thinks she has a hernia on the left side.  2 D Echo 07/17/15 had LVEF 54%. (stable).  Hypothyroidism, unspecified hypothyroidism type -= compensated  Hyperlipidemia - controlled  Gastroesophageal reflux disease with esophagitis - improved  Back pains- fell last fall. Chronic pain. Wants something done.   Kyphosis. Has been more prominent according to patient.      Medications: Patient's Medications  New Prescriptions   No medications on file  Previous Medications   ACETAMINOPHEN (TYLENOL) 500 MG TABLET    Take 500 mg by mouth every 6 (six) hours as needed for pain. For pain   ASCORBIC ACID (VITAMIN C) 1000 MG TABLET    Take 1,000 mg by mouth daily.   ASPIRIN 81 MG TABLET    Take 81 mg by mouth daily.    BIOTIN 5000 MCG TABS    Take 1 tablet by mouth daily.   CALCIUM CARBONATE-VITAMIN D (CALCIUM + D PO)    Take 2 tablets by mouth daily.    CYANOCOBALAMIN (VITAMIN B 12 PO)    Take 1 tablet by mouth daily. Vitamin B12   ESTRACE VAGINAL 0.1 MG/GM VAGINAL CREAM    Place 1 g vaginally 2 (two) times a week.    FUROSEMIDE (LASIX) 20 MG TABLET    Take 1 tablet (20 mg total) by mouth daily as needed for fluid or edema.   HYDROXYPROPYL METHYLCELLULOSE (ISOPTO TEARS) 2.5 % OPHTHALMIC SOLUTION    Place 1 drop into both eyes 2 (two) times daily as needed for dry eyes.   LEVOTHYROXINE (SYNTHROID, LEVOTHROID) 112 MCG TABLET    Take one tablet by mouth once daily 30 minutes before breakfast for thyroid.   MAGNESIUM 250 MG TABS    Take 1 tablet by mouth daily.    MULTIPLE VITAMIN (MULTIVITAMIN WITH MINERALS) TABS    Take 1 tablet by mouth daily.   POTASSIUM CHLORIDE SA (K-DUR,KLOR-CON) 20 MEQ TABLET    Take 1 tablet (20 mEq  total) by mouth daily as needed (only with lasix).   RABEPRAZOLE (ACIPHEX) 20 MG TABLET    Take one tablet by mouth once daily for stomach   SERTRALINE (ZOLOFT) 25 MG TABLET    One daily to help nerves and depression   TRANDOLAPRIL (MAVIK) 2 MG TABLET    One daily to strengthen the heart  Modified Medications   No medications on file  Discontinued Medications   No medications on file    Review of Systems  Constitutional: Positive for appetite change, fatigue and unexpected weight change. Negative for fever and chills.  HENT: Positive for congestion, hearing loss, postnasal drip, rhinorrhea and sinus pressure. Negative for ear discharge, ear pain, facial swelling, nosebleeds, sneezing and tinnitus.   Eyes: Negative.   Respiratory: Positive for cough and shortness of breath. Negative for wheezing.   Cardiovascular: Positive for palpitations. Negative for chest pain and leg swelling.  Gastrointestinal: Negative for nausea, abdominal  pain, diarrhea, blood in stool and abdominal distention.       Hx erosive gastritis, HH, Barrett's esophagus. Tender RUQ and RLQ.  Endocrine: Negative.   Genitourinary:       Nocturia x 3. Hx of vaginal discomfort. Uses hormone cream from GYN, Dr. Jennette Kettle  Musculoskeletal: Positive for back pain, arthralgias, neck pain and neck stiffness. Negative for myalgias, joint swelling and gait problem.  Skin: Negative for pallor, rash and wound.       S/P right herniorraphy. Right index finger has onycholysis distally and tenderness.  Neurological: Positive for dizziness, numbness and headaches. Negative for tremors, seizures, syncope and speech difficulty.  Hematological: Negative.   Psychiatric/Behavioral: Positive for dysphoric mood. The patient is nervous/anxious.     Filed Vitals:   08/20/15 1335  BP: 122/70  Pulse: 56  Temp: 97.4 F (36.3 C)  TempSrc: Oral  Height: 5\' 8"  (1.727 m)  Weight: 131 lb (59.421 kg)  SpO2: 98%   Body mass index is 19.92 kg/(m^2). Filed Weights   08/20/15 1335  Weight: 131 lb (59.421 kg)     Physical Exam  Constitutional:  Frail, thin body. Losing weight.  HENT:  Right Ear: External ear normal.  Left Ear: External ear normal.  Mouth/Throat: Oropharynx is clear and moist.  Nasal polyp  Eyes:  Corrective lensees  Neck: No JVD present. No tracheal deviation present. No thyromegaly present.  stiff  Cardiovascular: Normal rate and regular rhythm.  Exam reveals no gallop and no friction rub.   No murmur heard. AICD in left upper chest. Raynaud's disease.  Pulmonary/Chest: Effort normal. No respiratory distress. She has no wheezes. She has rales. She exhibits no tenderness.  Abdominal: Soft. Bowel sounds are normal. She exhibits no distension and no mass. Tenderness: RLQ.  Right inguinal area has scar from surgery.   Musculoskeletal: Normal range of motion. She exhibits no edema.  Mild tenderness in low back . Left ulnar bursa enlargement has  resolved.  Lymphadenopathy:    She has no cervical adenopathy.  Neurological:  Reduced vibratory sensation in both feet.  Skin: No rash noted. No erythema. No pallor.  Onycholysis of right index fingeer. Tender to palpation.  Psychiatric:  Anxious. Depressed affect. Low energy.    Labs reviewed: Lab Summary Latest Ref Rng 08/18/2015 05/16/2015 10/18/2014  Hemoglobin 11.1 - 15.9 g/dL (None) (None) 12/18/2014  Hematocrit 34.0 - 46.6 % (None) (None) 39.7  White count 3.4 - 10.8 x10E3/uL (None) (None) 5.9  Platelet count - (None) (None) (None)  Sodium 134 - 144 mmol/L 137  137 141  Potassium 3.5 - 5.2 mmol/L 4.4 4.1 4.3  Calcium 8.7 - 10.3 mg/dL 9.3 9.2 9.4  Phosphorus - (None) (None) (None)  Creatinine 0.57 - 1.00 mg/dL 0.76 0.72 0.77  AST 0 - 40 IU/L '30 21 20  '$ Alk Phos 39 - 117 IU/L 82 83 86  Bilirubin 0.0 - 1.2 mg/dL 0.4 0.3 0.5  Glucose 65 - 99 mg/dL 81 76 83  Cholesterol - (None) (None) (None)  HDL cholesterol >39 mg/dL 71 70 70  Triglycerides 0 - 149 mg/dL 92 84 93  LDL Direct - (None) (None) (None)  LDL Calc 0 - 99 mg/dL 79 68 76  Total protein - (None) (None) (None)  Albumin 3.5 - 4.7 g/dL 4.3 4.3 4.2   Lab Results  Component Value Date   TSH 0.931 08/18/2015   TSH 0.990 05/16/2015   TSH 1.200 10/18/2014   Lab Results  Component Value Date   BUN 18 08/18/2015   BUN 14 05/16/2015   BUN 18 10/18/2014   No results found for: HGBA1C  Assessment/Plan  1. Hypotension, unspecified hypotension type improved  2. Hypothyroidism, unspecified hypothyroidism type Normal TSH  3. Hyperlipidemia controlled  4. Gastroesophageal reflux disease with esophagitis Asymptomatic on AcipHex  5. Depression - doxepin (SINEQUAN) 25 MG capsule; One nightly to help nerves and sleep.  Dispense: 60 capsule; Refill: 5  6. Chronic midline low back pain without sciatica - Ambulatory referral to Orthopedic Surgery  7. Postural kyphosis of cervicothoracic region - Ambulatory referral to  Orthopedic Surgery  8. Insomnia - doxepin (SINEQUAN) 25 MG capsule; One nightly to help nerves and sleep.  Dispense: 60 capsule; Refill: 5 - ALPRAZolam (XANAX) 0.25 MG tablet; One up to twice daily if needed for anxiety or sleep  Dispense: 60 tablet; Refill: 0

## 2015-08-25 ENCOUNTER — Other Ambulatory Visit: Payer: Medicare Other

## 2015-08-25 DIAGNOSIS — Z803 Family history of malignant neoplasm of breast: Secondary | ICD-10-CM | POA: Diagnosis not present

## 2015-08-25 DIAGNOSIS — Z1231 Encounter for screening mammogram for malignant neoplasm of breast: Secondary | ICD-10-CM | POA: Diagnosis not present

## 2015-08-25 LAB — HM MAMMOGRAPHY

## 2015-08-27 ENCOUNTER — Encounter: Payer: Self-pay | Admitting: *Deleted

## 2015-08-27 ENCOUNTER — Ambulatory Visit: Payer: Medicare Other | Admitting: Internal Medicine

## 2015-08-29 ENCOUNTER — Ambulatory Visit
Admission: RE | Admit: 2015-08-29 | Discharge: 2015-08-29 | Disposition: A | Discharge status: Home / Self Care | Payer: Medicare Other | Source: Ambulatory Visit | Attending: Internal Medicine | Admitting: Internal Medicine

## 2015-08-29 DIAGNOSIS — I714 Abdominal aortic aneurysm, without rupture, unspecified: Secondary | ICD-10-CM

## 2015-08-29 DIAGNOSIS — Z136 Encounter for screening for cardiovascular disorders: Secondary | ICD-10-CM | POA: Diagnosis not present

## 2015-09-01 ENCOUNTER — Other Ambulatory Visit: Payer: Self-pay

## 2015-09-01 DIAGNOSIS — I723 Aneurysm of iliac artery: Secondary | ICD-10-CM

## 2015-09-11 ENCOUNTER — Encounter: Payer: Self-pay | Admitting: Vascular Surgery

## 2015-09-12 ENCOUNTER — Ambulatory Visit (INDEPENDENT_AMBULATORY_CARE_PROVIDER_SITE_OTHER): Payer: Medicare Other | Admitting: Vascular Surgery

## 2015-09-12 ENCOUNTER — Encounter: Payer: Self-pay | Admitting: Vascular Surgery

## 2015-09-12 VITALS — BP 136/74 | HR 55 | Ht 68.0 in | Wt 132.4 lb

## 2015-09-12 DIAGNOSIS — I714 Abdominal aortic aneurysm, without rupture, unspecified: Secondary | ICD-10-CM

## 2015-09-12 NOTE — Progress Notes (Signed)
[facility]  Patient name: [name] MRN: [id number] DOB: January 14, 1933 Sex: female  REASON FOR CONSULT: Abdominal aortic aneurysm. Referred by Dr. Nyoka Cowden.  HPI: [name] is a 82 y.o. female, who is referred for evaluation of a small aneurysm. She had a CT scan a year ago which showed that she had a small 2.7 cm aneurysm of the infrarenal aorta. She also had some ectasia of the iliac arteries. She had a one year follow up ultrasound which shows that these have not changed significantly in size. She sent for vascular consultation.  As no family history of aneurysmal disease. She's had no significant sudden back pain or belly pain. She does have occasional abdominal pain. She has had multiple previous abdominal operations and also has mesh from hernia.  Have reviewed the records that were sent from Dr. Vonna Kotyk office. The patient does have a history of hypothyroidism, hyperlipidemia, depression, gastroesophageal reflux disease, and low back pain.  Past Medical History  Diagnosis Date  . Nonischemic cardiomyopathy ([place])     EF initially 20%; Last measurement up to 50% per echo in August of 2012  . CHF (congestive heart failure) ([place])   . LBBB (left bundle branch block)   . Mitral regurgitation   . Melanoma ([place])     right arm  . Hypothyroidism   . Erosive gastritis 08/2004  . Hyperplastic polyps of stomach 06/2002  . Barrett's esophagus 04/2008  . GERD (gastroesophageal reflux disease)   . Diverticulitis   . Anxiety disorder   . HTN (hypertension)   . ICD (implantable cardiac defibrillator) in place     BiV/ICD; s/p removal of ICD with insertion of BiV PPM 04/26/12  . Hair thinning   . Unspecified sinusitis (chronic)   . Unspecified chronic bronchitis ([place])   . Depressive disorder, not elsewhere classified   . Restless legs syndrome (RLS)   . Chronic airway obstruction, not elsewhere classified   . Benign neoplasm of colon   . Other and  unspecified hyperlipidemia   . Unspecified nasal polyp   . Pain in joint, lower leg   . Synovial cyst of popliteal space   . Complication of anesthesia     slow to wake up  . Pacemaker 04/26/2012  . Sleep apnea   . Other and unspecified hyperlipidemia   . Dysthymic disorder   . Palpitations   . Kyphosis 08/20/2015  . Insomnia 08/20/2015    Family History  Problem Relation Age of Onset  . Heart disease Other     maternal side  . Colon cancer Paternal Uncle   . Breast cancer Maternal Aunt   . Diabetes Neg Hx   . Stroke Father     SOCIAL HISTORY: Social History   Social History  . Marital Status: Single    Spouse Name: N/A  . Number of Children: 0  . Years of Education: N/A   Occupational History  . Retired   .     Social History Main Topics  . Smoking status: Former Smoker    Types: Cigarettes    Quit date: 12/22/1993  . Smokeless tobacco: Never Used     Comment: Does'nt reall year quit   . Alcohol Use: No  . Drug Use: No  . Sexual Activity: No   Other Topics Concern  . Not on file   Social History Narrative    Allergies  Allergen Reactions  . Hydrocodone Itching  . Cephalexin Itching and Swelling  .  Ciprofloxacin     Not indicated due to aneurysm in aorta    . Doxycycline Other (See Comments)    Per pt: unknown  . Escitalopram     Dry mouth  . Levofloxacin     Not indicated due to aneurysm in aorta   . Moxifloxacin     Not indicated due to aneurysm in aorta   . Ofloxacin Other (See Comments)    Per pt: unknown  . Sertraline     insomnia  . Tape Other (See Comments)    Burns skin.   . Latex Rash    "If pt. Makes contact with or wearing"  . Neomycin Rash    Current Outpatient Prescriptions  Medication Sig Dispense Refill  . acetaminophen (TYLENOL) 500 MG tablet Take 500 mg by mouth every 6 (six) hours as needed for pain. For pain    . ALPRAZolam (XANAX) 0.25 MG tablet One up to twice daily if needed for anxiety or sleep 60 tablet 0  .  Ascorbic Acid (VITAMIN C) 1000 MG tablet Take 1,000 mg by mouth daily.    Marland Kitchen aspirin 81 MG tablet Take 81 mg by mouth daily.     . Biotin 5000 MCG TABS Take 1 tablet by mouth daily.    . Calcium Carbonate-Vitamin D (CALCIUM + D PO) Take 2 tablets by mouth daily.     . Cyanocobalamin (VITAMIN B 12 PO) Take 1 tablet by mouth daily. Vitamin B12    . doxepin (SINEQUAN) 25 MG capsule One nightly to help nerves and sleep. 60 capsule 5  . ESTRACE VAGINAL 0.1 MG/GM vaginal cream Place 1 g vaginally 2 (two) times a week.     . furosemide (LASIX) 20 MG tablet Take 1 tablet (20 mg total) by mouth daily as needed for fluid or edema. 30 tablet 6  . hydroxypropyl methylcellulose (ISOPTO TEARS) 2.5 % ophthalmic solution Place 1 drop into both eyes 2 (two) times daily as needed for dry eyes.    Marland Kitchen levothyroxine (SYNTHROID, LEVOTHROID) 112 MCG tablet Take one tablet by mouth once daily 30 minutes before breakfast for thyroid. 90 tablet 1  . Magnesium 250 MG TABS Take 1 tablet by mouth daily.     . Multiple Vitamin (MULTIVITAMIN WITH MINERALS) TABS Take 1 tablet by mouth daily.    . potassium chloride SA (K-DUR,KLOR-CON) 20 MEQ tablet Take 1 tablet (20 mEq total) by mouth daily as needed (only with lasix). 30 tablet 6  . RABEprazole (ACIPHEX) 20 MG tablet Take one tablet by mouth once daily for stomach 90 tablet 3  . trandolapril (MAVIK) 2 MG tablet One daily to strengthen the heart 90 tablet 3  . [DISCONTINUED] omeprazole (PRILOSEC) 20 MG capsule Take 1 capsule (20 mg total) by mouth daily. 30 capsule 11   No current facility-administered medications for this visit.    REVIEW OF SYSTEMS:  [X]  denotes positive finding, [ ]  denotes negative finding Cardiac  Comments:  Chest pain or chest pressure:    Shortness of breath upon exertion:    Short of breath when lying flat:    Irregular heart rhythm:        Vascular    Pain in calf, thigh, or hip brought on by ambulation:    Pain in feet at night that wakes you  up from your sleep:     Blood clot in your veins:    Leg swelling:         Pulmonary    Oxygen at  home:    Productive cough:     Wheezing:         Neurologic    Sudden weakness in arms or legs:     Sudden numbness in arms or legs:     Sudden onset of difficulty speaking or slurred speech:    Temporary loss of vision in one eye:     Problems with dizziness:         Gastrointestinal    Blood in stool:     Vomited blood:         Genitourinary    Burning when urinating:     Blood in urine:        Psychiatric    Major depression:         Hematologic    Bleeding problems:    Problems with blood clotting too easily:        Skin    Rashes or ulcers:        Constitutional    Fever or chills:      PHYSICAL EXAM: Filed Vitals:   09/12/15 1111  BP: 136/74  Pulse: 55  Height: 5\' 8"  (1.727 m)  Weight: 132 lb 6.4 oz (60.056 kg)  SpO2: 100%    GENERAL: The patient is a well-nourished female, in no acute distress. The vital signs are documented above. CARDIAC: There is a regular rate and rhythm.  VASCULAR: I do not detect carotid bruits. On the right side she has a palpable femoral pulse. I cannot palpate pedal pulses. However, the right foot is warm and well-perfused. On the left side, she has a palpable femoral pulse. She also has a palpable posterior tibial pulse. PULMONARY: There is good air exchange bilaterally without wheezing or rales. ABDOMEN: Soft and non-tender with normal pitched bowel sounds. Her aneurysm is palpable and nontender. MUSCULOSKELETAL: There are no major deformities or cyanosis. NEUROLOGIC: No focal weakness or paresthesias are detected. SKIN: There are no ulcers or rashes noted. PSYCHIATRIC: The patient has a normal affect.  DATA:   DUPLEX OF ABDOMINAL AORTA: have reviewed the duplex of the abdominal aorta which was performed on 08/29/2015. The maximum diameter of the infrarenal aorta was 2.7 cm. The right common iliac artery measured 1.4 cm in  maximum diameter. The left common iliac artery measured 1.7 cm in maximum diameter.  I reviewed her CT scan from 10/28/2014. At that time the infrarenal aorta measured 2.7 cm in maximum diameter. The right common iliac artery measured 1.4 cm in maximum diameter. The left common iliac artery measured 1.6 cm in maximum diameter.  MEDICAL ISSUES:   ABDOMINAL AORTIC ANEURYSM: This patient has a very small 2.7 cm infrarenal abdominal aortic aneurysm. The iliac arteries also somewhat ectatic. The right common iliac artery measures 1.4 cm and diameter and is stable over the last year. The left common iliac artery measures 1.7 cm in diameter and is stable over the last year. I've explained we would not consider elective repair of an aneurysm unless it was 5.5 cm in maximum diameter for the abdominal aorta, and 3.5 cm in maximum diameter for the common iliac artery. Normally and would recommend a follow up ultrasound in 2 years. However, the patient is very concerned about her aneurysms and would feel much more comfortable if we assess this in one year which I think is reasonable. I ordered a follow up ultrasound in 1 year and I'll see her back at that time. She is to call sooner if she has problems. Fortunately  she is not a smoker. Her blood pressure is under good control.  Deitra Mayo [facility] [place] [phone]

## 2015-10-02 DIAGNOSIS — K130 Diseases of lips: Secondary | ICD-10-CM | POA: Diagnosis not present

## 2015-10-02 DIAGNOSIS — Z85820 Personal history of malignant melanoma of skin: Secondary | ICD-10-CM | POA: Diagnosis not present

## 2015-10-06 DIAGNOSIS — M5136 Other intervertebral disc degeneration, lumbar region: Secondary | ICD-10-CM | POA: Diagnosis not present

## 2015-10-06 DIAGNOSIS — M4150 Other secondary scoliosis, site unspecified: Secondary | ICD-10-CM | POA: Diagnosis not present

## 2015-10-06 DIAGNOSIS — M545 Low back pain: Secondary | ICD-10-CM | POA: Diagnosis not present

## 2015-10-08 ENCOUNTER — Ambulatory Visit (INDEPENDENT_AMBULATORY_CARE_PROVIDER_SITE_OTHER): Payer: Medicare Other | Admitting: *Deleted

## 2015-10-08 ENCOUNTER — Encounter: Payer: Self-pay | Admitting: Internal Medicine

## 2015-10-08 DIAGNOSIS — I420 Dilated cardiomyopathy: Secondary | ICD-10-CM

## 2015-10-08 DIAGNOSIS — I429 Cardiomyopathy, unspecified: Secondary | ICD-10-CM | POA: Diagnosis not present

## 2015-10-08 LAB — CUP PACEART INCLINIC DEVICE CHECK
Implantable Lead Implant Date: 20070309
Implantable Lead Location: 753858
Implantable Lead Location: 753859
Implantable Lead Model: 4087
Implantable Lead Serial Number: 121652
Implantable Lead Serial Number: 268502
Lead Channel Impedance Value: 543 Ohm
Lead Channel Pacing Threshold Amplitude: 1.6 V
Lead Channel Pacing Threshold Pulse Width: 1 ms
Lead Channel Sensing Intrinsic Amplitude: 2.5 mV
Lead Channel Sensing Intrinsic Amplitude: 7 mV
Lead Channel Setting Pacing Amplitude: 2 V
Lead Channel Setting Sensing Sensitivity: 2.5 mV
Lead Channel Setting Sensing Sensitivity: 2.5 mV
MDC IDC LEAD IMPLANT DT: 20070309
MDC IDC LEAD IMPLANT DT: 20070309
MDC IDC LEAD LOCATION: 753860
MDC IDC LEAD MODEL: 158
MDC IDC LEAD SERIAL: 168416
MDC IDC MSMT LEADCHNL LV IMPEDANCE VALUE: 656 Ohm
MDC IDC MSMT LEADCHNL LV PACING THRESHOLD PULSEWIDTH: 1 ms
MDC IDC MSMT LEADCHNL RA PACING THRESHOLD AMPLITUDE: 0.7 V
MDC IDC MSMT LEADCHNL RA PACING THRESHOLD PULSEWIDTH: 0.4 ms
MDC IDC MSMT LEADCHNL RV IMPEDANCE VALUE: 389 Ohm
MDC IDC MSMT LEADCHNL RV PACING THRESHOLD AMPLITUDE: 2.9 V
MDC IDC MSMT LEADCHNL RV SENSING INTR AMPL: 14.5 mV
MDC IDC PG SERIAL: 100543
MDC IDC SESS DTM: 20170802040000
MDC IDC SET LEADCHNL LV PACING AMPLITUDE: 2.5 V
MDC IDC SET LEADCHNL LV PACING PULSEWIDTH: 1 ms
MDC IDC SET LEADCHNL RV PACING AMPLITUDE: 4 V
MDC IDC SET LEADCHNL RV PACING PULSEWIDTH: 1 ms

## 2015-10-08 NOTE — Patient Instructions (Signed)
We will call you with Dr. Tanna Furry recommendations when he is back in office. We have scheduled you an appointment pending these recommendations.

## 2015-10-08 NOTE — Progress Notes (Signed)
CRT-P device check in clinic. Normal device function. Thresholds, sensing, impedance consistent with previous measurements. Histograms appropriate for patient and level of activity. 48 NSVT EGMs available indicate AT.  2.9hr total of AF <1%. Will review with GT. ROV with Tommye Standard 10/28/2015 to discuss [place]. Patient bi-ventricularly pacing 99% of the time. EGMs suggest undersensing of afib, atrial sensitivity reprogrammed from 0.80mV to 0.69mV. RV output programmed from 3.5V to 4.0V. Device programmed with appropriate safety margins. Estimated longevity 3 years. ROV with Renee 10/28/2015. ROV with GT 04/08/2016.

## 2015-10-10 DIAGNOSIS — J018 Other acute sinusitis: Secondary | ICD-10-CM | POA: Diagnosis not present

## 2015-10-14 ENCOUNTER — Encounter: Payer: Self-pay | Admitting: Physician Assistant

## 2015-10-20 ENCOUNTER — Telehealth: Payer: Self-pay | Admitting: *Deleted

## 2015-10-20 DIAGNOSIS — I4891 Unspecified atrial fibrillation: Secondary | ICD-10-CM

## 2015-10-20 NOTE — Telephone Encounter (Signed)
Ms. Allison calling about feeling her "heart beating hard." She denies SOB, CP, muscle twitching/hiccupping sensation that would suggest diaphragmatic stimulation. She says that she has checked her heart rate a couple of times today and it was in the 60s. She is concerned because she has her PPM set at 55bpm. I tried to explain that her HR could be above 55bpm but the PPM would not prevent increased heart rates.  She then goes on to explain that she is nervous about the finding of AFib on 10/08/15 appt with device clinic (<1%, 2.9hr total 04/09/15- 10/08/15, findings also suggest undersensing AFib, so potentially a higher AF burden than recorded by PPM). She is scheduled with Tommye Standard, PA to discuss [facility] and AFib. She wants a sooner appt- none available with EP, NP or PA. She is not comfortable with the diagnosis of Afib and says she feels like she is "going to die." I again asked about her symptoms, she reiterates that she is nervous and brings up how her partner of many years developed AFib and died shortly afterwards. I offered her another appt with device clinic but I wasn't sure that it would change the course of action. She declines. She asked that I send this information to Dr. [name] to make him aware. I will do so.

## 2015-10-22 ENCOUNTER — Telehealth: Payer: Self-pay | Admitting: Cardiology

## 2015-10-22 DIAGNOSIS — I959 Hypotension, unspecified: Secondary | ICD-10-CM

## 2015-10-22 DIAGNOSIS — I420 Dilated cardiomyopathy: Secondary | ICD-10-CM

## 2015-10-22 MED ORDER — TRANDOLAPRIL 2 MG PO TABS: ORAL_TABLET | ORAL | 3 refills | Status: DC

## 2015-10-22 NOTE — Telephone Encounter (Signed)
Please call,she needs to discuss some things with you.

## 2015-10-22 NOTE — Telephone Encounter (Signed)
Returned call to patient.She stated she wanted Dr.Jordan to know she had PM checked 10/08/15 and was told she was in AFib 2 hours over the last 6 months.Stated she had episode this past Monday hard heart beat that lasted appox 1 hour.Stated she has appointment to see Tommye Standard PA 10/28/15.Advised Dr.Jordan is out of office this week.Advised I will let him know next week.

## 2015-10-26 NOTE — Telephone Encounter (Signed)
She has a high [place] Vasc score and given Afib should be on anticoagulation. Would check a BMET and CBC. Would then have her see Pharmacy to initiate anticoagulation with NOAC.   /Peter [name] MD, [facility]

## 2015-10-28 ENCOUNTER — Encounter: Payer: Medicare Other | Admitting: Physician Assistant

## 2015-10-31 ENCOUNTER — Telehealth: Payer: Self-pay | Admitting: Cardiology

## 2015-10-31 NOTE — Telephone Encounter (Signed)
Spoke to patient 10/27/15 Dr.Jordan advised her to keep appointment with Tommye Standard PA 10/28/15. After reviewing patient's chart noticed patient cancelled appointment with Tommye Standard PA. Left message on patient's voice mail to call me.

## 2015-10-31 NOTE — Telephone Encounter (Signed)
Spoke to [place] 10/27/15.He advised to keep appointment with Tommye Standard PA  10/28/15.

## 2015-10-31 NOTE — Addendum Note (Signed)
Addended by: Kathyrn Lass on: 10/31/2015 05:28 PM   Modules accepted: Orders

## 2015-10-31 NOTE — Telephone Encounter (Signed)
See previous 10/31/15 phone note.

## 2015-10-31 NOTE — Telephone Encounter (Signed)
Spoke to patient.Dr.Jordan advised needs bmet,cbc.Schedule appointment with pharmacist to initiate anticoagulation.Advised our scheduler will make pharmacist appointment for next week.

## 2015-10-31 NOTE — Telephone Encounter (Signed)
New Message  Pt voiced she is returning Cheryl's call regarding what MD [name] related to the nurse.  Please follow up with pt. Thanks!

## 2015-11-03 ENCOUNTER — Telehealth: Payer: Self-pay | Admitting: Cardiology

## 2015-11-03 DIAGNOSIS — I4891 Unspecified atrial fibrillation: Secondary | ICD-10-CM | POA: Diagnosis not present

## 2015-11-03 NOTE — Telephone Encounter (Signed)
Called the patient and left a voicemail for her to call back and schedule a new coumadin appointment.

## 2015-11-04 LAB — BASIC METABOLIC PANEL
BUN: 17 mg/dL (ref 7–25)
CALCIUM: 9.2 mg/dL (ref 8.6–10.4)
CHLORIDE: 104 mmol/L (ref 98–110)
CO2: 28 mmol/L (ref 20–31)
CREATININE: 0.68 mg/dL (ref 0.60–0.88)
GLUCOSE: 81 mg/dL (ref 65–99)
Potassium: 4.8 mmol/L (ref 3.5–5.3)
Sodium: 138 mmol/L (ref 135–146)

## 2015-11-04 LAB — CBC WITH DIFFERENTIAL/PLATELET
BASOS ABS: 0 {cells}/uL (ref 0–200)
Basophils Relative: 0 %
EOS ABS: 276 {cells}/uL (ref 15–500)
Eosinophils Relative: 4 %
HCT: 40.7 % (ref 35.0–45.0)
HEMOGLOBIN: 13.4 g/dL (ref 11.7–15.5)
Lymphocytes Relative: 21 %
Lymphs Abs: 1449 cells/uL (ref 850–3900)
MCH: 29.7 pg (ref 27.0–33.0)
MCHC: 32.9 g/dL (ref 32.0–36.0)
MCV: 90.2 fL (ref 80.0–100.0)
MONOS PCT: 9 %
MPV: 10 fL (ref 7.5–12.5)
Monocytes Absolute: 621 cells/uL (ref 200–950)
NEUTROS ABS: 4554 {cells}/uL (ref 1500–7800)
NEUTROS PCT: 66 %
PLATELETS: 193 10*3/uL (ref 140–400)
RBC: 4.51 MIL/uL (ref 3.80–5.10)
RDW: 13.5 % (ref 11.0–15.0)
WBC: 6.9 10*3/uL (ref 3.8–10.8)

## 2015-11-04 NOTE — Telephone Encounter (Addendum)
Patient called lab results given.Advised to keep appointment with pharmacist 11/07/15 at 12 :15 pm to start coumadin.Stated she is unsure if she needs coumadin.Advised to bring a list of questions to address with pharmacist.

## 2015-11-07 ENCOUNTER — Ambulatory Visit (INDEPENDENT_AMBULATORY_CARE_PROVIDER_SITE_OTHER): Payer: Medicare Other | Admitting: Pharmacist

## 2015-11-07 ENCOUNTER — Telehealth: Payer: Self-pay | Admitting: Pharmacist

## 2015-11-07 DIAGNOSIS — Z7901 Long term (current) use of anticoagulants: Secondary | ICD-10-CM | POA: Diagnosis not present

## 2015-11-07 DIAGNOSIS — I480 Paroxysmal atrial fibrillation: Secondary | ICD-10-CM | POA: Insufficient documentation

## 2015-11-07 MED ORDER — APIXABAN 5 MG PO TABS: 5.0000 mg | ORAL_TABLET | Freq: Two times a day (BID) | ORAL | 1 refills | Status: DC

## 2015-11-07 MED ORDER — APIXABAN 5 MG PO TABS: 5.0000 mg | ORAL_TABLET | Freq: Two times a day (BID) | ORAL | 0 refills | Status: DC

## 2015-11-07 NOTE — Telephone Encounter (Signed)
-----   Message from Peter M [name], MD sent at 11/07/2015  4:35 PM EDT ----- Yes she should stop ASA  Peter [name] MD, [facility]  ----- Message ----- From: Erskine Emery, [facility] Sent: 11/07/2015   4:28 PM To: Peter M [name], MD  Patient started Eliquis today for Afib. She is wanting to know if she can stop her aspirin?  Thanks!  Georgina Peer

## 2015-11-07 NOTE — Progress Notes (Signed)
Pt was started on Eliquis for Afib on 11/07/15  Reviewed patients medication list.  Pt is not currently on any combined P-gp and strong CYP3A4 inhibitors/inducers (ketoconazole, traconazole, ritonavir, carbamazepine, phenytoin, rifampin, St. John's wort).  Reviewed labs.  SCr 0.68, Weight 60.5 kgs, .  Dose appropriate based on age, weight, and SCr.  Hgb and HCT WNL.   A full discussion of the nature of anticoagulants has been carried out.  A benefit/risk analysis has been presented to the patient, so that they understand the justification for choosing anticoagulation with Eliquis at this time.  The need for compliance is stressed.  Pt is aware to take the medication twice daily.  Side effects of potential bleeding are discussed, including unusual colored urine or stools, coughing up blood or coffee ground emesis, nose bleeds or serious fall or head trauma.  Discussed signs and symptoms of stroke. The patient should avoid any OTC items containing aspirin or ibuprofen.  Avoid alcohol consumption.   Call if any signs of abnormal bleeding.  Discussed financial obligations and resolved any difficulty in obtaining medication.    She states she fluctuates weight 2-3 lbs (134-136 lbs) usually.  Her weight is close to meeting criteria for dose reduction since she is 82 years old. Since her kidneys are functioning well will continue 5mg  BID dosing and monitor changes in weight. She has follow up schedule with Dr. [name] in October for new onset Afib.

## 2015-11-07 NOTE — Patient Instructions (Signed)
Start Eliquis twice daily about 12 hours apart

## 2015-11-07 NOTE — Telephone Encounter (Signed)
Called patient to let know Dr. Doug Sou recommendation.   She will stop aspirin and continue just with Eliquis

## 2015-11-11 ENCOUNTER — Other Ambulatory Visit: Payer: Self-pay | Admitting: *Deleted

## 2015-11-11 DIAGNOSIS — G4700 Insomnia, unspecified: Secondary | ICD-10-CM

## 2015-11-11 MED ORDER — LEVOTHYROXINE SODIUM 112 MCG PO TABS: ORAL_TABLET | ORAL | 1 refills | Status: DC

## 2015-11-11 MED ORDER — ALPRAZOLAM 0.25 MG PO TABS: ORAL_TABLET | ORAL | 0 refills | Status: DC

## 2015-11-11 NOTE — Telephone Encounter (Signed)
Patient requested. Faxed to pharmacy.  

## 2015-11-12 ENCOUNTER — Encounter (INDEPENDENT_AMBULATORY_CARE_PROVIDER_SITE_OTHER): Payer: Self-pay

## 2015-11-12 ENCOUNTER — Encounter: Payer: Self-pay | Admitting: Physician Assistant

## 2015-11-12 ENCOUNTER — Ambulatory Visit (INDEPENDENT_AMBULATORY_CARE_PROVIDER_SITE_OTHER): Payer: Medicare Other | Admitting: Physician Assistant

## 2015-11-12 VITALS — BP 140/78 | HR 55 | Ht 68.0 in | Wt 134.0 lb

## 2015-11-12 DIAGNOSIS — I447 Left bundle-branch block, unspecified: Secondary | ICD-10-CM

## 2015-11-12 DIAGNOSIS — I4891 Unspecified atrial fibrillation: Secondary | ICD-10-CM | POA: Diagnosis not present

## 2015-11-12 DIAGNOSIS — I429 Cardiomyopathy, unspecified: Secondary | ICD-10-CM

## 2015-11-12 DIAGNOSIS — I10 Essential (primary) hypertension: Secondary | ICD-10-CM

## 2015-11-12 NOTE — Progress Notes (Signed)
Cardiology Office Note Date:  11/12/2015  Patient ID:  [name], [name] 09/29/1932, MRN [id number] PCP:  Jeanmarie Hubert, MD  Cardiologist:  Dr. [name] Electrophysiologist: Dr. Lovena Le   Chief Complaint:  New AFib  History of Present Illness: [name] is a 82 y.o. female with history of NICM, w/ CRT-P, LBBB, HTN, hypothyroidism, melanoma, anxiety/depressionsmall infrarenal AAA, seen by vascular surgeon, no need for intervention, recommended 2 year f/u scan though pt preferred 1 year, chronic abd discomfort after a number of abd surgeries,  and fairly new finding of PAFib.  She comes in today to be seen for Dr.Taylor last seen by him in Feb, at that time with some palpitations, no arrhythmias on her device check, noting only PAC's/PVCs.  She was noted at a devie clinic visit to have 2.9 hours of AF and subsequently started on Eliquis via Dr. [name].  Outside of some anxiety associated with the thought of having AF, she feels well, had no symptoms, was unaware of any arrhythmia, no CP, palpitations or SOB, no dizziness, near syncope or syncope.  She is taking the Eliquis as Rx, without bleeding or signs of bleeding.  She reports significant stress with the recent death of her long standing partner.  Device History: BSCi CRT-P, 04/26/12, original device (ICD) implanted 05/14/05,  Dr. Lovena Le   Past Medical History:  Diagnosis Date  . Anxiety disorder   . Barrett's esophagus 04/2008  . Benign neoplasm of colon   . CHF (congestive heart failure) ([place])   . Chronic airway obstruction, not elsewhere classified   . Complication of anesthesia    slow to wake up  . Depressive disorder, not elsewhere classified   . Diverticulitis   . Dysthymic disorder   . Erosive gastritis 08/2004  . GERD (gastroesophageal reflux disease)   . Hair thinning   . HTN (hypertension)   . Hyperplastic polyps of stomach 06/2002  . Hypothyroidism   . ICD (implantable cardiac defibrillator) in place    BiV/ICD; s/p  removal of ICD with insertion of BiV PPM 04/26/12  . Insomnia 08/20/2015  . Kyphosis 08/20/2015  . LBBB (left bundle branch block)   . Melanoma ([place])    right arm  . Mitral regurgitation   . Nonischemic cardiomyopathy ([place])    EF initially 20%; Last measurement up to 50% per echo in August of 2012  . Other and unspecified hyperlipidemia   . Other and unspecified hyperlipidemia   . Pacemaker 04/26/2012  . Pain in joint, lower leg   . Palpitations   . Restless legs syndrome (RLS)   . Sleep apnea   . Synovial cyst of popliteal space   . Unspecified chronic bronchitis ([place])   . Unspecified nasal polyp   . Unspecified sinusitis (chronic)     Past Surgical History:  Procedure Laterality Date  . ABDOMINAL HYSTERECTOMY  1076   endometriosis  . APPENDECTOMY    . BI-VENTRICULAR PACEMAKER INSERTION N/A 04/26/2012   Procedure: BI-VENTRICULAR PACEMAKER INSERTION (CRT-P);  Surgeon: Evans Lance, MD;  Location: [facility] CATH LAB;  Service: Cardiovascular;  Laterality: N/A;  . defibrillator insertion     s/p removal of previously implanted BiV ICD and insertion of a new BiV pacemaker on 04/26/12  . excise mole of lip  2001   Dr. Larena Sox  . excision of wen  1984   Dr. Parks Ranger  . FEMORAL HERNIA REPAIR  12/25/2012   Dr Dalbert Batman  . FEMORAL HERNIA REPAIR  01/04/2013   Recurrent -  Dr. Lilyan Punt  . INCISIONAL HERNIA REPAIR N/A 10/01/2013   Procedure: LAPAROSCOPIC INCISIONAL HERNIA;  Surgeon: Gayland Curry, MD;  Location: [facility] ORS;  Service: General;  Laterality: N/A;  . INGUINAL HERNIA REPAIR Right 09/26/2012   Procedure: RIGHT INGUINAL HERNIA REPAIR WITH MESH;  Surgeon: Odis Hollingshead, MD;  Location: [place];  Service: General;  Laterality: Right;  . INGUINAL HERNIA REPAIR Right 12/25/2012   Procedure: explor right groin, small bowel rescection, tissue repair right femoral hernia;  Surgeon: Adin Hector, MD;  Location: [facility];  Service: General;  Laterality: Right;  . INSERTION OF MESH Right 09/26/2012    Procedure: INSERTION OF MESH;  Surgeon: Odis Hollingshead, MD;  Location: [place];  Service: General;  Laterality: Right;  . INSERTION OF MESH N/A 10/01/2013   Procedure: INSERTION OF MESH;  Surgeon: Gayland Curry, MD;  Location: [facility];  Service: General;  Laterality: N/A;  . LAPAROSCOPY N/A 01/04/2013   Procedure: Diagnostic Laparoscopy, exploratory laparotomy with small bowel resection, closure of right femoral hernia repair;  Surgeon: Madilyn Hook, DO;  Location: [facility];  Service: General;  Laterality: N/A;  . Left arm surgery    . RIGHT BREAST LUMPECTOMY  1988   Dr. Marylene Buerger  . right knee surgery     arthroscopy: Dr. Shellia Carwin  . TONSILLECTOMY      Current Outpatient Prescriptions  Medication Sig Dispense Refill  . acetaminophen (TYLENOL) 500 MG tablet Take 500 mg by mouth every 6 (six) hours as needed for pain. For pain    . ALPRAZolam (XANAX) 0.25 MG tablet Take One tablet by mouth up to twice daily if needed for anxiety or sleep 60 tablet 0  . apixaban (ELIQUIS) 5 MG TABS tablet Take 1 tablet (5 mg total) by mouth 2 (two) times daily. 180 tablet 1  . Ascorbic Acid (VITAMIN C) 1000 MG tablet Take 1,000 mg by mouth daily.    Marland Kitchen aspirin 81 MG tablet Take 81 mg by mouth daily.     . Biotin 5000 MCG TABS Take 1 tablet by mouth daily.    . Calcium Carbonate-Vitamin D (CALCIUM + D PO) Take 2 tablets by mouth daily.     . Cyanocobalamin (VITAMIN B 12 PO) Take 1 tablet by mouth daily. Vitamin B12    . doxepin (SINEQUAN) 25 MG capsule One nightly to help nerves and sleep. 60 capsule 5  . ESTRACE VAGINAL 0.1 MG/GM vaginal cream Place 1 g vaginally 2 (two) times a week.     . furosemide (LASIX) 20 MG tablet Take 1 tablet (20 mg total) by mouth daily as needed for fluid or edema. 30 tablet 6  . hydroxypropyl methylcellulose (ISOPTO TEARS) 2.5 % ophthalmic solution Place 1 drop into both eyes 2 (two) times daily as needed for dry eyes.    Marland Kitchen levothyroxine (SYNTHROID, LEVOTHROID) 112 MCG tablet  Take one tablet by mouth once daily 30 minutes before breakfast for thyroid. 90 tablet 1  . Magnesium 250 MG TABS Take 1 tablet by mouth daily.     . Multiple Vitamin (MULTIVITAMIN WITH MINERALS) TABS Take 1 tablet by mouth daily.    . potassium chloride SA (K-DUR,KLOR-CON) 20 MEQ tablet Take 1 tablet (20 mEq total) by mouth daily as needed (only with lasix). 30 tablet 6  . RABEprazole (ACIPHEX) 20 MG tablet Take one tablet by mouth once daily for stomach 90 tablet 3  . trandolapril (MAVIK) 2 MG tablet One daily to strengthen the heart 90  tablet 3   No current facility-administered medications for this visit.     Allergies:   Hydrocodone; Cephalexin; Ciprofloxacin; Doxycycline; Escitalopram; Levofloxacin; Moxifloxacin; Ofloxacin; Sertraline; Tape; Latex; and Neomycin   Social History:  The patient  reports that she quit smoking about 21 years ago. Her smoking use included Cigarettes. She has never used smokeless tobacco. She reports that she does not drink alcohol or use drugs.   Family History:  The patient's family history includes Breast cancer in her maternal aunt; Colon cancer in her paternal uncle; Heart disease in her other; Stroke in her father.  ROS:  Please see the history of present illness.  All other systems are reviewed and otherwise negative.   PHYSICAL EXAM:  VS:  BP 140/78   Pulse (!) 55   Ht 5\' 8"  (1.727 m)   Wt 134 lb (60.8 kg)   BMI 20.37 kg/m  BMI: Body mass index is 20.37 kg/m. Well nourished, well developed, in no acute distress, appears younger then her age  51: normocephalic, atraumatic  Neck: no JVD, carotid bruits or masses Cardiac:  RRR; no significant murmurs, no rubs, or gallops Lungs:  clear to auscultation bilaterally, no wheezing, rhonchi or rales  Abd: soft, nontender MS: no deformity or atrophy Ext: no edema  Skin: warm and dry, no rash Neuro:  No gross deficits appreciated Psych: euthymic mood, full affect  PPM site is stable, no tethering  or discomfort   EKG:  AV paced Device interrogation today: normal device function 2 high V rate episodes very brief 1:1, Atach, no further AF episodes, chronically elevated RV threshold, 99% BiVe pacing  07/17/15: Echocardiogram Study Conclusions - Left ventricle: The cavity size was normal. Systolic function was   normal. Wall motion was normal; there were no regional wall   motion abnormalities. - Aortic valve: There was trivial regurgitation. - Mitral valve: There was mild regurgitation. - Left atrium: The atrium was mildly dilated.  Recent Labs: 08/18/2015: ALT 22; Magnesium 1.9; TSH 0.931 11/03/2015: BUN 17; Creat 0.68; Hemoglobin 13.4; Platelets 193; Potassium 4.8; Sodium 138  08/18/2015: Chol/HDL Ratio 2.4; Cholesterol, Total 168; HDL 71; LDL Calculated 79; Triglycerides 92   Estimated Creatinine Clearance: 52.9 mL/min (by C-G formula based on SCr of 0.8 mg/dL).   Wt Readings from Last 3 Encounters:  11/12/15 134 lb (60.8 kg)  11/07/15 133 lb 8 oz (60.6 kg)  09/12/15 132 lb 6.4 oz (60.1 kg)     Other studies reviewed: Additional studies/records reviewed today include: summarized above  ASSESSMENT AND PLAN:   1. New PAFib     CHA2DS2Vasc is at least 4, started on Eliquis     No bleeding reported.       We discussed at length PAFib, [id number] score, embolic/stroke risk, risk/benefit of full a/c.     Discussed bleeding precautions and signs of bleeding, no hematological history, no recent surgeries     She is quite anxious about AF and potential stroke, her weight is just above 60kg, discussed dose adjustment, her weights over the last few visits tend to be just above 60kg, with efforts on her side of getting some weight back on.       Will not make any changes at this time, monitor her AF burden via her device, given her anxiety and concerns will have early f/u in 40mo, recheck her weight as well.  2. Hx NICM with normalized EF now w/ CRT-P     device function  3.  HTN  stable   Disposition: Stop ASA (she states she had though still on her list)  f/u visit in 1 months, sooner if needed.  Current medicines are reviewed at length with the patient today.  The patient did not have any concerns regarding medicines.  Haywood Lasso, PA-C 11/12/2015 3:24 PM     [address] [place] [address] [phone] (office)  [phone] (fax)

## 2015-11-12 NOTE — Patient Instructions (Signed)
Medication Instructions:   Your physician recommends that you continue on your current medications as directed. Please refer to the Current Medication list given to you today.   If you need a refill on your cardiac medications before your next appointment, please call your pharmacy.  Labwork: NONE ORDER TODAY    Testing/Procedures: NONE ORDER TODAY    Follow-Up: IN ONE MONTH WITH RENEE URSUY    Any Other Special Instructions Will Be Listed Below (If Applicable).

## 2015-11-17 ENCOUNTER — Telehealth: Payer: Self-pay | Admitting: Cardiology

## 2015-11-17 MED ORDER — RIVAROXABAN 20 MG PO TABS: 20.0000 mg | ORAL_TABLET | Freq: Every day | ORAL | 0 refills | Status: DC

## 2015-11-17 NOTE — Telephone Encounter (Signed)
New message    Pt c/o medication issue:  1. Name of Medication: Eliquis  2. How are you currently taking this medication (dosage and times per day)? 5 mg twice a day   3. Are you having a reaction (difficulty breathing--STAT)? no 4. What is your medication issue? Pt is itching and afraid that she is allergic to the medication

## 2015-11-17 NOTE — Telephone Encounter (Signed)
Returned call to pt - has been itching on head, back, neck and chest. Itching started about 2-3 days ago.   Did not associate with medicine at first. Did have small rash under her breast. Not usual for her.   Used cortisone cream and did help some.   No shortness of breath but pt is VERY apprehensive about reaction.   Will change to Xarelto. Crcl 62. Will start Xarelto 20mg  with supper daily. She will start Xarelto tonight. Stop Eliquis.  Left 30 day copay card for pt at front desk. She states appreciation and will pick up card and medication this afternoon.

## 2015-11-19 ENCOUNTER — Telehealth: Payer: Self-pay | Admitting: Pharmacist

## 2015-11-19 NOTE — Telephone Encounter (Signed)
Patient still having itching on Xarelto - started on Monday. Last dose of Eliquis on Sunday.   States that she is extremely itchy during the night and this has progressively gotten worse (last night being the worst it has been).  Denies SOB, but does state she is extremely anxious over this. She states she thinks blood thinners are too much for her and she may not need one  She denies any other changes except medication and states that she is certain it is the medication causing this.   Offered to change over to warfarin. She states she wants to know what Renee or Dr. [name] recommend. Told her I was fairly certain that she would need to be on something to prevent stroke due to Afib. .   Will route to them for input as she does not wish to change to warfarin solely on my recommendation.

## 2015-11-20 ENCOUNTER — Telehealth: Payer: Self-pay | Admitting: Cardiology

## 2015-11-20 NOTE — Telephone Encounter (Signed)
Returned call to patient.She stated she has been upset.Patient was tearful.Stated she was unable to take Eliquis caused itching.Stated she was switched to Xarelto and that caused itching too.Stated pharmacist sent a message to [place] and she has not heard back.Advised Dr.Jordan out of office.Stated she is still taking Xarelto.Stated she takes a antihistamine when she takes Xarelto and that helps some.Stated she knows the next medication is coumadin and she does not want to take.Stated she wants Dr.Jordan's advice.Patient was reassured.Advised to keep taking Xarelto and I will speak to Dr.Jordan on Tues 11/25/15 and call her back.

## 2015-11-20 NOTE — Telephone Encounter (Signed)
New Message  Pt voiced she's not having any CP or SOB.  Pt voiced she didn't have anything else she'd like to include in the message just to speak with Malachy Mood.  Please f/u with pt

## 2015-11-24 ENCOUNTER — Telehealth: Payer: Self-pay | Admitting: Physician Assistant

## 2015-11-24 NOTE — Telephone Encounter (Signed)
I called and spoke with the patient this morning regarding her c/o itching with the Eliquis and then Xarelto as well.  She states it is generally her torso that itches, no noted rash, no SOB or swelling of her face, tongue or lips.  She is anxious about it, but states feels better after discussing it with me, and also concerned about being without a/c and stroke risk.  She would prefer coumadin to be the last option.  Discussed Pradaxa as a possibility.  She was nervous about trying another medicine though as well.  I told her I would discuss it with Dr. Lovena Le and get his opinion given her concerns and call her back.  I reviewed this with dr. Lovena Le, he recommends trying Pradaxa and if she has itching with this then coumadin.   I called the patient back, though needed to leave a message letting her know I spoke to Dr. Lovena Le for her to call back to discuss his recommendations.  I will call her again in the morning.

## 2015-11-24 NOTE — Telephone Encounter (Signed)
Follow Up:; ° ° °Returning your call. °

## 2015-11-24 NOTE — Telephone Encounter (Signed)
I will call the patient, [name]

## 2015-11-25 NOTE — Telephone Encounter (Signed)
Returned call to patient.Spoke to [name] he advised to schedule appointment with pharmacist to start on coumadin.Advised scheduler will call back with appointment.

## 2015-11-25 NOTE — Telephone Encounter (Signed)
I spoke with the patient this morning, she continues to have some itching, again no other symptoms, regarding Dr. Tanna Furry recommendation to switch to Pradaxa for a/c (and if unable to tolerate it then would try coumadin) she is hesitant, states a pharmacist had told her friend that they wouldn't use that medicine on anyone.  She does not recall the specifics of that conversation.   She prefers at this time to f/u with Dr. [name] and hear what his recommendation is and have him manage this for her.    I assurred her I/we remained available for any follow up and questions/concerns she has.  She stated appreciation for the this and will f/u with Dr. [name] at her scheduled appointment with him, sooner if he feels needed, and see EP service for planned follow up for her PPM.  Tommye Standard, PA-C

## 2015-11-27 ENCOUNTER — Ambulatory Visit (INDEPENDENT_AMBULATORY_CARE_PROVIDER_SITE_OTHER): Payer: Medicare Other | Admitting: Pharmacist

## 2015-11-27 DIAGNOSIS — I4891 Unspecified atrial fibrillation: Secondary | ICD-10-CM | POA: Diagnosis not present

## 2015-11-27 MED ORDER — WARFARIN SODIUM 5 MG PO TABS: 5.0000 mg | ORAL_TABLET | Freq: Every day | ORAL | 0 refills | Status: DC

## 2015-11-27 NOTE — Patient Instructions (Addendum)
Start taking warfarin 5mg  daily tonight.   Take warfarin and Xarelto together for 3 days then stop Xarelto and take only warfarin.   Come for INR check in 7 days.   Please call clinic with any questions or concerns.

## 2015-11-27 NOTE — Progress Notes (Signed)
Patient ID: [name]                 DOB: January 28, 1933                      MRN: [id number]     HPI: [name] is a 82 y.o. female patient of Dr. [name] with [facility] who presents today for anticoagulation follow up.  She was started on Eliquis and developed severe itching after about 5 days. The itching was localized to her midsection and head.   She was changed to Xarelto as a result of this itching. She states after the change the itching actually got worse. To the point she wakes at night itching. She reports that she developed a rash on her underarms and under her breasts. She is unsure if this rash is related to the medication or her itching so badly.   She states she has taken benedryl, but this has only provided some relief.   Dr. Lovena Le had recommended a change to Pradaxa, but she states that her friend's pharmacist told her that she would not use Pradaxa in any of her patients. We discussed that we typically use it less frequently in our clinic due to the GI side effects, but that it was still a perfectly safe drug to use as long as she did not experience these side effects. She stated that she compared the inactive ingredients in Xarelto and Eliquis and that 8 of them were the same between the two medications. Looking at the comparison warfarin shares less inactive ingredients with these medications than Pradaxa.    Renal function: CrCl cannot be calculated (Unknown ideal weight.).  Past Medical History:  Diagnosis Date  . Anxiety disorder   . Barrett's esophagus 04/2008  . Benign neoplasm of colon   . CHF (congestive heart failure) ([place])   . Chronic airway obstruction, not elsewhere classified   . Complication of anesthesia    slow to wake up  . Depressive disorder, not elsewhere classified   . Diverticulitis   . Dysthymic disorder   . Erosive gastritis 08/2004  . GERD (gastroesophageal reflux disease)   . Hair thinning   . HTN (hypertension)   . Hyperplastic  polyps of stomach 06/2002  . Hypothyroidism   . ICD (implantable cardiac defibrillator) in place    BiV/ICD; s/p removal of ICD with insertion of BiV PPM 04/26/12  . Insomnia 08/20/2015  . Kyphosis 08/20/2015  . LBBB (left bundle branch block)   . Melanoma ([place])    right arm  . Mitral regurgitation   . Nonischemic cardiomyopathy ([place])    EF initially 20%; Last measurement up to 50% per echo in August of 2012  . Other and unspecified hyperlipidemia   . Other and unspecified hyperlipidemia   . Pacemaker 04/26/2012  . Pain in joint, lower leg   . Palpitations   . Restless legs syndrome (RLS)   . Sleep apnea   . Synovial cyst of popliteal space   . Unspecified chronic bronchitis ([place])   . Unspecified nasal polyp   . Unspecified sinusitis (chronic)     Current Outpatient Prescriptions on File Prior to Visit  Medication Sig Dispense Refill  . acetaminophen (TYLENOL) 500 MG tablet Take 500 mg by mouth every 6 (six) hours as needed for pain. For pain    . ALPRAZolam (XANAX) 0.25 MG tablet Take One tablet by mouth up to twice daily if needed for anxiety or sleep  60 tablet 0  . Ascorbic Acid (VITAMIN C) 1000 MG tablet Take 1,000 mg by mouth daily.    . Biotin 5000 MCG TABS Take 1 tablet by mouth daily.    . Calcium Carbonate-Vitamin D (CALCIUM + D PO) Take 2 tablets by mouth daily.     . Cyanocobalamin (VITAMIN B 12 PO) Take 1 tablet by mouth daily. Vitamin B12    . doxepin (SINEQUAN) 25 MG capsule One nightly to help nerves and sleep. 60 capsule 5  . ESTRACE VAGINAL 0.1 MG/GM vaginal cream Place 1 g vaginally 2 (two) times a week.     . furosemide (LASIX) 20 MG tablet Take 1 tablet (20 mg total) by mouth daily as needed for fluid or edema. 30 tablet 6  . hydroxypropyl methylcellulose (ISOPTO TEARS) 2.5 % ophthalmic solution Place 1 drop into both eyes 2 (two) times daily as needed for dry eyes.    Marland Kitchen levothyroxine (SYNTHROID, LEVOTHROID) 112 MCG tablet Take one tablet by mouth once daily 30  minutes before breakfast for thyroid. 90 tablet 1  . Magnesium 250 MG TABS Take 1 tablet by mouth daily.     . Multiple Vitamin (MULTIVITAMIN WITH MINERALS) TABS Take 1 tablet by mouth daily.    . potassium chloride SA (K-DUR,KLOR-CON) 20 MEQ tablet Take 1 tablet (20 mEq total) by mouth daily as needed (only with lasix). 30 tablet 6  . RABEprazole (ACIPHEX) 20 MG tablet Take one tablet by mouth once daily for stomach 90 tablet 3  . trandolapril (MAVIK) 2 MG tablet One daily to strengthen the heart 90 tablet 3  . [DISCONTINUED] omeprazole (PRILOSEC) 20 MG capsule Take 1 capsule (20 mg total) by mouth daily. 30 capsule 11   No current facility-administered medications on file prior to visit.     Allergies  Allergen Reactions  . Hydrocodone Itching  . Cephalexin Itching and Swelling  . Ciprofloxacin     Not indicated due to aneurysm in aorta    . Doxycycline Other (See Comments)    Per pt: unknown  . Escitalopram     Dry mouth  . Levofloxacin     Not indicated due to aneurysm in aorta   . Moxifloxacin     Not indicated due to aneurysm in aorta   . Ofloxacin Other (See Comments)    Per pt: unknown  . Sertraline     insomnia  . Tape Other (See Comments)    Burns skin.   . Latex Rash    "If pt. Makes contact with or wearing"  . Neomycin Rash    There were no vitals taken for this visit.   Assessment/Plan: Anticoagulation: Patient is extremely leery of taking a blood thinner at all. After comparing inactive ingredients she would prefer to be switched to warfarin. Will have her do 3 day overlap as she says she can tolerate the Xarelto to prevent a stroke. After the 3 day overlap she will continue on just warfarin and return for follow up in 1 week.   A full discussion of the nature of anticoagulants has been carried out.  A benefit risk analysis has been presented to the patient, so that they understand the justification for choosing anticoagulation at this time. The need for  frequent and regular monitoring, precise dosage adjustment and compliance is stressed.  Side effects of potential bleeding are discussed.  The patient should avoid any OTC items containing aspirin or ibuprofen, and should avoid great swings in general diet.  Avoid alcohol  consumption.  Call if any signs of abnormal bleeding.     Thank you, Lelan Pons. Patterson Hammersmith, [facility]  11/27/2015 4:06 PM

## 2015-12-04 ENCOUNTER — Ambulatory Visit (INDEPENDENT_AMBULATORY_CARE_PROVIDER_SITE_OTHER): Payer: Medicare Other | Admitting: Pharmacist Clinician (PhC)/ Clinical Pharmacy Specialist

## 2015-12-04 DIAGNOSIS — Z7901 Long term (current) use of anticoagulants: Secondary | ICD-10-CM | POA: Diagnosis not present

## 2015-12-04 DIAGNOSIS — I4891 Unspecified atrial fibrillation: Secondary | ICD-10-CM | POA: Diagnosis not present

## 2015-12-04 LAB — POCT INR: INR: 3.9

## 2015-12-05 ENCOUNTER — Telehealth: Payer: Self-pay | Admitting: Cardiology

## 2015-12-05 NOTE — Telephone Encounter (Signed)
Spoke w/ pt and she stated that she doesn't have a home monitor. She prefers to come to the office every 6 months for her PPM to be checked. Canceled appt and pt is aware that she will receive a letter around December to call and scheduler 6 month follow up w/ MD.

## 2015-12-05 NOTE — Telephone Encounter (Signed)
°  New Prob   Pt has a question regarding her remote check scheduled in December. Please call.

## 2015-12-11 ENCOUNTER — Ambulatory Visit (INDEPENDENT_AMBULATORY_CARE_PROVIDER_SITE_OTHER): Payer: Medicare Other | Admitting: Pharmacist

## 2015-12-11 DIAGNOSIS — I4891 Unspecified atrial fibrillation: Secondary | ICD-10-CM

## 2015-12-11 DIAGNOSIS — Z7901 Long term (current) use of anticoagulants: Secondary | ICD-10-CM

## 2015-12-11 LAB — POCT INR: INR: 4

## 2015-12-16 ENCOUNTER — Ambulatory Visit
Admission: RE | Admit: 2015-12-16 | Discharge: 2015-12-16 | Disposition: A | Discharge status: Home / Self Care | Payer: Medicare Other | Source: Ambulatory Visit | Attending: Internal Medicine | Admitting: Internal Medicine

## 2015-12-16 ENCOUNTER — Encounter: Payer: Self-pay | Admitting: Internal Medicine

## 2015-12-16 ENCOUNTER — Ambulatory Visit: Payer: Medicare Other

## 2015-12-16 ENCOUNTER — Ambulatory Visit (INDEPENDENT_AMBULATORY_CARE_PROVIDER_SITE_OTHER): Payer: Medicare Other | Admitting: Internal Medicine

## 2015-12-16 VITALS — BP 116/72 | HR 55 | Temp 97.4°F | Ht 68.0 in | Wt 132.0 lb

## 2015-12-16 DIAGNOSIS — R05 Cough: Secondary | ICD-10-CM | POA: Diagnosis not present

## 2015-12-16 DIAGNOSIS — I77819 Aortic ectasia, unspecified site: Secondary | ICD-10-CM | POA: Diagnosis not present

## 2015-12-16 DIAGNOSIS — Z7901 Long term (current) use of anticoagulants: Secondary | ICD-10-CM

## 2015-12-16 DIAGNOSIS — J42 Unspecified chronic bronchitis: Secondary | ICD-10-CM | POA: Diagnosis not present

## 2015-12-16 DIAGNOSIS — I723 Aneurysm of iliac artery: Secondary | ICD-10-CM

## 2015-12-16 DIAGNOSIS — F411 Generalized anxiety disorder: Secondary | ICD-10-CM | POA: Diagnosis not present

## 2015-12-16 NOTE — Progress Notes (Signed)
Facility  [place]    Place of Service:   OFFICE    Allergies  Allergen Reactions  . Hydrocodone Itching  . Cephalexin Itching and Swelling  . Ciprofloxacin     Not indicated due to aneurysm in aorta    . Doxycycline Other (See Comments)    Per pt: unknown  . Escitalopram     Dry mouth  . Levofloxacin     Not indicated due to aneurysm in aorta   . Moxifloxacin     Not indicated due to aneurysm in aorta   . Ofloxacin Other (See Comments)    Per pt: unknown  . Sertraline     insomnia  . Tape Other (See Comments)    Burns skin.   . Latex Rash    "If pt. Makes contact with or wearing"  . Neomycin Rash    Chief Complaint  Patient presents with  . Medical Management of Chronic Issues    3 month medication management thyroid, CHF, depression    HPI:  Last OV with me on 08/20/15. Hypothyroid, GERD, HLD, Depression (prescribed Sinequan), chronic low back pain, and insomnia.  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 Mild ectasia of the right common iliac artery to 1.4 cm. And left common iliac artery to 1.7 cm. Saw Dr. Scot Dock, who recommended FU in 2 years.  Continues on warfarin for PAFlutter. Was on Eliquis initially, then began itching an was switched to Xarelto on 11/17/15. Started itching and was then changed to warfarin.11/19/15.  Persistent cough and sputum production.  No fever.   Medications: Patient's Medications  New Prescriptions   No medications on file  Previous Medications   ACETAMINOPHEN (TYLENOL) 500 MG TABLET    Take 500 mg by mouth every 6 (six) hours as needed for pain. For pain   ALPRAZOLAM (XANAX) 0.25 MG TABLET    Take One tablet by mouth up to twice daily if needed for anxiety or sleep   ASCORBIC ACID (VITAMIN C) 1000 MG TABLET    Take 1,000 mg by mouth daily.   BIOTIN 5000 MCG TABS    Take 1 tablet by mouth daily.   CALCIUM CARBONATE-VITAMIN D (CALCIUM + D PO)    Take 2 tablets by mouth daily.    CYANOCOBALAMIN (VITAMIN B 12 PO)    Take 1  tablet by mouth daily. Vitamin B12   DOXEPIN (SINEQUAN) 25 MG CAPSULE    One nightly to help nerves and sleep.   ESTRACE VAGINAL 0.1 MG/GM VAGINAL CREAM    Place 1 g vaginally 2 (two) times a week.    FUROSEMIDE (LASIX) 20 MG TABLET    Take 1 tablet (20 mg total) by mouth daily as needed for fluid or edema.   HYDROXYPROPYL METHYLCELLULOSE (ISOPTO TEARS) 2.5 % OPHTHALMIC SOLUTION    Place 1 drop into both eyes 2 (two) times daily as needed for dry eyes.   LEVOTHYROXINE (SYNTHROID, LEVOTHROID) 112 MCG TABLET    Take one tablet by mouth once daily 30 minutes before breakfast for thyroid.   MAGNESIUM 250 MG TABS    Take 1 tablet by mouth daily.    MULTIPLE VITAMIN (MULTIVITAMIN WITH MINERALS) TABS    Take 1 tablet by mouth daily.   POTASSIUM CHLORIDE SA (K-DUR,KLOR-CON) 20 MEQ TABLET    Take 1 tablet (20 mEq total) by mouth daily as needed (only with lasix).   RABEPRAZOLE (ACIPHEX) 20 MG TABLET    Take one tablet by mouth  once daily for stomach   TRANDOLAPRIL (MAVIK) 2 MG TABLET    One daily to strengthen the heart   WARFARIN (COUMADIN) 5 MG TABLET    Take 1 tablet (5 mg total) by mouth daily.  Modified Medications   No medications on file  Discontinued Medications   No medications on file    Review of Systems  Constitutional: Positive for appetite change, fatigue and unexpected weight change. Negative for chills and fever.  HENT: Positive for congestion, hearing loss, postnasal drip, rhinorrhea and sinus pressure. Negative for ear discharge, ear pain, facial swelling, nosebleeds, sneezing and tinnitus.   Eyes: Negative.   Respiratory: Positive for cough and shortness of breath. Negative for wheezing.   Cardiovascular: Positive for palpitations. Negative for chest pain and leg swelling.  Gastrointestinal: Negative for abdominal distention, abdominal pain, blood in stool, diarrhea and nausea.       Hx erosive gastritis, HH, Barrett's esophagus. Tender RUQ and RLQ.  Endocrine: Negative.     Genitourinary:       Nocturia x 3. Hx of vaginal discomfort. Uses hormone cream from GYN, Dr. Nori Riis  Musculoskeletal: Positive for arthralgias, back pain, neck pain and neck stiffness. Negative for gait problem, joint swelling and myalgias.  Skin: Negative for pallor, rash and wound.       S/P right herniorraphy. Right index finger has onycholysis distally and tenderness.  Neurological: Positive for dizziness, numbness and headaches. Negative for tremors, seizures, syncope and speech difficulty.  Hematological: Negative.   Psychiatric/Behavioral: Positive for dysphoric mood. The patient is nervous/anxious.     Vitals:   12/16/15 1528  BP: 116/72  Pulse: (!) 55  Temp: 97.4 F (36.3 C)  TempSrc: Oral  SpO2: 98%  Weight: 132 lb (59.9 kg)  Height: '5\' 8"'$  (1.727 m)   Body mass index is 20.07 kg/m. Wt Readings from Last 3 Encounters:  12/16/15 132 lb (59.9 kg)  11/12/15 134 lb (60.8 kg)  11/07/15 133 lb 8 oz (60.6 kg)      Physical Exam  Constitutional:  Frail, thin body. Losing weight.  HENT:  Right Ear: External ear normal.  Left Ear: External ear normal.  Mouth/Throat: Oropharynx is clear and moist.  Nasal polyp  Eyes:  Corrective lensees  Neck: No JVD present. No tracheal deviation present. No thyromegaly present.  stiff  Cardiovascular: Normal rate and regular rhythm.  Exam reveals no gallop and no friction rub.   No murmur heard. AICD in left upper chest. Raynaud's disease.  Pulmonary/Chest: Effort normal. No respiratory distress. She has no wheezes. She has rales. She exhibits no tenderness.  Abdominal: Soft. Bowel sounds are normal. She exhibits no distension and no mass. Tenderness: RLQ.  Right inguinal area has scar from surgery.   Musculoskeletal: Normal range of motion. She exhibits no edema.  Mild tenderness in low back . Left ulnar bursa enlargement has resolved.  Lymphadenopathy:    She has no cervical adenopathy.  Neurological:  Reduced vibratory  sensation in both feet.  Skin: No rash noted. No erythema. No pallor.  Onycholysis of right index fingeer. Tender to palpation.  Psychiatric:  Anxious. Depressed affect. Low energy.    Labs reviewed: Lab Summary Latest Ref Rng & Units 11/03/2015 08/18/2015 05/16/2015  Hemoglobin 11.7 - 15.5 g/dL 13.4 (None) (None)  Hematocrit 35.0 - 45.0 % 40.7 (None) (None)  White count 3.8 - 10.8 K/uL 6.9 (None) (None)  Platelet count 140 - 400 K/uL 193 (None) (None)  Sodium 135 - 146 mmol/L 138 137  137  Potassium 3.5 - 5.3 mmol/L 4.8 4.4 4.1  Calcium 8.6 - 10.4 mg/dL 9.2 9.3 9.2  Phosphorus - (None) (None) (None)  Creatinine 0.60 - 0.88 mg/dL 0.68 0.76 0.72  AST 0 - 40 IU/L (None) 30 21  Alk Phos 39 - 117 IU/L (None) 82 83  Bilirubin 0.0 - 1.2 mg/dL (None) 0.4 0.3  Glucose 65 - 99 mg/dL 81 81 76  Cholesterol - (None) (None) (None)  HDL cholesterol >39 mg/dL (None) 71 70  Triglycerides 0 - 149 mg/dL (None) 92 84  LDL Direct - (None) (None) (None)  LDL Calc 0 - 99 mg/dL (None) 79 68  Total protein - (None) (None) (None)  Albumin 3.5 - 4.7 g/dL (None) 4.3 4.3  Some recent data might be hidden   Lab Results  Component Value Date   TSH 0.931 08/18/2015   TSH 0.990 05/16/2015   TSH 1.200 10/18/2014   Lab Results  Component Value Date   BUN 17 11/03/2015   BUN 18 08/18/2015   BUN 14 05/16/2015   No results found for: HGBA1C  Assessment/Plan  1. Chronic bronchitis, unspecified chronic bronchitis type ([place]) - DG Chest 2 View; Future  2. Aortic ectasia (HCC) chronic  3. Aneurysm of iliac artery (HCC) chronic  4. Generalized anxiety disorder Try Sinequan  5. Long term current use of anticoagulant therapy Continue warfarin clinic instructions.

## 2015-12-17 ENCOUNTER — Ambulatory Visit (INDEPENDENT_AMBULATORY_CARE_PROVIDER_SITE_OTHER): Payer: Medicare Other | Admitting: Pharmacist

## 2015-12-17 DIAGNOSIS — I4891 Unspecified atrial fibrillation: Secondary | ICD-10-CM | POA: Diagnosis not present

## 2015-12-17 DIAGNOSIS — Z7901 Long term (current) use of anticoagulants: Secondary | ICD-10-CM

## 2015-12-17 LAB — POCT INR: INR: 2.9

## 2015-12-22 ENCOUNTER — Encounter: Payer: Medicare Other | Admitting: Physician Assistant

## 2015-12-22 ENCOUNTER — Telehealth: Payer: Self-pay | Admitting: Pharmacist

## 2015-12-22 NOTE — Telephone Encounter (Signed)
Pt with UTI will be seen tomorrow by PCP and wanted to see if ok to start whatever ABX she may be given. She has INR check on Thursday will address abx at that appt.

## 2015-12-23 DIAGNOSIS — N390 Urinary tract infection, site not specified: Secondary | ICD-10-CM | POA: Diagnosis not present

## 2015-12-23 DIAGNOSIS — N760 Acute vaginitis: Secondary | ICD-10-CM | POA: Diagnosis not present

## 2015-12-24 ENCOUNTER — Telehealth: Payer: Self-pay | Admitting: *Deleted

## 2015-12-24 NOTE — Telephone Encounter (Signed)
Patient was seen at [facility] yesterday and had a UTI. Was given Nitrofurantoin. The insert on the medication stated not to get immunizations while on the medication and patient has a flu shot scheduled here on Friday. She wants to know if its ok to go ahead and get the flu shot with the warning or should she just wait. Please Advise.

## 2015-12-24 NOTE — Telephone Encounter (Signed)
I would wait until after she finishes the nitrofurantoin.

## 2015-12-25 ENCOUNTER — Ambulatory Visit (INDEPENDENT_AMBULATORY_CARE_PROVIDER_SITE_OTHER): Payer: Medicare Other | Admitting: Pharmacist

## 2015-12-25 DIAGNOSIS — Z7901 Long term (current) use of anticoagulants: Secondary | ICD-10-CM

## 2015-12-25 DIAGNOSIS — I4891 Unspecified atrial fibrillation: Secondary | ICD-10-CM | POA: Diagnosis not present

## 2015-12-25 LAB — POCT INR: INR: 3.5

## 2015-12-25 MED ORDER — WARFARIN SODIUM 5 MG PO TABS: 5.0000 mg | ORAL_TABLET | Freq: Every day | ORAL | 1 refills | Status: DC

## 2015-12-25 NOTE — Telephone Encounter (Signed)
Patient notified

## 2015-12-26 ENCOUNTER — Ambulatory Visit: Payer: Medicare Other

## 2015-12-29 ENCOUNTER — Ambulatory Visit (INDEPENDENT_AMBULATORY_CARE_PROVIDER_SITE_OTHER): Payer: Medicare Other | Admitting: Pharmacist

## 2015-12-29 DIAGNOSIS — Z7901 Long term (current) use of anticoagulants: Secondary | ICD-10-CM

## 2015-12-29 DIAGNOSIS — I4891 Unspecified atrial fibrillation: Secondary | ICD-10-CM

## 2015-12-29 LAB — POCT INR: INR: 2.4

## 2016-01-01 ENCOUNTER — Ambulatory Visit: Payer: Medicare Other

## 2016-01-06 ENCOUNTER — Other Ambulatory Visit: Payer: Self-pay | Admitting: *Deleted

## 2016-01-06 MED ORDER — ALPRAZOLAM 0.25 MG PO TABS: ORAL_TABLET | ORAL | 3 refills | Status: DC

## 2016-01-06 NOTE — Telephone Encounter (Signed)
[facility] 

## 2016-01-07 ENCOUNTER — Telehealth: Payer: Self-pay | Admitting: Pharmacist Clinician (PhC)/ Clinical Pharmacy Specialist

## 2016-01-07 DIAGNOSIS — N760 Acute vaginitis: Secondary | ICD-10-CM | POA: Diagnosis not present

## 2016-01-07 DIAGNOSIS — N390 Urinary tract infection, site not specified: Secondary | ICD-10-CM | POA: Diagnosis not present

## 2016-01-07 NOTE — Telephone Encounter (Signed)
On new abx for UTI starting today - augmentin.  Assured her that this should be fine with warfarin unless it causes her to have diarrhea.  If so, she should let us know.  Will keep appointment already scheduled for next week

## 2016-01-09 ENCOUNTER — Telehealth: Payer: Self-pay | Admitting: Pharmacist

## 2016-01-09 NOTE — Telephone Encounter (Signed)
Pt fell and hit head yesterday. She is wondering if she should go to urgent care to be evaluated.   She states she does have a headache - I recommended she go be evaluated to be safe given that she hit head and has a headache. She stated she understood and would go to urgent care.

## 2016-01-10 ENCOUNTER — Ambulatory Visit (INDEPENDENT_AMBULATORY_CARE_PROVIDER_SITE_OTHER): Payer: Medicare Other | Admitting: Urgent Care

## 2016-01-10 VITALS — BP 106/78 | HR 60 | Temp 97.6°F | Resp 16 | Ht 68.0 in | Wt 133.0 lb

## 2016-01-10 DIAGNOSIS — Z5181 Encounter for therapeutic drug level monitoring: Secondary | ICD-10-CM

## 2016-01-10 DIAGNOSIS — R519 Headache, unspecified: Secondary | ICD-10-CM

## 2016-01-10 DIAGNOSIS — R51 Headache: Secondary | ICD-10-CM | POA: Diagnosis not present

## 2016-01-10 DIAGNOSIS — W19XXXA Unspecified fall, initial encounter: Secondary | ICD-10-CM | POA: Diagnosis not present

## 2016-01-10 DIAGNOSIS — Z7901 Long term (current) use of anticoagulants: Secondary | ICD-10-CM

## 2016-01-10 NOTE — Patient Instructions (Addendum)
An intracranial hemorrhage is a type of bleeding that occurs inside the skull. Symptoms include sudden tingling, weakness, numbness, paralysis, severe headache, difficulty with swallowing or vision, loss of balance or coordination, difficulty understanding, speaking , reading, or writing, and a change in level of consciousness or alertness, marked by stupor, lethargy, sleepiness, or coma. Any type of bleeding inside the skull or brain is a medical emergency. It is important to get the person to a hospital emergency room immediately to determine the cause of the bleeding and begin medical treatment.    IF you received an x-ray today, you will receive an invoice from [facility]. Please contact [facility] at [phone] with questions or concerns regarding your invoice.   IF you received labwork today, you will receive an invoice from [employer]. Please contact [facility] at [phone] with questions or concerns regarding your invoice.   Our billing staff will not be able to assist you with questions regarding bills from these companies.  You will be contacted with the lab results as soon as they are available. The fastest way to get your results is to activate your My Chart account. Instructions are located on the last page of this paperwork. If you have not heard from [place] regarding the results in 2 weeks, please contact this office.

## 2016-01-10 NOTE — Progress Notes (Signed)
MRN: [id number] DOB: 1932-08-12  Subjective:   [name] is a 82 y.o. female presenting for chief complaint of Fall (On Thursday, pt hit her head. She is on Warfarin and was told to get check out. )  Reports 2 day history of a fall. She was trying to turn on the TV and fell, hit the back of her head pretty hard. She subsequently had a mild headache. Has not tried any medications. Patient is very concerned about an intracranial bleed given that she is on Coumadin. She does have a PCP and was not able to get in for a visit with them. Denies loss of consciousness, dizziness, numbness or tingling, weakness, confusion, neck pain.   Occie has a current medication list which includes the following prescription(s): acetaminophen, alprazolam, vitamin c, biotin, calcium citrate-vitamin d, cyanocobalamin, estrace vaginal, furosemide, hydroxypropyl methylcellulose / hypromellose, levothyroxine, magnesium, multivitamin with minerals, potassium chloride sa, rabeprazole, trandolapril, warfarin, and doxepin. Also is allergic to hydrocodone; cephalexin; ciprofloxacin; doxycycline; escitalopram; levofloxacin; moxifloxacin; ofloxacin; sertraline; tape; latex; and neomycin.  Mylisha  has a past medical history of Anxiety disorder; Barrett's esophagus (04/2008); Benign neoplasm of colon; CHF (congestive heart failure) ([place]); Chronic airway obstruction, not elsewhere classified; Complication of anesthesia; Depressive disorder, not elsewhere classified; Diverticulitis; Dysthymic disorder; Erosive gastritis (08/2004); GERD (gastroesophageal reflux disease); Hair thinning; HTN (hypertension); Hyperplastic polyps of stomach (06/2002); Hypothyroidism; ICD (implantable cardiac defibrillator) in place; Insomnia (08/20/2015); Kyphosis (08/20/2015); LBBB (left bundle branch block); Melanoma ([place]); Mitral regurgitation; Nonischemic cardiomyopathy ([place]); Other and unspecified hyperlipidemia; Other and unspecified hyperlipidemia;  Pacemaker (04/26/2012); Pain in joint, lower leg; Palpitations; Restless legs syndrome (RLS); Sleep apnea; Synovial cyst of popliteal space; Unspecified chronic bronchitis; Unspecified nasal polyp; and Unspecified sinusitis (chronic). Also  has a past surgical history that includes defibrillator insertion; Left arm surgery; Abdominal hysterectomy (1076); RIGHT BREAST LUMPECTOMY (1988); Appendectomy; Tonsillectomy; right knee surgery; excision of wen (1984); excise mole of lip (2001); Inguinal hernia repair (Right, 09/26/2012); Insertion of mesh (Right, 09/26/2012); Inguinal hernia repair (Right, 12/25/2012); laparoscopy (N/A, 01/04/2013); Femoral hernia repair (12/25/2012); Femoral hernia repair (01/04/2013); Incisional hernia repair (N/A, 10/01/2013); Insertion of mesh (N/A, 10/01/2013); and bi-ventricular pacemaker insertion (N/A, 04/26/2012).  Objective:   Vitals: BP 106/78   Pulse 60   Temp 97.6 F (36.4 C) (Oral)   Resp 16   Ht 5\' 8"  (1.727 m)   Wt 133 lb (60.3 kg)   SpO2 99%   BMI 20.22 kg/m   Physical Exam  Constitutional: She is oriented to person, place, and time. She appears well-developed and well-nourished.  HENT:  No cranial depression/defect, crepitus, ecchymosis, lacerations, swelling, tenderness. TM's intact bilaterally, no effusions or erythema. Nasal turbinates pink and moist, nasal passages patent. No sinus tenderness. Oropharynx clear, mucous membranes moist, dentition in good repair.  Eyes: EOM are normal. Pupils are equal, round, and reactive to light.  Neck: Normal range of motion. Neck supple.  Cardiovascular: Normal rate, regular rhythm and intact distal pulses.  Exam reveals no gallop and no friction rub.   No murmur heard. Pulmonary/Chest: No respiratory distress. She has no wheezes. She has no rales.  Neurological: She is alert and oriented to person, place, and time. No cranial nerve deficit. Coordination normal.  Negative Romberg, pronator drift.  Skin: Skin is  warm and dry.   Assessment and Plan :   1. Acute nonintractable headache, unspecified headache type 2. Fall, initial encounter 3. Anticoagulated on Coumadin - Patient's physical exam very reassuring. Counseled patient extensively on this  and that a head CT would be inappropriate. I discussed signs of intracranial process, patient verbalized understanding and will call 911 or have a family member/friend take her to the ER as necessary.  Jaynee Eagles, PA-C [facility] [phone] 01/10/2016 3:55 PM

## 2016-01-13 ENCOUNTER — Ambulatory Visit (INDEPENDENT_AMBULATORY_CARE_PROVIDER_SITE_OTHER): Payer: Medicare Other | Admitting: Pharmacist

## 2016-01-13 DIAGNOSIS — Z7901 Long term (current) use of anticoagulants: Secondary | ICD-10-CM

## 2016-01-13 DIAGNOSIS — I4891 Unspecified atrial fibrillation: Secondary | ICD-10-CM

## 2016-01-13 LAB — POCT INR: INR: 2.2

## 2016-01-22 DIAGNOSIS — D1801 Hemangioma of skin and subcutaneous tissue: Secondary | ICD-10-CM | POA: Diagnosis not present

## 2016-01-22 DIAGNOSIS — D225 Melanocytic nevi of trunk: Secondary | ICD-10-CM | POA: Diagnosis not present

## 2016-01-22 DIAGNOSIS — L817 Pigmented purpuric dermatosis: Secondary | ICD-10-CM | POA: Diagnosis not present

## 2016-01-22 DIAGNOSIS — Z85820 Personal history of malignant melanoma of skin: Secondary | ICD-10-CM | POA: Diagnosis not present

## 2016-01-22 DIAGNOSIS — L821 Other seborrheic keratosis: Secondary | ICD-10-CM | POA: Diagnosis not present

## 2016-01-24 NOTE — Progress Notes (Signed)
[name] Date of Birth: 02/13/1933 Medical Record [id number]  History of Present Illness: [name] is seen today for follow up. She has multiple medical issues which include a cardiomyopathy, underlying BiV/ICD which resulted in improvement of her EF, LBBB, HTN, thyroid disease, melanoma, anxiety/depression and chronic systolic HF. EF was 55% by last Echo in 2/16 .   She had a difficult time with hernia surgery in 2014. She had a fourth surgery in July 2015 for correction of a ventral hernia. Following one of the surgeries she had a wide complex arrhythmia that was felt by Dr. Rayann Heman to be her intrinsic rhythm (over her Biv paced rhythm). This was felt to be related to increased adrenergic tone. She was managed with beta blocker and digoxin. Digoxin was later discontinued.   In September she was noted in device clinic to have paroxysmal Afib with controlled rate. She was started on Eliquis. [place] Vasc score of at least 4. She developed itching on Eliquis and switched to Xarelto. Itching did not go away so she was switched to Coumadin. After about a month the itching resolved.    She also was seen by vascular surgery this year. Abd US showed a 2.7 cm AAA and 1.7 cm left iliac aneurysm. Follow up in one year. She was seen for back pain by Dr. Louanne Skye. PT recommended but she hasn't started yet. She had a bad cough last month. CXR was clear.   On follow up today she is doing OK. No chest pain or SOB. No palpitations. No edema. Still very anxious and admits to continued depression. Has Doxepin but hasn't taken it.     Current Outpatient Prescriptions  Medication Sig Dispense Refill  . acetaminophen (TYLENOL) 500 MG tablet Take 500 mg by mouth every 6 (six) hours as needed for pain. For pain    . ALPRAZolam (XANAX) 0.25 MG tablet Take One tablet by mouth up to twice daily if needed for anxiety or sleep 60 tablet 3  . Ascorbic Acid (VITAMIN C) 1000 MG tablet Take 1,000 mg by mouth daily.    .  Biotin 5000 MCG TABS Take 1 tablet by mouth daily.    . Calcium Carbonate-Vitamin D (CALCIUM + D PO) Take 2 tablets by mouth daily.     . Cyanocobalamin (VITAMIN B 12 PO) Take 1 tablet by mouth daily. Vitamin B12    . doxepin (SINEQUAN) 25 MG capsule One nightly to help nerves and sleep. 60 capsule 5  . ESTRACE VAGINAL 0.1 MG/GM vaginal cream Place 1 g vaginally 2 (two) times a week.     . furosemide (LASIX) 20 MG tablet Take 1 tablet (20 mg total) by mouth daily as needed for fluid or edema. 30 tablet 6  . hydroxypropyl methylcellulose (ISOPTO TEARS) 2.5 % ophthalmic solution Place 1 drop into both eyes 2 (two) times daily as needed for dry eyes.    Marland Kitchen levothyroxine (SYNTHROID, LEVOTHROID) 112 MCG tablet Take one tablet by mouth once daily 30 minutes before breakfast for thyroid. 90 tablet 1  . Magnesium 250 MG TABS Take 1 tablet by mouth daily.     . Multiple Vitamin (MULTIVITAMIN WITH MINERALS) TABS Take 1 tablet by mouth daily.    . potassium chloride SA (K-DUR,KLOR-CON) 20 MEQ tablet Take 1 tablet (20 mEq total) by mouth daily as needed (only with lasix). 30 tablet 6  . RABEprazole (ACIPHEX) 20 MG tablet Take one tablet by mouth once daily for stomach 90 tablet 3  .  trandolapril (MAVIK) 2 MG tablet One daily to strengthen the heart 90 tablet 3  . warfarin (COUMADIN) 5 MG tablet Take 1 tablet (5 mg total) by mouth daily. 30 tablet 1   No current facility-administered medications for this visit.     Allergies  Allergen Reactions  . Hydrocodone Itching  . Cephalexin Itching and Swelling  . Ciprofloxacin     Not indicated due to aneurysm in aorta    . Doxycycline Other (See Comments)    Per pt: unknown  . Escitalopram     Dry mouth  . Levofloxacin     Not indicated due to aneurysm in aorta   . Moxifloxacin     Not indicated due to aneurysm in aorta   . Ofloxacin Other (See Comments)    Per pt: unknown  . Sertraline     insomnia  . Tape Other (See Comments)    Burns skin.   .  Latex Rash    "If pt. Makes contact with or wearing"  . Neomycin Rash    Past Medical History:  Diagnosis Date  . Anxiety disorder   . Barrett's esophagus 04/2008  . Benign neoplasm of colon   . CHF (congestive heart failure) ([place])   . Chronic airway obstruction, not elsewhere classified   . Complication of anesthesia    slow to wake up  . Depressive disorder, not elsewhere classified   . Diverticulitis   . Dysthymic disorder   . Erosive gastritis 08/2004  . GERD (gastroesophageal reflux disease)   . Hair thinning   . HTN (hypertension)   . Hyperplastic polyps of stomach 06/2002  . Hypothyroidism   . ICD (implantable cardiac defibrillator) in place    BiV/ICD; s/p removal of ICD with insertion of BiV PPM 04/26/12  . Insomnia 08/20/2015  . Kyphosis 08/20/2015  . LBBB (left bundle branch block)   . Melanoma ([place])    right arm  . Mitral regurgitation   . Nonischemic cardiomyopathy ([place])    EF initially 20%; Last measurement up to 50% per echo in August of 2012  . Other and unspecified hyperlipidemia   . Other and unspecified hyperlipidemia   . Pacemaker 04/26/2012  . Pain in joint, lower leg   . Palpitations   . Restless legs syndrome (RLS)   . Sleep apnea   . Synovial cyst of popliteal space   . Unspecified chronic bronchitis   . Unspecified nasal polyp   . Unspecified sinusitis (chronic)     Past Surgical History:  Procedure Laterality Date  . ABDOMINAL HYSTERECTOMY  1076   endometriosis  . APPENDECTOMY    . BI-VENTRICULAR PACEMAKER INSERTION N/A 04/26/2012   Procedure: BI-VENTRICULAR PACEMAKER INSERTION (CRT-P);  Surgeon: Evans Lance, MD;  Location: [facility] CATH LAB;  Service: Cardiovascular;  Laterality: N/A;  . defibrillator insertion     s/p removal of previously implanted BiV ICD and insertion of a new BiV pacemaker on 04/26/12  . excise mole of lip  2001   Dr. Larena Sox  . excision of wen  1984   Dr. Parks Ranger  . FEMORAL HERNIA REPAIR  12/25/2012   Dr Dalbert Batman  .  FEMORAL HERNIA REPAIR  01/04/2013   Recurrent - Dr. Lilyan Punt  . INCISIONAL HERNIA REPAIR N/A 10/01/2013   Procedure: LAPAROSCOPIC INCISIONAL HERNIA;  Surgeon: Gayland Curry, MD;  Location: [facility];  Service: General;  Laterality: N/A;  . INGUINAL HERNIA REPAIR Right 09/26/2012   Procedure: RIGHT INGUINAL HERNIA REPAIR WITH MESH;  Surgeon: Sherren Mocha  Adalberto Cole, MD;  Location: [place];  Service: General;  Laterality: Right;  . INGUINAL HERNIA REPAIR Right 12/25/2012   Procedure: explor right groin, small bowel rescection, tissue repair right femoral hernia;  Surgeon: Adin Hector, MD;  Location: [facility];  Service: General;  Laterality: Right;  . INSERTION OF MESH Right 09/26/2012   Procedure: INSERTION OF MESH;  Surgeon: Odis Hollingshead, MD;  Location: [place];  Service: General;  Laterality: Right;  . INSERTION OF MESH N/A 10/01/2013   Procedure: INSERTION OF MESH;  Surgeon: Gayland Curry, MD;  Location: [facility];  Service: General;  Laterality: N/A;  . LAPAROSCOPY N/A 01/04/2013   Procedure: Diagnostic Laparoscopy, exploratory laparotomy with small bowel resection, closure of right femoral hernia repair;  Surgeon: Madilyn Hook, DO;  Location: [facility];  Service: General;  Laterality: N/A;  . Left arm surgery    . RIGHT BREAST LUMPECTOMY  1988   Dr. Marylene Buerger  . right knee surgery     arthroscopy: Dr. Shellia Carwin  . TONSILLECTOMY      History  Smoking Status  . Former Smoker  . Types: Cigarettes  . Quit date: 12/22/1993  Smokeless Tobacco  . Never Used    Comment: Does'nt reall year quit     History  Alcohol Use No    Family History  Problem Relation Age of Onset  . Stroke Father   . Heart disease Other     maternal side  . Colon cancer Paternal Uncle   . Breast cancer Maternal Aunt   . Diabetes Neg Hx     Review of Systems: The review of systems is per the HPI.   All other systems were reviewed and are negative.  Physical Exam: BP 126/64 (BP Location: Right Arm, Patient Position:  Sitting, Cuff Size: Normal)   Pulse (!) 56   Ht 5\' 8"  (1.727 m)   Wt 135 lb 3.2 oz (61.3 kg)   BMI 20.56 kg/m  Patient is very pleasant and in no acute distress.Skin is warm and dry. Color is normal.  HEENT is unremarkable. Normocephalic/atraumatic. PERRL. Sclera are nonicteric. Neck is supple. No masses. No JVD. Lungs are clear. Cardiac exam shows a regular rate and rhythm. Normal S1-2. No gallop or murmur. Abdomen is soft. Extremities are without edema. Gait and ROM are intact. No gross neurologic deficits noted.  Wt Readings from Last 3 Encounters:  01/26/16 135 lb 3.2 oz (61.3 kg)  01/10/16 133 lb (60.3 kg)  12/16/15 132 lb (59.9 kg)     LABORATORY DATA: Lab Results  Component Value Date   WBC 6.9 11/03/2015   HGB 13.4 11/03/2015   HCT 40.7 11/03/2015   PLT 193 11/03/2015   GLUCOSE 81 11/03/2015   CHOL 168 08/18/2015   TRIG 92 08/18/2015   HDL 71 08/18/2015   LDLCALC 79 08/18/2015   ALT 22 08/18/2015   AST 30 08/18/2015   NA 138 11/03/2015   K 4.8 11/03/2015   CL 104 11/03/2015   CREATININE 0.68 11/03/2015   BUN 17 11/03/2015   CO2 28 11/03/2015   TSH 0.931 08/18/2015   INR 2.1 01/26/2016    Last ICD check in Sept showed biv pacing >99% of the time. 2.9 hours of Afib.  Echo: 07/17/15:  Study Conclusions  - Left ventricle: The cavity size was normal. Systolic function was   normal. Wall motion was normal; there were no regional wall   motion abnormalities. - Aortic valve: There was trivial regurgitation. -  Mitral valve: There was mild regurgitation. - Left atrium: The atrium was mildly dilated.    Assessment / Plan: 1.  History of dialted Cardiomyopathy with chronic systolic CHF.  EF now normal 55%- has BiV/ICD in place -  I think overall she is doing great.  Continue low dose ACEi. We stopped metoprolol due to Raynauds symptoms.   2. NSVT. Follow up ICD/biV in device clinic.   3. Paroxysmal Afib. [place] Vasc score of at least 4. On Coumadin now. Wants to  consider rechallenge with Xarelto. Will get guidance from pharmacy.   4. Small AAA and left iliac aneurysm. Repeat US one year.   I will follow up in 6 months.

## 2016-01-26 ENCOUNTER — Encounter: Payer: Self-pay | Admitting: Cardiology

## 2016-01-26 ENCOUNTER — Ambulatory Visit (INDEPENDENT_AMBULATORY_CARE_PROVIDER_SITE_OTHER): Payer: Medicare Other | Admitting: Cardiology

## 2016-01-26 ENCOUNTER — Ambulatory Visit (INDEPENDENT_AMBULATORY_CARE_PROVIDER_SITE_OTHER): Payer: Medicare Other | Admitting: Pharmacist Clinician (PhC)/ Clinical Pharmacy Specialist

## 2016-01-26 VITALS — BP 126/64 | HR 56 | Ht 68.0 in | Wt 135.2 lb

## 2016-01-26 DIAGNOSIS — I429 Cardiomyopathy, unspecified: Secondary | ICD-10-CM | POA: Diagnosis not present

## 2016-01-26 DIAGNOSIS — I5022 Chronic systolic (congestive) heart failure: Secondary | ICD-10-CM

## 2016-01-26 DIAGNOSIS — I4891 Unspecified atrial fibrillation: Secondary | ICD-10-CM

## 2016-01-26 DIAGNOSIS — Z23 Encounter for immunization: Secondary | ICD-10-CM | POA: Diagnosis not present

## 2016-01-26 DIAGNOSIS — Z7901 Long term (current) use of anticoagulants: Secondary | ICD-10-CM

## 2016-01-26 DIAGNOSIS — I420 Dilated cardiomyopathy: Secondary | ICD-10-CM

## 2016-01-26 LAB — POCT INR: INR: 2.1

## 2016-01-26 NOTE — Patient Instructions (Addendum)
We will give your flu vaccine  We will address your blood thinner  Continue your other therapy  I will see you in 6 months.

## 2016-02-03 ENCOUNTER — Telehealth: Payer: Self-pay | Admitting: Internal Medicine

## 2016-02-03 DIAGNOSIS — N390 Urinary tract infection, site not specified: Secondary | ICD-10-CM | POA: Diagnosis not present

## 2016-02-03 DIAGNOSIS — R300 Dysuria: Secondary | ICD-10-CM | POA: Diagnosis not present

## 2016-02-03 NOTE — Telephone Encounter (Signed)
left msg asking pt to confirm this AWV appt w/ nurse. VDM (DD) °

## 2016-02-04 ENCOUNTER — Telehealth: Payer: Self-pay | Admitting: Pharmacist

## 2016-02-04 MED ORDER — RIVAROXABAN 20 MG PO TABS: 20.0000 mg | ORAL_TABLET | Freq: Every day | ORAL | 1 refills | Status: DC

## 2016-02-04 NOTE — Telephone Encounter (Signed)
Pt called to report she has been taking Xarelto for 9 doses and so far doing well. She does state that she has another UTI and was concerned for bleeding in her urine. She states her urine is clear again though.   She called to have RX sent for Xarelto to mail order since her supply is getting low.   RX has been sent. She states she will call if she gets too low before mail order arrives. She states appreciation for help.

## 2016-02-17 ENCOUNTER — Ambulatory Visit (INDEPENDENT_AMBULATORY_CARE_PROVIDER_SITE_OTHER): Payer: Medicare Other | Admitting: Internal Medicine

## 2016-02-17 ENCOUNTER — Encounter: Payer: Self-pay | Admitting: Internal Medicine

## 2016-02-17 VITALS — BP 142/72 | HR 53 | Temp 97.4°F | Ht 68.0 in | Wt 133.0 lb

## 2016-02-17 DIAGNOSIS — R05 Cough: Secondary | ICD-10-CM | POA: Diagnosis not present

## 2016-02-17 DIAGNOSIS — J42 Unspecified chronic bronchitis: Secondary | ICD-10-CM

## 2016-02-17 DIAGNOSIS — R059 Cough, unspecified: Secondary | ICD-10-CM

## 2016-02-17 NOTE — Patient Instructions (Addendum)
Use Robitussin DM or Delsym for cough. Nasalcrom (cromolyn sodium) may be helpful for nasal allergies.

## 2016-02-17 NOTE — Addendum Note (Signed)
Addended by: Logan Bores on: 02/17/2016 03:25 PM   Modules accepted: Orders

## 2016-02-17 NOTE — Progress Notes (Signed)
Facility  [facility]    Place of Service:   OFFICE    Allergies  Allergen Reactions  . Hydrocodone Itching  . Cephalexin Itching and Swelling  . Ciprofloxacin     Not indicated due to aneurysm in aorta    . Doxycycline Other (See Comments)    Per pt: unknown  . Escitalopram     Dry mouth  . Levofloxacin     Not indicated due to aneurysm in aorta   . Moxifloxacin     Not indicated due to aneurysm in aorta   . Ofloxacin Other (See Comments)    Per pt: unknown  . Sertraline     insomnia  . Tape Other (See Comments)    Burns skin.   . Latex Rash    "If pt. Makes contact with or wearing"  . Neomycin Rash    Chief Complaint  Patient presents with  . Acute Visit    cough for about 2 months.     HPI:  Persistent cough. Last CXR on 01/01/16 showed no active lung disease. Cough seems worse than usual to her. Feeling weak and no energy. Headaches. No fever. No rigors or night sweats. She thought she had pneumonia.  Felt like she had another UTI. Took 2 nitrofurantoin on 12/23/15, then on 12/26/15 took 3 days of sulfameth-TMP. Saw Dr. Nori Riis and he gave her amox-clav x 7 days starting 01/07/16. On 02/03/16 took SMT-TMP for another 5 days. Dr. Nori Riis suggested urology consult.   Although she has been coughing up a lot of mucus it has been a cloudy yellow color.    Medications: Patient's Medications  New Prescriptions   No medications on file  Previous Medications   ACETAMINOPHEN (TYLENOL) 500 MG TABLET    Take 500 mg by mouth every 6 (six) hours as needed for pain. For pain   ALPRAZOLAM (XANAX) 0.25 MG TABLET    Take One tablet by mouth up to twice daily if needed for anxiety or sleep   ASCORBIC ACID (VITAMIN C) 1000 MG TABLET    Take 1,000 mg by mouth daily.   BIOTIN 5000 MCG TABS    Take 1 tablet by mouth daily.   CALCIUM CARBONATE-VITAMIN D (CALCIUM + D PO)    Take 2 tablets by mouth daily.    CYANOCOBALAMIN (VITAMIN B 12 PO)    Take 1 tablet by mouth daily. Vitamin B12   DOXEPIN (SINEQUAN) 25 MG CAPSULE    One nightly to help nerves and sleep.   ESTRACE VAGINAL 0.1 MG/GM VAGINAL CREAM    Place 1 g vaginally 2 (two) times a week.    FUROSEMIDE (LASIX) 20 MG TABLET    Take 1 tablet (20 mg total) by mouth daily as needed for fluid or edema.   HYDROXYPROPYL METHYLCELLULOSE (ISOPTO TEARS) 2.5 % OPHTHALMIC SOLUTION    Place 1 drop into both eyes 2 (two) times daily as needed for dry eyes.   LEVOTHYROXINE (SYNTHROID, LEVOTHROID) 112 MCG TABLET    Take one tablet by mouth once daily 30 minutes before breakfast for thyroid.   MAGNESIUM 250 MG TABS    Take 1 tablet by mouth daily.    MULTIPLE VITAMIN (MULTIVITAMIN WITH MINERALS) TABS    Take 1 tablet by mouth daily.   POTASSIUM CHLORIDE SA (K-DUR,KLOR-CON) 20 MEQ TABLET    Take 1 tablet (20 mEq total) by mouth daily as needed (only with lasix).   RABEPRAZOLE (ACIPHEX) 20 MG TABLET    Take one  tablet by mouth once daily for stomach   RIVAROXABAN (XARELTO) 20 MG TABS TABLET    Take 1 tablet (20 mg total) by mouth daily with supper.   TRANDOLAPRIL (MAVIK) 2 MG TABLET    One daily to strengthen the heart  Modified Medications   No medications on file  Discontinued Medications   No medications on file    Review of Systems  Constitutional: Positive for appetite change, fatigue and unexpected weight change. Negative for chills and fever.  HENT: Positive for congestion, hearing loss, postnasal drip, rhinorrhea and sinus pressure. Negative for ear discharge, ear pain, facial swelling, nosebleeds, sneezing and tinnitus.   Eyes: Negative.   Respiratory: Positive for cough and shortness of breath. Negative for wheezing.   Cardiovascular: Positive for palpitations. Negative for chest pain and leg swelling.  Gastrointestinal: Negative for abdominal distention, abdominal pain, blood in stool, diarrhea and nausea.       Hx erosive gastritis, HH, Barrett's esophagus. Tender RUQ and RLQ.  Endocrine: Negative.   Genitourinary:        Nocturia x 3. Hx of vaginal discomfort. Uses hormone cream from GYN, Dr. Nori Riis  Musculoskeletal: Positive for arthralgias, back pain, neck pain and neck stiffness. Negative for gait problem, joint swelling and myalgias.  Skin: Negative for pallor, rash and wound.       S/P right herniorraphy. Right index finger has onycholysis distally and tenderness.  Neurological: Positive for dizziness, numbness and headaches. Negative for tremors, seizures, syncope and speech difficulty.  Hematological: Negative.   Psychiatric/Behavioral: Positive for dysphoric mood. The patient is nervous/anxious.     Vitals:   02/17/16 1411  BP: (!) 142/72  Pulse: (!) 53  Temp: 97.4 F (36.3 C)  TempSrc: Oral  SpO2: 96%  Weight: 133 lb (60.3 kg)  Height: '5\' 8"'$  (1.727 m)   Body mass index is 20.22 kg/m. Wt Readings from Last 3 Encounters:  02/17/16 133 lb (60.3 kg)  01/26/16 135 lb 3.2 oz (61.3 kg)  01/10/16 133 lb (60.3 kg)      Physical Exam  Constitutional:  Frail, thin body. Losing weight.  HENT:  Right Ear: External ear normal.  Left Ear: External ear normal.  Mouth/Throat: Oropharynx is clear and moist.  Nasal polyp  Eyes:  Corrective lensees  Neck: No JVD present. No tracheal deviation present. No thyromegaly present.  stiff  Cardiovascular: Normal rate and regular rhythm.  Exam reveals no gallop and no friction rub.   No murmur heard. AICD in left upper chest. Raynaud's disease.  Pulmonary/Chest: Effort normal. No respiratory distress. She has no wheezes. She has rales. She exhibits no tenderness.  Abdominal: Soft. Bowel sounds are normal. She exhibits no distension and no mass. Tenderness: RLQ.  Right inguinal area has scar from surgery.   Musculoskeletal: Normal range of motion. She exhibits no edema.  Mild tenderness in low back . Left ulnar bursa enlargement has resolved.  Lymphadenopathy:    She has no cervical adenopathy.  Neurological:  Reduced vibratory sensation in both  feet.  Skin: No rash noted. No erythema. No pallor.  Onycholysis of right index fingeer. Tender to palpation.  Psychiatric:  Anxious. Depressed affect. Low energy.    Labs reviewed: Lab Summary Latest Ref Rng & Units 11/03/2015  Hemoglobin 11.7 - 15.5 g/dL 13.4  Hematocrit 35.0 - 45.0 % 40.7  White count 3.8 - 10.8 K/uL 6.9  Platelet count 140 - 400 K/uL 193  Sodium 135 - 146 mmol/L 138  Potassium 3.5 - 5.3  mmol/L 4.8  Calcium 8.6 - 10.4 mg/dL 9.2  Phosphorus - (None)  Creatinine 0.60 - 0.88 mg/dL 0.68  AST - (None)  Alk Phos - (None)  Bilirubin - (None)  Glucose 65 - 99 mg/dL 81  Cholesterol - (None)  HDL cholesterol - (None)  Triglycerides - (None)  LDL Direct - (None)  LDL Calc - (None)  Total protein - (None)  Albumin - (None)  Some recent data might be hidden   Lab Results  Component Value Date   TSH 0.931 08/18/2015   TSH 0.990 05/16/2015   TSH 1.200 10/18/2014   Lab Results  Component Value Date   BUN 17 11/03/2015   BUN 18 08/18/2015   BUN 14 05/16/2015   No results found for: HGBA1C  Assessment/Plan  1. Chronic bronchitis, unspecified chronic bronchitis type ([place]) - Culture, expectorated sputum-assessment  2. Cough - Culture, expectorated sputum-assessment

## 2016-02-18 ENCOUNTER — Other Ambulatory Visit: Payer: Self-pay

## 2016-02-18 ENCOUNTER — Other Ambulatory Visit: Payer: Medicare Other

## 2016-02-18 DIAGNOSIS — J42 Unspecified chronic bronchitis: Secondary | ICD-10-CM | POA: Diagnosis not present

## 2016-02-18 DIAGNOSIS — R059 Cough, unspecified: Secondary | ICD-10-CM

## 2016-02-18 DIAGNOSIS — R05 Cough: Secondary | ICD-10-CM

## 2016-02-21 LAB — RESPIRATORY CULTURE OR RESPIRATORY AND SPUTUM CULTURE
Gram Stain: NONE SEEN
ORGANISM ID, BACTERIA: NORMAL

## 2016-02-24 ENCOUNTER — Telehealth: Payer: Self-pay

## 2016-02-24 NOTE — Telephone Encounter (Signed)
Left message on voicemail with Dr.Green's response. Patient instructed to return call if questions or concerns  

## 2016-02-24 NOTE — Telephone Encounter (Signed)
1.) Patient has always taken brand name synthroid and the pharmacy recently gave her the generic. Patient would like to know what Dr.Green thinks about her taking the generic all of a sudden. Patient states " Im fine with taking the generic if Dr.Green thinks it is ok."   2.) Patient then mentioned that she is still with the cough from last OV 02/17/16  Please advise

## 2016-02-24 NOTE — Telephone Encounter (Signed)
The generic should be fine and will likely cost less.. I am Ok with this .  Try Delsym for cough. Available OTC.

## 2016-03-16 ENCOUNTER — Ambulatory Visit: Payer: Medicare Other

## 2016-03-21 ENCOUNTER — Other Ambulatory Visit: Payer: Self-pay | Admitting: Internal Medicine

## 2016-04-14 ENCOUNTER — Encounter: Payer: Self-pay | Admitting: Internal Medicine

## 2016-04-14 ENCOUNTER — Ambulatory Visit (INDEPENDENT_AMBULATORY_CARE_PROVIDER_SITE_OTHER): Payer: Medicare Other | Admitting: Internal Medicine

## 2016-04-14 VITALS — BP 142/70 | HR 55 | Ht 68.0 in | Wt 132.0 lb

## 2016-04-14 DIAGNOSIS — Z950 Presence of cardiac pacemaker: Secondary | ICD-10-CM

## 2016-04-14 NOTE — Progress Notes (Signed)
HPI Ms. Delgiorno returns today for followup. She is a very pleasant 82 year old woman with a nonischemic cardiomyopathy, chronic systolic heart failure, left bundle branch block, status post biventricular pacemaker insertion. In the interim, she denies chest pain, shortness of breath, or peripheral edema. No syncope.  She is anxious. She has not been in the hospital.  Allergies  Allergen Reactions  . Hydrocodone Itching  . Cephalexin Itching and Swelling  . Ciprofloxacin     Not indicated due to aneurysm in aorta    . Doxycycline Other (See Comments)    Per pt: unknown  . Escitalopram     Dry mouth  . Levofloxacin     Not indicated due to aneurysm in aorta   . Moxifloxacin     Not indicated due to aneurysm in aorta   . Ofloxacin Other (See Comments)    Per pt: unknown  . Sertraline     insomnia  . Tape Other (See Comments)    Burns skin.   . Latex Rash    "If pt. Makes contact with or wearing"  . Neomycin Rash     Current Outpatient Prescriptions  Medication Sig Dispense Refill  . acetaminophen (TYLENOL) 500 MG tablet Take 500 mg by mouth every 6 (six) hours as needed for pain. For pain    . ALPRAZolam (XANAX) 0.25 MG tablet Take One tablet by mouth up to twice daily if needed for anxiety or sleep 60 tablet 3  . Ascorbic Acid (VITAMIN C) 1000 MG tablet Take 1,000 mg by mouth daily.    . Biotin 5000 MCG TABS Take 1 tablet by mouth daily.    . Calcium Carbonate-Vitamin D (CALCIUM + D PO) Take 2 tablets by mouth daily.     . Cyanocobalamin (VITAMIN B 12 PO) Take 1 tablet by mouth daily. Vitamin B12    . ESTRACE VAGINAL 0.1 MG/GM vaginal cream Place 1 g vaginally 2 (two) times a week.     . furosemide (LASIX) 20 MG tablet Take 1 tablet (20 mg total) by mouth daily as needed for fluid or edema. 30 tablet 6  . hydroxypropyl methylcellulose (ISOPTO TEARS) 2.5 % ophthalmic solution Place 1 drop into both eyes 2 (two) times daily as needed for dry eyes.    Marland Kitchen levothyroxine (SYNTHROID,  LEVOTHROID) 112 MCG tablet TAKE 1 TABLET BY MOUTH EVERY DAY 30 MINUTES BEFORE BREAKFAST FOR THYROID 90 tablet 1  . Magnesium 250 MG TABS Take 1 tablet by mouth daily.     . Multiple Vitamin (MULTIVITAMIN WITH MINERALS) TABS Take 1 tablet by mouth daily.    . potassium chloride SA (K-DUR,KLOR-CON) 20 MEQ tablet Take 1 tablet (20 mEq total) by mouth daily as needed (only with lasix). 30 tablet 6  . RABEprazole (ACIPHEX) 20 MG tablet Take one tablet by mouth once daily for stomach 90 tablet 3  . rivaroxaban (XARELTO) 20 MG TABS tablet Take 1 tablet (20 mg total) by mouth daily with supper. 90 tablet 1  . trandolapril (MAVIK) 2 MG tablet One daily to strengthen the heart 90 tablet 3  . doxepin (SINEQUAN) 25 MG capsule One nightly to help nerves and sleep. (Patient not taking: Reported on 04/14/2016) 60 capsule 5   No current facility-administered medications for this visit.      Past Medical History:  Diagnosis Date  . Anxiety disorder   . Barrett's esophagus 04/2008  . Benign neoplasm of colon   . CHF (congestive heart failure) ([place])   . Chronic airway obstruction,  not elsewhere classified   . Complication of anesthesia    slow to wake up  . Depressive disorder, not elsewhere classified   . Diverticulitis   . Dysthymic disorder   . Erosive gastritis 08/2004  . GERD (gastroesophageal reflux disease)   . Hair thinning   . HTN (hypertension)   . Hyperplastic polyps of stomach 06/2002  . Hypothyroidism   . ICD (implantable cardiac defibrillator) in place    BiV/ICD; s/p removal of ICD with insertion of BiV PPM 04/26/12  . Insomnia 08/20/2015  . Kyphosis 08/20/2015  . LBBB (left bundle branch block)   . Melanoma ([place])    right arm  . Mitral regurgitation   . Nonischemic cardiomyopathy ([place])    EF initially 20%; Last measurement up to 50% per echo in August of 2012  . Other and unspecified hyperlipidemia   . Other and unspecified hyperlipidemia   . Pacemaker 04/26/2012  . Pain in joint,  lower leg   . Palpitations   . Restless legs syndrome (RLS)   . Sleep apnea   . Synovial cyst of popliteal space   . Unspecified chronic bronchitis   . Unspecified nasal polyp   . Unspecified sinusitis (chronic)     ROS:   All systems reviewed and negative except as noted in the HPI.   Past Surgical History:  Procedure Laterality Date  . ABDOMINAL HYSTERECTOMY  1076   endometriosis  . APPENDECTOMY    . BI-VENTRICULAR PACEMAKER INSERTION N/A 04/26/2012   Procedure: BI-VENTRICULAR PACEMAKER INSERTION (CRT-P);  Surgeon: Evans Lance, MD;  Location: [facility] CATH LAB;  Service: Cardiovascular;  Laterality: N/A;  . defibrillator insertion     s/p removal of previously implanted BiV ICD and insertion of a new BiV pacemaker on 04/26/12  . excise mole of lip  2001   Dr. Larena Sox  . excision of wen  1984   Dr. Parks Ranger  . FEMORAL HERNIA REPAIR  12/25/2012   Dr Dalbert Batman  . FEMORAL HERNIA REPAIR  01/04/2013   Recurrent - Dr. Lilyan Punt  . INCISIONAL HERNIA REPAIR N/A 10/01/2013   Procedure: LAPAROSCOPIC INCISIONAL HERNIA;  Surgeon: Gayland Curry, MD;  Location: [facility];  Service: General;  Laterality: N/A;  . INGUINAL HERNIA REPAIR Right 09/26/2012   Procedure: RIGHT INGUINAL HERNIA REPAIR WITH MESH;  Surgeon: Odis Hollingshead, MD;  Location: [facility];  Service: General;  Laterality: Right;  . INGUINAL HERNIA REPAIR Right 12/25/2012   Procedure: explor right groin, small bowel rescection, tissue repair right femoral hernia;  Surgeon: Adin Hector, MD;  Location: [facility];  Service: General;  Laterality: Right;  . INSERTION OF MESH Right 09/26/2012   Procedure: INSERTION OF MESH;  Surgeon: Odis Hollingshead, MD;  Location: [facility];  Service: General;  Laterality: Right;  . INSERTION OF MESH N/A 10/01/2013   Procedure: INSERTION OF MESH;  Surgeon: Gayland Curry, MD;  Location: [facility];  Service: General;  Laterality: N/A;  . LAPAROSCOPY N/A 01/04/2013   Procedure: Diagnostic Laparoscopy, exploratory  laparotomy with small bowel resection, closure of right femoral hernia repair;  Surgeon: Madilyn Hook, DO;  Location: [facility];  Service: General;  Laterality: N/A;  . Left arm surgery    . RIGHT BREAST LUMPECTOMY  1988   Dr. Marylene Buerger  . right knee surgery     arthroscopy: Dr. Shellia Carwin  . TONSILLECTOMY       Family History  Problem Relation Age of Onset  . Stroke Father   .  Heart disease Other     maternal side  . Colon cancer Paternal Uncle   . Breast cancer Maternal Aunt   . Diabetes Neg Hx      Social History   Social History  . Marital status: Single    Spouse name: N/A  . Number of children: 0  . Years of education: N/A   Occupational History  . Retired   .  Retired   Social History Main Topics  . Smoking status: Former Smoker    Types: Cigarettes    Quit date: 12/22/1993  . Smokeless tobacco: Never Used     Comment: Does'nt reall year quit   . Alcohol use No  . Drug use: No  . Sexual activity: No   Other Topics Concern  . Not on file   Social History Narrative  . No narrative on file     BP (!) 142/70 (BP Location: Right Arm, Patient Position: Sitting, Cuff Size: Normal)   Pulse (!) 55   Ht 5\' 8"  (1.727 m)   Wt 132 lb (59.9 kg)   SpO2 (!) 9%   BMI 20.07 kg/m   Physical Exam:  Well appearing 83 year old woman, NAD HEENT: Unremarkable Neck:  7 cm JVD, no thyromegally Lungs:  Clear with no wheezes, rales, or rhonchi. Well-healed pacemaker incision HEART:  Regular rate rhythm, no murmurs, no rubs, no clicks Abd:  soft, positive bowel sounds, no organomegally, no rebound, no guarding, hernia incisions are healing nicely. Ext:  2 plus pulses, no edema, no cyanosis, no clubbing Skin:  No rashes no nodules Neuro:  CN II through XII intact, motor grossly intact  ECG - normal sinus rhythm with AV sequential biventricular pacing  DEVICE  Normal device function.  See PaceArt for details. RV threshold remains a bit up.  Assess/Plan: 1. Chronic  systolic/diastolic heart failure - her symptoms remain class 2. Her EF remains normalized or nearly so. She will continue her current meds.  2. Palpitations - she has had no atrial fib but does have PAC's and PVC's. She will avoid caffeine. 3. BiV PPM - her [employer] device is working normally. Will recheck in several months. Her RV lead threshold is up a bit. I suspect she will someday need a new RV lead. 4. PAF - she will continue her Xarelto. Her rates are well controlled.  Mikle Bosworth.D.

## 2016-04-14 NOTE — Patient Instructions (Signed)

## 2016-04-15 LAB — CUP PACEART INCLINIC DEVICE CHECK
Brady Statistic RA Percent Paced: 26 %
Brady Statistic RV Percent Paced: 99 %
Date Time Interrogation Session: 20180207050000
Implantable Lead Implant Date: 20070309
Implantable Lead Implant Date: 20070309
Implantable Lead Location: 753859
Implantable Lead Model: 4543
Implantable Lead Serial Number: 121652
Implantable Lead Serial Number: 168416
Lead Channel Impedance Value: 521 Ohm
Lead Channel Pacing Threshold Amplitude: 0.7 V
Lead Channel Pacing Threshold Amplitude: 2.8 V
Lead Channel Pacing Threshold Pulse Width: 1 ms
Lead Channel Sensing Intrinsic Amplitude: 3 mV
Lead Channel Setting Pacing Amplitude: 2 V
Lead Channel Setting Pacing Amplitude: 4 V
Lead Channel Setting Pacing Pulse Width: 1 ms
Lead Channel Setting Pacing Pulse Width: 1 ms
MDC IDC LEAD IMPLANT DT: 20070309
MDC IDC LEAD LOCATION: 753858
MDC IDC LEAD LOCATION: 753860
MDC IDC LEAD SERIAL: 268502
MDC IDC MSMT LEADCHNL LV IMPEDANCE VALUE: 622 Ohm
MDC IDC MSMT LEADCHNL LV PACING THRESHOLD AMPLITUDE: 1.6 V
MDC IDC MSMT LEADCHNL LV SENSING INTR AMPL: 7 mV
MDC IDC MSMT LEADCHNL RA PACING THRESHOLD PULSEWIDTH: 0.4 ms
MDC IDC MSMT LEADCHNL RV IMPEDANCE VALUE: 402 Ohm
MDC IDC MSMT LEADCHNL RV PACING THRESHOLD PULSEWIDTH: 1 ms
MDC IDC MSMT LEADCHNL RV SENSING INTR AMPL: 16.7 mV
MDC IDC PG IMPLANT DT: 20140219
MDC IDC SET LEADCHNL LV PACING AMPLITUDE: 2.5 V
MDC IDC SET LEADCHNL LV SENSING SENSITIVITY: 2.5 mV
MDC IDC SET LEADCHNL RV SENSING SENSITIVITY: 2.5 mV
Pulse Gen Serial Number: 100543

## 2016-04-20 ENCOUNTER — Encounter: Payer: Self-pay | Admitting: Internal Medicine

## 2016-04-20 ENCOUNTER — Ambulatory Visit (INDEPENDENT_AMBULATORY_CARE_PROVIDER_SITE_OTHER): Payer: Medicare Other | Admitting: Internal Medicine

## 2016-04-20 VITALS — BP 148/72 | HR 54 | Temp 97.4°F | Resp 16 | Ht 68.0 in | Wt 133.4 lb

## 2016-04-20 DIAGNOSIS — I5022 Chronic systolic (congestive) heart failure: Secondary | ICD-10-CM | POA: Diagnosis not present

## 2016-04-20 DIAGNOSIS — R059 Cough, unspecified: Secondary | ICD-10-CM

## 2016-04-20 DIAGNOSIS — R05 Cough: Secondary | ICD-10-CM | POA: Diagnosis not present

## 2016-04-20 DIAGNOSIS — K2100 Gastro-esophageal reflux disease with esophagitis, without bleeding: Secondary | ICD-10-CM

## 2016-04-20 DIAGNOSIS — R350 Frequency of micturition: Secondary | ICD-10-CM | POA: Diagnosis not present

## 2016-04-20 DIAGNOSIS — G4700 Insomnia, unspecified: Secondary | ICD-10-CM | POA: Diagnosis not present

## 2016-04-20 DIAGNOSIS — J42 Unspecified chronic bronchitis: Secondary | ICD-10-CM | POA: Diagnosis not present

## 2016-04-20 DIAGNOSIS — E785 Hyperlipidemia, unspecified: Secondary | ICD-10-CM | POA: Diagnosis not present

## 2016-04-20 MED ORDER — AZITHROMYCIN 250 MG PO TABS: ORAL_TABLET | ORAL | 0 refills | Status: DC

## 2016-04-20 NOTE — Progress Notes (Signed)
Facility  [facility]    Place of Service:   OFFICE    Allergies  Allergen Reactions  . Hydrocodone Itching  . Cephalexin Itching and Swelling  . Ciprofloxacin     Not indicated due to aneurysm in aorta    . Doxycycline Other (See Comments)    Per pt: unknown  . Escitalopram     Dry mouth  . Levofloxacin     Not indicated due to aneurysm in aorta   . Moxifloxacin     Not indicated due to aneurysm in aorta   . Ofloxacin Other (See Comments)    Per pt: unknown  . Sertraline     insomnia  . Tape Other (See Comments)    Burns skin.   . Latex Rash    "If pt. Makes contact with or wearing"  . Neomycin Rash    Chief Complaint  Patient presents with  . Medical Management of Chronic Issues    4 month follow-up   . Medication Refill     No refills needed at visit time     HPI:  Persistent rattling cough. Coughing fits in the morning. Has not been using her Mucinex DM.  BP running high.  Says the generic Synthroid made her itch. The itch improved when she resumed Synthroid.  Chronic bronchitis, unspecified chronic bronchitis type ([place]) - bronchial rattle. No fever.  Cough - related to bronchitis  Insomnia, unspecified type - resting poorly. Thinking about trying the Sinequan again  Frequency of urination - urgency without incontinence  Gastroesophageal reflux disease with esophagitis - occasional indigestion  Hyperlipidemia, unspecified hyperlipidemia type - controlled  CHRONIC SYSTOLIC HEART FAILURE - improved. Continues to have some DOE.    Medications: Patient's Medications  New Prescriptions   No medications on file  Previous Medications   ACETAMINOPHEN (TYLENOL) 500 MG TABLET    Take 500 mg by mouth every 6 (six) hours as needed for pain. For pain   ALPRAZOLAM (XANAX) 0.25 MG TABLET    Take One tablet by mouth up to twice daily if needed for anxiety or sleep   ASCORBIC ACID (VITAMIN C) 1000 MG TABLET    Take 1,000 mg by mouth daily.   BIOTIN 5000 MCG TABS     Take 1 tablet by mouth daily.   CALCIUM CARBONATE-VITAMIN D (CALCIUM + D PO)    Take 2 tablets by mouth daily.    CYANOCOBALAMIN (VITAMIN B 12 PO)    Take 1 tablet by mouth daily. Vitamin B12   DOXEPIN (SINEQUAN) 25 MG CAPSULE    One nightly to help nerves and sleep.   ESTRACE VAGINAL 0.1 MG/GM VAGINAL CREAM    Place 1 g vaginally 2 (two) times a week.    FUROSEMIDE (LASIX) 20 MG TABLET    Take 1 tablet (20 mg total) by mouth daily as needed for fluid or edema.   HYDROXYPROPYL METHYLCELLULOSE (ISOPTO TEARS) 2.5 % OPHTHALMIC SOLUTION    Place 1 drop into both eyes 2 (two) times daily as needed for dry eyes.   LEVOTHYROXINE (SYNTHROID, LEVOTHROID) 112 MCG TABLET    TAKE 1 TABLET BY MOUTH EVERY DAY 30 MINUTES BEFORE BREAKFAST FOR THYROID   MAGNESIUM 250 MG TABS    Take 1 tablet by mouth daily.    MULTIPLE VITAMIN (MULTIVITAMIN WITH MINERALS) TABS    Take 1 tablet by mouth daily.   POTASSIUM CHLORIDE SA (K-DUR,KLOR-CON) 20 MEQ TABLET    Take 1 tablet (20 mEq total) by mouth  daily as needed (only with lasix).   RABEPRAZOLE (ACIPHEX) 20 MG TABLET    Take one tablet by mouth once daily for stomach   RIVAROXABAN (XARELTO) 20 MG TABS TABLET    Take 1 tablet (20 mg total) by mouth daily with supper.   TRANDOLAPRIL (MAVIK) 2 MG TABLET    One daily to strengthen the heart  Modified Medications   No medications on file  Discontinued Medications   No medications on file    Review of Systems  Constitutional: Positive for appetite change, fatigue and unexpected weight change. Negative for chills and fever.  HENT: Positive for congestion, hearing loss, postnasal drip, rhinorrhea and sinus pressure. Negative for ear discharge, ear pain, facial swelling, nosebleeds, sneezing and tinnitus.   Eyes: Negative.   Respiratory: Positive for cough and shortness of breath. Negative for wheezing.   Cardiovascular: Positive for palpitations. Negative for chest pain and leg swelling.  Gastrointestinal: Negative for  abdominal distention, abdominal pain, blood in stool, diarrhea and nausea.       Hx erosive gastritis, HH, Barrett's esophagus. Tender RUQ and RLQ.  Endocrine: Negative.   Genitourinary: Positive for frequency and urgency.       Nocturia x 3. Hx of vaginal discomfort. Uses hormone cream from GYN, Dr. Nori Riis  Musculoskeletal: Positive for arthralgias, back pain, neck pain and neck stiffness. Negative for gait problem, joint swelling and myalgias.  Skin: Negative for pallor, rash and wound.       S/P right herniorraphy. Right index finger has onycholysis distally and tenderness.  Neurological: Positive for dizziness, numbness and headaches. Negative for tremors, seizures, syncope and speech difficulty.  Hematological: Negative.   Psychiatric/Behavioral: Positive for dysphoric mood. The patient is nervous/anxious (.cont).     Vitals:   04/20/16 1518  BP: (!) 154/72  Pulse: (!) 54  Resp: 16  Temp: 97.4 F (36.3 C)  TempSrc: Oral  SpO2: 94%  Weight: 133 lb 6.4 oz (60.5 kg)  Height: _0  (1.727 m)   Body mass index is 20.28 kg/m. Wt Readings from Last 3 Encounters:  04/20/16 133 lb 6.4 oz (60.5 kg)  04/14/16 132 lb (59.9 kg)  02/17/16 133 lb (60.3 kg)      Physical Exam  Constitutional:  Frail, thin body. Losing weight.  HENT:  Right Ear: External ear normal.  Left Ear: External ear normal.  Mouth/Throat: Oropharynx is clear and moist.  Nasal polyp  Eyes:  Corrective lensees  Neck: No JVD present. No tracheal deviation present. No thyromegaly present.  stiff  Cardiovascular: Normal rate and regular rhythm.  Exam reveals no gallop and no friction rub.   No murmur heard. AICD in left upper chest. Raynaud's disease.  Pulmonary/Chest: Effort normal. No respiratory distress. She has no wheezes. She has rales. She exhibits no tenderness.  Abdominal: Soft. Bowel sounds are normal. She exhibits no distension and no mass. Tenderness: RLQ.  Right inguinal area has scar from  surgery.   Musculoskeletal: Normal range of motion. She exhibits no edema.  Mild tenderness in low back . Left ulnar bursa enlargement has resolved.  Lymphadenopathy:    She has no cervical adenopathy.  Neurological:  Reduced vibratory sensation in both feet.  Skin: No rash noted. No erythema. No pallor.  Onycholysis of right index fingeer. Tender to palpation.  Psychiatric:  Anxious. Depressed affect. Low energy.    Labs reviewed: Lab Summary Latest Ref Rng & Units 11/03/2015  Hemoglobin 11.7 - 15.5 g/dL 13.4  Hematocrit 35.0 - 45.0 %  40.7  White count 3.8 - 10.8 K/uL 6.9  Platelet count 140 - 400 K/uL 193  Sodium 135 - 146 mmol/L 138  Potassium 3.5 - 5.3 mmol/L 4.8  Calcium 8.6 - 10.4 mg/dL 9.2  Phosphorus - (None)  Creatinine 0.60 - 0.88 mg/dL 0.68  AST - (None)  Alk Phos - (None)  Bilirubin - (None)  Glucose 65 - 99 mg/dL 81  Cholesterol - (None)  HDL cholesterol - (None)  Triglycerides - (None)  LDL Direct - (None)  LDL Calc - (None)  Total protein - (None)  Albumin - (None)  Some recent data might be hidden   Lab Results  Component Value Date   TSH 0.931 08/18/2015   TSH 0.990 05/16/2015   TSH 1.200 10/18/2014   Lab Results  Component Value Date   BUN 17 11/03/2015   BUN 18 08/18/2015   BUN 14 05/16/2015   No results found for: HGBA1C  Assessment/Plan  1. Chronic bronchitis, unspecified chronic bronchitis type ([place]) - azithromycin (ZITHROMAX) 250 MG tablet; 2 initially, then 1 daily for infection  Dispense: 6 tablet; Refill: 0  2. Cough Delsym prn  3. Insomnia, unspecified type -encouraged her to try the Sinequan  4. Frequency of urination -continue hormone cream  5. Gastroesophageal reflux disease with esophagitis -continue Aciphex  6. Hyperlipidemia, unspecified hyperlipidemia type Follow lab   7. CHRONIC SYSTOLIC HEART FAILURE The current medical regimen is effective;  continue present plan and medications.

## 2016-04-26 ENCOUNTER — Other Ambulatory Visit: Payer: Self-pay | Admitting: Internal Medicine

## 2016-04-26 DIAGNOSIS — R350 Frequency of micturition: Secondary | ICD-10-CM | POA: Diagnosis not present

## 2016-04-26 DIAGNOSIS — N390 Urinary tract infection, site not specified: Secondary | ICD-10-CM | POA: Diagnosis not present

## 2016-04-26 DIAGNOSIS — R351 Nocturia: Secondary | ICD-10-CM | POA: Diagnosis not present

## 2016-04-28 DIAGNOSIS — H02844 Edema of left upper eyelid: Secondary | ICD-10-CM | POA: Diagnosis not present

## 2016-04-28 DIAGNOSIS — H02845 Edema of left lower eyelid: Secondary | ICD-10-CM | POA: Diagnosis not present

## 2016-05-11 DIAGNOSIS — N390 Urinary tract infection, site not specified: Secondary | ICD-10-CM | POA: Diagnosis not present

## 2016-05-11 DIAGNOSIS — R351 Nocturia: Secondary | ICD-10-CM | POA: Diagnosis not present

## 2016-05-18 ENCOUNTER — Encounter: Payer: Self-pay | Admitting: Internal Medicine

## 2016-05-18 ENCOUNTER — Ambulatory Visit (INDEPENDENT_AMBULATORY_CARE_PROVIDER_SITE_OTHER): Payer: Medicare Other | Admitting: Internal Medicine

## 2016-05-18 VITALS — BP 142/64 | HR 63 | Temp 97.3°F | Resp 16 | Ht 68.0 in | Wt 131.8 lb

## 2016-05-18 DIAGNOSIS — J449 Chronic obstructive pulmonary disease, unspecified: Secondary | ICD-10-CM

## 2016-05-18 DIAGNOSIS — I429 Cardiomyopathy, unspecified: Secondary | ICD-10-CM

## 2016-05-18 DIAGNOSIS — I5022 Chronic systolic (congestive) heart failure: Secondary | ICD-10-CM | POA: Diagnosis not present

## 2016-05-18 DIAGNOSIS — J42 Unspecified chronic bronchitis: Secondary | ICD-10-CM

## 2016-05-18 DIAGNOSIS — R05 Cough: Secondary | ICD-10-CM

## 2016-05-18 DIAGNOSIS — I420 Dilated cardiomyopathy: Secondary | ICD-10-CM

## 2016-05-18 DIAGNOSIS — I10 Essential (primary) hypertension: Secondary | ICD-10-CM | POA: Diagnosis not present

## 2016-05-18 DIAGNOSIS — R059 Cough, unspecified: Secondary | ICD-10-CM

## 2016-05-18 MED ORDER — AZITHROMYCIN 250 MG PO TABS: ORAL_TABLET | ORAL | 0 refills | Status: DC

## 2016-05-18 MED ORDER — TRANDOLAPRIL 2 MG PO TABS: ORAL_TABLET | ORAL | 3 refills | Status: DC

## 2016-05-18 NOTE — Progress Notes (Signed)
Facility  [facility]    Place of Service:   OFFICE    Allergies  Allergen Reactions  . Hydrocodone Itching  . Cephalexin Itching and Swelling  . Ciprofloxacin     Not indicated due to aneurysm in aorta    . Doxycycline Other (See Comments)    Per pt: unknown  . Escitalopram     Dry mouth  . Levofloxacin     Not indicated due to aneurysm in aorta   . Moxifloxacin     Not indicated due to aneurysm in aorta   . Ofloxacin Other (See Comments)    Per pt: unknown  . Sertraline     insomnia  . Tape Other (See Comments)    Burns skin.   . Latex Rash    "If pt. Makes contact with or wearing"  . Neomycin Rash    Chief Complaint  Patient presents with  . Medical Management of Chronic Issues    4 week follow-up on Bronchitis, and insomnia.  . Medication Refill    No refills needed at this time     HPI:  Last seen 04/20/16. Given azithromycin for chronic bronchitis. Recommended Delsym for the cough. Cough is a little better. She wants too try another antibiotic before she considers going to pulmonary specialist.  Insomnia-had recommended Sinequan, but she has not tried it.  BP has been a little high on the SBP.  Urine frequency - on hormone, Estrace, cream. Saw Dr. Matilde Sprang. She does not feel he was able to help.  Has appt with eye doctor 05/20/16. Splashed Boost and thinks the cap hit her eye, then had a bruise over the left eye. Following that, she did not see well on 05/11/16 through 05/12/16. Vision cleared after that, but she worried the Xarelto could have caused a problem with hemorrhage in her eyes. Was seeing halos around car lights  Medications: Patient's Medications  New Prescriptions   No medications on file  Previous Medications   ACETAMINOPHEN (TYLENOL) 500 MG TABLET    Take 500 mg by mouth every 6 (six) hours as needed for pain. For pain   ALPRAZOLAM (XANAX) 0.25 MG TABLET    Take One tablet by mouth up to twice daily if needed for anxiety or sleep   ASCORBIC ACID  (VITAMIN C) 1000 MG TABLET    Take 1,000 mg by mouth daily.   BIOTIN 5000 MCG TABS    Take 1 tablet by mouth daily.   CALCIUM CARBONATE-VITAMIN D (CALCIUM + D PO)    Take 2 tablets by mouth daily.    CYANOCOBALAMIN (VITAMIN B 12 PO)    Take 1 tablet by mouth daily. Vitamin B12   DOXEPIN (SINEQUAN) 25 MG CAPSULE    One nightly to help nerves and sleep.   ESTRACE VAGINAL 0.1 MG/GM VAGINAL CREAM    Place 1 g vaginally 2 (two) times a week.    FUROSEMIDE (LASIX) 20 MG TABLET    Take 1 tablet (20 mg total) by mouth daily as needed for fluid or edema.   HYDROXYPROPYL METHYLCELLULOSE (ISOPTO TEARS) 2.5 % OPHTHALMIC SOLUTION    Place 1 drop into both eyes 2 (two) times daily as needed for dry eyes.   LEVOTHYROXINE (SYNTHROID, LEVOTHROID) 112 MCG TABLET    TAKE 1 TABLET BY MOUTH EVERY DAY 30 MINUTES BEFORE BREAKFAST FOR THYROID   MAGNESIUM 250 MG TABS    Take 1 tablet by mouth daily.    MULTIPLE VITAMIN (MULTIVITAMIN WITH MINERALS) TABS  Take 1 tablet by mouth daily.   POTASSIUM CHLORIDE SA (K-DUR,KLOR-CON) 20 MEQ TABLET    Take 1 tablet (20 mEq total) by mouth daily as needed (only with lasix).   RABEPRAZOLE (ACIPHEX) 20 MG TABLET    TAKE 1 TABLET ONCE DAILY   FOR STOMACH *LUPIN MFR*   RIVAROXABAN (XARELTO) 20 MG TABS TABLET    Take 1 tablet (20 mg total) by mouth daily with supper.   TRANDOLAPRIL (MAVIK) 2 MG TABLET    One daily to strengthen the heart  Modified Medications   No medications on file  Discontinued Medications   AZITHROMYCIN (ZITHROMAX) 250 MG TABLET    2 initially, then 1 daily for infection    Review of Systems  Constitutional: Positive for appetite change, fatigue and unexpected weight change. Negative for chills and fever.  HENT: Positive for congestion, hearing loss, postnasal drip, rhinorrhea and sinus pressure. Negative for ear discharge, ear pain, facial swelling, nosebleeds, sneezing and tinnitus.   Eyes: Negative.   Respiratory: Positive for cough and shortness of breath.  Negative for wheezing.   Cardiovascular: Positive for palpitations. Negative for chest pain and leg swelling.  Gastrointestinal: Negative for abdominal distention, abdominal pain, blood in stool, diarrhea and nausea.       Hx erosive gastritis, HH, Barrett's esophagus. Tender RUQ and RLQ.  Endocrine: Negative.   Genitourinary: Positive for frequency and urgency.       Nocturia x 3. Hx of vaginal discomfort. Uses hormone cream from GYN, Dr. Nori Riis  Musculoskeletal: Positive for arthralgias, back pain, neck pain and neck stiffness. Negative for gait problem, joint swelling and myalgias.  Skin: Negative for pallor, rash and wound.       S/P right herniorraphy. Right index finger has onycholysis distally and tenderness.  Neurological: Positive for dizziness, numbness and headaches. Negative for tremors, seizures, syncope and speech difficulty.  Hematological: Negative.   Psychiatric/Behavioral: Positive for dysphoric mood. The patient is nervous/anxious (.cont).     Vitals:   05/18/16 1442  BP: (!) 142/64  Pulse: 63  Resp: 16  Temp: 97.3 F (36.3 C)  TempSrc: Oral  SpO2: 98%  Weight: 131 lb 12.8 oz (59.8 kg)  Height: 5\' 8"  (1.727 m)   Body mass index is 20.04 kg/m. Wt Readings from Last 3 Encounters:  05/18/16 131 lb 12.8 oz (59.8 kg)  04/20/16 133 lb 6.4 oz (60.5 kg)  04/14/16 132 lb (59.9 kg)      Physical Exam  Constitutional:  Frail, thin body. Losing weight.  HENT:  Right Ear: External ear normal.  Left Ear: External ear normal.  Mouth/Throat: Oropharynx is clear and moist.  Nasal polyp  Eyes:  Corrective lensees  Neck: No JVD present. No tracheal deviation present. No thyromegaly present.  stiff  Cardiovascular: Normal rate and regular rhythm.  Exam reveals no gallop and no friction rub.   No murmur heard. AICD in left upper chest. Raynaud's disease.  Pulmonary/Chest: Effort normal. No respiratory distress. She has no wheezes. She has rales. She exhibits no  tenderness.  Abdominal: Soft. Bowel sounds are normal. She exhibits no distension and no mass. Tenderness: RLQ.  Right inguinal area has scar from surgery.   Musculoskeletal: Normal range of motion. She exhibits no edema.  Mild tenderness in low back . Left ulnar bursa enlargement has resolved.  Lymphadenopathy:    She has no cervical adenopathy.  Neurological:  Reduced vibratory sensation in both feet.  Skin: No rash noted. No erythema. No pallor.  Onycholysis of  right index fingeer. Tender to palpation.  Psychiatric:  Anxious. Depressed affect. Low energy.    Labs reviewed: No flowsheet data found. Lab Results  Component Value Date   TSH 0.931 08/18/2015   TSH 0.990 05/16/2015   TSH 1.200 10/18/2014   Lab Results  Component Value Date   BUN 17 11/03/2015   BUN 18 08/18/2015   BUN 14 05/16/2015   No results found for: HGBA1C  Assessment/Plan  1. Essential hypertension SBP is running high. - trandolapril (MAVIK) 2 MG tablet; One twice daily to strengthen the heart and control BP  Dispense: 180 tablet; Refill: 3  2. Cardiomyopathy, dilated, nonischemic ([facility]) - trandolapril (MAVIK) 2 MG tablet; One twice daily to strengthen the heart and control BP  Dispense: 180 tablet; Refill: 3  3. Chronic obstructive pulmonary disease, unspecified COPD type ([place]) Try another course of antibiotic If she is not better, then I think she should see pulmonary - azithromycin (ZITHROMAX) 250 MG tablet; 2 initially, then 1 daily for infection  Dispense: 6 tablet; Refill: 0  4. Chronic bronchitis, unspecified chronic bronchitis type ([place])  5. Cough Continue Delsym prn  6. Chronic systolic heart failure (HCC) -compensated.

## 2016-05-20 DIAGNOSIS — H52223 Regular astigmatism, bilateral: Secondary | ICD-10-CM | POA: Diagnosis not present

## 2016-05-20 DIAGNOSIS — H04123 Dry eye syndrome of bilateral lacrimal glands: Secondary | ICD-10-CM | POA: Diagnosis not present

## 2016-05-20 DIAGNOSIS — H524 Presbyopia: Secondary | ICD-10-CM | POA: Diagnosis not present

## 2016-05-20 DIAGNOSIS — D4981 Neoplasm of unspecified behavior of retina and choroid: Secondary | ICD-10-CM | POA: Diagnosis not present

## 2016-05-20 DIAGNOSIS — H26492 Other secondary cataract, left eye: Secondary | ICD-10-CM | POA: Diagnosis not present

## 2016-05-25 ENCOUNTER — Telehealth: Payer: Self-pay | Admitting: Cardiology

## 2016-05-25 DIAGNOSIS — I10 Essential (primary) hypertension: Secondary | ICD-10-CM

## 2016-05-25 DIAGNOSIS — I420 Dilated cardiomyopathy: Secondary | ICD-10-CM

## 2016-05-25 MED ORDER — TRANDOLAPRIL 2 MG PO TABS: ORAL_TABLET | ORAL | 3 refills | Status: DC

## 2016-05-25 NOTE — Telephone Encounter (Signed)
Returned call to patient.She stated she had 2 questions to ask Dr.Jordan #1 B/P has been elevated ranging 140 to 150 /80's.PCP Dr.Greene advised to increase Mavik 2 mg twice a day.She wanted to know if Dr.Jordan agreed.#2 Is it ok to have laser eye surgery to remove a film over eye with her pacemaker.

## 2016-05-25 NOTE — Telephone Encounter (Signed)
Please call,she have some questions she needs you to ask Dr [name].

## 2016-05-25 NOTE — Telephone Encounter (Signed)
Yes she is OK for eye procedure. Yes she should increase Mavik as outlined  Peter [name] MD, [facility]

## 2016-05-25 NOTE — Telephone Encounter (Signed)
Spoke to patient Dr.Jordan's advice given. 

## 2016-05-25 NOTE — Telephone Encounter (Signed)
Message sent to Dr.Jordan for advice. 

## 2016-06-18 ENCOUNTER — Encounter: Payer: Self-pay | Admitting: Cardiology

## 2016-06-29 DIAGNOSIS — J300 Vasomotor rhinitis: Secondary | ICD-10-CM | POA: Diagnosis not present

## 2016-06-29 DIAGNOSIS — R040 Epistaxis: Secondary | ICD-10-CM | POA: Diagnosis not present

## 2016-06-30 ENCOUNTER — Other Ambulatory Visit: Payer: Self-pay | Admitting: Internal Medicine

## 2016-06-30 DIAGNOSIS — G4700 Insomnia, unspecified: Secondary | ICD-10-CM

## 2016-06-30 DIAGNOSIS — F32A Depression, unspecified: Secondary | ICD-10-CM

## 2016-06-30 DIAGNOSIS — F329 Major depressive disorder, single episode, unspecified: Secondary | ICD-10-CM

## 2016-07-01 ENCOUNTER — Telehealth: Payer: Self-pay

## 2016-07-01 NOTE — Telephone Encounter (Signed)
Pharmacist called to confirm that Dr.Green would like Doxepin filled, This medication is on the [facility] list, patient will be at risk for hypotension, falls, and sedation.  Please advise if ok to fill

## 2016-07-01 NOTE — Telephone Encounter (Signed)
I understand and would still like the prescription filled.

## 2016-07-01 NOTE — Telephone Encounter (Signed)
Spoke with Malia at the pharmacy and informed her of Dr.Green's response.

## 2016-07-04 NOTE — Progress Notes (Signed)
[name] Date of Birth: December 15, 1932 Medical Record #[id number]  History of Present Illness: [name] is seen today for follow up. She has multiple medical issues which include a cardiomyopathy, underlying BiV/ICD which resulted in improvement of her EF, LBBB, HTN, thyroid disease, melanoma, anxiety/depression and chronic systolic HF. EF was 55% by last Echo in 2/16 .   She had a difficult time with hernia surgery in 2014. She had a fourth surgery in July 2015 for correction of a ventral hernia. Following one of the surgeries she had a wide complex arrhythmia that was felt by Dr. Rayann Heman to be her intrinsic rhythm (over her Biv paced rhythm). This was felt to be related to increased adrenergic tone. She was managed with beta blocker and digoxin. Digoxin was later discontinued.   In September she was noted in device clinic to have paroxysmal Afib with controlled rate. She was started on Eliquis. [place] Vasc score of at least 4. She developed itching on Eliquis and switched to Xarelto. Itching did not go away so she was switched to Coumadin. After about a month the itching resolved.    She also was seen by vascular surgery last year. Abd US showed a 2.7 cm AAA and 1.7 cm left iliac aneurysm. Follow up in one year. She was seen for back pain by Dr. Louanne Skye. PT recommended but she hasn't started yet.   Since her last visit she was switched from coumadin to Xarelto. Follow up in device clinic in February showed no Afib but PACs and PVCs.  On follow up today she is doing "terrible". Feels like she is falling apart. Notes BP was running high and Mavik increased to 2 mg bid. Since then BP has been well controlled. Continues to have back pain but more concerned about the fact that her posture is stooped.  No chest pain or SOB. No palpitations. No edema. Continues to be depressed. Upset that Dr. Nyoka Cowden is retiring.     Current Outpatient Prescriptions  Medication Sig Dispense Refill  . acetaminophen  (TYLENOL) 500 MG tablet Take 500 mg by mouth every 6 (six) hours as needed for pain. For pain    . ALPRAZolam (XANAX) 0.25 MG tablet Take One tablet by mouth up to twice daily if needed for anxiety or sleep 60 tablet 3  . Ascorbic Acid (VITAMIN C) 1000 MG tablet Take 1,000 mg by mouth daily.    Marland Kitchen azithromycin (ZITHROMAX) 250 MG tablet 2 initially, then 1 daily for infection 6 tablet 0  . Biotin 5000 MCG TABS Take 1 tablet by mouth daily.    . Calcium Carbonate-Vitamin D (CALCIUM + D PO) Take 2 tablets by mouth daily.     . Cyanocobalamin (VITAMIN B 12 PO) Take 1 tablet by mouth daily. Vitamin B12    . doxepin (SINEQUAN) 25 MG capsule TAKE 1 CAPSULE BY MOUTH NIGHTLY TO HELP NERVES AND SLEEP 60 capsule 3  . ESTRACE VAGINAL 0.1 MG/GM vaginal cream Place 1 g vaginally 2 (two) times a week.     . furosemide (LASIX) 20 MG tablet Take 1 tablet (20 mg total) by mouth daily as needed for fluid or edema. 30 tablet 6  . hydroxypropyl methylcellulose (ISOPTO TEARS) 2.5 % ophthalmic solution Place 1 drop into both eyes 2 (two) times daily as needed for dry eyes.    Marland Kitchen levothyroxine (SYNTHROID, LEVOTHROID) 112 MCG tablet TAKE 1 TABLET BY MOUTH EVERY DAY 30 MINUTES BEFORE BREAKFAST FOR THYROID 90 tablet 1  . Magnesium  250 MG TABS Take 1 tablet by mouth daily.     . Multiple Vitamin (MULTIVITAMIN WITH MINERALS) TABS Take 1 tablet by mouth daily.    . potassium chloride SA (K-DUR,KLOR-CON) 20 MEQ tablet Take 1 tablet (20 mEq total) by mouth daily as needed (only with lasix). 30 tablet 6  . RABEprazole (ACIPHEX) 20 MG tablet TAKE 1 TABLET ONCE DAILY   FOR STOMACH *LUPIN MFR* 90 tablet 1  . rivaroxaban (XARELTO) 20 MG TABS tablet Take 1 tablet (20 mg total) by mouth daily with supper. 90 tablet 1  . trandolapril (MAVIK) 2 MG tablet Take 2 mg twice a day 180 tablet 3   No current facility-administered medications for this visit.     Allergies  Allergen Reactions  . Hydrocodone Itching  . Cephalexin Itching and  Swelling  . Ciprofloxacin     Not indicated due to aneurysm in aorta    . Doxycycline Other (See Comments)    Per pt: unknown  . Escitalopram     Dry mouth  . Levofloxacin     Not indicated due to aneurysm in aorta   . Moxifloxacin     Not indicated due to aneurysm in aorta   . Ofloxacin Other (See Comments)    Per pt: unknown  . Sertraline     insomnia  . Tape Other (See Comments)    Burns skin.   . Latex Rash    "If pt. Makes contact with or wearing"  . Neomycin Rash    Past Medical History:  Diagnosis Date  . Anxiety disorder   . Barrett's esophagus 04/2008  . Benign neoplasm of colon   . CHF (congestive heart failure) ([place])   . Chronic airway obstruction, not elsewhere classified   . Complication of anesthesia    slow to wake up  . Depressive disorder, not elsewhere classified   . Diverticulitis   . Dysthymic disorder   . Erosive gastritis 08/2004  . GERD (gastroesophageal reflux disease)   . Hair thinning   . HTN (hypertension)   . Hyperplastic polyps of stomach 06/2002  . Hypothyroidism   . ICD (implantable cardiac defibrillator) in place    BiV/ICD; s/p removal of ICD with insertion of BiV PPM 04/26/12  . Insomnia 08/20/2015  . Kyphosis 08/20/2015  . LBBB (left bundle branch block)   . Melanoma ([place])    right arm  . Mitral regurgitation   . Nonischemic cardiomyopathy ([place])    EF initially 20%; Last measurement up to 50% per echo in August of 2012  . Other and unspecified hyperlipidemia   . Other and unspecified hyperlipidemia   . Pacemaker 04/26/2012  . Pain in joint, lower leg   . Palpitations   . Restless legs syndrome (RLS)   . Sleep apnea   . Synovial cyst of popliteal space   . Unspecified chronic bronchitis ([place])   . Unspecified nasal polyp   . Unspecified sinusitis (chronic)     Past Surgical History:  Procedure Laterality Date  . ABDOMINAL HYSTERECTOMY  1076   endometriosis  . APPENDECTOMY    . BI-VENTRICULAR PACEMAKER INSERTION N/A  04/26/2012   Procedure: BI-VENTRICULAR PACEMAKER INSERTION (CRT-P);  Surgeon: Evans Lance, MD;  Location: [facility] CATH [facility];  Service: Cardiovascular;  Laterality: N/A;  . defibrillator insertion     s/p removal of previously implanted BiV ICD and insertion of a new BiV pacemaker on 04/26/12  . excise mole of lip  2001   Dr. Larena Sox  .  excision of wen  1984   Dr. Parks Ranger  . FEMORAL HERNIA REPAIR  12/25/2012   Dr Dalbert Batman  . FEMORAL HERNIA REPAIR  01/04/2013   Recurrent - Dr. Lilyan Punt  . INCISIONAL HERNIA REPAIR N/A 10/01/2013   Procedure: LAPAROSCOPIC INCISIONAL HERNIA;  Surgeon: Gayland Curry, MD;  Location: [facility];  Service: General;  Laterality: N/A;  . INGUINAL HERNIA REPAIR Right 09/26/2012   Procedure: RIGHT INGUINAL HERNIA REPAIR WITH MESH;  Surgeon: Odis Hollingshead, MD;  Location: [place];  Service: General;  Laterality: Right;  . INGUINAL HERNIA REPAIR Right 12/25/2012   Procedure: explor right groin, small bowel rescection, tissue repair right femoral hernia;  Surgeon: Adin Hector, MD;  Location: [facility];  Service: General;  Laterality: Right;  . INSERTION OF MESH Right 09/26/2012   Procedure: INSERTION OF MESH;  Surgeon: Odis Hollingshead, MD;  Location: [place];  Service: General;  Laterality: Right;  . INSERTION OF MESH N/A 10/01/2013   Procedure: INSERTION OF MESH;  Surgeon: Gayland Curry, MD;  Location: [facility];  Service: General;  Laterality: N/A;  . LAPAROSCOPY N/A 01/04/2013   Procedure: Diagnostic Laparoscopy, exploratory laparotomy with small bowel resection, closure of right femoral hernia repair;  Surgeon: Madilyn Hook, DO;  Location: [facility];  Service: General;  Laterality: N/A;  . Left arm surgery    . RIGHT BREAST LUMPECTOMY  1988   Dr. Marylene Buerger  . right knee surgery     arthroscopy: Dr. Shellia Carwin  . TONSILLECTOMY      History  Smoking Status  . Former Smoker  . Types: Cigarettes  . Quit date: 12/22/1993  Smokeless Tobacco  . Never Used    Comment: Does'nt  reall year quit     History  Alcohol Use No    Family History  Problem Relation Age of Onset  . Stroke Father   . Heart disease Other     maternal side  . Colon cancer Paternal Uncle   . Breast cancer Maternal Aunt   . Diabetes Neg Hx     Review of Systems: The review of systems is per the HPI.   All other systems were reviewed and are negative.  Physical Exam: BP 108/60   Pulse (!) 55   Ht 5' 8.5" (1.74 m)   Wt 132 lb (59.9 kg)   SpO2 100%   BMI 19.78 kg/m  Patient is very pleasant and in no acute distress.Skin is warm and dry. Color is normal.  HEENT is unremarkable. Normocephalic/atraumatic. PERRL. Sclera are nonicteric. Neck is supple. No masses. No JVD. Lungs are clear. Cardiac exam shows a regular rate and rhythm. Normal S1-2. No gallop or murmur. Abdomen is soft. Extremities are without edema. Gait and ROM are intact. No gross neurologic deficits noted. Mood is anxious and tearful.  Wt Readings from Last 3 Encounters:  07/07/16 132 lb (59.9 kg)  05/18/16 131 lb 12.8 oz (59.8 kg)  04/20/16 133 lb 6.4 oz (60.5 kg)     LABORATORY DATA: Lab Results  Component Value Date   WBC 6.9 11/03/2015   HGB 13.4 11/03/2015   HCT 40.7 11/03/2015   PLT 193 11/03/2015   GLUCOSE 81 11/03/2015   CHOL 168 08/18/2015   TRIG 92 08/18/2015   HDL 71 08/18/2015   LDLCALC 79 08/18/2015   ALT 22 08/18/2015   AST 30 08/18/2015   NA 138 11/03/2015   K 4.8 11/03/2015   CL 104 11/03/2015   CREATININE 0.68 11/03/2015  BUN 17 11/03/2015   CO2 28 11/03/2015   TSH 0.931 08/18/2015   INR 2.1 01/26/2016    Last ICD check in Sept showed biv pacing >99% of the time. 2.9 hours of Afib.  Echo: 07/17/15:  Study Conclusions  - Left ventricle: The cavity size was normal. Systolic function was   normal. Wall motion was normal; there were no regional wall   motion abnormalities. - Aortic valve: There was trivial regurgitation. - Mitral valve: There was mild regurgitation. - Left  atrium: The atrium was mildly dilated.    Assessment / Plan: 1.  History of dialted Cardiomyopathy with chronic systolic CHF.  EF last normal 55%- has BiV/ICD in place -  I think overall she is doing great.  Continue low dose ACEi. We stopped metoprolol due to Raynauds symptoms. Will update Echo again.  2. NSVT. Follow up ICD/biV in device clinic.   3. Paroxysmal Afib. [place] Vasc score of at least 4. On Xarelto. Tolerating well.   4. Small AAA and left iliac aneurysm. Follow up with [facility].  Will check labs including fasting lipids, chemistries, CBC, TSH.   I will follow up in 6 months.

## 2016-07-07 ENCOUNTER — Ambulatory Visit (INDEPENDENT_AMBULATORY_CARE_PROVIDER_SITE_OTHER): Payer: Medicare Other | Admitting: Cardiology

## 2016-07-07 ENCOUNTER — Encounter: Payer: Self-pay | Admitting: Cardiology

## 2016-07-07 VITALS — BP 108/60 | HR 55 | Ht 68.5 in | Wt 132.0 lb

## 2016-07-07 DIAGNOSIS — I10 Essential (primary) hypertension: Secondary | ICD-10-CM

## 2016-07-07 DIAGNOSIS — I5022 Chronic systolic (congestive) heart failure: Secondary | ICD-10-CM

## 2016-07-07 DIAGNOSIS — I4891 Unspecified atrial fibrillation: Secondary | ICD-10-CM

## 2016-07-07 DIAGNOSIS — I447 Left bundle-branch block, unspecified: Secondary | ICD-10-CM

## 2016-07-07 NOTE — Patient Instructions (Signed)
Continue your current therapy  We will schedule you for an Echocardiogram   I will see you in 6 months   

## 2016-07-08 DIAGNOSIS — I10 Essential (primary) hypertension: Secondary | ICD-10-CM | POA: Diagnosis not present

## 2016-07-08 DIAGNOSIS — I5022 Chronic systolic (congestive) heart failure: Secondary | ICD-10-CM | POA: Diagnosis not present

## 2016-07-08 DIAGNOSIS — I447 Left bundle-branch block, unspecified: Secondary | ICD-10-CM | POA: Diagnosis not present

## 2016-07-08 DIAGNOSIS — I4891 Unspecified atrial fibrillation: Secondary | ICD-10-CM | POA: Diagnosis not present

## 2016-07-08 LAB — CBC WITH DIFFERENTIAL/PLATELET
BASOS PCT: 0 %
Basophils Absolute: 0 cells/uL (ref 0–200)
Eosinophils Absolute: 315 cells/uL (ref 15–500)
Eosinophils Relative: 5 %
HCT: 38.9 % (ref 35.0–45.0)
Hemoglobin: 13.1 g/dL (ref 11.7–15.5)
LYMPHS PCT: 24 %
Lymphs Abs: 1512 cells/uL (ref 850–3900)
MCH: 29.6 pg (ref 27.0–33.0)
MCHC: 33.7 g/dL (ref 32.0–36.0)
MCV: 88 fL (ref 80.0–100.0)
MONOS PCT: 10 %
MPV: 9.5 fL (ref 7.5–12.5)
Monocytes Absolute: 630 cells/uL (ref 200–950)
Neutro Abs: 3843 cells/uL (ref 1500–7800)
Neutrophils Relative %: 61 %
PLATELETS: 206 10*3/uL (ref 140–400)
RBC: 4.42 MIL/uL (ref 3.80–5.10)
RDW: 14.3 % (ref 11.0–15.0)
WBC: 6.3 10*3/uL (ref 3.8–10.8)

## 2016-07-08 LAB — COMPREHENSIVE METABOLIC PANEL
ALK PHOS: 80 U/L (ref 33–130)
ALT: 12 U/L (ref 6–29)
AST: 20 U/L (ref 10–35)
Albumin: 3.9 g/dL (ref 3.6–5.1)
BUN: 19 mg/dL (ref 7–25)
CO2: 26 mmol/L (ref 20–31)
Calcium: 9.4 mg/dL (ref 8.6–10.4)
Chloride: 107 mmol/L (ref 98–110)
Creat: 0.83 mg/dL (ref 0.60–0.88)
GLUCOSE: 77 mg/dL (ref 65–99)
POTASSIUM: 4.6 mmol/L (ref 3.5–5.3)
Sodium: 141 mmol/L (ref 135–146)
Total Bilirubin: 0.3 mg/dL (ref 0.2–1.2)
Total Protein: 6.5 g/dL (ref 6.1–8.1)

## 2016-07-08 LAB — TSH: TSH: 0.46 m[IU]/L

## 2016-07-08 LAB — LIPID PANEL
CHOL/HDL RATIO: 2.5 ratio (ref <5.0)
Cholesterol: 162 mg/dL (ref <200)
HDL: 65 mg/dL (ref >50)
LDL CALC: 82 mg/dL (ref <100)
Triglycerides: 75 mg/dL (ref <150)
VLDL: 15 mg/dL (ref <30)

## 2016-07-09 ENCOUNTER — Telehealth: Payer: Self-pay | Admitting: Cardiology

## 2016-07-09 DIAGNOSIS — H26492 Other secondary cataract, left eye: Secondary | ICD-10-CM | POA: Diagnosis not present

## 2016-07-09 DIAGNOSIS — H04123 Dry eye syndrome of bilateral lacrimal glands: Secondary | ICD-10-CM | POA: Diagnosis not present

## 2016-07-09 DIAGNOSIS — D4981 Neoplasm of unspecified behavior of retina and choroid: Secondary | ICD-10-CM | POA: Diagnosis not present

## 2016-07-09 DIAGNOSIS — H524 Presbyopia: Secondary | ICD-10-CM | POA: Diagnosis not present

## 2016-07-09 NOTE — Telephone Encounter (Signed)
Returned call to patient.Lab results given.Copy of labs mailed to patient.

## 2016-07-09 NOTE — Telephone Encounter (Signed)
Please call,calling back she have some questions.

## 2016-07-12 ENCOUNTER — Ambulatory Visit (INDEPENDENT_AMBULATORY_CARE_PROVIDER_SITE_OTHER): Payer: Medicare Other | Admitting: *Deleted

## 2016-07-12 DIAGNOSIS — I420 Dilated cardiomyopathy: Secondary | ICD-10-CM

## 2016-07-12 LAB — CUP PACEART INCLINIC DEVICE CHECK
Date Time Interrogation Session: 20180507040000
Implantable Lead Implant Date: 20070309
Implantable Lead Location: 753860
Implantable Lead Model: 158
Implantable Lead Model: 4087
Implantable Lead Model: 4543
Implantable Lead Serial Number: 168416
Lead Channel Impedance Value: 384 Ohm
Lead Channel Impedance Value: 633 Ohm
Lead Channel Pacing Threshold Amplitude: 1.5 V
Lead Channel Pacing Threshold Amplitude: 2.8 V
Lead Channel Pacing Threshold Pulse Width: 0.4 ms
Lead Channel Pacing Threshold Pulse Width: 1 ms
Lead Channel Pacing Threshold Pulse Width: 1 ms
Lead Channel Sensing Intrinsic Amplitude: 12.6 mV
Lead Channel Setting Pacing Amplitude: 2.5 V
Lead Channel Setting Pacing Pulse Width: 1 ms
Lead Channel Setting Sensing Sensitivity: 2.5 mV
Lead Channel Setting Sensing Sensitivity: 2.5 mV
MDC IDC LEAD IMPLANT DT: 20070309
MDC IDC LEAD IMPLANT DT: 20070309
MDC IDC LEAD LOCATION: 753858
MDC IDC LEAD LOCATION: 753859
MDC IDC LEAD SERIAL: 121652
MDC IDC LEAD SERIAL: 268502
MDC IDC MSMT LEADCHNL LV SENSING INTR AMPL: 6.9 mV
MDC IDC MSMT LEADCHNL RA IMPEDANCE VALUE: 534 Ohm
MDC IDC MSMT LEADCHNL RA PACING THRESHOLD AMPLITUDE: 0.8 V
MDC IDC MSMT LEADCHNL RA SENSING INTR AMPL: 2.2 mV
MDC IDC PG IMPLANT DT: 20140219
MDC IDC PG SERIAL: 100543
MDC IDC SET LEADCHNL LV PACING PULSEWIDTH: 1 ms
MDC IDC SET LEADCHNL RA PACING AMPLITUDE: 2 V
MDC IDC SET LEADCHNL RV PACING AMPLITUDE: 4 V

## 2016-07-12 NOTE — Progress Notes (Signed)
Device check in clinic by industry. No changes made this session. See scanned report for details. ROV with Device Clinic 10/13/16 4:00.

## 2016-07-13 ENCOUNTER — Telehealth: Payer: Self-pay | Admitting: Cardiology

## 2016-07-13 NOTE — Telephone Encounter (Signed)
New message   Pt verbalized that she is returning call for rna dn that she is calling to get an   update about her lab results she said that she did receive results in the mail.

## 2016-07-13 NOTE — Telephone Encounter (Signed)
Duplicate message; see result note.  

## 2016-07-13 NOTE — Telephone Encounter (Signed)
Spoke with pt-she will cal PCP do discuss TSH issues

## 2016-07-16 ENCOUNTER — Telehealth: Payer: Self-pay | Admitting: Internal Medicine

## 2016-07-16 NOTE — Telephone Encounter (Signed)
2nd attempt to schedule the patient for an AWV-I but she declined. VDM (DD)

## 2016-07-19 DIAGNOSIS — Z01419 Encounter for gynecological examination (general) (routine) without abnormal findings: Secondary | ICD-10-CM | POA: Diagnosis not present

## 2016-07-19 DIAGNOSIS — Z1272 Encounter for screening for malignant neoplasm of vagina: Secondary | ICD-10-CM | POA: Diagnosis not present

## 2016-07-19 DIAGNOSIS — Z6820 Body mass index (BMI) 20.0-20.9, adult: Secondary | ICD-10-CM | POA: Diagnosis not present

## 2016-07-22 ENCOUNTER — Other Ambulatory Visit: Payer: Self-pay

## 2016-07-22 ENCOUNTER — Ambulatory Visit (HOSPITAL_COMMUNITY): Payer: Medicare Other | Attending: Cardiovascular Disease

## 2016-07-22 DIAGNOSIS — I10 Essential (primary) hypertension: Secondary | ICD-10-CM | POA: Diagnosis not present

## 2016-07-22 DIAGNOSIS — I080 Rheumatic disorders of both mitral and aortic valves: Secondary | ICD-10-CM | POA: Insufficient documentation

## 2016-07-22 DIAGNOSIS — I5022 Chronic systolic (congestive) heart failure: Secondary | ICD-10-CM

## 2016-07-22 DIAGNOSIS — I4891 Unspecified atrial fibrillation: Secondary | ICD-10-CM

## 2016-07-22 DIAGNOSIS — I447 Left bundle-branch block, unspecified: Secondary | ICD-10-CM | POA: Insufficient documentation

## 2016-07-26 ENCOUNTER — Other Ambulatory Visit: Payer: Self-pay | Admitting: Cardiology

## 2016-07-26 NOTE — Telephone Encounter (Signed)
Rx request sent to pharmacy.  

## 2016-08-03 ENCOUNTER — Other Ambulatory Visit: Payer: Self-pay | Admitting: Internal Medicine

## 2016-08-11 ENCOUNTER — Other Ambulatory Visit: Payer: Self-pay

## 2016-08-11 ENCOUNTER — Telehealth: Payer: Self-pay

## 2016-08-11 DIAGNOSIS — J449 Chronic obstructive pulmonary disease, unspecified: Secondary | ICD-10-CM

## 2016-08-11 MED ORDER — LEVOTHYROXINE SODIUM 112 MCG PO TABS: ORAL_TABLET | ORAL | 0 refills | Status: DC

## 2016-08-11 NOTE — Telephone Encounter (Signed)
Patient called stating that Dr.Green has recommended a lung specialist on several occasions and in the past she has refused. Patient would like to now consider referral.  Referral order pending please complete if in agreement with request

## 2016-08-13 DIAGNOSIS — Z85820 Personal history of malignant melanoma of skin: Secondary | ICD-10-CM | POA: Diagnosis not present

## 2016-08-13 DIAGNOSIS — L578 Other skin changes due to chronic exposure to nonionizing radiation: Secondary | ICD-10-CM | POA: Diagnosis not present

## 2016-08-19 ENCOUNTER — Ambulatory Visit: Payer: Medicare Other | Admitting: Internal Medicine

## 2016-08-19 NOTE — Telephone Encounter (Signed)
Referral complete.

## 2016-08-19 NOTE — Telephone Encounter (Signed)
Referral still pending please complete

## 2016-08-25 ENCOUNTER — Telehealth: Payer: Self-pay | Admitting: Internal Medicine

## 2016-08-25 NOTE — Telephone Encounter (Signed)
This pt's current pcp is retiring and so she is looking for a new pcp. She was recommended to you by Netta Cedars and Esperanza Heir who are current patients of yours. Would you be willing to take her on as a pt?

## 2016-08-26 DIAGNOSIS — Z803 Family history of malignant neoplasm of breast: Secondary | ICD-10-CM | POA: Diagnosis not present

## 2016-08-26 DIAGNOSIS — Z1231 Encounter for screening mammogram for malignant neoplasm of breast: Secondary | ICD-10-CM | POA: Diagnosis not present

## 2016-08-26 LAB — HM MAMMOGRAPHY

## 2016-08-26 NOTE — Telephone Encounter (Signed)
I am not able to accept new patients at this time.

## 2016-08-27 ENCOUNTER — Encounter: Payer: Self-pay | Admitting: *Deleted

## 2016-08-27 NOTE — Telephone Encounter (Signed)
Spoke with pt and gave her MD response.

## 2016-09-03 ENCOUNTER — Ambulatory Visit (INDEPENDENT_AMBULATORY_CARE_PROVIDER_SITE_OTHER): Payer: Medicare Other | Admitting: Internal Medicine

## 2016-09-03 ENCOUNTER — Other Ambulatory Visit (INDEPENDENT_AMBULATORY_CARE_PROVIDER_SITE_OTHER): Payer: Medicare Other

## 2016-09-03 ENCOUNTER — Encounter: Payer: Self-pay | Admitting: Internal Medicine

## 2016-09-03 DIAGNOSIS — R05 Cough: Secondary | ICD-10-CM | POA: Diagnosis not present

## 2016-09-03 DIAGNOSIS — R053 Chronic cough: Secondary | ICD-10-CM

## 2016-09-03 LAB — CBC WITH DIFFERENTIAL/PLATELET
Basophils Absolute: 0 10*3/uL (ref 0.0–0.1)
Basophils Relative: 0.4 % (ref 0.0–3.0)
Eosinophils Absolute: 0.3 10*3/uL (ref 0.0–0.7)
Eosinophils Relative: 4.7 % (ref 0.0–5.0)
HCT: 38.2 % (ref 36.0–46.0)
HEMOGLOBIN: 12.9 g/dL (ref 12.0–15.0)
LYMPHS PCT: 25.1 % (ref 12.0–46.0)
Lymphs Abs: 1.8 10*3/uL (ref 0.7–4.0)
MCHC: 33.8 g/dL (ref 30.0–36.0)
MCV: 89 fl (ref 78.0–100.0)
MONOS PCT: 9.6 % (ref 3.0–12.0)
Monocytes Absolute: 0.7 10*3/uL (ref 0.1–1.0)
Neutro Abs: 4.4 10*3/uL (ref 1.4–7.7)
Neutrophils Relative %: 60.2 % (ref 43.0–77.0)
Platelets: 204 10*3/uL (ref 150.0–400.0)
RBC: 4.29 Mil/uL (ref 3.87–5.11)
RDW: 14.1 % (ref 11.5–15.5)
WBC: 7.3 10*3/uL (ref 4.0–10.5)

## 2016-09-03 NOTE — Progress Notes (Signed)
Subjective:     Patient ID: [name], female   DOB: 11-Sep-1932, 83 y.o.   MRN: [id number] PCP Gayland Curry, DO  HPI   IOV 09/03/2016  Chief Complaint  Patient presents with  . pulmonary consult    Pt referred by Dr. Jonelle Sidle Reed/ Dr. Early Osmond for COPD. Pt consisting productive coughing since Oct 2017. Pt has coughing up some blood strikes and yellow last two weeks. Pt has some mid back pain with cough. No rescue inhalers, no neb treatments. Pt seen ENT for drainage, concerned regarding cough.      83 year old female self-referred for the possibility of COPD but in talking to light really seems like she is chronic cough  She tells me that she had insidious onset of chronic cough in October 2017. Since then it has been persistent. Moderate in severity. Associated with chest tightness and yellow sputum and occasional streaks of hemoptysis. She's been treated with Z-Pak 2 times once late last year months in spring 2018 without any relief. No history of prednisone intake. She did have a chest x-ray in October 2017 that approximately visualized and is clear. She has seen ENT services for this and was told she has postnasal drip. She could not tolerate nasal steroid. She's been on antihistamine Allegra without any relief. Chart review also shows she has Barrett's esophagus. She is not on associated ace inhibitors. 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   FeNO x 6 attempts could not do  Echocardiogram 07/23/2006) ejection fraction 55%    Dr Lorenza Cambridge Reflux Symptom Index (> 13-15 suggestive of LPR cough) 0 -> 5  =  none ->severe problem 09/03/2016   Hoarseness of problem with voice 2  Clearing  Of Throat 4  Excess throat mucus or feeling of post nasal drip 4  Difficulty swallowing food, liquid or tablets 0  Cough after eating or lying down 2  Breathing difficulties or choking episodes 0  Troublesome or annoying cough 4  Sensation of something sticking in  throat or lump in throat 1  Heartburn, chest pain, indigestion, or stomach acid coming up 1  TOTAL 18     .............................. CLINICAL DATA:  Line persistent cough  EXAM: CHEST  2 VIEW  COMPARISON:  12/31/2012  FINDINGS: There is no focal parenchymal opacity. There is no pleural effusion or pneumothorax. The heart and mediastinal contours are unremarkable. There is a 3 lead AICD. For the there is evidence of prior left axillary dissection.  The osseous structures are unremarkable.  IMPRESSION: No active cardiopulmonary disease.   Electronically Signed   By: Kathreen Devoid   On: 12/17/2015 08:32 ...........................................Marland Kitchen    Results for [name] (MRN [id number]) as of 09/03/2016 12:26  Ref. Range 12/25/2015 12:05 12/29/2015 15:09 01/13/2016 15:20 01/26/2016 15:34 07/07/2016 10:54  Creatinine Latest Ref Range: 0.60 - 0.88 mg/dL     0.83  Results for [name] (MRN [id number]) as of 09/03/2016 12:26  Ref. Range 12/25/2015 12:05 12/29/2015 15:09 01/13/2016 15:20 01/26/2016 15:34 07/07/2016 10:54  Hemoglobin Latest Ref Range: 11.7 - 15.5 g/dL     13.1  Results for [name] (MRN [id number]) as of 09/03/2016 12:26  Ref. Range 10/02/2013 04:43 04/15/2014 10:04 10/18/2014 09:54 11/03/2015 10:52 07/07/2016 10:54  Eos Latest Units: %  4 4      has a past medical history of Anxiety disorder; Barrett's esophagus (04/2008); Benign neoplasm of colon; CHF (congestive heart failure) ([place]); Chronic airway obstruction, not elsewhere  classified; Complication of anesthesia; Depressive disorder, not elsewhere classified; Diverticulitis; Dysthymic disorder; Erosive gastritis (08/2004); GERD (gastroesophageal reflux disease); Hair thinning; HTN (hypertension); Hyperplastic polyps of stomach (06/2002); Hypothyroidism; ICD (implantable cardiac defibrillator) in place; Insomnia (08/20/2015); Kyphosis (08/20/2015); LBBB (left bundle branch block); Melanoma ([place]);  Mitral regurgitation; Nonischemic cardiomyopathy ([place]); Other and unspecified hyperlipidemia; Other and unspecified hyperlipidemia; Pacemaker (04/26/2012); Pain in joint, lower leg; Palpitations; Restless legs syndrome (RLS); Sleep apnea; Synovial cyst of popliteal space; Unspecified chronic bronchitis ([place]); Unspecified nasal polyp; and Unspecified sinusitis (chronic).   reports that she quit smoking about 22 years ago. Her smoking use included Cigarettes. She has a 15.00 pack-year smoking history. She has never used smokeless tobacco.  Past Surgical History:  Procedure Laterality Date  . ABDOMINAL HYSTERECTOMY  1076   endometriosis  . APPENDECTOMY    . BI-VENTRICULAR PACEMAKER INSERTION N/A 04/26/2012   Procedure: BI-VENTRICULAR PACEMAKER INSERTION (CRT-P);  Surgeon: Evans Lance, MD;  Location: [facility] CATH LAB;  Service: Cardiovascular;  Laterality: N/A;  . defibrillator insertion     s/p removal of previously implanted BiV ICD and insertion of a new BiV pacemaker on 04/26/12  . excise mole of lip  2001   Dr. Larena Sox  . excision of wen  1984   Dr. Parks Ranger  . FEMORAL HERNIA REPAIR  12/25/2012   Dr Dalbert Batman  . FEMORAL HERNIA REPAIR  01/04/2013   Recurrent - Dr. Lilyan Punt  . INCISIONAL HERNIA REPAIR N/A 10/01/2013   Procedure: LAPAROSCOPIC INCISIONAL HERNIA;  Surgeon: Gayland Curry, MD;  Location: [facility];  Service: General;  Laterality: N/A;  . INGUINAL HERNIA REPAIR Right 09/26/2012   Procedure: RIGHT INGUINAL HERNIA REPAIR WITH MESH;  Surgeon: Odis Hollingshead, MD;  Location: [facility];  Service: General;  Laterality: Right;  . INGUINAL HERNIA REPAIR Right 12/25/2012   Procedure: explor right groin, small bowel rescection, tissue repair right femoral hernia;  Surgeon: Adin Hector, MD;  Location: [facility];  Service: General;  Laterality: Right;  . INSERTION OF MESH Right 09/26/2012   Procedure: INSERTION OF MESH;  Surgeon: Odis Hollingshead, MD;  Location: [place];  Service: General;  Laterality:  Right;  . INSERTION OF MESH N/A 10/01/2013   Procedure: INSERTION OF MESH;  Surgeon: Gayland Curry, MD;  Location: [facility];  Service: General;  Laterality: N/A;  . LAPAROSCOPY N/A 01/04/2013   Procedure: Diagnostic Laparoscopy, exploratory laparotomy with small bowel resection, closure of right femoral hernia repair;  Surgeon: Madilyn Hook, DO;  Location: [facility];  Service: General;  Laterality: N/A;  . Left arm surgery    . RIGHT BREAST LUMPECTOMY  1988   Dr. Marylene Buerger  . right knee surgery     arthroscopy: Dr. Shellia Carwin  . TONSILLECTOMY      Allergies  Allergen Reactions  . Hydrocodone Itching  . Cephalexin Itching and Swelling  . Ciprofloxacin     Not indicated due to aneurysm in aorta    . Doxycycline Other (See Comments)    Per pt: unknown  . Escitalopram     Dry mouth  . Levofloxacin     Not indicated due to aneurysm in aorta   . Moxifloxacin     Not indicated due to aneurysm in aorta   . Ofloxacin Other (See Comments)    Per pt: unknown  . Sertraline     insomnia  . Tape Other (See Comments)    Burns skin.   . Latex Rash    "If pt. Makes contact  with or wearing"  . Neomycin Rash    Immunization History  Administered Date(s) Administered  . Influenza Whole 12/28/2011, 03/08/2013  . Influenza,inj,Quad PF,36+ Mos 01/08/2014, 11/20/2014, 01/26/2016  . Pneumococcal Polysaccharide-23 03/13/2008  . Tdap 03/16/2011    Family History  Problem Relation Age of Onset  . Stroke Father   . Heart disease Other        maternal side  . Colon cancer Paternal Uncle   . Breast cancer Maternal Aunt   . Diabetes Neg Hx      Current Outpatient Prescriptions:  .  acetaminophen (TYLENOL) 500 MG tablet, Take 500 mg by mouth every 6 (six) hours as needed for pain. For pain, Disp: , Rfl:  .  ALPRAZolam (XANAX) 0.25 MG tablet, TAKE 1 TABLET BY MOUTH UP TO TWICE A DAY IF NEEDED FOR ANXIETY OR SLEEP, Disp: 60 tablet, Rfl: 0 .  Ascorbic Acid (VITAMIN C) 1000 MG tablet, Take 1,000 mg  by mouth daily., Disp: , Rfl:  .  Biotin 5000 MCG TABS, Take 1 tablet by mouth daily., Disp: , Rfl:  .  Calcium Carbonate-Vitamin D (CALCIUM + D PO), Take 2 tablets by mouth daily. , Disp: , Rfl:  .  Cyanocobalamin (VITAMIN B 12 PO), Take 1 tablet by mouth daily. Vitamin B12, Disp: , Rfl:  .  ESTRACE VAGINAL 0.1 MG/GM vaginal cream, Place 1 g vaginally 2 (two) times a week. , Disp: , Rfl:  .  furosemide (LASIX) 20 MG tablet, Take 1 tablet (20 mg total) by mouth daily as needed for fluid or edema., Disp: 30 tablet, Rfl: 6 .  hydroxypropyl methylcellulose (ISOPTO TEARS) 2.5 % ophthalmic solution, Place 1 drop into both eyes 2 (two) times daily as needed for dry eyes., Disp: , Rfl:  .  levothyroxine (SYNTHROID, LEVOTHROID) 112 MCG tablet, TAKE 1 TABLET BY MOUTH EVERY DAY 30 MINUTES BEFORE BREAKFAST FOR THYROID, Disp: 90 tablet, Rfl: 0 .  Magnesium 250 MG TABS, Take 1 tablet by mouth daily. , Disp: , Rfl:  .  Multiple Vitamin (MULTIVITAMIN WITH MINERALS) TABS, Take 1 tablet by mouth daily., Disp: , Rfl:  .  potassium chloride SA (K-DUR,KLOR-CON) 20 MEQ tablet, Take 1 tablet (20 mEq total) by mouth daily as needed (only with lasix)., Disp: 30 tablet, Rfl: 6 .  RABEprazole (ACIPHEX) 20 MG tablet, TAKE 1 TABLET ONCE DAILY   FOR STOMACH *LUPIN MFR*, Disp: 90 tablet, Rfl: 1 .  trandolapril (MAVIK) 2 MG tablet, Take 2 mg twice a day, Disp: 180 tablet, Rfl: 3 .  XARELTO 20 MG TABS tablet, TAKE 1 TABLET DAILY WITH   SUPPER, Disp: 90 tablet, Rfl: 1 .  azithromycin (ZITHROMAX) 250 MG tablet, 2 initially, then 1 daily for infection (Patient not taking: Reported on 09/03/2016), Disp: 6 tablet, Rfl: 0 .  doxepin (SINEQUAN) 25 MG capsule, TAKE 1 CAPSULE BY MOUTH NIGHTLY TO HELP NERVES AND SLEEP (Patient not taking: Reported on 09/03/2016), Disp: 60 capsule, Rfl: 3    Review of Systems     Objective:   Physical Exam  Constitutional: She is oriented to person, place, and time. She appears well-developed and  well-nourished. No distress.  Thin female  HENT:  Head: Normocephalic and atraumatic.  Right Ear: External ear normal.  Left Ear: External ear normal.  Mouth/Throat: Oropharynx is clear and moist. No oropharyngeal exudate.  Mild poast nasal drip +  Eyes: Conjunctivae and EOM are normal. Pupils are equal, round, and reactive to light. Right eye exhibits no discharge. Left  eye exhibits no discharge. No scleral icterus.  Neck: Normal range of motion. Neck supple. No JVD present. No tracheal deviation present. No thyromegaly present.  Cardiovascular: Normal rate, regular rhythm, normal heart sounds and intact distal pulses.  Exam reveals no gallop and no friction rub.   No murmur heard. Pulmonary/Chest: Effort normal and breath sounds normal. No respiratory distress. She has no wheezes. She has no rales. She exhibits no tenderness.  Abdominal: Soft. Bowel sounds are normal. She exhibits no distension and no mass. There is no tenderness. There is no rebound and no guarding.  Musculoskeletal: Normal range of motion. She exhibits no edema or tenderness.  Lymphadenopathy:    She has no cervical adenopathy.  Neurological: She is alert and oriented to person, place, and time. She has normal reflexes. No cranial nerve deficit. She exhibits normal muscle tone. Coordination normal.  Skin: Skin is warm and dry. No rash noted. She is not diaphoretic. No erythema. No pallor.  Psychiatric: She has a normal mood and affect. Her behavior is normal. Judgment and thought content normal.  Vitals reviewed.    Vitals:   09/03/16 1156  BP: 118/70  Pulse: 60  SpO2: 100%  Weight: 132 lb (59.9 kg)  Height: 5\' 8"  (1.727 m)   Estimated body mass index is 20.07 kg/m as calculated from the following:   Height as of this encounter: 5\' 8"  (1.727 m).   Weight as of this encounter: 132 lb (59.9 kg).      Assessment:       ICD-10-CM   1. Chronic cough R05 CBC w/Diff    IgE    CT Chest High Resolution        Plan:     Chronic cough Need to sort out why you have cough   Plan Do cbc with diff, IgE Blood work Do HRCT supiine and prone Do spirometry with BD   Followup  at followup reattempt feno Return to see me or APP next few weeks   Dr. Brand Males, M.D., [facility].C.P Pulmonary and Critical Care Medicine Staff Physician [place] [facility] Pager: [phone], If no answer or between  15:00h - 7:00h: call [phone]  09/03/2016 12:54 PM

## 2016-09-03 NOTE — Assessment & Plan Note (Signed)
Need to sort out why you have cough   Plan Do cbc with diff, IgE Blood work Do HRCT supiine and prone Do spirometry with BD   Followup  at followup reattempt feno Return to see me or APP next few weeks

## 2016-09-03 NOTE — Patient Instructions (Signed)
ICD-10-CM   1. Chronic cough R05    Chronic cough Need to sort out why you have cough   Plan Do cbc with diff, IgE Blood work Do HRCT supiine and prone Do spirometry with BD   Followup  at followup reattempt feno Return to see me or APP next few weeks

## 2016-09-07 ENCOUNTER — Ambulatory Visit (INDEPENDENT_AMBULATORY_CARE_PROVIDER_SITE_OTHER)
Admission: RE | Admit: 2016-09-07 | Discharge: 2016-09-07 | Disposition: A | Discharge status: Home / Self Care | Payer: Medicare Other | Source: Ambulatory Visit | Attending: Internal Medicine | Admitting: Internal Medicine

## 2016-09-07 DIAGNOSIS — J439 Emphysema, unspecified: Secondary | ICD-10-CM | POA: Diagnosis not present

## 2016-09-07 DIAGNOSIS — R05 Cough: Secondary | ICD-10-CM | POA: Diagnosis not present

## 2016-09-07 DIAGNOSIS — R053 Chronic cough: Secondary | ICD-10-CM

## 2016-09-10 ENCOUNTER — Telehealth: Payer: Self-pay | Admitting: Internal Medicine

## 2016-09-10 NOTE — Telephone Encounter (Signed)
MR pt has the CT scan on Tuesday and she is calling for those results.  She stated that it is ok to leave a message on her machine if she is not there. MR please advise. Thanks

## 2016-09-13 NOTE — Telephone Encounter (Signed)
ATC pt x4 and received busy signal. wcb

## 2016-09-13 NOTE — Telephone Encounter (Signed)
LEt [name] know that on CT chest  1) No cancer. THere are some small nodules without change since 2010 but this is not cancer 2) No pulmonary fibrosis BUT 3) she mas mild emphysema - this can be contributing to her cough in full or in part. My rec: she try low dose breo 1 puff daily for 4-6 weeks or till her next appt You can give sample and we can see wht happens  Thanks  .Dr. Brand Males, M.D., [facility].C.P Pulmonary and Critical Care Medicine Staff Physician [place] [facility] Pager: [phone], If no answer or between  15:00h - 7:00h: call [phone]  09/13/2016 12:58 PM       IMPRESSION: 1. No evidence of interstitial lung disease. No acute pulmonary disease. 2. Mild centrilobular emphysema with mild diffuse bronchial wall thickening, suggesting COPD. 3. Tiny scattered pulmonary nodules are all stable since 2010 and considered benign . 4. Left main and 3 vessel coronary atherosclerosis.  Aortic Atherosclerosis (ICD10-I70.0) and Emphysema (ICD10-J43.9).   Electronically Signed   By: Ilona Sorrel M.D.   On: 09/07/2016 16:36

## 2016-09-14 NOTE — Telephone Encounter (Signed)
Pt is aware of results and voiced her understanding. Pt states she has never used an inhaler before and would like to discuss this further with SG tomorrow during her OV on 09/15/16. I have made pt aware that if she decides to proceed with inhaler, SG's nurse will be able to provide her with samples.   Will route to SG to make aware. Thanks.

## 2016-09-14 NOTE — Telephone Encounter (Signed)
Pt returning call and can be reached @ [phone].[name]

## 2016-09-15 ENCOUNTER — Encounter: Payer: Self-pay | Admitting: Acute Care

## 2016-09-15 ENCOUNTER — Ambulatory Visit (INDEPENDENT_AMBULATORY_CARE_PROVIDER_SITE_OTHER): Payer: Medicare Other | Admitting: Internal Medicine

## 2016-09-15 ENCOUNTER — Ambulatory Visit: Payer: Medicare Other | Admitting: Vascular Surgery

## 2016-09-15 ENCOUNTER — Ambulatory Visit (INDEPENDENT_AMBULATORY_CARE_PROVIDER_SITE_OTHER): Payer: Medicare Other | Admitting: Acute Care

## 2016-09-15 ENCOUNTER — Other Ambulatory Visit (HOSPITAL_COMMUNITY): Payer: Medicare Other

## 2016-09-15 DIAGNOSIS — J321 Chronic frontal sinusitis: Secondary | ICD-10-CM | POA: Diagnosis not present

## 2016-09-15 DIAGNOSIS — J42 Unspecified chronic bronchitis: Secondary | ICD-10-CM

## 2016-09-15 DIAGNOSIS — R053 Chronic cough: Secondary | ICD-10-CM

## 2016-09-15 DIAGNOSIS — R05 Cough: Secondary | ICD-10-CM

## 2016-09-15 LAB — PULMONARY FUNCTION TEST
FEF 25-75 POST: 2.07 L/s
FEF 25-75 PRE: 1.52 L/s
FEF2575-%Change-Post: 35 %
FEF2575-%PRED-PRE: 98 %
FEF2575-%Pred-Post: 133 %
FEV1-%Change-Post: 7 %
FEV1-%PRED-PRE: 109 %
FEV1-%Pred-Post: 118 %
FEV1-POST: 2.67 L
FEV1-PRE: 2.48 L
FEV1FVC-%Change-Post: 7 %
FEV1FVC-%PRED-PRE: 93 %
FEV6-%CHANGE-POST: 1 %
FEV6-%PRED-POST: 126 %
FEV6-%PRED-PRE: 124 %
FEV6-POST: 3.63 L
FEV6-Pre: 3.56 L
FEV6FVC-%CHANGE-POST: 1 %
FEV6FVC-%PRED-POST: 105 %
FEV6FVC-%Pred-Pre: 103 %
FVC-%Change-Post: 0 %
FVC-%Pred-Post: 119 %
FVC-%Pred-Pre: 119 %
FVC-PRE: 3.63 L
FVC-Post: 3.63 L
POST FEV6/FVC RATIO: 100 %
PRE FEV1/FVC RATIO: 68 %
Post FEV1/FVC ratio: 74 %
Pre FEV6/FVC Ratio: 98 %

## 2016-09-15 MED ORDER — AMOXICILLIN-POT CLAVULANATE 875-125 MG PO TABS: 1.0000 | ORAL_TABLET | Freq: Two times a day (BID) | ORAL | 0 refills | Status: DC

## 2016-09-15 MED ORDER — FLUTICASONE FUROATE-VILANTEROL 100-25 MCG/INH IN AEPB: 1.0000 | INHALATION_SPRAY | Freq: Every day | RESPIRATORY_TRACT | 0 refills | Status: DC

## 2016-09-15 NOTE — Progress Notes (Signed)
PFT done today. 

## 2016-09-15 NOTE — Progress Notes (Signed)
History of Present Illness [name] is a 83 y.o. female former smoker with chronic cough, pulmonary nodules and changes of emphysema on CT chest.She is followed by Dr. Chase Caller.   09/15/2016 Follow up Spirometry: Pt. Presents for follow up of cough.She had spirometry today, which shows restriction and significant BD response. CT scan done 09/10/2016 indicated high suspicion of emphysema. She is complaining today of increased sinus congestion with yellow to tan thick secretions with some flecks of blood, in addition to her baseline cough. She states her cough is about the same. She has frequent throat cearing. She states she is compliant with her Aciphex daily. She has some sinus tenderness. She denies fever, chest pain, orthopnea or hemoptysis.  Test Results: Spirometry 09/15/2016:  FVC % predicted= 119 FEV1 % predicted= 109 F/F ratio 5 predicted = 93 BD response  FEF 25-75%=  + 35%  CBC Latest Ref Rng & Units 09/03/2016 07/07/2016 11/03/2015  WBC 4.0 - 10.5 K/uL 7.3 6.3 6.9  Hemoglobin 12.0 - 15.0 g/dL 12.9 13.1 13.4  Hematocrit 36.0 - 46.0 % 38.2 38.9 40.7  Platelets 150.0 - 400.0 K/uL 204.0 206 193    BMP Latest Ref Rng & Units 07/07/2016 11/03/2015 08/18/2015  Glucose 65 - 99 mg/dL 77 81 81  BUN 7 - 25 mg/dL 19 17 18   Creatinine 0.60 - 0.88 mg/dL 0.83 0.68 0.76  BUN/Creat Ratio 12 - 28 - - 24  Sodium 135 - 146 mmol/L 141 138 137  Potassium 3.5 - 5.3 mmol/L 4.6 4.8 4.4  Chloride 98 - 110 mmol/L 107 104 98  CO2 20 - 31 mmol/L 26 28 23   Calcium 8.6 - 10.4 mg/dL 9.4 9.2 9.3    ProBNP    Component Value Date/Time   PROBNP 111.0 (H) 06/08/2011 1440    PFT    Component Value Date/Time   FEV1PRE 2.48 09/15/2016 1155   FEV1POST 2.67 09/15/2016 1155   FVCPRE 3.63 09/15/2016 1155   FVCPOST 3.63 09/15/2016 1155   PREFEV1FVCRT 68 09/15/2016 1155   PSTFEV1FVCRT 74 09/15/2016 1155    Ct Chest High Resolution  Result Date: 09/07/2016 CLINICAL DATA:  Chronic productive cough  since October 2017. EXAM: CT CHEST WITHOUT CONTRAST TECHNIQUE: Multidetector CT imaging of the chest was performed following the standard protocol without intravenous contrast. High resolution imaging of the lungs, as well as inspiratory and expiratory imaging, was performed. COMPARISON:  12/16/2015 chest radiograph.   10/04/2008 chest CT. FINDINGS: Cardiovascular: Normal heart size. Trace pericardial effusion/ thickening, slightly increased. Left main, left anterior descending, left circumflex and right coronary atherosclerosis. 3 lead left subclavian ICD is noted with lead tips in the right atrium, right ventricular apex and coronary sinus. Atherosclerotic nonaneurysmal thoracic aorta. Normal caliber pulmonary arteries. Mediastinum/Nodes: Thyroid is either atrophic or surgically absent. Unremarkable esophagus. No pathologically enlarged axillary, mediastinal or gross hilar lymph nodes, noting limited sensitivity for the detection of hilar adenopathy on this noncontrast study. Multiple left axillary surgical clips are again noted. Lungs/Pleura: No pneumothorax. No pleural effusion. Mild centrilobular emphysema with mild diffuse bronchial wall thickening. Ground-glass apical left upper lobe 4 mm nodule (series 5/ image 18), not appreciably changed since 10/04/2008. Tiny scattered solid pulmonary nodules, largest 3 mm in the anterior right lower lobe (series 5/ image 118), both stable since 10/04/2008 and considered benign. No acute consolidative airspace disease, lung masses or new significant pulmonary nodules. No significant regions of subpleural reticulation, ground-glass attenuation, traction bronchiectasis, parenchymal banding, architectural distortion or frank honeycombing. No significant  lobular air trapping on the expiration sequence. Upper abdomen: Unremarkable. Musculoskeletal: No aggressive appearing focal osseous lesions. Mild to moderate thoracic spondylosis. IMPRESSION: 1. No evidence of interstitial  lung disease. No acute pulmonary disease. 2. Mild centrilobular emphysema with mild diffuse bronchial wall thickening, suggesting COPD. 3. Tiny scattered pulmonary nodules are all stable since 2010 and considered benign . 4. Left main and 3 vessel coronary atherosclerosis. Aortic Atherosclerosis (ICD10-I70.0) and Emphysema (ICD10-J43.9). Electronically Signed   By: Ilona Sorrel M.D.   On: 09/07/2016 16:36     Past medical hx Past Medical History:  Diagnosis Date  . Anxiety disorder   . Barrett's esophagus 04/2008  . Benign neoplasm of colon   . CHF (congestive heart failure) ([place])   . Chronic airway obstruction, not elsewhere classified   . Complication of anesthesia    slow to wake up  . Depressive disorder, not elsewhere classified   . Diverticulitis   . Dysthymic disorder   . Erosive gastritis 08/2004  . GERD (gastroesophageal reflux disease)   . Hair thinning   . HTN (hypertension)   . Hyperplastic polyps of stomach 06/2002  . Hypothyroidism   . ICD (implantable cardiac defibrillator) in place    BiV/ICD; s/p removal of ICD with insertion of BiV PPM 04/26/12  . Insomnia 08/20/2015  . Kyphosis 08/20/2015  . LBBB (left bundle branch block)   . Melanoma ([place])    right arm  . Mitral regurgitation   . Nonischemic cardiomyopathy ([place])    EF initially 20%; Last measurement up to 50% per echo in August of 2012  . Other and unspecified hyperlipidemia   . Other and unspecified hyperlipidemia   . Pacemaker 04/26/2012  . Pain in joint, lower leg   . Palpitations   . Restless legs syndrome (RLS)   . Sleep apnea   . Synovial cyst of popliteal space   . Unspecified chronic bronchitis ([place])   . Unspecified nasal polyp   . Unspecified sinusitis (chronic)      Social History  Substance Use Topics  . Smoking status: Former Smoker    Packs/day: 1.00    Years: 15.00    Types: Cigarettes    Quit date: 12/22/1993  . Smokeless tobacco: Never Used     Comment: Does'nt reall year quit   .  Alcohol use No    Tobacco Cessation: Former smoker quit 1995  Past surgical hx, Family hx, Social hx all reviewed.  Current Outpatient Prescriptions on File Prior to Visit  Medication Sig  . acetaminophen (TYLENOL) 500 MG tablet Take 500 mg by mouth every 6 (six) hours as needed for pain. For pain  . ALPRAZolam (XANAX) 0.25 MG tablet TAKE 1 TABLET BY MOUTH UP TO TWICE A DAY IF NEEDED FOR ANXIETY OR SLEEP  . Ascorbic Acid (VITAMIN C) 1000 MG tablet Take 1,000 mg by mouth daily.  . Biotin 5000 MCG TABS Take 1 tablet by mouth daily.  . Calcium Carbonate-Vitamin D (CALCIUM + D PO) Take 2 tablets by mouth daily.   . Cyanocobalamin (VITAMIN B 12 PO) Take 1 tablet by mouth daily. Vitamin B12  . doxepin (SINEQUAN) 25 MG capsule TAKE 1 CAPSULE BY MOUTH NIGHTLY TO HELP NERVES AND SLEEP  . ESTRACE VAGINAL 0.1 MG/GM vaginal cream Place 1 g vaginally 2 (two) times a week.   . furosemide (LASIX) 20 MG tablet Take 1 tablet (20 mg total) by mouth daily as needed for fluid or edema.  . hydroxypropyl methylcellulose (ISOPTO TEARS) 2.5 %  ophthalmic solution Place 1 drop into both eyes 2 (two) times daily as needed for dry eyes.  Marland Kitchen levothyroxine (SYNTHROID, LEVOTHROID) 112 MCG tablet TAKE 1 TABLET BY MOUTH EVERY DAY 30 MINUTES BEFORE BREAKFAST FOR THYROID  . Magnesium 250 MG TABS Take 1 tablet by mouth daily.   . Multiple Vitamin (MULTIVITAMIN WITH MINERALS) TABS Take 1 tablet by mouth daily.  . potassium chloride SA (K-DUR,KLOR-CON) 20 MEQ tablet Take 1 tablet (20 mEq total) by mouth daily as needed (only with lasix).  . RABEprazole (ACIPHEX) 20 MG tablet TAKE 1 TABLET ONCE DAILY   FOR STOMACH *LUPIN MFR*  . trandolapril (MAVIK) 2 MG tablet Take 2 mg twice a day  . XARELTO 20 MG TABS tablet TAKE 1 TABLET DAILY WITH   SUPPER  . [DISCONTINUED] omeprazole (PRILOSEC) 20 MG capsule Take 1 capsule (20 mg total) by mouth daily.   No current facility-administered medications on file prior to visit.       Allergies  Allergen Reactions  . Hydrocodone Itching  . Cephalexin Itching and Swelling  . Ciprofloxacin     Not indicated due to aneurysm in aorta    . Doxycycline Other (See Comments)    Per pt: unknown  . Escitalopram     Dry mouth  . Levofloxacin     Not indicated due to aneurysm in aorta   . Moxifloxacin     Not indicated due to aneurysm in aorta   . Ofloxacin Other (See Comments)    Per pt: unknown  . Sertraline     insomnia  . Tape Other (See Comments)    Burns skin.   . Latex Rash    "If pt. Makes contact with or wearing"  . Neomycin Rash    Review Of Systems:  Constitutional:   No  weight loss, night sweats,  Fevers, chills, fatigue, or  lassitude.  HEENT:   No headaches,  Difficulty swallowing,  Tooth/dental problems, or  Sore throat,                No sneezing, itching, ear ache, +nasal congestion, +post nasal drip, +sinus tenderness  CV:  No chest pain,  Orthopnea, PND, swelling in lower extremities, anasarca, dizziness, palpitations, syncope.   GI  No heartburn, indigestion, abdominal pain, nausea, vomiting, diarrhea, change in bowel habits, loss of appetite, bloody stools.   Resp: No shortness of breath with exertion or at rest.  + excess mucus, + productive cough,  + non-productive cough,  No coughing up of blood.  No change in color of mucus.  No wheezing.  No chest wall deformity  Skin: no rash or lesions.  GU: no dysuria, change in color of urine, no urgency or frequency.  No flank pain, no hematuria   MS:  No joint pain or swelling.  No decreased range of motion.  No back pain.  Psych:  No change in mood or affect. No depression or anxiety.  No memory loss.   Vital Signs BP (!) 106/56 (BP Location: Left Arm, Cuff Size: Normal)   Pulse 67   Ht 5\' 8"  (1.727 m)   Wt 133 lb (60.3 kg)   SpO2 97%   BMI 20.22 kg/m    Physical Exam:  General- No distress,  A&Ox3,pleasant  ENT: + sinus tenderness, TM clear, pale nasal mucosa, no oral  exudate,no post nasal drip, no LAN Cardiac: S1, S2, regular rate and rhythm, no murmur Chest: No wheeze/ rales/ dullness; no accessory muscle use, no nasal flaring,  no sternal retractions Abd.: Soft Non-tender, Bowel sounds positive Ext: No clubbing cyanosis, edema Neuro:  normal strength Skin: No rashes, warm and dry Psych: normal mood and behavior   Assessment/Plan  Chronic bronchitis (HCC) Continued cough Plan We will start Breo 1 puff once daily as a therapeutic trial We will teach you how to use it. Follow up in 1 month to evaluate effectiveness Continue Acifex daily Sugar free hard candies for throat soothing. Sips of water instead of throat clearing GERD diet. Please contact office for sooner follow up if symptoms do not improve or worsen or seek emergency care    Sinusitis, chronic Sinus tenderness Brown thick secretions that are blood-tinged Plan Amoxicillin/ Clav  875/125 BID x 7 days Eat yogurt or take probiotic tablets while on antibiotics. Follow-up in one month with Dr. Chase Caller Please contact office for sooner follow up if symptoms do not improve or worsen or seek emergency care     Magdalen Spatz, NP 09/15/2016  11:00 PM

## 2016-09-15 NOTE — Assessment & Plan Note (Signed)
Continued cough Plan We will start Breo 1 puff once daily as a therapeutic trial We will teach you how to use it. Follow up in 1 month to evaluate effectiveness Continue Acifex daily Sugar free hard candies for throat soothing. Sips of water instead of throat clearing GERD diet. Please contact office for sooner follow up if symptoms do not improve or worsen or seek emergency care

## 2016-09-15 NOTE — Assessment & Plan Note (Addendum)
Sinus tenderness Brown thick secretions that are blood-tinged Plan Amoxicillin/ Clav  875/125 BID x 7 days Eat yogurt or take probiotic tablets while on antibiotics. Follow-up in one month with Dr. Chase Caller Please contact office for sooner follow up if symptoms do not improve or worsen or seek emergency care

## 2016-09-15 NOTE — Patient Instructions (Addendum)
Amoxicillin/ Clav  875/125 BID x 7 days Eat yogurt or take probiotic tablets while on antibiotics. We will start Breo 1 puff once daily as a therapeutic trial We will teach you how to use it. Follow up in 1 month to evaluate effectiveness Continue Acifex daily Sugar free hard candies for throat soothing. Sips of water instead of throat clearing GERD diet. Please contact office for sooner follow up if symptoms do not improve or worsen or seek emergency care

## 2016-09-16 NOTE — Telephone Encounter (Signed)
Patient came for her appt with SG yesterday and this was discussed. Looks like nothing is further is needed

## 2016-09-17 ENCOUNTER — Institutional Professional Consult (permissible substitution): Payer: Medicare Other | Admitting: Internal Medicine

## 2016-09-22 ENCOUNTER — Telehealth: Payer: Self-pay | Admitting: Cardiology

## 2016-09-22 NOTE — Telephone Encounter (Signed)
New Message   Please call pt would not say what call is regarding, wants Malachy Mood to call her whenever she can

## 2016-09-22 NOTE — Telephone Encounter (Signed)
Routed to Cheryl, LPN per patient request 

## 2016-09-22 NOTE — Telephone Encounter (Signed)
Returned call to patient.She stated she went to bathroom in the middle of night last night.Stated she went to sit down on bed and missed the bed,sat down on floor.Stated lamp fell and hit her head and jaw.Stated no knot noticed on head,no headache,no bleeding.Jaw just alittle sore.Advised to monitor and call back if she develops a headache.

## 2016-09-29 ENCOUNTER — Other Ambulatory Visit (HOSPITAL_COMMUNITY): Payer: Medicare Other

## 2016-09-29 ENCOUNTER — Ambulatory Visit: Payer: Medicare Other | Admitting: Vascular Surgery

## 2016-10-13 ENCOUNTER — Ambulatory Visit (INDEPENDENT_AMBULATORY_CARE_PROVIDER_SITE_OTHER): Payer: Medicare Other | Admitting: *Deleted

## 2016-10-13 DIAGNOSIS — I5022 Chronic systolic (congestive) heart failure: Secondary | ICD-10-CM

## 2016-10-13 DIAGNOSIS — I447 Left bundle-branch block, unspecified: Secondary | ICD-10-CM | POA: Diagnosis not present

## 2016-10-13 DIAGNOSIS — I420 Dilated cardiomyopathy: Secondary | ICD-10-CM | POA: Diagnosis not present

## 2016-10-13 LAB — CUP PACEART INCLINIC DEVICE CHECK
Brady Statistic RV Percent Paced: 100 %
Date Time Interrogation Session: 20180808040000
Implantable Lead Implant Date: 20070309
Implantable Lead Location: 753858
Implantable Lead Location: 753860
Implantable Lead Model: 158
Implantable Lead Serial Number: 268502
Lead Channel Impedance Value: 383 Ohm
Lead Channel Pacing Threshold Amplitude: 1.4 V
Lead Channel Pacing Threshold Pulse Width: 1 ms
Lead Channel Sensing Intrinsic Amplitude: 16 mV
Lead Channel Sensing Intrinsic Amplitude: 5.8 mV
Lead Channel Setting Pacing Amplitude: 2.5 V
Lead Channel Setting Sensing Sensitivity: 2.5 mV
MDC IDC LEAD IMPLANT DT: 20070309
MDC IDC LEAD IMPLANT DT: 20070309
MDC IDC LEAD LOCATION: 753859
MDC IDC LEAD SERIAL: 121652
MDC IDC LEAD SERIAL: 168416
MDC IDC MSMT LEADCHNL LV IMPEDANCE VALUE: 600 Ohm
MDC IDC MSMT LEADCHNL LV PACING THRESHOLD PULSEWIDTH: 1 ms
MDC IDC MSMT LEADCHNL RA IMPEDANCE VALUE: 530 Ohm
MDC IDC MSMT LEADCHNL RA PACING THRESHOLD AMPLITUDE: 0.8 V
MDC IDC MSMT LEADCHNL RA PACING THRESHOLD PULSEWIDTH: 0.4 ms
MDC IDC MSMT LEADCHNL RA SENSING INTR AMPL: 2.3 mV
MDC IDC MSMT LEADCHNL RV PACING THRESHOLD AMPLITUDE: 2.7 V
MDC IDC PG IMPLANT DT: 20140219
MDC IDC PG SERIAL: 100543
MDC IDC SET LEADCHNL LV PACING PULSEWIDTH: 1 ms
MDC IDC SET LEADCHNL RA PACING AMPLITUDE: 2 V
MDC IDC SET LEADCHNL RV PACING AMPLITUDE: 4 V
MDC IDC SET LEADCHNL RV PACING PULSEWIDTH: 1 ms
MDC IDC SET LEADCHNL RV SENSING SENSITIVITY: 2.5 mV
MDC IDC STAT BRADY RA PERCENT PACED: 24 %

## 2016-10-14 NOTE — Progress Notes (Signed)
CRT-P device check in clinic. Normal device function. Thresholds, sensing, impedance consistent with previous measurements. Histograms appropriate for patient and level of activity. No mode switches or ventricular high rate episodes. She is on Xarelto for previously noted PAF. Patient bi-ventricularly pacing 100% of the time. Device programmed with appropriate safety margins. Estimated longevity 2 years. Patient declines in remote follow-up. ROV with GT in 6 months.

## 2016-10-15 ENCOUNTER — Other Ambulatory Visit: Payer: Self-pay

## 2016-10-15 DIAGNOSIS — I714 Abdominal aortic aneurysm, without rupture, unspecified: Secondary | ICD-10-CM

## 2016-10-19 ENCOUNTER — Encounter: Payer: Self-pay | Admitting: Vascular Surgery

## 2016-10-20 ENCOUNTER — Other Ambulatory Visit (INDEPENDENT_AMBULATORY_CARE_PROVIDER_SITE_OTHER): Payer: Medicare Other

## 2016-10-20 ENCOUNTER — Ambulatory Visit (INDEPENDENT_AMBULATORY_CARE_PROVIDER_SITE_OTHER): Payer: Medicare Other | Admitting: Acute Care

## 2016-10-20 ENCOUNTER — Encounter: Payer: Self-pay | Admitting: Acute Care

## 2016-10-20 ENCOUNTER — Ambulatory Visit (INDEPENDENT_AMBULATORY_CARE_PROVIDER_SITE_OTHER)
Admission: RE | Admit: 2016-10-20 | Discharge: 2016-10-20 | Disposition: A | Discharge status: Home / Self Care | Payer: Medicare Other | Source: Ambulatory Visit | Attending: Acute Care | Admitting: Acute Care

## 2016-10-20 ENCOUNTER — Other Ambulatory Visit: Payer: Self-pay | Admitting: Acute Care

## 2016-10-20 VITALS — BP 122/62 | HR 60 | Ht 68.0 in | Wt 130.0 lb

## 2016-10-20 DIAGNOSIS — R053 Chronic cough: Secondary | ICD-10-CM

## 2016-10-20 DIAGNOSIS — J321 Chronic frontal sinusitis: Secondary | ICD-10-CM | POA: Diagnosis not present

## 2016-10-20 DIAGNOSIS — R05 Cough: Secondary | ICD-10-CM

## 2016-10-20 DIAGNOSIS — J42 Unspecified chronic bronchitis: Secondary | ICD-10-CM

## 2016-10-20 LAB — CBC WITH DIFFERENTIAL/PLATELET
BASOS ABS: 0 10*3/uL (ref 0.0–0.1)
BASOS PCT: 0.3 % (ref 0.0–3.0)
EOS ABS: 0.3 10*3/uL (ref 0.0–0.7)
Eosinophils Relative: 4.2 % (ref 0.0–5.0)
HCT: 38.7 % (ref 36.0–46.0)
HEMOGLOBIN: 12.7 g/dL (ref 12.0–15.0)
Lymphocytes Relative: 23.1 % (ref 12.0–46.0)
Lymphs Abs: 1.7 10*3/uL (ref 0.7–4.0)
MCHC: 32.8 g/dL (ref 30.0–36.0)
MCV: 91.4 fl (ref 78.0–100.0)
MONOS PCT: 9.4 % (ref 3.0–12.0)
Monocytes Absolute: 0.7 10*3/uL (ref 0.1–1.0)
NEUTROS ABS: 4.7 10*3/uL (ref 1.4–7.7)
Neutrophils Relative %: 63 % (ref 43.0–77.0)
PLATELETS: 213 10*3/uL (ref 150.0–400.0)
RBC: 4.23 Mil/uL (ref 3.87–5.11)
RDW: 14.5 % (ref 11.5–15.5)
WBC: 7.4 10*3/uL (ref 4.0–10.5)

## 2016-10-20 NOTE — Assessment & Plan Note (Signed)
Sinus tenderness has resolved, Secretions are no longer discolored are blood-tinged Sinusitis has resolved with treatment (amoxicillin/Clav  875/125 twice a day 7 days) Continued chest congestion with minimally productive cough Plan Stop Breo as you had a bad response to it. We will add Mucinex 1200 mg once daily for chest congestion.  Take with full glass of water. Allegra every night  Add the Nasocort 2 sprays in each nostril once daily. Continue Acifex daily as you have been doing. Delsym every 12 hours for cough. Sips of water instead of throat clearing Sugar free hard candy for throat soothing. No mint or menthol, no chocolate. Follow up with Dr. Chase Caller or Eric Form, NP in  4 weeks Please contact office for sooner follow up if symptoms do not improve or worsen or seek emergency care

## 2016-10-20 NOTE — Progress Notes (Signed)
History of Present Illness [name] is a 83 y.o. female former smoker with chronic cough, pulmonary nodules and changes of emphysema on CT chest. She is followed by Dr. Chase Caller.   10/20/2016 Follow up OV for sinusitis/ bronchitis:  Pt. Presents for follow up. She was seen 09/15/2016 for cough and sinusitis. Plan of care developed at that visit is as follows:  Chronic bronchitis ([name]) Continued cough Plan We will start Breo 1 puff once daily as a therapeutic trial We will teach you how to use it. Follow up in 1 month to evaluate effectiveness Continue Acifex daily Sugar free hard candies for throat soothing. Sips of water instead of throat clearing GERD diet. Please contact office for sooner follow up if symptoms do not improve or worsen or seek emergency care    Sinusitis, chronic Sinus tenderness Brown thick secretions that are blood-tinged Plan Amoxicillin/ Clav  875/125 BID x 7 days Eat yogurt or take probiotic tablets while on antibiotics. Follow-up in one month with Dr. Chase Caller Please contact office for sooner follow up if symptoms do not improve or worsen or seek emergency care   Pt. Presents for follow up. She states she did  use the [facility] as a therapeutic trial, but stated that she developed a fever, aches  and worsening cough with the Breo. She states she read all these side effects on line.She did not call the office and let us know she had had an adverse reaction to the medication. She stopped it on her own, and symptoms resolved. She states she is continuing to cough. Cough is productive for yellow secretions. She states that she has had this cough since   October 2017. She completed the Augmentin prescribed 09/15/2016 for her sinusitis .She states her sinusitis has cleared up. She still sounds congested. She is taking her Allegra every other day, instead of every day.She is not using the Nasacort she was directed to use. She is not taking any medication for  cough. She states her cough only produces minimal secretions that are white to yellow. She has Mucinex, but is not using it. She clearly still has postnasal drip. She denies fever, chest pain, orthopnea, or hemoptysis.  Test Results: Chest x-ray 10/20/2016 No acute abnormality noted.  CBC Latest Ref Rng & Units 10/20/2016 09/03/2016 07/07/2016  WBC 4.0 - 10.5 K/uL 7.4 7.3 6.3  Hemoglobin 12.0 - 15.0 g/dL 12.7 12.9 13.1  Hematocrit 36.0 - 46.0 % 38.7 38.2 38.9  Platelets 150.0 - 400.0 K/uL 213.0 204.0 206    BMP Latest Ref Rng & Units 07/07/2016 11/03/2015 08/18/2015  Glucose 65 - 99 mg/dL 77 81 81  BUN 7 - 25 mg/dL 19 17 18   Creatinine 0.60 - 0.88 mg/dL 0.83 0.68 0.76  BUN/Creat Ratio 12 - 28 - - 24  Sodium 135 - 146 mmol/L 141 138 137  Potassium 3.5 - 5.3 mmol/L 4.6 4.8 4.4  Chloride 98 - 110 mmol/L 107 104 98  CO2 20 - 31 mmol/L 26 28 23   Calcium 8.6 - 10.4 mg/dL 9.4 9.2 9.3    ProBNP    Component Value Date/Time   PROBNP 111.0 (H) 06/08/2011 1440    PFT    Component Value Date/Time   FEV1PRE 2.48 09/15/2016 1155   FEV1POST 2.67 09/15/2016 1155   FVCPRE 3.63 09/15/2016 1155   FVCPOST 3.63 09/15/2016 1155   PREFEV1FVCRT 68 09/15/2016 1155   PSTFEV1FVCRT 74 09/15/2016 1155    Dg Chest 2 View  Result Date: 10/20/2016  CLINICAL DATA:  Chronic cough EXAM: CHEST  2 VIEW COMPARISON:  09/07/2016 FINDINGS: Cardiac shadow is within normal limits. Aortic calcifications are seen. Defibrillator is noted. The lungs are well aerated bilaterally without focal infiltrate. No sizable parenchymal nodule is seen. Postsurgical changes in the left axilla are noted. No acute bony abnormality is seen. IMPRESSION: No acute abnormality noted. Aortic Atherosclerosis (ICD10-170.0) Electronically Signed   By: Inez Catalina M.D.   On: 10/20/2016 17:19     Past medical hx Past Medical History:  Diagnosis Date  . Anxiety disorder   . Barrett's esophagus 04/2008  . Benign neoplasm of colon   . CHF  (congestive heart failure) ([place])   . Chronic airway obstruction, not elsewhere classified   . Complication of anesthesia    slow to wake up  . Depressive disorder, not elsewhere classified   . Diverticulitis   . Dysthymic disorder   . Erosive gastritis 08/2004  . GERD (gastroesophageal reflux disease)   . Hair thinning   . HTN (hypertension)   . Hyperplastic polyps of stomach 06/2002  . Hypothyroidism   . ICD (implantable cardiac defibrillator) in place    BiV/ICD; s/p removal of ICD with insertion of BiV PPM 04/26/12  . Insomnia 08/20/2015  . Kyphosis 08/20/2015  . LBBB (left bundle branch block)   . Melanoma ([place])    right arm  . Mitral regurgitation   . Nonischemic cardiomyopathy ([place])    EF initially 20%; Last measurement up to 50% per echo in August of 2012  . Other and unspecified hyperlipidemia   . Other and unspecified hyperlipidemia   . Pacemaker 04/26/2012  . Pain in joint, lower leg   . Palpitations   . Restless legs syndrome (RLS)   . Sleep apnea   . Synovial cyst of popliteal space   . Unspecified chronic bronchitis ([place])   . Unspecified nasal polyp   . Unspecified sinusitis (chronic)      Social History  Substance Use Topics  . Smoking status: Former Smoker    Packs/day: 1.00    Years: 15.00    Types: Cigarettes    Quit date: 12/22/1993  . Smokeless tobacco: Never Used     Comment: Does'nt reall year quit   . Alcohol use No    Ms.Muscarella reports that she quit smoking about 22 years ago. Her smoking use included Cigarettes. She has a 15.00 pack-year smoking history. She has never used smokeless tobacco. She reports that she does not drink alcohol or use drugs.  Tobacco Cessation: Former smoker quit in 1995  Past surgical hx, Family hx, Social hx all reviewed.  Current Outpatient Prescriptions on File Prior to Visit  Medication Sig  . acetaminophen (TYLENOL) 500 MG tablet Take 500 mg by mouth every 6 (six) hours as needed for pain. For pain  .  ALPRAZolam (XANAX) 0.25 MG tablet TAKE 1 TABLET BY MOUTH UP TO TWICE A DAY IF NEEDED FOR ANXIETY OR SLEEP  . Ascorbic Acid (VITAMIN C) 1000 MG tablet Take 1,000 mg by mouth daily.  . Biotin 5000 MCG TABS Take 1 tablet by mouth daily.  . Calcium Carbonate-Vitamin D (CALCIUM + D PO) Take 2 tablets by mouth daily.   . Cyanocobalamin (VITAMIN B 12 PO) Take 1 tablet by mouth daily. Vitamin B12  . doxepin (SINEQUAN) 25 MG capsule TAKE 1 CAPSULE BY MOUTH NIGHTLY TO HELP NERVES AND SLEEP  . ESTRACE VAGINAL 0.1 MG/GM vaginal cream Place 1 g vaginally 2 (two) times a week.   Marland Kitchen  furosemide (LASIX) 20 MG tablet Take 1 tablet (20 mg total) by mouth daily as needed for fluid or edema.  . hydroxypropyl methylcellulose (ISOPTO TEARS) 2.5 % ophthalmic solution Place 1 drop into both eyes 2 (two) times daily as needed for dry eyes.  Marland Kitchen levothyroxine (SYNTHROID, LEVOTHROID) 112 MCG tablet TAKE 1 TABLET BY MOUTH EVERY DAY 30 MINUTES BEFORE BREAKFAST FOR THYROID  . Magnesium 250 MG TABS Take 1 tablet by mouth daily.   . Multiple Vitamin (MULTIVITAMIN WITH MINERALS) TABS Take 1 tablet by mouth daily.  . potassium chloride SA (K-DUR,KLOR-CON) 20 MEQ tablet Take 1 tablet (20 mEq total) by mouth daily as needed (only with lasix).  . RABEprazole (ACIPHEX) 20 MG tablet TAKE 1 TABLET ONCE DAILY   FOR STOMACH *LUPIN MFR*  . trandolapril (MAVIK) 2 MG tablet Take 2 mg twice a day  . XARELTO 20 MG TABS tablet TAKE 1 TABLET DAILY WITH   SUPPER  . fluticasone furoate-vilanterol (BREO ELLIPTA) 100-25 MCG/INH AEPB Inhale 1 puff into the lungs daily. (Patient not taking: Reported on 10/20/2016)  . [DISCONTINUED] omeprazole (PRILOSEC) 20 MG capsule Take 1 capsule (20 mg total) by mouth daily.   No current facility-administered medications on file prior to visit.      Allergies  Allergen Reactions  . Hydrocodone Itching  . Cephalexin Itching and Swelling  . Ciprofloxacin     Not indicated due to aneurysm in aorta    . Doxycycline  Other (See Comments)    Per pt: unknown  . Escitalopram     Dry mouth  . Levofloxacin     Not indicated due to aneurysm in aorta   . Moxifloxacin     Not indicated due to aneurysm in aorta   . Ofloxacin Other (See Comments)    Per pt: unknown  . Sertraline     insomnia  . Tape Other (See Comments)    Burns skin.   . Latex Rash    "If pt. Makes contact with or wearing"  . Neomycin Rash    Review Of Systems:  Constitutional:   No  weight loss, night sweats,  Fevers, chills, fatigue, or  lassitude.  HEENT:   No headaches,  Difficulty swallowing,  Tooth/dental problems, or  Sore throat,                No sneezing, itching, ear ache, +nasal congestion,+ post nasal drip,   CV:  No chest pain,  Orthopnea, PND, swelling in lower extremities, anasarca, dizziness, palpitations, syncope.   GI  No heartburn, indigestion, abdominal pain, nausea, vomiting, diarrhea, change in bowel habits, loss of appetite, bloody stools.   Resp: No shortness of breath with exertion or at rest.  No excess mucus, + productive cough,  No non-productive cough,  No coughing up of blood.  No change in color of mucus.  No wheezing.  No chest wall deformity  Skin: no rash or lesions.  GU: no dysuria, change in color of urine, no urgency or frequency.  No flank pain, no hematuria   MS:  No joint pain or swelling.  No decreased range of motion.  No back pain.  Psych:  No change in mood or affect. No depression or anxiety.  No memory loss.   Vital Signs BP 122/62 (BP Location: Right Arm, Patient Position: Sitting, Cuff Size: Normal)   Pulse 60   Ht 5\' 8"  (1.727 m)   Wt 130 lb (59 kg)   SpO2 96%   BMI 19.77 kg/m  Physical Exam:  General- No distress,  A&Ox3, pleasant ENT: No sinus tenderness, TM clear, pale nasal mucosa, no oral exudate,no post nasal drip, no LAN Cardiac: S1, S2, regular rate and rhythm, no murmur Chest: No wheeze/ rales/ dullness; no accessory muscle use, no nasal flaring, no  sternal retractions Abd.: Soft Non-tender, nondistended, BS positive Ext: No clubbing cyanosis, edema Neuro:  normal strength Skin: No rashes, warm and dry Psych: normal mood and behavior   Assessment/Plan  Sinusitis, chronic Sinus tenderness has resolved, Secretions are no longer discolored are blood-tinged Sinusitis has resolved with treatment (amoxicillin/Clav  875/125 twice a day 7 days) Continued chest congestion with minimally productive cough Plan Stop Breo as you had a bad response to it. We will add Mucinex 1200 mg once daily for chest congestion.  Take with full glass of water. Allegra every night  Add the Nasocort 2 sprays in each nostril once daily. Continue Acifex daily as you have been doing. Delsym every 12 hours for cough. Sips of water instead of throat clearing Sugar free hard candy for throat soothing. No mint or menthol, no chocolate. Follow up with Dr. Chase Caller or Eric Form, NP in  4 weeks Please contact office for sooner follow up if symptoms do not improve or worsen or seek emergency care    Chronic bronchitis ([place]) Continued bronchitis Chest x-ray with no acute cardiopulmonary issues Noncompliant with medical regimen Refuses treatment with prednisone taper Plan Stop Breo as you had a bad response to it. CXR today. We will call you with results  IgE Serum Allergy profile. CBC We will add Mucinex 1200 mg once daily for chest congestion.  Take with full glass of water. Allegra every night  Add the Nasocort 2 sprays in each nostril once daily. Continue Acifex daily as you have been doing. Delsym every 12 hours for cough. Sips of water instead of throat clearing Sugar free hard candy for throat soothing. No mint or menthol, no chocolate. Consider CT chest if remains unresolved after current treatment Follow up with Dr. Chase Caller or Eric Form, NP in  4 weeks Please contact office for sooner follow up if symptoms do not improve or worsen or  seek emergency care      Magdalen Spatz, NP 10/20/2016  9:52 PM

## 2016-10-20 NOTE — Patient Instructions (Addendum)
It is nice to see you today. Stop Breo as you had a bad response to it. CXR today. We will call you with results  IgE Serum Allergy profile. CBC We will add Mucinex 1200 mg once daily for chest congestion.  Take with full glass of water. Allegra every night  Add the Nasocort 2 sprays in each nostril once daily. Continue Acifex daily as you have been doing. Delsym every 12 hours for cough. Sips of water instead of throat clearing Sugar free hard candy for throat soothing. No mint or menthol, no chocolate. Follow up with Dr. Chase Caller or Eric Form, NP in  4 weeks Please contact office for sooner follow up if symptoms do not improve or worsen or seek emergency care

## 2016-10-20 NOTE — Assessment & Plan Note (Addendum)
Continued bronchitis Chest x-ray with no acute cardiopulmonary issues Noncompliant with medical regimen Refuses treatment with prednisone taper Plan Stop Breo as you had a bad response to it. CXR today. We will call you with results  IgE Serum Allergy profile. CBC We will add Mucinex 1200 mg once daily for chest congestion.  Take with full glass of water. Allegra every night  Add the Nasocort 2 sprays in each nostril once daily. Continue Acifex daily as you have been doing. Delsym every 12 hours for cough. Sips of water instead of throat clearing Sugar free hard candy for throat soothing. No mint or menthol, no chocolate. Consider CT chest if remains unresolved after current treatment Follow up with Dr. Chase Caller or Eric Form, NP in  4 weeks Please contact office for sooner follow up if symptoms do not improve or worsen or seek emergency care

## 2016-10-21 LAB — RESPIRATORY ALLERGY PROFILE REGION II ~~LOC~~
Allergen, C. Herbarum, M2: <0.1 kU/L
Allergen, Cedar tree, t12: <0.1 kU/L
Allergen, Cottonwood, t14: <0.1 kU/L
Allergen, D pternoyssinus,d7: <0.1 kU/L
Allergen, Mouse Urine Protein, e78: <0.1 kU/L
Allergen, Mulberry, t76: <0.1 kU/L
Allergen, P. notatum, m1: <0.1 kU/L
Aspergillus fumigatus, m3: <0.1 kU/L
Bermuda Grass: <0.1 kU/L
Common Ragweed: <0.1 kU/L
D. farinae: <0.1 kU/L
IGE (IMMUNOGLOBULIN E), SERUM: 87 kU/L (ref <115)
Johnson Grass: <0.1 kU/L
Pecan/Hickory Tree IgE: <0.1 kU/L
Sheep Sorrel IgE: <0.1 kU/L
Timothy Grass: <0.1 kU/L

## 2016-10-22 ENCOUNTER — Telehealth: Payer: Self-pay | Admitting: Acute Care

## 2016-10-22 ENCOUNTER — Other Ambulatory Visit: Payer: Self-pay | Admitting: Internal Medicine

## 2016-10-22 NOTE — Telephone Encounter (Signed)
lmtcb x1 for pt. 

## 2016-10-22 NOTE — Telephone Encounter (Signed)
Pt is calling for her xray and lab results.  SG please advise. thanks

## 2016-10-22 NOTE — Telephone Encounter (Signed)
Please let patient know chest x-ray showed no acute cardiopulmonary process. Lab work was normal. Thank you

## 2016-10-25 NOTE — Telephone Encounter (Signed)
Spoke with pt. She is aware of results. Nothing further was needed.  

## 2016-10-25 NOTE — Telephone Encounter (Signed)
Pt returning call to get test results.[name]

## 2016-10-26 ENCOUNTER — Other Ambulatory Visit: Payer: Self-pay

## 2016-10-26 MED ORDER — RABEPRAZOLE SODIUM 20 MG PO TBEC: DELAYED_RELEASE_TABLET | ORAL | 0 refills | Status: DC

## 2016-10-26 NOTE — Telephone Encounter (Signed)
May refill x 1 only, will need to make appt for further Rx

## 2016-10-26 NOTE — Telephone Encounter (Signed)
Pt was notified that a 30 day refill would be sent in one time. Pt needs an office visit for additional refills. Patient verbalized understanding.   Rx was sent to [facility] electronically for #30 with no RF.

## 2016-10-26 NOTE — Telephone Encounter (Signed)
Pt called stating that she needs a refill on stomach medications, rabeprazole. She was previously a patient of Dr. Nyoka Cowden but she decided not to continue with this office once he retired. Pt has been unable to obtain a new PCP for unknown reasons.   Pt was advised that since she has not been seen in office since march 2018, rx request would have to be routed to provider for approval, however, provider may deny medication.

## 2016-10-27 ENCOUNTER — Ambulatory Visit (INDEPENDENT_AMBULATORY_CARE_PROVIDER_SITE_OTHER): Payer: Medicare Other | Admitting: Vascular Surgery

## 2016-10-27 ENCOUNTER — Encounter: Payer: Self-pay | Admitting: Vascular Surgery

## 2016-10-27 ENCOUNTER — Ambulatory Visit (HOSPITAL_COMMUNITY)
Admission: RE | Admit: 2016-10-27 | Discharge: 2016-10-27 | Disposition: A | Discharge status: Home / Self Care | Payer: Medicare Other | Source: Ambulatory Visit | Attending: Vascular Surgery | Admitting: Vascular Surgery

## 2016-10-27 VITALS — BP 134/68 | HR 80 | Temp 97.2°F | Resp 16 | Ht 68.0 in | Wt 128.0 lb

## 2016-10-27 DIAGNOSIS — I714 Abdominal aortic aneurysm, without rupture, unspecified: Secondary | ICD-10-CM

## 2016-10-27 NOTE — Progress Notes (Signed)
Patient name: [name] MRN: [id number] DOB: 05-Jul-1932 Sex: female  REASON FOR VISIT:    Follow up of an abdominal aortic aneurysm.  HPI:   [name] is a pleasant 83 y.o. female who I last saw on 09/12/2015. Duplex scan on 08/29/2015 showed that the maximum diameter of the infrarenal aorta was 2.7 cm. The right common iliac artery measured 1.4 cm in maximum diameter. The left common iliac artery measured 1.7 cm in maximum diameter. We discussed the follow up ultrasound in 2 years however the patient was very concerned about her aneurysms and would feel more calm trouble-free follow this with an ultrasound in 1 year. She comes in for a 1 year follow up visit.  Since I saw her last, she denies any abdominal pain or back pain. She is not a smoker.  Her main complaint is she is very concerned about her varicose veins and hyperpigmentation in both lower extremities. She does describe some aching pain and swelling in her legs associated with prolonged standing and relieved somewhat with elevation. She is unaware of any previous history of DVT or phlebitis. She has some family history of vein problems.  Past Medical History:  Diagnosis Date  . Anxiety disorder   . Barrett's esophagus 04/2008  . Benign neoplasm of colon   . CHF (congestive heart failure) ([place])   . Chronic airway obstruction, not elsewhere classified   . Complication of anesthesia    slow to wake up  . Depressive disorder, not elsewhere classified   . Diverticulitis   . Dysthymic disorder   . Erosive gastritis 08/2004  . GERD (gastroesophageal reflux disease)   . Hair thinning   . HTN (hypertension)   . Hyperplastic polyps of stomach 06/2002  . Hypothyroidism   . ICD (implantable cardiac defibrillator) in place    BiV/ICD; s/p removal of ICD with insertion of BiV PPM 04/26/12  . Insomnia 08/20/2015  . Kyphosis 08/20/2015  . LBBB (left bundle branch block)   . Melanoma ([place])    right arm  . Mitral regurgitation    . Nonischemic cardiomyopathy ([place])    EF initially 20%; Last measurement up to 50% per echo in August of 2012  . Other and unspecified hyperlipidemia   . Other and unspecified hyperlipidemia   . Pacemaker 04/26/2012  . Pain in joint, lower leg   . Palpitations   . Restless legs syndrome (RLS)   . Sleep apnea   . Synovial cyst of popliteal space   . Unspecified chronic bronchitis ([place])   . Unspecified nasal polyp   . Unspecified sinusitis (chronic)     Family History  Problem Relation Age of Onset  . Stroke Father   . Heart disease Other        maternal side  . Colon cancer Paternal Uncle   . Breast cancer Maternal Aunt   . Diabetes Neg Hx     SOCIAL HISTORY: Social History  Substance Use Topics  . Smoking status: Former Smoker    Packs/day: 1.00    Years: 15.00    Types: Cigarettes    Quit date: 12/22/1993  . Smokeless tobacco: Never Used     Comment: Does'nt reall year quit   . Alcohol use No    Allergies  Allergen Reactions  . Hydrocodone Itching  . Cephalexin Itching and Swelling  . Ciprofloxacin     Not indicated due to aneurysm in aorta    . Doxycycline Other (See Comments)  Per pt: unknown  . Escitalopram     Dry mouth  . Levofloxacin     Not indicated due to aneurysm in aorta   . Moxifloxacin     Not indicated due to aneurysm in aorta   . Ofloxacin Other (See Comments)    Per pt: unknown  . Sertraline     insomnia  . Tape Other (See Comments)    Burns skin.   . Latex Rash    "If pt. Makes contact with or wearing"  . Neomycin Rash    Current Outpatient Prescriptions  Medication Sig Dispense Refill  . acetaminophen (TYLENOL) 500 MG tablet Take 500 mg by mouth every 6 (six) hours as needed for pain. For pain    . ALPRAZolam (XANAX) 0.25 MG tablet TAKE 1 TABLET BY MOUTH UP TO TWICE A DAY IF NEEDED FOR ANXIETY OR SLEEP 60 tablet 0  . Ascorbic Acid (VITAMIN C) 1000 MG tablet Take 1,000 mg by mouth daily.    . Biotin 5000 MCG TABS Take 1 tablet  by mouth daily.    . Calcium Carbonate-Vitamin D (CALCIUM + D PO) Take 2 tablets by mouth daily.     . Cyanocobalamin (VITAMIN B 12 PO) Take 1 tablet by mouth daily. Vitamin B12    . doxepin (SINEQUAN) 25 MG capsule TAKE 1 CAPSULE BY MOUTH NIGHTLY TO HELP NERVES AND SLEEP 60 capsule 3  . ESTRACE VAGINAL 0.1 MG/GM vaginal cream Place 1 g vaginally 2 (two) times a week.     . furosemide (LASIX) 20 MG tablet Take 1 tablet (20 mg total) by mouth daily as needed for fluid or edema. 30 tablet 6  . hydroxypropyl methylcellulose (ISOPTO TEARS) 2.5 % ophthalmic solution Place 1 drop into both eyes 2 (two) times daily as needed for dry eyes.    Marland Kitchen levothyroxine (SYNTHROID, LEVOTHROID) 112 MCG tablet TAKE 1 TABLET BY MOUTH EVERY DAY 30 MINUTES BEFORE BREAKFAST FOR THYROID 90 tablet 0  . Magnesium 250 MG TABS Take 1 tablet by mouth daily.     . Multiple Vitamin (MULTIVITAMIN WITH MINERALS) TABS Take 1 tablet by mouth daily.    . potassium chloride SA (K-DUR,KLOR-CON) 20 MEQ tablet Take 1 tablet (20 mEq total) by mouth daily as needed (only with lasix). 30 tablet 6  . RABEprazole (ACIPHEX) 20 MG tablet TAKE 1 TABLET ONCE DAILY   FOR STOMACH *LUPIN MFR* 30 tablet 0  . trandolapril (MAVIK) 2 MG tablet Take 2 mg twice a day 180 tablet 3  . XARELTO 20 MG TABS tablet TAKE 1 TABLET DAILY WITH   SUPPER 90 tablet 1  . fluticasone furoate-vilanterol (BREO ELLIPTA) 100-25 MCG/INH AEPB Inhale 1 puff into the lungs daily. (Patient not taking: Reported on 10/27/2016) 2 each 0   No current facility-administered medications for this visit.     REVIEW OF SYSTEMS:  [X]  denotes positive finding, [ ]  denotes negative finding Cardiac  Comments:  Chest pain or chest pressure:    Shortness of breath upon exertion:    Short of breath when lying flat:    Irregular heart rhythm:        Vascular    Pain in calf, thigh, or hip brought on by ambulation:    Pain in feet at night that wakes you up from your sleep:     Blood clot  in your veins:    Leg swelling:         Pulmonary    Oxygen at home:  Productive cough:     Wheezing:         Neurologic    Sudden weakness in arms or legs:     Sudden numbness in arms or legs:     Sudden onset of difficulty speaking or slurred speech:    Temporary loss of vision in one eye:     Problems with dizziness:         Gastrointestinal    Blood in stool:     Vomited blood:         Genitourinary    Burning when urinating:     Blood in urine:        Psychiatric    Major depression:         Hematologic    Bleeding problems:    Problems with blood clotting too easily:        Skin    Rashes or ulcers:        Constitutional    Fever or chills:     PHYSICAL EXAM:   Vitals:   10/27/16 1221  BP: 134/68  Pulse: 80  Resp: 16  Temp: (!) 97.2 F (36.2 C)  SpO2: 97%  Weight: 128 lb (58.1 kg)  Height: 5\' 8"  (1.727 m)    GENERAL: The patient is a well-nourished female, in no acute distress. The vital signs are documented above. CARDIAC: There is a regular rate and rhythm.  VASCULAR: I do not detect carotid bruits. She has palpable femoral and pedal pulses bilaterally. She has varicose veins of both lower extremities. She has hyperpigmentation bilaterally consistent with chronic venous insufficiency. She has no significant lower extremity swelling. PULMONARY: There is good air exchange bilaterally without wheezing or rales. ABDOMEN: Soft and non-tender with normal pitched bowel sounds. Her aorta is palpable and nontender. MUSCULOSKELETAL: There are no major deformities or cyanosis. NEUROLOGIC: No focal weakness or paresthesias are detected. SKIN: There are no ulcers or rashes noted. PSYCHIATRIC: The patient has a normal affect.  DATA:    DUPLEX ABDOMINAL AORTA: I have independently interpreted the duplex of the abdominal aorta. This shows that the maximum diameter of the distal aorta is 2.4 cm. The right common iliac artery measures 1.4 cm in maximum  diameter the left common iliac artery measures 1.6 cm in maximum diameter. Thus these are stable in size.  MEDICAL ISSUES:   ABDOMINAL AORTIC ANEURYSM: This patient has a very small abdominal aortic aneurysm. The size on today's study was less than the study a year ago and this may be secondary to probe angulation. However regardless, there has been no increase in the size of the abdominal aorta or in the ectatic common iliac arteries. I recommended a follow up ultrasound in 2 years however again she is very anxious about this and would like to be seen in one year which I think is reasonable. I have ordered a duplex can for that time. Fortunately, she is not a smoker.  CHRONIC VENOUS INSUFFICIENCY: Based on her exam and history she does have evidence of chronic venous insufficiency. We have discussed the importance of intermittent leg elevation in the proper positioning for this. In addition I have given her a prescription for knee-high compression stockings with a gradient of 15-20 mmHg. I have encouraged her to avoid prolonged sitting and standing. I have encouraged her to exercise as much as possible and to consider water aerobics. If her varicose veins or symptoms progressed and certainly she could be considered for formal venous reflux testing.  Deitra Mayo [facility] [phone]

## 2016-10-28 NOTE — Addendum Note (Signed)
Addended by: Lianne Cure A on: 10/28/2016 04:41 PM   Modules accepted: Orders

## 2016-11-17 ENCOUNTER — Ambulatory Visit (INDEPENDENT_AMBULATORY_CARE_PROVIDER_SITE_OTHER): Payer: Medicare Other | Admitting: Acute Care

## 2016-11-17 ENCOUNTER — Encounter: Payer: Self-pay | Admitting: Acute Care

## 2016-11-17 DIAGNOSIS — J42 Unspecified chronic bronchitis: Secondary | ICD-10-CM | POA: Diagnosis not present

## 2016-11-17 NOTE — Progress Notes (Signed)
History of Present Illness [name] is a 83 y.o. female former smoker with chronic cough, emphysema, and  pulmonary nodules per CT scan.. She is followed by Dr. Chase Caller.   11/17/2016 2 week follow up: Pt. Presents for follow up. She was seen 10/20/2016 for cough. Plan of care after that visit was:  Serum allergy profile CBC We will add Mucinex 1200 mg once daily for chest congestion.  Take with full glass of water. Allegra every night  Add the Nasocort 2 sprays in each nostril once daily. Continue Acifex daily as you have been doing. Delsym every 12 hours for cough. Sips of water instead of throat clearing Sugar free hard candy for throat soothing. No mint or menthol, no chocolate. Consider CT Chest if cough does not resolve.  Pt. States she has been doing better. She states she only coughs in the morning now. This is a big improvement.She states she does still have some mucus production. Mucus is white.She states she feels she has some post nasal drip.She is not using the Allegra daily as she was instructed to do.She is compliant with her Nasacort daily. She is compliant with her Mucinex " mostly every day". She states she does have some itching that she notices is worse when she is not taking her Allegra every day. She states she does not like taking medication. She states this is not a financial issue.She states she has some nasal irritation, so we will have her start nasal saline    Test Results:  Chest x-ray 10/20/2016 IMPRESSION: No acute abnormality noted.  HRCT 09/07/2016 No evidence of interstitial lung disease. No acute pulmonary disease. 2. Mild centrilobular emphysema with mild diffuse bronchial wall thickening, suggesting COPD. 3. Tiny scattered pulmonary nodules are all stable since 2010 and considered benign . 4. Left main and 3 vessel coronary atherosclerosis.  CBC Latest Ref Rng & Units 10/20/2016 09/03/2016 07/07/2016  WBC 4.0 - 10.5 K/uL 7.4 7.3 6.3    Hemoglobin 12.0 - 15.0 g/dL 12.7 12.9 13.1  Hematocrit 36.0 - 46.0 % 38.7 38.2 38.9  Platelets 150.0 - 400.0 K/uL 213.0 204.0 206    BMP Latest Ref Rng & Units 07/07/2016 11/03/2015 08/18/2015  Glucose 65 - 99 mg/dL 77 81 81  BUN 7 - 25 mg/dL 19 17 18   Creatinine 0.60 - 0.88 mg/dL 0.83 0.68 0.76  BUN/Creat Ratio 12 - 28 - - 24  Sodium 135 - 146 mmol/L 141 138 137  Potassium 3.5 - 5.3 mmol/L 4.6 4.8 4.4  Chloride 98 - 110 mmol/L 107 104 98  CO2 20 - 31 mmol/L 26 28 23   Calcium 8.6 - 10.4 mg/dL 9.4 9.2 9.3    ProBNP    Component Value Date/Time   PROBNP 111.0 (H) 06/08/2011 1440    PFT    Component Value Date/Time   FEV1PRE 2.48 09/15/2016 1155   FEV1POST 2.67 09/15/2016 1155   FVCPRE 3.63 09/15/2016 1155   FVCPOST 3.63 09/15/2016 1155   PREFEV1FVCRT 68 09/15/2016 1155   PSTFEV1FVCRT 74 09/15/2016 1155    Dg Chest 2 View  Result Date: 10/20/2016 CLINICAL DATA:  Chronic cough EXAM: CHEST  2 VIEW COMPARISON:  09/07/2016 FINDINGS: Cardiac shadow is within normal limits. Aortic calcifications are seen. Defibrillator is noted. The lungs are well aerated bilaterally without focal infiltrate. No sizable parenchymal nodule is seen. Postsurgical changes in the left axilla are noted. No acute bony abnormality is seen. IMPRESSION: No acute abnormality noted. Aortic Atherosclerosis (ICD10-170.0) Electronically Signed  By: Inez Catalina M.D.   On: 10/20/2016 17:19     Past medical hx Past Medical History:  Diagnosis Date  . Anxiety disorder   . Barrett's esophagus 04/2008  . Benign neoplasm of colon   . CHF (congestive heart failure) ([place])   . Chronic airway obstruction, not elsewhere classified   . Complication of anesthesia    slow to wake up  . Depressive disorder, not elsewhere classified   . Diverticulitis   . Dysthymic disorder   . Erosive gastritis 08/2004  . GERD (gastroesophageal reflux disease)   . Hair thinning   . HTN (hypertension)   . Hyperplastic polyps of stomach  06/2002  . Hypothyroidism   . ICD (implantable cardiac defibrillator) in place    BiV/ICD; s/p removal of ICD with insertion of BiV PPM 04/26/12  . Insomnia 08/20/2015  . Kyphosis 08/20/2015  . LBBB (left bundle branch block)   . Melanoma ([place])    right arm  . Mitral regurgitation   . Nonischemic cardiomyopathy ([place])    EF initially 20%; Last measurement up to 50% per echo in August of 2012  . Other and unspecified hyperlipidemia   . Other and unspecified hyperlipidemia   . Pacemaker 04/26/2012  . Pain in joint, lower leg   . Palpitations   . Restless legs syndrome (RLS)   . Sleep apnea   . Synovial cyst of popliteal space   . Unspecified chronic bronchitis ([place])   . Unspecified nasal polyp   . Unspecified sinusitis (chronic)      Social History  Substance Use Topics  . Smoking status: Former Smoker    Packs/day: 1.00    Years: 15.00    Types: Cigarettes    Quit date: 12/22/1993  . Smokeless tobacco: Never Used     Comment: Does'nt reall year quit   . Alcohol use No    Ms.Lampley reports that she quit smoking about 22 years ago. Her smoking use included Cigarettes. She has a 15.00 pack-year smoking history. She has never used smokeless tobacco. She reports that she does not drink alcohol or use drugs.  Tobacco Cessation: Former smoker  Past surgical hx, Family hx, Social hx all reviewed.  Current Outpatient Prescriptions on File Prior to Visit  Medication Sig  . acetaminophen (TYLENOL) 500 MG tablet Take 500 mg by mouth every 6 (six) hours as needed for pain. For pain  . Ascorbic Acid (VITAMIN C) 1000 MG tablet Take 1,000 mg by mouth daily.  . Biotin 5000 MCG TABS Take 1 tablet by mouth daily.  . Calcium Carbonate-Vitamin D (CALCIUM + D PO) Take 2 tablets by mouth daily.   . Cyanocobalamin (VITAMIN B 12 PO) Take 1 tablet by mouth daily. Vitamin B12  . doxepin (SINEQUAN) 25 MG capsule TAKE 1 CAPSULE BY MOUTH NIGHTLY TO HELP NERVES AND SLEEP  . ESTRACE VAGINAL 0.1 MG/GM  vaginal cream Place 1 g vaginally 2 (two) times a week.   . furosemide (LASIX) 20 MG tablet Take 1 tablet (20 mg total) by mouth daily as needed for fluid or edema.  . hydroxypropyl methylcellulose (ISOPTO TEARS) 2.5 % ophthalmic solution Place 1 drop into both eyes 2 (two) times daily as needed for dry eyes.  Marland Kitchen levothyroxine (SYNTHROID, LEVOTHROID) 112 MCG tablet TAKE 1 TABLET BY MOUTH EVERY DAY 30 MINUTES BEFORE BREAKFAST FOR THYROID  . Magnesium 250 MG TABS Take 1 tablet by mouth daily.   . Multiple Vitamin (MULTIVITAMIN WITH MINERALS) TABS Take 1 tablet by  mouth daily.  . potassium chloride SA (K-DUR,KLOR-CON) 20 MEQ tablet Take 1 tablet (20 mEq total) by mouth daily as needed (only with lasix).  . RABEprazole (ACIPHEX) 20 MG tablet TAKE 1 TABLET ONCE DAILY   FOR STOMACH *LUPIN MFR*  . trandolapril (MAVIK) 2 MG tablet Take 2 mg twice a day  . XARELTO 20 MG TABS tablet TAKE 1 TABLET DAILY WITH   SUPPER  . [DISCONTINUED] omeprazole (PRILOSEC) 20 MG capsule Take 1 capsule (20 mg total) by mouth daily.   No current facility-administered medications on file prior to visit.      Allergies  Allergen Reactions  . Hydrocodone Itching  . Cephalexin Itching and Swelling  . Ciprofloxacin     Not indicated due to aneurysm in aorta    . Doxycycline Other (See Comments)    Per pt: unknown  . Escitalopram     Dry mouth  . Levofloxacin     Not indicated due to aneurysm in aorta   . Moxifloxacin     Not indicated due to aneurysm in aorta   . Ofloxacin Other (See Comments)    Per pt: unknown  . Sertraline     insomnia  . Tape Other (See Comments)    Burns skin.   . Latex Rash    "If pt. Makes contact with or wearing"  . Neomycin Rash    Review Of Systems:  Constitutional:   No  weight loss, night sweats,  Fevers, chills, fatigue, or  lassitude.  HEENT:   No headaches,  Difficulty swallowing,  Tooth/dental problems, or  Sore throat,                No sneezing, itching, ear ache, nasal  congestion, + post nasal drip,   CV:  No chest pain,  Orthopnea, PND, swelling in lower extremities, anasarca, dizziness, palpitations, syncope.   GI  No heartburn, indigestion, abdominal pain, nausea, vomiting, diarrhea, change in bowel habits, loss of appetite, bloody stools.   Resp: No shortness of breath with exertion or at rest.  + excess mucus, rare productive cough,  + non-productive cough,  No coughing up of blood.  No change in color of mucus.  No wheezing.  No chest wall deformity  Skin: no rash or lesions.  GU: no dysuria, change in color of urine, no urgency or frequency.  No flank pain, no hematuria   MS:  No joint pain or swelling.  No decreased range of motion.  No back pain.  Psych:  No change in mood or affect. No depression or anxiety.  No memory loss.   Vital Signs BP 116/68 (BP Location: Left Arm, Cuff Size: Normal)   Pulse (!) 55   Ht 5' 8.5" (1.74 m)   Wt 133 lb 2 oz (60.4 kg)   SpO2 98%   BMI 19.95 kg/m    Physical Exam:  General- No distress,  A&Ox3, pleasant ENT: No sinus tenderness, TM clear, pale nasal mucosa, no oral exudate,no post nasal drip, no LAN Cardiac: S1, S2, regular rate and rhythm, no murmur Chest: No wheeze/ rales/ dullness; no accessory muscle use, no nasal flaring, no sternal retractions Abd.: Soft Non-tender,non-distended Ext: No clubbing cyanosis, edema Neuro:  normal strength Skin: No rashes, warm and dry Psych: normal mood and behavior   Assessment/Plan  Chronic bronchitis (HCC) Significantly improved since last visit Plan Continue Mucinex 1200 mg once daily for chest congestion.  Take with full glass of water. Take Allegra every night  Add the  Nasocort 2 sprays in each nostril once daily. Continue Acifex daily as you have been doing. Robitussin cough syrup as needed for cough. Sips of water instead of throat clearing Sugar free hard candy for throat soothing. No mint or menthol, no chocolate. If you decide you would  like to try a flutter valve let us know. Follow up as needed with pulmonary. Please establish yourself with a PCP. Please contact office for sooner follow up if symptoms do not improve or worsen or seek emergency care       Magdalen Spatz, NP 11/17/2016  5:17 PM

## 2016-11-17 NOTE — Assessment & Plan Note (Signed)
Significantly improved since last visit Plan Continue Mucinex 1200 mg once daily for chest congestion.  Take with full glass of water. Take Allegra every night  Add the Nasocort 2 sprays in each nostril once daily. Continue Acifex daily as you have been doing. Robitussin cough syrup as needed for cough. Sips of water instead of throat clearing Sugar free hard candy for throat soothing. No mint or menthol, no chocolate. If you decide you would like to try a flutter valve let us know. Follow up as needed with pulmonary. Please establish yourself with a PCP. Please contact office for sooner follow up if symptoms do not improve or worsen or seek emergency care

## 2016-11-17 NOTE — Patient Instructions (Addendum)
It is nice to see  you today. Continue Mucinex 1200 mg once daily for chest congestion.  Take with full glass of water. Take Allegra every night  Add the Nasocort 2 sprays in each nostril once daily. Continue Acifex daily as you have been doing. Robitussin cough syrup as needed for cough. Sips of water instead of throat clearing Sugar free hard candy for throat soothing. No mint or menthol, no chocolate. If you decide you would like to try a flutter valve let us know. Follow up as needed with pulmonary. Please establish yourself with a PCP. Please contact office for sooner follow up if symptoms do not improve or worsen or seek emergency care

## 2016-11-25 ENCOUNTER — Other Ambulatory Visit: Payer: Self-pay | Admitting: *Deleted

## 2016-11-25 MED ORDER — RABEPRAZOLE SODIUM 20 MG PO TBEC: DELAYED_RELEASE_TABLET | ORAL | 0 refills | Status: DC

## 2016-11-25 NOTE — Telephone Encounter (Signed)
Patient called and requested. Faxed.

## 2016-12-06 ENCOUNTER — Encounter: Payer: Self-pay | Admitting: Internal Medicine

## 2016-12-06 ENCOUNTER — Ambulatory Visit (INDEPENDENT_AMBULATORY_CARE_PROVIDER_SITE_OTHER): Payer: Medicare Other | Admitting: Internal Medicine

## 2016-12-06 VITALS — BP 128/70 | HR 54 | Temp 98.4°F | Wt 130.0 lb

## 2016-12-06 DIAGNOSIS — I723 Aneurysm of iliac artery: Secondary | ICD-10-CM

## 2016-12-06 DIAGNOSIS — F331 Major depressive disorder, recurrent, moderate: Secondary | ICD-10-CM | POA: Diagnosis not present

## 2016-12-06 DIAGNOSIS — R05 Cough: Secondary | ICD-10-CM

## 2016-12-06 DIAGNOSIS — G4700 Insomnia, unspecified: Secondary | ICD-10-CM

## 2016-12-06 DIAGNOSIS — E440 Moderate protein-calorie malnutrition: Secondary | ICD-10-CM

## 2016-12-06 DIAGNOSIS — E538 Deficiency of other specified B group vitamins: Secondary | ICD-10-CM

## 2016-12-06 DIAGNOSIS — E039 Hypothyroidism, unspecified: Secondary | ICD-10-CM | POA: Diagnosis not present

## 2016-12-06 DIAGNOSIS — I714 Abdominal aortic aneurysm, without rupture, unspecified: Secondary | ICD-10-CM

## 2016-12-06 DIAGNOSIS — I420 Dilated cardiomyopathy: Secondary | ICD-10-CM | POA: Diagnosis not present

## 2016-12-06 DIAGNOSIS — I10 Essential (primary) hypertension: Secondary | ICD-10-CM

## 2016-12-06 DIAGNOSIS — J441 Chronic obstructive pulmonary disease with (acute) exacerbation: Secondary | ICD-10-CM

## 2016-12-06 DIAGNOSIS — R053 Chronic cough: Secondary | ICD-10-CM

## 2016-12-06 DIAGNOSIS — E785 Hyperlipidemia, unspecified: Secondary | ICD-10-CM | POA: Diagnosis not present

## 2016-12-06 DIAGNOSIS — J42 Unspecified chronic bronchitis: Secondary | ICD-10-CM

## 2016-12-06 LAB — LIPID PANEL
Cholesterol: 161 mg/dL (ref <200)
HDL: 68 mg/dL (ref >50)
LDL Cholesterol (Calc): 76 mg/dL (calc)
Non-HDL Cholesterol (Calc): 93 mg/dL (calc) (ref <130)
Total CHOL/HDL Ratio: 2.4 (calc) (ref <5.0)
Triglycerides: 90 mg/dL (ref <150)

## 2016-12-06 LAB — CBC WITH DIFFERENTIAL/PLATELET
Basophils Absolute: 22 cells/uL (ref 0–200)
Basophils Relative: 0.4 %
Eosinophils Absolute: 270 cells/uL (ref 15–500)
Eosinophils Relative: 5 %
HCT: 37.4 % (ref 35.0–45.0)
Hemoglobin: 12.6 g/dL (ref 11.7–15.5)
Lymphs Abs: 1631 cells/uL (ref 850–3900)
MCH: 29.5 pg (ref 27.0–33.0)
MCHC: 33.7 g/dL (ref 32.0–36.0)
MCV: 87.6 fL (ref 80.0–100.0)
MPV: 10.2 fL (ref 7.5–12.5)
Monocytes Relative: 9.5 %
Neutro Abs: 2965 cells/uL (ref 1500–7800)
Neutrophils Relative %: 54.9 %
Platelets: 207 10*3/uL (ref 140–400)
RBC: 4.27 10*6/uL (ref 3.80–5.10)
RDW: 13 % (ref 11.0–15.0)
Total Lymphocyte: 30.2 %
WBC mixed population: 513 cells/uL (ref 200–950)
WBC: 5.4 10*3/uL (ref 3.8–10.8)

## 2016-12-06 LAB — COMPLETE METABOLIC PANEL WITH GFR
AG Ratio: 1.8 (calc) (ref 1.0–2.5)
ALT: 12 U/L (ref 6–29)
AST: 19 U/L (ref 10–35)
Albumin: 4.2 g/dL (ref 3.6–5.1)
Alkaline phosphatase (APISO): 79 U/L (ref 33–130)
BUN: 19 mg/dL (ref 7–25)
CO2: 28 mmol/L (ref 20–32)
Calcium: 9.5 mg/dL (ref 8.6–10.4)
Chloride: 105 mmol/L (ref 98–110)
Creat: 0.79 mg/dL (ref 0.60–0.88)
GFR, Est African American: 81 mL/min/{1.73_m2} (ref >=60)
GFR, Est Non African American: 70 mL/min/{1.73_m2} (ref >=60)
Globulin: 2.4 g/dL (calc) (ref 1.9–3.7)
Glucose, Bld: 90 mg/dL (ref 65–99)
Potassium: 4.7 mmol/L (ref 3.5–5.3)
Sodium: 138 mmol/L (ref 135–146)
Total Bilirubin: 0.5 mg/dL (ref 0.2–1.2)
Total Protein: 6.6 g/dL (ref 6.1–8.1)

## 2016-12-06 LAB — TSH: TSH: 0.6 mIU/L (ref 0.40–4.50)

## 2016-12-06 LAB — VITAMIN B12: Vitamin B-12: 865 pg/mL (ref 200–1100)

## 2016-12-06 LAB — MAGNESIUM: Magnesium: 2.2 mg/dL (ref 1.5–2.5)

## 2016-12-06 MED ORDER — TRAZODONE HCL 50 MG PO TABS: 25.0000 mg | ORAL_TABLET | Freq: Every day | ORAL | 5 refills | Status: DC

## 2016-12-06 MED ORDER — AMOXICILLIN-POT CLAVULANATE 875-125 MG PO TABS: 1.0000 | ORAL_TABLET | Freq: Two times a day (BID) | ORAL | 0 refills | Status: DC

## 2016-12-06 NOTE — Progress Notes (Signed)
Location:  [facility] clinic Provider:  Terrace Chiem L. Mariea Clonts, D.O., C.M.D.  Code Status: Full code--needs discussion Goals of Care:  Advanced Directives 12/06/2016  Does Patient Have a Medical Advance Directive? Yes  Type of Paramedic of [place];Living will  Does patient want to make changes to medical advance directive? -  Copy of [place] in Chart? No - copy requested  Would patient like information on creating a medical advance directive? -  Pre-existing out of facility DNR order (yellow form or pink MOST form) -     Chief Complaint  Patient presents with  . Medical Management of Chronic Issues    21mth follow-up, transfer from Dr. Nyoka Cowden    HPI: Patient is a 83 y.o. female seen today for medical management of chronic diseases and her first visit with me after Dr. Rolly Salter retirement.  She has an extensive medical history including AAA, iliac aneurysm, dilated cardiomyopathy, chronic systolic heart failure, HTN, PVCs, Raynaud's, chronic bronchitis, sinusitis, sleep apnea, Barrett's esophagus, gastitis, GERD, hypothyroidism, kyphosis, anxiety, depression, malnutrition, restless legs, insomnia, low back pain, hyperlipidemia and several symptoms listed on her problem list without true diagnoses, as well as UTIs which clearly are resolved at this point.    Says she is doing terrible.    Chronic bronchitis:  Follows with pulmonary, Dr. Chase Caller.  Had CT and CXR, but no signs of malignancy--has had cough since last December.  Used delsym w/o improvment.  Knows there was emphysema and allergy stuff.  She is on allegra, nasocort and mucinex with some improvement in the cough.  Last Sunday, she took a terrible cold with runny nose, congestion, coughing and still not better.  Has soreness in her chest.  Some of what she coughs up is yellowish or bloody.  Runs 96.5 at baseline, 98.4 here and said she's been feeling hot and sweaty a lot last week, and feels  miserable.  Can take amoxicillin, augmentin, bactrim, zpak.  Has already had two zpaks early in the cough.  quit smoking about 22 years ago. Her smoking use included Cigarettes. She has a 15.00 pack-year smoking history. She has never used smokeless tobacco.  Had her mammogram in June.    Had two operations for melanoma:  Had years of scans to f/u afterwards.  On left arm--so removed tumor itself and lymph nodes.    In 2014, had a hernia in her groin area.  After about a year, she went for surgery for the hernia.  A day or so later, there hernia was still there.  Was told it would be swelling and it would resolve.  There was a back and forth.  She was wating to get it taken care of, had incarceration and emergency surgery which was unsuccessful.  Third surgery finally fixed that.  Some months later another surgery was required with mesh.  Chronic systolic chf--initially was in 2006, in 2007, got defibrillator place, but EF improved over time and it got changed to just a pacemaker:  Last echo in 5/18 with EF of 50-55%.    Dr. Scot Dock follows her aneurysms:  She reports he has not been concerned--more of a widening of the aorta than a true aneurysm.    HTN:  BP is well controlled.  Had been up several months ago and Dr. [name] put her back on two lasix per day instead of one.  May have been eating too much salt.    Depression:  Due to multiple chronic conditions.  Partner passed away a couple of years ago.  Mentions TSH trending downward.  Almost out of levothyroxine.  Last check was in May.  Fatigue:  Gets so tired.  Admits part is depression.  Not taking doxepin.  She is not a fan of multiple pills.  Has tried lexapro which caused dry mouth.  Zoloft caused insomnia.  On the doxepin b/c it helps with sleep also.  Her partner had tried it with success.   Has not tried celexa or trazodone.    Has to get up to urinate frequently.  Hard to then go to sleep.  Had been taking xanax to help with sleep.   Stopped the xanax.  Has been to the urologist.  He did a cystoscopy which was unremarkable.  Nocturia is the main problem.  Urologist said she could take a pill for it.    Afib on xarelto.  Only had 2 hrs out of 6 mos of afib in that period of time while on monitor.  At first was given eliquis which made her itch.  Then started xarelto instead.  Was temporarily on warfarin, but Dr. [name] thought the reaction of continued itching from xarelto was due to the residual eliquis.    Malnutrition:  Has not been eating real healthy.  Eats veggies during the week.  Goes to [employer] for hamburgers.  Says she ate with her partner, Richard.  Does not like cooking.  Goes to [employer] some.  [employer] weekly for greek salads.  Says she's never been diagnosed with sleep apnea.  It's on her medical history.    Low back pain.  Has DDD.  In 2016, xrays showed extensive DDD.  She can't stand up straight for a prolonged time.  Says she can't bear being humped over like an old woman.  Went to an orthopedist and had an xray and got PT orders there.  Two days later was the whole afib mess with anticoagulants.  Never went to PT.    It's been over a year and she has not gone.    Past Medical History:  Diagnosis Date  . Anxiety disorder   . Barrett's esophagus 04/2008  . Benign neoplasm of colon   . CHF (congestive heart failure) ([place])   . Chronic airway obstruction, not elsewhere classified   . Complication of anesthesia    slow to wake up  . Depressive disorder, not elsewhere classified   . Diverticulitis   . Dysthymic disorder   . Erosive gastritis 08/2004  . GERD (gastroesophageal reflux disease)   . Hair thinning   . HTN (hypertension)   . Hyperplastic polyps of stomach 06/2002  . Hypothyroidism   . ICD (implantable cardiac defibrillator) in place    BiV/ICD; s/p removal of ICD with insertion of BiV PPM 04/26/12  . Insomnia 08/20/2015  . Kyphosis 08/20/2015  . LBBB (left bundle branch block)   . Melanoma  ([place])    right arm  . Mitral regurgitation   . Nonischemic cardiomyopathy ([place])    EF initially 20%; Last measurement up to 50% per echo in August of 2012  . Other and unspecified hyperlipidemia   . Other and unspecified hyperlipidemia   . Pacemaker 04/26/2012  . Pain in joint, lower leg   . Palpitations   . Restless legs syndrome (RLS)   . Sleep apnea   . Synovial cyst of popliteal space   . Unspecified chronic bronchitis ([place])   . Unspecified nasal polyp   . Unspecified sinusitis (  chronic)     Past Surgical History:  Procedure Laterality Date  . ABDOMINAL HYSTERECTOMY  1076   endometriosis  . APPENDECTOMY    . BI-VENTRICULAR PACEMAKER INSERTION N/A 04/26/2012   Procedure: BI-VENTRICULAR PACEMAKER INSERTION (CRT-P);  Surgeon: Evans Lance, MD;  [facility] C[facility] LAB;  Service: Cardiovascular;  Laterality: N/A;  . defibrillator insertion     s/p removal of previously implanted BiV ICD and insertion of a new BiV pacemaker on 04/26/12  . excise mole of lip  2001   Dr. Larena Sox  . excision of wen  1984   Dr. Parks Ranger  . FEMORAL HERNIA REPAIR  12/25/2012   Dr Dalbert Batman  . FEMORAL HERNIA REPAIR  01/04/2013   Recurrent - Dr. Lilyan Punt  . INCISIONAL HERNIA REPAIR N/A 10/01/2013   Procedure: LAPAROSCOPIC INCISIONAL HERNIA;  Surgeon: Gayland Curry, MD;  Location: [facility];  Service: General;  Laterality: N/A;  . INGUINAL HERNIA REPAIR Right 09/26/2012   Procedure: RIGHT INGUINAL HERNIA REPAIR WITH MESH;  Surgeon: Odis Hollingshead, MD;  Location: [place];  Service: General;  Laterality: Right;  . INGUINAL HERNIA REPAIR Right 12/25/2012   Procedure: explor right groin, small bowel rescection, tissue repair right femoral hernia;  Surgeon: Adin Hector, MD;  Location: [facility];  Service: General;  Laterality: Right;  . INSERTION OF MESH Right 09/26/2012   Procedure: INSERTION OF MESH;  Surgeon: Odis Hollingshead, MD;  Location: [place];  Service: General;  Laterality: Right;  . INSERTION OF MESH  N/A 10/01/2013   Procedure: INSERTION OF MESH;  Surgeon: Gayland Curry, MD;  Location: [facility];  Service: General;  Laterality: N/A;  . LAPAROSCOPY N/A 01/04/2013   Procedure: Diagnostic Laparoscopy, exploratory laparotomy with small bowel resection, closure of right femoral hernia repair;  Surgeon: Madilyn Hook, DO;  Location: [facility];  Service: General;  Laterality: N/A;  . Left arm surgery    . RIGHT BREAST LUMPECTOMY  1988   Dr. Marylene Buerger  . right knee surgery     arthroscopy: Dr. Shellia Carwin  . TONSILLECTOMY      Allergies  Allergen Reactions  . Hydrocodone Itching  . Cephalexin Itching and Swelling  . Ciprofloxacin     Not indicated due to aneurysm in aorta    . Doxycycline Other (See Comments)    Per pt: unknown  . Escitalopram     Dry mouth  . Levofloxacin     Not indicated due to aneurysm in aorta   . Moxifloxacin     Not indicated due to aneurysm in aorta   . Ofloxacin Other (See Comments)    Per pt: unknown  . Sertraline     insomnia  . Tape Other (See Comments)    Burns skin.   . Latex Rash    "If pt. Makes contact with or wearing"  . Neomycin Rash    Outpatient Encounter Prescriptions as of 12/06/2016  Medication Sig  . acetaminophen (TYLENOL) 500 MG tablet Take 500 mg by mouth every 6 (six) hours as needed for pain. For pain  . Ascorbic Acid (VITAMIN C) 1000 MG tablet Take 1,000 mg by mouth daily.  . Biotin 5000 MCG TABS Take 1 tablet by mouth daily.  . Calcium Carbonate-Vitamin D (CALCIUM + D PO) Take 2 tablets by mouth daily.   . Cyanocobalamin (VITAMIN B 12 PO) Take 1 tablet by mouth daily.   Marland Kitchen ESTRACE VAGINAL 0.1 MG/GM vaginal cream Place 1 g vaginally 2 (two) times a week.   Marland Kitchen  furosemide (LASIX) 20 MG tablet Take 1 tablet (20 mg total) by mouth daily as needed for fluid or edema.  . Magnesium 250 MG TABS Take 1 tablet by mouth daily.   . Multiple Vitamin (MULTIVITAMIN WITH MINERALS) TABS Take 1 tablet by mouth daily.  . potassium chloride SA  (K-DUR,KLOR-CON) 20 MEQ tablet Take 1 tablet (20 mEq total) by mouth daily as needed (only with lasix).  . RABEprazole (ACIPHEX) 20 MG tablet TAKE 1 TABLET ONCE DAILY   FOR STOMACH *LUPIN MFR*  . trandolapril (MAVIK) 2 MG tablet Take 2 mg twice a day  . XARELTO 20 MG TABS tablet TAKE 1 TABLET DAILY WITH   SUPPER  . [DISCONTINUED] doxepin (SINEQUAN) 25 MG capsule TAKE 1 CAPSULE BY MOUTH NIGHTLY TO HELP NERVES AND SLEEP  . [DISCONTINUED] levothyroxine (SYNTHROID, LEVOTHROID) 112 MCG tablet TAKE 1 TABLET BY MOUTH EVERY DAY 30 MINUTES BEFORE BREAKFAST FOR THYROID  . amoxicillin-clavulanate (AUGMENTIN) 875-125 MG tablet Take 1 tablet by mouth 2 (two) times daily.  . traZODone (DESYREL) 50 MG tablet Take 0.5 tablets (25 mg total) by mouth at bedtime.  . [DISCONTINUED] hydroxypropyl methylcellulose (ISOPTO TEARS) 2.5 % ophthalmic solution Place 1 drop into both eyes 2 (two) times daily as needed for dry eyes.   No facility-administered encounter medications on file as of 12/06/2016.     Review of Systems:  Review of Systems  Constitutional: Positive for malaise/fatigue and weight loss. Negative for chills and fever.  HENT: Positive for congestion.   Eyes: Negative for blurred vision.       Glasses  Respiratory: Positive for cough, sputum production, shortness of breath and wheezing. Negative for hemoptysis.   Cardiovascular: Negative for chest pain and palpitations.       Varicose veins--"really bad" to where she would not allow me to examine them or pull her pantlegs up  Gastrointestinal: Positive for heartburn. Negative for abdominal pain.  Genitourinary: Positive for frequency. Negative for dysuria.  Musculoskeletal: Positive for back pain. Negative for falls.       Restless legs  Skin: Negative for itching and rash.  Neurological: Negative for dizziness, loss of consciousness and weakness.  Endo/Heme/Allergies: Bruises/bleeds easily.  Psychiatric/Behavioral: Positive for depression. Negative  for memory loss. The patient is nervous/anxious.     Health Maintenance  Topic Date Due  . PNA vac Low Risk Adult (2 of 2 - PCV13) 03/13/2009  . INFLUENZA VACCINE  10/06/2016  . MAMMOGRAM  08/26/2017  . TETANUS/TDAP  03/15/2021  . DEXA SCAN  Completed    Physical Exam: Vitals:   12/06/16 1003  BP: 128/70  Pulse: (!) 54  Temp: 98.4 F (36.9 C)  TempSrc: Oral  SpO2: 97%  Weight: 130 lb (59 kg)   Body mass index is 19.48 kg/m. Physical Exam  Constitutional: She is oriented to person, place, and time.  Somewhat cachectic appearing white female   HENT:  Head: Normocephalic and atraumatic.  Right Ear: External ear normal.  Left Ear: External ear normal.  Nose: Nose normal.  Postnasal drip  Eyes: Pupils are equal, round, and reactive to light. EOM are normal.  Neck: Normal range of motion. Neck supple. No JVD present.  Cardiovascular: Normal rate, regular rhythm, normal heart sounds and intact distal pulses.   No murmur heard. Varicose veins of bilateral LEs  Pulmonary/Chest: Effort normal. She has wheezes.  And coarse rhonchi upper lung fields  Abdominal: Bowel sounds are normal.  Musculoskeletal: Normal range of motion.  Lymphadenopathy:    She  has no cervical adenopathy.  Neurological: She is alert and oriented to person, place, and time. No cranial nerve deficit.  Skin: Skin is warm and dry.  Psychiatric:  Down, tearful, expresses frustration with chronic medical illnesses    Labs reviewed: Basic Metabolic Panel:  Recent Labs  07/07/16 1054 12/06/16 1123  NA 141 138  K 4.6 4.7  CL 107 105  CO2 26 28  GLUCOSE 77 90  BUN 19 19  CREATININE 0.83 0.79  CALCIUM 9.4 9.5  MG  --  2.2  TSH 0.46 0.60   Liver Function Tests:  Recent Labs  07/07/16 1054 12/06/16 1123  AST 20 19  ALT 12 12  ALKPHOS 80  --   BILITOT 0.3 0.5  PROT 6.5 6.6  ALBUMIN 3.9  --    No results for input(s): LIPASE, AMYLASE in the last 8760 hours. No results for input(s):  AMMONIA in the last 8760 hours. CBC:  Recent Labs  09/03/16 1317 10/20/16 1654 12/06/16 1123  WBC 7.3 7.4 5.4  NEUTROABS 4.4 4.7 2,965  HGB 12.9 12.7 12.6  HCT 38.2 38.7 37.4  MCV 89.0 91.4 87.6  PLT 204.0 213.0 207   Lipid Panel:  Recent Labs  07/07/16 1054 12/06/16 1123  CHOL 162 161  HDL 65 68  LDLCALC 82  --   TRIG 75 90  CHOLHDL 2.5 2.4   Reviewed most recent cardiology and pulmonary notes, last echo 5/18, last EGD 2013, PFTs 7/18, pacer check 8/18  Assessment/Plan 1. COPD with acute exacerbation ([place]) - has productive cough with discolored sputum and had chills, possible fever within this two week period so will tx with abx, suggestive of bronchitis on exam, afebrile here so will hold off on CXR, is also using tylenol as needed for her pain - CBC with Differential/Platelet - amoxicillin-clavulanate (AUGMENTIN) 875-125 MG tablet; Take 1 tablet by mouth 2 (two) times daily.  Dispense: 20 tablet; Refill: 0 (also has sinus congestion)  2. Moderate malnutrition ([place]) -ongoing, appetite not great and also depressed--f/u labs for further info on vitamin/mineral levels, encouraged improved diet and nutritional supplements - TSH - Vitamin B12 - Magnesium - CBC with Differential/Platelet - COMPLETE METABOLIC PANEL WITH GFR  3. Insomnia, unspecified type -ongoing, discussed risks of traditional sedative/hypnotics with age--also seems she is high risk for becoming dependent with her tobacco abuses history and anxiety -will try he on trazodone 1/2 tab, but she may go up to a full tab if needed at hs--she should call back if this is inadequate -might consider remeron for her, but I was concerned she'd get too "hungover" from it the next day or not be able to get up for her day, also chose trazodone for low dose antidepressant benefit - traZODone (DESYREL) 50 MG tablet; Take 0.5 tablets (25 mg total) by mouth at bedtime.  Dispense: 15 tablet; Refill: 5  4. Hyperlipidemia,  unspecified hyperlipidemia type - not on medicatoin, has lost quite a bit of weight, f/u lab: - Lipid panel  5. Moderate episode of recurrent major depressive disorder ([place]) - stop doxepin due to [employer] list and potential side effects (pt has dry mouth and also takes many other meds)  -hoping to get two for one with trazodone for this, may need higher dose, but due to difficulty tolerating meds, start extra low with 1/2 tab - traZODone (DESYREL) 50 MG tablet; Take 0.5 tablets (25 mg total) by mouth at bedtime.  Dispense: 15 tablet; Refill: 5  6. Chronic cough -followed  by pulmonary, had COPD on PFTs (emphysema) and classic body habitus, is on an unusual ace inhibitor that could contribute (on for years) and is taking aciphex for GERD/gastritis/Barrett's -oddly not taking any inhalers whatsoever -pulmonary notes indicate she is on mucinex 1200mg  daily, allegra nightly and nasocort daily with delsym for cough but pt did not tell CMA this when meds reviewed--none are on the list though she is taking them -now has discolored sputum and seems to be having infection that I'll tx  7. B12 deficiency -continues on B12 daily, f/u lab  8. Hypothyroidism, unspecified type -cont levothyroxine 112 mcg daily, separate from other pills first thing in am  9. Chronic bronchitis, unspecified chronic bronchitis type ([place]) -not on tx, even albuterol inhaler, appears breo was tried at one point  10. AAA (abdominal aortic aneurysm) without rupture ([place]) -followed by vascular, Dr. Scot Dock, last AAA duplex was 10/27/16 with aortic aneurysm 2.7cm  11. Aneurysm of iliac artery ([facility]) -followed by vascular, Dr. Scot Dock, last duplex was 10/27/16 with left iliac a aneurysm 1.7cm  12. Cardiomyopathy, dilated, nonischemic ([facility]) -with chronic systolic chf per Dr. Tanna Furry note, has biv pacer/ICD, last interrogation 10/13/16  13. Essential hypertension -bp well controlled, is off metoprolol due to Raynaud's per  cardiology notes  Refused flu shot due to current infection--advised her to call when she recovers b/c it's important she get a flu shot especially with her COPD and cardiomyopathy  Labs/tests ordered:   Orders Placed This Encounter  Procedures  . TSH  . Vitamin B12  . Magnesium  . CBC with Differential/Platelet  . COMPLETE METABOLIC PANEL WITH GFR  . Lipid panel   Next appt:  12/27/2016 f/u depression, sleep, ?over copd exacerbation  Gehrig Patras L. Juniel Groene, D.O. [facility] [address], [place] [address] Cell Phone (Mon-Fri 8am-5pm):  [phone] On Call:  [phone] & follow prompts after 5pm & weekends Office Phone:  [phone] Office Fax:  [phone]

## 2016-12-07 ENCOUNTER — Other Ambulatory Visit: Payer: Self-pay | Admitting: *Deleted

## 2016-12-07 MED ORDER — LEVOTHYROXINE SODIUM 112 MCG PO TABS: ORAL_TABLET | ORAL | 0 refills | Status: DC

## 2016-12-08 ENCOUNTER — Other Ambulatory Visit: Payer: Self-pay | Admitting: Internal Medicine

## 2016-12-09 ENCOUNTER — Ambulatory Visit: Payer: Medicare Other | Admitting: Internal Medicine

## 2016-12-20 ENCOUNTER — Encounter: Payer: Self-pay | Admitting: Acute Care

## 2016-12-20 ENCOUNTER — Ambulatory Visit (INDEPENDENT_AMBULATORY_CARE_PROVIDER_SITE_OTHER)
Admission: RE | Admit: 2016-12-20 | Discharge: 2016-12-20 | Disposition: A | Discharge status: Home / Self Care | Payer: Medicare Other | Source: Ambulatory Visit | Attending: Acute Care | Admitting: Acute Care

## 2016-12-20 ENCOUNTER — Ambulatory Visit (INDEPENDENT_AMBULATORY_CARE_PROVIDER_SITE_OTHER): Payer: Medicare Other | Admitting: Acute Care

## 2016-12-20 VITALS — BP 120/70 | HR 57 | Ht 68.0 in | Wt 130.2 lb

## 2016-12-20 DIAGNOSIS — R05 Cough: Secondary | ICD-10-CM

## 2016-12-20 DIAGNOSIS — J42 Unspecified chronic bronchitis: Secondary | ICD-10-CM | POA: Diagnosis not present

## 2016-12-20 DIAGNOSIS — R053 Chronic cough: Secondary | ICD-10-CM

## 2016-12-20 MED ORDER — PREDNISONE 10 MG PO TABS: ORAL_TABLET | ORAL | 0 refills | Status: DC

## 2016-12-20 MED ORDER — SULFAMETHOXAZOLE-TRIMETHOPRIM 400-80 MG PO TABS: 1.0000 | ORAL_TABLET | Freq: Two times a day (BID) | ORAL | 0 refills | Status: DC

## 2016-12-20 NOTE — Assessment & Plan Note (Signed)
Was improving until pt. Caught a cold 9/23-9/24 Fever Plan: We will do a CXR today.( Stat) We will call you with results Prednisone taper; 10 mg tablets: 4 tabs x 2 days, 3 tabs x 2 days, 2 tabs x 2 days 1 tab x 2 days then stop. Delsym or Robitussin for cough every 12 hours. Bactrim 400/80 every 12 hours x 1 week. Take probiotic with antibiotic Follow up in 2 weeks with  Dr. Chase Caller or Judson Roch NP. Please contact office for sooner follow up if symptoms do not improve or worsen or seek emergency care   Consider repeat CT chest if no better in 2 weeks.

## 2016-12-20 NOTE — Patient Instructions (Addendum)
It is good to see you today. We will do a CXR today.( Stat) We will call you with results Prednisone taper; 10 mg tablets: 4 tabs x 2 days, 3 tabs x 2 days, 2 tabs x 2 days 1 tab x 2 days then stop. Delsym or Robitussin for cough every 12 hours. Bactrim 400/80 every 12 hours x 1 week. Take probiotic with antibiotic Follow up in 2 weeks with  Dr. Chase Caller or Judson Roch NP. Please contact office for sooner follow up if symptoms do not improve or worsen or seek emergency care   Consider repeat CT chest if no better in 2 week.

## 2016-12-20 NOTE — Progress Notes (Signed)
History of Present Illness [name] is a 83 y.o. female former smoker with chronic cough, emphysema, and  pulmonary nodules per CT scan.. She is followed by Dr. Chase Caller.   12/20/2016 Acute OV: Pt. Presents for acute visit with cough.She states she has been doing better with her cough. Then on 9/23 or 9/24 she developed a terrible cold. She had copious secretions. She saw her new Dr. Mariea Clonts on 12/06/2016. She was treated with Augmentin at that time. She is here because she has been having continued blood tinged secretions. She states she has been having bloody secretions x at least 1 week. Not every time she coughs, but at least some every day.She states her cough had been improving until 9/23. She states her chest tightness is better. She feels warm to the  today in the office. She is coughing in the office today. When she is not coughing up bloody secretions she is coughing up brown to murky secretions. She states she does not like to take drugs. She looks them up on [employer] and if she does not like the side effects she does not take them, and lists them as an allergy. We have significant limitations in treatment options  due to the medications she will agree to take.   Test Results: CXR: 12/20/2016: No acute findings.  Chest x-ray 10/20/2016 IMPRESSION: No acute abnormality noted.  HRCT 09/07/2016 No evidence of interstitial lung disease. No acute pulmonary disease. 2. Mild centrilobular emphysema with mild diffuse bronchial wall thickening, suggesting COPD. 3. Tiny scattered pulmonary nodules are all stable since 2010 and considered benign . 4. Left main and 3 vessel coronary atherosclerosis.  CBC Latest Ref Rng & Units 12/06/2016 10/20/2016 09/03/2016  WBC 3.8 - 10.8 Thousand/uL 5.4 7.4 7.3  Hemoglobin 11.7 - 15.5 g/dL 12.6 12.7 12.9  Hematocrit 35.0 - 45.0 % 37.4 38.7 38.2  Platelets 140 - 400 Thousand/uL 207 213.0 204.0    BMP Latest Ref Rng & Units 12/06/2016 07/07/2016  11/03/2015  Glucose 65 - 99 mg/dL 90 77 81  BUN 7 - 25 mg/dL 19 19 17   Creatinine 0.60 - 0.88 mg/dL 0.79 0.83 0.68  BUN/Creat Ratio 6 - 22 (calc) NOT APPLICABLE - -  Sodium 350 - 146 mmol/L 138 141 138  Potassium 3.5 - 5.3 mmol/L 4.7 4.6 4.8  Chloride 98 - 110 mmol/L 105 107 104  CO2 20 - 32 mmol/L 28 26 28   Calcium 8.6 - 10.4 mg/dL 9.5 9.4 9.2    BNP No results found for: BNP  ProBNP    Component Value Date/Time   PROBNP 111.0 (H) 06/08/2011 1440    PFT    Component Value Date/Time   FEV1PRE 2.48 09/15/2016 1155   FEV1POST 2.67 09/15/2016 1155   FVCPRE 3.63 09/15/2016 1155   FVCPOST 3.63 09/15/2016 1155   PREFEV1FVCRT 68 09/15/2016 1155   PSTFEV1FVCRT 74 09/15/2016 1155    Dg Chest 2 View  Result Date: 12/20/2016 CLINICAL DATA:  Cough and congestion for 3 weeks. Low-grade fever and hemoptysis. EXAM: CHEST  2 VIEW COMPARISON:  10/20/2016 and CT chest 09/07/2016. FINDINGS: Trachea is midline. Heart size normal. Thoracic aorta is calcified. Pacemaker and ICD lead tips are stable in position. Minimal biapical pleural thickening. Lungs are somewhat hyperinflated but clear. No pleural fluid. IMPRESSION: No acute findings. Electronically Signed   By: Lorin Picket M.D.   On: 12/20/2016 12:34     Past medical hx Past Medical History:  Diagnosis Date  . Anxiety disorder   .  Barrett's esophagus 04/2008  . Benign neoplasm of colon   . CHF (congestive heart failure) ([place])   . Chronic airway obstruction, not elsewhere classified   . Complication of anesthesia    slow to wake up  . Depressive disorder, not elsewhere classified   . Diverticulitis   . Dysthymic disorder   . Erosive gastritis 08/2004  . GERD (gastroesophageal reflux disease)   . Hair thinning   . HTN (hypertension)   . Hyperplastic polyps of stomach 06/2002  . Hypothyroidism   . ICD (implantable cardiac defibrillator) in place    BiV/ICD; s/p removal of ICD with insertion of BiV PPM 04/26/12  . Insomnia  08/20/2015  . Kyphosis 08/20/2015  . LBBB (left bundle branch block)   . Melanoma ([place])    right arm  . Mitral regurgitation   . Nonischemic cardiomyopathy ([place])    EF initially 20%; Last measurement up to 50% per echo in August of 2012  . Other and unspecified hyperlipidemia   . Other and unspecified hyperlipidemia   . Pacemaker 04/26/2012  . Pain in joint, lower leg   . Palpitations   . Restless legs syndrome (RLS)   . Sleep apnea   . Synovial cyst of popliteal space   . Unspecified chronic bronchitis ([place])   . Unspecified nasal polyp   . Unspecified sinusitis (chronic)      Social History  Substance Use Topics  . Smoking status: Former Smoker    Packs/day: 1.00    Years: 15.00    Types: Cigarettes    Quit date: 12/22/1993  . Smokeless tobacco: Never Used     Comment: Does'nt reall year quit   . Alcohol use No    [name] reports that she quit smoking about 23 years ago. Her smoking use included Cigarettes. She has a 15.00 pack-year smoking history. She has never used smokeless tobacco. She reports that she does not drink alcohol or use drugs.  Tobacco Cessation: Former smoker, quit 1995  Past surgical hx, Family hx, Social hx all reviewed.  Current Outpatient Prescriptions on File Prior to Visit  Medication Sig  . acetaminophen (TYLENOL) 500 MG tablet Take 500 mg by mouth every 6 (six) hours as needed for pain. For pain  . Ascorbic Acid (VITAMIN C) 1000 MG tablet Take 1,000 mg by mouth daily.  . Biotin 5000 MCG TABS Take 1 tablet by mouth daily.  . Calcium Carbonate-Vitamin D (CALCIUM + D PO) Take 2 tablets by mouth daily.   . Cyanocobalamin (VITAMIN B 12 PO) Take 1 tablet by mouth daily.   Marland Kitchen ESTRACE VAGINAL 0.1 MG/GM vaginal cream Place 1 g vaginally 2 (two) times a week.   . furosemide (LASIX) 20 MG tablet Take 1 tablet (20 mg total) by mouth daily as needed for fluid or edema.  Marland Kitchen levothyroxine (SYNTHROID, LEVOTHROID) 112 MCG tablet TAKE 1 TABLET BY MOUTH EVERY  DAY 30 MINUTES BEFORE BREAKFAST FOR THYROID  . Magnesium 250 MG TABS Take 1 tablet by mouth daily.   . Multiple Vitamin (MULTIVITAMIN WITH MINERALS) TABS Take 1 tablet by mouth daily.  . potassium chloride SA (K-DUR,KLOR-CON) 20 MEQ tablet Take 1 tablet (20 mEq total) by mouth daily as needed (only with lasix).  . RABEprazole (ACIPHEX) 20 MG tablet TAKE 1 TABLET ONCE DAILY   FOR STOMACH *LUPIN MFR*  . SYNTHROID 112 MCG tablet TAKE 1 TABLET BY MOUTH EVERY DAY 30 MINUTES BEFORE BREAKFAST FOR THYROID  . trandolapril (MAVIK) 2 MG tablet Take 2 mg  twice a day  . traZODone (DESYREL) 50 MG tablet Take 0.5 tablets (25 mg total) by mouth at bedtime.  Alveda Reasons 20 MG TABS tablet TAKE 1 TABLET DAILY WITH   SUPPER  . [DISCONTINUED] omeprazole (PRILOSEC) 20 MG capsule Take 1 capsule (20 mg total) by mouth daily.   No current facility-administered medications on file prior to visit.      Allergies  Allergen Reactions  . Hydrocodone Itching  . Cephalexin Itching and Swelling  . Ciprofloxacin     Not indicated due to aneurysm in aorta    . Doxycycline Other (See Comments)    Per pt: unknown  . Escitalopram     Dry mouth  . Levofloxacin     Not indicated due to aneurysm in aorta   . Moxifloxacin     Not indicated due to aneurysm in aorta   . Ofloxacin Other (See Comments)    Per pt: unknown  . Sertraline     insomnia  . Tape Other (See Comments)    Burns skin.   . Latex Rash    "If pt. Makes contact with or wearing"  . Neomycin Rash    Review Of Systems:  Constitutional:   No  weight loss, night sweats,  +Fevers, +chills, +fatigue, or  lassitude.  HEENT:   No headaches,  Difficulty swallowing,  Tooth/dental problems, or  Sore throat,                No sneezing, itching, ear ache, nasal congestion, post nasal drip,   CV:  No chest pain,  Orthopnea, PND, swelling in lower extremities, anasarca, dizziness, palpitations, syncope.   GI  No heartburn, indigestion, abdominal pain, nausea,  vomiting, diarrhea, change in bowel habits, loss of appetite, bloody stools.   Resp: no  shortness of breath with exertion or at rest.  + excess mucus, + productive cough,  No non-productive cough, + coughing up of scant blood.  No change in color of mucus.  No wheezing.  No chest wall deformity  Skin: no rash or lesions.  GU: no dysuria, change in color of urine, no urgency or frequency.  No flank pain, no hematuria   MS:  No joint pain or swelling.  No decreased range of motion.  No back pain.  Psych:  No change in mood or affect. No depression or anxiety.  No memory loss.   Vital Signs BP 120/70 (BP Location: Left Arm, Cuff Size: Normal)   Pulse (!) 57   Ht 5\' 8"  (1.727 m)   Wt 130 lb 3.2 oz (59.1 kg)   SpO2 100%   BMI 19.80 kg/m    Physical Exam:  General- No distress,  A&Ox3, pleasant ENT: No sinus tenderness, TM clear, pale nasal mucosa, no oral exudate,no post nasal drip, no LAN Cardiac: S1, S2, regular rate and rhythm, no murmur Chest: Scant  wheeze/ no rales/ rhonchi throughout; no accessory muscle use, no nasal flaring, no sternal retractions Abd.: Soft Non-tender, non-distended Ext: No clubbing cyanosis, edema Neuro:  normal strength, but deconditioned at baseline Skin: No rashes, warm and dry Psych: anxious   Assessment/Plan  Chronic bronchitis (HCC) Was improving until pt. Caught a cold 9/23-9/24 Fever Plan: We will do a CXR today.( Stat) We will call you with results Prednisone taper; 10 mg tablets: 4 tabs x 2 days, 3 tabs x 2 days, 2 tabs x 2 days 1 tab x 2 days then stop. Delsym or Robitussin for cough every 12 hours. Bactrim 400/80  every 12 hours x 1 week. Take probiotic with antibiotic Follow up in 2 weeks with  Dr. Chase Caller or Judson Roch NP. Please contact office for sooner follow up if symptoms do not improve or worsen or seek emergency care   Consider repeat CT chest if no better in 2 weeks.      Magdalen Spatz, NP 12/20/2016  1:45  PM

## 2016-12-25 NOTE — Progress Notes (Signed)
[name] Date of Birth: 1933/01/20 Medical Record #[id number]  History of Present Illness: [name] is seen today for follow up. She has multiple medical issues which include a cardiomyopathy, underlying BiV/ICD which resulted in improvement of her EF, LBBB, HTN, thyroid disease, melanoma, anxiety/depression and chronic systolic HF.   In September she was noted in device clinic to have paroxysmal Afib with controlled rate. She was started on Eliquis. [place] Vasc score of at least 4. She developed itching on Eliquis and switched to Xarelto. Itching did not go away so she was switched to Coumadin. After about a month the itching resolved.  She was later switched back to Xarelto without rash.   On follow up today she is doing "terrible". She is very depressed and tearful. Was given a Rx for Trazadone but hasn't started yet because she is concerned about side effects. Reports a persistent productive cough. Evaluated by pulmonary and seemed to be getting bette on nasonex, mucinex and Allegra but then she developed a bad chest cold one month ago and symtoms got worse. Has now completed a course of steroids and Bactrim and it is a little better.  No chest pain or SOB. No palpitations. No edema.    Current Outpatient Prescriptions  Medication Sig Dispense Refill  . acetaminophen (TYLENOL) 500 MG tablet Take 500 mg by mouth every 6 (six) hours as needed for pain. For pain    . Ascorbic Acid (VITAMIN C) 1000 MG tablet Take 1,000 mg by mouth daily.    . Biotin 5000 MCG TABS Take 1 tablet by mouth daily.    . Calcium Carbonate-Vitamin D (CALCIUM + D PO) Take 2 tablets by mouth daily.     . Cyanocobalamin (VITAMIN B 12 PO) Take 1 tablet by mouth daily.     Marland Kitchen ESTRACE VAGINAL 0.1 MG/GM vaginal cream Place 1 g vaginally 2 (two) times a week.     . furosemide (LASIX) 20 MG tablet Take 1 tablet (20 mg total) by mouth daily as needed for fluid or edema. 30 tablet 6  . levothyroxine (SYNTHROID, LEVOTHROID)  112 MCG tablet TAKE 1 TABLET BY MOUTH EVERY DAY 30 MINUTES BEFORE BREAKFAST FOR THYROID 90 tablet 0  . Magnesium 250 MG TABS Take 1 tablet by mouth daily.     . Multiple Vitamin (MULTIVITAMIN WITH MINERALS) TABS Take 1 tablet by mouth daily.    . potassium chloride SA (K-DUR,KLOR-CON) 20 MEQ tablet Take 1 tablet (20 mEq total) by mouth daily as needed (only with lasix). 30 tablet 6  . RABEprazole (ACIPHEX) 20 MG tablet TAKE 1 TABLET ONCE DAILY   FOR STOMACH *LUPIN MFR* 90 tablet 0  . traZODone (DESYREL) 50 MG tablet Take 0.5 tablets (25 mg total) by mouth at bedtime. 15 tablet 5  . XARELTO 20 MG TABS tablet TAKE 1 TABLET DAILY WITH   SUPPER 90 tablet 1  . losartan (COZAAR) 25 MG tablet Take 1 tablet (25 mg total) by mouth daily. 90 tablet 3   No current facility-administered medications for this visit.     Allergies  Allergen Reactions  . Hydrocodone Itching  . Cephalexin Itching and Swelling  . Ciprofloxacin     Not indicated due to aneurysm in aorta    . Doxycycline Other (See Comments)    Per pt: unknown  . Escitalopram     Dry mouth  . Levofloxacin     Not indicated due to aneurysm in aorta   . Moxifloxacin  Not indicated due to aneurysm in aorta   . Ofloxacin Other (See Comments)    Per pt: unknown  . Sertraline     insomnia  . Tape Other (See Comments)    Burns skin.   . Latex Rash    "If pt. Makes contact with or wearing"  . Neomycin Rash    Past Medical History:  Diagnosis Date  . Anxiety disorder   . Barrett's esophagus 04/2008  . Benign neoplasm of colon   . CHF (congestive heart failure) ([place])   . Chronic airway obstruction, not elsewhere classified   . Complication of anesthesia    slow to wake up  . Depressive disorder, not elsewhere classified   . Diverticulitis   . Dysthymic disorder   . Erosive gastritis 08/2004  . GERD (gastroesophageal reflux disease)   . Hair thinning   . HTN (hypertension)   . Hyperplastic polyps of stomach 06/2002  .  Hypothyroidism   . ICD (implantable cardiac defibrillator) in place    BiV/ICD; s/p removal of ICD with insertion of BiV PPM 04/26/12  . Insomnia 08/20/2015  . Kyphosis 08/20/2015  . LBBB (left bundle branch block)   . Melanoma ([place])    right arm  . Mitral regurgitation   . Nonischemic cardiomyopathy ([place])    EF initially 20%; Last measurement up to 50% per echo in August of 2012  . Other and unspecified hyperlipidemia   . Other and unspecified hyperlipidemia   . Pacemaker 04/26/2012  . Pain in joint, lower leg   . Palpitations   . Restless legs syndrome (RLS)   . Sleep apnea   . Synovial cyst of popliteal space   . Unspecified chronic bronchitis ([place])   . Unspecified nasal polyp   . Unspecified sinusitis (chronic)     Past Surgical History:  Procedure Laterality Date  . ABDOMINAL HYSTERECTOMY  1076   endometriosis  . APPENDECTOMY    . BI-VENTRICULAR PACEMAKER INSERTION N/A 04/26/2012   Procedure: BI-VENTRICULAR PACEMAKER INSERTION (CRT-P);  Surgeon: Evans Lance, MD;  Location: [facility] CATH LAB;  Service: Cardiovascular;  Laterality: N/A;  . defibrillator insertion     s/p removal of previously implanted BiV ICD and insertion of a new BiV pacemaker on 04/26/12  . excise mole of lip  2001   Dr. Larena Sox  . excision of wen  1984   Dr. Parks Ranger  . FEMORAL HERNIA REPAIR  12/25/2012   Dr Dalbert Batman  . FEMORAL HERNIA REPAIR  01/04/2013   Recurrent - Dr. Lilyan Punt  . INCISIONAL HERNIA REPAIR N/A 10/01/2013   Procedure: LAPAROSCOPIC INCISIONAL HERNIA;  Surgeon: Gayland Curry, MD;  Location: [facility];  Service: General;  Laterality: N/A;  . INGUINAL HERNIA REPAIR Right 09/26/2012   Procedure: RIGHT INGUINAL HERNIA REPAIR WITH MESH;  Surgeon: Odis Hollingshead, MD;  Location: [place];  Service: General;  Laterality: Right;  . INGUINAL HERNIA REPAIR Right 12/25/2012   Procedure: explor right groin, small bowel rescection, tissue repair right femoral hernia;  Surgeon: Adin Hector, MD;  Location:  [facility];  Service: General;  Laterality: Right;  . INSERTION OF MESH Right 09/26/2012   Procedure: INSERTION OF MESH;  Surgeon: Odis Hollingshead, MD;  Location: [place];  Service: General;  Laterality: Right;  . INSERTION OF MESH N/A 10/01/2013   Procedure: INSERTION OF MESH;  Surgeon: Gayland Curry, MD;  Location: [facility];  Service: General;  Laterality: N/A;  . LAPAROSCOPY N/A 01/04/2013   Procedure: Diagnostic Laparoscopy,  exploratory laparotomy with small bowel resection, closure of right femoral hernia repair;  Surgeon: Madilyn Hook, DO;  Location: [facility];  Service: General;  Laterality: N/A;  . Left arm surgery    . RIGHT BREAST LUMPECTOMY  1988   Dr. Marylene Buerger  . right knee surgery     arthroscopy: Dr. Shellia Carwin  . TONSILLECTOMY      History  Smoking Status  . Former Smoker  . Packs/day: 1.00  . Years: 15.00  . Types: Cigarettes  . Quit date: 12/22/1993  Smokeless Tobacco  . Never Used    Comment: Does'nt reall year quit     History  Alcohol Use No    Family History  Problem Relation Age of Onset  . Stroke Father   . Heart disease Other        maternal side  . Colon cancer Paternal Uncle   . Breast cancer Maternal Aunt   . Diabetes Neg Hx     Review of Systems: The review of systems is per the HPI.   All other systems were reviewed and are negative.  Physical Exam: BP 112/64   Pulse (!) 58   Ht 5\' 8"  (1.727 m)   Wt 131 lb (59.4 kg)   BMI 19.92 kg/m  GENERAL:  Elderly WF in NAD HEENT:  PERRL, EOMI, sclera are clear. Oropharynx is clear. NECK:  No jugular venous distention, carotid upstroke brisk and symmetric, no bruits, no thyromegaly or adenopathy LUNGS:  Clear to auscultation bilaterally CHEST:  Unremarkable HEART:  RRR,  PMI not displaced or sustained,S1 and S2 within normal limits, no S3, no S4: no clicks, no rubs, no murmurs ABD:  Soft, nontender. BS +, no masses or bruits. No hepatomegaly, no splenomegaly EXT:  2 + pulses throughout, no edema, no  cyanosis no clubbing SKIN:  Warm and dry.  No rashes NEURO:  Alert and oriented x 3. Cranial nerves II through XII intact. PSYCH:  Depressed and tearful.    Wt Readings from Last 3 Encounters:  12/29/16 131 lb (59.4 kg)  12/20/16 130 lb 3.2 oz (59.1 kg)  12/06/16 130 lb (59 kg)     LABORATORY DATA: Lab Results  Component Value Date   WBC 5.4 12/06/2016   HGB 12.6 12/06/2016   HCT 37.4 12/06/2016   PLT 207 12/06/2016   GLUCOSE 90 12/06/2016   CHOL 161 12/06/2016   TRIG 90 12/06/2016   HDL 68 12/06/2016   LDLCALC 82 07/07/2016   ALT 12 12/06/2016   AST 19 12/06/2016   NA 138 12/06/2016   K 4.7 12/06/2016   CL 105 12/06/2016   CREATININE 0.79 12/06/2016   BUN 19 12/06/2016   CO2 28 12/06/2016   TSH 0.60 12/06/2016   INR 2.1 01/26/2016    Last ICD check in August 2018 showed biv pacing 100 % of the time. No  Afib.  Echo: 07/22/16: Study Conclusions  - Left ventricle: The cavity size was mildly dilated. Wall   thickness was normal. Systolic function was normal. The estimated   ejection fraction was in the range of 50% to 55%. Wall motion was   normal; there were no regional wall motion abnormalities. - Aortic valve: There was trivial regurgitation. - Mitral valve: There was mild regurgitation. - Atrial septum: No defect or patent foramen ovale was identified.    Assessment / Plan: 1.  History of dialted Cardiomyopathy with chronic systolic CHF.  EF last normal 55%- has BiV/ICD in place -  I think  overall she is doing great.  We stopped metoprolol due to Raynauds symptoms. Echo in May was unchanged. She is asymptomatic.   2. NSVT. Follow up ICD/biV in device clinic.   3. Paroxysmal Afib. [place] Vasc score of at least 4. On Xarelto. Tolerating well.   4. Small AAA and left iliac aneurysm. Follow up with [facility]. Last doppler in August 2018 showed iliac diameter 1.6 cm and Aorta 2.4 cm  5. Persistent cough. Recommend stopping ACEi and will switch to losartan 25 mg  daily.   6. Depression. I have strongly recommended she take the Trazadone prescribed.     I will follow up in 6 months.

## 2016-12-27 ENCOUNTER — Ambulatory Visit: Payer: Medicare Other | Admitting: Internal Medicine

## 2016-12-29 ENCOUNTER — Encounter: Payer: Self-pay | Admitting: Cardiology

## 2016-12-29 ENCOUNTER — Ambulatory Visit (INDEPENDENT_AMBULATORY_CARE_PROVIDER_SITE_OTHER): Payer: Medicare Other | Admitting: Cardiology

## 2016-12-29 VITALS — BP 112/64 | HR 58 | Ht 68.0 in | Wt 131.0 lb

## 2016-12-29 DIAGNOSIS — R05 Cough: Secondary | ICD-10-CM

## 2016-12-29 DIAGNOSIS — R059 Cough, unspecified: Secondary | ICD-10-CM

## 2016-12-29 DIAGNOSIS — I447 Left bundle-branch block, unspecified: Secondary | ICD-10-CM

## 2016-12-29 DIAGNOSIS — Z950 Presence of cardiac pacemaker: Secondary | ICD-10-CM

## 2016-12-29 DIAGNOSIS — I5022 Chronic systolic (congestive) heart failure: Secondary | ICD-10-CM

## 2016-12-29 DIAGNOSIS — I420 Dilated cardiomyopathy: Secondary | ICD-10-CM

## 2016-12-29 MED ORDER — LOSARTAN POTASSIUM 25 MG PO TABS: 25.0000 mg | ORAL_TABLET | Freq: Every day | ORAL | 3 refills | Status: DC

## 2016-12-29 NOTE — Patient Instructions (Signed)
Stop taking Mavik - trandolapril  Instead take losartan 25 mg daily  Continue your other therapy'  Go ahead and start Trazadone.

## 2017-01-05 ENCOUNTER — Encounter: Payer: Self-pay | Admitting: Acute Care

## 2017-01-05 ENCOUNTER — Ambulatory Visit (INDEPENDENT_AMBULATORY_CARE_PROVIDER_SITE_OTHER): Payer: Medicare Other | Admitting: Acute Care

## 2017-01-05 DIAGNOSIS — J42 Unspecified chronic bronchitis: Secondary | ICD-10-CM

## 2017-01-05 NOTE — Patient Instructions (Addendum)
It is nice to see you today. Continue the losartan per your cardiologist. Continue your Nasocrom as you have been doing Sugar free jolly ranchers for throat sothing Sips of water instead of throat clearing. Consider CT sinuses if no better with the above interventions. Allegra daily  Continue Aciphex for reflux as you have been doing. Follow up appointment in 2 months with Judson Roch NP. Please contact office for sooner follow up if symptoms do not improve or worsen or seek emergency care

## 2017-01-05 NOTE — Progress Notes (Signed)
History of Present Illness [name] is a 83 y.o. female former smoker with chronic cough, emphysema, and pulmonary nodules per CT scan.. She is followed by Dr. Chase [name].   01/05/2017 Follow Up Appointment: Pt. Presents for follow up. She was last seen 12/20/2016 for chronic bronchitis. She was treated at that time with CXR,prednisone taper, Bactrim and OTC cough medication. She presents today for follow up. She states she is a little better, but not much after the medication and prednisone. She is still throat clearing today.She was recently seen by cards, and they have changed her Mavic to losartan and she has noticed a slight improvement. She states she has been doing this for 1 week. She states she wants to go back to the [place]. I have encouraged her allow more time before before giving up. She states she does have some post nasal drip. She started Nasocrom 2-3 times daily for her post nasal drip. She thinks that mey also be helping.She is no longer coughing up bloody secretions.She denies fever, chest pain, orthopnea of hemoptysis.  Test Results: CXR: 12/20/2016: No acute findings.  Chest x-ray 10/20/2016 IMPRESSION: No acute abnormality noted.  HRCT 09/07/2016 No evidence of interstitial lung disease. No acute pulmonary disease. 2. Mild centrilobular emphysema with mild diffuse bronchial wall thickening, suggesting COPD. 3. Tiny scattered pulmonary nodules are all stable since 2010 and considered benign . 4. Left main and 3 vessel coronary atherosclerosis.  CBC Latest Ref Rng & Units 12/06/2016 10/20/2016 09/03/2016  WBC 3.8 - 10.8 Thousand/uL 5.4 7.4 7.3  Hemoglobin 11.7 - 15.5 g/dL 12.6 12.7 12.9  Hematocrit 35.0 - 45.0 % 37.4 38.7 38.2  Platelets 140 - 400 Thousand/uL 207 213.0 204.0    BMP Latest Ref Rng & Units 12/06/2016 07/07/2016 11/03/2015  Glucose 65 - 99 mg/dL 90 77 81  BUN 7 - 25 mg/dL 19 19 17   Creatinine 0.60 - 0.88 mg/dL 0.79 0.83 0.68  BUN/Creat Ratio 6  - 22 (calc) NOT APPLICABLE - -  Sodium 161 - 146 mmol/L 138 141 138  Potassium 3.5 - 5.3 mmol/L 4.7 4.6 4.8  Chloride 98 - 110 mmol/L 105 107 104  CO2 20 - 32 mmol/L 28 26 28   Calcium 8.6 - 10.4 mg/dL 9.5 9.4 9.2    BNP  ProBNP    Component Value Date/Time   PROBNP 111.0 (H) 06/08/2011 1440    PFT    Component Value Date/Time   FEV1PRE 2.48 09/15/2016 1155   FEV1POST 2.67 09/15/2016 1155   FVCPRE 3.63 09/15/2016 1155   FVCPOST 3.63 09/15/2016 1155   PREFEV1FVCRT 68 09/15/2016 1155   PSTFEV1FVCRT 74 09/15/2016 1155    Dg Chest 2 View  Result Date: 12/20/2016 CLINICAL DATA:  Cough and congestion for 3 weeks. Low-grade fever and hemoptysis. EXAM: CHEST  2 VIEW COMPARISON:  10/20/2016 and CT chest 09/07/2016. FINDINGS: Trachea is midline. Heart size normal. Thoracic aorta is calcified. Pacemaker and ICD lead tips are stable in position. Minimal biapical pleural thickening. Lungs are somewhat hyperinflated but clear. No pleural fluid. IMPRESSION: No acute findings. Electronically Signed   By: [name] M.D.   On: 12/20/2016 12:34     Past medical hx Past Medical History:  Diagnosis Date  . Anxiety disorder   . Barrett's esophagus 04/2008  . Benign neoplasm of colon   . CHF (congestive heart failure) ([place])   . Chronic airway obstruction, not elsewhere classified   . Complication of anesthesia    slow to wake up  .  Depressive disorder, not elsewhere classified   . Diverticulitis   . Dysthymic disorder   . Erosive gastritis 08/2004  . GERD (gastroesophageal reflux disease)   . Hair thinning   . HTN (hypertension)   . Hyperplastic polyps of stomach 06/2002  . Hypothyroidism   . ICD (implantable cardiac defibrillator) in place    BiV/ICD; s/p removal of ICD with insertion of BiV PPM 04/26/12  . Insomnia 08/20/2015  . Kyphosis 08/20/2015  . LBBB (left bundle branch block)   . Melanoma ([place])    right arm  . Mitral regurgitation   . Nonischemic cardiomyopathy ([place])     EF initially 20%; Last measurement up to 50% per echo in August of 2012  . Other and unspecified hyperlipidemia   . Other and unspecified hyperlipidemia   . Pacemaker 04/26/2012  . Pain in joint, lower leg   . Palpitations   . Restless legs syndrome (RLS)   . Sleep apnea   . Synovial cyst of popliteal space   . Unspecified chronic bronchitis ([place])   . Unspecified nasal polyp   . Unspecified sinusitis (chronic)      Social History  Substance Use Topics  . Smoking status: Former Smoker    Packs/day: 1.00    Years: 15.00    Types: Cigarettes    Quit date: 12/22/1993  . Smokeless tobacco: Never Used     Comment: Does'nt reall year quit   . Alcohol use No    [name] reports that she quit smoking about 23 years ago. Her smoking use included Cigarettes. She has a 15.00 pack-year smoking history. She has never used smokeless tobacco. She reports that she does not drink alcohol or use drugs.  Tobacco Cessation: Former smoker quit 12/22/1993 with a 15-pack-year smoking history  Past surgical hx, Family hx, Social hx all reviewed.  Current Outpatient Prescriptions on File Prior to Visit  Medication Sig  . acetaminophen (TYLENOL) 500 MG tablet Take 500 mg by mouth every 6 (six) hours as needed for pain. For pain  . Ascorbic Acid (VITAMIN C) 1000 MG tablet Take 1,000 mg by mouth daily.  . Biotin 5000 MCG TABS Take 1 tablet by mouth daily.  . Calcium Carbonate-Vitamin D (CALCIUM + D PO) Take 2 tablets by mouth daily.   . Cyanocobalamin (VITAMIN B 12 PO) Take 1 tablet by mouth daily.   Marland Kitchen ESTRACE VAGINAL 0.1 MG/GM vaginal cream Place 1 g vaginally 2 (two) times a week.   . furosemide (LASIX) 20 MG tablet Take 1 tablet (20 mg total) by mouth daily as needed for fluid or edema.  Marland Kitchen levothyroxine (SYNTHROID, LEVOTHROID) 112 MCG tablet TAKE 1 TABLET BY MOUTH EVERY DAY 30 MINUTES BEFORE BREAKFAST FOR THYROID  . losartan (COZAAR) 25 MG tablet Take 1 tablet (25 mg total) by mouth daily.  .  Magnesium 250 MG TABS Take 1 tablet by mouth daily.   . Multiple Vitamin (MULTIVITAMIN WITH MINERALS) TABS Take 1 tablet by mouth daily.  . potassium chloride SA (K-DUR,KLOR-CON) 20 MEQ tablet Take 1 tablet (20 mEq total) by mouth daily as needed (only with lasix).  . RABEprazole (ACIPHEX) 20 MG tablet TAKE 1 TABLET ONCE DAILY   FOR STOMACH *LUPIN MFR*  . traZODone (DESYREL) 50 MG tablet Take 0.5 tablets (25 mg total) by mouth at bedtime.  Alveda Reasons 20 MG TABS tablet TAKE 1 TABLET DAILY WITH   SUPPER  . [DISCONTINUED] omeprazole (PRILOSEC) 20 MG capsule Take 1 capsule (20 mg total) by mouth daily.  No current facility-administered medications on file prior to visit.      Allergies  Allergen Reactions  . Hydrocodone Itching  . Cephalexin Itching and Swelling  . Ciprofloxacin     Not indicated due to aneurysm in aorta    . Doxycycline Other (See Comments)    Per pt: unknown  . Escitalopram     Dry mouth  . Levofloxacin     Not indicated due to aneurysm in aorta   . Moxifloxacin     Not indicated due to aneurysm in aorta   . Ofloxacin Other (See Comments)    Per pt: unknown  . Sertraline     insomnia  . Tape Other (See Comments)    Burns skin.   . Latex Rash    "If pt. Makes contact with or wearing"  . Neomycin Rash    Review Of Systems:  Constitutional:   No  weight loss, night sweats,  Fevers, chills, fatigue, or  lassitude.  HEENT:   No headaches,  Difficulty swallowing,  Tooth/dental problems, or  Sore throat,                No sneezing, itching, ear ache, nasal congestion, post nasal drip,   CV:  No chest pain,  Orthopnea, PND, swelling in lower extremities, anasarca, dizziness, palpitations, syncope.   GI  No heartburn, indigestion, abdominal pain, nausea, vomiting, diarrhea, change in bowel habits, loss of appetite, bloody stools.   Resp: No shortness of breath with exertion or at rest.  No excess mucus, no  productive cough,  + non-productive cough,  No coughing  up of blood.  No change in color of mucus.  No wheezing.  No chest wall deformity  Skin: no rash or lesions.  GU: no dysuria, change in color of urine, no urgency or frequency.  No flank pain, no hematuria   MS:  No joint pain or swelling.  No decreased range of motion.  No back pain.  Psych:  No change in mood or affect. No depression or anxiety.  No memory loss.   Vital Signs BP 110/62 (BP Location: Right Arm)   Pulse (!) 56   Ht 5\' 8"  (1.727 m)   Wt 131 lb 6.4 oz (59.6 kg)   SpO2 99%   BMI 19.98 kg/m    Physical Exam:  General- No distress,  A&Ox3 pleasant ENT: No sinus tenderness, TM clear, pale nasal mucosa, no oral exudate,no post nasal drip, no LAN Cardiac: S1, S2, regular rate and rhythm, no murmur Chest: No wheeze/ rales/ dullness; no accessory muscle use, no nasal flaring, no sternal retractions Abd.: Soft Non-tender nondistended, nonobese,,  Ext: No clubbing cyanosis, edema Neuro:  normal strength Skin: No rashes, warm and dry Psych: normal mood and behavior   Assessment/Plan  Chronic bronchitis (HCC) Resolving after treatment with prednisone and antibiotic Plan Continue the losartan per your cardiologist. Continue your Nasocrom as you have been doing Sugar free jolly ranchers for throat sothing Sips of water instead of throat clearing. Consider CT sinuses if no better with the above interventions. Allegra daily  Continue Aciphex for reflux as you have been doing. Follow up appointment in 2 months with Judson Roch NP. Please contact office for sooner follow up if symptoms do not improve or worsen or seek emergency care       Magdalen Spatz, NP 01/05/2017  7:08 PM

## 2017-01-05 NOTE — Assessment & Plan Note (Signed)
Resolving after treatment with prednisone and antibiotic Plan Continue the losartan per your cardiologist. Continue your Nasocrom as you have been doing Sugar free jolly ranchers for throat sothing Sips of water instead of throat clearing. Consider CT sinuses if no better with the above interventions. Allegra daily  Continue Aciphex for reflux as you have been doing. Follow up appointment in 2 months with Judson Roch NP. Please contact office for sooner follow up if symptoms do not improve or worsen or seek emergency care

## 2017-01-21 ENCOUNTER — Telehealth: Payer: Self-pay | Admitting: Cardiology

## 2017-01-21 ENCOUNTER — Telehealth: Payer: Self-pay | Admitting: Acute Care

## 2017-01-21 MED ORDER — RIVAROXABAN 20 MG PO TABS: ORAL_TABLET | ORAL | 3 refills | Status: DC

## 2017-01-21 NOTE — Telephone Encounter (Signed)
She has had multiple visits since June 2018 all with Eric Form and nothing with me. If this is not a crisis for the weekend and is a chronic issue I would like to see her myself next few weeks. But if is a wekend crisis then she can try Please take prednisone 40 mg x1 day, then 30 mg x1 day, then 20 mg x1 day, then 10 mg x1 day, and then 5 mg x1 day and stop

## 2017-01-21 NOTE — Telephone Encounter (Signed)
Returned call to patient she wanted to know if ok to take losartan, she heard there was a recall.Advised plain losartan no recall.90 day refill on xarelto sent to mail order pharmacy.

## 2017-01-21 NOTE — Telephone Encounter (Signed)
Please call.

## 2017-01-21 NOTE — Telephone Encounter (Signed)
Spoke with pt, who states cough has slightly improved but is still present. Pt states cough occ produces tan mucus mixed with dark blood. Pt denies any other symptoms. Pt is requesting further recommendations.   MR please advise. Thanks.

## 2017-01-21 NOTE — Telephone Encounter (Signed)
lmtcb x1 for pt. 

## 2017-01-21 NOTE — Telephone Encounter (Signed)
Spoke with patient. Advised her that MR would like for her to be seen by him in the next few weeks. She declined this. Also advised that MR wanted to call Prednisone for her. She declined this as well. She only wanted Sarah's recommendations.   I advised her that Judson Roch was not in the office today and I did not want her to go the weekend without medication. Patient stated that she would rather wait for Sarah's recommendations.   Sarah, please advise. Thanks!

## 2017-01-24 NOTE — Telephone Encounter (Signed)
Please let her know she needs to be seen by [facility] this time. Please make an appointment for her to be seen. Let her know she has seen me the last 5 times, and per office policy she needs to be seen by MR next.   Dr. Chase Caller,  She had an HRCT 09/2016. No ILD or concerning nodules. She is a 15 pack year smoker who quit 1995. Cards recently changed her Mavic to losartan with some improvement. I thought she may need a CT sinuses if she didn't have resolution of cough. She didn't want to do this at the last visit. She always seems to have post nasal drip.  Let me know what you do for her and I will be happy to see her as follow up. Thanks

## 2017-01-24 NOTE — Telephone Encounter (Signed)
Spoke with pt, aware of recs.  Scheduled to see MR on 12/3.  Nothing further needed.

## 2017-02-01 DIAGNOSIS — N390 Urinary tract infection, site not specified: Secondary | ICD-10-CM | POA: Diagnosis not present

## 2017-02-07 ENCOUNTER — Ambulatory Visit (INDEPENDENT_AMBULATORY_CARE_PROVIDER_SITE_OTHER): Payer: Medicare Other | Admitting: Internal Medicine

## 2017-02-07 ENCOUNTER — Encounter: Payer: Self-pay | Admitting: Internal Medicine

## 2017-02-07 VITALS — BP 130/78 | HR 55 | Ht 68.0 in | Wt 133.6 lb

## 2017-02-07 DIAGNOSIS — R05 Cough: Secondary | ICD-10-CM | POA: Diagnosis not present

## 2017-02-07 DIAGNOSIS — J387 Other diseases of larynx: Secondary | ICD-10-CM | POA: Diagnosis not present

## 2017-02-07 DIAGNOSIS — R053 Chronic cough: Secondary | ICD-10-CM

## 2017-02-07 DIAGNOSIS — R059 Cough, unspecified: Secondary | ICD-10-CM

## 2017-02-07 DIAGNOSIS — J431 Panlobular emphysema: Secondary | ICD-10-CM | POA: Diagnosis not present

## 2017-02-07 DIAGNOSIS — J321 Chronic frontal sinusitis: Secondary | ICD-10-CM | POA: Diagnosis not present

## 2017-02-07 MED ORDER — ACLIDINIUM BROMIDE 400 MCG/ACT IN AEPB: 1.0000 | INHALATION_SPRAY | Freq: Two times a day (BID) | RESPIRATORY_TRACT | 0 refills | Status: DC

## 2017-02-07 MED ORDER — FLUTICASONE PROPIONATE 50 MCG/ACT NA SUSP: 2.0000 | Freq: Every day | NASAL | 5 refills | Status: DC

## 2017-02-07 NOTE — Patient Instructions (Signed)
ICD-10-CM   1. Chronic cough R05   2. Chronic frontal sinusitis J32.1   3. Panlobular emphysema ([place]) J43.1   4. Irritable larynx syndrome J38.7     chronic cough is probably because of #2, #3 and #4  Plan - We will tackle #2 and #3 at this point - Get sinus CT without contrast next few to several weeks - Continue Allegra -Start nasal steroid generic fluticasone 2 squirts intranasally daily - Start empiric inhaled anticholinergic sample to 6 weeks -We will address cough neuropathy later but to take an article about it  Follow-up 4-6 weeks but after completing all of the above

## 2017-02-07 NOTE — Progress Notes (Signed)
Subjective:     Patient ID: [name], female   DOB: [birth year], 83 y.o.   MRN: [id number]  HPI    IOV 09/03/2016  Chief Complaint  Patient presents with  . pulmonary consult    Pt referred by Dr. Jonelle Sidle Reed/ Dr. Early Osmond for COPD. Pt consisting productive coughing since Oct 2017. Pt has coughing up some blood strikes and yellow last two weeks. Pt has some mid back pain with cough. No rescue inhalers, no neb treatments. Pt seen ENT for drainage, concerned regarding cough.      83 year old female self-referred for the possibility of COPD but in talking to light really seems like she is chronic cough  She tells me that she had insidious onset of chronic cough in October 2017. Since then it has been persistent. Moderate in severity. Associated with chest tightness and yellow sputum and occasional streaks of hemoptysis. She's been treated with Z-Pak 2 times once late last year months in spring 2018 without any relief. No history of prednisone intake. She did have a chest x-ray in October 2017 that approximately visualized and is clear. She has seen ENT services for this and was told she has postnasal drip. She could not tolerate nasal steroid. She's been on antihistamine Allegra without any relief. Chart review also shows she has Barrett's esophagus. She is not on associated ace inhibitors. 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   FeNO x 6 attempts could not do  Echocardiogram 07/23/2006) ejection fraction 55%  11/17/2016 2 week follow up: Pt. Presents for follow up. She was seen 10/20/2016 for cough. Plan of care after that visit was:  Serum allergy profile CBC We will add Mucinex 1200 mg once daily for chest congestion.  Take with full glass of water. Allegra every night  Add the Nasocort 2 sprays in each nostril once daily. Continue Acifex daily as you have been doing. Delsym every 12 hours for cough. Sips of water instead of throat clearing Sugar free  hard candy for throat soothing. No mint or menthol, no chocolate. Consider CT Chest if cough does not resolve.  Pt. States she has been doing better. She states she only coughs in the morning now. This is a big improvement.She states she does still have some mucus production. Mucus is white.She states she feels she has some post nasal drip.She is not using the Allegra daily as she was instructed to do.She is compliant with her Nasacort daily. She is compliant with her Mucinex " mostly every day". She states she does have some itching that she notices is worse when she is not taking her Allegra every day. She states she does not like taking medication. She states this is not a financial issue.She states she has some nasal irritation, so we will have her start nasal saline    Test Results:  Chest x-ray 10/20/2016 IMPRESSION: No acute abnormality noted.  HRCT 09/07/2016 No evidence of interstitial lung disease. No acute pulmonary disease. 2. Mild centrilobular emphysema with mild diffuse bronchial wall thickening, suggesting COPD. 3. Tiny scattered pulmonary nodules are all stable since 2010 and considered benign . 4. Left main and 3 vessel coronary atherosclerosis.  Bllood allergy profile and IGE 10/20/16 - normal/.negative   12/20/2016 Acute OV: Pt. Presents for acute visit with cough.She states she has been doing better with her cough. Then on 9/23 or 9/24 she developed a terrible cold. She had copious secretions. She saw her new Dr. Mariea Clonts on 12/06/2016. She  was treated with Augmentin at that time. She is here because she has been having continued blood tinged secretions. She states she has been having bloody secretions x at least 1 week. Not every time she coughs, but at least some every day.She states her cough had been improving until 9/23. She states her chest tightness is better. She feels warm to the  today in the office. She is coughing in the office today. When she is not coughing up  bloody secretions she is coughing up brown to murky secretions. She states she does not like to take drugs. She looks them up on Google and if she does not like the side effects she does not take them, and lists them as an allergy. We have significant limitations in treatment options  due to the medications she will agree to take.    01/05/2017 Follow Up Appointment: Pt. Presents for follow up. She was last seen 12/20/2016 for chronic bronchitis. She was treated at that time with CXR,prednisone taper, Bactrim and OTC cough medication. She presents today for follow up. She states she is a little better, but not much after the medication and prednisone. She is still throat clearing today.She was recently seen by cards, and they have changed her Mavic to losartan and she has noticed a slight improvement. She states she has been doing this for 1 week. She states she wants to go back to the [place]. I have encouraged her allow more time before before giving up. She states she does have some post nasal drip. She started Nasocrom 2-3 times daily for her post nasal drip. She thinks that mey also be helping.She is no longer coughing up bloody secretions.She denies fever, chest pain, orthopnea of hemoptysis.    OV 02/07/2017  Chief Complaint  Patient presents with  . Follow-up    Pt states that she has her ups and downs when she thinks she is better, she gets worse. Pt has complaints of a cough that comes and goes which she thinks is coming from sinus drainage.    Follow-up chronic cough  She is no better since her first consultation. In between she is medically visits to the nurse practitioner treated acutely and given follow-up results but really no relief of cough. RSI cough score is 24. She is having chronic postnasal drainage that is significant. But she does not wake up in the middle of the night with her cough. She is interested in getting a sinus CT and specifically asked for it. She's never tried  inhalers therapy and is wondering about it. She is frustrated by her chronic cough. RSI cough score indicates moderate severity. She does have some emphysema on the CT scan   Dr Lorenza [place] Reflux Symptom Index (> 13-15 suggestive of LPR cough)  09/03/2016  02/07/2017   Hoarseness of problem with voice 2 4  Clearing  Of Throat 4 4  Excess throat mucus or feeling of post nasal drip 4 5  Difficulty swallowing food, liquid or tablets 0 0  Cough after eating or lying down 2 4  Breathing difficulties or choking episodes 0 0  Troublesome or annoying cough 4 4  Sensation of something sticking in throat or lump in throat 1 3  Heartburn, chest pain, indigestion, or stomach acid coming up 1 0  TOTAL 18 24    [place] 02/07/2017 - 34 ppb   has a past medical history of Anxiety disorder, Barrett's esophagus (04/2008), Benign neoplasm of colon, CHF (congestive heart failure) ([place]),  Chronic airway obstruction, not elsewhere classified, Complication of anesthesia, Depressive disorder, not elsewhere classified, Diverticulitis, Dysthymic disorder, Erosive gastritis (08/2004), GERD (gastroesophageal reflux disease), Hair thinning, HTN (hypertension), Hyperplastic polyps of stomach (06/2002), Hypothyroidism, ICD (implantable cardiac defibrillator) in place, Insomnia (08/20/2015), Kyphosis (08/20/2015), LBBB (left bundle branch block), Melanoma ([place]), Mitral regurgitation, Nonischemic cardiomyopathy ([place]), Other and unspecified hyperlipidemia, Other and unspecified hyperlipidemia, Pacemaker (04/26/2012), Pain in joint, lower leg, Palpitations, Restless legs syndrome (RLS), Sleep apnea, Synovial cyst of popliteal space, Unspecified chronic bronchitis ([place]), Unspecified nasal polyp, and Unspecified sinusitis (chronic).   reports that she quit smoking about 23 years ago. Her smoking use included cigarettes. She has a 15.00 pack-year smoking history. she has never used smokeless tobacco.  Past Surgical History:  Procedure  Laterality Date  . ABDOMINAL HYSTERECTOMY  1076   endometriosis  . APPENDECTOMY    . BI-VENTRICULAR PACEMAKER INSERTION N/A 04/26/2012   Procedure: BI-VENTRICULAR PACEMAKER INSERTION (CRT-P);  Surgeon: Evans Lance, MD;  Location: [facility] CATH LAB;  Service: Cardiovascular;  Laterality: N/A;  . defibrillator insertion     s/p removal of previously implanted BiV ICD and insertion of a new BiV pacemaker on 04/26/12  . excise mole of lip  2001   Dr. Larena Sox  . excision of wen  1984   Dr. Parks Ranger  . FEMORAL HERNIA REPAIR  12/25/2012   Dr Dalbert Batman  . FEMORAL HERNIA REPAIR  01/04/2013   Recurrent - Dr. Lilyan Punt  . INCISIONAL HERNIA REPAIR N/A 10/01/2013   Procedure: LAPAROSCOPIC INCISIONAL HERNIA;  Surgeon: Gayland Curry, MD;  Location: [facility];  Service: General;  Laterality: N/A;  . INGUINAL HERNIA REPAIR Right 09/26/2012   Procedure: RIGHT INGUINAL HERNIA REPAIR WITH MESH;  Surgeon: Odis Hollingshead, MD;  Location: [facility];  Service: General;  Laterality: Right;  . INGUINAL HERNIA REPAIR Right 12/25/2012   Procedure: explor right groin, small bowel rescection, tissue repair right femoral hernia;  Surgeon: Adin Hector, MD;  Location: [facility];  Service: General;  Laterality: Right;  . INSERTION OF MESH Right 09/26/2012   Procedure: INSERTION OF MESH;  Surgeon: Odis Hollingshead, MD;  Location: [facility];  Service: General;  Laterality: Right;  . INSERTION OF MESH N/A 10/01/2013   Procedure: INSERTION OF MESH;  Surgeon: Gayland Curry, MD;  Location: [facility];  Service: General;  Laterality: N/A;  . LAPAROSCOPY N/A 01/04/2013   Procedure: Diagnostic Laparoscopy, exploratory laparotomy with small bowel resection, closure of right femoral hernia repair;  Surgeon: Madilyn Hook, DO;  Location: [facility];  Service: General;  Laterality: N/A;  . Left arm surgery    . RIGHT BREAST LUMPECTOMY  1988   Dr. Marylene Buerger  . right knee surgery     arthroscopy: Dr. Shellia Carwin  . TONSILLECTOMY      Allergies  Allergen  Reactions  . Hydrocodone Itching  . Cephalexin Itching and Swelling  . Ciprofloxacin     Not indicated due to aneurysm in aorta    . Doxycycline Other (See Comments)    Per pt: unknown  . Escitalopram     Dry mouth  . Levofloxacin     Not indicated due to aneurysm in aorta   . Moxifloxacin     Not indicated due to aneurysm in aorta   . Ofloxacin Other (See Comments)    Per pt: unknown  . Sertraline     insomnia  . Sulfamethoxazole Hives    Any sulfa meds.  . Tape Other (See Comments)  Burns skin.   . Latex Rash    "If pt. Makes contact with or wearing"  . Neomycin Rash    Immunization History  Administered Date(s) Administered  . Influenza Whole 12/28/2011, 03/08/2013  . Influenza,inj,Quad PF,6+ Mos 01/08/2014, 11/20/2014, 01/26/2016  . Pneumococcal Polysaccharide-23 03/13/2008  . Tdap 03/16/2011    Family History  Problem Relation Age of Onset  . Stroke Father   . Heart disease Other        maternal side  . Colon cancer Paternal Uncle   . Breast cancer Maternal Aunt   . Diabetes Neg Hx      Current Outpatient Medications:  .  acetaminophen (TYLENOL) 500 MG tablet, Take 500 mg by mouth every 6 (six) hours as needed for pain. For pain, Disp: , Rfl:  .  Ascorbic Acid (VITAMIN C) 1000 MG tablet, Take 1,000 mg by mouth daily., Disp: , Rfl:  .  Biotin 5000 MCG TABS, Take 1 tablet by mouth daily., Disp: , Rfl:  .  Calcium Carbonate-Vitamin D (CALCIUM + D PO), Take 2 tablets by mouth daily. , Disp: , Rfl:  .  Cyanocobalamin (VITAMIN B 12 PO), Take 1 tablet by mouth daily. , Disp: , Rfl:  .  ESTRACE VAGINAL 0.1 MG/GM vaginal cream, Place 1 g vaginally 2 (two) times a week. , Disp: , Rfl:  .  furosemide (LASIX) 20 MG tablet, Take 1 tablet (20 mg total) by mouth daily as needed for fluid or edema., Disp: 30 tablet, Rfl: 6 .  levothyroxine (SYNTHROID, LEVOTHROID) 112 MCG tablet, TAKE 1 TABLET BY MOUTH EVERY DAY 30 MINUTES BEFORE BREAKFAST FOR THYROID, Disp: 90 tablet, Rfl:  0 .  losartan (COZAAR) 25 MG tablet, Take 1 tablet (25 mg total) by mouth daily., Disp: 90 tablet, Rfl: 3 .  Magnesium 250 MG TABS, Take 1 tablet by mouth daily. , Disp: , Rfl:  .  Multiple Vitamin (MULTIVITAMIN WITH MINERALS) TABS, Take 1 tablet by mouth daily., Disp: , Rfl:  .  nitrofurantoin, macrocrystal-monohydrate, (MACROBID) 100 MG capsule, TAKE ONE CAPSULE BY MOUTH TWICE DAILY FOR 7 DAYS, Disp: , Rfl: 1 .  potassium chloride SA (K-DUR,KLOR-CON) 20 MEQ tablet, Take 1 tablet (20 mEq total) by mouth daily as needed (only with lasix)., Disp: 30 tablet, Rfl: 6 .  RABEprazole (ACIPHEX) 20 MG tablet, TAKE 1 TABLET ONCE DAILY   FOR STOMACH *LUPIN MFR*, Disp: 90 tablet, Rfl: 0 .  rivaroxaban (XARELTO) 20 MG TABS tablet, TAKE 1 TABLET DAILY WITH   SUPPER, Disp: 90 tablet, Rfl: 3 .  traZODone (DESYREL) 50 MG tablet, Take 0.5 tablets (25 mg total) by mouth at bedtime. (Patient not taking: Reported on 02/07/2017), Disp: 15 tablet, Rfl: 5  Review of Systems     Objective:   Physical Exam  Constitutional: She is oriented to person, place, and time. She appears well-developed and well-nourished. No distress.  HENT:  Head: Normocephalic and atraumatic.  Right Ear: External ear normal.  Left Ear: External ear normal.  Mouth/Throat: Oropharynx is clear and moist. No oropharyngeal exudate.  Post nasal drip  Eyes: Conjunctivae and EOM are normal. Pupils are equal, round, and reactive to light. Right eye exhibits no discharge. Left eye exhibits no discharge. No scleral icterus.  Neck: Normal range of motion. Neck supple. No JVD present. No tracheal deviation present. No thyromegaly present.  Cardiovascular: Normal rate, regular rhythm, normal heart sounds and intact distal pulses. Exam reveals no gallop and no friction rub.  No murmur heard. Pulmonary/Chest: Effort  normal and breath sounds normal. No respiratory distress. She has no wheezes. She has no rales. She exhibits no tenderness.  Abdominal: Soft.  Bowel sounds are normal. She exhibits no distension and no mass. There is no tenderness. There is no rebound and no guarding.  Musculoskeletal: Normal range of motion. She exhibits no edema or tenderness.  Lymphadenopathy:    She has no cervical adenopathy.  Neurological: She is alert and oriented to person, place, and time. She has normal reflexes. No cranial nerve deficit. She exhibits normal muscle tone. Coordination normal.  Skin: Skin is warm and dry. No rash noted. She is not diaphoretic. No erythema. No pallor.  Psychiatric: She has a normal mood and affect. Her behavior is normal. Judgment and thought content normal.  Vitals reviewed.  Vitals:   02/07/17 1447  BP: 130/78  Pulse: (!) 55  SpO2: 97%  Weight: 133 lb 9.6 oz (60.6 kg)  Height: 5\' 8"  (1.727 m)    Estimated body mass index is 20.31 kg/m as calculated from the following:   Height as of this encounter: 5\' 8"  (1.727 m).   Weight as of this encounter: 133 lb 9.6 oz (60.6 kg).      Assessment:       ICD-10-CM   1. Chronic cough R05   2. Chronic frontal sinusitis J32.1   3. Panlobular emphysema ([place]) J43.1   4. Irritable larynx syndrome J38.7        Plan:       chronic cough is probably because of #2, #3 and #4  Plan - We will tackle #2 and #3 at this point - Get sinus CT without contrast next few to several weeks - Continue Allegra -Start nasal steroid generic fluticasone 2 squirts intranasally daily - Start empiric inhaled anticholinergic sample to 6 weeks -We will address cough neuropathy later but to take an article about it  Follow-up 4-6 weeks but after completing all of the above    > 50% of this > 25 min visit spent in face to face counseling or coordination of care   Dr. Brand Males, M.D., [facility].C.P Pulmonary and Critical Care Medicine Staff Physician, [place] Director - Interstitial Lung Disease  Program  Pulmonary [place] [facility] [place], [place], [address]  Pager: [phone], If no answer or between  15:00h - 7:00h: call [phone] Telephone: [phone]

## 2017-02-07 NOTE — Addendum Note (Signed)
Addended by: Lorretta Harp on: 02/07/2017 03:44 PM   Modules accepted: Orders

## 2017-02-07 NOTE — Addendum Note (Signed)
Addended by: Lorretta Harp on: 02/07/2017 05:08 PM   Modules accepted: Orders

## 2017-02-08 LAB — NITRIC OXIDE: Nitric Oxide: 34

## 2017-02-08 NOTE — Addendum Note (Signed)
Addended by: Lorretta Harp on: 02/08/2017 08:40 AM   Modules accepted: Orders

## 2017-02-17 ENCOUNTER — Inpatient Hospital Stay: Admission: RE | Admit: 2017-02-17 | Payer: Medicare Other | Source: Ambulatory Visit

## 2017-02-18 ENCOUNTER — Telehealth: Payer: Self-pay | Admitting: Cardiology

## 2017-02-18 ENCOUNTER — Other Ambulatory Visit: Payer: Self-pay | Admitting: *Deleted

## 2017-02-18 ENCOUNTER — Ambulatory Visit (INDEPENDENT_AMBULATORY_CARE_PROVIDER_SITE_OTHER)
Admission: RE | Admit: 2017-02-18 | Discharge: 2017-02-18 | Disposition: A | Discharge status: Home / Self Care | Payer: Medicare Other | Source: Ambulatory Visit | Attending: Internal Medicine | Admitting: Internal Medicine

## 2017-02-18 DIAGNOSIS — R059 Cough, unspecified: Secondary | ICD-10-CM

## 2017-02-18 DIAGNOSIS — R05 Cough: Secondary | ICD-10-CM | POA: Diagnosis not present

## 2017-02-18 MED ORDER — RABEPRAZOLE SODIUM 20 MG PO TBEC: DELAYED_RELEASE_TABLET | ORAL | 3 refills | Status: DC

## 2017-02-18 NOTE — Telephone Encounter (Signed)
Patient requested refill to [facility]

## 2017-02-18 NOTE — Telephone Encounter (Signed)
Returned call to patient Dr.Jordan's recommendations given. 

## 2017-02-18 NOTE — Telephone Encounter (Signed)
New Message   Pt c/o medication issue:  1. Name of Medication: Losartan  2. How are you currently taking this medication (dosage and times per day)? 25mg  1 time a day   3. Are you having a reaction (difficulty breathing--STAT)? No   4. What is your medication issue? Patient states that the Losartan did not cause the cough and she wants to go back to the prior medication. Please call.

## 2017-02-18 NOTE — Telephone Encounter (Signed)
Returned call to patient, states when she saw Dr. [name] at last OV she was experiencing a cough and Dr. [name] thought it may be due to her medication, Mavik (trandolapril).   Patient states this was stopped and Losartan was started.   Patient states cough did not improve off of this medication and the losartan is causing constipation.  Patient requesting to go back to taking the [facility].  Advised I would route to Dr. [name] for review.   Patient also wondering if she starts the [facility] back, what dose and frequency.

## 2017-02-18 NOTE — Telephone Encounter (Signed)
She can resume Mavik instead of losartan. She was on 2 mg bid.  Meya Clutter [name] MD, [facility]

## 2017-02-24 ENCOUNTER — Telehealth: Payer: Self-pay | Admitting: Internal Medicine

## 2017-02-24 NOTE — Telephone Encounter (Signed)
Advised pt of results. Pt understood and nothing further is needed.   

## 2017-02-24 NOTE — Telephone Encounter (Signed)
Ct sinus is much improved from 2013 but has some mild thickening in one of the sinus cavitities called ethmoid sinus. She si the continue the nasal steroid I asked her to start and then I can see her Mar 16, 2017 visit

## 2017-02-24 NOTE — Telephone Encounter (Signed)
MR please advise on CT sinus results.  Called pt to let her know we will have MR review and then call her.

## 2017-03-07 DIAGNOSIS — N390 Urinary tract infection, site not specified: Secondary | ICD-10-CM | POA: Diagnosis not present

## 2017-03-07 DIAGNOSIS — R350 Frequency of micturition: Secondary | ICD-10-CM | POA: Diagnosis not present

## 2017-03-07 DIAGNOSIS — R351 Nocturia: Secondary | ICD-10-CM | POA: Diagnosis not present

## 2017-03-10 ENCOUNTER — Other Ambulatory Visit: Payer: Self-pay | Admitting: Internal Medicine

## 2017-03-14 ENCOUNTER — Ambulatory Visit (INDEPENDENT_AMBULATORY_CARE_PROVIDER_SITE_OTHER): Payer: Medicare Other | Admitting: Internal Medicine

## 2017-03-14 ENCOUNTER — Encounter: Payer: Self-pay | Admitting: Internal Medicine

## 2017-03-14 ENCOUNTER — Ambulatory Visit: Payer: Self-pay | Admitting: Internal Medicine

## 2017-03-14 VITALS — BP 120/60 | HR 54 | Temp 97.6°F | Wt 128.0 lb

## 2017-03-14 DIAGNOSIS — F331 Major depressive disorder, recurrent, moderate: Secondary | ICD-10-CM

## 2017-03-14 DIAGNOSIS — J42 Unspecified chronic bronchitis: Secondary | ICD-10-CM | POA: Diagnosis not present

## 2017-03-14 DIAGNOSIS — G4700 Insomnia, unspecified: Secondary | ICD-10-CM

## 2017-03-14 DIAGNOSIS — Z23 Encounter for immunization: Secondary | ICD-10-CM

## 2017-03-14 DIAGNOSIS — M545 Low back pain, unspecified: Secondary | ICD-10-CM

## 2017-03-14 DIAGNOSIS — E039 Hypothyroidism, unspecified: Secondary | ICD-10-CM

## 2017-03-14 DIAGNOSIS — G8929 Other chronic pain: Secondary | ICD-10-CM

## 2017-03-14 MED ORDER — TRAZODONE HCL 50 MG PO TABS: ORAL_TABLET | ORAL | 5 refills | Status: DC

## 2017-03-14 NOTE — Progress Notes (Signed)
Location:  [facility] clinic Provider:  Geoffry Bannister L. Mariea Clonts, D.O., C.M.D.  Code Status: Full Code Goals of Care:  Advanced Directives 12/06/2016  Does Patient Have a Medical Advance Directive? Yes  Type of Paramedic of [place];Living will  Does patient want to make changes to medical advance directive? -  Copy of [place] in Chart? No - copy requested  Would patient like information on creating a medical advance directive? -  Pre-existing out of facility DNR order (yellow form or pink MOST form) -     Chief Complaint  Patient presents with  . Medical Management of Chronic Issues    follow-up    HPI: Patient is a 84 y.o. female seen today for medical management of chronic diseases.    Depression: Has not taken the medication ordered (Trazodone) due to worry about medication interactions and sedation.   Insomnia: Not sleeping well, but is worried about taking the Trazodone.   States she has stopped some of her medications from the pulmonologist, over the last several days, this included Allegra. She also states she never started the inhalers from the pulmonologist because of worry about having an interaction.    Back pain: Increased lower back pain around the waist line. This is not new, she states she has had this before and has seen an Orthopedic. They ordered PT, but she never went. Today she states that she would like to go back to see the Orthopedic doctor. Tylenol does help some and she takes 2-4 extra strength tylenol on the days she needs it. This does seem to help. There are no other modifying factors.   She says she is worried about taking the flu shot. She is unsure if she will get sick from it.    Past Medical History:  Diagnosis Date  . Anxiety disorder   . Barrett's esophagus 04/2008  . Benign neoplasm of colon   . CHF (congestive heart failure) ([place])   . Chronic airway obstruction, not elsewhere classified   . Complication of  anesthesia    slow to wake up  . Depressive disorder, not elsewhere classified   . Diverticulitis   . Dysthymic disorder   . Erosive gastritis 08/2004  . GERD (gastroesophageal reflux disease)   . Hair thinning   . HTN (hypertension)   . Hyperplastic polyps of stomach 06/2002  . Hypothyroidism   . ICD (implantable cardiac defibrillator) in place    BiV/ICD; s/p removal of ICD with insertion of BiV PPM 04/26/12  . Insomnia 08/20/2015  . Kyphosis 08/20/2015  . LBBB (left bundle branch block)   . Melanoma ([place])    right arm  . Mitral regurgitation   . Nonischemic cardiomyopathy ([place])    EF initially 20%; Last measurement up to 50% per echo in August of 2012  . Other and unspecified hyperlipidemia   . Other and unspecified hyperlipidemia   . Pacemaker 04/26/2012  . Pain in joint, lower leg   . Palpitations   . Restless legs syndrome (RLS)   . Sleep apnea   . Synovial cyst of popliteal space   . Unspecified chronic bronchitis ([place])   . Unspecified nasal polyp   . Unspecified sinusitis (chronic)     Past Surgical History:  Procedure Laterality Date  . ABDOMINAL HYSTERECTOMY  1076   endometriosis  . APPENDECTOMY    . BI-VENTRICULAR PACEMAKER INSERTION N/A 04/26/2012   Procedure: BI-VENTRICULAR PACEMAKER INSERTION (CRT-P);  Surgeon: Evans Lance, MD;  Location:  [facility];  Service: Cardiovascular;  Laterality: N/A;  . defibrillator insertion     s/p removal of previously implanted BiV ICD and insertion of a new BiV pacemaker on 04/26/12  . excise mole of lip  2001   Dr. Larena Sox  . excision of wen  1984   Dr. Parks Ranger  . FEMORAL HERNIA REPAIR  12/25/2012   Dr Dalbert Batman  . FEMORAL HERNIA REPAIR  01/04/2013   Recurrent - Dr. Lilyan Punt  . INCISIONAL HERNIA REPAIR N/A 10/01/2013   Procedure: LAPAROSCOPIC INCISIONAL HERNIA;  Surgeon: Gayland Curry, MD;  Location: [facility];  Service: General;  Laterality: N/A;  . INGUINAL HERNIA REPAIR Right 09/26/2012   Procedure: RIGHT INGUINAL HERNIA  REPAIR WITH MESH;  Surgeon: Odis Hollingshead, MD;  Location: [facility];  Service: General;  Laterality: Right;  . INGUINAL HERNIA REPAIR Right 12/25/2012   Procedure: explor right groin, small bowel rescection, tissue repair right femoral hernia;  Surgeon: Adin Hector, MD;  Location: [facility];  Service: General;  Laterality: Right;  . INSERTION OF MESH Right 09/26/2012   Procedure: INSERTION OF MESH;  Surgeon: Odis Hollingshead, MD;  Location: [facility];  Service: General;  Laterality: Right;  . INSERTION OF MESH N/A 10/01/2013   Procedure: INSERTION OF MESH;  Surgeon: Gayland Curry, MD;  Location: [facility];  Service: General;  Laterality: N/A;  . LAPAROSCOPY N/A 01/04/2013   Procedure: Diagnostic Laparoscopy, exploratory laparotomy with small bowel resection, closure of right femoral hernia repair;  Surgeon: Madilyn Hook, DO;  Location: [facility];  Service: General;  Laterality: N/A;  . Left arm surgery    . RIGHT BREAST LUMPECTOMY  1988   Dr. Marylene Buerger  . right knee surgery     arthroscopy: Dr. Shellia Carwin  . TONSILLECTOMY      Allergies  Allergen Reactions  . Hydrocodone Itching  . Cephalexin Itching and Swelling  . Ciprofloxacin     Not indicated due to aneurysm in aorta    . Doxycycline Other (See Comments)    Per pt: unknown  . Escitalopram     Dry mouth  . Levofloxacin     Not indicated due to aneurysm in aorta   . Moxifloxacin     Not indicated due to aneurysm in aorta   . Ofloxacin Other (See Comments)    Per pt: unknown  . Sertraline     insomnia  . Sulfamethoxazole Hives    Any sulfa meds.  . Tape Other (See Comments)    Burns skin.   . Latex Rash    "If pt. Makes contact with or wearing"  . Neomycin Rash    Outpatient Encounter Medications as of 03/14/2017  Medication Sig  . acetaminophen (TYLENOL) 500 MG tablet Take 500 mg by mouth every 6 (six) hours as needed for pain. For pain  . Ascorbic Acid (VITAMIN C) 1000 MG tablet Take 1,000 mg by mouth daily.  . Biotin 5000  MCG TABS Take 1 tablet by mouth daily.  . Calcium Carbonate-Vitamin D (CALCIUM + D PO) Take 2 tablets by mouth daily.   . Cyanocobalamin (VITAMIN B 12 PO) Take 1 tablet by mouth daily.   Marland Kitchen ESTRACE VAGINAL 0.1 MG/GM vaginal cream Place 1 g vaginally 2 (two) times a week.   . fluticasone (FLONASE) 50 MCG/ACT nasal spray Place 2 sprays into both nostrils daily.  . furosemide (LASIX) 20 MG tablet Take 1 tablet (20 mg total) by mouth daily as needed for fluid or  edema.  . levothyroxine (SYNTHROID) 112 MCG tablet TAKE 1 TABLET BY MOUTH EVERY DAY 30 MINUTES BEFORE BREAKFAST FOR THYROID  . Magnesium 250 MG TABS Take 1 tablet by mouth daily.   . Multiple Vitamin (MULTIVITAMIN WITH MINERALS) TABS Take 1 tablet by mouth daily.  . potassium chloride SA (K-DUR,KLOR-CON) 20 MEQ tablet Take 1 tablet (20 mEq total) by mouth daily as needed (only with lasix).  . RABEprazole (ACIPHEX) 20 MG tablet TAKE 1 TABLET ONCE DAILY   FOR STOMACH *LUPIN MFR*  . rivaroxaban (XARELTO) 20 MG TABS tablet TAKE 1 TABLET DAILY WITH   SUPPER  . trandolapril (MAVIK) 2 MG tablet Take 2 mg twice a day  . traZODone (DESYREL) 50 MG tablet Take 0.5 tablets (25 mg total) by mouth at bedtime.  . [DISCONTINUED] Aclidinium Bromide (TUDORZA PRESSAIR) 400 MCG/ACT AEPB Inhale 1 puff into the lungs 2 (two) times daily.  . [DISCONTINUED] nitrofurantoin, macrocrystal-monohydrate, (MACROBID) 100 MG capsule TAKE ONE CAPSULE BY MOUTH TWICE DAILY FOR 7 DAYS   No facility-administered encounter medications on file as of 03/14/2017.     Review of Systems:  Review of Systems  Constitutional: Positive for malaise/fatigue. Negative for chills and fever.  HENT: Negative.   Eyes: Negative.   Respiratory: Positive for cough and sputum production. Negative for shortness of breath and wheezing.   Cardiovascular: Negative.   Gastrointestinal: Negative.   Genitourinary: Positive for frequency and urgency.       Just at night  Musculoskeletal: Positive for  back pain.  Skin: Negative.   Neurological: Negative.   Endo/Heme/Allergies: Negative.   Psychiatric/Behavioral: Positive for depression. The patient is nervous/anxious and has insomnia.        Mild anxiety due to lost of friends around the holiday.     Health Maintenance  Topic Date Due  . PNA vac Low Risk Adult (2 of 2 - PCV13) 03/13/2009  . INFLUENZA VACCINE  10/10/2017 (Originally 10/06/2016)  . MAMMOGRAM  08/26/2017  . TETANUS/TDAP  03/15/2021  . DEXA SCAN  Completed    Physical Exam: Vitals:   03/14/17 1434  BP: 120/60  Pulse: (!) 54  Temp: 97.6 F (36.4 C)  TempSrc: Oral  SpO2: 99%  Weight: 128 lb (58.1 kg)   Body mass index is 19.46 kg/m. Physical Exam  Constitutional: She is oriented to person, place, and time. She appears well-developed and well-nourished.  HENT:  Head: Normocephalic and atraumatic.  Eyes: Conjunctivae and EOM are normal. Pupils are equal, round, and reactive to light.  Neck: Normal range of motion.  Cardiovascular: Normal rate, regular rhythm, normal heart sounds and intact distal pulses.  Pulmonary/Chest: Effort normal.  Diminished bilateral bases.  Abdominal: Soft. Bowel sounds are normal.  Musculoskeletal: Normal range of motion.  Neurological: She is alert and oriented to person, place, and time.  Skin: Skin is warm and dry. Capillary refill takes less than 2 seconds.  Psychiatric: Her behavior is normal. Judgment and thought content normal.  Mood-cantankerous     Labs reviewed: Basic Metabolic Panel: Recent Labs    07/07/16 1054 12/06/16 1123  NA 141 138  K 4.6 4.7  CL 107 105  CO2 26 28  GLUCOSE 77 90  BUN 19 19  CREATININE 0.83 0.79  CALCIUM 9.4 9.5  MG  --  2.2  TSH 0.46 0.60   Liver Function Tests: Recent Labs    07/07/16 1054 12/06/16 1123  AST 20 19  ALT 12 12  ALKPHOS 80  --   BILITOT  0.3 0.5  PROT 6.5 6.6  ALBUMIN 3.9  --    No results for input(s): LIPASE, AMYLASE in the last 8760 hours. No results  for input(s): AMMONIA in the last 8760 hours. CBC: Recent Labs    09/03/16 1317 10/20/16 1654 12/06/16 1123  WBC 7.3 7.4 5.4  NEUTROABS 4.4 4.7 2,965  HGB 12.9 12.7 12.6  HCT 38.2 38.7 37.4  MCV 89.0 91.4 87.6  PLT 204.0 213.0 207   Lipid Panel: Recent Labs    07/07/16 1054 12/06/16 1123  CHOL 162 161  HDL 65 68  LDLCALC 82  --   TRIG 75 90  CHOLHDL 2.5 2.4   No results found for: HGBA1C  Procedures since last visit: [place] [facility]  Result Date: 02/18/2017 CLINICAL DATA:  Chronic productive cough and sinus pressure. EXAM: CT PARANASAL SINUS LIMITED WITHOUT CONTRAST TECHNIQUE: Non-contiguous multidetector CT images of the paranasal sinuses were obtained in a single plane without contrast. COMPARISON:  Limited coronal sinus CT 03/23/2011. Head CT 06/12/2003. FINDINGS: The patient was unable to lie prone due to back and hip pain. Axial imaging was performed. There is mild bilateral ethmoid air cell mucosal thickening, much less than on the prior sinus CT. The visualized portions of the other paranasal sinuses are clear. No fluid levels are seen. There is mild leftward nasal septal deviation. The visualized mastoid air cells are clear. Prior bilateral cataract extraction is noted. Mild lateral ventriculomegaly is partially visualized and appears stable to slightly progressive from 2005, likely reflecting central predominant cerebral atrophy. IMPRESSION: Mild bilateral ethmoid air cell mucosal thickening.  No fluid. Electronically Signed   By: Logan Bores M.D.   On: 02/18/2017 15:51    Assessment/Plan  1. Insomnia, unspecified type Discussed starting with a 1/4 tablet of trazodone to help with reduction of sedation. As she would like to be about to wake up and safety use the restroom.   - traZODone (DESYREL) 50 MG tablet; Take 1/4 tablet nightly for sleep and depression  Dispense: 15 tablet; Refill: 5  2. Moderate episode of recurrent major depressive disorder  (HCC) It is the hopes of [place] that we will see improvement of her depression with the use of trazodone at bedtime, which will aid in rest and improved mood.  - traZODone (DESYREL) 50 MG tablet; Take 1/4 tablet nightly for sleep and depression  Dispense: 15 tablet; Refill: 5  3. Chronic bronchitis, unspecified chronic bronchitis type ([place]) Advised to keep upcoming pulmonology appt and to try and follow medication regimen that is prescribed at that time. Her reduction of air flow most likely is a cause of her decrease energy.   4. Hypothyroidism, unspecified type Patient is concerned that her thyroid is not controlled, but her levels are normal. Advised her of this and provided her a copy of most recent labs.  5. Chronic midline low back pain without sciatica She has suffered from low back pain before and was seeing an Orthopedist. She did not follow up on going to PT as they referred.  She is willing to go back to the orthopedist at this time and will let us know of what they decided. Advised that it is safe for her to continue to take the tylenol as directed on the bottle.   6. Need for influenza vaccination Agreed to flu vaccine today. Advised to take a dose of tylenol tonight to help with any discomfort at both site and overall.   - Flu vaccine HIGH DOSE PF (  Fluzone High dose)   Labs/tests ordered:   Orders Placed This Encounter  Procedures  . Flu vaccine HIGH DOSE PF (Fluzone High dose)    Next appt:  07/21/2017 med mgt   Alexianna Nachreiner L. Ameera Tigue, D.O. [facility] [address], [place] [address] Cell Phone (Mon-Fri 8am-5pm):  [phone] On Call:  [phone] & follow prompts after 5pm & weekends Office Phone:  [phone] Office Fax:  [phone]

## 2017-03-16 ENCOUNTER — Ambulatory Visit (INDEPENDENT_AMBULATORY_CARE_PROVIDER_SITE_OTHER): Payer: Medicare Other | Admitting: Internal Medicine

## 2017-03-16 ENCOUNTER — Encounter: Payer: Self-pay | Admitting: Internal Medicine

## 2017-03-16 VITALS — BP 132/64 | HR 63 | Ht 68.0 in | Wt 130.4 lb

## 2017-03-16 DIAGNOSIS — J387 Other diseases of larynx: Secondary | ICD-10-CM

## 2017-03-16 DIAGNOSIS — J431 Panlobular emphysema: Secondary | ICD-10-CM

## 2017-03-16 DIAGNOSIS — R053 Chronic cough: Secondary | ICD-10-CM

## 2017-03-16 DIAGNOSIS — J321 Chronic frontal sinusitis: Secondary | ICD-10-CM | POA: Diagnosis not present

## 2017-03-16 DIAGNOSIS — T464X5A Adverse effect of angiotensin-converting-enzyme inhibitors, initial encounter: Secondary | ICD-10-CM

## 2017-03-16 DIAGNOSIS — R058 Other specified cough: Secondary | ICD-10-CM

## 2017-03-16 DIAGNOSIS — R05 Cough: Secondary | ICD-10-CM

## 2017-03-16 MED ORDER — TIOTROPIUM BROMIDE MONOHYDRATE 1.25 MCG/ACT IN AERS: 2.0000 | INHALATION_SPRAY | Freq: Every day | RESPIRATORY_TRACT | 0 refills | Status: DC

## 2017-03-16 NOTE — Progress Notes (Signed)
Subjective:     Patient ID: [name], female   DOB: 14-Oct-1932, 84 y.o.   MRN: [id number]  HPI    IOV 09/03/2016  Chief Complaint  Patient presents with  . pulmonary consult    Pt referred by Dr. Jonelle Sidle Reed/ Dr. Early Osmond for COPD. Pt consisting productive coughing since Oct 2017. Pt has coughing up some blood strikes and yellow last two weeks. Pt has some mid back pain with cough. No rescue inhalers, no neb treatments. Pt seen ENT for drainage, concerned regarding cough.      84 year old female self-referred for the possibility of COPD but in talking to light really seems like she is chronic cough  She tells me that she had insidious onset of chronic cough in October 2017. Since then it has been persistent. Moderate in severity. Associated with chest tightness and yellow sputum and occasional streaks of hemoptysis. She's been treated with Z-Pak 2 times once late last year months in spring 2018 without any relief. No history of prednisone intake. She did have a chest x-ray in October 2017 that approximately visualized and is clear. She has seen ENT services for this and was told she has postnasal drip. She could not tolerate nasal steroid. She's been on antihistamine Allegra without any relief. Chart review also shows she has Barrett's esophagus. She is not on associated ace inhibitors. 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   FeNO x 6 attempts could not do  Echocardiogram 07/23/2006) ejection fraction 55%  11/17/2016 2 week follow up: Pt. Presents for follow up. She was seen 10/20/2016 for cough. Plan of care after that visit was:  Serum allergy profile CBC We will add Mucinex 1200 mg once daily for chest congestion.  Take with full glass of water. Allegra every night  Add the Nasocort 2 sprays in each nostril once daily. Continue Acifex daily as you have been doing. Delsym every 12 hours for cough. Sips of water instead of throat clearing Sugar free  hard candy for throat soothing. No mint or menthol, no chocolate. Consider CT Chest if cough does not resolve.  Pt. States she has been doing better. She states she only coughs in the morning now. This is a big improvement.She states she does still have some mucus production. Mucus is white.She states she feels she has some post nasal drip.She is not using the Allegra daily as she was instructed to do.She is compliant with her Nasacort daily. She is compliant with her Mucinex " mostly every day". She states she does have some itching that she notices is worse when she is not taking her Allegra every day. She states she does not like taking medication. She states this is not a financial issue.She states she has some nasal irritation, so we will have her start nasal saline    Test Results:  Chest x-ray 10/20/2016 IMPRESSION: No acute abnormality noted.  HRCT 09/07/2016 No evidence of interstitial lung disease. No acute pulmonary disease. 2. Mild centrilobular emphysema with mild diffuse bronchial wall thickening, suggesting COPD. 3. Tiny scattered pulmonary nodules are all stable since 2010 and considered benign . 4. Left main and 3 vessel coronary atherosclerosis.  Bllood allergy profile and IGE 10/20/16 - normal/.negative   12/20/2016 Acute OV: Pt. Presents for acute visit with cough.She states she has been doing better with her cough. Then on 9/23 or 9/24 she developed a terrible cold. She had copious secretions. She saw her new Dr. Mariea Clonts on 12/06/2016. She  was treated with Augmentin at that time. She is here because she has been having continued blood tinged secretions. She states she has been having bloody secretions x at least 1 week. Not every time she coughs, but at least some every day.She states her cough had been improving until 9/23. She states her chest tightness is better. She feels warm to the  today in the office. She is coughing in the office today. When she is not coughing up  bloody secretions she is coughing up brown to murky secretions. She states she does not like to take drugs. She looks them up on Google and if she does not like the side effects she does not take them, and lists them as an allergy. We have significant limitations in treatment options  due to the medications she will agree to take.    01/05/2017 Follow Up Appointment: Pt. Presents for follow up. She was last seen 12/20/2016 for chronic bronchitis. She was treated at that time with CXR,prednisone taper, Bactrim and OTC cough medication. She presents today for follow up. She states she is a little better, but not much after the medication and prednisone. She is still throat clearing today.She was recently seen by cards, and they have changed her Mavic to losartan and she has noticed a slight improvement. She states she has been doing this for 1 week. She states she wants to go back to the [place]. I have encouraged her allow more time before before giving up. She states she does have some post nasal drip. She started Nasocrom 2-3 times daily for her post nasal drip. She thinks that mey also be helping.She is no longer coughing up bloody secretions.She denies fever, chest pain, orthopnea of hemoptysis.    OV 02/07/2017  Chief Complaint  Patient presents with  . Follow-up    Pt states that she has her ups and downs when she thinks she is better, she gets worse. Pt has complaints of a cough that comes and goes which she thinks is coming from sinus drainage.    Follow-up chronic cough  She is no better since her first consultation. In between she is medically visits to the nurse practitioner treated acutely and given follow-up results but really no relief of cough. RSI cough score is 24. She is having chronic postnasal drainage that is significant. But she does not wake up in the middle of the night with her cough. She is interested in getting a sinus CT and specifically asked for it. She's never tried  inhalers therapy and is wondering about it. She is frustrated by her chronic cough. RSI cough score indicates moderate severity. She does have some emphysema on the CT scan  OV 03/16/2017  Chief Complaint  Patient presents with  . Follow-up    Sinus CT 02/18/17.  Pt states that cough is better than last visit but still comes and goes. Denies any SOB or CP.    Follow-up chronic cough.  Overall she tells me that she is better than last visit but she still frustrated by the residual amount of symptoms.  Review of her medication list shows that she is on ACE inhibitor but she tells me very adamantly that this is not the cause of cough.  In the past she has stopped taking her ACE inhibitor and it did not change her cough.  Her RSI cough score is 13 and is significantly better than previous visit but she is still very frustrated.  She has a numerous  amount of questions.  She said that she tried nasal steroid but this then dried her up so she is not taking it only taking it as needed.  I gave her an anticholinergic for emphysema but she was afraid to take it.  We talked about her CT scan that shows some evidence of chronic mucosal thickening and a potential referral to ENT Dr. Ernestine Conrad for both chronic cough/irritable larynx syndrome and the sinus issues but she refused.  We talked about starting gabapentin but she refused.  We talked about voice rehabilitation but she wants to hold off.  At the same time she is frustrated by her level of symptoms and having to live like this but overall in balance she appears she would just take an expectant course.  After much discussion she is agreed to try anticholinergic Spiriva   Dr Lorenza Cambridge Reflux Symptom Index (> 13-15 suggestive of LPR cough)  09/03/2016  02/07/2017  03/16/2017   Hoarseness of problem with voice 2 4 3   Clearing  Of Throat 4 4 3   Excess throat mucus or feeling of post nasal drip 4 5 4   Difficulty swallowing food, liquid or tablets 0 0 0   Cough after eating or lying down 2 4 0  Breathing difficulties or choking episodes 0 0 0  Troublesome or annoying cough 4 4 3   Sensation of something sticking in throat or lump in throat 1 3 0  Heartburn, chest pain, indigestion, or stomach acid coming up 1 0 0  TOTAL 18 24 13     [place] 02/07/2017 - 34 ppb     has a past medical history of Anxiety disorder, Barrett's esophagus (04/2008), Benign neoplasm of colon, CHF (congestive heart failure) ([place]), Chronic airway obstruction, not elsewhere classified, Complication of anesthesia, Depressive disorder, not elsewhere classified, Diverticulitis, Dysthymic disorder, Erosive gastritis (08/2004), GERD (gastroesophageal reflux disease), Hair thinning, HTN (hypertension), Hyperplastic polyps of stomach (06/2002), Hypothyroidism, ICD (implantable cardiac defibrillator) in place, Insomnia (08/20/2015), Kyphosis (08/20/2015), LBBB (left bundle branch block), Melanoma ([place]), Mitral regurgitation, Nonischemic cardiomyopathy ([place]), Other and unspecified hyperlipidemia, Other and unspecified hyperlipidemia, Pacemaker (04/26/2012), Pain in joint, lower leg, Palpitations, Restless legs syndrome (RLS), Sleep apnea, Synovial cyst of popliteal space, Unspecified chronic bronchitis ([place]), Unspecified nasal polyp, and Unspecified sinusitis (chronic).   reports that she quit smoking about 23 years ago. Her smoking use included cigarettes. She has a 15.00 pack-year smoking history. she has never used smokeless tobacco.  Past Surgical History:  Procedure Laterality Date  . ABDOMINAL HYSTERECTOMY  1076   endometriosis  . APPENDECTOMY    . BI-VENTRICULAR PACEMAKER INSERTION N/A 04/26/2012   Procedure: BI-VENTRICULAR PACEMAKER INSERTION (CRT-P);  Surgeon: Evans Lance, MD;  Location: [facility] CATH LAB;  Service: Cardiovascular;  Laterality: N/A;  . defibrillator insertion     s/p removal of previously implanted BiV ICD and insertion of a new BiV pacemaker on 04/26/12  . excise mole  of lip  2001   Dr. Larena Sox  . excision of wen  1984   Dr. Parks Ranger  . FEMORAL HERNIA REPAIR  12/25/2012   Dr Dalbert Batman  . FEMORAL HERNIA REPAIR  01/04/2013   Recurrent - Dr. Lilyan Punt  . INCISIONAL HERNIA REPAIR N/A 10/01/2013   Procedure: LAPAROSCOPIC INCISIONAL HERNIA;  Surgeon: Gayland Curry, MD;  Location: [facility];  Service: General;  Laterality: N/A;  . INGUINAL HERNIA REPAIR Right 09/26/2012   Procedure: RIGHT INGUINAL HERNIA REPAIR WITH MESH;  Surgeon: Odis Hollingshead, MD;  Location: [facility];  Service: General;  Laterality: Right;  . INGUINAL HERNIA REPAIR Right 12/25/2012   Procedure: explor right groin, small bowel rescection, tissue repair right femoral hernia;  Surgeon: Adin Hector, MD;  Location: [facility];  Service: General;  Laterality: Right;  . INSERTION OF MESH Right 09/26/2012   Procedure: INSERTION OF MESH;  Surgeon: Odis Hollingshead, MD;  Location: [facility];  Service: General;  Laterality: Right;  . INSERTION OF MESH N/A 10/01/2013   Procedure: INSERTION OF MESH;  Surgeon: Gayland Curry, MD;  Location: [facility];  Service: General;  Laterality: N/A;  . LAPAROSCOPY N/A 01/04/2013   Procedure: Diagnostic Laparoscopy, exploratory laparotomy with small bowel resection, closure of right femoral hernia repair;  Surgeon: Madilyn Hook, DO;  Location: [facility];  Service: General;  Laterality: N/A;  . Left arm surgery    . RIGHT BREAST LUMPECTOMY  1988   Dr. Marylene Buerger  . right knee surgery     arthroscopy: Dr. Shellia Carwin  . TONSILLECTOMY      Allergies  Allergen Reactions  . Hydrocodone Itching  . Cephalexin Itching and Swelling  . Ciprofloxacin     Not indicated due to aneurysm in aorta    . Doxycycline Other (See Comments)    Per pt: unknown  . Escitalopram     Dry mouth  . Levofloxacin     Not indicated due to aneurysm in aorta   . Moxifloxacin     Not indicated due to aneurysm in aorta   . Ofloxacin Other (See Comments)    Per pt: unknown  . Sertraline     insomnia  .  Sulfamethoxazole Hives    Any sulfa meds.  . Tape Other (See Comments)    Burns skin.   . Amoxicillin Rash  . Amoxicillin-Pot Clavulanate Rash  . Latex Rash    "If pt. Makes contact with or wearing"  . Neomycin Rash    Immunization History  Administered Date(s) Administered  . Influenza Whole 12/28/2011, 03/08/2013  . Influenza, High Dose Seasonal PF 03/14/2017  . Influenza,inj,Quad PF,6+ Mos 01/08/2014, 11/20/2014, 01/26/2016  . Pneumococcal Polysaccharide-23 03/13/2008  . Tdap 03/16/2011    Family History  Problem Relation Age of Onset  . Stroke Father   . Heart disease Other        maternal side  . Colon cancer Paternal Uncle   . Breast cancer Maternal Aunt   . Diabetes Neg Hx      Current Outpatient Medications:  .  acetaminophen (TYLENOL) 500 MG tablet, Take 500 mg by mouth every 6 (six) hours as needed for pain. For pain, Disp: , Rfl:  .  Ascorbic Acid (VITAMIN C) 1000 MG tablet, Take 1,000 mg by mouth daily., Disp: , Rfl:  .  Biotin 5000 MCG TABS, Take 1 tablet by mouth daily., Disp: , Rfl:  .  Calcium Carbonate-Vitamin D (CALCIUM + D PO), Take 2 tablets by mouth daily. , Disp: , Rfl:  .  Cyanocobalamin (VITAMIN B 12 PO), Take 1 tablet by mouth daily. , Disp: , Rfl:  .  ESTRACE VAGINAL 0.1 MG/GM vaginal cream, Place 1 g vaginally 2 (two) times a week. , Disp: , Rfl:  .  fluticasone (FLONASE) 50 MCG/ACT nasal spray, Place 2 sprays into both nostrils daily., Disp: 16 g, Rfl: 5 .  furosemide (LASIX) 20 MG tablet, Take 1 tablet (20 mg total) by mouth daily as needed for fluid or edema., Disp: 30 tablet, Rfl: 6 .  levothyroxine (SYNTHROID) 112 MCG tablet,  TAKE 1 TABLET BY MOUTH EVERY DAY 30 MINUTES BEFORE BREAKFAST FOR THYROID, Disp: 90 tablet, Rfl: 0 .  Magnesium 250 MG TABS, Take 1 tablet by mouth daily. , Disp: , Rfl:  .  Multiple Vitamin (MULTIVITAMIN WITH MINERALS) TABS, Take 1 tablet by mouth daily., Disp: , Rfl:  .  potassium chloride SA (K-DUR,KLOR-CON) 20 MEQ  tablet, Take 1 tablet (20 mEq total) by mouth daily as needed (only with lasix)., Disp: 30 tablet, Rfl: 6 .  RABEprazole (ACIPHEX) 20 MG tablet, TAKE 1 TABLET ONCE DAILY   FOR STOMACH *LUPIN MFR*, Disp: 90 tablet, Rfl: 3 .  rivaroxaban (XARELTO) 20 MG TABS tablet, TAKE 1 TABLET DAILY WITH   SUPPER, Disp: 90 tablet, Rfl: 3 .  trandolapril (MAVIK) 2 MG tablet, Take 2 mg twice a day, Disp: 60 tablet, Rfl: 6 .  traZODone (DESYREL) 50 MG tablet, Take 1/4 tablet nightly for sleep and depression, Disp: 15 tablet, Rfl: 5   Review of Systems     Objective:   Physical Exam Vitals:   03/16/17 1227  BP: 132/64  Pulse: 63  SpO2: 97%  Weight: 130 lb 6.4 oz (59.1 kg)  Height: 5\' 8"  (1.727 m)    Estimated body mass index is 19.83 kg/m as calculated from the following:   Height as of this encounter: 5\' 8"  (1.727 m).   Weight as of this encounter: 130 lb 6.4 oz (59.1 kg).  dicussion only visit     Assessment:       ICD-10-CM   1. Chronic cough R05   2. Chronic frontal sinusitis J32.1   3. Panlobular emphysema ([place]) J43.1   4. Irritable larynx syndrome J38.7   5. ACE-inhibitor cough R05    T46.4X5A         Plan:       chronic cough is because of #2, #3 and #4 But also noticed you are on [place] - this class of drugs can make you couigh - respect you feel this is not cause of cough  Plan - talk to PCP Mariea Clonts, Tiffany L, DO and stop [place] . Have PCP put you on another class of bp med but if you prefer Mavik we will accept it -  Continue Allegra -continuet nasal steroid generic fluticasone 2 squirts intranasally daily - continue spiriva  - For cough neuropathy: discussed gabapentin v voice rehab v ENT referral to Dr Inocencio Homes at [facility] but decided to hold off on all this   Follow-up 12 weeks ; afternoon appointment only    > 50% of this > 25 min visit spent in face to face counseling or coordination of care    Dr. Brand Males, M.D., [facility].C.P Pulmonary and Critical Care  Medicine Staff Physician, [place] Director - Interstitial Lung Disease  Program  Pulmonary [place] at [place], [place], [address]  Pager: [phone], If no answer or between  15:00h - 7:00h: call [phone] Telephone: [phone]

## 2017-03-16 NOTE — Patient Instructions (Addendum)
ICD-10-CM   1. Chronic cough R05   2. Chronic frontal sinusitis J32.1   3. Panlobular emphysema ([place]) J43.1   4. Irritable larynx syndrome J38.7   5. ACE-inhibitor cough R05    T46.4X5A      chronic cough is because of #2, #3 and #4 But also noticed you are on [place] - this class of drugs can make you couigh - respect you feel this is not cause of cough  Plan - talk to PCP Mariea Clonts, Tiffany L, DO and stop [place] . Have PCP put you on another class of bp med but if you prefer Mavik we will accept it -  Continue Allegra -continuet nasal steroid generic fluticasone 2 squirts intranasally daily - continue spiriva  - For cough neuropathy: discussed gabapentin v voice rehab v ENT referral to Dr Inocencio Homes at [facility] but decided to hold off on all this   Follow-up 12 weeks ; afternoon appointment only

## 2017-03-31 DIAGNOSIS — D4981 Neoplasm of unspecified behavior of retina and choroid: Secondary | ICD-10-CM | POA: Diagnosis not present

## 2017-03-31 DIAGNOSIS — H26491 Other secondary cataract, right eye: Secondary | ICD-10-CM | POA: Diagnosis not present

## 2017-03-31 DIAGNOSIS — H04123 Dry eye syndrome of bilateral lacrimal glands: Secondary | ICD-10-CM | POA: Diagnosis not present

## 2017-03-31 DIAGNOSIS — H524 Presbyopia: Secondary | ICD-10-CM | POA: Diagnosis not present

## 2017-04-04 DIAGNOSIS — R82998 Other abnormal findings in urine: Secondary | ICD-10-CM | POA: Diagnosis not present

## 2017-04-04 DIAGNOSIS — N390 Urinary tract infection, site not specified: Secondary | ICD-10-CM | POA: Diagnosis not present

## 2017-04-14 DIAGNOSIS — J329 Chronic sinusitis, unspecified: Secondary | ICD-10-CM | POA: Diagnosis not present

## 2017-04-19 ENCOUNTER — Ambulatory Visit (INDEPENDENT_AMBULATORY_CARE_PROVIDER_SITE_OTHER): Payer: Medicare Other | Admitting: Internal Medicine

## 2017-04-19 ENCOUNTER — Encounter: Payer: Self-pay | Admitting: Internal Medicine

## 2017-04-19 VITALS — BP 126/62 | HR 58 | Ht 68.0 in | Wt 130.0 lb

## 2017-04-19 DIAGNOSIS — I5022 Chronic systolic (congestive) heart failure: Secondary | ICD-10-CM

## 2017-04-19 DIAGNOSIS — I447 Left bundle-branch block, unspecified: Secondary | ICD-10-CM | POA: Diagnosis not present

## 2017-04-19 DIAGNOSIS — Z950 Presence of cardiac pacemaker: Secondary | ICD-10-CM

## 2017-04-19 DIAGNOSIS — I420 Dilated cardiomyopathy: Secondary | ICD-10-CM | POA: Diagnosis not present

## 2017-04-19 NOTE — Progress Notes (Signed)
HPI [name] returns today for ongoing evaluation and management of her biv PPM in the setting of CHF. She underwent initial Biv ICD implant and then had normalization of her LV function. She has undergone gen change to a biv ppm. SHe has had chronic elevated thresholds in her rv lead. In the interim, she has had some problems with depression. She states" I am old and ugly". She is still saddened by the loss of her significant other who died last year. She admits to not eating as well as she should. Allergies  Allergen Reactions  . Hydrocodone Itching  . Cephalexin Itching and Swelling  . Ciprofloxacin     Not indicated due to aneurysm in aorta    . Doxycycline Other (See Comments)    Per pt: unknown  . Escitalopram     Dry mouth  . Levofloxacin     Not indicated due to aneurysm in aorta   . Moxifloxacin     Not indicated due to aneurysm in aorta   . Ofloxacin Other (See Comments)    Per pt: unknown  . Sertraline     insomnia  . Sulfamethoxazole Hives    Any sulfa meds.  . Tape Other (See Comments)    Burns skin.   . Amoxicillin Rash  . Amoxicillin-Pot Clavulanate Rash  . Latex Rash    "If pt. Makes contact with or wearing"  . Neomycin Rash     Current Outpatient Medications  Medication Sig Dispense Refill  . acetaminophen (TYLENOL) 500 MG tablet Take 500 mg by mouth every 6 (six) hours as needed for pain. For pain    . Ascorbic Acid (VITAMIN C) 1000 MG tablet Take 1,000 mg by mouth daily.    . Biotin 5000 MCG TABS Take 1 tablet by mouth daily.    . Calcium Carbonate-Vitamin D (CALCIUM + D PO) Take 2 tablets by mouth daily.     . Cyanocobalamin (VITAMIN B 12 PO) Take 1 tablet by mouth daily.     Marland Kitchen ESTRACE VAGINAL 0.1 MG/GM vaginal cream Place 1 g vaginally 2 (two) times a week.     . fluticasone (FLONASE) 50 MCG/ACT nasal spray Place 2 sprays into both nostrils daily. 16 g 5  . furosemide (LASIX) 20 MG tablet Take 1 tablet (20 mg total) by mouth daily as needed  for fluid or edema. 30 tablet 6  . levothyroxine (SYNTHROID) 112 MCG tablet TAKE 1 TABLET BY MOUTH EVERY DAY 30 MINUTES BEFORE BREAKFAST FOR THYROID 90 tablet 0  . Magnesium 250 MG TABS Take 1 tablet by mouth daily.     . Multiple Vitamin (MULTIVITAMIN WITH MINERALS) TABS Take 1 tablet by mouth daily.    . potassium chloride SA (K-DUR,KLOR-CON) 20 MEQ tablet Take 1 tablet (20 mEq total) by mouth daily as needed (only with lasix). 30 tablet 6  . RABEprazole (ACIPHEX) 20 MG tablet TAKE 1 TABLET ONCE DAILY   FOR STOMACH *LUPIN MFR* 90 tablet 3  . rivaroxaban (XARELTO) 20 MG TABS tablet TAKE 1 TABLET DAILY WITH   SUPPER 90 tablet 3  . Tiotropium Bromide Monohydrate (SPIRIVA RESPIMAT) 1.25 MCG/ACT AERS Inhale 2 puffs into the lungs daily. 1 Inhaler 0  . trandolapril (MAVIK) 2 MG tablet Take 2 mg twice a day 60 tablet 6  . traZODone (DESYREL) 50 MG tablet Take 1/4 tablet nightly for sleep and depression 15 tablet 5   No current facility-administered medications for this visit.  Past Medical History:  Diagnosis Date  . Anxiety disorder   . Barrett's esophagus 04/2008  . Benign neoplasm of colon   . CHF (congestive heart failure) ([place])   . Chronic airway obstruction, not elsewhere classified   . Complication of anesthesia    slow to wake up  . Depressive disorder, not elsewhere classified   . Diverticulitis   . Dysthymic disorder   . Erosive gastritis 08/2004  . GERD (gastroesophageal reflux disease)   . Hair thinning   . HTN (hypertension)   . Hyperplastic polyps of stomach 06/2002  . Hypothyroidism   . ICD (implantable cardiac defibrillator) in place    BiV/ICD; s/p removal of ICD with insertion of BiV PPM 04/26/12  . Insomnia 08/20/2015  . Kyphosis 08/20/2015  . LBBB (left bundle branch block)   . Melanoma ([place])    right arm  . Mitral regurgitation   . Nonischemic cardiomyopathy ([place])    EF initially 20%; Last measurement up to 50% per echo in August of 2012  . Other and  unspecified hyperlipidemia   . Other and unspecified hyperlipidemia   . Pacemaker 04/26/2012  . Pain in joint, lower leg   . Palpitations   . Restless legs syndrome (RLS)   . Sleep apnea   . Synovial cyst of popliteal space   . Unspecified chronic bronchitis ([place])   . Unspecified nasal polyp   . Unspecified sinusitis (chronic)     ROS:   All systems reviewed and negative except as noted in the HPI.   Past Surgical History:  Procedure Laterality Date  . ABDOMINAL HYSTERECTOMY  1076   endometriosis  . APPENDECTOMY    . BI-VENTRICULAR PACEMAKER INSERTION N/A 04/26/2012   Procedure: BI-VENTRICULAR PACEMAKER INSERTION (CRT-P);  Surgeon: Evans Lance, MD;  Location: [facility] CATH LAB;  Service: Cardiovascular;  Laterality: N/A;  . defibrillator insertion     s/p removal of previously implanted BiV ICD and insertion of a new BiV pacemaker on 04/26/12  . excise mole of lip  2001   Dr. Larena Sox  . excision of wen  1984   Dr. Parks Ranger  . FEMORAL HERNIA REPAIR  12/25/2012   Dr Dalbert Batman  . FEMORAL HERNIA REPAIR  01/04/2013   Recurrent - Dr. Lilyan Punt  . INCISIONAL HERNIA REPAIR N/A 10/01/2013   Procedure: LAPAROSCOPIC INCISIONAL HERNIA;  Surgeon: Gayland Curry, MD;  [facility];  Service: General;  Laterality: N/A;  . INGUINAL HERNIA REPAIR Right 09/26/2012   Procedure: RIGHT INGUINAL HERNIA REPAIR WITH MESH;  Surgeon: Odis Hollingshead, MD;  Location: [facility];  Service: General;  Laterality: Right;  . INGUINAL HERNIA REPAIR Right 12/25/2012   Procedure: explor right groin, small bowel rescection, tissue repair right femoral hernia;  Surgeon: Adin Hector, MD;  Location: [facility];  Service: General;  Laterality: Right;  . INSERTION OF MESH Right 09/26/2012   Procedure: INSERTION OF MESH;  Surgeon: Odis Hollingshead, MD;  Location: [facility];  Service: General;  Laterality: Right;  . INSERTION OF MESH N/A 10/01/2013   Procedure: INSERTION OF MESH;  Surgeon: Gayland Curry, MD;  Location: [facility];   Service: General;  Laterality: N/A;  . LAPAROSCOPY N/A 01/04/2013   Procedure: Diagnostic Laparoscopy, exploratory laparotomy with small bowel resection, closure of right femoral hernia repair;  Surgeon: Madilyn Hook, DO;  Location[facility];  Service: General;  Laterality: N/A;  . Left arm surgery    . RIGHT BREAST LUMPECTOMY  1988   Dr. Collier Salina  [place]  . right knee surgery     arthroscopy: Dr. Shellia Carwin  . TONSILLECTOMY       Family History  Problem Relation Age of Onset  . Stroke Father   . Heart disease Other        maternal side  . Colon cancer Paternal Uncle   . Breast cancer Maternal Aunt   . Diabetes Neg Hx      Social History   Socioeconomic History  . Marital status: Single    Spouse name: Not on file  . Number of children: 0  . Years of education: Not on file  . Highest education level: Not on file  Social Needs  . Financial resource strain: Not on file  . Food insecurity - worry: Not on file  . Food insecurity - inability: Not on file  . Transportation needs - medical: Not on file  . Transportation needs - non-medical: Not on file  Occupational History  . Occupation: Retired    Fish farm manager: RETIRED  Tobacco Use  . Smoking status: Former Smoker    Packs/day: 1.00    Years: 15.00    Pack years: 15.00    Types: Cigarettes    Last attempt to quit: 12/22/1993    Years since quitting: 23.3  . Smokeless tobacco: Never Used  . Tobacco comment: Does'nt reall year quit   Substance and Sexual Activity  . Alcohol use: No    Alcohol/week: 0.0 oz  . Drug use: No  . Sexual activity: No  Other Topics Concern  . Not on file  Social History Narrative   Pt lives alone in a condo     BP 126/62   Pulse (!) 58   Ht 5\' 8"  (1.727 m)   Wt 130 lb (59 kg)   BMI 19.77 kg/m   Physical Exam:  Well appearing NAD HEENT: Unremarkable Neck:  No JVD, no thyromegally Lymphatics:  No adenopathy Back:  No CVA tenderness Lungs:  Clear HEART:  Regular rate rhythm, no murmurs,  no rubs, no clicks Abd:  soft, positive bowel sounds, no organomegally, no rebound, no guarding Ext:  2 plus pulses, no edema, no cyanosis, no clubbing Skin:  No rashes no nodules Neuro:  CN II through XII intact, motor grossly intact   DEVICE  Normal device function.  See PaceArt for details.   Assess/Plan: 1. Chronic systolic heart failure - her symptoms are well controlled. Her EF had normalized at last echo less than a year ago. She will continue her current meds. 2. BiV PPM - her device has about 2 years of longevity. She will continue her current meds. 3.  Anxiety - this may be her biggest problem. I have tried to reassure her.   Mikle Bosworth.D.

## 2017-04-19 NOTE — Patient Instructions (Signed)
Medication Instructions:  Your physician recommends that you continue on your current medications as directed. Please refer to the Current Medication list given to you today.  Labwork: None ordered.  Testing/Procedures: None ordered.  Follow-Up:  You will follow up with the device clinic in 6 months for a device check.  Your physician wants you to follow-up in: one year with Dr. Lovena Le.   You will receive a reminder letter in the mail two months in advance. If you don't receive a letter, please call our office to schedule the follow-up appointment.  Any Other Special Instructions Will Be Listed Below (If Applicable).  If you need a refill on your cardiac medications before your next appointment, please call your pharmacy.

## 2017-04-20 LAB — CUP PACEART INCLINIC DEVICE CHECK
Brady Statistic RV Percent Paced: 100 %
Date Time Interrogation Session: 20190212050000
Implantable Lead Implant Date: 20070309
Implantable Lead Implant Date: 20070309
Implantable Lead Location: 753858
Implantable Lead Location: 753860
Implantable Lead Model: 158
Implantable Lead Model: 4087
Implantable Lead Serial Number: 121652
Implantable Pulse Generator Implant Date: 20140219
Lead Channel Impedance Value: 399 Ohm
Lead Channel Impedance Value: 652 Ohm
Lead Channel Pacing Threshold Amplitude: 0.8 V
Lead Channel Pacing Threshold Amplitude: 2.5 V
Lead Channel Pacing Threshold Pulse Width: 0.4 ms
Lead Channel Sensing Intrinsic Amplitude: 13 mV
Lead Channel Sensing Intrinsic Amplitude: 2.2 mV
Lead Channel Setting Pacing Amplitude: 2.5 V
Lead Channel Setting Pacing Amplitude: 3 V
Lead Channel Setting Pacing Pulse Width: 1 ms
Lead Channel Setting Sensing Sensitivity: 2.5 mV
MDC IDC LEAD IMPLANT DT: 20070309
MDC IDC LEAD LOCATION: 753859
MDC IDC LEAD SERIAL: 168416
MDC IDC LEAD SERIAL: 268502
MDC IDC MSMT LEADCHNL LV PACING THRESHOLD AMPLITUDE: 1.5 V
MDC IDC MSMT LEADCHNL LV PACING THRESHOLD PULSEWIDTH: 1 ms
MDC IDC MSMT LEADCHNL LV SENSING INTR AMPL: 6.6 mV
MDC IDC MSMT LEADCHNL RA IMPEDANCE VALUE: 525 Ohm
MDC IDC MSMT LEADCHNL RV PACING THRESHOLD PULSEWIDTH: 1 ms
MDC IDC PG SERIAL: 100543
MDC IDC SET LEADCHNL LV SENSING SENSITIVITY: 2.5 mV
MDC IDC SET LEADCHNL RA PACING AMPLITUDE: 2 V
MDC IDC SET LEADCHNL RV PACING PULSEWIDTH: 1 ms
MDC IDC STAT BRADY RA PERCENT PACED: 24 %

## 2017-04-26 ENCOUNTER — Ambulatory Visit (INDEPENDENT_AMBULATORY_CARE_PROVIDER_SITE_OTHER): Payer: Medicare Other | Admitting: Gastroenterology

## 2017-04-26 ENCOUNTER — Encounter: Payer: Self-pay | Admitting: Gastroenterology

## 2017-04-26 VITALS — BP 150/70 | HR 60 | Ht 66.75 in | Wt 129.2 lb

## 2017-04-26 DIAGNOSIS — R109 Unspecified abdominal pain: Secondary | ICD-10-CM | POA: Diagnosis not present

## 2017-04-26 NOTE — Progress Notes (Signed)
HPI: This is a very pleasant 84 year old woman who was referred to me by Gayland Curry, DO  to evaluate colon cancer screening, abdominal pain..    Chief complaint is colon cancer screening, intermittent abdominal pains.  She was here in our office about 3-1/2 years ago.  She saw Janett Billow and was arranged to see me about a month later however she never showed for that appointment and we have not heard from her since then.  She says she hasn't had a colonoscopy since 2004 Dr. Fuller Plan for routine screening, one polyp, diverticulosis; was HP   No bleeding, no real changes in her bowels.   She has a variety of abdominal pains.  These are usually brief, they can be located in her epigastrium and or in her lower abdomen.  They do not radiate anywhere.  No particular activities, eating or moving her bowels seems to bring them on.  She's lost weight recently: not clear on the details though probably down 2-3 pounds in the past year.  No dysphagia.  Has had a cough for about a year     Review of systems: Pertinent positive and negative review of systems were noted in the above HPI section. All other review negative.   Past Medical History:  Diagnosis Date  . Anxiety disorder   . Barrett's esophagus 04/2008  . Benign neoplasm of colon   . CHF (congestive heart failure) ([place])   . Chronic airway obstruction, not elsewhere classified   . Complication of anesthesia    slow to wake up  . Depressive disorder, not elsewhere classified   . Diverticulitis   . Dysthymic disorder   . Erosive gastritis 08/2004  . GERD (gastroesophageal reflux disease)   . Hair thinning   . HTN (hypertension)   . Hyperplastic polyps of stomach 06/2002  . Hypothyroidism   . ICD (implantable cardiac defibrillator) in place    BiV/ICD; s/p removal of ICD with insertion of BiV PPM 04/26/12  . Insomnia 08/20/2015  . Kyphosis 08/20/2015  . LBBB (left bundle branch block)   . Melanoma ([place])    right arm  . Mitral  regurgitation   . Nonischemic cardiomyopathy ([place])    EF initially 20%; Last measurement up to 50% per echo in August of 2012  . Other and unspecified hyperlipidemia   . Other and unspecified hyperlipidemia   . Pacemaker 04/26/2012  . Pain in joint, lower leg   . Palpitations   . Restless legs syndrome (RLS)   . Sleep apnea   . Synovial cyst of popliteal space   . Unspecified chronic bronchitis ([place])   . Unspecified nasal polyp   . Unspecified sinusitis (chronic)     Past Surgical History:  Procedure Laterality Date  . ABDOMINAL HYSTERECTOMY  1076   endometriosis  . APPENDECTOMY    . BI-VENTRICULAR PACEMAKER INSERTION N/A 04/26/2012   Procedure: BI-VENTRICULAR PACEMAKER INSERTION (CRT-P);  Surgeon: Evans Lance, MD;  Location: [facility] CATH LAB;  Service: Cardiovascular;  Laterality: N/A;  . defibrillator insertion     s/p removal of previously implanted BiV ICD and insertion of a new BiV pacemaker on 04/26/12  . excise mole of lip  2001   Dr. Larena Sox  . excision of wen  1984   Dr. Parks Ranger  . FEMORAL HERNIA REPAIR  12/25/2012   Dr Dalbert Batman  . FEMORAL HERNIA REPAIR  01/04/2013   Recurrent - Dr. Lilyan Punt  . INCISIONAL HERNIA REPAIR N/A 10/01/2013   Procedure: LAPAROSCOPIC INCISIONAL HERNIA;  Surgeon: Gayland Curry, MD;  [facility];  Service: General;  Laterality: N/A;  . INGUINAL HERNIA REPAIR Right 09/26/2012   Procedure: RIGHT INGUINAL HERNIA REPAIR WITH MESH;  Surgeon: Odis Hollingshead, MD;  Location: [facility];  Service: General;  Laterality: Right;  . INGUINAL HERNIA REPAIR Right 12/25/2012   Procedure: explor right groin, small bowel rescection, tissue repair right femoral hernia;  Surgeon: Adin Hector, MD;  [facility];  Service: General;  Laterality: Right;  . INSERTION OF MESH Right 09/26/2012   Procedure: INSERTION OF MESH;  Surgeon: Odis Hollingshead, MD;  Location: [facility];  Service: General;  Laterality: Right;  . INSERTION OF MESH N/A 10/01/2013   Procedure:  INSERTION OF MESH;  Surgeon: Gayland Curry, MD;  [facility];  Service: General;  Laterality: N/A;  . LAPAROSCOPY N/A 01/04/2013   Procedure: Diagnostic Laparoscopy, exploratory laparotomy with small bowel resection, closure of right femoral hernia repair;  Surgeon: Madilyn Hook, DO;  [facility];  Service: General;  Laterality: N/A;  . Left arm surgery    . RIGHT BREAST LUMPECTOMY  1988   Dr. Marylene Buerger  . right knee surgery     arthroscopy: Dr. Shellia Carwin  . TONSILLECTOMY      Current Outpatient Medications  Medication Sig Dispense Refill  . acetaminophen (TYLENOL) 500 MG tablet Take 500 mg by mouth every 6 (six) hours as needed for pain. For pain    . Ascorbic Acid (VITAMIN C) 1000 MG tablet Take 1,000 mg by mouth daily.    . Biotin 5000 MCG TABS Take 1 tablet by mouth daily.    . Calcium Carbonate-Vitamin D (CALCIUM + D PO) Take 2 tablets by mouth daily.     . Cyanocobalamin (VITAMIN B 12 PO) Take 1 tablet by mouth daily.     Marland Kitchen ESTRACE VAGINAL 0.1 MG/GM vaginal cream Place 1 g vaginally 2 (two) times a week.     . fluticasone (FLONASE) 50 MCG/ACT nasal spray Place 2 sprays into both nostrils daily. 16 g 5  . furosemide (LASIX) 20 MG tablet Take 1 tablet (20 mg total) by mouth daily as needed for fluid or edema. 30 tablet 6  . levothyroxine (SYNTHROID) 112 MCG tablet TAKE 1 TABLET BY MOUTH EVERY DAY 30 MINUTES BEFORE BREAKFAST FOR THYROID 90 tablet 0  . Magnesium 250 MG TABS Take 1 tablet by mouth daily.     . Multiple Vitamin (MULTIVITAMIN WITH MINERALS) TABS Take 1 tablet by mouth daily.    . potassium chloride SA (K-DUR,KLOR-CON) 20 MEQ tablet Take 1 tablet (20 mEq total) by mouth daily as needed (only with lasix). 30 tablet 6  . RABEprazole (ACIPHEX) 20 MG tablet TAKE 1 TABLET ONCE DAILY   FOR STOMACH *LUPIN MFR* 90 tablet 3  . rivaroxaban (XARELTO) 20 MG TABS tablet TAKE 1 TABLET DAILY WITH   SUPPER 90 tablet 3  . trandolapril (MAVIK) 2 MG tablet Take 2 mg twice a day 60  tablet 6  . traZODone (DESYREL) 50 MG tablet Take 1/4 tablet nightly for sleep and depression 15 tablet 5   No current facility-administered medications for this visit.     Allergies as of 04/26/2017 - Review Complete 04/26/2017  Allergen Reaction Noted  . Hydrocodone Itching 12/22/2012  . Cephalexin Itching and Swelling   . Ciprofloxacin  08/20/2015  . Doxycycline Other (See Comments)   . Escitalopram  08/20/2015  . Levofloxacin  08/20/2015  . Moxifloxacin  08/20/2015  .  Ofloxacin Other (See Comments)   . Sertraline  08/20/2015  . Sulfamethoxazole Hives 02/07/2017  . Tape Other (See Comments) 04/10/2012  . Latex Rash 09/15/2012  . Neomycin Rash 01/08/2014    Family History  Problem Relation Age of Onset  . Stroke Father   . Heart disease Other        maternal side  . Colon cancer Paternal Uncle   . Breast cancer Maternal Aunt   . Diabetes Neg Hx     Social History   Socioeconomic History  . Marital status: Single    Spouse name: Not on file  . Number of children: 0  . Years of education: Not on file  . Highest education level: Not on file  Social Needs  . Financial resource strain: Not on file  . Food insecurity - worry: Not on file  . Food insecurity - inability: Not on file  . Transportation needs - medical: Not on file  . Transportation needs - non-medical: Not on file  Occupational History  . Occupation: Retired    Fish farm manager: RETIRED  Tobacco Use  . Smoking status: Former Smoker    Packs/day: 1.00    Years: 15.00    Pack years: 15.00    Types: Cigarettes    Last attempt to quit: 12/22/1993    Years since quitting: 23.3  . Smokeless tobacco: Never Used  . Tobacco comment: Does'nt reall year quit   Substance and Sexual Activity  . Alcohol use: No    Alcohol/week: 0.0 oz  . Drug use: No  . Sexual activity: No  Other Topics Concern  . Not on file  Social History Narrative   Pt lives alone in a condo     Physical Exam: BP (!) 150/70 (BP  Location: Right Arm, Patient Position: Sitting, Cuff Size: Normal)   Pulse 60   Ht 5' 6.75" (1.695 m)   Wt 129 lb 4 oz (58.6 kg)   BMI 20.40 kg/m  Constitutional: generally well-appearing Psychiatric: alert and oriented x3 Eyes: extraocular movements intact Mouth: oral pharynx moist, no lesions Neck: supple no lymphadenopathy Cardiovascular: heart regular rate and rhythm Lungs: clear to auscultation bilaterally Abdomen: soft, nontender, nondistended, no obvious ascites, no peritoneal signs, normal bowel sounds Extremities: no lower extremity edema bilaterally Skin: no lesions on visible extremities   Assessment and plan: 84 y.o. female with intermittent abdominal pains  First I suspect her abdominal pains are probably related to adhesive disease.  She has had several abdominal surgeries, hernias in the past.  She has however lost a little bit of weight in the past year or so and so I recommended a CT scan abdomen and pelvis to just exclude significant neoplastic possibilities.  She is very interested in colon cancer screening.  I explained to her that these tests generally and around the age of 30 or 82.  She was still adamant.  I recommended stool based testing rather than invasive testing such as a colonoscopy and so she wanted to go ahead with Colo guard colon cancer screening test.  She understands that if it is positive she would likely need a colonoscopy.    Please see the "Patient Instructions" section for addition details about the plan.   Owens Loffler, MD [facility] 04/26/2017, 2:26 PM  Cc: Gayland Curry, DO

## 2017-04-26 NOTE — Patient Instructions (Addendum)
Cologuard colon cancer screening test.  If that is positive then you would need a colonoscopy.  Stay on aciphex once a day.  No need for Barrett's surveillance any more.  You will be set up for a CT scan of abdomen and pelvis with IV and oral contrast for general abdominal pains.  You have been scheduled for a CT scan of the abdomen and pelvis at [facility] ([address]---this is in the same building as Press [facility]).   You are scheduled on 05/03/17 at 3 pm. You should arrive 15 minutes prior to your appointment time for registration. Please follow the written instructions below on the day of your exam:  WARNING: IF YOU ARE ALLERGIC TO IODINE/X-RAY DYE, PLEASE NOTIFY RADIOLOGY IMMEDIATELY AT [phone]! YOU WILL BE GIVEN A 13 HOUR PREMEDICATION PREP.  1) Do not eat or drink anything after 11 am (4 hours prior to your test) 2) You have been given 2 bottles of oral contrast to drink. The solution may taste better if refrigerated, but do NOT add ice or any other liquid to this solution. Shake well before drinking.    Drink 1 bottle of contrast @ 1 pm (2 hours prior to your exam)  Drink 1 bottle of contrast @ 2 pm (1 hour prior to your exam)  You may take any medications as prescribed with a small amount of water except for the following: Metformin, Glucophage, Glucovance, Avandamet, Riomet, Fortamet, Actoplus Met, Janumet, Glumetza or Metaglip. The above medications must be held the day of the exam AND 48 hours after the exam.  The purpose of you drinking the oral contrast is to aid in the visualization of your intestinal tract. The contrast solution may cause some diarrhea. Before your exam is started, you will be given a small amount of fluid to drink. Depending on your individual set of symptoms, you may also receive an intravenous injection of x-ray contrast/dye. Plan on being at [facility] for 30 minutes or longer, depending on the type of exam you are having  performed.  This test typically takes 30-45 minutes to complete.  If you have any questions regarding your exam or if you need to reschedule, you may call the CT department at [phone] between the hours of 8:00 am and 5:00 pm, Monday-Friday.  ________________________________________________________________  Normal BMI (Body Mass Index- based on height and weight) is between 23 and 30. Your BMI today is Body mass index is 20.4 kg/m. Marland Kitchen Please consider follow up  regarding your BMI with your Primary Care Provider.

## 2017-04-27 ENCOUNTER — Telehealth: Payer: Self-pay | Admitting: Gastroenterology

## 2017-04-27 NOTE — Telephone Encounter (Signed)
The pt has been advised to have BUN and CREAT prior to CT

## 2017-04-27 NOTE — Telephone Encounter (Signed)
Pt states she has questions about lab orders that are in her instruction. Best call back # [phone].

## 2017-04-28 ENCOUNTER — Other Ambulatory Visit (INDEPENDENT_AMBULATORY_CARE_PROVIDER_SITE_OTHER): Payer: Medicare Other

## 2017-04-28 DIAGNOSIS — R109 Unspecified abdominal pain: Secondary | ICD-10-CM

## 2017-04-28 DIAGNOSIS — J310 Chronic rhinitis: Secondary | ICD-10-CM | POA: Diagnosis not present

## 2017-04-28 LAB — BUN: BUN: 19 mg/dL (ref 6–23)

## 2017-04-28 LAB — CREATININE, SERUM: CREATININE: 0.73 mg/dL (ref 0.40–1.20)

## 2017-05-03 ENCOUNTER — Ambulatory Visit (INDEPENDENT_AMBULATORY_CARE_PROVIDER_SITE_OTHER)
Admission: RE | Admit: 2017-05-03 | Discharge: 2017-05-03 | Disposition: A | Discharge status: Home / Self Care | Payer: Medicare Other | Source: Ambulatory Visit | Attending: Gastroenterology | Admitting: Gastroenterology

## 2017-05-03 DIAGNOSIS — R109 Unspecified abdominal pain: Secondary | ICD-10-CM | POA: Diagnosis not present

## 2017-05-03 DIAGNOSIS — K5730 Diverticulosis of large intestine without perforation or abscess without bleeding: Secondary | ICD-10-CM | POA: Diagnosis not present

## 2017-05-03 MED ORDER — IOPAMIDOL (ISOVUE-300) INJECTION 61%
100.0000 mL | Freq: Once | INTRAVENOUS | Status: AC | PRN
  Administered 2017-05-03: 100 mL via INTRAVENOUS

## 2017-05-10 DIAGNOSIS — L821 Other seborrheic keratosis: Secondary | ICD-10-CM | POA: Diagnosis not present

## 2017-05-10 DIAGNOSIS — L812 Freckles: Secondary | ICD-10-CM | POA: Diagnosis not present

## 2017-05-10 DIAGNOSIS — Z85820 Personal history of malignant melanoma of skin: Secondary | ICD-10-CM | POA: Diagnosis not present

## 2017-05-10 DIAGNOSIS — D1801 Hemangioma of skin and subcutaneous tissue: Secondary | ICD-10-CM | POA: Diagnosis not present

## 2017-05-12 DIAGNOSIS — N390 Urinary tract infection, site not specified: Secondary | ICD-10-CM | POA: Diagnosis not present

## 2017-05-12 DIAGNOSIS — R82998 Other abnormal findings in urine: Secondary | ICD-10-CM | POA: Diagnosis not present

## 2017-05-12 DIAGNOSIS — R319 Hematuria, unspecified: Secondary | ICD-10-CM | POA: Diagnosis not present

## 2017-05-16 DIAGNOSIS — Z1212 Encounter for screening for malignant neoplasm of rectum: Secondary | ICD-10-CM | POA: Diagnosis not present

## 2017-05-16 DIAGNOSIS — Z1211 Encounter for screening for malignant neoplasm of colon: Secondary | ICD-10-CM | POA: Diagnosis not present

## 2017-05-25 DIAGNOSIS — R350 Frequency of micturition: Secondary | ICD-10-CM | POA: Diagnosis not present

## 2017-05-25 DIAGNOSIS — R3914 Feeling of incomplete bladder emptying: Secondary | ICD-10-CM | POA: Diagnosis not present

## 2017-05-25 DIAGNOSIS — N3020 Other chronic cystitis without hematuria: Secondary | ICD-10-CM | POA: Diagnosis not present

## 2017-05-27 ENCOUNTER — Other Ambulatory Visit: Payer: Self-pay

## 2017-05-27 LAB — COLOGUARD: Cologuard: POSITIVE

## 2017-05-30 ENCOUNTER — Telehealth: Payer: Self-pay | Admitting: Cardiology

## 2017-05-30 NOTE — Telephone Encounter (Signed)
New message    Patient called wants to speak with Malachy Mood would not leave what it was regarding

## 2017-05-30 NOTE — Telephone Encounter (Signed)
Returned call to patient.She stated Dr.Jacobs recommended a colonoscopy due to weight loss and blood in stool.She wanted Dr.Jordan to know.Advised clearances go to a procedure pool.Advised I will make Dr.Jordan aware.

## 2017-05-31 ENCOUNTER — Telehealth: Payer: Self-pay | Admitting: Gastroenterology

## 2017-05-31 NOTE — Telephone Encounter (Signed)
Pt wanted to make sure Dr. Ardis Hughs knows that she takes Xarelto and that she has a pacemaker. She wants to make sure he thinks it is ok for her to have the colon done. Please advise.

## 2017-05-31 NOTE — Telephone Encounter (Signed)
Pt feels nervous about having a colon because she has a Psychologist, forensic and also take BT. She wants to make sure that Dr. Ardis Hughs wants to her to have procedure. Please call her

## 2017-05-31 NOTE — Telephone Encounter (Signed)
When I saw her in the office several weeks ago she was very clear that she wanted colon cancer screening even at the age 84.  We had a long discussion, she was adamant. She agreed to cologuard knowing that if it was positive I would recommend a colonoscopy.  It was positive and so I recommend she have a colonoscopy .   I am aware of her pacemaker and her xarelto, still recommend she have a colonoscopy because the cologuard screening test was positive.    If she is concerned, reluctant I understand and am happy to instead see her in the office to discuss this further instead of proceeding with colonoscopy.

## 2017-06-01 ENCOUNTER — Telehealth: Payer: Self-pay | Admitting: Gastroenterology

## 2017-06-01 ENCOUNTER — Telehealth: Payer: Self-pay

## 2017-06-01 NOTE — Telephone Encounter (Signed)
Her xarelto should be held for 2 days prior to a colonoscoyp, will need to make sure her cardiologist is OK with that plan as is our usual protocol.

## 2017-06-01 NOTE — Telephone Encounter (Signed)
[facility] Pre-operative Risk Assessment     Request for surgical clearance:     Endoscopy Procedure  What type of surgery is being performed?     Colonoscopy   When is this surgery scheduled?     07/05/17  What type of clearance is required ?   Pharmacy  Are there any medications that need to be held prior to surgery and how long? Xarelto  Practice name and name of physician performing surgery?      [facility]  What is your office phone and fax number?      Phone- [phone]  [phone]  Anesthesia type (None, local, MAC, general) ?       MAC

## 2017-06-01 NOTE — Telephone Encounter (Signed)
Spoke with pt and she is aware. States she is ready to schedule the colon. Pt states she will call back to scheduled the appt.

## 2017-06-01 NOTE — Telephone Encounter (Signed)
Left a message asking patient to call back to office for colonoscopy scheduling information.  She is scheduled for her diagnostic colon on 07/05/17 at 8:30am in the [facility] and pre-visit appt on 06/14/17 at 1:30pm.  Message sent to Cardio to address anticoagulation (Xarelto).

## 2017-06-01 NOTE — Telephone Encounter (Signed)
Patient has additional questions, offered an appt with you or an APP but patient declined, see message below.  Please advise.

## 2017-06-02 NOTE — Telephone Encounter (Signed)
Spoke with pt and she is aware of appts. Waiting on anticoag direction.

## 2017-06-03 NOTE — Telephone Encounter (Signed)
Pt takes Xarelto for paroxysmal afib with CHADS2VASc score of 5 (age x2, sex, CHF, HTN). Renal function is normal. Ok to hold Xarelto for 1-2 days prior to endoscopy as needed.

## 2017-06-06 ENCOUNTER — Other Ambulatory Visit: Payer: Self-pay | Admitting: Internal Medicine

## 2017-06-06 NOTE — Telephone Encounter (Signed)
Leeroy Bock [facility]  Pharmacist  Pharmacist  Telephone Encounter  Signed  Encounter Date:  06/01/2017          Signed           [] Hide copied text  [] Hover for details   Pt takes Xarelto for paroxysmal afib with CHADS2VASc score of 5 (age x2, sex, CHF, HTN). Renal function is normal. Ok to hold Xarelto for 1-2 days prior to endoscopy as needed.          Electronically signed by Leeroy Bock, [place] at 06/03/2017 3:46 PM     Telephone on 06/01/2017        Detailed Report

## 2017-06-06 NOTE — Telephone Encounter (Signed)
The pt has been advised and verbalized understanding of the xarelto hold.

## 2017-06-08 DIAGNOSIS — R3914 Feeling of incomplete bladder emptying: Secondary | ICD-10-CM | POA: Diagnosis not present

## 2017-06-14 ENCOUNTER — Other Ambulatory Visit: Payer: Self-pay

## 2017-06-14 ENCOUNTER — Ambulatory Visit (AMBULATORY_SURGERY_CENTER): Payer: Self-pay | Admitting: *Deleted

## 2017-06-14 VITALS — Ht 66.0 in | Wt 127.0 lb

## 2017-06-14 DIAGNOSIS — R195 Other fecal abnormalities: Secondary | ICD-10-CM

## 2017-06-14 MED ORDER — PEG 3350-KCL-NA BICARB-NACL 420 G PO SOLR: 4000.0000 mL | Freq: Once | ORAL | 0 refills | Status: AC

## 2017-06-14 NOTE — Progress Notes (Signed)
No egg or soy allergy known to patient   issues with past sedation with any surgeries  or procedures of hard to wake ppost op , no intubation problems  No diet pills per patient No home 02 use per patient  No blood thinners per patient  Pt denies issues with constipation  No A fib or A flutter  EMMI video sent to pt's e mail - pt declined  Pt anxious about this procedure

## 2017-06-23 NOTE — Progress Notes (Signed)
[name] Date of Birth: [birth year] Medical Record #[id number]  History of Present Illness: [name] is seen today for follow up. She has multiple medical issues which include a cardiomyopathy, underlying BiV/ICD which resulted in improvement of her EF, LBBB, HTN, thyroid disease, melanoma, anxiety/depression and chronic systolic HF.   In September she was noted in device clinic to have paroxysmal Afib with controlled rate. She was started on Eliquis. [place] Vasc score of at least 4. She developed itching on Eliquis and switched to Xarelto. Itching did not go away so she was switched to Coumadin. After about a month the itching resolved.  She was later switched back to Xarelto without rash. Last ICD check in February was satisfactory.    On follow up today she has a lot of complaints but really doing quite well from a cardiac standpoint. No dyspnea, chest pain, edema, palpitations. Notes Cologuard test was abnormal and is scheduled for colonoscopy later this month. Will hold Xarelto for procedure.    Current Outpatient Medications  Medication Sig Dispense Refill  . acetaminophen (TYLENOL) 500 MG tablet Take 500 mg by mouth every 6 (six) hours as needed for pain. For pain    . Ascorbic Acid (VITAMIN C) 1000 MG tablet Take 1,000 mg by mouth daily.    . Biotin 5000 MCG TABS Take 1 tablet by mouth daily.    . Calcium Carbonate-Vitamin D (CALCIUM + D PO) Take 2 tablets by mouth daily.     . Cyanocobalamin (VITAMIN B 12 PO) Take 1 tablet by mouth daily.     Marland Kitchen ESTRACE VAGINAL 0.1 MG/GM vaginal cream Place 1 g vaginally 2 (two) times a week.     . furosemide (LASIX) 20 MG tablet Take 1 tablet (20 mg total) by mouth daily as needed for fluid or edema. 30 tablet 6  . Magnesium 250 MG TABS Take 1 tablet by mouth daily.     . Multiple Vitamin (MULTIVITAMIN WITH MINERALS) TABS Take 1 tablet by mouth daily.    . potassium chloride SA (K-DUR,KLOR-CON) 20 MEQ tablet Take 1 tablet (20 mEq total) by  mouth daily as needed (only with lasix). 30 tablet 6  . RABEprazole (ACIPHEX) 20 MG tablet TAKE 1 TABLET ONCE DAILY   FOR STOMACH *LUPIN MFR* 90 tablet 3  . rivaroxaban (XARELTO) 20 MG TABS tablet TAKE 1 TABLET DAILY WITH   SUPPER 90 tablet 3  . SYNTHROID 112 MCG tablet TAKE 1 TABLET BY MOUTH EVERY DAY 30 MINUTES BEFORE BREAKFAST FOR THYROID 90 tablet 1  . tamsulosin (FLOMAX) 0.4 MG CAPS capsule Take 0.4 mg by mouth daily.  2  . trandolapril (MAVIK) 2 MG tablet Take 2 mg twice a day 60 tablet 6  . traZODone (DESYREL) 50 MG tablet Take 1/4 tablet nightly for sleep and depression 15 tablet 5   No current facility-administered medications for this visit.     Allergies  Allergen Reactions  . Hydrocodone Itching  . Cephalexin Itching and Swelling  . Ciprofloxacin     Not indicated due to aneurysm in aorta    . Doxycycline Other (See Comments)    Per pt: unknown  . Escitalopram     Dry mouth  . Levofloxacin     Not indicated due to aneurysm in aorta   . Moxifloxacin     Not indicated due to aneurysm in aorta   . Ofloxacin Other (See Comments)    Per pt: unknown  . Sertraline     insomnia  .  Sulfamethoxazole Hives    Any sulfa meds.  . Tape Other (See Comments)    Burns skin.   . Latex Rash    "If pt. Makes contact with or wearing"  . Neomycin Rash    Past Medical History:  Diagnosis Date  . Allergy    all year   . Anxiety disorder   . Arthritis   . Barrett's esophagus 04/2008  . Benign neoplasm of colon   . Blood transfusion without reported diagnosis    long ago per pt  . Cataract    removed both eyes  . CHF (congestive heart failure) ([place])   . Chronic airway obstruction, not elsewhere classified   . Complication of anesthesia    slow to wake up  . Depressive disorder, not elsewhere classified   . Diverticulitis   . Dysthymic disorder   . Erosive gastritis 08/2004  . GERD (gastroesophageal reflux disease)   . Hair thinning   . HTN (hypertension)    pt denies   .  Hyperplastic polyps of stomach 06/2002  . Hypothyroidism   . ICD (implantable cardiac defibrillator) in place    BiV/ICD; s/p removal of ICD with insertion of BiV PPM 04/26/12  . Insomnia 08/20/2015  . Kyphosis 08/20/2015  . LBBB (left bundle branch block)   . Melanoma ([place])    right arm  . Mitral regurgitation   . Nonischemic cardiomyopathy ([place])    EF initially 20%; Last measurement up to 50% per echo in August of 2012  . Osteopenia   . Other and unspecified hyperlipidemia   . Other and unspecified hyperlipidemia   . Pacemaker 04/26/2012  . Pain in joint, lower leg   . Palpitations   . Restless legs syndrome (RLS)   . Synovial cyst of popliteal space   . Unspecified chronic bronchitis ([place])   . Unspecified nasal polyp   . Unspecified sinusitis (chronic)     Past Surgical History:  Procedure Laterality Date  . ABDOMINAL HYSTERECTOMY  1076   endometriosis  . APPENDECTOMY    . BI-VENTRICULAR PACEMAKER INSERTION N/A 04/26/2012   Procedure: BI-VENTRICULAR PACEMAKER INSERTION (CRT-P);  Surgeon: Evans Lance, MD;  Location: [facility] CATH [facility];  Service: Cardiovascular;  Laterality: N/A;  . COLONOSCOPY    . defibrillator insertion     s/p removal of previously implanted BiV ICD and insertion of a new BiV pacemaker on 04/26/12  . excise mole of lip  2001   Dr. Larena Sox  . excision of wen  1984   Dr. Parks Ranger  . FEMORAL HERNIA REPAIR  12/25/2012   Dr Dalbert Batman  . FEMORAL HERNIA REPAIR  01/04/2013   Recurrent - Dr. Lilyan Punt  . INCISIONAL HERNIA REPAIR N/A 10/01/2013   Procedure: LAPAROSCOPIC INCISIONAL HERNIA;  Surgeon: Gayland Curry, MD;  Location: [facility];  Service: General;  Laterality: N/A;  . INGUINAL HERNIA REPAIR Right 09/26/2012   Procedure: RIGHT INGUINAL HERNIA REPAIR WITH MESH;  Surgeon: Odis Hollingshead, MD;  Location: [facility];  Service: General;  Laterality: Right;  . INGUINAL HERNIA REPAIR Right 12/25/2012   Procedure: explor right groin, small bowel rescection, tissue repair right  femoral hernia;  Surgeon: Adin Hector, MD;  Location: [facility];  Service: General;  Laterality: Right;  . INSERTION OF MESH Right 09/26/2012   Procedure: INSERTION OF MESH;  Surgeon: Odis Hollingshead, MD;  Location: [facility];  Service: General;  Laterality: Right;  . INSERTION OF MESH N/A 10/01/2013   Procedure: INSERTION OF MESH;  Surgeon: Gayland Curry, MD;  Location: [facility];  Service: General;  Laterality: N/A;  . LAPAROSCOPY N/A 01/04/2013   Procedure: Diagnostic Laparoscopy, exploratory laparotomy with small bowel resection, closure of right femoral hernia repair;  Surgeon: Madilyn Hook, DO;  Location: [facility];  Service: General;  Laterality: N/A;  . Left arm surgery    . POLYPECTOMY    . RIGHT BREAST LUMPECTOMY  1988   Dr. Marylene Buerger  . right knee surgery     arthroscopy: Dr. Shellia Carwin  . TONSILLECTOMY    . UPPER GASTROINTESTINAL ENDOSCOPY      Social History   Tobacco Use  Smoking Status Former Smoker  . Packs/day: 1.00  . Years: 15.00  . Pack years: 15.00  . Types: Cigarettes  . Last attempt to quit: 12/22/1993  . Years since quitting: 23.5  Smokeless Tobacco Never Used  Tobacco Comment   Does'nt reall year quit     Social History   Substance and Sexual Activity  Alcohol Use No  . Alcohol/week: 0.0 oz    Family History  Problem Relation Age of Onset  . Stroke Father   . Heart disease Other        maternal side  . Colon cancer Paternal Uncle   . Breast cancer Maternal Aunt   . Colon cancer Cousin   . Diabetes Neg Hx   . Colon polyps Neg Hx   . Rectal cancer Neg Hx   . Stomach cancer Neg Hx     Review of Systems: The review of systems is per the HPI.   All other systems were reviewed and are negative.  Physical Exam: BP 102/62   Pulse 60   Ht 5\' 7"  (1.702 m)   Wt 126 lb 12.8 oz (57.5 kg)   BMI 19.86 kg/m  GENERAL:  Elderly WF in NAD HEENT:  PERRL, EOMI, sclera are clear. Oropharynx is clear. NECK:  No jugular venous distention, carotid upstroke  brisk and symmetric, no bruits, no thyromegaly or adenopathy LUNGS:  Clear to auscultation bilaterally CHEST:  Unremarkable HEART:  RRR,  PMI not displaced or sustained,S1 and S2 within normal limits, no S3, no S4: no clicks, no rubs, no murmurs ABD:  Soft, nontender. BS +, no masses or bruits. No hepatomegaly, no splenomegaly EXT:  2 + pulses throughout, no edema, no cyanosis no clubbing SKIN:  Warm and dry.  No rashes NEURO:  Alert and oriented x 3. Cranial nerves II through XII intact. PSYCH:  Depressed affect    Wt Readings from Last 3 Encounters:  06/24/17 126 lb 12.8 oz (57.5 kg)  06/14/17 127 lb (57.6 kg)  04/26/17 129 lb 4 oz (58.6 kg)     LABORATORY DATA: Lab Results  Component Value Date   WBC 5.4 12/06/2016   HGB 12.6 12/06/2016   HCT 37.4 12/06/2016   PLT 207 12/06/2016   GLUCOSE 90 12/06/2016   CHOL 161 12/06/2016   TRIG 90 12/06/2016   HDL 68 12/06/2016   LDLCALC 76 12/06/2016   ALT 12 12/06/2016   AST 19 12/06/2016   NA 138 12/06/2016   K 4.7 12/06/2016   CL 105 12/06/2016   CREATININE 0.73 04/28/2017   BUN 19 04/28/2017   CO2 28 12/06/2016   TSH 0.60 12/06/2016   INR 2.1 01/26/2016    Last ICD check in February 2019 showed biv pacing 100 % of the time. No  Afib.  Echo: 07/22/16: Study Conclusions  - Left ventricle: The cavity size was  mildly dilated. Wall   thickness was normal. Systolic function was normal. The estimated   ejection fraction was in the range of 50% to 55%. Wall motion was   normal; there were no regional wall motion abnormalities. - Aortic valve: There was trivial regurgitation. - Mitral valve: There was mild regurgitation. - Atrial septum: No defect or patent foramen ovale was identified.    Assessment / Plan: 1.  History of dialted Cardiomyopathy with chronic systolic CHF.  EF last normal 55%- has BiV/ICD in place.   Echo in May 2018 showed a good EF and was unchanged. She is asymptomatic.   2. NSVT. Follow up ICD/biV in  device clinic.   3. Paroxysmal Afib. [place] Vasc score of at least 4. On Xarelto. Tolerating well.   4. Small AAA and left iliac aneurysm. Follow up with [facility]. Last doppler in August 2018 showed iliac diameter 1.6 cm and Aorta 2.4 cm. Recent CT of [place]/pelvis looked OK.   I will follow up in 6 months.

## 2017-06-24 ENCOUNTER — Encounter: Payer: Self-pay | Admitting: Cardiology

## 2017-06-24 ENCOUNTER — Ambulatory Visit (INDEPENDENT_AMBULATORY_CARE_PROVIDER_SITE_OTHER): Payer: Medicare Other | Admitting: Cardiology

## 2017-06-24 VITALS — BP 102/62 | HR 60 | Ht 67.0 in | Wt 126.8 lb

## 2017-06-24 DIAGNOSIS — I10 Essential (primary) hypertension: Secondary | ICD-10-CM

## 2017-06-24 DIAGNOSIS — I447 Left bundle-branch block, unspecified: Secondary | ICD-10-CM

## 2017-06-24 DIAGNOSIS — Z950 Presence of cardiac pacemaker: Secondary | ICD-10-CM

## 2017-06-24 DIAGNOSIS — I5022 Chronic systolic (congestive) heart failure: Secondary | ICD-10-CM | POA: Diagnosis not present

## 2017-06-24 NOTE — Patient Instructions (Addendum)
Continue your current therapy  I will see you in 6 months.   

## 2017-07-05 ENCOUNTER — Ambulatory Visit (AMBULATORY_SURGERY_CENTER): Payer: Medicare Other | Admitting: Gastroenterology

## 2017-07-05 ENCOUNTER — Other Ambulatory Visit: Payer: Self-pay

## 2017-07-05 ENCOUNTER — Encounter: Payer: Self-pay | Admitting: Gastroenterology

## 2017-07-05 VITALS — BP 147/60 | HR 55 | Temp 97.1°F | Resp 8 | Ht 66.0 in | Wt 127.0 lb

## 2017-07-05 DIAGNOSIS — R195 Other fecal abnormalities: Secondary | ICD-10-CM | POA: Diagnosis not present

## 2017-07-05 DIAGNOSIS — K5730 Diverticulosis of large intestine without perforation or abscess without bleeding: Secondary | ICD-10-CM | POA: Diagnosis not present

## 2017-07-05 MED ORDER — SODIUM CHLORIDE 0.9 % IV SOLN: 500.0000 mL | Freq: Once | INTRAVENOUS | Status: DC

## 2017-07-05 NOTE — Patient Instructions (Signed)
YOU HAD AN ENDOSCOPIC PROCEDURE TODAY AT THE [facility]:   Refer to the procedure report that was given to you for any specific questions about what was found during the examination.  If the procedure report does not answer your questions, please call your gastroenterologist to clarify.  If you requested that your care partner not be given the details of your procedure findings, then the procedure report has been included in a sealed envelope for you to review at your convenience later.  YOU SHOULD EXPECT: Some feelings of bloating in the abdomen. Passage of more gas than usual.  Walking can help get rid of the air that was put into your GI tract during the procedure and reduce the bloating. If you had a lower endoscopy (such as a colonoscopy or flexible sigmoidoscopy) you may notice spotting of blood in your stool or on the toilet paper. If you underwent a bowel prep for your procedure, you may not have a normal bowel movement for a few days.  Please Note:  You might notice some irritation and congestion in your nose or some drainage.  This is from the oxygen used during your procedure.  There is no need for concern and it should clear up in a day or so.  SYMPTOMS TO REPORT IMMEDIATELY:   Following lower endoscopy (colonoscopy or flexible sigmoidoscopy):  Excessive amounts of blood in the stool  Significant tenderness or worsening of abdominal pains  Swelling of the abdomen that is new, acute  Fever of 100F or higher  Please see handouts given to you on Diverticulosis and Hemorrhoids.  For urgent or emergent issues, a gastroenterologist can be reached at any hour by calling [phone].   DIET:  We do recommend a small meal at first, but then you may proceed to your regular diet.  Drink plenty of fluids but you should avoid alcoholic beverages for 24 hours.  ACTIVITY:  You should plan to take it easy for the rest of today and you should NOT DRIVE or use heavy machinery until  tomorrow (because of the sedation medicines used during the test).    FOLLOW UP: Our staff will call the number listed on your records the next business day following your procedure to check on you and address any questions or concerns that you may have regarding the information given to you following your procedure. If we do not reach you, we will leave a message.  However, if you are feeling well and you are not experiencing any problems, there is no need to return our call.  We will assume that you have returned to your regular daily activities without incident.  If any biopsies were taken you will be contacted by phone or by letter within the next 1-3 weeks.  Please call us at [phone] if you have not heard about the biopsies in 3 weeks.    SIGNATURES/CONFIDENTIALITY: You and/or your care partner have signed paperwork which will be entered into your electronic medical record.  These signatures attest to the fact that that the information above on your After Visit Summary has been reviewed and is understood.  Full responsibility of the confidentiality of this discharge information lies with you and/or your care-partner.  Thank you for letting us take care of your healthcare needs today. 

## 2017-07-05 NOTE — Progress Notes (Signed)
Report given to PACU, vss 

## 2017-07-05 NOTE — Op Note (Signed)
[facility] Patient Name: [name] Procedure Date: 07/05/2017 8:34 AM MRN: [id number] Endoscopist: Milus Banister , MD Age: 84 Referring MD:  Date of Birth: October 22, 1932 Gender: Female Account #: [id number] Procedure:                Colonoscopy Indications:              Positive Cologuard test Medicines:                Monitored Anesthesia Care Procedure:                Pre-Anesthesia Assessment:                           - Prior to the procedure, a History and Physical                            was performed, and patient medications and                            allergies were reviewed. The patient's tolerance of                            previous anesthesia was also reviewed. The risks                            and benefits of the procedure and the sedation                            options and risks were discussed with the patient.                            All questions were answered, and informed consent                            was obtained. Prior Anticoagulants: The patient has                            taken Xarelto (rivaroxaban), last dose was 2 days                            prior to procedure. ASA Grade Assessment: II - A                            patient with mild systemic disease. After reviewing                            the risks and benefits, the patient was deemed in                            satisfactory condition to undergo the procedure.                           After obtaining informed consent, the colonoscope  was passed under direct vision. Throughout the                            procedure, the patient's blood pressure, pulse, and                            oxygen saturations were monitored continuously. The                            Colonoscope was introduced through the anus and                            advanced to the the cecum, identified by                            appendiceal orifice and ileocecal  valve. The                            colonoscopy was performed without difficulty. The                            patient tolerated the procedure well. The quality                            of the bowel preparation was good. Scope In: 8:42:21 AM Scope Out: 1:74:08 AM Scope Withdrawal Time: 0 hours 7 minutes 36 seconds  Total Procedure Duration: 0 hours 13 minutes 57 seconds  Findings:                 Multiple small and large-mouthed diverticula were                            found in the entire colon.                           Small internal hemorrhoids.                           The exam was otherwise without abnormality on                            direct and retroflexion views. Complications:            No immediate complications. Estimated blood loss:                            None. Estimated Blood Loss:     Estimated blood loss: none. Impression:               - Diverticulosis in the entire examined colon.                           - Hemorrhoids.                           - The examination was otherwise normal on direct  and retroflexion views.                           - No polyps or cancers. Recommendation:           - Patient has a contact number available for                            emergencies. The signs and symptoms of potential                            delayed complications were discussed with the                            patient. Return to normal activities tomorrow.                            Written discharge instructions were provided to the                            patient.                           - Resume previous diet.                           - Continue present medications.                           You do not need any further colon cancer screening                            tests (including stool testing). These types of                            tests generally stop around age 100-80. Milus Banister, MD 07/05/2017  9:03:31 AM This report has been signed electronically.

## 2017-07-05 NOTE — Progress Notes (Signed)
Pt's states no medical or surgical changes since previsit or office visit. 

## 2017-07-06 ENCOUNTER — Telehealth: Payer: Self-pay

## 2017-07-06 NOTE — Telephone Encounter (Signed)
  Follow up Call-  Call back number 07/05/2017  Post procedure Call Back phone  # [phone]  Permission to leave phone message Yes  Some recent data might be hidden     Patient questions:  Do you have a fever, pain , or abdominal swelling? No. Pain Score  0 *  Have you tolerated food without any problems? Yes.    Have you been able to return to your normal activities? Yes.    Do you have any questions about your discharge instructions: Diet   No. Medications  No. Follow up visit  No.  Do you have questions or concerns about your Care? No.  Actions: * If pain score is 4 or above: No action needed, pain <4.

## 2017-07-13 ENCOUNTER — Other Ambulatory Visit: Payer: Self-pay | Admitting: Cardiology

## 2017-07-13 MED ORDER — FUROSEMIDE 20 MG PO TABS: 20.0000 mg | ORAL_TABLET | Freq: Every day | ORAL | 6 refills | Status: DC | PRN

## 2017-07-13 MED ORDER — POTASSIUM CHLORIDE CRYS ER 20 MEQ PO TBCR: 20.0000 meq | EXTENDED_RELEASE_TABLET | Freq: Every day | ORAL | 6 refills | Status: DC | PRN

## 2017-07-13 NOTE — Telephone Encounter (Signed)
°*  STAT* If patient is at the pharmacy, call can be transferred to refill team.   1. Which medications need to be refilled? (please list name of each medication and dose if known) Furosemide and  K-Dur Klor-Con  2. Which pharmacy/location (including street and city if local pharmacy) is medication to be sent to?[facility] [phone]  3. Do they need a 30 day or 90 day supply? 30 and refill

## 2017-07-13 NOTE — Telephone Encounter (Signed)
Rx(s) sent to pharmacy electronically.  

## 2017-07-21 ENCOUNTER — Ambulatory Visit (INDEPENDENT_AMBULATORY_CARE_PROVIDER_SITE_OTHER): Payer: Medicare Other | Admitting: Internal Medicine

## 2017-07-21 ENCOUNTER — Encounter: Payer: Self-pay | Admitting: Internal Medicine

## 2017-07-21 VITALS — BP 120/60 | HR 54 | Temp 97.4°F | Ht 66.0 in | Wt 124.0 lb

## 2017-07-21 DIAGNOSIS — E785 Hyperlipidemia, unspecified: Secondary | ICD-10-CM

## 2017-07-21 DIAGNOSIS — M4030 Flatback syndrome, site unspecified: Secondary | ICD-10-CM | POA: Diagnosis not present

## 2017-07-21 DIAGNOSIS — I4891 Unspecified atrial fibrillation: Secondary | ICD-10-CM

## 2017-07-21 DIAGNOSIS — E440 Moderate protein-calorie malnutrition: Secondary | ICD-10-CM

## 2017-07-21 DIAGNOSIS — I10 Essential (primary) hypertension: Secondary | ICD-10-CM | POA: Diagnosis not present

## 2017-07-21 DIAGNOSIS — I723 Aneurysm of iliac artery: Secondary | ICD-10-CM

## 2017-07-21 DIAGNOSIS — E039 Hypothyroidism, unspecified: Secondary | ICD-10-CM | POA: Diagnosis not present

## 2017-07-21 DIAGNOSIS — J42 Unspecified chronic bronchitis: Secondary | ICD-10-CM

## 2017-07-21 LAB — CBC WITH DIFFERENTIAL/PLATELET
Basophils Absolute: 33 cells/uL (ref 0–200)
Basophils Relative: 0.5 %
Eosinophils Absolute: 286 cells/uL (ref 15–500)
Eosinophils Relative: 4.4 %
HCT: 33.8 % — ABNORMAL LOW (ref 35.0–45.0)
Hemoglobin: 11.4 g/dL — ABNORMAL LOW (ref 11.7–15.5)
Lymphs Abs: 1580 cells/uL (ref 850–3900)
MCH: 29 pg (ref 27.0–33.0)
MCHC: 33.7 g/dL (ref 32.0–36.0)
MCV: 86 fL (ref 80.0–100.0)
MPV: 10.4 fL (ref 7.5–12.5)
Monocytes Relative: 11.5 %
Neutro Abs: 3855 cells/uL (ref 1500–7800)
Neutrophils Relative %: 59.3 %
Platelets: 196 10*3/uL (ref 140–400)
RBC: 3.93 10*6/uL (ref 3.80–5.10)
RDW: 13.3 % (ref 11.0–15.0)
Total Lymphocyte: 24.3 %
WBC mixed population: 748 cells/uL (ref 200–950)
WBC: 6.5 10*3/uL (ref 3.8–10.8)

## 2017-07-21 LAB — LIPID PANEL
Cholesterol: 143 mg/dL (ref <200)
HDL: 65 mg/dL (ref >50)
LDL Cholesterol (Calc): 58 mg/dL (calc)
Non-HDL Cholesterol (Calc): 78 mg/dL (calc) (ref <130)
Total CHOL/HDL Ratio: 2.2 (calc) (ref <5.0)
Triglycerides: 117 mg/dL (ref <150)

## 2017-07-21 LAB — COMPLETE METABOLIC PANEL WITH GFR
AG Ratio: 2 (calc) (ref 1.0–2.5)
ALT: 13 U/L (ref 6–29)
AST: 21 U/L (ref 10–35)
Albumin: 4.1 g/dL (ref 3.6–5.1)
Alkaline phosphatase (APISO): 77 U/L (ref 33–130)
BUN/Creatinine Ratio: 43 (calc) — ABNORMAL HIGH (ref 6–22)
BUN: 34 mg/dL — ABNORMAL HIGH (ref 7–25)
CO2: 28 mmol/L (ref 20–32)
Calcium: 9.1 mg/dL (ref 8.6–10.4)
Chloride: 103 mmol/L (ref 98–110)
Creat: 0.79 mg/dL (ref 0.60–0.88)
GFR, Est African American: 81 mL/min/{1.73_m2} (ref >=60)
GFR, Est Non African American: 70 mL/min/{1.73_m2} (ref >=60)
Globulin: 2.1 g/dL (calc) (ref 1.9–3.7)
Glucose, Bld: 87 mg/dL (ref 65–139)
Potassium: 4.6 mmol/L (ref 3.5–5.3)
Sodium: 134 mmol/L — ABNORMAL LOW (ref 135–146)
Total Bilirubin: 0.3 mg/dL (ref 0.2–1.2)
Total Protein: 6.2 g/dL (ref 6.1–8.1)

## 2017-07-21 LAB — TSH: TSH: 0.31 mIU/L — ABNORMAL LOW (ref 0.40–4.50)

## 2017-07-21 NOTE — Progress Notes (Signed)
Location:  [facility] clinic Provider:  Jayon Matton L. Mariea Clonts, D.O., C.M.D.  Code Status: full code Goals of Care:  Advanced Directives 07/21/2017  Does Patient Have a Medical Advance Directive? Yes  Type of Advance Directive -  Does patient want to make changes to medical advance directive? No - Patient declined  Copy of [place] in Chart? No - copy requested  Would patient like information on creating a medical advance directive? -  Pre-existing out of facility DNR order (yellow form or pink MOST form) -     Chief Complaint  Patient presents with  . Medical Management of Chronic Issues    75mth follow-up    HPI: Patient is a 84 y.o. female seen today for medical management of chronic diseases.    Her terrible cough has finally gotten better.  Saw Dr. Chase Caller on 1/9 and then was to go back in 12 wks, but she had been better so didn't go back then.    Went to Dr. Ardis Hughs b/c she has strange pains in her abdomen to make sure nothing had come undone.  He did a scan of her abdomen and things looked ok.  She had not had a cscope since '04 and had not wanted to do it.  She did the cologuard which came back positive.  Of course she has lost a lot of weight.  She was in a panic that she may have colon cancer.  She had a colonoscopy.  It showed only diverticulosis and hemorrhoids.   She had another UTI and [facility] referred her back to Dr. McDiarmid's office and he looked inside her bladder.  He offered pills for her urinary frequency at night, but she refused.  His PA then gave her flomax which she took for a while.  She felt like it made her dizzy and unsteady.  It also almost stopped her from peeing altogether.  She then had a scan and was emptying properly.  She goes back there 6/5.    BP is good.    Still smells strongly of smoke.  Went to cardiologist also and dentist.  Asks about armour thyroid.  Had a neighbor who took that b/c she could not take synthroid.  She was  reading [facility].    She continues to lose weight.  She's 124 lbs here today, was 127 on 4/30.  She's not struggling to breathe.  cscope was negative. CT chest was negative in 7/18 for pulmonary cause (stable pulmonary nodules and emphysema).  Is incredibly tired at night.  Can hardly walk across the floor.  Does ok in the morning.  Mammogram normal in 6/18.  Has a note to call for her next one.  He has not noticed any masses in the breasts since.  Dr. McDiarmid just looked in her bladder and no mass was found there.  She says she had barrett's at one time, but that was later cleared.  She mentioned cough to Dr. Ardis Hughs and no endoscopy was suggested.    Has cereal with banana and blueberries, popcorn snack, sometimes something sweet all around 10-12.  Drinks an ensure or protein drink in 2-3 hrs.  At night, she goes to the cafeteria for veggies and pie or lean cuisines with salad or hamburgers and fries.  Her eating habits have not changed much.  Talked about eating small bits between meals.  Has dessert every night.    Back hurts her at some point during the day most days.  She takes 2-4 tylenol per day due to pain.  She says she can't do anything at that time.  Has flat back syndrome.  She says she looks awful and she's afraid to go anywhere.  Dr. Patrice Paradise wrote an article about this syndrome.  Exercise can help mild forms, but major surgery would be a lot for her.    Past Medical History:  Diagnosis Date  . Allergy    all year   . Anxiety disorder   . Arthritis   . Barrett's esophagus 04/2008  . Benign neoplasm of colon   . Blood transfusion without reported diagnosis    long ago per pt  . Cataract    removed both eyes  . CHF (congestive heart failure) ([place])   . Chronic airway obstruction, not elsewhere classified   . Complication of anesthesia    slow to wake up  . Depressive disorder, not elsewhere classified   . Diverticulitis   . Dysthymic disorder   . Erosive gastritis 08/2004    . GERD (gastroesophageal reflux disease)   . Hair thinning   . HTN (hypertension)    pt denies   . Hyperplastic polyps of stomach 06/2002  . Hypothyroidism   . ICD (implantable cardiac defibrillator) in place    BiV/ICD; s/p removal of ICD with insertion of BiV PPM 04/26/12  . Insomnia 08/20/2015  . Kyphosis 08/20/2015  . LBBB (left bundle branch block)   . Melanoma ([place])    right arm  . Mitral regurgitation   . Nonischemic cardiomyopathy ([place])    EF initially 20%; Last measurement up to 50% per echo in August of 2012  . Osteopenia   . Other and unspecified hyperlipidemia   . Other and unspecified hyperlipidemia   . Pacemaker 04/26/2012  . Pain in joint, lower leg   . Palpitations   . Restless legs syndrome (RLS)   . Synovial cyst of popliteal space   . Unspecified chronic bronchitis ([place])   . Unspecified nasal polyp   . Unspecified sinusitis (chronic)     Past Surgical History:  Procedure Laterality Date  . ABDOMINAL HYSTERECTOMY  1076   endometriosis  . APPENDECTOMY    . BI-VENTRICULAR PACEMAKER INSERTION N/A 04/26/2012   Procedure: BI-VENTRICULAR PACEMAKER INSERTION (CRT-P);  Surgeon: Evans Lance, MD;  Location: [facility] CATH LAB;  Service: Cardiovascular;  Laterality: N/A;  . COLONOSCOPY    . defibrillator insertion     s/p removal of previously implanted BiV ICD and insertion of a new BiV pacemaker on 04/26/12  . excise mole of lip  2001   Dr. Larena Sox  . excision of wen  1984   Dr. Parks Ranger  . FEMORAL HERNIA REPAIR  12/25/2012   Dr Dalbert Batman  . FEMORAL HERNIA REPAIR  01/04/2013   Recurrent - Dr. Lilyan Punt  . INCISIONAL HERNIA REPAIR N/A 10/01/2013   Procedure: LAPAROSCOPIC INCISIONAL HERNIA;  Surgeon: Gayland Curry, MD;  Location: [facility] ORS;  Service: General;  Laterality: N/A;  . INGUINAL HERNIA REPAIR Right 09/26/2012   Procedure: RIGHT INGUINAL HERNIA REPAIR WITH MESH;  Surgeon: Odis Hollingshead, MD;  Location: [place];  Service: General;  Laterality: Right;  . INGUINAL  HERNIA REPAIR Right 12/25/2012   Procedure: explor right groin, small bowel rescection, tissue repair right femoral hernia;  Surgeon: Adin Hector, MD;  Location: [facility];  Service: General;  Laterality: Right;  . INSERTION OF MESH Right 09/26/2012   Procedure: INSERTION OF MESH;  Surgeon: Odis Hollingshead, MD;  Location: [facility] OR;  Service: General;  Laterality: Right;  . INSERTION OF MESH N/A 10/01/2013   Procedure: INSERTION OF MESH;  Surgeon: Gayland Curry, MD;  Location: [facility];  Service: General;  Laterality: N/A;  . LAPAROSCOPY N/A 01/04/2013   Procedure: Diagnostic Laparoscopy, exploratory laparotomy with small bowel resection, closure of right femoral hernia repair;  Surgeon: Madilyn Hook, DO;  Location: [facility];  Service: General;  Laterality: N/A;  . Left arm surgery    . POLYPECTOMY    . RIGHT BREAST LUMPECTOMY  1988   Dr. Marylene Buerger  . right knee surgery     arthroscopy: Dr. Shellia Carwin  . TONSILLECTOMY    . UPPER GASTROINTESTINAL ENDOSCOPY      Allergies  Allergen Reactions  . Hydrocodone Itching  . Cephalexin Itching and Swelling  . Ciprofloxacin     Not indicated due to aneurysm in aorta    . Doxycycline Other (See Comments)    Per pt: unknown  . Escitalopram     Dry mouth  . Levofloxacin     Not indicated due to aneurysm in aorta   . Moxifloxacin     Not indicated due to aneurysm in aorta   . Ofloxacin Other (See Comments)    Per pt: unknown  . Sertraline     insomnia  . Sulfamethoxazole Hives    Any sulfa meds.  . Tape Other (See Comments)    Burns skin.   . Latex Rash    "If pt. Makes contact with or wearing"  . Neomycin Rash    Outpatient Encounter Medications as of 07/21/2017  Medication Sig  . acetaminophen (TYLENOL) 500 MG tablet Take 500 mg by mouth every 6 (six) hours as needed for pain. For pain  . Ascorbic Acid (VITAMIN C) 1000 MG tablet Take 1,000 mg by mouth daily.  . Biotin 5000 MCG TABS Take 1 tablet by mouth daily.  . Calcium  Carbonate-Vitamin D (CALCIUM + D PO) Take 2 tablets by mouth daily.   . Cyanocobalamin (VITAMIN B 12 PO) Take 1 tablet by mouth daily.   Marland Kitchen ESTRACE VAGINAL 0.1 MG/GM vaginal cream Place 1 g vaginally 2 (two) times a week.   . furosemide (LASIX) 20 MG tablet Take 1 tablet (20 mg total) by mouth daily as needed for fluid or edema.  . Magnesium 250 MG TABS Take 1 tablet by mouth daily.   . Multiple Vitamin (MULTIVITAMIN WITH MINERALS) TABS Take 1 tablet by mouth daily.  Marland Kitchen omeprazole (PRILOSEC) 20 MG capsule Take 20 mg by mouth daily.  . potassium chloride SA (K-DUR,KLOR-CON) 20 MEQ tablet Take 1 tablet (20 mEq total) by mouth daily as needed (only with lasix).  . RABEprazole (ACIPHEX) 20 MG tablet TAKE 1 TABLET ONCE DAILY   FOR STOMACH *LUPIN MFR*  . rivaroxaban (XARELTO) 20 MG TABS tablet TAKE 1 TABLET DAILY WITH   SUPPER  . SYNTHROID 112 MCG tablet TAKE 1 TABLET BY MOUTH EVERY DAY 30 MINUTES BEFORE BREAKFAST FOR THYROID  . tamsulosin (FLOMAX) 0.4 MG CAPS capsule Take 0.4 mg by mouth daily.  . trandolapril (MAVIK) 2 MG tablet Take 2 mg twice a day  . traZODone (DESYREL) 50 MG tablet Take 1/4 tablet nightly for sleep and depression  . [DISCONTINUED] omeprazole (PRILOSEC) 20 MG capsule Take 1 capsule (20 mg total) by mouth daily.   Facility-Administered Encounter Medications as of 07/21/2017  Medication  . 0.9 %  sodium chloride infusion    Review of Systems:  Review  of Systems  Constitutional: Positive for malaise/fatigue and weight loss. Negative for chills and fever.       Fatigue later in day only, does get refreshed in am  HENT: Negative for congestion.   Eyes:       Glasses for reading  Respiratory: Positive for cough and wheezing. Negative for shortness of breath.        Improved cough  Cardiovascular: Negative for chest pain, palpitations and leg swelling.  Gastrointestinal: Negative for abdominal pain, blood in stool, constipation, heartburn and melena.  Genitourinary: Negative for  dysuria.  Musculoskeletal: Positive for back pain and neck pain. Negative for falls.  Skin: Negative for itching and rash.  Neurological: Negative for dizziness and loss of consciousness.  Endo/Heme/Allergies: Bruises/bleeds easily.  Psychiatric/Behavioral: Negative for memory loss. The patient is nervous/anxious.     Health Maintenance  Topic Date Due  . PNA vac Low Risk Adult (2 of 2 - PCV13) 03/13/2009  . MAMMOGRAM  08/26/2017  . INFLUENZA VACCINE  10/06/2017  . TETANUS/TDAP  03/15/2021  . DEXA SCAN  Completed    Physical Exam: Vitals:   07/21/17 1435  BP: 120/60  Pulse: (!) 54  Temp: (!) 97.4 F (36.3 C)  TempSrc: Oral  SpO2: 98%  Weight: 124 lb (56.2 kg)  Height: 5\' 6"  (1.676 m)   Body mass index is 20.01 kg/m. Physical Exam  Constitutional: She is oriented to person, place, and time. No distress.  Cachectic female  HENT:  Head: Normocephalic and atraumatic.  Eyes:  glasses  Cardiovascular: Intact distal pulses.  irreg irreg  Pulmonary/Chest: Effort normal. No respiratory distress. She has wheezes.  Abdominal: Soft. Bowel sounds are normal. She exhibits no distension. There is no tenderness.  Musculoskeletal: Normal range of motion.  Flat back  Neurological: She is alert and oriented to person, place, and time.  Skin: Skin is warm and dry.  Purplish discoloration of fingers   Psychiatric: She has a normal mood and affect.    Labs reviewed: Basic Metabolic Panel: Recent Labs    12/06/16 1123 04/28/17 1642  NA 138  --   K 4.7  --   CL 105  --   CO2 28  --   GLUCOSE 90  --   BUN 19 19  CREATININE 0.79 0.73  CALCIUM 9.5  --   MG 2.2  --   TSH 0.60  --    Liver Function Tests: Recent Labs    12/06/16 1123  AST 19  ALT 12  BILITOT 0.5  PROT 6.6   No results for input(s): LIPASE, AMYLASE in the last 8760 hours. No results for input(s): AMMONIA in the last 8760 hours. CBC: Recent Labs    09/03/16 1317 10/20/16 1654 12/06/16 1123  WBC  7.3 7.4 5.4  NEUTROABS 4.4 4.7 2,965  HGB 12.9 12.7 12.6  HCT 38.2 38.7 37.4  MCV 89.0 91.4 87.6  PLT 204.0 213.0 207   Lipid Panel: Recent Labs    12/06/16 1123  CHOL 161  HDL 68  LDLCALC 76  TRIG 90  CHOLHDL 2.4   Assessment/Plan 1. Chronic bronchitis, unspecified chronic bronchitis type ([place]) -ongoing, suspect this is the main reason for her weight loss -cont current regimen as per pulmonary(but she stopped the allergy part on her own)  2. Hypothyroidism, unspecified type -cont current levothyroxine, do not recommend  [place] thyroid - CBC with Differential/Platelet - COMPLETE METABOLIC PANEL WITH GFR - TSH  3. Moderate malnutrition ([place]) -continues with weight loss, suspect  primarily from #1 but she denies having increased work of breathing -does eat a decent about though not always the healthiest choices and has an ensure or boost each day -encouraged more smaller snacks between to help with wt gain -workup for weight loss has been unremarkable (see hpi) -smoking ongoing  4. Hyperlipidemia, unspecified hyperlipidemia type -not on meds, does not tolerate a lot of medications -is already losing weight so restricting her fat in her diet will not help that, is underweight - Lipid panel  5. Essential hypertension -bp at goal, no changes needed - COMPLETE METABOLIC PANEL WITH GFR  6. Aneurysm of iliac artery (HCC) -remains, monitored by cards and vascular  7. Atrial fibrillation, unspecified type ([place]) - cont on xarelto therapy, no major bleeding, does bruise easily, rate is controlled - CBC with Differential/Platelet  8. Acquired flat back syndrome - requests a referral to Dr. Patrice Paradise for this, back pain bothers her at least one period of time each day and limits activity - AMB referral to orthopedics  Labs/tests ordered:   Orders Placed This Encounter  Procedures  . CBC with Differential/Platelet  . COMPLETE METABOLIC PANEL WITH GFR  . TSH  . Lipid panel     Order Specific Question:   Has the patient fasted?    Answer:   No  . AMB referral to orthopedics    Referral Priority:   Routine    Referral Type:   Consultation    Number of Visits Requested:   1   Next appt:  12/08/2017  Marybeth Dandy L. Aleaya Latona, D.O. [facility] [address], [place] [address] Cell Phone (Mon-Fri 8am-5pm):  [phone] On Call:  [phone] & follow prompts after 5pm & weekends Office Phone:  [phone] Office Fax:  [phone]

## 2017-07-25 ENCOUNTER — Telehealth: Payer: Self-pay | Admitting: *Deleted

## 2017-07-25 ENCOUNTER — Encounter: Payer: Self-pay | Admitting: *Deleted

## 2017-07-25 MED ORDER — LEVOTHYROXINE SODIUM 100 MCG PO TABS: 100.0000 ug | ORAL_TABLET | Freq: Every day | ORAL | 0 refills | Status: DC

## 2017-07-25 NOTE — Telephone Encounter (Signed)
-----   Message from Gayland Curry, DO sent at 07/25/2017  8:13 AM EDT ----- Please call patient:   She has mild anemia vs. Her prior labs.  There's been a gradual trend downward.  It may be related to her diet.  She just had a colonoscopy that did not provide an explanation.  I recommend she increase her intake of leafy greens and healthy proteins like grilled or baked chicken or fish.  These may help this anemia.   It also appears that she needs to drink more water based on her BUN (kidney test).  TSH is slightly low.  It's been trending down.  I would like her to reduce her synthroid to 142mcg from 163mcg to adjust for this.  Please notify her and send in new prescription.

## 2017-07-29 ENCOUNTER — Other Ambulatory Visit: Payer: Self-pay | Admitting: Cardiology

## 2017-07-29 NOTE — Telephone Encounter (Signed)
Rx request sent to pharmacy.  

## 2017-08-10 DIAGNOSIS — R350 Frequency of micturition: Secondary | ICD-10-CM | POA: Diagnosis not present

## 2017-08-10 DIAGNOSIS — R3914 Feeling of incomplete bladder emptying: Secondary | ICD-10-CM | POA: Diagnosis not present

## 2017-08-17 DIAGNOSIS — N760 Acute vaginitis: Secondary | ICD-10-CM | POA: Diagnosis not present

## 2017-08-17 DIAGNOSIS — Z01419 Encounter for gynecological examination (general) (routine) without abnormal findings: Secondary | ICD-10-CM | POA: Diagnosis not present

## 2017-08-17 DIAGNOSIS — Z681 Body mass index (BMI) 19 or less, adult: Secondary | ICD-10-CM | POA: Diagnosis not present

## 2017-08-17 DIAGNOSIS — R351 Nocturia: Secondary | ICD-10-CM | POA: Diagnosis not present

## 2017-08-17 DIAGNOSIS — R350 Frequency of micturition: Secondary | ICD-10-CM | POA: Diagnosis not present

## 2017-08-23 ENCOUNTER — Telehealth: Payer: Self-pay | Admitting: Gastroenterology

## 2017-08-23 NOTE — Telephone Encounter (Signed)
[facility] calling to inform cologuard results. They stated to have faxed them over today. Results were positive. Any questions pls call [facility].

## 2017-08-24 NOTE — Telephone Encounter (Signed)
Noted. This was done in March and patient has had colonoscopy.

## 2017-09-01 DIAGNOSIS — M545 Low back pain: Secondary | ICD-10-CM | POA: Diagnosis not present

## 2017-09-01 DIAGNOSIS — M4010 Other secondary kyphosis, site unspecified: Secondary | ICD-10-CM | POA: Diagnosis not present

## 2017-09-13 DIAGNOSIS — Z1231 Encounter for screening mammogram for malignant neoplasm of breast: Secondary | ICD-10-CM | POA: Diagnosis not present

## 2017-09-13 LAB — HM MAMMOGRAPHY

## 2017-09-16 ENCOUNTER — Encounter: Payer: Self-pay | Admitting: *Deleted

## 2017-09-16 DIAGNOSIS — R2689 Other abnormalities of gait and mobility: Secondary | ICD-10-CM | POA: Diagnosis not present

## 2017-09-16 DIAGNOSIS — M6281 Muscle weakness (generalized): Secondary | ICD-10-CM | POA: Diagnosis not present

## 2017-09-21 DIAGNOSIS — R2689 Other abnormalities of gait and mobility: Secondary | ICD-10-CM | POA: Diagnosis not present

## 2017-09-21 DIAGNOSIS — M6281 Muscle weakness (generalized): Secondary | ICD-10-CM | POA: Diagnosis not present

## 2017-09-23 ENCOUNTER — Telehealth: Payer: Self-pay | Admitting: *Deleted

## 2017-09-23 DIAGNOSIS — R2689 Other abnormalities of gait and mobility: Secondary | ICD-10-CM | POA: Diagnosis not present

## 2017-09-23 DIAGNOSIS — M6281 Muscle weakness (generalized): Secondary | ICD-10-CM | POA: Diagnosis not present

## 2017-09-23 NOTE — Telephone Encounter (Signed)
I agree it's a good idea for her to f/u with Dr. [name], et al, if they are able to see her.

## 2017-09-23 NOTE — Telephone Encounter (Signed)
Patient notified. LMOM with Dr. Cyndi Lennert response.

## 2017-09-23 NOTE — Telephone Encounter (Signed)
Patient called and stated that she feels like she has another Hernia but on the left side this time. Stated that it was a "Puff Fat area" in her groin area. Stated that she had a rough time with the Right sided hernia when they did surgery.  Dr. Patrice Paradise had referred her to PT for her back and patient is wondering if it is ok to exercise with this. Patient stated that she is also going to call Dr. Sula Rumple office that did her previous surgery to ask their advise and to see if they can see her. Please Advise.

## 2017-09-27 DIAGNOSIS — R2689 Other abnormalities of gait and mobility: Secondary | ICD-10-CM | POA: Diagnosis not present

## 2017-09-27 DIAGNOSIS — M6281 Muscle weakness (generalized): Secondary | ICD-10-CM | POA: Diagnosis not present

## 2017-09-29 DIAGNOSIS — H04123 Dry eye syndrome of bilateral lacrimal glands: Secondary | ICD-10-CM | POA: Diagnosis not present

## 2017-09-29 DIAGNOSIS — H26491 Other secondary cataract, right eye: Secondary | ICD-10-CM | POA: Diagnosis not present

## 2017-09-29 DIAGNOSIS — H52223 Regular astigmatism, bilateral: Secondary | ICD-10-CM | POA: Diagnosis not present

## 2017-09-29 DIAGNOSIS — D4981 Neoplasm of unspecified behavior of retina and choroid: Secondary | ICD-10-CM | POA: Diagnosis not present

## 2017-09-29 DIAGNOSIS — H524 Presbyopia: Secondary | ICD-10-CM | POA: Diagnosis not present

## 2017-09-30 DIAGNOSIS — M6281 Muscle weakness (generalized): Secondary | ICD-10-CM | POA: Diagnosis not present

## 2017-09-30 DIAGNOSIS — R2689 Other abnormalities of gait and mobility: Secondary | ICD-10-CM | POA: Diagnosis not present

## 2017-10-05 DIAGNOSIS — S76212A Strain of adductor muscle, fascia and tendon of left thigh, initial encounter: Secondary | ICD-10-CM | POA: Diagnosis not present

## 2017-10-05 DIAGNOSIS — R1900 Intra-abdominal and pelvic swelling, mass and lump, unspecified site: Secondary | ICD-10-CM | POA: Diagnosis not present

## 2017-10-10 ENCOUNTER — Other Ambulatory Visit: Payer: Self-pay | Admitting: General Surgery

## 2017-10-10 DIAGNOSIS — R1900 Intra-abdominal and pelvic swelling, mass and lump, unspecified site: Secondary | ICD-10-CM

## 2017-10-12 DIAGNOSIS — M6281 Muscle weakness (generalized): Secondary | ICD-10-CM | POA: Diagnosis not present

## 2017-10-12 DIAGNOSIS — R2689 Other abnormalities of gait and mobility: Secondary | ICD-10-CM | POA: Diagnosis not present

## 2017-10-17 ENCOUNTER — Ambulatory Visit (INDEPENDENT_AMBULATORY_CARE_PROVIDER_SITE_OTHER): Payer: Medicare Other | Admitting: *Deleted

## 2017-10-17 DIAGNOSIS — I5022 Chronic systolic (congestive) heart failure: Secondary | ICD-10-CM

## 2017-10-17 DIAGNOSIS — Z950 Presence of cardiac pacemaker: Secondary | ICD-10-CM | POA: Diagnosis not present

## 2017-10-17 NOTE — Patient Instructions (Signed)
If you have any pacemaker-related questions or concerns, please call the [facility] at [phone].

## 2017-10-18 ENCOUNTER — Other Ambulatory Visit: Payer: Self-pay | Admitting: Internal Medicine

## 2017-10-18 DIAGNOSIS — R2689 Other abnormalities of gait and mobility: Secondary | ICD-10-CM | POA: Diagnosis not present

## 2017-10-18 DIAGNOSIS — M6281 Muscle weakness (generalized): Secondary | ICD-10-CM | POA: Diagnosis not present

## 2017-10-18 LAB — CUP PACEART INCLINIC DEVICE CHECK
Brady Statistic RA Percent Paced: 29 %
Brady Statistic RV Percent Paced: 99 %
Date Time Interrogation Session: 20190812040000
Implantable Lead Implant Date: 20070309
Implantable Lead Implant Date: 20070309
Implantable Lead Implant Date: 20070309
Implantable Lead Location: 753859
Implantable Lead Model: 4087
Implantable Lead Model: 4543
Implantable Lead Serial Number: 121652
Implantable Lead Serial Number: 268502
Implantable Pulse Generator Implant Date: 20140219
Lead Channel Impedance Value: 506 Ohm
Lead Channel Impedance Value: 597 Ohm
Lead Channel Pacing Threshold Amplitude: 1.6 V
Lead Channel Pacing Threshold Amplitude: 3 V
Lead Channel Pacing Threshold Pulse Width: 1 ms
Lead Channel Pacing Threshold Pulse Width: 1.2 ms
Lead Channel Sensing Intrinsic Amplitude: 2.2 mV
Lead Channel Sensing Intrinsic Amplitude: 7 mV
Lead Channel Setting Pacing Amplitude: 2 V
Lead Channel Setting Sensing Sensitivity: 2.5 mV
MDC IDC LEAD LOCATION: 753858
MDC IDC LEAD LOCATION: 753860
MDC IDC LEAD SERIAL: 168416
MDC IDC MSMT LEADCHNL RA PACING THRESHOLD AMPLITUDE: 0.8 V
MDC IDC MSMT LEADCHNL RA PACING THRESHOLD PULSEWIDTH: 0.4 ms
MDC IDC MSMT LEADCHNL RV IMPEDANCE VALUE: 384 Ohm
MDC IDC MSMT LEADCHNL RV SENSING INTR AMPL: 14.1 mV
MDC IDC SET LEADCHNL LV PACING AMPLITUDE: 2.5 V
MDC IDC SET LEADCHNL LV PACING PULSEWIDTH: 1 ms
MDC IDC SET LEADCHNL LV SENSING SENSITIVITY: 2.5 mV
MDC IDC SET LEADCHNL RV PACING AMPLITUDE: 3.5 V
MDC IDC SET LEADCHNL RV PACING PULSEWIDTH: 1.2 ms
Pulse Gen Serial Number: 100543

## 2017-10-18 NOTE — Progress Notes (Signed)
CRT-P device check in clinic. Normal device function. RA and LV thresholds, sensing, impedance consistent with previous measurements. RV threshold now 3.1V @ 1.4ms, threshold 3.0V @ 1.23ms, reprogrammed output to 3.5V @ 1.81ms. Threshold chronically elevated per notes (2.5V @ 1.42ms at last check in 04/2017). Histograms appropriate for patient and level of activity. <1% AT/AF burden, +Xarelto, total time 11.7hrs. 71 ventricular high rate episodes--available EGMs show 1:1 SVT and NSVT. Patient bi-ventricularly pacing 99% of the time. Device programmed with appropriate safety margins. Estimated longevity 1 year. Patient declines Latitude monitoring. ROV with [facility] on 01/16/18 and ROV with [facility] in 04/2018.

## 2017-10-19 ENCOUNTER — Ambulatory Visit
Admission: RE | Admit: 2017-10-19 | Discharge: 2017-10-19 | Disposition: A | Discharge status: Home / Self Care | Payer: Medicare Other | Source: Ambulatory Visit | Attending: General Surgery | Admitting: General Surgery

## 2017-10-19 DIAGNOSIS — R1900 Intra-abdominal and pelvic swelling, mass and lump, unspecified site: Secondary | ICD-10-CM

## 2017-10-19 DIAGNOSIS — K5730 Diverticulosis of large intestine without perforation or abscess without bleeding: Secondary | ICD-10-CM | POA: Diagnosis not present

## 2017-10-19 MED ORDER — IOPAMIDOL (ISOVUE-300) INJECTION 61%
100.0000 mL | Freq: Once | INTRAVENOUS | Status: AC | PRN
  Administered 2017-10-19: 100 mL via INTRAVENOUS

## 2017-10-26 DIAGNOSIS — M6281 Muscle weakness (generalized): Secondary | ICD-10-CM | POA: Diagnosis not present

## 2017-10-26 DIAGNOSIS — R2689 Other abnormalities of gait and mobility: Secondary | ICD-10-CM | POA: Diagnosis not present

## 2017-10-28 DIAGNOSIS — M6281 Muscle weakness (generalized): Secondary | ICD-10-CM | POA: Diagnosis not present

## 2017-10-28 DIAGNOSIS — R2689 Other abnormalities of gait and mobility: Secondary | ICD-10-CM | POA: Diagnosis not present

## 2017-11-02 DIAGNOSIS — M6281 Muscle weakness (generalized): Secondary | ICD-10-CM | POA: Diagnosis not present

## 2017-11-02 DIAGNOSIS — R2689 Other abnormalities of gait and mobility: Secondary | ICD-10-CM | POA: Diagnosis not present

## 2017-11-09 DIAGNOSIS — R2689 Other abnormalities of gait and mobility: Secondary | ICD-10-CM | POA: Diagnosis not present

## 2017-11-09 DIAGNOSIS — M6281 Muscle weakness (generalized): Secondary | ICD-10-CM | POA: Diagnosis not present

## 2017-11-15 DIAGNOSIS — R2689 Other abnormalities of gait and mobility: Secondary | ICD-10-CM | POA: Diagnosis not present

## 2017-11-15 DIAGNOSIS — M6281 Muscle weakness (generalized): Secondary | ICD-10-CM | POA: Diagnosis not present

## 2017-11-16 DIAGNOSIS — K432 Incisional hernia without obstruction or gangrene: Secondary | ICD-10-CM | POA: Diagnosis not present

## 2017-11-21 DIAGNOSIS — M6281 Muscle weakness (generalized): Secondary | ICD-10-CM | POA: Diagnosis not present

## 2017-11-21 DIAGNOSIS — R2689 Other abnormalities of gait and mobility: Secondary | ICD-10-CM | POA: Diagnosis not present

## 2017-11-29 DIAGNOSIS — M6281 Muscle weakness (generalized): Secondary | ICD-10-CM | POA: Diagnosis not present

## 2017-11-29 DIAGNOSIS — R2689 Other abnormalities of gait and mobility: Secondary | ICD-10-CM | POA: Diagnosis not present

## 2017-12-02 ENCOUNTER — Ambulatory Visit (INDEPENDENT_AMBULATORY_CARE_PROVIDER_SITE_OTHER): Payer: Medicare Other | Admitting: Primary Care

## 2017-12-02 ENCOUNTER — Encounter: Payer: Self-pay | Admitting: Primary Care

## 2017-12-02 VITALS — BP 118/70 | HR 54 | Ht 66.0 in | Wt 121.6 lb

## 2017-12-02 DIAGNOSIS — R053 Chronic cough: Secondary | ICD-10-CM

## 2017-12-02 DIAGNOSIS — J418 Mixed simple and mucopurulent chronic bronchitis: Secondary | ICD-10-CM | POA: Diagnosis not present

## 2017-12-02 DIAGNOSIS — R05 Cough: Secondary | ICD-10-CM | POA: Diagnosis not present

## 2017-12-02 DIAGNOSIS — R2689 Other abnormalities of gait and mobility: Secondary | ICD-10-CM | POA: Diagnosis not present

## 2017-12-02 DIAGNOSIS — M6281 Muscle weakness (generalized): Secondary | ICD-10-CM | POA: Diagnosis not present

## 2017-12-02 MED ORDER — AZITHROMYCIN 250 MG PO TABS: ORAL_TABLET | ORAL | 0 refills | Status: DC

## 2017-12-02 NOTE — Progress Notes (Signed)
@Patient  ID: [name], female    DOB: [birth year], 84 y.o.   MRN: [id number]  Chief Complaint  Patient presents with  . Acute Visit    cough and tickle few weeks    Referring provider: Gayland Curry, DO  HPI: 84 year female, former smoker (quit 1995, 15 pack year hx). PMH chronic bronchitis and sinusitis. Patient of Dr. Chase Caller, last seen on 03/16/17. Previously maintained on Spiriva, Flonase, allegra.  12/05/2017 Patient presents today for acute visit with complaints of not feeling well with cough. Unsure if related to allergies. Symptoms started off as a tickle in throat a few weeks ago, developed into a productive cough with yellow mucus. Continues taking allegra. She is not taking Flonase and did not feel any benefit from Spiriva. Declined CXR today. Patient states that she took Augmentin in Feb with no issues. Unable to do FENO. Afebrile.    Allergies  Allergen Reactions  . Hydrocodone Itching  . Cephalexin Itching and Swelling  . Ciprofloxacin     Not indicated due to aneurysm in aorta    . Doxycycline Other (See Comments)    Per pt: unknown  . Escitalopram     Dry mouth  . Levofloxacin     Not indicated due to aneurysm in aorta   . Moxifloxacin     Not indicated due to aneurysm in aorta   . Ofloxacin Other (See Comments)    Per pt: unknown  . Sertraline     insomnia  . Sulfamethoxazole Hives    Any sulfa meds.  . Tape Other (See Comments)    Burns skin.   . Latex Rash    "If pt. Makes contact with or wearing"  . Neomycin Rash    Immunization History  Administered Date(s) Administered  . Influenza Whole 12/28/2011, 03/08/2013  . Influenza, High Dose Seasonal PF 03/14/2017  . Influenza,inj,Quad PF,6+ Mos 01/08/2014, 11/20/2014, 01/26/2016  . Pneumococcal Polysaccharide-23 03/13/2008  . Tdap 03/16/2011    Past Medical History:  Diagnosis Date  . Allergy    all year   . Anxiety disorder   . Arthritis   . Barrett's esophagus 04/2008  . Benign  neoplasm of colon   . Blood transfusion without reported diagnosis    long ago per pt  . Cataract    removed both eyes  . CHF (congestive heart failure) ([place])   . Chronic airway obstruction, not elsewhere classified   . Complication of anesthesia    slow to wake up  . Depressive disorder, not elsewhere classified   . Diverticulitis   . Dysthymic disorder   . Erosive gastritis 08/2004  . GERD (gastroesophageal reflux disease)   . Hair thinning   . HTN (hypertension)    pt denies   . Hyperplastic polyps of stomach 06/2002  . Hypothyroidism   . ICD (implantable cardiac defibrillator) in place    BiV/ICD; s/p removal of ICD with insertion of BiV PPM 04/26/12  . Insomnia 08/20/2015  . Kyphosis 08/20/2015  . LBBB (left bundle branch block)   . Melanoma ([place])    right arm  . Mitral regurgitation   . Nonischemic cardiomyopathy ([place])    EF initially 20%; Last measurement up to 50% per echo in August of 2012  . Osteopenia   . Other and unspecified hyperlipidemia   . Other and unspecified hyperlipidemia   . Pacemaker 04/26/2012  . Pain in joint, lower leg   . Palpitations   . Restless legs syndrome (RLS)   .  Synovial cyst of popliteal space   . Unspecified chronic bronchitis ([place])   . Unspecified nasal polyp   . Unspecified sinusitis (chronic)     Tobacco History: Social History   Tobacco Use  Smoking Status Former Smoker  . Packs/day: 1.00  . Years: 15.00  . Pack years: 15.00  . Types: Cigarettes  . Last attempt to quit: 12/22/1993  . Years since quitting: 23.9  Smokeless Tobacco Never Used  Tobacco Comment   Does'nt reall year quit    Counseling given: Not Answered Comment: Does'nt reall year quit    Outpatient Medications Prior to Visit  Medication Sig Dispense Refill  . acetaminophen (TYLENOL) 500 MG tablet Take 500 mg by mouth every 6 (six) hours as needed for pain. For pain    . Ascorbic Acid (VITAMIN C) 1000 MG tablet Take 1,000 mg by mouth daily.    . Biotin  5000 MCG TABS Take 1 tablet by mouth daily.    . Calcium Carbonate-Vitamin D (CALCIUM + D PO) Take 2 tablets by mouth daily.     . Cyanocobalamin (VITAMIN B 12 PO) Take 1 tablet by mouth daily.     Marland Kitchen ESTRACE VAGINAL 0.1 MG/GM vaginal cream Place 1 g vaginally 2 (two) times a week.     . furosemide (LASIX) 20 MG tablet Take 1 tablet (20 mg total) by mouth daily as needed for fluid or edema. 30 tablet 6  . Magnesium 250 MG TABS Take 1 tablet by mouth daily.     . Multiple Vitamin (MULTIVITAMIN WITH MINERALS) TABS Take 1 tablet by mouth daily.    . potassium chloride SA (K-DUR,KLOR-CON) 20 MEQ tablet Take 1 tablet (20 mEq total) by mouth daily as needed (only with lasix). 30 tablet 6  . RABEprazole (ACIPHEX) 20 MG tablet TAKE 1 TABLET ONCE DAILY   FOR STOMACH *LUPIN MFR* 90 tablet 3  . rivaroxaban (XARELTO) 20 MG TABS tablet TAKE 1 TABLET DAILY WITH   SUPPER 90 tablet 3  . SYNTHROID 100 MCG tablet TAKE 1 TABLET (100 MCG TOTAL) BY MOUTH DAILY BEFORE BREAKFAST. 90 tablet 0  . trandolapril (MAVIK) 2 MG tablet TAKE 1 TABLET BY MOUTH TWICE A DAY 180 tablet 3  . ALPRAZolam (XANAX) 0.25 MG tablet Take 1 tablet by mouth at bedtime as needed for sleep.  4  . tamsulosin (FLOMAX) 0.4 MG CAPS capsule Take 0.4 mg by mouth daily.  2  . traZODone (DESYREL) 50 MG tablet Take 1/4 tablet nightly for sleep and depression (Patient not taking: Reported on 12/02/2017) 15 tablet 5   Facility-Administered Medications Prior to Visit  Medication Dose Route Frequency Provider Last Rate Last Dose  . 0.9 %  sodium chloride infusion  500 mL Intravenous Once Milus Banister, MD        Review of Systems  Review of Systems  Constitutional: Negative.   HENT: Positive for congestion, postnasal drip, sinus pressure and sneezing.   Respiratory: Positive for cough. Negative for chest tightness, shortness of breath and wheezing.   Cardiovascular: Negative.     Physical Exam  BP 118/70 (BP Location: Right Arm, Cuff Size:  Normal)   Pulse (!) 54   Ht 5\' 6"  (1.676 m)   Wt 121 lb 9.6 oz (55.2 kg)   SpO2 100%   BMI 19.63 kg/m  Physical Exam  Constitutional: She is oriented to person, place, and time. She appears well-developed and well-nourished.  HENT:  Head: Normocephalic and atraumatic.  Eyes: Pupils are equal, round,  and reactive to light. EOM are normal.  Neck: Normal range of motion. Neck supple.  Cardiovascular: Normal rate and regular rhythm.  Pulmonary/Chest: Effort normal. No respiratory distress. She has no wheezes. She has no rales.  CTA   Neurological: She is alert and oriented to person, place, and time.  Skin: Skin is warm and dry.  Psychiatric: She has a normal mood and affect. Her behavior is normal. Judgment and thought content normal.     Lab Results:  CBC    Component Value Date/Time   WBC 6.5 07/21/2017 1550   RBC 3.93 07/21/2017 1550   HGB 11.4 (L) 07/21/2017 1550   HGB 13.8 10/18/2014 0954   HGB 13.4 03/28/2009 1452   HGB 13.9 10/02/2008 1503   HCT 33.8 (L) 07/21/2017 1550   HCT 39.7 10/18/2014 0954   HCT 38.5 03/28/2009 1452   HCT 40.4 10/02/2008 1503   PLT 196 07/21/2017 1550   PLT 296 03/28/2009 1452   PLT 201 10/02/2008 1503   MCV 86.0 07/21/2017 1550   MCV 87 10/18/2014 0954   MCV 90 03/28/2009 1452   MCV 91.6 10/02/2008 1503   MCH 29.0 07/21/2017 1550   MCHC 33.7 07/21/2017 1550   RDW 13.3 07/21/2017 1550   RDW 13.5 10/18/2014 0954   RDW 11.0 03/28/2009 1452   RDW 12.4 10/02/2008 1503   LYMPHSABS 1,580 07/21/2017 1550   LYMPHSABS 1.3 10/18/2014 0954   LYMPHSABS 1.6 03/28/2009 1452   LYMPHSABS 1.4 10/02/2008 1503   MONOABS 0.7 10/20/2016 1654   MONOABS 0.6 10/02/2008 1503   EOSABS 286 07/21/2017 1550   EOSABS 0.3 10/18/2014 0954   EOSABS 0.2 03/28/2009 1452   BASOSABS 33 07/21/2017 1550   BASOSABS 0.0 10/18/2014 0954   BASOSABS 0.0 03/28/2009 1452   BASOSABS 0.0 10/02/2008 1503    BMET    Component Value Date/Time   NA 134 (L) 07/21/2017 1550     NA 137 08/18/2015 0943   K 4.6 07/21/2017 1550   CL 103 07/21/2017 1550   CO2 28 07/21/2017 1550   GLUCOSE 87 07/21/2017 1550   BUN 34 (H) 07/21/2017 1550   BUN 18 08/18/2015 0943   CREATININE 0.79 07/21/2017 1550   CALCIUM 9.1 07/21/2017 1550   GFRNONAA 70 07/21/2017 1550   GFRAA 81 07/21/2017 1550    BNP No results found for: BNP  ProBNP    Component Value Date/Time   PROBNP 111.0 (H) 06/08/2011 1440    Imaging: No results found.   Assessment & Plan:   Chronic bronchitis ([place]) Symptoms consistent with acute URI/bronchitis Recommended mucinex and delsym OTC twice daily Zpack as directed  Return if symptoms do not improve      Martyn Ehrich, NP 12/05/2017

## 2017-12-02 NOTE — Patient Instructions (Addendum)
Symptoms consistent with URI/bronchitis  Mucinex twice daily x 1 week  Delsym cough syrup twice day x 1 week  [place] spray nasal spray  Zpack as prescribed   Return if no improvement

## 2017-12-05 ENCOUNTER — Encounter: Payer: Self-pay | Admitting: Primary Care

## 2017-12-05 NOTE — Assessment & Plan Note (Addendum)
Symptoms consistent with acute URI/bronchitis Recommended mucinex and delsym OTC twice daily Continue Allegra  Zpack as directed  Return if symptoms do not improve

## 2017-12-06 DIAGNOSIS — M6281 Muscle weakness (generalized): Secondary | ICD-10-CM | POA: Diagnosis not present

## 2017-12-06 DIAGNOSIS — R2689 Other abnormalities of gait and mobility: Secondary | ICD-10-CM | POA: Diagnosis not present

## 2017-12-08 ENCOUNTER — Ambulatory Visit (INDEPENDENT_AMBULATORY_CARE_PROVIDER_SITE_OTHER): Payer: Medicare Other | Admitting: Internal Medicine

## 2017-12-08 ENCOUNTER — Encounter: Payer: Self-pay | Admitting: Internal Medicine

## 2017-12-08 VITALS — BP 118/60 | HR 55 | Temp 97.5°F | Ht 66.0 in | Wt 121.0 lb

## 2017-12-08 DIAGNOSIS — I714 Abdominal aortic aneurysm, without rupture, unspecified: Secondary | ICD-10-CM

## 2017-12-08 DIAGNOSIS — F331 Major depressive disorder, recurrent, moderate: Secondary | ICD-10-CM

## 2017-12-08 DIAGNOSIS — G8929 Other chronic pain: Secondary | ICD-10-CM

## 2017-12-08 DIAGNOSIS — E039 Hypothyroidism, unspecified: Secondary | ICD-10-CM | POA: Diagnosis not present

## 2017-12-08 DIAGNOSIS — Z23 Encounter for immunization: Secondary | ICD-10-CM

## 2017-12-08 DIAGNOSIS — M545 Low back pain, unspecified: Secondary | ICD-10-CM

## 2017-12-08 DIAGNOSIS — I2584 Coronary atherosclerosis due to calcified coronary lesion: Secondary | ICD-10-CM | POA: Diagnosis not present

## 2017-12-08 DIAGNOSIS — I2510 Atherosclerotic heart disease of native coronary artery without angina pectoris: Secondary | ICD-10-CM

## 2017-12-08 DIAGNOSIS — E440 Moderate protein-calorie malnutrition: Secondary | ICD-10-CM

## 2017-12-08 DIAGNOSIS — D649 Anemia, unspecified: Secondary | ICD-10-CM | POA: Diagnosis not present

## 2017-12-08 MED ORDER — BUPROPION HCL ER (XL) 150 MG PO TB24: 150.0000 mg | ORAL_TABLET | Freq: Every day | ORAL | 3 refills | Status: DC

## 2017-12-08 NOTE — Progress Notes (Signed)
Location:  [facility] clinic Provider:  Armella Stogner L. Mariea Clonts, D.O., C.M.D.  Goals of Care:  Advanced Directives 07/21/2017  Does Patient Have a Medical Advance Directive? Yes  Type of Advance Directive -  Does patient want to make changes to medical advance directive? No - Patient declined  Copy of [place] in Chart? No - copy requested  Would patient like information on creating a medical advance directive? -  Pre-existing out of facility DNR order (yellow form or pink MOST form) -   Chief Complaint  Patient presents with  . Medical Management of Chronic Issues    64mth follow-up    HPI: Patient is a 84 y.o. female seen today for medical management of chronic diseases.    We changed her levothyroxine in May.  She feels no different.  Still is not gaining weight.  Says she eats a lot of calories.  She's going to PT for her back. Has 3 more visits.  Enjoys it except to get out and go twice a week.  She feels stronger in her arms and legs.  She still has pain. It's not constant.  Comes on over the course of the day--sometimes morning, sometimes at night.  Feels useless when pain bad.  Tylenol usually helps.   Thinks she can keep doing those exercises.  Has used a topical on her back also.  Pain stays in her back, not radicular.    Went to see Dr. [name] for her hernia which is incisional from her hysterectomy.  She's afraid she'll have her intestines entangled and end up real sick like happened before.  She's also not really ready to have a surgery again now though.  Sees Dr. Scot Dock next month.  She's going to ask him about the aortic aneurysms mentioned on her CT abdomen done for the hernia.    Gets weak spells and gets real hot, then it goes away.    She still gets up frequently to urinate.  Does not get a decent night's sleep.   Says she grew something on her hand. Thought another keratosis was coming.   Past Medical History:  Diagnosis Date  . Allergy    all year     . Anxiety disorder   . Arthritis   . Barrett's esophagus 04/2008  . Benign neoplasm of colon   . Blood transfusion without reported diagnosis    long ago per pt  . Cataract    removed both eyes  . CHF (congestive heart failure) ([place])   . Chronic airway obstruction, not elsewhere classified   . Complication of anesthesia    slow to wake up  . Depressive disorder, not elsewhere classified   . Diverticulitis   . Dysthymic disorder   . Erosive gastritis 08/2004  . GERD (gastroesophageal reflux disease)   . Hair thinning   . HTN (hypertension)    pt denies   . Hyperplastic polyps of stomach 06/2002  . Hypothyroidism   . ICD (implantable cardiac defibrillator) in place    BiV/ICD; s/p removal of ICD with insertion of BiV PPM 04/26/12  . Insomnia 08/20/2015  . Kyphosis 08/20/2015  . LBBB (left bundle branch block)   . Melanoma ([place])    right arm  . Mitral regurgitation   . Nonischemic cardiomyopathy ([place])    EF initially 20%; Last measurement up to 50% per echo in August of 2012  . Osteopenia   . Other and unspecified hyperlipidemia   . Other and unspecified hyperlipidemia   .  Pacemaker 04/26/2012  . Pain in joint, lower leg   . Palpitations   . Restless legs syndrome (RLS)   . Synovial cyst of popliteal space   . Unspecified chronic bronchitis ([place])   . Unspecified nasal polyp   . Unspecified sinusitis (chronic)     Past Surgical History:  Procedure Laterality Date  . ABDOMINAL HYSTERECTOMY  1076   endometriosis  . APPENDECTOMY    . BI-VENTRICULAR PACEMAKER INSERTION N/A 04/26/2012   Procedure: BI-VENTRICULAR PACEMAKER INSERTION (CRT-P);  Surgeon: Evans Lance, MD;  Location[facility] C[facility] LAB;  Service: Cardiovascular;  Laterality: N/A;  . COLONOSCOPY    . defibrillator insertion     s/p removal of previously implanted BiV ICD and insertion of a new BiV pacemaker on 04/26/12  . excise mole of lip  2001   Dr. Larena Sox  . excision of wen  1984   Dr. Parks Ranger  . FEMORAL  HERNIA REPAIR  12/25/2012   Dr Dalbert Batman  . FEMORAL HERNIA REPAIR  01/04/2013   Recurrent - Dr. Lilyan Punt  . INCISIONAL HERNIA REPAIR N/A 10/01/2013   Procedure: LAPAROSCOPIC INCISIONAL HERNIA;  Surgeon: Gayland Curry, MD;  Location: [facility];  Service: General;  Laterality: N/A;  . INGUINAL HERNIA REPAIR Right 09/26/2012   Procedure: RIGHT INGUINAL HERNIA REPAIR WITH MESH;  Surgeon: Odis Hollingshead, MD;  Location: [place];  Service: General;  Laterality: Right;  . INGUINAL HERNIA REPAIR Right 12/25/2012   Procedure: explor right groin, small bowel rescection, tissue repair right femoral hernia;  Surgeon: Adin Hector, MD;  Location: [facility];  Service: General;  Laterality: Right;  . INSERTION OF MESH Right 09/26/2012   Procedure: INSERTION OF MESH;  Surgeon: Odis Hollingshead, MD;  Location: [place];  Service: General;  Laterality: Right;  . INSERTION OF MESH N/A 10/01/2013   Procedure: INSERTION OF MESH;  Surgeon: Gayland Curry, MD;  Location: [facility];  Service: General;  Laterality: N/A;  . LAPAROSCOPY N/A 01/04/2013   Procedure: Diagnostic Laparoscopy, exploratory laparotomy with small bowel resection, closure of right femoral hernia repair;  Surgeon: Madilyn Hook, DO;  Location: [facility];  Service: General;  Laterality: N/A;  . Left arm surgery    . POLYPECTOMY    . RIGHT BREAST LUMPECTOMY  1988   Dr. Marylene Buerger  . right knee surgery     arthroscopy: Dr. Shellia Carwin  . TONSILLECTOMY    . UPPER GASTROINTESTINAL ENDOSCOPY      Allergies  Allergen Reactions  . Hydrocodone Itching  . Cephalexin Itching and Swelling  . Ciprofloxacin     Not indicated due to aneurysm in aorta    . Doxycycline Other (See Comments)    Per pt: unknown  . Escitalopram     Dry mouth  . Levofloxacin     Not indicated due to aneurysm in aorta   . Moxifloxacin     Not indicated due to aneurysm in aorta   . Ofloxacin Other (See Comments)    Per pt: unknown  . Sertraline     insomnia  . Sulfamethoxazole Hives     Any sulfa meds.  . Tape Other (See Comments)    Burns skin.   . Latex Rash    "If pt. Makes contact with or wearing"  . Neomycin Rash    Outpatient Encounter Medications as of 12/08/2017  Medication Sig  . acetaminophen (TYLENOL) 500 MG tablet Take 500 mg by mouth every 6 (six) hours as needed for pain. For pain  .  Ascorbic Acid (VITAMIN C) 1000 MG tablet Take 1,000 mg by mouth daily.  . Biotin 5000 MCG TABS Take 1 tablet by mouth daily.  . Calcium Carbonate-Vitamin D (CALCIUM + D PO) Take 2 tablets by mouth daily.   . Cyanocobalamin (VITAMIN B 12 PO) Take 1 tablet by mouth daily.   Marland Kitchen ESTRACE VAGINAL 0.1 MG/GM vaginal cream Place 1 g vaginally 2 (two) times a week.   . furosemide (LASIX) 20 MG tablet Take 1 tablet (20 mg total) by mouth daily as needed for fluid or edema.  . Magnesium 250 MG TABS Take 1 tablet by mouth daily.   . Multiple Vitamin (MULTIVITAMIN WITH MINERALS) TABS Take 1 tablet by mouth daily.  . potassium chloride SA (K-DUR,KLOR-CON) 20 MEQ tablet Take 1 tablet (20 mEq total) by mouth daily as needed (only with lasix).  . RABEprazole (ACIPHEX) 20 MG tablet TAKE 1 TABLET ONCE DAILY   FOR STOMACH *LUPIN MFR*  . rivaroxaban (XARELTO) 20 MG TABS tablet TAKE 1 TABLET DAILY WITH   SUPPER  . SYNTHROID 100 MCG tablet TAKE 1 TABLET (100 MCG TOTAL) BY MOUTH DAILY BEFORE BREAKFAST.  Marland Kitchen trandolapril (MAVIK) 2 MG tablet TAKE 1 TABLET BY MOUTH TWICE A DAY  . [DISCONTINUED] ALPRAZolam (XANAX) 0.25 MG tablet Take 1 tablet by mouth at bedtime as needed for sleep.  . [DISCONTINUED] azithromycin (ZITHROMAX) 250 MG tablet Zpack taper as directed  . [DISCONTINUED] tamsulosin (FLOMAX) 0.4 MG CAPS capsule Take 0.4 mg by mouth daily.  . [DISCONTINUED] traZODone (DESYREL) 50 MG tablet Take 1/4 tablet nightly for sleep and depression (Patient not taking: Reported on 12/02/2017)   Facility-Administered Encounter Medications as of 12/08/2017  Medication  . 0.9 %  sodium chloride infusion    Review  of Systems:  Review of Systems  Constitutional: Positive for malaise/fatigue. Negative for chills, fever and weight loss.       Remains underweight  HENT: Negative for congestion.   Eyes:       Wears reading glasses  Respiratory: Negative for cough, sputum production and shortness of breath.        Denies current smoking  Cardiovascular: Negative for chest pain, palpitations and leg swelling.  Gastrointestinal: Negative for abdominal pain, blood in stool, constipation and melena.  Genitourinary: Negative for dysuria.  Musculoskeletal: Positive for back pain and joint pain. Negative for falls.       New nodule on CMP joint noted  Skin: Negative for itching and rash.  Neurological: Negative for dizziness and loss of consciousness.  Endo/Heme/Allergies: Bruises/bleeds easily.  Psychiatric/Behavioral: Positive for depression. Negative for memory loss. The patient is nervous/anxious. The patient does not have insomnia.     Health Maintenance  Topic Date Due  . PNA vac Low Risk Adult (2 of 2 - PCV13) 03/13/2009  . INFLUENZA VACCINE  10/06/2017  . MAMMOGRAM  09/14/2018  . TETANUS/TDAP  03/15/2021  . DEXA SCAN  Completed    Physical Exam: Vitals:   12/08/17 1505  BP: 118/60  Pulse: (!) 55  Temp: (!) 97.5 F (36.4 C)  TempSrc: Oral  SpO2: 99%  Weight: 121 lb (54.9 kg)  Height: 5\' 6"  (1.676 m)   Body mass index is 19.53 kg/m. Physical Exam  Constitutional: She is oriented to person, place, and time. No distress.  Cachectic female  HENT:  Head: Normocephalic and atraumatic.  glasses  Eyes: Pupils are equal, round, and reactive to light. EOM are normal.  Neck: Neck supple.  Cardiovascular: Normal rate, regular rhythm,  normal heart sounds and intact distal pulses.  Pulmonary/Chest: Effort normal. No respiratory distress. She has wheezes.  Abdominal: Soft. Bowel sounds are normal. She exhibits no distension. There is no tenderness. There is no rebound and no guarding. A hernia  is present.  Musculoskeletal: She exhibits tenderness.  Of lower back; CMC nodule of left hand  Neurological: She is alert and oriented to person, place, and time.  Skin: Skin is warm and dry. Capillary refill takes less than 2 seconds.  Psychiatric: She has a normal mood and affect.    Labs reviewed: Basic Metabolic Panel: Recent Labs    04/28/17 1642 07/21/17 1550  NA  --  134*  K  --  4.6  CL  --  103  CO2  --  28  GLUCOSE  --  87  BUN 19 34*  CREATININE 0.73 0.79  CALCIUM  --  9.1  TSH  --  0.31*   Liver Function Tests: Recent Labs    07/21/17 1550  AST 21  ALT 13  BILITOT 0.3  PROT 6.2   No results for input(s): LIPASE, AMYLASE in the last 8760 hours. No results for input(s): AMMONIA in the last 8760 hours. CBC: Recent Labs    07/21/17 1550  WBC 6.5  NEUTROABS 3,855  HGB 11.4*  HCT 33.8*  MCV 86.0  PLT 196   Lipid Panel: Recent Labs    07/21/17 1550  CHOL 143  HDL 65  LDLCALC 58  TRIG 117  CHOLHDL 2.2  Assessment/Plan 1. Need for influenza vaccination - Flu vaccine HIGH DOSE PF (Fluzone High dose)  2. Hypothyroidism, unspecified type -cont current levothyroxine, recheck tsh - TSH  3. Moderate malnutrition ([place]) -ongoing issue--is very picky eater--is eating a lot of high calorie not very nutritious foods, does not gain weight  4. Moderate episode of recurrent major depressive disorder ([place]) - finally agrees to try something for her mood -first we talked about remeron which I preferred her to take, but she wanted something that would give her more energy in the daytime so we opted to try wellbutrin -she would benefit from weight gain and better rest with remeron though -will see how it goes - buPROPion (WELLBUTRIN XL) 150 MG 24 hr tablet; Take 1 tablet (150 mg total) by mouth daily.  Dispense: 30 tablet; Refill: 3  5. Chronic midline low back pain without sciatica -completing PT for this -has had some strength improvements, but still has  pain -reviewed continued conservative mgt as above and maintaining her exercises from therapy when it's complete -also reviewed that full resolution of pain is not a reasonable expectation and she understands this  6. AAA (abdominal aortic aneurysm) without rupture (H[facility]) -has f/u with Dr. Scot Dock and imaging showed two small aneurysms when she had her incisional hernia CT from Dr. [name]  7. Anemia, unspecified type - due to her malaise/fatigue, recheck cbc - CBC with Differential/Platelet  8.  CAD:  Noted on CT scan also -secondary prevention   Considered screening CT for her, but realized she's had a CT chest with pulmonary 09/07/2016 that showed "scattered tiny pulmonary nodules all stable since 2010 and considered benign", also showed "left main and 3 vessel coronary atherosclerosis"  Labs/tests ordered:   Orders Placed This Encounter  Procedures  . Flu vaccine HIGH DOSE PF (Fluzone High dose)  . TSH  . CBC with Differential/Platelet   Next appt:  04/20/2018 med mgt    Freyja Govea L. Enez Monahan, D.O. [place]  [place], [place] [address] Cell Phone (Mon-Fri 8am-5pm):  [phone] On Call:  [phone] & follow prompts after 5pm & weekends Office Phone:  [phone] Office Fax:  [phone]

## 2017-12-09 DIAGNOSIS — I2584 Coronary atherosclerosis due to calcified coronary lesion: Secondary | ICD-10-CM

## 2017-12-09 DIAGNOSIS — I2510 Atherosclerotic heart disease of native coronary artery without angina pectoris: Secondary | ICD-10-CM | POA: Insufficient documentation

## 2017-12-09 LAB — CBC WITH DIFFERENTIAL/PLATELET
Basophils Absolute: 29 cells/uL (ref 0–200)
Basophils Relative: 0.4 %
Eosinophils Absolute: 324 cells/uL (ref 15–500)
Eosinophils Relative: 4.5 %
HCT: 33.8 % — ABNORMAL LOW (ref 35.0–45.0)
Hemoglobin: 11.5 g/dL — ABNORMAL LOW (ref 11.7–15.5)
Lymphs Abs: 1656 cells/uL (ref 850–3900)
MCH: 30.5 pg (ref 27.0–33.0)
MCHC: 34 g/dL (ref 32.0–36.0)
MCV: 89.7 fL (ref 80.0–100.0)
MPV: 10 fL (ref 7.5–12.5)
Monocytes Relative: 11.4 %
Neutro Abs: 4370 cells/uL (ref 1500–7800)
Neutrophils Relative %: 60.7 %
Platelets: 189 10*3/uL (ref 140–400)
RBC: 3.77 10*6/uL — ABNORMAL LOW (ref 3.80–5.10)
RDW: 13.2 % (ref 11.0–15.0)
Total Lymphocyte: 23 %
WBC mixed population: 821 cells/uL (ref 200–950)
WBC: 7.2 10*3/uL (ref 3.8–10.8)

## 2017-12-09 LAB — TSH: TSH: 0.75 mIU/L (ref 0.40–4.50)

## 2017-12-13 DIAGNOSIS — M6281 Muscle weakness (generalized): Secondary | ICD-10-CM | POA: Diagnosis not present

## 2017-12-13 DIAGNOSIS — R2689 Other abnormalities of gait and mobility: Secondary | ICD-10-CM | POA: Diagnosis not present

## 2017-12-22 NOTE — Progress Notes (Signed)
[name] Date of Birth: Jan 25, 1933 Medical Record #[id number]  History of Present Illness: [name] is seen today for follow up. She has multiple medical issues which include a cardiomyopathy, underlying BiV/ICD which resulted in improvement of her EF, LBBB, HTN, thyroid disease, melanoma, anxiety/depression and chronic systolic HF.   In September 2018 she was noted in device clinic to have paroxysmal Afib with controlled rate. She was started on Eliquis. [place] Vasc score of at least 4. She developed itching on Eliquis and switched to Xarelto. Itching did not go away so she was switched to Coumadin. After about a month the itching resolved.  She was later switched back to Xarelto without rash. Last ICD check in August was satisfactory but she is getting closer to ERI.   On follow up today she has a lot of complaints but really doing quite well from a cardiac standpoint. No dyspnea, chest pain,  palpitations. Mild RLE edema. Notes Cologuard test was abnormal but colonoscopy in April showed only diverticular disease. She is concerned about an incisional hernia. Saw Dr. Redmond [name]. CT negative. Aortic and iliac aneurysms relatively stable. Followed by Dr [name].    Current Outpatient Medications  Medication Sig Dispense Refill  . acetaminophen (TYLENOL) 500 MG tablet Take 500 mg by mouth every 6 (six) hours as needed for pain. For pain    . Ascorbic Acid (VITAMIN C) 1000 MG tablet Take 1,000 mg by mouth daily.    . Biotin 5000 MCG TABS Take 1 tablet by mouth daily.    Marland Kitchen buPROPion (WELLBUTRIN XL) 150 MG 24 hr tablet Take 1 tablet (150 mg total) by mouth daily. 30 tablet 3  . Calcium Carbonate-Vitamin D (CALCIUM + D PO) Take 2 tablets by mouth daily.     . Cyanocobalamin (VITAMIN B 12 PO) Take 1 tablet by mouth daily.     Marland Kitchen ESTRACE VAGINAL 0.1 MG/GM vaginal cream Place 1 g vaginally 2 (two) times a week.     . furosemide (LASIX) 20 MG tablet Take 1 tablet (20 mg total) by mouth daily as needed  for fluid or edema. 30 tablet 6  . Magnesium 250 MG TABS Take 1 tablet by mouth daily.     . Multiple Vitamin (MULTIVITAMIN WITH MINERALS) TABS Take 1 tablet by mouth daily.    . potassium chloride SA (K-DUR,KLOR-CON) 20 MEQ tablet Take 1 tablet (20 mEq total) by mouth daily as needed (only with lasix). 30 tablet 6  . RABEprazole (ACIPHEX) 20 MG tablet TAKE 1 TABLET ONCE DAILY   FOR STOMACH *LUPIN MFR* 90 tablet 3  . rivaroxaban (XARELTO) 20 MG TABS tablet TAKE 1 TABLET DAILY WITH   SUPPER 90 tablet 3  . SYNTHROID 100 MCG tablet TAKE 1 TABLET (100 MCG TOTAL) BY MOUTH DAILY BEFORE BREAKFAST. 90 tablet 0  . trandolapril (MAVIK) 2 MG tablet TAKE 1 TABLET BY MOUTH TWICE A DAY 180 tablet 3   Current Facility-Administered Medications  Medication Dose Route Frequency Provider Last Rate Last Dose  . 0.9 %  sodium chloride infusion  500 mL Intravenous Once [name] Banister, MD        Allergies  Allergen Reactions  . Hydrocodone Itching  . Cephalexin Itching and Swelling  . Ciprofloxacin     Not indicated due to aneurysm in aorta    . Doxycycline Other (See Comments)    Per pt: unknown  . Escitalopram     Dry mouth  . Levofloxacin     Not indicated  due to aneurysm in aorta   . Moxifloxacin     Not indicated due to aneurysm in aorta   . Ofloxacin Other (See Comments)    Per pt: unknown  . Sertraline     insomnia  . Sulfamethoxazole Hives    Any sulfa meds.  . Tape Other (See Comments)    Burns skin.   . Latex Rash    "If pt. Makes contact with or wearing"  . Neomycin Rash    Past Medical History:  Diagnosis Date  . Allergy    all year   . Anxiety disorder   . Arthritis   . Barrett's esophagus 04/2008  . Benign neoplasm of colon   . Blood transfusion without reported diagnosis    long ago per pt  . Cataract    removed both eyes  . CHF (congestive heart failure) ([place])   . Chronic airway obstruction, not elsewhere classified   . Complication of anesthesia    slow to wake up   . Depressive disorder, not elsewhere classified   . Diverticulitis   . Dysthymic disorder   . Erosive gastritis 08/2004  . GERD (gastroesophageal reflux disease)   . Hair thinning   . HTN (hypertension)    pt denies   . Hyperplastic polyps of stomach 06/2002  . Hypothyroidism   . ICD (implantable cardiac defibrillator) in place    BiV/ICD; s/p removal of ICD with insertion of BiV PPM 04/26/12  . Insomnia 08/20/2015  . Kyphosis 08/20/2015  . LBBB (left bundle branch block)   . Melanoma ([place])    right arm  . Mitral regurgitation   . Nonischemic cardiomyopathy ([place])    EF initially 20%; Last measurement up to 50% per echo in August of 2012  . Osteopenia   . Other and unspecified hyperlipidemia   . Other and unspecified hyperlipidemia   . Pacemaker 04/26/2012  . Pain in joint, lower leg   . Palpitations   . Restless legs syndrome (RLS)   . Synovial cyst of popliteal space   . Unspecified chronic bronchitis ([place])   . Unspecified nasal polyp   . Unspecified sinusitis (chronic)     Past Surgical History:  Procedure Laterality Date  . ABDOMINAL HYSTERECTOMY  1076   endometriosis  . APPENDECTOMY    . BI-VENTRICULAR PACEMAKER INSERTION N/A 04/26/2012   Procedure: BI-VENTRICULAR PACEMAKER INSERTION (CRT-P);  Surgeon: [name] Lance, MD;  Location: [facility] CATH LAB;  Service: Cardiovascular;  Laterality: N/A;  . COLONOSCOPY    . defibrillator insertion     s/p removal of previously implanted BiV ICD and insertion of a new BiV pacemaker on 04/26/12  . excise mole of lip  2001   Dr. Larena [name]  . excision of wen  1984   Dr. Parks [name]  . FEMORAL HERNIA REPAIR  12/25/2012   Dr [name]  . FEMORAL HERNIA REPAIR  01/04/2013   Recurrent - Dr. Lilyan [name]  . INCISIONAL HERNIA REPAIR N/A 10/01/2013   Procedure: LAPAROSCOPIC INCISIONAL HERNIA;  Surgeon: [name] Curry, MD;  Location: [facility];  Service: General;  Laterality: N/A;  . INGUINAL HERNIA REPAIR Right 09/26/2012   Procedure: RIGHT INGUINAL HERNIA  REPAIR WITH MESH;  Surgeon: [name] Hollingshead, MD;  Location: [place];  Service: General;  Laterality: Right;  . INGUINAL HERNIA REPAIR Right 12/25/2012   Procedure: explor right groin, small bowel rescection, tissue repair right femoral hernia;  Surgeon: Adin Hector, MD;  Location: [facility];  Service: General;  Laterality: Right;  .  INSERTION OF MESH Right 09/26/2012   Procedure: INSERTION OF MESH;  Surgeon: [name] Hollingshead, MD;  Location: [place];  Service: General;  Laterality: Right;  . INSERTION OF MESH N/A 10/01/2013   Procedure: INSERTION OF MESH;  Surgeon: [name] Curry, MD;  Location: [facility];  Service: General;  Laterality: N/A;  . LAPAROSCOPY N/A 01/04/2013   Procedure: Diagnostic Laparoscopy, exploratory laparotomy with small bowel resection, closure of right femoral hernia repair;  Surgeon: Madilyn Hook, DO;  Location: [facility];  Service: General;  Laterality: N/A;  . Left arm surgery    . POLYPECTOMY    . RIGHT BREAST LUMPECTOMY  1988   Dr. Marylene Buerger  . right knee surgery     arthroscopy: Dr. Shellia Carwin  . TONSILLECTOMY    . UPPER GASTROINTESTINAL ENDOSCOPY      Social History   Tobacco Use  Smoking Status Former Smoker  . Packs/day: 1.00  . Years: 15.00  . Pack years: 15.00  . Types: Cigarettes  . Last attempt to quit: 12/22/1993  . Years since quitting: 24.0  Smokeless Tobacco Never Used  Tobacco Comment   Does'nt reall year quit     Social History   Substance and Sexual Activity  Alcohol Use No  . Alcohol/week: 0.0 standard drinks    Family History  Problem Relation Age of Onset  . Stroke Father   . Heart disease Other        maternal side  . Colon cancer Paternal Uncle   . Breast cancer Maternal Aunt   . Colon cancer Cousin   . Diabetes Neg Hx   . Colon polyps Neg Hx   . Rectal cancer Neg Hx   . Stomach cancer Neg Hx     Review of Systems: The review of systems is per the HPI.   All other systems were reviewed and are negative.  Physical  Exam: BP 122/70   Pulse (!) 54   Ht 5\' 8"  (1.727 m)   Wt 122 lb (55.3 kg)   BMI 18.55 kg/m  GENERAL:  Well appearing, elderly thin WF in NAD HEENT:  PERRL, EOMI, sclera are clear. Oropharynx is clear. NECK:  No jugular venous distention, carotid upstroke brisk and symmetric, no bruits, no thyromegaly or adenopathy LUNGS:  Clear to auscultation bilaterally CHEST:  Unremarkable HEART:  RRR,  PMI not displaced or sustained,S1 and S2 within normal limits, no S3, no S4: no clicks, no rubs, no murmurs ABD:  Soft, nontender. BS +, no masses or bruits. No hepatomegaly, no splenomegaly EXT:  2 + pulses throughout, no edema, no cyanosis no clubbing SKIN:  Warm and dry.  No rashes NEURO:  Alert and oriented x 3. Cranial nerves II through XII intact. PSYCH:  Cognitively intact      Wt Readings from Last 3 Encounters:  12/23/17 122 lb (55.3 kg)  12/08/17 121 lb (54.9 kg)  12/02/17 121 lb 9.6 oz (55.2 kg)     LABORATORY DATA: Lab Results  Component Value Date   WBC 7.2 12/08/2017   HGB 11.5 (L) 12/08/2017   HCT 33.8 (L) 12/08/2017   PLT 189 12/08/2017   GLUCOSE 87 07/21/2017   CHOL 143 07/21/2017   TRIG 117 07/21/2017   HDL 65 07/21/2017   LDLCALC 58 07/21/2017   ALT 13 07/21/2017   AST 21 07/21/2017   NA 134 (L) 07/21/2017   K 4.6 07/21/2017   CL 103 07/21/2017   CREATININE 0.79 07/21/2017   BUN 34 (H) 07/21/2017  CO2 28 07/21/2017   TSH 0.75 12/08/2017   INR 2.1 01/26/2016    Echo: 07/22/16: Study Conclusions  - Left ventricle: The cavity size was mildly dilated. Wall   thickness was normal. Systolic function was normal. The estimated   ejection fraction was in the range of 50% to 55%. Wall motion was   normal; there were no regional wall motion abnormalities. - Aortic valve: There was trivial regurgitation. - Mitral valve: There was mild regurgitation. - Atrial septum: No defect or patent foramen ovale was identified.    Assessment / Plan: 1.  History of  dialted Cardiomyopathy with chronic systolic CHF.  EF last normal 55% in May 2018- has BiV/ICD in place.    She is asymptomatic.   2. NSVT. Follow up ICD/biV in device clinic.   3. Paroxysmal Afib. [place] Vasc score of at least 4. On Xarelto. Tolerating well.   4. Small AAA and left iliac aneurysm. Follow up with [facility]. Last doppler in August 2018 showed iliac diameter 1.6 cm and Aorta 2.4 cm. Recent CT of Abdomen/pelvis looked OK.   I will follow up in 6 months.

## 2017-12-23 ENCOUNTER — Ambulatory Visit (INDEPENDENT_AMBULATORY_CARE_PROVIDER_SITE_OTHER): Payer: Medicare Other | Admitting: Cardiology

## 2017-12-23 ENCOUNTER — Encounter: Payer: Self-pay | Admitting: Cardiology

## 2017-12-23 VITALS — BP 122/70 | HR 54 | Ht 68.0 in | Wt 122.0 lb

## 2017-12-23 DIAGNOSIS — Z950 Presence of cardiac pacemaker: Secondary | ICD-10-CM | POA: Diagnosis not present

## 2017-12-23 DIAGNOSIS — I2510 Atherosclerotic heart disease of native coronary artery without angina pectoris: Secondary | ICD-10-CM

## 2017-12-23 DIAGNOSIS — I447 Left bundle-branch block, unspecified: Secondary | ICD-10-CM

## 2017-12-23 DIAGNOSIS — I5022 Chronic systolic (congestive) heart failure: Secondary | ICD-10-CM | POA: Diagnosis not present

## 2017-12-23 DIAGNOSIS — I4891 Unspecified atrial fibrillation: Secondary | ICD-10-CM | POA: Diagnosis not present

## 2017-12-23 DIAGNOSIS — I2584 Coronary atherosclerosis due to calcified coronary lesion: Secondary | ICD-10-CM | POA: Diagnosis not present

## 2017-12-23 DIAGNOSIS — I714 Abdominal aortic aneurysm, without rupture, unspecified: Secondary | ICD-10-CM

## 2017-12-30 DIAGNOSIS — R2689 Other abnormalities of gait and mobility: Secondary | ICD-10-CM | POA: Diagnosis not present

## 2017-12-30 DIAGNOSIS — M6281 Muscle weakness (generalized): Secondary | ICD-10-CM | POA: Diagnosis not present

## 2018-01-03 ENCOUNTER — Other Ambulatory Visit: Payer: Self-pay | Admitting: Internal Medicine

## 2018-01-03 DIAGNOSIS — M6281 Muscle weakness (generalized): Secondary | ICD-10-CM | POA: Diagnosis not present

## 2018-01-03 DIAGNOSIS — F331 Major depressive disorder, recurrent, moderate: Secondary | ICD-10-CM

## 2018-01-03 DIAGNOSIS — R2689 Other abnormalities of gait and mobility: Secondary | ICD-10-CM | POA: Diagnosis not present

## 2018-01-04 ENCOUNTER — Ambulatory Visit: Payer: Medicare Other | Admitting: Vascular Surgery

## 2018-01-11 ENCOUNTER — Ambulatory Visit (INDEPENDENT_AMBULATORY_CARE_PROVIDER_SITE_OTHER): Payer: Medicare Other | Admitting: Vascular Surgery

## 2018-01-11 ENCOUNTER — Encounter: Payer: Self-pay | Admitting: Vascular Surgery

## 2018-01-11 VITALS — BP 123/70 | HR 55 | Temp 97.6°F | Resp 16 | Ht 68.0 in | Wt 122.0 lb

## 2018-01-11 DIAGNOSIS — I2510 Atherosclerotic heart disease of native coronary artery without angina pectoris: Secondary | ICD-10-CM

## 2018-01-11 DIAGNOSIS — I714 Abdominal aortic aneurysm, without rupture, unspecified: Secondary | ICD-10-CM

## 2018-01-11 DIAGNOSIS — I2584 Coronary atherosclerosis due to calcified coronary lesion: Secondary | ICD-10-CM

## 2018-01-11 NOTE — Progress Notes (Signed)
Patient name: [name] MRN: [id number] DOB: 1932/11/24 Sex: female  REASON FOR VISIT:   Follow-up of abdominal aortic aneurysm.  HPI:   [name] is a pleasant 84 y.o. female who I last saw on 10/27/2016.  At that time, the maximum diameter of the distal aorta was 2.4 cm.  The right common iliac artery measured 1.4 cm in maximum diameter.  The left common iliac artery measured 1.6 cm in maximum diameter.  I recommended a follow-up study in 2 years.  She was very anxious about this and requested that this be done yearly.  I did not think this was unreasonable.  The patient also had a history of chronic venous insufficiency we discussed conservative treatment for this.  She comes in for a one-year follow-up visit.  Since I saw her last she did have one bout of some mild abdominal pain which prompted the CT scan which was done in August of this year.  This reason I did not repeat her duplex today.  This pain has resolved.  She does have some issues with a hernia and she is followed by Dr. [name] with this.  She does complain of some issues with her varicose veins but it sounds like she is mostly concerned about the appearance of these she does not describe significant aching or heaviness in her legs.  She said no previous history of DVT.  Past Medical History:  Diagnosis Date  . Allergy    all year   . Anxiety disorder   . Arthritis   . Barrett's esophagus 04/2008  . Benign neoplasm of colon   . Blood transfusion without reported diagnosis    long ago per pt  . Cataract    removed both eyes  . CHF (congestive heart failure) ([place])   . Chronic airway obstruction, not elsewhere classified   . Complication of anesthesia    slow to wake up  . Depressive disorder, not elsewhere classified   . Diverticulitis   . Dysthymic disorder   . Erosive gastritis 08/2004  . GERD (gastroesophageal reflux disease)   . Hair thinning   . HTN (hypertension)    pt denies   . Hyperplastic  polyps of stomach 06/2002  . Hypothyroidism   . ICD (implantable cardiac defibrillator) in place    BiV/ICD; s/p removal of ICD with insertion of BiV PPM 04/26/12  . Insomnia 08/20/2015  . Kyphosis 08/20/2015  . LBBB (left bundle branch block)   . Melanoma ([place])    right arm  . Mitral regurgitation   . Nonischemic cardiomyopathy ([place])    EF initially 20%; Last measurement up to 50% per echo in August of 2012  . Osteopenia   . Other and unspecified hyperlipidemia   . Other and unspecified hyperlipidemia   . Pacemaker 04/26/2012  . Pain in joint, lower leg   . Palpitations   . Restless legs syndrome (RLS)   . Synovial cyst of popliteal space   . Unspecified chronic bronchitis ([place])   . Unspecified nasal polyp   . Unspecified sinusitis (chronic)     Family History  Problem Relation Age of Onset  . Stroke Father   . Heart disease Other        maternal side  . Colon cancer Paternal Uncle   . Breast cancer Maternal Aunt   . Colon cancer Cousin   . Diabetes Neg Hx   . Colon polyps Neg Hx   . Rectal cancer Neg Hx   .  Stomach cancer Neg Hx     SOCIAL HISTORY: Social History   Tobacco Use  . Smoking status: Former Smoker    Packs/day: 1.00    Years: 15.00    Pack years: 15.00    Types: Cigarettes    Last attempt to quit: 12/22/1993    Years since quitting: 24.0  . Smokeless tobacco: Never Used  . Tobacco comment: Does'nt reall year quit   Substance Use Topics  . Alcohol use: No    Alcohol/week: 0.0 standard drinks    Allergies  Allergen Reactions  . Hydrocodone Itching  . Cephalexin Itching and Swelling  . Ciprofloxacin     Not indicated due to aneurysm in aorta    . Doxycycline Other (See Comments)    Per pt: unknown  . Escitalopram     Dry mouth  . Levofloxacin     Not indicated due to aneurysm in aorta   . Moxifloxacin     Not indicated due to aneurysm in aorta   . Ofloxacin Other (See Comments)    Per pt: unknown  . Sertraline     insomnia  .  Sulfamethoxazole Hives    Any sulfa meds.  . Tape Other (See Comments)    Burns skin.   . Latex Rash    "If pt. Makes contact with or wearing"  . Neomycin Rash    Current Outpatient Medications  Medication Sig Dispense Refill  . acetaminophen (TYLENOL) 500 MG tablet Take 500 mg by mouth every 6 (six) hours as needed for pain. For pain    . Ascorbic Acid (VITAMIN C) 1000 MG tablet Take 1,000 mg by mouth daily.    . Biotin 5000 MCG TABS Take 1 tablet by mouth daily.    Marland Kitchen buPROPion (WELLBUTRIN XL) 150 MG 24 hr tablet TAKE 1 TABLET BY MOUTH EVERY DAY 90 tablet 1  . Calcium Carbonate-Vitamin D (CALCIUM + D PO) Take 2 tablets by mouth daily.     . Cyanocobalamin (VITAMIN B 12 PO) Take 1 tablet by mouth daily.     Marland Kitchen ESTRACE VAGINAL 0.1 MG/GM vaginal cream Place 1 g vaginally 2 (two) times a week.     . furosemide (LASIX) 20 MG tablet Take 1 tablet (20 mg total) by mouth daily as needed for fluid or edema. 30 tablet 6  . Magnesium 250 MG TABS Take 1 tablet by mouth daily.     . Multiple Vitamin (MULTIVITAMIN WITH MINERALS) TABS Take 1 tablet by mouth daily.    . potassium chloride SA (K-DUR,KLOR-CON) 20 MEQ tablet Take 1 tablet (20 mEq total) by mouth daily as needed (only with lasix). 30 tablet 6  . RABEprazole (ACIPHEX) 20 MG tablet TAKE 1 TABLET ONCE DAILY   FOR STOMACH *LUPIN MFR* 90 tablet 3  . rivaroxaban (XARELTO) 20 MG TABS tablet TAKE 1 TABLET DAILY WITH   SUPPER 90 tablet 3  . SYNTHROID 100 MCG tablet TAKE 1 TABLET (100 MCG TOTAL) BY MOUTH DAILY BEFORE BREAKFAST. 90 tablet 0  . trandolapril (MAVIK) 2 MG tablet TAKE 1 TABLET BY MOUTH TWICE A DAY 180 tablet 3   Current Facility-Administered Medications  Medication Dose Route Frequency Provider Last Rate Last Dose  . 0.9 %  sodium chloride infusion  500 mL Intravenous Once Milus Banister, MD        REVIEW OF SYSTEMS:  [X]  denotes positive finding, [ ]  denotes negative finding Cardiac  Comments:  Chest pain or chest pressure:  Shortness of breath upon exertion:    Short of breath when lying flat:    Irregular heart rhythm:        Vascular    Pain in calf, thigh, or hip brought on by ambulation:    Pain in feet at night that wakes you up from your sleep:     Blood clot in your veins:    Leg swelling:  x       Pulmonary    Oxygen at home:    Productive cough:     Wheezing:         Neurologic    Sudden weakness in arms or legs:     Sudden numbness in arms or legs:     Sudden onset of difficulty speaking or slurred speech:    Temporary loss of vision in one eye:     Problems with dizziness:         Gastrointestinal    Blood in stool:     Vomited blood:         Genitourinary    Burning when urinating:     Blood in urine:        Psychiatric    Major depression:         Hematologic    Bleeding problems:    Problems with blood clotting too easily:        Skin    Rashes or ulcers:        Constitutional    Fever or chills:     PHYSICAL EXAM:   Vitals:   01/11/18 1241  BP: 123/70  Pulse: (!) 55  Resp: 16  Temp: 97.6 F (36.4 C)  SpO2: 99%  Weight: 122 lb (55.3 kg)  Height: 5\' 8"  (1.727 m)    GENERAL: The patient is a well-nourished female, in no acute distress. The vital signs are documented above. CARDIAC: There is a regular rate and rhythm.  VASCULAR: I do not detect carotid bruits. She has palpable femoral and pedal pulses bilaterally. She has no significant lower extremity swelling. She has some dilated varicose veins of both legs and also telangiectasias and reticular veins bilaterally. PULMONARY: There is good air exchange bilaterally without wheezing or rales. ABDOMEN: Soft and non-tender with normal pitched bowel sounds.  MUSCULOSKELETAL: There are no major deformities or cyanosis. NEUROLOGIC: No focal weakness or paresthesias are detected. SKIN: There are no ulcers or rashes noted. PSYCHIATRIC: The patient has a normal affect.  DATA:    CT SCAN ABDOMEN PELVIS: I have  reviewed her CT scan of the abdomen and pelvis that was done in August of this year.  This shows that the maximum diameter of the infrarenal aorta is 2.9 cm.  The right common iliac artery is not especially dilated.  The left common iliac artery measured 2.1 cm in maximum diameter.  MEDICAL ISSUES:   SMALL ABDOMINAL AORTIC ANEURYSM AND LEFT COMMON ILIAC ARTERY ANEURYSM: There has been only slight increase in size of her small aneurysms.  As per my previous note she would like this followed on a yearly basis given that there is been some slight enlargement I think this is reasonable.  I have ordered a follow-up duplex scan in 1 year and I will see her back at that time.  She is not a smoker.  Her blood pressure is under good control.  CHRONIC VENOUS INSUFFICIENCY: The patient does have chronic venous insufficiency.  We have again discussed the importance of intermittent leg elevation the proper  positioning for this.  She does have some compression stockings.  If her symptoms progress then certainly perform a formal reflux study to see what options we might have to help her beyond the conservative measures which we have discussed.  Deitra Mayo [facility] [phone]

## 2018-01-12 ENCOUNTER — Other Ambulatory Visit: Payer: Self-pay | Admitting: Internal Medicine

## 2018-01-13 DIAGNOSIS — R2689 Other abnormalities of gait and mobility: Secondary | ICD-10-CM | POA: Diagnosis not present

## 2018-01-13 DIAGNOSIS — M6281 Muscle weakness (generalized): Secondary | ICD-10-CM | POA: Diagnosis not present

## 2018-01-16 ENCOUNTER — Ambulatory Visit (INDEPENDENT_AMBULATORY_CARE_PROVIDER_SITE_OTHER): Payer: Medicare Other | Admitting: *Deleted

## 2018-01-16 DIAGNOSIS — I447 Left bundle-branch block, unspecified: Secondary | ICD-10-CM

## 2018-01-16 DIAGNOSIS — I5022 Chronic systolic (congestive) heart failure: Secondary | ICD-10-CM

## 2018-01-16 DIAGNOSIS — I4891 Unspecified atrial fibrillation: Secondary | ICD-10-CM

## 2018-01-16 DIAGNOSIS — Z950 Presence of cardiac pacemaker: Secondary | ICD-10-CM

## 2018-01-16 LAB — CUP PACEART INCLINIC DEVICE CHECK
Implantable Lead Implant Date: 20070309
Implantable Lead Location: 753859
Implantable Lead Model: 158
Implantable Lead Model: 4087
Implantable Lead Serial Number: 268502
Implantable Pulse Generator Implant Date: 20140219
Lead Channel Impedance Value: 393 Ohm
Lead Channel Pacing Threshold Pulse Width: 0.4 ms
Lead Channel Sensing Intrinsic Amplitude: 2.2 mV
Lead Channel Setting Pacing Amplitude: 2.5 V
Lead Channel Setting Pacing Amplitude: 3.5 V
Lead Channel Setting Pacing Pulse Width: 1 ms
Lead Channel Setting Pacing Pulse Width: 1.2 ms
Lead Channel Setting Sensing Sensitivity: 2.5 mV
MDC IDC LEAD IMPLANT DT: 20070309
MDC IDC LEAD IMPLANT DT: 20070309
MDC IDC LEAD LOCATION: 753858
MDC IDC LEAD LOCATION: 753860
MDC IDC LEAD SERIAL: 121652
MDC IDC LEAD SERIAL: 168416
MDC IDC MSMT LEADCHNL LV IMPEDANCE VALUE: 505 Ohm
MDC IDC MSMT LEADCHNL LV PACING THRESHOLD AMPLITUDE: 1.5 V
MDC IDC MSMT LEADCHNL LV PACING THRESHOLD PULSEWIDTH: 1 ms
MDC IDC MSMT LEADCHNL LV SENSING INTR AMPL: 7.2 mV
MDC IDC MSMT LEADCHNL RA IMPEDANCE VALUE: 494 Ohm
MDC IDC MSMT LEADCHNL RA PACING THRESHOLD AMPLITUDE: 0.7 V
MDC IDC MSMT LEADCHNL RV PACING THRESHOLD AMPLITUDE: 3.2 V
MDC IDC MSMT LEADCHNL RV PACING THRESHOLD PULSEWIDTH: 1.2 ms
MDC IDC MSMT LEADCHNL RV SENSING INTR AMPL: 13.2 mV
MDC IDC SESS DTM: 20191111050000
MDC IDC SET LEADCHNL LV SENSING SENSITIVITY: 2.5 mV
MDC IDC SET LEADCHNL RA PACING AMPLITUDE: 2 V
Pulse Gen Serial Number: 100543

## 2018-01-16 NOTE — Progress Notes (Signed)
CRT-P device check in clinic. Normal device function. RA/LV threshold, sensing, impedance consistent with previous measurements. RV threshold continues to rise- now 3.2 @ 1.38ms, no change to output as patient is following up with [facility] in Feb. Histograms appropriate for patient and level of activity. 5.5 hrs AF- Xarelto. High ventricular rates episodes are AF RVR. Patient bi-ventricularly pacing 99% of the time. Device programmed with appropriate safety margins. Estimated longevity 10 months. Patient declines remote follow-up. ROV with [facility] 04/21/18.

## 2018-01-19 DIAGNOSIS — K432 Incisional hernia without obstruction or gangrene: Secondary | ICD-10-CM | POA: Diagnosis not present

## 2018-01-26 ENCOUNTER — Ambulatory Visit (INDEPENDENT_AMBULATORY_CARE_PROVIDER_SITE_OTHER): Payer: Medicare Other | Admitting: Nurse Practitioner

## 2018-01-26 ENCOUNTER — Encounter: Payer: Self-pay | Admitting: Nurse Practitioner

## 2018-01-26 ENCOUNTER — Ambulatory Visit
Admission: RE | Admit: 2018-01-26 | Discharge: 2018-01-26 | Disposition: A | Discharge status: Home / Self Care | Payer: Medicare Other | Source: Ambulatory Visit | Attending: Nurse Practitioner | Admitting: Nurse Practitioner

## 2018-01-26 VITALS — BP 120/72 | HR 65 | Temp 98.1°F | Ht 68.0 in | Wt 123.4 lb

## 2018-01-26 DIAGNOSIS — M25572 Pain in left ankle and joints of left foot: Secondary | ICD-10-CM | POA: Diagnosis not present

## 2018-01-26 DIAGNOSIS — S90512A Abrasion, left ankle, initial encounter: Secondary | ICD-10-CM

## 2018-01-26 DIAGNOSIS — I2584 Coronary atherosclerosis due to calcified coronary lesion: Secondary | ICD-10-CM

## 2018-01-26 DIAGNOSIS — I2510 Atherosclerotic heart disease of native coronary artery without angina pectoris: Secondary | ICD-10-CM | POA: Diagnosis not present

## 2018-01-26 DIAGNOSIS — S99912A Unspecified injury of left ankle, initial encounter: Secondary | ICD-10-CM | POA: Diagnosis not present

## 2018-01-26 NOTE — Patient Instructions (Signed)
To get xray of ankle when you are able  To apply xeroform daily until healed Monitor for drainage, heat, increase pain, redness and notify  Follow up with Janett Billow in 10 days on ankle

## 2018-01-26 NOTE — Progress Notes (Signed)
Careteam: Patient Care Team: Gayland Curry, DO as PCP - General (Geriatric Medicine) [name], Peter M, MD as Consulting Physician (Cardiology) Milus Banister, MD as Attending Physician (Gastroenterology)  Advanced Directive information Does Patient Have a Medical Advance Directive?: No  Allergies  Allergen Reactions  . Hydrocodone Itching  . Cephalexin Itching and Swelling  . Ciprofloxacin     Not indicated due to aneurysm in aorta    . Doxycycline Other (See Comments)    Per pt: unknown  . Escitalopram     Dry mouth  . Levofloxacin     Not indicated due to aneurysm in aorta   . Moxifloxacin     Not indicated due to aneurysm in aorta   . Ofloxacin Other (See Comments)    Per pt: unknown  . Sertraline     insomnia  . Sulfamethoxazole Hives    Any sulfa meds.  . Tape Other (See Comments)    Burns skin.   . Latex Rash    "If pt. Makes contact with or wearing"  . Neomycin Rash    Chief Complaint  Patient presents with  . Acute Visit    Pt is being seen due to left ankle injury. Pt fell and twisted ankle about 4 weeks ago. Pt has several scratches that are sore.      HPI: Patient is a 84 y.o. female seen in the office today due to left ankle pain. Lives at [employer] and had a hard time getting her papers in the recycle bin- lost her balance and fell on her back on the cement. 2 abrasions noted on her ankle. Left the bandaid on for several days and then it was sore.  Saw general surgeon who told her it was a bad bruise and not infected but she is worried about infection.  Reports ankle is still sore around scraps  Does not hurt when she walks on it. If she turn it a certain way then it will cause some discomfort.  Pain 3-4/10 when she turns it a certain way. Using bactin spray, alcohol and iodine with antibacterial ointment.    Review of Systems:  Review of Systems  Constitutional: Negative for chills and fever.  Musculoskeletal: Positive for falls,  joint pain and myalgias.    Past Medical History:  Diagnosis Date  . Allergy    all year   . Anxiety disorder   . Arthritis   . Barrett's esophagus 04/2008  . Benign neoplasm of colon   . Blood transfusion without reported diagnosis    long ago per pt  . Cataract    removed both eyes  . CHF (congestive heart failure) ([place])   . Chronic airway obstruction, not elsewhere classified   . Complication of anesthesia    slow to wake up  . Depressive disorder, not elsewhere classified   . Diverticulitis   . Dysthymic disorder   . Erosive gastritis 08/2004  . GERD (gastroesophageal reflux disease)   . Hair thinning   . HTN (hypertension)    pt denies   . Hyperplastic polyps of stomach 06/2002  . Hypothyroidism   . ICD (implantable cardiac defibrillator) in place    BiV/ICD; s/p removal of ICD with insertion of BiV PPM 04/26/12  . Insomnia 08/20/2015  . Kyphosis 08/20/2015  . LBBB (left bundle branch block)   . Melanoma ([place])    right arm  . Mitral regurgitation   . Nonischemic cardiomyopathy ([place])    EF initially 20%;  Last measurement up to 50% per echo in August of 2012  . Osteopenia   . Other and unspecified hyperlipidemia   . Other and unspecified hyperlipidemia   . Pacemaker 04/26/2012  . Pain in joint, lower leg   . Palpitations   . Restless legs syndrome (RLS)   . Synovial cyst of popliteal space   . Unspecified chronic bronchitis ([place])   . Unspecified nasal polyp   . Unspecified sinusitis (chronic)    Past Surgical History:  Procedure Laterality Date  . ABDOMINAL HYSTERECTOMY  1076   endometriosis  . APPENDECTOMY    . BI-VENTRICULAR PACEMAKER INSERTION N/A 04/26/2012   Procedure: BI-VENTRICULAR PACEMAKER INSERTION (CRT-P);  Surgeon: Evans Lance, MD;  Location: [facility] CATH LAB;  Service: Cardiovascular;  Laterality: N/A;  . COLONOSCOPY    . defibrillator insertion     s/p removal of previously implanted BiV ICD and insertion of a new BiV pacemaker on 04/26/12  . excise  mole of lip  2001   Dr. Larena Sox  . excision of wen  1984   Dr. Parks Ranger  . FEMORAL HERNIA REPAIR  12/25/2012   Dr Dalbert Batman  . FEMORAL HERNIA REPAIR  01/04/2013   Recurrent - Dr. Lilyan Punt  . INCISIONAL HERNIA REPAIR N/A 10/01/2013   Procedure: LAPAROSCOPIC INCISIONAL HERNIA;  Surgeon: Gayland Curry, MD;  [facility];  Service: General;  Laterality: N/A;  . INGUINAL HERNIA REPAIR Right 09/26/2012   Procedure: RIGHT INGUINAL HERNIA REPAIR WITH MESH;  Surgeon: Odis Hollingshead, MD;  Location: [place];  Service: General;  Laterality: Right;  . INGUINAL HERNIA REPAIR Right 12/25/2012   Procedure: explor right groin, small bowel rescection, tissue repair right femoral hernia;  Surgeon: Adin Hector, MD;  [facility];  Service: General;  Laterality: Right;  . INSERTION OF MESH Right 09/26/2012   Procedure: INSERTION OF MESH;  Surgeon: Odis Hollingshead, MD;  Location: [place];  Service: General;  Laterality: Right;  . INSERTION OF MESH N/A 10/01/2013   Procedure: INSERTION OF MESH;  Surgeon: Gayland Curry, MD;  [facility];  Service: General;  Laterality: N/A;  . LAPAROSCOPY N/A 01/04/2013   Procedure: Diagnostic Laparoscopy, exploratory laparotomy with small bowel resection, closure of right femoral hernia repair;  Surgeon: Madilyn Hook, DO;  [facility];  Service: General;  Laterality: N/A;  . Left arm surgery    . POLYPECTOMY    . RIGHT BREAST LUMPECTOMY  1988   Dr. Marylene Buerger  . right knee surgery     arthroscopy: Dr. Shellia Carwin  . TONSILLECTOMY    . UPPER GASTROINTESTINAL ENDOSCOPY     Social History:   reports that she quit smoking about 24 years ago. Her smoking use included cigarettes. She has a 15.00 pack-year smoking history. She has never used smokeless tobacco. She reports that she does not drink alcohol or use drugs.  Family History  Problem Relation Age of Onset  . Stroke Father   . Heart disease Other        maternal side  . Colon cancer Paternal Uncle   .  Breast cancer Maternal Aunt   . Colon cancer Cousin   . Diabetes Neg Hx   . Colon polyps Neg Hx   . Rectal cancer Neg Hx   . Stomach cancer Neg Hx     Medications: Patient's Medications  New Prescriptions   No medications on file  Previous Medications   ACETAMINOPHEN (TYLENOL) 500 MG TABLET    Take  500 mg by mouth every 6 (six) hours as needed for pain. For pain   ASCORBIC ACID (VITAMIN C) 1000 MG TABLET    Take 1,000 mg by mouth daily.   BIOTIN 5000 MCG TABS    Take 1 tablet by mouth daily.   BUPROPION (WELLBUTRIN XL) 150 MG 24 HR TABLET    TAKE 1 TABLET BY MOUTH EVERY DAY   CALCIUM CARBONATE-VITAMIN D (CALCIUM + D PO)    Take 2 tablets by mouth daily.    CYANOCOBALAMIN (VITAMIN B 12 PO)    Take 1 tablet by mouth daily.    ESTRACE VAGINAL 0.1 MG/GM VAGINAL CREAM    Place 1 g vaginally 2 (two) times a week.    FUROSEMIDE (LASIX) 20 MG TABLET    Take 1 tablet (20 mg total) by mouth daily as needed for fluid or edema.   MAGNESIUM 250 MG TABS    Take 1 tablet by mouth daily.    MULTIPLE VITAMIN (MULTIVITAMIN WITH MINERALS) TABS    Take 1 tablet by mouth daily.   POTASSIUM CHLORIDE SA (K-DUR,KLOR-CON) 20 MEQ TABLET    Take 1 tablet (20 mEq total) by mouth daily as needed (only with lasix).   RABEPRAZOLE (ACIPHEX) 20 MG TABLET    TAKE 1 TABLET ONCE DAILY   FOR STOMACH *LUPIN MFR*   RIVAROXABAN (XARELTO) 20 MG TABS TABLET    TAKE 1 TABLET DAILY WITH   SUPPER   SYNTHROID 100 MCG TABLET    TAKE 1 TABLET (100 MCG TOTAL) BY MOUTH DAILY BEFORE BREAKFAST.   TRANDOLAPRIL (MAVIK) 2 MG TABLET    TAKE 1 TABLET BY MOUTH TWICE A DAY  Modified Medications   No medications on file  Discontinued Medications   No medications on file     Physical Exam:  Vitals:   01/26/18 1515  BP: 120/72  Pulse: 65  Temp: 98.1 F (36.7 C)  TempSrc: Oral  SpO2: 99%  Weight: 123 lb 6.4 oz (56 kg)  Height: 5\' 8"  (1.727 m)   Body mass index is 18.76 kg/m.  Physical Exam  Constitutional: She appears  well-developed and well-nourished.  Cardiovascular: Normal rate, regular rhythm, normal heart sounds and intact distal pulses.  Pulmonary/Chest: Effort normal and breath sounds normal.  Musculoskeletal:       Left ankle: She exhibits swelling and ecchymosis. She exhibits normal pulse. Tenderness. Lateral malleolus tenderness found.  Skin: Skin is warm and dry. Abrasion noted. There is erythema.  2 small abrasions noted to left ankle, 0.5 x 0.5 cm with red base and 0.5 x0.25 with red base. No drainage noted. Slight erythema surrounding abrasion, no infection noted.     Labs reviewed: Basic Metabolic Panel: Recent Labs    04/28/17 1642 07/21/17 1550 12/08/17 1609  NA  --  134*  --   K  --  4.6  --   CL  --  103  --   CO2  --  28  --   GLUCOSE  --  87  --   BUN 19 34*  --   CREATININE 0.73 0.79  --   CALCIUM  --  9.1  --   TSH  --  0.31* 0.75   Liver Function Tests: Recent Labs    07/21/17 1550  AST 21  ALT 13  BILITOT 0.3  PROT 6.2   No results for input(s): LIPASE, AMYLASE in the last 8760 hours. No results for input(s): AMMONIA in the last 8760 hours. CBC: Recent Labs  07/21/17 1550 12/08/17 1609  WBC 6.5 7.2  NEUTROABS 3,855 4,370  HGB 11.4* 11.5*  HCT 33.8* 33.8*  MCV 86.0 89.7  PLT 196 189   Lipid Panel: Recent Labs    07/21/17 1550  CHOL 143  HDL 65  LDLCALC 58  TRIG 117  CHOLHDL 2.2   TSH: Recent Labs    07/21/17 1550 12/08/17 1609  TSH 0.31* 0.75   A1C: No results found for: HGBA1C   Assessment/Plan .1. Acute left ankle pain - DG Ankle Complete Left; Future rule out fracture  2. Abrasion of left ankle, initial encounter - to stop current treatment with bactin spray, alcohol and iodine with antibacterial ointment.  To use xeroform gauze, change every 3 days or sooner if needed. To notify for drainage, heat, pain or odor.  Next appt: follow up in 10 days on abrasions.  Carlos American. [place], [facility] [phone]

## 2018-01-27 ENCOUNTER — Other Ambulatory Visit: Payer: Self-pay | Admitting: Nurse Practitioner

## 2018-01-27 DIAGNOSIS — E2839 Other primary ovarian failure: Secondary | ICD-10-CM

## 2018-01-27 DIAGNOSIS — M25572 Pain in left ankle and joints of left foot: Secondary | ICD-10-CM

## 2018-01-30 ENCOUNTER — Encounter (INDEPENDENT_AMBULATORY_CARE_PROVIDER_SITE_OTHER): Payer: Self-pay | Admitting: Physician Assistant

## 2018-01-30 ENCOUNTER — Ambulatory Visit (INDEPENDENT_AMBULATORY_CARE_PROVIDER_SITE_OTHER): Payer: Medicare Other | Admitting: Physician Assistant

## 2018-01-30 VITALS — Ht 68.0 in | Wt 123.0 lb

## 2018-01-30 DIAGNOSIS — L97322 Non-pressure chronic ulcer of left ankle with fat layer exposed: Secondary | ICD-10-CM

## 2018-01-31 ENCOUNTER — Telehealth (INDEPENDENT_AMBULATORY_CARE_PROVIDER_SITE_OTHER): Payer: Self-pay | Admitting: Physician Assistant

## 2018-01-31 ENCOUNTER — Telehealth: Payer: Self-pay | Admitting: Cardiology

## 2018-01-31 NOTE — Telephone Encounter (Signed)
  Ortho doc wants to put patient in compression stocking that generates a micro current on her ankle. Patient would like to know if its okay for her to wear it.

## 2018-01-31 NOTE — Telephone Encounter (Signed)
Defer to Dr [name] for review and will contact patient

## 2018-01-31 NOTE — Telephone Encounter (Signed)
Patient called stating that she does not feel comfortable purchasing the compression socks that generate the micro current until she speaks with her cardiologist.  She has a call into him but is not sure when she will hear back from him.  She wanted to know if she needed to keep her appointment.  CB#[phone].  Thank you.

## 2018-01-31 NOTE — Telephone Encounter (Signed)
Should check with EP to make sure this wouldn't interfere with ICD  [name] Puffenbarger [name] MD, [facility]

## 2018-01-31 NOTE — Telephone Encounter (Signed)
Spoke to patient , she is aware we  are awaiting  for Dr Lovena Le to respond and we will contact her with answer

## 2018-02-01 ENCOUNTER — Encounter (INDEPENDENT_AMBULATORY_CARE_PROVIDER_SITE_OTHER): Payer: Self-pay | Admitting: Physician Assistant

## 2018-02-01 NOTE — Telephone Encounter (Signed)
Left detailed message advising ok per Dr. Lovena Le to use the afore mentioned compression hose.

## 2018-02-01 NOTE — Telephone Encounter (Signed)
I called patient to make her aware that she could keep her appt with Dr Sharol Given but she suggested that she wanted to cancel and to be seen by her cardiologist first and I also spoke to her about her compression socks and the PA advised that she should continue using her previous drsg.

## 2018-02-01 NOTE — Progress Notes (Signed)
Office Visit Note   Patient: [name]           Date of Birth: [birth year]           MRN: [id number] Visit Date: 01/30/2018              Requested by: Gayland Curry, DO [place], [place] [address] PCP: Gayland Curry, DO  Chief Complaint  Patient presents with  . Right Ankle - Pain      HPI: The patient is a 84 year old female who presents with a ulcer over her left ankle.  She reports that on 12/31/2017 she was taking some recycling out to the recycle bin when she accidentally lost her balance and fell and sustained a small skin the area over her ankle.  She was concerned that it was infected as it was not healing well and she did go and see her primary care physician.  Radiographs were obtained by the primary care and there was concern that she had an acute fracture although the patient reports that she never had pain with weightbearing.  She was started on some Xeroform dressing changes to the residual wound over the left lateral ankle and reports that she feels like the wound is healing but presents today due to concerns that she may have a fracture.  Again the patient reports no pain with weightbearing.  She has not been on antibiotics.  Xeroform gauze does appear to be helping with her wound but it is healing slowly.  The patient does have a history of vascular disease.  Assessment & Plan: Visit Diagnoses:  1. Ankle ulcer, left, with fat layer exposed ([place])     Plan: Counseled the patient that her radiographs are reviewed and do not show any evidence for acute fracture.  She likely had a previous history of sprains to that ankle with small avulsions but no evidence for acute fracture.  We are going to have her utilize a vive silver compression stocking directly over the residual wound.  She was instructed to wash this daily with antibacterial soap and dry it and on her sock and wear this except for showering even at night.  The patient will follow-up in 1 week for  recheck or sooner should she have difficulties in the interim.  She can walk in a regular shoe and does not require the postoperative shoe or Ace bandages any longer.  And will follow-up in 1 week.  Follow-Up Instructions: Return in about 1 week (around 02/06/2018).   Ortho Exam  Patient is alert, oriented, no adenopathy, well-dressed, normal affect, normal respiratory effort. The patient is an elderly thin frail appearing female who is ambulatory without antalgic component to her left ankle.  Her left ankle is nontender to palpation.  She has good ankle range of motion.  She has some mild tenderness over the ulcer.  The ulcer is approximately 3 x 4 mm and has a good pink granulation base.  There are no signs of cellulitis.  She has good pedal pulses.  Imaging: No results found. No images are attached to the encounter.  Labs: Lab Results  Component Value Date   REPTSTATUS 01/01/2013 FINAL 01/01/2013   REPTSTATUS 01/03/2013 FINAL 01/01/2013   GRAMSTAIN No WBC Seen 02/18/2016   GRAMSTAIN Rare Squamous Epithelial Cells Present 02/18/2016   GRAMSTAIN  02/18/2016    Rare GRAM POSITIVE COCCI IN PAIRS This specimen is acceptable for Sputum Culture   CULT  01/01/2013  NORMAL OROPHARYNGEAL FLORA Performed at [facility] Normal Oropharyngeal Flora 02/18/2016     Lab Results  Component Value Date   ALBUMIN 3.9 07/07/2016   ALBUMIN 4.3 08/18/2015   ALBUMIN 4.3 05/16/2015   PREALBUMIN 9.9 (L) 01/08/2013   PREALBUMIN 5.1 (L) 01/01/2013   PREALBUMIN 7.6 (L) 12/31/2012    Body mass index is 18.7 kg/m.  Orders:  No orders of the defined types were placed in this encounter.  No orders of the defined types were placed in this encounter.    Procedures: No procedures performed  Clinical Data: No additional findings.  ROS:  All other systems negative, except as noted in the HPI. Review of Systems  Objective: Vital Signs: Ht 5\' 8"  (1.727 m)   Wt 123 lb (55.8  kg)   BMI 18.70 kg/m   Specialty Comments:  No specialty comments available.  PMFS History: Patient Active Problem List   Diagnosis Date Noted  . Coronary atherosclerosis due to calcified coronary lesion of native artery 12/09/2017  . Paroxysmal atrial fibrillation ([place]) 11/07/2015  . Lumbago 08/20/2015  . Kyphosis 08/20/2015  . Insomnia 08/20/2015  . Aortic atherosclerosis ([place]) 08/20/2015  . Abdominal bruit 08/20/2015  . Aortic ectasia ([place]) 08/20/2015  . AAA (abdominal aortic aneurysm) without rupture ([place]) 08/20/2015  . B12 deficiency 05/21/2015  . HTN (hypertension) 11/19/2014  . Cardiomyopathy, dilated, nonischemic ([place]) 07/18/2014  . Biventricular cardiac pacemaker in situ 04/24/2014  . Chronic cough 04/17/2014  . Aneurysm of iliac artery ([facility]) 04/17/2014  . Incisional hernia, without obstruction or gangrene 11/21/2013  . Frequency of urination 11/21/2013  . Malaise and fatigue 05/08/2013  . Raynaud disease 05/08/2013  . Moderate malnutrition ([place]) 12/24/2012  . Hypothyroidism   . Hair thinning   . Anxiety disorder   . Hyperlipidemia   . Sinusitis, chronic   . Chronic bronchitis ([place])   . Depression   . Restless legs syndrome (RLS)   . PVC (premature ventricular contraction) 06/08/2011  . GERD 02/03/2009  . Chronic systolic heart failure ([place]) 12/12/2008  . BARRETT'S ESOPHAGUS 05/29/2008  . Gastritis and gastroduodenitis 05/29/2008   Past Medical History:  Diagnosis Date  . Allergy    all year   . Anxiety disorder   . Arthritis   . Barrett's esophagus 04/2008  . Benign neoplasm of colon   . Blood transfusion without reported diagnosis    long ago per pt  . Cataract    removed both eyes  . CHF (congestive heart failure) ([place])   . Chronic airway obstruction, not elsewhere classified   . Complication of anesthesia    slow to wake up  . Depressive disorder, not elsewhere classified   . Diverticulitis   . Dysthymic disorder   . Erosive gastritis 08/2004    . GERD (gastroesophageal reflux disease)   . Hair thinning   . HTN (hypertension)    pt denies   . Hyperplastic polyps of stomach 06/2002  . Hypothyroidism   . ICD (implantable cardiac defibrillator) in place    BiV/ICD; s/p removal of ICD with insertion of BiV PPM 04/26/12  . Insomnia 08/20/2015  . Kyphosis 08/20/2015  . LBBB (left bundle branch block)   . Melanoma ([place])    right arm  . Mitral regurgitation   . Nonischemic cardiomyopathy ([place])    EF initially 20%; Last measurement up to 50% per echo in August of 2012  . Osteopenia   . Other and unspecified hyperlipidemia   . Other and  unspecified hyperlipidemia   . Pacemaker 04/26/2012  . Pain in joint, lower leg   . Palpitations   . Restless legs syndrome (RLS)   . Synovial cyst of popliteal space   . Unspecified chronic bronchitis ([place])   . Unspecified nasal polyp   . Unspecified sinusitis (chronic)     Family History  Problem Relation Age of Onset  . Stroke Father   . Heart disease Other        maternal side  . Colon cancer Paternal Uncle   . Breast cancer Maternal Aunt   . Colon cancer Cousin   . Diabetes Neg Hx   . Colon polyps Neg Hx   . Rectal cancer Neg Hx   . Stomach cancer Neg Hx     Past Surgical History:  Procedure Laterality Date  . ABDOMINAL HYSTERECTOMY  1076   endometriosis  . APPENDECTOMY    . BI-VENTRICULAR PACEMAKER INSERTION N/A 04/26/2012   Procedure: BI-VENTRICULAR PACEMAKER INSERTION (CRT-P);  Surgeon: Evans Lance, MD;  Location: [facility] C[facility] LAB;  Service: Cardiovascular;  Laterality: N/A;  . COLONOSCOPY    . defibrillator insertion     s/p removal of previously implanted BiV ICD and insertion of a new BiV pacemaker on 04/26/12  . excise mole of lip  2001   Dr. Larena Sox  . excision of wen  1984   Dr. Parks Ranger  . FEMORAL HERNIA REPAIR  12/25/2012   Dr Dalbert Batman  . FEMORAL HERNIA REPAIR  01/04/2013   Recurrent - Dr. Lilyan Punt  . INCISIONAL HERNIA REPAIR N/A 10/01/2013   Procedure: LAPAROSCOPIC  INCISIONAL HERNIA;  Surgeon: Gayland Curry, MD;  [facility];  Service: General;  Laterality: N/A;  . INGUINAL HERNIA REPAIR Right 09/26/2012   Procedure: RIGHT INGUINAL HERNIA REPAIR WITH MESH;  Surgeon: Odis Hollingshead, MD;  Location: [place];  Service: General;  Laterality: Right;  . INGUINAL HERNIA REPAIR Right 12/25/2012   Procedure: explor right groin, small bowel rescection, tissue repair right femoral hernia;  Surgeon: Adin Hector, MD;  [facility];  Service: General;  Laterality: Right;  . INSERTION OF MESH Right 09/26/2012   Procedure: INSERTION OF MESH;  Surgeon: Odis Hollingshead, MD;  Location: [place];  Service: General;  Laterality: Right;  . INSERTION OF MESH N/A 10/01/2013   Procedure: INSERTION OF MESH;  Surgeon: Gayland Curry, MD;  [facility];  Service: General;  Laterality: N/A;  . LAPAROSCOPY N/A 01/04/2013   Procedure: Diagnostic Laparoscopy, exploratory laparotomy with small bowel resection, closure of right femoral hernia repair;  Surgeon: Madilyn Hook, DO;  [facility];  Service: General;  Laterality: N/A;  . Left arm surgery    . POLYPECTOMY    . RIGHT BREAST LUMPECTOMY  1988   Dr. Marylene Buerger  . right knee surgery     arthroscopy: Dr. Shellia Carwin  . TONSILLECTOMY    . UPPER GASTROINTESTINAL ENDOSCOPY     Social History   Occupational History  . Occupation: Retired    Fish farm manager: RETIRED  Tobacco Use  . Smoking status: Former Smoker    Packs/day: 1.00    Years: 15.00    Pack years: 15.00    Types: Cigarettes    Last attempt to quit: 12/22/1993    Years since quitting: 24.1  . Smokeless tobacco: Never Used  . Tobacco comment: Does'nt reall year quit   Substance and Sexual Activity  . Alcohol use: No    Alcohol/week: 0.0 standard drinks  . Drug  use: No  . Sexual activity: Never

## 2018-02-03 ENCOUNTER — Other Ambulatory Visit: Payer: Self-pay | Admitting: Internal Medicine

## 2018-02-03 ENCOUNTER — Other Ambulatory Visit: Payer: Self-pay | Admitting: Cardiology

## 2018-02-06 ENCOUNTER — Ambulatory Visit (INDEPENDENT_AMBULATORY_CARE_PROVIDER_SITE_OTHER): Payer: Medicare Other | Admitting: Physician Assistant

## 2018-02-09 ENCOUNTER — Ambulatory Visit (INDEPENDENT_AMBULATORY_CARE_PROVIDER_SITE_OTHER): Payer: Medicare Other | Admitting: Physician Assistant

## 2018-02-09 ENCOUNTER — Telehealth: Payer: Self-pay | Admitting: *Deleted

## 2018-02-09 ENCOUNTER — Encounter (INDEPENDENT_AMBULATORY_CARE_PROVIDER_SITE_OTHER): Payer: Self-pay | Admitting: Physician Assistant

## 2018-02-09 ENCOUNTER — Ambulatory Visit: Payer: Medicare Other | Admitting: Nurse Practitioner

## 2018-02-09 VITALS — Ht 68.0 in | Wt 123.0 lb

## 2018-02-09 DIAGNOSIS — L97322 Non-pressure chronic ulcer of left ankle with fat layer exposed: Secondary | ICD-10-CM

## 2018-02-09 MED ORDER — AMOXICILLIN-POT CLAVULANATE 875-125 MG PO TABS: 1.0000 | ORAL_TABLET | Freq: Two times a day (BID) | ORAL | 0 refills | Status: DC

## 2018-02-09 MED ORDER — MUPIROCIN 2 % EX OINT: 1.0000 "application " | TOPICAL_OINTMENT | Freq: Every day | CUTANEOUS | 0 refills | Status: DC

## 2018-02-09 NOTE — Telephone Encounter (Signed)
Ok for her to just get this locally since it's been effective for her.

## 2018-02-09 NOTE — Telephone Encounter (Signed)
Patient called and stated that [facility] called her and stated that they are temporarily out of her  Rabeprazole. Patient stated that she has 16 tablets left and she will just buy them from the pharmacy until they get it back in if they by chance call our office wanting a substitute. She said there was no need changing it.

## 2018-02-10 ENCOUNTER — Telehealth (INDEPENDENT_AMBULATORY_CARE_PROVIDER_SITE_OTHER): Payer: Self-pay | Admitting: Orthopedic Surgery

## 2018-02-10 ENCOUNTER — Encounter (INDEPENDENT_AMBULATORY_CARE_PROVIDER_SITE_OTHER): Payer: Self-pay | Admitting: Physician Assistant

## 2018-02-10 NOTE — Progress Notes (Signed)
Office Visit Note   Patient: [name]           Date of Birth: 1932-10-09           MRN: [id number] Visit Date: 02/09/2018              Requested by: Gayland Curry, DO [place], [place] [address] PCP: Gayland Curry, DO  Chief Complaint  Patient presents with  . Left Ankle - Follow-up      HPI: The patient is a 84 yo female seen for follow up of her left ankle ulcer. She has a history of trauma/abrasion to the lateral ankle on 12/31/17 and saw [place] on 01/30/18 and it was recommended that she begin wearing a vive silver compression sock for treatment of the wound. She was hesitant to use the silver/copper sock as she was worried it might effect her pacemaker, but she has since heard from her PCP who reassured her it would be okay to use this. She comes in today with concerns that the wound is worsening and she reports some pain over the area. She is planning to pick up the vive silver socks today,but is concerned there is infection over the area now.  She reports increased redness and pain over the area for the past 2-3 days. She has been using xeroform to the area daily.  Assessment & Plan: Visit Diagnoses:  1. Ankle ulcer, left, with fat layer exposed ([place])     Plan: Will begin Augmentin 875 mg BID for the next week and Bactroban ointment to the area daily after cleaning. She can apply the Vive sock directly over the area without a dressing and wear the socks 24 hours a day except for showering. Elevate the left foot and limit ambulation as much as able over the next week.  She will follow up in 1 week.   Follow-Up Instructions: Return in about 1 week (around 02/16/2018).   Ortho Exam  Patient is alert, oriented, no adenopathy, well-dressed, normal affect, normal respiratory effort. The left ankle ulcer is slightly more erythematous and tender to palpation. Scant serous appearing drainage. Good pedal pulses.   Imaging: No results found. No images are attached  to the encounter.  Labs: Lab Results  Component Value Date   REPTSTATUS 01/01/2013 FINAL 01/01/2013   REPTSTATUS 01/03/2013 FINAL 01/01/2013   GRAMSTAIN No WBC Seen 02/18/2016   GRAMSTAIN Rare Squamous Epithelial Cells Present 02/18/2016   GRAMSTAIN  02/18/2016    Rare GRAM POSITIVE COCCI IN PAIRS This specimen is acceptable for Sputum Culture   CULT  01/01/2013    NORMAL OROPHARYNGEAL FLORA Performed at [facility] Normal Oropharyngeal Flora 02/18/2016     Lab Results  Component Value Date   ALBUMIN 3.9 07/07/2016   ALBUMIN 4.3 08/18/2015   ALBUMIN 4.3 05/16/2015   PREALBUMIN 9.9 (L) 01/08/2013   PREALBUMIN 5.1 (L) 01/01/2013   PREALBUMIN 7.6 (L) 12/31/2012    Body mass index is 18.7 kg/m.  Orders:  No orders of the defined types were placed in this encounter.  Meds ordered this encounter  Medications  . amoxicillin-clavulanate (AUGMENTIN) 875-125 MG tablet    Sig: Take 1 tablet by mouth 2 (two) times daily.    Dispense:  14 tablet    Refill:  0  . mupirocin ointment (BACTROBAN) 2 %    Sig: Apply 1 application topically daily. Apply to ankle wound daily    Dispense:  22 g  Refill:  0     Procedures: No procedures performed  Clinical Data: No additional findings.  ROS:  All other systems negative, except as noted in the HPI. Review of Systems  Objective: Vital Signs: Ht 5\' 8"  (1.727 m)   Wt 123 lb (55.8 kg)   BMI 18.70 kg/m   Specialty Comments:  No specialty comments available.  PMFS History: Patient Active Problem List   Diagnosis Date Noted  . Coronary atherosclerosis due to calcified coronary lesion of native artery 12/09/2017  . Paroxysmal atrial fibrillation ([place]) 11/07/2015  . Lumbago 08/20/2015  . Kyphosis 08/20/2015  . Insomnia 08/20/2015  . Aortic atherosclerosis ([place]) 08/20/2015  . Abdominal bruit 08/20/2015  . Aortic ectasia ([place]) 08/20/2015  . AAA (abdominal aortic aneurysm) without rupture ([place]) 08/20/2015   . B12 deficiency 05/21/2015  . HTN (hypertension) 11/19/2014  . Cardiomyopathy, dilated, nonischemic ([place]) 07/18/2014  . Biventricular cardiac pacemaker in situ 04/24/2014  . Chronic cough 04/17/2014  . Aneurysm of iliac artery (HCC) 04/17/2014  . Incisional hernia, without obstruction or gangrene 11/21/2013  . Frequency of urination 11/21/2013  . Malaise and fatigue 05/08/2013  . Raynaud disease 05/08/2013  . Moderate malnutrition ([place]) 12/24/2012  . Hypothyroidism   . Hair thinning   . Anxiety disorder   . Hyperlipidemia   . Sinusitis, chronic   . Chronic bronchitis ([place])   . Depression   . Restless legs syndrome (RLS)   . PVC (premature ventricular contraction) 06/08/2011  . GERD 02/03/2009  . Chronic systolic heart failure ([place]) 12/12/2008  . BARRETT'S ESOPHAGUS 05/29/2008  . Gastritis and gastroduodenitis 05/29/2008   Past Medical History:  Diagnosis Date  . Allergy    all year   . Anxiety disorder   . Arthritis   . Barrett's esophagus 04/2008  . Benign neoplasm of colon   . Blood transfusion without reported diagnosis    long ago per pt  . Cataract    removed both eyes  . CHF (congestive heart failure) ([place])   . Chronic airway obstruction, not elsewhere classified   . Complication of anesthesia    slow to wake up  . Depressive disorder, not elsewhere classified   . Diverticulitis   . Dysthymic disorder   . Erosive gastritis 08/2004  . GERD (gastroesophageal reflux disease)   . Hair thinning   . HTN (hypertension)    pt denies   . Hyperplastic polyps of stomach 06/2002  . Hypothyroidism   . ICD (implantable cardiac defibrillator) in place    BiV/ICD; s/p removal of ICD with insertion of BiV PPM 04/26/12  . Insomnia 08/20/2015  . Kyphosis 08/20/2015  . LBBB (left bundle branch block)   . Melanoma ([place])    right arm  . Mitral regurgitation   . Nonischemic cardiomyopathy ([place])    EF initially 20%; Last measurement up to 50% per echo in August of 2012  .  Osteopenia   . Other and unspecified hyperlipidemia   . Other and unspecified hyperlipidemia   . Pacemaker 04/26/2012  . Pain in joint, lower leg   . Palpitations   . Restless legs syndrome (RLS)   . Synovial cyst of popliteal space   . Unspecified chronic bronchitis ([place])   . Unspecified nasal polyp   . Unspecified sinusitis (chronic)     Family History  Problem Relation Age of Onset  . Stroke Father   . Heart disease Other        maternal side  . Colon cancer Paternal Uncle   .  Breast cancer Maternal Aunt   . Colon cancer Cousin   . Diabetes Neg Hx   . Colon polyps Neg Hx   . Rectal cancer Neg Hx   . Stomach cancer Neg Hx     Past Surgical History:  Procedure Laterality Date  . ABDOMINAL HYSTERECTOMY  1076   endometriosis  . APPENDECTOMY    . BI-VENTRICULAR PACEMAKER INSERTION N/A 04/26/2012   Procedure: BI-VENTRICULAR PACEMAKER INSERTION (CRT-P);  Surgeon: Evans Lance, MD;  Location: [facility] CATH LAB;  Service: Cardiovascular;  Laterality: N/A;  . COLONOSCOPY    . defibrillator insertion     s/p removal of previously implanted BiV ICD and insertion of a new BiV pacemaker on 04/26/12  . excise mole of lip  2001   Dr. Larena Sox  . excision of wen  1984   Dr. Parks Ranger  . FEMORAL HERNIA REPAIR  12/25/2012   Dr Dalbert Batman  . FEMORAL HERNIA REPAIR  01/04/2013   Recurrent - Dr. Lilyan Punt  . INCISIONAL HERNIA REPAIR N/A 10/01/2013   Procedure: LAPAROSCOPIC INCISIONAL HERNIA;  Surgeon: Gayland Curry, MD;  Location: [facility];  Service: General;  Laterality: N/A;  . INGUINAL HERNIA REPAIR Right 09/26/2012   Procedure: RIGHT INGUINAL HERNIA REPAIR WITH MESH;  Surgeon: Odis Hollingshead, MD;  Location: [facility];  Service: General;  Laterality: Right;  . INGUINAL HERNIA REPAIR Right 12/25/2012   Procedure: explor right groin, small bowel rescection, tissue repair right femoral hernia;  Surgeon: Adin Hector, MD;  [facility];  Service: General;  Laterality: Right;  . INSERTION OF MESH  Right 09/26/2012   Procedure: INSERTION OF MESH;  Surgeon: Odis Hollingshead, MD;  Location: [facility];  Service: General;  Laterality: Right;  . INSERTION OF MESH N/A 10/01/2013   Procedure: INSERTION OF MESH;  Surgeon: Gayland Curry, MD;  [facility];  Service: General;  Laterality: N/A;  . LAPAROSCOPY N/A 01/04/2013   Procedure: Diagnostic Laparoscopy, exploratory laparotomy with small bowel resection, closure of right femoral hernia repair;  Surgeon: Madilyn Hook, DO;  [facility];  Service: General;  Laterality: N/A;  . Left arm surgery    . POLYPECTOMY    . RIGHT BREAST LUMPECTOMY  1988   Dr. Marylene Buerger  . right knee surgery     arthroscopy: Dr. Shellia Carwin  . TONSILLECTOMY    . UPPER GASTROINTESTINAL ENDOSCOPY     Social History   Occupational History  . Occupation: Retired    Fish farm manager: RETIRED  Tobacco Use  . Smoking status: Former Smoker    Packs/day: 1.00    Years: 15.00    Pack years: 15.00    Types: Cigarettes    Last attempt to quit: 12/22/1993    Years since quitting: 24.1  . Smokeless tobacco: Never Used  . Tobacco comment: Does'nt reall year quit   Substance and Sexual Activity  . Alcohol use: No    Alcohol/week: 0.0 standard drinks  . Drug use: No  . Sexual activity: Never

## 2018-02-10 NOTE — Telephone Encounter (Signed)
Patient called again in regards to the compression sock she previously called about.  Thank you.

## 2018-02-10 NOTE — Telephone Encounter (Signed)
Spoke with [facility] and they are aware she will get RX locally and they will put this rx on hold.

## 2018-02-10 NOTE — Telephone Encounter (Signed)
Returned call to patient left message to call back concerning needing to reschedule her appointment

## 2018-02-13 NOTE — Telephone Encounter (Signed)
I called patient and she is coming in Fri to see Shawn.

## 2018-02-16 ENCOUNTER — Ambulatory Visit (INDEPENDENT_AMBULATORY_CARE_PROVIDER_SITE_OTHER): Payer: Medicare Other | Admitting: Physician Assistant

## 2018-02-17 ENCOUNTER — Ambulatory Visit (INDEPENDENT_AMBULATORY_CARE_PROVIDER_SITE_OTHER): Payer: Medicare Other | Admitting: Physician Assistant

## 2018-02-22 ENCOUNTER — Ambulatory Visit (INDEPENDENT_AMBULATORY_CARE_PROVIDER_SITE_OTHER): Payer: Medicare Other | Admitting: Orthopedic Surgery

## 2018-02-22 ENCOUNTER — Encounter (INDEPENDENT_AMBULATORY_CARE_PROVIDER_SITE_OTHER): Payer: Self-pay | Admitting: Orthopedic Surgery

## 2018-02-22 VITALS — Ht 68.0 in | Wt 123.0 lb

## 2018-02-22 DIAGNOSIS — L97322 Non-pressure chronic ulcer of left ankle with fat layer exposed: Secondary | ICD-10-CM | POA: Diagnosis not present

## 2018-02-22 DIAGNOSIS — I2510 Atherosclerotic heart disease of native coronary artery without angina pectoris: Secondary | ICD-10-CM | POA: Diagnosis not present

## 2018-02-22 DIAGNOSIS — I2584 Coronary atherosclerosis due to calcified coronary lesion: Secondary | ICD-10-CM

## 2018-02-25 ENCOUNTER — Encounter (INDEPENDENT_AMBULATORY_CARE_PROVIDER_SITE_OTHER): Payer: Self-pay | Admitting: Orthopedic Surgery

## 2018-02-25 NOTE — Progress Notes (Signed)
Office Visit Note   Patient: [name]           Date of Birth: Nov 29, 1932           MRN: [id number] Visit Date: 02/22/2018              Requested by: Gayland Curry, DO [place], [place] [address] PCP: Gayland Curry, DO  Chief Complaint  Patient presents with  . Left Ankle - Follow-up      HPI: Patient is an 84 year old woman with a venous stasis ulcer left lateral malleolus.  She has been wearing the Vive compression stocking.  Patient states she still has a small ulcer.  She has completed her antibiotics.  Assessment & Plan: Visit Diagnoses:  1. Ankle ulcer, left, with fat layer exposed ([place])     Plan: She will continue to wear the medical compression stocking around-the-clock change as needed.  Follow-Up Instructions: Return in about 4 weeks (around 03/22/2018).   Ortho Exam  Patient is alert, oriented, no adenopathy, well-dressed, normal affect, normal respiratory effort. Examination patient swelling of the left lower extremity has decreased she still has some venous swelling she does have brawny skin color changes from the chronic venous insufficiency.  The ulcer has improved nicely and is now 3 mm in diameter 0.1 mm deep with healthy granulation tissue at the base there is no redness no cellulitis no signs of infection.  Imaging: No results found. No images are attached to the encounter.  Labs: Lab Results  Component Value Date   REPTSTATUS 01/01/2013 FINAL 01/01/2013   REPTSTATUS 01/03/2013 FINAL 01/01/2013   GRAMSTAIN No WBC Seen 02/18/2016   GRAMSTAIN Rare Squamous Epithelial Cells Present 02/18/2016   GRAMSTAIN  02/18/2016    Rare GRAM POSITIVE COCCI IN PAIRS This specimen is acceptable for Sputum Culture   CULT  01/01/2013    NORMAL OROPHARYNGEAL FLORA Performed at [facility] Normal Oropharyngeal [place]ora 02/18/2016     Lab Results  Component Value Date   ALBUMIN 3.9 07/07/2016   ALBUMIN 4.3 08/18/2015   ALBUMIN  4.3 05/16/2015   PREALBUMIN 9.9 (L) 01/08/2013   PREALBUMIN 5.1 (L) 01/01/2013   PREALBUMIN 7.6 (L) 12/31/2012    Body mass index is 18.7 kg/m.  Orders:  No orders of the defined types were placed in this encounter.  No orders of the defined types were placed in this encounter.    Procedures: No procedures performed  Clinical Data: No additional findings.  ROS:  All other systems negative, except as noted in the HPI. Review of Systems  Objective: Vital Signs: Ht 5\' 8"  (1.727 m)   Wt 123 lb (55.8 kg)   BMI 18.70 kg/m   Specialty Comments:  No specialty comments available.  PMFS History: Patient Active Problem List   Diagnosis Date Noted  . Coronary atherosclerosis due to calcified coronary lesion of native artery 12/09/2017  . Paroxysmal atrial fibrillation ([place]) 11/07/2015  . Lumbago 08/20/2015  . Kyphosis 08/20/2015  . Insomnia 08/20/2015  . Aortic atherosclerosis ([place]) 08/20/2015  . Abdominal bruit 08/20/2015  . Aortic ectasia ([place]) 08/20/2015  . AAA (abdominal aortic aneurysm) without rupture ([place]) 08/20/2015  . B12 deficiency 05/21/2015  . HTN (hypertension) 11/19/2014  . Cardiomyopathy, dilated, nonischemic ([place]) 07/18/2014  . Biventricular cardiac pacemaker in situ 04/24/2014  . Chronic cough 04/17/2014  . Aneurysm of iliac artery (HCC) 04/17/2014  . Incisional hernia, without obstruction or gangrene 11/21/2013  . Frequency of  urination 11/21/2013  . Malaise and fatigue 05/08/2013  . Raynaud disease 05/08/2013  . Moderate malnutrition ([place]) 12/24/2012  . Hypothyroidism   . Hair thinning   . Anxiety disorder   . Hyperlipidemia   . Sinusitis, chronic   . Chronic bronchitis ([place])   . Depression   . Restless legs syndrome (RLS)   . PVC (premature ventricular contraction) 06/08/2011  . GERD 02/03/2009  . Chronic systolic heart failure ([place]) 12/12/2008  . BARRETT'S ESOPHAGUS 05/29/2008  . Gastritis and gastroduodenitis 05/29/2008   Past Medical  History:  Diagnosis Date  . Allergy    all year   . Anxiety disorder   . Arthritis   . Barrett's esophagus 04/2008  . Benign neoplasm of colon   . Blood transfusion without reported diagnosis    long ago per pt  . Cataract    removed both eyes  . CHF (congestive heart failure) ([place])   . Chronic airway obstruction, not elsewhere classified   . Complication of anesthesia    slow to wake up  . Depressive disorder, not elsewhere classified   . Diverticulitis   . Dysthymic disorder   . Erosive gastritis 08/2004  . GERD (gastroesophageal reflux disease)   . Hair thinning   . HTN (hypertension)    pt denies   . Hyperplastic polyps of stomach 06/2002  . Hypothyroidism   . ICD (implantable cardiac defibrillator) in place    BiV/ICD; s/p removal of ICD with insertion of BiV PPM 04/26/12  . Insomnia 08/20/2015  . Kyphosis 08/20/2015  . LBBB (left bundle branch block)   . Melanoma ([place])    right arm  . Mitral regurgitation   . Nonischemic cardiomyopathy ([place])    EF initially 20%; Last measurement up to 50% per echo in August of 2012  . Osteopenia   . Other and unspecified hyperlipidemia   . Other and unspecified hyperlipidemia   . Pacemaker 04/26/2012  . Pain in joint, lower leg   . Palpitations   . Restless legs syndrome (RLS)   . Synovial cyst of popliteal space   . Unspecified chronic bronchitis ([place])   . Unspecified nasal polyp   . Unspecified sinusitis (chronic)     Family History  Problem Relation Age of Onset  . Stroke Father   . Heart disease Other        maternal side  . Colon cancer Paternal Uncle   . Breast cancer Maternal Aunt   . Colon cancer Cousin   . Diabetes Neg Hx   . Colon polyps Neg Hx   . Rectal cancer Neg Hx   . Stomach cancer Neg Hx     Past Surgical History:  Procedure Laterality Date  . ABDOMINAL HYSTERECTOMY  1076   endometriosis  . APPENDECTOMY    . BI-VENTRICULAR PACEMAKER INSERTION N/A 04/26/2012   Procedure: BI-VENTRICULAR PACEMAKER  INSERTION (CRT-P);  Surgeon: Evans Lance, MD;  Location[facility];  Service: Cardiovascular;  Laterality: N/A;  . COLONOSCOPY    . defibrillator insertion     s/p removal of previously implanted BiV ICD and insertion of a new BiV pacemaker on 04/26/12  . excise mole of lip  2001   Dr. Larena Sox  . excision of wen  1984   Dr. Parks Ranger  . FEMORAL HERNIA REPAIR  12/25/2012   Dr Dalbert Batman  . FEMORAL HERNIA REPAIR  01/04/2013   Recurrent - Dr. Lilyan Punt  . INCISIONAL HERNIA REPAIR N/A 10/01/2013   Procedure: LAPAROSCOPIC INCISIONAL HERNIA;  Surgeon: Gayland Curry, MD;  [facility];  Service: General;  Laterality: N/A;  . INGUINAL HERNIA REPAIR Right 09/26/2012   Procedure: RIGHT INGUINAL HERNIA REPAIR WITH MESH;  Surgeon: Odis Hollingshead, MD;  Location: [facility];  Service: General;  Laterality: Right;  . INGUINAL HERNIA REPAIR Right 12/25/2012   Procedure: explor right groin, small bowel rescection, tissue repair right femoral hernia;  Surgeon: Adin Hector, MD;  [facility];  Service: General;  Laterality: Right;  . INSERTION OF MESH Right 09/26/2012   Procedure: INSERTION OF MESH;  Surgeon: Odis Hollingshead, MD;  Location: [place];  Service: General;  Laterality: Right;  . INSERTION OF MESH N/A 10/01/2013   Procedure: INSERTION OF MESH;  Surgeon: Gayland Curry, MD;  [facility];  Service: General;  Laterality: N/A;  . LAPAROSCOPY N/A 01/04/2013   Procedure: Diagnostic Laparoscopy, exploratory laparotomy with small bowel resection, closure of right femoral hernia repair;  Surgeon: Madilyn Hook, DO;  [facility];  Service: General;  Laterality: N/A;  . Left arm surgery    . POLYPECTOMY    . RIGHT BREAST LUMPECTOMY  1988   Dr. Marylene Buerger  . right knee surgery     arthroscopy: Dr. Shellia Carwin  . TONSILLECTOMY    . UPPER GASTROINTESTINAL ENDOSCOPY     Social History   Occupational History  . Occupation: Retired    Fish farm manager: RETIRED  Tobacco Use  . Smoking status: Former  Smoker    Packs/day: 1.00    Years: 15.00    Pack years: 15.00    Types: Cigarettes    Last attempt to quit: 12/22/1993    Years since quitting: 24.1  . Smokeless tobacco: Never Used  . Tobacco comment: Does'nt reall year quit   Substance and Sexual Activity  . Alcohol use: No    Alcohol/week: 0.0 standard drinks  . Drug use: No  . Sexual activity: Never

## 2018-03-09 ENCOUNTER — Telehealth: Payer: Self-pay | Admitting: *Deleted

## 2018-03-09 MED ORDER — RABEPRAZOLE SODIUM 20 MG PO TBEC: DELAYED_RELEASE_TABLET | ORAL | 1 refills | Status: DC

## 2018-03-09 NOTE — Telephone Encounter (Signed)
Patient called and stated that the Rabeprazole (Lupin MFR) is on back order but the mail order pharmacy has another [place] in stock. Patient is requesting a Rx to be faxed to pharmacy without the LUPIN MFR.   Pended Rx and sent to Dr. Mariea Clonts for approval.

## 2018-03-10 DIAGNOSIS — N3020 Other chronic cystitis without hematuria: Secondary | ICD-10-CM | POA: Diagnosis not present

## 2018-03-10 DIAGNOSIS — R350 Frequency of micturition: Secondary | ICD-10-CM | POA: Diagnosis not present

## 2018-03-10 DIAGNOSIS — R3914 Feeling of incomplete bladder emptying: Secondary | ICD-10-CM | POA: Diagnosis not present

## 2018-03-21 ENCOUNTER — Encounter (INDEPENDENT_AMBULATORY_CARE_PROVIDER_SITE_OTHER): Payer: Self-pay | Admitting: Orthopedic Surgery

## 2018-03-21 ENCOUNTER — Ambulatory Visit (INDEPENDENT_AMBULATORY_CARE_PROVIDER_SITE_OTHER): Payer: Medicare Other | Admitting: Physician Assistant

## 2018-03-21 VITALS — Ht 68.0 in | Wt 123.0 lb

## 2018-03-21 DIAGNOSIS — L97322 Non-pressure chronic ulcer of left ankle with fat layer exposed: Secondary | ICD-10-CM | POA: Diagnosis not present

## 2018-03-21 MED ORDER — AMOXICILLIN-POT CLAVULANATE 875-125 MG PO TABS: 1.0000 | ORAL_TABLET | Freq: Two times a day (BID) | ORAL | 0 refills | Status: DC

## 2018-03-21 NOTE — Progress Notes (Signed)
Office Visit Note   Patient: [name]           Date of Birth: 22-Dec-1932           MRN: [id number] Visit Date: 03/21/2018              Requested by: Gayland Curry, DO [place], [place] [address] PCP: Gayland Curry, DO  Chief Complaint  Patient presents with  . Left Ankle - Follow-up      HPI: The patient is a 85 yo woman who is seen for follow up of her left ankle lateral malleolar ulcer. She has a history of trauma/abrasion to the lateral ankle on 12/31/17 and saw [place] on 01/30/18 and it was recommended that she begin wearing a vive silver compression sock for treatment of the wound. She has been wearing the medium compression sock, but reports she just cannot wear it due to tenderness over the wound and the skin getting pulled with the sock don and doffing. She thought about getting a larger sock, but has not obtained one. She is concerned about how long the wound is taking to heal and is concerned that it may be infected as it is tender again.   Assessment & Plan: Visit Diagnoses:  1. Ankle ulcer, left, with fat layer exposed ([place])     Plan: Augmentin 875 mg BID x 7 days and silver collagen to the wound bed every other day and cover with bandaid for the next week. Okay to stay out of sock for the next week.  Follow up in 1 week.   Follow-Up Instructions: Return in about 1 week (around 03/28/2018).   Ortho Exam  Patient is alert, oriented, no adenopathy, well-dressed, normal affect, normal respiratory effort. The left lateral ankle wound is very tender to palpation and has some mild edema and erythema localized to the wound area.  The wound is overall smaller and more shallow approximately 1 x 1.5 x 0.1 cm.  She has good pedal pulses.  Imaging: No results found.   Labs: Lab Results  Component Value Date   REPTSTATUS 01/01/2013 FINAL 01/01/2013   REPTSTATUS 01/03/2013 FINAL 01/01/2013   GRAMSTAIN No WBC Seen 02/18/2016   GRAMSTAIN Rare Squamous  Epithelial Cells Present 02/18/2016   GRAMSTAIN  02/18/2016    Rare GRAM POSITIVE COCCI IN PAIRS This specimen is acceptable for Sputum Culture   CULT  01/01/2013    NORMAL OROPHARYNGEAL FLORA Performed at [facility] Normal Oropharyngeal [place] 02/18/2016     Lab Results  Component Value Date   ALBUMIN 3.9 07/07/2016   ALBUMIN 4.3 08/18/2015   ALBUMIN 4.3 05/16/2015   PREALBUMIN 9.9 (L) 01/08/2013   PREALBUMIN 5.1 (L) 01/01/2013   PREALBUMIN 7.6 (L) 12/31/2012    Body mass index is 18.7 kg/m.  Orders:  No orders of the defined types were placed in this encounter.  Meds ordered this encounter  Medications  . amoxicillin-clavulanate (AUGMENTIN) 875-125 MG tablet    Sig: Take 1 tablet by mouth 2 (two) times daily.    Dispense:  14 tablet    Refill:  0     Procedures: No procedures performed  Clinical Data: No additional findings.  ROS:  All other systems negative, except as noted in the HPI. Review of Systems  Objective: Vital Signs: Ht 5\' 8"  (1.727 m)   Wt 123 lb (55.8 kg)   BMI 18.70 kg/m   Specialty Comments:  No specialty comments  available.  PMFS History: Patient Active Problem List   Diagnosis Date Noted  . Coronary atherosclerosis due to calcified coronary lesion of native artery 12/09/2017  . Paroxysmal atrial fibrillation ([place]) 11/07/2015  . Lumbago 08/20/2015  . Kyphosis 08/20/2015  . Insomnia 08/20/2015  . Aortic atherosclerosis ([place]) 08/20/2015  . Abdominal bruit 08/20/2015  . Aortic ectasia ([place]) 08/20/2015  . AAA (abdominal aortic aneurysm) without rupture ([place]) 08/20/2015  . B12 deficiency 05/21/2015  . HTN (hypertension) 11/19/2014  . Cardiomyopathy, dilated, nonischemic ([place]) 07/18/2014  . Biventricular cardiac pacemaker in situ 04/24/2014  . Chronic cough 04/17/2014  . Aneurysm of iliac artery (HCC) 04/17/2014  . Incisional hernia, without obstruction or gangrene 11/21/2013  . Frequency of urination 11/21/2013    . Malaise and fatigue 05/08/2013  . Raynaud disease 05/08/2013  . Moderate malnutrition ([place]) 12/24/2012  . Hypothyroidism   . Hair thinning   . Anxiety disorder   . Hyperlipidemia   . Sinusitis, chronic   . Chronic bronchitis ([place])   . Depression   . Restless legs syndrome (RLS)   . PVC (premature ventricular contraction) 06/08/2011  . GERD 02/03/2009  . Chronic systolic heart failure ([place]) 12/12/2008  . BARRETT'S ESOPHAGUS 05/29/2008  . Gastritis and gastroduodenitis 05/29/2008   Past Medical History:  Diagnosis Date  . Allergy    all year   . Anxiety disorder   . Arthritis   . Barrett's esophagus 04/2008  . Benign neoplasm of colon   . Blood transfusion without reported diagnosis    long ago per pt  . Cataract    removed both eyes  . CHF (congestive heart failure) ([place])   . Chronic airway obstruction, not elsewhere classified   . Complication of anesthesia    slow to wake up  . Depressive disorder, not elsewhere classified   . Diverticulitis   . Dysthymic disorder   . Erosive gastritis 08/2004  . GERD (gastroesophageal reflux disease)   . Hair thinning   . HTN (hypertension)    pt denies   . Hyperplastic polyps of stomach 06/2002  . Hypothyroidism   . ICD (implantable cardiac defibrillator) in place    BiV/ICD; s/p removal of ICD with insertion of BiV PPM 04/26/12  . Insomnia 08/20/2015  . Kyphosis 08/20/2015  . LBBB (left bundle branch block)   . Melanoma ([place])    right arm  . Mitral regurgitation   . Nonischemic cardiomyopathy ([place])    EF initially 20%; Last measurement up to 50% per echo in August of 2012  . Osteopenia   . Other and unspecified hyperlipidemia   . Other and unspecified hyperlipidemia   . Pacemaker 04/26/2012  . Pain in joint, lower leg   . Palpitations   . Restless legs syndrome (RLS)   . Synovial cyst of popliteal space   . Unspecified chronic bronchitis ([place])   . Unspecified nasal polyp   . Unspecified sinusitis (chronic)     Family  History  Problem Relation Age of Onset  . Stroke Father   . Heart disease Other        maternal side  . Colon cancer Paternal Uncle   . Breast cancer Maternal Aunt   . Colon cancer Cousin   . Diabetes Neg Hx   . Colon polyps Neg Hx   . Rectal cancer Neg Hx   . Stomach cancer Neg Hx     Past Surgical History:  Procedure Laterality Date  . ABDOMINAL HYSTERECTOMY  1076   endometriosis  .  APPENDECTOMY    . BI-VENTRICULAR PACEMAKER INSERTION N/A 04/26/2012   Procedure: BI-VENTRICULAR PACEMAKER INSERTION (CRT-P);  Surgeon: Evans Lance, MD;  Location: [facility] CATH LAB;  Service: Cardiovascular;  Laterality: N/A;  . COLONOSCOPY    . defibrillator insertion     s/p removal of previously implanted BiV ICD and insertion of a new BiV pacemaker on 04/26/12  . excise mole of lip  2001   Dr. Larena Sox  . excision of wen  1984   Dr. Parks Ranger  . FEMORAL HERNIA REPAIR  12/25/2012   Dr Dalbert Batman  . FEMORAL HERNIA REPAIR  01/04/2013   Recurrent - Dr. Lilyan Punt  . INCISIONAL HERNIA REPAIR N/A 10/01/2013   Procedure: LAPAROSCOPIC INCISIONAL HERNIA;  Surgeon: Gayland Curry, MD;  Location: [facility];  Service: General;  Laterality: N/A;  . INGUINAL HERNIA REPAIR Right 09/26/2012   Procedure: RIGHT INGUINAL HERNIA REPAIR WITH MESH;  Surgeon: Odis Hollingshead, MD;  Location: [facility];  Service: General;  Laterality: Right;  . INGUINAL HERNIA REPAIR Right 12/25/2012   Procedure: explor right groin, small bowel rescection, tissue repair right femoral hernia;  Surgeon: Adin Hector, MD;  Location[facility];  Service: General;  Laterality: Right;  . INSERTION OF MESH Right 09/26/2012   Procedure: INSERTION OF MESH;  Surgeon: Odis Hollingshead, MD;  Location: [facility];  Service: General;  Laterality: Right;  . INSERTION OF MESH N/A 10/01/2013   Procedure: INSERTION OF MESH;  Surgeon: Gayland Curry, MD;  [facility];  Service: General;  Laterality: N/A;  . LAPAROSCOPY N/A 01/04/2013   Procedure: Diagnostic Laparoscopy,  exploratory laparotomy with small bowel resection, closure of right femoral hernia repair;  Surgeon: Madilyn Hook, DO;  [facility];  Service: General;  Laterality: N/A;  . Left arm surgery    . POLYPECTOMY    . RIGHT BREAST LUMPECTOMY  1988   Dr. Marylene Buerger  . right knee surgery     arthroscopy: Dr. Shellia Carwin  . TONSILLECTOMY    . UPPER GASTROINTESTINAL ENDOSCOPY     Social History   Occupational History  . Occupation: Retired    Fish farm manager: RETIRED  Tobacco Use  . Smoking status: Former Smoker    Packs/day: 1.00    Years: 15.00    Pack years: 15.00    Types: Cigarettes    Last attempt to quit: 12/22/1993    Years since quitting: 24.2  . Smokeless tobacco: Never Used  . Tobacco comment: Does'nt reall year quit   Substance and Sexual Activity  . Alcohol use: No    Alcohol/week: 0.0 standard drinks  . Drug use: No  . Sexual activity: Never

## 2018-03-22 ENCOUNTER — Ambulatory Visit (INDEPENDENT_AMBULATORY_CARE_PROVIDER_SITE_OTHER): Payer: Medicare Other | Admitting: Orthopedic Surgery

## 2018-03-23 DIAGNOSIS — Z85820 Personal history of malignant melanoma of skin: Secondary | ICD-10-CM | POA: Diagnosis not present

## 2018-03-23 DIAGNOSIS — L98499 Non-pressure chronic ulcer of skin of other sites with unspecified severity: Secondary | ICD-10-CM | POA: Diagnosis not present

## 2018-03-23 DIAGNOSIS — L720 Epidermal cyst: Secondary | ICD-10-CM | POA: Diagnosis not present

## 2018-03-28 ENCOUNTER — Ambulatory Visit (INDEPENDENT_AMBULATORY_CARE_PROVIDER_SITE_OTHER): Payer: Medicare Other | Admitting: Physician Assistant

## 2018-04-03 DIAGNOSIS — N3020 Other chronic cystitis without hematuria: Secondary | ICD-10-CM | POA: Diagnosis not present

## 2018-04-20 ENCOUNTER — Ambulatory Visit (INDEPENDENT_AMBULATORY_CARE_PROVIDER_SITE_OTHER): Payer: Medicare Other | Admitting: Internal Medicine

## 2018-04-20 ENCOUNTER — Encounter: Payer: Self-pay | Admitting: Internal Medicine

## 2018-04-20 VITALS — BP 130/60 | HR 55 | Temp 97.7°F | Ht 68.0 in | Wt 124.0 lb

## 2018-04-20 DIAGNOSIS — E440 Moderate protein-calorie malnutrition: Secondary | ICD-10-CM | POA: Diagnosis not present

## 2018-04-20 DIAGNOSIS — E039 Hypothyroidism, unspecified: Secondary | ICD-10-CM | POA: Diagnosis not present

## 2018-04-20 DIAGNOSIS — L97322 Non-pressure chronic ulcer of left ankle with fat layer exposed: Secondary | ICD-10-CM

## 2018-04-20 DIAGNOSIS — F331 Major depressive disorder, recurrent, moderate: Secondary | ICD-10-CM

## 2018-04-20 DIAGNOSIS — K430 Incisional hernia with obstruction, without gangrene: Secondary | ICD-10-CM

## 2018-04-20 NOTE — Progress Notes (Signed)
Location:  [facility] clinic Provider:  Evee Liska L. Mariea Clonts, D.O., C.M.D.  Goals of Care:  Advanced Directives 01/26/2018  Does Patient Have a Medical Advance Directive? No  Type of Advance Directive -  Does patient want to make changes to medical advance directive? -  Copy of [place] in Chart? -  Would patient like information on creating a medical advance directive? -  Pre-existing out of facility DNR order (yellow form or pink MOST form) -   Chief Complaint  Patient presents with  . Medical Management of Chronic Issues    39mth follow-up    HPI: Patient is a 85 y.o. female seen today for medical management of chronic diseases.    She has been back and forth to the doctor  [name] at the recycle bin--was lcosing the drawer and lost her balance, but had some skin places on her ankle.  Elbow recovered quickly.  Saw Jessica.  Had xray and it was fxed but it may have been old.  It still has not fully healed, but is making progress finally.  Has been scared she'll lose her foot or leg.   Has been seeing orthopedics about a left ankle ulcer with fat layer exposed.    Found lump under right arm.  Saw Dr. Jarome Matin.  Some liquid came out that morning and it was a cyst.  He reassured her also that she would not lose her foot.  She has had puffy eyes and around her nose.  She was worried it was her thyroid or kidneys.  Right nostril is sore inside.  Says abx she's had for UTI two courses.    Had ensure and PB and crackers at lunch.  Is not gaining weight.  She is currently craving donuts and keeps eating them at [place].  Weighs only 124 lbs nonetheless.    BP is at goal.    Thinks her cough is due to drainage.  It seems after it accumulates.  She has another hernia--saw Dr. [name] twice and it was ok.  Last thurs or fri she noticed it got bigger.  She called his office and she was very nervous--sees him next Wednesday.  She's a nervous wreck she says and she has not been  taking her antidepressant b/c she did not want to mix them with her abx she's been on for UTI.  Also had zpak, augmentin, nitrofurantoin, plain amoxicillin.     Past Medical History:  Diagnosis Date  . Allergy    all year   . Anxiety disorder   . Arthritis   . Barrett's esophagus 04/2008  . Benign neoplasm of colon   . Blood transfusion without reported diagnosis    long ago per pt  . Cataract    removed both eyes  . CHF (congestive heart failure) ([place])   . Chronic airway obstruction, not elsewhere classified   . Complication of anesthesia    slow to wake up  . Depressive disorder, not elsewhere classified   . Diverticulitis   . Dysthymic disorder   . Erosive gastritis 08/2004  . GERD (gastroesophageal reflux disease)   . Hair thinning   . HTN (hypertension)    pt denies   . Hyperplastic polyps of stomach 06/2002  . Hypothyroidism   . ICD (implantable cardiac defibrillator) in place    BiV/ICD; s/p removal of ICD with insertion of BiV PPM 04/26/12  . Insomnia 08/20/2015  . Kyphosis 08/20/2015  . LBBB (left bundle branch block)   .  Melanoma ([place])    right arm  . Mitral regurgitation   . Nonischemic cardiomyopathy ([place])    EF initially 20%; Last measurement up to 50% per echo in August of 2012  . Osteopenia   . Other and unspecified hyperlipidemia   . Other and unspecified hyperlipidemia   . Pacemaker 04/26/2012  . Pain in joint, lower leg   . Palpitations   . Restless legs syndrome (RLS)   . Synovial cyst of popliteal space   . Unspecified chronic bronchitis ([place])   . Unspecified nasal polyp   . Unspecified sinusitis (chronic)     Past Surgical History:  Procedure Laterality Date  . ABDOMINAL HYSTERECTOMY  1076   endometriosis  . APPENDECTOMY    . BI-VENTRICULAR PACEMAKER INSERTION N/A 04/26/2012   Procedure: BI-VENTRICULAR PACEMAKER INSERTION (CRT-P);  Surgeon: Evans Lance, MD;  Location: [facility] CATH LAB;  Service: Cardiovascular;  Laterality: N/A;  . COLONOSCOPY      . defibrillator insertion     s/p removal of previously implanted BiV ICD and insertion of a new BiV pacemaker on 04/26/12  . excise mole of lip  2001   Dr. Larena Sox  . excision of wen  1984   Dr. Parks Ranger  . FEMORAL HERNIA REPAIR  12/25/2012   Dr Dalbert Batman  . FEMORAL HERNIA REPAIR  01/04/2013   Recurrent - Dr. Lilyan Punt  . INCISIONAL HERNIA REPAIR N/A 10/01/2013   Procedure: LAPAROSCOPIC INCISIONAL HERNIA;  Surgeon: Gayland Curry, MD;  [facility];  Service: General;  Laterality: N/A;  . INGUINAL HERNIA REPAIR Right 09/26/2012   Procedure: RIGHT INGUINAL HERNIA REPAIR WITH MESH;  Surgeon: Odis Hollingshead, MD;  Location: [facility];  Service: General;  Laterality: Right;  . INGUINAL HERNIA REPAIR Right 12/25/2012   Procedure: explor right groin, small bowel rescection, tissue repair right femoral hernia;  Surgeon: Adin Hector, MD;  [facility];  Service: General;  Laterality: Right;  . INSERTION OF MESH Right 09/26/2012   Procedure: INSERTION OF MESH;  Surgeon: Odis Hollingshead, MD;  Location: [facility];  Service: General;  Laterality: Right;  . INSERTION OF MESH N/A 10/01/2013   Procedure: INSERTION OF MESH;  Surgeon: Gayland Curry, MD;  [facility];  Service: General;  Laterality: N/A;  . LAPAROSCOPY N/A 01/04/2013   Procedure: Diagnostic Laparoscopy, exploratory laparotomy with small bowel resection, closure of right femoral hernia repair;  Surgeon: Madilyn Hook, DO;  [facility];  Service: General;  Laterality: N/A;  . Left arm surgery    . POLYPECTOMY    . RIGHT BREAST LUMPECTOMY  1988   Dr. Marylene Buerger  . right knee surgery     arthroscopy: Dr. Shellia Carwin  . TONSILLECTOMY    . UPPER GASTROINTESTINAL ENDOSCOPY      Allergies  Allergen Reactions  . Hydrocodone Itching  . Cephalexin Itching and Swelling  . Ciprofloxacin     Not indicated due to aneurysm in aorta    . Doxycycline Other (See Comments)    Per pt: unknown  . Escitalopram     Dry mouth  .  Levofloxacin     Not indicated due to aneurysm in aorta   . Moxifloxacin     Not indicated due to aneurysm in aorta   . Ofloxacin Other (See Comments)    Per pt: unknown  . Sertraline     insomnia  . Sulfamethoxazole Hives    Any sulfa meds.  . Tape Other (See Comments)  Burns skin.   . Latex Rash    "If pt. Makes contact with or wearing"  . Neomycin Rash    Outpatient Encounter Medications as of 04/20/2018  Medication Sig  . acetaminophen (TYLENOL) 500 MG tablet Take 500 mg by mouth every 6 (six) hours as needed for pain. For pain  . Ascorbic Acid (VITAMIN C) 1000 MG tablet Take 1,000 mg by mouth daily.  . Biotin 5000 MCG TABS Take 1 tablet by mouth daily.  Marland Kitchen buPROPion (WELLBUTRIN XL) 150 MG 24 hr tablet TAKE 1 TABLET BY MOUTH EVERY DAY  . Calcium Carbonate-Vitamin D (CALCIUM + D PO) Take 2 tablets by mouth daily.   . Cyanocobalamin (VITAMIN B 12 PO) Take 1 tablet by mouth daily.   . furosemide (LASIX) 20 MG tablet Take 1 tablet (20 mg total) by mouth daily as needed for fluid or edema.  . Magnesium 250 MG TABS Take 1 tablet by mouth daily.   . Multiple Vitamin (MULTIVITAMIN WITH MINERALS) TABS Take 1 tablet by mouth daily.  . potassium chloride SA (K-DUR,KLOR-CON) 20 MEQ tablet Take 1 tablet (20 mEq total) by mouth daily as needed (only with lasix).  . RABEprazole (ACIPHEX) 20 MG tablet Take one tablet by mouth once daily for stomach  . SYNTHROID 100 MCG tablet TAKE 1 TABLET (100 MCG TOTAL) BY MOUTH DAILY BEFORE BREAKFAST.  Marland Kitchen trandolapril (MAVIK) 2 MG tablet TAKE 1 TABLET BY MOUTH TWICE A DAY  . XARELTO 20 MG TABS tablet TAKE 1 TABLET DAILY WITH   SUPPER  . [DISCONTINUED] amoxicillin-clavulanate (AUGMENTIN) 875-125 MG tablet Take 1 tablet by mouth 2 (two) times daily.  . [DISCONTINUED] ESTRACE VAGINAL 0.1 MG/GM vaginal cream Place 1 g vaginally 2 (two) times a week.   . [DISCONTINUED] mupirocin ointment (BACTROBAN) 2 % Apply 1 application topically daily. Apply to ankle wound  daily   Facility-Administered Encounter Medications as of 04/20/2018  Medication  . 0.9 %  sodium chloride infusion    Review of Systems:  Review of Systems  Constitutional: Positive for weight loss. Negative for chills, fever and malaise/fatigue.  HENT: Positive for congestion.   Eyes: Negative for blurred vision.  Respiratory: Positive for cough. Negative for sputum production, shortness of breath and wheezing.   Cardiovascular: Positive for leg swelling. Negative for chest pain and palpitations.       Left leg swelling since fall with ankle fx (felt to be old, but no prior injury known)  Gastrointestinal: Negative for abdominal pain, blood in stool, constipation, diarrhea, heartburn and melena.  Genitourinary: Negative for dysuria.  Musculoskeletal: Positive for back pain. Negative for falls and joint pain.  Skin: Negative for itching and rash.       Left lateral ankle ulcer remains  Neurological: Negative for loss of consciousness and headaches.  Endo/Heme/Allergies: Bruises/bleeds easily.  Psychiatric/Behavioral: Positive for depression. Negative for memory loss. The patient is nervous/anxious. The patient does not have insomnia.     Health Maintenance  Topic Date Due  . PNA vac Low Risk Adult (2 of 2 - PCV13) 03/13/2009  . MAMMOGRAM  09/14/2018  . TETANUS/TDAP  03/15/2021  . INFLUENZA VACCINE  Completed  . DEXA SCAN  Completed    Physical Exam: Vitals:   04/20/18 1514  BP: 130/60  Pulse: (!) 55  Temp: 97.7 F (36.5 C)  TempSrc: Oral  SpO2: 98%  Weight: 124 lb (56.2 kg)  Height: 5\' 8"  (1.727 m)   Body mass index is 18.85 kg/m. Physical Exam Vitals signs  reviewed.  Constitutional:      General: She is not in acute distress.    Appearance: She is not toxic-appearing.     Comments: Cachectic appearing   HENT:     Head: Normocephalic and atraumatic.  Eyes:     Comments: Reading glasses  Neck:     Musculoskeletal: Neck supple.  Cardiovascular:     Rate and  Rhythm: Normal rate and regular rhythm.     Pulses: Normal pulses.     Heart sounds: Normal heart sounds.  Pulmonary:     Effort: Pulmonary effort is normal.     Breath sounds: Normal breath sounds. No wheezing or rhonchi.  Abdominal:     Comments: Incisional hernia centrally just above mons pubis area--firm, nontender bulge present above incision  Musculoskeletal: Normal range of motion.     Comments: kyphotic  Skin:    General: Skin is warm and dry.     Comments: Left ankle with small open area with eschar present now (has previously been labeled as fat exposed)  Neurological:     General: No focal deficit present.     Mental Status: She is alert and oriented to person, place, and time.  Psychiatric:        Mood and Affect: Mood normal.     Labs reviewed: Basic Metabolic Panel: Recent Labs    04/28/17 1642 07/21/17 1550 12/08/17 1609  NA  --  134*  --   K  --  4.6  --   CL  --  103  --   CO2  --  28  --   GLUCOSE  --  87  --   BUN 19 34*  --   CREATININE 0.73 0.79  --   CALCIUM  --  9.1  --   TSH  --  0.31* 0.75   Liver Function Tests: Recent Labs    07/21/17 1550  AST 21  ALT 13  BILITOT 0.3  PROT 6.2   No results for input(s): LIPASE, AMYLASE in the last 8760 hours. No results for input(s): AMMONIA in the last 8760 hours. CBC: Recent Labs    07/21/17 1550 12/08/17 1609  WBC 6.5 7.2  NEUTROABS 3,855 4,370  HGB 11.4* 11.5*  HCT 33.8* 33.8*  MCV 86.0 89.7  PLT 196 189   Lipid Panel: Recent Labs    07/21/17 1550  CHOL 143  HDL 65  LDLCALC 58  TRIG 117  CHOLHDL 2.2   No results found for: HGBA1C  Procedures since last visit: No results found.  Assessment/Plan 1. Non-pressure chronic ulcer of left ankle with fat layer exposed ([place]) - likely venous component -she has had injury initially that led to this wound, but now not healing--is already being managed by ortho and derm so I'm not going to change mgt -stopped her compression hose from  Dr. Sharol Given b/c of challenges with the foot portion being too tight - COMPLETE METABOLIC PANEL WITH GFR  2. Hypothyroidism, unspecified type - thinks her weight is still related to this, but tsh has been normal in fall last year - TSH  3. Moderate malnutrition ([place]) -ongoing, does not eat healthy meals, has snacks, intake not very good--lots of fattening foods, but not nutritious choices - CBC with Differential/Platelet - COMPLETE METABOLIC PANEL WITH GFR  4. Moderate episode of recurrent major depressive disorder ([place]) -is finally going to start on wellbutrin xl in the morning she reports  5. Incisional hernia with obstruction but no gangrene -appears this  is not reducible any longer--I have not recently seen this--she has been having it checked with Dr [name] and will see him next week -no significant pain, no discoloration  Labs/tests ordered:   Orders Placed This Encounter  Procedures  . TSH  . CBC with Differential/Platelet  . COMPLETE METABOLIC PANEL WITH GFR   Next appt:  08/24/2018  Aitanna Haubner L. Maelee Hoot, D.O. [facility] [address], [place] [address] Cell Phone (Mon-Fri 8am-5pm):  [phone] On Call:  [phone] & follow prompts after 5pm & weekends Office Phone:  [phone] Office Fax:  [phone]

## 2018-04-21 ENCOUNTER — Ambulatory Visit (INDEPENDENT_AMBULATORY_CARE_PROVIDER_SITE_OTHER): Payer: Medicare Other | Admitting: Internal Medicine

## 2018-04-21 ENCOUNTER — Encounter: Payer: Self-pay | Admitting: Internal Medicine

## 2018-04-21 VITALS — BP 124/62 | HR 55 | Ht 68.0 in | Wt 125.0 lb

## 2018-04-21 DIAGNOSIS — Z950 Presence of cardiac pacemaker: Secondary | ICD-10-CM

## 2018-04-21 DIAGNOSIS — I5022 Chronic systolic (congestive) heart failure: Secondary | ICD-10-CM

## 2018-04-21 LAB — CBC WITH DIFFERENTIAL/PLATELET
Absolute Monocytes: 784 cells/uL (ref 200–950)
Basophils Absolute: 28 cells/uL (ref 0–200)
Basophils Relative: 0.4 %
Eosinophils Absolute: 189 cells/uL (ref 15–500)
Eosinophils Relative: 2.7 %
HCT: 32 % — ABNORMAL LOW (ref 35.0–45.0)
Hemoglobin: 10.7 g/dL — ABNORMAL LOW (ref 11.7–15.5)
Lymphs Abs: 1540 cells/uL (ref 850–3900)
MCH: 29.3 pg (ref 27.0–33.0)
MCHC: 33.4 g/dL (ref 32.0–36.0)
MCV: 87.7 fL (ref 80.0–100.0)
MPV: 10.2 fL (ref 7.5–12.5)
Monocytes Relative: 11.2 %
Neutro Abs: 4459 cells/uL (ref 1500–7800)
Neutrophils Relative %: 63.7 %
Platelets: 215 10*3/uL (ref 140–400)
RBC: 3.65 10*6/uL — ABNORMAL LOW (ref 3.80–5.10)
RDW: 12.2 % (ref 11.0–15.0)
Total Lymphocyte: 22 %
WBC: 7 10*3/uL (ref 3.8–10.8)

## 2018-04-21 LAB — COMPLETE METABOLIC PANEL WITH GFR
AG Ratio: 1.8 (calc) (ref 1.0–2.5)
ALT: 15 U/L (ref 6–29)
AST: 20 U/L (ref 10–35)
Albumin: 4 g/dL (ref 3.6–5.1)
Alkaline phosphatase (APISO): 81 U/L (ref 37–153)
BUN/Creatinine Ratio: 39 (calc) — ABNORMAL HIGH (ref 6–22)
BUN: 29 mg/dL — ABNORMAL HIGH (ref 7–25)
CO2: 25 mmol/L (ref 20–32)
Calcium: 9.1 mg/dL (ref 8.6–10.4)
Chloride: 104 mmol/L (ref 98–110)
Creat: 0.75 mg/dL (ref 0.60–0.88)
GFR, Est African American: 85 mL/min/{1.73_m2} (ref >=60)
GFR, Est Non African American: 74 mL/min/{1.73_m2} (ref >=60)
Globulin: 2.2 g/dL (calc) (ref 1.9–3.7)
Glucose, Bld: 84 mg/dL (ref 65–139)
Potassium: 4.5 mmol/L (ref 3.5–5.3)
Sodium: 137 mmol/L (ref 135–146)
Total Bilirubin: 0.3 mg/dL (ref 0.2–1.2)
Total Protein: 6.2 g/dL (ref 6.1–8.1)

## 2018-04-21 LAB — TSH: TSH: 1.82 mIU/L (ref 0.40–4.50)

## 2018-04-21 NOTE — Patient Instructions (Signed)
Medication Instructions:  Your physician recommends that you continue on your current medications as directed. Please refer to the Current Medication list given to you today.  Labwork: None ordered.  Testing/Procedures: None ordered.  Follow-Up:  Your physician wants you to follow-up in:  3 months with the device clinic for a device check.   Your physician wants you to follow-up in: one year with Dr. Taylor.   You will receive a reminder letter in the mail two months in advance. If you don't receive a letter, please call our office to schedule the follow-up appointment.  Any Other Special Instructions Will Be Listed Below (If Applicable).  If you need a refill on your cardiac medications before your next appointment, please call your pharmacy.   

## 2018-04-21 NOTE — Progress Notes (Signed)
HPI [name] returns today for ongoing evaluation and management of her biv PPM in the setting of CHF. She underwent initial Biv ICD implant and then had normalization of her LV function. She has undergone gen change to a biv ppm. She has had chronic elevated thresholds in her rv lead. In the interim, she has had some problems with depression. She also notes another hernia. She has previously had multiple problems after hernia surgery. Allergies  Allergen Reactions  . Hydrocodone Itching  . Cephalexin Itching and Swelling  . Ciprofloxacin     Not indicated due to aneurysm in aorta    . Doxycycline Other (See Comments)    Per pt: unknown  . Escitalopram     Dry mouth  . Levofloxacin     Not indicated due to aneurysm in aorta   . Moxifloxacin     Not indicated due to aneurysm in aorta   . Ofloxacin Other (See Comments)    Per pt: unknown  . Sertraline     insomnia  . Sulfamethoxazole Hives    Any sulfa meds.  . Tape Other (See Comments)    Burns skin.   . Latex Rash    "If pt. Makes contact with or wearing"  . Neomycin Rash     Current Outpatient Medications  Medication Sig Dispense Refill  . acetaminophen (TYLENOL) 500 MG tablet Take 500 mg by mouth every 6 (six) hours as needed for pain. For pain    . Ascorbic Acid (VITAMIN C) 1000 MG tablet Take 1,000 mg by mouth daily.    . Biotin 5000 MCG TABS Take 1 tablet by mouth daily.    Marland Kitchen buPROPion (WELLBUTRIN XL) 150 MG 24 hr tablet TAKE 1 TABLET BY MOUTH EVERY DAY 90 tablet 1  . Calcium Carbonate-Vitamin D (CALCIUM + D PO) Take 2 tablets by mouth daily.     . Cyanocobalamin (VITAMIN B 12 PO) Take 1 tablet by mouth daily.     . furosemide (LASIX) 20 MG tablet Take 1 tablet (20 mg total) by mouth daily as needed for fluid or edema. 30 tablet 6  . Magnesium 250 MG TABS Take 1 tablet by mouth daily.     . Multiple Vitamin (MULTIVITAMIN WITH MINERALS) TABS Take 1 tablet by mouth daily.    . potassium chloride SA  (K-DUR,KLOR-CON) 20 MEQ tablet Take 1 tablet (20 mEq total) by mouth daily as needed (only with lasix). 30 tablet 6  . RABEprazole (ACIPHEX) 20 MG tablet Take one tablet by mouth once daily for stomach 90 tablet 1  . SYNTHROID 100 MCG tablet TAKE 1 TABLET (100 MCG TOTAL) BY MOUTH DAILY BEFORE BREAKFAST. 90 tablet 1  . trandolapril (MAVIK) 2 MG tablet TAKE 1 TABLET BY MOUTH TWICE A DAY 180 tablet 3  . XARELTO 20 MG TABS tablet TAKE 1 TABLET DAILY WITH   SUPPER 90 tablet 1   Current Facility-Administered Medications  Medication Dose Route Frequency Provider Last Rate Last Dose  . 0.9 %  sodium chloride infusion  500 mL Intravenous Once Milus Banister, MD         Past Medical History:  Diagnosis Date  . Allergy    all year   . Anxiety disorder   . Arthritis   . Barrett's esophagus 04/2008  . Benign neoplasm of colon   . Blood transfusion without reported diagnosis    long ago per pt  . Cataract    removed both eyes  .  CHF (congestive heart failure) ([place])   . Chronic airway obstruction, not elsewhere classified   . Complication of anesthesia    slow to wake up  . Depressive disorder, not elsewhere classified   . Diverticulitis   . Dysthymic disorder   . Erosive gastritis 08/2004  . GERD (gastroesophageal reflux disease)   . Hair thinning   . HTN (hypertension)    pt denies   . Hyperplastic polyps of stomach 06/2002  . Hypothyroidism   . ICD (implantable cardiac defibrillator) in place    BiV/ICD; s/p removal of ICD with insertion of BiV PPM 04/26/12  . Insomnia 08/20/2015  . Kyphosis 08/20/2015  . LBBB (left bundle branch block)   . Melanoma ([place])    right arm  . Mitral regurgitation   . Nonischemic cardiomyopathy ([place])    EF initially 20%; Last measurement up to 50% per echo in August of 2012  . Osteopenia   . Other and unspecified hyperlipidemia   . Other and unspecified hyperlipidemia   . Pacemaker 04/26/2012  . Pain in joint, lower leg   . Palpitations   . Restless  legs syndrome (RLS)   . Synovial cyst of popliteal space   . Unspecified chronic bronchitis ([place])   . Unspecified nasal polyp   . Unspecified sinusitis (chronic)     ROS:   All systems reviewed and negative except as noted in the HPI.   Past Surgical History:  Procedure Laterality Date  . ABDOMINAL HYSTERECTOMY  1076   endometriosis  . APPENDECTOMY    . BI-VENTRICULAR PACEMAKER INSERTION N/A 04/26/2012   Procedure: BI-VENTRICULAR PACEMAKER INSERTION (CRT-P);  Surgeon: Evans Lance, MD;  Location: [facility] CATH LAB;  Service: Cardiovascular;  Laterality: N/A;  . COLONOSCOPY    . defibrillator insertion     s/p removal of previously implanted BiV ICD and insertion of a new BiV pacemaker on 04/26/12  . excise mole of lip  2001   Dr. Larena Sox  . excision of wen  1984   Dr. Parks Ranger  . FEMORAL HERNIA REPAIR  12/25/2012   Dr Dalbert Batman  . FEMORAL HERNIA REPAIR  01/04/2013   Recurrent - Dr. Lilyan Punt  . INCISIONAL HERNIA REPAIR N/A 10/01/2013   Procedure: LAPAROSCOPIC INCISIONAL HERNIA;  Surgeon: Gayland Curry, MD;  Location: [facility];  Service: General;  Laterality: N/A;  . INGUINAL HERNIA REPAIR Right 09/26/2012   Procedure: RIGHT INGUINAL HERNIA REPAIR WITH MESH;  Surgeon: Odis Hollingshead, MD;  Location: [place];  Service: General;  Laterality: Right;  . INGUINAL HERNIA REPAIR Right 12/25/2012   Procedure: explor right groin, small bowel rescection, tissue repair right femoral hernia;  Surgeon: Adin Hector, MD;  [facility]: [facility];  Service: General;  Laterality: Right;  . INSERTION OF MESH Right 09/26/2012   Procedure: INSERTION OF MESH;  Surgeon: Odis Hollingshead, MD;  Location: [place];  Service: General;  Laterality: Right;  . INSERTION OF MESH N/A 10/01/2013   Procedure: INSERTION OF MESH;  Surgeon: Gayland Curry, MD;  Location[facility];  Service: General;  Laterality: N/A;  . LAPAROSCOPY N/A 01/04/2013   Procedure: Diagnostic Laparoscopy, exploratory laparotomy with small bowel resection,  closure of right femoral hernia repair;  Surgeon: Madilyn Hook, DO;  Location[facility];  Service: General;  Laterality: N/A;  . Left arm surgery    . POLYPECTOMY    . RIGHT BREAST LUMPECTOMY  1988   Dr. Marylene Buerger  . right knee surgery     arthroscopy:  Dr. Shellia Carwin  . TONSILLECTOMY    . UPPER GASTROINTESTINAL ENDOSCOPY       Family History  Problem Relation Age of Onset  . Stroke Father   . Heart disease Other        maternal side  . Colon cancer Paternal Uncle   . Breast cancer Maternal Aunt   . Colon cancer Cousin   . Diabetes Neg Hx   . Colon polyps Neg Hx   . Rectal cancer Neg Hx   . Stomach cancer Neg Hx      Social History   Socioeconomic History  . Marital status: Single    Spouse name: Not on file  . Number of children: 0  . Years of education: Not on file  . Highest education level: Not on file  Occupational History  . Occupation: Retired    Fish farm manager: RETIRED  Social Needs  . Financial resource strain: Not on file  . Food insecurity:    Worry: Not on file    Inability: Not on file  . Transportation needs:    Medical: Not on file    Non-medical: Not on file  Tobacco Use  . Smoking status: Former Smoker    Packs/day: 1.00    Years: 15.00    Pack years: 15.00    Types: Cigarettes    Last attempt to quit: 12/22/1993    Years since quitting: 24.3  . Smokeless tobacco: Never Used  . Tobacco comment: Does'nt reall year quit   Substance and Sexual Activity  . Alcohol use: No    Alcohol/week: 0.0 standard drinks  . Drug use: No  . Sexual activity: Never  Lifestyle  . Physical activity:    Days per week: Not on file    Minutes per session: Not on file  . Stress: Not on file  Relationships  . Social connections:    Talks on phone: Not on file    Gets together: Not on file    Attends religious service: Not on file    Active member of club or organization: Not on file    Attends meetings of clubs or organizations: Not on file    Relationship  status: Not on file  . Intimate partner violence:    Fear of current or ex partner: Not on file    Emotionally abused: Not on file    Physically abused: Not on file    Forced sexual activity: Not on file  Other Topics Concern  . Not on file  Social History Narrative   Pt lives alone in a condo     BP 124/62   Pulse (!) 55   Ht 5\' 8"  (1.727 m)   Wt 125 lb (56.7 kg)   BMI 19.01 kg/m   Physical Exam:  Well appearing elderly woman with scoliosis, NAD HEENT: Unremarkable Neck:  6 cm JVD, no thyromegally Lymphatics:  No adenopathy Back:  No CVA tenderness Lungs:  Clear with no wheezes HEART:  Regular rate rhythm, no murmurs, no rubs, no clicks Abd:  soft, positive bowel sounds, no organomegally, no rebound, no guarding Ext:  2 plus pulses, no edema, no cyanosis, no clubbing Skin:  No rashes no nodules Neuro:  CN II through XII intact, motor grossly intact  EKG - nsr with ventricular pacing  DEVICE  Normal device function.  See PaceArt for details.   Assess/Plan: 1. Chronic systolic heart failure - her symptoms are class 2. Her EF is mostly preserved. She will continue her current meds.  2. PPM - her [employer] PPM is working normally and she is approaching ERI.  3. PAF - she is maintaining NSR and on systemic anti-coagulation.  Mikle Bosworth.D.

## 2018-04-25 LAB — CUP PACEART INCLINIC DEVICE CHECK
Date Time Interrogation Session: 20200214050000
Implantable Lead Implant Date: 20070309
Implantable Lead Implant Date: 20070309
Implantable Lead Implant Date: 20070309
Implantable Lead Location: 753858
Implantable Lead Location: 753859
Implantable Lead Location: 753860
Implantable Lead Model: 158
Implantable Lead Model: 4087
Implantable Lead Serial Number: 121652
Implantable Lead Serial Number: 268502
Implantable Pulse Generator Implant Date: 20140219
Lead Channel Impedance Value: 391 Ohm
Lead Channel Impedance Value: 495 Ohm
Lead Channel Impedance Value: 587 Ohm
Lead Channel Pacing Threshold Amplitude: 0.8 V
Lead Channel Pacing Threshold Amplitude: 1.4 V
Lead Channel Pacing Threshold Amplitude: 2.9 V
Lead Channel Pacing Threshold Pulse Width: 0.4 ms
Lead Channel Pacing Threshold Pulse Width: 1 ms
Lead Channel Pacing Threshold Pulse Width: 1.2 ms
Lead Channel Sensing Intrinsic Amplitude: 13 mV
Lead Channel Sensing Intrinsic Amplitude: 2.4 mV
Lead Channel Sensing Intrinsic Amplitude: 6 mV
Lead Channel Setting Pacing Amplitude: 2 V
Lead Channel Setting Pacing Amplitude: 2.5 V
Lead Channel Setting Pacing Amplitude: 3.5 V
Lead Channel Setting Pacing Pulse Width: 1 ms
Lead Channel Setting Sensing Sensitivity: 2.5 mV
Lead Channel Setting Sensing Sensitivity: 2.5 mV
MDC IDC LEAD SERIAL: 168416
MDC IDC SET LEADCHNL RV PACING PULSEWIDTH: 1.2 ms
Pulse Gen Serial Number: 100543

## 2018-04-26 DIAGNOSIS — K432 Incisional hernia without obstruction or gangrene: Secondary | ICD-10-CM | POA: Diagnosis not present

## 2018-04-28 ENCOUNTER — Other Ambulatory Visit: Payer: Self-pay | Admitting: General Surgery

## 2018-04-28 DIAGNOSIS — R1030 Lower abdominal pain, unspecified: Secondary | ICD-10-CM

## 2018-05-09 ENCOUNTER — Ambulatory Visit
Admission: RE | Admit: 2018-05-09 | Discharge: 2018-05-09 | Disposition: A | Discharge status: Home / Self Care | Payer: Medicare Other | Source: Ambulatory Visit | Attending: General Surgery | Admitting: General Surgery

## 2018-05-09 DIAGNOSIS — K5730 Diverticulosis of large intestine without perforation or abscess without bleeding: Secondary | ICD-10-CM | POA: Diagnosis not present

## 2018-05-09 DIAGNOSIS — R1030 Lower abdominal pain, unspecified: Secondary | ICD-10-CM

## 2018-05-09 MED ORDER — IOPAMIDOL (ISOVUE-300) INJECTION 61%
100.0000 mL | Freq: Once | INTRAVENOUS | Status: AC | PRN
  Administered 2018-05-09: 100 mL via INTRAVENOUS

## 2018-05-09 MED ORDER — IOHEXOL 300 MG/ML  SOLN
25.0000 mL | Freq: Once | INTRAMUSCULAR | Status: AC | PRN
  Administered 2018-05-09: 25 mL via ORAL

## 2018-05-16 DIAGNOSIS — L812 Freckles: Secondary | ICD-10-CM | POA: Diagnosis not present

## 2018-05-16 DIAGNOSIS — L821 Other seborrheic keratosis: Secondary | ICD-10-CM | POA: Diagnosis not present

## 2018-05-16 DIAGNOSIS — Z85820 Personal history of malignant melanoma of skin: Secondary | ICD-10-CM | POA: Diagnosis not present

## 2018-05-16 DIAGNOSIS — L98499 Non-pressure chronic ulcer of skin of other sites with unspecified severity: Secondary | ICD-10-CM | POA: Diagnosis not present

## 2018-06-09 DIAGNOSIS — J018 Other acute sinusitis: Secondary | ICD-10-CM | POA: Diagnosis not present

## 2018-06-09 DIAGNOSIS — J300 Vasomotor rhinitis: Secondary | ICD-10-CM | POA: Diagnosis not present

## 2018-07-11 ENCOUNTER — Other Ambulatory Visit: Payer: Self-pay | Admitting: Internal Medicine

## 2018-07-12 DIAGNOSIS — I5022 Chronic systolic (congestive) heart failure: Secondary | ICD-10-CM | POA: Diagnosis not present

## 2018-07-12 DIAGNOSIS — Z950 Presence of cardiac pacemaker: Secondary | ICD-10-CM | POA: Diagnosis not present

## 2018-07-12 DIAGNOSIS — F329 Major depressive disorder, single episode, unspecified: Secondary | ICD-10-CM | POA: Diagnosis not present

## 2018-07-12 DIAGNOSIS — K432 Incisional hernia without obstruction or gangrene: Secondary | ICD-10-CM | POA: Diagnosis not present

## 2018-07-20 ENCOUNTER — Telehealth: Payer: Self-pay

## 2018-07-20 NOTE — Telephone Encounter (Signed)
Patient with diagnosis of afib on Xarelto for anticoagulation.    Procedure: Hernia Surgery Date of procedure: TBD  CHADS2-VASc score of  6 (CHF, HTN, AGE, DM2, stroke/tia x 2, CAD, AGE, female)  CrCl 31ml/min  Per office protocol, patient can hold Xarelto for 2 days prior to procedure.

## 2018-07-20 NOTE — Telephone Encounter (Signed)
I called the patient regarding her upcoming surgery. She notes that she has an appointment with Dr. [name] on 5/20 for pre-op surgical assessment.  I will route this request to Dr. [name] and remove from the pre-op pool. I will also route to pharmacy for input on anticoagulation.

## 2018-07-20 NOTE — Telephone Encounter (Signed)
   [facility] Pre-operative Risk Assessment    Request for surgical clearance:  1. What type of surgery is being performed? Hernia Surger   2. When is this surgery scheduled? TBD   3. What type of clearance is required (medical clearance vs. Pharmacy clearance to hold med vs. Both)? Both  4. Are there any medications that need to be held prior to surgery and how long?Xarelto   5. Practice name and name of physician performing surgery? [facility]   6. What is your office phone number [phone]    7.   What is your office fax number [phone] ATTN: Dia Crawford  8.   Anesthesia type (None, local, MAC, general) ? General   Meryl Crutch 07/20/2018, 8:29 AM  _________________________________________________________________   (provider comments below)

## 2018-07-23 NOTE — Progress Notes (Signed)
Virtual Visit via Telephone Note   This visit type was conducted due to national recommendations for restrictions regarding the COVID-19 Pandemic (e.g. social distancing) in an effort to limit this patient's exposure and mitigate transmission in our community.  Due to her co-morbid illnesses, this patient is at least at moderate risk for complications without adequate follow up.  This format is felt to be most appropriate for this patient at this time.  The patient did not have access to video technology/had technical difficulties with video requiring transitioning to audio format only (telephone).  All issues noted in this document were discussed and addressed.  No physical exam could be performed with this format.  Please refer to the patient's chart for her  consent to telehealth for [facility].   Date:  07/26/2018   ID:  KINETA FUDALA, DOB September 02, 1932, MRN [id number]  Patient Location: Home Provider Location: Office  PCP:  Gayland Curry, DO  Cardiologist:  Liliahna Cudd [name] MD Electrophysiologist:  Crissie Sickles MD  Evaluation Performed:  Follow-Up Visit  Chief Complaint:  F/u cardiomyopathy  History of Present Illness:    [name] is a 85 y.o. female with  multiple medical issues which include a cardiomyopathy, underlying BiV/ICD which resulted in improvement of her EF, LBBB, HTN, thyroid disease, melanoma, anxiety/depression and chronic systolic HF.   In September 2018 she was noted in device clinic to have paroxysmal Afib with controlled rate. She was started on Eliquis. [place] Vasc score of at least 4. She developed itching on Eliquis and switched to Xarelto. Itching did not go away so she was switched to Coumadin. After about a month the itching resolved.  She was later switched back to Xarelto without rash. ICD was switched out for a CRT device. She does have a small AAA 2.8 cm by CT in March which is stable.   Her last device follow up yesterday looked good.   She does  have an incisional hernia and is evaluated by Dr [name] for potential surgery.   She does note chronic fatigue but has no dyspnea, chest pain or edema. No palpitations.   The patient does not have symptoms concerning for COVID-19 infection (fever, chills, cough, or new shortness of breath).    Past Medical History:  Diagnosis Date  . Allergy    all year   . Anxiety disorder   . Arthritis   . Barrett's esophagus 04/2008  . Benign neoplasm of colon   . Blood transfusion without reported diagnosis    long ago per pt  . Cataract    removed both eyes  . CHF (congestive heart failure) ([place])   . Chronic airway obstruction, not elsewhere classified   . Complication of anesthesia    slow to wake up  . Depressive disorder, not elsewhere classified   . Diverticulitis   . Dysthymic disorder   . Erosive gastritis 08/2004  . GERD (gastroesophageal reflux disease)   . Hair thinning   . HTN (hypertension)    pt denies   . Hyperplastic polyps of stomach 06/2002  . Hypothyroidism   . ICD (implantable cardiac defibrillator) in place    BiV/ICD; s/p removal of ICD with insertion of BiV PPM 04/26/12  . Insomnia 08/20/2015  . Kyphosis 08/20/2015  . LBBB (left bundle branch block)   . Melanoma ([place])    right arm  . Mitral regurgitation   . Nonischemic cardiomyopathy ([place])    EF initially 20%; Last measurement up to 50% per  echo in August of 2012  . Osteopenia   . Other and unspecified hyperlipidemia   . Other and unspecified hyperlipidemia   . Pacemaker 04/26/2012  . Pain in joint, lower leg   . Palpitations   . Restless legs syndrome (RLS)   . Synovial cyst of popliteal space   . Unspecified chronic bronchitis ([place])   . Unspecified nasal polyp   . Unspecified sinusitis (chronic)    Past Surgical History:  Procedure Laterality Date  . ABDOMINAL HYSTERECTOMY  1076   endometriosis  . APPENDECTOMY    . BI-VENTRICULAR PACEMAKER INSERTION N/A 04/26/2012   Procedure: BI-VENTRICULAR PACEMAKER  INSERTION (CRT-P);  Surgeon: Evans Lance, MD;  Location: [facility] CATH LAB;  Service: Cardiovascular;  Laterality: N/A;  . COLONOSCOPY    . defibrillator insertion     s/p removal of previously implanted BiV ICD and insertion of a new BiV pacemaker on 04/26/12  . excise mole of lip  2001   Dr. Larena Sox  . excision of wen  1984   Dr. Parks Ranger  . FEMORAL HERNIA REPAIR  12/25/2012   Dr Dalbert Batman  . FEMORAL HERNIA REPAIR  01/04/2013   Recurrent - Dr. Lilyan Punt  . INCISIONAL HERNIA REPAIR N/A 10/01/2013   Procedure: LAPAROSCOPIC INCISIONAL HERNIA;  Surgeon: Gayland Curry, MD;  [facility];  Service: General;  Laterality: N/A;  . INGUINAL HERNIA REPAIR Right 09/26/2012   Procedure: RIGHT INGUINAL HERNIA REPAIR WITH MESH;  Surgeon: Odis Hollingshead, MD;  Location: [place];  Service: General;  Laterality: Right;  . INGUINAL HERNIA REPAIR Right 12/25/2012   Procedure: explor right groin, small bowel rescection, tissue repair right femoral hernia;  Surgeon: Adin Hector, MD;  [facility];  Service: General;  Laterality: Right;  . INSERTION OF MESH Right 09/26/2012   Procedure: INSERTION OF MESH;  Surgeon: Odis Hollingshead, MD;  Location: [facility];  Service: General;  Laterality: Right;  . INSERTION OF MESH N/A 10/01/2013   Procedure: INSERTION OF MESH;  Surgeon: Gayland Curry, MD;  [facility];  Service: General;  Laterality: N/A;  . LAPAROSCOPY N/A 01/04/2013   Procedure: Diagnostic Laparoscopy, exploratory laparotomy with small bowel resection, closure of right femoral hernia repair;  Surgeon: Madilyn Hook, DO;  [facility];  Service: General;  Laterality: N/A;  . Left arm surgery    . POLYPECTOMY    . RIGHT BREAST LUMPECTOMY  1988   Dr. Marylene Buerger  . right knee surgery     arthroscopy: Dr. Shellia Carwin  . TONSILLECTOMY    . UPPER GASTROINTESTINAL ENDOSCOPY       Current Meds  Medication Sig  . acetaminophen (TYLENOL) 500 MG tablet Take 500 mg by mouth every 6 (six) hours as needed  for pain. For pain  . Ascorbic Acid (VITAMIN C) 1000 MG tablet Take 1,000 mg by mouth daily.  . Biotin 5000 MCG TABS Take 1 tablet by mouth daily.  . Calcium Carbonate-Vitamin D (CALCIUM + D PO) Take 2 tablets by mouth daily.   . Cyanocobalamin (VITAMIN B 12 PO) Take 1 tablet by mouth daily.   . Magnesium 250 MG TABS Take 1 tablet by mouth daily.   . Multiple Vitamin (MULTIVITAMIN WITH MINERALS) TABS Take 1 tablet by mouth daily.  . RABEprazole (ACIPHEX) 20 MG tablet Take one tablet by mouth once daily for stomach  . rivaroxaban (XARELTO) 20 MG TABS tablet Take 1 tablet (20 mg total) by mouth daily with supper.  Marland Kitchen SYNTHROID 100  MCG tablet TAKE 1 TABLET (100 MCG TOTAL) BY MOUTH DAILY BEFORE BREAKFAST.  . [DISCONTINUED] furosemide (LASIX) 20 MG tablet Take 1 tablet (20 mg total) by mouth daily as needed for fluid or edema.  . [DISCONTINUED] potassium chloride SA (K-DUR,KLOR-CON) 20 MEQ tablet Take 1 tablet (20 mEq total) by mouth daily as needed (only with lasix).  . [DISCONTINUED] trandolapril (MAVIK) 2 MG tablet TAKE 1 TABLET BY MOUTH TWICE A DAY  . [DISCONTINUED] XARELTO 20 MG TABS tablet TAKE 1 TABLET DAILY WITH   SUPPER   Current Facility-Administered Medications for the 07/26/18 encounter (Telemedicine) with [name], Lyndsie Wallman M, MD  Medication  . 0.9 %  sodium chloride infusion     Allergies:   Hydrocodone; Cephalexin; Ciprofloxacin; Doxycycline; Escitalopram; Levofloxacin; Moxifloxacin; Ofloxacin; Sertraline; Sulfamethoxazole; Tape; Latex; and Neomycin   Social History   Tobacco Use  . Smoking status: Former Smoker    Packs/day: 1.00    Years: 15.00    Pack years: 15.00    Types: Cigarettes    Last attempt to quit: 12/22/1993    Years since quitting: 24.6  . Smokeless tobacco: Never Used  . Tobacco comment: Does'nt reall year quit   Substance Use Topics  . Alcohol use: No    Alcohol/week: 0.0 standard drinks  . Drug use: No     Family Hx: The patient's family history includes  Breast cancer in her maternal aunt; Colon cancer in her cousin and paternal uncle; Heart disease in an other family member; Stroke in her father. There is no history of Diabetes, Colon polyps, Rectal cancer, or Stomach cancer.  ROS:   Please see the history of present illness.    All other systems reviewed and are negative.   Prior CV studies:   The following studies were reviewed today:  Echo May 2018: Study Conclusions  - Left ventricle: The cavity size was mildly dilated. Wall   thickness was normal. Systolic function was normal. The estimated   ejection fraction was in the range of 50% to 55%. Wall motion was   normal; there were no regional wall motion abnormalities. - Aortic valve: There was trivial regurgitation. - Mitral valve: There was mild regurgitation. - Atrial septum: No defect or patent foramen ovale was identified.  Labs/Other Tests and Data Reviewed:    EKG:  No ECG reviewed.  Recent Labs: 04/20/2018: ALT 15; BUN 29; Creat 0.75; Hemoglobin 10.7; Platelets 215; Potassium 4.5; Sodium 137; TSH 1.82   Recent Lipid Panel Lab Results  Component Value Date/Time   CHOL 143 07/21/2017 03:50 PM   CHOL 168 08/18/2015 09:43 AM   TRIG 117 07/21/2017 03:50 PM   HDL 65 07/21/2017 03:50 PM   HDL 71 08/18/2015 09:43 AM   CHOLHDL 2.2 07/21/2017 03:50 PM   LDLCALC 58 07/21/2017 03:50 PM    Wt Readings from Last 3 Encounters:  07/26/18 121 lb 12.8 oz (55.2 kg)  04/21/18 125 lb (56.7 kg)  04/20/18 124 lb (56.2 kg)     Objective:    Vital Signs:  BP 110/72   Pulse (!) 56   Ht 5\' 8"  (1.727 m)   Wt 121 lb 12.8 oz (55.2 kg)   BMI 18.52 kg/m    VITAL SIGNS:  reviewed  ASSESSMENT & PLAN:    1.  History of dialted Cardiomyopathy with chronic systolic CHF.  EF last normal 55% in May 2018- has BiV/ICD in place.    She is asymptomatic.   2. NSVT. Follow up ICD/biV in device clinic.  3. Paroxysmal Afib. [place] Vasc score of at least 4. On Xarelto. Tolerating well.  Afib burden < 1% by device.   4. Small AAA and left iliac aneurysm. Follow up with [facility]. Last doppler in August 2018 showed iliac diameter 1.6 cm and Aorta 2.4 cm. Recent CT of Abdomen/pelvis looked OK 2.8 cm.  5. Incisional hernia. She is clear for surgery from a cardiac standpoint. Will need to hold Xarelto 48 hours prior to surgery.    COVID-19 Education: The signs and symptoms of COVID-19 were discussed with the patient and how to seek care for testing (follow up with PCP or arrange E-visit).  The importance of social distancing was discussed today.  Time:   Today, I have spent 15 minutes with the patient with telehealth technology discussing the above problems.     Medication Adjustments/Labs and Tests Ordered: Current medicines are reviewed at length with the patient today.  Concerns regarding medicines are outlined above.   Tests Ordered: No orders of the defined types were placed in this encounter.   Medication Changes: Meds ordered this encounter  Medications  . rivaroxaban (XARELTO) 20 MG TABS tablet    Sig: Take 1 tablet (20 mg total) by mouth daily with supper.    Dispense:  90 tablet    Refill:  3    Disposition:  Follow up in 6 month(s)  Signed, Keliah Harned [name], MD  07/26/2018 3:08 PM    [facility]

## 2018-07-25 ENCOUNTER — Telehealth: Payer: Self-pay | Admitting: Cardiology

## 2018-07-25 ENCOUNTER — Other Ambulatory Visit: Payer: Self-pay

## 2018-07-25 ENCOUNTER — Ambulatory Visit (INDEPENDENT_AMBULATORY_CARE_PROVIDER_SITE_OTHER): Payer: Medicare Other | Admitting: *Deleted

## 2018-07-25 DIAGNOSIS — I5022 Chronic systolic (congestive) heart failure: Secondary | ICD-10-CM

## 2018-07-25 LAB — CUP PACEART INCLINIC DEVICE CHECK
Brady Statistic RA Percent Paced: 33 %
Brady Statistic RV Percent Paced: 100 %
Date Time Interrogation Session: 20200519040000
Implantable Lead Implant Date: 20070309
Implantable Lead Implant Date: 20070309
Implantable Lead Implant Date: 20070309
Implantable Lead Location: 753858
Implantable Lead Location: 753859
Implantable Lead Location: 753860
Implantable Lead Model: 158
Implantable Lead Model: 4087
Implantable Lead Model: 4543
Implantable Lead Serial Number: 121652
Implantable Lead Serial Number: 168416
Implantable Lead Serial Number: 268502
Implantable Pulse Generator Implant Date: 20140219
Lead Channel Impedance Value: 393 Ohm
Lead Channel Impedance Value: 491 Ohm
Lead Channel Impedance Value: 507 Ohm
Lead Channel Pacing Threshold Amplitude: 0.9 V
Lead Channel Pacing Threshold Amplitude: 1.7 V
Lead Channel Pacing Threshold Amplitude: 3.2 V
Lead Channel Pacing Threshold Pulse Width: 0.4 ms
Lead Channel Pacing Threshold Pulse Width: 1 ms
Lead Channel Pacing Threshold Pulse Width: 1.2 ms
Lead Channel Sensing Intrinsic Amplitude: 1.7 mV
Lead Channel Sensing Intrinsic Amplitude: 16.5 mV
Lead Channel Sensing Intrinsic Amplitude: 6.1 mV
Lead Channel Setting Pacing Amplitude: 2 V
Lead Channel Setting Pacing Amplitude: 2.5 V
Lead Channel Setting Pacing Amplitude: 3.5 V
Lead Channel Setting Pacing Pulse Width: 1 ms
Lead Channel Setting Pacing Pulse Width: 1.2 ms
Lead Channel Setting Sensing Sensitivity: 2.5 mV
Lead Channel Setting Sensing Sensitivity: 2.5 mV
Pulse Gen Serial Number: 100543

## 2018-07-25 NOTE — Progress Notes (Signed)
CRT-P device check in clinic. Normal device function. Thresholds, sensing, impedance consistent with previous measurements. RV threshold chronically elevated, 3.2 V @ 1.2 ms today. No changes to outputs as patient is not device dependent. Histograms appropriate for patient and level of activity. <1% AT/AF burden + xarelto. Atrial histograms suggested higher burden appears likely due to intermittent FFRWs in blanking. No changes today. 3 ventricular high rate episodes, appear 1:1 SVT/AT. Patient bi-ventricularly pacing 100% of the time. Device programmed with appropriate safety margins. Device heart failure diagnostics are within normal limits and stable over time. Estimated longevity 5 months. F/u battery check scheduled for 09/26/18. Patient declines remote monitoring.

## 2018-07-25 NOTE — Telephone Encounter (Signed)
Declined mychart, no smartphone, consent, pre reg complete 07/25/18 AF

## 2018-07-26 ENCOUNTER — Telehealth (INDEPENDENT_AMBULATORY_CARE_PROVIDER_SITE_OTHER): Payer: Medicare Other | Admitting: Cardiology

## 2018-07-26 ENCOUNTER — Encounter: Payer: Self-pay | Admitting: Cardiology

## 2018-07-26 ENCOUNTER — Other Ambulatory Visit: Payer: Self-pay

## 2018-07-26 VITALS — BP 110/72 | HR 56 | Ht 68.0 in | Wt 121.8 lb

## 2018-07-26 DIAGNOSIS — I714 Abdominal aortic aneurysm, without rupture, unspecified: Secondary | ICD-10-CM

## 2018-07-26 DIAGNOSIS — I447 Left bundle-branch block, unspecified: Secondary | ICD-10-CM

## 2018-07-26 DIAGNOSIS — I5022 Chronic systolic (congestive) heart failure: Secondary | ICD-10-CM

## 2018-07-26 DIAGNOSIS — I4891 Unspecified atrial fibrillation: Secondary | ICD-10-CM

## 2018-07-26 MED ORDER — FUROSEMIDE 20 MG PO TABS: 20.0000 mg | ORAL_TABLET | Freq: Every day | ORAL | 6 refills | Status: DC | PRN

## 2018-07-26 MED ORDER — RIVAROXABAN 20 MG PO TABS: 20.0000 mg | ORAL_TABLET | Freq: Every day | ORAL | 3 refills | Status: DC

## 2018-07-26 MED ORDER — POTASSIUM CHLORIDE CRYS ER 20 MEQ PO TBCR: 20.0000 meq | EXTENDED_RELEASE_TABLET | Freq: Every day | ORAL | 6 refills | Status: DC | PRN

## 2018-07-26 MED ORDER — TRANDOLAPRIL 2 MG PO TABS: ORAL_TABLET | ORAL | 3 refills | Status: DC

## 2018-07-26 NOTE — Telephone Encounter (Signed)
REFILLS

## 2018-07-26 NOTE — Patient Instructions (Signed)
Continue your current theray  We will notify Dr [name] of clearance for hernia surgery  Follow up in 6 months

## 2018-08-02 ENCOUNTER — Ambulatory Visit: Payer: Self-pay | Admitting: General Surgery

## 2018-08-23 ENCOUNTER — Other Ambulatory Visit: Payer: Self-pay | Admitting: *Deleted

## 2018-08-23 DIAGNOSIS — Z124 Encounter for screening for malignant neoplasm of cervix: Secondary | ICD-10-CM | POA: Diagnosis not present

## 2018-08-23 DIAGNOSIS — Z681 Body mass index (BMI) 19 or less, adult: Secondary | ICD-10-CM | POA: Diagnosis not present

## 2018-08-23 DIAGNOSIS — N760 Acute vaginitis: Secondary | ICD-10-CM | POA: Diagnosis not present

## 2018-08-23 MED ORDER — RABEPRAZOLE SODIUM 20 MG PO TBEC: DELAYED_RELEASE_TABLET | ORAL | 1 refills | Status: DC

## 2018-08-23 NOTE — Telephone Encounter (Signed)
Patient requested refill

## 2018-08-24 ENCOUNTER — Ambulatory Visit: Payer: Medicare Other | Admitting: Internal Medicine

## 2018-09-04 ENCOUNTER — Encounter (HOSPITAL_COMMUNITY)
Admission: RE | Admit: 2018-09-04 | Discharge: 2018-09-04 | Disposition: A | Discharge status: Home / Self Care | Payer: Medicare Other | Source: Ambulatory Visit | Attending: General Surgery | Admitting: General Surgery

## 2018-09-04 ENCOUNTER — Other Ambulatory Visit: Payer: Self-pay

## 2018-09-04 ENCOUNTER — Other Ambulatory Visit (HOSPITAL_COMMUNITY): Payer: Medicare Other

## 2018-09-04 ENCOUNTER — Encounter (HOSPITAL_COMMUNITY): Payer: Self-pay

## 2018-09-04 DIAGNOSIS — J449 Chronic obstructive pulmonary disease, unspecified: Secondary | ICD-10-CM | POA: Diagnosis not present

## 2018-09-04 DIAGNOSIS — F329 Major depressive disorder, single episode, unspecified: Secondary | ICD-10-CM | POA: Diagnosis not present

## 2018-09-04 DIAGNOSIS — K432 Incisional hernia without obstruction or gangrene: Secondary | ICD-10-CM | POA: Diagnosis not present

## 2018-09-04 DIAGNOSIS — Z79890 Hormone replacement therapy (postmenopausal): Secondary | ICD-10-CM | POA: Diagnosis not present

## 2018-09-04 DIAGNOSIS — Z7901 Long term (current) use of anticoagulants: Secondary | ICD-10-CM | POA: Diagnosis not present

## 2018-09-04 DIAGNOSIS — E785 Hyperlipidemia, unspecified: Secondary | ICD-10-CM | POA: Diagnosis not present

## 2018-09-04 DIAGNOSIS — Z1159 Encounter for screening for other viral diseases: Secondary | ICD-10-CM | POA: Diagnosis not present

## 2018-09-04 DIAGNOSIS — I5022 Chronic systolic (congestive) heart failure: Secondary | ICD-10-CM | POA: Diagnosis not present

## 2018-09-04 DIAGNOSIS — I110 Hypertensive heart disease with heart failure: Secondary | ICD-10-CM | POA: Diagnosis not present

## 2018-09-04 DIAGNOSIS — E039 Hypothyroidism, unspecified: Secondary | ICD-10-CM | POA: Diagnosis not present

## 2018-09-04 DIAGNOSIS — Z87891 Personal history of nicotine dependence: Secondary | ICD-10-CM | POA: Diagnosis not present

## 2018-09-04 DIAGNOSIS — Z950 Presence of cardiac pacemaker: Secondary | ICD-10-CM | POA: Diagnosis not present

## 2018-09-04 HISTORY — DX: Abdominal aortic aneurysm, without rupture: I71.4

## 2018-09-04 HISTORY — DX: Abdominal aortic aneurysm, without rupture, unspecified: I71.40

## 2018-09-04 HISTORY — DX: Paroxysmal atrial fibrillation: I48.0

## 2018-09-04 HISTORY — DX: Pneumonia, unspecified organism: J18.9

## 2018-09-04 LAB — CBC
HCT: 33.8 % — ABNORMAL LOW (ref 36.0–46.0)
Hemoglobin: 10.8 g/dL — ABNORMAL LOW (ref 12.0–15.0)
MCH: 28.4 pg (ref 26.0–34.0)
MCHC: 32 g/dL (ref 30.0–36.0)
MCV: 88.9 fL (ref 80.0–100.0)
Platelets: 186 10*3/uL (ref 150–400)
RBC: 3.8 MIL/uL — ABNORMAL LOW (ref 3.87–5.11)
RDW: 14 % (ref 11.5–15.5)
WBC: 6.5 10*3/uL (ref 4.0–10.5)
nRBC: 0 % (ref 0.0–0.2)

## 2018-09-04 LAB — BASIC METABOLIC PANEL
Anion gap: 7 (ref 5–15)
BUN: 36 mg/dL — ABNORMAL HIGH (ref 8–23)
CO2: 24 mmol/L (ref 22–32)
Calcium: 9.5 mg/dL (ref 8.9–10.3)
Chloride: 105 mmol/L (ref 98–111)
Creatinine, Ser: 0.84 mg/dL (ref 0.44–1.00)
GFR calc Af Amer: >60 mL/min (ref >60)
GFR calc non Af Amer: >60 mL/min (ref >60)
Glucose, Bld: 96 mg/dL (ref 70–99)
Potassium: 4.7 mmol/L (ref 3.5–5.1)
Sodium: 136 mmol/L (ref 135–145)

## 2018-09-04 NOTE — Pre-Procedure Instructions (Signed)
PCP -Dr. Hollace Kinnier  Cardiologist - Dr. Peter [name] EP-Dr. Cristopher Peru  Chest x-ray - N/A EKG - 04/21/18 Stress Test - ? Pt couldn't remember an exact date; over 5 years  ECHO - 07/22/16 Cardiac Cath - 2009  Sleep Study - denies CPAP - denies  Blood Thinner Instructions: Hold Xarelto 48 hours prior. LD 09/04/18 Aspirin Instructions:N/A  Anesthesia review: Yes, heart history  Patient denies shortness of breath, fever, cough and chest pain at PAT appointment   Patient verbalized understanding of instructions that were given to them at the PAT appointment. Patient was also instructed that they will need to review over the PAT instructions again at home before surgery.  Covid testing 09/05/18. Pt understands quarantine rules and verbalizes understanding.    Coronavirus Screening  Have you experienced the following symptoms:  Cough yes/no: No Fever (>100.47F)  yes/no: No Runny nose yes/no: No Sore throat yes/no: No Difficulty breathing/shortness of breath  yes/no: No  Have you or a family member traveled in the last 14 days and where? yes/no: No   If the patient indicates "YES" to the above questions, their PAT will be rescheduled to limit the exposure to others and, the surgeon will be notified. THE PATIENT WILL NEED TO BE ASYMPTOMATIC FOR 14 DAYS.   If the patient is not experiencing any of these symptoms, the PAT nurse will instruct them to NOT bring anyone with them to their appointment since they may have these symptoms or traveled as well.   Please remind your patients and families that hospital visitation restrictions are in effect and the importance of the restrictions.

## 2018-09-04 NOTE — Pre-Procedure Instructions (Signed)
[id number] [address], [place] [address] Phone: [phone] Fax: [phone]  [facility] [place] [facility] [address] [place] [address] Phone: [phone] Fax: [phone]      Your procedure is scheduled on Thursday, July 2nd.  Report to [facility] Main Entrance "A" at 10:30 A.M., and check in at the Admitting office.  Call this number if you have problems the morning of surgery:  [phone]  Call [phone] if you have any questions prior to your surgery date Monday-Friday 8am-4pm    Remember:  Do not eat after midnight.  You may drink clear liquids until 9:30 A.M. (3 hours prior to procedure time) .   Clear liquids allowed are: Water, Non-Citrus Juices (without pulp), Carbonated Beverages, Clear Tea, Black Coffee Only, and Gatorade    Take these medicines the morning of surgery with A SIP OF WATER  acetaminophen (TYLENOL)-as needed for pain buPROPion (WELLBUTRIN XL)  fexofenadine (ALLEGRA)-as needed for allergies guaiFENesin (MUCINEX)-as needed  SYNTHROID  RABEprazole (ACIPHEX)  Per your Cardiologist, hold rivaroxaban (XARELTO) 48 hours (last dose 6/29) prior to surgery.   As of today, STOP taking any Aspirin (unless otherwise instructed by your surgeon), Aleve, Naproxen, Ibuprofen, Motrin, Advil, Goody's, BC's, all herbal medications, fish oil, and all vitamins.    The Morning of Surgery  Do not wear jewelry, make-up or nail polish.  Do not wear lotions, powders, or perfumes, or deodorant  Do not shave 48 hours prior to surgery.   Do not bring valuables to the hospital.  [facility] is not responsible for any belongings or valuables.  If you are a smoker, DO NOT Smoke 24 hours prior to surgery IF you wear a CPAP at night please bring your mask, tubing, and machine the morning of surgery   Remember that  you must have someone to transport you home after your surgery, and remain with you for 24 hours if you are discharged the same day.   Contacts, glasses, hearing aids, dentures or bridgework may not be worn into surgery.    Leave your suitcase in the car.  After surgery it may be brought to your room.  For patients admitted to the hospital, discharge time will be determined by your treatment team.  Patients discharged the day of surgery will not be allowed to drive home.    Special instructions:   [place]- Preparing For Surgery  Before surgery, you can play an important role. Because skin is not sterile, your skin needs to be as free of germs as possible. You can reduce the number of germs on your skin by washing with CHG (chlorahexidine gluconate) Soap before surgery.  CHG is an antiseptic cleaner which kills germs and bonds with the skin to continue killing germs even after washing.    Oral Hygiene is also important to reduce your risk of infection.  Remember - BRUSH YOUR TEETH THE MORNING OF SURGERY WITH YOUR REGULAR TOOTHPASTE  Please do not use if you have an allergy to CHG or antibacterial soaps. If your skin becomes reddened/irritated stop using the CHG.  Do not shave (including legs and underarms) for at least 48 hours prior to first CHG shower. It is OK to shave your face.  Please follow these instructions carefully.   1. Shower the NIGHT BEFORE SURGERY and the MORNING OF SURGERY with CHG Soap.  2. If you chose to wash your hair, wash your hair first as usual with your normal shampoo.  3. After you shampoo, rinse your hair and body thoroughly to remove the shampoo.  4. Use CHG as you would any other liquid soap. You can apply CHG directly to the skin and wash gently with a scrungie or a clean washcloth.   5. Apply the CHG Soap to your body ONLY FROM THE NECK DOWN.  Do not use on open wounds or open sores. Avoid contact with your eyes, ears, mouth and genitals (private  parts). Wash Face and genitals (private parts)  with your normal soap.   6. Wash thoroughly, paying special attention to the area where your surgery will be performed.  7. Thoroughly rinse your body with warm water from the neck down.  8. DO NOT shower/wash with your normal soap after using and rinsing off the CHG Soap.  9. Pat yourself dry with a CLEAN TOWEL.  10. Wear CLEAN PAJAMAS to bed the night before surgery, wear comfortable clothes the morning of surgery  11. Place CLEAN SHEETS on your bed the night of your first shower and DO NOT SLEEP WITH PETS.    Day of Surgery:  Do not apply any deodorants/lotions.  Please wear clean clothes to the hospital/surgery center.   Remember to brush your teeth WITH YOUR REGULAR TOOTHPASTE.   Please read over the following fact sheets that you were given.

## 2018-09-05 ENCOUNTER — Other Ambulatory Visit (HOSPITAL_COMMUNITY)
Admission: RE | Admit: 2018-09-05 | Discharge: 2018-09-05 | Disposition: A | Discharge status: Home / Self Care | Payer: Medicare Other | Source: Ambulatory Visit | Attending: General Surgery | Admitting: General Surgery

## 2018-09-05 ENCOUNTER — Encounter (HOSPITAL_COMMUNITY): Payer: Self-pay

## 2018-09-05 DIAGNOSIS — K432 Incisional hernia without obstruction or gangrene: Secondary | ICD-10-CM | POA: Diagnosis not present

## 2018-09-05 DIAGNOSIS — Z7901 Long term (current) use of anticoagulants: Secondary | ICD-10-CM | POA: Diagnosis not present

## 2018-09-05 DIAGNOSIS — Z1159 Encounter for screening for other viral diseases: Secondary | ICD-10-CM | POA: Diagnosis not present

## 2018-09-05 DIAGNOSIS — J449 Chronic obstructive pulmonary disease, unspecified: Secondary | ICD-10-CM | POA: Diagnosis not present

## 2018-09-05 DIAGNOSIS — Z87891 Personal history of nicotine dependence: Secondary | ICD-10-CM | POA: Diagnosis not present

## 2018-09-05 DIAGNOSIS — Z950 Presence of cardiac pacemaker: Secondary | ICD-10-CM | POA: Diagnosis not present

## 2018-09-05 LAB — SARS CORONAVIRUS 2 (TAT 6-24 HRS): SARS Coronavirus 2: NEGATIVE

## 2018-09-05 NOTE — Anesthesia Preprocedure Evaluation (Addendum)
Anesthesia Evaluation  Patient identified by MRN, date of birth, ID band Patient awake    Reviewed: Allergy & Precautions, NPO status , Patient's Chart, lab work & pertinent test results  Airway Mallampati: II  TM Distance: >3 FB Neck ROM: Full    Dental  (+) Teeth Intact, Dental Advisory Given   Pulmonary former smoker,    Pulmonary exam normal        Cardiovascular hypertension, Pt. on medications Normal cardiovascular exam+ pacemaker      Neuro/Psych PSYCHIATRIC DISORDERS Anxiety Depression negative neurological ROS     GI/Hepatic Neg liver ROS, GERD  Medicated and Controlled,  Endo/Other  Hypothyroidism   Renal/GU negative Renal ROS  negative genitourinary   Musculoskeletal   Abdominal   Peds  Hematology negative hematology ROS (+)   Anesthesia Other Findings   Reproductive/Obstetrics                            Anesthesia Physical Anesthesia Plan  ASA: III  Anesthesia Plan: General   Post-op Pain Management:    Induction: Intravenous  PONV Risk Score and Plan:   Airway Management Planned: Oral ETT  Additional Equipment:   Intra-op Plan:   Post-operative Plan: Extubation in OR  Informed Consent: I have reviewed the patients History and Physical, chart, labs and discussed the procedure including the risks, benefits and alternatives for the proposed anesthesia with the patient or authorized representative who has indicated his/her understanding and acceptance.     Dental advisory given  Plan Discussed with: CRNA, Anesthesiologist and Surgeon  Anesthesia Plan Comments: (PAT note written 09/05/2018 by Myra Gianotti, PA-C. )       Anesthesia Quick Evaluation

## 2018-09-05 NOTE — Progress Notes (Signed)
Anesthesia Chart Review:  Case: [id number] Date/Time: 09/07/18 1215   Procedure: LAPAROSCOPIC ASSISTED REPAIR OF INCISIONAL HERNIA WITH MESH (N/A )   Anesthesia type: General   Pre-op diagnosis: INCISIONAL HERNIA   Location: [facility] OR ROOM 02 / [facility] OR   Surgeon: Greer Pickerel, MD      DISCUSSION: Patient is an 85 year old female scheduled for the above procedure.   History includes former smoker (quit 1995), non-ischemic cardiomyopathy, chronic systolic CHF, implanted cardiac device (s/p BiV ICD 05/14/05 but removed and replaced with a [employer] BiV pacemaker 04/26/12), left BBB, palpitations, PAF (noted on PPM interrogation 11/2016), mitral regurgitation (mild, 07/2016), HLD, HTN, hypothyroidism, GERD, COPD, AAA (2.8 cm 05/2018), melanoma (s/p excision LUE with lymph node dissection 09/2002), small bowel obstruction (due to strangulated right femoral hernia, s/p hernia repair and small bowel resection 12/25/12), ventral hernia repair (10/01/13). She reports that she is slow to wake up from anesthesia. No current ETOH use.   She was last evaluated by cardiologist Dr. [name] on 07/26/18. He wrote, "She is clear for surgery from a cardiac standpoint. Will need to hold Xarelto 48 hours prior to surgery." Patient reported last dose 09/04/18. Last PPM interrogation 5/19/290. Patient is not pacer dependent per implanted cardiac device Rx.  Patient denies shortness of breath, fever, cough and chest pain at PAT appointment. She was advised to contact cardiology if any acute changes between now and surgery, but based on currently available information it is anticipated that she can proceed as planned. Presurgical COVID test is scheduled for 09/05/18.   VS: BP (!) (P) 114/57   Pulse (!) (P) 55   Resp (P) 18   Ht (P) 5\' 8"  (1.727 m)   Wt (P) 56.8 kg   SpO2 (P) 100%   BMI (P) 19.03 kg/m    PROVIDERS: Gayland Curry, DO is PCP  [name], Peter, MD is cardiologist Cristopher Peru, MD is EP cardiologist. Last  visit 04/21/18.    LABS: Labs reviewed: Acceptable for surgery. (all labs ordered are listed, but only abnormal results are displayed)  Labs Reviewed  BASIC METABOLIC PANEL - Abnormal; Notable for the following components:      Result Value   BUN 36 (*)    All other components within normal limits  CBC - Abnormal; Notable for the following components:   RBC 3.80 (*)    Hemoglobin 10.8 (*)    HCT 33.8 (*)    All other components within normal limits    IMAGES: CT abd/pelvis 05/09/18: IMPRESSION: - No acute findings in the abdomen/pelvis. - Small hernia over the anterior pelvic wall just left of midline containing a partial segment of small bowel unchanged. No bowel obstruction. - Stable ectasia of the infrarenal abdominal aorta measuring 2.8 cm in AP diameter. Ectatic abdominal aorta at risk for aneurysm development. Recommend followup by ultrasound in 5 years. This recommendation follows ACR consensus guidelines: White Paper of the ACR Incidental Findings Committee II on Vascular Findings. J Am Coll Radiol 2013; 10:789-794. - Aortic Atherosclerosis (ICD10-I70.0). Mild atherosclerotic coronary artery disease. - Moderate colonic diverticulosis. - Several subcentimeter renal cortical hypodensities too small to characterize but likely cysts.   EKG: 04/21/18: AV dual paced rhythm   CV: Echo 07/22/16: Study Conclusions - Left ventricle: The cavity size was mildly dilated. Wall   thickness was normal. Systolic function was normal. The estimated   ejection fraction was in the range of 50% to 55%. Wall motion was   normal; there were no  regional wall motion abnormalities. - Aortic valve: There was trivial regurgitation. - Mitral valve: There was mild regurgitation. - Atrial septum: No defect or patent foramen ovale was identified.  Cardiac cath 07/02/04 IMPRESSION/RECOMMENDATION:  Patient has severe dilated cardiomyopathy with severe mitral regurgitation.  EF 20%. There is no  coronary artery disease. Abstinence from alcohol is advised.   Past Medical History:  Diagnosis Date  . AAA (abdominal aortic aneurysm) (HCC)    2.8 cm 05/2018; 5 year [place] per ACR guidelines  . Allergy    all year   . Anxiety disorder   . Arthritis   . Barrett's esophagus 04/2008  . Benign neoplasm of colon   . Blood transfusion without reported diagnosis    long ago per pt  . Cataract    removed both eyes  . CHF (congestive heart failure) ([place])   . Chronic airway obstruction, not elsewhere classified   . Complication of anesthesia    slow to wake up  . Depressive disorder, not elsewhere classified   . Diverticulitis   . Dysthymic disorder   . Erosive gastritis 08/2004  . GERD (gastroesophageal reflux disease)   . Hair thinning   . HTN (hypertension)    pt denies   . Hyperplastic polyps of stomach 06/2002  . Hypothyroidism   . ICD (implantable cardiac defibrillator) in place    BiV/ICD; s/p removal of ICD with insertion of BiV PPM 04/26/12  . Insomnia 08/20/2015  . Kyphosis 08/20/2015  . LBBB (left bundle branch block)   . Melanoma ([place])    right arm  . Mitral regurgitation   . Nonischemic cardiomyopathy ([place])    EF initially 20%; Last measurement up to 50% per echo in August of 2012  . Osteopenia   . Other and unspecified hyperlipidemia   . Other and unspecified hyperlipidemia   . Pacemaker 04/26/2012  . PAF (paroxysmal atrial fibrillation) ([place])    11/2016  . Pain in joint, lower leg   . Palpitations   . Pneumonia   . Restless legs syndrome (RLS)   . Synovial cyst of popliteal space   . Unspecified chronic bronchitis ([place])   . Unspecified nasal polyp   . Unspecified sinusitis (chronic)     Past Surgical History:  Procedure Laterality Date  . ABDOMINAL HYSTERECTOMY  1076   endometriosis  . APPENDECTOMY    . BI-VENTRICULAR PACEMAKER INSERTION N/A 04/26/2012   Procedure: BI-VENTRICULAR PACEMAKER INSERTION (CRT-P);  Surgeon: Evans Lance, MD;  [facility]: [facility] CATH  LAB;  Service: Cardiovascular;  Laterality: N/A;  . COLONOSCOPY    . defibrillator insertion     s/p removal of previously implanted BiV ICD and insertion of a new BiV pacemaker on 04/26/12  . excise mole of lip  2001   Dr. Larena Sox  . excision of wen  1984   Dr. Parks Ranger  . FEMORAL HERNIA REPAIR  12/25/2012   Dr Dalbert Batman  . FEMORAL HERNIA REPAIR  01/04/2013   Recurrent - Dr. Lilyan Punt  . INCISIONAL HERNIA REPAIR N/A 10/01/2013   Procedure: LAPAROSCOPIC INCISIONAL HERNIA;  Surgeon: Gayland Curry, MD;  Location: [facility]S;  Service: General;  Laterality: N/A;  . INGUINAL HERNIA REPAIR Right 09/26/2012   Procedure: RIGHT INGUINAL HERNIA REPAIR WITH MESH;  Surgeon: Odis Hollingshead, MD;  Location: [facility];  Service: General;  Laterality: Right;  . INGUINAL HERNIA REPAIR Right 12/25/2012   Procedure: explor right groin, small bowel rescection, tissue repair right femoral hernia;  Surgeon: Adin Hector, MD;  Location: [facility]S;  Service: General;  Laterality: Right;  . INSERTION OF MESH Right 09/26/2012   Procedure: INSERTION OF MESH;  Surgeon: Odis Hollingshead, MD;  Location: [facility];  Service: General;  Laterality: Right;  . INSERTION OF MESH N/A 10/01/2013   Procedure: INSERTION OF MESH;  Surgeon: Gayland Curry, MD;  Location: [facility];  Service: General;  Laterality: N/A;  . LAPAROSCOPY N/A 01/04/2013   Procedure: Diagnostic Laparoscopy, exploratory laparotomy with small bowel resection, closure of right femoral hernia repair;  Surgeon: Madilyn Hook, DO;  Location: [facility];  Service: General;  Laterality: N/A;  . Left arm surgery    . POLYPECTOMY    . RIGHT BREAST LUMPECTOMY  1988   Dr. Marylene Buerger  . right knee surgery     arthroscopy: Dr. Shellia Carwin  . TONSILLECTOMY    . UPPER GASTROINTESTINAL ENDOSCOPY      MEDICATIONS: . acetaminophen (TYLENOL) 500 MG tablet  . Ascorbic Acid (VITAMIN C) 1000 MG tablet  . Biotin 5000 MCG TABS  . buPROPion (WELLBUTRIN XL) 150 MG 24 hr tablet  . Calcium  Carbonate-Vitamin D (CALCIUM + D PO)  . Cyanocobalamin (VITAMIN B 12) 500 MCG TABS  . estradiol (ESTRACE) 0.1 MG/GM vaginal cream  . fexofenadine (ALLEGRA) 60 MG tablet  . furosemide (LASIX) 20 MG tablet  . guaiFENesin (MUCINEX) 600 MG 12 hr tablet  . Magnesium 250 MG TABS  . Multiple Vitamin (MULTIVITAMIN WITH MINERALS) TABS  . potassium chloride SA (K-DUR) 20 MEQ tablet  . RABEprazole (ACIPHEX) 20 MG tablet  . rivaroxaban (XARELTO) 20 MG TABS tablet  . SYNTHROID 100 MCG tablet  . trandolapril (MAVIK) 2 MG tablet   . 0.9 %  sodium chloride infusion    Myra Gianotti, PA-C Surgical Short Stay/Anesthesiology [facility] Phone [phone] [facility] Phone [phone] 09/05/2018 11:04 AM

## 2018-09-06 MED ORDER — BUPIVACAINE LIPOSOME 1.3 % IJ SUSP
20.0000 mL | Freq: Once | INTRAMUSCULAR | Status: DC
  Filled 2018-09-06: qty 20

## 2018-09-07 ENCOUNTER — Observation Stay (HOSPITAL_COMMUNITY)
Admission: RE | Admit: 2018-09-07 | Discharge: 2018-09-08 | Disposition: A | Discharge status: Home / Self Care | Payer: Medicare Other | Attending: General Surgery | Admitting: General Surgery

## 2018-09-07 ENCOUNTER — Ambulatory Visit (HOSPITAL_COMMUNITY): Payer: Medicare Other | Admitting: Anesthesiology

## 2018-09-07 ENCOUNTER — Other Ambulatory Visit: Payer: Self-pay

## 2018-09-07 ENCOUNTER — Ambulatory Visit (HOSPITAL_COMMUNITY): Payer: Medicare Other | Admitting: Vascular Surgery

## 2018-09-07 ENCOUNTER — Encounter (HOSPITAL_COMMUNITY): Payer: Self-pay | Admitting: Anesthesiology

## 2018-09-07 ENCOUNTER — Encounter (HOSPITAL_COMMUNITY)
Admission: RE | Disposition: A | Discharge status: Home / Self Care | Payer: Self-pay | Source: Home / Self Care | Attending: General Surgery

## 2018-09-07 DIAGNOSIS — Z7901 Long term (current) use of anticoagulants: Secondary | ICD-10-CM | POA: Insufficient documentation

## 2018-09-07 DIAGNOSIS — E785 Hyperlipidemia, unspecified: Secondary | ICD-10-CM | POA: Insufficient documentation

## 2018-09-07 DIAGNOSIS — Z79890 Hormone replacement therapy (postmenopausal): Secondary | ICD-10-CM | POA: Diagnosis not present

## 2018-09-07 DIAGNOSIS — I10 Essential (primary) hypertension: Secondary | ICD-10-CM | POA: Diagnosis not present

## 2018-09-07 DIAGNOSIS — K432 Incisional hernia without obstruction or gangrene: Secondary | ICD-10-CM | POA: Diagnosis not present

## 2018-09-07 DIAGNOSIS — Z950 Presence of cardiac pacemaker: Secondary | ICD-10-CM | POA: Diagnosis not present

## 2018-09-07 DIAGNOSIS — I110 Hypertensive heart disease with heart failure: Secondary | ICD-10-CM | POA: Insufficient documentation

## 2018-09-07 DIAGNOSIS — F329 Major depressive disorder, single episode, unspecified: Secondary | ICD-10-CM | POA: Insufficient documentation

## 2018-09-07 DIAGNOSIS — I5022 Chronic systolic (congestive) heart failure: Secondary | ICD-10-CM | POA: Insufficient documentation

## 2018-09-07 DIAGNOSIS — J449 Chronic obstructive pulmonary disease, unspecified: Secondary | ICD-10-CM | POA: Diagnosis not present

## 2018-09-07 DIAGNOSIS — Z87891 Personal history of nicotine dependence: Secondary | ICD-10-CM | POA: Insufficient documentation

## 2018-09-07 DIAGNOSIS — E039 Hypothyroidism, unspecified: Secondary | ICD-10-CM | POA: Diagnosis not present

## 2018-09-07 DIAGNOSIS — Z1159 Encounter for screening for other viral diseases: Secondary | ICD-10-CM | POA: Diagnosis not present

## 2018-09-07 HISTORY — DX: Incisional hernia without obstruction or gangrene: K43.2

## 2018-09-07 HISTORY — PX: INCISIONAL HERNIA REPAIR: SHX193

## 2018-09-07 LAB — PROTIME-INR
INR: 1 (ref 0.8–1.2)
Prothrombin Time: 12.9 seconds (ref 11.4–15.2)

## 2018-09-07 SURGERY — REPAIR, HERNIA, INCISIONAL, LAPAROSCOPIC
Anesthesia: General | Site: Abdomen

## 2018-09-07 MED ORDER — LIDOCAINE 2% (20 MG/ML) 5 ML SYRINGE
INTRAMUSCULAR | Status: AC
  Filled 2018-09-07: qty 5

## 2018-09-07 MED ORDER — MIDAZOLAM HCL 2 MG/2ML IJ SOLN
INTRAMUSCULAR | Status: AC
  Filled 2018-09-07: qty 2

## 2018-09-07 MED ORDER — GABAPENTIN 300 MG PO CAPS
300.0000 mg | ORAL_CAPSULE | ORAL | Status: AC
  Administered 2018-09-07: 11:00:00 300 mg via ORAL

## 2018-09-07 MED ORDER — DEXAMETHASONE SODIUM PHOSPHATE 10 MG/ML IJ SOLN
INTRAMUSCULAR | Status: AC
  Filled 2018-09-07: qty 1

## 2018-09-07 MED ORDER — SIMETHICONE 80 MG PO CHEW: 40.0000 mg | CHEWABLE_TABLET | Freq: Four times a day (QID) | ORAL | Status: DC | PRN

## 2018-09-07 MED ORDER — PROPOFOL 10 MG/ML IV BOLUS
INTRAVENOUS | Status: AC
  Filled 2018-09-07: qty 20

## 2018-09-07 MED ORDER — ROCURONIUM BROMIDE 10 MG/ML (PF) SYRINGE
PREFILLED_SYRINGE | INTRAVENOUS | Status: AC
  Filled 2018-09-07: qty 10

## 2018-09-07 MED ORDER — CHLORHEXIDINE GLUCONATE 4 % EX LIQD: 60.0000 mL | Freq: Once | CUTANEOUS | Status: DC

## 2018-09-07 MED ORDER — KETOROLAC TROMETHAMINE 15 MG/ML IJ SOLN: 15.0000 mg | Freq: Three times a day (TID) | INTRAMUSCULAR | Status: DC | PRN

## 2018-09-07 MED ORDER — ENOXAPARIN SODIUM 30 MG/0.3ML ~~LOC~~ SOLN
30.0000 mg | SUBCUTANEOUS | Status: DC
  Administered 2018-09-08: 08:00:00 30 mg via SUBCUTANEOUS
  Filled 2018-09-07: qty 0.3

## 2018-09-07 MED ORDER — LEVOTHYROXINE SODIUM 100 MCG PO TABS
100.0000 ug | ORAL_TABLET | Freq: Every day | ORAL | Status: DC
  Administered 2018-09-08: 100 ug via ORAL
  Filled 2018-09-07: qty 1

## 2018-09-07 MED ORDER — FENTANYL CITRATE (PF) 100 MCG/2ML IJ SOLN
INTRAMUSCULAR | Status: DC | PRN
  Administered 2018-09-07: 25 ug via INTRAVENOUS
  Administered 2018-09-07: 150 ug via INTRAVENOUS

## 2018-09-07 MED ORDER — LIDOCAINE 2% (20 MG/ML) 5 ML SYRINGE
INTRAMUSCULAR | Status: DC | PRN
  Administered 2018-09-07: 100 mg via INTRAVENOUS

## 2018-09-07 MED ORDER — GUAIFENESIN ER 600 MG PO TB12: 600.0000 mg | ORAL_TABLET | Freq: Every day | ORAL | Status: DC | PRN

## 2018-09-07 MED ORDER — LACTATED RINGERS IV SOLN
INTRAVENOUS | Status: DC
  Administered 2018-09-07: 11:00:00 via INTRAVENOUS

## 2018-09-07 MED ORDER — BUPIVACAINE-EPINEPHRINE (PF) 0.5% -1:200000 IJ SOLN
INTRAMUSCULAR | Status: AC
  Filled 2018-09-07: qty 30

## 2018-09-07 MED ORDER — DOCUSATE SODIUM 100 MG PO CAPS
100.0000 mg | ORAL_CAPSULE | Freq: Two times a day (BID) | ORAL | Status: DC
  Administered 2018-09-07 – 2018-09-08 (×2): 100 mg via ORAL
  Filled 2018-09-07 (×2): qty 1

## 2018-09-07 MED ORDER — TRANDOLAPRIL 2 MG PO TABS
2.0000 mg | ORAL_TABLET | Freq: Two times a day (BID) | ORAL | Status: DC
  Administered 2018-09-07 – 2018-09-08 (×2): 2 mg via ORAL
  Filled 2018-09-07 (×2): qty 1

## 2018-09-07 MED ORDER — BUPIVACAINE-EPINEPHRINE 0.5% -1:200000 IJ SOLN
INTRAMUSCULAR | Status: DC | PRN
  Administered 2018-09-07: 30 mL

## 2018-09-07 MED ORDER — FENTANYL CITRATE (PF) 250 MCG/5ML IJ SOLN
INTRAMUSCULAR | Status: AC
  Filled 2018-09-07: qty 5

## 2018-09-07 MED ORDER — ONDANSETRON HCL 4 MG/2ML IJ SOLN
INTRAMUSCULAR | Status: DC | PRN
  Administered 2018-09-07: 4 mg via INTRAVENOUS

## 2018-09-07 MED ORDER — ONDANSETRON 4 MG PO TBDP: 4.0000 mg | ORAL_TABLET | Freq: Four times a day (QID) | ORAL | Status: DC | PRN

## 2018-09-07 MED ORDER — ROCURONIUM BROMIDE 10 MG/ML (PF) SYRINGE
PREFILLED_SYRINGE | INTRAVENOUS | Status: DC | PRN
  Administered 2018-09-07: 55 mg via INTRAVENOUS

## 2018-09-07 MED ORDER — ACETAMINOPHEN 500 MG PO TABS
1000.0000 mg | ORAL_TABLET | Freq: Four times a day (QID) | ORAL | Status: DC
  Administered 2018-09-07 – 2018-09-08 (×3): 1000 mg via ORAL
  Filled 2018-09-07 (×3): qty 2

## 2018-09-07 MED ORDER — ACETAMINOPHEN 500 MG PO TABS
ORAL_TABLET | ORAL | Status: AC
  Administered 2018-09-07: 11:00:00 1000 mg via ORAL
  Filled 2018-09-07: qty 2

## 2018-09-07 MED ORDER — GABAPENTIN 300 MG PO CAPS
ORAL_CAPSULE | ORAL | Status: AC
  Administered 2018-09-07: 11:00:00 300 mg via ORAL
  Filled 2018-09-07: qty 1

## 2018-09-07 MED ORDER — SUGAMMADEX SODIUM 200 MG/2ML IV SOLN
INTRAVENOUS | Status: DC | PRN
  Administered 2018-09-07: 125 mg via INTRAVENOUS

## 2018-09-07 MED ORDER — ACETAMINOPHEN 500 MG PO TABS
1000.0000 mg | ORAL_TABLET | ORAL | Status: AC
  Administered 2018-09-07: 11:00:00 1000 mg via ORAL

## 2018-09-07 MED ORDER — LORATADINE 10 MG PO TABS
10.0000 mg | ORAL_TABLET | Freq: Every day | ORAL | Status: DC
  Filled 2018-09-07: qty 1

## 2018-09-07 MED ORDER — VANCOMYCIN HCL IN DEXTROSE 1-5 GM/200ML-% IV SOLN
INTRAVENOUS | Status: AC
  Administered 2018-09-07: 11:00:00 1000 mg via INTRAVENOUS
  Filled 2018-09-07: qty 200

## 2018-09-07 MED ORDER — DEXAMETHASONE SODIUM PHOSPHATE 4 MG/ML IJ SOLN
INTRAMUSCULAR | Status: DC | PRN
  Administered 2018-09-07: 4 mg via INTRAVENOUS

## 2018-09-07 MED ORDER — POTASSIUM CHLORIDE IN NACL 20-0.9 MEQ/L-% IV SOLN
INTRAVENOUS | Status: DC
  Administered 2018-09-07: via INTRAVENOUS
  Filled 2018-09-07: qty 1000

## 2018-09-07 MED ORDER — PANTOPRAZOLE SODIUM 40 MG PO TBEC
40.0000 mg | DELAYED_RELEASE_TABLET | Freq: Every day | ORAL | Status: DC
  Administered 2018-09-08: 10:00:00 40 mg via ORAL
  Filled 2018-09-07: qty 1

## 2018-09-07 MED ORDER — MIDAZOLAM HCL 5 MG/5ML IJ SOLN
INTRAMUSCULAR | Status: DC | PRN
  Administered 2018-09-07 (×2): 1 mg via INTRAVENOUS

## 2018-09-07 MED ORDER — BUPIVACAINE LIPOSOME 1.3 % IJ SUSP
INTRAMUSCULAR | Status: DC | PRN
  Administered 2018-09-07: 20 mL

## 2018-09-07 MED ORDER — DIPHENHYDRAMINE HCL 50 MG/ML IJ SOLN: 12.5000 mg | Freq: Four times a day (QID) | INTRAMUSCULAR | Status: DC | PRN

## 2018-09-07 MED ORDER — DIPHENHYDRAMINE HCL 12.5 MG/5ML PO ELIX: 12.5000 mg | ORAL_SOLUTION | Freq: Four times a day (QID) | ORAL | Status: DC | PRN

## 2018-09-07 MED ORDER — FENTANYL CITRATE (PF) 100 MCG/2ML IJ SOLN: 25.0000 ug | INTRAMUSCULAR | Status: DC | PRN

## 2018-09-07 MED ORDER — GABAPENTIN 100 MG PO CAPS
200.0000 mg | ORAL_CAPSULE | Freq: Two times a day (BID) | ORAL | Status: DC
  Administered 2018-09-07 – 2018-09-08 (×2): 200 mg via ORAL
  Filled 2018-09-07 (×2): qty 2

## 2018-09-07 MED ORDER — VANCOMYCIN HCL IN DEXTROSE 1-5 GM/200ML-% IV SOLN
1000.0000 mg | INTRAVENOUS | Status: AC
  Administered 2018-09-07: 11:00:00 1000 mg via INTRAVENOUS

## 2018-09-07 MED ORDER — ONDANSETRON HCL 4 MG/2ML IJ SOLN
INTRAMUSCULAR | Status: AC
  Filled 2018-09-07: qty 2

## 2018-09-07 MED ORDER — 0.9 % SODIUM CHLORIDE (POUR BTL) OPTIME
TOPICAL | Status: DC | PRN
  Administered 2018-09-07: 1000 mL

## 2018-09-07 MED ORDER — TRAMADOL HCL 50 MG PO TABS: 50.0000 mg | ORAL_TABLET | Freq: Four times a day (QID) | ORAL | Status: DC | PRN

## 2018-09-07 MED ORDER — PROPOFOL 10 MG/ML IV BOLUS
INTRAVENOUS | Status: DC | PRN
  Administered 2018-09-07: 30 mg via INTRAVENOUS
  Administered 2018-09-07: 70 mg via INTRAVENOUS

## 2018-09-07 MED ORDER — ONDANSETRON HCL 4 MG/2ML IJ SOLN: 4.0000 mg | Freq: Four times a day (QID) | INTRAMUSCULAR | Status: DC | PRN

## 2018-09-07 SURGICAL SUPPLY — 41 items
APPLIER CLIP LOGIC TI 5 (MISCELLANEOUS) IMPLANT
BLADE CLIPPER SURG (BLADE) IMPLANT
CANISTER SUCT 3000ML PPV (MISCELLANEOUS) IMPLANT
CHLORAPREP W/TINT 26 (MISCELLANEOUS) ×2 IMPLANT
COVER SURGICAL LIGHT HANDLE (MISCELLANEOUS) ×2 IMPLANT
COVER WAND RF STERILE (DRAPES) ×2 IMPLANT
DERMABOND ADVANCED (GAUZE/BANDAGES/DRESSINGS) ×1
DERMABOND ADVANCED .7 DNX12 (GAUZE/BANDAGES/DRESSINGS) ×1 IMPLANT
DEVICE SECURE STRAP 25 ABSORB (INSTRUMENTS) ×2 IMPLANT
DEVICE TROCAR PUNCTURE CLOSURE (ENDOMECHANICALS) ×2 IMPLANT
ELECT REM PT RETURN 9FT ADLT (ELECTROSURGICAL) ×2
ELECTRODE REM PT RTRN 9FT ADLT (ELECTROSURGICAL) ×1 IMPLANT
GLOVE BIOGEL M STRL SZ7.5 (GLOVE) ×2 IMPLANT
GLOVE INDICATOR 8.0 STRL GRN (GLOVE) ×2 IMPLANT
GOWN STRL REUS W/ TWL LRG LVL3 (GOWN DISPOSABLE) ×2 IMPLANT
GOWN STRL REUS W/TWL 2XL LVL3 (GOWN DISPOSABLE) ×2 IMPLANT
GOWN STRL REUS W/TWL LRG LVL3 (GOWN DISPOSABLE) ×2
KIT BASIN OR (CUSTOM PROCEDURE TRAY) ×2 IMPLANT
KIT TURNOVER KIT B (KITS) ×2 IMPLANT
MARKER SKIN DUAL TIP RULER LAB (MISCELLANEOUS) ×2 IMPLANT
NDL SPNL 22GX3.5 QUINCKE BK (NEEDLE) ×1 IMPLANT
NEEDLE SPNL 22GX3.5 QUINCKE BK (NEEDLE) ×2 IMPLANT
NS IRRIG 1000ML POUR BTL (IV SOLUTION) ×2 IMPLANT
PAD ARMBOARD 7.5X6 YLW CONV (MISCELLANEOUS) ×4 IMPLANT
PENCIL SMOKE EVACUATOR (MISCELLANEOUS) ×2 IMPLANT
SCISSORS LAP 5X35 DISP (ENDOMECHANICALS) ×2 IMPLANT
SET IRRIG TUBING LAPAROSCOPIC (IRRIGATION / IRRIGATOR) IMPLANT
SET TUBE SMOKE EVAC HIGH FLOW (TUBING) ×2 IMPLANT
SHEARS HARMONIC ACE PLUS 36CM (ENDOMECHANICALS) IMPLANT
SLEEVE ENDOPATH XCEL 5M (ENDOMECHANICALS) ×4 IMPLANT
SUT MNCRL AB 4-0 PS2 18 (SUTURE) ×2 IMPLANT
SUT NOVA NAB DX-16 0-1 5-0 T12 (SUTURE) ×2 IMPLANT
SUT VIC AB 3-0 SH 18 (SUTURE) ×2 IMPLANT
TOWEL GREEN STERILE (TOWEL DISPOSABLE) ×2 IMPLANT
TOWEL GREEN STERILE FF (TOWEL DISPOSABLE) ×2 IMPLANT
TRAY FOLEY CATH SILVER 16FR (SET/KITS/TRAYS/PACK) IMPLANT
TRAY LAPAROSCOPIC MC (CUSTOM PROCEDURE TRAY) ×2 IMPLANT
TROCAR XCEL BLUNT TIP 100MML (ENDOMECHANICALS) IMPLANT
TROCAR XCEL NON-BLD 11X100MML (ENDOMECHANICALS) IMPLANT
TROCAR XCEL NON-BLD 5MMX100MML (ENDOMECHANICALS) ×2 IMPLANT
WATER STERILE IRR 1000ML POUR (IV SOLUTION) ×2 IMPLANT

## 2018-09-07 NOTE — Brief Op Note (Signed)
09/07/2018  2:16 PM  PATIENT:  [name]  85 y.o. female  PRE-OPERATIVE DIAGNOSIS:  INCISIONAL HERNIA  POST-OPERATIVE DIAGNOSIS:  Left lower quadrant INCISIONAL HERNIA  PROCEDURE:  Procedure(s): LAPAROSCOPIC ASSISTED REPAIR OF INCISIONAL HERNIA (N/A)  SURGEON:  Surgeon(s) and Role:    Greer Pickerel, MD - Primary  PHYSICIAN ASSISTANT:   ASSISTANTS: none   ANESTHESIA:   general  EBL:  20 mL   BLOOD ADMINISTERED:none  DRAINS: none   LOCAL MEDICATIONS USED:  MARCAINE    and OTHER exparel  SPECIMEN:  No Specimen  DISPOSITION OF SPECIMEN:  N/A  COUNTS:  YES  TOURNIQUET:  * No tourniquets in log *  DICTATION: .Other Dictation: Dictation Number [id number]  PLAN OF CARE: Admit for overnight observation  PATIENT DISPOSITION:  PACU - hemodynamically stable.   Delay start of Pharmacological VTE agent (>24hrs) due to surgical blood loss or risk of bleeding: no  [name]. [name], MD, FACS General, Bariatric, & Minimally Invasive Surgery [facility] Surgery, [place]

## 2018-09-07 NOTE — Op Note (Signed)
NAMEMARSHELLE, BILGER MEDICAL RECORD [id number] ACCOUNT [id number] DATE OF BIRTH:1932-10-24 FACILITY: [facility] LOCATION: [facility] PHYSICIAN:Hellon Vaccarella Ronnie Derby, MD  OPERATIVE REPORT  DATE OF PROCEDURE:  09/07/2018  PREOPERATIVE DIAGNOSIS:  Incisional hernia.  POSTOPERATIVE DIAGNOSIS:  Incisional hernia.  PROCEDURE:  Laparoscopic repair of incisional hernia.  SURGEON:  [name]. [name], MD  ASSISTANT:  None.  ANESTHESIA:  General plus bilateral TAP block with Exparel and Marcaine.  ESTIMATED BLOOD LOSS:  Minimal.  FINDINGS:  The patient had prior mesh from her prior laparoscopic incisional hernia repair in 2015.  In the left lower quadrant in the suprapubic area, there was a fascial defect.  One of the sutures had pulled apart from the abdominal wall resulting in  a fascial defect, which was partially covered by the mesh.  However, inferiorly there was a gap in the fascia, which was allowing the bulge when the patient was standing up, causing the hernia.  Because of her age and small bowel adhesions and colon  adhesions in and around the midabdomen and left midabdomen to the mesh, I decided just to do a primary muscle repair using 5 interrupted Novafil sutures placed laparoscopically.  INDICATIONS:  The patient is a pleasant 85 year old female who has a complicated past surgical history.  She initially had an open repair of an inguinal hernia on the right side in July 2014.  She then presented with an incarcerated right femoral hernia  in October 2014.  She underwent groin exploration and had strangulated right femoral hernia and underwent small bowel resection with primary anastomosis and a primary tissue repair.  She was then reexplored through a midline incision on 01/04/2013 and  underwent another small bowel resection with primary tissue repair of a right femoral hernia.  She presented in 2015 with a midline incisional hernia.  I took her to the operating room, and she had a fascial defect  in and around the lower midline, but  she had a significant diastasis of the lower midline.  A 15 cm round piece of mesh was used with Ventralight ST.  Over the past year, she has been coming back to the clinic complaining of a bulge in her left lower quadrant, slightly to the left of the  midline in the suprapubic location.  It was consistent with a small fascial defect.  CT scan confirmed this.  I saw the patient several times discussing options.  Please see outside medical records for those discussions.  The patient was concerned about  possible incarceration or strangulation, so she desired surgical repair.  DESCRIPTION OF PROCEDURE:  After obtaining informed consent, the patient was taken to OR 2 at [facility] and placed supine on the operating room table.  General endotracheal anesthesia was established.  Sequential compression devices were  placed.  A Foley catheter was placed.  Her arms were tucked at her side with the appropriate padding.  She received preoperative Tylenol and gabapentin in the holding area.  A surgical timeout was performed.  IV antibiotic had been administered.  I decided to gain access to the abdomen in the left upper quadrant using Optiview technique. In her previous surgery, That area was clear of adhesions.  A small incision was made just below the left subcostal margin.  Then, using a 0 degree 5 mm  laparoscope through a 5 mm trocar, advanced through all layers of the abdominal wall and entered the abdominal cavity.  Pneumoperitoneum was smoothly established up to a patient pressure of 15 mmHg without any  change in the patient's vital signs.  The  laparoscope was advanced and abdominal cavity was surveilled.  There were portions of her colon adhered to the midline, not densely adhered, and then there was some evidence of small bowel adhesions to the anterior abdominal wall in the lower midline and  left lower quadrant to the mesh and to the abdominal wall.  Upon  further inspection, there was a gap in the fascia in the left lower quadrant in the suprapubic location to the left of the midline corresponding to the area of the bulge which was  confirmed and outlined preoperatively in the holding area.  I decided to place my working trocars on the right side of the abdomen.  Three 5 mm trocars were placed in the right lateral abdominal wall, all under direct visualization.  The camera assistant  and I then switched to the right side of the abdomen.  I took down some of the omental adhesions and some of the minor colon adhesions in the lower midline to the anterior wall where the mesh was lying in order to get a better look at the left lower  quadrant fascial defect.  I also took down some small bowel adhesions in and around this area to get a working space.  I did not take down all the abdominal wall adhesions.  I just cleared enough to have working room around the area of concern.  The  fascial defect was partially covered by mesh.  However, there was an area of muscle that was separated.  The abdominal wall muscle was attenuated in the entire lower midline.  I decided to do a primary muscle repair because the main goal of the procedure  was to prevent possible incarceration in the future.  A small incision was made in the left lower quadrant.  Then, using an Endo Close with a #1 Novafil, I placed 5 interrupted sutures through separate small skin incisions.  Once the 5 interrupted  Novafil sutures were placed in a vertical fashion, pneumoperitoneum was slightly released.  I tied down each of the sutures, thus obliterating the fascial defect.  I decided not to place mesh because I did not think I would have enough room for overlap.   Then, using an Exparel-Marcaine mixture, I infiltrated a bilateral TAP block on each side of the abdominal wall using laparoscopic guidance as well as in and around the primary muscle repair.  There was a small bowel interloop adhesion where  I felt it  could be a source for potential internal hernia down the line, so this was lysed with EndoShears with electrocautery.  I did use EndoShears for lysing the anterior abdominal wall adhesions that I took down described above.  There was no evidence of  injury to surrounding structures.  I reinspected the Optiview site.  Pneumoperitoneum was released.  All trocars were removed.  Skin incisions were closed with a 4-0 Monocryl.  The 4 trocar sites and the 5 separate small skin nicks were sealed with  Dermabond.  The catheter was removed.  All needle, instrument and sponge counts were correct x2.  There were no immediate complications.  The patient tolerated the procedure well.  LN/NUANCE  D:09/07/2018 T:09/07/2018 JOB:[id number]

## 2018-09-07 NOTE — H&P (Signed)
[name] Documented: 07/12/2018 8:54 AM Location: [facility] Patient #: [id number] DOB: 1932-08-03 Single / Language: Cleophus Molt / Race: White Female   History of Present Illness Randall Hiss M. Anzleigh Slaven MD; 07/12/2018 9:16 AM) The patient is a 85 year old female who presents with an incisional hernia. She comes in again with concerns regarding her left lower midline incisional hernia. I last saw her in February. She states that after I reduced it into clinic in February she had a few days of discomfort in that area. She states about a week ago after eating breakfast she developed some pain in that location that radiated up her left abdomen to her side. She had no nausea or vomiting. After laying down the bulge went away. For the past several months the bulge has been going away with laying down. She denies any fevers or chills. She denies any diarrhea or constipation. She has an upcoming cardiology appointment in the middle the month. She states that she will need her pacemaker exchanged in the summer. She denies any chest pain, chest pressure, shortness of breath, dyspnea on exertion. She is very anxious lady. She also states that the lump in the left groin she feels a little bit bigger  04/2018 She comes in with concerns regarding her lower midline incisional hernia. I last saw her in November. She states that she has not had any problems however week before last she had an area of firmness in the lower midline just of the left around her pubic bone. It was tender. She had no change in GI function. No nausea, vomiting, diarrhea or constipation. There is still little bit sore.   01/19/2018 She comes in with concerns for enlarging lower midline incisional hernia as well as concerns for a lump in her left groin. She also complains of following 3 weeks ago and has concerns about her lateral left ankle. She asked me to look at it. She feels that the knot in the lower midline is  increasing at certain times. She denies any nausea, vomiting, diarrhea or constipation. She denies any fever or chills. She denies any drainage from the ankle area. She is on a blood thinner.   Problem List/Past Medical Randall Hiss M. [name], MD; 07/12/2018 9:22 AM) INCISIONAL HERNIA, WITHOUT OBSTRUCTION OR GANGRENE (K43.2)  LOWER ABDOMINAL PAIN (R10.30)  PACEMAKER (Z95.0)  Ventral Hernia Repair  [10/01/2013]: lap repair of incisional hernia with mesh (15cm round ventralight ST)  Past Surgical History Randall Hiss M. [name], MD; 07/12/2018 9:22 AM) Appendectomy  Cataract Surgery  Bilateral. Hysterectomy (not due to cancer) - Complete  Knee Surgery  Right. Ventral / Umbilical Hernia Surgery  Right. Breast Biopsy  Right. Open Inguinal Hernia Surgery - Multiple times  RIH repair 09/2012 [facility]; strangulated Rt femoral hernia 12/2012 with SB resection, primary tissue repair 12/25/12 [facility]; exp lap with SB resection, primary tissue repair of recurrent R femoral hernia 12/2012 [facility]  Diagnostic Studies History Randall Hiss M. [name], MD; 07/12/2018 9:22 AM) Colonoscopy  within last year >10 years ago Mammogram  within last year Pap Smear  1-5 years ago  Allergies Sabino Gasser, CMA; 07/12/2018 8:54 AM) Hydrocodone CP *COUGH/COLD/ALLERGY*  Cephalexin *CEPHALOSPORINS*  Ciprofloxacin *CHEMICALS*  Doxycycline Hyclate *Tetracyclines  Escitalopram Oxalate *ANTIDEPRESSANTS*  LevoFLOXacin *CHEMICALS*  Moxifloxacin HCl *CHEMICALS*  Ofloxacin *FLUOROQUINOLONES*  Sertraline HCl *ANTIDEPRESSANTS*  Adhesive Tape  Neomycin Sulfate (Topical) *DERMATOLOGICALS*  Latex  Sulfa Drugs  Allergies Reconciled   Medication History Sabino Gasser, CMA; 07/12/2018 8:54 AM) Trandolapril (2MG  Tablet, Oral) Active. Aciphex (  20MG  Tablet DR, Oral) Active. Xarelto (20MG  Tablet, Oral) Active. ALPRAZolam (0.25MG  Tablet, Oral) Active. Acetaminophen (500MG  Capsule, Oral) Active. Biotin (5000MCG  Tablet, Oral) Active. Calcium Carbonate-Vitamin D (250-125MG -UNIT Tablet, Oral) Active. Estrace (0.1MG /GM Cream, Vaginal) Active. Lasix (20MG  Tablet, Oral) Active. Magnesium Gluconate (250MG  Tablet, Oral) Active. Vitamin C (Oral) Active. Vitamin B12 (100MCG Tablet, Oral) Active. Pristiq (50MG  Tablet ER 24HR, Oral) Active. Isopto Tears (0.5% Solution, Ophthalmic) Active. Levothyroxine Sodium (112MCG Tablet, Oral) Active. Metoprolol Succinate ER (25MG  Tablet ER 24HR, Oral) Active. Medications Reconciled  Social History Randall Hiss M. [name], MD; 07/12/2018 9:22 AM) Alcohol use  Occasional alcohol use, Remotely quit alcohol use. Caffeine use  Coffee, Tea. No drug use  Tobacco use  Former smoker.  Family History Randall Hiss M. [name], MD; 07/12/2018 9:22 AM) Heart disease in female family member before age 60  Cerebrovascular Accident  Father.  Pregnancy / Birth History Randall Hiss M. [name], MD; 07/12/2018 9:22 AM) Age of menopause  <47 Gravida  0 Para  0  Other Problems Randall Hiss M. [name], MD; 07/12/2018 9:22 AM) Atrial Fibrillation  Back Pain  Chronic Obstructive Lung Disease  Gastroesophageal Reflux Disease  General anesthesia - complications  Melanoma  Oophorectomy  Bilateral. Other disease, cancer, significant illness  Thyroid Disease  Ventral Hernia Repair  Inguinal Hernia  CHRONIC SYSTOLIC CONGESTIVE HEART FAILURE, NYHA CLASS 2 (I50.22)  CHRONIC DEPRESSION (F32.9)     Review of Systems Randall Hiss M. Caresse Sedivy MD; 07/12/2018 9:16 AM) All other systems negative  Vitals Sabino Gasser CMA; 07/12/2018 8:55 AM) 07/12/2018 8:54 AM Weight: 126 lb Height: 69in Body Surface Area: 1.7 m Body Mass Index: 18.61 kg/m  Temp.: 97.49F (Oral)  Pulse: 56 (Regular)  BP: 110/72(Sitting, Left Arm, Standard)       Physical Exam Randall Hiss M. Theodor Mustin MD; 07/12/2018 9:17 AM) General Mental Status-Alert. General Appearance-Consistent with stated age. Hydration-Well  hydrated. Voice-Normal.  Integumentary Note: no rash/edema   Head and Neck Head-normocephalic, atraumatic with no lesions or palpable masses. Trachea-midline. Thyroid Gland Characteristics - normal size and consistency.  Eye Eyeball - Bilateral-Normal. Sclera/Conjunctiva - Bilateral-No scleral icterus.  ENMT Ears -Note: normal ext ears.  Mouth and Throat -Note: lips intact.   Chest and Lung Exam Chest and lung exam reveals -quiet, even and easy respiratory effort with no use of accessory muscles and on auscultation, normal breath sounds, no adventitious sounds and normal vocal resonance. Inspection Chest Wall - Normal. Back - normal.  Breast - Did not examine.  Cardiovascular Cardiovascular examination reveals -normal heart sounds, regular rate and rhythm with no murmurs and normal pedal pulses bilaterally.  Abdomen Inspection  Inspection of the abdomen reveals: Note: chaperone present - Tanisha Patient examined supine and standing with and without Valsalva maneuvers. I cannot appreciate a left inguinal hernia with Valsalva. There is no lymphadenopathy and left inguinal area. what i feel in left inguinal region is more c/w a tendon.In the lower midline just above the pubic bone about an inch and a half as well as slightly to the left of midline there is a palpable fascial defect of 1 cm. The soft. It is non tender. Completely reducible. Skin - Scar - Note: well healed trocar scars; old midline incision. Palpation/Percussion Palpation and Percussion of the abdomen reveal - Soft, Non Tender, No Rebound tenderness, No Rigidity (guarding) and No hepatosplenomegaly. Auscultation Auscultation of the abdomen reveals - Bowel sounds normal.  Peripheral Vascular Upper Extremity Palpation - Pulses bilaterally normal.  Neurologic Neurologic evaluation reveals -alert and oriented x 3 with no impairment of  recent or remote memory. Mental  Status-Normal.  Neuropsychiatric The patient's mood and affect are described as -normal. Judgment and Insight-insight is appropriate concerning matters relevant to self.  Musculoskeletal Normal Exam - Left-Upper Extremity Strength Normal and Lower Extremity Strength Normal. Normal Exam - Right-Upper Extremity Strength Normal and Lower Extremity Strength Normal.  Lymphatic Head & Neck  General Head & Neck Lymphatics: Bilateral - Description - Normal. Axillary - Did not examine. Femoral & Inguinal - Did not examine.    Assessment & Plan Randall Hiss M. Celene Pippins MD; 07/12/2018 9:22 AM)  INCISIONAL HERNIA, WITHOUT OBSTRUCTION OR GANGRENE (K43.2) Impression: Her incisional hernia is of same size. It is not any larger. We rediscussed signs and symptoms of incarceration and strangulation. We have had multiple ongoing discussions over the past year regarding her incisional hernia. She has a small recurrence in the left lower quadrant. At this point we discussed ongoing observation versus surgical management. She is a very anxious and nervous lady. I think the risk of strangulation is quite low. However she continues to fret about her hernia. At this point I recommended proceeding with surgery. However I told her we would need to get cardiac clearance. If she is felt to be a high risk from a cardiac perspective then I would recommend ongoing observation because I think the risk of strangulation is low. If she is low risk from a cardiac perspective and I think the best thing overall for her for peace of mind in mental health would be to undergo surgery. She worries about her hernia on a daily basis. If we proceed with surgery I recommended a laparoscopic assisted repair of incisional hernia with possibility of mesh. We rediscussed surgery including but not limited to bleeding, infection, injury to surrounding structures, recurrence, blood clot formation, perioperative cardiac and pulmonary events, pain,  ileus, mesh issues, potential risk of exposure to coronavirus. She would at least need an overnight stay since she lives by herself. She is going to think about it and let us know how she would like to proceed  Current Plans Follow up as needed Pt Education - [facility] Free Text Education/Instructions: discussed with patient and provided information.  CHRONIC SYSTOLIC CONGESTIVE HEART FAILURE, NYHA CLASS 2 (I50.22) Impression: we will need her risk assessment from cardiology before scheduling if she decides to proceed   CHRONIC DEPRESSION (F32.9) Impression: She is currently not taking her antidepressant. Encouraged her that she should consider restarting it or at least talk to her primary care doctor about   PACEMAKER (Z95.0)  [name]. [name], MD, FACS General, Bariatric, & Minimally Invasive Surgery [facility], [place]

## 2018-09-07 NOTE — Anesthesia Procedure Notes (Signed)
Procedure Name: Intubation Date/Time: 09/07/2018 12:42 PM Performed by: Jenne Campus, CRNA Pre-anesthesia Checklist: Patient identified, Emergency Drugs available, Suction available and Patient being monitored Patient Re-evaluated:Patient Re-evaluated prior to induction Oxygen Delivery Method: Circle System Utilized Preoxygenation: Pre-oxygenation with 100% oxygen Induction Type: IV induction Laryngoscope Size: Miller and 2 Grade View: Grade I Tube type: Oral Tube size: 7.0 mm Number of attempts: 1 Airway Equipment and Method: Stylet and Oral airway Placement Confirmation: ETT inserted through vocal cords under direct vision,  positive ETCO2 and breath sounds checked- equal and bilateral Secured at: 21 cm Tube secured with: Tape Dental Injury: Teeth and Oropharynx as per pre-operative assessment

## 2018-09-07 NOTE — Transfer of Care (Signed)
Immediate Anesthesia Transfer of Care Note  Patient: [name]  Procedure(s) Performed: LAPAROSCOPIC ASSISTED REPAIR OF INCISIONAL HERNIA (N/A Abdomen)  Patient Location: PACU  Anesthesia Type:General  Level of Consciousness: sedated and patient cooperative  Airway & Oxygen Therapy: Patient Spontanous Breathing and Patient connected to face mask oxygen  Post-op Assessment: Report given to RN and Post -op Vital signs reviewed and stable  Post vital signs: Reviewed  Last Vitals:  Vitals Value Taken Time  BP 165/71 09/07/18 1407  Temp    Pulse 70 09/07/18 1412  Resp 14 09/07/18 1412  SpO2 100 % 09/07/18 1412  Vitals shown include unvalidated device data.  Last Pain:  Vitals:   09/07/18 1107  TempSrc:   PainSc: 0-No pain      Patients Stated Pain Goal: 4 (72/62/03 5597)  Complications: No apparent anesthesia complications

## 2018-09-07 NOTE — H&P (Signed)
[name] is an 85 y.o. female.   Chief Complaint: here for surgery HPI: 85 yo female presents for incisional hernia repair. No changes since seen in May.   The patient is a 85 year old female who presents with an incisional hernia. She comes in again with concerns regarding her left lower midline incisional hernia. I last saw her in February. She states that after I reduced it into clinic in February she had a few days of discomfort in that area. She states about a week ago after eating breakfast she developed some pain in that location that radiated up her left abdomen to her side. She had no nausea or vomiting. After laying down the bulge went away. For the past several months the bulge has been going away with laying down. She denies any fevers or chills. She denies any diarrhea or constipation. She has an upcoming cardiology appointment in the middle the month. She states that she will need her pacemaker exchanged in the summer. She denies any chest pain, chest pressure, shortness of breath, dyspnea on exertion. She is very anxious lady. She also states that the lump in the left groin she feels a little bit bigger  Past Medical History:  Diagnosis Date  . AAA (abdominal aortic aneurysm) (HCC)    2.8 cm 05/2018; 5 year [place] per ACR guidelines  . Allergy    all year   . Anxiety disorder   . Arthritis   . Barrett's esophagus 04/2008  . Benign neoplasm of colon   . Blood transfusion without reported diagnosis    long ago per pt  . Cataract    removed both eyes  . CHF (congestive heart failure) ([place])   . Chronic airway obstruction, not elsewhere classified   . Complication of anesthesia    slow to wake up  . Depressive disorder, not elsewhere classified   . Diverticulitis   . Dysthymic disorder   . Erosive gastritis 08/2004  . GERD (gastroesophageal reflux disease)   . Hair thinning   . HTN (hypertension)    pt denies   . Hyperplastic polyps of stomach 06/2002  .  Hypothyroidism   . ICD (implantable cardiac defibrillator) in place    BiV/ICD; s/p removal of ICD with insertion of BiV PPM 04/26/12  . Incisional hernia   . Insomnia 08/20/2015  . Kyphosis 08/20/2015  . LBBB (left bundle branch block)   . Melanoma ([place])    right arm  . Mitral regurgitation   . Nonischemic cardiomyopathy ([place])    EF initially 20%; Last measurement up to 50% per echo in August of 2012  . Osteopenia   . Other and unspecified hyperlipidemia   . Other and unspecified hyperlipidemia   . Pacemaker 04/26/2012  . PAF (paroxysmal atrial fibrillation) ([place])    11/2016  . Pain in joint, lower leg   . Palpitations   . Pneumonia   . Restless legs syndrome (RLS)   . Synovial cyst of popliteal space   . Unspecified chronic bronchitis ([place])   . Unspecified nasal polyp   . Unspecified sinusitis (chronic)     Past Surgical History:  Procedure Laterality Date  . ABDOMINAL HYSTERECTOMY  1076   endometriosis  . APPENDECTOMY    . BI-VENTRICULAR PACEMAKER INSERTION N/A 04/26/2012   Procedure: BI-VENTRICULAR PACEMAKER INSERTION (CRT-P);  Surgeon: Evans Lance, MD;  Location: [facility] CATH LAB;  Service: Cardiovascular;  Laterality: N/A;  . COLONOSCOPY    . defibrillator insertion  s/p removal of previously implanted BiV ICD and insertion of a new BiV pacemaker on 04/26/12  . excise mole of lip  2001   Dr. Larena Sox  . excision of wen  1984   Dr. Parks Ranger  . FEMORAL HERNIA REPAIR  12/25/2012   Dr Dalbert Batman  . FEMORAL HERNIA REPAIR  01/04/2013   Recurrent - Dr. Lilyan Punt  . INCISIONAL HERNIA REPAIR N/A 10/01/2013   Procedure: LAPAROSCOPIC INCISIONAL HERNIA;  Surgeon: Gayland Curry, MD;  Location: [facility];  Service: General;  Laterality: N/A;  . INGUINAL HERNIA REPAIR Right 09/26/2012   Procedure: RIGHT INGUINAL HERNIA REPAIR WITH MESH;  Surgeon: Odis Hollingshead, MD;  Location: [place];  Service: General;  Laterality: Right;  . INGUINAL HERNIA REPAIR Right 12/25/2012   Procedure: explor  right groin, small bowel rescection, tissue repair right femoral hernia;  Surgeon: Adin Hector, MD;  Location: [facility];  Service: General;  Laterality: Right;  . INSERTION OF MESH Right 09/26/2012   Procedure: INSERTION OF MESH;  Surgeon: Odis Hollingshead, MD;  Location: [facility];  Service: General;  Laterality: Right;  . INSERTION OF MESH N/A 10/01/2013   Procedure: INSERTION OF MESH;  Surgeon: Gayland Curry, MD;  Location: [facility];  Service: General;  Laterality: N/A;  . LAPAROSCOPY N/A 01/04/2013   Procedure: Diagnostic Laparoscopy, exploratory laparotomy with small bowel resection, closure of right femoral hernia repair;  Surgeon: Madilyn Hook, DO;  Location: [facility];  Service: General;  Laterality: N/A;  . Left arm surgery    . POLYPECTOMY    . RIGHT BREAST LUMPECTOMY  1988   Dr. Marylene Buerger  . right knee surgery     arthroscopy: Dr. Shellia Carwin  . TONSILLECTOMY    . UPPER GASTROINTESTINAL ENDOSCOPY      Family History  Problem Relation Age of Onset  . Stroke Father   . Heart disease Other        maternal side  . Colon cancer Paternal Uncle   . Breast cancer Maternal Aunt   . Colon cancer Cousin   . Diabetes Neg Hx   . Colon polyps Neg Hx   . Rectal cancer Neg Hx   . Stomach cancer Neg Hx    Social History:  reports that she quit smoking about 24 years ago. Her smoking use included cigarettes. She has a 15.00 pack-year smoking history. She has never used smokeless tobacco. She reports that she does not drink alcohol or use drugs.  Allergies:  Allergies  Allergen Reactions  . Hydrocodone Itching  . Cephalexin Itching and Swelling  . Ciprofloxacin     Not indicated due to aneurysm in aorta    . Doxycycline Other (See Comments)    Per pt: unknown  . Escitalopram     Dry mouth  . Levofloxacin     Not indicated due to aneurysm in aorta   . Moxifloxacin     Not indicated due to aneurysm in aorta   . Ofloxacin Other (See Comments)    Per pt: unknown  . Sertraline      insomnia  . Sulfamethoxazole Hives    Any sulfa meds.  . Tape Other (See Comments)    Burns skin.   . Latex Rash    "If pt. Makes contact with or wearing"  . Neomycin Rash    Facility-Administered Medications Prior to Admission  Medication Dose Route Frequency Provider Last Rate Last Dose  . 0.9 %  sodium chloride infusion  500 mL Intravenous  Once Milus Banister, MD       Medications Prior to Admission  Medication Sig Dispense Refill  . acetaminophen (TYLENOL) 500 MG tablet Take 1,000 mg by mouth every 8 (eight) hours as needed. For pain     . Ascorbic Acid (VITAMIN C) 1000 MG tablet Take 1,000 mg by mouth daily.    . Biotin 5000 MCG TABS Take 1 tablet by mouth daily.    . Calcium Carbonate-Vitamin D (CALCIUM + D PO) Take 2 tablets by mouth daily.     . Cyanocobalamin (VITAMIN B 12) 500 MCG TABS Take 1,000 mcg by mouth daily.     Marland Kitchen estradiol (ESTRACE) 0.1 MG/GM vaginal cream Place 1 g vaginally 2 (two) times a week.    . fexofenadine (ALLEGRA) 60 MG tablet Take 60 mg by mouth daily as needed for allergies.    Marland Kitchen guaiFENesin (MUCINEX) 600 MG 12 hr tablet Take 600 mg by mouth daily as needed (nasal congestion).    . Magnesium 250 MG TABS Take 250 mg by mouth daily.     . Multiple Vitamin (MULTIVITAMIN WITH MINERALS) TABS Take 1 tablet by mouth daily.    . RABEprazole (ACIPHEX) 20 MG tablet Take one tablet by mouth once daily for stomach (Patient taking differently: Take 20 mg by mouth daily. Take one tablet by mouth once daily for stomach) 90 tablet 1  . rivaroxaban (XARELTO) 20 MG TABS tablet Take 1 tablet (20 mg total) by mouth daily with supper. 90 tablet 3  . SYNTHROID 100 MCG tablet TAKE 1 TABLET (100 MCG TOTAL) BY MOUTH DAILY BEFORE BREAKFAST. (Patient taking differently: Take 100 mcg by mouth daily before breakfast. ) 90 tablet 1  . trandolapril (MAVIK) 2 MG tablet TAKE 1 TABLET BY MOUTH TWICE A DAY 180 tablet 3  . buPROPion (WELLBUTRIN XL) 150 MG 24 hr tablet TAKE 1 TABLET BY  MOUTH EVERY DAY 90 tablet 1  . furosemide (LASIX) 20 MG tablet Take 1 tablet (20 mg total) by mouth daily as needed for fluid or edema. 30 tablet 6  . potassium chloride SA (K-DUR) 20 MEQ tablet Take 1 tablet (20 mEq total) by mouth daily as needed (only with lasix). 30 tablet 6    Results for orders placed or performed during the hospital encounter of 09/07/18 (from the past 48 hour(s))  PT- INR Day of Surgery     Status: None   Collection Time: 09/07/18 11:14 AM  Result Value Ref Range   Prothrombin Time 12.9 11.4 - 15.2 seconds   INR 1.0 0.8 - 1.2    Comment: (NOTE) INR goal varies based on device and disease states. Performed at [facility], [place] [address] [place], [place] [address]    No results found.  Review of Systems  All other systems reviewed and are negative.   Blood pressure (!) 169/65, pulse (!) 56, temperature 97.6 F (36.4 C), temperature source Oral, resp. rate 18, height 5\' 8"  (1.727 m), weight 56.7 kg, SpO2 100 %. Physical Exam  Vitals reviewed. Constitutional: She is oriented to person, place, and time. She appears well-developed and well-nourished. No distress.  HENT:  Head: Normocephalic and atraumatic.  Right Ear: External ear normal.  Left Ear: External ear normal.  Eyes: Conjunctivae are normal. No scleral icterus.  Neck: Normal range of motion. Neck supple. No tracheal deviation present. No thyromegaly present.  Cardiovascular: Normal rate and normal heart sounds.  pacemaker  Respiratory: Effort normal and breath sounds normal. No stridor.  No respiratory distress. She has no wheezes.  GI: Soft. She exhibits no distension. There is no rebound.    Old trocar incisions & old abd incisions. Small bulge L suprapubic area  Musculoskeletal:        General: No tenderness or edema.  Lymphadenopathy:    She has no cervical adenopathy.  Neurological: She is alert and oriented to person, place, and time. She exhibits normal muscle tone.  Skin: Skin  is warm and dry. No rash noted. She is not diaphoretic. No erythema. No pallor.  Psychiatric: She has a normal mood and affect. Her behavior is normal. Judgment and thought content normal.     Assessment/Plan INCISIONAL HERNIA, WITHOUT OBSTRUCTION OR GANGRENE (K43.2) Impression: Her incisional hernia is of same size. It is not any larger. We rediscussed signs and symptoms of incarceration and strangulation. We have had multiple ongoing discussions over the past year regarding her incisional hernia. She has a small recurrence in the left lower quadrant. At this point we discussed ongoing observation versus surgical management. She is a very anxious and nervous lady. I think the risk of strangulation is quite low. However she continues to fret about her hernia. At this point I recommended proceeding with surgery. However I told her we would need to get cardiac clearance. If she is felt to be a high risk from a cardiac perspective then I would recommend ongoing observation because I think the risk of strangulation is low. If she is low risk from a cardiac perspective and I think the best thing overall for her for peace of mind in mental health would be to undergo surgery. She worries about her hernia on a daily basis. If we proceed with surgery I recommended a laparoscopic assisted repair of incisional hernia with possibility of mesh. We rediscussed surgery including but not limited to bleeding, infection, injury to surrounding structures, recurrence, blood clot formation, perioperative cardiac and pulmonary events, pain, ileus, mesh issues, potential risk of exposure to coronavirus. She would at least need an overnight stay since she lives by herself. She is going to think about it and let us know how she would like to proceed  Current Plans Follow up as needed Pt Education - [facility] Free Text Education/Instructions: discussed with patient and provided information.  CHRONIC SYSTOLIC CONGESTIVE HEART FAILURE,  NYHA CLASS 2 (I50.22) Impression: we will need her risk assessment from cardiology before scheduling if she decides to proceed   CHRONIC DEPRESSION (F32.9) Impression: She is currently not taking her antidepressant. Encouraged her that she should consider restarting it or at least talk to her primary care doctor about   PACEMAKER (Z95.0)  Greer Pickerel, MD 09/07/2018, 12:14 PM

## 2018-09-08 DIAGNOSIS — Z87891 Personal history of nicotine dependence: Secondary | ICD-10-CM | POA: Diagnosis not present

## 2018-09-08 DIAGNOSIS — Z950 Presence of cardiac pacemaker: Secondary | ICD-10-CM | POA: Diagnosis not present

## 2018-09-08 DIAGNOSIS — Z1159 Encounter for screening for other viral diseases: Secondary | ICD-10-CM | POA: Diagnosis not present

## 2018-09-08 DIAGNOSIS — Z7901 Long term (current) use of anticoagulants: Secondary | ICD-10-CM | POA: Diagnosis not present

## 2018-09-08 DIAGNOSIS — K432 Incisional hernia without obstruction or gangrene: Secondary | ICD-10-CM | POA: Diagnosis not present

## 2018-09-08 DIAGNOSIS — J449 Chronic obstructive pulmonary disease, unspecified: Secondary | ICD-10-CM | POA: Diagnosis not present

## 2018-09-08 MED ORDER — TRAMADOL HCL 50 MG PO TABS: 50.0000 mg | ORAL_TABLET | Freq: Four times a day (QID) | ORAL | 0 refills | Status: DC | PRN

## 2018-09-08 NOTE — Discharge Instructions (Signed)
Ventral Hernia  A ventral hernia is a bulge of tissue from inside the abdomen that pushes through a weak area of the muscles that form the front wall of the abdomen. The tissues inside the abdomen are inside a sac (peritoneum). These tissues include the small intestine, large intestine, and the fatty tissue that covers the intestines (omentum). Sometimes, the bulge that forms a hernia contains intestines. Other hernias contain only fat. Ventral hernias do not go away without surgical treatment. There are several types of ventral hernias. You may have:  A hernia at an incision site from previous abdominal surgery (incisional hernia).  A hernia just above the belly button (epigastric hernia), or at the belly button (umbilical hernia). These types of hernias can develop from heavy lifting or straining.  A hernia that comes and goes (reducible hernia). It may be visible only when you lift or strain. This type of hernia can be pushed back into the abdomen (reduced).  A hernia that traps abdominal tissue inside the hernia (incarcerated hernia). This type of hernia does not reduce.  A hernia that cuts off blood flow to the tissues inside the hernia (strangulated hernia). The tissues can start to die if this happens. This is a very painful bulge that cannot be reduced. A strangulated hernia is a medical emergency. What are the causes? This condition is caused by abdominal tissue putting pressure on an area of weakness in the abdominal muscles. What increases the risk? The following factors may make you more likely to develop this condition:  Being female.  Being 17 or older.  Being overweight or obese.  Having had previous abdominal surgery, especially if there was an infection after surgery.  Having had an injury to the abdominal wall.  Having had several pregnancies.  Having a buildup of fluid inside the abdomen (ascites). What are the signs or symptoms? The only symptom of a ventral hernia  may be a painless bulge in the abdomen. A reducible hernia may be visible only when you strain, cough, or lift. Other symptoms may include:  Dull pain.  A feeling of pressure. Signs and symptoms of a strangulated hernia may include:  Increasing pain.  Nausea and vomiting.  Pain when pressing on the hernia.  The skin over the hernia turning red or purple.  Constipation.  Blood in the stool (feces). How is this diagnosed? This condition may be diagnosed based on:  Your symptoms.  Your medical history.  A physical exam. You may be asked to cough or strain while standing. These actions increase the pressure inside your abdomen and force the hernia through the opening in your muscles. Your health care provider may try to reduce the hernia by pressing on it.  Imaging studies, such as an ultrasound or CT scan. How is this treated? This condition is treated with surgery. If you have a strangulated hernia, surgery is done as soon as possible. If your hernia is small and not incarcerated, you may be asked to lose some weight before surgery. Follow these instructions at home:  Follow instructions from your health care provider about eating or drinking restrictions.  If you are overweight, your health care provider may recommend that you increase your activity level and eat a healthier diet.  Do not lift anything that is heavier than 10 lb (4.5 kg).  Return to your normal activities as told by your health care provider. Ask your health care provider what activities are safe for you. You may need to avoid activities  that increase pressure on your hernia.  Take over-the-counter and prescription medicines only as told by your health care provider.  Keep all follow-up visits as told by your health care provider. This is important. Contact a health care provider if:  Your hernia gets larger.  Your hernia becomes painful. Get help right away if:  Your hernia becomes increasingly  painful.  You have pain along with any of the following: ? Changes in skin color in the area of the hernia. ? Nausea. ? Vomiting. ? Fever. Summary  A ventral hernia is a bulge of tissue from inside the abdomen that pushes through a weak area of the muscles that form the front wall of the abdomen.  This condition is treated with surgery, which may be urgent depending on your hernia.  Do not lift anything that is heavier than 10 lb (4.5 kg), and follow activity instructions from your health care provider. This information is not intended to replace advice given to you by your health care provider. Make sure you discuss any questions you have with your health care provider. Document Released: 02/09/2012 Document Revised: 04/06/2017 Document Reviewed: 09/13/2016 Elsevier Patient Education  2020 [employer] LAPAROSCOPIC SURGERY: POST OP INSTRUCTIONS Always review your discharge instruction sheet given to you by the facility where your surgery was performed. IF YOU HAVE DISABILITY OR FAMILY LEAVE FORMS, YOU MUST BRING THEM TO THE OFFICE FOR PROCESSING.   DO NOT GIVE THEM TO YOUR DOCTOR.  PAIN CONTROL  1. First take acetaminophen (Tylenol) AND/or ibuprofen (Advil) to control your pain after surgery.  Follow directions on package.  Taking acetaminophen (Tylenol) and/or ibuprofen (Advil) regularly after surgery will help to control your pain and lower the amount of prescription pain medication you may need.  You should not take more than 3,000 mg (3 grams) of acetaminophen (Tylenol) in 24 hours.  You should not take ibuprofen (Advil), aleve, motrin, naprosyn or other NSAIDS if you have a history of stomach ulcers or chronic kidney disease.  2. A prescription for pain medication may be given to you upon discharge.  Take your pain medication as prescribed, if you still have uncontrolled pain after taking acetaminophen (Tylenol) or ibuprofen (Advil). 3. Use ice  packs to help control pain. 4. If you need a refill on your pain medication, please contact your pharmacy.  They will contact our office to request authorization. Prescriptions will not be filled after 5pm or on week-ends.  HOME MEDICATIONS 5. Take your usually prescribed medications unless otherwise directed.  DIET 6. You should follow a light diet the first few days after arrival home.  Be sure to include lots of fluids daily. Avoid fatty, fried foods.   CONSTIPATION 7. It is common to experience some constipation after surgery and if you are taking pain medication.  Increasing fluid intake and taking a stool softener (such as Colace) will usually help or prevent this problem from occurring.  A mild laxative (Milk of Magnesia or Miralax) should be taken according to package instructions if there are no bowel movements after 48 hours.  WOUND/INCISION CARE 8. Most patients will experience some swelling and bruising in the area of the incisions.  Ice packs will help.  Swelling and bruising can take several days to resolve.  9. Unless discharge instructions indicate otherwise, follow guidelines below  a. STERI-STRIPS - you may remove your outer bandages 48 hours after surgery, and you may shower at that time.  You have steri-strips (  small skin tapes) in place directly over the incision.  These strips should be left on the skin for 7-10 days.   b. DERMABOND/SKIN GLUE - you may shower in 24 hours.  The glue will flake off over the next 2-3 weeks. 10. Any sutures or staples will be removed at the office during your follow-up visit.  ACTIVITIES 11. You may resume regular (light) daily activities beginning the next day--such as daily self-care, walking, climbing stairs--gradually increasing activities as tolerated.  You may have sexual intercourse when it is comfortable.  Refrain from any heavy lifting or straining until approved by your doctor. a. You may drive when you are no longer taking  prescription pain medication, you can comfortably wear a seatbelt, and you can safely maneuver your car and apply brakes.  FOLLOW-UP 12. You should see your doctor in the office for a follow-up appointment approximately 2-3 weeks after your surgery.  You should have been given your post-op/follow-up appointment when your surgery was scheduled.  If you did not receive a post-op/follow-up appointment, make sure that you call for this appointment within a day or two after you arrive home to insure a convenient appointment time.  OTHER INSTRUCTIONS 13.   WHEN TO CALL YOUR DOCTOR: 1. Fever over 101.0 2. Inability to urinate 3. Continued bleeding from incision. 4. Increased pain, redness, or drainage from the incision. 5. Increasing abdominal pain  The clinic staff is available to answer your questions during regular business hours.  Please dont hesitate to call and ask to speak to one of the nurses for clinical concerns.  If you have a medical emergency, go to the nearest emergency room or call 911.  A surgeon from [facility] is always on call at the hospital. [address] [place], [place], [place]  [address] ? [address], [place],    [address] [phone] ? [phone] ? FAX [phone] Web site: [email or web address]     Managing Your Pain After Surgery Without Opioids    Thank you for participating in our program to help patients manage their pain after surgery without opioids. This is part of our effort to provide you with the best care possible, without exposing you or your family to the risk that opioids pose.  What pain can I expect after surgery? You can expect to have some pain after surgery. This is normal. The pain is typically worse the day after surgery, and quickly begins to get better. Many studies have found that many patients are able to manage their pain after surgery with Over-the-Counter (OTC) medications such as  Tylenol and Motrin. If you have a condition that does not allow you to take Tylenol or Motrin, notify your surgical team.  How will I manage my pain? The best strategy for controlling your pain after surgery is around the clock pain control with Tylenol (acetaminophen) and Motrin (ibuprofen or Advil). Alternating these medications with each other allows you to maximize your pain control. In addition to Tylenol and Motrin, you can use heating pads or ice packs on your incisions to help reduce your pain.  How will I alternate your regular strength over-the-counter pain medication? You will take a dose of pain medication every three hours. ; Start by taking 650 mg of Tylenol (2 pills of 325 mg) ; 3 hours later take 600 mg of Motrin (3 pills of 200 mg) ; 3 hours after taking the Motrin take 650 mg of Tylenol ; 3 hours after that take  600 mg of Motrin.   - 1 -  See example - if your first dose of Tylenol is at 12:00 PM   12:00 PM Tylenol 650 mg (2 pills of 325 mg)  3:00 PM Motrin 600 mg (3 pills of 200 mg)  6:00 PM Tylenol 650 mg (2 pills of 325 mg)  9:00 PM Motrin 600 mg (3 pills of 200 mg)  Continue alternating every 3 hours   We recommend that you follow this schedule around-the-clock for at least 3 days after surgery, or until you feel that it is no longer needed. Use the table on the last page of this handout to keep track of the medications you are taking. Important: Do not take more than 3000mg  of Tylenol or 2300mg  of Motrin in a 24-hour period. Do not take ibuprofen/Motrin if you have a history of bleeding stomach ulcers, severe kidney disease, &/or actively taking a blood thinner  What if I still have pain? If you have pain that is not controlled with the over-the-counter pain medications (Tylenol and Motrin or Advil) you might have what we call breakthrough pain. You will receive a prescription for a small amount of an opioid pain medication such as Oxycodone, Tramadol, or  Tylenol with Codeine. Use these opioid pills in the first 24 hours after surgery if you have breakthrough pain. Do not take more than 1 pill every 4-6 hours.  If you still have uncontrolled pain after using all opioid pills, don't hesitate to call our staff using the number provided. We will help make sure you are managing your pain in the best way possible, and if necessary, we can provide a prescription for additional pain medication.   Day 1    Time  Name of Medication Number of pills taken  Amount of Acetaminophen  Pain Level   Comments  AM PM       AM PM       AM PM       AM PM       AM PM       AM PM       AM PM       AM PM       Total Daily amount of Acetaminophen Do not take more than  3,000 mg per day      Day 2    Time  Name of Medication Number of pills taken  Amount of Acetaminophen  Pain Level   Comments  AM PM       AM PM       AM PM       AM PM       AM PM       AM PM       AM PM       AM PM       Total Daily amount of Acetaminophen Do not take more than  3,000 mg per day      Day 3    Time  Name of Medication Number of pills taken  Amount of Acetaminophen  Pain Level   Comments  AM PM       AM PM       AM PM       AM PM          AM PM       AM PM       AM PM       AM PM  Total Daily amount of Acetaminophen Do not take more than  3,000 mg per day      Day 4    Time  Name of Medication Number of pills taken  Amount of Acetaminophen  Pain Level   Comments  AM PM       AM PM       AM PM       AM PM       AM PM       AM PM       AM PM       AM PM       Total Daily amount of Acetaminophen Do not take more than  3,000 mg per day      Day 5    Time  Name of Medication Number of pills taken  Amount of Acetaminophen  Pain Level   Comments  AM PM       AM PM       AM PM       AM PM       AM PM       AM PM       AM PM       AM PM       Total Daily amount of Acetaminophen Do not take more than  3,000 mg  per day       Day 6    Time  Name of Medication Number of pills taken  Amount of Acetaminophen  Pain Level  Comments  AM PM       AM PM       AM PM       AM PM       AM PM       AM PM       AM PM       AM PM       Total Daily amount of Acetaminophen Do not take more than  3,000 mg per day      Day 7    Time  Name of Medication Number of pills taken  Amount of Acetaminophen  Pain Level   Comments  AM PM       AM PM       AM PM       AM PM       AM PM       AM PM       AM PM       AM PM       Total Daily amount of Acetaminophen Do not take more than  3,000 mg per day        For additional information about how and where to safely dispose of unused opioid medications - [employer]  Disclaimer: This document contains information and/or instructional materials adapted from [facility] for the typical patient with your condition. It does not replace medical advice from your health care provider because your experience may differ from that of the typical patient. Talk to your health care provider if you have any questions about this document, your condition or your treatment plan. Adapted from [employer]

## 2018-09-08 NOTE — Discharge Summary (Signed)
Physician Discharge Summary  Patient ID: [name] MRN: [id number] DOB/AGE: March 01, 1933 85 y.o.  PCP: Gayland Curry, DO  Admit date: 09/07/2018 Discharge date: 09/08/2018  Admission Diagnoses:  Ventral hernia  Discharge Diagnoses:  Same post primary repair by Dr. [name]  Active Problems:   Incisional hernia   Surgery:  Ventral hernia repair  Discharged Condition: improved  Hospital Course:   Had surgery on Thursday.  Kept overnight.  Wound OK.  Ready for discharge on Friday, July 3  Consults: none  Significant Diagnostic Studies: none    Discharge Exam: Blood pressure 115/70, pulse 65, temperature (!) 97.3 F (36.3 C), temperature source Oral, resp. rate 18, height 5\' 8"  (1.727 m), weight 56.7 kg, SpO2 99 %. Incision with minimal bruise  Disposition: Discharge disposition: 01-Home or Self Care       Discharge Instructions    Call MD for:  severe uncontrolled pain   Complete by: As directed    Diet - low sodium heart healthy   Complete by: As directed    Discharge wound care:   Complete by: As directed    May shower and get incision wet.   Driving Restrictions   Complete by: As directed    Don't drive if you are requiring pain medications Would wait at least 2 days before trying to drive.   Increase activity slowly   Complete by: As directed      Allergies as of 09/08/2018      Reactions   Hydrocodone Itching   Cephalexin Itching, Swelling   Ciprofloxacin    Not indicated due to aneurysm in aorta     Doxycycline Other (See Comments)   Per pt: unknown   Escitalopram    Dry mouth   Levofloxacin    Not indicated due to aneurysm in aorta    Moxifloxacin    Not indicated due to aneurysm in aorta    Ofloxacin Other (See Comments)   Per pt: unknown   Sertraline    insomnia   Sulfamethoxazole Hives   Any sulfa meds.   Tape Other (See Comments)   Burns skin.    Latex Rash   "If pt. Makes contact with or wearing"   Neomycin Rash      Medication  List    TAKE these medications   acetaminophen 500 MG tablet Commonly known as: TYLENOL Take 1,000 mg by mouth every 8 (eight) hours as needed. For pain   Biotin 5000 MCG Tabs Take 1 tablet by mouth daily.   buPROPion 150 MG 24 hr tablet Commonly known as: WELLBUTRIN XL TAKE 1 TABLET BY MOUTH EVERY DAY   CALCIUM + D PO Take 2 tablets by mouth daily.   estradiol 0.1 MG/GM vaginal cream Commonly known as: ESTRACE Place 1 g vaginally 2 (two) times a week.   fexofenadine 60 MG tablet Commonly known as: ALLEGRA Take 60 mg by mouth daily as needed for allergies.   furosemide 20 MG tablet Commonly known as: LASIX Take 1 tablet (20 mg total) by mouth daily as needed for fluid or edema.   guaiFENesin 600 MG 12 hr tablet Commonly known as: MUCINEX Take 600 mg by mouth daily as needed (nasal congestion).   Magnesium 250 MG Tabs Take 250 mg by mouth daily.   multivitamin with minerals Tabs tablet Take 1 tablet by mouth daily.   potassium chloride SA 20 MEQ tablet Commonly known as: K-DUR Take 1 tablet (20 mEq total) by mouth daily as needed (only with  lasix).   RABEprazole 20 MG tablet Commonly known as: ACIPHEX Take one tablet by mouth once daily for stomach What changed:   how much to take  how to take this  when to take this   rivaroxaban 20 MG Tabs tablet Commonly known as: Xarelto Take 1 tablet (20 mg total) by mouth daily with supper.   Synthroid 100 MCG tablet Generic drug: levothyroxine TAKE 1 TABLET (100 MCG TOTAL) BY MOUTH DAILY BEFORE BREAKFAST. What changed: See the new instructions.   traMADol 50 MG tablet Commonly known as: ULTRAM Take 1 tablet (50 mg total) by mouth every 6 (six) hours as needed (mild pain).   trandolapril 2 MG tablet Commonly known as: MAVIK TAKE 1 TABLET BY MOUTH TWICE A DAY   Vitamin B 12 500 MCG Tabs Take 1,000 mcg by mouth daily.   vitamin C 1000 MG tablet Take 1,000 mg by mouth daily.            Discharge Care  Instructions  (From admission, onward)         Start     Ordered   09/08/18 0000  Discharge wound care:    Comments: May shower and get incision wet.   09/08/18 7412         Follow-up Information    Greer Pickerel, MD. Schedule an appointment as soon as possible for a visit in 3 week(s).   Specialty: General Surgery Contact information: [address] [place] [address] [phone]           Signed: Pedro Earls 09/08/2018, 9:49 AM

## 2018-09-08 NOTE — Plan of Care (Signed)
  Problem: Education: Goal: Required Educational Video(s) Outcome: Adequate for Discharge   Problem: Clinical Measurements: Goal: Ability to maintain clinical measurements within normal limits will improve Outcome: Adequate for Discharge Goal: Postoperative complications will be avoided or minimized Outcome: Adequate for Discharge   Problem: Skin Integrity: Goal: Demonstration of wound healing without infection will improve Outcome: Adequate for Discharge   

## 2018-09-08 NOTE — Anesthesia Postprocedure Evaluation (Signed)
Anesthesia Post Note  Patient: [name]  Procedure(s) Performed: LAPAROSCOPIC ASSISTED REPAIR OF INCISIONAL HERNIA (N/A Abdomen)     Patient location during evaluation: PACU Anesthesia Type: General Level of consciousness: awake and alert Pain management: pain level controlled Vital Signs Assessment: post-procedure vital signs reviewed and stable Respiratory status: spontaneous breathing, nonlabored ventilation, respiratory function stable and patient connected to nasal cannula oxygen Cardiovascular status: blood pressure returned to baseline and stable Postop Assessment: no apparent nausea or vomiting Anesthetic complications: no    Last Vitals:  Vitals:   09/07/18 2037 09/08/18 0502  BP: (!) 143/54 115/70  Pulse: 69 65  Resp: 18 18  Temp: 36.7 C (!) 36.3 C  SpO2: 99% 99%    Last Pain:  Vitals:   09/08/18 0843  TempSrc:   PainSc: 2                  [name] DAVID

## 2018-09-08 NOTE — Progress Notes (Signed)
[name] to be D/C'd  per MD order. Discussed with the patient and all questions fully answered.  VSS, Skin clean, dry and intact without evidence of skin break down, no evidence of skin tears noted.  IV catheter discontinued intact. Site without signs and symptoms of complications. Dressing and pressure applied.  An After Visit Summary was printed and given to the patient. Patient received prescription.  D/c education completed with patient/family including follow up instructions, medication list, d/c activities limitations if indicated, with other d/c instructions as indicated by MD - patient able to verbalize understanding, all questions fully answered.   Patient instructed to return to ED, call 911, or call MD for any changes in condition.   Patient to be escorted via [place], and D/C home via private auto.

## 2018-09-08 NOTE — Plan of Care (Signed)
  Problem: Education: Goal: Required Educational Video(s) 09/08/2018 1049 by Candida Peeling, RN Outcome: Completed/Met 09/08/2018 1049 by Candida Peeling, RN Outcome: Adequate for Discharge   Problem: Clinical Measurements: Goal: Ability to maintain clinical measurements within normal limits will improve 09/08/2018 1049 by Candida Peeling, RN Outcome: Completed/Met 09/08/2018 1049 by Candida Peeling, RN Outcome: Adequate for Discharge Goal: Postoperative complications will be avoided or minimized 09/08/2018 1049 by Candida Peeling, RN Outcome: Completed/Met 09/08/2018 1049 by Candida Peeling, RN Outcome: Adequate for Discharge   Problem: Skin Integrity: Goal: Demonstration of wound healing without infection will improve 09/08/2018 1049 by Candida Peeling, RN Outcome: Completed/Met 09/08/2018 1049 by Candida Peeling, RN Outcome: Adequate for Discharge

## 2018-09-09 ENCOUNTER — Encounter (HOSPITAL_COMMUNITY): Payer: Self-pay | Admitting: General Surgery

## 2018-09-25 ENCOUNTER — Telehealth: Payer: Self-pay | Admitting: Student

## 2018-09-25 NOTE — Telephone Encounter (Signed)
Number in chart busy. No answer. Unable to screen for COVID at this time.    Legrand Como [address]" [place], PA-C 09/25/2018 4:22 PM

## 2018-09-26 ENCOUNTER — Ambulatory Visit (INDEPENDENT_AMBULATORY_CARE_PROVIDER_SITE_OTHER): Payer: Medicare Other | Admitting: Student

## 2018-09-26 ENCOUNTER — Other Ambulatory Visit: Payer: Self-pay

## 2018-09-26 DIAGNOSIS — I447 Left bundle-branch block, unspecified: Secondary | ICD-10-CM | POA: Diagnosis not present

## 2018-09-26 LAB — CUP PACEART INCLINIC DEVICE CHECK
Date Time Interrogation Session: 20200721040000
Implantable Lead Implant Date: 20070309
Implantable Lead Implant Date: 20070309
Implantable Lead Implant Date: 20070309
Implantable Lead Location: 753858
Implantable Lead Location: 753859
Implantable Lead Location: 753860
Implantable Lead Model: 158
Implantable Lead Model: 4087
Implantable Lead Model: 4543
Implantable Lead Serial Number: 121652
Implantable Lead Serial Number: 168416
Implantable Lead Serial Number: 268502
Implantable Pulse Generator Implant Date: 20140219
Lead Channel Impedance Value: 392 Ohm
Lead Channel Impedance Value: 500 Ohm
Lead Channel Impedance Value: 508 Ohm
Lead Channel Pacing Threshold Amplitude: 0.9 V
Lead Channel Pacing Threshold Amplitude: 1.6 V
Lead Channel Pacing Threshold Amplitude: 3 V
Lead Channel Pacing Threshold Pulse Width: 0.4 ms
Lead Channel Pacing Threshold Pulse Width: 1 ms
Lead Channel Pacing Threshold Pulse Width: 1.2 ms
Lead Channel Sensing Intrinsic Amplitude: 15.2 mV
Lead Channel Sensing Intrinsic Amplitude: 2.2 mV
Lead Channel Sensing Intrinsic Amplitude: 5.7 mV
Lead Channel Setting Pacing Amplitude: 2 V
Lead Channel Setting Pacing Amplitude: 2.5 V
Lead Channel Setting Pacing Amplitude: 3.5 V
Lead Channel Setting Pacing Pulse Width: 1 ms
Lead Channel Setting Pacing Pulse Width: 1.2 ms
Lead Channel Setting Sensing Sensitivity: 2.5 mV
Lead Channel Setting Sensing Sensitivity: 2.5 mV
Pulse Gen Serial Number: 100543

## 2018-09-26 NOTE — Progress Notes (Signed)
ICD check in clinic. Normal device function. Thresholds and sensing consistent with previous device measurements. Impedance trends stable over time. No evidence of any ventricular arrhythmias. 3 VT-NS episode that all appear 1:1 SVT/AT. Histogram distribution appropriate for patient and level of activity. No changes made this session. Device programmed at appropriate safety margins. Pt has chronically elevated RV threshold. Device programmed to optimize intrinsic conduction. Estimated longevity 3 months. Pt refuses remote follow-up. Plan to check device every month with 3 month until ERI and elevated RV threshold which may hasten time to ERI. Patient education completed including shock plan.   Legrand Como [address]" [place], PA-C 09/26/2018 4:28 PM

## 2018-09-26 NOTE — Telephone Encounter (Signed)
Number not connected, will need to be screened at door.

## 2018-10-13 DIAGNOSIS — R5383 Other fatigue: Secondary | ICD-10-CM | POA: Diagnosis not present

## 2018-10-19 ENCOUNTER — Ambulatory Visit: Payer: Self-pay | Admitting: Internal Medicine

## 2018-10-31 ENCOUNTER — Ambulatory Visit (INDEPENDENT_AMBULATORY_CARE_PROVIDER_SITE_OTHER): Payer: Medicare Other | Admitting: *Deleted

## 2018-10-31 ENCOUNTER — Other Ambulatory Visit: Payer: Self-pay

## 2018-10-31 ENCOUNTER — Telehealth: Payer: Self-pay

## 2018-10-31 DIAGNOSIS — I420 Dilated cardiomyopathy: Secondary | ICD-10-CM | POA: Diagnosis not present

## 2018-10-31 LAB — CUP PACEART INCLINIC DEVICE CHECK
Brady Statistic RA Percent Paced: 36 %
Brady Statistic RV Percent Paced: 100 %
Date Time Interrogation Session: 20200825040000
Implantable Lead Implant Date: 20070309
Implantable Lead Implant Date: 20070309
Implantable Lead Implant Date: 20070309
Implantable Lead Location: 753858
Implantable Lead Location: 753859
Implantable Lead Location: 753860
Implantable Lead Model: 158
Implantable Lead Model: 4087
Implantable Lead Model: 4543
Implantable Lead Serial Number: 121652
Implantable Lead Serial Number: 168416
Implantable Lead Serial Number: 268502
Implantable Pulse Generator Implant Date: 20140219
Lead Channel Impedance Value: 380 Ohm
Lead Channel Impedance Value: 513 Ohm
Lead Channel Impedance Value: 608 Ohm
Lead Channel Pacing Threshold Amplitude: 0.9 V
Lead Channel Pacing Threshold Amplitude: 1.5 V
Lead Channel Pacing Threshold Amplitude: 3 V
Lead Channel Pacing Threshold Pulse Width: 0.4 ms
Lead Channel Pacing Threshold Pulse Width: 1 ms
Lead Channel Pacing Threshold Pulse Width: 1.2 ms
Lead Channel Sensing Intrinsic Amplitude: 14 mV
Lead Channel Sensing Intrinsic Amplitude: 2.2 mV
Lead Channel Sensing Intrinsic Amplitude: 5.8 mV
Lead Channel Setting Pacing Amplitude: 2 V
Lead Channel Setting Pacing Amplitude: 2.5 V
Lead Channel Setting Pacing Amplitude: 3.5 V
Lead Channel Setting Pacing Pulse Width: 1 ms
Lead Channel Setting Pacing Pulse Width: 1.2 ms
Lead Channel Setting Sensing Sensitivity: 2.5 mV
Lead Channel Setting Sensing Sensitivity: 2.5 mV
Pulse Gen Serial Number: 100543

## 2018-10-31 NOTE — Progress Notes (Signed)
ICD check in clinic. Normal device function. Thresholds and sensing consistent with previous device measurements. Impedance trends stable over time. 2  ventricular arrhythmias that appear to be NSVT the longest was 6 cycles in duration. No mode switches. Histogram distribution appropriate for patient and level of activity. No changes made this session. Device programmed at appropriate safety margins. Device programmed to optimize intrinsic conduction. Estimated longevity @ ERI. Pt declines remote f/u. Plan to check device in office  12/05/18. Patient education completed.

## 2018-10-31 NOTE — Telephone Encounter (Signed)
    COVID-19 Pre-Screening Questions:  . In the past 7 to 10 days have you had a cough,  shortness of breath, headache, congestion, fever (100 or greater) body aches, chills, sore throat, or sudden loss of taste or sense of smell? No . Have you been around anyone with known Covid 19. NO . Have you been around anyone who is awaiting Covid 19 test results in the past 7 to 10 days? NO . Have you been around anyone who has been exposed to Covid 19, or has mentioned symptoms of Covid 19 within the past 7 to 10 days? No  If you have any concerns/questions about symptoms patients report during screening (either on the phone or at threshold). Contact the provider seeing the patient or DOD for further guidance.  If neither are available contact a member of the leadership team.         Pt answered no to all covid-19 prescreening questions. I asked the pt to wear a mask. I let the pt know we are reducing the number of pt coming into the office. I told the pt if anything changes to give [place] a call. The pt verbalized understanding.

## 2018-11-02 ENCOUNTER — Ambulatory Visit (INDEPENDENT_AMBULATORY_CARE_PROVIDER_SITE_OTHER): Payer: Medicare Other | Admitting: Internal Medicine

## 2018-11-02 ENCOUNTER — Other Ambulatory Visit: Payer: Self-pay

## 2018-11-02 ENCOUNTER — Encounter: Payer: Self-pay | Admitting: Internal Medicine

## 2018-11-02 VITALS — BP 100/60 | HR 55 | Temp 97.8°F | Ht 69.0 in | Wt 125.0 lb

## 2018-11-02 DIAGNOSIS — I714 Abdominal aortic aneurysm, without rupture, unspecified: Secondary | ICD-10-CM

## 2018-11-02 DIAGNOSIS — F331 Major depressive disorder, recurrent, moderate: Secondary | ICD-10-CM

## 2018-11-02 DIAGNOSIS — Z681 Body mass index (BMI) 19 or less, adult: Secondary | ICD-10-CM | POA: Diagnosis not present

## 2018-11-02 DIAGNOSIS — E039 Hypothyroidism, unspecified: Secondary | ICD-10-CM

## 2018-11-02 DIAGNOSIS — R5383 Other fatigue: Secondary | ICD-10-CM

## 2018-11-02 DIAGNOSIS — E538 Deficiency of other specified B group vitamins: Secondary | ICD-10-CM | POA: Diagnosis not present

## 2018-11-02 DIAGNOSIS — J418 Mixed simple and mucopurulent chronic bronchitis: Secondary | ICD-10-CM | POA: Diagnosis not present

## 2018-11-02 DIAGNOSIS — R5381 Other malaise: Secondary | ICD-10-CM | POA: Diagnosis not present

## 2018-11-02 DIAGNOSIS — R636 Underweight: Secondary | ICD-10-CM | POA: Diagnosis not present

## 2018-11-02 DIAGNOSIS — E440 Moderate protein-calorie malnutrition: Secondary | ICD-10-CM | POA: Diagnosis not present

## 2018-11-02 DIAGNOSIS — I4891 Unspecified atrial fibrillation: Secondary | ICD-10-CM

## 2018-11-02 DIAGNOSIS — D649 Anemia, unspecified: Secondary | ICD-10-CM

## 2018-11-02 NOTE — Progress Notes (Signed)
Location:  [facility] clinic Provider:  Audery Wassenaar L. Mariea Clonts, D.O., C.M.D.  Goals of Care:  Advanced Directives 09/04/2018  Does Patient Have a Medical Advance Directive? Yes  Type of Paramedic of [place];Living will  Does patient want to make changes to medical advance directive? No - Patient declined  Copy of [place] in Chart? No - copy requested  Would patient like information on creating a medical advance directive? -  Pre-existing out of facility DNR order (yellow form or pink MOST form) -     Chief Complaint  Patient presents with  . Medical Management of Chronic Issues    74mth follow-up    HPI: Patient is a 85 y.o. female seen today for medical management of chronic diseases.    She is much happier now after her last hernia surgery.  She had reported her fatigue to Dr. [name] and he did labs on her.  She has been anemic.  She's had several surgeries now and her diet remains poor.  She'd also lost her husband.    She is c/o a lot of mucus.  She'll wake up with it in the morning.  Then she has her cough.  Other days she does not have it.  Takes her allegra and tylenol for this.  She thinks she needs an antibiotic.  Before her surgery, she was hoarse.  She still hears hoarseness.   She has been stiff lately.  Her back hurts her.  She's been to two back doctors.  There were trying to treat her for her kyphosis problem.  She did not have pain before when she went there like she does now.  Has been taking tylenol.    Has to get up to urinate frequently at night.  Has seen urology and gyn and has not tolerated side effects of meds.  Then can't sleep.  Does use her estrace cream.   Has to have a repeat pap smear due to abnormal cells.  She asks if she should have been getting them to begin with at her age.  She has yet to start her antidepressant.  She did not want to take something strange before her surgery.  She has wellbutrin at home.    Past  Medical History:  Diagnosis Date  . AAA (abdominal aortic aneurysm) (HCC)    2.8 cm 05/2018; 5 year [place] per ACR guidelines  . Allergy    all year   . Anxiety disorder   . Arthritis   . Barrett's esophagus 04/2008  . Benign neoplasm of colon   . Blood transfusion without reported diagnosis    long ago per pt  . Cataract    removed both eyes  . CHF (congestive heart failure) ([place])   . Chronic airway obstruction, not elsewhere classified   . Complication of anesthesia    slow to wake up  . Depressive disorder, not elsewhere classified   . Diverticulitis   . Dysthymic disorder   . Erosive gastritis 08/2004  . GERD (gastroesophageal reflux disease)   . Hair thinning   . HTN (hypertension)    pt denies   . Hyperplastic polyps of stomach 06/2002  . Hypothyroidism   . ICD (implantable cardiac defibrillator) in place    BiV/ICD; s/p removal of ICD with insertion of BiV PPM 04/26/12  . Incisional hernia   . Insomnia 08/20/2015  . Kyphosis 08/20/2015  . LBBB (left bundle branch block)   . Melanoma ([place])  right arm  . Mitral regurgitation   . Nonischemic cardiomyopathy ([place])    EF initially 20%; Last measurement up to 50% per echo in August of 2012  . Osteopenia   . Other and unspecified hyperlipidemia   . Other and unspecified hyperlipidemia   . Pacemaker 04/26/2012  . PAF (paroxysmal atrial fibrillation) ([place])    11/2016  . Pain in joint, lower leg   . Palpitations   . Pneumonia   . Restless legs syndrome (RLS)   . Synovial cyst of popliteal space   . Unspecified chronic bronchitis ([place])   . Unspecified nasal polyp   . Unspecified sinusitis (chronic)     Past Surgical History:  Procedure Laterality Date  . ABDOMINAL HYSTERECTOMY  1076   endometriosis  . APPENDECTOMY    . BI-VENTRICULAR PACEMAKER INSERTION N/A 04/26/2012   Procedure: BI-VENTRICULAR PACEMAKER INSERTION (CRT-P);  Surgeon: Evans Lance, MD;  Location: [facility] C[facility];  Service: Cardiovascular;  Laterality: N/A;   . COLONOSCOPY    . defibrillator insertion     s/p removal of previously implanted BiV ICD and insertion of a new BiV pacemaker on 04/26/12  . excise mole of lip  2001   Dr. Larena Sox  . excision of wen  1984   Dr. Parks Ranger  . FEMORAL HERNIA REPAIR  12/25/2012   Dr Dalbert Batman  . FEMORAL HERNIA REPAIR  01/04/2013   Recurrent - Dr. Lilyan Punt  . INCISIONAL HERNIA REPAIR N/A 10/01/2013   Procedure: LAPAROSCOPIC INCISIONAL HERNIA;  Surgeon: Gayland Curry, MD;  Location: [facility];  Service: General;  Laterality: N/A;  . INCISIONAL HERNIA REPAIR N/A 09/07/2018   Procedure: LAPAROSCOPIC ASSISTED REPAIR OF INCISIONAL HERNIA;  Surgeon: Greer Pickerel, MD;  Location: [facility];  Service: General;  Laterality: N/A;  . INGUINAL HERNIA REPAIR Right 09/26/2012   Procedure: RIGHT INGUINAL HERNIA REPAIR WITH MESH;  Surgeon: Odis Hollingshead, MD;  Location: [facility];  Service: General;  Laterality: Right;  . INGUINAL HERNIA REPAIR Right 12/25/2012   Procedure: explor right groin, small bowel rescection, tissue repair right femoral hernia;  Surgeon: Adin Hector, MD;  Location: [facility];  Service: General;  Laterality: Right;  . INSERTION OF MESH Right 09/26/2012   Procedure: INSERTION OF MESH;  Surgeon: Odis Hollingshead, MD;  Location: [facility];  Service: General;  Laterality: Right;  . INSERTION OF MESH N/A 10/01/2013   Procedure: INSERTION OF MESH;  Surgeon: Gayland Curry, MD;  Location: [facility];  Service: General;  Laterality: N/A;  . LAPAROSCOPY N/A 01/04/2013   Procedure: Diagnostic Laparoscopy, exploratory laparotomy with small bowel resection, closure of right femoral hernia repair;  Surgeon: Madilyn Hook, DO;  Location: [facility];  Service: General;  Laterality: N/A;  . Left arm surgery    . POLYPECTOMY    . RIGHT BREAST LUMPECTOMY  1988   Dr. Marylene Buerger  . right knee surgery     arthroscopy: Dr. Shellia Carwin  . TONSILLECTOMY    . UPPER GASTROINTESTINAL ENDOSCOPY      Allergies  Allergen Reactions  . Hydrocodone  Itching  . Cephalexin Itching and Swelling  . Ciprofloxacin     Not indicated due to aneurysm in aorta    . Doxycycline Other (See Comments)    Per pt: unknown  . Escitalopram     Dry mouth  . Levofloxacin     Not indicated due to aneurysm in aorta   . Moxifloxacin     Not indicated due to aneurysm in aorta   .  Ofloxacin Other (See Comments)    Per pt: unknown  . Sertraline     insomnia  . Sulfamethoxazole Hives    Any sulfa meds.  . Tape Other (See Comments)    Burns skin.   . Latex Rash    "If pt. Makes contact with or wearing"  . Neomycin Rash    Outpatient Encounter Medications as of 11/02/2018  Medication Sig  . acetaminophen (TYLENOL) 500 MG tablet Take 1,000 mg by mouth every 8 (eight) hours as needed. For pain   . Ascorbic Acid (VITAMIN C) 1000 MG tablet Take 1,000 mg by mouth daily.  . Biotin 5000 MCG TABS Take 1 tablet by mouth daily.  Marland Kitchen buPROPion (WELLBUTRIN XL) 150 MG 24 hr tablet TAKE 1 TABLET BY MOUTH EVERY DAY  . Calcium Carbonate-Vitamin D (CALCIUM + D PO) Take 2 tablets by mouth daily.   . Cyanocobalamin (VITAMIN B 12) 500 MCG TABS Take 1,000 mcg by mouth daily.   Marland Kitchen estradiol (ESTRACE) 0.1 MG/GM vaginal cream Place 1 g vaginally 2 (two) times a week.  . fexofenadine (ALLEGRA) 60 MG tablet Take 60 mg by mouth daily as needed for allergies.  . furosemide (LASIX) 20 MG tablet Take 1 tablet (20 mg total) by mouth daily as needed for fluid or edema.  Marland Kitchen guaiFENesin (MUCINEX) 600 MG 12 hr tablet Take 600 mg by mouth daily as needed (nasal congestion).  . Magnesium 250 MG TABS Take 250 mg by mouth daily.   . Multiple Vitamin (MULTIVITAMIN WITH MINERALS) TABS Take 1 tablet by mouth daily.  . potassium chloride SA (K-DUR) 20 MEQ tablet Take 1 tablet (20 mEq total) by mouth daily as needed (only with lasix).  . RABEprazole (ACIPHEX) 20 MG tablet Take one tablet by mouth once daily for stomach  . rivaroxaban (XARELTO) 20 MG TABS tablet Take 1 tablet (20 mg total) by mouth  daily with supper.  Marland Kitchen SYNTHROID 100 MCG tablet TAKE 1 TABLET (100 MCG TOTAL) BY MOUTH DAILY BEFORE BREAKFAST.  Marland Kitchen trandolapril (MAVIK) 2 MG tablet TAKE 1 TABLET BY MOUTH TWICE A DAY  . [DISCONTINUED] traMADol (ULTRAM) 50 MG tablet Take 1 tablet (50 mg total) by mouth every 6 (six) hours as needed (mild pain).   Facility-Administered Encounter Medications as of 11/02/2018  Medication  . 0.9 %  sodium chloride infusion    Review of Systems:  Review of Systems  Constitutional: Positive for malaise/fatigue. Negative for chills and fever.       Weight stable at 125 lbs  HENT: Positive for congestion. Negative for sore throat.   Eyes: Negative for blurred vision.  Respiratory: Positive for cough. Negative for shortness of breath.   Cardiovascular: Negative for chest pain, palpitations and leg swelling.  Gastrointestinal: Negative for abdominal pain, blood in stool, constipation, diarrhea and melena.  Genitourinary: Positive for frequency. Negative for dysuria, hematuria and urgency.  Musculoskeletal: Positive for back pain. Negative for falls.  Skin: Negative for itching and rash.  Neurological: Negative for dizziness and loss of consciousness.  Endo/Heme/Allergies: Positive for environmental allergies. Bruises/bleeds easily.  Psychiatric/Behavioral: Positive for depression. Negative for memory loss. The patient is nervous/anxious. The patient does not have insomnia.     Health Maintenance  Topic Date Due  . PNA vac Low Risk Adult (2 of 2 - PCV13) 03/13/2009  . MAMMOGRAM  09/14/2018  . INFLUENZA VACCINE  10/07/2018  . TETANUS/TDAP  03/15/2021  . DEXA SCAN  Completed    Physical Exam: Vitals:   11/02/18  1500  BP: 100/60  Pulse: (!) 55  Temp: 97.8 F (36.6 C)  TempSrc: Temporal  SpO2: 96%  Weight: 125 lb (56.7 kg)  Height: 5\' 9"  (1.753 m)   Body mass index is 18.46 kg/m. Physical Exam Vitals signs reviewed.  Constitutional:      General: She is not in acute distress.     Appearance: She is not toxic-appearing.     Comments: Underweight, thin female  HENT:     Head: Normocephalic and atraumatic.     Nose:     Comments: Deferred due to covid masking    Mouth/Throat:     Comments: Deferred due to covid masking Eyes:     Extraocular Movements: Extraocular movements intact.     Conjunctiva/sclera: Conjunctivae normal.     Pupils: Pupils are equal, round, and reactive to light.     Comments: Uses reading glasses  Cardiovascular:     Rate and Rhythm: Normal rate and regular rhythm.     Pulses: Normal pulses.  Pulmonary:     Effort: Pulmonary effort is normal.     Breath sounds: Normal breath sounds. No wheezing.  Abdominal:     General: Bowel sounds are normal. There is no distension.     Palpations: Abdomen is soft.     Tenderness: There is no abdominal tenderness. There is no guarding or rebound.  Musculoskeletal: Normal range of motion.     Right lower leg: No edema.     Left lower leg: No edema.     Comments: kyphosis  Skin:    General: Skin is warm and dry.     Capillary Refill: Capillary refill takes less than 2 seconds.  Neurological:     General: No focal deficit present.     Mental Status: She is alert and oriented to person, place, and time.  Psychiatric:        Mood and Affect: Mood normal.     Comments: Remains the same--still seems depressed and has not taken wellbutrin prescribed end of October last year     Labs reviewed: Basic Metabolic Panel: Recent Labs    12/08/17 1609 04/20/18 1612 09/04/18 1354  NA  --  137 136  K  --  4.5 4.7  CL  --  104 105  CO2  --  25 24  GLUCOSE  --  84 96  BUN  --  29* 36*  CREATININE  --  0.75 0.84  CALCIUM  --  9.1 9.5  TSH 0.75 1.82  --    Liver Function Tests: Recent Labs    04/20/18 1612  AST 20  ALT 15  BILITOT 0.3  PROT 6.2   No results for input(s): LIPASE, AMYLASE in the last 8760 hours. No results for input(s): AMMONIA in the last 8760 hours. CBC: Recent Labs     12/08/17 1609 04/20/18 1612 09/04/18 1354  WBC 7.2 7.0 6.5  NEUTROABS 4,370 4,459  --   HGB 11.5* 10.7* 10.8*  HCT 33.8* 32.0* 33.8*  MCV 89.7 87.7 88.9  PLT 189 215 186   Lipid Panel: No results for input(s): CHOL, HDL, LDLCALC, TRIG, CHOLHDL, LDLDIRECT in the last 8760 hours. No results found for: HGBA1C   Assessment/Plan 1. Underweight -remains an issue, not a fan of supplement shakes and has not really changed diet with lots of advice -f/u labs due to anemia - Vitamin B12 - Iron, TIBC and Ferritin Panel  2. Body mass index (BMI) of 19 or less in adult -  remains low, makes poor food choices -educated on incorporating healthy veggies into diet especially greens with her anemia  3. Mixed simple and mucopurulent chronic bronchitis ([place]) -clear lungs today, suspect this is a big contributor to her mucus along with postnasal drip -encouraged regular use of her allegra and flonase for the allergy component--has not been consistent about the nasal spray--reminded about proper use of the spray toward lateral nostrils not septum to avoid bleeding  4. AAA (abdominal aortic aneurysm) without rupture (HCC) -ectatic aorta 2.8 cm infrarenally seen on CT in march of this year, f/u US in 5 years was recommended  5. Atrial fibrillation, unspecified type ([place]) -rate controlled w/o meds, continues on xarelto anticoagulation  6. Hypothyroidism, unspecified type -continue levothyroxine, tsh has been in nl range  7. Malaise and fatigue -seems due to mix of depression and poor diet - Vitamin B12 - Iron, TIBC and Ferritin Panel  8. Moderate malnutrition ([place]) -again counseled on healthy food choices to improve her weight and energy  9. Moderate episode of recurrent major depressive disorder (HCC) -still has not taken the wellbutrin I prescribed -still has same apathy, low energy, malaise, no pep, and continues to want all of her chronic conditions fixed rather than improved  10. Anemia,  unspecified type -mild and likely diet-related because she describes a diet without much healthy vegetables in it - Vitamin B12 - Iron, TIBC and Ferritin Panel  11. B12 deficiency due to diet - suspect, will check level - Vitamin B12  Labs/tests ordered:   Lab Orders     Vitamin B12     Iron, TIBC and Ferritin Panel  Next appt:  03/08/2019 med mgt, labs today, none before visit  [place]. Justyn Boyson, D.O. [facility] [address], [place] [address] Cell Phone (Mon-Fri 8am-5pm):  [phone] On Call:  [phone] & follow prompts after 5pm & weekends Office Phone:  [phone] Office Fax:  [phone]

## 2018-11-02 NOTE — Patient Instructions (Signed)
Let's check if you are iron or B12 deficient.  We'll replace those if needed with vitamins.  Please take the wellbutrin, I've prescribed.

## 2018-11-03 ENCOUNTER — Telehealth: Payer: Self-pay | Admitting: *Deleted

## 2018-11-03 LAB — IRON,TIBC AND FERRITIN PANEL
%SAT: 11 % (calc) — ABNORMAL LOW (ref 16–45)
Ferritin: 19 ng/mL (ref 16–288)
Iron: 41 ug/dL — ABNORMAL LOW (ref 45–160)
TIBC: 388 mcg/dL (calc) (ref 250–450)

## 2018-11-03 LAB — VITAMIN B12: Vitamin B-12: 1326 pg/mL — ABNORMAL HIGH (ref 200–1100)

## 2018-11-03 MED ORDER — POLYSACCHARIDE IRON COMPLEX 150 MG PO CAPS: 150.0000 mg | ORAL_CAPSULE | Freq: Every day | ORAL | 1 refills | Status: DC

## 2018-11-03 NOTE — Telephone Encounter (Signed)
Spoke with patient and advised results rx sent to pharmacy by e-script  

## 2018-11-03 NOTE — Telephone Encounter (Signed)
-----   Message from Gayland Curry, DO sent at 11/03/2018 10:27 AM EDT ----- She has some mild iron deficiency which I suspect is diet related in her case.  I recommend increased intake of spinach and low fat beef products (93% or 97% lean), other healthy greens like kale, broccoli, swiss chard, etc. That are high in iron.  She can also google high iron foods.  If she really does not think she can eat those items, then we could start her on nu-iron 150mg  po daily.  Please call her to find out what she wants to do.  We can order the iron if she does not think she'll eat enough of it.

## 2018-11-10 DIAGNOSIS — Z1231 Encounter for screening mammogram for malignant neoplasm of breast: Secondary | ICD-10-CM | POA: Diagnosis not present

## 2018-11-10 DIAGNOSIS — Z803 Family history of malignant neoplasm of breast: Secondary | ICD-10-CM | POA: Diagnosis not present

## 2018-11-10 LAB — HM MAMMOGRAPHY

## 2018-11-15 ENCOUNTER — Encounter: Payer: Self-pay | Admitting: *Deleted

## 2018-12-05 ENCOUNTER — Encounter (INDEPENDENT_AMBULATORY_CARE_PROVIDER_SITE_OTHER): Payer: Self-pay

## 2018-12-05 ENCOUNTER — Ambulatory Visit (INDEPENDENT_AMBULATORY_CARE_PROVIDER_SITE_OTHER): Payer: Medicare Other | Admitting: Student

## 2018-12-05 ENCOUNTER — Other Ambulatory Visit: Payer: Self-pay

## 2018-12-05 DIAGNOSIS — I447 Left bundle-branch block, unspecified: Secondary | ICD-10-CM

## 2018-12-05 LAB — CUP PACEART INCLINIC DEVICE CHECK
Date Time Interrogation Session: 20200929040000
Implantable Lead Implant Date: 20070309
Implantable Lead Implant Date: 20070309
Implantable Lead Implant Date: 20070309
Implantable Lead Location: 753858
Implantable Lead Location: 753859
Implantable Lead Location: 753860
Implantable Lead Model: 158
Implantable Lead Model: 4087
Implantable Lead Model: 4543
Implantable Lead Serial Number: 121652
Implantable Lead Serial Number: 168416
Implantable Lead Serial Number: 268502
Implantable Pulse Generator Implant Date: 20140219
Lead Channel Impedance Value: 392 Ohm
Lead Channel Impedance Value: 486 Ohm
Lead Channel Impedance Value: 507 Ohm
Lead Channel Pacing Threshold Amplitude: 0.9 V
Lead Channel Pacing Threshold Amplitude: 1.7 V
Lead Channel Pacing Threshold Amplitude: 3 V
Lead Channel Pacing Threshold Pulse Width: 0.4 ms
Lead Channel Pacing Threshold Pulse Width: 1 ms
Lead Channel Pacing Threshold Pulse Width: 1.2 ms
Lead Channel Sensing Intrinsic Amplitude: 1.8 mV
Lead Channel Sensing Intrinsic Amplitude: 14.1 mV
Lead Channel Sensing Intrinsic Amplitude: 6.3 mV
Lead Channel Setting Pacing Amplitude: 2 V
Lead Channel Setting Pacing Amplitude: 2.5 V
Lead Channel Setting Pacing Amplitude: 3.5 V
Lead Channel Setting Pacing Pulse Width: 1 ms
Lead Channel Setting Pacing Pulse Width: 1.2 ms
Lead Channel Setting Sensing Sensitivity: 2.5 mV
Lead Channel Setting Sensing Sensitivity: 2.5 mV
Pulse Gen Serial Number: 100543

## 2018-12-08 ENCOUNTER — Other Ambulatory Visit: Payer: Self-pay | Admitting: Internal Medicine

## 2018-12-08 DIAGNOSIS — F331 Major depressive disorder, recurrent, moderate: Secondary | ICD-10-CM

## 2018-12-08 NOTE — Telephone Encounter (Signed)
I hope she does this time.

## 2018-12-08 NOTE — Telephone Encounter (Signed)
See comment from pharmacist, patient never started this medication last year and would like to start now   Last appointment was 11/02/2018  Next appointment 03/08/2019

## 2018-12-11 ENCOUNTER — Telehealth: Payer: Self-pay | Admitting: *Deleted

## 2018-12-11 NOTE — Telephone Encounter (Signed)
Patient called and wanted to know if she should continue taking the Iron supplement Niferex. Stated that she is about to complete the 30 days worth and wants to know if she should continue the medication. Please Advise.

## 2018-12-11 NOTE — Telephone Encounter (Signed)
Patient notified. Patient stated to leave message if she was not at home.

## 2018-12-11 NOTE — Telephone Encounter (Signed)
Yes, she can continue.  We will reassess her blood counts and iron panel when I see her next.

## 2018-12-13 DIAGNOSIS — Z85820 Personal history of malignant melanoma of skin: Secondary | ICD-10-CM | POA: Diagnosis not present

## 2018-12-13 DIAGNOSIS — L820 Inflamed seborrheic keratosis: Secondary | ICD-10-CM | POA: Diagnosis not present

## 2018-12-13 DIAGNOSIS — D485 Neoplasm of uncertain behavior of skin: Secondary | ICD-10-CM | POA: Diagnosis not present

## 2018-12-20 DIAGNOSIS — R87610 Atypical squamous cells of undetermined significance on cytologic smear of cervix (ASC-US): Secondary | ICD-10-CM | POA: Diagnosis not present

## 2018-12-20 DIAGNOSIS — R87612 Low grade squamous intraepithelial lesion on cytologic smear of cervix (LGSIL): Secondary | ICD-10-CM | POA: Diagnosis not present

## 2019-01-02 ENCOUNTER — Other Ambulatory Visit: Payer: Self-pay | Admitting: Internal Medicine

## 2019-01-03 ENCOUNTER — Other Ambulatory Visit: Payer: Self-pay | Admitting: Internal Medicine

## 2019-01-03 DIAGNOSIS — F331 Major depressive disorder, recurrent, moderate: Secondary | ICD-10-CM

## 2019-01-11 ENCOUNTER — Other Ambulatory Visit: Payer: Self-pay

## 2019-01-11 ENCOUNTER — Ambulatory Visit (INDEPENDENT_AMBULATORY_CARE_PROVIDER_SITE_OTHER): Payer: Medicare Other | Admitting: Physician Assistant

## 2019-01-11 VITALS — BP 108/52 | HR 55 | Ht 69.0 in | Wt 121.0 lb

## 2019-01-11 DIAGNOSIS — I420 Dilated cardiomyopathy: Secondary | ICD-10-CM | POA: Diagnosis not present

## 2019-01-11 DIAGNOSIS — Z45010 Encounter for checking and testing of cardiac pacemaker pulse generator [battery]: Secondary | ICD-10-CM | POA: Diagnosis not present

## 2019-01-11 DIAGNOSIS — I5022 Chronic systolic (congestive) heart failure: Secondary | ICD-10-CM | POA: Diagnosis not present

## 2019-01-11 DIAGNOSIS — I10 Essential (primary) hypertension: Secondary | ICD-10-CM | POA: Diagnosis not present

## 2019-01-11 DIAGNOSIS — I480 Paroxysmal atrial fibrillation: Secondary | ICD-10-CM

## 2019-01-11 NOTE — Patient Instructions (Addendum)
Medication Instructions:   Your physician recommends that you continue on your current medications as directed. Please refer to the Current Medication list given to you today.   *If you need a refill on your cardiac medications before your next appointment, please call your pharmacy*  Lab Work:  CBC BMET TODAY   If you have labs (blood work) drawn today and your tests are completely normal, you will receive your results only by: Marland Kitchen MyChart Message (if you have MyChart) OR . A paper copy in the mail If you have any lab test that is abnormal or we need to change your treatment, we will call you to review the results.  Testing/Procedures: NONE ORDERED  TODAY   Follow-Up: At [facility], you and your health needs are our priority.  As part of our continuing mission to provide you with exceptional heart care, we have created designated Provider Care Teams.  These Care Teams include your primary Cardiologist (physician) and Advanced Practice Providers (APPs -  Physician Assistants and Nurse Practitioners) who all work together to provide you with the care you need, when you need it.  Your next appointment:   FOR GEN [facility] OUT CONTACT OFFICE BACK TO SPEAK WITH NURSE  JENNY DR TAYLOR NURSE  IF UNAVAILABLE ASK FOR RENEE URSUY CMA Lamerle Jabs WITH DATE AVAILABLE TO HAVE ERI GN CHANGE OUT TO SET UP WITH INSTRUCTIONS  DATES AVAILABLE   11-9    ARRIVAL TIME 7:30 AM  FOR 9:30 PROCEDURE  11-16  ARRIVAL TIME 9:30 AM  FOR 11:30 PROCEDURE  11-20  ARRIVAL TIME 9:30 AM  FOR 11:30 PROCEDURE    Other Instructions

## 2019-01-11 NOTE — Progress Notes (Signed)
Cardiology Office Note Date:  01/11/2019  Patient ID:  [name], [name] [birth year], MRN [id number] PCP:  Gayland Curry, DO  Cardiologist:  Dr. [name] Electrophysiologist: Dr. Lovena Le   Chief Complaint:  Battery check  History of Present Illness: [name] is a 85 y.o. female with history of NICM, w/ CRT-P, LBBB, HTN, hypothyroidism, melanoma, anxiety/depressionsmall infrarenal AAA, following w/vascular surgery,  chronic abd discomfort after a number of abd surgeries,  PAFib.  She comes in today to be seen for dr. Lovena Le.  Last seen by him Feb 2020, at that time mentions known chronic elevated RV thresholds. She was nearing ERI at that time, no changes were made to her tx or programming.  More recently she saw A. Tiller, PA in device clinic, again noted chronically elevated RV threshold, no true VT.  Battery estimate was 3 months, pt declined remote monitoring planned for monthly battery checks. This is why she is here today  She is doing OK, no CP, palpitations, no SOB.  No dizziness, near syncope or syncope.  She very anxious about having a surgery done.    Doing OK with her xarelto, no bleeding or signs of bleeding  She has been seeing her PMD for some unintentional weight loss, inability to get weight on.  Device History: BSCi CRT-P, 04/26/12, original device (ICD) implanted 05/14/05,  Dr. Lovena Le   Past Medical History:  Diagnosis Date  . AAA (abdominal aortic aneurysm) (HCC)    2.8 cm 05/2018; 5 year [place] per ACR guidelines  . Allergy    all year   . Anxiety disorder   . Arthritis   . Barrett's esophagus 04/2008  . Benign neoplasm of colon   . Blood transfusion without reported diagnosis    long ago per pt  . Cataract    removed both eyes  . CHF (congestive heart failure) ([place])   . Chronic airway obstruction, not elsewhere classified   . Complication of anesthesia    slow to wake up  . Depressive disorder, not elsewhere classified   . Diverticulitis   .  Dysthymic disorder   . Erosive gastritis 08/2004  . GERD (gastroesophageal reflux disease)   . Hair thinning   . HTN (hypertension)    pt denies   . Hyperplastic polyps of stomach 06/2002  . Hypothyroidism   . ICD (implantable cardiac defibrillator) in place    BiV/ICD; s/p removal of ICD with insertion of BiV PPM 04/26/12  . Incisional hernia   . Insomnia 08/20/2015  . Kyphosis 08/20/2015  . LBBB (left bundle branch block)   . Melanoma ([place])    right arm  . Mitral regurgitation   . Nonischemic cardiomyopathy ([place])    EF initially 20%; Last measurement up to 50% per echo in August of 2012  . Osteopenia   . Other and unspecified hyperlipidemia   . Other and unspecified hyperlipidemia   . Pacemaker 04/26/2012  . PAF (paroxysmal atrial fibrillation) ([place])    11/2016  . Pain in joint, lower leg   . Palpitations   . Pneumonia   . Restless legs syndrome (RLS)   . Synovial cyst of popliteal space   . Unspecified chronic bronchitis ([place])   . Unspecified nasal polyp   . Unspecified sinusitis (chronic)     Past Surgical History:  Procedure Laterality Date  . ABDOMINAL HYSTERECTOMY  1076   endometriosis  . APPENDECTOMY    . BI-VENTRICULAR PACEMAKER INSERTION N/A 04/26/2012   Procedure: BI-VENTRICULAR PACEMAKER INSERTION (  CRT-P);  Surgeon: Evans Lance, MD;  Location: [facility] CATH LAB;  Service: Cardiovascular;  Laterality: N/A;  . COLONOSCOPY    . defibrillator insertion     s/p removal of previously implanted BiV ICD and insertion of a new BiV pacemaker on 04/26/12  . excise mole of lip  2001   Dr. Larena Sox  . excision of wen  1984   Dr. Parks Ranger  . FEMORAL HERNIA REPAIR  12/25/2012   Dr Dalbert Batman  . FEMORAL HERNIA REPAIR  01/04/2013   Recurrent - Dr. Lilyan Punt  . INCISIONAL HERNIA REPAIR N/A 10/01/2013   Procedure: LAPAROSCOPIC INCISIONAL HERNIA;  Surgeon: Gayland Curry, MD;  [facility];  Service: General;  Laterality: N/A;  . INCISIONAL HERNIA REPAIR N/A 09/07/2018   Procedure:  LAPAROSCOPIC ASSISTED REPAIR OF INCISIONAL HERNIA;  Surgeon: Greer Pickerel, MD;  Location: [place];  Service: General;  Laterality: N/A;  . INGUINAL HERNIA REPAIR Right 09/26/2012   Procedure: RIGHT INGUINAL HERNIA REPAIR WITH MESH;  Surgeon: Odis Hollingshead, MD;  Location: [place];  Service: General;  Laterality: Right;  . INGUINAL HERNIA REPAIR Right 12/25/2012   Procedure: explor right groin, small bowel rescection, tissue repair right femoral hernia;  Surgeon: Adin Hector, MD;  [facility];  Service: General;  Laterality: Right;  . INSERTION OF MESH Right 09/26/2012   Procedure: INSERTION OF MESH;  Surgeon: Odis Hollingshead, MD;  Location: [place];  Service: General;  Laterality: Right;  . INSERTION OF MESH N/A 10/01/2013   Procedure: INSERTION OF MESH;  Surgeon: Gayland Curry, MD;  [facility];  Service: General;  Laterality: N/A;  . LAPAROSCOPY N/A 01/04/2013   Procedure: Diagnostic Laparoscopy, exploratory laparotomy with small bowel resection, closure of right femoral hernia repair;  Surgeon: Madilyn Hook, DO;  [facility];  Service: General;  Laterality: N/A;  . Left arm surgery    . POLYPECTOMY    . RIGHT BREAST LUMPECTOMY  1988   Dr. Marylene Buerger  . right knee surgery     arthroscopy: Dr. Shellia Carwin  . TONSILLECTOMY    . UPPER GASTROINTESTINAL ENDOSCOPY      Current Outpatient Medications  Medication Sig Dispense Refill  . acetaminophen (TYLENOL) 500 MG tablet Take 1,000 mg by mouth every 8 (eight) hours as needed. For pain     . Ascorbic Acid (VITAMIN C) 1000 MG tablet Take 1,000 mg by mouth daily.    . Biotin 5000 MCG TABS Take 1 tablet by mouth daily.    Marland Kitchen buPROPion (WELLBUTRIN XL) 150 MG 24 hr tablet TAKE 1 TABLET BY MOUTH EVERY DAY 90 tablet 2  . Calcium Carbonate-Vitamin D (CALCIUM + D PO) Take 2 tablets by mouth daily.     . Cyanocobalamin (VITAMIN B 12) 500 MCG TABS Take 1,000 mcg by mouth daily.     Marland Kitchen estradiol (ESTRACE) 0.1 MG/GM vaginal cream Place 1 g  vaginally 2 (two) times a week.    Marland Kitchen FERREX 150 150 MG capsule TAKE 1 CAPSULE BY MOUTH EVERY DAY 30 capsule 1  . fexofenadine (ALLEGRA) 60 MG tablet Take 60 mg by mouth daily as needed for allergies.    . furosemide (LASIX) 20 MG tablet Take 1 tablet (20 mg total) by mouth daily as needed for fluid or edema. 30 tablet 6  . guaiFENesin (MUCINEX) 600 MG 12 hr tablet Take 600 mg by mouth daily as needed (nasal congestion).    . Magnesium 250 MG TABS Take 250 mg by  mouth daily.     . Multiple Vitamin (MULTIVITAMIN WITH MINERALS) TABS Take 1 tablet by mouth daily.    . potassium chloride SA (K-DUR) 20 MEQ tablet Take 1 tablet (20 mEq total) by mouth daily as needed (only with lasix). 30 tablet 6  . RABEprazole (ACIPHEX) 20 MG tablet Take one tablet by mouth once daily for stomach 90 tablet 1  . rivaroxaban (XARELTO) 20 MG TABS tablet Take 1 tablet (20 mg total) by mouth daily with supper. 90 tablet 3  . SYNTHROID 100 MCG tablet TAKE 1 TABLET BY MOUTH DAILY BEFORE BREAKFAST 90 tablet 1  . trandolapril (MAVIK) 2 MG tablet TAKE 1 TABLET BY MOUTH TWICE A DAY 180 tablet 3   Current Facility-Administered Medications  Medication Dose Route Frequency Provider Last Rate Last Dose  . 0.9 %  sodium chloride infusion  500 mL Intravenous Once Milus Banister, MD        Allergies:   Hydrocodone, Cephalexin, Ciprofloxacin, Doxycycline, Escitalopram, Levofloxacin, Moxifloxacin, Ofloxacin, Sertraline, Sulfamethoxazole, Tape, Latex, and Neomycin   Social History:  The patient  reports that she quit smoking about 25 years ago. Her smoking use included cigarettes. She has a 15.00 pack-year smoking history. She has never used smokeless tobacco. She reports that she does not drink alcohol or use drugs.   Family History:  The patient's family history includes Breast cancer in her maternal aunt; Colon cancer in her cousin and paternal uncle; Heart disease in an other family member; Stroke in her father.  ROS:  Please  see the history of present illness.  All other systems are reviewed and otherwise negative.   PHYSICAL EXAM:  VS:  BP (!) 108/52   Pulse (!) 55   Ht 5\' 9"  (1.753 m)   Wt 121 lb (54.9 kg)   SpO2 99%   BMI 17.87 kg/m  BMI: Body mass index is 17.87 kg/m. Very thin, fragile body habitus,  in no acute distress, appears younger then her age  42: normocephalic, atraumatic  Neck: no JVD, carotid bruits or masses Cardiac:  RRR; no significant murmurs, no rubs, or gallops Lungs:  CTA b/l, no wheezing, rhonchi or rales  Abd: soft, nontender MS: no deformity, advanced atrophy even for her age Ext:  no edema  Skin: warm and dry, no rash Neuro:  No gross deficits appreciated Psych: euthymic mood, full affect  PPM site is stable, no tethering or discomfort, very thin, no erosion   EKG:  Not done today Device interrogation today and reviewed by myself:  ERI reached 12/09/2018 RV lead threshold known to be chronically high, otherwise lead measurements look good SB 50's today underlying 99% RV/LV paced 31% AP   07/22/16: TTE Study Conclusions - Left ventricle: The cavity size was mildly dilated. Wall   thickness was normal. Systolic function was normal. The estimated   ejection fraction was in the range of 50% to 55%. Wall motion was   normal; there were no regional wall motion abnormalities. - Aortic valve: There was trivial regurgitation. - Mitral valve: There was mild regurgitation. - Atrial septum: No defect or patent foramen ovale was identified.    07/17/15: Echocardiogram Study Conclusions - Left ventricle: The cavity size was normal. Systolic function was   normal. Wall motion was normal; there were no regional wall   motion abnormalities. - Aortic valve: There was trivial regurgitation. - Mitral valve: There was mild regurgitation. - Left atrium: The atrium was mildly dilated.  Recent Labs: 04/20/2018: ALT 15; TSH  1.82 09/04/2018: BUN 36; Creatinine, Ser 0.84;  Hemoglobin 10.8; Platelets 186; Potassium 4.7; Sodium 136  No results found for requested labs within last 8760 hours.   CrCl cannot be calculated (Patient's most recent lab result is older than the maximum 21 days allowed.).   Wt Readings from Last 3 Encounters:  01/11/19 121 lb (54.9 kg)  11/02/18 125 lb (56.7 kg)  09/07/18 125 lb (56.7 kg)     Other studies reviewed: Additional studies/records reviewed today include: summarized above  ASSESSMENT AND PLAN:   1. New PAFib     CHA2DS2Vasc is at least 4, on Xarelto      Her Calc Creat Cl using today's weight, last available Creat is 45     No bleeding reported.        Will see how her labs look, likely we w ill need to reduce her Xarelto dose  2. Hx NICM  3. LBBB     normalized EF device downgraded to CRT-P     No symptoms or exam findings to suggest volume OL  4. HTN     Stable  5. PPM     ERI reached 12/09/2018      I discussed with her generator change procedure, potential risks and benefits., outpatient procedure. Discussed RV lead, though I do not anticipate plans for new RV lead at procedure, discussed the possibility should lead measurements at time of procedure dictate the need.  Discussed this procedure, potential benefits and risks as well, and potential overnight stay.  She very much want to have sedation, so she took the available procedure dates in the next 2 weeks to see which works best for a ride/friend to be able to help her.   Disposition: CRT-P generator change, she will call Sonia Baller (Dr. Tanna Furry RN) once she has chosen a date, my MA will follow up with the patient tomorrow.  Usual post procedure follow up.  She sees Dr. [name] soon as well.  Current medicines are reviewed at length with the patient today.  The patient did not have any concerns regarding medicines.  Haywood Lasso, PA-C 01/11/2019 6:22 PM     [address] [place] [address] [phone]  (office)  [phone] (fax)

## 2019-01-12 ENCOUNTER — Telehealth: Payer: Self-pay | Admitting: Internal Medicine

## 2019-01-12 LAB — BASIC METABOLIC PANEL
BUN/Creatinine Ratio: 38 — ABNORMAL HIGH (ref 12–28)
BUN: 39 mg/dL — ABNORMAL HIGH (ref 8–27)
CO2: 22 mmol/L (ref 20–29)
Calcium: 9.2 mg/dL (ref 8.7–10.3)
Chloride: 101 mmol/L (ref 96–106)
Creatinine, Ser: 1.02 mg/dL — ABNORMAL HIGH (ref 0.57–1.00)
GFR calc Af Amer: 58 mL/min/{1.73_m2} — ABNORMAL LOW (ref >59)
GFR calc non Af Amer: 51 mL/min/{1.73_m2} — ABNORMAL LOW (ref >59)
Glucose: 89 mg/dL (ref 65–99)
Potassium: 4.9 mmol/L (ref 3.5–5.2)
Sodium: 136 mmol/L (ref 134–144)

## 2019-01-12 LAB — CBC
Hematocrit: 33.7 % — ABNORMAL LOW (ref 34.0–46.6)
Hemoglobin: 11.3 g/dL (ref 11.1–15.9)
MCH: 29.2 pg (ref 26.6–33.0)
MCHC: 33.5 g/dL (ref 31.5–35.7)
MCV: 87 fL (ref 79–97)
Platelets: 204 10*3/uL (ref 150–450)
RBC: 3.87 x10E6/uL (ref 3.77–5.28)
RDW: 13.7 % (ref 11.7–15.4)
WBC: 6.9 10*3/uL (ref 3.4–10.8)

## 2019-01-12 NOTE — Telephone Encounter (Signed)
Pt wanted to speak with Sonia Baller to schedule some tests. Please call back

## 2019-01-15 ENCOUNTER — Encounter: Payer: Self-pay | Admitting: *Deleted

## 2019-01-15 ENCOUNTER — Telehealth: Payer: Self-pay | Admitting: Internal Medicine

## 2019-01-15 NOTE — Telephone Encounter (Signed)
Pt scheduled for gen change on 01/26/2019 at 11:30 am.  Need to confirm if Dr. Lovena Le wants to revise lead.  All instructions given  Need to confirm potential lead revision-notify cath lab/enter orders/complete instruction letter  Will plan for Pt to receive instruction letter from Dr. [name] at upcoming appt

## 2019-01-15 NOTE — Telephone Encounter (Signed)
New Message    Pt is returning a call for Shana    Please call back

## 2019-01-16 NOTE — Telephone Encounter (Signed)
Call placed Pt.  Advised she would not require lead revision.  Plan for gen change only on 11/20 at 11:30 am  Pt indicates understanding.  Understands she will not need to stay the night.

## 2019-01-20 NOTE — Progress Notes (Signed)
[name] Date of Birth: 27-Sep-1932 Medical Record [id number]  History of Present Illness: [name] is seen today for follow up. She has multiple medical issues which include a cardiomyopathy, underlying BiV/ICD which resulted in improvement of her EF, LBBB, HTN, thyroid disease, melanoma, anxiety/depression and chronic systolic HF.   In September 2018 she was noted in device clinic to have paroxysmal Afib with controlled rate. She was started on Eliquis. [place] Vasc score of at least 4. She developed itching on Eliquis and switched to Xarelto. Itching did not go away so she was switched to Coumadin. After about a month the itching resolved.  She was later switched back to Xarelto without rash. Last ICD check in September was satisfactory but she is at [facility]. Plan for generator change out with Dr Lovena Le on Friday.   On follow up today she has a lot of complaints but really doing quite well from a cardiac standpoint. No dyspnea, chest pain,  palpitations. Notes bruising on her arms. Is concerned that she is too skinny. Eats well.     Current Outpatient Medications  Medication Sig Dispense Refill  . acetaminophen (TYLENOL) 500 MG tablet Take 1,000 mg by mouth every 8 (eight) hours as needed. For pain     . Ascorbic Acid (VITAMIN C) 1000 MG tablet Take 1,000 mg by mouth at bedtime.     . Biotin 5000 MCG TABS Take 1 tablet by mouth daily.    Marland Kitchen buPROPion (WELLBUTRIN XL) 150 MG 24 hr tablet TAKE 1 TABLET BY MOUTH EVERY DAY 90 tablet 2  . Calcium Carbonate-Vitamin D (CALCIUM + D PO) Take 2 tablets by mouth daily. 1000 units Vitamin D 1200 mg calcium    . Cyanocobalamin (VITAMIN B 12) 500 MCG TABS Take 1,000 mcg by mouth daily.     Marland Kitchen estradiol (ESTRACE) 0.1 MG/GM vaginal cream Place 1 g vaginally 2 (two) times a week.    Marland Kitchen FERREX 150 150 MG capsule TAKE 1 CAPSULE BY MOUTH EVERY DAY 30 capsule 1  . fexofenadine (ALLEGRA) 180 MG tablet Take 180 mg by mouth daily as needed for allergies.     .  folic acid (FOLVITE) [id number] MCG tablet Take 400 mcg by mouth daily as needed (restless leg).    . furosemide (LASIX) 20 MG tablet Take 1 tablet (20 mg total) by mouth daily as needed for fluid or edema. 30 tablet 6  . Multiple Vitamin (MULTIVITAMIN WITH MINERALS) TABS Take 1 tablet by mouth daily.    Vladimir Faster Glycol-Propyl Glycol (SYSTANE) 0.4-0.3 % SOLN Place 1 drop into both eyes daily.    . potassium chloride SA (K-DUR) 20 MEQ tablet Take 1 tablet (20 mEq total) by mouth daily as needed (only with lasix). 30 tablet 6  . RABEprazole (ACIPHEX) 20 MG tablet Take one tablet by mouth once daily for stomach 90 tablet 1  . rivaroxaban (XARELTO) 20 MG TABS tablet Take 1 tablet (20 mg total) by mouth daily with supper. 90 tablet 3  . SYNTHROID 100 MCG tablet TAKE 1 TABLET BY MOUTH DAILY BEFORE BREAKFAST 90 tablet 1  . trandolapril (MAVIK) 2 MG tablet TAKE 1 TABLET BY MOUTH TWICE A DAY 180 tablet 3   Current Facility-Administered Medications  Medication Dose Route Frequency Provider Last Rate Last Dose  . 0.9 %  sodium chloride infusion  500 mL Intravenous Once Milus Banister, MD        Allergies  Allergen Reactions  . Hydrocodone Itching  . Cephalexin  Itching and Swelling  . Ciprofloxacin     Not indicated due to aneurysm in aorta    . Doxycycline Other (See Comments)    Per pt: unknown  . Escitalopram     Dry mouth  . Levofloxacin     Not indicated due to aneurysm in aorta   . Moxifloxacin     Not indicated due to aneurysm in aorta   . Ofloxacin Other (See Comments)    Per pt: unknown  . Sertraline     insomnia  . Sulfamethoxazole Hives    Any sulfa meds.  . Tape Other (See Comments)    Burns skin.   . Latex Rash    "If pt. Makes contact with or wearing"  . Neomycin Rash    Past Medical History:  Diagnosis Date  . AAA (abdominal aortic aneurysm) (HCC)    2.8 cm 05/2018; 5 year [place] per ACR guidelines  . Allergy    all year   . Anxiety disorder   . Arthritis   . Barrett's  esophagus 04/2008  . Benign neoplasm of colon   . Blood transfusion without reported diagnosis    long ago per pt  . Cataract    removed both eyes  . CHF (congestive heart failure) ([place])   . Chronic airway obstruction, not elsewhere classified   . Complication of anesthesia    slow to wake up  . Depressive disorder, not elsewhere classified   . Diverticulitis   . Dysthymic disorder   . Erosive gastritis 08/2004  . GERD (gastroesophageal reflux disease)   . Hair thinning   . HTN (hypertension)    pt denies   . Hyperplastic polyps of stomach 06/2002  . Hypothyroidism   . ICD (implantable cardiac defibrillator) in place    BiV/ICD; s/p removal of ICD with insertion of BiV PPM 04/26/12  . Incisional hernia   . Insomnia 08/20/2015  . Kyphosis 08/20/2015  . LBBB (left bundle branch block)   . Melanoma ([place])    right arm  . Mitral regurgitation   . Nonischemic cardiomyopathy ([place])    EF initially 20%; Last measurement up to 50% per echo in August of 2012  . Osteopenia   . Other and unspecified hyperlipidemia   . Other and unspecified hyperlipidemia   . Pacemaker 04/26/2012  . PAF (paroxysmal atrial fibrillation) ([place])    11/2016  . Pain in joint, lower leg   . Palpitations   . Pneumonia   . Restless legs syndrome (RLS)   . Synovial cyst of popliteal space   . Unspecified chronic bronchitis ([place])   . Unspecified nasal polyp   . Unspecified sinusitis (chronic)     Past Surgical History:  Procedure Laterality Date  . ABDOMINAL HYSTERECTOMY  1076   endometriosis  . APPENDECTOMY    . BI-VENTRICULAR PACEMAKER INSERTION N/A 04/26/2012   Procedure: BI-VENTRICULAR PACEMAKER INSERTION (CRT-P);  Surgeon: Evans Lance, MD;  Location[facility] CATH LAB;  Service: Cardiovascular;  Laterality: N/A;  . COLONOSCOPY    . defibrillator insertion     s/p removal of previously implanted BiV ICD and insertion of a new BiV pacemaker on 04/26/12  . excise mole of lip  2001   Dr. Larena Sox  . excision of  wen  1984   Dr. Parks Ranger  . FEMORAL HERNIA REPAIR  12/25/2012   Dr Dalbert Batman  . FEMORAL HERNIA REPAIR  01/04/2013   Recurrent - Dr. Lilyan Punt  . INCISIONAL HERNIA REPAIR N/A 10/01/2013   Procedure:  LAPAROSCOPIC INCISIONAL HERNIA;  Surgeon: Gayland Curry, MD;  [facility];  Service: General;  Laterality: N/A;  . INCISIONAL HERNIA REPAIR N/A 09/07/2018   Procedure: LAPAROSCOPIC ASSISTED REPAIR OF INCISIONAL HERNIA;  Surgeon: Greer Pickerel, MD;  Location: [place];  Service: General;  Laterality: N/A;  . INGUINAL HERNIA REPAIR Right 09/26/2012   Procedure: RIGHT INGUINAL HERNIA REPAIR WITH MESH;  Surgeon: Odis Hollingshead, MD;  Location: [place];  Service: General;  Laterality: Right;  . INGUINAL HERNIA REPAIR Right 12/25/2012   Procedure: explor right groin, small bowel rescection, tissue repair right femoral hernia;  Surgeon: Adin Hector, MD;  [facility];  Service: General;  Laterality: Right;  . INSERTION OF MESH Right 09/26/2012   Procedure: INSERTION OF MESH;  Surgeon: Odis Hollingshead, MD;  Location: [place];  Service: General;  Laterality: Right;  . INSERTION OF MESH N/A 10/01/2013   Procedure: INSERTION OF MESH;  Surgeon: Gayland Curry, MD;  [facility];  Service: General;  Laterality: N/A;  . LAPAROSCOPY N/A 01/04/2013   Procedure: Diagnostic Laparoscopy, exploratory laparotomy with small bowel resection, closure of right femoral hernia repair;  Surgeon: Madilyn Hook, DO;  [facility];  Service: General;  Laterality: N/A;  . Left arm surgery    . POLYPECTOMY    . RIGHT BREAST LUMPECTOMY  1988   Dr. Marylene Buerger  . right knee surgery     arthroscopy: Dr. Shellia Carwin  . TONSILLECTOMY    . UPPER GASTROINTESTINAL ENDOSCOPY      Social History   Tobacco Use  Smoking Status Former Smoker  . Packs/day: 1.00  . Years: 15.00  . Pack years: 15.00  . Types: Cigarettes  . Quit date: 12/22/1993  . Years since quitting: 25.1  Smokeless Tobacco Never Used  Tobacco Comment    Does'nt reall year quit     Social History   Substance and Sexual Activity  Alcohol Use No  . Alcohol/week: 0.0 standard drinks    Family History  Problem Relation Age of Onset  . Stroke Father   . Heart disease Other        maternal side  . Colon cancer Paternal Uncle   . Breast cancer Maternal Aunt   . Colon cancer Cousin   . Diabetes Neg Hx   . Colon polyps Neg Hx   . Rectal cancer Neg Hx   . Stomach cancer Neg Hx     Review of Systems: The review of systems is per the HPI.   All other systems were reviewed and are negative.  Physical Exam: BP 122/68   Pulse (!) 55   Ht 5\' 9"  (1.753 m)   Wt 123 lb 6.4 oz (56 kg)   SpO2 100%   BMI 18.22 kg/m  GENERAL:  Well appearing, elderly thin WF in NAD HEENT:  PERRL, EOMI, sclera are clear. Oropharynx is clear. NECK:  No jugular venous distention, carotid upstroke brisk and symmetric, no bruits, no thyromegaly or adenopathy LUNGS:  Clear to auscultation bilaterally CHEST:  Unremarkable HEART:  RRR,  PMI not displaced or sustained,S1 and S2 within normal limits, no S3, no S4: no clicks, no rubs, no murmurs ABD:  Soft, nontender. BS +, no masses or bruits. No hepatomegaly, no splenomegaly EXT:  2 + pulses throughout, no edema, no cyanosis no clubbing SKIN:  Warm and dry.  No rashes NEURO:  Alert and oriented x 3. Cranial nerves II through XII intact. PSYCH:  Cognitively intact  Wt Readings from Last 3 Encounters:  01/22/19 123 lb 6.4 oz (56 kg)  01/11/19 121 lb (54.9 kg)  11/02/18 125 lb (56.7 kg)     LABORATORY DATA: Lab Results  Component Value Date   WBC 6.9 01/11/2019   HGB 11.3 01/11/2019   HCT 33.7 (L) 01/11/2019   PLT 204 01/11/2019   GLUCOSE 89 01/11/2019   CHOL 143 07/21/2017   TRIG 117 07/21/2017   HDL 65 07/21/2017   LDLCALC 58 07/21/2017   ALT 15 04/20/2018   AST 20 04/20/2018   NA 136 01/11/2019   K 4.9 01/11/2019   CL 101 01/11/2019   CREATININE 1.02 (H) 01/11/2019   BUN 39 (H)  01/11/2019   CO2 22 01/11/2019   TSH 1.82 04/20/2018   INR 1.0 09/07/2018    Echo: 07/22/16: Study Conclusions  - Left ventricle: The cavity size was mildly dilated. Wall   thickness was normal. Systolic function was normal. The estimated   ejection fraction was in the range of 50% to 55%. Wall motion was   normal; there were no regional wall motion abnormalities. - Aortic valve: There was trivial regurgitation. - Mitral valve: There was mild regurgitation. - Atrial septum: No defect or patent foramen ovale was identified.    Assessment / Plan: 1.  History of dialted Cardiomyopathy with chronic systolic CHF.  EF last normal 55% in May 2018- has BiV/ICD in place.    She is asymptomatic.   2. NSVT. Follow up ICD/biV in device clinic. Due for generator change out Friday  3. Paroxysmal Afib. [place] Vasc score of at least 4. On Xarelto. Tolerating well.   4. Small AAA and left iliac aneurysm. Follow up with [facility]. Last doppler in August 2018 showed iliac diameter 1.6 cm and Aorta 2.4 cm.  CT of Abdomen/pelvis in March ooked OK. Follow up with [facility].   I will follow up in 6 months.

## 2019-01-22 ENCOUNTER — Encounter: Payer: Self-pay | Admitting: Cardiology

## 2019-01-22 ENCOUNTER — Ambulatory Visit (INDEPENDENT_AMBULATORY_CARE_PROVIDER_SITE_OTHER): Payer: Medicare Other | Admitting: Cardiology

## 2019-01-22 ENCOUNTER — Other Ambulatory Visit: Payer: Self-pay

## 2019-01-22 VITALS — BP 122/68 | HR 55 | Ht 69.0 in | Wt 123.4 lb

## 2019-01-22 DIAGNOSIS — Z950 Presence of cardiac pacemaker: Secondary | ICD-10-CM

## 2019-01-22 DIAGNOSIS — I420 Dilated cardiomyopathy: Secondary | ICD-10-CM

## 2019-01-22 DIAGNOSIS — I5022 Chronic systolic (congestive) heart failure: Secondary | ICD-10-CM

## 2019-01-22 DIAGNOSIS — I480 Paroxysmal atrial fibrillation: Secondary | ICD-10-CM

## 2019-01-23 ENCOUNTER — Other Ambulatory Visit (HOSPITAL_COMMUNITY)
Admission: RE | Admit: 2019-01-23 | Discharge: 2019-01-23 | Disposition: A | Discharge status: Home / Self Care | Payer: Medicare Other | Source: Ambulatory Visit | Attending: Internal Medicine | Admitting: Internal Medicine

## 2019-01-23 ENCOUNTER — Other Ambulatory Visit (HOSPITAL_COMMUNITY): Payer: Medicare Other

## 2019-01-23 DIAGNOSIS — Z20828 Contact with and (suspected) exposure to other viral communicable diseases: Secondary | ICD-10-CM | POA: Diagnosis not present

## 2019-01-23 DIAGNOSIS — Z01812 Encounter for preprocedural laboratory examination: Secondary | ICD-10-CM | POA: Insufficient documentation

## 2019-01-24 ENCOUNTER — Telehealth: Payer: Self-pay | Admitting: Internal Medicine

## 2019-01-24 LAB — NOVEL CORONAVIRUS, NAA (HOSP ORDER, SEND-OUT TO REF LAB; TAT 18-24 HRS): SARS-CoV-2, NAA: NOT DETECTED

## 2019-01-24 NOTE — Telephone Encounter (Signed)
Returned call to Pt.  All questions answered.  Encouragement given.

## 2019-01-24 NOTE — Telephone Encounter (Signed)
New message   Patient has questions about procedure on Friday. Please call to discuss.

## 2019-01-26 ENCOUNTER — Ambulatory Visit (HOSPITAL_COMMUNITY)
Admission: RE | Admit: 2019-01-26 | Discharge: 2019-01-26 | Disposition: A | Discharge status: Home / Self Care | Payer: Medicare Other | Attending: Internal Medicine | Admitting: Internal Medicine

## 2019-01-26 ENCOUNTER — Other Ambulatory Visit: Payer: Self-pay

## 2019-01-26 ENCOUNTER — Ambulatory Visit (HOSPITAL_COMMUNITY)
Admission: RE | Disposition: A | Discharge status: Home / Self Care | Payer: Self-pay | Source: Home / Self Care | Attending: Internal Medicine

## 2019-01-26 DIAGNOSIS — Z7901 Long term (current) use of anticoagulants: Secondary | ICD-10-CM | POA: Diagnosis not present

## 2019-01-26 DIAGNOSIS — I447 Left bundle-branch block, unspecified: Secondary | ICD-10-CM | POA: Diagnosis not present

## 2019-01-26 DIAGNOSIS — I509 Heart failure, unspecified: Secondary | ICD-10-CM | POA: Insufficient documentation

## 2019-01-26 DIAGNOSIS — I480 Paroxysmal atrial fibrillation: Secondary | ICD-10-CM | POA: Insufficient documentation

## 2019-01-26 DIAGNOSIS — Z8249 Family history of ischemic heart disease and other diseases of the circulatory system: Secondary | ICD-10-CM | POA: Diagnosis not present

## 2019-01-26 DIAGNOSIS — I428 Other cardiomyopathies: Secondary | ICD-10-CM | POA: Insufficient documentation

## 2019-01-26 DIAGNOSIS — K219 Gastro-esophageal reflux disease without esophagitis: Secondary | ICD-10-CM | POA: Diagnosis not present

## 2019-01-26 DIAGNOSIS — R634 Abnormal weight loss: Secondary | ICD-10-CM | POA: Insufficient documentation

## 2019-01-26 DIAGNOSIS — Z888 Allergy status to other drugs, medicaments and biological substances status: Secondary | ICD-10-CM | POA: Insufficient documentation

## 2019-01-26 DIAGNOSIS — I5022 Chronic systolic (congestive) heart failure: Secondary | ICD-10-CM | POA: Diagnosis not present

## 2019-01-26 DIAGNOSIS — Z79890 Hormone replacement therapy (postmenopausal): Secondary | ICD-10-CM | POA: Insufficient documentation

## 2019-01-26 DIAGNOSIS — Z87891 Personal history of nicotine dependence: Secondary | ICD-10-CM | POA: Diagnosis not present

## 2019-01-26 DIAGNOSIS — Z881 Allergy status to other antibiotic agents status: Secondary | ICD-10-CM | POA: Insufficient documentation

## 2019-01-26 DIAGNOSIS — E785 Hyperlipidemia, unspecified: Secondary | ICD-10-CM | POA: Insufficient documentation

## 2019-01-26 DIAGNOSIS — E039 Hypothyroidism, unspecified: Secondary | ICD-10-CM | POA: Insufficient documentation

## 2019-01-26 DIAGNOSIS — Z882 Allergy status to sulfonamides status: Secondary | ICD-10-CM | POA: Insufficient documentation

## 2019-01-26 DIAGNOSIS — M1990 Unspecified osteoarthritis, unspecified site: Secondary | ICD-10-CM | POA: Insufficient documentation

## 2019-01-26 DIAGNOSIS — G2581 Restless legs syndrome: Secondary | ICD-10-CM | POA: Diagnosis not present

## 2019-01-26 DIAGNOSIS — J449 Chronic obstructive pulmonary disease, unspecified: Secondary | ICD-10-CM | POA: Insufficient documentation

## 2019-01-26 DIAGNOSIS — M8580 Other specified disorders of bone density and structure, unspecified site: Secondary | ICD-10-CM | POA: Insufficient documentation

## 2019-01-26 DIAGNOSIS — Z885 Allergy status to narcotic agent status: Secondary | ICD-10-CM | POA: Diagnosis not present

## 2019-01-26 DIAGNOSIS — R195 Other fecal abnormalities: Secondary | ICD-10-CM

## 2019-01-26 DIAGNOSIS — I110 Hypertensive heart disease with heart failure: Secondary | ICD-10-CM | POA: Insufficient documentation

## 2019-01-26 DIAGNOSIS — Z45010 Encounter for checking and testing of cardiac pacemaker pulse generator [battery]: Secondary | ICD-10-CM

## 2019-01-26 DIAGNOSIS — I5023 Acute on chronic systolic (congestive) heart failure: Secondary | ICD-10-CM | POA: Diagnosis present

## 2019-01-26 DIAGNOSIS — Z79899 Other long term (current) drug therapy: Secondary | ICD-10-CM | POA: Diagnosis not present

## 2019-01-26 DIAGNOSIS — I340 Nonrheumatic mitral (valve) insufficiency: Secondary | ICD-10-CM | POA: Diagnosis not present

## 2019-01-26 HISTORY — PX: BIV PACEMAKER GENERATOR CHANGEOUT: EP1198

## 2019-01-26 LAB — SURGICAL PCR SCREEN
MRSA, PCR: NEGATIVE
Staphylococcus aureus: NEGATIVE

## 2019-01-26 SURGERY — BIV PACEMAKER GENERATOR CHANGEOUT

## 2019-01-26 MED ORDER — CHLORHEXIDINE GLUCONATE 4 % EX LIQD
4.0000 "application " | Freq: Once | CUTANEOUS | Status: DC
  Filled 2019-01-26: qty 60

## 2019-01-26 MED ORDER — MUPIROCIN 2 % EX OINT
TOPICAL_OINTMENT | CUTANEOUS | Status: AC
  Administered 2019-01-26: 1 via TOPICAL
  Filled 2019-01-26: qty 22

## 2019-01-26 MED ORDER — MIDAZOLAM HCL 5 MG/5ML IJ SOLN
INTRAMUSCULAR | Status: DC | PRN
  Administered 2019-01-26: 1 mg via INTRAVENOUS

## 2019-01-26 MED ORDER — SODIUM CHLORIDE 0.9 % IV SOLN
80.0000 mg | INTRAVENOUS | Status: AC
  Administered 2019-01-26: 80 mg

## 2019-01-26 MED ORDER — LIDOCAINE HCL 1 % IJ SOLN
INTRAMUSCULAR | Status: AC
  Filled 2019-01-26: qty 60

## 2019-01-26 MED ORDER — VANCOMYCIN HCL IN DEXTROSE 1-5 GM/200ML-% IV SOLN
1000.0000 mg | INTRAVENOUS | Status: DC
  Filled 2019-01-26: qty 200

## 2019-01-26 MED ORDER — ACETAMINOPHEN 325 MG PO TABS
325.0000 mg | ORAL_TABLET | ORAL | Status: DC | PRN
  Filled 2019-01-26: qty 2

## 2019-01-26 MED ORDER — ONDANSETRON HCL 4 MG/2ML IJ SOLN: 4.0000 mg | Freq: Four times a day (QID) | INTRAMUSCULAR | Status: DC | PRN

## 2019-01-26 MED ORDER — VANCOMYCIN HCL IN DEXTROSE 1-5 GM/200ML-% IV SOLN
INTRAVENOUS | Status: AC
  Filled 2019-01-26: qty 200

## 2019-01-26 MED ORDER — LIDOCAINE HCL (PF) 1 % IJ SOLN
INTRAMUSCULAR | Status: DC | PRN
  Administered 2019-01-26: 60 mL

## 2019-01-26 MED ORDER — MUPIROCIN 2 % EX OINT
1.0000 "application " | TOPICAL_OINTMENT | Freq: Once | CUTANEOUS | Status: AC
  Administered 2019-01-26: 10:00:00 1 via TOPICAL

## 2019-01-26 MED ORDER — VANCOMYCIN HCL 1000 MG IV SOLR
INTRAVENOUS | Status: AC | PRN
  Administered 2019-01-26: 1000 mg via INTRAVENOUS

## 2019-01-26 MED ORDER — SODIUM CHLORIDE 0.9 % IV SOLN
INTRAVENOUS | Status: AC
  Filled 2019-01-26: qty 2

## 2019-01-26 MED ORDER — SODIUM CHLORIDE 0.9 % IV SOLN: INTRAVENOUS | Status: DC

## 2019-01-26 MED ORDER — MIDAZOLAM HCL 5 MG/5ML IJ SOLN
INTRAMUSCULAR | Status: AC
  Filled 2019-01-26: qty 5

## 2019-01-26 MED ORDER — FENTANYL CITRATE (PF) 100 MCG/2ML IJ SOLN
INTRAMUSCULAR | Status: AC
  Filled 2019-01-26: qty 2

## 2019-01-26 SURGICAL SUPPLY — 5 items
CABLE SURGICAL S-101-97-12 (CABLE) ×2 IMPLANT
CRT-P PPM VALITUDE U125 (Pacemaker) ×2 IMPLANT
DEVICE CRT-P PPM VALITUDE U125 (Pacemaker) IMPLANT
PAD PRO RADIOLUCENT 2001M-C (PAD) ×2 IMPLANT
TRAY PACEMAKER INSERTION (PACKS) ×2 IMPLANT

## 2019-01-26 NOTE — H&P (Signed)
Cardiology Office Note Date:  01/11/2019  Patient ID:  [name], [name] 19, 1934, MRN [id number] PCP:  Gayland Curry, DO      Cardiologist:  Dr. [name] Electrophysiologist: Dr. Lovena Le   Chief Complaint:  Battery check  History of Present Illness: [name] is a 85 y.o. female with history of NICM, w/ CRT-P, LBBB, HTN, hypothyroidism, melanoma, anxiety/depressionsmall infrarenal AAA, following w/vascular surgery,  chronic abd discomfort after a number of abd surgeries,  PAFib.  She comes in today to be seen for dr. Lovena Le.  Last seen by him Feb 2020, at that time mentions known chronic elevated RV thresholds. She was nearing ERI at that time, no changes were made to her tx or programming.  More recently she saw A. Tiller, PA in device clinic, again noted chronically elevated RV threshold, no true VT.  Battery estimate was 3 months, pt declined remote monitoring planned for monthly battery checks. This is why she is here today  She is doing OK, no CP, palpitations, no SOB.  No dizziness, near syncope or syncope.  She very anxious about having a surgery done.    Doing OK with her xarelto, no bleeding or signs of bleeding  She has been seeing her PMD for some unintentional weight loss, inability to get weight on.  Device History: BSCi CRT-P, 04/26/12, original device (ICD) implanted 05/14/05,  Dr. Lovena Le       Past Medical History:  Diagnosis Date  . AAA (abdominal aortic aneurysm) (HCC)    2.8 cm 05/2018; 5 year [place] per ACR guidelines  . Allergy    all year   . Anxiety disorder   . Arthritis   . Barrett's esophagus 04/2008  . Benign neoplasm of colon   . Blood transfusion without reported diagnosis    long ago per pt  . Cataract    removed both eyes  . CHF (congestive heart failure) ([place])   . Chronic airway obstruction, not elsewhere classified   . Complication of anesthesia    slow to wake up  . Depressive disorder, not elsewhere  classified   . Diverticulitis   . Dysthymic disorder   . Erosive gastritis 08/2004  . GERD (gastroesophageal reflux disease)   . Hair thinning   . HTN (hypertension)    pt denies   . Hyperplastic polyps of stomach 06/2002  . Hypothyroidism   . ICD (implantable cardiac defibrillator) in place    BiV/ICD; s/p removal of ICD with insertion of BiV PPM 04/26/12  . Incisional hernia   . Insomnia 08/20/2015  . Kyphosis 08/20/2015  . LBBB (left bundle branch block)   . Melanoma ([place])    right arm  . Mitral regurgitation   . Nonischemic cardiomyopathy ([place])    EF initially 20%; Last measurement up to 50% per echo in [name] of 2012  . Osteopenia   . Other and unspecified hyperlipidemia   . Other and unspecified hyperlipidemia   . Pacemaker 04/26/2012  . PAF (paroxysmal atrial fibrillation) ([place])    11/2016  . Pain in joint, lower leg   . Palpitations   . Pneumonia   . Restless legs syndrome (RLS)   . Synovial cyst of popliteal space   . Unspecified chronic bronchitis ([place])   . Unspecified nasal polyp   . Unspecified sinusitis (chronic)          Past Surgical History:  Procedure Laterality Date  . ABDOMINAL HYSTERECTOMY  1076   endometriosis  . APPENDECTOMY    .  BI-VENTRICULAR PACEMAKER INSERTION N/A 04/26/2012   Procedure: BI-VENTRICULAR PACEMAKER INSERTION (CRT-P);  Surgeon: Evans Lance, MD;  Location: [facility] CATH LAB;  Service: Cardiovascular;  Laterality: N/A;  . COLONOSCOPY    . defibrillator insertion     s/p removal of previously implanted BiV ICD and insertion of a new BiV pacemaker on 04/26/12  . excise mole of lip  2001   Dr. Larena Sox  . excision of wen  1984   Dr. Parks Ranger  . FEMORAL HERNIA REPAIR  12/25/2012   Dr Dalbert Batman  . FEMORAL HERNIA REPAIR  01/04/2013   Recurrent - Dr. Lilyan Punt  . INCISIONAL HERNIA REPAIR N/A 10/01/2013   Procedure: LAPAROSCOPIC INCISIONAL HERNIA;  Surgeon: Gayland Curry, MD;  Location: [facility] ORS;   Service: General;  Laterality: N/A;  . INCISIONAL HERNIA REPAIR N/A 09/07/2018   Procedure: LAPAROSCOPIC ASSISTED REPAIR OF INCISIONAL HERNIA;  Surgeon: Greer Pickerel, MD;  Location: [place];  Service: General;  Laterality: N/A;  . INGUINAL HERNIA REPAIR Right 09/26/2012   Procedure: RIGHT INGUINAL HERNIA REPAIR WITH MESH;  Surgeon: Odis Hollingshead, MD;  Location: [place];  Service: General;  Laterality: Right;  . INGUINAL HERNIA REPAIR Right 12/25/2012   Procedure: explor right groin, small bowel rescection, tissue repair right femoral hernia;  Surgeon: Adin Hector, MD;  Location: [facility];  Service: General;  Laterality: Right;  . INSERTION OF MESH Right 09/26/2012   Procedure: INSERTION OF MESH;  Surgeon: Odis Hollingshead, MD;  Location: [place];  Service: General;  Laterality: Right;  . INSERTION OF MESH N/A 10/01/2013   Procedure: INSERTION OF MESH;  Surgeon: Gayland Curry, MD;  Location: [facility];  Service: General;  Laterality: N/A;  . LAPAROSCOPY N/A 01/04/2013   Procedure: Diagnostic Laparoscopy, exploratory laparotomy with small bowel resection, closure of right femoral hernia repair;  Surgeon: Madilyn Hook, DO;  Location: [facility];  Service: General;  Laterality: N/A;  . Left arm surgery    . POLYPECTOMY    . RIGHT BREAST LUMPECTOMY  1988   Dr. Marylene Buerger  . right knee surgery     arthroscopy: Dr. Shellia Carwin  . TONSILLECTOMY    . UPPER GASTROINTESTINAL ENDOSCOPY            Current Outpatient Medications  Medication Sig Dispense Refill  . acetaminophen (TYLENOL) 500 MG tablet Take 1,000 mg by mouth every 8 (eight) hours as needed. For pain     . Ascorbic Acid (VITAMIN C) 1000 MG tablet Take 1,000 mg by mouth daily.    . Biotin 5000 MCG TABS Take 1 tablet by mouth daily.    Marland Kitchen buPROPion (WELLBUTRIN XL) 150 MG 24 hr tablet TAKE 1 TABLET BY MOUTH EVERY DAY 90 tablet 2  . Calcium Carbonate-Vitamin D (CALCIUM + D PO) Take 2 tablets by mouth daily.     .  Cyanocobalamin (VITAMIN B 12) 500 MCG TABS Take 1,000 mcg by mouth daily.     Marland Kitchen estradiol (ESTRACE) 0.1 MG/GM vaginal cream Place 1 g vaginally 2 (two) times a week.    Marland Kitchen FERREX 150 150 MG capsule TAKE 1 CAPSULE BY MOUTH EVERY DAY 30 capsule 1  . fexofenadine (ALLEGRA) 60 MG tablet Take 60 mg by mouth daily as needed for allergies.    . furosemide (LASIX) 20 MG tablet Take 1 tablet (20 mg total) by mouth daily as needed for fluid or edema. 30 tablet 6  . guaiFENesin (MUCINEX) 600 MG 12 hr tablet Take 600 mg by mouth  daily as needed (nasal congestion).    . Magnesium 250 MG TABS Take 250 mg by mouth daily.     . Multiple Vitamin (MULTIVITAMIN WITH MINERALS) TABS Take 1 tablet by mouth daily.    . potassium chloride SA (K-DUR) 20 MEQ tablet Take 1 tablet (20 mEq total) by mouth daily as needed (only with lasix). 30 tablet 6  . RABEprazole (ACIPHEX) 20 MG tablet Take one tablet by mouth once daily for stomach 90 tablet 1  . rivaroxaban (XARELTO) 20 MG TABS tablet Take 1 tablet (20 mg total) by mouth daily with supper. 90 tablet 3  . SYNTHROID 100 MCG tablet TAKE 1 TABLET BY MOUTH DAILY BEFORE BREAKFAST 90 tablet 1  . trandolapril (MAVIK) 2 MG tablet TAKE 1 TABLET BY MOUTH TWICE A DAY 180 tablet 3            Current Facility-Administered Medications  Medication Dose Route Frequency Provider Last Rate Last Dose  . 0.9 %  sodium chloride infusion  500 mL Intravenous Once Milus Banister, MD        Allergies:   Hydrocodone, Cephalexin, Ciprofloxacin, Doxycycline, Escitalopram, Levofloxacin, Moxifloxacin, Ofloxacin, Sertraline, Sulfamethoxazole, Tape, Latex, and Neomycin   Social History:  The patient  reports that she quit smoking about 25 years ago. Her smoking use included cigarettes. She has a 15.00 pack-year smoking history. She has never used smokeless tobacco. She reports that she does not drink alcohol or use drugs.   Family History:  The patient's family history includes  Breast cancer in her maternal aunt; Colon cancer in her cousin and paternal uncle; Heart disease in an other family member; Stroke in her father.  ROS:  Please see the history of present illness.  All other systems are reviewed and otherwise negative.   PHYSICAL EXAM:  VS:  BP (!) 108/52   Pulse (!) 55   Ht 5\' 9"  (1.753 m)   Wt 121 lb (54.9 kg)   SpO2 99%   BMI 17.87 kg/m  BMI: Body mass index is 17.87 kg/m. Very thin, fragile body habitus,  in no acute distress, appears younger then her age  1: normocephalic, atraumatic  Neck: no JVD, carotid bruits or masses Cardiac:  RRR; no significant murmurs, no rubs, or gallops Lungs:  CTA b/l, no wheezing, rhonchi or rales  Abd: soft, nontender MS: no deformity, advanced atrophy even for her age Ext:  no edema  Skin: warm and dry, no rash Neuro:  No gross deficits appreciated Psych: euthymic mood, full affect  PPM site is stable, no tethering or discomfort, very thin, no erosion   EKG:  Not done today Device interrogation today and reviewed by myself:  ERI reached 12/09/2018 RV lead threshold known to be chronically high, otherwise lead measurements look good SB 50's today underlying 99% RV/LV paced 31% AP   07/22/16: TTE Study Conclusions - Left ventricle: The cavity size was mildly dilated. Wall thickness was normal. Systolic function was normal. The estimated ejection fraction was in the range of 50% to 55%. Wall motion was normal; there were no regional wall motion abnormalities. - Aortic valve: There was trivial regurgitation. - Mitral valve: There was mild regurgitation. - Atrial septum: No defect or patent foramen ovale was identified.    07/17/15: Echocardiogram Study Conclusions - Left ventricle: The cavity size was normal. Systolic function was normal. Wall motion was normal; there were no regional wall motion abnormalities. - Aortic valve: There was trivial regurgitation. - Mitral valve:  There was  mild regurgitation. - Left atrium: The atrium was mildly dilated.  Recent Labs: 04/20/2018: ALT 15; TSH 1.82 09/04/2018: BUN 36; Creatinine, Ser 0.84; Hemoglobin 10.8; Platelets 186; Potassium 4.7; Sodium 136  No results found for requested labs within last 8760 hours.   CrCl cannot be calculated (Patient's most recent lab result is older than the maximum 21 days allowed.).      Wt Readings from Last 3 Encounters:  01/11/19 121 lb (54.9 kg)  11/02/18 125 lb (56.7 kg)  09/07/18 125 lb (56.7 kg)     Other studies reviewed: Additional studies/records reviewed today include: summarized above  ASSESSMENT AND PLAN:   1. New PAFib     CHA2DS2Vasc is at least 4, on Xarelto      Her Calc Creat Cl using today's weight, last available Creat is 45     No bleeding reported.        Will see how her labs look, likely we w ill need to reduce her Xarelto dose  2. Hx NICM  3. LBBB     normalized EF device downgraded to CRT-P     No symptoms or exam findings to suggest volume OL  4. HTN     Stable  5. PPM     ERI reached 12/09/2018      I discussed with her generator change procedure, potential risks and benefits., outpatient procedure. Discussed RV lead, though I do not anticipate plans for new RV lead at procedure, discussed the possibility should lead measurements at time of procedure dictate the need.  Discussed this procedure, potential benefits and risks as well, and potential overnight stay.  She very much want to have sedation, so she took the available procedure dates in the next 2 weeks to see which works best for a ride/friend to be able to help her.   Disposition: CRT-P generator change, she will call Sonia Baller (Dr. Tanna Furry RN) once she has chosen a date, my MA will follow up with the patient tomorrow.  Usual post procedure follow up.  She sees Dr. [name] soon as well.  Current medicines are reviewed at length with the patient today.  The patient did not  have any concerns regarding medicines.  Haywood Lasso, PA-C 01/11/2019 6:22 PM     EP Attending  Patient seen and examined. Agree with the findings as noted above. The patient presents today for biv PPM gen change as her current device has reached ERI. I have reviewed the indications/risks/benefits/goals/expectations of the procedure and she wishes to proceed.  Mikle Bosworth.D.

## 2019-01-26 NOTE — Discharge Instructions (Signed)
Pacemaker Battery Change, Care After  This sheet gives you information about how to care for yourself after your procedure. Your health care provider may also give you more specific instructions. If you have problems or questions, contact your health care provider. What can I expect after the procedure? After your procedure, it is common to have:  Pain or soreness at the site where the cardiac device was inserted.  Swelling at the site where the cardiac device was inserted.  You should received an information card for your new device in 4-8 weeks. Follow these instructions at home: Incision care   Keep the incision clean and dry. ? Do not take baths, swim, or use a hot tub until after your wound check.  ? Do not shower for at least 7 days, or as directed by your health care provider. ? Pat the area dry with a clean towel. Do not rub the area. This may cause bleeding.  Follow instructions from your health care provider about how to take care of your incision. Make sure you: ? Leave stitches (sutures), skin glue, or adhesive strips in place. These skin closures may need to stay in place for 2 weeks or longer. If adhesive strip edges start to loosen and curl up, you may trim the loose edges. Do not remove adhesive strips completely unless your health care provider tells you to do that.  Check your incision area every day for signs of infection. Check for: ? More redness, swelling, or pain. ? More fluid or blood. ? Warmth. ? Pus or a bad smell. Activity  Do not lift anything that is heavier than 10 lb (4.5 kg) until your health care provider says it is okay to do so.  For the first week, or as long as told by your health care provider: ? Avoid lifting your affected arm higher than your shoulder. ? After 1 week, Be gentle when you move your arms over your head. It is okay to raise your arm to comb your hair. ? Avoid strenuous exercise.  Ask your health care provider when it is okay  to: ? Resume your normal activities. ? Return to work or school. ? Resume sexual activity. Eating and drinking  Eat a heart-healthy diet. This should include plenty of fresh fruits and vegetables, whole grains, low-fat dairy products, and lean protein like chicken and fish.  Limit alcohol intake to no more than 1 drink a day for non-pregnant women and 2 drinks a day for men. One drink equals 12 oz of beer, 5 oz of wine, or 1 oz of hard liquor.  Check ingredients and nutrition facts on packaged foods and beverages. Avoid the following types of food: ? Food that is high in salt (sodium). ? Food that is high in saturated fat, like full-fat dairy or red meat. ? Food that is high in trans fat, like fried food. ? Food and drinks that are high in sugar. Lifestyle  Do not use any products that contain nicotine or tobacco, such as cigarettes and e-cigarettes. If you need help quitting, ask your health care provider.  Take steps to manage and control your weight.  Once cleared, get regular exercise. Aim for 150 minutes of moderate-intensity exercise (such as walking or yoga) or 75 minutes of vigorous exercise (such as running or swimming) each week.  Manage other health problems, such as diabetes or high blood pressure. Ask your health care provider how you can manage these conditions. General instructions  Do not drive   for 24 hours after your procedure if you were given a medicine to help you relax (sedative).  Take over-the-counter and prescription medicines only as told by your health care provider.  Avoid putting pressure on the area where the cardiac device was placed.  If you need an MRI after your cardiac device has been placed, be sure to tell the health care provider who orders the MRI that you have a cardiac device.  Avoid close and prolonged exposure to electrical devices that have strong magnetic fields. These include: ? Cell phones. Avoid keeping them in a pocket near the  cardiac device, and try using the ear opposite the cardiac device. ? MP3 players. ? Household appliances, like microwaves. ? Metal detectors. ? Electric generators. ? High-tension wires.  Keep all follow-up visits as directed by your health care provider. This is important. Contact a health care provider if:  You have pain at the incision site that is not relieved by over-the-counter or prescription medicines.  You have any of these around your incision site or coming from it: ? More redness, swelling, or pain. ? Fluid or blood. ? Warmth to the touch. ? Pus or a bad smell.  You have a fever.  You feel brief, occasional palpitations, light-headedness, or any symptoms that you think might be related to your heart. Get help right away if:  You experience chest pain that is different from the pain at the cardiac device site.  You develop a red streak that extends above or below the incision site.  You experience shortness of breath.  You have palpitations or an irregular heartbeat.  You have light-headedness that does not go away quickly.  You faint or have dizzy spells.  Your pulse suddenly drops or increases rapidly and does not return to normal.  You begin to gain weight and your legs and ankles swell. Summary  After your procedure, it is common to have pain, soreness, and some swelling where the cardiac device was inserted.  Make sure to keep your incision clean and dry. Follow instructions from your health care provider about how to take care of your incision.  Check your incision every day for signs of infection, such as more pain or swelling, pus or a bad smell, warmth, or leaking fluid and blood.  Avoid strenuous exercise and lifting your left arm higher than your shoulder for 2 weeks, or as long as told by your health care provider. This information is not intended to replace advice given to you by your health care provider. Make sure you discuss any questions you  have with your health care provider. 

## 2019-01-27 ENCOUNTER — Telehealth: Payer: Self-pay | Admitting: Cardiology

## 2019-01-27 NOTE — Telephone Encounter (Signed)
   Spoke with patient regarding when to resume her Xarelto therapy for atrial fibrillation after generator change out performed 01/26/2019.  Patient states that she still has the bandage in place however there appears to be no hematoma or overt bleeding.  Discussed with Dr. Sallyanne Kuster who states to resume Xarelto today, 1 day after procedure.  Patient understands and agrees.  She is also to call the office on Monday morning regarding bandage recommendations.  Has wound care follow-up 04/09/2018.    Kathyrn Drown NP-C [place] Pager: [phone]

## 2019-01-29 ENCOUNTER — Encounter (HOSPITAL_COMMUNITY): Payer: Self-pay | Admitting: Internal Medicine

## 2019-01-29 ENCOUNTER — Telehealth: Payer: Self-pay | Admitting: Internal Medicine

## 2019-01-29 MED FILL — Lidocaine HCl Local Inj 1%: INTRAMUSCULAR | Qty: 60 | Status: AC

## 2019-01-29 MED FILL — Fentanyl Citrate Preservative Free (PF) Inj 100 MCG/2ML: INTRAMUSCULAR | Qty: 2 | Status: AC

## 2019-01-29 NOTE — Telephone Encounter (Signed)
Follow Up:   Pt said she had her device replaced on Friday. She says she have  Some questions, she does not know what to do please.

## 2019-01-29 NOTE — Telephone Encounter (Signed)
Spoke with patient, ok to remove Tegarderm dressing.   Chanetta Marshall, NP 01/29/2019 11:52 AM

## 2019-01-30 NOTE — Progress Notes (Signed)
NO LETTER NEEDED

## 2019-01-30 NOTE — Telephone Encounter (Signed)
Patient calling asking for someone to go back over the instructions for incision care.

## 2019-01-30 NOTE — Telephone Encounter (Signed)
Reviewed instructions with patient.   OK to remove tegaderm and loose gauze.  LEAVE steri-strips in place. Pt verbalized understanding.  Keep "clean and dry" for at least the first week. Pt was concerned about keeping it "clean" if she couldn't wash the area.  Reviewed wound care with the patient.  And told her she could clean around the area with a cloth, but to otherwise leave it dry.   No further questions at this time.   Legrand Como [address]" [place], PA-C  01/30/2019 2:51 PM

## 2019-01-31 ENCOUNTER — Other Ambulatory Visit: Payer: Self-pay | Admitting: Physician Assistant

## 2019-01-31 DIAGNOSIS — Z79899 Other long term (current) drug therapy: Secondary | ICD-10-CM

## 2019-02-07 ENCOUNTER — Other Ambulatory Visit: Payer: Self-pay

## 2019-02-07 ENCOUNTER — Ambulatory Visit (INDEPENDENT_AMBULATORY_CARE_PROVIDER_SITE_OTHER): Payer: Medicare Other | Admitting: *Deleted

## 2019-02-07 ENCOUNTER — Other Ambulatory Visit: Payer: Medicare Other

## 2019-02-07 DIAGNOSIS — I5022 Chronic systolic (congestive) heart failure: Secondary | ICD-10-CM | POA: Diagnosis not present

## 2019-02-07 DIAGNOSIS — I420 Dilated cardiomyopathy: Secondary | ICD-10-CM | POA: Diagnosis not present

## 2019-02-07 DIAGNOSIS — Z79899 Other long term (current) drug therapy: Secondary | ICD-10-CM | POA: Diagnosis not present

## 2019-02-07 NOTE — Patient Instructions (Signed)
Call office if you have swelling, discharge or redness at incision site.

## 2019-02-08 LAB — BASIC METABOLIC PANEL
BUN/Creatinine Ratio: 29 — ABNORMAL HIGH (ref 12–28)
BUN: 25 mg/dL (ref 8–27)
CO2: 22 mmol/L (ref 20–29)
Calcium: 9.1 mg/dL (ref 8.7–10.3)
Chloride: 102 mmol/L (ref 96–106)
Creatinine, Ser: 0.87 mg/dL (ref 0.57–1.00)
GFR calc Af Amer: 71 mL/min/{1.73_m2} (ref >59)
GFR calc non Af Amer: 61 mL/min/{1.73_m2} (ref >59)
Glucose: 79 mg/dL (ref 65–99)
Potassium: 5.2 mmol/L (ref 3.5–5.2)
Sodium: 137 mmol/L (ref 134–144)

## 2019-02-09 ENCOUNTER — Other Ambulatory Visit: Payer: Self-pay | Admitting: Internal Medicine

## 2019-02-09 ENCOUNTER — Other Ambulatory Visit: Payer: Self-pay | Admitting: *Deleted

## 2019-02-09 LAB — CUP PACEART INCLINIC DEVICE CHECK
Brady Statistic RA Percent Paced: 26 %
Brady Statistic RV Percent Paced: 75 %
Date Time Interrogation Session: 20201202000000
Implantable Lead Implant Date: 20070309
Implantable Lead Implant Date: 20070309
Implantable Lead Implant Date: 20070309
Implantable Lead Location: 753858
Implantable Lead Location: 753859
Implantable Lead Location: 753860
Implantable Lead Model: 158
Implantable Lead Model: 4087
Implantable Lead Model: 4543
Implantable Lead Serial Number: 121652
Implantable Lead Serial Number: 168416
Implantable Lead Serial Number: 268502
Implantable Pulse Generator Implant Date: 20201120
Lead Channel Impedance Value: 386 Ohm
Lead Channel Impedance Value: 469 Ohm
Lead Channel Impedance Value: 505 Ohm
Lead Channel Pacing Threshold Amplitude: 0.9 V
Lead Channel Pacing Threshold Amplitude: 1.4 V
Lead Channel Pacing Threshold Amplitude: 3 V
Lead Channel Pacing Threshold Pulse Width: 0.4 ms
Lead Channel Pacing Threshold Pulse Width: 1 ms
Lead Channel Pacing Threshold Pulse Width: 1.2 ms
Lead Channel Sensing Intrinsic Amplitude: 1.9 mV
Lead Channel Sensing Intrinsic Amplitude: 14.1 mV
Lead Channel Sensing Intrinsic Amplitude: 5.6 mV
Lead Channel Setting Pacing Amplitude: 2 V
Lead Channel Setting Pacing Amplitude: 2.6 V
Lead Channel Setting Pacing Amplitude: 4 V
Lead Channel Setting Pacing Pulse Width: 1 ms
Lead Channel Setting Pacing Pulse Width: 1.2 ms
Lead Channel Setting Sensing Sensitivity: 2.5 mV
Lead Channel Setting Sensing Sensitivity: 2.5 mV
Pulse Gen Serial Number: 714246

## 2019-02-09 MED ORDER — RIVAROXABAN 15 MG PO TABS: 15.0000 mg | ORAL_TABLET | Freq: Every day | ORAL | 3 refills | Status: DC

## 2019-02-09 NOTE — Progress Notes (Signed)
Wound check appointment. Steri-strips removed. Wound without redness or edema. Incision edges approximated, wound well healed. Normal device function. Thresholds, sensing, and impedances consistent with implant measurements. Device programmed at chronic settings. Histogram distribution appropriate for patient and level of activity. No mode switches or high ventricular rates noted. Patient educated about wound care, arm mobility, lifting restrictions. ROV with Dr Lovena Le 04/30/19.Patient declines remote monitoring.

## 2019-02-19 DIAGNOSIS — R350 Frequency of micturition: Secondary | ICD-10-CM | POA: Diagnosis not present

## 2019-02-19 DIAGNOSIS — N390 Urinary tract infection, site not specified: Secondary | ICD-10-CM | POA: Diagnosis not present

## 2019-03-08 ENCOUNTER — Ambulatory Visit: Payer: Medicare Other | Admitting: Internal Medicine

## 2019-03-10 ENCOUNTER — Other Ambulatory Visit: Payer: Self-pay | Admitting: Internal Medicine

## 2019-03-12 ENCOUNTER — Other Ambulatory Visit: Payer: Self-pay

## 2019-03-12 ENCOUNTER — Encounter: Payer: Self-pay | Admitting: Internal Medicine

## 2019-03-12 ENCOUNTER — Ambulatory Visit (INDEPENDENT_AMBULATORY_CARE_PROVIDER_SITE_OTHER): Payer: Medicare Other | Admitting: Internal Medicine

## 2019-03-12 VITALS — Ht 69.0 in | Wt 123.0 lb

## 2019-03-12 DIAGNOSIS — R05 Cough: Secondary | ICD-10-CM | POA: Diagnosis not present

## 2019-03-12 DIAGNOSIS — R059 Cough, unspecified: Secondary | ICD-10-CM

## 2019-03-12 DIAGNOSIS — F339 Major depressive disorder, recurrent, unspecified: Secondary | ICD-10-CM | POA: Diagnosis not present

## 2019-03-12 DIAGNOSIS — E440 Moderate protein-calorie malnutrition: Secondary | ICD-10-CM | POA: Diagnosis not present

## 2019-03-12 DIAGNOSIS — D508 Other iron deficiency anemias: Secondary | ICD-10-CM

## 2019-03-12 NOTE — Progress Notes (Signed)
Patient ID: [name], female   DOB: 16-Mar-1932, 86 y.o.   MRN: [id number] This service is provided via telemedicine  No vital signs collected/recorded due to the encounter was a telemedicine visit.   Location of patient (ex: home, work):  HOME  Patient consents to a telephone visit:  YES  Location of the provider (ex: office, home):  OFFICE  Name of any referring provider:  DR. Hollace Kinnier, DO  Names of all persons participating in the telemedicine service and their role in the encounter:  PATIENT, [name], CMA, Ishmael Berkovich,DO  Time spent on call:  6:26    Provider:  Tansy Lorek L. Mariea Clonts, D.O., C.M.D.  Goals of Care:  Advanced Directives 09/04/2018  Does Patient Have a Medical Advance Directive? Yes  Type of Paramedic of [place];Living will  Does patient want to make changes to medical advance directive? No - Patient declined  Copy of [place] in Chart? No - copy requested  Would patient like information on creating a medical advance directive? -  Pre-existing out of facility DNR order (yellow form or pink MOST form) -     Chief Complaint  Patient presents with  . Medical Management of Chronic Issues    4MTH FOLLOW-UP    HPI: Patient is a 86 y.o. female seen today for medical management of chronic diseases.    She has quit her wellbutrin and is taking iron for her anemia.  She wants to come off the iron but does not want to come in for labs right now.  She had her pacemaker on 11/20.   She's now on 15mg  of xarelto therapy.    She's had a cough for 2 weeks that is real rattly.  If she takes her allegra, that helps.  Chest is kind of sore.  She's not sure if she has bronchitis, pneumonia, or sinus congestion.  She also was on an antibiotic for a UTI when this started so she hoped it would help.    Past Medical History:  Diagnosis Date  . AAA (abdominal aortic aneurysm) (HCC)    2.8 cm 05/2018; 5 year [place] per ACR  guidelines  . Allergy    all year   . Anxiety disorder   . Arthritis   . Barrett's esophagus 04/2008  . Benign neoplasm of colon   . Blood transfusion without reported diagnosis    long ago per pt  . Cataract    removed both eyes  . CHF (congestive heart failure) ([place])   . Chronic airway obstruction, not elsewhere classified   . Complication of anesthesia    slow to wake up  . Depressive disorder, not elsewhere classified   . Diverticulitis   . Dysthymic disorder   . Erosive gastritis 08/2004  . GERD (gastroesophageal reflux disease)   . Hair thinning   . HTN (hypertension)    pt denies   . Hyperplastic polyps of stomach 06/2002  . Hypothyroidism   . ICD (implantable cardiac defibrillator) in place    BiV/ICD; s/p removal of ICD with insertion of BiV PPM 04/26/12  . Incisional hernia   . Insomnia 08/20/2015  . Kyphosis 08/20/2015  . LBBB (left bundle branch block)   . Melanoma ([place])    right arm  . Mitral regurgitation   . Nonischemic cardiomyopathy ([place])    EF initially 20%; Last measurement up to 50% per echo in August of 2012  . Osteopenia   . Other and unspecified hyperlipidemia   .  Other and unspecified hyperlipidemia   . Pacemaker 04/26/2012  . PAF (paroxysmal atrial fibrillation) ([place])    11/2016  . Pain in joint, lower leg   . Palpitations   . Pneumonia   . Restless legs syndrome (RLS)   . Synovial cyst of popliteal space   . Unspecified chronic bronchitis ([place])   . Unspecified nasal polyp   . Unspecified sinusitis (chronic)     Past Surgical History:  Procedure Laterality Date  . ABDOMINAL HYSTERECTOMY  1076   endometriosis  . APPENDECTOMY    . BI-VENTRICULAR PACEMAKER INSERTION N/A 04/26/2012   Procedure: BI-VENTRICULAR PACEMAKER INSERTION (CRT-P);  Surgeon: Evans Lance, MD;  [facility];  Service: Cardiovascular;  Laterality: N/A;  . BIV PACEMAKER GENERATOR CHANGEOUT N/A 01/26/2019   Procedure: BIV PACEMAKER GENERATOR CHANGEOUT;  Surgeon:  Evans Lance, MD;  Location: [facility] CV LAB;  Service: Cardiovascular;  Laterality: N/A;  . COLONOSCOPY    . defibrillator insertion     s/p removal of previously implanted BiV ICD and insertion of a new BiV pacemaker on 04/26/12  . excise mole of lip  2001   Dr. Larena Sox  . excision of wen  1984   Dr. Parks Ranger  . FEMORAL HERNIA REPAIR  12/25/2012   Dr Dalbert Batman  . FEMORAL HERNIA REPAIR  01/04/2013   Recurrent - Dr. Lilyan Punt  . INCISIONAL HERNIA REPAIR N/A 10/01/2013   Procedure: LAPAROSCOPIC INCISIONAL HERNIA;  Surgeon: Gayland Curry, MD;  Location: [facility];  Service: General;  Laterality: N/A;  . INCISIONAL HERNIA REPAIR N/A 09/07/2018   Procedure: LAPAROSCOPIC ASSISTED REPAIR OF INCISIONAL HERNIA;  Surgeon: Greer Pickerel, MD;  Location: [facility];  Service: General;  Laterality: N/A;  . INGUINAL HERNIA REPAIR Right 09/26/2012   Procedure: RIGHT INGUINAL HERNIA REPAIR WITH MESH;  Surgeon: Odis Hollingshead, MD;  Location: [facility];  Service: General;  Laterality: Right;  . INGUINAL HERNIA REPAIR Right 12/25/2012   Procedure: explor right groin, small bowel rescection, tissue repair right femoral hernia;  Surgeon: Adin Hector, MD;  Location: [facility];  Service: General;  Laterality: Right;  . INSERTION OF MESH Right 09/26/2012   Procedure: INSERTION OF MESH;  Surgeon: Odis Hollingshead, MD;  Location: [facility];  Service: General;  Laterality: Right;  . INSERTION OF MESH N/A 10/01/2013   Procedure: INSERTION OF MESH;  Surgeon: Gayland Curry, MD;  Location: [facility];  Service: General;  Laterality: N/A;  . LAPAROSCOPY N/A 01/04/2013   Procedure: Diagnostic Laparoscopy, exploratory laparotomy with small bowel resection, closure of right femoral hernia repair;  Surgeon: Madilyn Hook, DO;  Location: [facility];  Service: General;  Laterality: N/A;  . Left arm surgery    . POLYPECTOMY    . RIGHT BREAST LUMPECTOMY  1988   Dr. Marylene Buerger  . right knee surgery     arthroscopy: Dr. Shellia Carwin  . TONSILLECTOMY     . UPPER GASTROINTESTINAL ENDOSCOPY      Allergies  Allergen Reactions  . Hydrocodone Itching  . Cephalexin Itching and Swelling  . Ciprofloxacin     Not indicated due to aneurysm in aorta    . Doxycycline Other (See Comments)    Per pt: unknown  . Escitalopram     Dry mouth  . Levofloxacin     Not indicated due to aneurysm in aorta   . Moxifloxacin     Not indicated due to aneurysm in aorta   . Ofloxacin Other (See Comments)  Per pt: unknown  . Sertraline     insomnia  . Sulfamethoxazole Hives    Any sulfa meds.  . Tape Other (See Comments)    Burns skin.   . Latex Rash    "If pt. Makes contact with or wearing"  . Neomycin Rash    Outpatient Encounter Medications as of 03/12/2019  Medication Sig  . Ascorbic Acid (VITAMIN C) 1000 MG tablet Take 1,000 mg by mouth at bedtime.   . Biotin 5000 MCG TABS Take 1 tablet by mouth daily.  Marland Kitchen buPROPion (WELLBUTRIN XL) 150 MG 24 hr tablet TAKE 1 TABLET BY MOUTH EVERY DAY  . Calcium Carbonate-Vitamin D (CALCIUM + D PO) Take 2 tablets by mouth daily. 1000 units Vitamin D 1200 mg calcium  . Cyanocobalamin (VITAMIN B 12) 500 MCG TABS Take 1,000 mcg by mouth daily.   Marland Kitchen estradiol (ESTRACE) 0.1 MG/GM vaginal cream Place 1 g vaginally 2 (two) times a week.  Marland Kitchen FERREX 150 150 MG capsule TAKE 1 CAPSULE BY MOUTH EVERY DAY  . fexofenadine (ALLEGRA) 180 MG tablet Take 180 mg by mouth daily as needed for allergies.   . folic acid (FOLVITE) [id number] MCG tablet Take 400 mcg by mouth daily as needed (restless leg).  . furosemide (LASIX) 20 MG tablet Take 1 tablet (20 mg total) by mouth daily as needed for fluid or edema.  . Multiple Vitamin (MULTIVITAMIN WITH MINERALS) TABS Take 1 tablet by mouth daily.  Vladimir Faster Glycol-Propyl Glycol (SYSTANE) 0.4-0.3 % SOLN Place 1 drop into both eyes daily.  . potassium chloride SA (K-DUR) 20 MEQ tablet Take 1 tablet (20 mEq total) by mouth daily as needed (only with lasix).  . RABEprazole (ACIPHEX) 20 MG tablet Take  one tablet by mouth once daily for stomach  . Rivaroxaban (XARELTO) 15 MG TABS tablet Take 1 tablet (15 mg total) by mouth daily with supper.  Marland Kitchen SYNTHROID 100 MCG tablet TAKE 1 TABLET BY MOUTH DAILY BEFORE BREAKFAST  . trandolapril (MAVIK) 2 MG tablet TAKE 1 TABLET BY MOUTH TWICE A DAY  . acetaminophen (TYLENOL) 500 MG tablet Take 1,000 mg by mouth every 8 (eight) hours as needed. For pain    Facility-Administered Encounter Medications as of 03/12/2019  Medication  . 0.9 %  sodium chloride infusion    Review of Systems:  Review of Systems  Constitutional: Positive for malaise/fatigue. Negative for chills and fever.  HENT: Positive for congestion. Negative for sinus pain and sore throat.   Eyes: Negative for blurred vision.  Respiratory: Positive for cough and sputum production. Negative for shortness of breath and wheezing.   Cardiovascular: Negative for chest pain, palpitations and leg swelling.  Gastrointestinal: Negative for diarrhea.  Genitourinary: Negative for dysuria.  Musculoskeletal: Negative for falls.  Skin: Negative for rash.  Neurological: Negative for dizziness and loss of consciousness.  Endo/Heme/Allergies: Bruises/bleeds easily.  Psychiatric/Behavioral: Positive for depression.    Health Maintenance  Topic Date Due  . PNA vac Low Risk Adult (2 of 2 - PCV13) 03/13/2009  . INFLUENZA VACCINE  10/07/2018  . MAMMOGRAM  11/10/2019  . TETANUS/TDAP  03/15/2021  . DEXA SCAN  Completed    Physical Exam: Could not be performed as visit non face-to-face via phone   Labs reviewed: Basic Metabolic Panel: Recent Labs    04/20/18 1612 09/04/18 1354 01/11/19 1650 02/07/19 1633  NA 137 136 136 137  K 4.5 4.7 4.9 5.2  CL 104 105 101 102  CO2 25 24 22  22  GLUCOSE 84 96 89 79  BUN 29* 36* 39* 25  CREATININE 0.75 0.84 1.02* 0.87  CALCIUM 9.1 9.5 9.2 9.1  TSH 1.82  --   --   --    Liver Function Tests: Recent Labs    04/20/18 1612  AST 20  ALT 15  BILITOT 0.3   PROT 6.2   No results for input(s): LIPASE, AMYLASE in the last 8760 hours. No results for input(s): AMMONIA in the last 8760 hours. CBC: Recent Labs    04/20/18 1612 09/04/18 1354 01/11/19 1650  WBC 7.0 6.5 6.9  NEUTROABS 4,459  --   --   HGB 10.7* 10.8* 11.3  HCT 32.0* 33.8* 33.7*  MCV 87.7 88.9 87  PLT 215 186 204    Assessment/Plan 1. Moderate malnutrition ([place]) - f/u on anemia labs--has poor diet - CBC with Differential/Platelet; Future - Magnesium; Future - Basic metabolic panel; Future  2. Iron deficiency anemia secondary to inadequate dietary iron intake - was improving on last labs, f/u results - CBC with Differential/Platelet; Future  3. Cough -r/o covid with covid testing--pt given information by CMAs to set up appt for test -then will order CXR when results received--she can also then come in for the labs we need to f/u on--cbc, magnesium (already ordered)  4. Depression, recurrent ([place]) -for whatever reason, she's not taking wellbutrin until she finishes her iron--still has a very negative attitude and perspective on things and ideally should be on antidepressant  Labs/tests ordered:   Lab Orders     CBC with Differential/Platelet     Magnesium     Basic metabolic panel  Next appt:  F/u after covid test negative, CXR and labs done Non face-to-face time spent on televisit:  29 minutes including coordinating care for testing  Gunnar Hereford L. Quy Lotts, D.O. [facility] [address], [place] [address] Cell Phone (Mon-Fri 8am-5pm):  [phone] On Call:  [phone] & follow prompts after 5pm & weekends Office Phone:  [phone] Office Fax:  [phone]

## 2019-03-13 ENCOUNTER — Telehealth: Payer: Self-pay | Admitting: *Deleted

## 2019-03-13 NOTE — Telephone Encounter (Signed)
Pt was wanting the number to set up an appt for covid testing, [phone], I called and left message on VM with results

## 2019-03-21 ENCOUNTER — Ambulatory Visit: Payer: Medicare Other | Admitting: Vascular Surgery

## 2019-03-21 ENCOUNTER — Other Ambulatory Visit (HOSPITAL_COMMUNITY): Payer: Medicare Other

## 2019-03-21 ENCOUNTER — Other Ambulatory Visit: Payer: Self-pay | Admitting: Internal Medicine

## 2019-03-22 ENCOUNTER — Ambulatory Visit: Payer: Medicare Other | Attending: Internal Medicine

## 2019-03-22 DIAGNOSIS — Z20822 Contact with and (suspected) exposure to covid-19: Secondary | ICD-10-CM

## 2019-03-23 LAB — NOVEL CORONAVIRUS, NAA: SARS-CoV-2, NAA: NOT DETECTED

## 2019-03-27 ENCOUNTER — Telehealth: Payer: Self-pay | Admitting: General Practice

## 2019-03-27 NOTE — Telephone Encounter (Signed)
Gave patient negative covid test results Patient understood 

## 2019-03-29 ENCOUNTER — Telehealth: Payer: Self-pay | Admitting: *Deleted

## 2019-03-29 DIAGNOSIS — R05 Cough: Secondary | ICD-10-CM

## 2019-03-29 DIAGNOSIS — R636 Underweight: Secondary | ICD-10-CM

## 2019-03-29 DIAGNOSIS — R059 Cough, unspecified: Secondary | ICD-10-CM

## 2019-03-29 DIAGNOSIS — J418 Mixed simple and mucopurulent chronic bronchitis: Secondary | ICD-10-CM

## 2019-03-29 NOTE — Telephone Encounter (Signed)
Patient notified and agreed.  

## 2019-03-29 NOTE — Telephone Encounter (Signed)
Patient called and stated that her Covid Test was NEGATIVE. Patient is wondering if your going to do the Chest X-Ray now. Please Advise.

## 2019-03-29 NOTE — Telephone Encounter (Signed)
I now see her negative covid test (it was not sent to me).  I have placed the xray orders to be done at [facility] [address].  She can walk in for that.

## 2019-03-30 ENCOUNTER — Ambulatory Visit
Admission: RE | Admit: 2019-03-30 | Discharge: 2019-03-30 | Disposition: A | Discharge status: Home / Self Care | Payer: Medicare Other | Source: Ambulatory Visit | Attending: Internal Medicine | Admitting: Internal Medicine

## 2019-03-30 ENCOUNTER — Other Ambulatory Visit: Payer: Self-pay

## 2019-03-30 DIAGNOSIS — R05 Cough: Secondary | ICD-10-CM | POA: Diagnosis not present

## 2019-03-31 NOTE — Telephone Encounter (Signed)
Please call [name]:  Chest xray is negative.  No active heart or lung disease seen.  She has had chronic bronchitis and continues to at this point causing her chronic coughing.

## 2019-04-02 ENCOUNTER — Other Ambulatory Visit: Payer: Self-pay | Admitting: Internal Medicine

## 2019-04-02 NOTE — Telephone Encounter (Signed)
She is allergic to everything she could possibly take.  I suggest she use warm humidity, hydrate and rest.

## 2019-04-03 ENCOUNTER — Other Ambulatory Visit: Payer: Self-pay

## 2019-04-03 DIAGNOSIS — I714 Abdominal aortic aneurysm, without rupture, unspecified: Secondary | ICD-10-CM

## 2019-04-04 ENCOUNTER — Ambulatory Visit (HOSPITAL_COMMUNITY): Payer: Medicare Other

## 2019-04-04 ENCOUNTER — Ambulatory Visit: Payer: Medicare Other | Admitting: Vascular Surgery

## 2019-04-16 ENCOUNTER — Encounter: Payer: Self-pay | Admitting: Internal Medicine

## 2019-04-23 ENCOUNTER — Ambulatory Visit: Payer: Medicare Other | Attending: Internal Medicine

## 2019-04-23 ENCOUNTER — Other Ambulatory Visit: Payer: Self-pay

## 2019-04-23 DIAGNOSIS — Z23 Encounter for immunization: Secondary | ICD-10-CM | POA: Insufficient documentation

## 2019-04-23 NOTE — Progress Notes (Signed)
   Covid-19 Vaccination Clinic  Name:  [name]    MRN: [id number] DOB: Aug 20, 1932  04/23/2019  [name] was observed post Covid-19 immunization for 15 minutes without incidence. She was provided with Vaccine Information Sheet and instruction to access the V-Safe system.   [name] was instructed to call 911 with any severe reactions post vaccine: Marland Kitchen Difficulty breathing  . Swelling of your face and throat  . A fast heartbeat  . A bad rash all over your body  . Dizziness and weakness    Immunizations Administered    Name Date Dose VIS Date Route   Moderna COVID-19 Vaccine 04/23/2019  2:12 PM 0.5 mL 02/06/2019 Intramuscular   Manufacturer: Moderna   Lot: [id number]   [id number]

## 2019-04-30 ENCOUNTER — Encounter: Payer: Medicare Other | Admitting: Internal Medicine

## 2019-05-01 ENCOUNTER — Other Ambulatory Visit: Payer: Self-pay

## 2019-05-01 ENCOUNTER — Encounter: Payer: Self-pay | Admitting: Internal Medicine

## 2019-05-01 ENCOUNTER — Ambulatory Visit (INDEPENDENT_AMBULATORY_CARE_PROVIDER_SITE_OTHER): Payer: Medicare Other | Admitting: Internal Medicine

## 2019-05-01 VITALS — BP 110/62 | HR 55 | Ht 69.0 in | Wt 123.8 lb

## 2019-05-01 DIAGNOSIS — I5022 Chronic systolic (congestive) heart failure: Secondary | ICD-10-CM | POA: Diagnosis not present

## 2019-05-01 DIAGNOSIS — I480 Paroxysmal atrial fibrillation: Secondary | ICD-10-CM | POA: Diagnosis not present

## 2019-05-01 DIAGNOSIS — Z950 Presence of cardiac pacemaker: Secondary | ICD-10-CM

## 2019-05-01 MED ORDER — METOPROLOL TARTRATE 25 MG PO TABS: ORAL_TABLET | ORAL | 11 refills | Status: DC

## 2019-05-01 NOTE — Addendum Note (Signed)
Addended by: Willeen Cass A on: 05/01/2019 04:14 PM   Modules accepted: Orders

## 2019-05-01 NOTE — Patient Instructions (Addendum)
Medication Instructions:  Your physician recommends that you continue on your current medications as directed. Please refer to the Current Medication list given to you today.  EAT more FAT  Labwork: None ordered.  Testing/Procedures: None ordered.  Follow-Up:  Your physician wants you to follow-up in: 6 months with Dr. Lovena Le.   You will receive a reminder letter in the mail two months in advance. If you don't receive a letter, please call our office to schedule the follow-up appointment.  Any Other Special Instructions Will Be Listed Below (If Applicable).  If you need a refill on your cardiac medications before your next appointment, please call your pharmacy.

## 2019-05-01 NOTE — Progress Notes (Signed)
HPI [name] returns today for followup. She has PAF, chronic systolic heart failure with normalization of her LV function after biv device insertion. She has just undergone a PM gen change out. She denies chest pain except for when she goes into atrial fib.  Allergies  Allergen Reactions  . Hydrocodone Itching  . Cephalexin Itching and Swelling  . Ciprofloxacin     Not indicated due to aneurysm in aorta    . Doxycycline Other (See Comments)    Per pt: unknown  . Escitalopram     Dry mouth  . Levofloxacin     Not indicated due to aneurysm in aorta   . Moxifloxacin     Not indicated due to aneurysm in aorta   . Ofloxacin Other (See Comments)    Per pt: unknown  . Sertraline     insomnia  . Sulfamethoxazole Hives    Any sulfa meds.  . Tape Other (See Comments)    Burns skin.   . Latex Rash    "If pt. Makes contact with or wearing"  . Neomycin Rash     Current Outpatient Medications  Medication Sig Dispense Refill  . acetaminophen (TYLENOL) 500 MG tablet Take 1,000 mg by mouth every 8 (eight) hours as needed. For pain     . Ascorbic Acid (VITAMIN C) 1000 MG tablet Take 1,000 mg by mouth at bedtime.     . Biotin 5000 MCG TABS Take 1 tablet by mouth daily.    Marland Kitchen buPROPion (WELLBUTRIN XL) 150 MG 24 hr tablet TAKE 1 TABLET BY MOUTH EVERY DAY 90 tablet 2  . Calcium Carbonate-Vitamin D (CALCIUM + D PO) Take 2 tablets by mouth daily. 1000 units Vitamin D 1200 mg calcium    . Cyanocobalamin (VITAMIN B 12) 500 MCG TABS Take 1,000 mcg by mouth daily.     Marland Kitchen estradiol (ESTRACE) 0.1 MG/GM vaginal cream Place 1 g vaginally 2 (two) times a week.    Marland Kitchen FERREX 150 150 MG capsule TAKE 1 CAPSULE BY MOUTH EVERY DAY 90 capsule 1  . fexofenadine (ALLEGRA) 180 MG tablet Take 180 mg by mouth daily as needed for allergies.     . folic acid (FOLVITE) Q000111Q MCG tablet Take 400 mcg by mouth daily as needed (restless leg).    . furosemide (LASIX) 20 MG tablet Take 1 tablet (20 mg total) by mouth  daily as needed for fluid or edema. 30 tablet 6  . Multiple Vitamin (MULTIVITAMIN WITH MINERALS) TABS Take 1 tablet by mouth daily.    Vladimir Faster Glycol-Propyl Glycol (SYSTANE) 0.4-0.3 % SOLN Place 1 drop into both eyes daily.    . potassium chloride SA (K-DUR) 20 MEQ tablet Take 1 tablet (20 mEq total) by mouth daily as needed (only with lasix). 30 tablet 6  . RABEprazole (ACIPHEX) 20 MG tablet Take one tablet by mouth once daily for stomach 90 tablet 1  . Rivaroxaban (XARELTO) 15 MG TABS tablet Take 1 tablet (15 mg total) by mouth daily with supper. 90 tablet 3  . SYNTHROID 100 MCG tablet TAKE 1 TABLET BY MOUTH DAILY BEFORE BREAKFAST 90 tablet 1  . trandolapril (MAVIK) 2 MG tablet TAKE 1 TABLET BY MOUTH TWICE A DAY 180 tablet 3   Current Facility-Administered Medications  Medication Dose Route Frequency Provider Last Rate Last Admin  . 0.9 %  sodium chloride infusion  500 mL Intravenous Once Milus Banister, MD         Past  Medical History:  Diagnosis Date  . AAA (abdominal aortic aneurysm) (HCC)    2.8 cm 05/2018; 5 year [place] per ACR guidelines  . Allergy    all year   . Anxiety disorder   . Arthritis   . Barrett's esophagus 04/2008  . Benign neoplasm of colon   . Blood transfusion without reported diagnosis    long ago per pt  . Cataract    removed both eyes  . CHF (congestive heart failure) ([place])   . Chronic airway obstruction, not elsewhere classified   . Complication of anesthesia    slow to wake up  . Depressive disorder, not elsewhere classified   . Diverticulitis   . Dysthymic disorder   . Erosive gastritis 08/2004  . GERD (gastroesophageal reflux disease)   . Hair thinning   . HTN (hypertension)    pt denies   . Hyperplastic polyps of stomach 06/2002  . Hypothyroidism   . ICD (implantable cardiac defibrillator) in place    BiV/ICD; s/p removal of ICD with insertion of BiV PPM 04/26/12  . Incisional hernia   . Insomnia 08/20/2015  . Kyphosis 08/20/2015  . LBBB (left  bundle branch block)   . Melanoma ([place])    right arm  . Mitral regurgitation   . Nonischemic cardiomyopathy ([place])    EF initially 20%; Last measurement up to 50% per echo in August of 2012  . Osteopenia   . Other and unspecified hyperlipidemia   . Other and unspecified hyperlipidemia   . Pacemaker 04/26/2012  . PAF (paroxysmal atrial fibrillation) ([place])    11/2016  . Pain in joint, lower leg   . Palpitations   . Pneumonia   . Restless legs syndrome (RLS)   . Synovial cyst of popliteal space   . Unspecified chronic bronchitis ([place])   . Unspecified nasal polyp   . Unspecified sinusitis (chronic)     ROS:   All systems reviewed and negative except as noted in the HPI.   Past Surgical History:  Procedure Laterality Date  . ABDOMINAL HYSTERECTOMY  1076   endometriosis  . APPENDECTOMY    . BI-VENTRICULAR PACEMAKER INSERTION N/A 04/26/2012   Procedure: BI-VENTRICULAR PACEMAKER INSERTION (CRT-P);  Surgeon: Evans Lance, MD;  Location: [facility] CATH LAB;  Service: Cardiovascular;  Laterality: N/A;  . BIV PACEMAKER GENERATOR CHANGEOUT N/A 01/26/2019   Procedure: BIV PACEMAKER GENERATOR CHANGEOUT;  Surgeon: Evans Lance, MD;  Location: [place] CV LAB;  Service: Cardiovascular;  Laterality: N/A;  . COLONOSCOPY    . defibrillator insertion     s/p removal of previously implanted BiV ICD and insertion of a new BiV pacemaker on 04/26/12  . excise mole of lip  2001   Dr. Larena Sox  . excision of wen  1984   Dr. Parks Ranger  . FEMORAL HERNIA REPAIR  12/25/2012   Dr Dalbert Batman  . FEMORAL HERNIA REPAIR  01/04/2013   Recurrent - Dr. Lilyan Punt  . INCISIONAL HERNIA REPAIR N/A 10/01/2013   Procedure: LAPAROSCOPIC INCISIONAL HERNIA;  Surgeon: Gayland Curry, MD;  Location: [facility] ORS;  Service: General;  Laterality: N/A;  . INCISIONAL HERNIA REPAIR N/A 09/07/2018   Procedure: LAPAROSCOPIC ASSISTED REPAIR OF INCISIONAL HERNIA;  Surgeon: Greer Pickerel, MD;  Location: [place];  Service: General;  Laterality: N/A;   . INGUINAL HERNIA REPAIR Right 09/26/2012   Procedure: RIGHT INGUINAL HERNIA REPAIR WITH MESH;  Surgeon: Odis Hollingshead, MD;  Location: [place];  Service: General;  Laterality: Right;  . INGUINAL  HERNIA REPAIR Right 12/25/2012   Procedure: explor right groin, small bowel rescection, tissue repair right femoral hernia;  Surgeon: Adin Hector, MD;  Location: [facility];  Service: General;  Laterality: Right;  . INSERTION OF MESH Right 09/26/2012   Procedure: INSERTION OF MESH;  Surgeon: Odis Hollingshead, MD;  Location: [place];  Service: General;  Laterality: Right;  . INSERTION OF MESH N/A 10/01/2013   Procedure: INSERTION OF MESH;  Surgeon: Gayland Curry, MD;  Location: [facility];  Service: General;  Laterality: N/A;  . LAPAROSCOPY N/A 01/04/2013   Procedure: Diagnostic Laparoscopy, exploratory laparotomy with small bowel resection, closure of right femoral hernia repair;  Surgeon: Madilyn Hook, DO;  Location: [facility];  Service: General;  Laterality: N/A;  . Left arm surgery    . POLYPECTOMY    . RIGHT BREAST LUMPECTOMY  1988   Dr. Marylene Buerger  . right knee surgery     arthroscopy: Dr. Shellia Carwin  . TONSILLECTOMY    . UPPER GASTROINTESTINAL ENDOSCOPY       Family History  Problem Relation Age of Onset  . Stroke Father   . Heart disease Other        maternal side  . Colon cancer Paternal Uncle   . Breast cancer Maternal Aunt   . Colon cancer Cousin   . Diabetes Neg Hx   . Colon polyps Neg Hx   . Rectal cancer Neg Hx   . Stomach cancer Neg Hx      Social History   Socioeconomic History  . Marital status: Single    Spouse name: Not on file  . Number of children: 0  . Years of education: Not on file  . Highest education level: Not on file  Occupational History  . Occupation: Retired    Fish farm manager: RETIRED  Tobacco Use  . Smoking status: Former Smoker    Packs/day: 1.00    Years: 15.00    Pack years: 15.00    Types: Cigarettes    Quit date: 12/22/1993    Years since quitting:  25.3  . Smokeless tobacco: Never Used  . Tobacco comment: Does'nt reall year quit   Substance and Sexual Activity  . Alcohol use: No    Alcohol/week: 0.0 standard drinks  . Drug use: No  . Sexual activity: Never  Other Topics Concern  . Not on file  Social History Narrative   Pt lives alone in a condo   Social Determinants of Health   Financial Resource Strain:   . Difficulty of Paying Living Expenses: Not on file  Food Insecurity:   . Worried About Charity fundraiser in the Last Year: Not on file  . Ran Out of Food in the Last Year: Not on file  Transportation Needs:   . Lack of Transportation (Medical): Not on file  . Lack of Transportation (Non-Medical): Not on file  Physical Activity:   . Days of Exercise per Week: Not on file  . Minutes of Exercise per Session: Not on file  Stress:   . Feeling of Stress : Not on file  Social Connections:   . Frequency of Communication with Friends and Family: Not on file  . Frequency of Social Gatherings with Friends and Family: Not on file  . Attends Religious Services: Not on file  . Active Member of Clubs or Organizations: Not on file  . Attends Archivist Meetings: Not on file  . Marital Status: Not on file  Intimate Partner Violence:   .  Fear of Current or Ex-Partner: Not on file  . Emotionally Abused: Not on file  . Physically Abused: Not on file  . Sexually Abused: Not on file     BP 110/62   Pulse (!) 55   Ht 5\' 9"  (1.753 m)   Wt 123 lb 12.8 oz (56.2 kg)   SpO2 97%   BMI 18.28 kg/m   Physical Exam:  Well appearing NAD HEENT: Unremarkable Neck:  No JVD, no thyromegally Lymphatics:  No adenopathy Back:  No CVA tenderness Lungs:  Clear with no wheezes HEART:  Regular rate rhythm, no murmurs, no rubs, no clicks Abd:  soft, positive bowel sounds, no organomegally, no rebound, no guarding Ext:  2 plus pulses, no edema, no cyanosis, no clubbing Skin:  No rashes no nodules Neuro:  CN II through XII  intact, motor grossly intact  DEVICE  Normal device function.  See PaceArt for details.   Assess/Plan: 1. PAF - I have recommended she take as needed lopressor for symptoms and to help with rate control. 2. Malnutrition - her BMI is 18. I have strongly encouraged her to gain weight. 3. PM - her new b[place] sci device is working normally. We will recheck in several months. 4. Chronic systolic heart failure - her symptoms are class 2 and her EF is preserved. She will continue her current meds.   Mikle Bosworth.D.

## 2019-05-09 ENCOUNTER — Ambulatory Visit (HOSPITAL_COMMUNITY)
Admission: RE | Admit: 2019-05-09 | Discharge: 2019-05-09 | Disposition: A | Discharge status: Home / Self Care | Payer: Medicare Other | Source: Ambulatory Visit | Attending: Vascular Surgery | Admitting: Vascular Surgery

## 2019-05-09 ENCOUNTER — Ambulatory Visit (INDEPENDENT_AMBULATORY_CARE_PROVIDER_SITE_OTHER): Payer: Medicare Other | Admitting: Vascular Surgery

## 2019-05-09 ENCOUNTER — Other Ambulatory Visit: Payer: Self-pay

## 2019-05-09 ENCOUNTER — Encounter: Payer: Self-pay | Admitting: Vascular Surgery

## 2019-05-09 VITALS — BP 135/62 | HR 55 | Temp 97.3°F | Resp 20 | Ht 69.0 in | Wt 123.0 lb

## 2019-05-09 DIAGNOSIS — I714 Abdominal aortic aneurysm, without rupture, unspecified: Secondary | ICD-10-CM

## 2019-05-09 DIAGNOSIS — I872 Venous insufficiency (chronic) (peripheral): Secondary | ICD-10-CM

## 2019-05-09 NOTE — Progress Notes (Signed)
Patient name: [name] MRN: [id number] DOB: 1933/02/26 Sex: female  REASON FOR VISIT:   Follow-up of small abdominal aortic aneurysm.  Also chronic venous insufficiency.  HPI:   [name] is a pleasant 86 y.o. female who comes in for follow-up of a small abdominal aortic aneurysm.  Since I saw her last she denies any history of abdominal pain or back pain.  She does have a cousin who has a history of abdominal aortic aneurysm which has been growing and this concerns her.  Her main complaint today is that she has varicose veins of both lower extremities and is very concerned about these.  She does experience some right knee pain but currently is not having a lot of aching pain or heaviness in her legs.  She is very concerned about the appearance of her varicose veins on both legs.  She also has some sciatic pain on the right side.  I do not get any history of claudication or rest pain.  She is not a smoker.  Past Medical History:  Diagnosis Date  . AAA (abdominal aortic aneurysm) (HCC)    2.8 cm 05/2018; 5 year [place] per ACR guidelines  . Allergy    all year   . Anxiety disorder   . Arthritis   . Barrett's esophagus 04/2008  . Benign neoplasm of colon   . Blood transfusion without reported diagnosis    long ago per pt  . Cataract    removed both eyes  . CHF (congestive heart failure) ([place])   . Chronic airway obstruction, not elsewhere classified   . Complication of anesthesia    slow to wake up  . Depressive disorder, not elsewhere classified   . Diverticulitis   . Dysthymic disorder   . Erosive gastritis 08/2004  . GERD (gastroesophageal reflux disease)   . Hair thinning   . HTN (hypertension)    pt denies   . Hyperplastic polyps of stomach 06/2002  . Hypothyroidism   . ICD (implantable cardiac defibrillator) in place    BiV/ICD; s/p removal of ICD with insertion of BiV PPM 04/26/12  . Incisional hernia   . Insomnia 08/20/2015  . Kyphosis 08/20/2015  . LBBB (left  bundle branch block)   . Melanoma ([place])    right arm  . Mitral regurgitation   . Nonischemic cardiomyopathy ([place])    EF initially 20%; Last measurement up to 50% per echo in August of 2012  . Osteopenia   . Other and unspecified hyperlipidemia   . Other and unspecified hyperlipidemia   . Pacemaker 04/26/2012  . PAF (paroxysmal atrial fibrillation) ([place])    11/2016  . Pain in joint, lower leg   . Palpitations   . Pneumonia   . Restless legs syndrome (RLS)   . Synovial cyst of popliteal space   . Unspecified chronic bronchitis ([place])   . Unspecified nasal polyp   . Unspecified sinusitis (chronic)     Family History  Problem Relation Age of Onset  . Stroke Father   . Heart disease Other        maternal side  . Colon cancer Paternal Uncle   . Breast cancer Maternal Aunt   . Colon cancer Cousin   . Diabetes Neg Hx   . Colon polyps Neg Hx   . Rectal cancer Neg Hx   . Stomach cancer Neg Hx     SOCIAL HISTORY: Social History   Tobacco Use  . Smoking status: Former Smoker  Packs/day: 1.00    Years: 15.00    Pack years: 15.00    Types: Cigarettes    Quit date: 12/22/1993    Years since quitting: 25.3  . Smokeless tobacco: Never Used  . Tobacco comment: Does'nt reall year quit   Substance Use Topics  . Alcohol use: No    Alcohol/week: 0.0 standard drinks    Allergies  Allergen Reactions  . Hydrocodone Itching  . Cephalexin Itching and Swelling  . Ciprofloxacin     Not indicated due to aneurysm in aorta    . Doxycycline Other (See Comments)    Per pt: unknown  . Escitalopram     Dry mouth  . Levofloxacin     Not indicated due to aneurysm in aorta   . Moxifloxacin     Not indicated due to aneurysm in aorta   . Ofloxacin Other (See Comments)    Per pt: unknown  . Sertraline     insomnia  . Sulfamethoxazole Hives    Any sulfa meds.  . Tape Other (See Comments)    Burns skin.   . Latex Rash    "If pt. Makes contact with or wearing"  . Neomycin Rash     Current Outpatient Medications  Medication Sig Dispense Refill  . acetaminophen (TYLENOL) 500 MG tablet Take 1,000 mg by mouth every 8 (eight) hours as needed. For pain     . Ascorbic Acid (VITAMIN C) 1000 MG tablet Take 1,000 mg by mouth at bedtime.     . Biotin 5000 MCG TABS Take 1 tablet by mouth daily.    Marland Kitchen buPROPion (WELLBUTRIN XL) 150 MG 24 hr tablet TAKE 1 TABLET BY MOUTH EVERY DAY 90 tablet 2  . Calcium Carbonate-Vitamin D (CALCIUM + D PO) Take 2 tablets by mouth daily. 1000 units Vitamin D 1200 mg calcium    . Cyanocobalamin (VITAMIN B 12) 500 MCG TABS Take 1,000 mcg by mouth daily.     Marland Kitchen estradiol (ESTRACE) 0.1 MG/GM vaginal cream Place 1 g vaginally 2 (two) times a week.    Marland Kitchen FERREX 150 150 MG capsule TAKE 1 CAPSULE BY MOUTH EVERY DAY 90 capsule 1  . fexofenadine (ALLEGRA) 180 MG tablet Take 180 mg by mouth daily as needed for allergies.     . folic acid (FOLVITE) Q000111Q MCG tablet Take 400 mcg by mouth daily as needed (restless leg).    . furosemide (LASIX) 20 MG tablet Take 1 tablet (20 mg total) by mouth daily as needed for fluid or edema. 30 tablet 6  . metoprolol tartrate (LOPRESSOR) 25 MG tablet Take one tablet by mouth as needed for chest pain.  Maximum 2 tablets in one day. 60 tablet 11  . Multiple Vitamin (MULTIVITAMIN WITH MINERALS) TABS Take 1 tablet by mouth daily.    Vladimir Faster Glycol-Propyl Glycol (SYSTANE) 0.4-0.3 % SOLN Place 1 drop into both eyes daily.    . potassium chloride SA (K-DUR) 20 MEQ tablet Take 1 tablet (20 mEq total) by mouth daily as needed (only with lasix). 30 tablet 6  . RABEprazole (ACIPHEX) 20 MG tablet Take one tablet by mouth once daily for stomach 90 tablet 1  . Rivaroxaban (XARELTO) 15 MG TABS tablet Take 1 tablet (15 mg total) by mouth daily with supper. 90 tablet 3  . SYNTHROID 100 MCG tablet TAKE 1 TABLET BY MOUTH DAILY BEFORE BREAKFAST 90 tablet 1  . trandolapril (MAVIK) 2 MG tablet TAKE 1 TABLET BY MOUTH TWICE A DAY 180 tablet  3    Current Facility-Administered Medications  Medication Dose Route Frequency Provider Last Rate Last Admin  . 0.9 %  sodium chloride infusion  500 mL Intravenous Once Milus Banister, MD        REVIEW OF SYSTEMS:  [X]  denotes positive finding, [ ]  denotes negative finding Cardiac  Comments:  Chest pain or chest pressure:    Shortness of breath upon exertion:    Short of breath when lying flat:    Irregular heart rhythm:        Vascular    Pain in calf, thigh, or hip brought on by ambulation:    Pain in feet at night that wakes you up from your sleep:     Blood clot in your veins:    Leg swelling:         Pulmonary    Oxygen at home:    Productive cough:     Wheezing:         Neurologic    Sudden weakness in arms or legs:     Sudden numbness in arms or legs:     Sudden onset of difficulty speaking or slurred speech:    Temporary loss of vision in one eye:     Problems with dizziness:         Gastrointestinal    Blood in stool:     Vomited blood:         Genitourinary    Burning when urinating:     Blood in urine:        Psychiatric    Major depression:         Hematologic    Bleeding problems:    Problems with blood clotting too easily:        Skin    Rashes or ulcers:        Constitutional    Fever or chills:     PHYSICAL EXAM:   Vitals:   05/09/19 0932  BP: 135/62  Pulse: (!) 55  Resp: 20  Temp: (!) 97.3 F (36.3 C)  SpO2: 95%  Weight: 123 lb (55.8 kg)  Height: 5\' 9"  (1.753 m)    GENERAL: The patient is a well-nourished female, in no acute distress. The vital signs are documented above. CARDIAC: There is a regular rate and rhythm.  VASCULAR: I do not detect carotid bruits. Both feet are warm and well perfused. She has hyperpigmentation bilaterally consistent with chronic venous insufficiency.  She has some large dilated varicose veins bilaterally. PULMONARY: There is good air exchange bilaterally without wheezing or rales. ABDOMEN: Soft and  non-tender with normal pitched bowel sounds.  MUSCULOSKELETAL: There are no major deformities or cyanosis. NEUROLOGIC: No focal weakness or paresthesias are detected. SKIN: There are no ulcers or rashes noted. PSYCHIATRIC: The patient has a normal affect.  DATA:    DUPLEX ABDOMINAL AORTA: I have independently interpreted her duplex of the abdominal aorta.  The maximum diameter of the aneurysm is 2.9 cm which is not changed compared to her CT scan results in November 2019 when it was 2.9 cm.  The right common iliac artery measures 1.1 cm in maximum diameter.  The left common iliac artery measures 1.9 cm in maximum diameter.  Of note by CT scan in November 2019 the left common iliac artery measures 2.1 cm in maximum diameter.  MEDICAL ISSUES:   ABDOMINAL AORTIC ANEURYSM: The patient has a small 2.9 cm infrarenal abdominal aortic aneurysm.  This has been stable in size  over the last year.  I explained that I think it would be safe to stretch her follow-up out to at least 2 years however again she is very concerned about this and as a compromise we will study her in 18 months.  I have ordered a duplex of her aneurysm in 18 months.  She has a cousin who has an enlarging aneurysm and this obviously concerns her.  She is not a smoker.  Her blood pressure has been under good control.  CHRONIC VENOUS INSUFFICIENCY: The patient does have CEAP C4 venous disease.  We have discussed the importance of intermittent leg elevation of the proper positioning for this.  I edition I have encouraged her to wear knee-high compression stockings when she is going to be on her feet a lot.  We discussed the importance of exercise.  I have encouraged her to avoid prolonged sitting and standing.  If she develops significant lower extremity symptoms I think she would need formal venous reflux testing.  She will think about this and keep an eye on her symptoms.  She will call to schedule a venous appointment otherwise I will plan on  seeing her back in 18 months when she comes in for her follow-up abdominal aortic aneurysm study.Deitra Mayo [facility] [phone]

## 2019-05-10 ENCOUNTER — Other Ambulatory Visit: Payer: Self-pay | Admitting: *Deleted

## 2019-05-10 DIAGNOSIS — I714 Abdominal aortic aneurysm, without rupture, unspecified: Secondary | ICD-10-CM

## 2019-05-21 DIAGNOSIS — R350 Frequency of micturition: Secondary | ICD-10-CM | POA: Diagnosis not present

## 2019-05-21 DIAGNOSIS — N390 Urinary tract infection, site not specified: Secondary | ICD-10-CM | POA: Diagnosis not present

## 2019-05-22 ENCOUNTER — Ambulatory Visit: Payer: Medicare Other | Attending: Internal Medicine

## 2019-05-22 DIAGNOSIS — Z23 Encounter for immunization: Secondary | ICD-10-CM

## 2019-05-22 NOTE — Progress Notes (Signed)
   Covid-19 Vaccination Clinic  Name:  VENERA GUIFARRO    MRN: [id number] DOB: 12/17/1932  05/22/2019  Ms. Columbia was observed post Covid-19 immunization for 15 minutes without incident. She was provided with Vaccine Information Sheet and instruction to access the V-Safe system.   Ms. Cirrincione was instructed to call 911 with any severe reactions post vaccine: Marland Kitchen Difficulty breathing  . Swelling of face and throat  . A fast heartbeat  . A bad rash all over body  . Dizziness and weakness   Immunizations Administered    Name Date Dose VIS Date Route   Moderna COVID-19 Vaccine 05/22/2019  2:11 PM 0.5 mL 02/06/2019 Intramuscular   Manufacturer: Moderna   [id number]   [id number]

## 2019-06-15 ENCOUNTER — Telehealth: Payer: Self-pay

## 2019-06-15 NOTE — Telephone Encounter (Signed)
Spoke to patient she stated for the past several weeks she has not been feeling good.Stated she has no energy.No chest pain.Appointment scheduled with Dr.Jordan 06/19/19 at 3:40 pm.

## 2019-06-17 NOTE — Progress Notes (Signed)
[name] Date of Birth: 1933/01/31 Medical Record [id number]  History of Present Illness: [name] is seen today for follow up. She has multiple medical issues which include a cardiomyopathy, underlying BiV/ICD which resulted in improvement of her EF, LBBB, HTN, thyroid disease, melanoma, anxiety/depression and chronic systolic HF.   In September 2018 she was noted in device clinic to have paroxysmal Afib with controlled rate. She was started on Eliquis. [place] Vasc score of at least 4. She developed itching on Eliquis and switched to Xarelto. Itching did not go away so she was switched to Coumadin. After about a month the itching resolved.  She was later switched back to Xarelto without rash. She had change out of her ICD generator in November due to ERI.   On follow up today she has a lot of complaints. Feels fatigued with no energy. Back hurts a lot. She is depressed. Had a CXR in Jan due to cough which was clear. Still has some cough/allergies. Worried about her health.  Current Outpatient Medications  Medication Sig Dispense Refill  . acetaminophen (TYLENOL) 500 MG tablet Take 1,000 mg by mouth every 8 (eight) hours as needed. For pain     . Ascorbic Acid (VITAMIN C) 1000 MG tablet Take 1,000 mg by mouth at bedtime.     . Biotin 5000 MCG TABS Take 1 tablet by mouth daily.    Marland Kitchen buPROPion (WELLBUTRIN XL) 150 MG 24 hr tablet TAKE 1 TABLET BY MOUTH EVERY DAY 90 tablet 2  . Calcium Carbonate-Vitamin D (CALCIUM + D PO) Take 2 tablets by mouth daily. 1000 units Vitamin D 1200 mg calcium    . Cyanocobalamin (VITAMIN B 12) 500 MCG TABS Take 1,000 mcg by mouth daily.     Marland Kitchen estradiol (ESTRACE) 0.1 MG/GM vaginal cream Place 1 g vaginally 2 (two) times a week.    Marland Kitchen FERREX 150 150 MG capsule TAKE 1 CAPSULE BY MOUTH EVERY DAY 90 capsule 1  . fexofenadine (ALLEGRA) 180 MG tablet Take 180 mg by mouth daily as needed for allergies.     . folic acid (FOLVITE) [id number] MCG tablet Take 400 mcg by mouth  daily as needed (restless leg).    . furosemide (LASIX) 20 MG tablet Take 1 tablet (20 mg total) by mouth daily as needed for fluid or edema. 30 tablet 6  . metoprolol tartrate (LOPRESSOR) 25 MG tablet Take one tablet by mouth as needed for chest pain.  Maximum 2 tablets in one day. 60 tablet 11  . Multiple Vitamin (MULTIVITAMIN WITH MINERALS) TABS Take 1 tablet by mouth daily.    Vladimir Faster Glycol-Propyl Glycol (SYSTANE) 0.4-0.3 % SOLN Place 1 drop into both eyes daily.    . potassium chloride SA (K-DUR) 20 MEQ tablet Take 1 tablet (20 mEq total) by mouth daily as needed (only with lasix). 30 tablet 6  . RABEprazole (ACIPHEX) 20 MG tablet Take one tablet by mouth once daily for stomach 90 tablet 1  . Rivaroxaban (XARELTO) 15 MG TABS tablet Take 1 tablet (15 mg total) by mouth daily with supper. 90 tablet 3  . SYNTHROID 100 MCG tablet TAKE 1 TABLET BY MOUTH DAILY BEFORE BREAKFAST 90 tablet 1  . trandolapril (MAVIK) 2 MG tablet TAKE 1 TABLET BY MOUTH TWICE A DAY 180 tablet 3   Current Facility-Administered Medications  Medication Dose Route Frequency Provider Last Rate Last Admin  . 0.9 %  sodium chloride infusion  500 mL Intravenous Once Milus Banister, MD  Allergies  Allergen Reactions  . Hydrocodone Itching  . Cephalexin Itching and Swelling  . Ciprofloxacin     Not indicated due to aneurysm in aorta    . Doxycycline Other (See Comments)    Per pt: unknown  . Escitalopram     Dry mouth  . Levofloxacin     Not indicated due to aneurysm in aorta   . Moxifloxacin     Not indicated due to aneurysm in aorta   . Ofloxacin Other (See Comments)    Per pt: unknown  . Sertraline     insomnia  . Sulfamethoxazole Hives    Any sulfa meds.  . Tape Other (See Comments)    Burns skin.   . Latex Rash    "If pt. Makes contact with or wearing"  . Neomycin Rash    Past Medical History:  Diagnosis Date  . AAA (abdominal aortic aneurysm) (HCC)    2.8 cm 05/2018; 5 year [place] per ACR  guidelines  . Allergy    all year   . Anxiety disorder   . Arthritis   . Barrett's esophagus 04/2008  . Benign neoplasm of colon   . Blood transfusion without reported diagnosis    long ago per pt  . Cataract    removed both eyes  . CHF (congestive heart failure) ([place])   . Chronic airway obstruction, not elsewhere classified   . Complication of anesthesia    slow to wake up  . Depressive disorder, not elsewhere classified   . Diverticulitis   . Dysthymic disorder   . Erosive gastritis 08/2004  . GERD (gastroesophageal reflux disease)   . Hair thinning   . HTN (hypertension)    pt denies   . Hyperplastic polyps of stomach 06/2002  . Hypothyroidism   . ICD (implantable cardiac defibrillator) in place    BiV/ICD; s/p removal of ICD with insertion of BiV PPM 04/26/12  . Incisional hernia   . Insomnia 08/20/2015  . Kyphosis 08/20/2015  . LBBB (left bundle branch block)   . Melanoma ([place])    right arm  . Mitral regurgitation   . Nonischemic cardiomyopathy ([place])    EF initially 20%; Last measurement up to 50% per echo in August of 2012  . Osteopenia   . Other and unspecified hyperlipidemia   . Other and unspecified hyperlipidemia   . Pacemaker 04/26/2012  . PAF (paroxysmal atrial fibrillation) ([place])    11/2016  . Pain in joint, lower leg   . Palpitations   . Pneumonia   . Restless legs syndrome (RLS)   . Synovial cyst of popliteal space   . Unspecified chronic bronchitis ([place])   . Unspecified nasal polyp   . Unspecified sinusitis (chronic)     Past Surgical History:  Procedure Laterality Date  . ABDOMINAL HYSTERECTOMY  1076   endometriosis  . APPENDECTOMY    . BI-VENTRICULAR PACEMAKER INSERTION N/A 04/26/2012   Procedure: BI-VENTRICULAR PACEMAKER INSERTION (CRT-P);  Surgeon: Evans Lance, MD;  Location: [facility] CATH LAB;  Service: Cardiovascular;  Laterality: N/A;  . BIV PACEMAKER GENERATOR CHANGEOUT N/A 01/26/2019   Procedure: BIV PACEMAKER GENERATOR CHANGEOUT;  Surgeon:  Evans Lance, MD;  Location: [place] CV LAB;  Service: Cardiovascular;  Laterality: N/A;  . COLONOSCOPY    . defibrillator insertion     s/p removal of previously implanted BiV ICD and insertion of a new BiV pacemaker on 04/26/12  . excise mole of lip  2001   Dr. Larena Sox  .  excision of wen  1984   Dr. Parks Ranger  . FEMORAL HERNIA REPAIR  12/25/2012   Dr Dalbert Batman  . FEMORAL HERNIA REPAIR  01/04/2013   Recurrent - Dr. Lilyan Punt  . INCISIONAL HERNIA REPAIR N/A 10/01/2013   Procedure: LAPAROSCOPIC INCISIONAL HERNIA;  Surgeon: Gayland Curry, MD;  Location: [facility];  Service: General;  Laterality: N/A;  . INCISIONAL HERNIA REPAIR N/A 09/07/2018   Procedure: LAPAROSCOPIC ASSISTED REPAIR OF INCISIONAL HERNIA;  Surgeon: Greer Pickerel, MD;  Location: [facility];  Service: General;  Laterality: N/A;  . INGUINAL HERNIA REPAIR Right 09/26/2012   Procedure: RIGHT INGUINAL HERNIA REPAIR WITH MESH;  Surgeon: Odis Hollingshead, MD;  Location: [facility];  Service: General;  Laterality: Right;  . INGUINAL HERNIA REPAIR Right 12/25/2012   Procedure: explor right groin, small bowel rescection, tissue repair right femoral hernia;  Surgeon: Adin Hector, MD;  Location: [facility];  Service: General;  Laterality: Right;  . INSERTION OF MESH Right 09/26/2012   Procedure: INSERTION OF MESH;  Surgeon: Odis Hollingshead, MD;  Location: [facility];  Service: General;  Laterality: Right;  . INSERTION OF MESH N/A 10/01/2013   Procedure: INSERTION OF MESH;  Surgeon: Gayland Curry, MD;  Location: [facility];  Service: General;  Laterality: N/A;  . LAPAROSCOPY N/A 01/04/2013   Procedure: Diagnostic Laparoscopy, exploratory laparotomy with small bowel resection, closure of right femoral hernia repair;  Surgeon: Madilyn Hook, DO;  Location: [facility];  Service: General;  Laterality: N/A;  . Left arm surgery    . POLYPECTOMY    . RIGHT BREAST LUMPECTOMY  1988   Dr. Marylene Buerger  . right knee surgery     arthroscopy: Dr. Shellia Carwin  . TONSILLECTOMY     . UPPER GASTROINTESTINAL ENDOSCOPY      Social History   Tobacco Use  Smoking Status Former Smoker  . Packs/day: 1.00  . Years: 15.00  . Pack years: 15.00  . Types: Cigarettes  . Quit date: 12/22/1993  . Years since quitting: 25.5  Smokeless Tobacco Never Used  Tobacco Comment   Does'nt reall year quit     Social History   Substance and Sexual Activity  Alcohol Use No  . Alcohol/week: 0.0 standard drinks    Family History  Problem Relation Age of Onset  . Stroke Father   . Heart disease Other        maternal side  . Colon cancer Paternal Uncle   . Breast cancer Maternal Aunt   . Colon cancer Cousin   . Diabetes Neg Hx   . Colon polyps Neg Hx   . Rectal cancer Neg Hx   . Stomach cancer Neg Hx     Review of Systems: The review of systems is per the HPI.   All other systems were reviewed and are negative.  Physical Exam: BP 125/71   Pulse 68   Temp (!) 96.1 F (35.6 C)   Ht 5\' 8"  (1.727 m)   Wt 123 lb 6.4 oz (56 kg)   SpO2 96%   BMI 18.76 kg/m  GENERAL:  Well appearing, elderly thin WF in NAD HEENT:  PERRL, EOMI, sclera are clear. Oropharynx is clear. NECK:  No jugular venous distention, carotid upstroke brisk and symmetric, no bruits, no thyromegaly or adenopathy LUNGS:  Clear to auscultation bilaterally CHEST:  Unremarkable HEART:  RRR,  PMI not displaced or sustained,S1 and S2 within normal limits, no S3, no S4: no clicks, no rubs, no murmurs ABD:  Soft,  nontender. BS +, no masses or bruits. No hepatomegaly, no splenomegaly EXT:  2 + pulses throughout, no edema, no cyanosis no clubbing SKIN:  Warm and dry.  No rashes NEURO:  Alert and oriented x 3. Cranial nerves II through XII intact. PSYCH:  Cognitively intact      Wt Readings from Last 3 Encounters:  06/19/19 123 lb 6.4 oz (56 kg)  05/09/19 123 lb (55.8 kg)  05/01/19 123 lb 12.8 oz (56.2 kg)     LABORATORY DATA: Lab Results  Component Value Date   WBC 6.9 01/11/2019   HGB 11.3  01/11/2019   HCT 33.7 (L) 01/11/2019   PLT 204 01/11/2019   GLUCOSE 79 02/07/2019   CHOL 143 07/21/2017   TRIG 117 07/21/2017   HDL 65 07/21/2017   LDLCALC 58 07/21/2017   ALT 15 04/20/2018   AST 20 04/20/2018   NA 137 02/07/2019   K 5.2 02/07/2019   CL 102 02/07/2019   CREATININE 0.87 02/07/2019   BUN 25 02/07/2019   CO2 22 02/07/2019   TSH 1.82 04/20/2018   INR 1.0 09/07/2018    Echo: 07/22/16: Study Conclusions  - Left ventricle: The cavity size was mildly dilated. Wall   thickness was normal. Systolic function was normal. The estimated   ejection fraction was in the range of 50% to 55%. Wall motion was   normal; there were no regional wall motion abnormalities. - Aortic valve: There was trivial regurgitation. - Mitral valve: There was mild regurgitation. - Atrial septum: No defect or patent foramen ovale was identified.    Assessment / Plan: 1.  History of dialted Cardiomyopathy with chronic systolic CHF.  EF last normal 55% in May 2018- has BiV/ICD in place.    She is euvolemic. Will update Echo now due to symptoms of fatigue.   2. NSVT. Follow up ICD/biV in device clinic. S/p generator change out in November.   3. Paroxysmal Afib. [place] Vasc score of at least 4. On Xarelto. Tolerating well.   4. Small AAA and left iliac aneurysm. Follow up with [facility]. Last doppler in August 2018 showed iliac diameter 1.6 cm and Aorta 2.4 cm.  CT of Abdomen/pelvis in March ooked OK. Follow up with [facility].  5. Symptoms of fatigue/depression. Will check labs today including CBC, CMET, lipids and TFTs. Will update Echo. Notes she has an antidepressant to take and is going to give it a try.   I will follow up in 6 months.

## 2019-06-19 ENCOUNTER — Ambulatory Visit (INDEPENDENT_AMBULATORY_CARE_PROVIDER_SITE_OTHER): Payer: Medicare Other | Admitting: Cardiology

## 2019-06-19 ENCOUNTER — Encounter: Payer: Self-pay | Admitting: Cardiology

## 2019-06-19 ENCOUNTER — Other Ambulatory Visit: Payer: Self-pay

## 2019-06-19 VITALS — BP 125/71 | HR 68 | Temp 96.1°F | Ht 68.0 in | Wt 123.4 lb

## 2019-06-19 DIAGNOSIS — Z950 Presence of cardiac pacemaker: Secondary | ICD-10-CM

## 2019-06-19 DIAGNOSIS — E039 Hypothyroidism, unspecified: Secondary | ICD-10-CM | POA: Diagnosis not present

## 2019-06-19 DIAGNOSIS — I447 Left bundle-branch block, unspecified: Secondary | ICD-10-CM | POA: Diagnosis not present

## 2019-06-19 DIAGNOSIS — R5383 Other fatigue: Secondary | ICD-10-CM

## 2019-06-19 DIAGNOSIS — I5022 Chronic systolic (congestive) heart failure: Secondary | ICD-10-CM

## 2019-06-19 NOTE — Patient Instructions (Signed)
Medication Instructions:  Continue same medications *If you need a refill on your cardiac medications before your next appointment, please call your pharmacy*   Lab Work: Cbc,cmet,lipid panel,tsh,free t4 today    Testing/Procedures: Schedule Echo   Follow-Up: At [facility], you and your health needs are our priority.  As part of our continuing mission to provide you with exceptional heart care, we have created designated Provider Care Teams.  These Care Teams include your primary Cardiologist (physician) and Advanced Practice Providers (APPs -  Physician Assistants and Nurse Practitioners) who all work together to provide you with the care you need, when you need it.  We recommend signing up for the patient portal called "MyChart".  Sign up information is provided on this After Visit Summary.  MyChart is used to connect with patients for Virtual Visits (Telemedicine).  Patients are able to view lab/test results, encounter notes, upcoming appointments, etc.  Non-urgent messages can be sent to your provider as well.   To learn more about what you can do with MyChart, go to [email or web address].    Your next appointment:  6 months   Call in July to schedule Oct appointment    The format for your next appointment: Office    Provider: Dr.Jordan

## 2019-06-20 LAB — CBC WITH DIFFERENTIAL/PLATELET
Basophils Absolute: 0 10*3/uL (ref 0.0–0.2)
Basos: 0 %
EOS (ABSOLUTE): 0.3 10*3/uL (ref 0.0–0.4)
Eos: 6 %
Hematocrit: 37.2 % (ref 34.0–46.6)
Hemoglobin: 12.3 g/dL (ref 11.1–15.9)
Immature Grans (Abs): 0 10*3/uL (ref 0.0–0.1)
Immature Granulocytes: 0 %
Lymphocytes Absolute: 1.4 10*3/uL (ref 0.7–3.1)
Lymphs: 24 %
MCH: 29.6 pg (ref 26.6–33.0)
MCHC: 33.1 g/dL (ref 31.5–35.7)
MCV: 89 fL (ref 79–97)
Monocytes Absolute: 0.6 10*3/uL (ref 0.1–0.9)
Monocytes: 11 %
Neutrophils Absolute: 3.4 10*3/uL (ref 1.4–7.0)
Neutrophils: 59 %
Platelets: 183 10*3/uL (ref 150–450)
RBC: 4.16 x10E6/uL (ref 3.77–5.28)
RDW: 13.7 % (ref 11.7–15.4)
WBC: 5.7 10*3/uL (ref 3.4–10.8)

## 2019-06-20 LAB — COMPREHENSIVE METABOLIC PANEL
ALT: 10 IU/L (ref 0–32)
AST: 19 IU/L (ref 0–40)
Albumin/Globulin Ratio: 2.3 — ABNORMAL HIGH (ref 1.2–2.2)
Albumin: 4.5 g/dL (ref 3.6–4.6)
Alkaline Phosphatase: 88 IU/L (ref 39–117)
BUN/Creatinine Ratio: 29 — ABNORMAL HIGH (ref 12–28)
BUN: 26 mg/dL (ref 8–27)
Bilirubin Total: 0.3 mg/dL (ref 0.0–1.2)
CO2: 25 mmol/L (ref 20–29)
Calcium: 9.7 mg/dL (ref 8.7–10.3)
Chloride: 100 mmol/L (ref 96–106)
Creatinine, Ser: 0.91 mg/dL (ref 0.57–1.00)
GFR calc Af Amer: 67 mL/min/{1.73_m2} (ref >59)
GFR calc non Af Amer: 58 mL/min/{1.73_m2} — ABNORMAL LOW (ref >59)
Globulin, Total: 2 g/dL (ref 1.5–4.5)
Glucose: 85 mg/dL (ref 65–99)
Potassium: 5.3 mmol/L — ABNORMAL HIGH (ref 3.5–5.2)
Sodium: 138 mmol/L (ref 134–144)
Total Protein: 6.5 g/dL (ref 6.0–8.5)

## 2019-06-20 LAB — LIPID PANEL
Chol/HDL Ratio: 2.6 ratio (ref 0.0–4.4)
Cholesterol, Total: 170 mg/dL (ref 100–199)
HDL: 65 mg/dL (ref >39)
LDL Chol Calc (NIH): 89 mg/dL (ref 0–99)
Triglycerides: 90 mg/dL (ref 0–149)
VLDL Cholesterol Cal: 16 mg/dL (ref 5–40)

## 2019-06-20 LAB — T4, FREE: Free T4: 1.73 ng/dL (ref 0.82–1.77)

## 2019-06-20 LAB — TSH: TSH: 1.32 u[IU]/mL (ref 0.450–4.500)

## 2019-06-29 ENCOUNTER — Telehealth: Payer: Self-pay | Admitting: Cardiology

## 2019-06-29 ENCOUNTER — Other Ambulatory Visit: Payer: Self-pay

## 2019-06-29 ENCOUNTER — Ambulatory Visit (HOSPITAL_COMMUNITY): Payer: Medicare Other | Attending: Cardiovascular Disease

## 2019-06-29 DIAGNOSIS — E039 Hypothyroidism, unspecified: Secondary | ICD-10-CM | POA: Diagnosis not present

## 2019-06-29 DIAGNOSIS — I447 Left bundle-branch block, unspecified: Secondary | ICD-10-CM

## 2019-06-29 DIAGNOSIS — Z950 Presence of cardiac pacemaker: Secondary | ICD-10-CM | POA: Diagnosis not present

## 2019-06-29 DIAGNOSIS — I5022 Chronic systolic (congestive) heart failure: Secondary | ICD-10-CM | POA: Diagnosis not present

## 2019-06-29 DIAGNOSIS — R5383 Other fatigue: Secondary | ICD-10-CM

## 2019-06-29 NOTE — Telephone Encounter (Signed)
    Patient calling for lab results 

## 2019-06-29 NOTE — Telephone Encounter (Signed)
I spoke with patient and reviewed lab results with her. She only takes potassium when taking lasix. Takes very infrequently. I told her she no longer needed to take potassium.

## 2019-07-02 ENCOUNTER — Telehealth: Payer: Self-pay

## 2019-07-02 NOTE — Telephone Encounter (Signed)
Spoke to patient echo results given.She stated she wanted Dr.Jordan to know appox 2 weeks ago she was unlocking front door she lost her balance and fell,hitting head on ground.Stated no bleeding.No hematoma.Stated she had a slight headache for a couple of hours.Stated she feels fine now.Advised I will make Dr.Jordan aware.

## 2019-07-04 ENCOUNTER — Other Ambulatory Visit: Payer: Self-pay | Admitting: Internal Medicine

## 2019-07-04 NOTE — Telephone Encounter (Signed)
rx sent to pharmacy by e-script  

## 2019-07-12 ENCOUNTER — Telehealth: Payer: Self-pay | Admitting: Cardiology

## 2019-07-12 ENCOUNTER — Other Ambulatory Visit: Payer: Self-pay | Admitting: Cardiology

## 2019-07-12 NOTE — Telephone Encounter (Signed)
Copy of labs mailed  

## 2019-07-12 NOTE — Telephone Encounter (Signed)
The patient would like Malachy Mood to send her a copy of her recent lab results. She forgot to ask for one when she was in the office last.

## 2019-07-26 ENCOUNTER — Ambulatory Visit: Payer: Medicare Other | Admitting: Cardiology

## 2019-08-08 DIAGNOSIS — D1801 Hemangioma of skin and subcutaneous tissue: Secondary | ICD-10-CM | POA: Diagnosis not present

## 2019-08-08 DIAGNOSIS — L821 Other seborrheic keratosis: Secondary | ICD-10-CM | POA: Diagnosis not present

## 2019-08-08 DIAGNOSIS — I8392 Asymptomatic varicose veins of left lower extremity: Secondary | ICD-10-CM | POA: Diagnosis not present

## 2019-08-08 DIAGNOSIS — D3617 Benign neoplasm of peripheral nerves and autonomic nervous system of trunk, unspecified: Secondary | ICD-10-CM | POA: Diagnosis not present

## 2019-08-08 DIAGNOSIS — I8391 Asymptomatic varicose veins of right lower extremity: Secondary | ICD-10-CM | POA: Diagnosis not present

## 2019-08-08 DIAGNOSIS — Z85820 Personal history of malignant melanoma of skin: Secondary | ICD-10-CM | POA: Diagnosis not present

## 2019-08-08 DIAGNOSIS — D225 Melanocytic nevi of trunk: Secondary | ICD-10-CM | POA: Diagnosis not present

## 2019-08-13 DIAGNOSIS — N390 Urinary tract infection, site not specified: Secondary | ICD-10-CM | POA: Diagnosis not present

## 2019-08-13 DIAGNOSIS — R350 Frequency of micturition: Secondary | ICD-10-CM | POA: Diagnosis not present

## 2019-08-14 ENCOUNTER — Other Ambulatory Visit: Payer: Self-pay | Admitting: Internal Medicine

## 2019-08-16 ENCOUNTER — Other Ambulatory Visit: Payer: Self-pay | Admitting: *Deleted

## 2019-08-16 MED ORDER — RABEPRAZOLE SODIUM 20 MG PO TBEC: DELAYED_RELEASE_TABLET | ORAL | 0 refills | Status: DC

## 2019-08-16 NOTE — Telephone Encounter (Signed)
Patient requested refill.  Informed patient that she would would need an appointment. She will call back to schedule.

## 2019-08-27 ENCOUNTER — Other Ambulatory Visit: Payer: Self-pay | Admitting: *Deleted

## 2019-08-27 ENCOUNTER — Other Ambulatory Visit: Payer: Self-pay | Admitting: Internal Medicine

## 2019-08-27 ENCOUNTER — Other Ambulatory Visit: Payer: Self-pay

## 2019-08-27 MED ORDER — RABEPRAZOLE SODIUM 20 MG PO TBEC: DELAYED_RELEASE_TABLET | ORAL | 0 refills | Status: DC

## 2019-08-27 MED ORDER — RABEPRAZOLE SODIUM 20 MG PO TBEC: DELAYED_RELEASE_TABLET | ORAL | 1 refills | Status: DC

## 2019-08-27 NOTE — Telephone Encounter (Signed)
[facility] Patient stated that they did not receive Rx.  Refaxed.

## 2019-08-28 MED ORDER — RABEPRAZOLE SODIUM 20 MG PO TBEC: DELAYED_RELEASE_TABLET | ORAL | 1 refills | Status: DC

## 2019-08-28 NOTE — Addendum Note (Signed)
Addended by: Rafael Bihari A on: 08/28/2019 08:32 AM   Modules accepted: Orders

## 2019-08-29 DIAGNOSIS — R350 Frequency of micturition: Secondary | ICD-10-CM | POA: Diagnosis not present

## 2019-08-29 DIAGNOSIS — Z681 Body mass index (BMI) 19 or less, adult: Secondary | ICD-10-CM | POA: Diagnosis not present

## 2019-08-29 DIAGNOSIS — Z01419 Encounter for gynecological examination (general) (routine) without abnormal findings: Secondary | ICD-10-CM | POA: Diagnosis not present

## 2019-09-14 ENCOUNTER — Telehealth: Payer: Self-pay | Admitting: Internal Medicine

## 2019-09-14 NOTE — Telephone Encounter (Signed)
Called and spoke with pt who stated she began to cough 2-3 weeks ago. Pt stated that she does not cough all the time but thinks it could be drainage from sinuses. Pt stated that she has taken allegra to help with symptoms. Pt also states that she has be hoarse.  Pt is coughing up clear to yellow phlegm. Pt has had some wheezing and some congestion. Pt denies any chest discomfort.  Stated to pt that since we have not seen her since 11/2017 we would need her to be seen in office prior to [place] prescribing meds for her and she verbalized understanding. Appt has been scheduled for pt with Aaron Edelman 7/16. Stated to pt if she became worse between now and her appt to either contact PCP, go to either UC or ED and she verbalized understanding. Nothing further needed.

## 2019-09-19 DIAGNOSIS — N390 Urinary tract infection, site not specified: Secondary | ICD-10-CM | POA: Diagnosis not present

## 2019-09-19 DIAGNOSIS — R350 Frequency of micturition: Secondary | ICD-10-CM | POA: Diagnosis not present

## 2019-09-21 ENCOUNTER — Ambulatory Visit: Payer: Medicare Other | Admitting: Pulmonary Disease

## 2019-10-19 ENCOUNTER — Telehealth: Payer: Self-pay

## 2019-10-19 ENCOUNTER — Telehealth: Payer: Self-pay | Admitting: Orthopaedic Surgery

## 2019-10-19 ENCOUNTER — Telehealth: Payer: Self-pay | Admitting: Orthopedic Surgery

## 2019-10-19 NOTE — Telephone Encounter (Signed)
Appt with Dr. Ninfa Linden on 8/23 at 3:30

## 2019-10-19 NOTE — Telephone Encounter (Signed)
Patient called and requesting a appt with Dr. Ninfa Linden. Patient is a seen by Dr. Sharol Given but wants to be seen by Dr. Ninfa Linden for her back and hip pains. Spoke with Dr. Sharol Given staff for approval to see Dr. Ninfa Linden. Patient states to be in severe pain. Please call patient to open slot to set an earlier appt. Please call patient at [phone].

## 2019-10-19 NOTE — Telephone Encounter (Signed)
The pt called to get schedule for her 6 months check up. I asked [name] and we were able to get her scheduled 11-28-2019 at 3:30 pm.

## 2019-10-29 ENCOUNTER — Ambulatory Visit (INDEPENDENT_AMBULATORY_CARE_PROVIDER_SITE_OTHER): Payer: Medicare Other

## 2019-10-29 ENCOUNTER — Ambulatory Visit (INDEPENDENT_AMBULATORY_CARE_PROVIDER_SITE_OTHER): Payer: Medicare Other | Admitting: Orthopaedic Surgery

## 2019-10-29 ENCOUNTER — Encounter: Payer: Self-pay | Admitting: Orthopaedic Surgery

## 2019-10-29 DIAGNOSIS — M5440 Lumbago with sciatica, unspecified side: Secondary | ICD-10-CM | POA: Diagnosis not present

## 2019-10-29 DIAGNOSIS — M25561 Pain in right knee: Secondary | ICD-10-CM | POA: Diagnosis not present

## 2019-10-29 DIAGNOSIS — G8929 Other chronic pain: Secondary | ICD-10-CM | POA: Diagnosis not present

## 2019-10-29 MED ORDER — METHYLPREDNISOLONE 4 MG PO TABS: ORAL_TABLET | ORAL | 0 refills | Status: DC

## 2019-10-29 MED ORDER — METHYLPREDNISOLONE ACETATE 40 MG/ML IJ SUSP
40.0000 mg | INTRAMUSCULAR | Status: AC | PRN
  Administered 2019-10-29: 40 mg via INTRA_ARTICULAR

## 2019-10-29 MED ORDER — LIDOCAINE HCL 1 % IJ SOLN
3.0000 mL | INTRAMUSCULAR | Status: AC | PRN
  Administered 2019-10-29: 3 mL

## 2019-10-29 NOTE — Progress Notes (Signed)
Office Visit Note   Patient: [name]           Date of Birth: 11/12/1932           MRN: [id number] Visit Date: 10/29/2019              Requested by: Gayland Curry, DO [place],   [place] [id number] PCP: Gayland Curry, DO   Assessment & Plan: Visit Diagnoses:  1. Chronic bilateral low back pain with sciatica, sciatica laterality unspecified   2. Chronic pain of right knee     Plan: She is not really interested in any type of physical therapy or even a knee brace.  I did recommend at least a steroid injection of the right knee and she tolerated this well.  I offered her physical therapy again for her back or her knee and she is declining this.  I have also sent her in a 6-day steroid taper.  She would rather not take any other medications.  If she continues to have symptoms, we would need a CT myelogram of the lumbar spine and even just plain films of the right knee, however, she has not interested in any type of surgical intervention even on her knee.  All question concerns were answered and addressed.  Follow-up can be as needed.  Follow-Up Instructions: Return if symptoms worsen or fail to improve.   Orders:  Orders Placed This Encounter  Procedures  . Large Joint Inj  . XR Lumbar Spine 2-3 Views   Meds ordered this encounter  Medications  . methylPREDNISolone (MEDROL) 4 MG tablet    Sig: Medrol dose pack. Take as instructed    Dispense:  21 tablet    Refill:  0      Procedures: Large Joint Inj: R knee on 10/29/2019 7:08 PM Indications: diagnostic evaluation and pain Details: 22 G 1.5 in needle, superolateral approach  Arthrogram: No  Medications: 3 mL lidocaine 1 %; 40 mg methylPREDNISolone acetate 40 MG/ML Outcome: tolerated well, no immediate complications Procedure, treatment alternatives, risks and benefits explained, specific risks discussed. Consent was given by the patient. Immediately prior to procedure a time out was called to verify the  correct patient, procedure, equipment, support staff and site/side marked as required. Patient was prepped and draped in the usual sterile fashion.       Clinical Data: No additional findings.   Subjective: Chief Complaint  Patient presents with  . Lower Back - Pain  The patient is a very pleasant 86 year old female who comes in with a chief complaint of low back pain that comes and goes but also her right leg pain.  She reports potentially sciatica.  This is been going on for a few years.  She also has significant right knee pain and swelling.  She denies any hip and groin pain.  She has had physical therapy before not sure if that really helped her at all.  She says it is getting harder for her to walk.  I did review her medical records and she is on a significant amount of medications including being on Xarelto, so she cannot take anti-inflammatories.  She does not walk with any assistive device.  She does feel like her right knee will give out.  She has tried a knee sleeve and Ace wrap and that has not really helped.  HPI  Review of Systems She currently denies any headache, chest pain, shortness of breath, fever, chills, nausea, vomiting  Objective:  Vital Signs: There were no vitals taken for this visit.  Physical Exam She is alert and orient x3 and in no acute distress Ortho Exam Examination of her right hip shows that is normal on exam.  Her right knee does have a moderate effusion and she lacks full flexion extension due to pain in the knee.  She has negative straight leg raise on the right side. Specialty Comments:  No specialty comments available.  Imaging: XR Lumbar Spine 2-3 Views  Result Date: 10/29/2019 2 views of the lumbar spine show significant arthritic changes at multiple levels throughout the lumbar spine with also malalignment in the AP and lateral planes.    PMFS History: Patient Active Problem List   Diagnosis Date Noted  . Incisional hernia 09/07/2018    . Coronary atherosclerosis due to calcified coronary lesion of native artery 12/09/2017  . Paroxysmal atrial fibrillation ([place]) 11/07/2015  . Lumbago 08/20/2015  . Kyphosis 08/20/2015  . Insomnia 08/20/2015  . Aortic atherosclerosis ([place]) 08/20/2015  . Abdominal bruit 08/20/2015  . Aortic ectasia ([place]) 08/20/2015  . AAA (abdominal aortic aneurysm) without rupture ([place]) 08/20/2015  . B12 deficiency 05/21/2015  . HTN (hypertension) 11/19/2014  . Cardiomyopathy, dilated, nonischemic ([place]) 07/18/2014  . Biventricular cardiac pacemaker in situ 04/24/2014  . Chronic cough 04/17/2014  . Aneurysm of iliac artery ([facility]) 04/17/2014  . Incisional hernia, without obstruction or gangrene 11/21/2013  . Frequency of urination 11/21/2013  . Malaise and fatigue 05/08/2013  . Raynaud disease 05/08/2013  . Moderate malnutrition ([place]) 12/24/2012  . Hypothyroidism   . Hair thinning   . Anxiety disorder   . Hyperlipidemia   . Sinusitis, chronic   . Chronic bronchitis ([place])   . Depression   . Restless legs syndrome (RLS)   . PVC (premature ventricular contraction) 06/08/2011  . GERD 02/03/2009  . Chronic systolic heart failure ([place]) 12/12/2008  . BARRETT'S ESOPHAGUS 05/29/2008  . Gastritis and gastroduodenitis 05/29/2008   Past Medical History:  Diagnosis Date  . AAA (abdominal aortic aneurysm) ([facility])    2.8 cm 05/2018; 5 year [place] per ACR guidelines  . Allergy    all year   . Anxiety disorder   . Arthritis   . Barrett's esophagus 04/2008  . Benign neoplasm of colon   . Blood transfusion without reported diagnosis    long ago per pt  . Cataract    removed both eyes  . CHF (congestive heart failure) ([place])   . Chronic airway obstruction, not elsewhere classified   . Complication of anesthesia    slow to wake up  . Depressive disorder, not elsewhere classified   . Diverticulitis   . Dysthymic disorder   . Erosive gastritis 08/2004  . GERD (gastroesophageal reflux disease)   . Hair thinning    . HTN (hypertension)    pt denies   . Hyperplastic polyps of stomach 06/2002  . Hypothyroidism   . ICD (implantable cardiac defibrillator) in place    BiV/ICD; s/p removal of ICD with insertion of BiV PPM 04/26/12  . Incisional hernia   . Insomnia 08/20/2015  . Kyphosis 08/20/2015  . LBBB (left bundle branch block)   . Melanoma ([place])    right arm  . Mitral regurgitation   . Nonischemic cardiomyopathy ([place])    EF initially 20%; Last measurement up to 50% per echo in August of 2012  . Osteopenia   . Other and unspecified hyperlipidemia   . Other and unspecified hyperlipidemia   . Pacemaker 04/26/2012  .  PAF (paroxysmal atrial fibrillation) ([place])    11/2016  . Pain in joint, lower leg   . Palpitations   . Pneumonia   . Restless legs syndrome (RLS)   . Synovial cyst of popliteal space   . Unspecified chronic bronchitis ([place])   . Unspecified nasal polyp   . Unspecified sinusitis (chronic)     Family History  Problem Relation Age of Onset  . Stroke Father   . Heart disease Other        maternal side  . Colon cancer Paternal Uncle   . Breast cancer Maternal Aunt   . Colon cancer Cousin   . Diabetes Neg Hx   . Colon polyps Neg Hx   . Rectal cancer Neg Hx   . Stomach cancer Neg Hx     Past Surgical History:  Procedure Laterality Date  . ABDOMINAL HYSTERECTOMY  1076   endometriosis  . APPENDECTOMY    . BI-VENTRICULAR PACEMAKER INSERTION N/A 04/26/2012   Procedure: BI-VENTRICULAR PACEMAKER INSERTION (CRT-P);  Surgeon: Evans Lance, MD;  Location[facility] CATH LAB;  Service: Cardiovascular;  Laterality: N/A;  . BIV PACEMAKER GENERATOR CHANGEOUT N/A 01/26/2019   Procedure: BIV PACEMAKER GENERATOR CHANGEOUT;  Surgeon: Evans Lance, MD;  Location: [facility] CV LAB;  Service: Cardiovascular;  Laterality: N/A;  . COLONOSCOPY    . defibrillator insertion     s/p removal of previously implanted BiV ICD and insertion of a new BiV pacemaker on 04/26/12  . excise mole of lip  2001   Dr.  Larena Sox  . excision of wen  1984   Dr. Parks Ranger  . FEMORAL HERNIA REPAIR  12/25/2012   Dr Dalbert Batman  . FEMORAL HERNIA REPAIR  01/04/2013   Recurrent - Dr. Lilyan Punt  . INCISIONAL HERNIA REPAIR N/A 10/01/2013   Procedure: LAPAROSCOPIC INCISIONAL HERNIA;  Surgeon: Gayland Curry, MD;  Location: [facility];  Service: General;  Laterality: N/A;  . INCISIONAL HERNIA REPAIR N/A 09/07/2018   Procedure: LAPAROSCOPIC ASSISTED REPAIR OF INCISIONAL HERNIA;  Surgeon: Greer Pickerel, MD;  Location: [facility];  Service: General;  Laterality: N/A;  . INGUINAL HERNIA REPAIR Right 09/26/2012   Procedure: RIGHT INGUINAL HERNIA REPAIR WITH MESH;  Surgeon: Odis Hollingshead, MD;  Location: [facility];  Service: General;  Laterality: Right;  . INGUINAL HERNIA REPAIR Right 12/25/2012   Procedure: explor right groin, small bowel rescection, tissue repair right femoral hernia;  Surgeon: Adin Hector, MD;  Location: [facility];  Service: General;  Laterality: Right;  . INSERTION OF MESH Right 09/26/2012   Procedure: INSERTION OF MESH;  Surgeon: Odis Hollingshead, MD;  Location: [facility];  Service: General;  Laterality: Right;  . INSERTION OF MESH N/A 10/01/2013   Procedure: INSERTION OF MESH;  Surgeon: Gayland Curry, MD;  Location: [facility];  Service: General;  Laterality: N/A;  . LAPAROSCOPY N/A 01/04/2013   Procedure: Diagnostic Laparoscopy, exploratory laparotomy with small bowel resection, closure of right femoral hernia repair;  Surgeon: Madilyn Hook, DO;  Location: [facility];  Service: General;  Laterality: N/A;  . Left arm surgery    . POLYPECTOMY    . RIGHT BREAST LUMPECTOMY  1988   Dr. Marylene Buerger  . right knee surgery     arthroscopy: Dr. Shellia Carwin  . TONSILLECTOMY    . UPPER GASTROINTESTINAL ENDOSCOPY     Social History   Occupational History  . Occupation: Retired    Fish farm manager: RETIRED  Tobacco Use  . Smoking status: Former Smoker  Packs/day: 1.00    Years: 15.00    Pack years: 15.00    Types: Cigarettes    Quit date:  12/22/1993    Years since quitting: 25.8  . Smokeless tobacco: Never Used  . Tobacco comment: Does'nt reall year quit   Vaping Use  . Vaping Use: Never used  Substance and Sexual Activity  . Alcohol use: No    Alcohol/week: 0.0 standard drinks  . Drug use: No  . Sexual activity: Never

## 2019-11-09 LAB — HM MAMMOGRAPHY

## 2019-11-19 DIAGNOSIS — Z1231 Encounter for screening mammogram for malignant neoplasm of breast: Secondary | ICD-10-CM | POA: Diagnosis not present

## 2019-11-19 LAB — HM MAMMOGRAPHY

## 2019-11-22 ENCOUNTER — Other Ambulatory Visit: Payer: Self-pay

## 2019-11-22 ENCOUNTER — Encounter: Payer: Self-pay | Admitting: Internal Medicine

## 2019-11-22 ENCOUNTER — Ambulatory Visit (INDEPENDENT_AMBULATORY_CARE_PROVIDER_SITE_OTHER): Payer: Medicare Other | Admitting: Internal Medicine

## 2019-11-22 VITALS — BP 120/72 | HR 65 | Temp 97.5°F | Ht 68.0 in | Wt 125.5 lb

## 2019-11-22 DIAGNOSIS — E440 Moderate protein-calorie malnutrition: Secondary | ICD-10-CM

## 2019-11-22 DIAGNOSIS — R636 Underweight: Secondary | ICD-10-CM

## 2019-11-22 DIAGNOSIS — E538 Deficiency of other specified B group vitamins: Secondary | ICD-10-CM

## 2019-11-22 DIAGNOSIS — D508 Other iron deficiency anemias: Secondary | ICD-10-CM

## 2019-11-22 DIAGNOSIS — J418 Mixed simple and mucopurulent chronic bronchitis: Secondary | ICD-10-CM

## 2019-11-22 DIAGNOSIS — Z23 Encounter for immunization: Secondary | ICD-10-CM

## 2019-11-22 DIAGNOSIS — I2510 Atherosclerotic heart disease of native coronary artery without angina pectoris: Secondary | ICD-10-CM

## 2019-11-22 DIAGNOSIS — E039 Hypothyroidism, unspecified: Secondary | ICD-10-CM | POA: Diagnosis not present

## 2019-11-22 DIAGNOSIS — I2584 Coronary atherosclerosis due to calcified coronary lesion: Secondary | ICD-10-CM | POA: Diagnosis not present

## 2019-11-22 NOTE — Patient Instructions (Signed)
Call the pharmacy to check on brands of wellbutrin.  Let me know if you want brand necessary (cost?).

## 2019-11-22 NOTE — Progress Notes (Signed)
Location:  [facility] clinic Provider:  Karlye Ihrig L. Mariea Clonts, D.O., C.M.D.  Goals of Care:  Advanced Directives 11/22/2019  Does Patient Have a Medical Advance Directive? Yes  Type of Paramedic of [place];Living will  Does patient want to make changes to medical advance directive? No - Guardian declined  Copy of [place] in Chart? Yes - validated most recent copy scanned in chart (See row information)  Would patient like information on creating a medical advance directive? -  Pre-existing out of facility DNR order (yellow form or pink MOST form) -     Chief Complaint  Patient presents with  . Medical Management of Chronic Issues    Follow up appointment with labs to follow   . Acute Visit    medication questions about Medrol and getting flu shot  . Health Maintenance    Influenza and PNA     HPI: Patient is a 86 y.o. female seen today for medical management of chronic diseases.    6 she's old, ugly and ready to give up. Had echo with Dr. [name] was ok. Went to Dr. Verlon Au and had a UTI.  Having a terrible time with her back and right side of her knee.  She's been ordered therapy, but says it's too hard for her to go and then there's covid.  She then went to Dr. Ninfa Linden.  It turned out she had arthritis in her right knee.  That's much better now.  She also was given prednisone but didn't take it.   The back still hurts her.  It might start in the am, or noon or night or not at all on occasion.  Takes tylenol.  She has her head forward posture.  Discussed that best thing for that is therapy.    She was wanting her labs checked.  She quit taking iron.  She is eating frosted miniwheats to get her iron up.    She still is not doing anything but talking on the phone, watching tv and sleeping.  Suggested again that she take wellbutrin.  She also has to urinate a lot at night so does not sleep well.  Says Dr. Nori Riis gave her some samples of medication  before but she had side effects.    She has a history of hernias.  She is feeling something in her right lower abdomen--no lumps, but will have sharp pain.  She has not noted a relation to her BMs.    She had her covid vaccines.  She's had 4 covid tests--last was 8/26 and normal.  Agrees to flu vaccine today.  Past Medical History:  Diagnosis Date  . AAA (abdominal aortic aneurysm) (HCC)    2.8 cm 05/2018; 5 year [place] per ACR guidelines  . Allergy    all year   . Anxiety disorder   . Arthritis   . Barrett's esophagus 04/2008  . Benign neoplasm of colon   . Blood transfusion without reported diagnosis    long ago per pt  . Cataract    removed both eyes  . CHF (congestive heart failure) ([place])   . Chronic airway obstruction, not elsewhere classified   . Complication of anesthesia    slow to wake up  . Depressive disorder, not elsewhere classified   . Diverticulitis   . Dysthymic disorder   . Erosive gastritis 08/2004  . GERD (gastroesophageal reflux disease)   . Hair thinning   . HTN (hypertension)    pt denies   .  Hyperplastic polyps of stomach 06/2002  . Hypothyroidism   . ICD (implantable cardiac defibrillator) in place    BiV/ICD; s/p removal of ICD with insertion of BiV PPM 04/26/12  . Incisional hernia   . Insomnia 08/20/2015  . Kyphosis 08/20/2015  . LBBB (left bundle branch block)   . Melanoma ([place])    right arm  . Mitral regurgitation   . Nonischemic cardiomyopathy ([place])    EF initially 20%; Last measurement up to 50% per echo in August of 2012  . Osteopenia   . Other and unspecified hyperlipidemia   . Other and unspecified hyperlipidemia   . Pacemaker 04/26/2012  . PAF (paroxysmal atrial fibrillation) ([place])    11/2016  . Pain in joint, lower leg   . Palpitations   . Pneumonia   . Restless legs syndrome (RLS)   . Synovial cyst of popliteal space   . Unspecified chronic bronchitis ([place])   . Unspecified nasal polyp   . Unspecified sinusitis (chronic)     Past  Surgical History:  Procedure Laterality Date  . ABDOMINAL HYSTERECTOMY  1076   endometriosis  . APPENDECTOMY    . BI-VENTRICULAR PACEMAKER INSERTION N/A 04/26/2012   Procedure: BI-VENTRICULAR PACEMAKER INSERTION (CRT-P);  Surgeon: Evans Lance, MD;  Location[facility] C[facility] LAB;  Service: Cardiovascular;  Laterality: N/A;  . BIV PACEMAKER GENERATOR CHANGEOUT N/A 01/26/2019   Procedure: BIV PACEMAKER GENERATOR CHANGEOUT;  Surgeon: Evans Lance, MD;  Location: [place] CV LAB;  Service: Cardiovascular;  Laterality: N/A;  . COLONOSCOPY    . defibrillator insertion     s/p removal of previously implanted BiV ICD and insertion of a new BiV pacemaker on 04/26/12  . excise mole of lip  2001   Dr. Larena Sox  . excision of wen  1984   Dr. Parks Ranger  . FEMORAL HERNIA REPAIR  12/25/2012   Dr Dalbert Batman  . FEMORAL HERNIA REPAIR  01/04/2013   Recurrent - Dr. Lilyan Punt  . INCISIONAL HERNIA REPAIR N/A 10/01/2013   Procedure: LAPAROSCOPIC INCISIONAL HERNIA;  Surgeon: Gayland Curry, MD;  Location: [facility];  Service: General;  Laterality: N/A;  . INCISIONAL HERNIA REPAIR N/A 09/07/2018   Procedure: LAPAROSCOPIC ASSISTED REPAIR OF INCISIONAL HERNIA;  Surgeon: Greer Pickerel, MD;  Location: [place];  Service: General;  Laterality: N/A;  . INGUINAL HERNIA REPAIR Right 09/26/2012   Procedure: RIGHT INGUINAL HERNIA REPAIR WITH MESH;  Surgeon: Odis Hollingshead, MD;  Location: [place];  Service: General;  Laterality: Right;  . INGUINAL HERNIA REPAIR Right 12/25/2012   Procedure: explor right groin, small bowel rescection, tissue repair right femoral hernia;  Surgeon: Adin Hector, MD;  Location: [facility];  Service: General;  Laterality: Right;  . INSERTION OF MESH Right 09/26/2012   Procedure: INSERTION OF MESH;  Surgeon: Odis Hollingshead, MD;  Location: [place];  Service: General;  Laterality: Right;  . INSERTION OF MESH N/A 10/01/2013   Procedure: INSERTION OF MESH;  Surgeon: Gayland Curry, MD;  Location: [facility];  Service:  General;  Laterality: N/A;  . LAPAROSCOPY N/A 01/04/2013   Procedure: Diagnostic Laparoscopy, exploratory laparotomy with small bowel resection, closure of right femoral hernia repair;  Surgeon: Madilyn Hook, DO;  Location: [facility];  Service: General;  Laterality: N/A;  . Left arm surgery    . POLYPECTOMY    . RIGHT BREAST LUMPECTOMY  1988   Dr. Marylene Buerger  . right knee surgery     arthroscopy: Dr. Shellia Carwin  . TONSILLECTOMY    .  UPPER GASTROINTESTINAL ENDOSCOPY      Allergies  Allergen Reactions  . Hydrocodone Itching  . Cephalexin Itching and Swelling  . Ciprofloxacin     Not indicated due to aneurysm in aorta    . Doxycycline Other (See Comments)    Per pt: unknown  . Escitalopram     Dry mouth  . Levofloxacin     Not indicated due to aneurysm in aorta   . Moxifloxacin     Not indicated due to aneurysm in aorta   . Ofloxacin Other (See Comments)    Per pt: unknown  . Sertraline     insomnia  . Sulfamethoxazole Hives    Any sulfa meds.  . Tape Other (See Comments)    Burns skin.   . Latex Rash    "If pt. Makes contact with or wearing"  . Neomycin Rash    Outpatient Encounter Medications as of 11/22/2019  Medication Sig  . acetaminophen (TYLENOL) 500 MG tablet Take 1,000 mg by mouth every 8 (eight) hours as needed. For pain   . Ascorbic Acid (VITAMIN C) 1000 MG tablet Take 1,000 mg by mouth at bedtime.   . Biotin 5000 MCG TABS Take 1 tablet by mouth daily.  . Calcium Carbonate-Vitamin D (CALCIUM + D PO) Take 2 tablets by mouth daily. 1000 units Vitamin D 1200 mg calcium  . Cyanocobalamin (VITAMIN B 12) 500 MCG TABS Take 1,000 mcg by mouth daily.   Marland Kitchen estradiol (ESTRACE) 0.1 MG/GM vaginal cream Place 1 g vaginally 2 (two) times a week.  Marland Kitchen FERREX 150 150 MG capsule TAKE 1 CAPSULE BY MOUTH EVERY DAY  . fexofenadine (ALLEGRA) 180 MG tablet Take 180 mg by mouth daily as needed for allergies.   . furosemide (LASIX) 20 MG tablet Take 1 tablet (20 mg total) by mouth daily as  needed for fluid or edema.  . methylPREDNISolone (MEDROL) 4 MG tablet Medrol dose pack. Take as instructed  . metoprolol tartrate (LOPRESSOR) 25 MG tablet Take one tablet by mouth as needed for chest pain.  Maximum 2 tablets in one day.  Vladimir Faster Glycol-Propyl Glycol (SYSTANE) 0.4-0.3 % SOLN Place 1 drop into both eyes daily.  . RABEprazole (ACIPHEX) 20 MG tablet Take one tablet by mouth once daily for stomach  . Rivaroxaban (XARELTO) 15 MG TABS tablet Take 1 tablet (15 mg total) by mouth daily with supper.  Marland Kitchen SYNTHROID 100 MCG tablet TAKE 1 TABLET BY MOUTH DAILY BEFORE BREAKFAST  . trandolapril (MAVIK) 2 MG tablet TAKE 1 TABLET BY MOUTH TWICE A DAY  . [DISCONTINUED] buPROPion (WELLBUTRIN XL) 150 MG 24 hr tablet TAKE 1 TABLET BY MOUTH EVERY DAY  . [DISCONTINUED] folic acid (FOLVITE) 914 MCG tablet Take 400 mcg by mouth daily as needed (restless leg).  . [DISCONTINUED] Multiple Vitamin (MULTIVITAMIN WITH MINERALS) TABS Take 1 tablet by mouth daily.   Facility-Administered Encounter Medications as of 11/22/2019  Medication  . 0.9 %  sodium chloride infusion    Review of Systems:  Review of Systems  Constitutional: Positive for malaise/fatigue. Negative for chills and fever.  HENT: Negative for congestion and sore throat.   Eyes: Negative for blurred vision.  Respiratory: Negative for cough and shortness of breath.   Cardiovascular: Negative for chest pain, palpitations and leg swelling.  Gastrointestinal: Positive for abdominal pain. Negative for blood in stool, constipation, diarrhea and melena.  Genitourinary: Positive for frequency. Negative for dysuria.       At night only  Musculoskeletal: Negative for  falls.       Kyphosis  Skin: Negative for rash.  Neurological: Positive for weakness. Negative for dizziness and loss of consciousness.  Endo/Heme/Allergies: Bruises/bleeds easily.  Psychiatric/Behavioral: Positive for depression. Negative for memory loss. The patient is  nervous/anxious and has insomnia.        Still has not started wellbutrin after reading a [facility] article that mentions the generic may cause palpitations    Health Maintenance  Topic Date Due  . PNA vac Low Risk Adult (2 of 2 - PCV13) 03/13/2009  . INFLUENZA VACCINE  10/07/2019  . MAMMOGRAM  11/14/2020  . TETANUS/TDAP  03/15/2021  . DEXA SCAN  Completed  . COVID-19 Vaccine  Completed    Physical Exam: Vitals:   11/22/19 1548  BP: 120/72  Pulse: 65  Temp: (!) 97.5 F (36.4 C)  SpO2: 99%  Weight: 125 lb 8 oz (56.9 kg)  Height: 5\' 8"  (1.727 m)   Body mass index is 19.08 kg/m. Physical Exam Vitals reviewed.  Constitutional:      General: She is not in acute distress.    Appearance: She is not toxic-appearing.     Comments: Thin female, nad  Cardiovascular:     Rate and Rhythm: Normal rate and regular rhythm.     Pulses: Normal pulses.     Heart sounds: Normal heart sounds.  Pulmonary:     Effort: Pulmonary effort is normal.     Breath sounds: Normal breath sounds.  Abdominal:     General: Bowel sounds are normal. There is no distension.     Palpations: Abdomen is soft. There is no mass.     Tenderness: There is no abdominal tenderness. There is no guarding or rebound.     Hernia: No hernia is present.  Musculoskeletal:        General: Normal range of motion.     Right lower leg: No edema.     Left lower leg: No edema.     Comments: Kyphotic posture  Skin:    General: Skin is warm and dry.  Neurological:     General: No focal deficit present.     Mental Status: She is alert and oriented to person, place, and time.     Motor: No weakness.     Gait: Gait normal.  Psychiatric:        Mood and Affect: Mood normal.     Comments: Usual apathy     Labs reviewed: Basic Metabolic Panel: Recent Labs    01/11/19 1650 02/07/19 1633 06/19/19 1630  NA 136 137 138  K 4.9 5.2 5.3*  CL 101 102 100  CO2 22 22 25   GLUCOSE 89 79 85  BUN 39* 25 26    CREATININE 1.02* 0.87 0.91  CALCIUM 9.2 9.1 9.7  TSH  --   --  1.320   Liver Function Tests: Recent Labs    06/19/19 1630  AST 19  ALT 10  ALKPHOS 88  BILITOT 0.3  PROT 6.5  ALBUMIN 4.5   No results for input(s): LIPASE, AMYLASE in the last 8760 hours. No results for input(s): AMMONIA in the last 8760 hours. CBC: Recent Labs    01/11/19 1650 06/19/19 1630  WBC 6.9 5.7  NEUTROABS  --  3.4  HGB 11.3 12.3  HCT 33.7* 37.2  MCV 87 89  PLT 204 183   Lipid Panel: Recent Labs    06/19/19 1630  CHOL 170  HDL 65  LDLCALC 89  TRIG 90  CHOLHDL 2.6   No results found for: HGBA1C  Procedures since last visit: XR Lumbar Spine 2-3 Views  Result Date: 10/29/2019 2 views of the lumbar spine show significant arthritic changes at multiple levels throughout the lumbar spine with also malalignment in the AP and lateral planes.  Assessment/Plan 1. Underweight -ongoing issue, poor nutrition and does not seem to change this despite counseling  - Vitamin B12 - Magnesium  2. Mixed simple and mucopurulent chronic bronchitis (HCC) -cause of chronic cough, continues on allegra for allergies, not on inhalers  3. Moderate malnutrition ([place]) - remains an issue, f/u labs - COMPLETE METABOLIC PANEL WITH GFR  4. Iron deficiency anemia secondary to inadequate dietary iron intake - has stopped iron and trying to eat more fortified cereal, f/u labs (there are problems with the iron test for [facility] so not ordered) - CBC with Differential/Platelet  5. Hypothyroidism, unspecified type -cont levothyroxine 156mcg daily and f/u lab - TSH  6. Coronary atherosclerosis due to calcified coronary lesion of native artery - no recent symptoms, continues to have smoky odor but denies smoking - Lipid panel  7. B12 deficiency due to diet -f/u lab - Vitamin B12  8. Hypomagnesemia - requesting recheck - Magnesium  9. Need for influenza vaccination -high dose given  Labs/tests ordered:    Lab Orders     CBC with Differential/Platelet     COMPLETE METABOLIC PANEL WITH GFR     Lipid panel     TSH     Vitamin B12     Magnesium  Next appt:  6 mos med mgt   Biviana Saddler L. Gunter Conde, D.O. [facility] [address],  [id number] Cell Phone (Mon-Fri 8am-5pm):  [phone] On Call:  [phone] & follow prompts after 5pm & weekends Office Phone:  [phone] Office Fax:  [phone]

## 2019-11-23 LAB — LIPID PANEL
Cholesterol: 159 mg/dL (ref <200)
HDL: 53 mg/dL (ref >=50)
LDL Cholesterol (Calc): 82 mg/dL (calc)
Non-HDL Cholesterol (Calc): 106 mg/dL (calc) (ref <130)
Total CHOL/HDL Ratio: 3 (calc) (ref <5.0)
Triglycerides: 137 mg/dL (ref <150)

## 2019-11-23 LAB — CBC WITH DIFFERENTIAL/PLATELET
Absolute Monocytes: 667 cells/uL (ref 200–950)
Basophils Absolute: 12 cells/uL (ref 0–200)
Basophils Relative: 0.2 %
Eosinophils Absolute: 277 cells/uL (ref 15–500)
Eosinophils Relative: 4.7 %
HCT: 34.2 % — ABNORMAL LOW (ref 35.0–45.0)
Hemoglobin: 11.4 g/dL — ABNORMAL LOW (ref 11.7–15.5)
Lymphs Abs: 1310 cells/uL (ref 850–3900)
MCH: 30.7 pg (ref 27.0–33.0)
MCHC: 33.3 g/dL (ref 32.0–36.0)
MCV: 92.2 fL (ref 80.0–100.0)
MPV: 10.4 fL (ref 7.5–12.5)
Monocytes Relative: 11.3 %
Neutro Abs: 3634 cells/uL (ref 1500–7800)
Neutrophils Relative %: 61.6 %
Platelets: 167 10*3/uL (ref 140–400)
RBC: 3.71 10*6/uL — ABNORMAL LOW (ref 3.80–5.10)
RDW: 12.7 % (ref 11.0–15.0)
Total Lymphocyte: 22.2 %
WBC: 5.9 10*3/uL (ref 3.8–10.8)

## 2019-11-23 LAB — COMPLETE METABOLIC PANEL WITH GFR
AG Ratio: 1.9 (calc) (ref 1.0–2.5)
ALT: 11 U/L (ref 6–29)
AST: 19 U/L (ref 10–35)
Albumin: 4.1 g/dL (ref 3.6–5.1)
Alkaline phosphatase (APISO): 70 U/L (ref 37–153)
BUN/Creatinine Ratio: 47 (calc) — ABNORMAL HIGH (ref 6–22)
BUN: 44 mg/dL — ABNORMAL HIGH (ref 7–25)
CO2: 25 mmol/L (ref 20–32)
Calcium: 9.1 mg/dL (ref 8.6–10.4)
Chloride: 104 mmol/L (ref 98–110)
Creat: 0.93 mg/dL — ABNORMAL HIGH (ref 0.60–0.88)
GFR, Est African American: 65 mL/min/{1.73_m2} (ref >=60)
GFR, Est Non African American: 56 mL/min/{1.73_m2} — ABNORMAL LOW (ref >=60)
Globulin: 2.2 g/dL (calc) (ref 1.9–3.7)
Glucose, Bld: 81 mg/dL (ref 65–139)
Potassium: 4.9 mmol/L (ref 3.5–5.3)
Sodium: 136 mmol/L (ref 135–146)
Total Bilirubin: 0.4 mg/dL (ref 0.2–1.2)
Total Protein: 6.3 g/dL (ref 6.1–8.1)

## 2019-11-23 LAB — VITAMIN B12: Vitamin B-12: 589 pg/mL (ref 200–1100)

## 2019-11-23 LAB — MAGNESIUM: Magnesium: 2.2 mg/dL (ref 1.5–2.5)

## 2019-11-23 LAB — TSH: TSH: 0.47 mIU/L (ref 0.40–4.50)

## 2019-11-23 NOTE — Progress Notes (Signed)
Anemia is back since stopping iron.  It's still on her med list and she should restart it Thyroid, b12, magnesium all normal Cholesterol is a bit higher than recommended (LDL should be under 70 with her heart disease).  She has not tolerated medication for this so she should follow a low cholesterol diet like the DASH diet or mediterranean diet.

## 2019-11-26 ENCOUNTER — Encounter: Payer: Self-pay | Admitting: Internal Medicine

## 2019-11-27 ENCOUNTER — Encounter: Payer: Self-pay | Admitting: Internal Medicine

## 2019-11-28 ENCOUNTER — Other Ambulatory Visit: Payer: Self-pay

## 2019-11-28 ENCOUNTER — Ambulatory Visit (INDEPENDENT_AMBULATORY_CARE_PROVIDER_SITE_OTHER): Payer: Medicare Other | Admitting: Internal Medicine

## 2019-11-28 ENCOUNTER — Encounter: Payer: Self-pay | Admitting: Internal Medicine

## 2019-11-28 VITALS — BP 122/72 | HR 60 | Ht 68.0 in | Wt 125.8 lb

## 2019-11-28 DIAGNOSIS — I2584 Coronary atherosclerosis due to calcified coronary lesion: Secondary | ICD-10-CM

## 2019-11-28 DIAGNOSIS — I480 Paroxysmal atrial fibrillation: Secondary | ICD-10-CM

## 2019-11-28 DIAGNOSIS — I5022 Chronic systolic (congestive) heart failure: Secondary | ICD-10-CM

## 2019-11-28 DIAGNOSIS — Z950 Presence of cardiac pacemaker: Secondary | ICD-10-CM | POA: Diagnosis not present

## 2019-11-28 DIAGNOSIS — I2510 Atherosclerotic heart disease of native coronary artery without angina pectoris: Secondary | ICD-10-CM | POA: Diagnosis not present

## 2019-11-28 NOTE — Patient Instructions (Signed)

## 2019-11-28 NOTE — Progress Notes (Signed)
HPI [name] returns today for follow-up.  She is a very pleasant 86 year old woman with a history of nonischemic cardiomyopathy, left bundle branch block, status post biventricular device insertion.  She initially had severe LV dysfunction which normalized after biventricular pacing.  She is undergone downgrade to a biventricular pacemaker.  She has had difficulty gaining weight.  She has class II heart failure symptoms.  She denies chest pain or shortness of breath.  No peripheral edema. Allergies  Allergen Reactions  . Hydrocodone Itching  . Cephalexin Itching and Swelling  . Ciprofloxacin     Not indicated due to aneurysm in aorta    . Doxycycline Other (See Comments)    Per pt: unknown  . Escitalopram     Dry mouth  . Levofloxacin     Not indicated due to aneurysm in aorta   . Moxifloxacin     Not indicated due to aneurysm in aorta   . Ofloxacin Other (See Comments)    Per pt: unknown  . Sertraline     insomnia  . Sulfamethoxazole Hives    Any sulfa meds.  . Tape Other (See Comments)    Burns skin.   . Latex Rash    "If pt. Makes contact with or wearing"  . Neomycin Rash     Current Outpatient Medications  Medication Sig Dispense Refill  . acetaminophen (TYLENOL) 500 MG tablet Take 1,000 mg by mouth every 8 (eight) hours as needed. For pain     . Ascorbic Acid (VITAMIN C) 1000 MG tablet Take 1,000 mg by mouth at bedtime.     . Biotin 5000 MCG TABS Take 1 tablet by mouth daily.    . Calcium Carbonate-Vitamin D (CALCIUM + D PO) Take 2 tablets by mouth daily. 1000 units Vitamin D 1200 mg calcium    . Cyanocobalamin (VITAMIN B 12) 500 MCG TABS Take 1,000 mcg by mouth daily.     Marland Kitchen estradiol (ESTRACE) 0.1 MG/GM vaginal cream Place 1 g vaginally 2 (two) times a week.    Marland Kitchen FERREX 150 150 MG capsule TAKE 1 CAPSULE BY MOUTH EVERY DAY 90 capsule 1  . fexofenadine (ALLEGRA) 180 MG tablet Take 180 mg by mouth daily as needed for allergies.     . furosemide (LASIX) 20 MG  tablet Take 1 tablet (20 mg total) by mouth daily as needed for fluid or edema. 30 tablet 6  . metoprolol tartrate (LOPRESSOR) 25 MG tablet Take one tablet by mouth as needed for chest pain.  Maximum 2 tablets in one day. 60 tablet 11  . Polyethyl Glycol-Propyl Glycol (SYSTANE) 0.4-0.3 % SOLN Place 1 drop into both eyes daily.    . RABEprazole (ACIPHEX) 20 MG tablet Take one tablet by mouth once daily for stomach 90 tablet 1  . Rivaroxaban (XARELTO) 15 MG TABS tablet Take 1 tablet (15 mg total) by mouth daily with supper. 90 tablet 3  . SYNTHROID 100 MCG tablet TAKE 1 TABLET BY MOUTH DAILY BEFORE BREAKFAST 90 tablet 1  . trandolapril (MAVIK) 2 MG tablet TAKE 1 TABLET BY MOUTH TWICE A DAY 180 tablet 3  . methylPREDNISolone (MEDROL) 4 MG tablet Medrol dose pack. Take as instructed (Patient not taking: Reported on 11/28/2019) 21 tablet 0   Current Facility-Administered Medications  Medication Dose Route Frequency Provider Last Rate Last Admin  . 0.9 %  sodium chloride infusion  500 mL Intravenous Once Milus Banister, MD  Past Medical History:  Diagnosis Date  . AAA (abdominal aortic aneurysm) (HCC)    2.8 cm 05/2018; 5 year [place] per ACR guidelines  . Allergy    all year   . Anxiety disorder   . Arthritis   . Barrett's esophagus 04/2008  . Benign neoplasm of colon   . Blood transfusion without reported diagnosis    long ago per pt  . Cataract    removed both eyes  . CHF (congestive heart failure) ([place])   . Chronic airway obstruction, not elsewhere classified   . Complication of anesthesia    slow to wake up  . Depressive disorder, not elsewhere classified   . Diverticulitis   . Dysthymic disorder   . Erosive gastritis 08/2004  . GERD (gastroesophageal reflux disease)   . Hair thinning   . HTN (hypertension)    pt denies   . Hyperplastic polyps of stomach 06/2002  . Hypothyroidism   . ICD (implantable cardiac defibrillator) in place    BiV/ICD; s/p removal of ICD with  insertion of BiV PPM 04/26/12  . Incisional hernia   . Insomnia 08/20/2015  . Kyphosis 08/20/2015  . LBBB (left bundle branch block)   . Melanoma ([place])    right arm  . Mitral regurgitation   . Nonischemic cardiomyopathy ([place])    EF initially 20%; Last measurement up to 50% per echo in August of 2012  . Osteopenia   . Other and unspecified hyperlipidemia   . Other and unspecified hyperlipidemia   . Pacemaker 04/26/2012  . PAF (paroxysmal atrial fibrillation) ([place])    11/2016  . Pain in joint, lower leg   . Palpitations   . Pneumonia   . Restless legs syndrome (RLS)   . Synovial cyst of popliteal space   . Unspecified chronic bronchitis ([place])   . Unspecified nasal polyp   . Unspecified sinusitis (chronic)     ROS:   All systems reviewed and negative except as noted in the HPI.   Past Surgical History:  Procedure Laterality Date  . ABDOMINAL HYSTERECTOMY  1076   endometriosis  . APPENDECTOMY    . BI-VENTRICULAR PACEMAKER INSERTION N/A 04/26/2012   Procedure: BI-VENTRICULAR PACEMAKER INSERTION (CRT-P);  Surgeon: Evans Lance, MD;  Location: [facility] CATH LAB;  Service: Cardiovascular;  Laterality: N/A;  . BIV PACEMAKER GENERATOR CHANGEOUT N/A 01/26/2019   Procedure: BIV PACEMAKER GENERATOR CHANGEOUT;  Surgeon: Evans Lance, MD;  Location: [place] CV LAB;  Service: Cardiovascular;  Laterality: N/A;  . COLONOSCOPY    . defibrillator insertion     s/p removal of previously implanted BiV ICD and insertion of a new BiV pacemaker on 04/26/12  . excise mole of lip  2001   Dr. Larena Sox  . excision of wen  1984   Dr. Parks Ranger  . FEMORAL HERNIA REPAIR  12/25/2012   Dr Dalbert Batman  . FEMORAL HERNIA REPAIR  01/04/2013   Recurrent - Dr. Lilyan Punt  . INCISIONAL HERNIA REPAIR N/A 10/01/2013   Procedure: LAPAROSCOPIC INCISIONAL HERNIA;  Surgeon: Gayland Curry, MD;  [facility];  Service: General;  Laterality: N/A;  . INCISIONAL HERNIA REPAIR N/A 09/07/2018   Procedure: LAPAROSCOPIC ASSISTED  REPAIR OF INCISIONAL HERNIA;  Surgeon: Greer Pickerel, MD;  Location: [place];  Service: General;  Laterality: N/A;  . INGUINAL HERNIA REPAIR Right 09/26/2012   Procedure: RIGHT INGUINAL HERNIA REPAIR WITH MESH;  Surgeon: Odis Hollingshead, MD;  Location: [place];  Service: General;  Laterality: Right;  .  INGUINAL HERNIA REPAIR Right 12/25/2012   Procedure: explor right groin, small bowel rescection, tissue repair right femoral hernia;  Surgeon: Adin Hector, MD;  [facility];  Service: General;  Laterality: Right;  . INSERTION OF MESH Right 09/26/2012   Procedure: INSERTION OF MESH;  Surgeon: Odis Hollingshead, MD;  Location: [place];  Service: General;  Laterality: Right;  . INSERTION OF MESH N/A 10/01/2013   Procedure: INSERTION OF MESH;  Surgeon: Gayland Curry, MD;  [facility];  Service: General;  Laterality: N/A;  . LAPAROSCOPY N/A 01/04/2013   Procedure: Diagnostic Laparoscopy, exploratory laparotomy with small bowel resection, closure of right femoral hernia repair;  Surgeon: Madilyn Hook, DO;  Location[facility];  Service: General;  Laterality: N/A;  . Left arm surgery    . POLYPECTOMY    . RIGHT BREAST LUMPECTOMY  1988   Dr. Marylene Buerger  . right knee surgery     arthroscopy: Dr. Shellia Carwin  . TONSILLECTOMY    . UPPER GASTROINTESTINAL ENDOSCOPY       Family History  Problem Relation Age of Onset  . Stroke Father   . Heart disease Other        maternal side  . Colon cancer Paternal Uncle   . Breast cancer Maternal Aunt   . Colon cancer Cousin   . Diabetes Neg Hx   . Colon polyps Neg Hx   . Rectal cancer Neg Hx   . Stomach cancer Neg Hx      Social History   Socioeconomic History  . Marital status: Single    Spouse name: Not on file  . Number of children: 0  . Years of education: Not on file  . Highest education level: Not on file  Occupational History  . Occupation: Retired    Fish farm manager: RETIRED  Tobacco Use  . Smoking status: Former Smoker    Packs/day: 1.00      Years: 15.00    Pack years: 15.00    Types: Cigarettes    Quit date: 12/22/1993    Years since quitting: 25.9  . Smokeless tobacco: Never Used  . Tobacco comment: Does'nt reall year quit   Vaping Use  . Vaping Use: Never used  Substance and Sexual Activity  . Alcohol use: No    Alcohol/week: 0.0 standard drinks  . Drug use: No  . Sexual activity: Never  Other Topics Concern  . Not on file  Social History Narrative   Pt lives alone in a condo   Social Determinants of Health   Financial Resource Strain:   . Difficulty of Paying Living Expenses: Not on file  Food Insecurity:   . Worried About Charity fundraiser in the Last Year: Not on file  . Ran Out of Food in the Last Year: Not on file  Transportation Needs:   . Lack of Transportation (Medical): Not on file  . Lack of Transportation (Non-Medical): Not on file  Physical Activity:   . Days of Exercise per Week: Not on file  . Minutes of Exercise per Session: Not on file  Stress:   . Feeling of Stress : Not on file  Social Connections:   . Frequency of Communication with Friends and Family: Not on file  . Frequency of Social Gatherings with Friends and Family: Not on file  . Attends Religious Services: Not on file  . Active Member of Clubs or Organizations: Not on file  . Attends Archivist Meetings: Not on file  .  Marital Status: Not on file  Intimate Partner Violence:   . Fear of Current or Ex-Partner: Not on file  . Emotionally Abused: Not on file  . Physically Abused: Not on file  . Sexually Abused: Not on file     BP 122/72   Pulse 60   Ht 5\' 8"  (1.727 m)   Wt 125 lb 12.8 oz (57.1 kg)   SpO2 98%   BMI 19.13 kg/m   Physical Exam:  Frail, but otherwise well appearing elderly woman, NAD HEENT: Unremarkable Neck: 6 cm JVD, no thyromegally Lymphatics:  No adenopathy Back:  No CVA tenderness Lungs:  Clear, with no wheezes, rales, or rhonchi HEART:  Regular rate rhythm, no murmurs, no rubs,  no clicks Abd:  soft, positive bowel sounds, no organomegally, no rebound, no guarding Ext:  2 plus pulses, no edema, no cyanosis, no clubbing Skin:  No rashes no nodules Neuro:  CN II through XII intact, motor grossly intact  EKG -normal sinus rhythm with biventricular pacing  DEVICE  Normal device function.  See PaceArt for details.   Assess/Plan: 1.  Chronic systolic heart failure -her EF is normalized.  Her symptoms are class II.  She will continue her current medical therapy. 2.  Biventricular pacemaker -interrogation of her device today demonstrates normal function.  We will recheck in several months. 3.  Relative malnutrition -her BMI is low.  I have encouraged the patient to increase her caloric intake. 4.  Paroxysmal atrial fibrillation -she is asymptomatic.  She will maintain chronic systemic anticoagulation.  Cristopher Peru, MD

## 2019-12-03 LAB — CUP PACEART INCLINIC DEVICE CHECK
Brady Statistic RA Percent Paced: 41 %
Brady Statistic RV Percent Paced: 98 %
Date Time Interrogation Session: 20210922165428
Implantable Lead Implant Date: 20070309
Implantable Lead Implant Date: 20070309
Implantable Lead Implant Date: 20070309
Implantable Lead Location: 753858
Implantable Lead Location: 753859
Implantable Lead Location: 753860
Implantable Lead Model: 158
Implantable Lead Model: 4087
Implantable Lead Model: 4543
Implantable Lead Serial Number: 121652
Implantable Lead Serial Number: 168416
Implantable Lead Serial Number: 268502
Implantable Pulse Generator Implant Date: 20201120
Lead Channel Impedance Value: 396 Ohm
Lead Channel Impedance Value: 489 Ohm
Lead Channel Impedance Value: 492 Ohm
Lead Channel Pacing Threshold Amplitude: 0.8 V
Lead Channel Pacing Threshold Amplitude: 1.6 V
Lead Channel Pacing Threshold Amplitude: 3.3 V
Lead Channel Pacing Threshold Pulse Width: 0.4 ms
Lead Channel Pacing Threshold Pulse Width: 1 ms
Lead Channel Pacing Threshold Pulse Width: 1 ms
Lead Channel Sensing Intrinsic Amplitude: 1.7 mV
Lead Channel Sensing Intrinsic Amplitude: 13.8 mV
Lead Channel Sensing Intrinsic Amplitude: 5.8 mV
Lead Channel Setting Pacing Amplitude: 2 V
Lead Channel Setting Pacing Amplitude: 2.6 V
Lead Channel Setting Pacing Amplitude: 4 V
Lead Channel Setting Pacing Pulse Width: 1 ms
Lead Channel Setting Pacing Pulse Width: 1 ms
Lead Channel Setting Sensing Sensitivity: 2.5 mV
Lead Channel Setting Sensing Sensitivity: 2.5 mV
Pulse Gen Serial Number: 714246

## 2019-12-13 NOTE — Progress Notes (Signed)
[name] Date of Birth: 07-03-1932 Medical Record #[id number]  History of Present Illness: Ms. Curet is seen today for follow up. She has multiple medical issues which include a cardiomyopathy, underlying BiV/ICD which resulted in improvement of her EF, LBBB, HTN, thyroid disease, melanoma, anxiety/depression and chronic systolic HF.   In September 2018 she was noted in device clinic to have paroxysmal Afib with controlled rate. She was started on Eliquis. [place] Vasc score of at least 4. She developed itching on Eliquis and switched to Xarelto. Itching did not go away so she was switched to Coumadin. After about a month the itching resolved.  She was later switched back to Xarelto without rash. She had change out of her ICD generator in November due to ERI. Her device was downgraded to Biventricular pacing only. Follow up with Dr Lovena Le  In September showed normal device function.  On follow up today she has a lot of complaints. Feels fatigued with no energy. Back hurts a lot. Leg pain. She has seen Dr Ninfa Linden.  She is depressed. Worried that she can't gain weight and her clothes don't fit. No dyspnea, edema, palpitations, chest pain.  Current Outpatient Medications  Medication Sig Dispense Refill   acetaminophen (TYLENOL) 500 MG tablet Take 1,000 mg by mouth every 8 (eight) hours as needed. For pain      Ascorbic Acid (VITAMIN C) 1000 MG tablet Take 1,000 mg by mouth at bedtime.      Biotin 5000 MCG TABS Take 1 tablet by mouth daily.     Calcium Carbonate-Vitamin D (CALCIUM + D PO) Take 2 tablets by mouth daily. 1000 units Vitamin D 1200 mg calcium     Cyanocobalamin (VITAMIN B 12) 500 MCG TABS Take 1,000 mcg by mouth daily.      estradiol (ESTRACE) 0.1 MG/GM vaginal cream Place 1 g vaginally 2 (two) times a week.     FERREX 150 150 MG capsule TAKE 1 CAPSULE BY MOUTH EVERY DAY 90 capsule 1   fexofenadine (ALLEGRA) 180 MG tablet Take 180 mg by mouth daily as needed for  allergies.      furosemide (LASIX) 20 MG tablet Take 1 tablet (20 mg total) by mouth daily as needed for fluid or edema. 30 tablet 6   methylPREDNISolone (MEDROL) 4 MG tablet Medrol dose pack. Take as instructed 21 tablet 0   metoprolol tartrate (LOPRESSOR) 25 MG tablet Take one tablet by mouth as needed for chest pain.  Maximum 2 tablets in one day. 60 tablet 11   Polyethyl Glycol-Propyl Glycol (SYSTANE) 0.4-0.3 % SOLN Place 1 drop into both eyes daily.     RABEprazole (ACIPHEX) 20 MG tablet Take one tablet by mouth once daily for stomach 90 tablet 1   Rivaroxaban (XARELTO) 15 MG TABS tablet Take 1 tablet (15 mg total) by mouth daily with supper. 90 tablet 3   SYNTHROID 100 MCG tablet TAKE 1 TABLET BY MOUTH DAILY BEFORE BREAKFAST 90 tablet 1   trandolapril (MAVIK) 2 MG tablet TAKE 1 TABLET BY MOUTH TWICE A DAY 180 tablet 3   Current Facility-Administered Medications  Medication Dose Route Frequency Provider Last Rate Last Admin   0.9 %  sodium chloride infusion  500 mL Intravenous Once Milus Banister, MD        Allergies  Allergen Reactions   Hydrocodone Itching   Cephalexin Itching and Swelling   Ciprofloxacin     Not indicated due to aneurysm in aorta     Doxycycline Other (  See Comments)    Per pt: unknown   Escitalopram     Dry mouth   Levofloxacin     Not indicated due to aneurysm in aorta    Moxifloxacin     Not indicated due to aneurysm in aorta    Ofloxacin Other (See Comments)    Per pt: unknown   Sertraline     insomnia   Sulfamethoxazole Hives    Any sulfa meds.   Tape Other (See Comments)    Burns skin.    Latex Rash    "If pt. Makes contact with or wearing"   Neomycin Rash    Past Medical History:  Diagnosis Date   AAA (abdominal aortic aneurysm) (HCC)    2.8 cm 05/2018; 5 year [place] per ACR guidelines   Allergy    all year    Anxiety disorder    Arthritis    Barrett's esophagus 04/2008   Benign neoplasm of colon    Blood  transfusion without reported diagnosis    long ago per pt   Cataract    removed both eyes   CHF (congestive heart failure) ([place])    Chronic airway obstruction, not elsewhere classified    Complication of anesthesia    slow to wake up   Depressive disorder, not elsewhere classified    Diverticulitis    Dysthymic disorder    Erosive gastritis 08/2004   GERD (gastroesophageal reflux disease)    Hair thinning    HTN (hypertension)    pt denies    Hyperplastic polyps of stomach 06/2002   Hypothyroidism    ICD (implantable cardiac defibrillator) in place    BiV/ICD; s/p removal of ICD with insertion of BiV PPM 04/26/12   Incisional hernia    Insomnia 08/20/2015   Kyphosis 08/20/2015   LBBB (left bundle branch block)    Melanoma (HCC)    right arm   Mitral regurgitation    Nonischemic cardiomyopathy ([place])    EF initially 20%; Last measurement up to 50% per echo in August of 2012   Osteopenia    Other and unspecified hyperlipidemia    Other and unspecified hyperlipidemia    Pacemaker 04/26/2012   PAF (paroxysmal atrial fibrillation) ([place])    11/2016   Pain in joint, lower leg    Palpitations    Pneumonia    Restless legs syndrome (RLS)    Synovial cyst of popliteal space    Unspecified chronic bronchitis (HCC)    Unspecified nasal polyp    Unspecified sinusitis (chronic)     Past Surgical History:  Procedure Laterality Date   ABDOMINAL HYSTERECTOMY  1076   endometriosis   APPENDECTOMY     BI-VENTRICULAR PACEMAKER INSERTION N/A 04/26/2012   Procedure: BI-VENTRICULAR PACEMAKER INSERTION (CRT-P);  Surgeon: Evans Lance, MD;  Location: [facility] CATH LAB;  Service: Cardiovascular;  Laterality: N/A;   BIV PACEMAKER GENERATOR CHANGEOUT N/A 01/26/2019   Procedure: BIV PACEMAKER GENERATOR CHANGEOUT;  Surgeon: Evans Lance, MD;  Location: [place] CV LAB;  Service: Cardiovascular;  Laterality: N/A;   COLONOSCOPY     defibrillator insertion      s/p removal of previously implanted BiV ICD and insertion of a new BiV pacemaker on 04/26/12   excise mole of lip  2001   Dr. Larena Sox   excision of wen  1984   Dr. Parks Ranger   FEMORAL HERNIA REPAIR  12/25/2012   Dr Dalbert Batman   FEMORAL HERNIA REPAIR  01/04/2013   Recurrent - Dr. Lilyan Punt  INCISIONAL HERNIA REPAIR N/A 10/01/2013   Procedure: LAPAROSCOPIC INCISIONAL HERNIA;  Surgeon: Gayland Curry, MD;  [facility];  Service: General;  Laterality: N/A;   INCISIONAL HERNIA REPAIR N/A 09/07/2018   Procedure: LAPAROSCOPIC ASSISTED REPAIR OF INCISIONAL HERNIA;  Surgeon: Greer Pickerel, MD;  Location: [place];  Service: General;  Laterality: N/A;   INGUINAL HERNIA REPAIR Right 09/26/2012   Procedure: RIGHT INGUINAL HERNIA REPAIR WITH MESH;  Surgeon: Odis Hollingshead, MD;  Location: [place];  Service: General;  Laterality: Right;   INGUINAL HERNIA REPAIR Right 12/25/2012   Procedure: explor right groin, small bowel rescection, tissue repair right femoral hernia;  Surgeon: Adin Hector, MD;  [facility];  Service: General;  Laterality: Right;   INSERTION OF MESH Right 09/26/2012   Procedure: INSERTION OF MESH;  Surgeon: Odis Hollingshead, MD;  Location: [place];  Service: General;  Laterality: Right;   INSERTION OF MESH N/A 10/01/2013   Procedure: INSERTION OF MESH;  Surgeon: Gayland Curry, MD;  [facility];  Service: General;  Laterality: N/A;   LAPAROSCOPY N/A 01/04/2013   Procedure: Diagnostic Laparoscopy, exploratory laparotomy with small bowel resection, closure of right femoral hernia repair;  Surgeon: Madilyn Hook, DO;  Location[facility];  Service: General;  Laterality: N/A;   Left arm surgery     POLYPECTOMY     RIGHT BREAST LUMPECTOMY  1988   Dr. Marylene Buerger   right knee surgery     arthroscopy: Dr. Shellia Carwin   TONSILLECTOMY     UPPER GASTROINTESTINAL ENDOSCOPY      Social History   Tobacco Use  Smoking Status Former Smoker   Packs/day: 1.00   Years: 15.00    Pack years: 15.00   Types: Cigarettes   Quit date: 12/22/1993   Years since quitting: 26.0  Smokeless Tobacco Never Used  Tobacco Comment   Does'nt reall year quit     Social History   Substance and Sexual Activity  Alcohol Use No   Alcohol/week: 0.0 standard drinks    Family History  Problem Relation Age of Onset   Stroke Father    Heart disease Other        maternal side   Colon cancer Paternal Uncle    Breast cancer Maternal Aunt    Colon cancer Cousin    Diabetes Neg Hx    Colon polyps Neg Hx    Rectal cancer Neg Hx    Stomach cancer Neg Hx     Review of Systems: The review of systems is per the HPI.   All other systems were reviewed and are negative.  Physical Exam: BP 126/68    Pulse (!) 55    Ht 5' 8.5" (1.74 m)    Wt 125 lb 12.8 oz (57.1 kg)    SpO2 99%    BMI 18.85 kg/m  GENERAL:  Well appearing, elderly thin WF in NAD HEENT:  PERRL, EOMI, sclera are clear. Oropharynx is clear. NECK:  No jugular venous distention, carotid upstroke brisk and symmetric, no bruits, no thyromegaly or adenopathy LUNGS:  Clear to auscultation bilaterally CHEST:  Unremarkable HEART:  RRR,  PMI not displaced or sustained,S1 and S2 within normal limits, no S3, no S4: no clicks, no rubs, no murmurs ABD:  Soft, nontender. BS +, no masses or bruits. No hepatomegaly, no splenomegaly EXT:  2 + pulses throughout, no edema, no cyanosis no clubbing SKIN:  Warm and dry.  No rashes NEURO:  Alert and oriented x  3. Cranial nerves II through XII intact. PSYCH:  Cognitively intact      Wt Readings from Last 3 Encounters:  12/17/19 125 lb 12.8 oz (57.1 kg)  11/28/19 125 lb 12.8 oz (57.1 kg)  11/22/19 125 lb 8 oz (56.9 kg)     LABORATORY DATA: Lab Results  Component Value Date   WBC 5.9 11/22/2019   HGB 11.4 (L) 11/22/2019   HCT 34.2 (L) 11/22/2019   PLT 167 11/22/2019   GLUCOSE 81 11/22/2019   CHOL 159 11/22/2019   TRIG 137 11/22/2019   HDL 53 11/22/2019   LDLCALC  82 11/22/2019   ALT 11 11/22/2019   AST 19 11/22/2019   NA 136 11/22/2019   K 4.9 11/22/2019   CL 104 11/22/2019   CREATININE 0.93 (H) 11/22/2019   BUN 44 (H) 11/22/2019   CO2 25 11/22/2019   TSH 0.47 11/22/2019   INR 1.0 09/07/2018    Echo: 07/22/16: Study Conclusions  - Left ventricle: The cavity size was mildly dilated. Wall   thickness was normal. Systolic function was normal. The estimated   ejection fraction was in the range of 50% to 55%. Wall motion was   normal; there were no regional wall motion abnormalities. - Aortic valve: There was trivial regurgitation. - Mitral valve: There was mild regurgitation. - Atrial septum: No defect or patent foramen ovale was identified.  Echo 06/29/19: IMPRESSIONS    1. Left ventricular ejection fraction, by estimation, is 55 to 60%. The  left ventricle has normal function. The left ventricle has no regional  wall motion abnormalities. Left ventricular diastolic parameters are  consistent with Grade II diastolic  dysfunction (pseudonormalization). Elevated left atrial pressure.  2. Right ventricular systolic function is normal. The right ventricular  size is normal. There is moderately elevated pulmonary artery systolic  pressure.  3. Left atrial size was moderately dilated.  4. The mitral valve is normal in structure. Mild to moderate mitral valve  regurgitation.  5. Tricuspid valve regurgitation is mild to moderate.  6. The aortic valve is tricuspid. Aortic valve regurgitation is mild. No  aortic stenosis is present.  7. Aortic dilatation noted. There is borderline dilatation of the  ascending aorta measuring 37 mm.  8. The inferior vena cava is dilated in size with >50% respiratory  variability, suggesting right atrial pressure of 8 mmHg.   Assessment / Plan: 1.  History of dialted Cardiomyopathy with chronic systolic CHF.  EF last normal in April 2021. Has BiV pacemaker in place.    She is euvolemic.   2. NSVT.  Follow up ICD/biV in device clinic. S/p generator change out in November 2020. Normal device function in September.  3. Paroxysmal Afib. [place] Vasc score of at least 4. On Xarelto. Tolerating well. Afib burden < 1%.   4. Small AAA and left iliac aneurysm. Follow up with [facility]. Last doppler showed this was 2.9 cm.       I will follow up in 6 months.

## 2019-12-17 ENCOUNTER — Encounter: Payer: Self-pay | Admitting: Cardiology

## 2019-12-17 ENCOUNTER — Ambulatory Visit (INDEPENDENT_AMBULATORY_CARE_PROVIDER_SITE_OTHER): Payer: Medicare Other | Admitting: Cardiology

## 2019-12-17 VITALS — BP 126/68 | HR 55 | Ht 68.5 in | Wt 125.8 lb

## 2019-12-17 DIAGNOSIS — I480 Paroxysmal atrial fibrillation: Secondary | ICD-10-CM | POA: Diagnosis not present

## 2019-12-17 DIAGNOSIS — Z950 Presence of cardiac pacemaker: Secondary | ICD-10-CM

## 2019-12-17 DIAGNOSIS — I5022 Chronic systolic (congestive) heart failure: Secondary | ICD-10-CM | POA: Diagnosis not present

## 2019-12-17 DIAGNOSIS — I2584 Coronary atherosclerosis due to calcified coronary lesion: Secondary | ICD-10-CM

## 2019-12-17 DIAGNOSIS — I447 Left bundle-branch block, unspecified: Secondary | ICD-10-CM | POA: Diagnosis not present

## 2019-12-17 DIAGNOSIS — I2510 Atherosclerotic heart disease of native coronary artery without angina pectoris: Secondary | ICD-10-CM

## 2019-12-31 ENCOUNTER — Other Ambulatory Visit: Payer: Self-pay | Admitting: Internal Medicine

## 2019-12-31 DIAGNOSIS — N390 Urinary tract infection, site not specified: Secondary | ICD-10-CM | POA: Diagnosis not present

## 2019-12-31 DIAGNOSIS — R309 Painful micturition, unspecified: Secondary | ICD-10-CM | POA: Diagnosis not present

## 2019-12-31 NOTE — Telephone Encounter (Signed)
rx sent to pharmacy by e-script  

## 2020-01-03 DIAGNOSIS — H04123 Dry eye syndrome of bilateral lacrimal glands: Secondary | ICD-10-CM | POA: Diagnosis not present

## 2020-01-03 DIAGNOSIS — H26491 Other secondary cataract, right eye: Secondary | ICD-10-CM | POA: Diagnosis not present

## 2020-01-04 ENCOUNTER — Telehealth: Payer: Self-pay

## 2020-01-04 NOTE — Telephone Encounter (Signed)
Patient called and asked what are the symptoms of shingles. She stated she been in pain on her back for over a month and. I explained to her the symptoms and she had none of those, but was concerned because a family member

## 2020-01-14 ENCOUNTER — Telehealth: Payer: Self-pay

## 2020-01-14 NOTE — Telephone Encounter (Signed)
Pt wanted to know when her next device clinic appointment. I let her know the March schedule is not up to schedule appointments yet. She should receive a letter in the mail to make that appointment.

## 2020-01-17 ENCOUNTER — Other Ambulatory Visit: Payer: Self-pay | Admitting: Physician Assistant

## 2020-01-22 DIAGNOSIS — Z23 Encounter for immunization: Secondary | ICD-10-CM | POA: Diagnosis not present

## 2020-01-28 ENCOUNTER — Telehealth: Payer: Self-pay | Admitting: Cardiology

## 2020-01-28 NOTE — Telephone Encounter (Signed)
Returned call to patient phone rings busy. 

## 2020-01-28 NOTE — Telephone Encounter (Signed)
New Message:     Please call, concerning her blood thinner.

## 2020-01-29 MED ORDER — RIVAROXABAN 15 MG PO TABS: ORAL_TABLET | ORAL | 3 refills | Status: DC

## 2020-01-29 NOTE — Telephone Encounter (Signed)
Spoke to patient she stated she never received Xarelto prescription.Advised I will send refill to your mail order pharmacy.

## 2020-01-30 ENCOUNTER — Ambulatory Visit: Payer: Medicare Other | Admitting: Orthopaedic Surgery

## 2020-02-13 ENCOUNTER — Encounter: Payer: Self-pay | Admitting: Orthopaedic Surgery

## 2020-02-13 ENCOUNTER — Ambulatory Visit (INDEPENDENT_AMBULATORY_CARE_PROVIDER_SITE_OTHER): Payer: Medicare Other | Admitting: Orthopaedic Surgery

## 2020-02-13 DIAGNOSIS — I2510 Atherosclerotic heart disease of native coronary artery without angina pectoris: Secondary | ICD-10-CM | POA: Diagnosis not present

## 2020-02-13 DIAGNOSIS — M25561 Pain in right knee: Secondary | ICD-10-CM

## 2020-02-13 DIAGNOSIS — I2584 Coronary atherosclerosis due to calcified coronary lesion: Secondary | ICD-10-CM | POA: Diagnosis not present

## 2020-02-13 DIAGNOSIS — G8929 Other chronic pain: Secondary | ICD-10-CM | POA: Diagnosis not present

## 2020-02-13 DIAGNOSIS — M5440 Lumbago with sciatica, unspecified side: Secondary | ICD-10-CM | POA: Diagnosis not present

## 2020-02-13 MED ORDER — METHYLPREDNISOLONE ACETATE 40 MG/ML IJ SUSP
40.0000 mg | INTRAMUSCULAR | Status: AC | PRN
  Administered 2020-02-13: 40 mg via INTRA_ARTICULAR

## 2020-02-13 MED ORDER — LIDOCAINE HCL 1 % IJ SOLN
3.0000 mL | INTRAMUSCULAR | Status: AC | PRN
  Administered 2020-02-13: 3 mL

## 2020-02-13 NOTE — Progress Notes (Signed)
Office Visit Note   Patient: [name]           Date of Birth: 1933/02/06           MRN: [id number] Visit Date: 02/13/2020              Requested by: Gayland Curry, DO [place],  [place] [id number] PCP: Gayland Curry, DO   Assessment & Plan: Visit Diagnoses:  1. Chronic pain of right knee   2. Chronic bilateral low back pain with sciatica, sciatica laterality unspecified     Plan: Since a steroid injection is helped in the past for her right knee, I did provide a new steroid injection in it today.  At this point we need to obtain a CT myelogram of her lumbar spine given the pain she is having in her back to see whether or not she would benefit from an intervention such as an injection in her spine.  I again expressed the importance of her mental wellbeing and mental health and feel that she should start an antidepressant as directed by her primary care physician as well.  Follow-Up Instructions: Return in about 3 weeks (around 03/05/2020).   Orders:  No orders of the defined types were placed in this encounter.  No orders of the defined types were placed in this encounter.     Procedures: Large Joint Inj: R knee on 02/13/2020 4:00 PM Indications: diagnostic evaluation and pain Details: 22 G 1.5 in needle, superolateral approach  Arthrogram: No  Medications: 3 mL lidocaine 1 %; 40 mg methylPREDNISolone acetate 40 MG/ML Outcome: tolerated well, no immediate complications Procedure, treatment alternatives, risks and benefits explained, specific risks discussed. Consent was given by the patient. Immediately prior to procedure a time out was called to verify the correct patient, procedure, equipment, support staff and site/side marked as required. Patient was prepped and draped in the usual sterile fashion.       Clinical Data: No additional findings.   Subjective: Chief Complaint  Patient presents with  . Lower Back - Pain  . Right Leg - Pain  The  patient is an 86 year old female seen before.  She has debilitating right-sided low back pain and sciatica as well as right knee pain.  She has a posture with her neck that is almost ankylosed type of position with her neck.  She says she is embarrassed to be out in public now because of her pain and posture.  She is very tearful in the office today.  I talked to her in length about whether or not her primary care physician to consider an antidepressant.  She states that she has a lot of side effects from those but they have recommended recently the generic for Wellbutrin.  I encouraged her to try this.  I did provide a steroid injection in her right knee in August of this year.  She said that did help for a while.  She is on chronic blood thinning medications as well.  She cannot have an MRI due to implantable device in her heart.  HPI  Review of Systems She currently denies any headache, chest pain, shortness of breath, fever, chills, nausea, vomiting  Objective: Vital Signs: There were no vitals taken for this visit.  Physical Exam She is alert and oriented and in no acute distress but is tearful. Ortho Exam Examination of her right knee shows significant arthritic changes on my exam with patellofemoral crepitation and lacking full  flexion and extension. Specialty Comments:  No specialty comments available.  Imaging: No results found.   PMFS History: Patient Active Problem List   Diagnosis Date Noted  . Incisional hernia 09/07/2018  . Coronary atherosclerosis due to calcified coronary lesion of native artery 12/09/2017  . Paroxysmal atrial fibrillation ([place]) 11/07/2015  . Lumbago 08/20/2015  . Kyphosis 08/20/2015  . Insomnia 08/20/2015  . Aortic atherosclerosis ([place]) 08/20/2015  . Abdominal bruit 08/20/2015  . Aortic ectasia ([place]) 08/20/2015  . AAA (abdominal aortic aneurysm) without rupture ([place]) 08/20/2015  . B12 deficiency 05/21/2015  . HTN (hypertension) 11/19/2014  .  Cardiomyopathy, dilated, nonischemic ([facility]) 07/18/2014  . Biventricular cardiac pacemaker in situ 04/24/2014  . Chronic cough 04/17/2014  . Aneurysm of iliac artery ([facility]) 04/17/2014  . Incisional hernia, without obstruction or gangrene 11/21/2013  . Frequency of urination 11/21/2013  . Malaise and fatigue 05/08/2013  . Raynaud disease 05/08/2013  . Moderate malnutrition ([place]) 12/24/2012  . Hypothyroidism   . Hair thinning   . Anxiety disorder   . Hyperlipidemia   . Sinusitis, chronic   . Chronic bronchitis ([place])   . Depression   . Restless legs syndrome (RLS)   . PVC (premature ventricular contraction) 06/08/2011  . GERD 02/03/2009  . Chronic systolic heart failure ([place]) 12/12/2008  . BARRETT'S ESOPHAGUS 05/29/2008  . Gastritis and gastroduodenitis 05/29/2008   Past Medical History:  Diagnosis Date  . AAA (abdominal aortic aneurysm) ([facility])    2.8 cm 05/2018; 5 year [place] per ACR guidelines  . Allergy    all year   . Anxiety disorder   . Arthritis   . Barrett's esophagus 04/2008  . Benign neoplasm of colon   . Blood transfusion without reported diagnosis    long ago per pt  . Cataract    removed both eyes  . CHF (congestive heart failure) ([place])   . Chronic airway obstruction, not elsewhere classified   . Complication of anesthesia    slow to wake up  . Depressive disorder, not elsewhere classified   . Diverticulitis   . Dysthymic disorder   . Erosive gastritis 08/2004  . GERD (gastroesophageal reflux disease)   . Hair thinning   . HTN (hypertension)    pt denies   . Hyperplastic polyps of stomach 06/2002  . Hypothyroidism   . ICD (implantable cardiac defibrillator) in place    BiV/ICD; s/p removal of ICD with insertion of BiV PPM 04/26/12  . Incisional hernia   . Insomnia 08/20/2015  . Kyphosis 08/20/2015  . LBBB (left bundle branch block)   . Melanoma ([place])    right arm  . Mitral regurgitation   . Nonischemic cardiomyopathy ([place])    EF initially 20%; Last measurement  up to 50% per echo in August of 2012  . Osteopenia   . Other and unspecified hyperlipidemia   . Other and unspecified hyperlipidemia   . Pacemaker 04/26/2012  . PAF (paroxysmal atrial fibrillation) ([place])    11/2016  . Pain in joint, lower leg   . Palpitations   . Pneumonia   . Restless legs syndrome (RLS)   . Synovial cyst of popliteal space   . Unspecified chronic bronchitis ([place])   . Unspecified nasal polyp   . Unspecified sinusitis (chronic)     Family History  Problem Relation Age of Onset  . Stroke Father   . Heart disease Other        maternal side  . Colon cancer Paternal Uncle   . Breast  cancer Maternal Aunt   . Colon cancer Cousin   . Diabetes Neg Hx   . Colon polyps Neg Hx   . Rectal cancer Neg Hx   . Stomach cancer Neg Hx     Past Surgical History:  Procedure Laterality Date  . ABDOMINAL HYSTERECTOMY  1076   endometriosis  . APPENDECTOMY    . BI-VENTRICULAR PACEMAKER INSERTION N/A 04/26/2012   Procedure: BI-VENTRICULAR PACEMAKER INSERTION (CRT-P);  Surgeon: Evans Lance, MD;  Location[facility];  Service: Cardiovascular;  Laterality: N/A;  . BIV PACEMAKER GENERATOR CHANGEOUT N/A 01/26/2019   Procedure: BIV PACEMAKER GENERATOR CHANGEOUT;  Surgeon: Evans Lance, MD;  Location: [facility] CV LAB;  Service: Cardiovascular;  Laterality: N/A;  . COLONOSCOPY    . defibrillator insertion     s/p removal of previously implanted BiV ICD and insertion of a new BiV pacemaker on 04/26/12  . excise mole of lip  2001   Dr. Larena Sox  . excision of wen  1984   Dr. Parks Ranger  . FEMORAL HERNIA REPAIR  12/25/2012   Dr Dalbert Batman  . FEMORAL HERNIA REPAIR  01/04/2013   Recurrent - Dr. Lilyan Punt  . INCISIONAL HERNIA REPAIR N/A 10/01/2013   Procedure: LAPAROSCOPIC INCISIONAL HERNIA;  Surgeon: Gayland Curry, MD;  Location: [facility];  Service: General;  Laterality: N/A;  . INCISIONAL HERNIA REPAIR N/A 09/07/2018   Procedure: LAPAROSCOPIC ASSISTED REPAIR OF INCISIONAL HERNIA;  Surgeon:  Greer Pickerel, MD;  Location: [facility];  Service: General;  Laterality: N/A;  . INGUINAL HERNIA REPAIR Right 09/26/2012   Procedure: RIGHT INGUINAL HERNIA REPAIR WITH MESH;  Surgeon: Odis Hollingshead, MD;  Location: [facility];  Service: General;  Laterality: Right;  . INGUINAL HERNIA REPAIR Right 12/25/2012   Procedure: explor right groin, small bowel rescection, tissue repair right femoral hernia;  Surgeon: Adin Hector, MD;  Location: [facility];  Service: General;  Laterality: Right;  . INSERTION OF MESH Right 09/26/2012   Procedure: INSERTION OF MESH;  Surgeon: Odis Hollingshead, MD;  Location: [facility];  Service: General;  Laterality: Right;  . INSERTION OF MESH N/A 10/01/2013   Procedure: INSERTION OF MESH;  Surgeon: Gayland Curry, MD;  Location: [facility];  Service: General;  Laterality: N/A;  . LAPAROSCOPY N/A 01/04/2013   Procedure: Diagnostic Laparoscopy, exploratory laparotomy with small bowel resection, closure of right femoral hernia repair;  Surgeon: Madilyn Hook, DO;  Location: [facility];  Service: General;  Laterality: N/A;  . Left arm surgery    . POLYPECTOMY    . RIGHT BREAST LUMPECTOMY  1988   Dr. Marylene Buerger  . right knee surgery     arthroscopy: Dr. Shellia Carwin  . TONSILLECTOMY    . UPPER GASTROINTESTINAL ENDOSCOPY     Social History   Occupational History  . Occupation: Retired    Fish farm manager: RETIRED  Tobacco Use  . Smoking status: Former Smoker    Packs/day: 1.00    Years: 15.00    Pack years: 15.00    Types: Cigarettes    Quit date: 12/22/1993    Years since quitting: 26.1  . Smokeless tobacco: Never Used  . Tobacco comment: Does'nt reall year quit   Vaping Use  . Vaping Use: Never used  Substance and Sexual Activity  . Alcohol use: No    Alcohol/week: 0.0 standard drinks  . Drug use: No  . Sexual activity: Never

## 2020-02-13 NOTE — Addendum Note (Signed)
Addended by: Robyne Peers on: 02/13/2020 04:51 PM   Modules accepted: Orders

## 2020-02-14 ENCOUNTER — Telehealth: Payer: Self-pay

## 2020-02-14 ENCOUNTER — Telehealth: Payer: Self-pay | Admitting: *Deleted

## 2020-02-14 DIAGNOSIS — F339 Major depressive disorder, recurrent, unspecified: Secondary | ICD-10-CM

## 2020-02-14 NOTE — Telephone Encounter (Signed)
Patient called requesting to try the Wellbutrin 150mg  again. Stated that she is ready to try it and wants it sent to pharmacy. Stated that she only wants # 30. Pended and sent to Dr. Mariea Clonts for approval.  Please Advise.

## 2020-02-15 ENCOUNTER — Telehealth: Payer: Self-pay

## 2020-02-15 NOTE — Telephone Encounter (Signed)
FYI

## 2020-02-15 NOTE — Telephone Encounter (Signed)
Patient called she is scheduled for a scan she stated she isn't ready to do the scan yet. [phone]

## 2020-02-18 MED ORDER — BUPROPION HCL ER (XL) 150 MG PO TB24: 150.0000 mg | ORAL_TABLET | Freq: Every day | ORAL | 3 refills | Status: DC

## 2020-02-18 NOTE — Telephone Encounter (Signed)
Weird b/c I thought I signed that the other day--I know I saw it.  Not sure what happened.  It should be sent now.

## 2020-02-18 NOTE — Telephone Encounter (Signed)
Patient called again today wondering if Dr. Mariea Clonts received message.  Resent message.

## 2020-02-18 NOTE — Telephone Encounter (Signed)
Notified patient.

## 2020-02-27 ENCOUNTER — Telehealth (INDEPENDENT_AMBULATORY_CARE_PROVIDER_SITE_OTHER): Payer: Self-pay

## 2020-03-04 ENCOUNTER — Telehealth: Payer: Self-pay | Admitting: Nurse Practitioner

## 2020-03-04 NOTE — Telephone Encounter (Signed)
Called patient to schedule AWV. Left vm. 

## 2020-03-05 ENCOUNTER — Ambulatory Visit (INDEPENDENT_AMBULATORY_CARE_PROVIDER_SITE_OTHER): Payer: Medicare Other | Admitting: Orthopaedic Surgery

## 2020-03-05 ENCOUNTER — Encounter: Payer: Self-pay | Admitting: Orthopaedic Surgery

## 2020-03-05 DIAGNOSIS — G8929 Other chronic pain: Secondary | ICD-10-CM

## 2020-03-05 DIAGNOSIS — I2584 Coronary atherosclerosis due to calcified coronary lesion: Secondary | ICD-10-CM | POA: Diagnosis not present

## 2020-03-05 DIAGNOSIS — I2510 Atherosclerotic heart disease of native coronary artery without angina pectoris: Secondary | ICD-10-CM

## 2020-03-05 DIAGNOSIS — M5440 Lumbago with sciatica, unspecified side: Secondary | ICD-10-CM | POA: Diagnosis not present

## 2020-03-05 NOTE — Progress Notes (Signed)
The patient comes in for continued follow-up for chronic low back pain and right-sided sciatica.  She is 86 years old.  This pain frustrates her.  She cannot have a MRI.  I have recommended a CT myelogram but she is deferred this.  Today she is wondering could she try chiropractic treatment or even acupuncture.  I talked her about possible outpatient physical therapy as well.  Her signs and symptoms are still the same.  She has low back pain on exam and a positive straight leg raise on the right side.  I recommended that she certainly can try outpatient chiropractic treatment and acupuncture.  We can always set her up for outpatient physical therapy if she would like.  There is nothing really else that I can offer without some type of study to help determine whether or not she would benefit from any type of injection.  All question concerns were answered addressed.  Follow-up as needed with [place].

## 2020-03-12 ENCOUNTER — Other Ambulatory Visit: Payer: Self-pay | Admitting: Internal Medicine

## 2020-03-12 DIAGNOSIS — F339 Major depressive disorder, recurrent, unspecified: Secondary | ICD-10-CM

## 2020-03-12 NOTE — Telephone Encounter (Signed)
Pharmacy requested refill Patient is requesting 90 day supply Pended Rx and sent to Dr. Renato Gails due to HIGH ALERT Warning.

## 2020-04-01 DIAGNOSIS — N390 Urinary tract infection, site not specified: Secondary | ICD-10-CM | POA: Diagnosis not present

## 2020-04-01 DIAGNOSIS — R350 Frequency of micturition: Secondary | ICD-10-CM | POA: Diagnosis not present

## 2020-04-03 ENCOUNTER — Telehealth: Payer: Self-pay | Admitting: Cardiology

## 2020-04-03 NOTE — Telephone Encounter (Signed)
Pt c/o of Chest Pain: STAT if CP now or developed within 24 hours  1. Are you having CP right now? No- Monday and Tuesday night  2. Are you experiencing any other symptoms (ex. SOB, nausea, vomiting, sweating)? Weakness, not at this time  3. How long have you been experiencing CP? Started Monday night  4. Is your CP continuous or coming and going? It stayed  5. Have you taken Nitroglycerin? No, she said she too Metoprolol ?   If she is not there when you call,  Please call back tomorrow morning

## 2020-04-03 NOTE — Telephone Encounter (Signed)
Returned call to patient no answer.Left message to call back. 

## 2020-04-04 NOTE — Telephone Encounter (Signed)
Spoke to patient she wanted Dr.Jordan to know she had 2 episodes of chest pain this past Mon and Tue.Stated pain only lasted for a couple of mins.Stated the first episode she took Metoprolol 25 mg with relief.The second episode she fell asleep.She has not had any more chest pain since.Advised to call back if she has any more chest pain.I will make Dr.Jordan aware.

## 2020-04-23 ENCOUNTER — Telehealth: Payer: Self-pay | Admitting: Cardiology

## 2020-04-23 MED ORDER — FUROSEMIDE 20 MG PO TABS: 20.0000 mg | ORAL_TABLET | Freq: Every day | ORAL | 6 refills | Status: DC | PRN

## 2020-04-23 NOTE — Telephone Encounter (Signed)
New Message:      She says she have some issues that she really need to talk to you about please.

## 2020-04-23 NOTE — Telephone Encounter (Signed)
Spoke to patient she stated she has gained 9 lbs this month.She has sob with exertion.Increased swelling in lower legs.She has only took one Lasix 20 mg.Advised to take 20 mg daily for the next 3 days.Appointment scheduled with Coletta Memos NP 2/22 at 3:15 pm.I will make Dr.Jordan aware.

## 2020-04-28 ENCOUNTER — Encounter: Payer: Self-pay | Admitting: Cardiology

## 2020-04-28 ENCOUNTER — Other Ambulatory Visit: Payer: Self-pay

## 2020-04-28 ENCOUNTER — Telehealth: Payer: Self-pay | Admitting: Emergency Medicine

## 2020-04-28 ENCOUNTER — Ambulatory Visit (INDEPENDENT_AMBULATORY_CARE_PROVIDER_SITE_OTHER): Payer: Medicare Other | Admitting: Cardiology

## 2020-04-28 ENCOUNTER — Encounter: Payer: Self-pay | Admitting: Internal Medicine

## 2020-04-28 VITALS — BP 112/80 | HR 125 | Ht 68.5 in | Wt 137.8 lb

## 2020-04-28 DIAGNOSIS — I2584 Coronary atherosclerosis due to calcified coronary lesion: Secondary | ICD-10-CM

## 2020-04-28 DIAGNOSIS — Z7901 Long term (current) use of anticoagulants: Secondary | ICD-10-CM | POA: Diagnosis not present

## 2020-04-28 DIAGNOSIS — Z950 Presence of cardiac pacemaker: Secondary | ICD-10-CM | POA: Diagnosis not present

## 2020-04-28 DIAGNOSIS — Z79899 Other long term (current) drug therapy: Secondary | ICD-10-CM | POA: Diagnosis not present

## 2020-04-28 DIAGNOSIS — I2510 Atherosclerotic heart disease of native coronary artery without angina pectoris: Secondary | ICD-10-CM | POA: Diagnosis not present

## 2020-04-28 DIAGNOSIS — I5041 Acute combined systolic (congestive) and diastolic (congestive) heart failure: Secondary | ICD-10-CM | POA: Diagnosis not present

## 2020-04-28 DIAGNOSIS — I420 Dilated cardiomyopathy: Secondary | ICD-10-CM | POA: Diagnosis not present

## 2020-04-28 DIAGNOSIS — I480 Paroxysmal atrial fibrillation: Secondary | ICD-10-CM

## 2020-04-28 DIAGNOSIS — I509 Heart failure, unspecified: Secondary | ICD-10-CM | POA: Insufficient documentation

## 2020-04-28 MED ORDER — METOPROLOL TARTRATE 25 MG PO TABS: 25.0000 mg | ORAL_TABLET | Freq: Two times a day (BID) | ORAL | 3 refills | Status: DC

## 2020-04-28 NOTE — Assessment & Plan Note (Signed)
Weight is up 7 lbs, new LE edema and DOE- I suspect this is from AF. I wanted to go ahead and schedule her for cardioversion but the patient is hesitant. She asked if her pacemaker could be checked first to see if this was a brief episode of atrial fibrillation that we caught in the office or that she has been in it for the last month or so. This is not an unreasonable approach. I suggested we increase her diuretic for couple days, have her take her metoprolol twice daily around-the-clock and not as needed. I explained to her I am going to go ahead and set her up for cardioversion as it is easier to cancel one and then add one on.

## 2020-04-28 NOTE — Progress Notes (Signed)
Cardiology Office Note:    Date:  04/28/2020   ID:  [name], DOB [birth year], MRN [id number]  PCP:  Gayland Curry, DO  Cardiologist:  Dr [name] Electrophysiologist:  None   Referring MD: Gayland Curry, DO   CC: increased fatigue, SOB, edema  History of Present Illness:    [name] is a 87 y.o. female with a hx of presumed nonischemic cardiomyopathy. She has had a BiV ICD and was a responder. She was changed out to a BiV pacemaker in 2014, she is followed by Dr. Lovena Le. Other medical history is include PAF, she is on chronic low-dose Xarelto, she was intolerant to Eliquis which caused itching. She has treated hypertension and a chronic left bundle branch block.  She is in the office today with complaints of increasing SOB, fatigue, increasing lower extremity edema, and increasing weight for the past month or so. Her EKG in the office shows atrial fibrillation with a ventricular response of 125. She she has taken Lasix 20 mg but has not noticed much relief. She is taken 1 or 2 of her metoprolol 25 mg tablets. She is not had orthopnea.  Past Medical History:  Diagnosis Date  . AAA (abdominal aortic aneurysm) (HCC)    2.8 cm 05/2018; 5 year [place] per ACR guidelines  . Allergy    all year   . Anxiety disorder   . Arthritis   . Barrett's esophagus 04/2008  . Benign neoplasm of colon   . Blood transfusion without reported diagnosis    long ago per pt  . Cataract    removed both eyes  . CHF (congestive heart failure) ([place])   . Chronic airway obstruction, not elsewhere classified   . Complication of anesthesia    slow to wake up  . Depressive disorder, not elsewhere classified   . Diverticulitis   . Dysthymic disorder   . Erosive gastritis 08/2004  . GERD (gastroesophageal reflux disease)   . Hair thinning   . HTN (hypertension)    pt denies   . Hyperplastic polyps of stomach 06/2002  . Hypothyroidism   . ICD (implantable cardiac defibrillator) in place    BiV/ICD;  s/p removal of ICD with insertion of BiV PPM 04/26/12  . Incisional hernia   . Insomnia 08/20/2015  . Kyphosis 08/20/2015  . LBBB (left bundle branch block)   . Melanoma ([place])    right arm  . Mitral regurgitation   . Nonischemic cardiomyopathy ([place])    EF initially 20%; Last measurement up to 50% per echo in August of 2012  . Osteopenia   . Other and unspecified hyperlipidemia   . Other and unspecified hyperlipidemia   . Pacemaker 04/26/2012  . PAF (paroxysmal atrial fibrillation) ([place])    11/2016  . Pain in joint, lower leg   . Palpitations   . Pneumonia   . Restless legs syndrome (RLS)   . Synovial cyst of popliteal space   . Unspecified chronic bronchitis ([place])   . Unspecified nasal polyp   . Unspecified sinusitis (chronic)     Past Surgical History:  Procedure Laterality Date  . ABDOMINAL HYSTERECTOMY  1076   endometriosis  . APPENDECTOMY    . BI-VENTRICULAR PACEMAKER INSERTION N/A 04/26/2012   Procedure: BI-VENTRICULAR PACEMAKER INSERTION (CRT-P);  Surgeon: Evans Lance, MD;  Location: [facility] CATH LAB;  Service: Cardiovascular;  Laterality: N/A;  . BIV PACEMAKER GENERATOR CHANGEOUT N/A 01/26/2019   Procedure: BIV PACEMAKER GENERATOR CHANGEOUT;  Surgeon: Cristopher Peru  W, MD;  Location: [facility] CV LAB;  Service: Cardiovascular;  Laterality: N/A;  . COLONOSCOPY    . defibrillator insertion     s/p removal of previously implanted BiV ICD and insertion of a new BiV pacemaker on 04/26/12  . excise mole of lip  2001   Dr. Larena Sox  . excision of wen  1984   Dr. Parks Ranger  . FEMORAL HERNIA REPAIR  12/25/2012   Dr Dalbert Batman  . FEMORAL HERNIA REPAIR  01/04/2013   Recurrent - Dr. Lilyan Punt  . INCISIONAL HERNIA REPAIR N/A 10/01/2013   Procedure: LAPAROSCOPIC INCISIONAL HERNIA;  Surgeon: Gayland Curry, MD;  Location: [facility];  Service: General;  Laterality: N/A;  . INCISIONAL HERNIA REPAIR N/A 09/07/2018   Procedure: LAPAROSCOPIC ASSISTED REPAIR OF INCISIONAL HERNIA;  Surgeon: Greer Pickerel,  MD;  Location: [facility];  Service: General;  Laterality: N/A;  . INGUINAL HERNIA REPAIR Right 09/26/2012   Procedure: RIGHT INGUINAL HERNIA REPAIR WITH MESH;  Surgeon: Odis Hollingshead, MD;  Location: [facility];  Service: General;  Laterality: Right;  . INGUINAL HERNIA REPAIR Right 12/25/2012   Procedure: explor right groin, small bowel rescection, tissue repair right femoral hernia;  Surgeon: Adin Hector, MD;  Location: [facility];  Service: General;  Laterality: Right;  . INSERTION OF MESH Right 09/26/2012   Procedure: INSERTION OF MESH;  Surgeon: Odis Hollingshead, MD;  Location: [facility];  Service: General;  Laterality: Right;  . INSERTION OF MESH N/A 10/01/2013   Procedure: INSERTION OF MESH;  Surgeon: Gayland Curry, MD;  Location: [facility];  Service: General;  Laterality: N/A;  . LAPAROSCOPY N/A 01/04/2013   Procedure: Diagnostic Laparoscopy, exploratory laparotomy with small bowel resection, closure of right femoral hernia repair;  Surgeon: Madilyn Hook, DO;  Location: [facility];  Service: General;  Laterality: N/A;  . Left arm surgery    . POLYPECTOMY    . RIGHT BREAST LUMPECTOMY  1988   Dr. Marylene Buerger  . right knee surgery     arthroscopy: Dr. Shellia Carwin  . TONSILLECTOMY    . UPPER GASTROINTESTINAL ENDOSCOPY      Current Medications: Current Meds  Medication Sig  . acetaminophen (TYLENOL) 500 MG tablet Take 1,000 mg by mouth every 8 (eight) hours as needed. For pain  . Ascorbic Acid (VITAMIN C) 1000 MG tablet Take 1,000 mg by mouth at bedtime.   . Biotin 5000 MCG TABS Take 1 tablet by mouth daily.  Marland Kitchen buPROPion (WELLBUTRIN XL) 150 MG 24 hr tablet TAKE 1 TABLET BY MOUTH EVERY DAY  . Calcium Carbonate-Vitamin D (CALCIUM + D PO) Take 2 tablets by mouth daily. 1000 units Vitamin D 1200 mg calcium  . Cyanocobalamin (VITAMIN B 12) 500 MCG TABS Take 1,000 mcg by mouth daily.   Marland Kitchen estradiol (ESTRACE) 0.1 MG/GM vaginal cream Place 1 g vaginally 2 (two) times a week.  Marland Kitchen FERREX 150 150 MG capsule TAKE 1  CAPSULE BY MOUTH EVERY DAY  . fexofenadine (ALLEGRA) 180 MG tablet Take 180 mg by mouth daily as needed for allergies.   . furosemide (LASIX) 20 MG tablet Take 1 tablet (20 mg total) by mouth daily as needed for fluid or edema.  . methylPREDNISolone (MEDROL) 4 MG tablet Medrol dose pack. Take as instructed  . Polyethyl Glycol-Propyl Glycol (SYSTANE) 0.4-0.3 % SOLN Place 1 drop into both eyes daily.  . RABEprazole (ACIPHEX) 20 MG tablet Take one tablet by mouth once daily for stomach  . Rivaroxaban (XARELTO) 15 MG  TABS tablet TAKE 1 TABLET DAILY WITH   SUPPER (DOSE REDUCED)  . SYNTHROID 100 MCG tablet TAKE 1 TABLET BY MOUTH DAILY BEFORE BREAKFAST  . trandolapril (MAVIK) 2 MG tablet TAKE 1 TABLET BY MOUTH TWICE A DAY  . [DISCONTINUED] metoprolol tartrate (LOPRESSOR) 25 MG tablet Take one tablet by mouth as needed for chest pain.  Maximum 2 tablets in one day.   Current Facility-Administered Medications for the 04/28/20 encounter (Office Visit) with Erlene Quan, PA-C  Medication  . 0.9 %  sodium chloride infusion     Allergies:   Hydrocodone, Cephalexin, Ciprofloxacin, Doxycycline, Escitalopram, Levofloxacin, Moxifloxacin, Ofloxacin, Sertraline, Sulfamethoxazole, Tape, Latex, and Neomycin   Social History   Socioeconomic History  . Marital status: Single    Spouse name: Not on file  . Number of children: 0  . Years of education: Not on file  . Highest education level: Not on file  Occupational History  . Occupation: Retired    Fish farm manager: RETIRED  Tobacco Use  . Smoking status: Former Smoker    Packs/day: 1.00    Years: 15.00    Pack years: 15.00    Types: Cigarettes    Quit date: 12/22/1993    Years since quitting: 26.3  . Smokeless tobacco: Never Used  . Tobacco comment: Does'nt reall year quit   Vaping Use  . Vaping Use: Never used  Substance and Sexual Activity  . Alcohol use: No    Alcohol/week: 0.0 standard drinks  . Drug use: No  . Sexual activity: Never  Other  Topics Concern  . Not on file  Social History Narrative   Pt lives alone in a condo   Social Determinants of Health   Financial Resource Strain: Not on file  Food Insecurity: Not on file  Transportation Needs: Not on file  Physical Activity: Not on file  Stress: Not on file  Social Connections: Not on file     Family History: The patient's family history includes Breast cancer in her maternal aunt; Colon cancer in her cousin and paternal uncle; Heart disease in an other family member; Stroke in her father. There is no history of Diabetes, Colon polyps, Rectal cancer, or Stomach cancer.  ROS:   Please see the history of present illness.     All other systems reviewed and are negative.  EKGs/Labs/Other Studies Reviewed:    The following studies were reviewed today: Echo 06/29/2019- IMPRESSIONS    1. Left ventricular ejection fraction, by estimation, is 55 to 60%. The  left ventricle has normal function. The left ventricle has no regional  wall motion abnormalities. Left ventricular diastolic parameters are  consistent with Grade II diastolic  dysfunction (pseudonormalization). Elevated left atrial pressure.  2. Right ventricular systolic function is normal. The right ventricular  size is normal. There is moderately elevated pulmonary artery systolic  pressure.  3. Left atrial size was moderately dilated.  4. The mitral valve is normal in structure. Mild to moderate mitral valve  regurgitation.  5. Tricuspid valve regurgitation is mild to moderate.  6. The aortic valve is tricuspid. Aortic valve regurgitation is mild. No  aortic stenosis is present.  7. Aortic dilatation noted. There is borderline dilatation of the  ascending aorta measuring 37 mm.  8. The inferior vena cava is dilated in size with >50% respiratory  variability, suggesting right atrial pressure of 8 mmHg.   EKG:  EKG is ordered today.  The ekg ordered today demonstrates AF with LBBB, VR  125  Recent  Labs: 11/22/2019: ALT 11; BUN 44; Creat 0.93; Hemoglobin 11.4; Magnesium 2.2; Platelets 167; Potassium 4.9; Sodium 136; TSH 0.47  Recent Lipid Panel    Component Value Date/Time   CHOL 159 11/22/2019 1630   CHOL 170 06/19/2019 1630   TRIG 137 11/22/2019 1630   HDL 53 11/22/2019 1630   HDL 65 06/19/2019 1630   CHOLHDL 3.0 11/22/2019 1630   VLDL 15 07/07/2016 1054   LDLCALC 82 11/22/2019 1630    Physical Exam:    VS:  BP 112/80   Pulse (!) 125   Ht 5' 8.5" (1.74 m)   Wt 137 lb 12.8 oz (62.5 kg)   BMI 20.65 kg/m     Wt Readings from Last 3 Encounters:  04/28/20 137 lb 12.8 oz (62.5 kg)  12/17/19 125 lb 12.8 oz (57.1 kg)  11/28/19 125 lb 12.8 oz (57.1 kg)     GEN: Cachectic caucasian female, in no acute distress HEENT: Normal NECK: No JVD CARDIAC: irregularly irregular with increased rate, no murmurs, rubs, gallops RESPIRATORY:  Decreased breath sounds at bases, no rales ABDOMEN: Soft,  non-distended MUSCULOSKELETAL: 1+ LE edema; No deformity  SKIN: Warm and dry NEUROLOGIC:  Alert and oriented x 3 PSYCHIATRIC:  Normal affect   ASSESSMENT:    Acute congestive heart failure (HCC) Weight is up 7 lbs, new LE edema and DOE- I suspect this is from AF. I wanted to go ahead and schedule her for cardioversion but the patient is hesitant. She asked if her pacemaker could be checked first to see if this was a brief episode of atrial fibrillation that we caught in the office or that she has been in it for the last month or so. This is not an unreasonable approach. I suggested we increase her diuretic for couple days, have her take her metoprolol twice daily around-the-clock and not as needed. I explained to her I am going to go ahead and set her up for cardioversion as it is easier to cancel one and then add one on.  Cardiomyopathy, dilated, nonischemic (HCC) EF 55-60% April 2021 echo  Biventricular cardiac pacemaker in situ Dr Lovena Le follows. The patient tells me she  does not do remote checks, she comes in the office to have her pacemaker checked. She is overdue for this, I will see if I can get her in the office this week for a pacer check and we can determine the duration of her atrial fibrillation.  Anticoagulated Intolerant to Eliquis- itching, changed to Xarelto 15 mg. She reports no missed doses  Paroxysmal atrial fibrillation (HCC) Recurrent with RVR today- resume Lopressor 25 mg BID (not PRN)  PLAN:    Metoprolol 25 mg BID, Lasix 40 mg QD x 2 then 20 mg daily, I will arrange for a pacemaker check and check labs today.  I went ahead and scheduled an OP DCCV for March 1st- we can always cancel if she spontaneously converts or declines. I'll ask Dr [name] to review as well.    Medication Adjustments/Labs and Tests Ordered: Current medicines are reviewed at length with the patient today.  Concerns regarding medicines are outlined above.  No orders of the defined types were placed in this encounter.  No orders of the defined types were placed in this encounter.   There are no Patient Instructions on file for this visit.   Angelena Form, PA-C  04/28/2020 3:59 PM    [facility]

## 2020-04-28 NOTE — Assessment & Plan Note (Signed)
Intolerant to Eliquis- itching, changed to Xarelto 15 mg. She reports no missed doses

## 2020-04-28 NOTE — Telephone Encounter (Signed)
Received call [place] PA's office and patient needs in-clinic device clinic appointment. Would like to schedule DCCV and patient does not do remotes and has not attended in-clinic check since 11/28/19. Patient scheduled for 05/01/20 at 1630 .

## 2020-04-28 NOTE — Assessment & Plan Note (Signed)
Dr Lovena Le follows. The patient tells me she does not do remote checks, she comes in the office to have her pacemaker checked. She is overdue for this, I will see if I can get her in the office this week for a pacer check and we can determine the duration of her atrial fibrillation.

## 2020-04-28 NOTE — Patient Instructions (Addendum)
Medication Instructions:  INCREASE- Furosemide(Lasix) 40 mg by mouth for 2 days then back to 20 mg by mouth daily TAKE- Metoprolol Tartrate 25 mg by mouth twice a day  *If you need a refill on your cardiac medications before your next appointment, please call your pharmacy*   Lab Work: CBC, BMP, TSH and Magnesium  If you have labs (blood work) drawn today and your tests are completely normal, you will receive your results only by: Marland Kitchen MyChart Message (if you have MyChart) OR . A paper copy in the mail If you have any lab test that is abnormal or we need to change your treatment, we will call you to review the results.   Testing/Procedures: Your physician has recommended that you have a Cardioversion (DCCV). Electrical Cardioversion uses a jolt of electricity to your heart either through paddles or wired patches attached to your chest. This is a controlled, usually prescheduled, procedure. Defibrillation is done under light anesthesia in the hospital, and you usually go home the day of the procedure. This is done to get your heart back into a normal rhythm. You are not awake for the procedure. Please see the instruction sheet given to you today.  Follow-Up: At [facility], you and your health needs are our priority.  As part of our continuing mission to provide you with exceptional heart care, we have created designated Provider Care Teams.  These Care Teams include your primary Cardiologist (physician) and Advanced Practice Providers (APPs -  Physician Assistants and Nurse Practitioners) who all work together to provide you with the care you need, when you need it.  We recommend signing up for the patient portal called "MyChart".  Sign up information is provided on this After Visit Summary.  MyChart is used to connect with patients for Virtual Visits (Telemedicine).  Patients are able to view lab/test results, encounter notes, upcoming appointments, etc.  Non-urgent messages can be sent to  your provider as well.   To learn more about what you can do with MyChart, go to [email or web address].    Your next appointment:   1 Month(s)  The format for your next appointment:   In Person  Provider:   You may see [name] [name], MD or one of the following Advanced Practice Providers on your designated Care Team:    [name] Deforest, PA-C  [name], [place] or   [name], [place]    Other Instructions Dear [name] are scheduled for a Cardioversion on March 1st with Dr. Debara [name].  Please arrive at the [facility] (Main Entrance A) at [facility]: [address] [place], [place] [address] at 10:30 am. (1 hour prior to procedure unless lab work is needed; if lab work is needed arrive 1.5 hours ahead)  DIET: Nothing to eat or drink after midnight except a sip of water with medications (see medication instructions below)  Medication Instructions: Hold Furosemide day of procedure  Continue your anticoagulant: Xarelto You will need to continue your anticoagulant after your procedure until you  are told by your  Provider that it is safe to stop   Labs: CBC and BMP  Come to: [place] between the hours of 8:00 am and 4:30 pm. You do not have to be fasting  COVID19 TEST: Saturday February 26th @ 11:00 am. This is a Drive Up Visit at [address] [place], [place] [address]   You must have a responsible person to drive you home and stay in the waiting  area during your procedure. Failure to do so could result in cancellation.  Bring your insurance cards.  *Special Note: Every effort is made to have your procedure done on time. Occasionally there are emergencies that occur at the hospital that may cause delays. Please be patient if a delay does occur.

## 2020-04-28 NOTE — H&P (View-Only) (Signed)
Cardiology Office Note:    Date:  04/28/2020   ID:  [name], DOB [birth year], MRN [id number]  PCP:  [name] Curry, DO  Cardiologist:  [name] [name]:  None   Referring MD: [name] Curry, DO   CC: increased fatigue, SOB, edema  History of Present Illness:    [name] is a 87 y.o. female with a hx of presumed nonischemic cardiomyopathy. She has had a BiV ICD and was a responder. She was changed out to a BiV pacemaker in 2014, she is followed by [name]. Lovena [name]. Other medical history is include PAF, she is on chronic low-dose Xarelto, she was intolerant to Eliquis which caused itching. She has treated hypertension and a chronic left bundle branch block.  She is in the office today with complaints of increasing SOB, fatigue, increasing lower extremity edema, and increasing weight for the past month or so. Her EKG in the office shows atrial fibrillation with a ventricular response of 125. She she has taken Lasix 20 mg but has not noticed much relief. She is taken 1 or 2 of her metoprolol 25 mg tablets. She is not had orthopnea.  Past Medical History:  Diagnosis Date  . AAA (abdominal aortic aneurysm) (HCC)    2.8 cm 05/2018; 5 year [place] per ACR guidelines  . Allergy    all year   . Anxiety disorder   . Arthritis   . Barrett's esophagus 04/2008  . Benign neoplasm of colon   . Blood transfusion without reported diagnosis    long ago per pt  . Cataract    removed both eyes  . CHF (congestive heart failure) ([place])   . Chronic airway obstruction, not elsewhere classified   . Complication of anesthesia    slow to wake up  . Depressive disorder, not elsewhere classified   . Diverticulitis   . Dysthymic disorder   . Erosive gastritis 08/2004  . GERD (gastroesophageal reflux disease)   . Hair thinning   . HTN (hypertension)    pt denies   . Hyperplastic polyps of stomach 06/2002  . Hypothyroidism   . ICD (implantable cardiac defibrillator) in place    BiV/ICD;  s/p removal of ICD with insertion of BiV PPM 04/26/12  . Incisional hernia   . Insomnia 08/20/2015  . Kyphosis 08/20/2015  . LBBB (left bundle branch block)   . Melanoma ([place])    right arm  . Mitral regurgitation   . Nonischemic cardiomyopathy ([place])    EF initially 20%; Last measurement up to 50% per echo in August of 2012  . Osteopenia   . Other and unspecified hyperlipidemia   . Other and unspecified hyperlipidemia   . Pacemaker 04/26/2012  . PAF (paroxysmal atrial fibrillation) ([place])    11/2016  . Pain in joint, lower leg   . Palpitations   . Pneumonia   . Restless legs syndrome (RLS)   . Synovial cyst of popliteal space   . Unspecified chronic bronchitis ([place])   . Unspecified nasal polyp   . Unspecified sinusitis (chronic)     Past Surgical History:  Procedure Laterality Date  . ABDOMINAL HYSTERECTOMY  1076   endometriosis  . APPENDECTOMY    . BI-VENTRICULAR PACEMAKER INSERTION N/A 04/26/2012   Procedure: BI-VENTRICULAR PACEMAKER INSERTION (CRT-P);  Surgeon: Evans Lance, MD;  Location: [facility] CATH LAB;  Service: Cardiovascular;  Laterality: N/A;  . BIV PACEMAKER GENERATOR CHANGEOUT N/A 01/26/2019   Procedure: BIV PACEMAKER GENERATOR CHANGEOUT;  Surgeon: Cristopher Peru  W, MD;  Location: [facility] CV LAB;  Service: Cardiovascular;  Laterality: N/A;  . COLONOSCOPY    . defibrillator insertion     s/p removal of previously implanted BiV ICD and insertion of a new BiV pacemaker on 04/26/12  . excise mole of lip  2001   [name]. Larena Sox  . excision of wen  1984   [name]. Parks Ranger  . FEMORAL HERNIA REPAIR  12/25/2012   [name] Dalbert Batman  . FEMORAL HERNIA REPAIR  01/04/2013   Recurrent - [name]. Lilyan Punt  . INCISIONAL HERNIA REPAIR N/A 10/01/2013   Procedure: LAPAROSCOPIC INCISIONAL HERNIA;  Surgeon: [name] Curry, MD;  Location: [facility] ORS;  Service: General;  Laterality: N/A;  . INCISIONAL HERNIA REPAIR N/A 09/07/2018   Procedure: LAPAROSCOPIC ASSISTED REPAIR OF INCISIONAL HERNIA;  Surgeon: Greer Pickerel,  MD;  Location: [facility];  Service: General;  Laterality: N/A;  . INGUINAL HERNIA REPAIR Right 09/26/2012   Procedure: RIGHT INGUINAL HERNIA REPAIR WITH MESH;  Surgeon: Odis Hollingshead, MD;  Location: [place];  Service: General;  Laterality: Right;  . INGUINAL HERNIA REPAIR Right 12/25/2012   Procedure: explor right groin, small bowel rescection, tissue repair right femoral hernia;  Surgeon: Adin Hector, MD;  Location: [facility];  Service: General;  Laterality: Right;  . INSERTION OF MESH Right 09/26/2012   Procedure: INSERTION OF MESH;  Surgeon: Odis Hollingshead, MD;  Location: [facility];  Service: General;  Laterality: Right;  . INSERTION OF MESH N/A 10/01/2013   Procedure: INSERTION OF MESH;  Surgeon: [name] Curry, MD;  Location: [facility];  Service: General;  Laterality: N/A;  . LAPAROSCOPY N/A 01/04/2013   Procedure: Diagnostic Laparoscopy, exploratory laparotomy with small bowel resection, closure of right femoral hernia repair;  Surgeon: Madilyn Hook, DO;  Location: [facility];  Service: General;  Laterality: N/A;  . Left arm surgery    . POLYPECTOMY    . RIGHT BREAST LUMPECTOMY  1988   [name]. Marylene Buerger  . right knee surgery     arthroscopy: [name]. Shellia Carwin  . TONSILLECTOMY    . UPPER GASTROINTESTINAL ENDOSCOPY      Current Medications: Current Meds  Medication Sig  . acetaminophen (TYLENOL) 500 MG tablet Take 1,000 mg by mouth every 8 (eight) hours as needed. For pain  . Ascorbic Acid (VITAMIN C) 1000 MG tablet Take 1,000 mg by mouth at bedtime.   . Biotin 5000 MCG TABS Take 1 tablet by mouth daily.  Marland Kitchen buPROPion (WELLBUTRIN XL) 150 MG 24 hr tablet TAKE 1 TABLET BY MOUTH EVERY DAY  . Calcium Carbonate-Vitamin D (CALCIUM + D PO) Take 2 tablets by mouth daily. 1000 units Vitamin D 1200 mg calcium  . Cyanocobalamin (VITAMIN B 12) 500 MCG TABS Take 1,000 mcg by mouth daily.   Marland Kitchen estradiol (ESTRACE) 0.1 MG/GM vaginal cream Place 1 g vaginally 2 (two) times a week.  Marland Kitchen FERREX 150 150 MG capsule TAKE 1  CAPSULE BY MOUTH EVERY DAY  . fexofenadine (ALLEGRA) 180 MG tablet Take 180 mg by mouth daily as needed for allergies.   . furosemide (LASIX) 20 MG tablet Take 1 tablet (20 mg total) by mouth daily as needed for fluid or edema.  . methylPREDNISolone (MEDROL) 4 MG tablet Medrol dose pack. Take as instructed  . Polyethyl Glycol-Propyl Glycol (SYSTANE) 0.4-0.3 % SOLN Place 1 drop into both eyes daily.  . RABEprazole (ACIPHEX) 20 MG tablet Take one tablet by mouth once daily for stomach  . Rivaroxaban (XARELTO) 15 MG  TABS tablet TAKE 1 TABLET DAILY WITH   SUPPER (DOSE REDUCED)  . SYNTHROID 100 MCG tablet TAKE 1 TABLET BY MOUTH DAILY BEFORE BREAKFAST  . trandolapril (MAVIK) 2 MG tablet TAKE 1 TABLET BY MOUTH TWICE A DAY  . [DISCONTINUED] metoprolol tartrate (LOPRESSOR) 25 MG tablet Take one tablet by mouth as needed for chest pain.  Maximum 2 tablets in one day.   Current Facility-Administered Medications for the 04/28/20 encounter (Office Visit) with Erlene Quan, PA-C  Medication  . 0.9 %  sodium chloride infusion     Allergies:   Hydrocodone, Cephalexin, Ciprofloxacin, Doxycycline, Escitalopram, Levofloxacin, Moxifloxacin, Ofloxacin, Sertraline, Sulfamethoxazole, Tape, Latex, and Neomycin   Social History   Socioeconomic History  . Marital status: Single    Spouse name: Not on file  . Number of children: 0  . Years of education: Not on file  . Highest education level: Not on file  Occupational History  . Occupation: Retired    Fish farm manager: RETIRED  Tobacco Use  . Smoking status: Former Smoker    Packs/day: 1.00    Years: 15.00    Pack years: 15.00    Types: Cigarettes    Quit date: 12/22/1993    Years since quitting: 26.3  . Smokeless tobacco: Never Used  . Tobacco comment: Does'nt reall year quit   Vaping Use  . Vaping Use: Never used  Substance and Sexual Activity  . Alcohol use: No    Alcohol/week: 0.0 standard drinks  . Drug use: No  . Sexual activity: Never  Other  Topics Concern  . Not on file  Social History Narrative   Pt lives alone in a condo   Social Determinants of Health   Financial Resource Strain: Not on file  Food Insecurity: Not on file  Transportation Needs: Not on file  Physical Activity: Not on file  Stress: Not on file  Social Connections: Not on file     Family History: The patient's family history includes Breast cancer in her maternal aunt; Colon cancer in her cousin and paternal uncle; Heart disease in an other family member; Stroke in her father. There is no history of Diabetes, Colon polyps, Rectal cancer, or Stomach cancer.  ROS:   Please see the history of present illness.     All other systems reviewed and are negative.  EKGs/Labs/Other Studies Reviewed:    The following studies were reviewed today: Echo 06/29/2019- IMPRESSIONS    1. Left ventricular ejection fraction, by estimation, is 55 to 60%. The  left ventricle has normal function. The left ventricle has no regional  wall motion abnormalities. Left ventricular diastolic parameters are  consistent with Grade II diastolic  dysfunction (pseudonormalization). Elevated left atrial pressure.  2. Right ventricular systolic function is normal. The right ventricular  size is normal. There is moderately elevated pulmonary artery systolic  pressure.  3. Left atrial size was moderately dilated.  4. The mitral valve is normal in structure. Mild to moderate mitral valve  regurgitation.  5. Tricuspid valve regurgitation is mild to moderate.  6. The aortic valve is tricuspid. Aortic valve regurgitation is mild. No  aortic stenosis is present.  7. Aortic dilatation noted. There is borderline dilatation of the  ascending aorta measuring 37 mm.  8. The inferior vena cava is dilated in size with >50% respiratory  variability, suggesting right atrial pressure of 8 mmHg.   EKG:  EKG is ordered today.  The ekg ordered today demonstrates AF with LBBB, VR  125  Recent  Labs: 11/22/2019: ALT 11; BUN 44; Creat 0.93; Hemoglobin 11.4; Magnesium 2.2; Platelets 167; Potassium 4.9; Sodium 136; TSH 0.47  Recent Lipid Panel    Component Value Date/Time   CHOL 159 11/22/2019 1630   CHOL 170 06/19/2019 1630   TRIG 137 11/22/2019 1630   HDL 53 11/22/2019 1630   HDL 65 06/19/2019 1630   CHOLHDL 3.0 11/22/2019 1630   VLDL 15 07/07/2016 1054   LDLCALC 82 11/22/2019 1630    Physical Exam:    VS:  BP 112/80   Pulse (!) 125   Ht 5' 8.5" (1.74 m)   Wt 137 lb 12.8 oz (62.5 kg)   BMI 20.65 kg/m     Wt Readings from Last 3 Encounters:  04/28/20 137 lb 12.8 oz (62.5 kg)  12/17/19 125 lb 12.8 oz (57.1 kg)  11/28/19 125 lb 12.8 oz (57.1 kg)     GEN: Cachectic caucasian female, in no acute distress HEENT: Normal NECK: No JVD CARDIAC: irregularly irregular with increased rate, no murmurs, rubs, gallops RESPIRATORY:  Decreased breath sounds at bases, no rales ABDOMEN: Soft,  non-distended MUSCULOSKELETAL: 1+ [name] edema; No deformity  SKIN: Warm and dry NEUROLOGIC:  Alert and oriented x 3 PSYCHIATRIC:  Normal affect   ASSESSMENT:    Acute congestive heart failure (HCC) Weight is up 7 lbs, new [name] edema and DOE- I suspect this is from AF. I wanted to go ahead and schedule her for cardioversion but the patient is hesitant. She asked if her pacemaker could be checked first to see if this was a brief episode of atrial fibrillation that we caught in the office or that she has been in it for the last month or so. This is not an unreasonable approach. I suggested we increase her diuretic for couple days, have her take her metoprolol twice daily around-the-clock and not as needed. I explained to her I am going to go ahead and set her up for cardioversion as it is easier to cancel one and then add one on.  Cardiomyopathy, dilated, nonischemic (HCC) EF 55-60% April 2021 echo  Biventricular cardiac pacemaker in situ [name] follows. The patient tells me she  does not do remote checks, she comes in the office to have her pacemaker checked. She is overdue for this, I will see if I can get her in the office this week for a pacer check and we can determine the duration of her atrial fibrillation.  Anticoagulated Intolerant to Eliquis- itching, changed to Xarelto 15 mg. She reports no missed doses  Paroxysmal atrial fibrillation (HCC) Recurrent with RVR today- resume Lopressor 25 mg BID (not PRN)  PLAN:    Metoprolol 25 mg BID, Lasix 40 mg QD x 2 then 20 mg daily, I will arrange for a pacemaker check and check labs today.  I went ahead and scheduled an OP DCCV for March 1st- we can always cancel if she spontaneously converts or declines. I'll ask [name] to review as well.    Medication Adjustments/Labs and Tests Ordered: Current medicines are reviewed at length with the patient today.  Concerns regarding medicines are outlined above.  No orders of the defined types were placed in this encounter.  No orders of the defined types were placed in this encounter.   There are no Patient Instructions on file for this visit.   Angelena Form, PA-C  04/28/2020 3:59 PM    [facility]

## 2020-04-28 NOTE — Assessment & Plan Note (Signed)
Recurrent with RVR today- resume Lopressor 25 mg BID (not PRN)

## 2020-04-28 NOTE — Assessment & Plan Note (Signed)
EF 55-60% April 2021 echo

## 2020-04-29 ENCOUNTER — Ambulatory Visit: Payer: Medicare Other | Admitting: General Practice

## 2020-04-29 LAB — CBC
Hematocrit: 33.4 % — ABNORMAL LOW (ref 34.0–46.6)
Hemoglobin: 11.2 g/dL (ref 11.1–15.9)
MCH: 30.4 pg (ref 26.6–33.0)
MCHC: 33.5 g/dL (ref 31.5–35.7)
MCV: 91 fL (ref 79–97)
Platelets: 216 10*3/uL (ref 150–450)
RBC: 3.68 x10E6/uL — ABNORMAL LOW (ref 3.77–5.28)
RDW: 12.3 % (ref 11.7–15.4)
WBC: 7 10*3/uL (ref 3.4–10.8)

## 2020-04-29 LAB — TSH: TSH: 2.6 u[IU]/mL (ref 0.450–4.500)

## 2020-04-29 LAB — MAGNESIUM: Magnesium: 2.1 mg/dL (ref 1.6–2.3)

## 2020-04-29 LAB — BASIC METABOLIC PANEL
BUN/Creatinine Ratio: 32 — ABNORMAL HIGH (ref 12–28)
BUN: 30 mg/dL — ABNORMAL HIGH (ref 8–27)
CO2: 17 mmol/L — ABNORMAL LOW (ref 20–29)
Calcium: 8.9 mg/dL (ref 8.7–10.3)
Chloride: 106 mmol/L (ref 96–106)
Creatinine, Ser: 0.94 mg/dL (ref 0.57–1.00)
GFR calc Af Amer: 64 mL/min/{1.73_m2} (ref >59)
GFR calc non Af Amer: 55 mL/min/{1.73_m2} — ABNORMAL LOW (ref >59)
Glucose: 88 mg/dL (ref 65–99)
Potassium: 4.4 mmol/L (ref 3.5–5.2)
Sodium: 138 mmol/L (ref 134–144)

## 2020-05-01 ENCOUNTER — Ambulatory Visit (INDEPENDENT_AMBULATORY_CARE_PROVIDER_SITE_OTHER): Payer: Medicare Other | Admitting: Emergency Medicine

## 2020-05-01 ENCOUNTER — Other Ambulatory Visit: Payer: Self-pay

## 2020-05-01 DIAGNOSIS — I480 Paroxysmal atrial fibrillation: Secondary | ICD-10-CM | POA: Diagnosis not present

## 2020-05-01 DIAGNOSIS — I420 Dilated cardiomyopathy: Secondary | ICD-10-CM | POA: Diagnosis not present

## 2020-05-01 MED ORDER — METOPROLOL TARTRATE 25 MG PO TABS: ORAL_TABLET | ORAL | 3 refills | Status: DC

## 2020-05-01 NOTE — Patient Instructions (Signed)
Take metoprolol 1 1/2 tablets (37.5 mg in the morning and metoprolol 1 tablet(25 mg) in th evening. Call the office if you have a change in condition. Avoid salty foods like processed foods or restaurant food that is not low sodium. Weigh yourself each morning and call the cardiologist if you have a 2-3 LB weight gain over night. Go to the emergency room if you develop ongoing chest pain, chest tightness or if you pass out.

## 2020-05-02 ENCOUNTER — Telehealth: Payer: Self-pay | Admitting: Cardiology

## 2020-05-02 LAB — CUP PACEART INCLINIC DEVICE CHECK
Brady Statistic RA Percent Paced: 18 %
Brady Statistic RV Percent Paced: 69 %
Date Time Interrogation Session: 20220224043000
Implantable Lead Implant Date: 20070309
Implantable Lead Implant Date: 20070309
Implantable Lead Implant Date: 20070309
Implantable Lead Location: 753858
Implantable Lead Location: 753859
Implantable Lead Location: 753860
Implantable Lead Model: 158
Implantable Lead Model: 4087
Implantable Lead Model: 4543
Implantable Lead Serial Number: 121652
Implantable Lead Serial Number: 168416
Implantable Lead Serial Number: 268502
Implantable Pulse Generator Implant Date: 20201120
Lead Channel Impedance Value: 340 Ohm
Lead Channel Impedance Value: 413 Ohm
Lead Channel Impedance Value: 508 Ohm
Lead Channel Pacing Threshold Amplitude: 0.7 V
Lead Channel Pacing Threshold Amplitude: 3 V
Lead Channel Pacing Threshold Pulse Width: 1 ms
Lead Channel Pacing Threshold Pulse Width: 1 ms
Lead Channel Sensing Intrinsic Amplitude: 1.2 mV
Lead Channel Sensing Intrinsic Amplitude: 9.5 mV
Lead Channel Setting Pacing Amplitude: 2.6 V
Lead Channel Setting Pacing Amplitude: 4 V
Lead Channel Setting Pacing Amplitude: 5 V
Lead Channel Setting Pacing Pulse Width: 1 ms
Lead Channel Setting Pacing Pulse Width: 1 ms
Lead Channel Setting Sensing Sensitivity: 2.5 mV
Lead Channel Setting Sensing Sensitivity: 2.5 mV
Pulse Gen Serial Number: 714246

## 2020-05-02 NOTE — Progress Notes (Addendum)
CRT-P device check in clinic. Normal device function. Thresholds, sensing, impedance consistent with previous measurements. Histograms appropriate for patient and level of activity. AT/Af burden 19%, ongoing AF, scheduled for DCCV 05/06/20, + Xarelto.  with episodes of RVR. 63 ventricular high rate episodes available EGMs show AF with RVR. Patient bi-ventricularly pacing 69% of the time. Device programmed with appropriate safety margins. Device heart failure diagnostics are within normal limits and stable over time. Estimated longevity 9.5 years. Patient not enrolled in remote follow-up. Plan to check device in clinic in 6 months. Metoprolol increased  by Dr. Lovena Le to 37.5 mg am and 25 mg pm.

## 2020-05-02 NOTE — Telephone Encounter (Signed)
I called the patient this morning.  She continues to feel "terrible".  Fatigue with any exertion. I discussed the plans for cardioversion with her again and reinforced NPO after midnight Tuesday. She knows to go to the ED if her symptoms worsen.  Kerin Ransom PA-C 05/02/2020 10:22 AM

## 2020-05-03 ENCOUNTER — Other Ambulatory Visit (HOSPITAL_COMMUNITY)
Admission: RE | Admit: 2020-05-03 | Discharge: 2020-05-03 | Disposition: A | Discharge status: Home / Self Care | Payer: Medicare Other | Source: Ambulatory Visit | Attending: Internal Medicine | Admitting: Internal Medicine

## 2020-05-03 DIAGNOSIS — Z20822 Contact with and (suspected) exposure to covid-19: Secondary | ICD-10-CM | POA: Insufficient documentation

## 2020-05-03 DIAGNOSIS — Z01812 Encounter for preprocedural laboratory examination: Secondary | ICD-10-CM | POA: Diagnosis not present

## 2020-05-03 LAB — SARS CORONAVIRUS 2 (TAT 6-24 HRS): SARS Coronavirus 2: NEGATIVE

## 2020-05-06 ENCOUNTER — Ambulatory Visit (HOSPITAL_COMMUNITY): Payer: Medicare Other | Admitting: Certified Registered Nurse Anesthetist

## 2020-05-06 ENCOUNTER — Ambulatory Visit (HOSPITAL_COMMUNITY)
Admission: RE | Admit: 2020-05-06 | Discharge: 2020-05-06 | Disposition: A | Discharge status: Home / Self Care | Payer: Medicare Other | Attending: Internal Medicine | Admitting: Internal Medicine

## 2020-05-06 ENCOUNTER — Encounter (HOSPITAL_COMMUNITY): Payer: Self-pay | Admitting: Internal Medicine

## 2020-05-06 ENCOUNTER — Encounter (HOSPITAL_COMMUNITY)
Admission: RE | Disposition: A | Discharge status: Home / Self Care | Payer: Self-pay | Source: Home / Self Care | Attending: Internal Medicine

## 2020-05-06 DIAGNOSIS — I420 Dilated cardiomyopathy: Secondary | ICD-10-CM | POA: Insufficient documentation

## 2020-05-06 DIAGNOSIS — Z888 Allergy status to other drugs, medicaments and biological substances status: Secondary | ICD-10-CM | POA: Insufficient documentation

## 2020-05-06 DIAGNOSIS — Z950 Presence of cardiac pacemaker: Secondary | ICD-10-CM | POA: Insufficient documentation

## 2020-05-06 DIAGNOSIS — I509 Heart failure, unspecified: Secondary | ICD-10-CM | POA: Diagnosis not present

## 2020-05-06 DIAGNOSIS — Z87891 Personal history of nicotine dependence: Secondary | ICD-10-CM | POA: Insufficient documentation

## 2020-05-06 DIAGNOSIS — I110 Hypertensive heart disease with heart failure: Secondary | ICD-10-CM | POA: Diagnosis not present

## 2020-05-06 DIAGNOSIS — Z882 Allergy status to sulfonamides status: Secondary | ICD-10-CM | POA: Insufficient documentation

## 2020-05-06 DIAGNOSIS — Z79899 Other long term (current) drug therapy: Secondary | ICD-10-CM | POA: Diagnosis not present

## 2020-05-06 DIAGNOSIS — I714 Abdominal aortic aneurysm, without rupture: Secondary | ICD-10-CM | POA: Diagnosis not present

## 2020-05-06 DIAGNOSIS — Z79890 Hormone replacement therapy (postmenopausal): Secondary | ICD-10-CM | POA: Diagnosis not present

## 2020-05-06 DIAGNOSIS — Z885 Allergy status to narcotic agent status: Secondary | ICD-10-CM | POA: Diagnosis not present

## 2020-05-06 DIAGNOSIS — I4819 Other persistent atrial fibrillation: Secondary | ICD-10-CM

## 2020-05-06 DIAGNOSIS — Z7901 Long term (current) use of anticoagulants: Secondary | ICD-10-CM | POA: Insufficient documentation

## 2020-05-06 DIAGNOSIS — I480 Paroxysmal atrial fibrillation: Secondary | ICD-10-CM | POA: Insufficient documentation

## 2020-05-06 DIAGNOSIS — Z881 Allergy status to other antibiotic agents status: Secondary | ICD-10-CM | POA: Diagnosis not present

## 2020-05-06 DIAGNOSIS — I5023 Acute on chronic systolic (congestive) heart failure: Secondary | ICD-10-CM | POA: Diagnosis not present

## 2020-05-06 HISTORY — PX: CARDIOVERSION: SHX1299

## 2020-05-06 SURGERY — CARDIOVERSION
Anesthesia: General

## 2020-05-06 MED ORDER — LIDOCAINE 2% (20 MG/ML) 5 ML SYRINGE
INTRAMUSCULAR | Status: DC | PRN
  Administered 2020-05-06: 40 mg via INTRAVENOUS

## 2020-05-06 MED ORDER — PROPOFOL 10 MG/ML IV BOLUS
INTRAVENOUS | Status: DC | PRN
  Administered 2020-05-06: 50 mg via INTRAVENOUS
  Administered 2020-05-06: 20 mg via INTRAVENOUS

## 2020-05-06 MED ORDER — SODIUM CHLORIDE 0.9 % IV SOLN: INTRAVENOUS | Status: DC | PRN

## 2020-05-06 NOTE — Anesthesia Procedure Notes (Signed)
Procedure Name: General with mask airway Date/Time: 05/06/2020 12:36 PM Performed by: Colin Benton, CRNA Pre-anesthesia Checklist: Patient identified, Timeout performed, Suction available, Patient being monitored and Emergency Drugs available Patient Re-evaluated:Patient Re-evaluated prior to induction Oxygen Delivery Method: Ambu bag Preoxygenation: Pre-oxygenation with 100% oxygen Induction Type: IV induction Ventilation: Mask ventilation without difficulty Placement Confirmation: positive ETCO2 Dental Injury: Teeth and Oropharynx as per pre-operative assessment

## 2020-05-06 NOTE — Anesthesia Preprocedure Evaluation (Signed)
Anesthesia Evaluation  Patient identified by MRN, date of birth, ID band Patient awake    Reviewed: Allergy & Precautions, NPO status , Patient's Chart, lab work & pertinent test results, reviewed documented beta blocker date and time   History of Anesthesia Complications Negative for: history of anesthetic complications  Airway Mallampati: II  TM Distance: >3 FB Neck ROM: Full    Dental no notable dental hx.    Pulmonary former smoker,    Pulmonary exam normal        Cardiovascular hypertension, Pt. on home beta blockers and Pt. on medications + CAD and +CHF  Normal cardiovascular exam+ dysrhythmias + pacemaker   TTE 4/21: Left ventricular ejection fraction, by estimation, is 55 to 60%. The  left ventricle has normal function. The left ventricle has no regional  wall motion abnormalities. Left ventricular diastolic parameters are  consistent with Grade II diastolic  dysfunction (pseudonormalization). Elevated left atrial pressure.  2. Right ventricular systolic function is normal. The right ventricular  size is normal. There is moderately elevated pulmonary artery systolic  pressure.  3. Left atrial size was moderately dilated.  4. The mitral valve is normal in structure. Mild to moderate mitral valve  regurgitation.  5. Tricuspid valve regurgitation is mild to moderate.  6. The aortic valve is tricuspid. Aortic valve regurgitation is mild. No  aortic stenosis is present.  7. Aortic dilatation noted. There is borderline dilatation of the  ascending aorta measuring 37 mm.  8. The inferior vena cava is dilated in size with >50% respiratory  variability, suggesting right atrial pressure of 8 mmHg.    Neuro/Psych Anxiety Depression negative neurological ROS     GI/Hepatic Neg liver ROS, PUD, GERD  Controlled,  Endo/Other  Hypothyroidism   Renal/GU negative Renal ROS  negative genitourinary    Musculoskeletal  (+) Arthritis ,   Abdominal   Peds  Hematology negative hematology ROS (+)   Anesthesia Other Findings Day of surgery medications reviewed with patient.  Reproductive/Obstetrics negative OB ROS                             Anesthesia Physical Anesthesia Plan  ASA: III  Anesthesia Plan: General   Post-op Pain Management:    Induction: Intravenous  PONV Risk Score and Plan: Treatment may vary due to age or medical condition and Propofol infusion  Airway Management Planned: Mask  Additional Equipment: None  Intra-op Plan:   Post-operative Plan:   Informed Consent: I have reviewed the patients History and Physical, chart, labs and discussed the procedure including the risks, benefits and alternatives for the proposed anesthesia with the patient or authorized representative who has indicated his/her understanding and acceptance.       Plan Discussed with: CRNA  Anesthesia Plan Comments:         Anesthesia Quick Evaluation

## 2020-05-06 NOTE — Discharge Instructions (Signed)
Electrical Cardioversion Electrical cardioversion is the delivery of a jolt of electricity to restore a normal rhythm to the heart. A rhythm that is too fast or is not regular keeps the heart from pumping well. In this procedure, sticky patches or metal paddles are placed on the chest to deliver electricity to the heart from a device. This procedure may be done in an emergency if:  There is low or no blood pressure as a result of the heart rhythm.  Normal rhythm must be restored as fast as possible to protect the brain and heart from further damage.  It may save a life. This may also be a scheduled procedure for irregular or fast heart rhythms that are not immediately life-threatening. Tell a health care provider about:  Any allergies you have.  All medicines you are taking, including vitamins, herbs, eye drops, creams, and over-the-counter medicines.  Any problems you or family members have had with anesthetic medicines.  Any blood disorders you have.  Any surgeries you have had.  Any medical conditions you have.  Whether you are pregnant or may be pregnant. What are the risks? Generally, this is a safe procedure. However, problems may occur, including:  Allergic reactions to medicines.  A blood clot that breaks free and travels to other parts of your body.  The possible return of an abnormal heart rhythm within hours or days after the procedure.  Your heart stopping (cardiac arrest). This is rare. What happens before the procedure? Medicines  Your health care provider may have you start taking: ? Blood-thinning medicines (anticoagulants) so your blood does not clot as easily. ? Medicines to help stabilize your heart rate and rhythm.  Ask your health care provider about: ? Changing or stopping your regular medicines. This is especially important if you are taking diabetes medicines or blood thinners. ? Taking medicines such as aspirin and ibuprofen. These medicines can  thin your blood. Do not take these medicines unless your health care provider tells you to take them. ? Taking over-the-counter medicines, vitamins, herbs, and supplements. General instructions  Follow instructions from your health care provider about eating or drinking restrictions.  Plan to have someone take you home from the hospital or clinic.  If you will be going home right after the procedure, plan to have someone with you for 24 hours.  Ask your health care provider what steps will be taken to help prevent infection. These may include washing your skin with a germ-killing soap. What happens during the procedure?  An IV will be inserted into one of your veins.  Sticky patches (electrodes) or metal paddles may be placed on your chest.  You will be given a medicine to help you relax (sedative).  An electrical shock will be delivered. The procedure may vary among health care providers and hospitals.   What can I expect after the procedure?  Your blood pressure, heart rate, breathing rate, and blood oxygen level will be monitored until you leave the hospital or clinic.  Your heart rhythm will be watched to make sure it does not change.  You may have some redness on the skin where the shocks were given. Follow these instructions at home:  Do not drive for 24 hours if you were given a sedative during your procedure.  Take over-the-counter and prescription medicines only as told by your health care provider.  Ask your health care provider how to check your pulse. Check it often.  Rest for 48 hours after the procedure   or as told by your health care provider.  Avoid or limit your caffeine use as told by your health care provider.  Keep all follow-up visits as told by your health care provider. This is important. Contact a health care provider if:  You feel like your heart is beating too quickly or your pulse is not regular.  You have a serious muscle cramp that does not go  away. Get help right away if:  You have discomfort in your chest.  You are dizzy or you feel faint.  You have trouble breathing or you are short of breath.  Your speech is slurred.  You have trouble moving an arm or leg on one side of your body.  Your fingers or toes turn cold or blue. Summary  Electrical cardioversion is the delivery of a jolt of electricity to restore a normal rhythm to the heart.  This procedure may be done right away in an emergency or may be a scheduled procedure if the condition is not an emergency.  Generally, this is a safe procedure.  After the procedure, check your pulse often as told by your health care provider. This information is not intended to replace advice given to you by your health care provider. Make sure you discuss any questions you have with your health care provider. Document Revised: 09/25/2018 Document Reviewed: 09/25/2018 Elsevier Patient Education  2021 [employer]vier Inc.  

## 2020-05-06 NOTE — Transfer of Care (Signed)
Immediate Anesthesia Transfer of Care Note  Patient: [name]  Procedure(s) Performed: CARDIOVERSION (N/A )  Patient Location: Endoscopy Unit  Anesthesia Type:General  Level of Consciousness: drowsy  Airway & Oxygen Therapy: Patient Spontanous Breathing  Post-op Assessment: Report given to RN and Post -op Vital signs reviewed and stable  Post vital signs: Reviewed and stable  Last Vitals:  Vitals Value Taken Time  BP 92/52 05/06/20 1237  Temp    Pulse 56 05/06/20 1244  Resp 25 05/06/20 1244  SpO2 93 % 05/06/20 1244    Last Pain:  Vitals:   05/06/20 1200  TempSrc: Temporal  PainSc: 0-No pain         Complications: No complications documented.

## 2020-05-06 NOTE — CV Procedure (Signed)
   CARDIOVERSION NOTE  Procedure: Electrical Cardioversion Indications:  Atrial Fibrillation  Procedure Details:  Consent: Risks of procedure as well as the alternatives and risks of each were explained to the (patient/caregiver).  Consent for procedure obtained.  Time Out: Verified patient identification, verified procedure, site/side was marked, verified correct patient position, special equipment/implants available, medications/allergies/relevent history reviewed, required imaging and test results available.  Performed  Patient placed on cardiac monitor, pulse oximetry, supplemental oxygen as necessary.  Sedation given: propofol per anesthesia Pacer pads placed anterior and posterior chest.  Cardioverted 3 time(s).  Cardioverted at 150J and 200J x 2 (second effort with pressure applied) biphasic.  Impression: Findings: Post procedure EKG shows: Bi-V paced rhythm Complications: None Patient did tolerate procedure well.  Plan: 1. Ultimately successful DCCV after 3 stacked shocks.  Time Spent Directly with the Patient:  30 minutes   Pixie Casino, MD, [facility], [place] Director of the [facility]  [facility] Diplomate of the American Board of Clinical Lipidology Attending Cardiologist  Direct Dial: [phone]  Fax: [phone]  Website:  [email or web address] 05/06/2020, 12:41 PM

## 2020-05-06 NOTE — Anesthesia Postprocedure Evaluation (Signed)
Anesthesia Post Note  Patient: [name]  Procedure(s) Performed: CARDIOVERSION (N/A )     Patient location during evaluation: PACU Anesthesia Type: General Level of consciousness: awake and alert and oriented Pain management: pain level controlled Vital Signs Assessment: post-procedure vital signs reviewed and stable Respiratory status: spontaneous breathing, nonlabored ventilation and respiratory function stable Cardiovascular status: blood pressure returned to baseline Postop Assessment: no apparent nausea or vomiting Anesthetic complications: no   No complications documented.  Last Vitals:  Vitals:   05/06/20 1317 05/06/20 1327  BP: 108/62 103/69  Pulse: (!) 59 61  Resp: 14 17  Temp:    SpO2: 93% 95%    Last Pain:  Vitals:   05/06/20 1317  TempSrc:   PainSc: 0-No pain                 Brennan Bailey

## 2020-05-06 NOTE — Interval H&P Note (Signed)
History and Physical Interval Note:  05/06/2020 12:24 PM  [name]  has presented today for surgery, with the diagnosis of AFIB.  The various methods of treatment have been discussed with the patient and family. After consideration of risks, benefits and other options for treatment, the patient has consented to  Procedure(s): CARDIOVERSION (N/A) as a surgical intervention.  The patient's history has been reviewed, patient examined, no change in status, stable for surgery.  I have reviewed the patient's chart and labs.  Questions were answered to the patient's satisfaction.     Pixie Casino

## 2020-05-07 ENCOUNTER — Encounter (HOSPITAL_COMMUNITY): Payer: Self-pay | Admitting: Internal Medicine

## 2020-05-09 ENCOUNTER — Telehealth: Payer: Self-pay | Admitting: Cardiology

## 2020-05-09 NOTE — Telephone Encounter (Signed)
Spoke to patient she stated she has gained 20 lbs within the past 1 month.She has increased swelling in both lower legs.Sob with exertion.Stated she had a cardioversion 05/06/20.She feels like her heart is in rhythm.She read side effects on Metoprolol and thinks that is causing swelling. She had swelling in lower legs when she saw PA on 04/29/19.Stated swelling is worse.She does eat salt.Advised to avoid salt.Advised to double Lasix dose for the next 3 days then return to normal dose.Advised to wear compression hose and keep feet elevated when sitting.Dr.Jordan is out of office.I will make him aware.

## 2020-05-09 NOTE — Telephone Encounter (Signed)
New Message:      Please call, concerning her Metoprolol.

## 2020-05-12 NOTE — Telephone Encounter (Signed)
Called patient left message on personal voice I was calling to see how you were doing and to let you know I spoke to [place].Advised to call me back.

## 2020-05-13 NOTE — Telephone Encounter (Signed)
Received a call from patient she stated her lower legs continue to be swollen.Stated she doubled Lasix for 3 days.She cannot see any improvement.Dr.Jordan advised it might take a couple of weeks after cardioversion to see any improvement in symptoms.Stated she would like to be seen.No extender appointments available.Appointment scheduled with Dr.Jordan 3/11 at 4:30 pm.

## 2020-05-14 ENCOUNTER — Telehealth: Payer: Medicare Other | Admitting: Cardiology

## 2020-05-14 ENCOUNTER — Telehealth: Payer: Self-pay | Admitting: Emergency Medicine

## 2020-05-14 NOTE — Telephone Encounter (Signed)
Patient does not wish to change to remote home follow-up for pacemaker. 6 month in-clinic check scheduled 10/30/20 @ 1.520

## 2020-05-15 NOTE — Progress Notes (Signed)
[name] Date of Birth: 1932-03-31 Medical Record #[id number]  History of Present Illness: [name] is seen today for follow up. She has multiple medical issues which include a cardiomyopathy, underlying BiV/ICD which resulted in improvement of her EF, LBBB, HTN, thyroid disease, melanoma, anxiety/depression and chronic systolic HF.   In September 2018 she was noted in device clinic to have paroxysmal Afib with controlled rate. She was started on Eliquis. [place] Vasc score of at least 4. She developed itching on Eliquis and switched to Xarelto. Itching did not go away so she was switched to Coumadin. After about a month the itching resolved.  She was later switched back to Xarelto without rash. She had change out of her ICD generator in November due to ERI. Her device was downgraded to Biventricular pacing only. Follow up with Dr Lovena Le  In September showed normal device function.  She was seen in the office on 04/28/20 with complaints of increased SOB, edema and fatigue. Was found to be in Afib with rate 125. She underwent successful DCCV on 05/06/20.   On follow up today she is not doing well. She notes marked swelling and weeping in her legs.  Feels fatigued with no energy. Some SOB. Is not really aware of her rhythm but states she noted no improvement with her cardioversion. Has onlhy been taking half of her metoprolol. Did take her trandolapril. Hasn't missed any doses of her Xarelto.   Current Outpatient Medications  Medication Sig Dispense Refill  . acetaminophen (TYLENOL) 500 MG tablet Take 1,000 mg by mouth every 8 (eight) hours as needed for moderate pain or mild pain.    . Ascorbic Acid (VITAMIN C) 1000 MG tablet Take 1,000 mg by mouth at bedtime. Ester C    . Biotin 5000 MCG TABS Take 5,000 mcg by mouth daily.    Marland Kitchen buPROPion (WELLBUTRIN XL) 150 MG 24 hr tablet TAKE 1 TABLET BY MOUTH EVERY DAY (Patient taking differently: Take 150 mg by mouth daily.) 90 tablet 1  . Calcium  Carbonate-Vitamin D (CALCIUM + D PO) Take 2 tablets by mouth daily. 1000 units Vitamin D 1200 mg calcium    . Cyanocobalamin (VITAMIN B 12) 500 MCG TABS Take 1,000 mcg by mouth daily.     Marland Kitchen estradiol (ESTRACE) 0.1 MG/GM vaginal cream Place 1 g vaginally 2 (two) times a week.    Marland Kitchen FERREX 150 150 MG capsule TAKE 1 CAPSULE BY MOUTH EVERY DAY (Patient taking differently: Take 150 mg by mouth daily.) 90 capsule 1  . fexofenadine-pseudoephedrine (ALLEGRA-D) 60-120 MG 12 hr tablet Take 1 tablet by mouth daily as needed (Allergies).    . furosemide (LASIX) 20 MG tablet Take 1 tablet (20 mg total) by mouth daily as needed for fluid or edema. 30 tablet 6  . metoprolol tartrate (LOPRESSOR) 25 MG tablet Take metoprolol 37.5 mg in AM and metoprolol 25 mg in evening. 180 tablet 3  . Polyethyl Glycol-Propyl Glycol (SYSTANE) 0.4-0.3 % SOLN Place 1 drop into both eyes daily.    . RABEprazole (ACIPHEX) 20 MG tablet Take one tablet by mouth once daily for stomach (Patient taking differently: Take 20 mg by mouth daily.) 90 tablet 1  . Rivaroxaban (XARELTO) 15 MG TABS tablet TAKE 1 TABLET DAILY WITH   SUPPER (DOSE REDUCED) (Patient taking differently: Take 15 mg by mouth daily with supper.) 90 tablet 3  . SYNTHROID 100 MCG tablet TAKE 1 TABLET BY MOUTH DAILY BEFORE BREAKFAST (Patient taking differently: Take 100 mcg  by mouth daily before breakfast.) 90 tablet 1  . trandolapril (MAVIK) 2 MG tablet TAKE 1 TABLET BY MOUTH TWICE A DAY (Patient taking differently: Take 2 mg by mouth 2 (two) times daily.) 180 tablet 3   Current Facility-Administered Medications  Medication Dose Route Frequency Provider Last Rate Last Admin  . 0.9 %  sodium chloride infusion  500 mL Intravenous Once Milus Banister, MD        Allergies  Allergen Reactions  . Hydrocodone Itching  . Cephalexin Itching and Swelling  . Ciprofloxacin     Not indicated due to aneurysm in aorta    . Doxycycline Other (See Comments)    Per pt: unknown  .  Escitalopram     Dry mouth  . Levofloxacin     Not indicated due to aneurysm in aorta   . Moxifloxacin     Not indicated due to aneurysm in aorta   . Ofloxacin Other (See Comments)    Per pt: unknown  . Sertraline     insomnia  . Sulfamethoxazole Hives    Any sulfa meds.  . Tape Other (See Comments)    Burns skin.   . Latex Rash    "If pt. Makes contact with or wearing"  . Neomycin Rash    Past Medical History:  Diagnosis Date  . AAA (abdominal aortic aneurysm) (HCC)    2.8 cm 05/2018; 5 year [place] per ACR guidelines  . Allergy    all year   . Anxiety disorder   . Arthritis   . Barrett's esophagus 04/2008  . Benign neoplasm of colon   . Blood transfusion without reported diagnosis    long ago per pt  . Cataract    removed both eyes  . CHF (congestive heart failure) ([place])   . Chronic airway obstruction, not elsewhere classified   . Complication of anesthesia    slow to wake up  . Depressive disorder, not elsewhere classified   . Diverticulitis   . Dysthymic disorder   . Erosive gastritis 08/2004  . GERD (gastroesophageal reflux disease)   . Hair thinning   . HTN (hypertension)    pt denies   . Hyperplastic polyps of stomach 06/2002  . Hypothyroidism   . ICD (implantable cardiac defibrillator) in place    BiV/ICD; s/p removal of ICD with insertion of BiV PPM 04/26/12  . Incisional hernia   . Insomnia 08/20/2015  . Kyphosis 08/20/2015  . LBBB (left bundle branch block)   . Melanoma ([place])    right arm  . Mitral regurgitation   . Nonischemic cardiomyopathy ([place])    EF initially 20%; Last measurement up to 50% per echo in August of 2012  . Osteopenia   . Other and unspecified hyperlipidemia   . Other and unspecified hyperlipidemia   . Pacemaker 04/26/2012  . PAF (paroxysmal atrial fibrillation) ([place])    11/2016  . Pain in joint, lower leg   . Palpitations   . Pneumonia   . Restless legs syndrome (RLS)   . Synovial cyst of popliteal space   . Unspecified chronic  bronchitis ([place])   . Unspecified nasal polyp   . Unspecified sinusitis (chronic)     Past Surgical History:  Procedure Laterality Date  . ABDOMINAL HYSTERECTOMY  1076   endometriosis  . APPENDECTOMY    . BI-VENTRICULAR PACEMAKER INSERTION N/A 04/26/2012   Procedure: BI-VENTRICULAR PACEMAKER INSERTION (CRT-P);  Surgeon: Evans Lance, MD;  Location: [facility] CATH LAB;  Service: Cardiovascular;  Laterality: N/A;  . BIV PACEMAKER GENERATOR CHANGEOUT N/A 01/26/2019   Procedure: BIV PACEMAKER GENERATOR CHANGEOUT;  Surgeon: Evans Lance, MD;  Location: [facility] CV LAB;  Service: Cardiovascular;  Laterality: N/A;  . CARDIOVERSION N/A 05/06/2020   Procedure: CARDIOVERSION;  Surgeon: Pixie Casino, MD;  Location: [facility] ENDOSCOPY;  Service: Cardiovascular;  Laterality: N/A;  . COLONOSCOPY    . defibrillator insertion     s/p removal of previously implanted BiV ICD and insertion of a new BiV pacemaker on 04/26/12  . excise mole of lip  2001   Dr. Larena Sox  . excision of wen  1984   Dr. Parks Ranger  . FEMORAL HERNIA REPAIR  12/25/2012   Dr Dalbert Batman  . FEMORAL HERNIA REPAIR  01/04/2013   Recurrent - Dr. Lilyan Punt  . INCISIONAL HERNIA REPAIR N/A 10/01/2013   Procedure: LAPAROSCOPIC INCISIONAL HERNIA;  Surgeon: Gayland Curry, MD;  Location: [facility] ORS;  Service: General;  Laterality: N/A;  . INCISIONAL HERNIA REPAIR N/A 09/07/2018   Procedure: LAPAROSCOPIC ASSISTED REPAIR OF INCISIONAL HERNIA;  Surgeon: Greer Pickerel, MD;  Location: [facility];  Service: General;  Laterality: N/A;  . INGUINAL HERNIA REPAIR Right 09/26/2012   Procedure: RIGHT INGUINAL HERNIA REPAIR WITH MESH;  Surgeon: Odis Hollingshead, MD;  Location: [facility];  Service: General;  Laterality: Right;  . INGUINAL HERNIA REPAIR Right 12/25/2012   Procedure: explor right groin, small bowel rescection, tissue repair right femoral hernia;  Surgeon: Adin Hector, MD;  Location: [facility] ORS;  Service: General;  Laterality: Right;  . INSERTION OF MESH Right  09/26/2012   Procedure: INSERTION OF MESH;  Surgeon: Odis Hollingshead, MD;  Location: [facility];  Service: General;  Laterality: Right;  . INSERTION OF MESH N/A 10/01/2013   Procedure: INSERTION OF MESH;  Surgeon: Gayland Curry, MD;  Location: [facility]S;  Service: General;  Laterality: N/A;  . LAPAROSCOPY N/A 01/04/2013   Procedure: Diagnostic Laparoscopy, exploratory laparotomy with small bowel resection, closure of right femoral hernia repair;  Surgeon: Madilyn Hook, DO;  Location: [facility] ORS;  Service: General;  Laterality: N/A;  . Left arm surgery    . POLYPECTOMY    . RIGHT BREAST LUMPECTOMY  1988   Dr. Marylene Buerger  . right knee surgery     arthroscopy: Dr. Shellia Carwin  . TONSILLECTOMY    . UPPER GASTROINTESTINAL ENDOSCOPY      Social History   Tobacco Use  Smoking Status Former Smoker  . Packs/day: 1.00  . Years: 15.00  . Pack years: 15.00  . Types: Cigarettes  . Quit date: 12/22/1993  . Years since quitting: 26.4  Smokeless Tobacco Never Used  Tobacco Comment   Does'nt reall year quit     Social History   Substance and Sexual Activity  Alcohol Use No  . Alcohol/week: 0.0 standard drinks    Family History  Problem Relation Age of Onset  . Stroke Father   . Heart disease Other        maternal side  . Colon cancer Paternal Uncle   . Breast cancer Maternal Aunt   . Colon cancer Cousin   . Diabetes Neg Hx   . Colon polyps Neg Hx   . Rectal cancer Neg Hx   . Stomach cancer Neg Hx     Review of Systems: The review of systems is per the HPI.   All other systems were reviewed and are negative.  Physical Exam: BP (!) 88/62   Pulse (!) 139  Ht 5\' 8"  (1.727 m)   Wt 148 lb (67.1 kg)   SpO2 99%   BMI 22.50 kg/m  GENERAL:  thin, elderly thin WF appears ill.  HEENT:  PERRL, EOMI, sclera are clear. Oropharynx is clear. NECK:  +JVD, carotid upstroke brisk and symmetric, no bruits, no thyromegaly or adenopathy LUNGS:  Clear to auscultation bilaterally CHEST:   Unremarkable HEART:  IRRR- tachy,  PMI not displaced or sustained,S1 and S2 within normal limits, no S3, no S4: no clicks, no rubs, no murmurs ABD:  Soft, nontender. BS +, no masses or bruits. No hepatomegaly, no splenomegaly EXT:  2 + pulses throughout, 3+ edema with active weeping right leg. no cyanosis no clubbing SKIN:  Warm and dry.  No rashes NEURO:  Alert and oriented x 3. Cranial nerves II through XII intact. PSYCH:  Cognitively intact   Wt Readings from Last 3 Encounters:  05/16/20 148 lb (67.1 kg)  05/06/20 142 lb (64.4 kg)  04/28/20 137 lb 12.8 oz (62.5 kg)     LABORATORY DATA: Lab Results  Component Value Date   WBC 7.0 04/28/2020   HGB 11.2 04/28/2020   HCT 33.4 (L) 04/28/2020   PLT 216 04/28/2020   GLUCOSE 88 04/28/2020   CHOL 159 11/22/2019   TRIG 137 11/22/2019   HDL 53 11/22/2019   LDLCALC 82 11/22/2019   ALT 11 11/22/2019   AST 19 11/22/2019   NA 138 04/28/2020   K 4.4 04/28/2020   CL 106 04/28/2020   CREATININE 0.94 04/28/2020   BUN 30 (H) 04/28/2020   CO2 17 (L) 04/28/2020   TSH 2.600 04/28/2020   INR 1.0 09/07/2018   Ecg today: AFib with RVR 139. NSIVCD. I have personally reviewed and interpreted this study.   Echo: 07/22/16: Study Conclusions  - Left ventricle: The cavity size was mildly dilated. Wall   thickness was normal. Systolic function was normal. The estimated   ejection fraction was in the range of 50% to 55%. Wall motion was   normal; there were no regional wall motion abnormalities. - Aortic valve: There was trivial regurgitation. - Mitral valve: There was mild regurgitation. - Atrial septum: No defect or patent foramen ovale was identified.  Echo 06/29/19: IMPRESSIONS    1. Left ventricular ejection fraction, by estimation, is 55 to 60%. The  left ventricle has normal function. The left ventricle has no regional  wall motion abnormalities. Left ventricular diastolic parameters are  consistent with Grade II diastolic   dysfunction (pseudonormalization). Elevated left atrial pressure.  2. Right ventricular systolic function is normal. The right ventricular  size is normal. There is moderately elevated pulmonary artery systolic  pressure.  3. Left atrial size was moderately dilated.  4. The mitral valve is normal in structure. Mild to moderate mitral valve  regurgitation.  5. Tricuspid valve regurgitation is mild to moderate.  6. The aortic valve is tricuspid. Aortic valve regurgitation is mild. No  aortic stenosis is present.  7. Aortic dilatation noted. There is borderline dilatation of the  ascending aorta measuring 37 mm.  8. The inferior vena cava is dilated in size with >50% respiratory  variability, suggesting right atrial pressure of 8 mmHg.   Assessment / Plan: 1.  History of dialted Cardiomyopathy with chronic systolic CHF.  EF last normal in April 2021. Has BiV pacemaker in place. Now with significant CHF. 23 lbs up on weight. Significant edema. Suspect this is mostly related to her Afib with RVR. Needs IV lasix. Rate control  with IV cardizem. Repeat Echo. Given low BP and uncontrolled rate she needs inpatient therapy. Patient directed to the ED. Notified our service of admit. Hold ACEi due to low BP.  2. NSVT. Follow up ICD/biV in device clinic. S/p generator change out in November 2020. Normal device function.  3. Paroxysmal Afib. Now persistent and ERAD post cardioversion on March 1. RVR. [place] Vasc score of at least 4. On Xarelto. Needs rate control with IV Cardizem. Recommend AAD therapy. Discussed with Dr Rayann Heman. Seems to be a good candidate for Dofetilide if labs OK. Will plan on load this weekend with repeat DCCV once loaded.   4. Small AAA and left iliac aneurysm. Follow up with [facility]. Last doppler showed this was 2.9 cm.

## 2020-05-16 ENCOUNTER — Inpatient Hospital Stay (HOSPITAL_COMMUNITY)
Admission: EM | Admit: 2020-05-16 | Discharge: 2020-05-20 | DRG: 308 | Disposition: A | Discharge status: Home / Self Care | Payer: Medicare Other | Source: Ambulatory Visit | Attending: Cardiology | Admitting: Cardiology

## 2020-05-16 ENCOUNTER — Ambulatory Visit (INDEPENDENT_AMBULATORY_CARE_PROVIDER_SITE_OTHER): Payer: Medicare Other | Admitting: Cardiology

## 2020-05-16 ENCOUNTER — Other Ambulatory Visit: Payer: Self-pay

## 2020-05-16 ENCOUNTER — Emergency Department (HOSPITAL_COMMUNITY): Payer: Medicare Other

## 2020-05-16 ENCOUNTER — Encounter: Payer: Self-pay | Admitting: Cardiology

## 2020-05-16 ENCOUNTER — Encounter (HOSPITAL_COMMUNITY): Payer: Self-pay

## 2020-05-16 VITALS — BP 88/62 | HR 139 | Ht 68.0 in | Wt 148.0 lb

## 2020-05-16 DIAGNOSIS — I5033 Acute on chronic diastolic (congestive) heart failure: Secondary | ICD-10-CM | POA: Diagnosis not present

## 2020-05-16 DIAGNOSIS — J9811 Atelectasis: Secondary | ICD-10-CM | POA: Diagnosis not present

## 2020-05-16 DIAGNOSIS — Z888 Allergy status to other drugs, medicaments and biological substances status: Secondary | ICD-10-CM | POA: Diagnosis not present

## 2020-05-16 DIAGNOSIS — Z881 Allergy status to other antibiotic agents status: Secondary | ICD-10-CM

## 2020-05-16 DIAGNOSIS — Z91040 Latex allergy status: Secondary | ICD-10-CM

## 2020-05-16 DIAGNOSIS — I2510 Atherosclerotic heart disease of native coronary artery without angina pectoris: Secondary | ICD-10-CM | POA: Diagnosis present

## 2020-05-16 DIAGNOSIS — T502X5A Adverse effect of carbonic-anhydrase inhibitors, benzothiadiazides and other diuretics, initial encounter: Secondary | ICD-10-CM | POA: Diagnosis not present

## 2020-05-16 DIAGNOSIS — J90 Pleural effusion, not elsewhere classified: Secondary | ICD-10-CM | POA: Diagnosis not present

## 2020-05-16 DIAGNOSIS — I509 Heart failure, unspecified: Secondary | ICD-10-CM | POA: Diagnosis not present

## 2020-05-16 DIAGNOSIS — Z882 Allergy status to sulfonamides status: Secondary | ICD-10-CM

## 2020-05-16 DIAGNOSIS — I4891 Unspecified atrial fibrillation: Secondary | ICD-10-CM | POA: Diagnosis not present

## 2020-05-16 DIAGNOSIS — E039 Hypothyroidism, unspecified: Secondary | ICD-10-CM | POA: Diagnosis not present

## 2020-05-16 DIAGNOSIS — Z803 Family history of malignant neoplasm of breast: Secondary | ICD-10-CM

## 2020-05-16 DIAGNOSIS — I480 Paroxysmal atrial fibrillation: Secondary | ICD-10-CM | POA: Diagnosis not present

## 2020-05-16 DIAGNOSIS — I5043 Acute on chronic combined systolic (congestive) and diastolic (congestive) heart failure: Secondary | ICD-10-CM | POA: Diagnosis present

## 2020-05-16 DIAGNOSIS — I5023 Acute on chronic systolic (congestive) heart failure: Secondary | ICD-10-CM | POA: Diagnosis not present

## 2020-05-16 DIAGNOSIS — M1990 Unspecified osteoarthritis, unspecified site: Secondary | ICD-10-CM | POA: Diagnosis not present

## 2020-05-16 DIAGNOSIS — Z79899 Other long term (current) drug therapy: Secondary | ICD-10-CM | POA: Diagnosis not present

## 2020-05-16 DIAGNOSIS — Z90710 Acquired absence of both cervix and uterus: Secondary | ICD-10-CM

## 2020-05-16 DIAGNOSIS — E7849 Other hyperlipidemia: Secondary | ICD-10-CM | POA: Diagnosis not present

## 2020-05-16 DIAGNOSIS — I493 Ventricular premature depolarization: Secondary | ICD-10-CM | POA: Diagnosis not present

## 2020-05-16 DIAGNOSIS — I10 Essential (primary) hypertension: Secondary | ICD-10-CM | POA: Diagnosis not present

## 2020-05-16 DIAGNOSIS — I517 Cardiomegaly: Secondary | ICD-10-CM | POA: Diagnosis not present

## 2020-05-16 DIAGNOSIS — Z950 Presence of cardiac pacemaker: Secondary | ICD-10-CM | POA: Diagnosis present

## 2020-05-16 DIAGNOSIS — I4819 Other persistent atrial fibrillation: Secondary | ICD-10-CM | POA: Diagnosis not present

## 2020-05-16 DIAGNOSIS — Z87891 Personal history of nicotine dependence: Secondary | ICD-10-CM | POA: Diagnosis not present

## 2020-05-16 DIAGNOSIS — J449 Chronic obstructive pulmonary disease, unspecified: Secondary | ICD-10-CM | POA: Diagnosis present

## 2020-05-16 DIAGNOSIS — I2584 Coronary atherosclerosis due to calcified coronary lesion: Secondary | ICD-10-CM

## 2020-05-16 DIAGNOSIS — Y92239 Unspecified place in hospital as the place of occurrence of the external cause: Secondary | ICD-10-CM | POA: Diagnosis present

## 2020-05-16 DIAGNOSIS — I110 Hypertensive heart disease with heart failure: Secondary | ICD-10-CM | POA: Diagnosis present

## 2020-05-16 DIAGNOSIS — I5022 Chronic systolic (congestive) heart failure: Secondary | ICD-10-CM | POA: Diagnosis not present

## 2020-05-16 DIAGNOSIS — I420 Dilated cardiomyopathy: Secondary | ICD-10-CM | POA: Diagnosis present

## 2020-05-16 DIAGNOSIS — E876 Hypokalemia: Secondary | ICD-10-CM | POA: Diagnosis not present

## 2020-05-16 DIAGNOSIS — Z7901 Long term (current) use of anticoagulants: Secondary | ICD-10-CM

## 2020-05-16 DIAGNOSIS — Z95810 Presence of automatic (implantable) cardiac defibrillator: Secondary | ICD-10-CM | POA: Diagnosis not present

## 2020-05-16 DIAGNOSIS — I714 Abdominal aortic aneurysm, without rupture: Secondary | ICD-10-CM | POA: Diagnosis present

## 2020-05-16 DIAGNOSIS — I313 Pericardial effusion (noninflammatory): Secondary | ICD-10-CM | POA: Diagnosis present

## 2020-05-16 DIAGNOSIS — I447 Left bundle-branch block, unspecified: Secondary | ICD-10-CM | POA: Diagnosis present

## 2020-05-16 DIAGNOSIS — I081 Rheumatic disorders of both mitral and tricuspid valves: Secondary | ICD-10-CM | POA: Diagnosis present

## 2020-05-16 DIAGNOSIS — K219 Gastro-esophageal reflux disease without esophagitis: Secondary | ICD-10-CM | POA: Diagnosis present

## 2020-05-16 DIAGNOSIS — Z85820 Personal history of malignant melanoma of skin: Secondary | ICD-10-CM

## 2020-05-16 DIAGNOSIS — Z20822 Contact with and (suspected) exposure to covid-19: Secondary | ICD-10-CM | POA: Diagnosis not present

## 2020-05-16 DIAGNOSIS — J811 Chronic pulmonary edema: Secondary | ICD-10-CM | POA: Diagnosis not present

## 2020-05-16 DIAGNOSIS — Z823 Family history of stroke: Secondary | ICD-10-CM

## 2020-05-16 DIAGNOSIS — Z800 Family history of malignant neoplasm of digestive organs: Secondary | ICD-10-CM

## 2020-05-16 DIAGNOSIS — R0602 Shortness of breath: Secondary | ICD-10-CM | POA: Diagnosis not present

## 2020-05-16 LAB — CBC
HCT: 34.7 % — ABNORMAL LOW (ref 36.0–46.0)
Hemoglobin: 10.8 g/dL — ABNORMAL LOW (ref 12.0–15.0)
MCH: 29 pg (ref 26.0–34.0)
MCHC: 31.1 g/dL (ref 30.0–36.0)
MCV: 93 fL (ref 80.0–100.0)
Platelets: 216 10*3/uL (ref 150–400)
RBC: 3.73 MIL/uL — ABNORMAL LOW (ref 3.87–5.11)
RDW: 13.2 % (ref 11.5–15.5)
WBC: 7.4 10*3/uL (ref 4.0–10.5)
nRBC: 0 % (ref 0.0–0.2)

## 2020-05-16 LAB — RESP PANEL BY RT-PCR (FLU A&B, COVID) ARPGX2
Influenza A by PCR: NEGATIVE
Influenza B by PCR: NEGATIVE
SARS Coronavirus 2 by RT PCR: NEGATIVE

## 2020-05-16 LAB — BASIC METABOLIC PANEL
Anion gap: 8 (ref 5–15)
BUN: 32 mg/dL — ABNORMAL HIGH (ref 8–23)
CO2: 21 mmol/L — ABNORMAL LOW (ref 22–32)
Calcium: 8.7 mg/dL — ABNORMAL LOW (ref 8.9–10.3)
Chloride: 107 mmol/L (ref 98–111)
Creatinine, Ser: 0.93 mg/dL (ref 0.44–1.00)
GFR, Estimated: >60 mL/min (ref >60)
Glucose, Bld: 96 mg/dL (ref 70–99)
Potassium: 3.7 mmol/L (ref 3.5–5.1)
Sodium: 136 mmol/L (ref 135–145)

## 2020-05-16 LAB — BRAIN NATRIURETIC PEPTIDE: B Natriuretic Peptide: 1299.8 pg/mL — ABNORMAL HIGH (ref 0.0–100.0)

## 2020-05-16 MED ORDER — HEPARIN SODIUM (PORCINE) 5000 UNIT/ML IJ SOLN: 5000.0000 [IU] | Freq: Three times a day (TID) | INTRAMUSCULAR | Status: DC

## 2020-05-16 MED ORDER — FUROSEMIDE 10 MG/ML IJ SOLN
40.0000 mg | Freq: Once | INTRAMUSCULAR | Status: AC
  Administered 2020-05-16: 40 mg via INTRAVENOUS
  Filled 2020-05-16: qty 4

## 2020-05-16 MED ORDER — SODIUM CHLORIDE 0.9 % IV SOLN
250.0000 mL | INTRAVENOUS | Status: DC | PRN
  Administered 2020-05-18: 250 mL via INTRAVENOUS

## 2020-05-16 MED ORDER — RIVAROXABAN 15 MG PO TABS
15.0000 mg | ORAL_TABLET | Freq: Every day | ORAL | Status: DC
  Administered 2020-05-16 – 2020-05-19 (×4): 15 mg via ORAL
  Filled 2020-05-16 (×4): qty 1

## 2020-05-16 MED ORDER — BUPROPION HCL ER (XL) 150 MG PO TB24
150.0000 mg | ORAL_TABLET | Freq: Every day | ORAL | Status: DC
  Administered 2020-05-18 – 2020-05-20 (×3): 150 mg via ORAL
  Filled 2020-05-16 (×3): qty 1

## 2020-05-16 MED ORDER — DILTIAZEM HCL-DEXTROSE 125-5 MG/125ML-% IV SOLN (PREMIX)
5.0000 mg/h | INTRAVENOUS | Status: DC
  Administered 2020-05-16: 5 mg/h via INTRAVENOUS
  Filled 2020-05-16 (×2): qty 125

## 2020-05-16 MED ORDER — ONDANSETRON HCL 4 MG/2ML IJ SOLN: 4.0000 mg | Freq: Four times a day (QID) | INTRAMUSCULAR | Status: DC | PRN

## 2020-05-16 MED ORDER — LEVOTHYROXINE SODIUM 100 MCG PO TABS
100.0000 ug | ORAL_TABLET | Freq: Every day | ORAL | Status: DC
  Administered 2020-05-17 – 2020-05-20 (×4): 100 ug via ORAL
  Filled 2020-05-16 (×4): qty 1

## 2020-05-16 MED ORDER — ACETAMINOPHEN 500 MG PO TABS: 1000.0000 mg | ORAL_TABLET | Freq: Three times a day (TID) | ORAL | Status: DC | PRN

## 2020-05-16 MED ORDER — ACETAMINOPHEN 325 MG PO TABS
650.0000 mg | ORAL_TABLET | ORAL | Status: DC | PRN
  Administered 2020-05-16 – 2020-05-17 (×2): 650 mg via ORAL
  Filled 2020-05-16 (×2): qty 2

## 2020-05-16 MED ORDER — SODIUM CHLORIDE 0.9% FLUSH
3.0000 mL | Freq: Two times a day (BID) | INTRAVENOUS | Status: DC
  Administered 2020-05-16 – 2020-05-20 (×7): 3 mL via INTRAVENOUS

## 2020-05-16 MED ORDER — FUROSEMIDE 10 MG/ML IJ SOLN: 40.0000 mg | Freq: Two times a day (BID) | INTRAMUSCULAR | Status: DC

## 2020-05-16 MED ORDER — SODIUM CHLORIDE 0.9% FLUSH: 3.0000 mL | INTRAVENOUS | Status: DC | PRN

## 2020-05-16 NOTE — ED Triage Notes (Signed)
Pt sent here by cardiologist for admission for CHF exacerbation, pt has significant weight gain over the past week and lower extremity edema. Pt c.o feeling weak and SOB. resp e.u at this time.

## 2020-05-16 NOTE — H&P (Signed)
   Please see Dr. Doug Sou office note from today's date to serve as the patients H&P.   In short, she was seen today for follow up and was found to have marked LE edema with weeping, SOB and profound fluid volume overload. EKG with AF with RVR (failed recent DCCV). Reported compliance with Xarelto. She was admitted for IV Lasix and will be placed on IV Diltiazem infusion for rate control. Dr. [name] spoke with Dr. Rayann Heman who will be rounding for EP service this weekend about the potential for Tikosyn therapy. We will obtain labs and diuresis overnight with EP consultation tomorrow.   Kathyrn Drown NP-C [place] Pager: [phone]'

## 2020-05-16 NOTE — ED Provider Notes (Signed)
[facility] EMERGENCY DEPARTMENT Provider Note   CSN: [id number] Arrival date & time: 05/16/20  1759     History Chief Complaint  Patient presents with   Congestive Heart Failure   Shortness of Breath    [name] is a 87 y.o. female w PMHx CHF, a fib on xarelto, HTN, AAA, presenting to the emergency department from cardiology office for admission.  Patient has been feeling increased shortness of breath and generalized fatigue with new significant lower extremity edema that is weeping.  States she recently underwent DCCV on 05/06/2020 which initially was successful, however A. fib returned.  Has remained compliant on Xarelto.  Per review of cardiologist note today in clinic, she is found to be in A. fib with RVR this is felt to be likely contributing to her CHF exacerbation.  He sent her to the ED for admission to cardiology service for IV diuresis and rate control, plans for EP consult and echo.  This is confirmed by cardiology NP Stefani Dama over the phone.   Patient states she has been doubling up on her Lasix daily taking 40 twice daily without any relief.  Endorses about 23 pound weight gain.  Denies chest pain.  Denies fever or cough.  The history is provided by the patient and medical records.       Past Medical History:  Diagnosis Date   AAA (abdominal aortic aneurysm) (HCC)    2.8 cm 05/2018; 5 year [place] per ACR guidelines   Allergy    all year    Anxiety disorder    Arthritis    Barrett's esophagus 04/2008   Benign neoplasm of colon    Blood transfusion without reported diagnosis    long ago per pt   Cataract    removed both eyes   CHF (congestive heart failure) ([place])    Chronic airway obstruction, not elsewhere classified    Complication of anesthesia    slow to wake up   Depressive disorder, not elsewhere classified    Diverticulitis    Dysthymic disorder    Erosive gastritis 08/2004   GERD (gastroesophageal reflux  disease)    Hair thinning    HTN (hypertension)    pt denies    Hyperplastic polyps of stomach 06/2002   Hypothyroidism    ICD (implantable cardiac defibrillator) in place    BiV/ICD; s/p removal of ICD with insertion of BiV PPM 04/26/12   Incisional hernia    Insomnia 08/20/2015   Kyphosis 08/20/2015   LBBB (left bundle branch block)    Melanoma (HCC)    right arm   Mitral regurgitation    Nonischemic cardiomyopathy ([place])    EF initially 20%; Last measurement up to 50% per echo in August of 2012   Osteopenia    Other and unspecified hyperlipidemia    Other and unspecified hyperlipidemia    Pacemaker 04/26/2012   PAF (paroxysmal atrial fibrillation) ([place])    11/2016   Pain in joint, lower leg    Palpitations    Pneumonia    Restless legs syndrome (RLS)    Synovial cyst of popliteal space    Unspecified chronic bronchitis (HCC)    Unspecified nasal polyp    Unspecified sinusitis (chronic)     Patient Active Problem List   Diagnosis Date Noted   AF (atrial fibrillation) ([place]) 05/16/2020   Persistent atrial fibrillation (HCC)    Acute congestive heart failure ([place]) 04/28/2020   Incisional hernia 09/07/2018   Coronary atherosclerosis  due to calcified coronary lesion of native artery 12/09/2017   Anticoagulated 11/07/2015   Paroxysmal atrial fibrillation ([place]) 11/07/2015   Lumbago 08/20/2015   Kyphosis 08/20/2015   Insomnia 08/20/2015   Aortic atherosclerosis ([place]) 08/20/2015   Abdominal bruit 08/20/2015   Aortic ectasia ([place]) 08/20/2015   AAA (abdominal aortic aneurysm) without rupture ([place]) 08/20/2015   B12 deficiency 05/21/2015   HTN (hypertension) 11/19/2014   Cardiomyopathy, dilated, nonischemic ([place]) 07/18/2014   Biventricular cardiac pacemaker in situ 04/24/2014   Chronic cough 04/17/2014   Aneurysm of iliac artery ([place]) 04/17/2014   Incisional hernia, without obstruction or gangrene 11/21/2013   Frequency of urination  11/21/2013   Malaise and fatigue 05/08/2013   Raynaud disease 05/08/2013   Moderate malnutrition ([place]) 12/24/2012   Hypothyroidism    Hair thinning    Anxiety disorder    Hyperlipidemia    Sinusitis, chronic    Chronic bronchitis (HCC)    Depression    Restless legs syndrome (RLS)    PVC (premature ventricular contraction) 06/08/2011   GERD 35/57/3220   Chronic systolic heart failure ([place]) 12/12/2008   BARRETT'S ESOPHAGUS 05/29/2008   Gastritis and gastroduodenitis 05/29/2008    Past Surgical History:  Procedure Laterality Date   ABDOMINAL HYSTERECTOMY  1076   endometriosis   APPENDECTOMY     BI-VENTRICULAR PACEMAKER INSERTION N/A 04/26/2012   Procedure: BI-VENTRICULAR PACEMAKER INSERTION (CRT-P);  Surgeon: Evans Lance, MD;  Location: [facility] CATH LAB;  Service: Cardiovascular;  Laterality: N/A;   BIV PACEMAKER GENERATOR CHANGEOUT N/A 01/26/2019   Procedure: BIV PACEMAKER GENERATOR CHANGEOUT;  Surgeon: Evans Lance, MD;  Location: [facility] CV LAB;  Service: Cardiovascular;  Laterality: N/A;   CARDIOVERSION N/A 05/06/2020   Procedure: CARDIOVERSION;  Surgeon: Pixie Casino, MD;  Location: [facility] ENDOSCOPY;  Service: Cardiovascular;  Laterality: N/A;   COLONOSCOPY     defibrillator insertion     s/p removal of previously implanted BiV ICD and insertion of a new BiV pacemaker on 04/26/12   excise mole of lip  2001   Dr. Larena Sox   excision of wen  1984   Dr. Parks Ranger   FEMORAL HERNIA REPAIR  12/25/2012   Dr Dalbert Batman   FEMORAL HERNIA REPAIR  01/04/2013   Recurrent - Dr. Nash Mantis HERNIA REPAIR N/A 10/01/2013   Procedure: LAPAROSCOPIC INCISIONAL HERNIA;  Surgeon: Gayland Curry, MD;  Location: [facility] ORS;  Service: General;  Laterality: N/A;   [place] N/A 09/07/2018   Procedure: LAPAROSCOPIC ASSISTED REPAIR OF INCISIONAL HERNIA;  Surgeon: Greer Pickerel, MD;  Location: [facility];  Service: General;  Laterality: N/A;   INGUINAL HERNIA  REPAIR Right 09/26/2012   Procedure: RIGHT INGUINAL HERNIA REPAIR WITH MESH;  Surgeon: Odis Hollingshead, MD;  Location: [facility];  Service: General;  Laterality: Right;   INGUINAL HERNIA REPAIR Right 12/25/2012   Procedure: explor right groin, small bowel rescection, tissue repair right femoral hernia;  Surgeon: Adin Hector, MD;  Location: [facility];  Service: General;  Laterality: Right;   INSERTION OF MESH Right 09/26/2012   Procedure: INSERTION OF MESH;  Surgeon: Odis Hollingshead, MD;  Location: [facility];  Service: General;  Laterality: Right;   INSERTION OF MESH N/A 10/01/2013   Procedure: INSERTION OF MESH;  Surgeon: Gayland Curry, MD;  Location: [facility];  Service: General;  Laterality: N/A;   LAPAROSCOPY N/A 01/04/2013   Procedure: Diagnostic Laparoscopy, exploratory laparotomy with small bowel resection, closure of right femoral hernia repair;  Surgeon: Madilyn Hook, DO;  Location: [facility];  Service: General;  Laterality: N/A;   Left arm surgery     POLYPECTOMY     RIGHT BREAST LUMPECTOMY  1988   Dr. Marylene Buerger   right knee surgery     arthroscopy: Dr. Shellia Carwin   TONSILLECTOMY     UPPER GASTROINTESTINAL ENDOSCOPY       OB History   No obstetric history on file.     Family History  Problem Relation Age of Onset   Stroke Father    Heart disease Other        maternal side   Colon cancer Paternal Uncle    Breast cancer Maternal Aunt    Colon cancer Cousin    Diabetes Neg Hx    Colon polyps Neg Hx    Rectal cancer Neg Hx    Stomach cancer Neg Hx     Social History   Tobacco Use   Smoking status: Former Smoker    Packs/day: 1.00    Years: 15.00    Pack years: 15.00    Types: Cigarettes    Quit date: 12/22/1993    Years since quitting: 26.4   Smokeless tobacco: Never Used   Tobacco comment: Does'nt reall year quit   Vaping Use   Vaping Use: Never used  Substance Use Topics   Alcohol use: No    Alcohol/week: 0.0 standard drinks   Drug use: No     Home Medications Prior to Admission medications   Medication Sig Start Date End Date Taking? Authorizing Provider  acetaminophen (TYLENOL) 500 MG tablet Take 1,000 mg by mouth every 8 (eight) hours as needed for moderate pain or mild pain.    [provider]  Ascorbic Acid (VITAMIN C) 1000 MG tablet Take 1,000 mg by mouth at bedtime. Ester C    [provider]  Biotin 5000 MCG TABS Take 5,000 mcg by mouth daily.    [provider]  buPROPion (WELLBUTRIN XL) 150 MG 24 hr tablet TAKE 1 TABLET BY MOUTH EVERY DAY Patient taking differently: Take 150 mg by mouth daily. 03/12/20   Reed, Tiffany L, DO  Calcium Carbonate-Vitamin D (CALCIUM + D PO) Take 2 tablets by mouth daily. 1000 units Vitamin D 1200 mg calcium    [provider]  Cyanocobalamin (VITAMIN B 12) 500 MCG TABS Take 1,000 mcg by mouth daily.     [provider]  estradiol (ESTRACE) 0.1 MG/GM vaginal cream Place 1 g vaginally 2 (two) times a week. 06/13/18   [provider]  FERREX 150 150 MG capsule TAKE 1 CAPSULE BY MOUTH EVERY DAY Patient taking differently: Take 150 mg by mouth daily. 03/21/19   Reed, Tiffany L, DO  fexofenadine-pseudoephedrine (ALLEGRA-D) 60-120 MG 12 hr tablet Take 1 tablet by mouth daily as needed (Allergies).    [provider]  furosemide (LASIX) 20 MG tablet Take 1 tablet (20 mg total) by mouth daily as needed for fluid or edema. 04/23/20   [name], Peter M, MD  metoprolol tartrate (LOPRESSOR) 25 MG tablet Take metoprolol 37.5 mg in AM and metoprolol 25 mg in evening. 05/01/20   Evans Lance, MD  Polyethyl Glycol-Propyl Glycol (SYSTANE) 0.4-0.3 % SOLN Place 1 drop into both eyes daily.    [provider]  RABEprazole (ACIPHEX) 20 MG tablet Take one tablet by mouth once daily for stomach Patient taking differently: Take 20 mg by mouth daily. 08/28/19   Reed, Tiffany L, DO  Rivaroxaban Alveda Reasons)  15 MG TABS tablet TAKE 1 TABLET DAILY WITH    SUPPER (DOSE REDUCED) Patient taking differently: Take 15 mg by mouth daily with supper. 01/29/20   [name], Peter M, MD  SYNTHROID 100 MCG tablet TAKE 1 TABLET BY MOUTH DAILY BEFORE BREAKFAST Patient taking differently: Take 100 mcg by mouth daily before breakfast. 12/31/19   Reed, Tiffany L, DO  trandolapril (MAVIK) 2 MG tablet TAKE 1 TABLET BY MOUTH TWICE A DAY Patient taking differently: Take 2 mg by mouth 2 (two) times daily. 07/13/19   [name], Peter M, MD    Allergies    Hydrocodone, Cephalexin, Ciprofloxacin, Doxycycline, Escitalopram, Levofloxacin, Moxifloxacin, Ofloxacin, Sertraline, Sulfamethoxazole, Tape, Latex, and Neomycin  Review of Systems   Review of Systems  Constitutional: Positive for fatigue.  Respiratory: Positive for shortness of breath.   Cardiovascular: Positive for leg swelling.  All other systems reviewed and are negative.   Physical Exam Updated Vital Signs BP 99/81    Pulse (!) 120    Temp 97.8 F (36.6 C) (Oral)    Resp 18    SpO2 95%   Physical Exam Vitals and nursing note reviewed.  Constitutional:      General: She is not in acute distress.    Appearance: She is well-developed.  HENT:     Head: Normocephalic and atraumatic.  Eyes:     Conjunctiva/sclera: Conjunctivae normal.  Cardiovascular:     Rate and Rhythm: Tachycardia present. Rhythm irregular.  Pulmonary:     Effort: Pulmonary effort is normal.     Breath sounds: Decreased breath sounds (b/l diminished) present.  Abdominal:     General: Bowel sounds are normal.     Palpations: Abdomen is soft.     Tenderness: There is no abdominal tenderness.  Musculoskeletal:     Comments: Significant bilateral lower extremity edema with serous weeping noted  Skin:    General: Skin is warm.  Neurological:     Mental Status: She is alert.  Psychiatric:        Behavior: Behavior normal.     ED Results / Procedures / Treatments   Labs (all labs ordered are listed, but only abnormal results are  displayed) Labs Reviewed  BASIC METABOLIC PANEL - Abnormal; Notable for the following components:      Result Value   CO2 21 (*)    BUN 32 (*)    Calcium 8.7 (*)    All other components within normal limits  CBC - Abnormal; Notable for the following components:   RBC 3.73 (*)    Hemoglobin 10.8 (*)    HCT 34.7 (*)    All other components within normal limits  BRAIN NATRIURETIC PEPTIDE - Abnormal; Notable for the following components:   B Natriuretic Peptide 1,299.8 (*)    All other components within normal limits  RESP PANEL BY RT-PCR (FLU A&B, COVID) ARPGX2  BASIC METABOLIC PANEL  TSH    EKG None  Radiology DG Chest 2 View  Result Date: 05/16/2020 CLINICAL DATA:  Shortness of breath.  CHF exacerbation. EXAM: CHEST - 2 VIEW COMPARISON:  Radiograph 03/30/2019 FINDINGS: Multi lead left-sided pacemaker in place. Lower lung volumes from prior exam. Mild cardiomegaly. Small bilateral pleural effusions. Associated compressive atelectasis. Mild interstitial thickening consistent with pulmonary edema. No pneumothorax or confluent airspace disease. Surgical clips in the left axilla. IMPRESSION: Congestive heart failure with mild pulmonary edema and small bilateral pleural effusions. Electronically Signed   By: Keith Rake M.D.   On: 05/16/2020 19:18  Procedures .Critical Care Performed by: Wyn N[place], [name] N, PA-C Authorized by: Triton Heidrich, [name] N, PA-C   Critical care provider statement:    Critical care time (minutes):  45   Critical care time was exclusive of:  Separately billable procedures and treating other patients and teaching time   Critical care was necessary to treat or prevent imminent or life-threatening deterioration of the following conditions: a fib w RVR.   Critical care was time spent personally by me on the following activities:  Discussions with consultants, evaluation of patient's response to treatment, examination of patient, ordering and performing  treatments and interventions, ordering and review of laboratory studies, ordering and review of radiographic studies, pulse oximetry, re-evaluation of patient's condition, obtaining history from patient or surrogate and review of old charts   I assumed direction of critical care for this patient from another provider in my specialty: no       Medications Ordered in ED Medications  buPROPion (WELLBUTRIN XL) 24 hr tablet 150 mg (has no administration in time range)  levothyroxine (SYNTHROID) tablet 100 mcg (has no administration in time range)  Rivaroxaban (XARELTO) tablet 15 mg (has no administration in time range)  sodium chloride flush (NS) 0.9 % injection 3 mL (has no administration in time range)  sodium chloride flush (NS) 0.9 % injection 3 mL (has no administration in time range)  0.9 %  sodium chloride infusion (has no administration in time range)  acetaminophen (TYLENOL) tablet 650 mg (has no administration in time range)  ondansetron (ZOFRAN) injection 4 mg (has no administration in time range)  furosemide (LASIX) injection 40 mg (has no administration in time range)  diltiazem (CARDIZEM) 125 mg in dextrose 5% 125 mL (1 mg/mL) infusion (5 mg/hr Intravenous New Bag/Given 05/16/20 1954)  furosemide (LASIX) injection 40 mg (40 mg Intravenous Given 05/16/20 1956)    ED Course  I have reviewed the triage vital signs and the nursing notes.  Pertinent labs & imaging results that were available during my care of the patient were reviewed by me and considered in my medical decision making (see chart for details).    MDM Rules/Calculators/A&P                          Patient presenting to the ED from cardiology office with plans for admission for CHF exacerbation thought to be attributed to A. fib with RVR.  Failed cardioversion outpatient.  And compliant on her Xarelto.  On examination, she is clinically fluid overloaded with significant lower extremity edema that is weeping serous fluid.   No respiratory distress.  Chest x-ray with mild pleural effusions and edema.  BNP is markedly elevated to about 1300.  Covid swab is sent and negative.  Cardiology NP Kathyrn Drown ordered diltiazem infusion for A. fib rate control.  She is aware of patient's soft blood pressures.  Patient is also given Lasix for CHF.  Will be admitted to the cardiology service with plans for EP consultation in the morning.  Patient is in agreement with plan.  Final Clinical Impression(s) / ED Diagnoses Final diagnoses:  Atrial fibrillation with RVR ([place])  Acute on chronic congestive heart failure, unspecified heart failure type [facility])    Rx / DC Orders ED Discharge Orders    None       Cloie Wooden, [name] N, PA-C 05/16/20 2100    Drenda Freeze, MD 05/19/20 1752

## 2020-05-17 ENCOUNTER — Inpatient Hospital Stay (HOSPITAL_COMMUNITY): Payer: Medicare Other

## 2020-05-17 DIAGNOSIS — I5033 Acute on chronic diastolic (congestive) heart failure: Secondary | ICD-10-CM | POA: Diagnosis not present

## 2020-05-17 DIAGNOSIS — I420 Dilated cardiomyopathy: Secondary | ICD-10-CM

## 2020-05-17 DIAGNOSIS — I5022 Chronic systolic (congestive) heart failure: Secondary | ICD-10-CM

## 2020-05-17 LAB — ECHOCARDIOGRAM COMPLETE
Area-P 1/2: 7.16 cm2
Height: 68 in
S' Lateral: 4.4 cm
Weight: 2338.64 oz

## 2020-05-17 LAB — BASIC METABOLIC PANEL
Anion gap: 10 (ref 5–15)
BUN: 32 mg/dL — ABNORMAL HIGH (ref 8–23)
CO2: 18 mmol/L — ABNORMAL LOW (ref 22–32)
Calcium: 8.9 mg/dL (ref 8.9–10.3)
Chloride: 109 mmol/L (ref 98–111)
Creatinine, Ser: 0.97 mg/dL (ref 0.44–1.00)
GFR, Estimated: 57 mL/min — ABNORMAL LOW (ref >60)
Glucose, Bld: 100 mg/dL — ABNORMAL HIGH (ref 70–99)
Potassium: 3.5 mmol/L (ref 3.5–5.1)
Sodium: 137 mmol/L (ref 135–145)

## 2020-05-17 LAB — TSH: TSH: 2.784 u[IU]/mL (ref 0.350–4.500)

## 2020-05-17 LAB — MAGNESIUM: Magnesium: 1.9 mg/dL (ref 1.7–2.4)

## 2020-05-17 MED ORDER — POTASSIUM CHLORIDE CRYS ER 20 MEQ PO TBCR
40.0000 meq | EXTENDED_RELEASE_TABLET | Freq: Every day | ORAL | Status: DC
  Administered 2020-05-17 – 2020-05-20 (×4): 40 meq via ORAL
  Filled 2020-05-17: qty 2
  Filled 2020-05-17: qty 4
  Filled 2020-05-17 (×2): qty 2

## 2020-05-17 MED ORDER — AMIODARONE HCL IN DEXTROSE 360-4.14 MG/200ML-% IV SOLN
30.0000 mg/h | INTRAVENOUS | Status: DC
  Administered 2020-05-18 (×2): 30 mg/h via INTRAVENOUS
  Filled 2020-05-17 (×3): qty 200

## 2020-05-17 MED ORDER — AMIODARONE HCL IN DEXTROSE 360-4.14 MG/200ML-% IV SOLN
60.0000 mg/h | INTRAVENOUS | Status: AC
  Administered 2020-05-17 (×2): 60 mg/h via INTRAVENOUS
  Filled 2020-05-17 (×2): qty 200

## 2020-05-17 MED ORDER — AMIODARONE HCL IN DEXTROSE 360-4.14 MG/200ML-% IV SOLN: 30.0000 mg/h | INTRAVENOUS | Status: DC

## 2020-05-17 MED ORDER — DIGOXIN 0.25 MG/ML IJ SOLN
0.5000 mg | Freq: Once | INTRAMUSCULAR | Status: AC
  Administered 2020-05-17: 0.5 mg via INTRAVENOUS
  Filled 2020-05-17: qty 2

## 2020-05-17 MED ORDER — FUROSEMIDE 10 MG/ML IJ SOLN
40.0000 mg | Freq: Two times a day (BID) | INTRAMUSCULAR | Status: DC
  Administered 2020-05-17 (×2): 40 mg via INTRAVENOUS
  Filled 2020-05-17 (×2): qty 4

## 2020-05-17 MED ORDER — AMIODARONE HCL IN DEXTROSE 360-4.14 MG/200ML-% IV SOLN
60.0000 mg/h | INTRAVENOUS | Status: DC
  Filled 2020-05-17: qty 200

## 2020-05-17 MED ORDER — DIGOXIN 0.25 MG/ML IJ SOLN
0.2500 mg | Freq: Once | INTRAMUSCULAR | Status: DC
  Filled 2020-05-17: qty 1

## 2020-05-17 NOTE — Progress Notes (Addendum)
Cardiologist notified that the pt does not wish to start digoxin. Per pt she took it before in the past & does not wish to take it again. Please f/u w/ the pt. Hoover Brunette, RN     MD notified again that pt  says that as long as she doesn't have to take tikosyn she's ok with taking the digoxin. she can't remember what issue she had with the digoxin anyway

## 2020-05-17 NOTE — Progress Notes (Addendum)
cardilogist on call notified of  pt is on a diltz drip at 7.5 was just changed from 10, hr is ranging from 40s-70s. pacer not capturing per monitor. ekg & vs stable pt asymptomatic. Pt also requiring 4-5 L of [place] O2 just to maintain her spo2 of 91-92% from RA at baseline. diltz gtt to be titrated down & new order for 40mg  of IV lasix for fvo. Pt educated & update. Hoover Brunette, RN

## 2020-05-17 NOTE — Progress Notes (Signed)
  Echocardiogram 2D Echocardiogram has been performed.  [name] 05/17/2020, 10:37 AM

## 2020-05-17 NOTE — Progress Notes (Signed)
  Amiodarone Drug - Drug Interaction Consult Note  Recommendations: -Would reduce digoxin dose given age and CrCl in half - MD paged  Amiodarone is metabolized by the cytochrome P450 system and therefore has the potential to cause many drug interactions. Amiodarone has an average plasma half-life of 50 days (range 20 to 100 days).   There is potential for drug interactions to occur several weeks or months after stopping treatment and the onset of drug interactions may be slow after initiating amiodarone.   []  Statins: Increased risk of myopathy. Simvastatin- restrict dose to 20mg  daily. Other statins: counsel patients to report any muscle pain or weakness immediately.  []  Anticoagulants: Amiodarone can increase anticoagulant effect. Consider warfarin dose reduction. Patients should be monitored closely and the dose of anticoagulant altered accordingly, remembering that amiodarone levels take several weeks to stabilize.  []  Antiepileptics: Amiodarone can increase plasma concentration of phenytoin, the dose should be reduced. Note that small changes in phenytoin dose can result in large changes in levels. Monitor patient and counsel on signs of toxicity.  []  Beta blockers: increased risk of bradycardia, AV block and myocardial depression. Sotalol - avoid concomitant use.  []   Calcium channel blockers (diltiazem and verapamil): increased risk of bradycardia, AV block and myocardial depression.  []   Cyclosporine: Amiodarone increases levels of cyclosporine. Reduced dose of cyclosporine is recommended.  [x]  Digoxin dose should be halved when amiodarone is started.  []  Diuretics: increased risk of cardiotoxicity if hypokalemia occurs.  []  Oral hypoglycemic agents (glyburide, glipizide, glimepiride): increased risk of hypoglycemia. Patient's glucose levels should be monitored closely when initiating amiodarone therapy.   []  Drugs that prolong the QT interval:  Torsades de pointes risk may be  increased with concurrent use - avoid if possible.  Monitor QTc, also keep magnesium/potassium WNL if concurrent therapy can't be avoided. Marland Kitchen Antibiotics: e.g. fluoroquinolones, erythromycin. . Antiarrhythmics: e.g. quinidine, procainamide, disopyramide, sotalol. . Antipsychotics: e.g. phenothiazines, haloperidol.  . Lithium, tricyclic antidepressants, and methadone. Thank Concha Pyo  05/17/2020 4:09 PM

## 2020-05-17 NOTE — Progress Notes (Signed)
Cardiology Progress Note  Patient ID: SHENA VINLUAN MRN: [id number] DOB: [birth year] Date of Encounter: 05/17/2020  Primary Cardiologist: No primary care provider on file.  Subjective   Chief Complaint: Shortness of breath.  HPI: Admitted overnight with A. fib with RVR and decompensated heart failure.  Rate controlled on diltiazem.  Still volume overloaded.  Condition appears to be improving with Lasix but not back to baseline.  ROS:  All other ROS reviewed and negative. Pertinent positives noted in the HPI.     Inpatient Medications  Scheduled Meds: . buPROPion  150 mg Oral Daily  . furosemide  40 mg Intravenous BID  . levothyroxine  100 mcg Oral QAC breakfast  . potassium chloride  40 mEq Oral Daily  . Rivaroxaban  15 mg Oral Q supper  . sodium chloride flush  3 mL Intravenous Q12H   Continuous Infusions: . sodium chloride    . diltiazem (CARDIZEM) infusion 5 mg/hr (05/17/20 0513)   PRN Meds: sodium chloride, acetaminophen, ondansetron (ZOFRAN) IV, sodium chloride flush   Vital Signs   Vitals:   05/17/20 0030 05/17/20 0359 05/17/20 0400 05/17/20 0738  BP: 115/74 106/77 106/77   Pulse: 77 71 79 88  Resp:  19  20  Temp:    97.6 F (36.4 C)  TempSrc:    Oral  SpO2: 90% 91% 93%   Weight:  66.3 kg      Intake/Output Summary (Last 24 hours) at 05/17/2020 0821 Last data filed at 05/17/2020 0513 Gross per 24 hour  Intake --  Output 1200 ml  Net -1200 ml   Last 3 Weights 05/17/2020 05/16/2020 05/06/2020  Weight (lbs) 146 lb 2.6 oz 148 lb 142 lb  Weight (kg) 66.3 kg 67.132 kg 64.411 kg      Telemetry  Overnight telemetry shows atrial fibrillation with heart rate in the 90s, which I personally reviewed.   ECG  The most recent ECG shows atrial fibrillation with V pacing intermittently, which I personally reviewed.   Physical Exam   Vitals:   05/17/20 0030 05/17/20 0359 05/17/20 0400 05/17/20 0738  BP: 115/74 106/77 106/77   Pulse: 77 71 79 88  Resp:  19  20   Temp:    97.6 F (36.4 C)  TempSrc:    Oral  SpO2: 90% 91% 93%   Weight:  66.3 kg       Intake/Output Summary (Last 24 hours) at 05/17/2020 0821 Last data filed at 05/17/2020 0513 Gross per 24 hour  Intake --  Output 1200 ml  Net -1200 ml    Last 3 Weights 05/17/2020 05/16/2020 05/06/2020  Weight (lbs) 146 lb 2.6 oz 148 lb 142 lb  Weight (kg) 66.3 kg 67.132 kg 64.411 kg    Body mass index is 22.22 kg/m.   General: Well nourished, well developed, in no acute distress Head: Atraumatic, normal size  Eyes: PEERLA, EOMI  Neck: Supple, JVD 10 to 12 cm of water Endocrine: No thryomegaly Cardiac: Normal S1, S2; irregular rhythm, no murmurs rubs or gallops Lungs: Crackles at the lung bases Abd: Soft, nontender, no hepatomegaly  Ext: 2+ pitting edema Musculoskeletal: No deformities, BUE and BLE strength normal and equal Skin: Warm and dry, no rashes   Neuro: Alert and oriented to person, place, time, and situation, CNII-XII grossly intact, no focal deficits  Psych: Normal mood and affect   Labs  High Sensitivity Troponin:  No results for input(s): TROPONINIHS in the last 720 hours.   Cardiac EnzymesNo results for input(s):  TROPONINI in the last 168 hours. No results for input(s): TROPIPOC in the last 168 hours.  Chemistry Recent Labs  Lab 05/16/20 1819 05/17/20 0259  NA 136 137  K 3.7 3.5  CL 107 109  CO2 21* 18*  GLUCOSE 96 100*  BUN 32* 32*  CREATININE 0.93 0.97  CALCIUM 8.7* 8.9  GFRNONAA >60 57*  ANIONGAP 8 10    Hematology Recent Labs  Lab 05/16/20 1819  WBC 7.4  RBC 3.73*  HGB 10.8*  HCT 34.7*  MCV 93.0  MCH 29.0  MCHC 31.1  RDW 13.2  PLT 216   BNP Recent Labs  Lab 05/16/20 1819  BNP 1,299.8*    DDimer No results for input(s): DDIMER in the last 168 hours.   Radiology  DG Chest 2 View  Result Date: 05/16/2020 CLINICAL DATA:  Shortness of breath.  CHF exacerbation. EXAM: CHEST - 2 VIEW COMPARISON:  Radiograph 03/30/2019 FINDINGS: Multi lead  left-sided pacemaker in place. Lower lung volumes from prior exam. Mild cardiomegaly. Small bilateral pleural effusions. Associated compressive atelectasis. Mild interstitial thickening consistent with pulmonary edema. No pneumothorax or confluent airspace disease. Surgical clips in the left axilla. IMPRESSION: Congestive heart failure with mild pulmonary edema and small bilateral pleural effusions. Electronically Signed   By: Keith Rake M.D.   On: 05/16/2020 19:18    Cardiac Studies  TTE 06/29/2019 1. Left ventricular ejection fraction, by estimation, is 55 to 60%. The  left ventricle has normal function. The left ventricle has no regional  wall motion abnormalities. Left ventricular diastolic parameters are  consistent with Grade II diastolic  dysfunction (pseudonormalization). Elevated left atrial pressure.  2. Right ventricular systolic function is normal. The right ventricular  size is normal. There is moderately elevated pulmonary artery systolic  pressure.  3. Left atrial size was moderately dilated.  4. The mitral valve is normal in structure. Mild to moderate mitral valve  regurgitation.  5. Tricuspid valve regurgitation is mild to moderate.  6. The aortic valve is tricuspid. Aortic valve regurgitation is mild. No  aortic stenosis is present.  7. Aortic dilatation noted. There is borderline dilatation of the  ascending aorta measuring 37 mm.  8. The inferior vena cava is dilated in size with >50% respiratory  variability, suggesting right atrial pressure of 8 mmHg.   Patient Profile  [name] is a 87 y.o. female with nonischemic cardiomyopathy with recovery of EF status post BiV ICD, persistent atrial fibrillation, left bundle branch block, hypertension, hypothyroidism who was admitted on 05/16/2020 for acute decompensated diastolic heart failure and A. fib with RVR.  Assessment & Plan   1.  Acute on chronic systolic heart failure with recovery of  EF/BiV-ICD -Admitted with volume overload.  EF normal in April.  Has had recurrence of atrial fibrillation. -Repeat echo pending. -Still volume overloaded.  Needs aggressive diuresis.  40 mg IV twice daily. -Strict I's and O's. -Keep potassium above 4 magnesium above 2 -We will need a rhythm control strategy for atrial fibrillation. -We will add guideline directed medical therapy depending on her ejection fraction.  Echo was today.  2.  Persistent atrial fibrillation -Rates better on diltiazem drip.  I suspect this is being driven by congestive heart failure.  Hopefully rates will continue to improve with diuresis. -If EF is reduced we will stop diltiazem.  BP is stable now.  I suspect her EF is normal. -Continue Xarelto -It appears Tikosyn was recommended yesterday.  She is very hesitant to do this.  We will continue with diuresis and she will think about this today.  -Hopefully she will decide and we can start her on Tikosyn tomorrow.  She will plan for cardioversion this admission nonetheless.  Plan for this on Monday.  She may just do fine with control of her fluid and maintain rhythm that way.  I suspect her recurrence of atrial fibrillation is due to congestive heart failure. -She will need potassium above 4 magnesium above 2.  Replacement as needed.  3.  Hypokalemia -Secondary to diuresis.  Replacement.  4.  AAA -Stable.  FEN -No intravenous fluids -Diet: Salt restricted -DVT PPx: Xarelto Code: Full  For questions or updates, please contact [place] Please consult [email or web address] for contact info under   Time Spent with Patient: I have spent a total of 25 minutes with patient reviewing hospital notes, telemetry, EKGs, labs and examining the patient as well as establishing an assessment and plan that was discussed with the patient.  > 50% of time was spent in direct patient care.    Signed, Addison Naegeli. Audie Box, MD, [place]  05/17/2020 8:21 AM

## 2020-05-17 NOTE — Progress Notes (Signed)
I spoke with Dr Marisue Ivan.  Patient requiring substantial diuresis.  She declines initiation of tikosyn at this time.  EP to formally consult if needed on Monday.  Available by phone this weekend.  Thompson Grayer MD, [place] 05/17/2020 10:06 AM

## 2020-05-17 NOTE — Progress Notes (Signed)
EF 25% with global hypokinesis. Will stop diltiazem drip. Load with digoxin for rate control. If rates climb, will need amiodarone due to soft Bps and severely reduced LVEF. Plan was for tikosyn but this does not appear to be best option given need for aggressive diuresis.   [place] T. Audie Box, MD, [place]  [address], [place], [place] [address] [phone]  1:51 PM

## 2020-05-18 LAB — BASIC METABOLIC PANEL
Anion gap: 7 (ref 5–15)
BUN: 29 mg/dL — ABNORMAL HIGH (ref 8–23)
CO2: 23 mmol/L (ref 22–32)
Calcium: 8.5 mg/dL — ABNORMAL LOW (ref 8.9–10.3)
Chloride: 109 mmol/L (ref 98–111)
Creatinine, Ser: 1.08 mg/dL — ABNORMAL HIGH (ref 0.44–1.00)
GFR, Estimated: 50 mL/min — ABNORMAL LOW (ref >60)
Glucose, Bld: 97 mg/dL (ref 70–99)
Potassium: 3.5 mmol/L (ref 3.5–5.1)
Sodium: 139 mmol/L (ref 135–145)

## 2020-05-18 LAB — MAGNESIUM: Magnesium: 1.8 mg/dL (ref 1.7–2.4)

## 2020-05-18 LAB — DIGOXIN LEVEL: Digoxin Level: 1.9 ng/mL (ref 0.8–2.0)

## 2020-05-18 LAB — PROTIME-INR
INR: 1.7 — ABNORMAL HIGH (ref 0.8–1.2)
Prothrombin Time: 19 seconds — ABNORMAL HIGH (ref 11.4–15.2)

## 2020-05-18 MED ORDER — MAGNESIUM SULFATE 2 GM/50ML IV SOLN
2.0000 g | Freq: Once | INTRAVENOUS | Status: AC
  Administered 2020-05-18: 2 g via INTRAVENOUS

## 2020-05-18 MED ORDER — LOSARTAN POTASSIUM 25 MG PO TABS
12.5000 mg | ORAL_TABLET | Freq: Every day | ORAL | Status: DC
  Administered 2020-05-18 – 2020-05-20 (×3): 12.5 mg via ORAL
  Filled 2020-05-18 (×3): qty 1

## 2020-05-18 MED ORDER — POTASSIUM CHLORIDE CRYS ER 20 MEQ PO TBCR
40.0000 meq | EXTENDED_RELEASE_TABLET | Freq: Once | ORAL | Status: AC
  Administered 2020-05-18: 40 meq via ORAL
  Filled 2020-05-18: qty 2

## 2020-05-18 MED ORDER — FUROSEMIDE 10 MG/ML IJ SOLN
80.0000 mg | Freq: Two times a day (BID) | INTRAMUSCULAR | Status: DC
  Administered 2020-05-18 – 2020-05-20 (×5): 80 mg via INTRAVENOUS
  Filled 2020-05-18 (×5): qty 8

## 2020-05-18 NOTE — H&P (View-Only) (Signed)
Cardiology Progress Note  Patient ID: [name] MRN: [id number] DOB: 12/12/1932 Date of Encounter: 05/18/2020  Primary Cardiologist: No primary care provider on file.  Subjective   Chief Complaint: Shortness of breath  HPI: EF severely reduced.  Amiodarone was started due to soft blood pressure.  Digoxin was stopped due to potential interaction.  Her diltiazem was also stopped due to her low EF.  Good urine output.  Needs more diuresis.  ROS:  All other ROS reviewed and negative. Pertinent positives noted in the HPI.     Inpatient Medications  Scheduled Meds: . buPROPion  150 mg Oral Daily  . furosemide  80 mg Intravenous BID  . levothyroxine  100 mcg Oral QAC breakfast  . losartan  12.5 mg Oral Daily  . potassium chloride  40 mEq Oral Daily  . potassium chloride  40 mEq Oral Once  . Rivaroxaban  15 mg Oral Q supper  . sodium chloride flush  3 mL Intravenous Q12H   Continuous Infusions: . sodium chloride    . amiodarone 30 mg/hr (05/18/20 0425)  . magnesium sulfate bolus IVPB     PRN Meds: sodium chloride, acetaminophen, ondansetron (ZOFRAN) IV, sodium chloride flush   Vital Signs   Vitals:   05/17/20 1900 05/17/20 2129 05/18/20 0026 05/18/20 0516  BP: 111/79 122/73 118/88 (!) 120/91  Pulse: 95 96 98 85  Resp:  18 18 18   Temp:  98.2 F (36.8 C) 98.4 F (36.9 C) 98 F (36.7 C)  TempSrc:  Oral Oral Oral  SpO2: 94% 92% 91% 90%  Weight:    60.6 kg    Intake/Output Summary (Last 24 hours) at 05/18/2020 0728 Last data filed at 05/18/2020 0425 Gross per 24 hour  Intake 963.66 ml  Output 1450 ml  Net -486.34 ml   Last 3 Weights 05/18/2020 05/17/2020 05/16/2020  Weight (lbs) 133 lb 9.6 oz 146 lb 2.6 oz 148 lb  Weight (kg) 60.6 kg 66.3 kg 67.132 kg      Telemetry  Overnight telemetry shows atrial fibrillation with heart rate in the 90-100 bpm range, which I personally reviewed.   ECG  The most recent ECG shows atrial fibrillation with intermittent pacing, left  bundle branch block, which I personally reviewed.   Physical Exam   Vitals:   05/17/20 1900 05/17/20 2129 05/18/20 0026 05/18/20 0516  BP: 111/79 122/73 118/88 (!) 120/91  Pulse: 95 96 98 85  Resp:  18 18 18   Temp:  98.2 F (36.8 C) 98.4 F (36.9 C) 98 F (36.7 C)  TempSrc:  Oral Oral Oral  SpO2: 94% 92% 91% 90%  Weight:    60.6 kg     Intake/Output Summary (Last 24 hours) at 05/18/2020 0728 Last data filed at 05/18/2020 0425 Gross per 24 hour  Intake 963.66 ml  Output 1450 ml  Net -486.34 ml    Last 3 Weights 05/18/2020 05/17/2020 05/16/2020  Weight (lbs) 133 lb 9.6 oz 146 lb 2.6 oz 148 lb  Weight (kg) 60.6 kg 66.3 kg 67.132 kg    Body mass index is 20.31 kg/m.   General: Well nourished, well developed, in no acute distress Head: Atraumatic, normal size  Eyes: PEERLA, EOMI  Neck: Supple, JVD 10 to 12 cm of water Endocrine: No thryomegaly Cardiac: Normal S1, S2; irregular rhythm Lungs: Crackles at the lung bases Abd: Soft, nontender, no hepatomegaly  Ext: 2+ pitting edema Musculoskeletal: No deformities, BUE and BLE strength normal and equal Skin: Warm and dry, no rashes  Neuro: Alert and oriented to person, place, time, and situation, CNII-XII grossly intact, no focal deficits  Psych: Normal mood and affect   Labs  High Sensitivity Troponin:  No results for input(s): TROPONINIHS in the last 720 hours.   Cardiac EnzymesNo results for input(s): TROPONINI in the last 168 hours. No results for input(s): TROPIPOC in the last 168 hours.  Chemistry Recent Labs  Lab 05/16/20 1819 05/17/20 0259 05/18/20 0459  NA 136 137 139  K 3.7 3.5 3.5  CL 107 109 109  CO2 21* 18* 23  GLUCOSE 96 100* 97  BUN 32* 32* 29*  CREATININE 0.93 0.97 1.08*  CALCIUM 8.7* 8.9 8.5*  GFRNONAA >60 57* 50*  ANIONGAP 8 10 7     Hematology Recent Labs  Lab 05/16/20 1819  WBC 7.4  RBC 3.73*  HGB 10.8*  HCT 34.7*  MCV 93.0  MCH 29.0  MCHC 31.1  RDW 13.2  PLT 216   BNP Recent Labs   Lab 05/16/20 1819  BNP 1,299.8*    DDimer No results for input(s): DDIMER in the last 168 hours.   Radiology  DG Chest 2 View  Result Date: 05/16/2020 CLINICAL DATA:  Shortness of breath.  CHF exacerbation. EXAM: CHEST - 2 VIEW COMPARISON:  Radiograph 03/30/2019 FINDINGS: Multi lead left-sided pacemaker in place. Lower lung volumes from prior exam. Mild cardiomegaly. Small bilateral pleural effusions. Associated compressive atelectasis. Mild interstitial thickening consistent with pulmonary edema. No pneumothorax or confluent airspace disease. Surgical clips in the left axilla. IMPRESSION: Congestive heart failure with mild pulmonary edema and small bilateral pleural effusions. Electronically Signed   By: Keith Rake M.D.   On: 05/16/2020 19:18   ECHOCARDIOGRAM COMPLETE  Result Date: 05/17/2020    ECHOCARDIOGRAM REPORT   Patient Name:   [name] Date of Exam: 05/17/2020 Medical Rec #:  [id number]       Height:       68.0 in Accession #:    [id number]      Weight:       146.2 lb Date of Birth:  [birth year]        BSA:          1.789 m Patient Age:    50 years        BP:           106/77 mmHg Patient Gender: F               HR:           88 bpm. Exam Location:  Inpatient Procedure: 2D Echo, Cardiac Doppler and Color Doppler Indications:    CHF  History:        Patient has prior history of Echocardiogram examinations, most                 recent 06/29/2019. CHF and Cardiomyopathy, Defibrillator,                 Arrythmias:Atrial Fibrillation; Risk Factors:Hypertension.  Sonographer:    Dustin Flock Referring Phys: [place]  1. Left ventricular ejection fraction, by estimation, is 25 to 30%. The left ventricle has severely decreased function. The left ventricle demonstrates global hypokinesis. Left ventricular diastolic function could not be evaluated.  2. Right ventricular systolic function is mildly reduced. The right ventricular size is mildly enlarged. There is  moderately elevated pulmonary artery systolic pressure. The estimated right ventricular systolic pressure is 65.0 mmHg.  3. Left atrial size was severely dilated.  4. Right atrial size was mildly dilated.  5. A small pericardial effusion is present. The pericardial effusion is circumferential.  6. The mitral valve is grossly normal. Moderate to severe mitral valve regurgitation. No evidence of mitral stenosis.  7. Tricuspid valve regurgitation is severe.  8. The aortic valve is tricuspid. Aortic valve regurgitation is not visualized. No aortic stenosis is present.  9. The inferior vena cava is dilated in size with <50% respiratory variability, suggesting right atrial pressure of 15 mmHg. Comparison(s): Changes from prior study are noted. EF now severely reduced. Moderately dilated RV. Moderate to severe MR. Severe TR. RVSP ~49 mmHG. FINDINGS  Left Ventricle: Left ventricular ejection fraction, by estimation, is 25 to 30%. The left ventricle has severely decreased function. The left ventricle demonstrates global hypokinesis. The left ventricular internal cavity size was normal in size. There is no left ventricular hypertrophy. Abnormal (paradoxical) septal motion, consistent with left bundle branch block. Left ventricular diastolic function could not be evaluated due to atrial fibrillation. Left ventricular diastolic function could not be evaluated. Right Ventricle: The right ventricular size is mildly enlarged. No increase in right ventricular wall thickness. Right ventricular systolic function is mildly reduced. There is moderately elevated pulmonary artery systolic pressure. The tricuspid regurgitant velocity is 2.91 m/s, and with an assumed right atrial pressure of 15 mmHg, the estimated right ventricular systolic pressure is 65.7 mmHg. Left Atrium: Left atrial size was severely dilated. Right Atrium: Right atrial size was mildly dilated. Pericardium: A small pericardial effusion is present. The pericardial  effusion is circumferential. Mitral Valve: The mitral valve is grossly normal. Moderate to severe mitral valve regurgitation. No evidence of mitral valve stenosis. Tricuspid Valve: The tricuspid valve is grossly normal. Tricuspid valve regurgitation is severe. No evidence of tricuspid stenosis. Aortic Valve: The aortic valve is tricuspid. Aortic valve regurgitation is not visualized. No aortic stenosis is present. Pulmonic Valve: The pulmonic valve was grossly normal. Pulmonic valve regurgitation is trivial. No evidence of pulmonic stenosis. Aorta: The aortic root is normal in size and structure. Venous: The inferior vena cava is dilated in size with less than 50% respiratory variability, suggesting right atrial pressure of 15 mmHg. IAS/Shunts: The atrial septum is grossly normal. Additional Comments: A device lead is visualized in the right atrium and right ventricle. There is a small pleural effusion in the left lateral region.  LEFT VENTRICLE PLAX 2D LVIDd:         5.30 cm  Diastology LVIDs:         4.40 cm  LV e' medial:    8.05 cm/s LV PW:         1.00 cm  LV E/e' medial:  13.5 LV IVS:        1.00 cm  LV e' lateral:   11.10 cm/s LVOT diam:     2.20 cm  LV E/e' lateral: 9.8 LV SV:         63 LV SV Index:   35 LVOT Area:     3.80 cm  RIGHT VENTRICLE RV Basal diam:  2.80 cm RV S prime:     5.55 cm/s TAPSE (M-mode): 2.4 cm LEFT ATRIUM              Index       RIGHT ATRIUM           Index LA diam:        4.20 cm  2.35 cm/m  RA Area:     19.10 cm LA Vol (  A2C):   54.5 ml  30.47 ml/m RA Volume:   51.40 ml  28.74 ml/m LA Vol (A4C):   103.0 ml 57.58 ml/m LA Biplane Vol: 81.3 ml  45.45 ml/m  AORTIC VALVE LVOT Vmax:   101.00 cm/s LVOT Vmean:  59.300 cm/s LVOT VTI:    0.166 m  AORTA Ao Root diam: 3.30 cm MITRAL VALVE                TRICUSPID VALVE MV Area (PHT): 7.16 cm     TR Peak grad:   33.9 mmHg MV Decel Time: 106 msec     TR Vmax:        291.00 cm/s MV E velocity: 109.00 cm/s MV A velocity: 35.00 cm/s   SHUNTS  MV E/A ratio:  3.11         Systemic VTI:  0.17 m                             Systemic Diam: 2.20 cm Eleonore Chiquito MD Electronically signed by Eleonore Chiquito MD Signature Date/Time: 05/17/2020/1:48:27 PM    Final     Cardiac Studies  TTE 05/17/2020 1. Left ventricular ejection fraction, by estimation, is 25 to 30%. The  left ventricle has severely decreased function. The left ventricle  demonstrates global hypokinesis. Left ventricular diastolic function could  not be evaluated.  2. Right ventricular systolic function is mildly reduced. The right  ventricular size is mildly enlarged. There is moderately elevated  pulmonary artery systolic pressure. The estimated right ventricular  systolic pressure is 11.1 mmHg.  3. Left atrial size was severely dilated.  4. Right atrial size was mildly dilated.  5. A small pericardial effusion is present. The pericardial effusion is  circumferential.  6. The mitral valve is grossly normal. Moderate to severe mitral valve  regurgitation. No evidence of mitral stenosis.  7. Tricuspid valve regurgitation is severe.  8. The aortic valve is tricuspid. Aortic valve regurgitation is not  visualized. No aortic stenosis is present.  9. The inferior vena cava is dilated in size with <50% respiratory  variability, suggesting right atrial pressure of 15 mmHg.   Patient Profile  BERYL HORNBERGER is a 87 y.o. female with nonischemic cardiomyopathy with recovery of EF status post BiV ICD, persistent atrial fibrillation, left bundle branch block, hypertension, hypothyroidism who was admitted on 05/16/2020 for acute decompensated diastolic heart failure and A. fib with RVR.  Assessment & Plan   1.  Acute on chronic systolic heart failure status post BiV ICD, now with reduced ejection fraction 25-30% -Admitted with A. fib with RVR and volume overload.  EF now down to 25-30%. -Still needs aggressive diuresis. -Increase Lasix to 80 mg IV twice daily -Diltiazem  held in setting of low EF. -Borderline pressures.  With holding beta-blocker. -We have plans for digoxin however interaction with amiodarone will preclude this.  She seems to be well rate controlled on amiodarone. -Given her drop in EF amiodarone probably is a better option for rhythm control strategy.  There was a discussion for T consent with the EP on admission.  Given soft blood pressures amiodarone was a better option and will preclude Tikosyn loading this admission. -Losartan 12.5 mg daily added.  We can add other guideline directed therapy once she is back in sinus rhythm. -Overall suspect her drop in EF is related to A. fib.  Given her age would recommend to treat her A. fib and  heart failure medically and then to reassess EF.  Would not pursue ischemic evaluation unless EF does not recover.  2.  Persistent atrial fibrillation -Diltiazem drip stopped due to low EF. -She was on digoxin however interaction with amiodarone has precluded this.  We will continue amiodarone IV today. -She is on Xarelto.  No missed doses in the last 3 weeks.  We will continue with diuresis today and plan for cardioversion on Monday.  Hopefully she will maintain sinus rhythm on amiodarone and when she is more euvolemic. -Risk benefits of cardioversion discussed.  See shared decision making statement below.  Shared Decision Making/Informed Consent The risks (stroke, cardiac arrhythmias rarely resulting in the need for a temporary or permanent pacemaker, skin irritation or burns and complications associated with conscious sedation including aspiration, arrhythmia, respiratory failure and death), benefits (restoration of normal sinus rhythm) and alternatives of a direct current cardioversion were explained in detail to Ms. Winger and she agrees to proceed.   3.  Hypokalemia -Replacement.  Secondary to diuresis.  4.  AAA -Stable.  FEN -No intravenous fluids -Diet: Salt restricted -DVT PPx: Xarelto -Code: full    For questions or updates, please contact [place] Please consult [email or web address] for contact info under   Time Spent with Patient: I have spent a total of 25 minutes with patient reviewing hospital notes, telemetry, EKGs, labs and examining the patient as well as establishing an assessment and plan that was discussed with the patient.  > 50% of time was spent in direct patient care.    Signed, Addison Naegeli. Audie Box, MD, [place]  05/18/2020 7:28 AM

## 2020-05-18 NOTE — Progress Notes (Signed)
Cardiology Progress Note  Patient ID: [name] MRN: [id number] DOB: [birth year] Date of Encounter: 05/18/2020  Primary Cardiologist: No primary care provider on file.  Subjective   Chief Complaint: Shortness of breath  HPI: EF severely reduced.  Amiodarone was started due to soft blood pressure.  Digoxin was stopped due to potential interaction.  Her diltiazem was also stopped due to her low EF.  Good urine output.  Needs more diuresis.  ROS:  All other ROS reviewed and negative. Pertinent positives noted in the HPI.     Inpatient Medications  Scheduled Meds: . buPROPion  150 mg Oral Daily  . furosemide  80 mg Intravenous BID  . levothyroxine  100 mcg Oral QAC breakfast  . losartan  12.5 mg Oral Daily  . potassium chloride  40 mEq Oral Daily  . potassium chloride  40 mEq Oral Once  . Rivaroxaban  15 mg Oral Q supper  . sodium chloride flush  3 mL Intravenous Q12H   Continuous Infusions: . sodium chloride    . amiodarone 30 mg/hr (05/18/20 0425)  . magnesium sulfate bolus IVPB     PRN Meds: sodium chloride, acetaminophen, ondansetron (ZOFRAN) IV, sodium chloride flush   Vital Signs   Vitals:   05/17/20 1900 05/17/20 2129 05/18/20 0026 05/18/20 0516  BP: 111/79 122/73 118/88 (!) 120/91  Pulse: 95 96 98 85  Resp:  18 18 18   Temp:  98.2 F (36.8 C) 98.4 F (36.9 C) 98 F (36.7 C)  TempSrc:  Oral Oral Oral  SpO2: 94% 92% 91% 90%  Weight:    60.6 kg    Intake/Output Summary (Last 24 hours) at 05/18/2020 0728 Last data filed at 05/18/2020 0425 Gross per 24 hour  Intake 963.66 ml  Output 1450 ml  Net -486.34 ml   Last 3 Weights 05/18/2020 05/17/2020 05/16/2020  Weight (lbs) 133 lb 9.6 oz 146 lb 2.6 oz 148 lb  Weight (kg) 60.6 kg 66.3 kg 67.132 kg      Telemetry  Overnight telemetry shows atrial fibrillation with heart rate in the 90-100 bpm range, which I personally reviewed.   ECG  The most recent ECG shows atrial fibrillation with intermittent pacing, left  bundle branch block, which I personally reviewed.   Physical Exam   Vitals:   05/17/20 1900 05/17/20 2129 05/18/20 0026 05/18/20 0516  BP: 111/79 122/73 118/88 (!) 120/91  Pulse: 95 96 98 85  Resp:  18 18 18   Temp:  98.2 F (36.8 C) 98.4 F (36.9 C) 98 F (36.7 C)  TempSrc:  Oral Oral Oral  SpO2: 94% 92% 91% 90%  Weight:    60.6 kg     Intake/Output Summary (Last 24 hours) at 05/18/2020 0728 Last data filed at 05/18/2020 0425 Gross per 24 hour  Intake 963.66 ml  Output 1450 ml  Net -486.34 ml    Last 3 Weights 05/18/2020 05/17/2020 05/16/2020  Weight (lbs) 133 lb 9.6 oz 146 lb 2.6 oz 148 lb  Weight (kg) 60.6 kg 66.3 kg 67.132 kg    Body mass index is 20.31 kg/m.   General: Well nourished, well developed, in no acute distress Head: Atraumatic, normal size  Eyes: PEERLA, EOMI  Neck: Supple, JVD 10 to 12 cm of water Endocrine: No thryomegaly Cardiac: Normal S1, S2; irregular rhythm Lungs: Crackles at the lung bases Abd: Soft, nontender, no hepatomegaly  Ext: 2+ pitting edema Musculoskeletal: No deformities, BUE and BLE strength normal and equal Skin: Warm and dry, no rashes  Neuro: Alert and oriented to person, place, time, and situation, CNII-XII grossly intact, no focal deficits  Psych: Normal mood and affect   Labs  High Sensitivity Troponin:  No results for input(s): TROPONINIHS in the last 720 hours.   Cardiac EnzymesNo results for input(s): TROPONINI in the last 168 hours. No results for input(s): TROPIPOC in the last 168 hours.  Chemistry Recent Labs  Lab 05/16/20 1819 05/17/20 0259 05/18/20 0459  NA 136 137 139  K 3.7 3.5 3.5  CL 107 109 109  CO2 21* 18* 23  GLUCOSE 96 100* 97  BUN 32* 32* 29*  CREATININE 0.93 0.97 1.08*  CALCIUM 8.7* 8.9 8.5*  GFRNONAA >60 57* 50*  ANIONGAP 8 10 7     Hematology Recent Labs  Lab 05/16/20 1819  WBC 7.4  RBC 3.73*  HGB 10.8*  HCT 34.7*  MCV 93.0  MCH 29.0  MCHC 31.1  RDW 13.2  PLT 216   BNP Recent Labs   Lab 05/16/20 1819  BNP 1,299.8*    DDimer No results for input(s): DDIMER in the last 168 hours.   Radiology  DG Chest 2 View  Result Date: 05/16/2020 CLINICAL DATA:  Shortness of breath.  CHF exacerbation. EXAM: CHEST - 2 VIEW COMPARISON:  Radiograph 03/30/2019 FINDINGS: Multi lead left-sided pacemaker in place. Lower lung volumes from prior exam. Mild cardiomegaly. Small bilateral pleural effusions. Associated compressive atelectasis. Mild interstitial thickening consistent with pulmonary edema. No pneumothorax or confluent airspace disease. Surgical clips in the left axilla. IMPRESSION: Congestive heart failure with mild pulmonary edema and small bilateral pleural effusions. Electronically Signed   By: Keith Rake M.D.   On: 05/16/2020 19:18   ECHOCARDIOGRAM COMPLETE  Result Date: 05/17/2020    ECHOCARDIOGRAM REPORT   Patient Name:   [name] Date of Exam: 05/17/2020 Medical Rec #:  [id number]       Height:       68.0 in Accession #:    [id number]      Weight:       146.2 lb Date of Birth:  May 15, 1932        BSA:          1.789 m Patient Age:    87 years        BP:           106/77 mmHg Patient Gender: F               HR:           88 bpm. Exam Location:  Inpatient Procedure: 2D Echo, Cardiac Doppler and Color Doppler Indications:    CHF  History:        Patient has prior history of Echocardiogram examinations, most                 recent 06/29/2019. CHF and Cardiomyopathy, Defibrillator,                 Arrythmias:Atrial Fibrillation; Risk Factors:Hypertension.  Sonographer:    Dustin Flock Referring Phys: [place]  1. Left ventricular ejection fraction, by estimation, is 25 to 30%. The left ventricle has severely decreased function. The left ventricle demonstrates global hypokinesis. Left ventricular diastolic function could not be evaluated.  2. Right ventricular systolic function is mildly reduced. The right ventricular size is mildly enlarged. There is  moderately elevated pulmonary artery systolic pressure. The estimated right ventricular systolic pressure is 67.5 mmHg.  3. Left atrial size was severely dilated.  4. Right atrial size was mildly dilated.  5. A small pericardial effusion is present. The pericardial effusion is circumferential.  6. The mitral valve is grossly normal. Moderate to severe mitral valve regurgitation. No evidence of mitral stenosis.  7. Tricuspid valve regurgitation is severe.  8. The aortic valve is tricuspid. Aortic valve regurgitation is not visualized. No aortic stenosis is present.  9. The inferior vena cava is dilated in size with <50% respiratory variability, suggesting right atrial pressure of 15 mmHg. Comparison(s): Changes from prior study are noted. EF now severely reduced. Moderately dilated RV. Moderate to severe MR. Severe TR. RVSP ~49 mmHG. FINDINGS  Left Ventricle: Left ventricular ejection fraction, by estimation, is 25 to 30%. The left ventricle has severely decreased function. The left ventricle demonstrates global hypokinesis. The left ventricular internal cavity size was normal in size. There is no left ventricular hypertrophy. Abnormal (paradoxical) septal motion, consistent with left bundle branch block. Left ventricular diastolic function could not be evaluated due to atrial fibrillation. Left ventricular diastolic function could not be evaluated. Right Ventricle: The right ventricular size is mildly enlarged. No increase in right ventricular wall thickness. Right ventricular systolic function is mildly reduced. There is moderately elevated pulmonary artery systolic pressure. The tricuspid regurgitant velocity is 2.91 m/s, and with an assumed right atrial pressure of 15 mmHg, the estimated right ventricular systolic pressure is 83.1 mmHg. Left Atrium: Left atrial size was severely dilated. Right Atrium: Right atrial size was mildly dilated. Pericardium: A small pericardial effusion is present. The pericardial  effusion is circumferential. Mitral Valve: The mitral valve is grossly normal. Moderate to severe mitral valve regurgitation. No evidence of mitral valve stenosis. Tricuspid Valve: The tricuspid valve is grossly normal. Tricuspid valve regurgitation is severe. No evidence of tricuspid stenosis. Aortic Valve: The aortic valve is tricuspid. Aortic valve regurgitation is not visualized. No aortic stenosis is present. Pulmonic Valve: The pulmonic valve was grossly normal. Pulmonic valve regurgitation is trivial. No evidence of pulmonic stenosis. Aorta: The aortic root is normal in size and structure. Venous: The inferior vena cava is dilated in size with less than 50% respiratory variability, suggesting right atrial pressure of 15 mmHg. IAS/Shunts: The atrial septum is grossly normal. Additional Comments: A device lead is visualized in the right atrium and right ventricle. There is a small pleural effusion in the left lateral region.  LEFT VENTRICLE PLAX 2D LVIDd:         5.30 cm  Diastology LVIDs:         4.40 cm  LV e' medial:    8.05 cm/s LV PW:         1.00 cm  LV E/e' medial:  13.5 LV IVS:        1.00 cm  LV e' lateral:   11.10 cm/s LVOT diam:     2.20 cm  LV E/e' lateral: 9.8 LV SV:         63 LV SV Index:   35 LVOT Area:     3.80 cm  RIGHT VENTRICLE RV Basal diam:  2.80 cm RV S prime:     5.55 cm/s TAPSE (M-mode): 2.4 cm LEFT ATRIUM              Index       RIGHT ATRIUM           Index LA diam:        4.20 cm  2.35 cm/m  RA Area:     19.10 cm LA Vol (  A2C):   54.5 ml  30.47 ml/m RA Volume:   51.40 ml  28.74 ml/m LA Vol (A4C):   103.0 ml 57.58 ml/m LA Biplane Vol: 81.3 ml  45.45 ml/m  AORTIC VALVE LVOT Vmax:   101.00 cm/s LVOT Vmean:  59.300 cm/s LVOT VTI:    0.166 m  AORTA Ao Root diam: 3.30 cm MITRAL VALVE                TRICUSPID VALVE MV Area (PHT): 7.16 cm     TR Peak grad:   33.9 mmHg MV Decel Time: 106 msec     TR Vmax:        291.00 cm/s MV E velocity: 109.00 cm/s MV A velocity: 35.00 cm/s   SHUNTS  MV E/A ratio:  3.11         Systemic VTI:  0.17 m                             Systemic Diam: 2.20 cm Eleonore Chiquito MD Electronically signed by Eleonore Chiquito MD Signature Date/Time: 05/17/2020/1:48:27 PM    Final     Cardiac Studies  TTE 05/17/2020 1. Left ventricular ejection fraction, by estimation, is 25 to 30%. The  left ventricle has severely decreased function. The left ventricle  demonstrates global hypokinesis. Left ventricular diastolic function could  not be evaluated.  2. Right ventricular systolic function is mildly reduced. The right  ventricular size is mildly enlarged. There is moderately elevated  pulmonary artery systolic pressure. The estimated right ventricular  systolic pressure is 89.2 mmHg.  3. Left atrial size was severely dilated.  4. Right atrial size was mildly dilated.  5. A small pericardial effusion is present. The pericardial effusion is  circumferential.  6. The mitral valve is grossly normal. Moderate to severe mitral valve  regurgitation. No evidence of mitral stenosis.  7. Tricuspid valve regurgitation is severe.  8. The aortic valve is tricuspid. Aortic valve regurgitation is not  visualized. No aortic stenosis is present.  9. The inferior vena cava is dilated in size with <50% respiratory  variability, suggesting right atrial pressure of 15 mmHg.   Patient Profile  [name] is a 87 y.o. female with nonischemic cardiomyopathy with recovery of EF status post BiV ICD, persistent atrial fibrillation, left bundle branch block, hypertension, hypothyroidism who was admitted on 05/16/2020 for acute decompensated diastolic heart failure and A. fib with RVR.  Assessment & Plan   1.  Acute on chronic systolic heart failure status post BiV ICD, now with reduced ejection fraction 25-30% -Admitted with A. fib with RVR and volume overload.  EF now down to 25-30%. -Still needs aggressive diuresis. -Increase Lasix to 80 mg IV twice daily -Diltiazem  held in setting of low EF. -Borderline pressures.  With holding beta-blocker. -We have plans for digoxin however interaction with amiodarone will preclude this.  She seems to be well rate controlled on amiodarone. -Given her drop in EF amiodarone probably is a better option for rhythm control strategy.  There was a discussion for T consent with the EP on admission.  Given soft blood pressures amiodarone was a better option and will preclude Tikosyn loading this admission. -Losartan 12.5 mg daily added.  We can add other guideline directed therapy once she is back in sinus rhythm. -Overall suspect her drop in EF is related to A. fib.  Given her age would recommend to treat her A. fib and  heart failure medically and then to reassess EF.  Would not pursue ischemic evaluation unless EF does not recover.  2.  Persistent atrial fibrillation -Diltiazem drip stopped due to low EF. -She was on digoxin however interaction with amiodarone has precluded this.  We will continue amiodarone IV today. -She is on Xarelto.  No missed doses in the last 3 weeks.  We will continue with diuresis today and plan for cardioversion on Monday.  Hopefully she will maintain sinus rhythm on amiodarone and when she is more euvolemic. -Risk benefits of cardioversion discussed.  See shared decision making statement below.  Shared Decision Making/Informed Consent The risks (stroke, cardiac arrhythmias rarely resulting in the need for a temporary or permanent pacemaker, skin irritation or burns and complications associated with conscious sedation including aspiration, arrhythmia, respiratory failure and death), benefits (restoration of normal sinus rhythm) and alternatives of a direct current cardioversion were explained in detail to [name] and she agrees to proceed.   3.  Hypokalemia -Replacement.  Secondary to diuresis.  4.  AAA -Stable.  FEN -No intravenous fluids -Diet: Salt restricted -DVT PPx: Xarelto -Code: full    For questions or updates, please contact [place] Please consult [email or web address] for contact info under   Time Spent with Patient: I have spent a total of 25 minutes with patient reviewing hospital notes, telemetry, EKGs, labs and examining the patient as well as establishing an assessment and plan that was discussed with the patient.  > 50% of time was spent in direct patient care.    Signed, Addison Naegeli. Audie Box, MD, [place]  05/18/2020 7:28 AM

## 2020-05-19 ENCOUNTER — Inpatient Hospital Stay (HOSPITAL_COMMUNITY): Payer: Medicare Other | Admitting: Anesthesiology

## 2020-05-19 ENCOUNTER — Encounter (HOSPITAL_COMMUNITY): Payer: Self-pay | Admitting: Cardiology

## 2020-05-19 ENCOUNTER — Encounter (HOSPITAL_COMMUNITY)
Admission: EM | Disposition: A | Discharge status: Home / Self Care | Payer: Self-pay | Source: Ambulatory Visit | Attending: Cardiology

## 2020-05-19 DIAGNOSIS — I5023 Acute on chronic systolic (congestive) heart failure: Secondary | ICD-10-CM

## 2020-05-19 DIAGNOSIS — I480 Paroxysmal atrial fibrillation: Secondary | ICD-10-CM

## 2020-05-19 DIAGNOSIS — E039 Hypothyroidism, unspecified: Secondary | ICD-10-CM

## 2020-05-19 HISTORY — PX: CARDIOVERSION: SHX1299

## 2020-05-19 LAB — BASIC METABOLIC PANEL
Anion gap: 10 (ref 5–15)
BUN: 29 mg/dL — ABNORMAL HIGH (ref 8–23)
CO2: 27 mmol/L (ref 22–32)
Calcium: 8.5 mg/dL — ABNORMAL LOW (ref 8.9–10.3)
Chloride: 104 mmol/L (ref 98–111)
Creatinine, Ser: 1.03 mg/dL — ABNORMAL HIGH (ref 0.44–1.00)
GFR, Estimated: 53 mL/min — ABNORMAL LOW (ref >60)
Glucose, Bld: 111 mg/dL — ABNORMAL HIGH (ref 70–99)
Potassium: 3.6 mmol/L (ref 3.5–5.1)
Sodium: 141 mmol/L (ref 135–145)

## 2020-05-19 LAB — CBC WITH DIFFERENTIAL/PLATELET
Abs Immature Granulocytes: 0.04 10*3/uL (ref 0.00–0.07)
Basophils Absolute: 0 10*3/uL (ref 0.0–0.1)
Basophils Relative: 0 %
Eosinophils Absolute: 0.2 10*3/uL (ref 0.0–0.5)
Eosinophils Relative: 2 %
HCT: 34.5 % — ABNORMAL LOW (ref 36.0–46.0)
Hemoglobin: 11.3 g/dL — ABNORMAL LOW (ref 12.0–15.0)
Immature Granulocytes: 0 %
Lymphocytes Relative: 14 %
Lymphs Abs: 1.3 10*3/uL (ref 0.7–4.0)
MCH: 29.5 pg (ref 26.0–34.0)
MCHC: 32.8 g/dL (ref 30.0–36.0)
MCV: 90.1 fL (ref 80.0–100.0)
Monocytes Absolute: 1.2 10*3/uL — ABNORMAL HIGH (ref 0.1–1.0)
Monocytes Relative: 12 %
Neutro Abs: 6.6 10*3/uL (ref 1.7–7.7)
Neutrophils Relative %: 72 %
Platelets: 213 10*3/uL (ref 150–400)
RBC: 3.83 MIL/uL — ABNORMAL LOW (ref 3.87–5.11)
RDW: 13 % (ref 11.5–15.5)
WBC: 9.3 10*3/uL (ref 4.0–10.5)
nRBC: 0 % (ref 0.0–0.2)

## 2020-05-19 LAB — MAGNESIUM: Magnesium: 2.1 mg/dL (ref 1.7–2.4)

## 2020-05-19 SURGERY — CARDIOVERSION
Anesthesia: General

## 2020-05-19 MED ORDER — SODIUM CHLORIDE 0.9 % IV SOLN: INTRAVENOUS | Status: DC | PRN

## 2020-05-19 MED ORDER — METOPROLOL SUCCINATE ER 25 MG PO TB24
25.0000 mg | ORAL_TABLET | Freq: Every day | ORAL | Status: DC
  Administered 2020-05-20: 25 mg via ORAL
  Filled 2020-05-19 (×2): qty 1

## 2020-05-19 MED ORDER — METOPROLOL SUCCINATE ER 25 MG PO TB24: 12.5000 mg | ORAL_TABLET | Freq: Every day | ORAL | Status: DC

## 2020-05-19 MED ORDER — SODIUM CHLORIDE 0.9 % IV SOLN: INTRAVENOUS | Status: DC

## 2020-05-19 MED ORDER — PROPOFOL 10 MG/ML IV BOLUS
INTRAVENOUS | Status: DC | PRN
  Administered 2020-05-19: 20 mg via INTRAVENOUS

## 2020-05-19 MED ORDER — AMIODARONE HCL 200 MG PO TABS
200.0000 mg | ORAL_TABLET | Freq: Two times a day (BID) | ORAL | Status: DC
  Administered 2020-05-19 – 2020-05-20 (×3): 200 mg via ORAL
  Filled 2020-05-19 (×3): qty 1

## 2020-05-19 MED ORDER — LIDOCAINE 2% (20 MG/ML) 5 ML SYRINGE
INTRAMUSCULAR | Status: DC | PRN
  Administered 2020-05-19: 60 mg via INTRAVENOUS

## 2020-05-19 MED ORDER — ETOMIDATE 2 MG/ML IV SOLN
INTRAVENOUS | Status: DC | PRN
  Administered 2020-05-19: 12 mg via INTRAVENOUS

## 2020-05-19 NOTE — Anesthesia Preprocedure Evaluation (Addendum)
Anesthesia Evaluation  Patient identified by MRN, date of birth, ID band Patient awake    Reviewed: Allergy & Precautions, NPO status , Patient's Chart, lab work & pertinent test results, reviewed documented beta blocker date and time   History of Anesthesia Complications Negative for: history of anesthetic complications  Airway Mallampati: II  TM Distance: >3 FB Neck ROM: Full    Dental no notable dental hx.    Pulmonary COPD, former smoker,    Pulmonary exam normal        Cardiovascular hypertension, Pt. on home beta blockers and Pt. on medications + CAD and +CHF  Normal cardiovascular exam+ dysrhythmias + pacemaker   TTE 05/17/20: EF 25-30%, global hypokinesis, RV systolic function mildly reduced, mild RVE, moderately elevated PASP (48.9 mmHg), severe LAE/RAE, small pericardial effusion, moderate to severe MR, severe TR    Neuro/Psych Anxiety Depression negative neurological ROS     GI/Hepatic Neg liver ROS, GERD  ,  Endo/Other  Hypothyroidism   Renal/GU Renal InsufficiencyRenal disease  negative genitourinary   Musculoskeletal  (+) Arthritis ,   Abdominal   Peds  Hematology Hgb 11.3   Anesthesia Other Findings Day of surgery medications reviewed with patient.  Reproductive/Obstetrics negative OB ROS                            Anesthesia Physical Anesthesia Plan  ASA: IV  Anesthesia Plan: General   Post-op Pain Management:    Induction: Intravenous  PONV Risk Score and Plan: Treatment may vary due to age or medical condition and Propofol infusion  Airway Management Planned: Mask  Additional Equipment: None  Intra-op Plan:   Post-operative Plan:   Informed Consent: I have reviewed the patients History and Physical, chart, labs and discussed the procedure including the risks, benefits and alternatives for the proposed anesthesia with the patient or authorized representative  who has indicated his/her understanding and acceptance.       Plan Discussed with: CRNA  Anesthesia Plan Comments:        Anesthesia Quick Evaluation

## 2020-05-19 NOTE — Progress Notes (Addendum)
Progress Note  Patient Name: [name] Date of Encounter: 05/19/2020  [facility] Cardiologist: Peter [name], MD   Subjective   No acute overnight events. Patient denies any shortness of breath at rest but states she has not been up yet. No chest pain. Occasional palpitations with her atrial fibrillation with no lightheadedness/dizziness. She is very eager to have DCCV so she can go home.  Inpatient Medications    Scheduled Meds: . buPROPion  150 mg Oral Daily  . furosemide  80 mg Intravenous BID  . levothyroxine  100 mcg Oral QAC breakfast  . losartan  12.5 mg Oral Daily  . potassium chloride  40 mEq Oral Daily  . Rivaroxaban  15 mg Oral Q supper  . sodium chloride flush  3 mL Intravenous Q12H   Continuous Infusions: . sodium chloride 250 mL (05/18/20 0949)  . amiodarone 30 mg/hr (05/18/20 2317)   PRN Meds: sodium chloride, acetaminophen, ondansetron (ZOFRAN) IV, sodium chloride flush   Vital Signs    Vitals:   05/19/20 0009 05/19/20 0544 05/19/20 0600 05/19/20 0817  BP: 114/84 (!) 119/93 107/82 130/76  Pulse: 98 (!) 122 (!) 110 (!) 114  Resp: 18   17  Temp: 97.7 F (36.5 C) 97.8 F (36.6 C) 97.8 F (36.6 C) 97.8 F (36.6 C)  TempSrc: Oral Oral Oral Oral  SpO2: 94% 94% 96% 95%  Weight:  59.2 kg      Intake/Output Summary (Last 24 hours) at 05/19/2020 0910 Last data filed at 05/18/2020 2200 Gross per 24 hour  Intake 361.89 ml  Output 3310 ml  Net -2948.11 ml   Last 3 Weights 05/19/2020 05/18/2020 05/17/2020  Weight (lbs) 130 lb 8.2 oz 133 lb 9.6 oz 146 lb 2.6 oz  Weight (kg) 59.2 kg 60.6 kg 66.3 kg      Telemetry    Atrial fibrillation with rates in the 100's to 110's. - Personally Reviewed  ECG    No new ECG tracing today. - Personally Reviewed  Physical Exam   GEN: No acute distress.   Neck: JVD slightly above clavicle with patient sitting upright. Cardiac: Tachycardic with irregularly irregular rhythm. No murmurs, rubs, or gallops.   Respiratory: Decreased breath sounds in bases. No significant crackles appreciated. GI: Soft, non-distended, and non-tender. MS: No lower extremity edema. No deformity. Skin: Warm and dry. Neuro:  No focal deficits. Psych: Normal affect. Responds appropriately.  Labs    High Sensitivity Troponin:  No results for input(s): TROPONINIHS in the last 720 hours.    Chemistry Recent Labs  Lab 05/17/20 0259 05/18/20 0459 05/19/20 0241  NA 137 139 141  K 3.5 3.5 3.6  CL 109 109 104  CO2 18* 23 27  GLUCOSE 100* 97 111*  BUN 32* 29* 29*  CREATININE 0.97 1.08* 1.03*  CALCIUM 8.9 8.5* 8.5*  GFRNONAA 57* 50* 53*  ANIONGAP 10 7 10      Hematology Recent Labs  Lab 05/16/20 1819 05/19/20 0241  WBC 7.4 9.3  RBC 3.73* 3.83*  HGB 10.8* 11.3*  HCT 34.7* 34.5*  MCV 93.0 90.1  MCH 29.0 29.5  MCHC 31.1 32.8  RDW 13.2 13.0  PLT 216 213    BNP Recent Labs  Lab 05/16/20 1819  BNP 1,299.8*     DDimer No results for input(s): DDIMER in the last 168 hours.   Radiology    ECHOCARDIOGRAM COMPLETE  Result Date: 05/17/2020    ECHOCARDIOGRAM REPORT   Patient Name:   [name] Date  of Exam: 05/17/2020 Medical Rec #:  [id number]       Height:       68.0 in Accession #:    [id number]      Weight:       146.2 lb Date of Birth:  03-24-1932        BSA:          1.789 m Patient Age:    87 years        BP:           106/77 mmHg Patient Gender: F               HR:           88 bpm. Exam Location:  Inpatient Procedure: 2D Echo, Cardiac Doppler and Color Doppler Indications:    CHF  History:        Patient has prior history of Echocardiogram examinations, most                 recent 06/29/2019. CHF and Cardiomyopathy, Defibrillator,                 Arrythmias:Atrial Fibrillation; Risk Factors:Hypertension.  Sonographer:    Dustin Flock Referring Phys: [place]  1. Left ventricular ejection fraction, by estimation, is 25 to 30%. The left ventricle has severely decreased  function. The left ventricle demonstrates global hypokinesis. Left ventricular diastolic function could not be evaluated.  2. Right ventricular systolic function is mildly reduced. The right ventricular size is mildly enlarged. There is moderately elevated pulmonary artery systolic pressure. The estimated right ventricular systolic pressure is 17.4 mmHg.  3. Left atrial size was severely dilated.  4. Right atrial size was mildly dilated.  5. A small pericardial effusion is present. The pericardial effusion is circumferential.  6. The mitral valve is grossly normal. Moderate to severe mitral valve regurgitation. No evidence of mitral stenosis.  7. Tricuspid valve regurgitation is severe.  8. The aortic valve is tricuspid. Aortic valve regurgitation is not visualized. No aortic stenosis is present.  9. The inferior vena cava is dilated in size with <50% respiratory variability, suggesting right atrial pressure of 15 mmHg. Comparison(s): Changes from prior study are noted. EF now severely reduced. Moderately dilated RV. Moderate to severe MR. Severe TR. RVSP ~49 mmHG. FINDINGS  Left Ventricle: Left ventricular ejection fraction, by estimation, is 25 to 30%. The left ventricle has severely decreased function. The left ventricle demonstrates global hypokinesis. The left ventricular internal cavity size was normal in size. There is no left ventricular hypertrophy. Abnormal (paradoxical) septal motion, consistent with left bundle branch block. Left ventricular diastolic function could not be evaluated due to atrial fibrillation. Left ventricular diastolic function could not be evaluated. Right Ventricle: The right ventricular size is mildly enlarged. No increase in right ventricular wall thickness. Right ventricular systolic function is mildly reduced. There is moderately elevated pulmonary artery systolic pressure. The tricuspid regurgitant velocity is 2.91 m/s, and with an assumed right atrial pressure of 15 mmHg, the  estimated right ventricular systolic pressure is 94.4 mmHg. Left Atrium: Left atrial size was severely dilated. Right Atrium: Right atrial size was mildly dilated. Pericardium: A small pericardial effusion is present. The pericardial effusion is circumferential. Mitral Valve: The mitral valve is grossly normal. Moderate to severe mitral valve regurgitation. No evidence of mitral valve stenosis. Tricuspid Valve: The tricuspid valve is grossly normal. Tricuspid valve regurgitation is severe. No evidence of tricuspid stenosis. Aortic Valve: The aortic valve  is tricuspid. Aortic valve regurgitation is not visualized. No aortic stenosis is present. Pulmonic Valve: The pulmonic valve was grossly normal. Pulmonic valve regurgitation is trivial. No evidence of pulmonic stenosis. Aorta: The aortic root is normal in size and structure. Venous: The inferior vena cava is dilated in size with less than 50% respiratory variability, suggesting right atrial pressure of 15 mmHg. IAS/Shunts: The atrial septum is grossly normal. Additional Comments: A device lead is visualized in the right atrium and right ventricle. There is a small pleural effusion in the left lateral region.  LEFT VENTRICLE PLAX 2D LVIDd:         5.30 cm  Diastology LVIDs:         4.40 cm  LV e' medial:    8.05 cm/s LV PW:         1.00 cm  LV E/e' medial:  13.5 LV IVS:        1.00 cm  LV e' lateral:   11.10 cm/s LVOT diam:     2.20 cm  LV E/e' lateral: 9.8 LV SV:         63 LV SV Index:   35 LVOT Area:     3.80 cm  RIGHT VENTRICLE RV Basal diam:  2.80 cm RV S prime:     5.55 cm/s TAPSE (M-mode): 2.4 cm LEFT ATRIUM              Index       RIGHT ATRIUM           Index LA diam:        4.20 cm  2.35 cm/m  RA Area:     19.10 cm LA Vol (A2C):   54.5 ml  30.47 ml/m RA Volume:   51.40 ml  28.74 ml/m LA Vol (A4C):   103.0 ml 57.58 ml/m LA Biplane Vol: 81.3 ml  45.45 ml/m  AORTIC VALVE LVOT Vmax:   101.00 cm/s LVOT Vmean:  59.300 cm/s LVOT VTI:    0.166 m  AORTA Ao  Root diam: 3.30 cm MITRAL VALVE                TRICUSPID VALVE MV Area (PHT): 7.16 cm     TR Peak grad:   33.9 mmHg MV Decel Time: 106 msec     TR Vmax:        291.00 cm/s MV E velocity: 109.00 cm/s MV A velocity: 35.00 cm/s   SHUNTS MV E/A ratio:  3.11         Systemic VTI:  0.17 m                             Systemic Diam: 2.20 cm Eleonore Chiquito MD Electronically signed by Eleonore Chiquito MD Signature Date/Time: 05/17/2020/1:48:27 PM    Final     Cardiac Studies   Echocardiogram 05/17/2020: Impressions: 1. Left ventricular ejection fraction, by estimation, is 25 to 30%. The  left ventricle has severely decreased function. The left ventricle  demonstrates global hypokinesis. Left ventricular diastolic function could  not be evaluated.  2. Right ventricular systolic function is mildly reduced. The right  ventricular size is mildly enlarged. There is moderately elevated  pulmonary artery systolic pressure. The estimated right ventricular  systolic pressure is 27.0 mmHg.  3. Left atrial size was severely dilated.  4. Right atrial size was mildly dilated.  5. A small pericardial effusion is present. The pericardial effusion is  circumferential.  6. The mitral valve is grossly normal. Moderate to severe mitral valve  regurgitation. No evidence of mitral stenosis.  7. Tricuspid valve regurgitation is severe.  8. The aortic valve is tricuspid. Aortic valve regurgitation is not  visualized. No aortic stenosis is present.  9. The inferior vena cava is dilated in size with <50% respiratory  variability, suggesting right atrial pressure of 15 mmHg.   Comparison(s): Changes from prior study are noted. EF now severely  reduced. Moderately dilated RV. Moderate to severe MR. Severe TR. RVSP ~49  mmHG.   Patient Profile     87 y.o. female with a history of non-ischemic cardiomyopathy with normalization of EF on last Echo in 06/2019, s/p BiV ICD, persistent atrial fibrillation on Xarelto, LBBB,  hypertension, and hypothyroidism who was admitted on 05/16/2020 for acute CHF and atrial fibrillation with RVR.  Assessment & Plan    Acute on Chronic Combined CHF Non-Ischemic Cardiomyopathy - BNP elevated at 1,299. - Chest x-ray consistent with CHF with mild pulmonary edema and small bilateral pleural effusions. - Echo showed LVEF of 25-30% (down from 55-60% in 06/2019) with global hypokinesis and moderate to severe MR. RV mildly enlarged with mildly reduced systolic function, severe TR, and moderately elevated PASP. Also showed small pericardial effusion. - Currently on IV Lasix 80mg  twice daily. Documented urinary output of 3.7 L yesterday and net negative 5.37 L since admission. Weight down 3lbs from yesterday and 16 lbs from admission if documented weights are right. Renal function stable. - Continue current dose of Lasix. May be able to switch to PO Lasix later today or tomorrow. - Continue Losartan 12.5mg  daily.  - Beta-blocker initially  held in setting of borderline pressures. However, BP has improved some so think we can likely start low dose Lopressor. Will discuss with MD. - Can continue to add GDMT once back in sinus rhythm if BP will allow. - Continue to monitor daily weight, strict I/Os, and renal function. - Drop in EF felt to be secondary to atrial fibrillation with RVR. Given advance age, it was recommended to treat atrial fibrillation and heart failure medically and then reassess EF in 3 months. No plans for ischemic evaluation a this time. Can consider if EF does not recover with medical therapy.  Persistent Atrial Fibrillation - Rates in the 100's to 110's. - TSH normal. - Electrolytes within normal limits. - Initially started on Diltiazem but this was stopped due to reduced EF.  - Continue IV Amiodarone. - Received one dose of Digoxin but then this was stopped due to need for Amiodarone. - - Beta-blocker initially  held in setting of borderline pressures. However, BP has  improved some so think we can likely start low dose Lopressor. Will discuss with MD. - CHA2DS2-VASc = 5 (CHF, HTN, age x2, female). Continue Xarelto 15mg  daily.  S/p [employer] BiV ICD - Followed by Dr. Lovena Le.  Hypertension - BP soft at times but stable. - Continue CHF medications as above.  Hypothyroidism - TSH normal. - Continue Synthroid.   For questions or updates, please contact [place] Please consult [email or web address] for contact info under    Signed, Darreld Mclean, PA-C  05/19/2020, 9:10 AM    The patient was seen, examined and discussed with Darreld Mclean, PA-C  and I agree with the above.   The patient underwent a successful cardioversion this am, remains in SR with ventricular rates in 25' and is feeling well. No signs of fluid overload. She diuresed 2.9 L  overnight. We will continue iv lasix today and switch to lasix 40 mg po daily on discharge tomorrow and plan for a follow up in our clinic in 5-10 days with repeat BMP and BNP. Crea is stable at 1.0 today. Continue Xarelto 15 mg po daily for anticoagulation. Hb 11.3. Continue losartan 12.5 mg po daily, start Toprol XL 25 mg po daily.  Ena Dawley, MD 05/19/2020

## 2020-05-19 NOTE — Transfer of Care (Signed)
Immediate Anesthesia Transfer of Care Note  Patient: [name]  Procedure(s) Performed: CARDIOVERSION (N/A )  Patient Location: Endoscopy Unit  Anesthesia Type:MAC  Level of Consciousness: drowsy and patient cooperative  Airway & Oxygen Therapy: Patient Spontanous Breathing and Patient connected to nasal cannula oxygen  Post-op Assessment: Report given to RN and Post -op Vital signs reviewed and stable  Post vital signs: Reviewed and stable  Last Vitals:  Vitals Value Taken Time  BP 113/62   Temp    Pulse 55 05/19/20 1106  Resp 18 05/19/20 1106  SpO2 96 % 05/19/20 1106    Last Pain:  Vitals:   05/19/20 1021  TempSrc: Oral  PainSc: 0-No pain      Patients Stated Pain Goal: 0 (73/73/66 8159)  Complications: No complications documented.

## 2020-05-19 NOTE — CV Procedure (Signed)
    Electrical Cardioversion Procedure Note [name] [id number] 1932/06/09  Procedure: Electrical Cardioversion Indications:  Atrial Fibrillation  Time Out: Verified patient identification, verified procedure,medications/allergies/relevent history reviewed, required imaging and test results available.  Performed  Procedure Details  The patient was NPO after midnight. Anesthesia was administered at the beside with propofol.  Cardioversion was performed with synchronized biphasic defibrillation via AP pads with 200 joules.  1 attempt(s) were performed.  The patient converted to normal sinus rhythm. The patient tolerated the procedure well   IMPRESSION:  Successful cardioversion of atrial fibrillation with IV amiodarone. AV pacing noted then AS VP.   Will stop IV amiodarone.   [name] 05/19/2020, 11:13 AM

## 2020-05-19 NOTE — Interval H&P Note (Signed)
History and Physical Interval Note:  05/19/2020 10:36 AM  [name]  has presented today for surgery, with the diagnosis of afib.  The various methods of treatment have been discussed with the patient and family. After consideration of risks, benefits and other options for treatment, the patient has consented to  Procedure(s): CARDIOVERSION (N/A) as a surgical intervention.  The patient's history has been reviewed, patient examined, no change in status, stable for surgery.  I have reviewed the patient's chart and labs.  Questions were answered to the patient's satisfaction.     [employer]

## 2020-05-19 NOTE — Anesthesia Postprocedure Evaluation (Signed)
Anesthesia Post Note  Patient: [name]  Procedure(s) Performed: CARDIOVERSION (N/A )     Patient location during evaluation: PACU Anesthesia Type: General Level of consciousness: awake and alert and oriented Pain management: pain level controlled Vital Signs Assessment: post-procedure vital signs reviewed and stable Respiratory status: spontaneous breathing, nonlabored ventilation and respiratory function stable Cardiovascular status: blood pressure returned to baseline Postop Assessment: no apparent nausea or vomiting Anesthetic complications: no   No complications documented.  Last Vitals:  Vitals:   05/19/20 1107 05/19/20 1117  BP: (!) 115/56 116/61  Pulse: (!) 53 (!) 56  Resp: 16 20  Temp: (!) 36.4 C   SpO2: 96% 97%    Last Pain:  Vitals:   05/19/20 1117  TempSrc:   PainSc: 0-No pain                 Brennan Bailey

## 2020-05-20 ENCOUNTER — Encounter (HOSPITAL_COMMUNITY): Payer: Self-pay | Admitting: Cardiology

## 2020-05-20 ENCOUNTER — Other Ambulatory Visit: Payer: Self-pay | Admitting: Student

## 2020-05-20 DIAGNOSIS — Z950 Presence of cardiac pacemaker: Secondary | ICD-10-CM

## 2020-05-20 DIAGNOSIS — I509 Heart failure, unspecified: Secondary | ICD-10-CM

## 2020-05-20 DIAGNOSIS — I10 Essential (primary) hypertension: Secondary | ICD-10-CM

## 2020-05-20 LAB — MAGNESIUM: Magnesium: 2 mg/dL (ref 1.7–2.4)

## 2020-05-20 LAB — BASIC METABOLIC PANEL
Anion gap: 7 (ref 5–15)
BUN: 25 mg/dL — ABNORMAL HIGH (ref 8–23)
CO2: 33 mmol/L — ABNORMAL HIGH (ref 22–32)
Calcium: 8.9 mg/dL (ref 8.9–10.3)
Chloride: 99 mmol/L (ref 98–111)
Creatinine, Ser: 1.11 mg/dL — ABNORMAL HIGH (ref 0.44–1.00)
GFR, Estimated: 49 mL/min — ABNORMAL LOW (ref >60)
Glucose, Bld: 101 mg/dL — ABNORMAL HIGH (ref 70–99)
Potassium: 3.5 mmol/L (ref 3.5–5.1)
Sodium: 139 mmol/L (ref 135–145)

## 2020-05-20 MED ORDER — METOPROLOL SUCCINATE ER 25 MG PO TB24: 25.0000 mg | ORAL_TABLET | Freq: Every day | ORAL | 2 refills | Status: DC

## 2020-05-20 MED ORDER — LOSARTAN POTASSIUM 25 MG PO TABS: 25.0000 mg | ORAL_TABLET | Freq: Every day | ORAL | 2 refills | Status: DC

## 2020-05-20 MED ORDER — FUROSEMIDE 40 MG PO TABS: 40.0000 mg | ORAL_TABLET | Freq: Every day | ORAL | 2 refills | Status: DC

## 2020-05-20 MED ORDER — AMIODARONE HCL 200 MG PO TABS: 200.0000 mg | ORAL_TABLET | Freq: Every day | ORAL | Status: DC

## 2020-05-20 MED ORDER — FUROSEMIDE 40 MG PO TABS: 40.0000 mg | ORAL_TABLET | Freq: Every day | ORAL | Status: DC

## 2020-05-20 MED ORDER — AMIODARONE HCL 200 MG PO TABS
200.0000 mg | ORAL_TABLET | Freq: Two times a day (BID) | ORAL | Status: DC
  Administered 2020-05-20: 200 mg via ORAL
  Filled 2020-05-20: qty 1

## 2020-05-20 MED ORDER — POTASSIUM CHLORIDE CRYS ER 20 MEQ PO TBCR: 20.0000 meq | EXTENDED_RELEASE_TABLET | Freq: Every day | ORAL | 2 refills | Status: DC

## 2020-05-20 MED ORDER — AMIODARONE HCL 200 MG PO TABS: ORAL_TABLET | ORAL | 2 refills | Status: DC

## 2020-05-20 MED ORDER — POTASSIUM CHLORIDE CRYS ER 20 MEQ PO TBCR
20.0000 meq | EXTENDED_RELEASE_TABLET | Freq: Once | ORAL | Status: AC
  Administered 2020-05-20: 20 meq via ORAL
  Filled 2020-05-20: qty 1

## 2020-05-20 NOTE — Plan of Care (Signed)
  Problem: Clinical Measurements: Goal: Ability to maintain clinical measurements within normal limits will improve Outcome: Completed/Met Goal: Will remain free from infection Outcome: Completed/Met Goal: Diagnostic test results will improve Outcome: Completed/Met Goal: Respiratory complications will improve Outcome: Completed/Met Goal: Cardiovascular complication will be avoided Outcome: Completed/Met   Problem: Health Behavior/Discharge Planning: Goal: Ability to manage health-related needs will improve Outcome: Completed/Met   Problem: Education: Goal: Knowledge of General Education information will improve Description: Including pain rating scale, medication(s)/side effects and non-pharmacologic comfort measures Outcome: Completed/Met   

## 2020-05-20 NOTE — Care Management Important Message (Signed)
Important Message  Patient Details  Name: [name] MRN: [id number] Date of Birth: May 15, 1932   Medicare Important Message Given:  Yes     Shelda Altes 05/20/2020, 9:16 AM

## 2020-05-20 NOTE — Discharge Summary (Signed)
Discharge Summary    Patient ID: [name] MRN: [id number]; DOB: Jun 27, 1932  Admit date: 05/16/2020 Discharge date: 05/20/2020  PCP:  Gayland Curry, [place]  Cardiologist:  Peter [name], MD  Electrophysiologist:  None   Discharge Diagnoses    Principal Problem:   Acute on chronic systolic CHF (congestive heart failure) ([place]) Active Problems:   Cardiomyopathy, dilated, nonischemic (HCC)   Biventricular cardiac pacemaker in situ   Persistent atrial fibrillation (HCC)   HTN (hypertension)   PVC (premature ventricular contraction)   Hypothyroidism   Acute on chronic congestive heart failure [facility])    Diagnostic Studies/Procedures    Echocardiogram 05/17/2020: Impressions: 1. Left ventricular ejection fraction, by estimation, is 25 to 30%. The  left ventricle has severely decreased function. The left ventricle  demonstrates global hypokinesis. Left ventricular diastolic function could  not be evaluated.  2. Right ventricular systolic function is mildly reduced. The right  ventricular size is mildly enlarged. There is moderately elevated  pulmonary artery systolic pressure. The estimated right ventricular  systolic pressure is 20.9 mmHg.  3. Left atrial size was severely dilated.  4. Right atrial size was mildly dilated.  5. A small pericardial effusion is present. The pericardial effusion is  circumferential.  6. The mitral valve is grossly normal. Moderate to severe mitral valve  regurgitation. No evidence of mitral stenosis.  7. Tricuspid valve regurgitation is severe.  8. The aortic valve is tricuspid. Aortic valve regurgitation is not  visualized. No aortic stenosis is present.  9. The inferior vena cava is dilated in size with <50% respiratory  variability, suggesting right atrial pressure of 15 mmHg.   Comparison(s): Changes from prior study are noted. EF now severely  reduced. Moderately dilated RV. Moderate to  severe MR. Severe TR. RVSP ~49  mmHG.  _____________  DCCV 05/19/2020: Impressions: Successful cardioversion of atrial fibrillation with IV amiodarone. AV pacing noted then AS VP.   Will stop IV amiodarone.    History of Present Illness     [name] is a 87 y.o. female with a history of presumed non-ischemic cardiomyopathy with chronic systolic CHF s/p BiV ICD which resulted in improvement in EF, LBBB, hypertension, hypothyroidism, melanoma, and anxiety/depression. She was seen by Kerin Ransom, PA-C, on 04/28/2020 with complaints of shortness of breath, edema, and fatigue. She was found to be in atrial fibrillation with rates in the 120's at that time. She underwent successful DCCV on 05/06/2020. She was then seen by Dr. [name] in the office on 05/16/2020 for follow-up at which time she was not doing well. She was back in atrial fibrillation with RVR. She had marked edema with weeping of her lower extremities. She reported significant fatigue and some shortness of breath. She was not aware of her rhythm but reported no improvement after her cardioversion. She had only been taking half of her Metoprolol and she hadn't missed any doses of her Xarelto. She was advised to go to the ED for admission for IV diuresis and rate control   Hospital Course     Consultants: None  Acute on Chronic Combined CHF  Non-Ischemic Cardiomyopathy  Patient admitted from the office with acute on chronic CHF felt to be secondary to atrial fibrillation with RVR. BNP elevated at 1,299. Chest x-ray consistent with CHF with mild pulmonary edema and small bilateral pleural effusions. Echo showed LVEF of 25-30% (down from 55-60% in 06/2019) with global hypokinesis and moderate to severe MR. RV  mildly enlarged with mildly reduced systolic function, severe TR, and moderately elevated PASP. Also showed small pericardial effusion. Patient was diuresed with IV Lasix. Net negative 8.078 L during admission. Discharge weight 117lb  but this may not be accurate (was 130.5lbs on 05/19/2020). Will discharge patient on Lasix 40mg  daily, Losartan 25mg  daily (rather than home Trandolapril), and Toprol-XL 25mg  daily (rather than home Lopressor). Will hold off on Spironolactone at this time given soft BP at times. Patient also does not want to be on a lot of new medications. Can consider adding as outpatient if BP allows and patient is agreeable. Drop in EF felt to be secondary to atrial fibrillation with RVR. Given advance age, it was recommended to treat atrial fibrillation and heart failure medically and then reassess EF in 3 months. No ischemic evaluation felt to be necessary this admission. Can consider if EF does not recover with medical therapy.    Persistent Atrial Fibrillation  Patient was initially started on IV Diltiazem, but this was stopped due to reduced EF and patient was started on IV Amiodarone. TSH normal and electrolytes were within normal limits. Patient had not missed any dose of anticoagulation and underwent successful DCCV on 3/14. Patient maintained sinus rhythm for remainder of hospitalization. Will continue Toprol-XL 25mg  daily at discharge (rather than home Lopressor). Continue Xarelto 15mg  daily.    S/p [employer] BiV ICD  Patient has ICD for primary prevention. Followed by Dr. Lovena Le.    Hypertension  BP soft at times but stable during this admission. Continue CHF medications as above.    Hypothyroidism  TSH normal. Continue home Synthroid.   Patient seen and examined by Dr. Meda Coffee today and determined to be stable for discharge. Outpatient follow-up has been arranged. Medications as below.    Did the patient have an acute coronary syndrome (MI, NSTEMI, STEMI, etc) this admission?:  No                               Did the patient have a percutaneous coronary intervention (stent / angioplasty)?:  No.       _____________  Discharge Vitals Blood pressure 129/70, pulse (!) 56, temperature  98.1 F (36.7 C), temperature source Oral, resp. rate 18, height 5\' 8"  (1.727 m), weight 53.1 kg, SpO2 98 %.  Filed Weights   05/19/20 0544 05/19/20 1021 05/20/20 0518  Weight: 59.2 kg 59.2 kg 53.1 kg   General: 87 y.o. female resting comfortably in no acute distress. HEENT: Normocephalic and atraumatic. Sclera clear.  Neck: Supple. No JVD. Heart: RRR. Distinct S1 and S2. No murmurs, gallops, or rubs. Radial pulses 2+ and equal bilaterally. Lungs: No increased work of breathing. Clear to ausculation bilaterally. No wheezes, rhonchi, or rales.  Abdomen: Soft, non-distended, and non-tender to palpation.  MSK: Normal strength and tone for age. Extremities: No lower extremity edema.    Skin: Warm and dry. Neuro: Alert and oriented x3. No focal deficits. Psych: Normal affect. Responds appropriately.  Labs & Radiologic Studies    CBC Recent Labs    05/19/20 0241  WBC 9.3  NEUTROABS 6.6  HGB 11.3*  HCT 34.5*  MCV 90.1  PLT 086   Basic Metabolic Panel Recent Labs    05/19/20 0241 05/20/20 0227  NA 141 139  K 3.6 3.5  CL 104 99  CO2 27 33*  GLUCOSE 111* 101*  BUN 29* 25*  CREATININE 1.03* 1.11*  CALCIUM 8.5* 8.9  MG 2.1 2.0   Liver Function Tests No results for input(s): AST, ALT, ALKPHOS, BILITOT, PROT, ALBUMIN in the last 72 hours. No results for input(s): LIPASE, AMYLASE in the last 72 hours. High Sensitivity Troponin:   No results for input(s): TROPONINIHS in the last 720 hours.  BNP Invalid input(s): POCBNP D-Dimer No results for input(s): DDIMER in the last 72 hours. Hemoglobin A1C No results for input(s): HGBA1C in the last 72 hours. Fasting Lipid Panel No results for input(s): CHOL, HDL, LDLCALC, TRIG, CHOLHDL, LDLDIRECT in the last 72 hours. Thyroid Function Tests No results for input(s): TSH, T4TOTAL, T3FREE, THYROIDAB in the last 72 hours.  Invalid input(s): FREET3 _____________  DG Chest 2 View  Result Date: 05/16/2020 CLINICAL DATA:  Shortness of  breath.  CHF exacerbation. EXAM: CHEST - 2 VIEW COMPARISON:  Radiograph 03/30/2019 FINDINGS: Multi lead left-sided pacemaker in place. Lower lung volumes from prior exam. Mild cardiomegaly. Small bilateral pleural effusions. Associated compressive atelectasis. Mild interstitial thickening consistent with pulmonary edema. No pneumothorax or confluent airspace disease. Surgical clips in the left axilla. IMPRESSION: Congestive heart failure with mild pulmonary edema and small bilateral pleural effusions. Electronically Signed   By: Keith Rake M.D.   On: 05/16/2020 19:18   ECHOCARDIOGRAM COMPLETE  Result Date: 05/17/2020    ECHOCARDIOGRAM REPORT   Patient Name:   FIZA NATION Date of Exam: 05/17/2020 Medical Rec #:  [id number]       Height:       68.0 in Accession #:    [id number]      Weight:       146.2 lb Date of Birth:  1932-09-30        BSA:          1.789 m Patient Age:    3 years        BP:           106/77 mmHg Patient Gender: F               HR:           88 bpm. Exam Location:  Inpatient Procedure: 2D Echo, Cardiac Doppler and Color Doppler Indications:    CHF  History:        Patient has prior history of Echocardiogram examinations, most                 recent 06/29/2019. CHF and Cardiomyopathy, Defibrillator,                 Arrythmias:Atrial Fibrillation; Risk Factors:Hypertension.  Sonographer:    Dustin Flock Referring Phys: [place]  1. Left ventricular ejection fraction, by estimation, is 25 to 30%. The left ventricle has severely decreased function. The left ventricle demonstrates global hypokinesis. Left ventricular diastolic function could not be evaluated.  2. Right ventricular systolic function is mildly reduced. The right ventricular size is mildly enlarged. There is moderately elevated pulmonary artery systolic pressure. The estimated right ventricular systolic pressure is 96.2 mmHg.  3. Left atrial size was severely dilated.  4. Right atrial size was  mildly dilated.  5. A small pericardial effusion is present. The pericardial effusion is circumferential.  6. The mitral valve is grossly normal. Moderate to severe mitral valve regurgitation. No evidence of mitral stenosis.  7. Tricuspid valve regurgitation is severe.  8. The aortic valve is tricuspid. Aortic valve regurgitation is not visualized. No aortic stenosis is present.  9. The inferior vena cava is dilated in size with <  50% respiratory variability, suggesting right atrial pressure of 15 mmHg. Comparison(s): Changes from prior study are noted. EF now severely reduced. Moderately dilated RV. Moderate to severe MR. Severe TR. RVSP ~49 mmHG. FINDINGS  Left Ventricle: Left ventricular ejection fraction, by estimation, is 25 to 30%. The left ventricle has severely decreased function. The left ventricle demonstrates global hypokinesis. The left ventricular internal cavity size was normal in size. There is no left ventricular hypertrophy. Abnormal (paradoxical) septal motion, consistent with left bundle branch block. Left ventricular diastolic function could not be evaluated due to atrial fibrillation. Left ventricular diastolic function could not be evaluated. Right Ventricle: The right ventricular size is mildly enlarged. No increase in right ventricular wall thickness. Right ventricular systolic function is mildly reduced. There is moderately elevated pulmonary artery systolic pressure. The tricuspid regurgitant velocity is 2.91 m/s, and with an assumed right atrial pressure of 15 mmHg, the estimated right ventricular systolic pressure is 38.2 mmHg. Left Atrium: Left atrial size was severely dilated. Right Atrium: Right atrial size was mildly dilated. Pericardium: A small pericardial effusion is present. The pericardial effusion is circumferential. Mitral Valve: The mitral valve is grossly normal. Moderate to severe mitral valve regurgitation. No evidence of mitral valve stenosis. Tricuspid Valve: The tricuspid  valve is grossly normal. Tricuspid valve regurgitation is severe. No evidence of tricuspid stenosis. Aortic Valve: The aortic valve is tricuspid. Aortic valve regurgitation is not visualized. No aortic stenosis is present. Pulmonic Valve: The pulmonic valve was grossly normal. Pulmonic valve regurgitation is trivial. No evidence of pulmonic stenosis. Aorta: The aortic root is normal in size and structure. Venous: The inferior vena cava is dilated in size with less than 50% respiratory variability, suggesting right atrial pressure of 15 mmHg. IAS/Shunts: The atrial septum is grossly normal. Additional Comments: A device lead is visualized in the right atrium and right ventricle. There is a small pleural effusion in the left lateral region.  LEFT VENTRICLE PLAX 2D LVIDd:         5.30 cm  Diastology LVIDs:         4.40 cm  LV e' medial:    8.05 cm/s LV PW:         1.00 cm  LV E/e' medial:  13.5 LV IVS:        1.00 cm  LV e' lateral:   11.10 cm/s LVOT diam:     2.20 cm  LV E/e' lateral: 9.8 LV SV:         63 LV SV Index:   35 LVOT Area:     3.80 cm  RIGHT VENTRICLE RV Basal diam:  2.80 cm RV S prime:     5.55 cm/s TAPSE (M-mode): 2.4 cm LEFT ATRIUM              Index       RIGHT ATRIUM           Index LA diam:        4.20 cm  2.35 cm/m  RA Area:     19.10 cm LA Vol (A2C):   54.5 ml  30.47 ml/m RA Volume:   51.40 ml  28.74 ml/m LA Vol (A4C):   103.0 ml 57.58 ml/m LA Biplane Vol: 81.3 ml  45.45 ml/m  AORTIC VALVE LVOT Vmax:   101.00 cm/s LVOT Vmean:  59.300 cm/s LVOT VTI:    0.166 m  AORTA Ao Root diam: 3.30 cm MITRAL VALVE  TRICUSPID VALVE MV Area (PHT): 7.16 cm     TR Peak grad:   33.9 mmHg MV Decel Time: 106 msec     TR Vmax:        291.00 cm/s MV E velocity: 109.00 cm/s MV A velocity: 35.00 cm/s   SHUNTS MV E/A ratio:  3.11         Systemic VTI:  0.17 m                             Systemic Diam: 2.20 cm Eleonore Chiquito MD Electronically signed by Eleonore Chiquito MD Signature Date/Time:  05/17/2020/1:48:27 PM    Final    CUP PACEART INCLINIC DEVICE CHECK  Result Date: 05/02/2020 CRT-P device check in clinic. Normal device function. Thresholds, sensing, impedance consistent with previous measurements. Histograms appropriate for patient and level of activity. AT/Af burden 19%, ongoing AF, scheduled for DCCV 05/06/20, + Xarelto.  with episodes of RVR. 63 ventricular high rate episodes available EGMs show AF with RVR. Patient bi-ventricularly pacing 69% of the time. Device programmed with appropriate safety margins. Device heart failure diagnostics are within normal limits and stable over time. Estimated longevity 9.5 years. Patient enrolled in remote follow-up/TTM's with [employer]. Plan to check device remotely in 3 months and every 6 months in office. Metoprolol increased  by Dr. Lovena Le to 37.5 mg am and 25 mg pm.  Disposition   Patient is being discharged home today in good condition.  Follow-up Plans & Appointments     Follow-up Information    Darreld Mclean, PA-C Follow up.   Specialties: Physician Assistant, Cardiology Why: Hospital follow-up with Cardiology scheduled for 06/06/2020 at 10:15am. Please arrive 15 minutes early for check-in. If this date/time does not work for you, please call our office to reschedule. Contact information: [place] [address] [phone]                Discharge Medications   Allergies as of 05/20/2020      Reactions   Hydrocodone Itching   Cephalexin Itching, Swelling   Ciprofloxacin Other (See Comments)   Not indicated due to aneurysm in aorta     Doxycycline Other (See Comments)   Unknown reaction   Escitalopram Other (See Comments)   Dry mouth   Levofloxacin Other (See Comments)   Not indicated due to aneurysm in aorta    Moxifloxacin Other (See Comments)   Not indicated due to aneurysm in aorta    Ofloxacin Other (See Comments)   Unknown reaction   Sertraline Other (See Comments)    insomnia   Sulfamethoxazole Hives   Any sulfa meds.   Tape Other (See Comments)   Burns skin.    Latex Rash   "If pt. Makes contact with or wearing"   Neomycin Rash      Medication List    STOP taking these medications   metoprolol tartrate 25 MG tablet Commonly known as: LOPRESSOR   trandolapril 2 MG tablet Commonly known as: MAVIK     TAKE these medications   acetaminophen 500 MG tablet Commonly known as: TYLENOL Take 1,000 mg by mouth every 8 (eight) hours as needed for moderate pain or mild pain.   amiodarone 200 MG tablet Commonly known as: PACERONE Take 1 tablet (200mg ) twice daily for 5 days. Then on 05/25/2020, reduce to 1 tablet (200mg ) once daily.   Biotin 5000 MCG Tabs Take 5,000 mcg by mouth daily.  buPROPion 150 MG 24 hr tablet Commonly known as: WELLBUTRIN XL TAKE 1 TABLET BY MOUTH EVERY DAY   CALCIUM + D PO Take 2 tablets by mouth daily. 1000 units Vitamin D 1200 mg calcium   estradiol 0.1 MG/GM vaginal cream Commonly known as: ESTRACE Place 1 g vaginally 2 (two) times a week.   Ferrex 150 150 MG capsule Generic drug: iron polysaccharides TAKE 1 CAPSULE BY MOUTH EVERY DAY What changed: how much to take   fexofenadine-pseudoephedrine 60-120 MG 12 hr tablet Commonly known as: ALLEGRA-D Take 1 tablet by mouth daily as needed (Allergies).   furosemide 40 MG tablet Commonly known as: LASIX Take 1 tablet (40 mg total) by mouth daily. What changed:   medication strength  how much to take  when to take this  reasons to take this   losartan 25 MG tablet Commonly known as: COZAAR Take 1 tablet (25 mg total) by mouth daily.   metoprolol succinate 25 MG 24 hr tablet Commonly known as: TOPROL-XL Take 1 tablet (25 mg total) by mouth daily.   potassium chloride SA 20 MEQ tablet Commonly known as: KLOR-CON Take 1 tablet (20 mEq total) by mouth daily.   RABEprazole 20 MG tablet Commonly known as: ACIPHEX Take one tablet by mouth once daily  for stomach What changed:   how much to take  how to take this  when to take this  additional instructions   Rivaroxaban 15 MG Tabs tablet Commonly known as: Xarelto TAKE 1 TABLET DAILY WITH   SUPPER (DOSE REDUCED) What changed:   how much to take  how to take this  when to take this  additional instructions   Synthroid 100 MCG tablet Generic drug: levothyroxine TAKE 1 TABLET BY MOUTH DAILY BEFORE BREAKFAST What changed: how much to take   Systane 0.4-0.3 % Soln Generic drug: Polyethyl Glycol-Propyl Glycol Place 1 drop into both eyes daily as needed (dry eyes).   Vitamin B 12 500 MCG Tabs Take 1,000 mcg by mouth daily.   vitamin C 1000 MG tablet Take 1,000 mg by mouth at bedtime. Ester C          Outstanding Labs/Studies   Repeat BMET at follow-up visit.  Duration of Discharge Encounter   Greater than 30 minutes including physician time.  Signed, Darreld Mclean, PA-C 05/20/2020, 10:34 AM

## 2020-05-20 NOTE — Progress Notes (Signed)
Pt given discharge instructions with understanding. Pt has no questions at this time. IV and monitor d/c. Medications delivered to room. Pt taken to discharge lounge while waiting for ride.

## 2020-05-20 NOTE — Discharge Instructions (Signed)
Medication Changes: - STOP Metoprolol tartrate (Lopressor) and START Metoprolol succinate (Toprol-XL) 25mg  daily. - STOP Trandolopril and START Losartan 25mg  daily instead. - START Amiodarone 200mg  twice daily for 5 days. Then on 05/25/2020, reduce to 200mg  once daily. - START Lasix 40mg  daily. START Potassium chloride 66mEq daily with this to help with your potassium level.  Heart Failure Education: 1. Weigh yourself EVERY morning after you go to the bathroom but before you eat or drink anything. Write this number down in a weight log/diary. If you gain 3 pounds overnight or 5 pounds in a week, call the office. 2. Take your medicines as prescribed. If you have concerns about your medications, please call us before you stop taking them.  3. Eat low salt foods--Limit salt (sodium) to 2000 mg per day. This will help prevent your body from holding onto fluid. Read food labels as many processed foods have a lot of sodium, especially canned goods and prepackaged meats. If you would like some assistance choosing low sodium foods, we would be happy to set you up with a nutritionist. 4. Limit all fluids for the day to less than 2 liters (64 ounces). Fluid includes all drinks, coffee, juice, ice chips, soup, jello, and all other liquids. 5. Stay as active as you can everyday. Staying active will give you more energy and make your muscles stronger. Start with 5 minutes at a time and work your way up to 30 minutes a day. Break up your activities--do some in the morning and some in the afternoon. Start with 3 days per week and work your way up to 5 days as you can.  If you have chest pain, feel short of breath, dizzy, or lightheaded, STOP. If you don't feel better after a short rest, call 911. If you do feel better, call the office to let us know you have symptoms with exercise.

## 2020-05-20 NOTE — Progress Notes (Signed)
Progress Note  Patient Name: [name] Date of Encounter: 05/20/2020  [facility] Cardiologist: Peter [name], MD   Subjective   Remains in [place], ready to go home, no complaints.  Inpatient Medications    Scheduled Meds: . amiodarone  200 mg Oral BID  . buPROPion  150 mg Oral Daily  . furosemide  80 mg Intravenous BID  . levothyroxine  100 mcg Oral QAC breakfast  . losartan  12.5 mg Oral Daily  . metoprolol succinate  25 mg Oral Daily  . potassium chloride  40 mEq Oral Daily  . Rivaroxaban  15 mg Oral Q supper  . sodium chloride flush  3 mL Intravenous Q12H   Continuous Infusions: . sodium chloride 250 mL (05/18/20 0949)  . sodium chloride Stopped (05/19/20 1113)   PRN Meds: sodium chloride, acetaminophen, ondansetron (ZOFRAN) IV, sodium chloride flush   Vital Signs    Vitals:   05/19/20 1906 05/19/20 2345 05/20/20 0509 05/20/20 0518  BP: (!) 104/58 126/66 129/70   Pulse:      Resp: 18 16 18    Temp: 97.6 F (36.4 C) 98.5 F (36.9 C) 98.1 F (36.7 C)   TempSrc: Oral Oral Oral   SpO2: 97% 98% 98%   Weight:    53.1 kg  Height:        Intake/Output Summary (Last 24 hours) at 05/20/2020 0927 Last data filed at 05/20/2020 0510 Gross per 24 hour  Intake 395.51 ml  Output 2610 ml  Net -2214.49 ml   Last 3 Weights 05/20/2020 05/19/2020 05/19/2020  Weight (lbs) 117 lb 1.6 oz 130 lb 8.2 oz 130 lb 8.2 oz  Weight (kg) 53.116 kg 59.2 kg 59.2 kg      Telemetry    SR, V - pacing - Personally Reviewed  ECG    No new ECG tracing today. - Personally Reviewed  Physical Exam   GEN: No acute distress.   Neck: JVD slightly above clavicle with patient sitting upright. Cardiac: Tachycardic with irregularly irregular rhythm. No murmurs, rubs, or gallops.  Respiratory: Decreased breath sounds in bases. No significant crackles appreciated. GI: Soft, non-distended, and non-tender. MS: No lower extremity edema. No deformity. Skin: Warm and dry. Neuro:  No focal  deficits. Psych: Normal affect. Responds appropriately.  Labs    High Sensitivity Troponin:  No results for input(s): TROPONINIHS in the last 720 hours.    Chemistry Recent Labs  Lab 05/18/20 0459 05/19/20 0241 05/20/20 0227  NA 139 141 139  K 3.5 3.6 3.5  CL 109 104 99  CO2 23 27 33*  GLUCOSE 97 111* 101*  BUN 29* 29* 25*  CREATININE 1.08* 1.03* 1.11*  CALCIUM 8.5* 8.5* 8.9  GFRNONAA 50* 53* 49*  ANIONGAP 7 10 7      Hematology Recent Labs  Lab 05/16/20 1819 05/19/20 0241  WBC 7.4 9.3  RBC 3.73* 3.83*  HGB 10.8* 11.3*  HCT 34.7* 34.5*  MCV 93.0 90.1  MCH 29.0 29.5  MCHC 31.1 32.8  RDW 13.2 13.0  PLT 216 213    BNP Recent Labs  Lab 05/16/20 1819  BNP 1,299.8*     DDimer No results for input(s): DDIMER in the last 168 hours.   Radiology    No results found.  Cardiac Studies   Echocardiogram 05/17/2020: Impressions: 1. Left ventricular ejection fraction, by estimation, is 25 to 30%. The  left ventricle has severely decreased function. The left ventricle  demonstrates global hypokinesis. Left ventricular diastolic function could  not  be evaluated.  2. Right ventricular systolic function is mildly reduced. The right  ventricular size is mildly enlarged. There is moderately elevated  pulmonary artery systolic pressure. The estimated right ventricular  systolic pressure is 88.8 mmHg.  3. Left atrial size was severely dilated.  4. Right atrial size was mildly dilated.  5. A small pericardial effusion is present. The pericardial effusion is  circumferential.  6. The mitral valve is grossly normal. Moderate to severe mitral valve  regurgitation. No evidence of mitral stenosis.  7. Tricuspid valve regurgitation is severe.  8. The aortic valve is tricuspid. Aortic valve regurgitation is not  visualized. No aortic stenosis is present.  9. The inferior vena cava is dilated in size with <50% respiratory  variability, suggesting right atrial pressure  of 15 mmHg.   Comparison(s): Changes from prior study are noted. EF now severely  reduced. Moderately dilated RV. Moderate to severe MR. Severe TR. RVSP ~49  mmHG.   Patient Profile     87 y.o. female with a history of non-ischemic cardiomyopathy with normalization of EF on last Echo in 06/2019, s/p BiV ICD, persistent atrial fibrillation on Xarelto, LBBB, hypertension, and hypothyroidism who was admitted on 05/16/2020 for acute CHF and atrial fibrillation with RVR.  Assessment & Plan    Acute on Chronic Combined CHF Non-Ischemic Cardiomyopathy - BNP elevated at 1,299. - Chest x-ray consistent with CHF with mild pulmonary edema and small bilateral pleural effusions. - Echo showed LVEF of 25-30% (down from 55-60% in 06/2019) with global hypokinesis and moderate to severe MR. RV mildly enlarged with mildly reduced systolic function, severe TR, and moderately elevated PASP. Also showed small pericardial effusion. - Currently on IV Lasix 80mg  twice daily. Documented urinary output of 2.2 L yesterday and net negative 7 L since admission. Appears euvolemic - Drop in EF felt to be secondary to atrial fibrillation with RVR. Given advance age, it was recommended to treat atrial fibrillation and heart failure medically and then reassess EF in 3 months. No plans for ischemic evaluation a this time. Can consider if EF does not recover with medical therapy. - s/p successful DCCV on 05/19/2020 - discharge home on  lasix 40 mg po daily, follow up in our clinic on 06/06/2020 with repeat BMP and BNP. Crea is stable at 1.1 today. Continue Xarelto 15 mg po daily for anticoagulation. Hb 11.3. Increase losartan to 25 mg po daily, started Toprol XL 25 mg po daily, BP improved post cardioversion.  Persistent Atrial Fibrillation - s/p cardioversion on 05/19/2020, now in SR with V pacing - TSH normal. - Initially started on Diltiazem but this was stopped due to reduced EF.  - Continue PO Amiodarone 200 mg BID till  3/20  and then continue amiodarone 200 mg po daily. - CHA2DS2-VASc = 5 (CHF, HTN, age x2, female). Continue Xarelto 15mg  daily.  S/p [employer] BiV ICD - Followed by Dr. Lovena Le.  Hypertension - controlled  Hypothyroidism - TSH normal. - Continue Synthroid.  For questions or updates, please contact [place] Please consult [email or web address] for contact info under    Signed, Ena Dawley, MD  05/20/2020, 9:27 AM

## 2020-05-21 ENCOUNTER — Ambulatory Visit: Payer: Medicare Other | Admitting: Cardiology

## 2020-05-21 ENCOUNTER — Telehealth (HOSPITAL_COMMUNITY): Payer: Self-pay

## 2020-05-21 NOTE — Telephone Encounter (Signed)
Called patient to scheduled hospital follow up appt with [facility] T[facility] as pt was discharged prior to completing screening. Pt meets qualifications. Pt stated she feels very weak, pt voice is not strong and very timid regarding post discharge plan of care. Has scheduled appt with PCP (Dr. Mariea Clonts) 3/17 @ 3:15pm- states she thinks she is going to cancel as she is tired, needs a haircut and doesn't want to be seen looking as she does, also stated she "didn't see the need to see Dr. Mariea Clonts as she is 'leaving'", per pt statement. Pt states she would like someone to look at her medications, as she is not familiar with them as her previous regimen. Encouraged pt to see PCP to verify new medication regimen, VS, and overall plan of care after hospitalization. If she decides to cancel PCP appt, offered [facility] appt 3/17 @ 10/11am. Concern for safe self-care with new medication changes and s/p hospital fatigue.  Gave information and education about HV TOC clinic and availability for 3/17 & 3/21, pt states she just cannot get to morning appts. Pt car is currently still located at [facility] from being admitted to hospital from dr office. Offered to set up [employer] to bring her to [place], then could drive her car home if she was feeling well enough.  Has appt with [place] office 4/1, pt asked if that appointment time can be moved, as she doesn't like morning appointments. Navigator will relay message to [employer] staff to call pt regarding scheduled appt.  Pt lives alone, but has good friends nearby that check up on her daily. Navigator encouraged pt to call her friend to see about coming to appt with her for close follow up s/p hospitalization. Made plan to call back at 3pm.    1500: Made pt aware [facility] office does not have an afternoon appt around 4/1 scheduled date, gave pt number to call [facility] for rescheduling appt. Pt states she doesn't like to make decisions. Able to discuss importance of  follow up care for medication management. Pt states she will not be going to PCP appt tomorrow. Encouraged pt to call today to cancel appt. Pt stated she would like to make an afternoon appt with HV T[facility], scheduled for Monday 3/21 @ 3pm. Will set up [employer] for ride to appt, pt plans to take her car back home from previous hospitalization--keys with valet/security, can assist pt Monday with key retrieval. Confirmed with patient to bring all medications to appt on Monday. Gave clinic number and Navigator number. Confirmed plan and time before ending call.   Pricilla Holm, RN, BSN Heart Failure Nurse Navigator [phone]

## 2020-05-22 ENCOUNTER — Telehealth: Payer: Self-pay | Admitting: Internal Medicine

## 2020-05-22 ENCOUNTER — Ambulatory Visit: Payer: Medicare Other | Admitting: Internal Medicine

## 2020-05-22 ENCOUNTER — Telehealth: Payer: Self-pay | Admitting: *Deleted

## 2020-05-22 NOTE — Telephone Encounter (Signed)
   [name] DOB: Dec 17, 1932 MRN: [id number]   RIDER WAIVER AND RELEASE OF LIABILITY  For purposes of improving physical access to our facilities, [facility] is pleased to partner with third parties to provide [facility] patients or other authorized individuals the option of convenient, on-demand ground transportation services (the [place]") through use of the technology service that enables users to request on-demand ground transportation from independent third-party providers.  By opting to use and accept these [employer], I, the undersigned, hereby agree on behalf of myself, and on behalf of any minor child using the [employer] for whom I am the parent or legal guardian, as follows:  1. Government social research officer provided to me are provided by independent third-party transportation providers who are not [employer] or employees and who are unaffiliated with [employer]. 2. [place] is neither a transportation carrier nor a common or public carrier. 3. [facility] has no control over the quality or safety of the transportation that occurs as a result of the [employer]. 4. [facility] cannot guarantee that any third-party transportation provider will complete any arranged transportation service. 5. [facility] makes no representation, warranty, or guarantee regarding the reliability, timeliness, quality, safety, suitability, or availability of any of the Transport Services or that they will be error free. 6. I fully understand that traveling by vehicle involves risks and dangers of serious bodily injury, including permanent disability, paralysis, and death. I agree, on behalf of myself and on behalf of any minor child using the Transport Services for whom I am the parent or legal guardian, that the entire risk arising out of my use of the [employer] remains solely with me, to the maximum extent permitted under applicable law. 7. The [employer] are provided "as is" and "as available." [facility] disclaims all representations and warranties, express, implied or statutory, not expressly set out in these terms, including the implied warranties of merchantability and fitness for a particular purpose. 8. I hereby waive and release [facility], its agents, employees, officers, directors, representatives, insurers, attorneys, assigns, successors, subsidiaries, and affiliates from any and all past, present, or future claims, demands, liabilities, actions, causes of action, or suits of any kind directly or indirectly arising from acceptance and use of the [employer]. 9. I further waive and release [facility] and its affiliates from all present and future liability and responsibility for any injury or death to persons or damages to property caused by or related to the use of the [employer]. 10. I have read this Waiver and Release of Liability, and I understand the terms used in it and their legal significance. This Waiver is freely and voluntarily given with the understanding that my right (as well as the right of any minor child for whom I am the parent or legal guardian using the [employer]) to legal recourse against [place] in connection with the [employer] is knowingly surrendered in return for use of these services.   I attest that I read the consent document to [name], gave Ms. Full the opportunity to ask questions and answered the questions asked (if any). I affirm that [name] then provided consent for she's participation in this program.     Legrand Pitts

## 2020-05-22 NOTE — Telephone Encounter (Signed)
I have made the 1st attempt to contact the patient or family member in charge, in order to follow up from recently being discharged from the hospital. No answer, busy,  but I will make another attempt at a different time.

## 2020-05-23 ENCOUNTER — Telehealth (HOSPITAL_COMMUNITY): Payer: Self-pay

## 2020-05-23 NOTE — Telephone Encounter (Signed)
Late entry: 3/17 Spoke with patient's friend, Netta Cedars (with permission from Ms. Aristizabal). Friend concerned if HV TOC appt is necessary and how much the uber driver will cost. Explained pt expressed concern for weakness with last phone call and new medication regimen adjustments/education. Pt stated she was going to cancel appt with PCP on 3/17, and next scheduled appt wasn't until 4/1. Pt would benefit from close follow up s/p hospitalization. Transportation service is cost FREE to patient. Jolayne Haines asked these questions 3 more times with concern for her friend needing to make the trip back to the hospital campus. Navigator advocated to encourage patient to make her own decision with the information given. Bobbie plans to call Ms. Susman back to make a final decision.  3/18 1330: Navigator returned missed phone call to [place] regarding questions about location of HV TOC clinic. Gave directions and location 3 times. Pt asked again how much the transportation would cost, explained it would be free to her. Pt now aware support person can ride with her to appt as long as they are present at time of pick up. Pt states she plans to come on Monday and asked for assistance getting her keys from security/valet. Confirmed appt time and to bring all medications to appointment.   Pricilla Holm, RN, BSN Heart Failure Nurse Navigator [phone]

## 2020-05-23 NOTE — Telephone Encounter (Signed)
I have made the 2nd attempt to contact the patient or family member in charge, in order to follow up from recently being discharged from the hospital.Number rings busy, I will make another attempt at a different time.

## 2020-05-26 ENCOUNTER — Telehealth (HOSPITAL_COMMUNITY): Payer: Self-pay

## 2020-05-26 ENCOUNTER — Encounter (HOSPITAL_COMMUNITY): Payer: Self-pay

## 2020-05-26 ENCOUNTER — Other Ambulatory Visit: Payer: Self-pay

## 2020-05-26 ENCOUNTER — Ambulatory Visit (HOSPITAL_COMMUNITY)
Admission: RE | Admit: 2020-05-26 | Discharge: 2020-05-26 | Disposition: A | Discharge status: Home / Self Care | Payer: Medicare Other | Source: Ambulatory Visit | Attending: Internal Medicine | Admitting: Internal Medicine

## 2020-05-26 VITALS — BP 110/62 | HR 55 | Wt 126.4 lb

## 2020-05-26 DIAGNOSIS — I447 Left bundle-branch block, unspecified: Secondary | ICD-10-CM | POA: Diagnosis not present

## 2020-05-26 DIAGNOSIS — Z7901 Long term (current) use of anticoagulants: Secondary | ICD-10-CM | POA: Insufficient documentation

## 2020-05-26 DIAGNOSIS — F419 Anxiety disorder, unspecified: Secondary | ICD-10-CM | POA: Diagnosis not present

## 2020-05-26 DIAGNOSIS — Z950 Presence of cardiac pacemaker: Secondary | ICD-10-CM | POA: Insufficient documentation

## 2020-05-26 DIAGNOSIS — Z79899 Other long term (current) drug therapy: Secondary | ICD-10-CM | POA: Insufficient documentation

## 2020-05-26 DIAGNOSIS — I420 Dilated cardiomyopathy: Secondary | ICD-10-CM

## 2020-05-26 DIAGNOSIS — I4819 Other persistent atrial fibrillation: Secondary | ICD-10-CM | POA: Diagnosis not present

## 2020-05-26 DIAGNOSIS — E039 Hypothyroidism, unspecified: Secondary | ICD-10-CM | POA: Diagnosis not present

## 2020-05-26 DIAGNOSIS — Z793 Long term (current) use of hormonal contraceptives: Secondary | ICD-10-CM | POA: Diagnosis not present

## 2020-05-26 DIAGNOSIS — F32A Depression, unspecified: Secondary | ICD-10-CM | POA: Insufficient documentation

## 2020-05-26 DIAGNOSIS — Z79890 Hormone replacement therapy (postmenopausal): Secondary | ICD-10-CM | POA: Insufficient documentation

## 2020-05-26 DIAGNOSIS — Z86010 Personal history of colonic polyps: Secondary | ICD-10-CM | POA: Insufficient documentation

## 2020-05-26 DIAGNOSIS — I110 Hypertensive heart disease with heart failure: Secondary | ICD-10-CM | POA: Insufficient documentation

## 2020-05-26 DIAGNOSIS — Z8249 Family history of ischemic heart disease and other diseases of the circulatory system: Secondary | ICD-10-CM | POA: Insufficient documentation

## 2020-05-26 DIAGNOSIS — I5022 Chronic systolic (congestive) heart failure: Secondary | ICD-10-CM | POA: Diagnosis not present

## 2020-05-26 DIAGNOSIS — Z87891 Personal history of nicotine dependence: Secondary | ICD-10-CM | POA: Insufficient documentation

## 2020-05-26 NOTE — Patient Instructions (Signed)
Continue current medications  Thank you for allowing [place] to provider your heart failure care after your recent hospitalization. Please follow-up with [facility] on 06/06/20 as scheduled  If you have any questions, issues, or concerns before your next appointment please call our office at [phone], opt. 2 and leave a message for the triage nurse.

## 2020-05-26 NOTE — Progress Notes (Signed)
[facility] Trans[facility]  PCP: Hollace Kinnier Primary Cardiologist: [name], Peter  HPI:  [name] is a 87 y.o.  female  with a PMH significant for presumed NICM, LBBB, chronic systolic CHF s/p BiV ICD with improvement in EF, HTN, hypothyroidism, anxiety, depression.    Long history of presumed NICM dating back before available notes in chart.  Had ICD placed 05/2005 and placed on GDMT EF improved to 40-45%. Later she was switched to CRT-P in 2014.  EF improved to normal 55-60% in 04/2013.    Doing okay then developed PAfib in 2017 relatively low burden initially just monitored.  Started on eliquis then switched to xarelto said both caused itching, then switched to warfarin and later back to xarelto.    04/28/20 came to the [facility] clinic with complaints of increasing dyspnea, volume overload was found to be in afib rate 125.  Switched her metoprolol to scheduled bid, increased diuretics and scheduled her for DCCV which occurred on 05/06/2020 and was successful but when seen for office follow up was found to be back in afib w/rvr, was volume overloaded and admitted.    Found to have once again severely reduced EF 25-30% in afib at 90bpm.  Underwent repeat DCCV but with the assistance of amiodarone this time, she was diuresed and medications changed to reflect HFrEF status.  Ultimately decrease in EF felt to be due primarily to afib.    She is still very deconditioned, felt exhausted after getting dressed to come here.  Denies any chest pain.  Hasn't done much since leaving the hospital.  She asks that we don't make medication changes today.    ROS: All systems negative except as listed in HPI, PMH and Problem List.  SH:  Social History   Socioeconomic History  . Marital status: Single    Spouse name: Not on file  . Number of children: 0  . Years of education: Not on file  . Highest education level: Not on file  Occupational History  . Occupation: Retired     Fish farm manager: RETIRED  Tobacco Use  . Smoking status: Former Smoker    Packs/day: 1.00    Years: 15.00    Pack years: 15.00    Types: Cigarettes    Quit date: 12/22/1993    Years since quitting: 26.4  . Smokeless tobacco: Never Used  . Tobacco comment: Does'nt reall year quit   Vaping Use  . Vaping Use: Never used  Substance and Sexual Activity  . Alcohol use: No    Alcohol/week: 0.0 standard drinks  . Drug use: No  . Sexual activity: Never  Other Topics Concern  . Not on file  Social History Narrative   Pt lives alone in a condo   Social Determinants of Health   Financial Resource Strain: Not on file  Food Insecurity: Not on file  Transportation Needs: Not on file  Physical Activity: Not on file  Stress: Not on file  Social Connections: Not on file  Intimate Partner Violence: Not on file    FH:  Family History  Problem Relation Age of Onset  . Stroke Father   . Heart disease Other        maternal side  . Colon cancer Paternal Uncle   . Breast cancer Maternal Aunt   . Colon cancer Cousin   . Diabetes Neg Hx   . Colon polyps Neg Hx   . Rectal cancer Neg Hx   . Stomach cancer  Neg Hx     Past Medical History:  Diagnosis Date  . AAA (abdominal aortic aneurysm) (HCC)    2.8 cm 05/2018; 5 year [place] per ACR guidelines  . Allergy    all year   . Anxiety disorder   . Arthritis   . Barrett's esophagus 04/2008  . Benign neoplasm of colon   . Blood transfusion without reported diagnosis    long ago per pt  . Cataract    removed both eyes  . CHF (congestive heart failure) ([place])   . Chronic airway obstruction, not elsewhere classified   . Complication of anesthesia    slow to wake up  . Depressive disorder, not elsewhere classified   . Diverticulitis   . Dysthymic disorder   . Erosive gastritis 08/2004  . GERD (gastroesophageal reflux disease)   . Hair thinning   . HTN (hypertension)    pt denies   . Hyperplastic polyps of stomach 06/2002  . Hypothyroidism   . ICD  (implantable cardiac defibrillator) in place    BiV/ICD; s/p removal of ICD with insertion of BiV PPM 04/26/12  . Incisional hernia   . Insomnia 08/20/2015  . Kyphosis 08/20/2015  . LBBB (left bundle branch block)   . Melanoma ([place])    right arm  . Mitral regurgitation   . Nonischemic cardiomyopathy ([place])    EF initially 20%; Last measurement up to 50% per echo in August of 2012  . Osteopenia   . Other and unspecified hyperlipidemia   . Other and unspecified hyperlipidemia   . Pacemaker 04/26/2012  . PAF (paroxysmal atrial fibrillation) ([place])    11/2016  . Pain in joint, lower leg   . Palpitations   . Pneumonia   . Restless legs syndrome (RLS)   . Synovial cyst of popliteal space   . Unspecified chronic bronchitis ([place])   . Unspecified nasal polyp   . Unspecified sinusitis (chronic)     Current Outpatient Medications  Medication Sig Dispense Refill  . acetaminophen (TYLENOL) 500 MG tablet Take 1,000 mg by mouth every 8 (eight) hours as needed for moderate pain or mild pain.    Marland Kitchen amiodarone (PACERONE) 200 MG tablet Take 1 tablet (200mg ) twice daily for 5 days. Then on 05/25/2020, reduce to 1 tablet (200mg ) once daily. 35 tablet 2  . Ascorbic Acid (VITAMIN C) 1000 MG tablet Take 1,000 mg by mouth at bedtime. Ester C    . Biotin 5000 MCG TABS Take 5,000 mcg by mouth daily.    . Cyanocobalamin (VITAMIN B 12) 500 MCG TABS Take 1,000 mcg by mouth daily.     Marland Kitchen estradiol (ESTRACE) 0.1 MG/GM vaginal cream Place 1 g vaginally 2 (two) times a week.    . fexofenadine (ALLEGRA) 180 MG tablet Take 180 mg by mouth daily.    . furosemide (LASIX) 40 MG tablet Take 1 tablet (40 mg total) by mouth daily. 30 tablet 2  . losartan (COZAAR) 25 MG tablet Take 1 tablet (25 mg total) by mouth daily. 30 tablet 2  . metoprolol succinate (TOPROL-XL) 25 MG 24 hr tablet Take 1 tablet (25 mg total) by mouth daily. 30 tablet 2  . Polyethyl Glycol-Propyl Glycol (SYSTANE) 0.4-0.3 % SOLN Place 1 drop into both eyes  daily as needed (dry eyes).    . potassium chloride SA (KLOR-CON) 20 MEQ tablet Take 1 tablet (20 mEq total) by mouth daily. 30 tablet 2  . RABEprazole (ACIPHEX) 20 MG tablet Take one tablet by mouth once  daily for stomach (Patient taking differently: Take 20 mg by mouth daily.) 90 tablet 1  . Rivaroxaban (XARELTO) 15 MG TABS tablet TAKE 1 TABLET DAILY WITH   SUPPER (DOSE REDUCED) (Patient taking differently: Take 15 mg by mouth daily after supper.) 90 tablet 3  . SYNTHROID 100 MCG tablet TAKE 1 TABLET BY MOUTH DAILY BEFORE BREAKFAST (Patient taking differently: Take 100 mcg by mouth daily before breakfast.) 90 tablet 1  . buPROPion (WELLBUTRIN XL) 150 MG 24 hr tablet TAKE 1 TABLET BY MOUTH EVERY DAY (Patient not taking: Reported on 05/26/2020) 90 tablet 1  . Calcium Carbonate-Vitamin D (CALCIUM + D PO) Take 2 tablets by mouth daily. 1000 units Vitamin D 1200 mg calcium (Patient not taking: Reported on 05/26/2020)    . FERREX 150 150 MG capsule TAKE 1 CAPSULE BY MOUTH EVERY DAY (Patient not taking: Reported on 05/26/2020) 90 capsule 1   No current facility-administered medications for this encounter.    Vitals:   05/26/20 1458  BP: 110/62  Pulse: (!) 55  SpO2: 99%  Weight: 57.3 kg    PHYSICAL EXAM: Cardiac: JVD flat, normal rate and rhythm, clear s1 and s2, no murmurs, rubs or gallops, no LE edema Pulmonary: CTAB, not in distress Abdominal: non distended abdomen, soft and nontender Psych: Alert, conversant, in good spirits  ECG  AV dual paced rhythm rate of 55  ASSESSMENT & PLAN: Chronic systolic CHF: -presumed NICM -Had ICD placed 05/2005 and placed on GDMT EF improved to 40-45%. Has LBBB, Later she was switched to CRT-P in 2014.  EF improved to normal 55-60% in 04/2013 -05/2020 severely reduced EF 25-30% in afib at 90bpm s/p repeat dccv with amio load -EF reduction thought to be due to afib w/RVR -NYHA Class III, symptoms volume status okay -continue toprol XL 25mg , losartan 25mg ,  lasix 40mg  -discussed possible addition of SGLT2i with patient but she declined -offered her PT vs cardiac rehab for her deconditioning but she declined saying "I'll just get weak again when it's over", tried to explain that if she remained active afterward that wouldn't happen but she was extremely negative throughout the encounter  Persistent Afib: -diagnosed by CRT-P device in 2017 -CHADS2VASC score 5, continue xarelto -on Toprol xL and amiodarone, continue -s/p DCCV x2 this month second one with amiodarone assistance -remains in NSR today  Hypertension  -BP well controlled today, continue toprol XL 25 and losartan 25    Hypothyroidism  -TSH normal 05/17/20, continue current synthroid dose    Follow up with general cardiology

## 2020-05-26 NOTE — Telephone Encounter (Signed)
Called to confirm Heart & Vascular Transitions of Care appointment today at [facility]. Patient reminded to bring all medications and pill box organizer with them. Confirmed patient has transportation via [employer]. Pt confirms she plans to drive home from appt as her car/keys were left with valet upon previous hospitalization. Pt states being very nervous about appt, Navigator explained plan of care for visit. Encouraged to bring support person, if it will make her feel more comfortable. Pt asked about appt with [facility] on 4/1- encouraged pt to attend appt with [facility] [place].   Confirmed appointment prior to ending call.

## 2020-05-27 ENCOUNTER — Encounter (HOSPITAL_COMMUNITY): Payer: Self-pay

## 2020-05-27 NOTE — Telephone Encounter (Signed)
Transition Care Management Follow-Up Telephone Call   Date discharged and where:05/20/2020 with [place]  How have you been since you were released from the hospital? Better  Any patient concerns? No, patient does not want to follow up with [place] at this time  Items Reviewed:   Meds: no  Allergies:no  Dietary Changes Reviewed:no  Functional Questionnaire:  Independent-I Dependent-D  ADLs:I   Dressing- I    Eating-I   Maintaining continence-I   Transferring-I   Transportation-I   Meal Prep-I   Managing Meds- I  Confirmed importance and Date/Time of follow-up visits scheduled:Patient stated that she does not want to schedule at this time. Stated that she will call if she changes her mind. Stated that she is being taken care of by Specialist.    Confirmed with patient if condition worsens to call PCP or go to the Emergency Dept. Patient was given office number and encouraged to call back with questions or concerns: Yes

## 2020-05-31 NOTE — Progress Notes (Deleted)
Cardiology Office Note:    Date:  05/31/2020   ID:  [name], DOB [birth year], MRN [id number]  PCP:  Wardell Honour, MD  Cardiologist:  Peter [name], MD  Electrophysiologist:  None   Referring MD: Gayland Curry, DO   Chief Complaint: hospital follow-up for acute CHF and atrial fibrillation  History of Present Illness:    [name] is a 87 y.o. female with a history of presumed non-ischemic cardiomyopathy with chronic systolic CHF with EF of 40-98% on recent Echo in 05/17/2020, s/p BiV ICD with last generator change in 01/2019, persistent atrial fibrillation s/p DCCV on 05/19/2020 on Amiodarone and Xarelto, LBBB, AAA followed by Vascular Surgery, hypertension, hypothyroidism, GERD with Barrett's esophagus, melanoma, and anxiety/depression who is followed by Dr. [name] and present today for hospital follow-up of acute CHF and atrial fibrillation.  Patient was seen by Kerin Ransom, PA-C, on 04/28/2020 with complaints of shortness of breath, edema, and fatigue. She was found to be in atrial fibrillation with rates in the 120's at that time. She underwent successful DCCV on 05/06/2020. She was then seen by Dr. [name] in the office on 05/16/2020 for follow-up at which time she was not doing well. She was back in atrial fibrillation with RVR. She had marked edema with weeping of her lower extremities. She reported significant fatigue and some shortness of breath. She was not aware of her rhythm but reported no improvement after her cardioversion. She had only been taking half of her Metoprolol and she hadn't missed any doses of her Xarelto. She was admitted from the office for IV diuresis and rate control. Echo showed LVEF of 25-30% (down from 55-60% in 06/2019) with global hypokinesis and moderate to severe MR. RV mildly enlarged with mildly reduced systolic function, severe TR, and moderately elevated PASP. Also showed small pericardial effusion. Drop in EF was felt to be secondary to atrial  fibrillation with RVR. No ischemic evaluation was felt to be necessary. She was diuresed with IV Lasix and started started on IV Amiodarone. She underwent successful DCCV on 3/14 and remained in sinus rhythm throughout the rest of the hospitalization. She was discharged on 05/20/2020 on Amiodarone 200mg  twice daily for 5 days with plans to then transition to 200mg  daily, Losartan 25mg  daily, Toprol-XL 25mg  daily, Lasix 40mg  daily, Potassium 20 mEq daily, and Xarelto 15mg  daily.  Patient presents today for hospital follow-up. ***  Chronic Combined CHF Presumed Non-Ischemic Cardiomyopathy - Patient recently admitted with acute on chronic CHF - Echo during admission showed LVEF of 25-30 % (down from 55-60% in 06/2019) with global hypokinesis and moderate to severe MR. RV mildly enlarged with mildly reduced systolic function, severe TR, and moderately elevated PASP. Also showed small pericardial effusion. Drop in EF and CHF exacerbation was felt to be secondary to atrial fibrillation with RVR. - Appears euvolemic on exam. *** - Continue Lasix 40mg  daily and Potassium 20 mEq daily. - Continue Losartan 25mg  daily and Toprol-XL 25mg  daily.  - Spironolactone if BP allows and patient is willing. *** - Discussed importance of daily weights and sodium/fluid restrictions. - Will check BMET today. - Will repeat Echo in 3 months to reassess LV function. If EF has not improved, may need to consider ischemic evaluation.  Persistent Atrial Fibrillation  - S/p DCCV on 05/19/2020 during recent admission. - Maintaining sinus rhythm today. *** - Continue Toprol-XL 25mg  daily. - Continue Amiodarone 200mg  daily.  - Continue Xarelto 15mg  daily.   S/p [employer] BiV  ICD  - Followed by Dr. Lovena Le.   Hypertension - *** - Continue medications for CHF as above.  AAA - Last abdominal ultrasound in 05/2019 showed largest aortic measurement of 2.9cm. Increased from 2.4cm in 2018 but stable from CT in 05/2020. -  Followed by Dr. Scot Dock. Plan is for repeat ultrasound in 11/2020.   Past Medical History:  Diagnosis Date  . AAA (abdominal aortic aneurysm) (HCC)    2.8 cm 05/2018; 5 year [place] per ACR guidelines  . Allergy    all year   . Anxiety disorder   . Arthritis   . Barrett's esophagus 04/2008  . Benign neoplasm of colon   . Blood transfusion without reported diagnosis    long ago per pt  . Cataract    removed both eyes  . CHF (congestive heart failure) ([place])   . Chronic airway obstruction, not elsewhere classified   . Complication of anesthesia    slow to wake up  . Depressive disorder, not elsewhere classified   . Diverticulitis   . Dysthymic disorder   . Erosive gastritis 08/2004  . GERD (gastroesophageal reflux disease)   . Hair thinning   . HTN (hypertension)    pt denies   . Hyperplastic polyps of stomach 06/2002  . Hypothyroidism   . ICD (implantable cardiac defibrillator) in place    BiV/ICD; s/p removal of ICD with insertion of BiV PPM 04/26/12  . Incisional hernia   . Insomnia 08/20/2015  . Kyphosis 08/20/2015  . LBBB (left bundle branch block)   . Melanoma ([place])    right arm  . Mitral regurgitation   . Nonischemic cardiomyopathy ([place])    EF initially 20%; Last measurement up to 50% per echo in August of 2012  . Osteopenia   . Other and unspecified hyperlipidemia   . Other and unspecified hyperlipidemia   . Pacemaker 04/26/2012  . PAF (paroxysmal atrial fibrillation) ([place])    11/2016  . Pain in joint, lower leg   . Palpitations   . Pneumonia   . Restless legs syndrome (RLS)   . Synovial cyst of popliteal space   . Unspecified chronic bronchitis ([place])   . Unspecified nasal polyp   . Unspecified sinusitis (chronic)     Past Surgical History:  Procedure Laterality Date  . ABDOMINAL HYSTERECTOMY  1076   endometriosis  . APPENDECTOMY    . BI-VENTRICULAR PACEMAKER INSERTION N/A 04/26/2012   Procedure: BI-VENTRICULAR PACEMAKER INSERTION (CRT-P);  Surgeon: Evans Lance, MD;  [facility] CATH LAB;  Service: Cardiovascular;  Laterality: N/A;  . BIV PACEMAKER GENERATOR CHANGEOUT N/A 01/26/2019   Procedure: BIV PACEMAKER GENERATOR CHANGEOUT;  Surgeon: Evans Lance, MD;  Location: [place] CV LAB;  Service: Cardiovascular;  Laterality: N/A;  . CARDIOVERSION N/A 05/06/2020   Procedure: CARDIOVERSION;  Surgeon: Pixie Casino, MD;  Location: [place];  Service: Cardiovascular;  Laterality: N/A;  . CARDIOVERSION N/A 05/19/2020   Procedure: CARDIOVERSION;  Surgeon: Jerline Pain, MD;  Location: [facility] ENDOSCOPY;  Service: Cardiovascular;  Laterality: N/A;  . COLONOSCOPY    . defibrillator insertion     s/p removal of previously implanted BiV ICD and insertion of a new BiV pacemaker on 04/26/12  . excise mole of lip  2001   Dr. Larena Sox  . excision of wen  1984   Dr. Parks Ranger  . FEMORAL HERNIA REPAIR  12/25/2012   Dr Dalbert Batman  . FEMORAL HERNIA REPAIR  01/04/2013   Recurrent - Dr. Lilyan Punt  .  INCISIONAL HERNIA REPAIR N/A 10/01/2013   Procedure: LAPAROSCOPIC INCISIONAL HERNIA;  Surgeon: Gayland Curry, MD;  Location: [facility] ORS;  Service: General;  Laterality: N/A;  . INCISIONAL HERNIA REPAIR N/A 09/07/2018   Procedure: LAPAROSCOPIC ASSISTED REPAIR OF INCISIONAL HERNIA;  Surgeon: Greer Pickerel, MD;  Location: [place];  Service: General;  Laterality: N/A;  . INGUINAL HERNIA REPAIR Right 09/26/2012   Procedure: RIGHT INGUINAL HERNIA REPAIR WITH MESH;  Surgeon: Odis Hollingshead, MD;  Location: [place];  Service: General;  Laterality: Right;  . INGUINAL HERNIA REPAIR Right 12/25/2012   Procedure: explor right groin, small bowel rescection, tissue repair right femoral hernia;  Surgeon: Adin Hector, MD;  Location: [facility];  Service: General;  Laterality: Right;  . INSERTION OF MESH Right 09/26/2012   Procedure: INSERTION OF MESH;  Surgeon: Odis Hollingshead, MD;  Location: [place];  Service: General;  Laterality: Right;  . INSERTION OF MESH N/A 10/01/2013   Procedure:  INSERTION OF MESH;  Surgeon: Gayland Curry, MD;  Location: [facility];  Service: General;  Laterality: N/A;  . LAPAROSCOPY N/A 01/04/2013   Procedure: Diagnostic Laparoscopy, exploratory laparotomy with small bowel resection, closure of right femoral hernia repair;  Surgeon: Madilyn Hook, DO;  Location: [facility];  Service: General;  Laterality: N/A;  . Left arm surgery    . POLYPECTOMY    . RIGHT BREAST LUMPECTOMY  1988   Dr. Marylene Buerger  . right knee surgery     arthroscopy: Dr. Shellia Carwin  . TONSILLECTOMY    . UPPER GASTROINTESTINAL ENDOSCOPY      Current Medications: No outpatient medications have been marked as taking for the 06/06/20 encounter (Appointment) with Darreld Mclean, PA-C.     Allergies:   Hydrocodone, Cephalexin, Ciprofloxacin, Doxycycline, Escitalopram, Levofloxacin, Moxifloxacin, Ofloxacin, Sertraline, Sulfamethoxazole, Tape, Latex, and Neomycin   Social History   Socioeconomic History  . Marital status: Single    Spouse name: Not on file  . Number of children: 0  . Years of education: Not on file  . Highest education level: Not on file  Occupational History  . Occupation: Retired    Fish farm manager: RETIRED  Tobacco Use  . Smoking status: Former Smoker    Packs/day: 1.00    Years: 15.00    Pack years: 15.00    Types: Cigarettes    Quit date: 12/22/1993    Years since quitting: 26.4  . Smokeless tobacco: Never Used  . Tobacco comment: Does'nt reall year quit   Vaping Use  . Vaping Use: Never used  Substance and Sexual Activity  . Alcohol use: No    Alcohol/week: 0.0 standard drinks  . Drug use: No  . Sexual activity: Never  Other Topics Concern  . Not on file  Social History Narrative   Pt lives alone in a condo   Social Determinants of Health   Financial Resource Strain: Not on file  Food Insecurity: Not on file  Transportation Needs: Not on file  Physical Activity: Not on file  Stress: Not on file  Social Connections: Not on file     Family  History: The patient's ***family history includes Breast cancer in her maternal aunt; Colon cancer in her cousin and paternal uncle; Heart disease in an other family member; Stroke in her father. There is no history of Diabetes, Colon polyps, Rectal cancer, or Stomach cancer.  ROS:   Please see the history of present illness.    *** All other systems reviewed and are negative.  EKGs/Labs/Other Studies Reviewed:    The following studies were reviewed today:  Echocardiogram 05/17/2020: Impressions: 1. Left ventricular ejection fraction, by estimation, is 25 to 30%. The  left ventricle has severely decreased function. The left ventricle  demonstrates global hypokinesis. Left ventricular diastolic function could  not be evaluated.  2. Right ventricular systolic function is mildly reduced. The right  ventricular size is mildly enlarged. There is moderately elevated  pulmonary artery systolic pressure. The estimated right ventricular  systolic pressure is 74.9 mmHg.  3. Left atrial size was severely dilated.  4. Right atrial size was mildly dilated.  5. A small pericardial effusion is present. The pericardial effusion is  circumferential.  6. The mitral valve is grossly normal. Moderate to severe mitral valve  regurgitation. No evidence of mitral stenosis.  7. Tricuspid valve regurgitation is severe.  8. The aortic valve is tricuspid. Aortic valve regurgitation is not  visualized. No aortic stenosis is present.  9. The inferior vena cava is dilated in size with <50% respiratory  variability, suggesting right atrial pressure of 15 mmHg.   Comparison(s): Changes from prior study are noted. EF now severely  reduced. Moderately dilated RV. Moderate to severe MR. Severe TR. RVSP ~49  mmHG.    EKG:  EKG ordered today. EKG personally reviewed and demonstrates ***.  Recent Labs: 11/22/2019: ALT 11 05/16/2020: B Natriuretic Peptide 1,299.8 05/17/2020: TSH 2.784 05/19/2020: Hemoglobin  11.3; Platelets 213 05/20/2020: BUN 25; Creatinine, Ser 1.11; Magnesium 2.0; Potassium 3.5; Sodium 139  Recent Lipid Panel    Component Value Date/Time   CHOL 159 11/22/2019 1630   CHOL 170 06/19/2019 1630   TRIG 137 11/22/2019 1630   HDL 53 11/22/2019 1630   HDL 65 06/19/2019 1630   CHOLHDL 3.0 11/22/2019 1630   VLDL 15 07/07/2016 1054   LDLCALC 82 11/22/2019 1630    Physical Exam:    Vital Signs: There were no vitals taken for this visit.    Wt Readings from Last 3 Encounters:  05/26/20 126 lb 6.4 oz (57.3 kg)  05/20/20 117 lb 1.6 oz (53.1 kg)  05/16/20 148 lb (67.1 kg)     General: 87 y.o. female in no acute distress. HEENT: Normocephalic and atraumatic. Sclera clear. EOMs intact. Neck: Supple. No carotid bruits. No JVD. Heart: *** RRR. Distinct S1 and S2. No murmurs, gallops, or rubs. Radial and distal pedal pulses 2+ and equal bilaterally. Lungs: No increased work of breathing. Clear to ausculation bilaterally. No wheezes, rhonchi, or rales.  Abdomen: Soft, non-distended, and non-tender to palpation. Bowel sounds present in all 4 quadrants.  MSK: Normal strength and tone for age. *** Extremities: No lower extremity edema.    Skin: Warm and dry. Neuro: Alert and oriented x3. No focal deficits. Psych: Normal affect. Responds appropriately.   Assessment:    No diagnosis found.  Plan:     Disposition: Follow up in ***   Medication Adjustments/Labs and Tests Ordered: Current medicines are reviewed at length with the patient today.  Concerns regarding medicines are outlined above.  No orders of the defined types were placed in this encounter.  No orders of the defined types were placed in this encounter.   There are no Patient Instructions on file for this visit.   Signed, Darreld Mclean, PA-C  05/31/2020 10:39 AM    [facility]

## 2020-06-02 ENCOUNTER — Telehealth: Payer: Self-pay | Admitting: Cardiology

## 2020-06-02 NOTE — Telephone Encounter (Signed)
Spoke to patient she stated she has appointment with Sande Rives PA 06/06/20.Stated she has appointment with Dr.Jordan 06/18/20.She wanted to know if she needs to keep 4/1/ appointment.

## 2020-06-02 NOTE — Telephone Encounter (Signed)
Spoke with patient Dr.Jordan advised ok to cancel appointment 4/1.Advised to keep appointment as scheduled with Dr.Jordan 4/13 at 4:00 pm.

## 2020-06-02 NOTE — Telephone Encounter (Signed)
New Message:     Please call, concerning her appt on Friday(06-06-20) with Sande Rives.

## 2020-06-03 ENCOUNTER — Telehealth: Payer: Self-pay | Admitting: Family Medicine

## 2020-06-03 NOTE — Telephone Encounter (Signed)
I called patient to schedule an awv and she said she does not do them and does not want to do them

## 2020-06-05 ENCOUNTER — Ambulatory Visit: Payer: Medicare Other | Admitting: Internal Medicine

## 2020-06-06 ENCOUNTER — Ambulatory Visit: Payer: Medicare Other | Admitting: Student

## 2020-06-13 NOTE — Progress Notes (Signed)
[name] Date of Birth: 1933-02-26 Medical Record #[id number]  History of Present Illness: [name] is seen today for follow up. She has multiple medical issues which include a cardiomyopathy, underlying BiV/ICD which resulted in improvement of her EF, LBBB, HTN, thyroid disease, melanoma, anxiety/depression and chronic systolic HF.   In September 2018 she was noted in device clinic to have paroxysmal Afib with controlled rate. She was started on Eliquis. [place] Vasc score of at least 4. She developed itching on Eliquis and switched to Xarelto. Itching did not go away so she was switched to Coumadin. After about a month the itching resolved.  She was later switched back to Xarelto without rash. She had change out of her ICD generator in November due to ERI. Her device was downgraded to Biventricular pacing only. Follow up with Dr Lovena Le  In February showed normal device function.  She was seen in the office on 04/28/20 with complaints of increased SOB, edema and fatigue. Was found to be in Afib with rate 125. Patient admitted from the office with acute on chronic CHF felt to be secondary to atrial fibrillation with RVR. BNP elevated at 1,299. Chest x-ray consistent with CHF with mild pulmonary edema and small bilateral pleural effusions. Echo showed LVEF of 25-30% (down from 55-60% in 06/2019) with global hypokinesis and moderate to severe MR. RV mildly enlarged with mildly reduced systolic function, severe TR, and moderately elevated PASP. Also showed small pericardial effusion. Patient was diuresed with IV Lasix. Net negative 8.078 L during admission. Discharge weight 117lb but this may not be accurate (was 130.5lbs on 05/19/2020). Was discharged on Lasix 40mg  daily, Losartan 25mg  daily (rather than home Trandolapril), and Toprol-XL 25mg  daily (rather than home Lopressor). Did not add spironolactone  given soft BP at times. Patient also does not want to be on a lot of new medications. She was  considered for Tikosyn but did not want to start this. She was started on amiodarone. Drop in EF felt to be secondary to atrial fibrillation with RVR. Given advance age, it was recommended to treat atrial fibrillation and heart failure medically and then reassess EF in 3 months. No ischemic evaluation felt to be necessary this admission.    On follow up today she is doing OK. Still notes some swelling in right ankle. Weight is back to baseline. No dyspnea or chest pain. Is reluctant to take the amiodarone.   Current Outpatient Medications  Medication Sig Dispense Refill  . acetaminophen (TYLENOL) 500 MG tablet Take 1,000 mg by mouth every 8 (eight) hours as needed for moderate pain or mild pain.    Marland Kitchen amiodarone (PACERONE) 200 MG tablet Take 1 tablet (200 mg total) by mouth daily. 90 tablet 3  . Ascorbic Acid (VITAMIN C) 1000 MG tablet Take 1,000 mg by mouth at bedtime. Ester C    . Biotin 5000 MCG TABS Take 5,000 mcg by mouth daily.    Marland Kitchen buPROPion (WELLBUTRIN XL) 150 MG 24 hr tablet TAKE 1 TABLET BY MOUTH EVERY DAY 90 tablet 1  . Calcium Carbonate-Vitamin D (CALCIUM + D PO) Take 2 tablets by mouth daily. 1000 units Vitamin D 1200 mg calcium    . Cyanocobalamin (VITAMIN B 12) 500 MCG TABS Take 1,000 mcg by mouth daily.     Marland Kitchen estradiol (ESTRACE) 0.1 MG/GM vaginal cream Place 1 g vaginally 2 (two) times a week.    Marland Kitchen FERREX 150 150 MG capsule TAKE 1 CAPSULE BY MOUTH EVERY DAY 90  capsule 1  . fexofenadine (ALLEGRA) 180 MG tablet Take 180 mg by mouth daily.    . furosemide (LASIX) 40 MG tablet TAKE 1 TABLET (40 MG TOTAL) BY MOUTH DAILY. 30 tablet 2  . losartan (COZAAR) 25 MG tablet Take 1 tablet (25 mg total) by mouth daily. 30 tablet 2  . losartan (COZAAR) 25 MG tablet TAKE 1 TABLET (25 MG TOTAL) BY MOUTH DAILY. 30 tablet 2  . metoprolol succinate (TOPROL-XL) 25 MG 24 hr tablet Take 1 tablet (25 mg total) by mouth daily. 30 tablet 2  . metoprolol succinate (TOPROL-XL) 25 MG 24 hr tablet TAKE 1  TABLET (25 MG TOTAL) BY MOUTH DAILY. 30 tablet 2  . Polyethyl Glycol-Propyl Glycol (SYSTANE) 0.4-0.3 % SOLN Place 1 drop into both eyes daily as needed (dry eyes).    . potassium chloride SA (KLOR-CON) 20 MEQ tablet Take 1 tablet (20 mEq total) by mouth daily. 30 tablet 2  . potassium chloride SA (KLOR-CON) 20 MEQ tablet TAKE 1 TABLET (20 MEQ TOTAL) BY MOUTH DAILY. 30 tablet 2  . RABEprazole (ACIPHEX) 20 MG tablet Take one tablet by mouth once daily for stomach (Patient taking differently: Take 20 mg by mouth daily.) 90 tablet 1  . Rivaroxaban (XARELTO) 15 MG TABS tablet TAKE 1 TABLET DAILY WITH   SUPPER (DOSE REDUCED) (Patient taking differently: Take 15 mg by mouth daily after supper.) 90 tablet 3  . SYNTHROID 100 MCG tablet TAKE 1 TABLET BY MOUTH DAILY BEFORE BREAKFAST (Patient taking differently: Take 100 mcg by mouth daily before breakfast.) 90 tablet 1   No current facility-administered medications for this visit.    Allergies  Allergen Reactions  . Hydrocodone Itching  . Cephalexin Itching and Swelling  . Ciprofloxacin Other (See Comments)    Not indicated due to aneurysm in aorta    . Doxycycline Other (See Comments)    Unknown reaction  . Escitalopram Other (See Comments)    Dry mouth  . Levofloxacin Other (See Comments)    Not indicated due to aneurysm in aorta   . Moxifloxacin Other (See Comments)    Not indicated due to aneurysm in aorta   . Ofloxacin Other (See Comments)    Unknown reaction  . Sertraline Other (See Comments)    insomnia  . Sulfamethoxazole Hives    Any sulfa meds.  . Tape Other (See Comments)    Burns skin.   . Latex Rash    "If pt. Makes contact with or wearing"  . Neomycin Rash    Past Medical History:  Diagnosis Date  . AAA (abdominal aortic aneurysm) (HCC)    2.8 cm 05/2018; 5 year [place] per ACR guidelines  . Allergy    all year   . Anxiety disorder   . Arthritis   . Barrett's esophagus 04/2008  . Benign neoplasm of colon   . Blood  transfusion without reported diagnosis    long ago per pt  . Cataract    removed both eyes  . CHF (congestive heart failure) ([place])   . Chronic airway obstruction, not elsewhere classified   . Complication of anesthesia    slow to wake up  . Depressive disorder, not elsewhere classified   . Diverticulitis   . Dysthymic disorder   . Erosive gastritis 08/2004  . GERD (gastroesophageal reflux disease)   . Hair thinning   . HTN (hypertension)    pt denies   . Hyperplastic polyps of stomach 06/2002  . Hypothyroidism   .  ICD (implantable cardiac defibrillator) in place    BiV/ICD; s/p removal of ICD with insertion of BiV PPM 04/26/12  . Incisional hernia   . Insomnia 08/20/2015  . Kyphosis 08/20/2015  . LBBB (left bundle branch block)   . Melanoma ([place])    right arm  . Mitral regurgitation   . Nonischemic cardiomyopathy ([place])    EF initially 20%; Last measurement up to 50% per echo in August of 2012  . Osteopenia   . Other and unspecified hyperlipidemia   . Other and unspecified hyperlipidemia   . Pacemaker 04/26/2012  . PAF (paroxysmal atrial fibrillation) ([place])    11/2016  . Pain in joint, lower leg   . Palpitations   . Pneumonia   . Restless legs syndrome (RLS)   . Synovial cyst of popliteal space   . Unspecified chronic bronchitis ([place])   . Unspecified nasal polyp   . Unspecified sinusitis (chronic)     Past Surgical History:  Procedure Laterality Date  . ABDOMINAL HYSTERECTOMY  1076   endometriosis  . APPENDECTOMY    . BI-VENTRICULAR PACEMAKER INSERTION N/A 04/26/2012   Procedure: BI-VENTRICULAR PACEMAKER INSERTION (CRT-P);  Surgeon: Evans Lance, MD;  Location: [facility] C[facility] LAB;  Service: Cardiovascular;  Laterality: N/A;  . BIV PACEMAKER GENERATOR CHANGEOUT N/A 01/26/2019   Procedure: BIV PACEMAKER GENERATOR CHANGEOUT;  Surgeon: Evans Lance, MD;  Location: [facility] CV LAB;  Service: Cardiovascular;  Laterality: N/A;  . CARDIOVERSION N/A 05/06/2020   Procedure:  CARDIOVERSION;  Surgeon: Pixie Casino, MD;  Location: [facility];  Service: Cardiovascular;  Laterality: N/A;  . CARDIOVERSION N/A 05/19/2020   Procedure: CARDIOVERSION;  Surgeon: Jerline Pain, MD;  Location: [facility] ENDOSCOPY;  Service: Cardiovascular;  Laterality: N/A;  . COLONOSCOPY    . defibrillator insertion     s/p removal of previously implanted BiV ICD and insertion of a new BiV pacemaker on 04/26/12  . excise mole of lip  2001   Dr. Larena Sox  . excision of wen  1984   Dr. Parks Ranger  . FEMORAL HERNIA REPAIR  12/25/2012   Dr Dalbert Batman  . FEMORAL HERNIA REPAIR  01/04/2013   Recurrent - Dr. Lilyan Punt  . INCISIONAL HERNIA REPAIR N/A 10/01/2013   Procedure: LAPAROSCOPIC INCISIONAL HERNIA;  Surgeon: Gayland Curry, MD;  Location: [facility] ORS;  Service: General;  Laterality: N/A;  . INCISIONAL HERNIA REPAIR N/A 09/07/2018   Procedure: LAPAROSCOPIC ASSISTED REPAIR OF INCISIONAL HERNIA;  Surgeon: Greer Pickerel, MD;  Location: [place];  Service: General;  Laterality: N/A;  . INGUINAL HERNIA REPAIR Right 09/26/2012   Procedure: RIGHT INGUINAL HERNIA REPAIR WITH MESH;  Surgeon: Odis Hollingshead, MD;  Location: [place];  Service: General;  Laterality: Right;  . INGUINAL HERNIA REPAIR Right 12/25/2012   Procedure: explor right groin, small bowel rescection, tissue repair right femoral hernia;  Surgeon: Adin Hector, MD;  Location: [facility];  Service: General;  Laterality: Right;  . INSERTION OF MESH Right 09/26/2012   Procedure: INSERTION OF MESH;  Surgeon: Odis Hollingshead, MD;  Location: [place] [facility];  Service: General;  Laterality: Right;  . INSERTION OF MESH N/A 10/01/2013   Procedure: INSERTION OF MESH;  Surgeon: Gayland Curry, MD;  Location: [facility];  Service: General;  Laterality: N/A;  . LAPAROSCOPY N/A 01/04/2013   Procedure: Diagnostic Laparoscopy, exploratory laparotomy with small bowel resection, closure of right femoral hernia repair;  Surgeon: Madilyn Hook, DO;  Location: [facility];  Service: General;   Laterality: N/A;  .  Left arm surgery    . POLYPECTOMY    . RIGHT BREAST LUMPECTOMY  1988   Dr. Marylene Buerger  . right knee surgery     arthroscopy: Dr. Shellia Carwin  . TONSILLECTOMY    . UPPER GASTROINTESTINAL ENDOSCOPY      Social History   Tobacco Use  Smoking Status Former Smoker  . Packs/day: 1.00  . Years: 15.00  . Pack years: 15.00  . Types: Cigarettes  . Quit date: 12/22/1993  . Years since quitting: 26.5  Smokeless Tobacco Never Used  Tobacco Comment   Does'nt reall year quit     Social History   Substance and Sexual Activity  Alcohol Use No  . Alcohol/week: 0.0 standard drinks    Family History  Problem Relation Age of Onset  . Stroke Father   . Heart disease Other        maternal side  . Colon cancer Paternal Uncle   . Breast cancer Maternal Aunt   . Colon cancer Cousin   . Diabetes Neg Hx   . Colon polyps Neg Hx   . Rectal cancer Neg Hx   . Stomach cancer Neg Hx     Review of Systems: The review of systems is per the HPI.   All other systems were reviewed and are negative.  Physical Exam: BP 114/69   Pulse (!) 55   Ht 5\' 8"  (1.727 m)   Wt 124 lb 3.2 oz (56.3 kg)   SpO2 97%   BMI 18.88 kg/m  GENERAL:  thin, elderly thin WF appears ill.  HEENT:  PERRL, EOMI, sclera are clear. Oropharynx is clear. NECK:  No JVD, carotid upstroke brisk and symmetric, no bruits, no thyromegaly or adenopathy LUNGS:  Clear to auscultation bilaterally CHEST:  Unremarkable HEART:  RRR,  PMI not displaced or sustained,S1 and S2 within normal limits, no S3, no S4: no clicks, no rubs, no murmurs ABD:  Soft, nontender. BS +, no masses or bruits. No hepatomegaly, no splenomegaly EXT:  2 + pulses throughout, 1+ edema right leg only.  no cyanosis no clubbing SKIN:  Warm and dry.  No rashes NEURO:  Alert and oriented x 3. Cranial nerves II through XII intact. PSYCH:  Cognitively intact   Wt Readings from Last 3 Encounters:  06/18/20 124 lb 3.2 oz (56.3 kg)  05/26/20 126  lb 6.4 oz (57.3 kg)  05/20/20 117 lb 1.6 oz (53.1 kg)     LABORATORY DATA: Lab Results  Component Value Date   WBC 9.3 05/19/2020   HGB 11.3 (L) 05/19/2020   HCT 34.5 (L) 05/19/2020   PLT 213 05/19/2020   GLUCOSE 101 (H) 05/20/2020   CHOL 159 11/22/2019   TRIG 137 11/22/2019   HDL 53 11/22/2019   LDLCALC 82 11/22/2019   ALT 11 11/22/2019   AST 19 11/22/2019   NA 139 05/20/2020   K 3.5 05/20/2020   CL 99 05/20/2020   CREATININE 1.11 (H) 05/20/2020   BUN 25 (H) 05/20/2020   CO2 33 (H) 05/20/2020   TSH 2.784 05/17/2020   INR 1.7 (H) 05/18/2020   Ecg today: AV paced rate 55.   I have personally reviewed and interpreted this study.   Echo: 07/22/16: Study Conclusions  - Left ventricle: The cavity size was mildly dilated. Wall   thickness was normal. Systolic function was normal. The estimated   ejection fraction was in the range of 50% to 55%. Wall motion was   normal; there were no regional wall motion  abnormalities. - Aortic valve: There was trivial regurgitation. - Mitral valve: There was mild regurgitation. - Atrial septum: No defect or patent foramen ovale was identified.  Echo 06/29/19: IMPRESSIONS    1. Left ventricular ejection fraction, by estimation, is 55 to 60%. The  left ventricle has normal function. The left ventricle has no regional  wall motion abnormalities. Left ventricular diastolic parameters are  consistent with Grade II diastolic  dysfunction (pseudonormalization). Elevated left atrial pressure.  2. Right ventricular systolic function is normal. The right ventricular  size is normal. There is moderately elevated pulmonary artery systolic  pressure.  3. Left atrial size was moderately dilated.  4. The mitral valve is normal in structure. Mild to moderate mitral valve  regurgitation.  5. Tricuspid valve regurgitation is mild to moderate.  6. The aortic valve is tricuspid. Aortic valve regurgitation is mild. No  aortic stenosis is present.   7. Aortic dilatation noted. There is borderline dilatation of the  ascending aorta measuring 37 mm.  8. The inferior vena cava is dilated in size with >50% respiratory  variability, suggesting right atrial pressure of 8 mmHg.    Echocardiogram 05/17/2020: Impressions: 1. Left ventricular ejection fraction, by estimation, is 25 to 30%. The  left ventricle has severely decreased function. The left ventricle  demonstrates global hypokinesis. Left ventricular diastolic function could  not be evaluated.  2. Right ventricular systolic function is mildly reduced. The right  ventricular size is mildly enlarged. There is moderately elevated  pulmonary artery systolic pressure. The estimated right ventricular  systolic pressure is 59.1 mmHg.  3. Left atrial size was severely dilated.  4. Right atrial size was mildly dilated.  5. A small pericardial effusion is present. The pericardial effusion is  circumferential.  6. The mitral valve is grossly normal. Moderate to severe mitral valve  regurgitation. No evidence of mitral stenosis.  7. Tricuspid valve regurgitation is severe.  8. The aortic valve is tricuspid. Aortic valve regurgitation is not  visualized. No aortic stenosis is present.  9. The inferior vena cava is dilated in size with <50% respiratory  variability, suggesting right atrial pressure of 15 mmHg.   Comparison(s): Changes from prior study are noted. EF now severely  reduced. Moderately dilated RV. Moderate to severe MR. Severe TR. RVSP ~49  mmHG.   Assessment / Plan: 1.  History of dialted Cardiomyopathy with chronic systolic CHF.  EF last normal in April 2021. Has BiV pacemaker in place. Recent  significant worsening of CHF. EF down to 25-30%.  Suspect this is mostly related to her Afib with RVR. Much improved today. Weight is down. Swelling largely resolved. Maintaining NSR. Continue losartan, lasix, Toprol. Will plan to repeat Echo in 3 months.  2. NSVT. Follow  up ICD/biV in device clinic. S/p generator change out in November 2020. Normal device function.  3. Paroxysmal Afib. Now persistent and ERAD post cardioversion on March 1. RVR. Repeat DCCV post amiodarone. Now in NSR. [place] Vasc score of at least 4. On Xarelto. Needs to stay on amiodarone 200 mg daily. Will check CMET and TSH in 2 months.  4. Small AAA and left iliac aneurysm. Follow up with [facility]. Last doppler showed this was 2.9 cm.

## 2020-06-18 ENCOUNTER — Ambulatory Visit (INDEPENDENT_AMBULATORY_CARE_PROVIDER_SITE_OTHER): Payer: Medicare Other | Admitting: Cardiology

## 2020-06-18 ENCOUNTER — Encounter: Payer: Self-pay | Admitting: Cardiology

## 2020-06-18 ENCOUNTER — Other Ambulatory Visit: Payer: Self-pay

## 2020-06-18 VITALS — BP 114/69 | HR 55 | Ht 68.0 in | Wt 124.2 lb

## 2020-06-18 DIAGNOSIS — Z7901 Long term (current) use of anticoagulants: Secondary | ICD-10-CM

## 2020-06-18 DIAGNOSIS — I2510 Atherosclerotic heart disease of native coronary artery without angina pectoris: Secondary | ICD-10-CM

## 2020-06-18 DIAGNOSIS — I5043 Acute on chronic combined systolic (congestive) and diastolic (congestive) heart failure: Secondary | ICD-10-CM | POA: Diagnosis not present

## 2020-06-18 DIAGNOSIS — I447 Left bundle-branch block, unspecified: Secondary | ICD-10-CM

## 2020-06-18 DIAGNOSIS — I2584 Coronary atherosclerosis due to calcified coronary lesion: Secondary | ICD-10-CM

## 2020-06-18 DIAGNOSIS — I4819 Other persistent atrial fibrillation: Secondary | ICD-10-CM

## 2020-06-18 MED ORDER — AMIODARONE HCL 200 MG PO TABS: 200.0000 mg | ORAL_TABLET | Freq: Every day | ORAL | 3 refills | Status: DC

## 2020-06-19 ENCOUNTER — Telehealth: Payer: Self-pay | Admitting: Cardiology

## 2020-06-19 ENCOUNTER — Other Ambulatory Visit: Payer: Self-pay

## 2020-06-19 MED ORDER — FUROSEMIDE 40 MG PO TABS: ORAL_TABLET | Freq: Every day | ORAL | 2 refills | Status: DC

## 2020-06-19 NOTE — Telephone Encounter (Signed)
*  STAT* If patient is at the pharmacy, call can be transferred to refill team.   1. Which medications need to be refilled? (please list nam e of each medication and dose if known) furosemide (LASIX) 40 MG tablet potassium chloride SA (KLOR-CON) 20 MEQ tablet losartan (COZAAR) 25 MG tablet   2. Which pharmacy/location (including street and city if local pharmacy) is medication to be sent to? [id number] - [place], [place] - [address]  3. Do they need a 30 day or 90 day supply? [place]

## 2020-06-20 ENCOUNTER — Other Ambulatory Visit (HOSPITAL_COMMUNITY): Payer: Self-pay

## 2020-06-24 ENCOUNTER — Other Ambulatory Visit: Payer: Self-pay

## 2020-06-24 ENCOUNTER — Encounter: Payer: Self-pay | Admitting: Family Medicine

## 2020-06-24 ENCOUNTER — Ambulatory Visit (INDEPENDENT_AMBULATORY_CARE_PROVIDER_SITE_OTHER): Payer: Medicare Other | Admitting: Family Medicine

## 2020-06-24 VITALS — BP 140/62 | HR 58 | Temp 96.8°F | Ht 68.0 in | Wt 124.6 lb

## 2020-06-24 DIAGNOSIS — I10 Essential (primary) hypertension: Secondary | ICD-10-CM

## 2020-06-24 DIAGNOSIS — E039 Hypothyroidism, unspecified: Secondary | ICD-10-CM | POA: Diagnosis not present

## 2020-06-24 DIAGNOSIS — I4819 Other persistent atrial fibrillation: Secondary | ICD-10-CM | POA: Diagnosis not present

## 2020-06-24 DIAGNOSIS — I5023 Acute on chronic systolic (congestive) heart failure: Secondary | ICD-10-CM

## 2020-06-24 DIAGNOSIS — I2510 Atherosclerotic heart disease of native coronary artery without angina pectoris: Secondary | ICD-10-CM

## 2020-06-24 DIAGNOSIS — I2584 Coronary atherosclerosis due to calcified coronary lesion: Secondary | ICD-10-CM

## 2020-06-24 MED ORDER — LEVOTHYROXINE SODIUM 100 MCG PO TABS: 100.0000 ug | ORAL_TABLET | Freq: Every day | ORAL | 1 refills | Status: DC

## 2020-06-24 NOTE — Progress Notes (Signed)
Provider:  Alain Honey, MD  Careteam: Patient Care Team: Wardell Honour, MD as PCP - General (Family Medicine) [name], Peter M, MD as PCP - Cardiology (Cardiology) [name], Peter M, MD as Consulting Physician (Cardiology) Milus Banister, MD as Attending Physician (Gastroenterology)  PLACE OF SERVICE:  [place] Directive information Does Patient Have a Medical Advance Directive?: Yes, Type of Advance Directive: [place];Living will, Does patient want to make changes to medical advance directive?: No - Patient declined  Allergies  Allergen Reactions  . Hydrocodone Itching  . Cephalexin Itching and Swelling  . Ciprofloxacin Other (See Comments)    Not indicated due to aneurysm in aorta    . Doxycycline Other (See Comments)    Unknown reaction  . Escitalopram Other (See Comments)    Dry mouth  . Levofloxacin Other (See Comments)    Not indicated due to aneurysm in aorta   . Moxifloxacin Other (See Comments)    Not indicated due to aneurysm in aorta   . Ofloxacin Other (See Comments)    Unknown reaction  . Sertraline Other (See Comments)    insomnia  . Sulfamethoxazole Hives    Any sulfa meds.  . Tape Other (See Comments)    Burns skin.   . Latex Rash    "If pt. Makes contact with or wearing"  . Neomycin Rash    Chief Complaint  Patient presents with  . Medical Management of Chronic Issues    6 month follow-up, discuss need for PNA or exclude     HPI: Patient is a 87 y.o. female she was hospitalized last month with acute on chronic heart failure.  Cardiologist thought her heart failure was related to her atrial for with rapid ventricular response.  She was cardioverted and discharged on amiodarone.  Since her discharge she has felt much better and is has seen Dr. [name] her cardiologist since hospitalization.  This was a routine follow-up scheduled prior to that hospitalization.  She has some questions about lab work done in the  hospital as well as her medications and attempted to respond to those this best I could. She is worried about the edema in her lower legs.  She knows she has venous insufficiency as well as heart failure.  Currently takes furosemide as well as potassium supplement  Review of Systems:  Review of Systems  Constitutional: Positive for malaise/fatigue.  HENT: Negative.   Respiratory: Negative.  Negative for shortness of breath.   Cardiovascular: Negative.   Genitourinary: Negative.   Musculoskeletal: Negative.   Skin: Negative.   Neurological: Negative.   Psychiatric/Behavioral: Negative.   All other systems reviewed and are negative.   Past Medical History:  Diagnosis Date  . AAA (abdominal aortic aneurysm) (HCC)    2.8 cm 05/2018; 5 year [place] per ACR guidelines  . Allergy    all year   . Anxiety disorder   . Arthritis   . Barrett's esophagus 04/2008  . Benign neoplasm of colon   . Blood transfusion without reported diagnosis    long ago per pt  . Cataract    removed both eyes  . CHF (congestive heart failure) ([place])   . Chronic airway obstruction, not elsewhere classified   . Complication of anesthesia    slow to wake up  . Depressive disorder, not elsewhere classified   . Diverticulitis   . Dysthymic disorder   . Erosive gastritis 08/2004  . GERD (gastroesophageal reflux disease)   .  Hair thinning   . HTN (hypertension)    pt denies   . Hyperplastic polyps of stomach 06/2002  . Hypothyroidism   . ICD (implantable cardiac defibrillator) in place    BiV/ICD; s/p removal of ICD with insertion of BiV PPM 04/26/12  . Incisional hernia   . Insomnia 08/20/2015  . Kyphosis 08/20/2015  . LBBB (left bundle branch block)   . Melanoma ([place])    right arm  . Mitral regurgitation   . Nonischemic cardiomyopathy ([place])    EF initially 20%; Last measurement up to 50% per echo in August of 2012  . Osteopenia   . Other and unspecified hyperlipidemia   . Other and unspecified hyperlipidemia    . Pacemaker 04/26/2012  . PAF (paroxysmal atrial fibrillation) ([place])    11/2016  . Pain in joint, lower leg   . Palpitations   . Pneumonia   . Restless legs syndrome (RLS)   . Synovial cyst of popliteal space   . Unspecified chronic bronchitis ([place])   . Unspecified nasal polyp   . Unspecified sinusitis (chronic)    Past Surgical History:  Procedure Laterality Date  . ABDOMINAL HYSTERECTOMY  1076   endometriosis  . APPENDECTOMY    . BI-VENTRICULAR PACEMAKER INSERTION N/A 04/26/2012   Procedure: BI-VENTRICULAR PACEMAKER INSERTION (CRT-P);  Surgeon: Evans Lance, MD;  Location: [facility] CATH LAB;  Service: Cardiovascular;  Laterality: N/A;  . BIV PACEMAKER GENERATOR CHANGEOUT N/A 01/26/2019   Procedure: BIV PACEMAKER GENERATOR CHANGEOUT;  Surgeon: Evans Lance, MD;  Location: [place] CV LAB;  Service: Cardiovascular;  Laterality: N/A;  . CARDIOVERSION N/A 05/06/2020   Procedure: CARDIOVERSION;  Surgeon: Pixie Casino, MD;  Location: [place];  Service: Cardiovascular;  Laterality: N/A;  . CARDIOVERSION N/A 05/19/2020   Procedure: CARDIOVERSION;  Surgeon: Jerline Pain, MD;  Location[facility] ENDOSCOPY;  Service: Cardiovascular;  Laterality: N/A;  . COLONOSCOPY    . defibrillator insertion     s/p removal of previously implanted BiV ICD and insertion of a new BiV pacemaker on 04/26/12  . excise mole of lip  2001   Dr. Larena Sox  . excision of wen  1984   Dr. Parks Ranger  . FEMORAL HERNIA REPAIR  12/25/2012   Dr Dalbert Batman  . FEMORAL HERNIA REPAIR  01/04/2013   Recurrent - Dr. Lilyan Punt  . INCISIONAL HERNIA REPAIR N/A 10/01/2013   Procedure: LAPAROSCOPIC INCISIONAL HERNIA;  Surgeon: Gayland Curry, MD;  Location: [facility] ORS;  Service: General;  Laterality: N/A;  . INCISIONAL HERNIA REPAIR N/A 09/07/2018   Procedure: LAPAROSCOPIC ASSISTED REPAIR OF INCISIONAL HERNIA;  Surgeon: Greer Pickerel, MD;  Location: [place];  Service: General;  Laterality: N/A;  . INGUINAL HERNIA REPAIR Right 09/26/2012    Procedure: RIGHT INGUINAL HERNIA REPAIR WITH MESH;  Surgeon: Odis Hollingshead, MD;  Location: [place];  Service: General;  Laterality: Right;  . INGUINAL HERNIA REPAIR Right 12/25/2012   Procedure: explor right groin, small bowel rescection, tissue repair right femoral hernia;  Surgeon: Adin Hector, MD;  Location: [facility];  Service: General;  Laterality: Right;  . INSERTION OF MESH Right 09/26/2012   Procedure: INSERTION OF MESH;  Surgeon: Odis Hollingshead, MD;  Location: [place];  Service: General;  Laterality: Right;  . INSERTION OF MESH N/A 10/01/2013   Procedure: INSERTION OF MESH;  Surgeon: Gayland Curry, MD;  Location: [facility];  Service: General;  Laterality: N/A;  . LAPAROSCOPY N/A 01/04/2013   Procedure: Diagnostic Laparoscopy, exploratory laparotomy  with small bowel resection, closure of right femoral hernia repair;  Surgeon: Madilyn Hook, DO;  Location: [facility];  Service: General;  Laterality: N/A;  . Left arm surgery    . POLYPECTOMY    . RIGHT BREAST LUMPECTOMY  1988   Dr. Marylene Buerger  . right knee surgery     arthroscopy: Dr. Shellia Carwin  . TONSILLECTOMY    . UPPER GASTROINTESTINAL ENDOSCOPY     Social History:   reports that she quit smoking about 26 years ago. Her smoking use included cigarettes. She has a 15.00 pack-year smoking history. She has never used smokeless tobacco. She reports that she does not drink alcohol and does not use drugs.  Family History  Problem Relation Age of Onset  . Stroke Father   . Heart disease Other        maternal side  . Colon cancer Paternal Uncle   . Breast cancer Maternal Aunt   . Colon cancer Cousin   . Diabetes Neg Hx   . Colon polyps Neg Hx   . Rectal cancer Neg Hx   . Stomach cancer Neg Hx     Medications: Patient's Medications  New Prescriptions   No medications on file  Previous Medications   ACETAMINOPHEN (TYLENOL) 500 MG TABLET    Take 1,000 mg by mouth every 8 (eight) hours as needed for moderate pain or mild pain.    AMIODARONE (PACERONE) 200 MG TABLET    Take 1 tablet (200 mg total) by mouth daily.   ASCORBIC ACID (VITAMIN C) 1000 MG TABLET    Take 1,000 mg by mouth at bedtime. Ester C   BIOTIN 5000 MCG TABS    Take 5,000 mcg by mouth daily.   BUPROPION (WELLBUTRIN XL) 150 MG 24 HR TABLET    TAKE 1 TABLET BY MOUTH EVERY DAY   CALCIUM CARBONATE-VITAMIN D (CALCIUM + D PO)    Take 2 tablets by mouth daily. 1000 units Vitamin D 1200 mg calcium   CYANOCOBALAMIN (VITAMIN B 12) 500 MCG TABS    Take 1,000 mcg by mouth daily.    ESTRADIOL (ESTRACE) 0.1 MG/GM VAGINAL CREAM    Place 1 g vaginally 2 (two) times a week.   FERREX 150 150 MG CAPSULE    TAKE 1 CAPSULE BY MOUTH EVERY DAY   FEXOFENADINE (ALLEGRA) 180 MG TABLET    Take 180 mg by mouth daily.   FUROSEMIDE (LASIX) 40 MG TABLET    TAKE 1 TABLET (40 MG TOTAL) BY MOUTH DAILY.   LOSARTAN (COZAAR) 25 MG TABLET    Take 1 tablet (25 mg total) by mouth daily.   METOPROLOL SUCCINATE (TOPROL-XL) 25 MG 24 HR TABLET    Take 1 tablet (25 mg total) by mouth daily.   POLYETHYL GLYCOL-PROPYL GLYCOL (SYSTANE) 0.4-0.3 % SOLN    Place 1 drop into both eyes daily as needed (dry eyes).   POTASSIUM CHLORIDE SA (KLOR-CON) 20 MEQ TABLET    Take 1 tablet (20 mEq total) by mouth daily.   RABEPRAZOLE (ACIPHEX) 20 MG TABLET    Take one tablet by mouth once daily for stomach   RIVAROXABAN (XARELTO) 15 MG TABS TABLET    TAKE 1 TABLET DAILY WITH   SUPPER (DOSE REDUCED)   SYNTHROID 100 MCG TABLET    TAKE 1 TABLET BY MOUTH DAILY BEFORE BREAKFAST  Modified Medications   No medications on file  Discontinued Medications   LOSARTAN (COZAAR) 25 MG TABLET    TAKE 1 TABLET (25 MG  TOTAL) BY MOUTH DAILY.   METOPROLOL SUCCINATE (TOPROL-XL) 25 MG 24 HR TABLET    TAKE 1 TABLET (25 MG TOTAL) BY MOUTH DAILY.   POTASSIUM CHLORIDE SA (KLOR-CON) 20 MEQ TABLET    TAKE 1 TABLET (20 MEQ TOTAL) BY MOUTH DAILY.    Physical Exam:  Vitals:   06/24/20 1437  BP: 140/62  Pulse: (!) 58  Temp: (!) 96.8 F (36  C)  TempSrc: Temporal  SpO2: 99%  Weight: 124 lb 9.6 oz (56.5 kg)  Height: 5\' 8"  (1.727 m)   Body mass index is 18.95 kg/m. Wt Readings from Last 3 Encounters:  06/24/20 124 lb 9.6 oz (56.5 kg)  06/18/20 124 lb 3.2 oz (56.3 kg)  05/26/20 126 lb 6.4 oz (57.3 kg)    Physical Exam Vitals and nursing note reviewed.  Constitutional:      Appearance: Normal appearance.  HENT:     Head: Normocephalic.  Cardiovascular:     Rate and Rhythm: Normal rate and regular rhythm.  Pulmonary:     Effort: Pulmonary effort is normal.     Breath sounds: Normal breath sounds.  Musculoskeletal:     Right lower leg: Edema present.     Left lower leg: Edema present.     Comments: Edema is probably 1-2+.  Neurological:     General: No focal deficit present.     Mental Status: She is alert and oriented to person, place, and time.  Psychiatric:        Mood and Affect: Mood normal.        Behavior: Behavior normal.        Thought Content: Thought content normal.        Judgment: Judgment normal.     Labs reviewed: Basic Metabolic Panel: Recent Labs    11/22/19 1630 04/28/20 1635 05/16/20 1819 05/17/20 0259 05/18/20 0459 05/19/20 0241 05/20/20 0227  NA 136 138   < > 137 139 141 139  K 4.9 4.4   < > 3.5 3.5 3.6 3.5  CL 104 106   < > 109 109 104 99  CO2 25 17*   < > 18* 23 27 33*  GLUCOSE 81 88   < > 100* 97 111* 101*  BUN 44* 30*   < > 32* 29* 29* 25*  CREATININE 0.93* 0.94   < > 0.97 1.08* 1.03* 1.11*  CALCIUM 9.1 8.9   < > 8.9 8.5* 8.5* 8.9  MG  --  2.1  --  1.9 1.8 2.1 2.0  TSH 0.47 2.600  --  2.784  --   --   --    < > = values in this interval not displayed.   Liver Function Tests: Recent Labs    11/22/19 1630  AST 19  ALT 11  BILITOT 0.4  PROT 6.3   No results for input(s): LIPASE, AMYLASE in the last 8760 hours. No results for input(s): AMMONIA in the last 8760 hours. CBC: Recent Labs    11/22/19 1630 04/28/20 1635 05/16/20 1819 05/19/20 0241  WBC 5.9 7.0 7.4  9.3  NEUTROABS 3,634  --   --  6.6  HGB 11.4* 11.2 10.8* 11.3*  HCT 34.2* 33.4* 34.7* 34.5*  MCV 92.2 91 93.0 90.1  PLT 167 216 216 213   Lipid Panel: Recent Labs    11/22/19 1630  CHOL 159  HDL 53  LDLCALC 82  TRIG 137  CHOLHDL 3.0   TSH: Recent Labs    11/22/19 1630 04/28/20 1635  05/17/20 0259  TSH 0.47 2.600 2.784   A1C: No results found for: HGBA1C   Assessment/Plan  1. Acute on chronic systolic CHF (congestive heart failure) ([place]) She feels a lot better having gained back her energy.  She is not dyspneic at present.  She was cardioverted and is currently taking amiodarone along with metoprolol as rate controller.  2. Primary hypertension Blood pressure is good today at 140/62 continue same medication  3. Persistent atrial fibrillation ([place]) She appears to be in normal sinus rhythm today.  She does express concerns about taking amiodarone  4. Hypothyroidism, unspecified type TSH done last month in the hospital was therapeutic we will continue same dose of thyroid supplement.   Alain Honey, MD [facility] [phone]

## 2020-06-24 NOTE — Patient Instructions (Signed)
Pick up ferrous fumarate at drug rate

## 2020-07-10 ENCOUNTER — Telehealth: Payer: Self-pay | Admitting: Cardiology

## 2020-07-10 NOTE — Telephone Encounter (Signed)
.  Pt c/o swelling: STAT is pt has developed SOB within 24 hours  1) How much weight have you gained and in what time span? No weight gain  2) If swelling, where is the swelling located? right leg  3) Are you currently taking a fluid pill?yes  4) Are you currently SOB? No   5) Do you have a log of your daily weights (if so, list)? no  6) Have you gained 3 pounds in a day or 5 pounds in a week?  7) Have you traveled recently? no

## 2020-07-10 NOTE — Telephone Encounter (Signed)
Spoke to patient she stated she has increased swelling in both lower legs.Right leg worse.Stated she has gained 4 lbs within the last 1 month.No sob.Advised to double Lasix dose for the next 3 days only then return to normal dose.Stated she would like to see Dr.Jordan.Appointment scheduled with Dr.Jordan 07/17/20 at 4:40 pm.

## 2020-07-12 ENCOUNTER — Other Ambulatory Visit: Payer: Self-pay | Admitting: Student

## 2020-07-12 NOTE — Progress Notes (Signed)
[name] Date of Birth: 10/14/1932 Medical Record #[id number]  History of Present Illness: [name] is seen today for follow up. She has multiple medical issues which include a cardiomyopathy, underlying BiV/ICD which resulted in improvement of her EF, LBBB, HTN, thyroid disease, melanoma, anxiety/depression and chronic systolic HF.   In September 2018 she was noted in device clinic to have paroxysmal Afib with controlled rate. She was started on Eliquis. [place] Vasc score of at least 4. She developed itching on Eliquis and switched to Xarelto. Itching did not go away so she was switched to Coumadin. After about a month the itching resolved.  She was later switched back to Xarelto without rash. She had change out of her ICD generator in November due to ERI. Her device was downgraded to Biventricular pacing only. Follow up with Dr Lovena Le  In February showed normal device function.  She was seen in the office on 04/28/20 with complaints of increased SOB, edema and fatigue. Was found to be in Afib with rate 125. Patient admitted from the office with acute on chronic CHF felt to be secondary to atrial fibrillation with RVR. BNP elevated at 1,299. Chest x-ray consistent with CHF with mild pulmonary edema and small bilateral pleural effusions. Echo showed LVEF of 25-30% (down from 55-60% in 06/2019) with global hypokinesis and moderate to severe MR. RV mildly enlarged with mildly reduced systolic function, severe TR, and moderately elevated PASP. Also showed small pericardial effusion. Patient was diuresed with IV Lasix. Net negative 8.078 L during admission. Discharge weight 117lb but this may not be accurate (was 130.5lbs on 05/19/2020). Was discharged on Lasix 40mg  daily, Losartan 25mg  daily (rather than home Trandolapril), and Toprol-XL 25mg  daily (rather than home Lopressor). Did not add spironolactone  given soft BP at times. Patient also does not want to be on a lot of new medications. She was  considered for Tikosyn but did not want to start this. She was started on amiodarone. Drop in EF felt to be secondary to atrial fibrillation with RVR. Given advance age, it was recommended to treat atrial fibrillation and heart failure medically and then reassess EF in 3 months. No ischemic evaluation felt to be necessary this admission.    On follow up today she notes some swelling mostly in her right leg. Not as bad as before. Weight at home went up 3 lbs. She doubled her lasix for 3 days and weight came back down. No increase dyspnea or palpitations. No orthopnea or PND.  Current Outpatient Medications  Medication Sig Dispense Refill  . acetaminophen (TYLENOL) 500 MG tablet Take 1,000 mg by mouth every 8 (eight) hours as needed for moderate pain or mild pain.    Marland Kitchen amiodarone (PACERONE) 200 MG tablet Take 1 tablet (200 mg total) by mouth daily. 90 tablet 3  . Ascorbic Acid (VITAMIN C) 1000 MG tablet Take 1,000 mg by mouth at bedtime. Ester C    . Biotin 5000 MCG TABS Take 5,000 mcg by mouth daily.    Marland Kitchen buPROPion (WELLBUTRIN XL) 150 MG 24 hr tablet TAKE 1 TABLET BY MOUTH EVERY DAY 90 tablet 1  . Calcium Carbonate-Vitamin D (CALCIUM + D PO) Take 2 tablets by mouth daily. 1000 units Vitamin D 1200 mg calcium    . Cyanocobalamin (VITAMIN B 12) 500 MCG TABS Take 1,000 mcg by mouth daily.     Marland Kitchen estradiol (ESTRACE) 0.1 MG/GM vaginal cream Place 1 g vaginally 2 (two) times a week.    Marland Kitchen  FERREX 150 150 MG capsule TAKE 1 CAPSULE BY MOUTH EVERY DAY 90 capsule 1  . fexofenadine (ALLEGRA) 180 MG tablet Take 180 mg by mouth daily.    . furosemide (LASIX) 40 MG tablet TAKE 1 TABLET (40 MG TOTAL) BY MOUTH DAILY. 30 tablet 2  . levothyroxine (SYNTHROID) 100 MCG tablet Take 1 tablet (100 mcg total) by mouth daily before breakfast. 90 tablet 1  . losartan (COZAAR) 25 MG tablet TAKE 1 TABLET BY MOUTH EVERY DAY 30 tablet 11  . metoprolol succinate (TOPROL-XL) 25 MG 24 hr tablet TAKE 1 TABLET BY MOUTH EVERY DAY 30  tablet 11  . Polyethyl Glycol-Propyl Glycol (SYSTANE) 0.4-0.3 % SOLN Place 1 drop into both eyes daily as needed (dry eyes).    . RABEprazole (ACIPHEX) 20 MG tablet Take one tablet by mouth once daily for stomach 90 tablet 1  . Rivaroxaban (XARELTO) 15 MG TABS tablet TAKE 1 TABLET DAILY WITH   SUPPER (DOSE REDUCED) (Patient taking differently: TAKE 1 TABLET DAILY WITH   SUPPER (DOSE REDUCED)) 90 tablet 3   No current facility-administered medications for this visit.    Allergies  Allergen Reactions  . Hydrocodone Itching  . Cephalexin Itching and Swelling  . Ciprofloxacin Other (See Comments)    Not indicated due to aneurysm in aorta    . Doxycycline Other (See Comments)    Unknown reaction  . Escitalopram Other (See Comments)    Dry mouth  . Levofloxacin Other (See Comments)    Not indicated due to aneurysm in aorta   . Moxifloxacin Other (See Comments)    Not indicated due to aneurysm in aorta   . Ofloxacin Other (See Comments)    Unknown reaction  . Sertraline Other (See Comments)    insomnia  . Sulfamethoxazole Hives    Any sulfa meds.  . Tape Other (See Comments)    Burns skin.   . Latex Rash    "If pt. Makes contact with or wearing"  . Neomycin Rash    Past Medical History:  Diagnosis Date  . AAA (abdominal aortic aneurysm) (HCC)    2.8 cm 05/2018; 5 year [place] per ACR guidelines  . Allergy    all year   . Anxiety disorder   . Arthritis   . Barrett's esophagus 04/2008  . Benign neoplasm of colon   . Blood transfusion without reported diagnosis    long ago per pt  . Cataract    removed both eyes  . CHF (congestive heart failure) ([place])   . Chronic airway obstruction, not elsewhere classified   . Complication of anesthesia    slow to wake up  . Depressive disorder, not elsewhere classified   . Diverticulitis   . Dysthymic disorder   . Erosive gastritis 08/2004  . GERD (gastroesophageal reflux disease)   . Hair thinning   . HTN (hypertension)    pt denies   .  Hyperplastic polyps of stomach 06/2002  . Hypothyroidism   . ICD (implantable cardiac defibrillator) in place    BiV/ICD; s/p removal of ICD with insertion of BiV PPM 04/26/12  . Incisional hernia   . Insomnia 08/20/2015  . Kyphosis 08/20/2015  . LBBB (left bundle branch block)   . Melanoma ([place])    right arm  . Mitral regurgitation   . Nonischemic cardiomyopathy ([place])    EF initially 20%; Last measurement up to 50% per echo in August of 2012  . Osteopenia   . Other and unspecified hyperlipidemia   .  Other and unspecified hyperlipidemia   . Pacemaker 04/26/2012  . PAF (paroxysmal atrial fibrillation) ([place])    11/2016  . Pain in joint, lower leg   . Palpitations   . Pneumonia   . Restless legs syndrome (RLS)   . Synovial cyst of popliteal space   . Unspecified chronic bronchitis ([place])   . Unspecified nasal polyp   . Unspecified sinusitis (chronic)     Past Surgical History:  Procedure Laterality Date  . ABDOMINAL HYSTERECTOMY  1076   endometriosis  . APPENDECTOMY    . BI-VENTRICULAR PACEMAKER INSERTION N/A 04/26/2012   Procedure: BI-VENTRICULAR PACEMAKER INSERTION (CRT-P);  Surgeon: Evans Lance, MD;  Location: [facility] CATH LAB;  Service: Cardiovascular;  Laterality: N/A;  . BIV PACEMAKER GENERATOR CHANGEOUT N/A 01/26/2019   Procedure: BIV PACEMAKER GENERATOR CHANGEOUT;  Surgeon: Evans Lance, MD;  Location: [place] CV LAB;  Service: Cardiovascular;  Laterality: N/A;  . CARDIOVERSION N/A 05/06/2020   Procedure: CARDIOVERSION;  Surgeon: Pixie Casino, MD;  Location: [place];  Service: Cardiovascular;  Laterality: N/A;  . CARDIOVERSION N/A 05/19/2020   Procedure: CARDIOVERSION;  Surgeon: Jerline Pain, MD;  Location: [facility] ENDOSCOPY;  Service: Cardiovascular;  Laterality: N/A;  . COLONOSCOPY    . defibrillator insertion     s/p removal of previously implanted BiV ICD and insertion of a new BiV pacemaker on 04/26/12  . excise mole of lip  2001   Dr. Larena Sox  . excision of wen   1984   Dr. Parks Ranger  . FEMORAL HERNIA REPAIR  12/25/2012   Dr Dalbert Batman  . FEMORAL HERNIA REPAIR  01/04/2013   Recurrent - Dr. Lilyan Punt  . INCISIONAL HERNIA REPAIR N/A 10/01/2013   Procedure: LAPAROSCOPIC INCISIONAL HERNIA;  Surgeon: Gayland Curry, MD;  Location: [facility];  Service: General;  Laterality: N/A;  . INCISIONAL HERNIA REPAIR N/A 09/07/2018   Procedure: LAPAROSCOPIC ASSISTED REPAIR OF INCISIONAL HERNIA;  Surgeon: Greer Pickerel, MD;  Location: [place];  Service: General;  Laterality: N/A;  . INGUINAL HERNIA REPAIR Right 09/26/2012   Procedure: RIGHT INGUINAL HERNIA REPAIR WITH MESH;  Surgeon: Odis Hollingshead, MD;  Location: [place];  Service: General;  Laterality: Right;  . INGUINAL HERNIA REPAIR Right 12/25/2012   Procedure: explor right groin, small bowel rescection, tissue repair right femoral hernia;  Surgeon: Adin Hector, MD;  Location: [facility];  Service: General;  Laterality: Right;  . INSERTION OF MESH Right 09/26/2012   Procedure: INSERTION OF MESH;  Surgeon: Odis Hollingshead, MD;  Location: [place];  Service: General;  Laterality: Right;  . INSERTION OF MESH N/A 10/01/2013   Procedure: INSERTION OF MESH;  Surgeon: Gayland Curry, MD;  Location: [facility];  Service: General;  Laterality: N/A;  . LAPAROSCOPY N/A 01/04/2013   Procedure: Diagnostic Laparoscopy, exploratory laparotomy with small bowel resection, closure of right femoral hernia repair;  Surgeon: Madilyn Hook, DO;  Location: [facility];  Service: General;  Laterality: N/A;  . Left arm surgery    . POLYPECTOMY    . RIGHT BREAST LUMPECTOMY  1988   Dr. Marylene Buerger  . right knee surgery     arthroscopy: Dr. Shellia Carwin  . TONSILLECTOMY    . UPPER GASTROINTESTINAL ENDOSCOPY      Social History   Tobacco Use  Smoking Status Former Smoker  . Packs/day: 1.00  . Years: 15.00  . Pack years: 15.00  . Types: Cigarettes  . Quit date: 12/22/1993  . Years since quitting: 39.5  Smokeless Tobacco Never Used  Tobacco Comment    Does'nt reall year quit     Social History   Substance and Sexual Activity  Alcohol Use No  . Alcohol/week: 0.0 standard drinks    Family History  Problem Relation Age of Onset  . Stroke Father   . Heart disease Other        maternal side  . Colon cancer Paternal Uncle   . Breast cancer Maternal Aunt   . Colon cancer Cousin   . Diabetes Neg Hx   . Colon polyps Neg Hx   . Rectal cancer Neg Hx   . Stomach cancer Neg Hx     Review of Systems: The review of systems is per the HPI.   All other systems were reviewed and are negative.  Physical Exam: BP 128/68 (BP Location: Right Arm, Patient Position: Sitting, Cuff Size: Normal)   Pulse (!) 56   Ht 5\' 8"  (1.727 m)   Wt 126 lb (57.2 kg)   BMI 19.16 kg/m  GENERAL:  thin, elderly thin WF in NAD HEENT:  PERRL, EOMI, sclera are clear. Oropharynx is clear. NECK:  No JVD, carotid upstroke brisk and symmetric, no bruits, no thyromegaly or adenopathy LUNGS:  Clear to auscultation bilaterally CHEST:  Unremarkable HEART:  RRR,  PMI not displaced or sustained,S1 and S2 within normal limits, no S3, no S4: no clicks, no rubs, no murmurs ABD:  Soft, nontender. BS +, no masses or bruits. No hepatomegaly, no splenomegaly EXT:  2 + pulses throughout, tr edema right leg only.  no cyanosis no clubbing SKIN:  Warm and dry.  No rashes NEURO:  Alert and oriented x 3. Cranial nerves II through XII intact. PSYCH:  Cognitively intact   Wt Readings from Last 3 Encounters:  07/18/20 126 lb (57.2 kg)  06/24/20 124 lb 9.6 oz (56.5 kg)  06/18/20 124 lb 3.2 oz (56.3 kg)     LABORATORY DATA: Lab Results  Component Value Date   WBC 9.3 05/19/2020   HGB 11.3 (L) 05/19/2020   HCT 34.5 (L) 05/19/2020   PLT 213 05/19/2020   GLUCOSE 101 (H) 05/20/2020   CHOL 159 11/22/2019   TRIG 137 11/22/2019   HDL 53 11/22/2019   LDLCALC 82 11/22/2019   ALT 11 11/22/2019   AST 19 11/22/2019   NA 139 05/20/2020   K 3.5 05/20/2020   CL 99 05/20/2020    CREATININE 1.11 (H) 05/20/2020   BUN 25 (H) 05/20/2020   CO2 33 (H) 05/20/2020   TSH 2.784 05/17/2020   INR 1.7 (H) 05/18/2020     Echo: 07/22/16: Study Conclusions  - Left ventricle: The cavity size was mildly dilated. Wall   thickness was normal. Systolic function was normal. The estimated   ejection fraction was in the range of 50% to 55%. Wall motion was   normal; there were no regional wall motion abnormalities. - Aortic valve: There was trivial regurgitation. - Mitral valve: There was mild regurgitation. - Atrial septum: No defect or patent foramen ovale was identified.  Echo 06/29/19: IMPRESSIONS    1. Left ventricular ejection fraction, by estimation, is 55 to 60%. The  left ventricle has normal function. The left ventricle has no regional  wall motion abnormalities. Left ventricular diastolic parameters are  consistent with Grade II diastolic  dysfunction (pseudonormalization). Elevated left atrial pressure.  2. Right ventricular systolic function is normal. The right ventricular  size is normal. There is moderately elevated pulmonary artery systolic  pressure.  3. Left atrial size was moderately dilated.  4. The mitral valve is normal in structure. Mild to moderate mitral valve  regurgitation.  5. Tricuspid valve regurgitation is mild to moderate.  6. The aortic valve is tricuspid. Aortic valve regurgitation is mild. No  aortic stenosis is present.  7. Aortic dilatation noted. There is borderline dilatation of the  ascending aorta measuring 37 mm.  8. The inferior vena cava is dilated in size with >50% respiratory  variability, suggesting right atrial pressure of 8 mmHg.    Echocardiogram 05/17/2020: Impressions: 1. Left ventricular ejection fraction, by estimation, is 25 to 30%. The  left ventricle has severely decreased function. The left ventricle  demonstrates global hypokinesis. Left ventricular diastolic function could  not be evaluated.  2.  Right ventricular systolic function is mildly reduced. The right  ventricular size is mildly enlarged. There is moderately elevated  pulmonary artery systolic pressure. The estimated right ventricular  systolic pressure is 70.0 mmHg.  3. Left atrial size was severely dilated.  4. Right atrial size was mildly dilated.  5. A small pericardial effusion is present. The pericardial effusion is  circumferential.  6. The mitral valve is grossly normal. Moderate to severe mitral valve  regurgitation. No evidence of mitral stenosis.  7. Tricuspid valve regurgitation is severe.  8. The aortic valve is tricuspid. Aortic valve regurgitation is not  visualized. No aortic stenosis is present.  9. The inferior vena cava is dilated in size with <50% respiratory  variability, suggesting right atrial pressure of 15 mmHg.   Comparison(s): Changes from prior study are noted. EF now severely  reduced. Moderately dilated RV. Moderate to severe MR. Severe TR. RVSP ~49  mmHG.   Assessment / Plan: 1.  History of dialted Cardiomyopathy with chronic systolic CHF.  EF last normal in April 2021. Has BiV pacemaker in place. Recent  significant worsening of CHF. EF down to 25-30%.  Suspect this is mostly related to her Afib with RVR. Clinically improved.  Weight is stable  Swelling is minimal today. Maintaining NSR on exam. Continue losartan, lasix, Toprol. Will add aldactone 25 mg daily. Stop potassium supplement. Check lab work next week CBC, CMET, TSH. Follow up OV July 1. Plan to Repeat Echo in July.   2. NSVT. Follow up ICD/biV in device clinic. S/p generator change out in November 2020. Normal device function.  3. Paroxysmal Afib. Now persistent and ERAD post cardioversion on March 1. RVR. Repeat DCCV post amiodarone. Now in NSR. [place] Vasc score of at least 4. On Xarelto. Needs to stay on amiodarone 200 mg daily. Will check CMET and TSH next week  4. Small AAA and left iliac aneurysm. Follow up with [facility].  Last doppler showed AAA was 2.9 cm.

## 2020-07-14 NOTE — Telephone Encounter (Signed)
This is Dr. Jordan's pt. °

## 2020-07-17 ENCOUNTER — Ambulatory Visit: Payer: Medicare Other | Admitting: Cardiology

## 2020-07-18 ENCOUNTER — Other Ambulatory Visit: Payer: Self-pay

## 2020-07-18 ENCOUNTER — Ambulatory Visit (INDEPENDENT_AMBULATORY_CARE_PROVIDER_SITE_OTHER): Payer: Medicare Other | Admitting: Cardiology

## 2020-07-18 ENCOUNTER — Encounter: Payer: Self-pay | Admitting: Cardiology

## 2020-07-18 VITALS — BP 128/68 | HR 56 | Ht 68.0 in | Wt 126.0 lb

## 2020-07-18 DIAGNOSIS — I4819 Other persistent atrial fibrillation: Secondary | ICD-10-CM

## 2020-07-18 DIAGNOSIS — I447 Left bundle-branch block, unspecified: Secondary | ICD-10-CM | POA: Diagnosis not present

## 2020-07-18 DIAGNOSIS — Z950 Presence of cardiac pacemaker: Secondary | ICD-10-CM

## 2020-07-18 DIAGNOSIS — I5043 Acute on chronic combined systolic (congestive) and diastolic (congestive) heart failure: Secondary | ICD-10-CM | POA: Diagnosis not present

## 2020-07-18 DIAGNOSIS — I2584 Coronary atherosclerosis due to calcified coronary lesion: Secondary | ICD-10-CM

## 2020-07-18 DIAGNOSIS — I2510 Atherosclerotic heart disease of native coronary artery without angina pectoris: Secondary | ICD-10-CM | POA: Diagnosis not present

## 2020-07-18 MED ORDER — SPIRONOLACTONE 25 MG PO TABS: 25.0000 mg | ORAL_TABLET | Freq: Every day | ORAL | 3 refills | Status: DC

## 2020-07-18 NOTE — Addendum Note (Signed)
Addended by: Kathyrn Lass on: 07/18/2020 04:30 PM   Modules accepted: Orders

## 2020-07-18 NOTE — Patient Instructions (Signed)
Stop taking potassium  We will add aldactone 25 mg daily  We will arrange lab work next week  Keep appointment on July 1.

## 2020-07-24 ENCOUNTER — Other Ambulatory Visit: Payer: Self-pay | Admitting: *Deleted

## 2020-07-24 DIAGNOSIS — I5043 Acute on chronic combined systolic (congestive) and diastolic (congestive) heart failure: Secondary | ICD-10-CM | POA: Diagnosis not present

## 2020-07-24 DIAGNOSIS — I4819 Other persistent atrial fibrillation: Secondary | ICD-10-CM | POA: Diagnosis not present

## 2020-07-24 DIAGNOSIS — I447 Left bundle-branch block, unspecified: Secondary | ICD-10-CM | POA: Diagnosis not present

## 2020-07-24 DIAGNOSIS — Z950 Presence of cardiac pacemaker: Secondary | ICD-10-CM | POA: Diagnosis not present

## 2020-07-24 MED ORDER — RABEPRAZOLE SODIUM 20 MG PO TBEC: DELAYED_RELEASE_TABLET | ORAL | 1 refills | Status: DC

## 2020-07-24 NOTE — Telephone Encounter (Signed)
Patient requested Refill to be sent to Mail Order.

## 2020-07-25 ENCOUNTER — Other Ambulatory Visit: Payer: Self-pay

## 2020-07-25 DIAGNOSIS — I10 Essential (primary) hypertension: Secondary | ICD-10-CM

## 2020-07-25 DIAGNOSIS — I5023 Acute on chronic systolic (congestive) heart failure: Secondary | ICD-10-CM

## 2020-07-25 LAB — COMPREHENSIVE METABOLIC PANEL
ALT: 11 IU/L (ref 0–32)
AST: 14 IU/L (ref 0–40)
Albumin/Globulin Ratio: 2 (ref 1.2–2.2)
Albumin: 4.1 g/dL (ref 3.6–4.6)
Alkaline Phosphatase: 87 IU/L (ref 44–121)
BUN/Creatinine Ratio: 27 (ref 12–28)
BUN: 40 mg/dL — ABNORMAL HIGH (ref 8–27)
Bilirubin Total: 0.3 mg/dL (ref 0.0–1.2)
CO2: 20 mmol/L (ref 20–29)
Calcium: 8.7 mg/dL (ref 8.7–10.3)
Chloride: 101 mmol/L (ref 96–106)
Creatinine, Ser: 1.49 mg/dL — ABNORMAL HIGH (ref 0.57–1.00)
Globulin, Total: 2.1 g/dL (ref 1.5–4.5)
Glucose: 72 mg/dL (ref 65–99)
Potassium: 4.6 mmol/L (ref 3.5–5.2)
Sodium: 137 mmol/L (ref 134–144)
Total Protein: 6.2 g/dL (ref 6.0–8.5)
eGFR: 34 mL/min/{1.73_m2} — ABNORMAL LOW (ref >59)

## 2020-07-25 LAB — CBC WITH DIFFERENTIAL/PLATELET
Basophils Absolute: 0 10*3/uL (ref 0.0–0.2)
Basos: 0 %
EOS (ABSOLUTE): 0.3 10*3/uL (ref 0.0–0.4)
Eos: 4 %
Hematocrit: 31.5 % — ABNORMAL LOW (ref 34.0–46.6)
Hemoglobin: 10.4 g/dL — ABNORMAL LOW (ref 11.1–15.9)
Immature Grans (Abs): 0 10*3/uL (ref 0.0–0.1)
Immature Granulocytes: 0 %
Lymphocytes Absolute: 0.9 10*3/uL (ref 0.7–3.1)
Lymphs: 14 %
MCH: 29.2 pg (ref 26.6–33.0)
MCHC: 33 g/dL (ref 31.5–35.7)
MCV: 89 fL (ref 79–97)
Monocytes Absolute: 0.9 10*3/uL (ref 0.1–0.9)
Monocytes: 13 %
Neutrophils Absolute: 4.6 10*3/uL (ref 1.4–7.0)
Neutrophils: 69 %
Platelets: 186 10*3/uL (ref 150–450)
RBC: 3.56 x10E6/uL — ABNORMAL LOW (ref 3.77–5.28)
RDW: 15.4 % (ref 11.7–15.4)
WBC: 6.7 10*3/uL (ref 3.4–10.8)

## 2020-07-25 LAB — TSH: TSH: 4.13 u[IU]/mL (ref 0.450–4.500)

## 2020-07-25 NOTE — Progress Notes (Signed)
bmet  

## 2020-07-29 ENCOUNTER — Other Ambulatory Visit: Payer: Self-pay

## 2020-07-29 DIAGNOSIS — I714 Abdominal aortic aneurysm, without rupture, unspecified: Secondary | ICD-10-CM

## 2020-08-15 DIAGNOSIS — I10 Essential (primary) hypertension: Secondary | ICD-10-CM | POA: Diagnosis not present

## 2020-08-15 DIAGNOSIS — I5023 Acute on chronic systolic (congestive) heart failure: Secondary | ICD-10-CM | POA: Diagnosis not present

## 2020-08-16 LAB — BASIC METABOLIC PANEL
BUN/Creatinine Ratio: 23 (ref 12–28)
BUN: 37 mg/dL — ABNORMAL HIGH (ref 8–27)
CO2: 19 mmol/L — ABNORMAL LOW (ref 20–29)
Calcium: 9.2 mg/dL (ref 8.7–10.3)
Chloride: 103 mmol/L (ref 96–106)
Creatinine, Ser: 1.63 mg/dL — ABNORMAL HIGH (ref 0.57–1.00)
Glucose: 75 mg/dL (ref 65–99)
Potassium: 5.2 mmol/L (ref 3.5–5.2)
Sodium: 137 mmol/L (ref 134–144)
eGFR: 31 mL/min/{1.73_m2} — ABNORMAL LOW (ref >59)

## 2020-08-18 ENCOUNTER — Other Ambulatory Visit: Payer: Self-pay

## 2020-08-18 DIAGNOSIS — I10 Essential (primary) hypertension: Secondary | ICD-10-CM

## 2020-08-18 DIAGNOSIS — I5022 Chronic systolic (congestive) heart failure: Secondary | ICD-10-CM

## 2020-09-01 DIAGNOSIS — I10 Essential (primary) hypertension: Secondary | ICD-10-CM | POA: Diagnosis not present

## 2020-09-01 DIAGNOSIS — I5022 Chronic systolic (congestive) heart failure: Secondary | ICD-10-CM | POA: Diagnosis not present

## 2020-09-01 NOTE — Progress Notes (Signed)
[name] Date of Birth: December 05, 1932 Medical Record #[id number]  History of Present Illness: [name] is seen today for follow up. She has multiple medical issues which include a cardiomyopathy, underlying BiV/ICD which resulted in improvement of her EF, LBBB, HTN, thyroid disease, melanoma, anxiety/depression and chronic systolic HF.   In September 2018 she was noted in device clinic to have paroxysmal Afib with controlled rate. She was started on Eliquis. [place] Vasc score of at least 4. She developed itching on Eliquis and switched to Xarelto. Itching did not go away so she was switched to Coumadin. After about a month the itching resolved.  She was later switched back to Xarelto without rash. She had change out of her ICD generator in November due to ERI. Her device was downgraded to Biventricular pacing only. Follow up with Dr Lovena Le  In February showed normal device function.  She was seen in the office on 04/28/20 with complaints of increased SOB, edema and fatigue. Was found to be in Afib with rate 125. Patient admitted from the office with acute on chronic CHF felt to be secondary to atrial fibrillation with RVR. BNP elevated at 1,299. Chest x-ray consistent with CHF with mild pulmonary edema and small bilateral pleural effusions. Echo showed LVEF of 25-30% (down from 55-60% in 06/2019) with global hypokinesis and moderate to severe MR. RV mildly enlarged with mildly reduced systolic function, severe TR, and moderately elevated PASP. Also showed small pericardial effusion. Patient was diuresed with IV Lasix. Net negative 8.078 L during admission. Discharge weight 117lb but this may not be accurate (was 130.5lbs on 05/19/2020). Was discharged on Lasix 40mg  daily, Losartan 25mg  daily (rather than home Trandolapril), and Toprol-XL 25mg  daily (rather than home Lopressor). Did not add spironolactone  given soft BP at times. Patient also does not want to be on a lot of new medications. She was  considered for Tikosyn but did not want to start this. She was started on amiodarone.  Drop in EF felt to be secondary to atrial fibrillation with RVR. Given advance age, it was recommended to treat atrial fibrillation and heart failure medically and then reassess EF in 3 months. No ischemic evaluation felt to be necessary this admission.     On her last visit we added aldactone 25 mg daily. This resulted in  increase in creatinine and potassium aldactone was discontinued. She states she stays sleepy. Feels depressed. Worried about dying. No palpitations. Weight is stable. No edema.   Current Outpatient Medications  Medication Sig Dispense Refill   acetaminophen (TYLENOL) 500 MG tablet Take 1,000 mg by mouth every 8 (eight) hours as needed for moderate pain or mild pain.     amiodarone (PACERONE) 200 MG tablet Take 1 tablet (200 mg total) by mouth daily. 90 tablet 3   Ascorbic Acid (VITAMIN C) 1000 MG tablet Take 1,000 mg by mouth at bedtime. Ester C     Biotin 5000 MCG TABS Take 5,000 mcg by mouth daily.     buPROPion (WELLBUTRIN XL) 150 MG 24 hr tablet TAKE 1 TABLET BY MOUTH EVERY DAY 90 tablet 1   Calcium Carbonate-Vitamin D (CALCIUM + D PO) Take 2 tablets by mouth daily. 1000 units Vitamin D 1200 mg calcium     Cyanocobalamin (VITAMIN B 12) 500 MCG TABS Take 1,000 mcg by mouth daily.      estradiol (ESTRACE) 0.1 MG/GM vaginal cream Place 1 g vaginally 2 (two) times a week.     FERREX 150 150  MG capsule TAKE 1 CAPSULE BY MOUTH EVERY DAY 90 capsule 1   fexofenadine (ALLEGRA) 180 MG tablet Take 180 mg by mouth daily.     furosemide (LASIX) 40 MG tablet Take 1/2 tablet ( 20 mg ) daily 90 tablet 3   levothyroxine (SYNTHROID) 100 MCG tablet Take 1 tablet (100 mcg total) by mouth daily before breakfast. 90 tablet 1   losartan (COZAAR) 25 MG tablet TAKE 1 TABLET BY MOUTH EVERY DAY 30 tablet 11   metoprolol succinate (TOPROL-XL) 25 MG 24 hr tablet TAKE 1 TABLET BY MOUTH EVERY DAY 30 tablet 11    Polyethyl Glycol-Propyl Glycol (SYSTANE) 0.4-0.3 % SOLN Place 1 drop into both eyes daily as needed (dry eyes).     RABEprazole (ACIPHEX) 20 MG tablet Take one tablet by mouth once daily for stomach 90 tablet 1   Rivaroxaban (XARELTO) 15 MG TABS tablet TAKE 1 TABLET DAILY WITH   SUPPER (DOSE REDUCED) (Patient taking differently: TAKE 1 TABLET DAILY WITH   SUPPER (DOSE REDUCED)) 90 tablet 3   No current facility-administered medications for this visit.    Allergies  Allergen Reactions   Hydrocodone Itching   Cephalexin Itching and Swelling   Ciprofloxacin Other (See Comments)    Not indicated due to aneurysm in aorta     Doxycycline Other (See Comments)    Unknown reaction   Escitalopram Other (See Comments)    Dry mouth   Levofloxacin Other (See Comments)    Not indicated due to aneurysm in aorta    Moxifloxacin Other (See Comments)    Not indicated due to aneurysm in aorta    Ofloxacin Other (See Comments)    Unknown reaction   Sertraline Other (See Comments)    insomnia   Sulfamethoxazole Hives    Any sulfa meds.   Tape Other (See Comments)    Burns skin.    Latex Rash    "If pt. Makes contact with or wearing"   Neomycin Rash    Past Medical History:  Diagnosis Date   AAA (abdominal aortic aneurysm) (HCC)    2.8 cm 05/2018; 5 year [place] per ACR guidelines   Allergy    all year    Anxiety disorder    Arthritis    Barrett's esophagus 04/2008   Benign neoplasm of colon    Blood transfusion without reported diagnosis    long ago per pt   Cataract    removed both eyes   CHF (congestive heart failure) ([place])    Chronic airway obstruction, not elsewhere classified    Complication of anesthesia    slow to wake up   Depressive disorder, not elsewhere classified    Diverticulitis    Dysthymic disorder    Erosive gastritis 08/2004   GERD (gastroesophageal reflux disease)    Hair thinning    HTN (hypertension)    pt denies    Hyperplastic polyps of stomach 06/2002    Hypothyroidism    ICD (implantable cardiac defibrillator) in place    BiV/ICD; s/p removal of ICD with insertion of BiV PPM 04/26/12   Incisional hernia    Insomnia 08/20/2015   Kyphosis 08/20/2015   LBBB (left bundle branch block)    Melanoma (HCC)    right arm   Mitral regurgitation    Nonischemic cardiomyopathy ([place])    EF initially 20%; Last measurement up to 50% per echo in August of 2012   Osteopenia    Other and unspecified hyperlipidemia    Other and  unspecified hyperlipidemia    Pacemaker 04/26/2012   PAF (paroxysmal atrial fibrillation) ([place])    11/2016   Pain in joint, lower leg    Palpitations    Pneumonia    Restless legs syndrome (RLS)    Synovial cyst of popliteal space    Unspecified chronic bronchitis (HCC)    Unspecified nasal polyp    Unspecified sinusitis (chronic)     Past Surgical History:  Procedure Laterality Date   ABDOMINAL HYSTERECTOMY  1076   endometriosis   APPENDECTOMY     BI-VENTRICULAR PACEMAKER INSERTION N/A 04/26/2012   Procedure: BI-VENTRICULAR PACEMAKER INSERTION (CRT-P);  Surgeon: Evans Lance, MD;  Location: [facility] CATH LAB;  Service: Cardiovascular;  Laterality: N/A;   BIV PACEMAKER GENERATOR CHANGEOUT N/A 01/26/2019   Procedure: BIV PACEMAKER GENERATOR CHANGEOUT;  Surgeon: Evans Lance, MD;  Location: [place] CV LAB;  Service: Cardiovascular;  Laterality: N/A;   CARDIOVERSION N/A 05/06/2020   Procedure: CARDIOVERSION;  Surgeon: Pixie Casino, MD;  Location: [place];  Service: Cardiovascular;  Laterality: N/A;   CARDIOVERSION N/A 05/19/2020   Procedure: CARDIOVERSION;  Surgeon: Jerline Pain, MD;  Location: [facility] ENDOSCOPY;  Service: Cardiovascular;  Laterality: N/A;   COLONOSCOPY     defibrillator insertion     s/p removal of previously implanted BiV ICD and insertion of a new BiV pacemaker on 04/26/12   excise mole of lip  2001   Dr. Larena Sox   excision of wen  1984   Dr. Parks Ranger   FEMORAL HERNIA REPAIR  12/25/2012   Dr  Dalbert Batman   FEMORAL HERNIA REPAIR  01/04/2013   Recurrent - Dr. Nash Mantis HERNIA REPAIR N/A 10/01/2013   Procedure: LAPAROSCOPIC INCISIONAL HERNIA;  Surgeon: Gayland Curry, MD;  Location: [facility];  Service: General;  Laterality: N/A;   INCISIONAL HERNIA REPAIR N/A 09/07/2018   Procedure: LAPAROSCOPIC ASSISTED REPAIR OF INCISIONAL HERNIA;  Surgeon: Greer Pickerel, MD;  Location: [place];  Service: General;  Laterality: N/A;   INGUINAL HERNIA REPAIR Right 09/26/2012   Procedure: RIGHT INGUINAL HERNIA REPAIR WITH MESH;  Surgeon: Odis Hollingshead, MD;  Location: [place];  Service: General;  Laterality: Right;   INGUINAL HERNIA REPAIR Right 12/25/2012   Procedure: explor right groin, small bowel rescection, tissue repair right femoral hernia;  Surgeon: Adin Hector, MD;  Location: [facility];  Service: General;  Laterality: Right;   INSERTION OF MESH Right 09/26/2012   Procedure: INSERTION OF MESH;  Surgeon: Odis Hollingshead, MD;  Location: [place];  Service: General;  Laterality: Right;   INSERTION OF MESH N/A 10/01/2013   Procedure: INSERTION OF MESH;  Surgeon: Gayland Curry, MD;  Location: [facility];  Service: General;  Laterality: N/A;   LAPAROSCOPY N/A 01/04/2013   Procedure: Diagnostic Laparoscopy, exploratory laparotomy with small bowel resection, closure of right femoral hernia repair;  Surgeon: Madilyn Hook, DO;  Location: [facility];  Service: General;  Laterality: N/A;   Left arm surgery     POLYPECTOMY     RIGHT BREAST LUMPECTOMY  1988   Dr. Marylene Buerger   right knee surgery     arthroscopy: Dr. Shellia Carwin   TONSILLECTOMY     UPPER GASTROINTESTINAL ENDOSCOPY      Social History   Tobacco Use  Smoking Status Former   Packs/day: 1.00   Years: 15.00   Pack years: 15.00   Types: Cigarettes   Quit date: 12/22/1993   Years since quitting: 26.7  Smokeless Tobacco Never  Tobacco Comments   Does'nt reall year quit     Social History   Substance and Sexual Activity  Alcohol Use No    Alcohol/week: 0.0 standard drinks    Family History  Problem Relation Age of Onset   Stroke Father    Heart disease Other        maternal side   Colon cancer Paternal Uncle    Breast cancer Maternal Aunt    Colon cancer Cousin    Diabetes Neg Hx    Colon polyps Neg Hx    Rectal cancer Neg Hx    Stomach cancer Neg Hx     Review of Systems: The review of systems is per the HPI.   All other systems were reviewed and are negative.  Physical Exam: BP 109/60   Pulse (!) 56   Ht 5\' 8"  (1.727 m)   Wt 126 lb 6.4 oz (57.3 kg)   SpO2 97%   BMI 19.22 kg/m  GENERAL:  thin, elderly thin WF in NAD HEENT:  PERRL, EOMI, sclera are clear. Oropharynx is clear. NECK:  No JVD, carotid upstroke brisk and symmetric, no bruits, no thyromegaly or adenopathy LUNGS:  Clear to auscultation bilaterally CHEST:  Unremarkable HEART:  RRR,  PMI not displaced or sustained,S1 and S2 within normal limits, no S3, no S4: no clicks, no rubs, no murmurs ABD:  Soft, nontender. BS +, no masses or bruits. No hepatomegaly, no splenomegaly EXT:  2 + pulses throughout, tr edema right leg only.  no cyanosis no clubbing SKIN:  Warm and dry.  No rashes NEURO:  Alert and oriented x 3. Cranial nerves II through XII intact. PSYCH:  Cognitively intact   Wt Readings from Last 3 Encounters:  09/05/20 126 lb 6.4 oz (57.3 kg)  07/18/20 126 lb (57.2 kg)  06/24/20 124 lb 9.6 oz (56.5 kg)     LABORATORY DATA: Lab Results  Component Value Date   WBC 6.7 07/24/2020   HGB 10.4 (L) 07/24/2020   HCT 31.5 (L) 07/24/2020   PLT 186 07/24/2020   GLUCOSE 84 09/01/2020   CHOL 159 11/22/2019   TRIG 137 11/22/2019   HDL 53 11/22/2019   LDLCALC 82 11/22/2019   ALT 11 07/24/2020   AST 14 07/24/2020   NA 133 (L) 09/01/2020   K 5.4 (H) 09/01/2020   CL 99 09/01/2020   CREATININE 1.93 (H) 09/01/2020   BUN 46 (H) 09/01/2020   CO2 21 09/01/2020   TSH 4.130 07/24/2020   INR 1.7 (H) 05/18/2020     Echo: 07/22/16: Study  Conclusions   - Left ventricle: The cavity size was mildly dilated. Wall   thickness was normal. Systolic function was normal. The estimated   ejection fraction was in the range of 50% to 55%. Wall motion was   normal; there were no regional wall motion abnormalities. - Aortic valve: There was trivial regurgitation. - Mitral valve: There was mild regurgitation. - Atrial septum: No defect or patent foramen ovale was identified.   Echo 06/29/19: IMPRESSIONS     1. Left ventricular ejection fraction, by estimation, is 55 to 60%. The  left ventricle has normal function. The left ventricle has no regional  wall motion abnormalities. Left ventricular diastolic parameters are  consistent with Grade II diastolic  dysfunction (pseudonormalization). Elevated left atrial pressure.   2. Right ventricular systolic function is normal. The right ventricular  size is normal. There is moderately elevated pulmonary artery systolic  pressure.   3. Left atrial  size was moderately dilated.   4. The mitral valve is normal in structure. Mild to moderate mitral valve  regurgitation.   5. Tricuspid valve regurgitation is mild to moderate.   6. The aortic valve is tricuspid. Aortic valve regurgitation is mild. No  aortic stenosis is present.   7. Aortic dilatation noted. There is borderline dilatation of the  ascending aorta measuring 37 mm.   8. The inferior vena cava is dilated in size with >50% respiratory  variability, suggesting right atrial pressure of 8 mmHg.    Echocardiogram 05/17/2020: Impressions:  1. Left ventricular ejection fraction, by estimation, is 25 to 30%. The  left ventricle has severely decreased function. The left ventricle  demonstrates global hypokinesis. Left ventricular diastolic function could  not be evaluated.   2. Right ventricular systolic function is mildly reduced. The right  ventricular size is mildly enlarged. There is moderately elevated  pulmonary artery systolic  pressure. The estimated right ventricular  systolic pressure is 26.8 mmHg.   3. Left atrial size was severely dilated.   4. Right atrial size was mildly dilated.   5. A small pericardial effusion is present. The pericardial effusion is  circumferential.   6. The mitral valve is grossly normal. Moderate to severe mitral valve  regurgitation. No evidence of mitral stenosis.   7. Tricuspid valve regurgitation is severe.   8. The aortic valve is tricuspid. Aortic valve regurgitation is not  visualized. No aortic stenosis is present.   9. The inferior vena cava is dilated in size with <50% respiratory  variability, suggesting right atrial pressure of 15 mmHg.   Comparison(s): Changes from prior study are noted. EF now severely  reduced. Moderately dilated RV. Moderate to severe MR. Severe TR. RVSP ~49  mmHG.   Assessment / Plan: 1.  History of dialted Cardiomyopathy with chronic systolic CHF.  EF last normal in April 2021. Has BiV pacemaker in place. Recent  significant worsening of CHF. EF down to 25-30%.  Suspect this is mostly related to her Afib with RVR. Clinically improved.  Weight is stable  Swelling is minimal today. Maintaining NSR on exam. Continue losartan, lasix, Toprol. Intolerant of aldactone due to worsening renal function and hyperkalemia. Stop potassium supplement. Will repeat BMET today. Plan to Repeat Echo now. If EF remains low will need to consider an SGLT 2 inhibitor.   2. NSVT. Follow up ICD/biV in device clinic. S/p generator change out in November 2020. Normal device function.  3. Paroxysmal Afib. persistent and ERAD post cardioversion on March 1. RVR. Repeat DCCV post amiodarone. Now in NSR. [place] Vasc score of at least 4. On Xarelto. Needs to stay on amiodarone 200 mg daily. LFTs and TSH OK  4. Small AAA and left iliac aneurysm. Follow up with [facility]. Last doppler showed AAA was 2.9 cm.      Follow up in 3 months.

## 2020-09-02 ENCOUNTER — Other Ambulatory Visit: Payer: Self-pay

## 2020-09-02 DIAGNOSIS — E875 Hyperkalemia: Secondary | ICD-10-CM

## 2020-09-02 DIAGNOSIS — R7989 Other specified abnormal findings of blood chemistry: Secondary | ICD-10-CM

## 2020-09-02 LAB — BASIC METABOLIC PANEL
BUN/Creatinine Ratio: 24 (ref 12–28)
BUN: 46 mg/dL — ABNORMAL HIGH (ref 8–27)
CO2: 21 mmol/L (ref 20–29)
Calcium: 9.1 mg/dL (ref 8.7–10.3)
Chloride: 99 mmol/L (ref 96–106)
Creatinine, Ser: 1.93 mg/dL — ABNORMAL HIGH (ref 0.57–1.00)
Glucose: 84 mg/dL (ref 65–99)
Potassium: 5.4 mmol/L — ABNORMAL HIGH (ref 3.5–5.2)
Sodium: 133 mmol/L — ABNORMAL LOW (ref 134–144)
eGFR: 25 mL/min/{1.73_m2} — ABNORMAL LOW (ref >59)

## 2020-09-05 ENCOUNTER — Encounter: Payer: Self-pay | Admitting: Cardiology

## 2020-09-05 ENCOUNTER — Other Ambulatory Visit: Payer: Self-pay

## 2020-09-05 ENCOUNTER — Ambulatory Visit (INDEPENDENT_AMBULATORY_CARE_PROVIDER_SITE_OTHER): Payer: Medicare Other | Admitting: Cardiology

## 2020-09-05 VITALS — BP 109/60 | HR 56 | Ht 68.0 in | Wt 126.4 lb

## 2020-09-05 DIAGNOSIS — I2584 Coronary atherosclerosis due to calcified coronary lesion: Secondary | ICD-10-CM

## 2020-09-05 DIAGNOSIS — R7989 Other specified abnormal findings of blood chemistry: Secondary | ICD-10-CM | POA: Diagnosis not present

## 2020-09-05 DIAGNOSIS — I2510 Atherosclerotic heart disease of native coronary artery without angina pectoris: Secondary | ICD-10-CM | POA: Diagnosis not present

## 2020-09-05 DIAGNOSIS — E875 Hyperkalemia: Secondary | ICD-10-CM | POA: Diagnosis not present

## 2020-09-05 DIAGNOSIS — I5022 Chronic systolic (congestive) heart failure: Secondary | ICD-10-CM

## 2020-09-05 DIAGNOSIS — I4819 Other persistent atrial fibrillation: Secondary | ICD-10-CM

## 2020-09-05 DIAGNOSIS — I447 Left bundle-branch block, unspecified: Secondary | ICD-10-CM | POA: Diagnosis not present

## 2020-09-05 NOTE — Patient Instructions (Signed)
Medication Instructions:   Continue same medications  *If you need a refill on your cardiac medications before your next appointment, please call your pharmacy*   Lab Work:  None ordered   Testing/Procedures:  Schedule Echo   Follow-Up: At [facility], you and your health needs are our priority.  As part of our continuing mission to provide you with exceptional heart care, we have created designated Provider Care Teams.  These Care Teams include your primary Cardiologist (physician) and Advanced Practice Providers (APPs -  Physician Assistants and Nurse Practitioners) who all work together to provide you with the care you need, when you need it.  We recommend signing up for the patient portal called "MyChart".  Sign up information is provided on this After Visit Summary.  MyChart is used to connect with patients for Virtual Visits (Telemedicine).  Patients are able to view lab/test results, encounter notes, upcoming appointments, etc.  Non-urgent messages can be sent to your provider as well.   To learn more about what you can do with MyChart, go to [email or web address].    Your next appointment:  Thursday 01/01/21 at  1:30 pm   The format for your next appointment: Office    Provider:  Dr.Jordan

## 2020-09-06 LAB — BASIC METABOLIC PANEL
BUN/Creatinine Ratio: 24 (ref 12–28)
BUN/Creatinine Ratio: 25 (ref 12–28)
BUN: 40 mg/dL — ABNORMAL HIGH (ref 8–27)
BUN: 40 mg/dL — ABNORMAL HIGH (ref 8–27)
CO2: 19 mmol/L — ABNORMAL LOW (ref 20–29)
CO2: 20 mmol/L (ref 20–29)
Calcium: 9.1 mg/dL (ref 8.7–10.3)
Calcium: 9.1 mg/dL (ref 8.7–10.3)
Chloride: 101 mmol/L (ref 96–106)
Chloride: 105 mmol/L (ref 96–106)
Creatinine, Ser: 1.64 mg/dL — ABNORMAL HIGH (ref 0.57–1.00)
Creatinine, Ser: 1.57 mg/dL — ABNORMAL HIGH (ref 0.57–1.00)
Glucose: 84 mg/dL (ref 65–99)
Glucose: 80 mg/dL (ref 65–99)
Potassium: 4.6 mmol/L (ref 3.5–5.2)
Potassium: 4.8 mmol/L (ref 3.5–5.2)
Sodium: 137 mmol/L (ref 134–144)
Sodium: 139 mmol/L (ref 134–144)
eGFR: 30 mL/min/{1.73_m2} — ABNORMAL LOW (ref >59)
eGFR: 32 mL/min/{1.73_m2} — ABNORMAL LOW (ref >59)

## 2020-09-13 ENCOUNTER — Other Ambulatory Visit: Payer: Self-pay | Admitting: Cardiology

## 2020-09-23 ENCOUNTER — Encounter: Payer: Self-pay | Admitting: Family Medicine

## 2020-09-23 ENCOUNTER — Other Ambulatory Visit: Payer: Self-pay

## 2020-09-23 ENCOUNTER — Ambulatory Visit (INDEPENDENT_AMBULATORY_CARE_PROVIDER_SITE_OTHER): Payer: Medicare Other | Admitting: Family Medicine

## 2020-09-23 VITALS — BP 118/60 | HR 55 | Temp 97.5°F | Ht 68.0 in | Wt 126.8 lb

## 2020-09-23 DIAGNOSIS — I5023 Acute on chronic systolic (congestive) heart failure: Secondary | ICD-10-CM | POA: Diagnosis not present

## 2020-09-23 DIAGNOSIS — I714 Abdominal aortic aneurysm, without rupture, unspecified: Secondary | ICD-10-CM

## 2020-09-23 DIAGNOSIS — E039 Hypothyroidism, unspecified: Secondary | ICD-10-CM | POA: Diagnosis not present

## 2020-09-23 DIAGNOSIS — F331 Major depressive disorder, recurrent, moderate: Secondary | ICD-10-CM | POA: Diagnosis not present

## 2020-09-23 DIAGNOSIS — I2584 Coronary atherosclerosis due to calcified coronary lesion: Secondary | ICD-10-CM

## 2020-09-23 DIAGNOSIS — I2510 Atherosclerotic heart disease of native coronary artery without angina pectoris: Secondary | ICD-10-CM | POA: Diagnosis not present

## 2020-09-23 DIAGNOSIS — F411 Generalized anxiety disorder: Secondary | ICD-10-CM

## 2020-09-23 NOTE — Patient Instructions (Addendum)
I would like for you to begin the generic WellbutrinSafety In The Home  Use a variety of textures, such as Velcro, rubber bands and raised dots to provide tactile clues.  Apply to the on/off controls on appliances, at the end of the banister, or on medicine bottles.  Flooring  The following suggestions can help reduce the risk of a fall: Repair or replace torn carpet because a foot, cane or walker can easily get caught. Remove area carpets or throw rugs, especially if your loved one has a shuffling gait or uses a walker. When rugs  Or carpeting cannot be eliminated, place non-skid padding under rugs or secure to floor with double sided tape.  Area carpets without padding or tape can easily buckle underneath when walked on, causing a person to slip or fall. Use only matte, non-shiny finishes on the floor. Doorsills can be tripping hazards.  Remove them whenever possible or paint them a contrasting color. Eliminate low furniture that is easy to trip over such as coffee tables and footstools. Move furniture against walls to create a large area of uncluttered space in the center of the room.  However, if you are rearranging a room for someone else, discuss beforehand with that person.  Many individuals rely on specific locations of furniture to find their way around a room. It is easier to see the sofa or chair when its color contrasts with that of the flooring.  Choose a fabric that contrasts with the floor material or use a bright colored piping along the edges of the seat cushion. Reduce glare on polished furniture by covering it with a large doily or tablecloth.

## 2020-09-23 NOTE — Progress Notes (Signed)
Provider:  Alain Honey, MD  Careteam: Patient Care Team: Wardell Honour, MD as PCP - General (Family Medicine) [name], Peter M, MD as PCP - Cardiology (Cardiology) [name], Peter M, MD as Consulting Physician (Cardiology) Milus Banister, MD as Attending Physician (Gastroenterology)  PLACE OF SERVICE:  [place]  Advanced Directive information    Allergies  Allergen Reactions   Hydrocodone Itching   Cephalexin Itching and Swelling   Ciprofloxacin Other (See Comments)    Not indicated due to aneurysm in aorta     Doxycycline Other (See Comments)    Unknown reaction   Escitalopram Other (See Comments)    Dry mouth   Levofloxacin Other (See Comments)    Not indicated due to aneurysm in aorta    Moxifloxacin Other (See Comments)    Not indicated due to aneurysm in aorta    Ofloxacin Other (See Comments)    Unknown reaction   Sertraline Other (See Comments)    insomnia   Sulfamethoxazole Hives    Any sulfa meds.   Tape Other (See Comments)    Burns skin.    Latex Rash    "If pt. Makes contact with or wearing"   Neomycin Rash    Chief Complaint  Patient presents with   Medical Management of Chronic Issues    Patient presents today for a 3 months follow-up.     HPI: Patient is a 87 y.o. female she is currently doing pretty well.  She has had some questions related to her medicines.  There is a certain apathy about her and she has been given Wellbutrin but in generic form and she is reluctant to take the generic medicine.  I encouraged her to begin. She has seen cardiology since her visit here and her medicines have been adjusted so that she is only taking 20 mg of Lasix per day.  She had been on spironolactone but that was discontinued as well as the potassium supplement. She describes some pain in her back and hip as well as left knee.  Review of Systems:  Review of Systems  Respiratory: Negative.    Cardiovascular:  Positive for leg swelling. Negative  for palpitations.  Gastrointestinal: Negative.   Genitourinary: Negative.   Musculoskeletal:  Positive for back pain and joint pain.  Psychiatric/Behavioral:  Positive for depression.   All other systems reviewed and are negative.  Past Medical History:  Diagnosis Date   AAA (abdominal aortic aneurysm) (HCC)    2.8 cm 05/2018; 5 year [place] per ACR guidelines   Allergy    all year    Anxiety disorder    Arthritis    Barrett's esophagus 04/2008   Benign neoplasm of colon    Blood transfusion without reported diagnosis    long ago per pt   Cataract    removed both eyes   CHF (congestive heart failure) ([place])    Chronic airway obstruction, not elsewhere classified    Complication of anesthesia    slow to wake up   Depressive disorder, not elsewhere classified    Diverticulitis    Dysthymic disorder    Erosive gastritis 08/2004   GERD (gastroesophageal reflux disease)    Hair thinning    HTN (hypertension)    pt denies    Hyperplastic polyps of stomach 06/2002   Hypothyroidism    ICD (implantable cardiac defibrillator) in place    BiV/ICD; s/p removal of ICD with insertion of BiV PPM 04/26/12   Incisional hernia  Insomnia 08/20/2015   Kyphosis 08/20/2015   LBBB (left bundle branch block)    Melanoma (HCC)    right arm   Mitral regurgitation    Nonischemic cardiomyopathy ([place])    EF initially 20%; Last measurement up to 50% per echo in August of 2012   Osteopenia    Other and unspecified hyperlipidemia    Other and unspecified hyperlipidemia    Pacemaker 04/26/2012   PAF (paroxysmal atrial fibrillation) ([place])    11/2016   Pain in joint, lower leg    Palpitations    Pneumonia    Restless legs syndrome (RLS)    Synovial cyst of popliteal space    Unspecified chronic bronchitis (HCC)    Unspecified nasal polyp    Unspecified sinusitis (chronic)    Past Surgical History:  Procedure Laterality Date   ABDOMINAL HYSTERECTOMY  1076   endometriosis   APPENDECTOMY      BI-VENTRICULAR PACEMAKER INSERTION N/A 04/26/2012   Procedure: BI-VENTRICULAR PACEMAKER INSERTION (CRT-P);  Surgeon: Evans Lance, MD;  Location: [facility] CATH LAB;  Service: Cardiovascular;  Laterality: N/A;   BIV PACEMAKER GENERATOR CHANGEOUT N/A 01/26/2019   Procedure: BIV PACEMAKER GENERATOR CHANGEOUT;  Surgeon: Evans Lance, MD;  Location: [place] CV LAB;  Service: Cardiovascular;  Laterality: N/A;   CARDIOVERSION N/A 05/06/2020   Procedure: CARDIOVERSION;  Surgeon: Pixie Casino, MD;  Location: [place];  Service: Cardiovascular;  Laterality: N/A;   CARDIOVERSION N/A 05/19/2020   Procedure: CARDIOVERSION;  Surgeon: Jerline Pain, MD;  Location: [facility] ENDOSCOPY;  Service: Cardiovascular;  Laterality: N/A;   COLONOSCOPY     defibrillator insertion     s/p removal of previously implanted BiV ICD and insertion of a new BiV pacemaker on 04/26/12   excise mole of lip  2001   Dr. Larena Sox   excision of wen  1984   Dr. Parks Ranger   FEMORAL HERNIA REPAIR  12/25/2012   Dr Dalbert Batman   FEMORAL HERNIA REPAIR  01/04/2013   Recurrent - Dr. Nash Mantis HERNIA REPAIR N/A 10/01/2013   Procedure: LAPAROSCOPIC INCISIONAL HERNIA;  Surgeon: Gayland Curry, MD;  Location: [facility];  Service: General;  Laterality: N/A;   INCISIONAL HERNIA REPAIR N/A 09/07/2018   Procedure: LAPAROSCOPIC ASSISTED REPAIR OF INCISIONAL HERNIA;  Surgeon: Greer Pickerel, MD;  Location: [place];  Service: General;  Laterality: N/A;   INGUINAL HERNIA REPAIR Right 09/26/2012   Procedure: RIGHT INGUINAL HERNIA REPAIR WITH MESH;  Surgeon: Odis Hollingshead, MD;  Location: [place];  Service: General;  Laterality: Right;   INGUINAL HERNIA REPAIR Right 12/25/2012   Procedure: explor right groin, small bowel rescection, tissue repair right femoral hernia;  Surgeon: Adin Hector, MD;  Location: [facility];  Service: General;  Laterality: Right;   INSERTION OF MESH Right 09/26/2012   Procedure: INSERTION OF MESH;  Surgeon: Odis Hollingshead, MD;   Location: [place];  Service: General;  Laterality: Right;   INSERTION OF MESH N/A 10/01/2013   Procedure: INSERTION OF MESH;  Surgeon: Gayland Curry, MD;  Location: [facility];  Service: General;  Laterality: N/A;   LAPAROSCOPY N/A 01/04/2013   Procedure: Diagnostic Laparoscopy, exploratory laparotomy with small bowel resection, closure of right femoral hernia repair;  Surgeon: Madilyn Hook, DO;  Location: [facility];  Service: General;  Laterality: N/A;   Left arm surgery     POLYPECTOMY     RIGHT BREAST LUMPECTOMY  1988   Dr. Marylene Buerger   right knee  surgery     arthroscopy: Dr. Shellia Carwin   TONSILLECTOMY     UPPER GASTROINTESTINAL ENDOSCOPY     Social History:   reports that she quit smoking about 26 years ago. Her smoking use included cigarettes. She has a 15.00 pack-year smoking history. She has never used smokeless tobacco. She reports that she does not drink alcohol and does not use drugs.  Family History  Problem Relation Age of Onset   Stroke Father    Heart disease Other        maternal side   Colon cancer Paternal Uncle    Breast cancer Maternal Aunt    Colon cancer Cousin    Diabetes Neg Hx    Colon polyps Neg Hx    Rectal cancer Neg Hx    Stomach cancer Neg Hx     Medications: Patient's Medications  New Prescriptions   No medications on file  Previous Medications   ACETAMINOPHEN (TYLENOL) 500 MG TABLET    Take 1,000 mg by mouth every 8 (eight) hours as needed for moderate pain or mild pain.   AMIODARONE (PACERONE) 200 MG TABLET    Take 1 tablet (200 mg total) by mouth daily.   ASCORBIC ACID (VITAMIN C) 1000 MG TABLET    Take 1,000 mg by mouth at bedtime. Ester C   BIOTIN 5000 MCG TABS    Take 5,000 mcg by mouth daily.   BUPROPION (WELLBUTRIN XL) 150 MG 24 HR TABLET    TAKE 1 TABLET BY MOUTH EVERY DAY   CALCIUM CARBONATE-VITAMIN D (CALCIUM + D PO)    Take 2 tablets by mouth daily. 1000 units Vitamin D 1200 mg calcium   CYANOCOBALAMIN (VITAMIN B 12) 500 MCG TABS    Take  1,000 mcg by mouth daily.    ESTRADIOL (ESTRACE) 0.1 MG/GM VAGINAL CREAM    Place 1 g vaginally 2 (two) times a week.   FEXOFENADINE (ALLEGRA) 180 MG TABLET    Take 180 mg by mouth daily.   FUROSEMIDE (LASIX) 40 MG TABLET    TAKE 1 TABLET BY MOUTH EVERY DAY   LEVOTHYROXINE (SYNTHROID) 100 MCG TABLET    Take 1 tablet (100 mcg total) by mouth daily before breakfast.   LOSARTAN (COZAAR) 25 MG TABLET    TAKE 1 TABLET BY MOUTH EVERY DAY   METOPROLOL SUCCINATE (TOPROL-XL) 25 MG 24 HR TABLET    TAKE 1 TABLET BY MOUTH EVERY DAY   POLYETHYL GLYCOL-PROPYL GLYCOL (SYSTANE) 0.4-0.3 % SOLN    Place 1 drop into both eyes daily as needed (dry eyes).   RABEPRAZOLE (ACIPHEX) 20 MG TABLET    Take one tablet by mouth once daily for stomach   RIVAROXABAN (XARELTO) 15 MG TABS TABLET    TAKE 1 TABLET DAILY WITH   SUPPER (DOSE REDUCED)  Modified Medications   No medications on file  Discontinued Medications   FERREX 150 150 MG CAPSULE    TAKE 1 CAPSULE BY MOUTH EVERY DAY    Physical Exam:  Vitals:   09/23/20 1501  BP: 118/60  Pulse: (!) 55  Temp: (!) 97.5 F (36.4 C)  TempSrc: Temporal  SpO2: 98%  Weight: 126 lb 12.8 oz (57.5 kg)  Height: 5\' 8"  (1.727 m)   Body mass index is 19.28 kg/m. Wt Readings from Last 3 Encounters:  09/23/20 126 lb 12.8 oz (57.5 kg)  09/05/20 126 lb 6.4 oz (57.3 kg)  07/18/20 126 lb (57.2 kg)    Physical Exam Vitals and nursing note reviewed.  Constitutional:      Appearance: Normal appearance.  HENT:     Head: Normocephalic.  Cardiovascular:     Rate and Rhythm: Normal rate and regular rhythm.  Pulmonary:     Effort: Pulmonary effort is normal.     Breath sounds: Normal breath sounds.  Musculoskeletal:     Comments: : Straight leg raising is negative There is some crepitance around the right knee with flexion and extension Deep tendon reflexes are symmetric at the knees  Neurological:     Mental Status: She is alert.    Labs reviewed: Basic Metabolic  Panel: Recent Labs    04/28/20 1635 05/16/20 1819 05/17/20 0259 05/18/20 0459 05/19/20 0241 05/20/20 0227 07/24/20 1534 08/15/20 1549 09/01/20 1532 09/05/20 1622 09/05/20 1651  NA 138   < > 137 139 141 139 137   < > 133* 137 139  K 4.4   < > 3.5 3.5 3.6 3.5 4.6   < > 5.4* 4.6 4.8  CL 106   < > 109 109 104 99 101   < > 99 101 105  CO2 17*   < > 18* 23 27 33* 20   < > 21 19* 20  GLUCOSE 88   < > 100* 97 111* 101* 72   < > 84 84 80  BUN 30*   < > 32* 29* 29* 25* 40*   < > 46* 40* 40*  CREATININE 0.94   < > 0.97 1.08* 1.03* 1.11* 1.49*   < > 1.93* 1.64* 1.57*  CALCIUM 8.9   < > 8.9 8.5* 8.5* 8.9 8.7   < > 9.1 9.1 9.1  MG 2.1  --  1.9 1.8 2.1 2.0  --   --   --   --   --   TSH 2.600  --  2.784  --   --   --  4.130  --   --   --   --    < > = values in this interval not displayed.   Liver Function Tests: Recent Labs    11/22/19 1630 07/24/20 1534  AST 19 14  ALT 11 11  ALKPHOS  --  87  BILITOT 0.4 0.3  PROT 6.3 6.2  ALBUMIN  --  4.1   No results for input(s): LIPASE, AMYLASE in the last 8760 hours. No results for input(s): AMMONIA in the last 8760 hours. CBC: Recent Labs    11/22/19 1630 04/28/20 1635 05/16/20 1819 05/19/20 0241 07/24/20 1534  WBC 5.9   < > 7.4 9.3 6.7  NEUTROABS 3,634  --   --  6.6 4.6  HGB 11.4*   < > 10.8* 11.3* 10.4*  HCT 34.2*   < > 34.7* 34.5* 31.5*  MCV 92.2   < > 93.0 90.1 89  PLT 167   < > 216 213 186   < > = values in this interval not displayed.   Lipid Panel: Recent Labs    11/22/19 1630  CHOL 159  HDL 53  LDLCALC 82  TRIG 137  CHOLHDL 3.0   TSH: Recent Labs    04/28/20 1635 05/17/20 0259 07/24/20 1534  TSH 2.600 2.784 4.130   A1C: No results found for: HGBA1C   Assessment/Plan 1. Hypothyroidism, unspecified type Most recent TSH 2 months ago was 4.1.  Continue same dose of thyroid supplement, 100 mcg daily  2. Generalized anxiety disorder This relates to her depression.  I encouraged her to go ahead and start the  bupropion  3. Moderate episode of recurrent major depressive disorder ([place]) See anxiety above  4. AAA (abdominal aortic aneurysm) without rupture ([place]) Is due to have a repeat ultrasound of abdominal aneurysm  5. Acute on chronic systolic CHF (congestive heart failure) (HCC) Stable at present time on 20 mg of Lasix.  Other cardiotonic drugs include metoprolol and amiodarone and losartan    Alain Honey, MD [place] [phone]

## 2020-10-09 ENCOUNTER — Ambulatory Visit (HOSPITAL_COMMUNITY): Payer: Medicare Other | Attending: Cardiovascular Disease

## 2020-10-09 ENCOUNTER — Other Ambulatory Visit: Payer: Self-pay

## 2020-10-09 DIAGNOSIS — E875 Hyperkalemia: Secondary | ICD-10-CM | POA: Diagnosis not present

## 2020-10-09 DIAGNOSIS — I4819 Other persistent atrial fibrillation: Secondary | ICD-10-CM | POA: Diagnosis not present

## 2020-10-09 DIAGNOSIS — I5022 Chronic systolic (congestive) heart failure: Secondary | ICD-10-CM

## 2020-10-09 DIAGNOSIS — I447 Left bundle-branch block, unspecified: Secondary | ICD-10-CM

## 2020-10-10 LAB — ECHOCARDIOGRAM COMPLETE
Area-P 1/2: 1.77 cm2
MV M vel: 4.91 m/s
MV Peak grad: 96.4 mmHg
P 1/2 time: 659 msec
Radius: 0.7 cm
S' Lateral: 3.3 cm

## 2020-10-20 DIAGNOSIS — Z23 Encounter for immunization: Secondary | ICD-10-CM | POA: Diagnosis not present

## 2020-10-29 DIAGNOSIS — L309 Dermatitis, unspecified: Secondary | ICD-10-CM | POA: Diagnosis not present

## 2020-10-29 DIAGNOSIS — I8391 Asymptomatic varicose veins of right lower extremity: Secondary | ICD-10-CM | POA: Diagnosis not present

## 2020-10-29 DIAGNOSIS — D225 Melanocytic nevi of trunk: Secondary | ICD-10-CM | POA: Diagnosis not present

## 2020-10-29 DIAGNOSIS — L821 Other seborrheic keratosis: Secondary | ICD-10-CM | POA: Diagnosis not present

## 2020-10-29 DIAGNOSIS — Z85820 Personal history of malignant melanoma of skin: Secondary | ICD-10-CM | POA: Diagnosis not present

## 2020-10-29 DIAGNOSIS — D692 Other nonthrombocytopenic purpura: Secondary | ICD-10-CM | POA: Diagnosis not present

## 2020-10-29 DIAGNOSIS — L814 Other melanin hyperpigmentation: Secondary | ICD-10-CM | POA: Diagnosis not present

## 2020-10-30 ENCOUNTER — Ambulatory Visit (INDEPENDENT_AMBULATORY_CARE_PROVIDER_SITE_OTHER): Payer: Medicare Other

## 2020-10-30 ENCOUNTER — Other Ambulatory Visit: Payer: Self-pay

## 2020-10-30 DIAGNOSIS — I428 Other cardiomyopathies: Secondary | ICD-10-CM

## 2020-10-30 LAB — CUP PACEART INCLINIC DEVICE CHECK
Brady Statistic RA Percent Paced: 69 %
Brady Statistic RV Percent Paced: 89 %
Date Time Interrogation Session: 20220825160421
Implantable Lead Implant Date: 20070309
Implantable Lead Implant Date: 20070309
Implantable Lead Implant Date: 20070309
Implantable Lead Location: 753858
Implantable Lead Location: 753859
Implantable Lead Location: 753860
Implantable Lead Model: 158
Implantable Lead Model: 4087
Implantable Lead Model: 4543
Implantable Lead Serial Number: 121652
Implantable Lead Serial Number: 168416
Implantable Lead Serial Number: 268502
Implantable Pulse Generator Implant Date: 20201120
Lead Channel Impedance Value: 398 Ohm
Lead Channel Impedance Value: 450 Ohm
Lead Channel Impedance Value: 487 Ohm
Lead Channel Pacing Threshold Amplitude: 1.7 V
Lead Channel Pacing Threshold Amplitude: 2 V
Lead Channel Pacing Threshold Amplitude: 2.8 V
Lead Channel Pacing Threshold Pulse Width: 0.4 ms
Lead Channel Pacing Threshold Pulse Width: 1 ms
Lead Channel Pacing Threshold Pulse Width: 1 ms
Lead Channel Sensing Intrinsic Amplitude: 1.4 mV
Lead Channel Sensing Intrinsic Amplitude: 12.9 mV
Lead Channel Sensing Intrinsic Amplitude: 5.4 mV
Lead Channel Setting Pacing Amplitude: 2.5 V
Lead Channel Setting Pacing Amplitude: 2.6 V
Lead Channel Setting Pacing Amplitude: 4 V
Lead Channel Setting Pacing Pulse Width: 1 ms
Lead Channel Setting Pacing Pulse Width: 1 ms
Lead Channel Setting Sensing Sensitivity: 2.5 mV
Lead Channel Setting Sensing Sensitivity: 4 mV
Pulse Gen Serial Number: 714246

## 2020-10-30 NOTE — Progress Notes (Signed)
Pacemaker check in clinic. Normal device function. RV/LV Thresholds, sensing, impedances consistent with previous measurements. RA threshold noted to be increasing.  Discussed with Dr. Lovena Le, he advised to program output fixed 2.5@ 0.5m.  Device programmed to maximize longevity. Patient underwent DCCV in March 2022 (after last in-clinic check), she has not had any recurrent AF since that time.  Device programmed at appropriate safety margins. Histogram distribution appropriate for patient activity level. Patient is Bi V pacing 89% of the time which is improved compared to previous reports.  Estimated longevity 4.5years. Patient refuses remote monitoring, due for in-clinic check with Dr. TLovena Lein 11/2020, recall present. Patient education completed.

## 2020-11-13 ENCOUNTER — Ambulatory Visit (INDEPENDENT_AMBULATORY_CARE_PROVIDER_SITE_OTHER): Payer: Medicare Other | Admitting: Vascular Surgery

## 2020-11-13 ENCOUNTER — Encounter: Payer: Self-pay | Admitting: Vascular Surgery

## 2020-11-13 ENCOUNTER — Other Ambulatory Visit: Payer: Self-pay

## 2020-11-13 ENCOUNTER — Ambulatory Visit (HOSPITAL_COMMUNITY)
Admission: RE | Admit: 2020-11-13 | Discharge: 2020-11-13 | Disposition: A | Discharge status: Home / Self Care | Payer: Medicare Other | Source: Ambulatory Visit | Attending: Vascular Surgery | Admitting: Vascular Surgery

## 2020-11-13 VITALS — BP 165/76 | HR 55 | Temp 97.9°F | Resp 20 | Ht 68.0 in | Wt 129.0 lb

## 2020-11-13 DIAGNOSIS — I2510 Atherosclerotic heart disease of native coronary artery without angina pectoris: Secondary | ICD-10-CM | POA: Diagnosis not present

## 2020-11-13 DIAGNOSIS — I739 Peripheral vascular disease, unspecified: Secondary | ICD-10-CM

## 2020-11-13 DIAGNOSIS — I714 Abdominal aortic aneurysm, without rupture, unspecified: Secondary | ICD-10-CM

## 2020-11-13 DIAGNOSIS — I872 Venous insufficiency (chronic) (peripheral): Secondary | ICD-10-CM

## 2020-11-13 DIAGNOSIS — I2584 Coronary atherosclerosis due to calcified coronary lesion: Secondary | ICD-10-CM

## 2020-11-13 NOTE — Progress Notes (Signed)
REASON FOR VISIT:   Follow-up of abdominal aortic aneurysm.  MEDICAL ISSUES:   ABDOMINAL AORTIC ANEURYSM: This patient has a small abdominal aortic aneurysm.  This has been stable in size.  She has a relative whose aneurysm enlarged and required stent graft repair.  She wants [place] to continue to follow this closely.  I think 64-monthfollow-up is reasonable.  I ordered a duplex at that time.  She is not a smoker.  CHRONIC VENOUS INSUFFICIENCY: This patient has CEAP C4a venous disease.  We have again discussed the importance of intermittent leg elevation and the proper positioning for this.  I encouraged her to remain active and avoid prolonged sitting and standing.  She can use mild compression.  PERIPHERAL ARTERIAL DISEASE: She was complaining of some leg pain but I think this is likely related to her back.  She does have palpable femoral pulses.  On the right side she has a palpable popliteal pulse and a biphasic dorsalis pedis signal.  On the left side I cannot palpate a popliteal pulse.  She has monophasic signals in the left foot that are fairly brisk.  We will get baseline ABIs when she returns at her next visit.   HPI:   [name] STARLIPERis a pleasant 87y.o. female who I last saw on 05/09/2019 with a small 2.9 cm infrarenal abdominal aortic aneurysm.  This is been stable in size.  I recommended an 134-monthollow-up visit.  She also had chronic venous insufficiency.  We discussed conservative measures.  Since I saw her last she denies any abdominal pain.  She does have some back pain and this pain radiates down to both legs.  I suspect she has some degenerative disc disease of her back.  I do not get any history of calf claudication or rest pain.  She is very concerned about her venous disease.  But she has had no recent leg swelling.  She quit smoking many years ago.  She did have an issue with congestive heart failure in March of this year.  She is on some new medications which she is  concerned about.  Past Medical History:  Diagnosis Date   AAA (abdominal aortic aneurysm) (HCC)    2.8 cm 05/2018; 5 year [place] ACR guidelines   Allergy    all year    Anxiety disorder    Arthritis    Barrett's esophagus 04/2008   Benign neoplasm of colon    Blood transfusion without reported diagnosis    long ago per pt   Cataract    removed both eyes   CHF (congestive heart failure) ([place]   Chronic airway obstruction, not elsewhere classified    Complication of anesthesia    slow to wake up   Depressive disorder, not elsewhere classified    Diverticulitis    Dysthymic disorder    Erosive gastritis 08/2004   GERD (gastroesophageal reflux disease)    Hair thinning    HTN (hypertension)    pt denies    Hyperplastic polyps of stomach 06/2002   Hypothyroidism    ICD (implantable cardiac defibrillator) in place    BiV/ICD; s/p removal of ICD with insertion of BiV PPM 04/26/12   Incisional hernia    Insomnia 08/20/2015   Kyphosis 08/20/2015   LBBB (left bundle branch block)    Melanoma (HCC)    right arm   Mitral regurgitation    [place]mic [place]   EF initially 20%; Last measurement  up to 50% per echo in August of 2012   Osteopenia    Other and unspecified hyperlipidemia    Other and unspecified hyperlipidemia    Pacemaker 04/26/2012   PAF (paroxysmal atrial fibrillation) ([place])    11/2016   Pain in joint, lower leg    Palpitations    Pneumonia    Restless legs syndrome (RLS)    Synovial cyst of popliteal space    Unspecified chronic bronchitis (HCC)    Unspecified nasal polyp    Unspecified sinusitis (chronic)     Family History  Problem Relation Age of Onset   Stroke Father    Heart disease Other        maternal side   Colon cancer Paternal Uncle    Breast cancer Maternal Aunt    Colon cancer Cousin    Diabetes Neg Hx    Colon polyps Neg Hx    Rectal cancer Neg Hx    Stomach cancer Neg Hx     SOCIAL HISTORY: Social History   Tobacco Use    Smoking status: Former    Packs/day: 1.00    Years: 15.00    Pack years: 15.00    Types: Cigarettes    Quit date: 12/22/1993    Years since quitting: 26.9   Smokeless tobacco: Never   Tobacco comments:    Does'nt reall year quit   Substance Use Topics   Alcohol use: No    Alcohol/week: 0.0 standard drinks    Allergies  Allergen Reactions   Hydrocodone Itching   Cephalexin Itching and Swelling   Ciprofloxacin Other (See Comments)    Not indicated due to aneurysm in aorta     Doxycycline Other (See Comments)    Unknown reaction   Escitalopram Other (See Comments)    Dry mouth   Levofloxacin Other (See Comments)    Not indicated due to aneurysm in aorta    Moxifloxacin Other (See Comments)    Not indicated due to aneurysm in aorta    Ofloxacin Other (See Comments)    Unknown reaction   Sertraline Other (See Comments)    insomnia   Sulfamethoxazole Hives    Any sulfa meds.   Tape Other (See Comments)    Burns skin.    Latex Rash    "If pt. Makes contact with or wearing"   Neomycin Rash    Current Outpatient Medications  Medication Sig Dispense Refill   acetaminophen (TYLENOL) 500 MG tablet Take 1,000 mg by mouth every 8 (eight) hours as needed for moderate pain or mild pain.     amiodarone (PACERONE) 200 MG tablet Take 1 tablet (200 mg total) by mouth daily. 90 tablet 3   Ascorbic Acid (VITAMIN C) 1000 MG tablet Take 1,000 mg by mouth at bedtime. Ester C     Biotin 5000 MCG TABS Take 5,000 mcg by mouth daily.     Calcium Carbonate-Vitamin D (CALCIUM + D PO) Take 2 tablets by mouth daily. 1000 units Vitamin D 1200 mg calcium     Cyanocobalamin (VITAMIN B 12) 500 MCG TABS Take 1,000 mcg by mouth daily.      fexofenadine (ALLEGRA) 180 MG tablet Take 180 mg by mouth daily.     furosemide (LASIX) 40 MG tablet TAKE 1 TABLET BY MOUTH EVERY DAY (Patient taking differently: 20 mg daily.) 90 tablet 3   levothyroxine (SYNTHROID) 100 MCG tablet Take 1 tablet (100 mcg total) by  mouth daily before breakfast. 90 tablet 1   losartan (  COZAAR) 25 MG tablet TAKE 1 TABLET BY MOUTH EVERY DAY 30 tablet 11   metoprolol succinate (TOPROL-XL) 25 MG 24 hr tablet TAKE 1 TABLET BY MOUTH EVERY DAY 30 tablet 11   Polyethyl Glycol-Propyl Glycol (SYSTANE) 0.4-0.3 % SOLN Place 1 drop into both eyes daily as needed (dry eyes).     RABEprazole (ACIPHEX) 20 MG tablet Take one tablet by mouth once daily for stomach 90 tablet 1   Rivaroxaban (XARELTO) 15 MG TABS tablet TAKE 1 TABLET DAILY WITH   SUPPER (DOSE REDUCED) (Patient taking differently: TAKE 1 TABLET DAILY WITH   SUPPER (DOSE REDUCED)) 90 tablet 3   buPROPion (WELLBUTRIN XL) 150 MG 24 hr tablet TAKE 1 TABLET BY MOUTH EVERY DAY (Patient not taking: No sig reported) 90 tablet 1   estradiol (ESTRACE) 0.1 MG/GM vaginal cream Place 1 g vaginally 2 (two) times a week. (Patient not taking: No sig reported)     No current facility-administered medications for this visit.    REVIEW OF SYSTEMS:  '[X]'$  denotes positive finding, '[ ]'$  denotes negative finding Cardiac  Comments:  Chest pain or chest pressure:    Shortness of breath upon exertion: x   Short of breath when lying flat:    Irregular heart rhythm:        Vascular    Pain in calf, thigh, or hip brought on by ambulation:    Pain in feet at night that wakes you up from your sleep:     Blood clot in your veins:    Leg swelling:         Pulmonary    Oxygen at home:    Productive cough:     Wheezing:         Neurologic    Sudden weakness in arms or legs:     Sudden numbness in arms or legs:     Sudden onset of difficulty speaking or slurred speech:    Temporary loss of vision in one eye:     Problems with dizziness:         Gastrointestinal    Blood in stool:     Vomited blood:         Genitourinary    Burning when urinating:     Blood in urine:        Psychiatric    Major depression:         Hematologic    Bleeding problems:    Problems with blood clotting too  easily:        Skin    Rashes or ulcers:        Constitutional    Fever or chills:     PHYSICAL EXAM:   Vitals:   11/13/20 0939  BP: (!) 165/76  Pulse: (!) 55  Resp: 20  Temp: 97.9 F (36.6 C)  SpO2: 96%  Weight: 129 lb (58.5 kg)  Height: '5\' 8"'$  (1.727 m)    GENERAL: The patient is a well-nourished female, in no acute distress. The vital signs are documented above. CARDIAC: There is a regular rate and rhythm.  VASCULAR: I do not detect carotid bruits. On the right side she has a palpable femoral and popliteal pulse with a biphasic dorsalis pedis signal with the Doppler. On the left side she has a palpable femoral pulse.  I cannot palpate a popliteal pulse.  She has monophasic Doppler signals in the left foot which are fairly brisk. She has varicose veins of both lower extremities and hyperpigmentation  bilaterally. PULMONARY: There is good air exchange bilaterally without wheezing or rales. ABDOMEN: Soft and non-tender with normal pitched bowel sounds.  Her aneurysm is palpable and nontender. MUSCULOSKELETAL: There are no major deformities or cyanosis. NEUROLOGIC: No focal weakness or paresthesias are detected. SKIN: There are no ulcers or rashes noted. PSYCHIATRIC: The patient has a normal affect.  DATA:    DUPLEX ABDOMINAL AORTA: I have independently interpreted her duplex of the abdominal aorta.  The maximum diameter of her aorta is 2.9 cm which is not changed compared to her previous study in March 2021.  She does have significant calcific disease in the aorta and common iliac arteries.  She has biphasic flow in the right common iliac artery with a maximum diameter of 1.5 cm.  She has monophasic flow in the left common iliac artery with a maximum diameter of 2.0 cm.  [name] [facility] [phone]

## 2020-11-19 DIAGNOSIS — Z1231 Encounter for screening mammogram for malignant neoplasm of breast: Secondary | ICD-10-CM | POA: Diagnosis not present

## 2020-11-19 LAB — HM MAMMOGRAPHY

## 2020-11-20 ENCOUNTER — Encounter: Payer: Self-pay | Admitting: *Deleted

## 2020-12-09 ENCOUNTER — Other Ambulatory Visit: Payer: Self-pay | Admitting: Family Medicine

## 2020-12-15 ENCOUNTER — Other Ambulatory Visit: Payer: Self-pay | Admitting: Family Medicine

## 2020-12-27 NOTE — Progress Notes (Signed)
[name] Date of Birth: 17-Nov-1932 Medical Record #[id number]  History of Present Illness: [name] is seen today for follow up. She has multiple medical issues which include a cardiomyopathy, underlying BiV/ICD which resulted in improvement of her EF, LBBB, HTN, thyroid disease, melanoma, anxiety/depression and chronic systolic HF.   In September 2018 she was noted in device clinic to have paroxysmal Afib with controlled rate. She was started on Eliquis. [place] Vasc score of at least 4. She developed itching on Eliquis and switched to Xarelto. Itching did not go away so she was switched to Coumadin. After about a month the itching resolved.  She was later switched back to Xarelto without rash. She had change out of her ICD generator in November due to ERI. Her device was downgraded to Biventricular pacing only. Follow up with Dr Lovena Le  In February showed normal device function.  She was seen in the office on 04/28/20 with complaints of increased SOB, edema and fatigue. Was found to be in Afib with rate 125. Patient admitted from the office with acute on chronic CHF felt to be secondary to atrial fibrillation with RVR. BNP elevated at 1,299. Chest x-ray consistent with CHF with mild pulmonary edema and small bilateral pleural effusions. Echo showed LVEF of 25-30% (down from 55-60% in 06/2019) with global hypokinesis and moderate to severe MR. RV mildly enlarged with mildly reduced systolic function, severe TR, and moderately elevated PASP. Also showed small pericardial effusion. Patient was diuresed with IV Lasix. Net negative 8.078 L during admission. Discharge weight 117lb but this may not be accurate (was 130.5lbs on 05/19/2020). Was discharged on Lasix 40mg  daily, Losartan 25mg  daily (rather than home Trandolapril), and Toprol-XL 25mg  daily (rather than home Lopressor). Did not add spironolactone  given soft BP at times. Patient also does not want to be on a lot of new medications. She was  considered for Tikosyn but did not want to start this. She was started on amiodarone.  Drop in EF felt to be secondary to atrial fibrillation with RVR. Given advance age, it was recommended to treat atrial fibrillation and heart failure medically and then reassess EF in 3 months. No ischemic evaluation felt to be necessary this admission.     On her last visit we added aldactone 25 mg daily. This resulted in  increase in creatinine and potassium aldactone was discontinued.   Repeat Echo in August showed improvement in EF to 45-50%. Last device check in August showed no recurrent Afib.   On follow up she complains that she fatigues easily and is off balance. Has fallen a few times. Denies any palpitations or SOB. Complains of pain in her right eye. Weigh is up a little. Mild swelling in legs during the day and they feel "lumpy". Generally depressed as usual.   Current Outpatient Medications  Medication Sig Dispense Refill   acetaminophen (TYLENOL) 500 MG tablet Take 1,000 mg by mouth every 8 (eight) hours as needed for moderate pain or mild pain.     amiodarone (PACERONE) 200 MG tablet Take 1 tablet (200 mg total) by mouth daily. 90 tablet 3   Ascorbic Acid (VITAMIN C) 1000 MG tablet Take 1,000 mg by mouth at bedtime. Ester C     Biotin 5000 MCG TABS Take 5,000 mcg by mouth daily.     Calcium Carbonate-Vitamin D (CALCIUM + D PO) Take 2 tablets by mouth daily. 1000 units Vitamin D 1200 mg calcium     Cyanocobalamin (VITAMIN B 12)  500 MCG TABS Take 1,000 mcg by mouth daily.      empagliflozin (JARDIANCE) 10 MG TABS tablet Take 1 tablet (10 mg total) by mouth daily before breakfast. 30 tablet 11   estradiol (ESTRACE) 0.1 MG/GM vaginal cream Place 1 g vaginally 2 (two) times a week.     fexofenadine (ALLEGRA) 180 MG tablet Take 180 mg by mouth daily.     furosemide (LASIX) 40 MG tablet TAKE 1 TABLET BY MOUTH EVERY DAY (Patient taking differently: 20 mg daily.) 90 tablet 3   losartan (COZAAR) 25 MG  tablet TAKE 1 TABLET BY MOUTH EVERY DAY 30 tablet 11   metoprolol succinate (TOPROL-XL) 25 MG 24 hr tablet TAKE 1 TABLET BY MOUTH EVERY DAY 30 tablet 11   Polyethyl Glycol-Propyl Glycol (SYSTANE) 0.4-0.3 % SOLN Place 1 drop into both eyes daily as needed (dry eyes).     RABEprazole (ACIPHEX) 20 MG tablet Take one tablet by mouth once daily for stomach 90 tablet 1   Rivaroxaban (XARELTO) 15 MG TABS tablet TAKE 1 TABLET DAILY WITH   SUPPER (DOSE REDUCED) (Patient taking differently: TAKE 1 TABLET DAILY WITH   SUPPER (DOSE REDUCED)) 90 tablet 3   SYNTHROID 100 MCG tablet TAKE 1 TABLET BY MOUTH DAILY BEFORE BREAKFAST. 90 tablet 1   No current facility-administered medications for this visit.    Allergies  Allergen Reactions   Hydrocodone Itching   Cephalexin Itching and Swelling   Ciprofloxacin Other (See Comments)    Not indicated due to aneurysm in aorta     Doxycycline Other (See Comments)    Unknown reaction   Escitalopram Other (See Comments)    Dry mouth   Levofloxacin Other (See Comments)    Not indicated due to aneurysm in aorta    Moxifloxacin Other (See Comments)    Not indicated due to aneurysm in aorta    Ofloxacin Other (See Comments)    Unknown reaction   Sertraline Other (See Comments)    insomnia   Sulfamethoxazole Hives    Any sulfa meds.   Tape Other (See Comments)    Burns skin.    Latex Rash    "If pt. Makes contact with or wearing"   Neomycin Rash    Past Medical History:  Diagnosis Date   AAA (abdominal aortic aneurysm)    2.8 cm 05/2018; 5 year [place] per ACR guidelines   Allergy    all year    Anxiety disorder    Arthritis    Barrett's esophagus 04/2008   Benign neoplasm of colon    Blood transfusion without reported diagnosis    long ago per pt   Cataract    removed both eyes   CHF (congestive heart failure) ([place])    Chronic airway obstruction, not elsewhere classified    Complication of anesthesia    slow to wake up   Depressive disorder, not  elsewhere classified    Diverticulitis    Dysthymic disorder    Erosive gastritis 08/2004   GERD (gastroesophageal reflux disease)    Hair thinning    HTN (hypertension)    pt denies    Hyperplastic polyps of stomach 06/2002   Hypothyroidism    ICD (implantable cardiac defibrillator) in place    BiV/ICD; s/p removal of ICD with insertion of BiV PPM 04/26/12   Incisional hernia    Insomnia 08/20/2015   Kyphosis 08/20/2015   LBBB (left bundle branch block)    Melanoma ([place])    right arm  Mitral regurgitation    Nonischemic cardiomyopathy (H[facility])    EF initially 20%; Last measurement up to 50% per echo in August of 2012   Osteopenia    Other and unspecified hyperlipidemia    Other and unspecified hyperlipidemia    Pacemaker 04/26/2012   PAF (paroxysmal atrial fibrillation) ([place])    11/2016   Pain in joint, lower leg    Palpitations    Pneumonia    Restless legs syndrome (RLS)    Synovial cyst of popliteal space    Unspecified chronic bronchitis (HCC)    Unspecified nasal polyp    Unspecified sinusitis (chronic)     Past Surgical History:  Procedure Laterality Date   ABDOMINAL HYSTERECTOMY  1076   endometriosis   APPENDECTOMY     BI-VENTRICULAR PACEMAKER INSERTION N/A 04/26/2012   Procedure: BI-VENTRICULAR PACEMAKER INSERTION (CRT-P);  Surgeon: Evans Lance, MD;  Location: [facility] CATH LAB;  Service: Cardiovascular;  Laterality: N/A;   BIV PACEMAKER GENERATOR CHANGEOUT N/A 01/26/2019   Procedure: BIV PACEMAKER GENERATOR CHANGEOUT;  Surgeon: Evans Lance, MD;  Location: [place] CV LAB;  Service: Cardiovascular;  Laterality: N/A;   CARDIOVERSION N/A 05/06/2020   Procedure: CARDIOVERSION;  Surgeon: Pixie Casino, MD;  Location: [place];  Service: Cardiovascular;  Laterality: N/A;   CARDIOVERSION N/A 05/19/2020   Procedure: CARDIOVERSION;  Surgeon: Jerline Pain, MD;  Location: [facility] ENDOSCOPY;  Service: Cardiovascular;  Laterality: N/A;   COLONOSCOPY     defibrillator  insertion     s/p removal of previously implanted BiV ICD and insertion of a new BiV pacemaker on 04/26/12   excise mole of lip  2001   Dr. Larena Sox   excision of wen  1984   Dr. Parks Ranger   FEMORAL HERNIA REPAIR  12/25/2012   Dr Dalbert Batman   FEMORAL HERNIA REPAIR  01/04/2013   Recurrent - Dr. Nash Mantis HERNIA REPAIR N/A 10/01/2013   Procedure: LAPAROSCOPIC INCISIONAL HERNIA;  Surgeon: Gayland Curry, MD;  Location: [facility];  Service: General;  Laterality: N/A;   INCISIONAL HERNIA REPAIR N/A 09/07/2018   Procedure: LAPAROSCOPIC ASSISTED REPAIR OF INCISIONAL HERNIA;  Surgeon: Greer Pickerel, MD;  Location: [place];  Service: General;  Laterality: N/A;   INGUINAL HERNIA REPAIR Right 09/26/2012   Procedure: RIGHT INGUINAL HERNIA REPAIR WITH MESH;  Surgeon: Odis Hollingshead, MD;  Location: [place];  Service: General;  Laterality: Right;   INGUINAL HERNIA REPAIR Right 12/25/2012   Procedure: explor right groin, small bowel rescection, tissue repair right femoral hernia;  Surgeon: Adin Hector, MD;  Location: [facility];  Service: General;  Laterality: Right;   INSERTION OF MESH Right 09/26/2012   Procedure: INSERTION OF MESH;  Surgeon: Odis Hollingshead, MD;  Location: [place];  Service: General;  Laterality: Right;   INSERTION OF MESH N/A 10/01/2013   Procedure: INSERTION OF MESH;  Surgeon: Gayland Curry, MD;  Location: [facility];  Service: General;  Laterality: N/A;   LAPAROSCOPY N/A 01/04/2013   Procedure: Diagnostic Laparoscopy, exploratory laparotomy with small bowel resection, closure of right femoral hernia repair;  Surgeon: Madilyn Hook, DO;  Location: [facility];  Service: General;  Laterality: N/A;   Left arm surgery     POLYPECTOMY     RIGHT BREAST LUMPECTOMY  1988   Dr. Marylene Buerger   right knee surgery     arthroscopy: Dr. Shellia Carwin   TONSILLECTOMY     UPPER GASTROINTESTINAL ENDOSCOPY      Social History  Tobacco Use  Smoking Status Former   Packs/day: 1.00   Years: 15.00   Pack years:  15.00   Types: Cigarettes   Quit date: 12/22/1993   Years since quitting: 27.0  Smokeless Tobacco Never  Tobacco Comments   Does'nt reall year quit     Social History   Substance and Sexual Activity  Alcohol Use No   Alcohol/week: 0.0 standard drinks    Family History  Problem Relation Age of Onset   Stroke Father    Heart disease Other        maternal side   Colon cancer Paternal Uncle    Breast cancer Maternal Aunt    Colon cancer Cousin    Diabetes Neg Hx    Colon polyps Neg Hx    Rectal cancer Neg Hx    Stomach cancer Neg Hx     Review of Systems: The review of systems is per the HPI.   All other systems were reviewed and are negative.  Physical Exam: BP (!) 158/81 (BP Location: Right Arm, Patient Position: Sitting, Cuff Size: Normal)   Pulse (!) 48   Ht 5\' 8"  (1.727 m)   Wt 129 lb 6.4 oz (58.7 kg)   SpO2 99%   BMI 19.68 kg/m  GENERAL:  thin, elderly thin WF in NAD HEENT:  PERRL, EOMI, sclera are clear. Oropharynx is clear. NECK:  No JVD, carotid upstroke brisk and symmetric, no bruits, no thyromegaly or adenopathy LUNGS:  Clear to auscultation bilaterally CHEST:  Unremarkable HEART:  RRR,  PMI not displaced or sustained,S1 and S2 within normal limits, no S3, no S4: no clicks, no rubs, no murmurs ABD:  Soft, nontender. BS +, no masses or bruits. No hepatomegaly, no splenomegaly EXT:  2 + pulses throughout, no edema, varicosities.  SKIN:  Warm and dry.  No rashes NEURO:  Alert and oriented x 3. Cranial nerves II through XII intact. PSYCH:  Cognitively intact   Wt Readings from Last 3 Encounters:  01/01/21 129 lb 6.4 oz (58.7 kg)  11/13/20 129 lb (58.5 kg)  09/23/20 126 lb 12.8 oz (57.5 kg)     LABORATORY DATA: Lab Results  Component Value Date   WBC 6.7 07/24/2020   HGB 10.4 (L) 07/24/2020   HCT 31.5 (L) 07/24/2020   PLT 186 07/24/2020   GLUCOSE 80 09/05/2020   CHOL 159 11/22/2019   TRIG 137 11/22/2019   HDL 53 11/22/2019   LDLCALC 82  11/22/2019   ALT 11 07/24/2020   AST 14 07/24/2020   NA 139 09/05/2020   K 4.8 09/05/2020   CL 105 09/05/2020   CREATININE 1.57 (H) 09/05/2020   BUN 40 (H) 09/05/2020   CO2 20 09/05/2020   TSH 4.130 07/24/2020   INR 1.7 (H) 05/18/2020     Echo: 07/22/16: Study Conclusions   - Left ventricle: The cavity size was mildly dilated. Wall   thickness was normal. Systolic function was normal. The estimated   ejection fraction was in the range of 50% to 55%. Wall motion was   normal; there were no regional wall motion abnormalities. - Aortic valve: There was trivial regurgitation. - Mitral valve: There was mild regurgitation. - Atrial septum: No defect or patent foramen ovale was identified.   Echo 06/29/19: IMPRESSIONS     1. Left ventricular ejection fraction, by estimation, is 55 to 60%. The  left ventricle has normal function. The left ventricle has no regional  wall motion abnormalities. Left ventricular diastolic parameters are  consistent with Grade II diastolic  dysfunction (pseudonormalization). Elevated left atrial pressure.   2. Right ventricular systolic function is normal. The right ventricular  size is normal. There is moderately elevated pulmonary artery systolic  pressure.   3. Left atrial size was moderately dilated.   4. The mitral valve is normal in structure. Mild to moderate mitral valve  regurgitation.   5. Tricuspid valve regurgitation is mild to moderate.   6. The aortic valve is tricuspid. Aortic valve regurgitation is mild. No  aortic stenosis is present.   7. Aortic dilatation noted. There is borderline dilatation of the  ascending aorta measuring 37 mm.   8. The inferior vena cava is dilated in size with >50% respiratory  variability, suggesting right atrial pressure of 8 mmHg.    Echocardiogram 05/17/2020: Impressions:  1. Left ventricular ejection fraction, by estimation, is 25 to 30%. The  left ventricle has severely decreased function. The left  ventricle  demonstrates global hypokinesis. Left ventricular diastolic function could  not be evaluated.   2. Right ventricular systolic function is mildly reduced. The right  ventricular size is mildly enlarged. There is moderately elevated  pulmonary artery systolic pressure. The estimated right ventricular  systolic pressure is 35.5 mmHg.   3. Left atrial size was severely dilated.   4. Right atrial size was mildly dilated.   5. A small pericardial effusion is present. The pericardial effusion is  circumferential.   6. The mitral valve is grossly normal. Moderate to severe mitral valve  regurgitation. No evidence of mitral stenosis.   7. Tricuspid valve regurgitation is severe.   8. The aortic valve is tricuspid. Aortic valve regurgitation is not  visualized. No aortic stenosis is present.   9. The inferior vena cava is dilated in size with <50% respiratory  variability, suggesting right atrial pressure of 15 mmHg.   Comparison(s): Changes from prior study are noted. EF now severely  reduced. Moderately dilated RV. Moderate to severe MR. Severe TR. RVSP ~49  mmHG.   Echo 10/09/20: IMPRESSIONS     1. Significant improvement in EF since echo done 05/17/20. Left  ventricular ejection fraction, by estimation, is 45 to 50%. The left  ventricle has mildly decreased function. The left ventricle demonstrates  global hypokinesis. The left ventricular  internal cavity size was mildly dilated. Left ventricular diastolic  parameters were normal.   2. ICD wires in RA/RV. Right ventricular systolic function is normal. The  right ventricular size is normal. There is mildly elevated pulmonary  artery systolic pressure.   3. Left atrial size was moderately dilated.   4. The pericardial effusion is posterior to the left ventricle.   5. ? MR from restricted posterior leaflet motion . The mitral valve is  abnormal. Moderate mitral valve regurgitation. No evidence of mitral  stenosis.   6.  Tricuspid valve regurgitation is moderate.   7. The aortic valve is tricuspid. Aortic valve regurgitation is trivial.  No aortic stenosis is present.   8. The inferior vena cava is normal in size with greater than 50%  respiratory variability, suggesting right atrial pressure of 3 mmHg.   Assessment / Plan: 1.  History of dialted Cardiomyopathy with chronic systolic CHF.  EF  normal in April 2021. Has BiV pacemaker in place. Significant worsening of CHF in Feb. EF down to 25-30%.  Suspect this is mostly related to her Afib with RVR. Clinically improved.  Weight is stable  Swelling is minimal today. Maintaining NSR on exam.  Repeat Echo in August showed improved EF to 45-50%. Will continue Lasix, losartan and Toprol. Add Jardiance 10 mg daily. Encouraged her to wear support hose.  2. NSVT. Follow up ICD/biV in device clinic. S/p generator change out in November 2020. Normal device function.  3. Paroxysmal Afib. persistent and ERAD post cardioversion on March 1. RVR. Repeat DCCV post amiodarone. Now in NSR. [place] Vasc score of at least 4. On Xarelto. Needs to stay on amiodarone - will reduce dose slightly to 200 mg 5 days a week and 100 mg 2 days a week. Will check LFTs and TSH today  4. Small AAA and left iliac aneurysm. Follow up with [facility]. Last doppler showed AAA was 2.9 cm.     5. Venous varicosities. Encourage sodium restriction and support hose.   6. Imbalance. Possibly exacerbated by amiodarone. Will reduce dose as noted. Other options for treating Afib limited. I have encouraged her to use a cane with walking.    Follow up in 3 months.

## 2020-12-30 ENCOUNTER — Ambulatory Visit: Payer: Medicare Other | Admitting: Family Medicine

## 2021-01-01 ENCOUNTER — Other Ambulatory Visit: Payer: Self-pay

## 2021-01-01 ENCOUNTER — Ambulatory Visit (INDEPENDENT_AMBULATORY_CARE_PROVIDER_SITE_OTHER): Payer: Medicare Other | Admitting: Cardiology

## 2021-01-01 ENCOUNTER — Encounter: Payer: Self-pay | Admitting: Cardiology

## 2021-01-01 VITALS — BP 158/81 | HR 48 | Ht 68.0 in | Wt 129.4 lb

## 2021-01-01 DIAGNOSIS — I2584 Coronary atherosclerosis due to calcified coronary lesion: Secondary | ICD-10-CM

## 2021-01-01 DIAGNOSIS — E440 Moderate protein-calorie malnutrition: Secondary | ICD-10-CM

## 2021-01-01 DIAGNOSIS — I4819 Other persistent atrial fibrillation: Secondary | ICD-10-CM

## 2021-01-01 DIAGNOSIS — I5022 Chronic systolic (congestive) heart failure: Secondary | ICD-10-CM

## 2021-01-01 DIAGNOSIS — I428 Other cardiomyopathies: Secondary | ICD-10-CM | POA: Diagnosis not present

## 2021-01-01 DIAGNOSIS — I2510 Atherosclerotic heart disease of native coronary artery without angina pectoris: Secondary | ICD-10-CM

## 2021-01-01 DIAGNOSIS — L97322 Non-pressure chronic ulcer of left ankle with fat layer exposed: Secondary | ICD-10-CM | POA: Diagnosis not present

## 2021-01-01 DIAGNOSIS — J418 Mixed simple and mucopurulent chronic bronchitis: Secondary | ICD-10-CM

## 2021-01-01 MED ORDER — EMPAGLIFLOZIN 10 MG PO TABS
10.0000 mg | ORAL_TABLET | Freq: Every day | ORAL | 11 refills | Status: DC
Start: 2021-01-01 — End: 2021-03-23

## 2021-01-01 NOTE — Patient Instructions (Signed)
Reduce amiodarone to 200 mg 5 days a week and 100 mg two days a week.  We will check blood work today  Start Jardiance 10 mg daily for heart failure  You should use a cane when you walk.   Follow up in 3 months

## 2021-01-02 LAB — COMPREHENSIVE METABOLIC PANEL
ALT: 12 IU/L (ref 0–32)
AST: 21 IU/L (ref 0–40)
Albumin/Globulin Ratio: 2.4 — ABNORMAL HIGH (ref 1.2–2.2)
Albumin: 4.6 g/dL (ref 3.6–4.6)
Alkaline Phosphatase: 86 IU/L (ref 44–121)
BUN/Creatinine Ratio: 25 (ref 12–28)
BUN: 31 mg/dL — ABNORMAL HIGH (ref 8–27)
Bilirubin Total: 0.4 mg/dL (ref 0.0–1.2)
CO2: 21 mmol/L (ref 20–29)
Calcium: 9.4 mg/dL (ref 8.7–10.3)
Chloride: 101 mmol/L (ref 96–106)
Creatinine, Ser: 1.25 mg/dL — ABNORMAL HIGH (ref 0.57–1.00)
Globulin, Total: 1.9 g/dL (ref 1.5–4.5)
Glucose: 81 mg/dL (ref 70–99)
Potassium: 4.9 mmol/L (ref 3.5–5.2)
Sodium: 138 mmol/L (ref 134–144)
Total Protein: 6.5 g/dL (ref 6.0–8.5)
eGFR: 42 mL/min/{1.73_m2} — ABNORMAL LOW (ref >59)

## 2021-01-02 LAB — CBC WITH DIFFERENTIAL/PLATELET
Basophils Absolute: 0 10*3/uL (ref 0.0–0.2)
Basos: 0 %
EOS (ABSOLUTE): 0.2 10*3/uL (ref 0.0–0.4)
Eos: 3 %
Hematocrit: 35.6 % (ref 34.0–46.6)
Hemoglobin: 11.8 g/dL (ref 11.1–15.9)
Immature Grans (Abs): 0 10*3/uL (ref 0.0–0.1)
Immature Granulocytes: 0 %
Lymphocytes Absolute: 0.7 10*3/uL (ref 0.7–3.1)
Lymphs: 11 %
MCH: 30.6 pg (ref 26.6–33.0)
MCHC: 33.1 g/dL (ref 31.5–35.7)
MCV: 92 fL (ref 79–97)
Monocytes Absolute: 0.8 10*3/uL (ref 0.1–0.9)
Monocytes: 12 %
Neutrophils Absolute: 4.8 10*3/uL (ref 1.4–7.0)
Neutrophils: 74 %
Platelets: 205 10*3/uL (ref 150–450)
RBC: 3.86 x10E6/uL (ref 3.77–5.28)
RDW: 13.1 % (ref 11.7–15.4)
WBC: 6.6 10*3/uL (ref 3.4–10.8)

## 2021-01-02 LAB — TSH: TSH: 4.33 u[IU]/mL (ref 0.450–4.500)

## 2021-01-13 ENCOUNTER — Encounter: Payer: Self-pay | Admitting: Family Medicine

## 2021-01-13 ENCOUNTER — Ambulatory Visit (INDEPENDENT_AMBULATORY_CARE_PROVIDER_SITE_OTHER): Payer: Medicare Other | Admitting: Family Medicine

## 2021-01-13 ENCOUNTER — Other Ambulatory Visit: Payer: Self-pay

## 2021-01-13 VITALS — BP 138/80 | HR 55 | Temp 97.9°F | Ht 66.08 in | Wt 129.0 lb

## 2021-01-13 DIAGNOSIS — I2510 Atherosclerotic heart disease of native coronary artery without angina pectoris: Secondary | ICD-10-CM

## 2021-01-13 DIAGNOSIS — I2584 Coronary atherosclerosis due to calcified coronary lesion: Secondary | ICD-10-CM

## 2021-01-13 DIAGNOSIS — I5023 Acute on chronic systolic (congestive) heart failure: Secondary | ICD-10-CM

## 2021-01-13 DIAGNOSIS — G8929 Other chronic pain: Secondary | ICD-10-CM | POA: Diagnosis not present

## 2021-01-13 DIAGNOSIS — F331 Major depressive disorder, recurrent, moderate: Secondary | ICD-10-CM | POA: Diagnosis not present

## 2021-01-13 DIAGNOSIS — F411 Generalized anxiety disorder: Secondary | ICD-10-CM | POA: Diagnosis not present

## 2021-01-13 DIAGNOSIS — M545 Low back pain, unspecified: Secondary | ICD-10-CM

## 2021-01-13 DIAGNOSIS — K219 Gastro-esophageal reflux disease without esophagitis: Secondary | ICD-10-CM | POA: Diagnosis not present

## 2021-01-13 MED ORDER — RABEPRAZOLE SODIUM 20 MG PO TBEC: DELAYED_RELEASE_TABLET | ORAL | 1 refills | Status: DC

## 2021-01-13 MED ORDER — ESCITALOPRAM OXALATE 5 MG PO TABS: 5.0000 mg | ORAL_TABLET | Freq: Every day | ORAL | 1 refills | Status: DC

## 2021-01-13 NOTE — Progress Notes (Signed)
Provider:  Alain Honey, MD  Careteam: Patient Care Team: Wardell Honour, MD as PCP - General (Family Medicine) [name], Peter M, MD as PCP - Cardiology (Cardiology) [name], Peter M, MD as Consulting Physician (Cardiology) Milus Banister, MD as Attending Physician (Gastroenterology)  PLACE OF SERVICE:  [place] Directive information Does Patient Have a Medical Advance Directive?: Yes, Type of Advance Directive: [place];Living will, Does patient want to make changes to medical advance directive?: No - Patient declined  Allergies  Allergen Reactions   Hydrocodone Itching   Cephalexin Itching and Swelling   Ciprofloxacin Other (See Comments)    Not indicated due to aneurysm in aorta     Doxycycline Other (See Comments)    Unknown reaction   Escitalopram Other (See Comments)    Dry mouth   Levofloxacin Other (See Comments)    Not indicated due to aneurysm in aorta    Moxifloxacin Other (See Comments)    Not indicated due to aneurysm in aorta    Ofloxacin Other (See Comments)    Unknown reaction   Sertraline Other (See Comments)    insomnia   Sulfamethoxazole Hives    Any sulfa meds.   Tape Other (See Comments)    Burns skin.    Latex Rash    "If pt. Makes contact with or wearing"   Neomycin Rash    Chief Complaint  Patient presents with   Medical Management of Chronic Issues    3-4 month follow-up. Moderate fall risk, patient thinks some medications put her at risk for falling. Discuss need for shingrix, pneumonia, and covid vaccines. Flu vaccine today if ok per Dr.Mackinzee Roszak, neomycin contraindication.      HPI: Patient is a 87 y.o. female .  Says she has no reason or purpose in living.  She has multiple symptoms.  She says that she cannot walk due to pain in her legs and points to a side effect of amiodarone as a potential cause.  I suggested that she  Speak with her cardiologist about need for amiodarone since she already takes  beta blocker and anticoagulant. She worries about falls although dismisses use of walking aid and continues to move very little, which worsens her weakness. We spent time talking about trial of antidepressant which former MD here tried to give her. She tells me she did take lexapro and only side effect was dry mouth.  Review of Systems:  Review of Systems  Constitutional:  Positive for malaise/fatigue.  Respiratory: Negative.    Cardiovascular:  Positive for leg swelling.  Gastrointestinal:  Positive for heartburn.  Genitourinary: Negative.   Neurological:  Positive for weakness.  Psychiatric/Behavioral:  Positive for depression.   All other systems reviewed and are negative.  Past Medical History:  Diagnosis Date   AAA (abdominal aortic aneurysm)    2.8 cm 05/2018; 5 year [place] per ACR guidelines   Allergy    all year    Anxiety disorder    Arthritis    Barrett's esophagus 04/2008   Benign neoplasm of colon    Blood transfusion without reported diagnosis    long ago per pt   Cataract    removed both eyes   CHF (congestive heart failure) ([place])    Chronic airway obstruction, not elsewhere classified    Complication of anesthesia    slow to wake up   Depressive disorder, not elsewhere classified    Diverticulitis    Dysthymic disorder  Erosive gastritis 08/2004   GERD (gastroesophageal reflux disease)    Hair thinning    HTN (hypertension)    pt denies    Hyperplastic polyps of stomach 06/2002   Hypothyroidism    ICD (implantable cardiac defibrillator) in place    BiV/ICD; s/p removal of ICD with insertion of BiV PPM 04/26/12   Incisional hernia    Insomnia 08/20/2015   Kyphosis 08/20/2015   LBBB (left bundle branch block)    Melanoma (HCC)    right arm   Mitral regurgitation    Nonischemic cardiomyopathy ([place])    EF initially 20%; Last measurement up to 50% per echo in August of 2012   Osteopenia    Other and unspecified hyperlipidemia    Other and unspecified  hyperlipidemia    Pacemaker 04/26/2012   PAF (paroxysmal atrial fibrillation) ([place])    11/2016   Pain in joint, lower leg    Palpitations    Pneumonia    Restless legs syndrome (RLS)    Synovial cyst of popliteal space    Unspecified chronic bronchitis (HCC)    Unspecified nasal polyp    Unspecified sinusitis (chronic)    Past Surgical History:  Procedure Laterality Date   ABDOMINAL HYSTERECTOMY  1076   endometriosis   APPENDECTOMY     BI-VENTRICULAR PACEMAKER INSERTION N/A 04/26/2012   Procedure: BI-VENTRICULAR PACEMAKER INSERTION (CRT-P);  Surgeon: Evans Lance, MD;  [facility];  Service: Cardiovascular;  Laterality: N/A;   BIV PACEMAKER GENERATOR CHANGEOUT N/A 01/26/2019   Procedure: BIV PACEMAKER GENERATOR CHANGEOUT;  Surgeon: Evans Lance, MD;  Location: [facility] CV LAB;  Service: Cardiovascular;  Laterality: N/A;   CARDIOVERSION N/A 05/06/2020   Procedure: CARDIOVERSION;  Surgeon: Pixie Casino, MD;  Location: [place];  Service: Cardiovascular;  Laterality: N/A;   CARDIOVERSION N/A 05/19/2020   Procedure: CARDIOVERSION;  Surgeon: Jerline Pain, MD;  Location: [facility] ENDOSCOPY;  Service: Cardiovascular;  Laterality: N/A;   COLONOSCOPY     defibrillator insertion     s/p removal of previously implanted BiV ICD and insertion of a new BiV pacemaker on 04/26/12   excise mole of lip  2001   Dr. Larena Sox   excision of wen  1984   Dr. Parks Ranger   FEMORAL HERNIA REPAIR  12/25/2012   Dr Dalbert Batman   FEMORAL HERNIA REPAIR  01/04/2013   Recurrent - Dr. Nash Mantis HERNIA REPAIR N/A 10/01/2013   Procedure: LAPAROSCOPIC INCISIONAL HERNIA;  Surgeon: Gayland Curry, MD;  Location: [facility];  Service: General;  Laterality: N/A;   INCISIONAL HERNIA REPAIR N/A 09/07/2018   Procedure: LAPAROSCOPIC ASSISTED REPAIR OF INCISIONAL HERNIA;  Surgeon: Greer Pickerel, MD;  Location: [place];  Service: General;  Laterality: N/A;   INGUINAL HERNIA REPAIR Right 09/26/2012   Procedure:  RIGHT INGUINAL HERNIA REPAIR WITH MESH;  Surgeon: Odis Hollingshead, MD;  Location: [place];  Service: General;  Laterality: Right;   INGUINAL HERNIA REPAIR Right 12/25/2012   Procedure: explor right groin, small bowel rescection, tissue repair right femoral hernia;  Surgeon: Adin Hector, MD;  Location: [facility];  Service: General;  Laterality: Right;   INSERTION OF MESH Right 09/26/2012   Procedure: INSERTION OF MESH;  Surgeon: Odis Hollingshead, MD;  Location: [place];  Service: General;  Laterality: Right;   INSERTION OF MESH N/A 10/01/2013   Procedure: INSERTION OF MESH;  Surgeon: Gayland Curry, MD;  Location: [facility];  Service: General;  Laterality: N/A;  LAPAROSCOPY N/A 01/04/2013   Procedure: Diagnostic Laparoscopy, exploratory laparotomy with small bowel resection, closure of right femoral hernia repair;  Surgeon: Madilyn Hook, DO;  Location: [facility];  Service: General;  Laterality: N/A;   Left arm surgery     POLYPECTOMY     RIGHT BREAST LUMPECTOMY  1988   Dr. Marylene Buerger   right knee surgery     arthroscopy: Dr. Shellia Carwin   TONSILLECTOMY     UPPER GASTROINTESTINAL ENDOSCOPY     Social History:   reports that she quit smoking about 27 years ago. Her smoking use included cigarettes. She has a 15.00 pack-year smoking history. She has never used smokeless tobacco. She reports that she does not drink alcohol and does not use drugs.  Family History  Problem Relation Age of Onset   Stroke Father    Heart disease Other        maternal side   Colon cancer Paternal Uncle    Breast cancer Maternal Aunt    Colon cancer Cousin    Diabetes Neg Hx    Colon polyps Neg Hx    Rectal cancer Neg Hx    Stomach cancer Neg Hx     Medications: Patient's Medications  New Prescriptions   No medications on file  Previous Medications   ACETAMINOPHEN (TYLENOL) 500 MG TABLET    Take 1,000 mg by mouth every 8 (eight) hours as needed for moderate pain or mild pain.   AMIODARONE (PACERONE) 200 MG  TABLET    Take 1 tablet (200 mg total) by mouth daily.   ASCORBIC ACID (VITAMIN C) 1000 MG TABLET    Take 1,000 mg by mouth at bedtime. Ester C   BIOTIN 5000 MCG TABS    Take 5,000 mcg by mouth daily.   CALCIUM CARBONATE-VITAMIN D (CALCIUM + D PO)    Take 2 tablets by mouth daily. 1000 units Vitamin D 1200 mg calcium   CYANOCOBALAMIN (VITAMIN B 12) 500 MCG TABS    Take 1,000 mcg by mouth daily.    EMPAGLIFLOZIN (JARDIANCE) 10 MG TABS TABLET    Take 1 tablet (10 mg total) by mouth daily before breakfast.   ESTRADIOL (ESTRACE) 0.1 MG/GM VAGINAL CREAM    Place 1 g vaginally 2 (two) times a week.   FEXOFENADINE (ALLEGRA) 180 MG TABLET    Take 180 mg by mouth daily.   FUROSEMIDE (LASIX) 40 MG TABLET    TAKE 1 TABLET BY MOUTH EVERY DAY   LOSARTAN (COZAAR) 25 MG TABLET    TAKE 1 TABLET BY MOUTH EVERY DAY   METOPROLOL SUCCINATE (TOPROL-XL) 25 MG 24 HR TABLET    TAKE 1 TABLET BY MOUTH EVERY DAY   POLYETHYL GLYCOL-PROPYL GLYCOL (SYSTANE) 0.4-0.3 % SOLN    Place 1 drop into both eyes daily as needed (dry eyes).   RABEPRAZOLE (ACIPHEX) 20 MG TABLET    Take one tablet by mouth once daily for stomach   RIVAROXABAN (XARELTO) 15 MG TABS TABLET    TAKE 1 TABLET DAILY WITH   SUPPER (DOSE REDUCED)   SYNTHROID 100 MCG TABLET    TAKE 1 TABLET BY MOUTH DAILY BEFORE BREAKFAST.  Modified Medications   No medications on file  Discontinued Medications   No medications on file    Physical Exam:  Vitals:   01/13/21 1507  Pulse: (!) 55  Temp: 97.9 F (36.6 C)  TempSrc: Temporal  SpO2: 99%  Weight: 129 lb (58.5 kg)  Height: 5' 6.08" (1.678 m)  Body mass index is 20.77 kg/m. Wt Readings from Last 3 Encounters:  01/13/21 129 lb (58.5 kg)  01/01/21 129 lb 6.4 oz (58.7 kg)  11/13/20 129 lb (58.5 kg)    Physical Exam Vitals and nursing note reviewed.  Constitutional:      Appearance: Normal appearance.  Cardiovascular:     Rate and Rhythm: Normal rate. Rhythm irregular.  Pulmonary:     Effort:  Pulmonary effort is normal.     Breath sounds: Normal breath sounds.  Musculoskeletal:     Right lower leg: Edema present.     Left lower leg: Edema present.     Comments: Trace edema with varicosities. Improved on diuretic by history but spends time in bathroom  Neurological:     General: No focal deficit present.     Mental Status: She is alert and oriented to person, place, and time.    Labs reviewed: Basic Metabolic Panel: Recent Labs    05/17/20 0259 05/18/20 0459 05/19/20 0241 05/20/20 0227 07/24/20 1534 08/15/20 1549 09/05/20 1622 09/05/20 1651 01/01/21 1429  NA 137 139 141 139 137   < > 137 139 138  K 3.5 3.5 3.6 3.5 4.6   < > 4.6 4.8 4.9  CL 109 109 104 99 101   < > 101 105 101  CO2 18* 23 27 33* 20   < > 19* 20 21  GLUCOSE 100* 97 111* 101* 72   < > 84 80 81  BUN 32* 29* 29* 25* 40*   < > 40* 40* 31*  CREATININE 0.97 1.08* 1.03* 1.11* 1.49*   < > 1.64* 1.57* 1.25*  CALCIUM 8.9 8.5* 8.5* 8.9 8.7   < > 9.1 9.1 9.4  MG 1.9 1.8 2.1 2.0  --   --   --   --   --   TSH 2.784  --   --   --  4.130  --   --   --  4.330   < > = values in this interval not displayed.   Liver Function Tests: Recent Labs    07/24/20 1534 01/01/21 1429  AST 14 21  ALT 11 12  ALKPHOS 87 86  BILITOT 0.3 0.4  PROT 6.2 6.5  ALBUMIN 4.1 4.6   No results for input(s): LIPASE, AMYLASE in the last 8760 hours. No results for input(s): AMMONIA in the last 8760 hours. CBC: Recent Labs    05/19/20 0241 07/24/20 1534 01/01/21 1429  WBC 9.3 6.7 6.6  NEUTROABS 6.6 4.6 4.8  HGB 11.3* 10.4* 11.8  HCT 34.5* 31.5* 35.6  MCV 90.1 89 92  PLT 213 186 205   Lipid Panel: No results for input(s): CHOL, HDL, LDLCALC, TRIG, CHOLHDL, LDLDIRECT in the last 8760 hours. TSH: Recent Labs    05/17/20 0259 07/24/20 1534 01/01/21 1429  TSH 2.784 4.130 4.330   A1C: No results found for: HGBA1C   Assessment/Plan  1. Generalized anxiety disorder This is chosen from her prior use - escitalopram  (LEXAPRO) 5 MG tablet; Take 1 tablet (5 mg total) by mouth daily.  Dispense: 90 tablet; Refill: 1  2. Gastroesophageal reflux disease without esophagitis  - RABEprazole (ACIPHEX) 20 MG tablet; Take one tablet by mouth once daily for stomach  Dispense: 90 tablet; Refill: 1  3. Chronic midline low back pain without sciatica Pt complains that legs hurt because toilet seat causes legs to hurt and wants a higher seat - Elevated toilet seat  4. Acute on chronic systolic CHF (  congestive heart failure) ([place]) Appears compensated on current regimen  5. Coronary atherosclerosis due to calcified coronary lesion of native artery Denies CP  6. Moderate episode of recurrent major depressive disorder ([place]) I think she would feel better if could find an antidepressant she would take, but she has an objection to anything that is suggestion   Alain Honey, MD [place] [phone]

## 2021-01-13 NOTE — Patient Instructions (Signed)
Talk to Dr [name] about your concern with Amiodarone  Begin Lexapro

## 2021-01-20 ENCOUNTER — Other Ambulatory Visit: Payer: Self-pay | Admitting: Family Medicine

## 2021-01-20 ENCOUNTER — Other Ambulatory Visit: Payer: Self-pay | Admitting: Cardiology

## 2021-01-20 DIAGNOSIS — K219 Gastro-esophageal reflux disease without esophagitis: Secondary | ICD-10-CM

## 2021-01-20 NOTE — Telephone Encounter (Signed)
Prescription refill request for Xarelto received.  Indication: Afib  Last office visit:01/01/21 ([name])  [place] Age:87 Scr:1.25 (01/01/21)  CrCl: 41ml/min   Appropriate dose and refill sent to requested pharmacy.

## 2021-01-22 ENCOUNTER — Telehealth: Payer: Self-pay

## 2021-01-22 DIAGNOSIS — K219 Gastro-esophageal reflux disease without esophagitis: Secondary | ICD-10-CM

## 2021-01-22 MED ORDER — RABEPRAZOLE SODIUM 20 MG PO TBEC: DELAYED_RELEASE_TABLET | ORAL | 1 refills | Status: DC

## 2021-01-22 NOTE — Telephone Encounter (Signed)
Incoming call received from patient stating Dr.Miller was suppose to send rx for Aciphex generic to [facility] and she has not heard anything from the pharmacy.   After checking patients record it appears [facility]Miller [facility] versus [facility]. RX was sent to mail order pharmacy at this time

## 2021-01-27 ENCOUNTER — Telehealth: Payer: Self-pay

## 2021-01-27 NOTE — Telephone Encounter (Signed)
**Note De-Identified  Obfuscation** I started a Jardiance PA through covermymeds. Key: [employer] Key: [id number] - PA Case ID: [id number] - Rx #: [id number] Outcome:  Your PA request has been approved. Additional information will be provided in the approval communication. Drug: Jardiance 10MG  tablets Form: Charity fundraiser PA Form (2017 NCPDP)  I [facility] notified [facility] of this approval.

## 2021-01-28 ENCOUNTER — Telehealth: Payer: Self-pay

## 2021-01-28 NOTE — Telephone Encounter (Signed)
Received fax from [facility] prior authorization approved for Jardiance.

## 2021-03-14 NOTE — Progress Notes (Signed)
[name] Date of Birth: [birth year] Medical Record #[id number]  History of Present Illness: [name] is seen today for follow up. She has multiple medical issues which include a cardiomyopathy, underlying BiV/ICD which resulted in improvement of her EF, LBBB, HTN, thyroid disease, melanoma, anxiety/depression and chronic systolic HF.   In September 2018 she was noted in device clinic to have paroxysmal Afib with controlled rate. She was started on Eliquis. [place] Vasc score of at least 4. She developed itching on Eliquis and switched to Xarelto. Itching did not go away so she was switched to Coumadin. After about a month the itching resolved.  She was later switched back to Xarelto without rash. She had change out of her ICD generator in November due to ERI. Her device was downgraded to Biventricular pacing only. Follow up with Dr Lovena Le  In February showed normal device function.  She was seen in the office on 04/28/20 with complaints of increased SOB, edema and fatigue. Was found to be in Afib with rate 125. Patient admitted from the office with acute on chronic CHF felt to be secondary to atrial fibrillation with RVR. BNP elevated at 1,299. Chest x-ray consistent with CHF with mild pulmonary edema and small bilateral pleural effusions. Echo showed LVEF of 25-30% (down from 55-60% in 06/2019) with global hypokinesis and moderate to severe MR. RV mildly enlarged with mildly reduced systolic function, severe TR, and moderately elevated PASP. Also showed small pericardial effusion. Patient was diuresed with IV Lasix. Net negative 8.078 L during admission. Discharge weight 117lb but this may not be accurate (was 130.5lbs on 05/19/2020). Was discharged on Lasix 40mg  daily, Losartan 25mg  daily (rather than home Trandolapril), and Toprol-XL 25mg  daily (rather than home Lopressor). Did not add spironolactone  given soft BP at times. Patient also does not want to be on a lot of new medications. She was  considered for Tikosyn but did not want to start this. She was started on amiodarone.  Drop in EF felt to be secondary to atrial fibrillation with RVR. Given advance age, it was recommended to treat atrial fibrillation and heart failure medically and then reassess EF in 3 months. No ischemic evaluation felt to be necessary this admission.     On her last visit we added aldactone 25 mg daily. This resulted in  increase in creatinine and potassium aldactone was discontinued.   Repeat Echo in August showed improvement in EF to 45-50%. Last device check in August showed no recurrent Afib. She never did start Jardiance and in fact she does have a history of frequent UTIs.   On follow up she complains that she fatigues easily and is off balance. Thinks her legs are weak. Reports pain in the back of her thigh.   Current Outpatient Medications  Medication Sig Dispense Refill   acetaminophen (TYLENOL) 500 MG tablet Take 1,000 mg by mouth every 8 (eight) hours as needed for moderate pain or mild pain.     amiodarone (PACERONE) 200 MG tablet Take 1 tablet (200 mg total) by mouth daily. 90 tablet 3   Ascorbic Acid (VITAMIN C) 1000 MG tablet Take 1,000 mg by mouth at bedtime. Ester C     Biotin 5000 MCG TABS Take 5,000 mcg by mouth daily.     Calcium Carbonate-Vitamin D (CALCIUM + D PO) Take 2 tablets by mouth daily. 1000 units Vitamin D 1200 mg calcium     Cyanocobalamin (VITAMIN B 12) 500 MCG TABS Take 1,000 mcg by mouth  daily.      estradiol (ESTRACE) 0.1 MG/GM vaginal cream Place 1 g vaginally 2 (two) times a week.     fexofenadine (ALLEGRA) 180 MG tablet Take 180 mg by mouth daily.     furosemide (LASIX) 40 MG tablet TAKE 1 TABLET BY MOUTH EVERY DAY 90 tablet 3   losartan (COZAAR) 25 MG tablet TAKE 1 TABLET BY MOUTH EVERY DAY 30 tablet 11   metoprolol succinate (TOPROL-XL) 25 MG 24 hr tablet TAKE 1 TABLET BY MOUTH EVERY DAY 30 tablet 11   Polyethyl Glycol-Propyl Glycol (SYSTANE) 0.4-0.3 % SOLN Place 1  drop into both eyes daily as needed (dry eyes).     RABEprazole (ACIPHEX) 20 MG tablet Take one tablet by mouth once daily for stomach 90 tablet 1   SYNTHROID 100 MCG tablet TAKE 1 TABLET BY MOUTH DAILY BEFORE BREAKFAST. 90 tablet 1   XARELTO 15 MG TABS tablet TAKE 1 TABLET DAILY WITH   SUPPER (DOSE REDUCED) 90 tablet 3   escitalopram (LEXAPRO) 5 MG tablet Take 1 tablet (5 mg total) by mouth daily. (Patient not taking: Reported on 03/23/2021) 90 tablet 1   No current facility-administered medications for this visit.    Allergies  Allergen Reactions   Hydrocodone Itching   Cephalexin Itching and Swelling   Ciprofloxacin Other (See Comments)    Not indicated due to aneurysm in aorta     Doxycycline Other (See Comments)    Unknown reaction   Escitalopram Other (See Comments)    Dry mouth   Levofloxacin Other (See Comments)    Not indicated due to aneurysm in aorta    Moxifloxacin Other (See Comments)    Not indicated due to aneurysm in aorta    Ofloxacin Other (See Comments)    Unknown reaction   Sertraline Other (See Comments)    insomnia   Sulfamethoxazole Hives    Any sulfa meds.   Tape Other (See Comments)    Burns skin.    Latex Rash    "If pt. Makes contact with or wearing"   Neomycin Rash    Past Medical History:  Diagnosis Date   AAA (abdominal aortic aneurysm)    2.8 cm 05/2018; 5 year [place] per ACR guidelines   Allergy    all year    Anxiety disorder    Arthritis    Barrett's esophagus 04/2008   Benign neoplasm of colon    Blood transfusion without reported diagnosis    long ago per pt   Cataract    removed both eyes   CHF (congestive heart failure) ([place])    Chronic airway obstruction, not elsewhere classified    Complication of anesthesia    slow to wake up   Depressive disorder, not elsewhere classified    Diverticulitis    Dysthymic disorder    Erosive gastritis 08/2004   GERD (gastroesophageal reflux disease)    Hair thinning    HTN (hypertension)     pt denies    Hyperplastic polyps of stomach 06/2002   Hypothyroidism    ICD (implantable cardiac defibrillator) in place    BiV/ICD; s/p removal of ICD with insertion of BiV PPM 04/26/12   Incisional hernia    Insomnia 08/20/2015   Kyphosis 08/20/2015   LBBB (left bundle branch block)    Melanoma (HCC)    right arm   Mitral regurgitation    Nonischemic cardiomyopathy ([place])    EF initially 20%; Last measurement up to 50% per echo in August of  2012   Osteopenia    Other and unspecified hyperlipidemia    Other and unspecified hyperlipidemia    Pacemaker 04/26/2012   PAF (paroxysmal atrial fibrillation) ([place])    11/2016   Pain in joint, lower leg    Palpitations    Pneumonia    Restless legs syndrome (RLS)    Synovial cyst of popliteal space    Unspecified chronic bronchitis (HCC)    Unspecified nasal polyp    Unspecified sinusitis (chronic)     Past Surgical History:  Procedure Laterality Date   ABDOMINAL HYSTERECTOMY  1076   endometriosis   APPENDECTOMY     BI-VENTRICULAR PACEMAKER INSERTION N/A 04/26/2012   Procedure: BI-VENTRICULAR PACEMAKER INSERTION (CRT-P);  Surgeon: Evans Lance, MD;  Location[facility] CATH LAB;  Service: Cardiovascular;  Laterality: N/A;   BIV PACEMAKER GENERATOR CHANGEOUT N/A 01/26/2019   Procedure: BIV PACEMAKER GENERATOR CHANGEOUT;  Surgeon: Evans Lance, MD;  Location: [place] CV LAB;  Service: Cardiovascular;  Laterality: N/A;   CARDIOVERSION N/A 05/06/2020   Procedure: CARDIOVERSION;  Surgeon: Pixie Casino, MD;  Location: [place];  Service: Cardiovascular;  Laterality: N/A;   CARDIOVERSION N/A 05/19/2020   Procedure: CARDIOVERSION;  Surgeon: Jerline Pain, MD;  Location: [facility] ENDOSCOPY;  Service: Cardiovascular;  Laterality: N/A;   COLONOSCOPY     defibrillator insertion     s/p removal of previously implanted BiV ICD and insertion of a new BiV pacemaker on 04/26/12   excise mole of lip  2001   Dr. Larena Sox   excision of wen  1984   Dr.  Parks Ranger   FEMORAL HERNIA REPAIR  12/25/2012   Dr Dalbert Batman   FEMORAL HERNIA REPAIR  01/04/2013   Recurrent - Dr. Nash Mantis HERNIA REPAIR N/A 10/01/2013   Procedure: LAPAROSCOPIC INCISIONAL HERNIA;  Surgeon: Gayland Curry, MD;  Location: [facility];  Service: General;  Laterality: N/A;   INCISIONAL HERNIA REPAIR N/A 09/07/2018   Procedure: LAPAROSCOPIC ASSISTED REPAIR OF INCISIONAL HERNIA;  Surgeon: Greer Pickerel, MD;  Location: [place];  Service: General;  Laterality: N/A;   INGUINAL HERNIA REPAIR Right 09/26/2012   Procedure: RIGHT INGUINAL HERNIA REPAIR WITH MESH;  Surgeon: Odis Hollingshead, MD;  Location: [place];  Service: General;  Laterality: Right;   INGUINAL HERNIA REPAIR Right 12/25/2012   Procedure: explor right groin, small bowel rescection, tissue repair right femoral hernia;  Surgeon: Adin Hector, MD;  Location: [facility];  Service: General;  Laterality: Right;   INSERTION OF MESH Right 09/26/2012   Procedure: INSERTION OF MESH;  Surgeon: Odis Hollingshead, MD;  Location: [place];  Service: General;  Laterality: Right;   INSERTION OF MESH N/A 10/01/2013   Procedure: INSERTION OF MESH;  Surgeon: Gayland Curry, MD;  Location: [facility];  Service: General;  Laterality: N/A;   LAPAROSCOPY N/A 01/04/2013   Procedure: Diagnostic Laparoscopy, exploratory laparotomy with small bowel resection, closure of right femoral hernia repair;  Surgeon: Madilyn Hook, DO;  Location: [facility];  Service: General;  Laterality: N/A;   Left arm surgery     POLYPECTOMY     RIGHT BREAST LUMPECTOMY  1988   Dr. Marylene Buerger   right knee surgery     arthroscopy: Dr. Shellia Carwin   TONSILLECTOMY     UPPER GASTROINTESTINAL ENDOSCOPY      Social History   Tobacco Use  Smoking Status Former   Packs/day: 1.00   Years: 15.00   Pack years: 15.00   Types:  Cigarettes   Quit date: 12/22/1993   Years since quitting: 27.2  Smokeless Tobacco Never  Tobacco Comments   Does'nt reall year quit     Social History    Substance and Sexual Activity  Alcohol Use No   Alcohol/week: 0.0 standard drinks    Family History  Problem Relation Age of Onset   Stroke Father    Heart disease Other        maternal side   Colon cancer Paternal Uncle    Breast cancer Maternal Aunt    Colon cancer Cousin    Diabetes Neg Hx    Colon polyps Neg Hx    Rectal cancer Neg Hx    Stomach cancer Neg Hx     Review of Systems: The review of systems is per the HPI.   All other systems were reviewed and are negative.  Physical Exam: BP 135/72    Pulse (!) 55    Ht 5' 8.05" (1.728 m)    Wt 128 lb 9.6 oz (58.3 kg)    SpO2 99%    BMI 19.52 kg/m  GENERAL:  thin, elderly thin WF in NAD HEENT:  PERRL, EOMI, sclera are clear. Oropharynx is clear. NECK:  No JVD, carotid upstroke brisk and symmetric, no bruits, no thyromegaly or adenopathy LUNGS:  Clear to auscultation bilaterally CHEST:  Unremarkable HEART:  RRR,  PMI not displaced or sustained,S1 and S2 within normal limits, no S3, no S4: no clicks, no rubs, no murmurs ABD:  Soft, nontender. BS +, no masses or bruits. No hepatomegaly, no splenomegaly EXT:  2 + pulses throughout, 1+ edema, varicosities.  SKIN:  Warm and dry.  No rashes NEURO:  Alert and oriented x 3. Cranial nerves II through XII intact. PSYCH:  Cognitively intact   Wt Readings from Last 3 Encounters:  03/23/21 128 lb 9.6 oz (58.3 kg)  01/13/21 129 lb (58.5 kg)  01/01/21 129 lb 6.4 oz (58.7 kg)     LABORATORY DATA: Lab Results  Component Value Date   WBC 6.6 01/01/2021   HGB 11.8 01/01/2021   HCT 35.6 01/01/2021   PLT 205 01/01/2021   GLUCOSE 81 01/01/2021   CHOL 159 11/22/2019   TRIG 137 11/22/2019   HDL 53 11/22/2019   LDLCALC 82 11/22/2019   ALT 12 01/01/2021   AST 21 01/01/2021   NA 138 01/01/2021   K 4.9 01/01/2021   CL 101 01/01/2021   CREATININE 1.25 (H) 01/01/2021   BUN 31 (H) 01/01/2021   CO2 21 01/01/2021   TSH 4.330 01/01/2021   INR 1.7 (H) 05/18/2020     Echo:  07/22/16: Study Conclusions   - Left ventricle: The cavity size was mildly dilated. Wall   thickness was normal. Systolic function was normal. The estimated   ejection fraction was in the range of 50% to 55%. Wall motion was   normal; there were no regional wall motion abnormalities. - Aortic valve: There was trivial regurgitation. - Mitral valve: There was mild regurgitation. - Atrial septum: No defect or patent foramen ovale was identified.   Echo 06/29/19: IMPRESSIONS     1. Left ventricular ejection fraction, by estimation, is 55 to 60%. The  left ventricle has normal function. The left ventricle has no regional  wall motion abnormalities. Left ventricular diastolic parameters are  consistent with Grade II diastolic  dysfunction (pseudonormalization). Elevated left atrial pressure.   2. Right ventricular systolic function is normal. The right ventricular  size is normal. There is  moderately elevated pulmonary artery systolic  pressure.   3. Left atrial size was moderately dilated.   4. The mitral valve is normal in structure. Mild to moderate mitral valve  regurgitation.   5. Tricuspid valve regurgitation is mild to moderate.   6. The aortic valve is tricuspid. Aortic valve regurgitation is mild. No  aortic stenosis is present.   7. Aortic dilatation noted. There is borderline dilatation of the  ascending aorta measuring 37 mm.   8. The inferior vena cava is dilated in size with >50% respiratory  variability, suggesting right atrial pressure of 8 mmHg.    Echocardiogram 05/17/2020: Impressions:  1. Left ventricular ejection fraction, by estimation, is 25 to 30%. The  left ventricle has severely decreased function. The left ventricle  demonstrates global hypokinesis. Left ventricular diastolic function could  not be evaluated.   2. Right ventricular systolic function is mildly reduced. The right  ventricular size is mildly enlarged. There is moderately elevated  pulmonary  artery systolic pressure. The estimated right ventricular  systolic pressure is 10.1 mmHg.   3. Left atrial size was severely dilated.   4. Right atrial size was mildly dilated.   5. A small pericardial effusion is present. The pericardial effusion is  circumferential.   6. The mitral valve is grossly normal. Moderate to severe mitral valve  regurgitation. No evidence of mitral stenosis.   7. Tricuspid valve regurgitation is severe.   8. The aortic valve is tricuspid. Aortic valve regurgitation is not  visualized. No aortic stenosis is present.   9. The inferior vena cava is dilated in size with <50% respiratory  variability, suggesting right atrial pressure of 15 mmHg.   Comparison(s): Changes from prior study are noted. EF now severely  reduced. Moderately dilated RV. Moderate to severe MR. Severe TR. RVSP ~49  mmHG.   Echo 10/09/20: IMPRESSIONS     1. Significant improvement in EF since echo done 05/17/20. Left  ventricular ejection fraction, by estimation, is 45 to 50%. The left  ventricle has mildly decreased function. The left ventricle demonstrates  global hypokinesis. The left ventricular  internal cavity size was mildly dilated. Left ventricular diastolic  parameters were normal.   2. ICD wires in RA/RV. Right ventricular systolic function is normal. The  right ventricular size is normal. There is mildly elevated pulmonary  artery systolic pressure.   3. Left atrial size was moderately dilated.   4. The pericardial effusion is posterior to the left ventricle.   5. ? MR from restricted posterior leaflet motion . The mitral valve is  abnormal. Moderate mitral valve regurgitation. No evidence of mitral  stenosis.   6. Tricuspid valve regurgitation is moderate.   7. The aortic valve is tricuspid. Aortic valve regurgitation is trivial.  No aortic stenosis is present.   8. The inferior vena cava is normal in size with greater than 50%  respiratory variability, suggesting right  atrial pressure of 3 mmHg.   Assessment / Plan: 1.  History of dialted Cardiomyopathy with chronic systolic CHF.  EF  normal in April 2021. Has BiV pacemaker in place. Significant worsening of CHF in Feb. EF down to 25-30%.  Suspect this was mostly related to her Afib with RVR. Clinically improved.  Weight is stable  Swelling is mild today. Maintaining NSR on exam.  Repeat Echo in August showed improved EF to 45-50%. Will continue Lasix, losartan and Toprol. Not a candidate for SGLT 2 inhibitor due to history of UTIs.  Encouraged her  to wear support hose.  2. NSVT. Follow up ICD/biV in device clinic. S/p generator change out in November 2020. Normal device function. Looks like she is due for device check.  3. Paroxysmal Afib. persistent and ERAD post cardioversion on May 06, 2020. RVR. Repeat DCCV post amiodarone. Now in NSR. [place] Vasc score of at least 4. On Xarelto. Needs to stay on amiodarone - will reduce dose to 100 mg daily given her concern about leg weakness but have counseled her on the risk of recurrent Afib.  4. Small AAA and left iliac aneurysm. Follow up with [facility]. Last doppler showed AAA was 2.9 cm.     5. Venous varicosities. Encourage sodium restriction and support hose.   6. Imbalance. Possibly exacerbated by amiodarone. Will reduce dose as noted. Other options for treating Afib limited. I have encouraged her to use a cane with walking.    Follow up in 4 months.

## 2021-03-23 ENCOUNTER — Encounter: Payer: Self-pay | Admitting: Cardiology

## 2021-03-23 ENCOUNTER — Other Ambulatory Visit: Payer: Self-pay

## 2021-03-23 ENCOUNTER — Ambulatory Visit (INDEPENDENT_AMBULATORY_CARE_PROVIDER_SITE_OTHER): Payer: Medicare Other | Admitting: Cardiology

## 2021-03-23 ENCOUNTER — Ambulatory Visit: Payer: Medicare Other | Admitting: Cardiology

## 2021-03-23 VITALS — BP 135/72 | HR 55 | Ht 68.05 in | Wt 128.6 lb

## 2021-03-23 DIAGNOSIS — F331 Major depressive disorder, recurrent, moderate: Secondary | ICD-10-CM | POA: Diagnosis not present

## 2021-03-23 DIAGNOSIS — I5023 Acute on chronic systolic (congestive) heart failure: Secondary | ICD-10-CM

## 2021-03-23 DIAGNOSIS — J418 Mixed simple and mucopurulent chronic bronchitis: Secondary | ICD-10-CM

## 2021-03-23 DIAGNOSIS — I4819 Other persistent atrial fibrillation: Secondary | ICD-10-CM

## 2021-03-23 DIAGNOSIS — I739 Peripheral vascular disease, unspecified: Secondary | ICD-10-CM

## 2021-03-23 DIAGNOSIS — E440 Moderate protein-calorie malnutrition: Secondary | ICD-10-CM | POA: Diagnosis not present

## 2021-03-23 DIAGNOSIS — L97322 Non-pressure chronic ulcer of left ankle with fat layer exposed: Secondary | ICD-10-CM | POA: Diagnosis not present

## 2021-03-23 MED ORDER — AMIODARONE HCL 200 MG PO TABS: 100.0000 mg | ORAL_TABLET | Freq: Every day | ORAL | 3 refills | Status: DC

## 2021-03-23 NOTE — Addendum Note (Signed)
Addended by: Kathyrn Lass on: 03/23/2021 05:33 PM   Modules accepted: Orders

## 2021-03-23 NOTE — Patient Instructions (Addendum)
Medication Instructions:  Decrease Amiodarone to 100 mg daily Continue all other medications *If you need a refill on your cardiac medications before your next appointment, please call your pharmacy*   Lab Work: None ordered   Testing/Procedures: None ordered   Follow-Up: At [facility], you and your health needs are our priority.  As part of our continuing mission to provide you with exceptional heart care, we have created designated Provider Care Teams.  These Care Teams include your primary Cardiologist (physician) and Advanced Practice Providers (APPs -  Physician Assistants and Nurse Practitioners) who all work together to provide you with the care you need, when you need it.  We recommend signing up for the patient portal called "MyChart".  Sign up information is provided on this After Visit Summary.  MyChart is used to connect with patients for Virtual Visits (Telemedicine).  Patients are able to view lab/test results, encounter notes, upcoming appointments, etc.  Non-urgent messages can be sent to your provider as well.   To learn more about what you can do with MyChart, go to [email or web address].      Your next appointment:  Monday 07/27/21 at 4:20 pm    The format for your next appointment: Office    Provider:  Dr.Jordan

## 2021-03-26 ENCOUNTER — Encounter: Payer: Medicare Other | Admitting: Internal Medicine

## 2021-04-07 ENCOUNTER — Encounter (HOSPITAL_COMMUNITY): Payer: Self-pay | Admitting: Emergency Medicine

## 2021-04-07 ENCOUNTER — Other Ambulatory Visit: Payer: Self-pay

## 2021-04-07 ENCOUNTER — Emergency Department (HOSPITAL_COMMUNITY): Payer: Medicare Other

## 2021-04-07 ENCOUNTER — Inpatient Hospital Stay (HOSPITAL_COMMUNITY)
Admission: EM | Admit: 2021-04-07 | Discharge: 2021-04-10 | DRG: 536 | Disposition: A | Discharge status: Skilled Nursing Facility | Payer: Medicare Other | Attending: Internal Medicine | Admitting: Internal Medicine

## 2021-04-07 DIAGNOSIS — Z91040 Latex allergy status: Secondary | ICD-10-CM

## 2021-04-07 DIAGNOSIS — Z79890 Hormone replacement therapy (postmenopausal): Secondary | ICD-10-CM

## 2021-04-07 DIAGNOSIS — F331 Major depressive disorder, recurrent, moderate: Secondary | ICD-10-CM | POA: Diagnosis not present

## 2021-04-07 DIAGNOSIS — W19XXXA Unspecified fall, initial encounter: Secondary | ICD-10-CM

## 2021-04-07 DIAGNOSIS — M1990 Unspecified osteoarthritis, unspecified site: Secondary | ICD-10-CM | POA: Diagnosis not present

## 2021-04-07 DIAGNOSIS — I428 Other cardiomyopathies: Secondary | ICD-10-CM | POA: Diagnosis present

## 2021-04-07 DIAGNOSIS — R531 Weakness: Secondary | ICD-10-CM | POA: Diagnosis not present

## 2021-04-07 DIAGNOSIS — S32501S Unspecified fracture of right pubis, sequela: Secondary | ICD-10-CM | POA: Diagnosis not present

## 2021-04-07 DIAGNOSIS — Z950 Presence of cardiac pacemaker: Secondary | ICD-10-CM | POA: Diagnosis not present

## 2021-04-07 DIAGNOSIS — I10 Essential (primary) hypertension: Secondary | ICD-10-CM | POA: Diagnosis not present

## 2021-04-07 DIAGNOSIS — I4819 Other persistent atrial fibrillation: Secondary | ICD-10-CM

## 2021-04-07 DIAGNOSIS — Y92017 Garden or yard in single-family (private) house as the place of occurrence of the external cause: Secondary | ICD-10-CM

## 2021-04-07 DIAGNOSIS — I110 Hypertensive heart disease with heart failure: Secondary | ICD-10-CM | POA: Diagnosis present

## 2021-04-07 DIAGNOSIS — W010XXA Fall on same level from slipping, tripping and stumbling without subsequent striking against object, initial encounter: Secondary | ICD-10-CM | POA: Diagnosis present

## 2021-04-07 DIAGNOSIS — S3210XA Unspecified fracture of sacrum, initial encounter for closed fracture: Secondary | ICD-10-CM | POA: Diagnosis not present

## 2021-04-07 DIAGNOSIS — Z800 Family history of malignant neoplasm of digestive organs: Secondary | ICD-10-CM

## 2021-04-07 DIAGNOSIS — M47817 Spondylosis without myelopathy or radiculopathy, lumbosacral region: Secondary | ICD-10-CM | POA: Diagnosis not present

## 2021-04-07 DIAGNOSIS — T148XXA Other injury of unspecified body region, initial encounter: Secondary | ICD-10-CM | POA: Diagnosis not present

## 2021-04-07 DIAGNOSIS — E559 Vitamin D deficiency, unspecified: Secondary | ICD-10-CM | POA: Diagnosis present

## 2021-04-07 DIAGNOSIS — Z87891 Personal history of nicotine dependence: Secondary | ICD-10-CM

## 2021-04-07 DIAGNOSIS — Z881 Allergy status to other antibiotic agents status: Secondary | ICD-10-CM

## 2021-04-07 DIAGNOSIS — Y92009 Unspecified place in unspecified non-institutional (private) residence as the place of occurrence of the external cause: Secondary | ICD-10-CM

## 2021-04-07 DIAGNOSIS — Z85820 Personal history of malignant melanoma of skin: Secondary | ICD-10-CM | POA: Diagnosis not present

## 2021-04-07 DIAGNOSIS — G4700 Insomnia, unspecified: Secondary | ICD-10-CM | POA: Diagnosis not present

## 2021-04-07 DIAGNOSIS — I5022 Chronic systolic (congestive) heart failure: Secondary | ICD-10-CM

## 2021-04-07 DIAGNOSIS — Z803 Family history of malignant neoplasm of breast: Secondary | ICD-10-CM | POA: Diagnosis not present

## 2021-04-07 DIAGNOSIS — S32599A Other specified fracture of unspecified pubis, initial encounter for closed fracture: Secondary | ICD-10-CM | POA: Diagnosis present

## 2021-04-07 DIAGNOSIS — Z20822 Contact with and (suspected) exposure to covid-19: Secondary | ICD-10-CM | POA: Diagnosis present

## 2021-04-07 DIAGNOSIS — E039 Hypothyroidism, unspecified: Secondary | ICD-10-CM

## 2021-04-07 DIAGNOSIS — Z885 Allergy status to narcotic agent status: Secondary | ICD-10-CM

## 2021-04-07 DIAGNOSIS — S32599D Other specified fracture of unspecified pubis, subsequent encounter for fracture with routine healing: Secondary | ICD-10-CM | POA: Diagnosis not present

## 2021-04-07 DIAGNOSIS — Z79899 Other long term (current) drug therapy: Secondary | ICD-10-CM

## 2021-04-07 DIAGNOSIS — I672 Cerebral atherosclerosis: Secondary | ICD-10-CM | POA: Diagnosis not present

## 2021-04-07 DIAGNOSIS — S79911A Unspecified injury of right hip, initial encounter: Secondary | ICD-10-CM | POA: Diagnosis present

## 2021-04-07 DIAGNOSIS — Z823 Family history of stroke: Secondary | ICD-10-CM

## 2021-04-07 DIAGNOSIS — M25461 Effusion, right knee: Secondary | ICD-10-CM | POA: Diagnosis not present

## 2021-04-07 DIAGNOSIS — I480 Paroxysmal atrial fibrillation: Secondary | ICD-10-CM | POA: Diagnosis not present

## 2021-04-07 DIAGNOSIS — S32591A Other specified fracture of right pubis, initial encounter for closed fracture: Principal | ICD-10-CM

## 2021-04-07 DIAGNOSIS — F419 Anxiety disorder, unspecified: Secondary | ICD-10-CM | POA: Diagnosis not present

## 2021-04-07 DIAGNOSIS — K219 Gastro-esophageal reflux disease without esophagitis: Secondary | ICD-10-CM | POA: Diagnosis not present

## 2021-04-07 DIAGNOSIS — S32511A Fracture of superior rim of right pubis, initial encounter for closed fracture: Secondary | ICD-10-CM | POA: Diagnosis not present

## 2021-04-07 DIAGNOSIS — M1611 Unilateral primary osteoarthritis, right hip: Secondary | ICD-10-CM | POA: Diagnosis not present

## 2021-04-07 DIAGNOSIS — Z7901 Long term (current) use of anticoagulants: Secondary | ICD-10-CM | POA: Diagnosis not present

## 2021-04-07 DIAGNOSIS — I714 Abdominal aortic aneurysm, without rupture, unspecified: Secondary | ICD-10-CM | POA: Diagnosis present

## 2021-04-07 DIAGNOSIS — I495 Sick sinus syndrome: Secondary | ICD-10-CM | POA: Diagnosis present

## 2021-04-07 DIAGNOSIS — Z7401 Bed confinement status: Secondary | ICD-10-CM | POA: Diagnosis not present

## 2021-04-07 DIAGNOSIS — Z882 Allergy status to sulfonamides status: Secondary | ICD-10-CM

## 2021-04-07 DIAGNOSIS — M160 Bilateral primary osteoarthritis of hip: Secondary | ICD-10-CM | POA: Diagnosis present

## 2021-04-07 DIAGNOSIS — Z888 Allergy status to other drugs, medicaments and biological substances status: Secondary | ICD-10-CM

## 2021-04-07 DIAGNOSIS — K4090 Unilateral inguinal hernia, without obstruction or gangrene, not specified as recurrent: Secondary | ICD-10-CM | POA: Diagnosis not present

## 2021-04-07 DIAGNOSIS — M2578 Osteophyte, vertebrae: Secondary | ICD-10-CM | POA: Diagnosis not present

## 2021-04-07 DIAGNOSIS — E538 Deficiency of other specified B group vitamins: Secondary | ICD-10-CM | POA: Diagnosis not present

## 2021-04-07 DIAGNOSIS — M25551 Pain in right hip: Secondary | ICD-10-CM | POA: Diagnosis not present

## 2021-04-07 DIAGNOSIS — Z043 Encounter for examination and observation following other accident: Secondary | ICD-10-CM | POA: Diagnosis not present

## 2021-04-07 DIAGNOSIS — G2581 Restless legs syndrome: Secondary | ICD-10-CM | POA: Diagnosis not present

## 2021-04-07 DIAGNOSIS — K5730 Diverticulosis of large intestine without perforation or abscess without bleeding: Secondary | ICD-10-CM | POA: Diagnosis not present

## 2021-04-07 DIAGNOSIS — E785 Hyperlipidemia, unspecified: Secondary | ICD-10-CM | POA: Diagnosis not present

## 2021-04-07 DIAGNOSIS — J329 Chronic sinusitis, unspecified: Secondary | ICD-10-CM | POA: Diagnosis not present

## 2021-04-07 DIAGNOSIS — M47816 Spondylosis without myelopathy or radiculopathy, lumbar region: Secondary | ICD-10-CM | POA: Diagnosis not present

## 2021-04-07 DIAGNOSIS — S3282XD Multiple fractures of pelvis without disruption of pelvic ring, subsequent encounter for fracture with routine healing: Secondary | ICD-10-CM | POA: Diagnosis not present

## 2021-04-07 DIAGNOSIS — R400 Somnolence: Secondary | ICD-10-CM | POA: Diagnosis not present

## 2021-04-07 LAB — CBC WITH DIFFERENTIAL/PLATELET
Abs Immature Granulocytes: 0.05 10*3/uL (ref 0.00–0.07)
Basophils Absolute: 0 10*3/uL (ref 0.0–0.1)
Basophils Relative: 0 %
Eosinophils Absolute: 0 10*3/uL (ref 0.0–0.5)
Eosinophils Relative: 0 %
HCT: 34.7 % — ABNORMAL LOW (ref 36.0–46.0)
Hemoglobin: 11.4 g/dL — ABNORMAL LOW (ref 12.0–15.0)
Immature Granulocytes: 1 %
Lymphocytes Relative: 6 %
Lymphs Abs: 0.6 10*3/uL — ABNORMAL LOW (ref 0.7–4.0)
MCH: 30.9 pg (ref 26.0–34.0)
MCHC: 32.9 g/dL (ref 30.0–36.0)
MCV: 94 fL (ref 80.0–100.0)
Monocytes Absolute: 1.2 10*3/uL — ABNORMAL HIGH (ref 0.1–1.0)
Monocytes Relative: 12 %
Neutro Abs: 7.9 10*3/uL — ABNORMAL HIGH (ref 1.7–7.7)
Neutrophils Relative %: 81 %
Platelets: 157 10*3/uL (ref 150–400)
RBC: 3.69 MIL/uL — ABNORMAL LOW (ref 3.87–5.11)
RDW: 13.2 % (ref 11.5–15.5)
WBC: 9.7 10*3/uL (ref 4.0–10.5)
nRBC: 0 % (ref 0.0–0.2)

## 2021-04-07 LAB — COMPREHENSIVE METABOLIC PANEL
ALT: 24 U/L (ref 0–44)
AST: 53 U/L — ABNORMAL HIGH (ref 15–41)
Albumin: 3.5 g/dL (ref 3.5–5.0)
Alkaline Phosphatase: 67 U/L (ref 38–126)
Anion gap: 9 (ref 5–15)
BUN: 21 mg/dL (ref 8–23)
CO2: 24 mmol/L (ref 22–32)
Calcium: 9.3 mg/dL (ref 8.9–10.3)
Chloride: 110 mmol/L (ref 98–111)
Creatinine, Ser: 1.1 mg/dL — ABNORMAL HIGH (ref 0.44–1.00)
GFR, Estimated: 49 mL/min — ABNORMAL LOW (ref >60)
Glucose, Bld: 116 mg/dL — ABNORMAL HIGH (ref 70–99)
Potassium: 4.1 mmol/L (ref 3.5–5.1)
Sodium: 143 mmol/L (ref 135–145)
Total Bilirubin: 1.3 mg/dL — ABNORMAL HIGH (ref 0.3–1.2)
Total Protein: 5.9 g/dL — ABNORMAL LOW (ref 6.5–8.1)

## 2021-04-07 LAB — RESP PANEL BY RT-PCR (FLU A&B, COVID) ARPGX2
Influenza A by PCR: NEGATIVE
Influenza B by PCR: NEGATIVE
SARS Coronavirus 2 by RT PCR: NEGATIVE

## 2021-04-07 MED ORDER — ACETAMINOPHEN 325 MG PO TABS
650.0000 mg | ORAL_TABLET | Freq: Four times a day (QID) | ORAL | Status: DC | PRN
  Administered 2021-04-08 – 2021-04-10 (×3): 650 mg via ORAL
  Filled 2021-04-07 (×3): qty 2

## 2021-04-07 MED ORDER — FENTANYL CITRATE PF 50 MCG/ML IJ SOSY
25.0000 ug | PREFILLED_SYRINGE | Freq: Once | INTRAMUSCULAR | Status: AC
Start: 2021-04-07 — End: 2021-04-07
  Administered 2021-04-07: 25 ug via INTRAVENOUS
  Filled 2021-04-07: qty 1

## 2021-04-07 MED ORDER — NALOXONE HCL 0.4 MG/ML IJ SOLN: 0.4000 mg | INTRAMUSCULAR | Status: DC | PRN

## 2021-04-07 MED ORDER — FENTANYL CITRATE PF 50 MCG/ML IJ SOSY
50.0000 ug | PREFILLED_SYRINGE | INTRAMUSCULAR | Status: DC | PRN
Start: 2021-04-07 — End: 2021-04-08

## 2021-04-07 MED ORDER — ACETAMINOPHEN 650 MG RE SUPP: 650.0000 mg | Freq: Four times a day (QID) | RECTAL | Status: DC | PRN

## 2021-04-07 NOTE — ED Provider Notes (Signed)
[facility] EMERGENCY DEPARTMENT Provider Note   CSN: [id number] Arrival date & time: 04/07/21  1453     History  Chief Complaint  Patient presents with   [name] is a 88 y.o. female.  Patient with history of CHF and afib presents today with complaints of fall. She states that she originally fell yesterday when she was walking to the mailbox she slipped on wet grass and fell backwards striking her right hip on the ground.  She called the fire department who helped her back into her house.  She was able to ambulate afterwards with significant amount of pain.  Her friend then went and bought her a walker which she has been attempting to ambulate with since.  States that she was up all night due to pain and rolled out of her bed this morning onto the floor where her friend found her this morning and called EMS.  She is anticoagulated with Xarelto, denies hitting her head.  She is normally able to ambulate without assistance or difficulty.  She lives at home alone.  She is currently complaining of neck pain, right hip pain, and right knee pain.   The history is provided by the patient and a friend. No language interpreter was used.  Fall Pertinent negatives include no chest pain and no shortness of breath.      Home Medications Prior to Admission medications   Medication Sig Start Date End Date Taking? Authorizing Provider  acetaminophen (TYLENOL) 500 MG tablet Take 1,000 mg by mouth every 8 (eight) hours as needed for moderate pain or mild pain.    [provider]  amiodarone (PACERONE) 200 MG tablet Take 0.5 tablets (100 mg total) by mouth daily. 03/23/21   [name], Peter M, MD  Ascorbic Acid (VITAMIN C) 1000 MG tablet Take 1,000 mg by mouth at bedtime. Ester C    [provider]  Biotin 5000 MCG TABS Take 5,000 mcg by mouth daily.    [provider]  Calcium Carbonate-Vitamin D (CALCIUM + D PO) Take 2 tablets by mouth daily.  1000 units Vitamin D 1200 mg calcium    [provider]  Cyanocobalamin (VITAMIN B 12) 500 MCG TABS Take 1,000 mcg by mouth daily.     [provider]  escitalopram (LEXAPRO) 5 MG tablet Take 1 tablet (5 mg total) by mouth daily. Patient not taking: Reported on 03/23/2021 01/13/21   Wardell Honour, MD  estradiol (ESTRACE) 0.1 MG/GM vaginal cream Place 1 g vaginally 2 (two) times a week. 06/13/18   [provider]  fexofenadine (ALLEGRA) 180 MG tablet Take 180 mg by mouth daily.    [provider]  furosemide (LASIX) 40 MG tablet TAKE 1 TABLET BY MOUTH EVERY DAY 09/15/20   [name], Peter M, MD  losartan (COZAAR) 25 MG tablet TAKE 1 TABLET BY MOUTH EVERY DAY 07/14/20   [name], Peter M, MD  metoprolol succinate (TOPROL-XL) 25 MG 24 hr tablet TAKE 1 TABLET BY MOUTH EVERY DAY 07/14/20   [name], Peter M, MD  Polyethyl Glycol-Propyl Glycol (SYSTANE) 0.4-0.3 % SOLN Place 1 drop into both eyes daily as needed (dry eyes).    [provider]  RABEprazole (ACIPHEX) 20 MG tablet Take one tablet by mouth once daily for stomach 01/22/21   Wardell Honour, MD  SYNTHROID 100 MCG tablet TAKE 1 TABLET BY MOUTH DAILY BEFORE BREAKFAST. 12/16/20   Wardell Honour, MD  XARELTO 15 MG  TABS tablet TAKE 1 TABLET DAILY WITH   SUPPER (DOSE REDUCED) 01/20/21   [name], Peter M, MD      Allergies    Hydrocodone, Cephalexin, Ciprofloxacin, Doxycycline, Escitalopram, Levofloxacin, Moxifloxacin, Ofloxacin, Sertraline, Sulfamethoxazole, Tape, Latex, and Neomycin    Review of Systems   Review of Systems  Constitutional:  Negative for chills and fever.  Respiratory:  Negative for shortness of breath.   Cardiovascular:  Negative for chest pain.  Gastrointestinal:  Negative for nausea and vomiting.  Musculoskeletal:  Positive for arthralgias, gait problem and neck pain.  All other systems reviewed and are negative.  Physical Exam Updated Vital Signs BP (!) 156/98    Pulse 76    Temp  98.4 F (36.9 C) (Oral)    Resp 14    SpO2 95%  Physical Exam Vitals and nursing note reviewed.  Constitutional:      General: She is not in acute distress.    Appearance: Normal appearance. She is normal weight. She is not ill-appearing, toxic-appearing or diaphoretic.     Comments: Patient resting comfortably in bed in no acute distress  HENT:     Head: Normocephalic and atraumatic.     Comments: No Battle's sign or racoon eyes Eyes:     Extraocular Movements: Extraocular movements intact.     Pupils: Pupils are equal, round, and reactive to light.  Neck:     Comments: No midline tenderness, stepoffs, or deformity noted to cervical, thoracic, or lumbar spine Cardiovascular:     Rate and Rhythm: Normal rate and regular rhythm.     Heart sounds: Normal heart sounds.  Pulmonary:     Effort: Pulmonary effort is normal. No respiratory distress.     Breath sounds: Normal breath sounds.  Abdominal:     General: Abdomen is flat.     Palpations: Abdomen is soft.  Musculoskeletal:     Cervical back: Normal range of motion.     Comments: Tenderness noted to palpation of right illiac crest. No obvious bruising or deformity noted. No rotation or shortening noted to right leg. DP and PT pulses intact and 2+  Tenderness noted to palpation of right knee. No obvious bruising or deformity   Skin:    General: Skin is warm and dry.  Neurological:     General: No focal deficit present.     Mental Status: She is alert.  Psychiatric:        Mood and Affect: Mood normal.        Behavior: Behavior normal.    ED Results / Procedures / Treatments   Labs (all labs ordered are listed, but only abnormal results are displayed) Labs Reviewed  CBC WITH DIFFERENTIAL/PLATELET - Abnormal; Notable for the following components:      Result Value   RBC 3.69 (*)    Hemoglobin 11.4 (*)    HCT 34.7 (*)    Neutro Abs 7.9 (*)    Lymphs Abs 0.6 (*)    Monocytes Absolute 1.2 (*)    All other components  within normal limits  COMPREHENSIVE METABOLIC PANEL - Abnormal; Notable for the following components:   Glucose, Bld 116 (*)    Creatinine, Ser 1.10 (*)    Total Protein 5.9 (*)    AST 53 (*)    Total Bilirubin 1.3 (*)    GFR, Estimated 49 (*)    All other components within normal limits  URINALYSIS, ROUTINE W REFLEX MICROSCOPIC    EKG EKG Interpretation  Date/Time:  Tuesday  April 07 2021 15:06:56 EST Ventricular Rate:  74 PR Interval:    QRS Duration: 187 QT Interval:  526 QTC Calculation: 584 R Axis:   149 Text Interpretation: AV dual-paced rhythm Abnormal ekg No significant change since last tracing Confirmed by Calvert Cantor [phone]) on 04/07/2021 3:35:15 PM  Radiology CT Head Wo Contrast  Result Date: 04/07/2021 CLINICAL DATA:  Status post fall, neck trauma EXAM: CT HEAD WITHOUT CONTRAST CT CERVICAL SPINE WITHOUT CONTRAST TECHNIQUE: Multidetector CT imaging of the head and cervical spine was performed following the standard protocol without intravenous contrast. Multiplanar CT image reconstructions of the cervical spine were also generated. RADIATION DOSE REDUCTION: This exam was performed according to the departmental dose-optimization program which includes automated exposure control, adjustment of the mA and/or kV according to patient size and/or use of iterative reconstruction technique. COMPARISON:  None. FINDINGS: Brain: No evidence of acute infarction, hemorrhage, extra-axial collection, ventriculomegaly, or mass effect. Generalized cerebral atrophy. Periventricular white matter low attenuation likely secondary to microangiopathy. Vascular: Cerebrovascular atherosclerotic calcifications are noted. No hyperdense vessels. Skull: Negative for fracture or focal lesion. Sinuses/Orbits: Visualized portions of the orbits are unremarkable. Visualized portions of the paranasal sinuses are unremarkable. Visualized portions of the mastoid air cells are unremarkable. Other: None. CT  CERVICAL SPINE FINDINGS Alignment: Normal. Skull base and vertebrae: No acute fracture. No primary bone lesion or focal pathologic process. Soft tissues and spinal canal: No prevertebral fluid or swelling. No visible canal hematoma. Disc levels: Degenerative disease with disc height loss at C6-7 with a broad-based disc osteophyte complex. Moderate bilateral facet arthropathy at C2-3. Severe bilateral facet arthropathy at C3-4. Severe bilateral facet arthropathy at C4-5 with bilateral foraminal narrowing. Severe bilateral facet arthropathy at C5-6. At C6-7 there is mild bilateral facet arthropathy, bilateral uncovertebral degenerative changes and bilateral foraminal stenosis. Upper chest: Lung apices are clear.  Centrilobular emphysema. Other: No fluid collection or hematoma. IMPRESSION: 1. No acute intracranial pathology. 2.  No acute osseous injury of the cervical spine. 3.  Emphysema (ICD10-J43.9). Electronically Signed   By: Kathreen Devoid M.D.   On: 04/07/2021 16:27   CT Cervical Spine Wo Contrast  Result Date: 04/07/2021 CLINICAL DATA:  Status post fall, neck trauma EXAM: CT HEAD WITHOUT CONTRAST CT CERVICAL SPINE WITHOUT CONTRAST TECHNIQUE: Multidetector CT imaging of the head and cervical spine was performed following the standard protocol without intravenous contrast. Multiplanar CT image reconstructions of the cervical spine were also generated. RADIATION DOSE REDUCTION: This exam was performed according to the departmental dose-optimization program which includes automated exposure control, adjustment of the mA and/or kV according to patient size and/or use of iterative reconstruction technique. COMPARISON:  None. FINDINGS: Brain: No evidence of acute infarction, hemorrhage, extra-axial collection, ventriculomegaly, or mass effect. Generalized cerebral atrophy. Periventricular white matter low attenuation likely secondary to microangiopathy. Vascular: Cerebrovascular atherosclerotic calcifications are  noted. No hyperdense vessels. Skull: Negative for fracture or focal lesion. Sinuses/Orbits: Visualized portions of the orbits are unremarkable. Visualized portions of the paranasal sinuses are unremarkable. Visualized portions of the mastoid air cells are unremarkable. Other: None. CT CERVICAL SPINE FINDINGS Alignment: Normal. Skull base and vertebrae: No acute fracture. No primary bone lesion or focal pathologic process. Soft tissues and spinal canal: No prevertebral fluid or swelling. No visible canal hematoma. Disc levels: Degenerative disease with disc height loss at C6-7 with a broad-based disc osteophyte complex. Moderate bilateral facet arthropathy at C2-3. Severe bilateral facet arthropathy at C3-4. Severe bilateral facet arthropathy at C4-5 with bilateral foraminal narrowing. Severe  bilateral facet arthropathy at C5-6. At C6-7 there is mild bilateral facet arthropathy, bilateral uncovertebral degenerative changes and bilateral foraminal stenosis. Upper chest: Lung apices are clear.  Centrilobular emphysema. Other: No fluid collection or hematoma. IMPRESSION: 1. No acute intracranial pathology. 2.  No acute osseous injury of the cervical spine. 3.  Emphysema (ICD10-J43.9). Electronically Signed   By: Kathreen Devoid M.D.   On: 04/07/2021 16:27   DG Knee Complete 4 Views Right  Result Date: 04/07/2021 CLINICAL DATA:  Found down after fall yesterday, right hip pain EXAM: RIGHT KNEE - COMPLETE 4+ VIEW COMPARISON:  None. FINDINGS: Frontal, bilateral oblique, lateral views of the right knee are obtained. No acute fracture, subluxation, or dislocation. Bones are diffusely osteopenic. There is 3 compartmental osteoarthritis, with moderate joint space narrowing and osteophyte formation most pronounced in the lateral compartment. Small joint effusion likely reactive. Diffuse vascular calcifications. Soft tissues are otherwise unremarkable. IMPRESSION: 1. Moderate 3 compartmental osteoarthritis greatest in the  lateral compartment. 2. Osteopenia. 3. Small joint effusion. 4. No acute fracture. Electronically Signed   By: Randa Ngo M.D.   On: 04/07/2021 16:43   DG Hip Unilat W or Wo Pelvis 2-3 Views Right  Result Date: 04/07/2021 CLINICAL DATA:  Found down after fall yesterday, right-sided hip pain EXAM: DG HIP (WITH OR WITHOUT PELVIS) 2-3V RIGHT COMPARISON:  None. FINDINGS: Frontal view of the pelvis as well as frontal and frogleg lateral views of the right hip are obtained. There are comminuted displaced fractures through the right superior and inferior pubic rami, with moderate displacement. No other acute displaced fractures. Symmetrical bilateral hip osteoarthritis. Sacroiliac joints are unremarkable. Mild spondylosis and facet hypertrophy at the lumbosacral junction. IMPRESSION: 1. Comminuted moderately displaced right superior and inferior pubic rami fractures. 2. Bilateral hip osteoarthritis. 3. Lower lumbar spondylosis and facet hypertrophy. Electronically Signed   By: Randa Ngo M.D.   On: 04/07/2021 16:42    Procedures Procedures    Medications Ordered in ED Medications - No data to display  ED Course/ Medical Decision Making/ A&P                           Medical Decision Making Amount and/or Complexity of Data Reviewed Labs: ordered. Radiology: ordered.   This patient presents to the ED for concern of fall, this involves an extensive number of treatment options, and is a complaint that carries with it a high risk of complications and morbidity.    Co morbidities that complicate the patient evaluation  CHF, afib   Additional history obtained:  Additional history obtained from friend present in the room    Lab Tests:  I Ordered, and personally interpreted labs.  The pertinent results include: No leukocytosis or anemia.  No electrolyte abnormalities or changes in kidney function.   Imaging Studies ordered:  I ordered imaging studies including CT head without  contrast, CT cervical spine without contrast. DG right hip and right knee   I independently visualized and interpreted imaging which showed  CT head and neck No acute findings DG right hip and knee Comminuted moderately displaced right superior and inferior pubic rami fractures. No acute findings on DG knee I agree with the radiologist interpretation   Cardiac Monitoring:  The patient was maintained on a cardiac monitor.  I personally viewed and interpreted the cardiac monitored which showed an underlying rhythm of: AV dual paced EKG, no STEMI   Medicines ordered and prescription drug management:  I  ordered medication including fentanyl  for pain  Reevaluation of the patient after these medicines showed that the patient improved I have reviewed the patients home medicines and have made adjustments as needed    Reevaluation:  After the interventions noted above, I reevaluated the patient and found that they have :improved   Dispostion:  After consideration of the diagnostic results and the patients response to treatment, I feel that the patent would benefit from inpatient admission for pain control and PT/OT eval with new pelvis fractures.   Patient presents today status post falls.  First fall was yesterday when she slipped in the grass she subsequently fell out of bed this morning.  She has right-sided pelvic fractures, suspect that her injury occurred yesterday and she has been subsequently falling due to pain.  Her friend bought her a walker but she is been attempting to ambulate with throughout her house with significant pain and minimal success.  As previously stated, patient lives alone and is anticoagulated with Xarelto and has steps that she has to go up to get into her house.  I have personally ambulated her in the ED this evening and she is very unsteady on her feet even with a walker and 2 assists and is complaining of associated pain with ambulation.  Given this, I feel  it is best that she be admitted to the hospital for pain control and PT/OT evaluation and establishment of resources to assist her further at home to prevent future falls.  I have discussed this with the patient who is understanding and amenable with plan.  Discussed patient with hospitalist who agrees to admit.   This is a shared visit with supervising physician Dr. Ralene Bathe who has independently evaluated patient & provided guidance in evaluation/management/disposition, in agreement with care    Final Clinical Impression(s) / ED Diagnoses Final diagnoses:  Fall, initial encounter  Other closed fracture of right pubis, initial encounter [facility])    Rx / DC Orders ED Discharge Orders     None         Nestor Lewandowsky 04/07/21 1940    Quintella Reichert, MD 04/08/21 0003

## 2021-04-07 NOTE — ED Notes (Signed)
Patient transported to CT 

## 2021-04-07 NOTE — ED Notes (Signed)
RN and NT ambulated pt w/ walker, pt c/o of pain in R hip w/ ambulation, pt placing all of her weight on her hands when stepping forward w/ R foot,. Pt was unable to properly use walker, requiring 2+ assisted

## 2021-04-07 NOTE — ED Triage Notes (Signed)
Pt here w [facility] after being found on the floor by neighbor. Per pt she had a fall yesterday and was having R hip pain, she was up all night due to pain, then fell while trying to get out of bed sometime this morning. Pt's neighbor found her laying on carpet in her bedroom. Pt reports pain to neck and R hip, worse w/ movement & standing, no shortening or rotation. 80HR, 16RR, 156/70, CBG 130

## 2021-04-08 ENCOUNTER — Observation Stay (HOSPITAL_COMMUNITY): Payer: Medicare Other

## 2021-04-08 ENCOUNTER — Encounter (HOSPITAL_COMMUNITY): Payer: Self-pay | Admitting: Internal Medicine

## 2021-04-08 DIAGNOSIS — E039 Hypothyroidism, unspecified: Secondary | ICD-10-CM | POA: Diagnosis not present

## 2021-04-08 DIAGNOSIS — I5022 Chronic systolic (congestive) heart failure: Secondary | ICD-10-CM | POA: Diagnosis not present

## 2021-04-08 DIAGNOSIS — I4819 Other persistent atrial fibrillation: Secondary | ICD-10-CM | POA: Diagnosis not present

## 2021-04-08 DIAGNOSIS — S32511A Fracture of superior rim of right pubis, initial encounter for closed fracture: Secondary | ICD-10-CM | POA: Diagnosis not present

## 2021-04-08 DIAGNOSIS — S3210XA Unspecified fracture of sacrum, initial encounter for closed fracture: Secondary | ICD-10-CM | POA: Diagnosis not present

## 2021-04-08 DIAGNOSIS — S32599D Other specified fracture of unspecified pubis, subsequent encounter for fracture with routine healing: Secondary | ICD-10-CM | POA: Diagnosis not present

## 2021-04-08 DIAGNOSIS — W19XXXA Unspecified fall, initial encounter: Secondary | ICD-10-CM

## 2021-04-08 DIAGNOSIS — K4090 Unilateral inguinal hernia, without obstruction or gangrene, not specified as recurrent: Secondary | ICD-10-CM | POA: Diagnosis not present

## 2021-04-08 DIAGNOSIS — I10 Essential (primary) hypertension: Secondary | ICD-10-CM | POA: Diagnosis not present

## 2021-04-08 DIAGNOSIS — Y92009 Unspecified place in unspecified non-institutional (private) residence as the place of occurrence of the external cause: Secondary | ICD-10-CM | POA: Diagnosis not present

## 2021-04-08 DIAGNOSIS — K5730 Diverticulosis of large intestine without perforation or abscess without bleeding: Secondary | ICD-10-CM | POA: Diagnosis not present

## 2021-04-08 LAB — CBC WITH DIFFERENTIAL/PLATELET
Abs Immature Granulocytes: 0.04 10*3/uL (ref 0.00–0.07)
Basophils Absolute: 0 10*3/uL (ref 0.0–0.1)
Basophils Relative: 0 %
Eosinophils Absolute: 0 10*3/uL (ref 0.0–0.5)
Eosinophils Relative: 0 %
HCT: 32.3 % — ABNORMAL LOW (ref 36.0–46.0)
Hemoglobin: 10.9 g/dL — ABNORMAL LOW (ref 12.0–15.0)
Immature Granulocytes: 0 %
Lymphocytes Relative: 8 %
Lymphs Abs: 0.8 10*3/uL (ref 0.7–4.0)
MCH: 31.8 pg (ref 26.0–34.0)
MCHC: 33.7 g/dL (ref 30.0–36.0)
MCV: 94.2 fL (ref 80.0–100.0)
Monocytes Absolute: 1.1 10*3/uL — ABNORMAL HIGH (ref 0.1–1.0)
Monocytes Relative: 12 %
Neutro Abs: 7.5 10*3/uL (ref 1.7–7.7)
Neutrophils Relative %: 80 %
Platelets: 146 10*3/uL — ABNORMAL LOW (ref 150–400)
RBC: 3.43 MIL/uL — ABNORMAL LOW (ref 3.87–5.11)
RDW: 13.2 % (ref 11.5–15.5)
WBC: 9.5 10*3/uL (ref 4.0–10.5)
nRBC: 0 % (ref 0.0–0.2)

## 2021-04-08 LAB — COMPREHENSIVE METABOLIC PANEL
ALT: 25 U/L (ref 0–44)
AST: 58 U/L — ABNORMAL HIGH (ref 15–41)
Albumin: 3.1 g/dL — ABNORMAL LOW (ref 3.5–5.0)
Alkaline Phosphatase: 58 U/L (ref 38–126)
Anion gap: 9 (ref 5–15)
BUN: 20 mg/dL (ref 8–23)
CO2: 21 mmol/L — ABNORMAL LOW (ref 22–32)
Calcium: 9 mg/dL (ref 8.9–10.3)
Chloride: 112 mmol/L — ABNORMAL HIGH (ref 98–111)
Creatinine, Ser: 0.99 mg/dL (ref 0.44–1.00)
GFR, Estimated: 56 mL/min — ABNORMAL LOW (ref >60)
Glucose, Bld: 113 mg/dL — ABNORMAL HIGH (ref 70–99)
Potassium: 3.5 mmol/L (ref 3.5–5.1)
Sodium: 142 mmol/L (ref 135–145)
Total Bilirubin: 1.1 mg/dL (ref 0.3–1.2)
Total Protein: 5.3 g/dL — ABNORMAL LOW (ref 6.5–8.1)

## 2021-04-08 LAB — VITAMIN D 25 HYDROXY (VIT D DEFICIENCY, FRACTURES): Vit D, 25-Hydroxy: 27.45 ng/mL — ABNORMAL LOW (ref 30–100)

## 2021-04-08 LAB — MAGNESIUM: Magnesium: 2 mg/dL (ref 1.7–2.4)

## 2021-04-08 MED ORDER — LIDOCAINE 5 % EX PTCH
1.0000 | MEDICATED_PATCH | CUTANEOUS | Status: DC
  Administered 2021-04-08 – 2021-04-10 (×3): 1 via TRANSDERMAL
  Filled 2021-04-08 (×3): qty 1

## 2021-04-08 MED ORDER — RIVAROXABAN 15 MG PO TABS
15.0000 mg | ORAL_TABLET | Freq: Every day | ORAL | Status: DC
  Administered 2021-04-08 – 2021-04-10 (×3): 15 mg via ORAL
  Filled 2021-04-08 (×4): qty 1

## 2021-04-08 MED ORDER — HYDROCODONE-ACETAMINOPHEN 5-325 MG PO TABS: 1.0000 | ORAL_TABLET | Freq: Four times a day (QID) | ORAL | Status: DC | PRN

## 2021-04-08 MED ORDER — METOPROLOL SUCCINATE ER 25 MG PO TB24
25.0000 mg | ORAL_TABLET | Freq: Every day | ORAL | Status: DC
  Administered 2021-04-08 – 2021-04-10 (×3): 25 mg via ORAL
  Filled 2021-04-08 (×3): qty 1

## 2021-04-08 MED ORDER — FUROSEMIDE 20 MG PO TABS
20.0000 mg | ORAL_TABLET | Freq: Every day | ORAL | Status: DC
  Administered 2021-04-08 – 2021-04-10 (×3): 20 mg via ORAL
  Filled 2021-04-08 (×3): qty 1

## 2021-04-08 MED ORDER — LEVOTHYROXINE SODIUM 100 MCG PO TABS
100.0000 ug | ORAL_TABLET | Freq: Every day | ORAL | Status: DC
  Administered 2021-04-08 – 2021-04-10 (×3): 100 ug via ORAL
  Filled 2021-04-08 (×3): qty 1

## 2021-04-08 MED ORDER — AMIODARONE HCL 200 MG PO TABS
100.0000 mg | ORAL_TABLET | Freq: Every day | ORAL | Status: DC
  Administered 2021-04-08 – 2021-04-10 (×3): 100 mg via ORAL
  Filled 2021-04-08 (×3): qty 1

## 2021-04-08 MED ORDER — LOSARTAN POTASSIUM 25 MG PO TABS
25.0000 mg | ORAL_TABLET | Freq: Every day | ORAL | Status: DC
  Administered 2021-04-08 – 2021-04-10 (×3): 25 mg via ORAL
  Filled 2021-04-08 (×3): qty 1

## 2021-04-08 MED ORDER — LORATADINE 10 MG PO TABS
10.0000 mg | ORAL_TABLET | Freq: Every day | ORAL | Status: DC
  Administered 2021-04-08 – 2021-04-10 (×3): 10 mg via ORAL
  Filled 2021-04-08 (×3): qty 1

## 2021-04-08 NOTE — Evaluation (Signed)
Occupational Therapy Evaluation Patient Details Name: [name] MRN: [id number] DOB: Jan 24, 1933 Today's Date: 04/08/2021   History of Present Illness Pt is a 88 y/o female presenting on 1/31 with R posterior hip pain after ground level fall.  Found with commuted moderately displaced R superior and inferior pubic rami fractures. PMH includes: AAA, anxiety, CHF, HTN, pacemaker, PNA.   Clinical Impression   Pt admitted for above and limited by problem list below, including pain, weakness, impaired cognition, impaired balance and decreased activity tolerance.  Patient currently requires min assist +2 for sit to stand, max assist +2 for limited mobility using RW, and up to total assist +2 for ADLs.  Patient oriented to self and intermittently to place, demonstrating decreased awareness, problem solving and safety- she is pleasantly confused but able to follow simple commands. She is unable to provide PLOF or home setup, but anticipate she was independent. Based on performance today, believe she will benefit from further OT services while admitted and after dc at SNF level to optimize independence, safety and return to PLOF.       Recommendations for follow up therapy are one component of a multi-disciplinary discharge planning process, led by the attending physician.  Recommendations may be updated based on patient status, additional functional criteria and insurance authorization.   Follow Up Recommendations  Skilled nursing-short term rehab (<3 hours/day)    Assistance Recommended at Discharge Frequent or constant Supervision/Assistance  Patient can return home with the following Two people to help with walking and/or transfers;A lot of help with bathing/dressing/bathroom;Assistance with cooking/housework;Direct supervision/assist for medications management;Direct supervision/assist for financial management;Assist for transportation;Help with stairs or ramp for entrance    Functional Status  Assessment  Patient has had a recent decline in their functional status and demonstrates the ability to make significant improvements in function in a reasonable and predictable amount of time.  Equipment Recommendations  BSC/3in1;Other (comment) (RW)    Recommendations for Other Services       Precautions / Restrictions Precautions Precautions: Fall Restrictions Weight Bearing Restrictions: No      Mobility Bed Mobility Overal bed mobility: Needs Assistance Bed Mobility: Supine to Sit, Sit to Supine     Supine to sit: Mod assist Sit to supine: Max assist, +2 for physical assistance, +2 for safety/equipment   General bed mobility comments: mod assist for LB initation and trunk support to ascend, returned to supine with support of BLEs and guiding support of trunk.  cueing for technqiue and increased time    Transfers                          Balance Overall balance assessment: Needs assistance Sitting-balance support: No upper extremity supported, Feet supported Sitting balance-Leahy Scale: Fair Sitting balance - Comments: able to don L sock with increased time, min guard dynamically   Standing balance support: During functional activity, Bilateral upper extremity supported Standing balance-Leahy Scale: Poor Standing balance comment: relies on BUE and external support                           ADL either performed or assessed with clinical judgement   ADL Overall ADL's : Needs assistance/impaired     Grooming: Min guard;Sitting           Upper Body Dressing : Min guard;Sitting   Lower Body Dressing: Maximal assistance;+2 for safety/equipment;Sit to/from stand   Toilet Transfer: Maximal assistance;+2 for physical  assistance;+2 for safety/equipment;Ambulation;Rolling walker (2 wheels)   Toileting- Clothing Manipulation and Hygiene: Total assistance;+2 for physical assistance;+2 for safety/equipment;Sit to/from stand       Functional  mobility during ADLs: Maximal assistance;+2 for physical assistance;+2 for safety/equipment;Rolling walker (2 wheels);Cueing for safety;Cueing for sequencing General ADL Comments: inital sit to stand min assist +2, once standing difficulty with mobility due to pain and cognition     Vision   Vision Assessment?: No apparent visual deficits     Perception     Praxis      Pertinent Vitals/Pain Pain Assessment Pain Assessment: Faces Faces Pain Scale: Hurts even more Pain Location: R hip Pain Descriptors / Indicators: Discomfort, Grimacing, Guarding Pain Intervention(s): Monitored during session, Limited activity within patient's tolerance, Repositioned     Hand Dominance     Extremity/Trunk Assessment Upper Extremity Assessment Upper Extremity Assessment: Generalized weakness   Lower Extremity Assessment Lower Extremity Assessment: Defer to PT evaluation       Communication Communication Communication: No difficulties   Cognition Arousal/Alertness: Awake/alert Behavior During Therapy: Flat affect, Anxious Overall Cognitive Status: No family/caregiver present to determine baseline cognitive functioning Area of Impairment: Attention, Orientation, Memory, Following commands, Awareness, Safety/judgement, Problem solving                 Orientation Level: Disoriented to, Time, Situation Current Attention Level: Focused Memory: Decreased recall of precautions, Decreased short-term memory Following Commands: Follows one step commands consistently, Follows one step commands with increased time Safety/Judgement: Decreased awareness of safety, Decreased awareness of deficits Awareness: Intellectual Problem Solving: Slow processing, Decreased initiation, Difficulty sequencing, Requires tactile cues, Requires verbal cues General Comments: pt oriented to self and place (intermittently), she follows simple 1 step commands with increased time but poor awareness, safety and problem  solving.     General Comments       Exercises     Shoulder Instructions      Home Living Family/patient expects to be discharged to:: Private residence Living Arrangements: Alone                               Additional Comments: pt unable to provide home setup, from 2015 chart review she lives alone in a 1 level home      Prior Functioning/Environment Prior Level of Function : Independent/Modified Independent;Patient poor historian/Family not available               ADLs Comments: anticipate independent, as pt reports living alone        OT Problem List: Decreased strength;Decreased activity tolerance;Impaired balance (sitting and/or standing);Decreased cognition;Decreased knowledge of precautions;Decreased safety awareness;Decreased knowledge of use of DME or AE;Pain      OT Treatment/Interventions: Self-care/ADL training;Therapeutic exercise;DME and/or AE instruction;Therapeutic activities;Cognitive remediation/compensation;Patient/family education;Balance training    OT Goals(Current goals can be found in the care plan section) Acute Rehab OT Goals Patient Stated Goal: feel better OT Goal Formulation: With patient Time For Goal Achievement: 04/22/21 Potential to Achieve Goals: Good  OT Frequency: Min 2X/week    Co-evaluation PT/OT/SLP Co-Evaluation/Treatment: Yes Reason for Co-Treatment: For patient/therapist safety;To address functional/ADL transfers;Necessary to address cognition/behavior during functional activity   OT goals addressed during session: ADL's and self-care      AM-PAC OT "6 Clicks" Daily Activity     Outcome Measure Help from another person eating meals?: A Little Help from another person taking care of personal grooming?: A Little Help from another person toileting, which includes  using toliet, bedpan, or urinal?: Total Help from another person bathing (including washing, rinsing, drying)?: A Lot Help from another person to  put on and taking off regular upper body clothing?: A Little Help from another person to put on and taking off regular lower body clothing?: A Lot 6 Click Score: 14   End of Session Equipment Utilized During Treatment: Rolling walker (2 wheels);Gait belt Nurse Communication: Mobility status  Activity Tolerance: Patient tolerated treatment well Patient left: in bed;with nursing/sitter in room  OT Visit Diagnosis: Other abnormalities of gait and mobility (R26.89);Muscle weakness (generalized) (M62.81);Pain;Other symptoms and signs involving cognitive function Pain - Right/Left: Right Pain - part of body: Hip (pelvis)                Time: 1110-1130 OT Time Calculation (min): 20 min Charges:  OT General Charges $OT Visit: 1 Visit OT Evaluation $OT Eval Moderate Complexity: 1 Mod  Jolaine Artist, OT Acute Rehabilitation Services Pager [phone] Office [phone]   Delight Stare 04/08/2021, 11:56 AM

## 2021-04-08 NOTE — TOC Initial Note (Signed)
Transition of Care [facility]) - Initial/Assessment Note    Patient Details  Name: [name] MRN: [id number] Date of Birth: [birth year]  Transition of Care [facility]) CM/SW Contact:    Bethann Berkshire, [place] Phone Number: 04/08/2021, 3:19 PM  Clinical Narrative:                  CSW met with pt for SNF workup. CSW explained SNF recommendation and process. Pt is hesitant about SNF. Said she was at [facility] in 2014 and would not want to go back there. She lives alone in a 1st floor condo. She reports she has 3 cousins who live in [place] and that she has good friends who appear to be listed in chart. Despite friends that she mentioned, she doesn't have anyone to care for her at home but indicates that home would still be her preference. She is sad about her fall and expresses hope that she could still return home. CSW pointed out current physical limitations and likely need for much more support that SNF would be able to provide. Pt is very hesitant about SNF but agrees to SNF workup to see what options she may have. She expresses interest in [facility]. She indicates she is not committed to SNF at this time but just willing for workup to be started. Pt is covid vaccinated x4. CSW will complete FL2 and fax bed requests in hub.   Expected Discharge Plan: [place] Barriers to Discharge: Continued Medical Work up   Patient Goals and CMS Choice        Expected Discharge Plan and Services Expected Discharge Plan: [place] arrangements for the past 2 months: Apartment                                      Prior Living Arrangements/Services Living arrangements for the past 2 months: Apartment Lives with:: Self            Care giver support system in place?: No (comment)      Activities of Daily Living      Permission Sought/Granted                  Emotional Assessment Appearance:: Appears stated  age Attitude/Demeanor/Rapport: Other (comment) (Sad) Affect (typically observed): Sad Orientation: : Oriented to Self, Oriented to Place, Oriented to  Time, Oriented to Situation Alcohol / Substance Use: Not Applicable Psych Involvement: No (comment)  Admission diagnosis:  Pubic ramus fracture ([place]) [S32.599A] Fall, initial encounter [W19.XXXA] Other closed fracture of right pubis, initial encounter [facility]) [S32.591A] Patient Active Problem List   Diagnosis Date Noted   Fall at home, initial encounter 32/67/1245   Chronic systolic CHF (congestive heart failure) ([place]) 04/08/2021   Pubic ramus fracture ([place]) 04/07/2021   Moderate episode of recurrent major depressive disorder ([place]) 03/23/2021   Non-pressure chronic ulcer of left ankle with fat layer exposed ([place]) 01/01/2021   Persistent atrial fibrillation (HCC)    Acute on chronic congestive heart failure ([place]) 04/28/2020   Incisional hernia 09/07/2018   Coronary atherosclerosis due to calcified coronary lesion of native artery 12/09/2017   Anticoagulated 11/07/2015   Lumbago 08/20/2015   Kyphosis 08/20/2015   Insomnia 08/20/2015   Aortic atherosclerosis ([place]) 08/20/2015   Abdominal bruit 08/20/2015   Aortic ectasia ([place]) 08/20/2015   AAA (abdominal aortic aneurysm) without rupture  08/20/2015   B12 deficiency 05/21/2015   HTN (hypertension) 11/19/2014   Cardiomyopathy, dilated, nonischemic ([place]) 07/18/2014   Biventricular cardiac pacemaker in situ 04/24/2014   Chronic cough 04/17/2014   Aneurysm of iliac artery ([place]) 04/17/2014   Incisional hernia, without obstruction or gangrene 11/21/2013   Frequency of urination 11/21/2013   Malaise and fatigue 05/08/2013   Raynaud disease 05/08/2013   Moderate malnutrition ([place]) 12/24/2012   Hypothyroidism    Hair thinning    Anxiety disorder    Hyperlipidemia    Sinusitis, chronic    Chronic bronchitis (HCC)    Depression    Restless legs syndrome (RLS)    PVC (premature ventricular  contraction) 06/08/2011   GERD 02/03/2009   Acute on chronic systolic CHF (congestive heart failure) ([place]) 12/12/2008   BARRETT'S ESOPHAGUS 05/29/2008   Gastritis and gastroduodenitis 05/29/2008   PCP:  Wardell Honour, MD Pharmacy:   [id number] [place] [id number]: [phone]: [phone] [facility] [facility] [id number]: [phone]: [phone]    Social Determinants of Health (SDOH) Interventions    Readmission Risk Interventions No flowsheet data found.

## 2021-04-08 NOTE — NC FL2 (Signed)
[place] LEVEL OF CARE SCREENING TOOL     IDENTIFICATION  Patient Name: [name] Birthdate: 1932-11-10 Sex: female Admission Date (Current Location): 04/07/2021  [facility] and [place] Number:  Herbalist and Address:  [facility] [place] [address] [place], [place] [address]      Provider Number: [id number]  Attending Physician Name and Address:  Nolberto Hanlon, MD  Relative Name and Phone Number:  Vanna [place] [phone])   [phone] (Mobile)    Current Level of Care: Hospital Recommended Level of Care: [place] Prior Approval Number:    Date Approved/Denied:   PASRR Number: [id number]  Discharge Plan: SNF    Current Diagnoses: Patient Active Problem List   Diagnosis Date Noted   Fall at home, initial encounter 63/78/5885   Chronic systolic CHF (congestive heart failure) ([place]) 04/08/2021   Pubic ramus fracture ([place]) 04/07/2021   Moderate episode of recurrent major depressive disorder ([place]) 03/23/2021   Non-pressure chronic ulcer of left ankle with fat layer exposed ([place]) 01/01/2021   Persistent atrial fibrillation (HCC)    Acute on chronic congestive heart failure ([place]) 04/28/2020   Incisional hernia 09/07/2018   Coronary atherosclerosis due to calcified coronary lesion of native artery 12/09/2017   Anticoagulated 11/07/2015   Lumbago 08/20/2015   Kyphosis 08/20/2015   Insomnia 08/20/2015   Aortic atherosclerosis ([place]) 08/20/2015   Abdominal bruit 08/20/2015   Aortic ectasia ([place]) 08/20/2015   AAA (abdominal aortic aneurysm) without rupture 08/20/2015   B12 deficiency 05/21/2015   HTN (hypertension) 11/19/2014   Cardiomyopathy, dilated, nonischemic (HCC) 07/18/2014   Biventricular cardiac pacemaker in situ 04/24/2014   Chronic cough 04/17/2014   Aneurysm of iliac artery ([place]) 04/17/2014   Incisional hernia, without obstruction or gangrene 11/21/2013   Frequency of urination 11/21/2013   Malaise  and fatigue 05/08/2013   Raynaud disease 05/08/2013   Moderate malnutrition ([place]) 12/24/2012   Hypothyroidism    Hair thinning    Anxiety disorder    Hyperlipidemia    Sinusitis, chronic    Chronic bronchitis (HCC)    Depression    Restless legs syndrome (RLS)    PVC (premature ventricular contraction) 06/08/2011   GERD 02/03/2009   Acute on chronic systolic CHF (congestive heart failure) ([place]) 12/12/2008   BARRETT'S ESOPHAGUS 05/29/2008   Gastritis and gastroduodenitis 05/29/2008    Orientation RESPIRATION BLADDER Height & Weight     Self, Time, Situation, Place  Normal External catheter Weight:   Height:     BEHAVIORAL SYMPTOMS/MOOD NEUROLOGICAL BOWEL NUTRITION STATUS      Continent Diet (See d/c summary)  AMBULATORY STATUS COMMUNICATION OF NEEDS Skin   Extensive Assist Verbally Normal                       Personal Care Assistance Level of Assistance  Bathing, Feeding, Dressing Bathing Assistance: Maximum assistance Feeding assistance: Independent Dressing Assistance: Maximum assistance     Functional Limitations Info  Sight, Hearing, Speech Sight Info: Adequate Hearing Info: Adequate Speech Info: Adequate    SPECIAL CARE FACTORS FREQUENCY  PT (By licensed PT), OT (By licensed OT)     PT Frequency: 5x/week OT Frequency: 5x/week            Contractures Contractures Info: Not present    Additional Factors Info  Code Status, Allergies Code Status Info: Full code Allergies Info: Cephalexin, hydrocodone, Ciprofloxacin, Doxycycline, Escitalopram, Levofloxacin, Moxifloxacin, Ofloxacin, setraline, Sulfamethoxazole, latex, Neomycin  Current Medications (04/08/2021):  This is the current hospital active medication list Current Facility-Administered Medications  Medication Dose Route Frequency Provider Last Rate Last Admin   acetaminophen (TYLENOL) tablet 650 mg  650 mg Oral Q6H PRN Howerter, Justin B, DO       Or   acetaminophen (TYLENOL)  suppository 650 mg  650 mg Rectal Q6H PRN Howerter, Justin B, DO       amiodarone (PACERONE) tablet 100 mg  100 mg Oral Daily Howerter, Justin B, DO   100 mg at 04/08/21 0932   furosemide (LASIX) tablet 20 mg  20 mg Oral Daily Howerter, Justin B, DO   20 mg at 04/08/21 5056   HYDROcodone-acetaminophen (NORCO/VICODIN) 5-325 MG per tablet 1 tablet  1 tablet Oral Q6H PRN Howerter, Justin B, DO       levothyroxine (SYNTHROID) tablet 100 mcg  100 mcg Oral QAC breakfast Howerter, Justin B, DO   100 mcg at 04/08/21 0635   lidocaine (LIDODERM) 5 % 1 patch  1 patch Transdermal Q24H Nolberto Hanlon, MD       loratadine (CLARITIN) tablet 10 mg  10 mg Oral Daily Howerter, Justin B, DO   10 mg at 04/08/21 0933   losartan (COZAAR) tablet 25 mg  25 mg Oral Daily Howerter, Justin B, DO   25 mg at 04/08/21 9794   metoprolol succinate (TOPROL-XL) 24 hr tablet 25 mg  25 mg Oral Daily Howerter, Justin B, DO   25 mg at 04/08/21 0933   naloxone [facility]) injection 0.4 mg  0.4 mg Intravenous PRN Howerter, Justin B, DO       Rivaroxaban (XARELTO) tablet 15 mg  15 mg Oral Q supper Howerter, Justin B, DO         Discharge Medications: Please see discharge summary for a list of discharge medications.  Relevant Imaging Results:  Relevant Lab Results:   Additional Information SSN [place] [employer], [place]

## 2021-04-08 NOTE — ED Notes (Signed)
Pt sts she wants to go home.  Sts she has friends that can help her.  This writer attempted to explain she is being admitted d/t not being able to ambulate/safely discharge.  Pt informed she would be seen by the doctor this morning and PT/OT.

## 2021-04-08 NOTE — ED Notes (Signed)
Pt noted to be sleeping soundly. NAD noted.

## 2021-04-08 NOTE — TOC CAGE-AID Note (Signed)
Transition of Care [facility]) - CAGE-AID Screening   Patient Details  Name: ROCQUEL ASKREN MRN: [id number] Date of Birth: [birth year]  Transition of Care [facility]) CM/SW Contact:    Skyeler Scalese C Tarpley-Carter, [place] Phone Number: 04/08/2021, 1:20 PM   Clinical Narrative: Pt participated in [place].  Pt stated she does not use substance or ETOH.  Pt was not offered resources, due to no usage of substance or ETOH.     Asael Pann Tarpley-Carter, MSW, LCSW-A Pronouns:  She/Her/[place]ers [place] Transitions of Care Clinical Social Worker Direct Number:  [phone] [email or web address]   CAGE-AID Screening:    Have You Ever Felt You Ought to Cut Down on Your Drinking or Drug Use?: No Have People Annoyed You By [employer] Your Drinking Or Drug Use?: No Have You Felt Bad Or Guilty About Your Drinking Or Drug Use?: No Have You Ever Had a Drink or Used Drugs First Thing In The Morning to Steady Your Nerves or to Get Rid of a Hangover?: No CAGE-AID Score: 0  Substance Abuse Education Offered: No

## 2021-04-08 NOTE — ED Notes (Addendum)
Pt repositioned in bed and assisted w/ meal tray.  Pt will not flex neck toward chest/look down.  Pt reports it is painful.

## 2021-04-08 NOTE — Evaluation (Signed)
Physical Therapy Evaluation Patient Details Name: [name] MRN: [id number] DOB: May 09, 1932 Today's Date: 04/08/2021  History of Present Illness  Pt is a 88 y/o female presenting on 1/31 with R posterior hip pain after ground level fall.  Found with commuted moderately displaced R superior and inferior pubic rami fractures. PMH includes: AAA, anxiety, CHF, HTN, pacemaker, PNA.  Clinical Impression  Pt admitted with above diagnosis. Pt was able to ambulate a few steps with +2 mod assist. Pt limited by pain on right LE and confusion needing mod assist to progress steps. Pt will need 24 hour care on d/c and recommend SNF for rehab. If confusion clears, may eventually be able to go back to home.  Pt currently with functional limitations due to the deficits listed below (see PT Problem List). Pt will benefit from skilled PT to increase their independence and safety with mobility to allow discharge to the venue listed below.          Recommendations for follow up therapy are one component of a multi-disciplinary discharge planning process, led by the attending physician.  Recommendations may be updated based on patient status, additional functional criteria and insurance authorization.  Follow Up Recommendations Skilled nursing-short term rehab (<3 hours/day)    Assistance Recommended at Discharge Frequent or constant Supervision/Assistance  Patient can return home with the following  Two people to help with walking and/or transfers;Two people to help with bathing/dressing/bathroom    Equipment Recommendations Other (comment) (TBA)  Recommendations for Other Services       Functional Status Assessment Patient has had a recent decline in their functional status and demonstrates the ability to make significant improvements in function in a reasonable and predictable amount of time.     Precautions / Restrictions Precautions Precautions: Fall Restrictions Weight Bearing Restrictions: No       Mobility  Bed Mobility Overal bed mobility: Needs Assistance Bed Mobility: Supine to Sit, Sit to Supine     Supine to sit: Mod assist Sit to supine: Max assist, +2 for physical assistance, +2 for safety/equipment   General bed mobility comments: mod assist for LB initation and trunk support to ascend, returned to supine with support of BLEs and guiding support of trunk.  cueing for technqiue and increased time    Transfers Overall transfer level: Needs assistance Equipment used: Rolling walker (2 wheels) Transfers: Sit to/from Stand Sit to Stand: Mod assist, +2 physical assistance, From elevated surface           General transfer comment: Needed mod assist to power up with cues for hand placement as well. Pt confused and not reaching for RW and needed cues to use UEs for support to unweight the right LE due to pain. Pt confused in general and not understanding.    Ambulation/Gait Ambulation/Gait assistance: Mod assist, +2 safety/equipment Gait Distance (Feet): 5 Feet Assistive device: Rolling walker (2 wheels) Gait Pattern/deviations: Step-to pattern, Decreased stride length, Decreased step length - right, Decreased step length - left, Decreased stance time - right, Decreased weight shift to right, Knee flexed in stance - right, Knee flexed in stance - left, Shuffle, Antalgic, Staggering left, Staggering right, Trunk flexed   Gait velocity interpretation: <1.31 ft/sec, indicative of household ambulator   General Gait Details: Pt was soaked with urine on arrival. Once pt stood, cleaned pt and intiialy was going to have pt walk into the bathroom. Pt started walking but after a few steps, stated she couldnt.  Pt was able to turn  around and walk back to strecher with incr time, incr assist and incr cues. Needed physical assist to weight shift to left to unweight right hip take steps. Pt took incr time to ambulate 5 feet.  Stairs            Wheelchair Mobility    Modified  Rankin (Stroke Patients Only)       Balance Overall balance assessment: Needs assistance Sitting-balance support: No upper extremity supported, Feet supported Sitting balance-Leahy Scale: Fair Sitting balance - Comments: able to don L sock with increased time, min guard dynamically   Standing balance support: During functional activity, Bilateral upper extremity supported Standing balance-Leahy Scale: Poor Standing balance comment: relies on BUE and external support                             Pertinent Vitals/Pain Pain Assessment Pain Assessment: Faces Faces Pain Scale: Hurts even more Pain Location: R hip Pain Descriptors / Indicators: Discomfort, Grimacing, Guarding Pain Intervention(s): Limited activity within patient's tolerance, Monitored during session, Repositioned    Home Living Family/patient expects to be discharged to:: Private residence Living Arrangements: Alone                 Additional Comments: pt unable to provide home setup, from 2015 chart review she lives alone in a 1 level home    Prior Function Prior Level of Function : Independent/Modified Independent;Patient poor historian/Family not available               ADLs Comments: anticipate independent, as pt reports living alone     Hand Dominance        Extremity/Trunk Assessment   Upper Extremity Assessment Upper Extremity Assessment: Defer to OT evaluation    Lower Extremity Assessment Lower Extremity Assessment: RLE deficits/detail RLE Deficits / Details: grossly 2+/5 RLE: Unable to fully assess due to pain    Cervical / Trunk Assessment Cervical / Trunk Assessment: Kyphotic  Communication   Communication: No difficulties  Cognition Arousal/Alertness: Awake/alert Behavior During Therapy: Flat affect, Anxious Overall Cognitive Status: No family/caregiver present to determine baseline cognitive functioning Area of Impairment: Attention, Orientation, Memory,  Following commands, Awareness, Safety/judgement, Problem solving                 Orientation Level: Disoriented to, Time, Situation Current Attention Level: Focused Memory: Decreased recall of precautions, Decreased short-term memory Following Commands: Follows one step commands consistently, Follows one step commands with increased time Safety/Judgement: Decreased awareness of safety, Decreased awareness of deficits Awareness: Intellectual Problem Solving: Slow processing, Decreased initiation, Difficulty sequencing, Requires tactile cues, Requires verbal cues General Comments: pt oriented to self and place (intermittently), she follows simple 1 step commands with increased time but poor awareness, safety and problem solving.        General Comments General comments (skin integrity, edema, etc.): VSS    Exercises     Assessment/Plan    PT Assessment Patient needs continued PT services  PT Problem List Decreased activity tolerance;Decreased balance;Decreased strength;Decreased range of motion;Decreased mobility;Decreased knowledge of use of DME;Decreased safety awareness;Decreased knowledge of precautions;Pain       PT Treatment Interventions DME instruction;Gait training;Functional mobility training;Therapeutic activities;Therapeutic exercise;Balance training;Patient/family education    PT Goals (Current goals can be found in the Care Plan section)  Acute Rehab PT Goals Patient Stated Goal: pt unable to state PT Goal Formulation: With patient Time For Goal Achievement: 04/22/21 Potential to Achieve Goals: Fair  Frequency Min 3X/week     Co-evaluation PT/OT/SLP Co-Evaluation/Treatment: Yes Reason for Co-Treatment: Complexity of the patient's impairments (multi-system involvement);For patient/therapist safety PT goals addressed during session: Mobility/safety with mobility OT goals addressed during session: ADL's and self-care       AM-PAC PT "6 Clicks"  Mobility  Outcome Measure Help needed turning from your back to your side while in a flat bed without using bedrails?: Total Help needed moving from lying on your back to sitting on the side of a flat bed without using bedrails?: Total Help needed moving to and from a bed to a chair (including a wheelchair)?: Total Help needed standing up from a chair using your arms (e.g., wheelchair or bedside chair)?: Total Help needed to walk in hospital room?: Total Help needed climbing 3-5 steps with a railing? : Total 6 Click Score: 6    End of Session Equipment Utilized During Treatment: Gait belt Activity Tolerance: Patient limited by fatigue;Patient limited by pain Patient left: with call bell/phone within reach (on strecher) Nurse Communication: Mobility status PT Visit Diagnosis: Unsteadiness on feet (R26.81);Muscle weakness (generalized) (M62.81);Pain Pain - Right/Left: Right Pain - part of body: Leg    Time: 1113-1130 PT Time Calculation (min) (ACUTE ONLY): 17 min   Charges:   PT Evaluation $PT Eval Moderate Complexity: 1 Mod          Marlee Armenteros M,PT Acute Rehab Services [phone] (pager)   Alvira Philips 04/08/2021, 1:55 PM

## 2021-04-08 NOTE — H&P (Signed)
History and Physical    PLEASE NOTE THAT DRAGON DICTATION SOFTWARE WAS USED IN THE CONSTRUCTION OF THIS NOTE.   [name] ZOX:[id number] DOB: 06/06/1932 DOA: 04/07/2021  PCP: Wardell Honour, MD  Patient coming from: home   I have personally briefly reviewed patient's old medical records in [place]  Chief Complaint: Right posterior hip pain  HPI: [name] is a 88 y.o. female with medical history significant for paroxysmal atrial fibrillation chronically anticoagulated on Xarelto complicated by sick sinus syndrome status post pacemaker placement, chronic systolic heart failure in the setting of suspected ischemic cardiomyopathy with LVEF 45 to 50%, essential hypertension, acquired hypothyroidism, who is admitted to [facility] on 04/07/2021 with commuted moderately displaced right superior and inferior pubic rami fractures after presenting from home to [facility] ED complaining of right posterior hip pain.   The patient reports that she experienced a ground-level fall yesterday, 04/06/2021, when she tripped while ambulating at home resulting in a fall towards her right side, in which her right knee as well as the right hip were the principal point of contacts with the floor below.  As a result of this fall, the patient reports immediate development of sharp right posterior hip pain without radiation.  Pain has been constant since onset, with exacerbation when attempting to ambulate.  This is relative to the patient's baseline functional status in which she lives independently as well as baseline ambulatory status in which she is able to ambulate without any assistance, including no need for support devices.  She also reports mild right knee pain as a consequence of aforementioned ground-level mechanical fall yesterday.  Denies any acute numbness or paresthesias involving the bilateral lower extremities.  Fall was unwitnessed in nature, although the patient reports that she did not  hit her head as a component of the fall, and denies any associated loss of consciousness.  Denies any preceding or associated chest pain, shortness of breath, diaphoresis, palpitations, nausea, vomiting, dizziness, presyncope, or syncope.  Denies any subsequent headache, neck pain, blurry vision, or diplopia.  In the setting of paroxysmal atrial fibrillation, she is chronically anticoagulated on Xarelto, with most recent dose occurring earlier today.  Not on any additional blood thinners, including no aspirin.  Denies any recent subjective fever, chills, rigors, or generalized myalgias.  She also denies any recent dysuria, gross hematuria, or change in urinary urgency/frequency.     ED Course:  Vital signs in the ED were notable for the following: Afebrile; heart rate 67-74; blood pressure 122/75; respiratory rate 15-22; oxygen saturation 94 to 97% on room air.  Labs were notable for the following: CMP notable for the following: Sodium 143, creatinine 1.10.  CBC notable for white blood cell count 9700, hemoglobin 11.4 relative to most recent prior hemoglobin did wound of 11.8 in October 2022, platelet count 157.  Urinalysis was ordered, the result currently pending.  COVID-19/influenza PCR negative.  Imaging and additional notable ED work-up: Noncontrast CT of the head shows no evidence of acute intracranial process, including no evidence of intracranial hemorrhage.  CT cervical spine shows no evidence of acute osseous injury of the cervical spine.  Plain films of the right knee, 4 views, show no evidence of acute fracture.  Plain films of the right hip/pelvis shows comminuted moderately displaced right superior and inferior pubic rami fractures.  While in the ED, the following were administered: Fentanyl 25 mcg IV x1.  Subsequently, the patient is admitted for overnight observation to [place]  for further evaluation and management of right superior and inferior pubic rami fractures, including pain  control, early ambulation, and physical therapy/Occupational Therapy consultations.     Review of Systems: As per HPI otherwise 10 point review of systems negative.   Past Medical History:  Diagnosis Date   AAA (abdominal aortic aneurysm)    2.8 cm 05/2018; 5 year [place] per ACR guidelines   Allergy    all year    Anxiety disorder    Arthritis    Barrett's esophagus 04/2008   Benign neoplasm of colon    Blood transfusion without reported diagnosis    long ago per pt   Cataract    removed both eyes   CHF (congestive heart failure) ([place])    Chronic airway obstruction, not elsewhere classified    Complication of anesthesia    slow to wake up   Depressive disorder, not elsewhere classified    Diverticulitis    Dysthymic disorder    Erosive gastritis 08/2004   GERD (gastroesophageal reflux disease)    Hair thinning    HTN (hypertension)    pt denies    Hyperplastic polyps of stomach 06/2002   Hypothyroidism    ICD (implantable cardiac defibrillator) in place    BiV/ICD; s/p removal of ICD with insertion of BiV PPM 04/26/12   Incisional hernia    Insomnia 08/20/2015   Kyphosis 08/20/2015   LBBB (left bundle branch block)    Melanoma (HCC)    right arm   Mitral regurgitation    Nonischemic cardiomyopathy ([place])    EF initially 20%; Last measurement up to 50% per echo in August of 2012   Osteopenia    Other and unspecified hyperlipidemia    Other and unspecified hyperlipidemia    Pacemaker 04/26/2012   PAF (paroxysmal atrial fibrillation) ([place])    11/2016   Pain in joint, lower leg    Palpitations    Pneumonia    Restless legs syndrome (RLS)    Synovial cyst of popliteal space    Unspecified chronic bronchitis (HCC)    Unspecified nasal polyp    Unspecified sinusitis (chronic)     Past Surgical History:  Procedure Laterality Date   ABDOMINAL HYSTERECTOMY  1076   endometriosis   APPENDECTOMY     BI-VENTRICULAR PACEMAKER INSERTION N/A 04/26/2012   Procedure: BI-VENTRICULAR  PACEMAKER INSERTION (CRT-P);  Surgeon: Evans Lance, MD;  Location: [facility] CATH LAB;  Service: Cardiovascular;  Laterality: N/A;   BIV PACEMAKER GENERATOR CHANGEOUT N/A 01/26/2019   Procedure: BIV PACEMAKER GENERATOR CHANGEOUT;  Surgeon: Evans Lance, MD;  Location: [place] CV LAB;  Service: Cardiovascular;  Laterality: N/A;   CARDIOVERSION N/A 05/06/2020   Procedure: CARDIOVERSION;  Surgeon: Pixie Casino, MD;  Location: [place];  Service: Cardiovascular;  Laterality: N/A;   CARDIOVERSION N/A 05/19/2020   Procedure: CARDIOVERSION;  Surgeon: Jerline Pain, MD;  Location: [facility] ENDOSCOPY;  Service: Cardiovascular;  Laterality: N/A;   COLONOSCOPY     defibrillator insertion     s/p removal of previously implanted BiV ICD and insertion of a new BiV pacemaker on 04/26/12   excise mole of lip  2001   Dr. Larena Sox   excision of wen  1984   Dr. Parks Ranger   FEMORAL HERNIA REPAIR  12/25/2012   Dr Dalbert Batman   FEMORAL HERNIA REPAIR  01/04/2013   Recurrent - Dr. Nash Mantis HERNIA REPAIR N/A 10/01/2013   Procedure: LAPAROSCOPIC INCISIONAL HERNIA;  Surgeon: Gayland Curry, MD;  Location: [facility] ORS;  Service: General;  Laterality: N/A;   INCISIONAL HERNIA REPAIR N/A 09/07/2018   Procedure: LAPAROSCOPIC ASSISTED REPAIR OF INCISIONAL HERNIA;  Surgeon: Greer Pickerel, MD;  Location: [place];  Service: General;  Laterality: N/A;   INGUINAL HERNIA REPAIR Right 09/26/2012   Procedure: RIGHT INGUINAL HERNIA REPAIR WITH MESH;  Surgeon: Odis Hollingshead, MD;  Location: [place];  Service: General;  Laterality: Right;   INGUINAL HERNIA REPAIR Right 12/25/2012   Procedure: explor right groin, small bowel rescection, tissue repair right femoral hernia;  Surgeon: Adin Hector, MD;  Location: [facility];  Service: General;  Laterality: Right;   INSERTION OF MESH Right 09/26/2012   Procedure: INSERTION OF MESH;  Surgeon: Odis Hollingshead, MD;  Location: [place];  Service: General;  Laterality: Right;   INSERTION OF MESH  N/A 10/01/2013   Procedure: INSERTION OF MESH;  Surgeon: Gayland Curry, MD;  Location: [facility];  Service: General;  Laterality: N/A;   LAPAROSCOPY N/A 01/04/2013   Procedure: Diagnostic Laparoscopy, exploratory laparotomy with small bowel resection, closure of right femoral hernia repair;  Surgeon: Madilyn Hook, DO;  Location: [facility];  Service: General;  Laterality: N/A;   Left arm surgery     POLYPECTOMY     RIGHT BREAST LUMPECTOMY  1988   Dr. Marylene Buerger   right knee surgery     arthroscopy: Dr. Shellia Carwin   TONSILLECTOMY     UPPER GASTROINTESTINAL ENDOSCOPY      Social History:  reports that she quit smoking about 27 years ago. Her smoking use included cigarettes. She has a 15.00 pack-year smoking history. She has never used smokeless tobacco. She reports that she does not drink alcohol and does not use drugs.   Allergies  Allergen Reactions   Hydrocodone Itching   Cephalexin Itching and Swelling   Ciprofloxacin Other (See Comments)    Not indicated due to aneurysm in aorta     Doxycycline Other (See Comments)    Unknown reaction   Escitalopram Other (See Comments)    Dry mouth   Levofloxacin Other (See Comments)    Not indicated due to aneurysm in aorta    Moxifloxacin Other (See Comments)    Not indicated due to aneurysm in aorta    Ofloxacin Other (See Comments)    Unknown reaction   Sertraline Other (See Comments)    insomnia   Sulfamethoxazole Hives    Any sulfa meds.   Tape Other (See Comments)    Burns skin.    Latex Rash    "If pt. Makes contact with or wearing"   Neomycin Rash    Family History  Problem Relation Age of Onset   Stroke Father    Heart disease Other        maternal side   Colon cancer Paternal Uncle    Breast cancer Maternal Aunt    Colon cancer Cousin    Diabetes Neg Hx    Colon polyps Neg Hx    Rectal cancer Neg Hx    Stomach cancer Neg Hx     Family history reviewed and not pertinent    Prior to Admission medications    Medication Sig Start Date End Date Taking? Authorizing Provider  acetaminophen (TYLENOL) 500 MG tablet Take 1,000 mg by mouth every 8 (eight) hours as needed for moderate pain or mild pain.   Yes [provider]  amiodarone (PACERONE) 200 MG tablet Take 0.5 tablets (100 mg total) by mouth daily. 03/23/21  Yes [name], Peter M, MD  Ascorbic Acid (VITAMIN C) 1000 MG tablet Take 1,000 mg by mouth at bedtime. Ester C   Yes [provider]  Biotin 5000 MCG TABS Take 5,000 mcg by mouth daily.   Yes [provider]  Calcium Carbonate-Vitamin D (CALCIUM + D PO) Take 2 tablets by mouth daily. 1000 units Vitamin D 1200 mg calcium   Yes [provider]  Cyanocobalamin (VITAMIN B 12) 500 MCG TABS Take 1,000 mcg by mouth daily.    Yes [provider]  estradiol (ESTRACE) 0.1 MG/GM vaginal cream Place 1 g vaginally 2 (two) times a week. 06/13/18  Yes [provider]  fexofenadine (ALLEGRA) 180 MG tablet Take 180 mg by mouth daily.   Yes [provider]  furosemide (LASIX) 40 MG tablet TAKE 1 TABLET BY MOUTH EVERY DAY Patient taking differently: Take 40 mg by mouth daily. PT takes half tablet daily 09/15/20  Yes [name], Peter M, MD  losartan (COZAAR) 25 MG tablet TAKE 1 TABLET BY MOUTH EVERY DAY Patient taking differently: Take 25 mg by mouth daily. 07/14/20  Yes [name], Peter M, MD  metoprolol succinate (TOPROL-XL) 25 MG 24 hr tablet TAKE 1 TABLET BY MOUTH EVERY DAY Patient taking differently: Take 25 mg by mouth daily. 07/14/20  Yes [name], Peter M, MD  Polyethyl Glycol-Propyl Glycol (SYSTANE) 0.4-0.3 % SOLN Place 1 drop into both eyes daily as needed (dry eyes).   Yes [provider]  RABEprazole (ACIPHEX) 20 MG tablet Take one tablet by mouth once daily for stomach 01/22/21  Yes Wardell Honour, MD  SYNTHROID 100 MCG tablet TAKE 1 TABLET BY MOUTH DAILY BEFORE BREAKFAST. Patient taking differently: Take 100 mcg by mouth daily before breakfast.  12/16/20  Yes Wardell Honour, MD  XARELTO 15 MG TABS tablet TAKE 1 TABLET DAILY WITH   SUPPER (DOSE REDUCED) Patient taking differently: Take 15 mg by mouth daily. 01/20/21  Yes [name], Peter M, MD  escitalopram (LEXAPRO) 5 MG tablet Take 1 tablet (5 mg total) by mouth daily. Patient not taking: Reported on 03/23/2021 01/13/21   Wardell Honour, MD     Objective    Physical Exam: Vitals:   04/07/21 2030 04/07/21 2045 04/07/21 2145 04/07/21 2200  BP: (!) 138/47 122/75 (!) 142/71 (!) 142/72  Pulse: 74 75 67 69  Resp: 17 17 16 15   Temp:      TempSrc:      SpO2: 92% 96% 94% 94%    General: appears to be stated age; alert, oriented Skin: warm, dry, no rash Head:  AT/[place] Mouth:  Oral mucosa membranes appear moist, normal dentition Neck: supple; trachea midline Heart:  RRR; did not appreciate any M/R/G Lungs: CTAB, did not appreciate any wheezes, rales, or rhonchi Abdomen: + BS; soft, ND, NT Vascular: 2+ pedal pulses b/l; 2+ radial pulses b/l Extremities: no peripheral edema, no muscle wasting Neuro: strength and sensation intact in upper and lower extremities b/l    Labs on Admission: I have personally reviewed following labs and imaging studies  CBC: Recent Labs  Lab 04/07/21 1548  WBC 9.7  NEUTROABS 7.9*  HGB 11.4*  HCT 34.7*  MCV 94.0  PLT 742   Basic Metabolic Panel: Recent Labs  Lab 04/07/21 1548  NA 143  K 4.1  CL 110  CO2 24  GLUCOSE 116*  BUN 21  CREATININE 1.10*  CALCIUM 9.3   GFR: CrCl cannot be calculated (Unknown ideal weight.). Liver Function Tests: Recent  Labs  Lab 04/07/21 1548  AST 53*  ALT 24  ALKPHOS 67  BILITOT 1.3*  PROT 5.9*  ALBUMIN 3.5   No results for input(s): LIPASE, AMYLASE in the last 168 hours. No results for input(s): AMMONIA in the last 168 hours. Coagulation Profile: No results for input(s): INR, PROTIME in the last 168 hours. Cardiac Enzymes: No results for input(s): CKTOTAL, CKMB, CKMBINDEX, TROPONINI in the  last 168 hours. BNP (last 3 results) No results for input(s): PROBNP in the last 8760 hours. HbA1C: No results for input(s): HGBA1C in the last 72 hours. CBG: No results for input(s): GLUCAP in the last 168 hours. Lipid Profile: No results for input(s): CHOL, HDL, LDLCALC, TRIG, CHOLHDL, LDLDIRECT in the last 72 hours. Thyroid Function Tests: No results for input(s): TSH, T4TOTAL, FREET4, T3FREE, THYROIDAB in the last 72 hours. Anemia Panel: No results for input(s): VITAMINB12, FOLATE, FERRITIN, TIBC, IRON, RETICCTPCT in the last 72 hours. Urine analysis:    Component Value Date/Time   COLORURINE YELLOW 12/31/2012 1321   APPEARANCEUR Clear 11/21/2013 1412   LABSPEC 1.025 12/31/2012 1321   PHURINE 5.0 12/31/2012 1321   GLUCOSEU Negative 11/21/2013 1412   HGBUR NEGATIVE 12/31/2012 1321   BILIRUBINUR Negative 11/21/2013 1412   KETONESUR 15 (A) 12/31/2012 1321   PROTEINUR Negative 11/21/2013 1412   PROTEINUR NEGATIVE 12/31/2012 1321   UROBILINOGEN 0.2 12/31/2012 1321   NITRITE Negative 11/21/2013 1412   NITRITE NEGATIVE 12/31/2012 1321   LEUKOCYTESUR Negative 11/21/2013 1412    Radiological Exams on Admission: CT Head Wo Contrast  Result Date: 04/07/2021 CLINICAL DATA:  Status post fall, neck trauma EXAM: CT HEAD WITHOUT CONTRAST CT CERVICAL SPINE WITHOUT CONTRAST TECHNIQUE: Multidetector CT imaging of the head and cervical spine was performed following the standard protocol without intravenous contrast. Multiplanar CT image reconstructions of the cervical spine were also generated. RADIATION DOSE REDUCTION: This exam was performed according to the departmental dose-optimization program which includes automated exposure control, adjustment of the mA and/or kV according to patient size and/or use of iterative reconstruction technique. COMPARISON:  None. FINDINGS: Brain: No evidence of acute infarction, hemorrhage, extra-axial collection, ventriculomegaly, or mass effect. Generalized  cerebral atrophy. Periventricular white matter low attenuation likely secondary to microangiopathy. Vascular: Cerebrovascular atherosclerotic calcifications are noted. No hyperdense vessels. Skull: Negative for fracture or focal lesion. Sinuses/Orbits: Visualized portions of the orbits are unremarkable. Visualized portions of the paranasal sinuses are unremarkable. Visualized portions of the mastoid air cells are unremarkable. Other: None. CT CERVICAL SPINE FINDINGS Alignment: Normal. Skull base and vertebrae: No acute fracture. No primary bone lesion or focal pathologic process. Soft tissues and spinal canal: No prevertebral fluid or swelling. No visible canal hematoma. Disc levels: Degenerative disease with disc height loss at C6-7 with a broad-based disc osteophyte complex. Moderate bilateral facet arthropathy at C2-3. Severe bilateral facet arthropathy at C3-4. Severe bilateral facet arthropathy at C4-5 with bilateral foraminal narrowing. Severe bilateral facet arthropathy at C5-6. At C6-7 there is mild bilateral facet arthropathy, bilateral uncovertebral degenerative changes and bilateral foraminal stenosis. Upper chest: Lung apices are clear.  Centrilobular emphysema. Other: No fluid collection or hematoma. IMPRESSION: 1. No acute intracranial pathology. 2.  No acute osseous injury of the cervical spine. 3.  Emphysema (ICD10-J43.9). Electronically Signed   By: Kathreen Devoid M.D.   On: 04/07/2021 16:27   CT Cervical Spine Wo Contrast  Result Date: 04/07/2021 CLINICAL DATA:  Status post fall, neck trauma EXAM: CT HEAD WITHOUT CONTRAST CT CERVICAL SPINE WITHOUT CONTRAST TECHNIQUE: Multidetector  CT imaging of the head and cervical spine was performed following the standard protocol without intravenous contrast. Multiplanar CT image reconstructions of the cervical spine were also generated. RADIATION DOSE REDUCTION: This exam was performed according to the departmental dose-optimization program which includes  automated exposure control, adjustment of the mA and/or kV according to patient size and/or use of iterative reconstruction technique. COMPARISON:  None. FINDINGS: Brain: No evidence of acute infarction, hemorrhage, extra-axial collection, ventriculomegaly, or mass effect. Generalized cerebral atrophy. Periventricular white matter low attenuation likely secondary to microangiopathy. Vascular: Cerebrovascular atherosclerotic calcifications are noted. No hyperdense vessels. Skull: Negative for fracture or focal lesion. Sinuses/Orbits: Visualized portions of the orbits are unremarkable. Visualized portions of the paranasal sinuses are unremarkable. Visualized portions of the mastoid air cells are unremarkable. Other: None. CT CERVICAL SPINE FINDINGS Alignment: Normal. Skull base and vertebrae: No acute fracture. No primary bone lesion or focal pathologic process. Soft tissues and spinal canal: No prevertebral fluid or swelling. No visible canal hematoma. Disc levels: Degenerative disease with disc height loss at C6-7 with a broad-based disc osteophyte complex. Moderate bilateral facet arthropathy at C2-3. Severe bilateral facet arthropathy at C3-4. Severe bilateral facet arthropathy at C4-5 with bilateral foraminal narrowing. Severe bilateral facet arthropathy at C5-6. At C6-7 there is mild bilateral facet arthropathy, bilateral uncovertebral degenerative changes and bilateral foraminal stenosis. Upper chest: Lung apices are clear.  Centrilobular emphysema. Other: No fluid collection or hematoma. IMPRESSION: 1. No acute intracranial pathology. 2.  No acute osseous injury of the cervical spine. 3.  Emphysema (ICD10-J43.9). Electronically Signed   By: Kathreen Devoid M.D.   On: 04/07/2021 16:27   DG Knee Complete 4 Views Right  Result Date: 04/07/2021 CLINICAL DATA:  Found down after fall yesterday, right hip pain EXAM: RIGHT KNEE - COMPLETE 4+ VIEW COMPARISON:  None. FINDINGS: Frontal, bilateral oblique, lateral views  of the right knee are obtained. No acute fracture, subluxation, or dislocation. Bones are diffusely osteopenic. There is 3 compartmental osteoarthritis, with moderate joint space narrowing and osteophyte formation most pronounced in the lateral compartment. Small joint effusion likely reactive. Diffuse vascular calcifications. Soft tissues are otherwise unremarkable. IMPRESSION: 1. Moderate 3 compartmental osteoarthritis greatest in the lateral compartment. 2. Osteopenia. 3. Small joint effusion. 4. No acute fracture. Electronically Signed   By: Randa Ngo M.D.   On: 04/07/2021 16:43   DG Hip Unilat W or Wo Pelvis 2-3 Views Right  Result Date: 04/07/2021 CLINICAL DATA:  Found down after fall yesterday, right-sided hip pain EXAM: DG HIP (WITH OR WITHOUT PELVIS) 2-3V RIGHT COMPARISON:  None. FINDINGS: Frontal view of the pelvis as well as frontal and frogleg lateral views of the right hip are obtained. There are comminuted displaced fractures through the right superior and inferior pubic rami, with moderate displacement. No other acute displaced fractures. Symmetrical bilateral hip osteoarthritis. Sacroiliac joints are unremarkable. Mild spondylosis and facet hypertrophy at the lumbosacral junction. IMPRESSION: 1. Comminuted moderately displaced right superior and inferior pubic rami fractures. 2. Bilateral hip osteoarthritis. 3. Lower lumbar spondylosis and facet hypertrophy. Electronically Signed   By: Randa Ngo M.D.   On: 04/07/2021 16:42      Assessment/Plan   Principal Problem:   Pubic ramus fracture (HCC) Active Problems:   Hypothyroidism   HTN (hypertension)   Persistent atrial fibrillation ([place])   Fall at home, initial encounter   Chronic systolic CHF (congestive heart failure) ([employer])     #) Acute fractures of the right superior and left inferior pubic rami with  moderate displacement: In the setting of ground-level mechanical fall incurred on 04/06/2021 without loss of  consciousness and associated with presenting right-sided hip pain, plain films of the right hip/pelvis showing Acute fractures of the right superior and left inferior pubic rami with moderate displacement.  Within the confines of current suboptimal pain control, limiting full assessment of strength in the right lower extremity, it appears that the patient's left lower extremity is neurovascularly intact.  I suspect that these are nonsurgical fractures warranting medical management with emphasis on early mobilization, pain control, and physical therapy.     Plan: As needed [place].  Physical therapy/Occupational Therapy consult placed for the morning to assist with early mobilization.  Fall precautions.  Add on 25-hydroxy vitamin D level.       #) Ground level mechanical fall: The patient reports a ground level mechanical fall yesterday in which she tripped without any associated loss of consciousness. Appears to be purely mechanical in nature, without clinical evidence at this time to suggest contributory dizziness, presyncope, syncope, or acute ischemic CVA. Does not appear to have hit head as component of this fall. presenting CT head showed no evidence of acute intracranial process, including no evidence of intracranial hemorrhage, and presentation does not appear to be associated any acute neurologic deficits.  While this fall appears to be purely mechanical in nature, will also check urinalysis to evaluate for any underlying infectious contribution.   Plan: Check urinalysis, as above.  Repeat BMP and CBC with differential in the morning. Fall precautions.  PT/OT consults, as above.          #) Paroxysmal atrial fibrillation: Documented history of such. In setting of CHA2DS2-VASc score of 6, there is an indication for chronic anticoagulation for thromboembolic prophylaxis. Consistent with this, patient is chronically anticoagulated on Xarelto. Home AV nodal blocking regimen: Metoprolol  succinate.  Additionally, rhythm control strategy is pursued on an outpatient basis [facility] amiodarone.  Most recent echocardiogram occurred in August 2022, as further noted below.  Her history of paroxysmal atrial fibrillation is also complicated by sick sinus syndrome status post pacemaker placement.   Plan: monitor strict I's & O's and daily weights. Repeat BMP/CBC in AM. Check serum mag level. Continue home AV nodal blocking regimen.  Continue home Xarelto and amiodarone.         #) Chronic systolic heart failure: documented history of such, with most recent echocardiogram performed in August 2022, which is notable for LVEF 45 to 50%, global left ventricular hypokinesis, mildly dilated left ventricle, and normal diastolic function. No clinical evidence to suggest acutely decompensated heart failure at this time. home diuretic regimen reportedly consists of the following: Lasix 20 mg p.o. daily. Home cardiac medications also include the following: Metoprolol succinate, losartan.  etiology of patient's chronic systolic heart failure is felt to be nonischemic in nature, per chart review.   Plan: monitor strict I's & O's and daily weights. Repeat BMP in AM. Check serum mag level. Continue home diuretic regimen. Continue home losartan and metoprolol succinate.        #) Essential Hypertension: documented h/o such, with outpatient antihypertensive regimen including losartan and metoprolol succinate.  SBP's in the ED today: 120s  mmHg.   Plan: Close monitoring of subsequent BP via routine VS. continue home losartan and metoprolol succinate.       #) acquired hypothyroidism: documented h/o such, on Synthroid as outpatient.   Plan: cont home Synthroid.         DVT prophylaxis: Continue  home Xarelto Code Status: Full code Family Communication: none Disposition Plan: Per Rounding Team Consults called: none;  Admission status: Observation; MedSurg    PLEASE NOTE THAT DRAGON  DICTATION SOFTWARE WAS USED IN THE CONSTRUCTION OF THIS NOTE.   [place] DO [facility]  From [place]   04/08/2021, 3:52 AM

## 2021-04-08 NOTE — Progress Notes (Signed)
PROGRESS NOTE    [name]  UDJ:[id number] DOB: [birth year] DOA: 04/07/2021 PCP: Wardell Honour, MD    Brief Narrative:  [name] is a 88 y.o. female with medical history significant for paroxysmal atrial fibrillation chronically anticoagulated on Xarelto complicated by sick sinus syndrome status post pacemaker placement, chronic systolic heart failure in the setting of suspected ischemic cardiomyopathy with LVEF 45 to 50%, essential hypertension, acquired hypothyroidism, who is admitted to [facility] on 04/07/2021 with commuted moderately displaced right superior and inferior pubic rami fractures after presenting from home to [facility] ED complaining of right posterior hip pain.     Consultants:    Procedures:   Antimicrobials:      Subjective: If she doesn't move much no pain. No sob, no cp.  Objective: Vitals:   04/08/21 1153 04/08/21 1200 04/08/21 1402 04/08/21 1440  BP: (!) 157/75 (!) 146/87 (!) 140/98 (!) 141/76  Pulse: 66 68 67 68  Resp: 18 19 19    Temp: 97.7 F (36.5 C)  98 F (36.7 C) 97.9 F (36.6 C)  TempSrc: Oral  Oral Oral  SpO2: 94% 92% 96% 97%   No intake or output data in the 24 hours ending 04/08/21 1509 There were no vitals filed for this visit.  Examination:  General exam: Appears calm and comfortable  Respiratory system: Clear to auscultation. Respiratory effort normal. Cardiovascular system: S1 & S2 heard, RRR. No gallop Gastrointestinal system: Abdomen is nondistended, soft and nontender.  Normal bowel sounds heard. Central nervous system: Alert and oriented. No focal neurological deficits. Extremities: No edema Psychiatry: Judgement and insight appear normal. Mood & affect appropriate.     Data Reviewed: I have personally reviewed following labs and imaging studies  CBC: Recent Labs  Lab 04/07/21 1548 04/08/21 0414  WBC 9.7 9.5  NEUTROABS 7.9* 7.5  HGB 11.4* 10.9*  HCT 34.7* 32.3*  MCV 94.0 94.2  PLT 157 146*    Basic Metabolic Panel: Recent Labs  Lab 04/07/21 1548 04/08/21 0414  NA 143 142  K 4.1 3.5  CL 110 112*  CO2 24 21*  GLUCOSE 116* 113*  BUN 21 20  CREATININE 1.10* 0.99  CALCIUM 9.3 9.0  MG  --  2.0   GFR: CrCl cannot be calculated (Unknown ideal weight.). Liver Function Tests: Recent Labs  Lab 04/07/21 1548 04/08/21 0414  AST 53* 58*  ALT 24 25  ALKPHOS 67 58  BILITOT 1.3* 1.1  PROT 5.9* 5.3*  ALBUMIN 3.5 3.1*   No results for input(s): LIPASE, AMYLASE in the last 168 hours. No results for input(s): AMMONIA in the last 168 hours. Coagulation Profile: No results for input(s): INR, PROTIME in the last 168 hours. Cardiac Enzymes: No results for input(s): CKTOTAL, CKMB, CKMBINDEX, TROPONINI in the last 168 hours. BNP (last 3 results) No results for input(s): PROBNP in the last 8760 hours. HbA1C: No results for input(s): HGBA1C in the last 72 hours. CBG: No results for input(s): GLUCAP in the last 168 hours. Lipid Profile: No results for input(s): CHOL, HDL, LDLCALC, TRIG, CHOLHDL, LDLDIRECT in the last 72 hours. Thyroid Function Tests: No results for input(s): TSH, T4TOTAL, FREET4, T3FREE, THYROIDAB in the last 72 hours. Anemia Panel: No results for input(s): VITAMINB12, FOLATE, FERRITIN, TIBC, IRON, RETICCTPCT in the last 72 hours. Sepsis Labs: No results for input(s): PROCALCITON, LATICACIDVEN in the last 168 hours.  Recent Results (from the past 240 hour(s))  Resp Panel by RT-PCR (Flu A&B, Covid) Nasopharyngeal Swab  Status: None   Collection Time: 04/07/21  7:34 PM   Specimen: Nasopharyngeal Swab; Nasopharyngeal(NP) swabs in vial transport medium  Result Value Ref Range Status   SARS Coronavirus 2 by RT PCR NEGATIVE NEGATIVE Final    Comment: (NOTE) SARS-CoV-2 target nucleic acids are NOT DETECTED.  The SARS-CoV-2 RNA is generally detectable in upper respiratory specimens during the acute phase of infection. The lowest concentration of SARS-CoV-2  viral copies this assay can detect is 138 copies/mL. A negative result does not preclude SARS-Cov-2 infection and should not be used as the sole basis for treatment or other patient management decisions. A negative result may occur with  improper specimen collection/handling, submission of specimen other than nasopharyngeal swab, presence of viral mutation(s) within the areas targeted by this assay, and inadequate number of viral copies(<138 copies/mL). A negative result must be combined with clinical observations, patient history, and epidemiological information. The expected result is Negative.  Fact Sheet for Patients:  EntrepreneurPulse.com.au  Fact Sheet for Healthcare Providers:  IncredibleEmployment.be  This test is no t yet approved or cleared by the [place] FDA and  has been authorized for detection and/or diagnosis of SARS-CoV-2 by FDA under an Emergency Use Authorization (EUA). This EUA will remain  in effect (meaning this test can be used) for the duration of the COVID-19 declaration under Section 564(b)(1) of the Act, 21 U.S.C.section 360bbb-3(b)(1), unless the authorization is terminated  or revoked sooner.       Influenza A by PCR NEGATIVE NEGATIVE Final   Influenza B by PCR NEGATIVE NEGATIVE Final    Comment: (NOTE) The Xpert Xpress SARS-CoV-2/FLU/RSV plus assay is intended as an aid in the diagnosis of influenza from Nasopharyngeal swab specimens and should not be used as a sole basis for treatment. Nasal washings and aspirates are unacceptable for Xpert Xpress SARS-CoV-2/FLU/RSV testing.  Fact Sheet for Patients: EntrepreneurPulse.com.au  Fact Sheet for Healthcare Providers: IncredibleEmployment.be  This test is not yet approved or cleared by the [place] FDA and has been authorized for detection and/or diagnosis of SARS-CoV-2 by FDA under an Emergency Use Authorization  (EUA). This EUA will remain in effect (meaning this test can be used) for the duration of the COVID-19 declaration under Section 564(b)(1) of the Act, 21 U.S.C. section 360bbb-3(b)(1), unless the authorization is terminated or revoked.  Performed at [facility], [place] [address], [place], [place] [address]          Radiology Studies: CT Head Wo Contrast  Result Date: 04/07/2021 CLINICAL DATA:  Status post fall, neck trauma EXAM: CT HEAD WITHOUT CONTRAST CT CERVICAL SPINE WITHOUT CONTRAST TECHNIQUE: Multidetector CT imaging of the head and cervical spine was performed following the standard protocol without intravenous contrast. Multiplanar CT image reconstructions of the cervical spine were also generated. RADIATION DOSE REDUCTION: This exam was performed according to the departmental dose-optimization program which includes automated exposure control, adjustment of the mA and/or kV according to patient size and/or use of iterative reconstruction technique. COMPARISON:  None. FINDINGS: Brain: No evidence of acute infarction, hemorrhage, extra-axial collection, ventriculomegaly, or mass effect. Generalized cerebral atrophy. Periventricular white matter low attenuation likely secondary to microangiopathy. Vascular: Cerebrovascular atherosclerotic calcifications are noted. No hyperdense vessels. Skull: Negative for fracture or focal lesion. Sinuses/Orbits: Visualized portions of the orbits are unremarkable. Visualized portions of the paranasal sinuses are unremarkable. Visualized portions of the mastoid air cells are unremarkable. Other: None. CT CERVICAL SPINE FINDINGS Alignment: Normal. Skull base and vertebrae: No acute fracture. No primary  bone lesion or focal pathologic process. Soft tissues and spinal canal: No prevertebral fluid or swelling. No visible canal hematoma. Disc levels: Degenerative disease with disc height loss at C6-7 with a broad-based disc osteophyte complex. Moderate  bilateral facet arthropathy at C2-3. Severe bilateral facet arthropathy at C3-4. Severe bilateral facet arthropathy at C4-5 with bilateral foraminal narrowing. Severe bilateral facet arthropathy at C5-6. At C6-7 there is mild bilateral facet arthropathy, bilateral uncovertebral degenerative changes and bilateral foraminal stenosis. Upper chest: Lung apices are clear.  Centrilobular emphysema. Other: No fluid collection or hematoma. IMPRESSION: 1. No acute intracranial pathology. 2.  No acute osseous injury of the cervical spine. 3.  Emphysema (ICD10-J43.9). Electronically Signed   By: Kathreen Devoid M.D.   On: 04/07/2021 16:27   CT Cervical Spine Wo Contrast  Result Date: 04/07/2021 CLINICAL DATA:  Status post fall, neck trauma EXAM: CT HEAD WITHOUT CONTRAST CT CERVICAL SPINE WITHOUT CONTRAST TECHNIQUE: Multidetector CT imaging of the head and cervical spine was performed following the standard protocol without intravenous contrast. Multiplanar CT image reconstructions of the cervical spine were also generated. RADIATION DOSE REDUCTION: This exam was performed according to the departmental dose-optimization program which includes automated exposure control, adjustment of the mA and/or kV according to patient size and/or use of iterative reconstruction technique. COMPARISON:  None. FINDINGS: Brain: No evidence of acute infarction, hemorrhage, extra-axial collection, ventriculomegaly, or mass effect. Generalized cerebral atrophy. Periventricular white matter low attenuation likely secondary to microangiopathy. Vascular: Cerebrovascular atherosclerotic calcifications are noted. No hyperdense vessels. Skull: Negative for fracture or focal lesion. Sinuses/Orbits: Visualized portions of the orbits are unremarkable. Visualized portions of the paranasal sinuses are unremarkable. Visualized portions of the mastoid air cells are unremarkable. Other: None. CT CERVICAL SPINE FINDINGS Alignment: Normal. Skull base and  vertebrae: No acute fracture. No primary bone lesion or focal pathologic process. Soft tissues and spinal canal: No prevertebral fluid or swelling. No visible canal hematoma. Disc levels: Degenerative disease with disc height loss at C6-7 with a broad-based disc osteophyte complex. Moderate bilateral facet arthropathy at C2-3. Severe bilateral facet arthropathy at C3-4. Severe bilateral facet arthropathy at C4-5 with bilateral foraminal narrowing. Severe bilateral facet arthropathy at C5-6. At C6-7 there is mild bilateral facet arthropathy, bilateral uncovertebral degenerative changes and bilateral foraminal stenosis. Upper chest: Lung apices are clear.  Centrilobular emphysema. Other: No fluid collection or hematoma. IMPRESSION: 1. No acute intracranial pathology. 2.  No acute osseous injury of the cervical spine. 3.  Emphysema (ICD10-J43.9). Electronically Signed   By: Kathreen Devoid M.D.   On: 04/07/2021 16:27   DG Knee Complete 4 Views Right  Result Date: 04/07/2021 CLINICAL DATA:  Found down after fall yesterday, right hip pain EXAM: RIGHT KNEE - COMPLETE 4+ VIEW COMPARISON:  None. FINDINGS: Frontal, bilateral oblique, lateral views of the right knee are obtained. No acute fracture, subluxation, or dislocation. Bones are diffusely osteopenic. There is 3 compartmental osteoarthritis, with moderate joint space narrowing and osteophyte formation most pronounced in the lateral compartment. Small joint effusion likely reactive. Diffuse vascular calcifications. Soft tissues are otherwise unremarkable. IMPRESSION: 1. Moderate 3 compartmental osteoarthritis greatest in the lateral compartment. 2. Osteopenia. 3. Small joint effusion. 4. No acute fracture. Electronically Signed   By: Randa Ngo M.D.   On: 04/07/2021 16:43   DG Hip Unilat W or Wo Pelvis 2-3 Views Right  Result Date: 04/07/2021 CLINICAL DATA:  Found down after fall yesterday, right-sided hip pain EXAM: DG HIP (WITH OR WITHOUT PELVIS) 2-3V RIGHT  COMPARISON:  None. FINDINGS: Frontal view of the pelvis as well as frontal and frogleg lateral views of the right hip are obtained. There are comminuted displaced fractures through the right superior and inferior pubic rami, with moderate displacement. No other acute displaced fractures. Symmetrical bilateral hip osteoarthritis. Sacroiliac joints are unremarkable. Mild spondylosis and facet hypertrophy at the lumbosacral junction. IMPRESSION: 1. Comminuted moderately displaced right superior and inferior pubic rami fractures. 2. Bilateral hip osteoarthritis. 3. Lower lumbar spondylosis and facet hypertrophy. Electronically Signed   By: Randa Ngo M.D.   On: 04/07/2021 16:42        Scheduled Meds:  amiodarone  100 mg Oral Daily   furosemide  20 mg Oral Daily   levothyroxine  100 mcg Oral QAC breakfast   loratadine  10 mg Oral Daily   losartan  25 mg Oral Daily   metoprolol succinate  25 mg Oral Daily   Rivaroxaban  15 mg Oral Q supper   Continuous Infusions:  Assessment & Plan:   Principal Problem:   Pubic ramus fracture (HCC) Active Problems:   Hypothyroidism   HTN (hypertension)   Persistent atrial fibrillation ([place])   Fall at home, initial encounter   Chronic systolic CHF (congestive heart failure) ([place])   #) Acute fractures of the right superior and left inferior pubic rami with moderate displacement: In the setting of ground-level mechanical fall incurred on 04/06/2021 without loss of consciousness and associated with presenting right-sided hip pain, plain films of the right hip/pelvis showing Acute fractures of the right superior and left inferior pubic rami with moderate displacement.   2/1 pain control, will add lidocaine patch  PT/OT -rec. SNF Check vitamin D level pending         #) Ground level mechanical fall:   CTA of the head negative acute intracranial issue As above             #) Paroxysmal atrial fibrillation:  Continue beta blk , amiodarone and  a/c      #) Chronic systolic heart failure: documented history of such, with most recent echocardiogram performed in August 2022, which is notable for LVEF 45 to 50% 2/1 no acute exacerbation Continue ARB, beta blk, lasix       #) Essential Hypertension:  Stable Continue losartan, beta-blockers, furosemide     #) acquired hypothyroidism:  Continue Synthroid     DVT prophylaxis: Xarelto Code Status: Full Family Communication: None at bedside Disposition Plan: SNF Status is: Observation The patient remains OBS appropriate and will d/c before 2 midnights.              LOS: 0 days  Time spent: 35 minutes with  >50% on COC    Nolberto Hanlon, MD [facility] Pager [phone]  If 7PM-7AM, please contact night-coverage 04/08/2021, 3:09 PM

## 2021-04-09 DIAGNOSIS — G2581 Restless legs syndrome: Secondary | ICD-10-CM | POA: Diagnosis not present

## 2021-04-09 DIAGNOSIS — Y92017 Garden or yard in single-family (private) house as the place of occurrence of the external cause: Secondary | ICD-10-CM | POA: Diagnosis not present

## 2021-04-09 DIAGNOSIS — J329 Chronic sinusitis, unspecified: Secondary | ICD-10-CM | POA: Diagnosis not present

## 2021-04-09 DIAGNOSIS — Z91040 Latex allergy status: Secondary | ICD-10-CM | POA: Diagnosis not present

## 2021-04-09 DIAGNOSIS — M160 Bilateral primary osteoarthritis of hip: Secondary | ICD-10-CM | POA: Diagnosis present

## 2021-04-09 DIAGNOSIS — I110 Hypertensive heart disease with heart failure: Secondary | ICD-10-CM | POA: Diagnosis present

## 2021-04-09 DIAGNOSIS — Z87891 Personal history of nicotine dependence: Secondary | ICD-10-CM | POA: Diagnosis not present

## 2021-04-09 DIAGNOSIS — Z20822 Contact with and (suspected) exposure to covid-19: Secondary | ICD-10-CM | POA: Diagnosis present

## 2021-04-09 DIAGNOSIS — E538 Deficiency of other specified B group vitamins: Secondary | ICD-10-CM | POA: Diagnosis not present

## 2021-04-09 DIAGNOSIS — Z7401 Bed confinement status: Secondary | ICD-10-CM | POA: Diagnosis not present

## 2021-04-09 DIAGNOSIS — Z881 Allergy status to other antibiotic agents status: Secondary | ICD-10-CM | POA: Diagnosis not present

## 2021-04-09 DIAGNOSIS — F331 Major depressive disorder, recurrent, moderate: Secondary | ICD-10-CM | POA: Diagnosis not present

## 2021-04-09 DIAGNOSIS — T148XXA Other injury of unspecified body region, initial encounter: Secondary | ICD-10-CM | POA: Diagnosis not present

## 2021-04-09 DIAGNOSIS — E785 Hyperlipidemia, unspecified: Secondary | ICD-10-CM | POA: Diagnosis not present

## 2021-04-09 DIAGNOSIS — S3282XD Multiple fractures of pelvis without disruption of pelvic ring, subsequent encounter for fracture with routine healing: Secondary | ICD-10-CM | POA: Diagnosis not present

## 2021-04-09 DIAGNOSIS — I428 Other cardiomyopathies: Secondary | ICD-10-CM | POA: Diagnosis present

## 2021-04-09 DIAGNOSIS — Z85820 Personal history of malignant melanoma of skin: Secondary | ICD-10-CM | POA: Diagnosis not present

## 2021-04-09 DIAGNOSIS — R531 Weakness: Secondary | ICD-10-CM | POA: Diagnosis not present

## 2021-04-09 DIAGNOSIS — W19XXXA Unspecified fall, initial encounter: Secondary | ICD-10-CM | POA: Diagnosis not present

## 2021-04-09 DIAGNOSIS — S79911A Unspecified injury of right hip, initial encounter: Secondary | ICD-10-CM | POA: Diagnosis present

## 2021-04-09 DIAGNOSIS — M1990 Unspecified osteoarthritis, unspecified site: Secondary | ICD-10-CM | POA: Diagnosis not present

## 2021-04-09 DIAGNOSIS — E039 Hypothyroidism, unspecified: Secondary | ICD-10-CM | POA: Diagnosis present

## 2021-04-09 DIAGNOSIS — I5022 Chronic systolic (congestive) heart failure: Secondary | ICD-10-CM | POA: Diagnosis present

## 2021-04-09 DIAGNOSIS — S32501S Unspecified fracture of right pubis, sequela: Secondary | ICD-10-CM | POA: Diagnosis not present

## 2021-04-09 DIAGNOSIS — S32591A Other specified fracture of right pubis, initial encounter for closed fracture: Secondary | ICD-10-CM | POA: Diagnosis present

## 2021-04-09 DIAGNOSIS — Z7901 Long term (current) use of anticoagulants: Secondary | ICD-10-CM | POA: Diagnosis not present

## 2021-04-09 DIAGNOSIS — Z79899 Other long term (current) drug therapy: Secondary | ICD-10-CM | POA: Diagnosis not present

## 2021-04-09 DIAGNOSIS — K219 Gastro-esophageal reflux disease without esophagitis: Secondary | ICD-10-CM | POA: Diagnosis not present

## 2021-04-09 DIAGNOSIS — Z888 Allergy status to other drugs, medicaments and biological substances status: Secondary | ICD-10-CM | POA: Diagnosis not present

## 2021-04-09 DIAGNOSIS — I10 Essential (primary) hypertension: Secondary | ICD-10-CM | POA: Diagnosis not present

## 2021-04-09 DIAGNOSIS — Z823 Family history of stroke: Secondary | ICD-10-CM | POA: Diagnosis not present

## 2021-04-09 DIAGNOSIS — I495 Sick sinus syndrome: Secondary | ICD-10-CM | POA: Diagnosis present

## 2021-04-09 DIAGNOSIS — S32599D Other specified fracture of unspecified pubis, subsequent encounter for fracture with routine healing: Secondary | ICD-10-CM | POA: Diagnosis not present

## 2021-04-09 DIAGNOSIS — I480 Paroxysmal atrial fibrillation: Secondary | ICD-10-CM | POA: Diagnosis not present

## 2021-04-09 DIAGNOSIS — Z882 Allergy status to sulfonamides status: Secondary | ICD-10-CM | POA: Diagnosis not present

## 2021-04-09 DIAGNOSIS — Z800 Family history of malignant neoplasm of digestive organs: Secondary | ICD-10-CM | POA: Diagnosis not present

## 2021-04-09 DIAGNOSIS — I714 Abdominal aortic aneurysm, without rupture, unspecified: Secondary | ICD-10-CM | POA: Diagnosis present

## 2021-04-09 DIAGNOSIS — F419 Anxiety disorder, unspecified: Secondary | ICD-10-CM | POA: Diagnosis not present

## 2021-04-09 DIAGNOSIS — W010XXA Fall on same level from slipping, tripping and stumbling without subsequent striking against object, initial encounter: Secondary | ICD-10-CM | POA: Diagnosis present

## 2021-04-09 DIAGNOSIS — I4819 Other persistent atrial fibrillation: Secondary | ICD-10-CM | POA: Diagnosis present

## 2021-04-09 DIAGNOSIS — Y92009 Unspecified place in unspecified non-institutional (private) residence as the place of occurrence of the external cause: Secondary | ICD-10-CM | POA: Diagnosis not present

## 2021-04-09 DIAGNOSIS — Z803 Family history of malignant neoplasm of breast: Secondary | ICD-10-CM | POA: Diagnosis not present

## 2021-04-09 DIAGNOSIS — Z79890 Hormone replacement therapy (postmenopausal): Secondary | ICD-10-CM | POA: Diagnosis not present

## 2021-04-09 DIAGNOSIS — Z950 Presence of cardiac pacemaker: Secondary | ICD-10-CM | POA: Diagnosis not present

## 2021-04-09 DIAGNOSIS — G4700 Insomnia, unspecified: Secondary | ICD-10-CM | POA: Diagnosis not present

## 2021-04-09 DIAGNOSIS — Z885 Allergy status to narcotic agent status: Secondary | ICD-10-CM | POA: Diagnosis not present

## 2021-04-09 MED ORDER — VITAMIN D (ERGOCALCIFEROL) 1.25 MG (50000 UNIT) PO CAPS
50000.0000 [IU] | ORAL_CAPSULE | ORAL | Status: DC
  Administered 2021-04-09: 50000 [IU] via ORAL
  Filled 2021-04-09: qty 1

## 2021-04-09 NOTE — Consult Note (Signed)
Reason for Consult:Pelvic fxs Referring Physician: Nolberto Hanlon Time called: 0623 Time at bedside: [name] is an 88 y.o. female.  HPI: [name] fell at home 3d ago after tripping. She had immediate right hip and knee pain. She was brought to the ED where x-rays showed pubic rami fxs. She was admitted and orthopedic surgery was consulted.  Past Medical History:  Diagnosis Date   AAA (abdominal aortic aneurysm)    2.8 cm 05/2018; 5 year [place] per ACR guidelines   Allergy    all year    Anxiety disorder    Arthritis    Barrett's esophagus 04/2008   Benign neoplasm of colon    Blood transfusion without reported diagnosis    long ago per pt   Cataract    removed both eyes   CHF (congestive heart failure) ([place])    Chronic airway obstruction, not elsewhere classified    Complication of anesthesia    slow to wake up   Depressive disorder, not elsewhere classified    Diverticulitis    Dysthymic disorder    Erosive gastritis 08/2004   GERD (gastroesophageal reflux disease)    Hair thinning    HTN (hypertension)    pt denies    Hyperplastic polyps of stomach 06/2002   Hypothyroidism    ICD (implantable cardiac defibrillator) in place    BiV/ICD; s/p removal of ICD with insertion of BiV PPM 04/26/12   Incisional hernia    Insomnia 08/20/2015   Kyphosis 08/20/2015   LBBB (left bundle branch block)    Melanoma (HCC)    right arm   Mitral regurgitation    Nonischemic cardiomyopathy ([place])    EF initially 20%; Last measurement up to 50% per echo in August of 2012   Osteopenia    Other and unspecified hyperlipidemia    Other and unspecified hyperlipidemia    Pacemaker 04/26/2012   PAF (paroxysmal atrial fibrillation) ([place])    11/2016   Pain in joint, lower leg    Palpitations    Pneumonia    Restless legs syndrome (RLS)    Synovial cyst of popliteal space    Unspecified chronic bronchitis (HCC)    Unspecified nasal polyp    Unspecified sinusitis (chronic)     Past  Surgical History:  Procedure Laterality Date   ABDOMINAL HYSTERECTOMY  1076   endometriosis   APPENDECTOMY     BI-VENTRICULAR PACEMAKER INSERTION N/A 04/26/2012   Procedure: BI-VENTRICULAR PACEMAKER INSERTION (CRT-P);  Surgeon: Evans Lance, MD;  Location: [facility] CATH LAB;  Service: Cardiovascular;  Laterality: N/A;   BIV PACEMAKER GENERATOR CHANGEOUT N/A 01/26/2019   Procedure: BIV PACEMAKER GENERATOR CHANGEOUT;  Surgeon: Evans Lance, MD;  Location: [facility] CV LAB;  Service: Cardiovascular;  Laterality: N/A;   CARDIOVERSION N/A 05/06/2020   Procedure: CARDIOVERSION;  Surgeon: Pixie Casino, MD;  Location: [facility];  Service: Cardiovascular;  Laterality: N/A;   CARDIOVERSION N/A 05/19/2020   Procedure: CARDIOVERSION;  Surgeon: Jerline Pain, MD;  Location: [facility] ENDOSCOPY;  Service: Cardiovascular;  Laterality: N/A;   COLONOSCOPY     defibrillator insertion     s/p removal of previously implanted BiV ICD and insertion of a new BiV pacemaker on 04/26/12   excise mole of lip  2001   Dr. Larena Sox   excision of wen  1984   Dr. Parks Ranger   FEMORAL HERNIA REPAIR  12/25/2012   Dr Dalbert Batman   FEMORAL HERNIA REPAIR  01/04/2013   Recurrent -  Dr. Nash Mantis HERNIA REPAIR N/A 10/01/2013   Procedure: LAPAROSCOPIC INCISIONAL HERNIA;  Surgeon: Gayland Curry, MD;  Location: [facility] ORS;  Service: General;  Laterality: N/A;   INCISIONAL HERNIA REPAIR N/A 09/07/2018   Procedure: LAPAROSCOPIC ASSISTED REPAIR OF INCISIONAL HERNIA;  Surgeon: Greer Pickerel, MD;  Location: [facility];  Service: General;  Laterality: N/A;   INGUINAL HERNIA REPAIR Right 09/26/2012   Procedure: RIGHT INGUINAL HERNIA REPAIR WITH MESH;  Surgeon: Odis Hollingshead, MD;  Location: [facility];  Service: General;  Laterality: Right;   INGUINAL HERNIA REPAIR Right 12/25/2012   Procedure: explor right groin, small bowel rescection, tissue repair right femoral hernia;  Surgeon: Adin Hector, MD;  Location: [facility];  Service: General;   Laterality: Right;   INSERTION OF MESH Right 09/26/2012   Procedure: INSERTION OF MESH;  Surgeon: Odis Hollingshead, MD;  Location: [facility];  Service: General;  Laterality: Right;   INSERTION OF MESH N/A 10/01/2013   Procedure: INSERTION OF MESH;  Surgeon: Gayland Curry, MD;  Location: [facility];  Service: General;  Laterality: N/A;   LAPAROSCOPY N/A 01/04/2013   Procedure: Diagnostic Laparoscopy, exploratory laparotomy with small bowel resection, closure of right femoral hernia repair;  Surgeon: Madilyn Hook, DO;  Location: [facility];  Service: General;  Laterality: N/A;   Left arm surgery     POLYPECTOMY     RIGHT BREAST LUMPECTOMY  1988   Dr. Marylene Buerger   right knee surgery     arthroscopy: Dr. Shellia Carwin   TONSILLECTOMY     UPPER GASTROINTESTINAL ENDOSCOPY      Family History  Problem Relation Age of Onset   Stroke Father    Heart disease Other        maternal side   Colon cancer Paternal Uncle    Breast cancer Maternal Aunt    Colon cancer Cousin    Diabetes Neg Hx    Colon polyps Neg Hx    Rectal cancer Neg Hx    Stomach cancer Neg Hx     Social History:  reports that she quit smoking about 27 years ago. Her smoking use included cigarettes. She has a 15.00 pack-year smoking history. She has never used smokeless tobacco. She reports that she does not drink alcohol and does not use drugs.  Allergies:  Allergies  Allergen Reactions   Hydrocodone Itching   Cephalexin Itching and Swelling   Ciprofloxacin Other (See Comments)    Not indicated due to aneurysm in aorta     Doxycycline Other (See Comments)    Unknown reaction   Escitalopram Other (See Comments)    Dry mouth   Levofloxacin Other (See Comments)    Not indicated due to aneurysm in aorta    Moxifloxacin Other (See Comments)    Not indicated due to aneurysm in aorta    Ofloxacin Other (See Comments)    Unknown reaction   Sertraline Other (See Comments)    insomnia   Sulfamethoxazole Hives    Any sulfa meds.   Tape  Other (See Comments)    Burns skin.    Latex Rash    "If pt. Makes contact with or wearing"   Neomycin Rash    Medications: I have reviewed the patient's current medications.  Results for orders placed or performed during the hospital encounter of 04/07/21 (from the past 48 hour(s))  CBC with Differential     Status: Abnormal   Collection Time: 04/07/21  3:48 PM  Result  Value Ref Range   WBC 9.7 4.0 - 10.5 K/uL   RBC 3.69 (L) 3.87 - 5.11 MIL/uL   Hemoglobin 11.4 (L) 12.0 - 15.0 g/dL   HCT 34.7 (L) 36.0 - 46.0 %   MCV 94.0 80.0 - 100.0 fL   MCH 30.9 26.0 - 34.0 pg   MCHC 32.9 30.0 - 36.0 g/dL   RDW 13.2 11.5 - 15.5 %   Platelets 157 150 - 400 K/uL   nRBC 0.0 0.0 - 0.2 %   Neutrophils Relative % 81 %   Neutro Abs 7.9 (H) 1.7 - 7.7 K/uL   Lymphocytes Relative 6 %   Lymphs Abs 0.6 (L) 0.7 - 4.0 K/uL   Monocytes Relative 12 %   Monocytes Absolute 1.2 (H) 0.1 - 1.0 K/uL   Eosinophils Relative 0 %   Eosinophils Absolute 0.0 0.0 - 0.5 K/uL   Basophils Relative 0 %   Basophils Absolute 0.0 0.0 - 0.1 K/uL   Immature Granulocytes 1 %   Abs Immature Granulocytes 0.05 0.00 - 0.07 K/uL    Comment: Performed at [facility], [address], [place], [place] [address]  Comprehensive metabolic panel     Status: Abnormal   Collection Time: 04/07/21  3:48 PM  Result Value Ref Range   Sodium 143 135 - 145 mmol/L   Potassium 4.1 3.5 - 5.1 mmol/L   Chloride 110 98 - 111 mmol/L   CO2 24 22 - 32 mmol/L   Glucose, Bld 116 (H) 70 - 99 mg/dL    Comment: Glucose reference range applies only to samples taken after fasting for at least 8 hours.   BUN 21 8 - 23 mg/dL   Creatinine, Ser 1.10 (H) 0.44 - 1.00 mg/dL   Calcium 9.3 8.9 - 10.3 mg/dL   Total Protein 5.9 (L) 6.5 - 8.1 g/dL   Albumin 3.5 3.5 - 5.0 g/dL   AST 53 (H) 15 - 41 U/L   ALT 24 0 - 44 U/L   Alkaline Phosphatase 67 38 - 126 U/L   Total Bilirubin 1.3 (H) 0.3 - 1.2 mg/dL   GFR, Estimated 49 (L) >60 mL/min    Comment:  (NOTE) Calculated using the CKD-EPI Creatinine Equation (2021)    Anion gap 9 5 - 15    Comment: Performed at [facility], [place] [address], [place],  [address]  Resp Panel by RT-PCR (Flu A&B, Covid) Nasopharyngeal Swab     Status: None   Collection Time: 04/07/21  7:34 PM   Specimen: Nasopharyngeal Swab; Nasopharyngeal(NP) swabs in vial transport medium  Result Value Ref Range   SARS Coronavirus 2 by RT PCR NEGATIVE NEGATIVE    Comment: (NOTE) SARS-CoV-2 target nucleic acids are NOT DETECTED.  The SARS-CoV-2 RNA is generally detectable in upper respiratory specimens during the acute phase of infection. The lowest concentration of SARS-CoV-2 viral copies this assay can detect is 138 copies/mL. A negative result does not preclude SARS-Cov-2 infection and should not be used as the sole basis for treatment or other patient management decisions. A negative result may occur with  improper specimen collection/handling, submission of specimen other than nasopharyngeal swab, presence of viral mutation(s) within the areas targeted by this assay, and inadequate number of viral copies(<138 copies/mL). A negative result must be combined with clinical observations, patient history, and epidemiological information. The expected result is Negative.  Fact Sheet for Patients:  EntrepreneurPulse.com.au  Fact Sheet for Healthcare Providers:  IncredibleEmployment.be  This test is no  t yet approved or cleared by the [place] and  has been authorized for detection and/or diagnosis of SARS-CoV-2 by FDA under an Emergency Use Authorization (EUA). This EUA will remain  in effect (meaning this test can be used) for the duration of the COVID-19 declaration under Section 564(b)(1) of the Act, 21 U.S.C.section 360bbb-3(b)(1), unless the authorization is terminated  or revoked sooner.       Influenza A by PCR NEGATIVE NEGATIVE   Influenza B by PCR  NEGATIVE NEGATIVE    Comment: (NOTE) The Xpert Xpress SARS-CoV-2/FLU/RSV plus assay is intended as an aid in the diagnosis of influenza from Nasopharyngeal swab specimens and should not be used as a sole basis for treatment. Nasal washings and aspirates are unacceptable for Xpert Xpress SARS-CoV-2/FLU/RSV testing.  Fact Sheet for Patients: EntrepreneurPulse.com.au  Fact Sheet for Healthcare Providers: IncredibleEmployment.be  This test is not yet approved or cleared by the [place] FDA and has been authorized for detection and/or diagnosis of SARS-CoV-2 by FDA under an Emergency Use Authorization (EUA). This EUA will remain in effect (meaning this test can be used) for the duration of the COVID-19 declaration under Section 564(b)(1) of the Act, 21 U.S.C. section 360bbb-3(b)(1), unless the authorization is terminated or revoked.  Performed at [facility], [place] [address], [place], [place] [address]   Magnesium     Status: None   Collection Time: 04/08/21  4:14 AM  Result Value Ref Range   Magnesium 2.0 1.7 - 2.4 mg/dL    Comment: Performed at [facility] [address] [place], [place] [address]  Comprehensive metabolic panel     Status: Abnormal   Collection Time: 04/08/21  4:14 AM  Result Value Ref Range   Sodium 142 135 - 145 mmol/L   Potassium 3.5 3.5 - 5.1 mmol/L   Chloride 112 (H) 98 - 111 mmol/L   CO2 21 (L) 22 - 32 mmol/L   Glucose, Bld 113 (H) 70 - 99 mg/dL    Comment: Glucose reference range applies only to samples taken after fasting for at least 8 hours.   BUN 20 8 - 23 mg/dL   Creatinine, Ser 0.99 0.44 - 1.00 mg/dL   Calcium 9.0 8.9 - 10.3 mg/dL   Total Protein 5.3 (L) 6.5 - 8.1 g/dL   Albumin 3.1 (L) 3.5 - 5.0 g/dL   AST 58 (H) 15 - 41 U/L   ALT 25 0 - 44 U/L   Alkaline Phosphatase 58 38 - 126 U/L   Total Bilirubin 1.1 0.3 - 1.2 mg/dL   GFR, Estimated 56 (L) >60 mL/min    Comment: (NOTE) Calculated  using the CKD-EPI Creatinine Equation (2021)    Anion gap 9 5 - 15    Comment: Performed at [facility], [place] [address], [place], [place] [address]  CBC with Differential/Platelet     Status: Abnormal   Collection Time: 04/08/21  4:14 AM  Result Value Ref Range   WBC 9.5 4.0 - 10.5 K/uL   RBC 3.43 (L) 3.87 - 5.11 MIL/uL   Hemoglobin 10.9 (L) 12.0 - 15.0 g/dL   HCT 32.3 (L) 36.0 - 46.0 %   MCV 94.2 80.0 - 100.0 fL   MCH 31.8 26.0 - 34.0 pg   MCHC 33.7 30.0 - 36.0 g/dL   RDW 13.2 11.5 - 15.5 %   Platelets 146 (L) 150 - 400 K/uL   nRBC 0.0 0.0 - 0.2 %   Neutrophils Relative %  80 %   Neutro Abs 7.5 1.7 - 7.7 K/uL   Lymphocytes Relative 8 %   Lymphs Abs 0.8 0.7 - 4.0 K/uL   Monocytes Relative 12 %   Monocytes Absolute 1.1 (H) 0.1 - 1.0 K/uL   Eosinophils Relative 0 %   Eosinophils Absolute 0.0 0.0 - 0.5 K/uL   Basophils Relative 0 %   Basophils Absolute 0.0 0.0 - 0.1 K/uL   Immature Granulocytes 0 %   Abs Immature Granulocytes 0.04 0.00 - 0.07 K/uL    Comment: Performed at [facility] [address], [place], [place] [address]  VITAMIN D 25 Hydroxy (Vit-D Deficiency, Fractures)     Status: Abnormal   Collection Time: 04/08/21  3:11 PM  Result Value Ref Range   Vit D, 25-Hydroxy 27.45 (L) 30 - 100 ng/mL    Comment: (NOTE) Vitamin D deficiency has been defined by the Institute of Medicine  and an Endocrine Society practice guideline as a level of serum 25-OH  vitamin D less than 20 ng/mL (1,2). The Endocrine Society went on to  further define vitamin D insufficiency as a level between 21 and 29  ng/mL (2).  1. IOM (Institute of Medicine). 2010. Dietary reference intakes for  calcium and D. [place]: The [employer]. 2. Holick MF, Binkley [place], Bischoff-Ferrari HA, et al. Evaluation,  treatment, and prevention of vitamin D deficiency: an Endocrine  Society clinical practice guideline, JCEM. 2011 Jul; 96(7): 1911-30.  Performed at [facility], [place] [address] [place], [place] [address]     CT Head Wo Contrast  Result Date: 04/07/2021 CLINICAL DATA:  Status post fall, neck trauma EXAM: CT HEAD WITHOUT CONTRAST CT CERVICAL SPINE WITHOUT CONTRAST TECHNIQUE: Multidetector CT imaging of the head and cervical spine was performed following the standard protocol without intravenous contrast. Multiplanar CT image reconstructions of the cervical spine were also generated. RADIATION DOSE REDUCTION: This exam was performed according to the departmental dose-optimization program which includes automated exposure control, adjustment of the mA and/or kV according to patient size and/or use of iterative reconstruction technique. COMPARISON:  None. FINDINGS: Brain: No evidence of acute infarction, hemorrhage, extra-axial collection, ventriculomegaly, or mass effect. Generalized cerebral atrophy. Periventricular white matter low attenuation likely secondary to microangiopathy. Vascular: Cerebrovascular atherosclerotic calcifications are noted. No hyperdense vessels. Skull: Negative for fracture or focal lesion. Sinuses/Orbits: Visualized portions of the orbits are unremarkable. Visualized portions of the paranasal sinuses are unremarkable. Visualized portions of the mastoid air cells are unremarkable. Other: None. CT CERVICAL SPINE FINDINGS Alignment: Normal. Skull base and vertebrae: No acute fracture. No primary bone lesion or focal pathologic process. Soft tissues and spinal canal: No prevertebral fluid or swelling. No visible canal hematoma. Disc levels: Degenerative disease with disc height loss at C6-7 with a broad-based disc osteophyte complex. Moderate bilateral facet arthropathy at C2-3. Severe bilateral facet arthropathy at C3-4. Severe bilateral facet arthropathy at C4-5 with bilateral foraminal narrowing. Severe bilateral facet arthropathy at C5-6. At C6-7 there is mild bilateral facet arthropathy, bilateral uncovertebral degenerative changes  and bilateral foraminal stenosis. Upper chest: Lung apices are clear.  Centrilobular emphysema. Other: No fluid collection or hematoma. IMPRESSION: 1. No acute intracranial pathology. 2.  No acute osseous injury of the cervical spine. 3.  Emphysema (ICD10-J43.9). Electronically Signed   By: Kathreen Devoid M.D.   On: 04/07/2021 16:27   CT Cervical Spine Wo Contrast  Result Date: 04/07/2021 CLINICAL DATA:  Status post fall, neck trauma EXAM: CT  HEAD WITHOUT CONTRAST CT CERVICAL SPINE WITHOUT CONTRAST TECHNIQUE: Multidetector CT imaging of the head and cervical spine was performed following the standard protocol without intravenous contrast. Multiplanar CT image reconstructions of the cervical spine were also generated. RADIATION DOSE REDUCTION: This exam was performed according to the departmental dose-optimization program which includes automated exposure control, adjustment of the mA and/or kV according to patient size and/or use of iterative reconstruction technique. COMPARISON:  None. FINDINGS: Brain: No evidence of acute infarction, hemorrhage, extra-axial collection, ventriculomegaly, or mass effect. Generalized cerebral atrophy. Periventricular white matter low attenuation likely secondary to microangiopathy. Vascular: Cerebrovascular atherosclerotic calcifications are noted. No hyperdense vessels. Skull: Negative for fracture or focal lesion. Sinuses/Orbits: Visualized portions of the orbits are unremarkable. Visualized portions of the paranasal sinuses are unremarkable. Visualized portions of the mastoid air cells are unremarkable. Other: None. CT CERVICAL SPINE FINDINGS Alignment: Normal. Skull base and vertebrae: No acute fracture. No primary bone lesion or focal pathologic process. Soft tissues and spinal canal: No prevertebral fluid or swelling. No visible canal hematoma. Disc levels: Degenerative disease with disc height loss at C6-7 with a broad-based disc osteophyte complex. Moderate bilateral facet  arthropathy at C2-3. Severe bilateral facet arthropathy at C3-4. Severe bilateral facet arthropathy at C4-5 with bilateral foraminal narrowing. Severe bilateral facet arthropathy at C5-6. At C6-7 there is mild bilateral facet arthropathy, bilateral uncovertebral degenerative changes and bilateral foraminal stenosis. Upper chest: Lung apices are clear.  Centrilobular emphysema. Other: No fluid collection or hematoma. IMPRESSION: 1. No acute intracranial pathology. 2.  No acute osseous injury of the cervical spine. 3.  Emphysema (ICD10-J43.9). Electronically Signed   By: Kathreen Devoid M.D.   On: 04/07/2021 16:27   CT PELVIS WO CONTRAST  Result Date: 04/08/2021 CLINICAL DATA:  [name], right superior and inferior rami fractures EXAM: CT PELVIS WITHOUT CONTRAST TECHNIQUE: Multidetector CT imaging of the pelvis was performed following the standard protocol without intravenous contrast. RADIATION DOSE REDUCTION: This exam was performed according to the departmental dose-optimization program which includes automated exposure control, adjustment of the mA and/or kV according to patient size and/or use of iterative reconstruction technique. COMPARISON:  04/07/2021 FINDINGS: Urinary Tract:  Distal ureters and bladder are unremarkable. Bowel: No bowel obstruction or ileus. Diffuse sigmoid diverticulosis without diverticulitis. Vascular/Lymphatic: Extensive atherosclerosis of the aorta and its distal branches. No pathologic adenopathy. Reproductive:  No pelvic masses. Other: No free intraperitoneal fluid or free gas. Small right inguinal hernia contains mesenteric fat and a small amount of fluid. No bowel herniation. Musculoskeletal: There is a comminuted displaced fracture through the right superior pubic ramus extending to the pubic symphysis. Comminuted displaced right inferior pubic ramus fracture is also noted. Inferior rami fractures are separated by proximally 1 cm. There is also a minimally displaced right sacral ale are  fracture, not visible by plain film due to overlying structures and underlying osteopenia. Minimally displaced fracture of the right L5 transverse process is also noted, also occult on the corresponding x-ray. The bones are diffusely osteopenic. Mild symmetrical bilateral hip osteoarthritis. No evidence of hip fracture. IMPRESSION: 1. Comminuted displaced right superior and inferior pubic rami fractures. 2. Minimally displaced fractures the right L5 transverse process and right sacral ala, not visible on corresponding x-ray due to osteopenia and overlying structures. 3. Severe osteopenia. 4.  Aortic Atherosclerosis (ICD10-I70.0). 5. Sigmoid diverticulosis without diverticulitis. Electronically Signed   By: Randa Ngo M.D.   On: 04/08/2021 18:59   DG Knee Complete 4 Views Right  Result Date: 04/07/2021 CLINICAL DATA:  Found down after fall yesterday, right hip pain EXAM: RIGHT KNEE - COMPLETE 4+ VIEW COMPARISON:  None. FINDINGS: Frontal, bilateral oblique, lateral views of the right knee are obtained. No acute fracture, subluxation, or dislocation. Bones are diffusely osteopenic. There is 3 compartmental osteoarthritis, with moderate joint space narrowing and osteophyte formation most pronounced in the lateral compartment. Small joint effusion likely reactive. Diffuse vascular calcifications. Soft tissues are otherwise unremarkable. IMPRESSION: 1. Moderate 3 compartmental osteoarthritis greatest in the lateral compartment. 2. Osteopenia. 3. Small joint effusion. 4. No acute fracture. Electronically Signed   By: Randa Ngo M.D.   On: 04/07/2021 16:43   DG Hip Unilat W or Wo Pelvis 2-3 Views Right  Result Date: 04/07/2021 CLINICAL DATA:  Found down after fall yesterday, right-sided hip pain EXAM: DG HIP (WITH OR WITHOUT PELVIS) 2-3V RIGHT COMPARISON:  None. FINDINGS: Frontal view of the pelvis as well as frontal and frogleg lateral views of the right hip are obtained. There are comminuted displaced  fractures through the right superior and inferior pubic rami, with moderate displacement. No other acute displaced fractures. Symmetrical bilateral hip osteoarthritis. Sacroiliac joints are unremarkable. Mild spondylosis and facet hypertrophy at the lumbosacral junction. IMPRESSION: 1. Comminuted moderately displaced right superior and inferior pubic rami fractures. 2. Bilateral hip osteoarthritis. 3. Lower lumbar spondylosis and facet hypertrophy. Electronically Signed   By: Randa Ngo M.D.   On: 04/07/2021 16:42    Review of Systems  HENT:  Negative for ear discharge, ear pain, hearing loss and tinnitus.   Eyes:  Negative for photophobia and pain.  Respiratory:  Negative for cough and shortness of breath.   Cardiovascular:  Negative for chest pain.  Gastrointestinal:  Negative for abdominal pain, nausea and vomiting.  Genitourinary:  Negative for dysuria, flank pain, frequency and urgency.  Musculoskeletal:  Positive for arthralgias (Right back/groin). Negative for back pain, myalgias and neck pain.  Neurological:  Negative for dizziness and headaches.  Hematological:  Does not bruise/bleed easily.  Psychiatric/Behavioral:  The patient is not nervous/anxious.   Blood pressure (!) 152/80, pulse 68, temperature 98 F (36.7 C), resp. rate 17, weight 56.5 kg, SpO2 95 %. Physical Exam Constitutional:      General: She is not in acute distress.    Appearance: She is well-developed. She is not diaphoretic.  HENT:     Head: Normocephalic and atraumatic.  Eyes:     General: No scleral icterus.       Right eye: No discharge.        Left eye: No discharge.     Conjunctiva/sclera: Conjunctivae normal.  Cardiovascular:     Rate and Rhythm: Normal rate and regular rhythm.  Pulmonary:     Effort: Pulmonary effort is normal. No respiratory distress.  Musculoskeletal:     Cervical back: Normal range of motion.     Comments: Pelvis--no traumatic wounds or rash, no ecchymosis, stable to manual  stress, mild pain with AP/lat compression  BLE No traumatic wounds, ecchymosis, or rash  Nontender  No knee or ankle effusion  Knee stable to varus/ valgus and anterior/posterior stress  Sens DPN, SPN, TN intact  Motor EHL, ext, flex, evers 5/5  DP 1+, PT 1+, No significant edema  Skin:    General: Skin is warm and dry.  Neurological:     Mental Status: She is alert.  Psychiatric:        Mood and Affect: Mood normal.        Behavior: Behavior normal.  Assessment/Plan: Pelvic fxs -- Pt may be WBAT BLE. F/u in 3 weeks with Dr. Kathaleen Bury. Multiple medical problems including paroxysmal atrial fibrillation chronically anticoagulated on Xarelto complicated by sick sinus syndrome status post pacemaker placement, chronic systolic heart failure in the setting of suspected ischemic cardiomyopathy with LVEF 45 to 50%, essential hypertension, and acquired hypothyroidism -- per primary service    Lisette Abu, PA-C Orthopedic Surgery [phone] 04/09/2021, 9:25 AM

## 2021-04-09 NOTE — Progress Notes (Signed)
Mobility Specialist Progress Note:   04/09/21 1500  Mobility  Activity Moved into chair position in bed  Activity Response Tolerated fair  Transport method Bed  $Mobility charge 1 Mobility   Pt states she is uncomfortable in bed, requesting assistance. Pt right knee found to bed turned proximally, causing significant pain. Turned knee straight with pad in between, slid pt up in bed and placed bed in chair position. Pt left with bed alarm on, and all needs met.   Nelta Numbers Mobility Specialist  Phone [phone]

## 2021-04-09 NOTE — Progress Notes (Addendum)
Subjective: Patient reports pain as mild to moderate. She is worried about managing on her own as she lives by herself. She is eager to go to rehab facility so she can recover.  Objective:   VITALS:  Temp:  [97.5 F (36.4 C)-99.1 F (37.3 C)] 99.1 F (37.3 C) (02/02 2226) Pulse Rate:  [56-71] 71 (02/02 2226) Resp:  [16-18] 16 (02/02 2226) BP: (139-152)/(67-82) 144/70 (02/02 2226) SpO2:  [93 %-99 %] 96 % (02/02 2226) Weight:  [56.5 kg] 56.5 kg (02/02 0307)  AAOx3, NAD Breathing room air comfortably at rest  BLE: Neurovascular intact Sensation intact distally Intact pulses distally Dorsiflexion/Plantar flexion intact Compartment soft   LABS Recent Labs    04/07/21 1548 04/08/21 0414  HGB 11.4* 10.9*  WBC 9.7 9.5  PLT 157 146*   Recent Labs    04/07/21 1548 04/08/21 0414  NA 143 142  K 4.1 3.5  CL 110 112*  CO2 24 21*  BUN 21 20  CREATININE 1.10* 0.99  GLUCOSE 116* 113*   No results for input(s): LABPT, INR in the last 72 hours.   Assessment/Plan:  88 y.o. female with medical history significant for paroxysmal atrial fibrillation chronically anticoagulated on Xarelto complicated by sick sinus syndrome status post pacemaker placement, chronic systolic heart failure in the setting of suspected ischemic cardiomyopathy with LVEF 45 to 50%, essential hypertension, acquired hypothyroidism, who is admitted to [facility] on 04/07/2021 with commuted moderately displaced right superior and inferior pubic rami fractures and non displaced fractures of the right L5 transverse process and right sacral ala  WBAT progress gradually with walker and 1-2 person assist Reviewed imaging with Ortho trauma team Pain control, VTE ppx per primary team Advance diet Up with therapy Discharge to SNF Follow up as outpatient in 3 weeks for repeat films  Armond Hang 04/09/2021, 11:49 PM

## 2021-04-09 NOTE — TOC Progression Note (Signed)
Transition of Care [facility]) - Progression Note    Patient Details  Name: KLAIRA PESCI MRN: [id number] Date of Birth: [birth year]  Transition of Care [facility]) CM/SW [place], [place] Phone Number: 04/09/2021, 5:48 PM  Clinical Narrative:     CSW met with patient at bedside. CSW introduced self and explained role. Patient states she was not sure of which facility - she requested CSW contact her friend, Netta Cedars. Bobbie choose [employer] or [facility] [facility].   CSW sent message to [facility]- they will review tomorrow morning [place] -sent message - waiting on response RN- updated- requested covid test   TOC will continue to follow and assist with discharge planning.  Thurmond Butts, MSW, LCSW Clinical Social Worker    Expected Discharge Plan: Skilled Nursing Facility Barriers to Discharge: Continued Medical Work up  Expected Discharge Plan and Services Expected Discharge Plan: [place]       Living arrangements for the past 2 months: Apartment                                       Social Determinants of Health (SDOH) Interventions    Readmission Risk Interventions No flowsheet data found.

## 2021-04-09 NOTE — Progress Notes (Signed)
PROGRESS NOTE    [name]  YSA:[id number] DOB: 02/08/1933 DOA: 04/07/2021 PCP: Wardell Honour, MD    Brief Narrative:  [name] is a 88 y.o. female with medical history significant for paroxysmal atrial fibrillation chronically anticoagulated on Xarelto complicated by sick sinus syndrome status post pacemaker placement, chronic systolic heart failure in the setting of suspected ischemic cardiomyopathy with LVEF 45 to 50%, essential hypertension, acquired hypothyroidism, who is admitted to [facility] on 04/07/2021 with commuted moderately displaced right superior and inferior pubic rami fractures after presenting from home to [facility] ED complaining of right posterior hip pain.        Consultants:  orthopedics  Procedures:   Antimicrobials:      Subjective: Has no pain this am. Denies sob, cp, dizziness  Objective: Vitals:   04/09/21 0001 04/09/21 0307 04/09/21 0420 04/09/21 0940  BP: (!) 150/82  (!) 152/80 (!) 144/81  Pulse: 71  68 62  Resp: 18  17 18   Temp: 98.3 F (36.8 C)  98 F (36.7 C) (!) 97.5 F (36.4 C)  TempSrc: Axillary   Oral  SpO2: 93%  95% 94%  Weight:  56.5 kg      Intake/Output Summary (Last 24 hours) at 04/09/2021 1312 Last data filed at 04/08/2021 2129 Gross per 24 hour  Intake --  Output 300 ml  Net -300 ml   Filed Weights   04/09/21 0307  Weight: 56.5 kg    Examination:  General exam: Appears calm and comfortable  Respiratory system: Clear to auscultation. Respiratory effort normal. Cardiovascular system: S1 & S2 heard, RRR. No gallops  Gastrointestinal system: Abdomen is nondistended, soft and nontender. Normal bowel sounds heard. Central nervous system: Alert and oriented.  Grossly intact  extremities: No edema Psychiatry:  Mood & affect appropriate.     Data Reviewed: I have personally reviewed following labs and imaging studies  CBC: Recent Labs  Lab 04/07/21 1548 04/08/21 0414  WBC 9.7 9.5  NEUTROABS 7.9*  7.5  HGB 11.4* 10.9*  HCT 34.7* 32.3*  MCV 94.0 94.2  PLT 157 323*   Basic Metabolic Panel: Recent Labs  Lab 04/07/21 1548 04/08/21 0414  NA 143 142  K 4.1 3.5  CL 110 112*  CO2 24 21*  GLUCOSE 116* 113*  BUN 21 20  CREATININE 1.10* 0.99  CALCIUM 9.3 9.0  MG  --  2.0   GFR: Estimated Creatinine Clearance: 36.4 mL/min (by C-G formula based on SCr of 0.99 mg/dL). Liver Function Tests: Recent Labs  Lab 04/07/21 1548 04/08/21 0414  AST 53* 58*  ALT 24 25  ALKPHOS 67 58  BILITOT 1.3* 1.1  PROT 5.9* 5.3*  ALBUMIN 3.5 3.1*   No results for input(s): LIPASE, AMYLASE in the last 168 hours. No results for input(s): AMMONIA in the last 168 hours. Coagulation Profile: No results for input(s): INR, PROTIME in the last 168 hours. Cardiac Enzymes: No results for input(s): CKTOTAL, CKMB, CKMBINDEX, TROPONINI in the last 168 hours. BNP (last 3 results) No results for input(s): PROBNP in the last 8760 hours. HbA1C: No results for input(s): HGBA1C in the last 72 hours. CBG: No results for input(s): GLUCAP in the last 168 hours. Lipid Profile: No results for input(s): CHOL, HDL, LDLCALC, TRIG, CHOLHDL, LDLDIRECT in the last 72 hours. Thyroid Function Tests: No results for input(s): TSH, T4TOTAL, FREET4, T3FREE, THYROIDAB in the last 72 hours. Anemia Panel: No results for input(s): VITAMINB12, FOLATE, FERRITIN, TIBC, IRON, RETICCTPCT in the last  72 hours. Sepsis Labs: No results for input(s): PROCALCITON, LATICACIDVEN in the last 168 hours.  Recent Results (from the past 240 hour(s))  Resp Panel by RT-PCR (Flu A&B, Covid) Nasopharyngeal Swab     Status: None   Collection Time: 04/07/21  7:34 PM   Specimen: Nasopharyngeal Swab; Nasopharyngeal(NP) swabs in vial transport medium  Result Value Ref Range Status   SARS Coronavirus 2 by RT PCR NEGATIVE NEGATIVE Final    Comment: (NOTE) SARS-CoV-2 target nucleic acids are NOT DETECTED.  The SARS-CoV-2 RNA is generally detectable  in upper respiratory specimens during the acute phase of infection. The lowest concentration of SARS-CoV-2 viral copies this assay can detect is 138 copies/mL. A negative result does not preclude SARS-Cov-2 infection and should not be used as the sole basis for treatment or other patient management decisions. A negative result may occur with  improper specimen collection/handling, submission of specimen other than nasopharyngeal swab, presence of viral mutation(s) within the areas targeted by this assay, and inadequate number of viral copies(<138 copies/mL). A negative result must be combined with clinical observations, patient history, and epidemiological information. The expected result is Negative.  Fact Sheet for Patients:  EntrepreneurPulse.com.au  Fact Sheet for Healthcare Providers:  IncredibleEmployment.be  This test is no t yet approved or cleared by the [place] FDA and  has been authorized for detection and/or diagnosis of SARS-CoV-2 by FDA under an Emergency Use Authorization (EUA). This EUA will remain  in effect (meaning this test can be used) for the duration of the COVID-19 declaration under Section 564(b)(1) of the Act, 21 U.S.C.section 360bbb-3(b)(1), unless the authorization is terminated  or revoked sooner.       Influenza A by PCR NEGATIVE NEGATIVE Final   Influenza B by PCR NEGATIVE NEGATIVE Final    Comment: (NOTE) The Xpert Xpress SARS-CoV-2/FLU/RSV plus assay is intended as an aid in the diagnosis of influenza from Nasopharyngeal swab specimens and should not be used as a sole basis for treatment. Nasal washings and aspirates are unacceptable for Xpert Xpress SARS-CoV-2/FLU/RSV testing.  Fact Sheet for Patients: EntrepreneurPulse.com.au  Fact Sheet for Healthcare Providers: IncredibleEmployment.be  This test is not yet approved or cleared by the [place] FDA and has  been authorized for detection and/or diagnosis of SARS-CoV-2 by FDA under an Emergency Use Authorization (EUA). This EUA will remain in effect (meaning this test can be used) for the duration of the COVID-19 declaration under Section 564(b)(1) of the Act, 21 U.S.C. section 360bbb-3(b)(1), unless the authorization is terminated or revoked.  Performed at [facility], [place] [address], [place], [place] [address]          Radiology Studies: CT Head Wo Contrast  Result Date: 04/07/2021 CLINICAL DATA:  Status post fall, neck trauma EXAM: CT HEAD WITHOUT CONTRAST CT CERVICAL SPINE WITHOUT CONTRAST TECHNIQUE: Multidetector CT imaging of the head and cervical spine was performed following the standard protocol without intravenous contrast. Multiplanar CT image reconstructions of the cervical spine were also generated. RADIATION DOSE REDUCTION: This exam was performed according to the departmental dose-optimization program which includes automated exposure control, adjustment of the mA and/or kV according to patient size and/or use of iterative reconstruction technique. COMPARISON:  None. FINDINGS: Brain: No evidence of acute infarction, hemorrhage, extra-axial collection, ventriculomegaly, or mass effect. Generalized cerebral atrophy. Periventricular white matter low attenuation likely secondary to microangiopathy. Vascular: Cerebrovascular atherosclerotic calcifications are noted. No hyperdense vessels. Skull: Negative for fracture or focal lesion. Sinuses/Orbits: Visualized portions of the  orbits are unremarkable. Visualized portions of the paranasal sinuses are unremarkable. Visualized portions of the mastoid air cells are unremarkable. Other: None. CT CERVICAL SPINE FINDINGS Alignment: Normal. Skull base and vertebrae: No acute fracture. No primary bone lesion or focal pathologic process. Soft tissues and spinal canal: No prevertebral fluid or swelling. No visible canal hematoma. Disc levels:  Degenerative disease with disc height loss at C6-7 with a broad-based disc osteophyte complex. Moderate bilateral facet arthropathy at C2-3. Severe bilateral facet arthropathy at C3-4. Severe bilateral facet arthropathy at C4-5 with bilateral foraminal narrowing. Severe bilateral facet arthropathy at C5-6. At C6-7 there is mild bilateral facet arthropathy, bilateral uncovertebral degenerative changes and bilateral foraminal stenosis. Upper chest: Lung apices are clear.  Centrilobular emphysema. Other: No fluid collection or hematoma. IMPRESSION: 1. No acute intracranial pathology. 2.  No acute osseous injury of the cervical spine. 3.  Emphysema (ICD10-J43.9). Electronically Signed   By: Kathreen Devoid M.D.   On: 04/07/2021 16:27   CT Cervical Spine Wo Contrast  Result Date: 04/07/2021 CLINICAL DATA:  Status post fall, neck trauma EXAM: CT HEAD WITHOUT CONTRAST CT CERVICAL SPINE WITHOUT CONTRAST TECHNIQUE: Multidetector CT imaging of the head and cervical spine was performed following the standard protocol without intravenous contrast. Multiplanar CT image reconstructions of the cervical spine were also generated. RADIATION DOSE REDUCTION: This exam was performed according to the departmental dose-optimization program which includes automated exposure control, adjustment of the mA and/or kV according to patient size and/or use of iterative reconstruction technique. COMPARISON:  None. FINDINGS: Brain: No evidence of acute infarction, hemorrhage, extra-axial collection, ventriculomegaly, or mass effect. Generalized cerebral atrophy. Periventricular white matter low attenuation likely secondary to microangiopathy. Vascular: Cerebrovascular atherosclerotic calcifications are noted. No hyperdense vessels. Skull: Negative for fracture or focal lesion. Sinuses/Orbits: Visualized portions of the orbits are unremarkable. Visualized portions of the paranasal sinuses are unremarkable. Visualized portions of the mastoid air  cells are unremarkable. Other: None. CT CERVICAL SPINE FINDINGS Alignment: Normal. Skull base and vertebrae: No acute fracture. No primary bone lesion or focal pathologic process. Soft tissues and spinal canal: No prevertebral fluid or swelling. No visible canal hematoma. Disc levels: Degenerative disease with disc height loss at C6-7 with a broad-based disc osteophyte complex. Moderate bilateral facet arthropathy at C2-3. Severe bilateral facet arthropathy at C3-4. Severe bilateral facet arthropathy at C4-5 with bilateral foraminal narrowing. Severe bilateral facet arthropathy at C5-6. At C6-7 there is mild bilateral facet arthropathy, bilateral uncovertebral degenerative changes and bilateral foraminal stenosis. Upper chest: Lung apices are clear.  Centrilobular emphysema. Other: No fluid collection or hematoma. IMPRESSION: 1. No acute intracranial pathology. 2.  No acute osseous injury of the cervical spine. 3.  Emphysema (ICD10-J43.9). Electronically Signed   By: Kathreen Devoid M.D.   On: 04/07/2021 16:27   CT PELVIS WO CONTRAST  Result Date: 04/08/2021 CLINICAL DATA:  [name], right superior and inferior rami fractures EXAM: CT PELVIS WITHOUT CONTRAST TECHNIQUE: Multidetector CT imaging of the pelvis was performed following the standard protocol without intravenous contrast. RADIATION DOSE REDUCTION: This exam was performed according to the departmental dose-optimization program which includes automated exposure control, adjustment of the mA and/or kV according to patient size and/or use of iterative reconstruction technique. COMPARISON:  04/07/2021 FINDINGS: Urinary Tract:  Distal ureters and bladder are unremarkable. Bowel: No bowel obstruction or ileus. Diffuse sigmoid diverticulosis without diverticulitis. Vascular/Lymphatic: Extensive atherosclerosis of the aorta and its distal branches. No pathologic adenopathy. Reproductive:  No pelvic masses. Other: No free intraperitoneal fluid or  free gas. Small right  inguinal hernia contains mesenteric fat and a small amount of fluid. No bowel herniation. Musculoskeletal: There is a comminuted displaced fracture through the right superior pubic ramus extending to the pubic symphysis. Comminuted displaced right inferior pubic ramus fracture is also noted. Inferior rami fractures are separated by proximally 1 cm. There is also a minimally displaced right sacral ale are fracture, not visible by plain film due to overlying structures and underlying osteopenia. Minimally displaced fracture of the right L5 transverse process is also noted, also occult on the corresponding x-ray. The bones are diffusely osteopenic. Mild symmetrical bilateral hip osteoarthritis. No evidence of hip fracture. IMPRESSION: 1. Comminuted displaced right superior and inferior pubic rami fractures. 2. Minimally displaced fractures the right L5 transverse process and right sacral ala, not visible on corresponding x-ray due to osteopenia and overlying structures. 3. Severe osteopenia. 4.  Aortic Atherosclerosis (ICD10-I70.0). 5. Sigmoid diverticulosis without diverticulitis. Electronically Signed   By: Randa Ngo M.D.   On: 04/08/2021 18:59   DG Knee Complete 4 Views Right  Result Date: 04/07/2021 CLINICAL DATA:  Found down after fall yesterday, right hip pain EXAM: RIGHT KNEE - COMPLETE 4+ VIEW COMPARISON:  None. FINDINGS: Frontal, bilateral oblique, lateral views of the right knee are obtained. No acute fracture, subluxation, or dislocation. Bones are diffusely osteopenic. There is 3 compartmental osteoarthritis, with moderate joint space narrowing and osteophyte formation most pronounced in the lateral compartment. Small joint effusion likely reactive. Diffuse vascular calcifications. Soft tissues are otherwise unremarkable. IMPRESSION: 1. Moderate 3 compartmental osteoarthritis greatest in the lateral compartment. 2. Osteopenia. 3. Small joint effusion. 4. No acute fracture. Electronically Signed    By: Randa Ngo M.D.   On: 04/07/2021 16:43   DG Hip Unilat W or Wo Pelvis 2-3 Views Right  Result Date: 04/07/2021 CLINICAL DATA:  Found down after fall yesterday, right-sided hip pain EXAM: DG HIP (WITH OR WITHOUT PELVIS) 2-3V RIGHT COMPARISON:  None. FINDINGS: Frontal view of the pelvis as well as frontal and frogleg lateral views of the right hip are obtained. There are comminuted displaced fractures through the right superior and inferior pubic rami, with moderate displacement. No other acute displaced fractures. Symmetrical bilateral hip osteoarthritis. Sacroiliac joints are unremarkable. Mild spondylosis and facet hypertrophy at the lumbosacral junction. IMPRESSION: 1. Comminuted moderately displaced right superior and inferior pubic rami fractures. 2. Bilateral hip osteoarthritis. 3. Lower lumbar spondylosis and facet hypertrophy. Electronically Signed   By: Randa Ngo M.D.   On: 04/07/2021 16:42        Scheduled Meds:  amiodarone  100 mg Oral Daily   furosemide  20 mg Oral Daily   levothyroxine  100 mcg Oral QAC breakfast   lidocaine  1 patch Transdermal Q24H   loratadine  10 mg Oral Daily   losartan  25 mg Oral Daily   metoprolol succinate  25 mg Oral Daily   Rivaroxaban  15 mg Oral Q supper   Continuous Infusions:  Assessment & Plan:   Principal Problem:   Pubic ramus fracture (HCC) Active Problems:   Hypothyroidism   HTN (hypertension)   Persistent atrial fibrillation ([place])   Fall at home, initial encounter   Chronic systolic CHF (congestive heart failure) ([place])   Fracture   #) Acute fractures of the right superior and left inferior pubic rami with moderate displacement: In the setting of ground-level mechanical fall incurred on 04/06/2021 without loss of consciousness and associated with presenting right-sided hip pain, plain films of the  right hip/pelvis showing Acute fractures of the right superior and left inferior pubic rami with moderate displacement.   2/2  Ortho's input was appreciated-WBAT BLE. F/u in 3 weeks with dr. Kathaleen Bury. PT OT recommends SNF      #Vit D deficiency Vitamin D mildly low at 27.45 We will start supplementation     #) Ground level mechanical fall:  CT of the head negative for acute intracranial issues             #) Paroxysmal atrial fibrillation:  Continue beta-blockers, amiodarone and anticoagulation          #) Chronic systolic heart failure: documented history of such, with most recent echocardiogram performed in August 2022, which is notable for LVEF 45 to 50% 2/1 no acute exacerbation Continue ARB, beta blk, lasix         #) Essential Hypertension:  Stable Continue losartan, beta-blockers, furosemide       #) acquired hypothyroidism:  Continue Synthroid        DVT prophylaxis: Xarelto Code Status: Full Family Communication: None at bedside Disposition Plan: SNF  Status is: Inpatient Remains inpatient appropriate because: Unsafe discharge.  Patient needs SNF  Patient medically stable awaiting SNF              LOS: 0 days   Time spent: 35 minutes with more than 50% on [place], MD [facility] Pager [phone]  If 7PM-7AM, please contact night-coverage 04/09/2021, 1:12 PM

## 2021-04-10 DIAGNOSIS — S32501D Unspecified fracture of right pubis, subsequent encounter for fracture with routine healing: Secondary | ICD-10-CM | POA: Diagnosis not present

## 2021-04-10 DIAGNOSIS — Y92009 Unspecified place in unspecified non-institutional (private) residence as the place of occurrence of the external cause: Secondary | ICD-10-CM | POA: Diagnosis not present

## 2021-04-10 DIAGNOSIS — S32591A Other specified fracture of right pubis, initial encounter for closed fracture: Secondary | ICD-10-CM | POA: Diagnosis not present

## 2021-04-10 DIAGNOSIS — E559 Vitamin D deficiency, unspecified: Secondary | ICD-10-CM | POA: Diagnosis not present

## 2021-04-10 DIAGNOSIS — M1711 Unilateral primary osteoarthritis, right knee: Secondary | ICD-10-CM | POA: Diagnosis not present

## 2021-04-10 DIAGNOSIS — S32599D Other specified fracture of unspecified pubis, subsequent encounter for fracture with routine healing: Secondary | ICD-10-CM | POA: Diagnosis not present

## 2021-04-10 DIAGNOSIS — Z7401 Bed confinement status: Secondary | ICD-10-CM | POA: Diagnosis not present

## 2021-04-10 DIAGNOSIS — R531 Weakness: Secondary | ICD-10-CM | POA: Diagnosis not present

## 2021-04-10 DIAGNOSIS — I480 Paroxysmal atrial fibrillation: Secondary | ICD-10-CM | POA: Diagnosis not present

## 2021-04-10 DIAGNOSIS — F419 Anxiety disorder, unspecified: Secondary | ICD-10-CM | POA: Diagnosis not present

## 2021-04-10 DIAGNOSIS — M6281 Muscle weakness (generalized): Secondary | ICD-10-CM | POA: Diagnosis not present

## 2021-04-10 DIAGNOSIS — D649 Anemia, unspecified: Secondary | ICD-10-CM | POA: Diagnosis not present

## 2021-04-10 DIAGNOSIS — I10 Essential (primary) hypertension: Secondary | ICD-10-CM | POA: Diagnosis not present

## 2021-04-10 DIAGNOSIS — I5022 Chronic systolic (congestive) heart failure: Secondary | ICD-10-CM | POA: Diagnosis not present

## 2021-04-10 DIAGNOSIS — G2581 Restless legs syndrome: Secondary | ICD-10-CM | POA: Diagnosis not present

## 2021-04-10 DIAGNOSIS — E039 Hypothyroidism, unspecified: Secondary | ICD-10-CM | POA: Diagnosis not present

## 2021-04-10 DIAGNOSIS — G4700 Insomnia, unspecified: Secondary | ICD-10-CM | POA: Diagnosis not present

## 2021-04-10 DIAGNOSIS — M1990 Unspecified osteoarthritis, unspecified site: Secondary | ICD-10-CM | POA: Diagnosis not present

## 2021-04-10 DIAGNOSIS — M25561 Pain in right knee: Secondary | ICD-10-CM | POA: Diagnosis not present

## 2021-04-10 DIAGNOSIS — Z9181 History of falling: Secondary | ICD-10-CM | POA: Diagnosis not present

## 2021-04-10 DIAGNOSIS — J329 Chronic sinusitis, unspecified: Secondary | ICD-10-CM | POA: Diagnosis not present

## 2021-04-10 DIAGNOSIS — E538 Deficiency of other specified B group vitamins: Secondary | ICD-10-CM | POA: Diagnosis not present

## 2021-04-10 DIAGNOSIS — K219 Gastro-esophageal reflux disease without esophagitis: Secondary | ICD-10-CM | POA: Diagnosis not present

## 2021-04-10 DIAGNOSIS — N1831 Chronic kidney disease, stage 3a: Secondary | ICD-10-CM | POA: Diagnosis not present

## 2021-04-10 DIAGNOSIS — F331 Major depressive disorder, recurrent, moderate: Secondary | ICD-10-CM | POA: Diagnosis not present

## 2021-04-10 DIAGNOSIS — T148XXA Other injury of unspecified body region, initial encounter: Secondary | ICD-10-CM | POA: Diagnosis not present

## 2021-04-10 DIAGNOSIS — W19XXXA Unspecified fall, initial encounter: Secondary | ICD-10-CM | POA: Diagnosis not present

## 2021-04-10 DIAGNOSIS — R2689 Other abnormalities of gait and mobility: Secondary | ICD-10-CM | POA: Diagnosis not present

## 2021-04-10 DIAGNOSIS — E785 Hyperlipidemia, unspecified: Secondary | ICD-10-CM | POA: Diagnosis not present

## 2021-04-10 DIAGNOSIS — Z7689 Persons encountering health services in other specified circumstances: Secondary | ICD-10-CM | POA: Diagnosis not present

## 2021-04-10 DIAGNOSIS — S32501S Unspecified fracture of right pubis, sequela: Secondary | ICD-10-CM | POA: Diagnosis not present

## 2021-04-10 DIAGNOSIS — E860 Dehydration: Secondary | ICD-10-CM | POA: Diagnosis not present

## 2021-04-10 LAB — CBC
HCT: 32.5 % — ABNORMAL LOW (ref 36.0–46.0)
Hemoglobin: 10.8 g/dL — ABNORMAL LOW (ref 12.0–15.0)
MCH: 30.9 pg (ref 26.0–34.0)
MCHC: 33.2 g/dL (ref 30.0–36.0)
MCV: 92.9 fL (ref 80.0–100.0)
Platelets: 163 10*3/uL (ref 150–400)
RBC: 3.5 MIL/uL — ABNORMAL LOW (ref 3.87–5.11)
RDW: 13.2 % (ref 11.5–15.5)
WBC: 9.9 10*3/uL (ref 4.0–10.5)
nRBC: 0 % (ref 0.0–0.2)

## 2021-04-10 LAB — RESP PANEL BY RT-PCR (FLU A&B, COVID) ARPGX2
Influenza A by PCR: NEGATIVE
Influenza B by PCR: NEGATIVE
SARS Coronavirus 2 by RT PCR: NEGATIVE

## 2021-04-10 MED ORDER — VITAMIN D (ERGOCALCIFEROL) 1.25 MG (50000 UNIT) PO CAPS: 50000.0000 [IU] | ORAL_CAPSULE | ORAL | Status: DC

## 2021-04-10 MED ORDER — FUROSEMIDE 20 MG PO TABS: 20.0000 mg | ORAL_TABLET | Freq: Every day | ORAL | Status: DC

## 2021-04-10 MED ORDER — LIDOCAINE 5 % EX PTCH: 1.0000 | MEDICATED_PATCH | CUTANEOUS | 0 refills | Status: DC

## 2021-04-10 NOTE — Discharge Summary (Signed)
[name] OZH:[id number] DOB: [birth year] DOA: 04/07/2021  PCP: Wardell Honour, MD  Admit date: 04/07/2021 Discharge date: 04/10/2021  Admitted From: home Disposition:  SNF  Recommendations for Outpatient Follow-up:  Follow up with PCP in 1 week Please obtain BMP/CBC in one week Please follow up With Dr. Kathaleen Bury orthopedics in 3 weeks     Discharge Condition:Stable CODE STATUS:full  Diet recommendation: Regular   Brief/Interim Summary: Per HPI: [name] is a 88 y.o. female with medical history significant for paroxysmal atrial fibrillation chronically anticoagulated on Xarelto complicated by sick sinus syndrome status post pacemaker placement, chronic systolic heart failure in the setting of suspected ischemic cardiomyopathy with LVEF 45 to 50%, essential hypertension, acquired hypothyroidism, who is admitted to [facility] on 04/07/2021 with commuted moderately displaced right superior and inferior pubic rami fractures after presenting from home to [facility] ED complaining of right posterior hip pain.    #) Acute fractures of the right superior and left inferior pubic rami with moderate displacement: In the setting of ground-level mechanical fall incurred on 04/06/2021 without loss of consciousness and associated with presenting right-sided hip pain, plain films of the right hip/pelvis showing Acute fractures of the right superior and left inferior pubic rami with moderate displacement.   Orthopedics consulted: WBAT progress gradually with walker and 1-2 person assist Discharge to SNF Follow up as outpatient in 3 weeks for repeat films          #Vit D deficiency Vitamin D mildly low at 27.45 Started on supplementation       #) Ground level mechanical fall:  CT of the head negative for acute intracranial issues                #) Paroxysmal atrial fibrillation:  Continue beta-blockers, amiodarone and anticoagulation            #) Chronic systolic  heart failure: documented history of such, with most recent echocardiogram performed in August 2022, which is notable for LVEF 45 to 50% no acute exacerbation Continue ARB, beta blk, lasix         #) Essential Hypertension:  Stable Continue losartan, beta-blockers, furosemide       #) acquired hypothyroidism:  Continue Synthroid    Discharge Diagnoses:  Principal Problem:   Pubic ramus fracture ([place]) Active Problems:   Hypothyroidism   HTN (hypertension)   Persistent atrial fibrillation ([place])   Fall at home, initial encounter   Chronic systolic CHF (congestive heart failure) ([place])   Fracture    Discharge Instructions  Discharge Instructions     Diet - low sodium heart healthy   Complete by: As directed    Increase activity slowly   Complete by: As directed       Allergies as of 04/10/2021       Reactions   Hydrocodone Itching   Cephalexin Itching, Swelling   Ciprofloxacin Other (See Comments)   Not indicated due to aneurysm in aorta     Doxycycline Other (See Comments)   Unknown reaction   Escitalopram Other (See Comments)   Dry mouth   Levofloxacin Other (See Comments)   Not indicated due to aneurysm in aorta    Moxifloxacin Other (See Comments)   Not indicated due to aneurysm in aorta    Ofloxacin Other (See Comments)   Unknown reaction   Sertraline Other (See Comments)   insomnia   Sulfamethoxazole Hives   Any sulfa meds.   Tape Other (See Comments)   [place]  skin.    Latex Rash   "If pt. Makes contact with or wearing"   Neomycin Rash        Medication List     STOP taking these medications    escitalopram 5 MG tablet Commonly known as: Lexapro       TAKE these medications    acetaminophen 500 MG tablet Commonly known as: TYLENOL Take 1,000 mg by mouth every 8 (eight) hours as needed for moderate pain or mild pain.   amiodarone 200 MG tablet Commonly known as: PACERONE Take 0.5 tablets (100 mg total) by mouth daily.   Biotin  5000 MCG Tabs Take 5,000 mcg by mouth daily.   CALCIUM + D PO Take 2 tablets by mouth daily. 1000 units Vitamin D 1200 mg calcium   estradiol 0.1 MG/GM vaginal cream Commonly known as: ESTRACE Place 1 g vaginally 2 (two) times a week.   fexofenadine 180 MG tablet Commonly known as: ALLEGRA Take 180 mg by mouth daily.   furosemide 20 MG tablet Commonly known as: LASIX Take 1 tablet (20 mg total) by mouth daily. Start taking on: April 11, 2021 What changed:  medication strength how much to take how to take this when to take this additional instructions   lidocaine 5 % Commonly known as: LIDODERM Place 1 patch onto the skin daily. Remove & Discard patch within 12 hours or as directed by MD   losartan 25 MG tablet Commonly known as: COZAAR TAKE 1 TABLET BY MOUTH EVERY DAY   metoprolol succinate 25 MG 24 hr tablet Commonly known as: TOPROL-XL TAKE 1 TABLET BY MOUTH EVERY DAY   RABEprazole 20 MG tablet Commonly known as: ACIPHEX Take one tablet by mouth once daily for stomach   Synthroid 100 MCG tablet Generic drug: levothyroxine TAKE 1 TABLET BY MOUTH DAILY BEFORE BREAKFAST. What changed: how much to take   Systane 0.4-0.3 % Soln Generic drug: Polyethyl Glycol-Propyl Glycol Place 1 drop into both eyes daily as needed (dry eyes).   Vitamin B 12 500 MCG Tabs Take 1,000 mcg by mouth daily.   vitamin C 1000 MG tablet Take 1,000 mg by mouth at bedtime. Ester C   Vitamin D (Ergocalciferol) 1.25 MG (50000 UNIT) Caps capsule Commonly known as: DRISDOL Take 1 capsule (50,000 Units total) by mouth every 7 (seven) days. Start taking on: April 16, 2021   Xarelto 15 MG Tabs tablet Generic drug: Rivaroxaban TAKE 1 TABLET DAILY WITH   SUPPER (DOSE REDUCED) What changed: See the new instructions.        Follow-up Information     Armond Hang, MD Follow up in 3 week(s).   Specialty: Orthopedic Surgery Contact information: [address], [address]  [place] [address] [phone]         Wardell Honour, MD Follow up in 1 week(s).   Specialties: Family Medicine, Emergency Medicine Contact information: [place] [address] [phone]                Allergies  Allergen Reactions   Hydrocodone Itching   Cephalexin Itching and Swelling   Ciprofloxacin Other (See Comments)    Not indicated due to aneurysm in aorta     Doxycycline Other (See Comments)    Unknown reaction   Escitalopram Other (See Comments)    Dry mouth   Levofloxacin Other (See Comments)    Not indicated due to aneurysm in aorta    Moxifloxacin Other (See Comments)  Not indicated due to aneurysm in aorta    Ofloxacin Other (See Comments)    Unknown reaction   Sertraline Other (See Comments)    insomnia   Sulfamethoxazole Hives    Any sulfa meds.   Tape Other (See Comments)    [place] skin.    Latex Rash    "If pt. Makes contact with or wearing"   Neomycin Rash    Consultations: Orthopedics   Procedures/Studies: CT Head Wo Contrast  Result Date: 04/07/2021 CLINICAL DATA:  Status post fall, neck trauma EXAM: CT HEAD WITHOUT CONTRAST CT CERVICAL SPINE WITHOUT CONTRAST TECHNIQUE: Multidetector CT imaging of the head and cervical spine was performed following the standard protocol without intravenous contrast. Multiplanar CT image reconstructions of the cervical spine were also generated. RADIATION DOSE REDUCTION: This exam was performed according to the departmental dose-optimization program which includes automated exposure control, adjustment of the mA and/or kV according to patient size and/or use of iterative reconstruction technique. COMPARISON:  None. FINDINGS: Brain: No evidence of acute infarction, hemorrhage, extra-axial collection, ventriculomegaly, or mass effect. Generalized cerebral atrophy. Periventricular white matter low attenuation likely secondary to microangiopathy. Vascular: Cerebrovascular  atherosclerotic calcifications are noted. No hyperdense vessels. Skull: Negative for fracture or focal lesion. Sinuses/Orbits: Visualized portions of the orbits are unremarkable. Visualized portions of the paranasal sinuses are unremarkable. Visualized portions of the mastoid air cells are unremarkable. Other: None. CT CERVICAL SPINE FINDINGS Alignment: Normal. Skull base and vertebrae: No acute fracture. No primary bone lesion or focal pathologic process. Soft tissues and spinal canal: No prevertebral fluid or swelling. No visible canal hematoma. Disc levels: Degenerative disease with disc height loss at C6-7 with a broad-based disc osteophyte complex. Moderate bilateral facet arthropathy at C2-3. Severe bilateral facet arthropathy at C3-4. Severe bilateral facet arthropathy at C4-5 with bilateral foraminal narrowing. Severe bilateral facet arthropathy at C5-6. At C6-7 there is mild bilateral facet arthropathy, bilateral uncovertebral degenerative changes and bilateral foraminal stenosis. Upper chest: Lung apices are clear.  Centrilobular emphysema. Other: No fluid collection or hematoma. IMPRESSION: 1. No acute intracranial pathology. 2.  No acute osseous injury of the cervical spine. 3.  Emphysema (ICD10-J43.9). Electronically Signed   By: Kathreen Devoid M.D.   On: 04/07/2021 16:27   CT Cervical Spine Wo Contrast  Result Date: 04/07/2021 CLINICAL DATA:  Status post fall, neck trauma EXAM: CT HEAD WITHOUT CONTRAST CT CERVICAL SPINE WITHOUT CONTRAST TECHNIQUE: Multidetector CT imaging of the head and cervical spine was performed following the standard protocol without intravenous contrast. Multiplanar CT image reconstructions of the cervical spine were also generated. RADIATION DOSE REDUCTION: This exam was performed according to the departmental dose-optimization program which includes automated exposure control, adjustment of the mA and/or kV according to patient size and/or use of iterative reconstruction  technique. COMPARISON:  None. FINDINGS: Brain: No evidence of acute infarction, hemorrhage, extra-axial collection, ventriculomegaly, or mass effect. Generalized cerebral atrophy. Periventricular white matter low attenuation likely secondary to microangiopathy. Vascular: Cerebrovascular atherosclerotic calcifications are noted. No hyperdense vessels. Skull: Negative for fracture or focal lesion. Sinuses/Orbits: Visualized portions of the orbits are unremarkable. Visualized portions of the paranasal sinuses are unremarkable. Visualized portions of the mastoid air cells are unremarkable. Other: None. CT CERVICAL SPINE FINDINGS Alignment: Normal. Skull base and vertebrae: No acute fracture. No primary bone lesion or focal pathologic process. Soft tissues and spinal canal: No prevertebral fluid or swelling. No visible canal hematoma. Disc levels: Degenerative disease with disc height loss at C6-7 with a broad-based disc osteophyte  complex. Moderate bilateral facet arthropathy at C2-3. Severe bilateral facet arthropathy at C3-4. Severe bilateral facet arthropathy at C4-5 with bilateral foraminal narrowing. Severe bilateral facet arthropathy at C5-6. At C6-7 there is mild bilateral facet arthropathy, bilateral uncovertebral degenerative changes and bilateral foraminal stenosis. Upper chest: Lung apices are clear.  Centrilobular emphysema. Other: No fluid collection or hematoma. IMPRESSION: 1. No acute intracranial pathology. 2.  No acute osseous injury of the cervical spine. 3.  Emphysema (ICD10-J43.9). Electronically Signed   By: Kathreen Devoid M.D.   On: 04/07/2021 16:27   CT PELVIS WO CONTRAST  Result Date: 04/08/2021 CLINICAL DATA:  [name], right superior and inferior rami fractures EXAM: CT PELVIS WITHOUT CONTRAST TECHNIQUE: Multidetector CT imaging of the pelvis was performed following the standard protocol without intravenous contrast. RADIATION DOSE REDUCTION: This exam was performed according to the departmental  dose-optimization program which includes automated exposure control, adjustment of the mA and/or kV according to patient size and/or use of iterative reconstruction technique. COMPARISON:  04/07/2021 FINDINGS: Urinary Tract:  Distal ureters and bladder are unremarkable. Bowel: No bowel obstruction or ileus. Diffuse sigmoid diverticulosis without diverticulitis. Vascular/Lymphatic: Extensive atherosclerosis of the aorta and its distal branches. No pathologic adenopathy. Reproductive:  No pelvic masses. Other: No free intraperitoneal fluid or free gas. Small right inguinal hernia contains mesenteric fat and a small amount of fluid. No bowel herniation. Musculoskeletal: There is a comminuted displaced fracture through the right superior pubic ramus extending to the pubic symphysis. Comminuted displaced right inferior pubic ramus fracture is also noted. Inferior rami fractures are separated by proximally 1 cm. There is also a minimally displaced right sacral ale are fracture, not visible by plain film due to overlying structures and underlying osteopenia. Minimally displaced fracture of the right L5 transverse process is also noted, also occult on the corresponding x-ray. The bones are diffusely osteopenic. Mild symmetrical bilateral hip osteoarthritis. No evidence of hip fracture. IMPRESSION: 1. Comminuted displaced right superior and inferior pubic rami fractures. 2. Minimally displaced fractures the right L5 transverse process and right sacral ala, not visible on corresponding x-ray due to osteopenia and overlying structures. 3. Severe osteopenia. 4.  Aortic Atherosclerosis (ICD10-I70.0). 5. Sigmoid diverticulosis without diverticulitis. Electronically Signed   By: Randa Ngo M.D.   On: 04/08/2021 18:59   DG Knee Complete 4 Views Right  Result Date: 04/07/2021 CLINICAL DATA:  Found down after fall yesterday, right hip pain EXAM: RIGHT KNEE - COMPLETE 4+ VIEW COMPARISON:  None. FINDINGS: Frontal, bilateral  oblique, lateral views of the right knee are obtained. No acute fracture, subluxation, or dislocation. Bones are diffusely osteopenic. There is 3 compartmental osteoarthritis, with moderate joint space narrowing and osteophyte formation most pronounced in the lateral compartment. Small joint effusion likely reactive. Diffuse vascular calcifications. Soft tissues are otherwise unremarkable. IMPRESSION: 1. Moderate 3 compartmental osteoarthritis greatest in the lateral compartment. 2. Osteopenia. 3. Small joint effusion. 4. No acute fracture. Electronically Signed   By: Randa Ngo M.D.   On: 04/07/2021 16:43   DG Hip Unilat W or Wo Pelvis 2-3 Views Right  Result Date: 04/07/2021 CLINICAL DATA:  Found down after fall yesterday, right-sided hip pain EXAM: DG HIP (WITH OR WITHOUT PELVIS) 2-3V RIGHT COMPARISON:  None. FINDINGS: Frontal view of the pelvis as well as frontal and frogleg lateral views of the right hip are obtained. There are comminuted displaced fractures through the right superior and inferior pubic rami, with moderate displacement. No other acute displaced fractures. Symmetrical bilateral hip osteoarthritis. Sacroiliac joints  are unremarkable. Mild spondylosis and facet hypertrophy at the lumbosacral junction. IMPRESSION: 1. Comminuted moderately displaced right superior and inferior pubic rami fractures. 2. Bilateral hip osteoarthritis. 3. Lower lumbar spondylosis and facet hypertrophy. Electronically Signed   By: Randa Ngo M.D.   On: 04/07/2021 16:42      Subjective: No pain today . No complaints  Discharge Exam: Vitals:   04/10/21 0438 04/10/21 0859  BP: (!) 146/82 (!) 144/79  Pulse: 71 68  Resp:  17  Temp:  98.3 F (36.8 C)  SpO2: 94% 97%   Vitals:   04/09/21 2226 04/10/21 0438 04/10/21 0500 04/10/21 0859  BP: (!) 144/70 (!) 146/82  (!) 144/79  Pulse: 71 71  68  Resp: 16   17  Temp: 99.1 F (37.3 C)   98.3 F (36.8 C)  TempSrc: Oral   Oral  SpO2: 96% 94%  97%   Weight:   54.3 kg     General: Pt is alert, awake, not in acute distress Cardiovascular: RRR, S1/S2 +, no rubs, no gallops Respiratory: CTA bilaterally, no wheezing, no rhonchi Abdominal: Soft, NT, ND, bowel sounds + Extremities: no edema, no cyanosis    The results of significant diagnostics from this hospitalization (including imaging, microbiology, ancillary and laboratory) are listed below for reference.     Microbiology: Recent Results (from the past 240 hour(s))  Resp Panel by RT-PCR (Flu A&B, Covid) Nasopharyngeal Swab     Status: None   Collection Time: 04/07/21  7:34 PM   Specimen: Nasopharyngeal Swab; Nasopharyngeal(NP) swabs in vial transport medium  Result Value Ref Range Status   SARS Coronavirus 2 by RT PCR NEGATIVE NEGATIVE Final    Comment: (NOTE) SARS-CoV-2 target nucleic acids are NOT DETECTED.  The SARS-CoV-2 RNA is generally detectable in upper respiratory specimens during the acute phase of infection. The lowest concentration of SARS-CoV-2 viral copies this assay can detect is 138 copies/mL. A negative result does not preclude SARS-Cov-2 infection and should not be used as the sole basis for treatment or other patient management decisions. A negative result may occur with  improper specimen collection/handling, submission of specimen other than nasopharyngeal swab, presence of viral mutation(s) within the areas targeted by this assay, and inadequate number of viral copies(<138 copies/mL). A negative result must be combined with clinical observations, patient history, and epidemiological information. The expected result is Negative.  Fact Sheet for Patients:  EntrepreneurPulse.com.au  Fact Sheet for Healthcare Providers:  IncredibleEmployment.be  This test is no t yet approved or cleared by the [place] FDA and  has been authorized for detection and/or diagnosis of SARS-CoV-2 by FDA under an Emergency Use  Authorization (EUA). This EUA will remain  in effect (meaning this test can be used) for the duration of the COVID-19 declaration under Section 564(b)(1) of the Act, 21 U.S.C.section 360bbb-3(b)(1), unless the authorization is terminated  or revoked sooner.       Influenza A by PCR NEGATIVE NEGATIVE Final   Influenza B by PCR NEGATIVE NEGATIVE Final    Comment: (NOTE) The Xpert Xpress SARS-CoV-2/FLU/RSV plus assay is intended as an aid in the diagnosis of influenza from Nasopharyngeal swab specimens and should not be used as a sole basis for treatment. Nasal washings and aspirates are unacceptable for Xpert Xpress SARS-CoV-2/FLU/RSV testing.  Fact Sheet for Patients: EntrepreneurPulse.com.au  Fact Sheet for Healthcare Providers: IncredibleEmployment.be  This test is not yet approved or cleared by the [place] FDA and has been authorized for detection and/or diagnosis of  SARS-CoV-2 by FDA under an Emergency Use Authorization (EUA). This EUA will remain in effect (meaning this test can be used) for the duration of the COVID-19 declaration under Section 564(b)(1) of the Act, 21 U.S.C. section 360bbb-3(b)(1), unless the authorization is terminated or revoked.  Performed at [facility], [place] [address] [place], [place] [address]      Labs: BNP (last 3 results) Recent Labs    05/16/20 1819  BNP 7,564.3*   Basic Metabolic Panel: Recent Labs  Lab 04/07/21 1548 04/08/21 0414  NA 143 142  K 4.1 3.5  CL 110 112*  CO2 24 21*  GLUCOSE 116* 113*  BUN 21 20  CREATININE 1.10* 0.99  CALCIUM 9.3 9.0  MG  --  2.0   Liver Function Tests: Recent Labs  Lab 04/07/21 1548 04/08/21 0414  AST 53* 58*  ALT 24 25  ALKPHOS 67 58  BILITOT 1.3* 1.1  PROT 5.9* 5.3*  ALBUMIN 3.5 3.1*   No results for input(s): LIPASE, AMYLASE in the last 168 hours. No results for input(s): AMMONIA in the last 168 hours. CBC: Recent Labs  Lab  04/07/21 1548 04/08/21 0414 04/10/21 0544  WBC 9.7 9.5 9.9  NEUTROABS 7.9* 7.5  --   HGB 11.4* 10.9* 10.8*  HCT 34.7* 32.3* 32.5*  MCV 94.0 94.2 92.9  PLT 157 146* 163   Cardiac Enzymes: No results for input(s): CKTOTAL, CKMB, CKMBINDEX, TROPONINI in the last 168 hours. BNP: Invalid input(s): POCBNP CBG: No results for input(s): GLUCAP in the last 168 hours. D-Dimer No results for input(s): DDIMER in the last 72 hours. Hgb A1c No results for input(s): HGBA1C in the last 72 hours. Lipid Profile No results for input(s): CHOL, HDL, LDLCALC, TRIG, CHOLHDL, LDLDIRECT in the last 72 hours. Thyroid function studies No results for input(s): TSH, T4TOTAL, T3FREE, THYROIDAB in the last 72 hours.  Invalid input(s): FREET3 Anemia work up No results for input(s): VITAMINB12, FOLATE, FERRITIN, TIBC, IRON, RETICCTPCT in the last 72 hours. Urinalysis    Component Value Date/Time   COLORURINE YELLOW 12/31/2012 1321   APPEARANCEUR Clear 11/21/2013 1412   LABSPEC 1.025 12/31/2012 1321   PHURINE 5.0 12/31/2012 1321   GLUCOSEU Negative 11/21/2013 1412   HGBUR NEGATIVE 12/31/2012 1321   BILIRUBINUR Negative 11/21/2013 1412   KETONESUR 15 (A) 12/31/2012 1321   PROTEINUR Negative 11/21/2013 1412   PROTEINUR NEGATIVE 12/31/2012 1321   UROBILINOGEN 0.2 12/31/2012 1321   NITRITE Negative 11/21/2013 1412   NITRITE NEGATIVE 12/31/2012 1321   LEUKOCYTESUR Negative 11/21/2013 1412   Sepsis Labs Invalid input(s): PROCALCITONIN,  WBC,  LACTICIDVEN Microbiology Recent Results (from the past 240 hour(s))  Resp Panel by RT-PCR (Flu A&B, Covid) Nasopharyngeal Swab     Status: None   Collection Time: 04/07/21  7:34 PM   Specimen: Nasopharyngeal Swab; Nasopharyngeal(NP) swabs in vial transport medium  Result Value Ref Range Status   SARS Coronavirus 2 by RT PCR NEGATIVE NEGATIVE Final    Comment: (NOTE) SARS-CoV-2 target nucleic acids are NOT DETECTED.  The SARS-CoV-2 RNA is generally detectable  in upper respiratory specimens during the acute phase of infection. The lowest concentration of SARS-CoV-2 viral copies this assay can detect is 138 copies/mL. A negative result does not preclude SARS-Cov-2 infection and should not be used as the sole basis for treatment or other patient management decisions. A negative result may occur with  improper specimen collection/handling, submission of specimen other than nasopharyngeal swab, presence of viral mutation(s) within the areas targeted  by this assay, and inadequate number of viral copies(<138 copies/mL). A negative result must be combined with clinical observations, patient history, and epidemiological information. The expected result is Negative.  Fact Sheet for Patients:  EntrepreneurPulse.com.au  Fact Sheet for Healthcare Providers:  IncredibleEmployment.be  This test is no t yet approved or cleared by the [place] FDA and  has been authorized for detection and/or diagnosis of SARS-CoV-2 by FDA under an Emergency Use Authorization (EUA). This EUA will remain  in effect (meaning this test can be used) for the duration of the COVID-19 declaration under Section 564(b)(1) of the Act, 21 U.S.C.section 360bbb-3(b)(1), unless the authorization is terminated  or revoked sooner.       Influenza A by PCR NEGATIVE NEGATIVE Final   Influenza B by PCR NEGATIVE NEGATIVE Final    Comment: (NOTE) The Xpert Xpress SARS-CoV-2/FLU/RSV plus assay is intended as an aid in the diagnosis of influenza from Nasopharyngeal swab specimens and should not be used as a sole basis for treatment. Nasal washings and aspirates are unacceptable for Xpert Xpress SARS-CoV-2/FLU/RSV testing.  Fact Sheet for Patients: EntrepreneurPulse.com.au  Fact Sheet for Healthcare Providers: IncredibleEmployment.be  This test is not yet approved or cleared by the [place] FDA and has  been authorized for detection and/or diagnosis of SARS-CoV-2 by FDA under an Emergency Use Authorization (EUA). This EUA will remain in effect (meaning this test can be used) for the duration of the COVID-19 declaration under Section 564(b)(1) of the Act, 21 U.S.C. section 360bbb-3(b)(1), unless the authorization is terminated or revoked.  Performed at [facility], [place] [address], [place], [place] [address]      Time coordinating discharge: Over 30 minutes  SIGNED:   Nolberto Hanlon, MD  [facility] 04/10/2021, 11:51 AM Pager   If 7PM-7AM, please contact night-coverage [email or web address] Password [email or web address]

## 2021-04-10 NOTE — Progress Notes (Signed)
Second attempt to call report to [facility] at [phone] with no answer. Pt still awaiting pick up time with [facility].

## 2021-04-10 NOTE — Progress Notes (Signed)
Physical Therapy Treatment Patient Details Name: [name] MRN: [id number] DOB: 1932-11-27 Today's Date: 04/10/2021   History of Present Illness Pt is a 88 y/o female presenting on 1/31 with R posterior hip pain after ground level fall.  Found with commuted moderately displaced R superior and inferior pubic rami fractures. PMH includes: AAA, anxiety, CHF, HTN, pacemaker, PNA.    PT Comments    Pt received in supine asleep, easy to wake and agreeable to session with fair tolerance for bed mobility and transfer training. Pt requiring +2 total assist to bring LE to EOB and to elevate trunk. Once seated at EOB pt able to balance with arms propped to side and needing min guard for donning new gown and LE TE. Mod assist +2 required to transfer from elevated EOB to recliner with stepping pivot transfer and RW management with cues throughout for safe hand placement and sequencing of LE to step to chair. Gait training deferred this session due to pt c/o pain. Pt continues to benefit from skilled PT services to progress toward functional mobility goals to allow discharge to the venue listed below.    Recommendations for follow up therapy are one component of a multi-disciplinary discharge planning process, led by the attending physician.  Recommendations may be updated based on patient status, additional functional criteria and insurance authorization.  Follow Up Recommendations  Skilled nursing-short term rehab (<3 hours/day)     Assistance Recommended at Discharge Frequent or constant Supervision/Assistance  Patient can return home with the following Two people to help with walking and/or transfers;Two people to help with bathing/dressing/bathroom   Equipment Recommendations  Other (comment) (TBA)    Recommendations for Other Services       Precautions / Restrictions Precautions Precautions: Fall Restrictions Weight Bearing Restrictions: Yes RLE Weight Bearing: Weight bearing as tolerated      Mobility  Bed Mobility Overal bed mobility: Needs Assistance Bed Mobility: Supine to Sit, Sit to Supine     Supine to sit: Max assist, Mod assist, +2 for physical assistance, HOB elevated     General bed mobility comments: total assist to bring BLE to EOB, total assist to elevate trunk to sitting. once sitting mod assist to min gaurd to maintain during dressing and LE TE    Transfers Overall transfer level: Needs assistance Equipment used: Rolling walker (2 wheels) Transfers: Sit to/from Stand, Bed to chair/wheelchair/BSC Sit to Stand: Mod assist, +2 physical assistance, From elevated surface   Step pivot transfers: Mod assist, +2 physical assistance       General transfer comment: needed mod assist+2 to power up as well as RW management, cues for safe hand placement throughout and LE management/ sepping sequence to turn to sit in recliner. cues for upright posture upon standing.    Ambulation/Gait         General Gait Details: defered due to pain   Stairs             Wheelchair Mobility    Modified Rankin (Stroke Patients Only)       Balance Overall balance assessment: Needs assistance Sitting-balance support: Bilateral upper extremity supported Sitting balance-Leahy Scale: Fair Sitting balance - Comments: able to sit EOB with arms propped for support/ balance   Standing balance support: During functional activity, Bilateral upper extremity supported Standing balance-Leahy Scale: Poor Standing balance comment: relies on heavy BUE and external support            Cognition Arousal/Alertness: Awake/alert, Lethargic Behavior During Therapy: WFL for  tasks assessed/performed Overall Cognitive Status: No family/caregiver present to determine baseline cognitive functioning Area of Impairment: Attention, Orientation, Memory, Following commands, Awareness, Safety/judgement, Problem solving         Current Attention Level: Focused Memory: Decreased  recall of precautions, Decreased short-term memory Following Commands: Follows one step commands consistently, Follows one step commands with increased time Safety/Judgement: Decreased awareness of safety, Decreased awareness of deficits Awareness: Intellectual Problem Solving: Slow processing, Decreased initiation, Difficulty sequencing, Requires tactile cues, Requires verbal cues General Comments: pt lethagic at start of session, but more awake/alert as session progressed.        Exercises General Exercises - Lower Extremity Ankle Circles/Pumps: AROM, Both, 20 reps, Supine, Seated Long Arc Quad: AROM, Both, 10 reps, Seated Hip Flexion/Marching: AROM, Left, 10 reps, Seated    General Comments General comments (skin integrity, edema, etc.): VSS      Pertinent Vitals/Pain Pain Assessment Pain Assessment: Faces Faces Pain Scale: Hurts even more (no c/o pain in supine, pain on movement) Pain Location: R hip Pain Descriptors / Indicators: Discomfort, Grimacing, Guarding Pain Intervention(s): Limited activity within patient's tolerance, Monitored during session, Repositioned    Home Living Family/patient expects to be discharged to:: Private residence Living Arrangements: Alone         Additional Comments: pt unable to provide home setup, from 2015 chart review she lives alone in a 1 level home    Prior Function            PT Goals (current goals can now be found in the care plan section) Acute Rehab PT Goals Patient Stated Goal: pt unable to state PT Goal Formulation: With patient Time For Goal Achievement: 04/22/21    Frequency    Min 3X/week      PT Plan      Co-evaluation PT/OT/SLP Co-Evaluation/Treatment: Yes Reason for Co-Treatment: Complexity of the patient's impairments (multi-system involvement);For patient/therapist safety          AM-PAC PT "6 Clicks" Mobility   Outcome Measure  Help needed turning from your back to your side while in a flat  bed without using bedrails?: A Lot Help needed moving from lying on your back to sitting on the side of a flat bed without using bedrails?: Total Help needed moving to and from a bed to a chair (including a wheelchair)?: Total Help needed standing up from a chair using your arms (e.g., wheelchair or bedside chair)?: Total Help needed to walk in hospital room?: Total Help needed climbing 3-5 steps with a railing? : Total 6 Click Score: 7    End of Session Equipment Utilized During Treatment: Gait belt Activity Tolerance: Patient limited by fatigue;Patient limited by pain Patient left: with call bell/phone within reach;in chair;with chair alarm set Nurse Communication: Mobility status PT Visit Diagnosis: Unsteadiness on feet (R26.81);Muscle weakness (generalized) (M62.81);Pain Pain - Right/Left: Right Pain - part of body: Leg     Time: 1030-1105 PT Time Calculation (min) (ACUTE ONLY): 35 min  Charges:  $Therapeutic Activity: 8-22 mins                     Josemanuel Eakins R. PTA Acute Rehabilitation Services  Office: [place] 04/10/2021, 11:29 AM

## 2021-04-10 NOTE — Progress Notes (Signed)
Occupational Therapy Treatment Patient Details Name: [name] MRN: [id number] DOB: [birth year] Today's Date: 04/10/2021   History of present illness Pt is a 88 y/o female presenting on 1/31 with R posterior hip pain after ground level fall.  Found with commuted moderately displaced R superior and inferior pubic rami fractures. PMH includes: AAA, anxiety, CHF, HTN, pacemaker, PNA.   OT comments  Pt progressing slow but steady towards acute OT goals. Remains to require +2 physical assist with bed mobility and transfers. Up to recliner this session. D/c plan remains appropriate.   Recommendations for follow up therapy are one component of a multi-disciplinary discharge planning process, led by the attending physician.  Recommendations may be updated based on patient status, additional functional criteria and insurance authorization.    Follow Up Recommendations  Skilled nursing-short term rehab (<3 hours/day)    Assistance Recommended at Discharge Frequent or constant Supervision/Assistance  Patient can return home with the following  Two people to help with walking and/or transfers;A lot of help with bathing/dressing/bathroom;Assistance with cooking/housework;Direct supervision/assist for medications management;Direct supervision/assist for financial management;Assist for transportation;Help with stairs or ramp for entrance   Equipment Recommendations  BSC/3in1;Other (comment)    Recommendations for Other Services      Precautions / Restrictions Precautions Precautions: Fall Restrictions Weight Bearing Restrictions: Yes RLE Weight Bearing: Weight bearing as tolerated       Mobility Bed Mobility Overal bed mobility: Needs Assistance Bed Mobility: Supine to Sit     Supine to sit: Max assist, +2 for physical assistance, HOB elevated     General bed mobility comments: total assist to bring BLE to EOB, total assist to elevate trunk to sitting. once sitting mod assist to min  gaurd to maintain during dressing and LE TE    Transfers Overall transfer level: Needs assistance Equipment used: Rolling walker (2 wheels) Transfers: Sit to/from Stand, Bed to chair/wheelchair/BSC Sit to Stand: Mod assist, +2 physical assistance, From elevated surface     Step pivot transfers: Mod assist, +2 physical assistance     General transfer comment: needed mod assist+2 to power up as well as RW management, cues for safe hand placement throughout and LE management/ sepping sequence to turn to sit in recliner. cues for upright posture upon standing.     Balance Overall balance assessment: Needs assistance Sitting-balance support: Bilateral upper extremity supported Sitting balance-Leahy Scale: Fair Sitting balance - Comments: able to sit EOB with arms propped for support/ balance   Standing balance support: During functional activity, Bilateral upper extremity supported Standing balance-Leahy Scale: Poor Standing balance comment: relies on heavy BUE and external support                           ADL either performed or assessed with clinical judgement   ADL Overall ADL's : Needs assistance/impaired                 Upper Body Dressing : Minimal assistance;Sitting       Toilet Transfer: Maximal assistance;+2 for physical assistance;Stand-pivot;BSC/3in1;Rolling walker (2 wheels) Toilet Transfer Details (indicate cue type and reason): simulated with EOB to recliner           General ADL Comments: assist for balance, walker management. sequencing and directional cues needed. Internally distracted by pain in standing position    Extremity/Trunk Assessment Upper Extremity Assessment Upper Extremity Assessment: Generalized weakness   Lower Extremity Assessment Lower Extremity Assessment: Defer to PT evaluation  Vision       Perception     Praxis      Cognition Arousal/Alertness: Awake/alert, Lethargic Behavior During Therapy: WFL  for tasks assessed/performed Overall Cognitive Status: No family/caregiver present to determine baseline cognitive functioning Area of Impairment: Attention, Orientation, Memory, Following commands, Awareness, Safety/judgement, Problem solving                 Orientation Level: Disoriented to, Time, Situation Current Attention Level: Focused Memory: Decreased recall of precautions, Decreased short-term memory Following Commands: Follows one step commands consistently, Follows one step commands with increased time Safety/Judgement: Decreased awareness of safety, Decreased awareness of deficits Awareness: Intellectual Problem Solving: Slow processing, Decreased initiation, Difficulty sequencing, Requires tactile cues, Requires verbal cues General Comments: pt lethagic at start of session, but more awake/alert as session progressed.        Exercises      Shoulder Instructions       General Comments VSS    Pertinent Vitals/ Pain       Pain Assessment Pain Assessment: Faces Faces Pain Scale: Hurts even more Pain Location: R hip with movement Pain Descriptors / Indicators: Discomfort, Grimacing, Guarding Pain Intervention(s): Limited activity within patient's tolerance, Monitored during session, Repositioned  Home Living Family/patient expects to be discharged to:: Private residence Living Arrangements: Alone                               Additional Comments: pt unable to provide home setup, from 2015 chart review she lives alone in a 1 level home      Prior Functioning/Environment              Frequency  Min 2X/week        Progress Toward Goals  OT Goals(current goals can now be found in the care plan section)  Progress towards OT goals: Progressing toward goals  Acute Rehab OT Goals Patient Stated Goal: feel better OT Goal Formulation: With patient Time For Goal Achievement: 04/22/21 Potential to Achieve Goals: Good ADL Goals Pt Will  Perform Grooming: with min guard assist;standing Pt Will Perform Lower Body Dressing: sit to/from stand;with mod assist Pt Will Transfer to Toilet: bedside commode;with mod assist;ambulating;stand pivot transfer Additional ADL Goal #1: Pt will demonstrate emergent awareness during ADL tasks.  Plan Discharge plan remains appropriate    Co-evaluation    PT/OT/SLP Co-Evaluation/Treatment: Yes Reason for Co-Treatment: Complexity of the patient's impairments (multi-system involvement);For patient/therapist safety   OT goals addressed during session: ADL's and self-care;Proper use of Adaptive equipment and DME      AM-PAC OT "6 Clicks" Daily Activity     Outcome Measure   Help from another person eating meals?: A Little Help from another person taking care of personal grooming?: A Little Help from another person toileting, which includes using toliet, bedpan, or urinal?: Total Help from another person bathing (including washing, rinsing, drying)?: A Lot Help from another person to put on and taking off regular upper body clothing?: A Little Help from another person to put on and taking off regular lower body clothing?: A Lot 6 Click Score: 14    End of Session Equipment Utilized During Treatment: Rolling walker (2 wheels);Gait belt  OT Visit Diagnosis: Other abnormalities of gait and mobility (R26.89);Muscle weakness (generalized) (M62.81);Pain;Other symptoms and signs involving cognitive function Pain - Right/Left: Right Pain - part of body: Hip   Activity Tolerance Patient tolerated treatment well   Patient Left in  chair;with call bell/phone within reach;Other (comment) (chair alarm in place but no box in room, NT notified)   Nurse Communication Mobility status;Other (comment) (no alarm box in room)        Time: 1030-1106 OT Time Calculation (min): 36 min  Charges: OT General Charges $OT Visit: 1 Visit OT Treatments $Self Care/Home Management : 8-22 mins  Tyrone Schimke, OT Acute Rehabilitation Services Office: [phone]   Hortencia Pilar 04/10/2021, 12:36 PM

## 2021-04-10 NOTE — TOC Transition Note (Signed)
Transition of Care [facility]) - CM/SW Discharge Note   Patient Details  Name: [name] MRN: [id number] Date of Birth: 03-Nov-1932  [facility] CM/SW Contact:  Vinie Sill, LCSW Phone Number: 04/10/2021, 1:20 PM   Clinical Narrative:     Patient will Discharge to: [facility]  Discharge Date:04/10/2021 Family Notified: Clide Cliff Transport By: Corey Harold  Per MD patient is ready for discharge. RN, patient, and facility notified of discharge. Discharge Summary sent to facility. RN given number for [phone], Room 409. Ambulance transport requested for patient.   Clinical Social Worker signing off.  Thurmond Butts, MSW, LCSW Clinical Social Worker    Final next level of care: Skilled Nursing Facility Barriers to Discharge: Barriers Resolved   Patient Goals and CMS Choice        Discharge Placement              Patient chooses bed at: [facility] Patient to be transferred to facility by: [place] Name of family member notified: friend, B Tamala Julian Patient and family notified of of transfer: 04/10/21  Discharge Plan and Services                                     Social Determinants of Health (SDOH) Interventions     Readmission Risk Interventions No flowsheet data found.

## 2021-04-10 NOTE — Progress Notes (Signed)
Report called to [place] at [facility] at this time. [phone].

## 2021-04-10 NOTE — Progress Notes (Signed)
Mobility Specialist Progress Note:   04/10/21 1500  Mobility  Range of Motion/Exercises Active;Passive;All extremities  Assistive Device None  RLE Weight Bearing WBAT  Activity Response Tolerated well  $Mobility charge 1 Mobility   Pt eager for mobility session, yet declining OOB d/t being in chair this afternoon. Performed multiple ROM and strength exercises with all extremities. Pt c/o RUE being hard to utilize since trying to twist a banana off the cluster a month ago. Pt enjoyed ROM exercises, with no c/o pain. Pt left lying in bed with bed alarm on, awaiting PTAR for d/c.  Nelta Numbers Mobility Specialist  Phone [phone]

## 2021-04-10 NOTE — Progress Notes (Signed)
Third attempt to call report to [facility] at number given by caseworker. No answer. Pt still awaiting transport by PTAR.

## 2021-04-10 NOTE — Progress Notes (Signed)
PTAR here to take pt to facility at this time.

## 2021-04-10 NOTE — Progress Notes (Signed)
Attempted to call report to [facility] at [phone] with no answer at this time. Will try again soon. Pt awaiting pick up time via PTAR.

## 2021-04-13 ENCOUNTER — Telehealth: Payer: Self-pay | Admitting: *Deleted

## 2021-04-13 NOTE — Telephone Encounter (Signed)
Transition Care Management Unsuccessful Follow-up Telephone Call  Date of discharge and from where:  04/10/2021 [facility]  Attempts:  1st Attempt  Reason for unsuccessful TCM follow-up call:  Unable to reach patient [name] SNF

## 2021-04-14 ENCOUNTER — Telehealth: Payer: Self-pay | Admitting: Family Medicine

## 2021-04-14 ENCOUNTER — Other Ambulatory Visit: Payer: Self-pay | Admitting: *Deleted

## 2021-04-14 DIAGNOSIS — S32501D Unspecified fracture of right pubis, subsequent encounter for fracture with routine healing: Secondary | ICD-10-CM | POA: Diagnosis not present

## 2021-04-14 DIAGNOSIS — I10 Essential (primary) hypertension: Secondary | ICD-10-CM | POA: Diagnosis not present

## 2021-04-14 DIAGNOSIS — Z9181 History of falling: Secondary | ICD-10-CM | POA: Diagnosis not present

## 2021-04-14 DIAGNOSIS — I480 Paroxysmal atrial fibrillation: Secondary | ICD-10-CM | POA: Diagnosis not present

## 2021-04-14 NOTE — Telephone Encounter (Signed)
Asa Lente at [facility] called regarding [name], she is in rehab there after being released from the hospital from a fx. They are requesting an immunization report. I am faxing it over to [phone].

## 2021-04-14 NOTE — Patient Outreach (Signed)
Per [place] eligible member resides in  [facility] SNF.  Screened for potential [facility] Care Management services as a benefit of member's insurance plan.  [name] admitted to SNF on 04/10/21 after hospitalization.  Communication sent to facility SW to make aware writer is following for transition plans and [facility] needs.  Will collaborate with facility SW and follow up with resident as appropriate.    [name] Rolling, MSN, [name],[name] [place] Acute Care Coordinator [phone] [facility]) [phone]  (Toll free office)

## 2021-04-16 ENCOUNTER — Other Ambulatory Visit: Payer: Self-pay | Admitting: *Deleted

## 2021-04-16 NOTE — Patient Outreach (Signed)
THN Post- Acute Care Coordinator follow up. Per [place] eligible member resides in  [facility] SNF.  Screened for potential [facility] Care Management services as a benefit of member's insurance plan.  Facility site visit to [facility] skilled nursing facility. Met with Claiborne Billings, Admissions Coordinator. Ms. Fiske anticipated transition plan is to return home alone.   Went to Ms. Cashatt room to discuss [place] Management services. However, she was off the unit with therapy. Will follow up at later time.   Will continue to collaborate with [facility] staff and follow transition plans/needs while Ms. Rennaker resides in SNF.    Marthenia Rolling, MSN, RN,BSN [place] Acute Care Coordinator [phone] [facility]) [phone]  (Toll free office)

## 2021-04-30 ENCOUNTER — Other Ambulatory Visit: Payer: Self-pay | Admitting: *Deleted

## 2021-04-30 NOTE — Patient Outreach (Signed)
THN Post- Acute Care Coordinator follow up. Per [place] eligible member resides in  [facility] SNF.  Screened for potential [facility] Care Management services as a benefit of member's insurance plan.  Facility site visit to [facility] skilled nursing facility. Met with [name] at bedside to discuss transition plans and [place] Management services.   [name] confirms transition plan is to return home alone. Reports she has supportive friends, neighbors, and church members. She is agreeable to [place] Management services. Discussed [facility] Care Management will not interfere with home health. Discussed writer will continue to follow while she remains in SNF.   Confirmed best contact number for [name] is her home number [phone].  Provided [facility] Care Management brochure, 24-hr nurse advice line magnet, and writer's contact information.   Will continue to follow transition plans/needs while member resides in SNF. Will make [facility] Care Management referral upon SNF transition.   [name] has history of CHF, PAF, CAD, GERD, HTN, depression.    [name] Rolling, MSN, [name],[name] [place] Acute Care Coordinator [phone] [facility]) [phone]  (Toll free office)

## 2021-05-01 DIAGNOSIS — Z9181 History of falling: Secondary | ICD-10-CM | POA: Diagnosis not present

## 2021-05-01 DIAGNOSIS — F331 Major depressive disorder, recurrent, moderate: Secondary | ICD-10-CM | POA: Diagnosis not present

## 2021-05-01 DIAGNOSIS — E039 Hypothyroidism, unspecified: Secondary | ICD-10-CM | POA: Diagnosis not present

## 2021-05-01 DIAGNOSIS — E559 Vitamin D deficiency, unspecified: Secondary | ICD-10-CM | POA: Diagnosis not present

## 2021-05-04 DIAGNOSIS — W19XXXA Unspecified fall, initial encounter: Secondary | ICD-10-CM | POA: Diagnosis not present

## 2021-05-04 DIAGNOSIS — E860 Dehydration: Secondary | ICD-10-CM | POA: Diagnosis not present

## 2021-05-04 DIAGNOSIS — M6281 Muscle weakness (generalized): Secondary | ICD-10-CM | POA: Diagnosis not present

## 2021-05-04 DIAGNOSIS — R2689 Other abnormalities of gait and mobility: Secondary | ICD-10-CM | POA: Diagnosis not present

## 2021-05-04 DIAGNOSIS — N1831 Chronic kidney disease, stage 3a: Secondary | ICD-10-CM | POA: Diagnosis not present

## 2021-05-04 DIAGNOSIS — Z9181 History of falling: Secondary | ICD-10-CM | POA: Diagnosis not present

## 2021-05-04 DIAGNOSIS — D649 Anemia, unspecified: Secondary | ICD-10-CM | POA: Diagnosis not present

## 2021-05-07 DIAGNOSIS — M25561 Pain in right knee: Secondary | ICD-10-CM | POA: Diagnosis not present

## 2021-05-07 DIAGNOSIS — S32591A Other specified fracture of right pubis, initial encounter for closed fracture: Secondary | ICD-10-CM | POA: Diagnosis not present

## 2021-05-08 ENCOUNTER — Telehealth: Payer: Self-pay | Admitting: Cardiology

## 2021-05-08 NOTE — Telephone Encounter (Signed)
I am OK with taking lasix PRN for swelling, increase in weight or SOB. ? ?[name] [name] MD, [facility] ? ?

## 2021-05-08 NOTE — Telephone Encounter (Signed)
Spoke with patient who states that she fell last month and broke her pelvis. She is currently at [facility]. She is asking if she can stop taking furosemide because she has no edema in her legs and is tired of getting up to the bathroom while healing. I recommended that she speak with her nurse regarding medications. Please advise on furosemide. ?

## 2021-05-08 NOTE — Telephone Encounter (Signed)
Spoke with patient to inform her that she can take lasix as needed for swelling, increase in weight,or sob. Also faxed this order to [place] at [facility]. ?

## 2021-05-08 NOTE — Telephone Encounter (Signed)
Pt c/o medication issue: ? ?1. Name of Medication: furosemide (LASIX) 20 MG tablet ? ?2. How are you currently taking this medication (dosage and times per day)? As prescribed  ? ?3. Are you having a reaction (difficulty breathing--STAT)? No  ? ?4. What is your medication issue? Patient is wanting to stop this medication due to leg swelling being under control for the past month. She is having to urinate too much while on it and it is hard for her to get up and do so due to being in rehab for a fall that occurred in February.   ?

## 2021-05-11 ENCOUNTER — Encounter: Payer: Medicare Other | Admitting: Internal Medicine

## 2021-05-12 DIAGNOSIS — Z7689 Persons encountering health services in other specified circumstances: Secondary | ICD-10-CM | POA: Diagnosis not present

## 2021-05-12 DIAGNOSIS — F331 Major depressive disorder, recurrent, moderate: Secondary | ICD-10-CM | POA: Diagnosis not present

## 2021-05-13 DIAGNOSIS — W19XXXA Unspecified fall, initial encounter: Secondary | ICD-10-CM | POA: Diagnosis not present

## 2021-05-13 DIAGNOSIS — M1711 Unilateral primary osteoarthritis, right knee: Secondary | ICD-10-CM | POA: Diagnosis not present

## 2021-05-13 DIAGNOSIS — M6281 Muscle weakness (generalized): Secondary | ICD-10-CM | POA: Diagnosis not present

## 2021-05-13 DIAGNOSIS — M25561 Pain in right knee: Secondary | ICD-10-CM | POA: Diagnosis not present

## 2021-05-15 ENCOUNTER — Other Ambulatory Visit: Payer: Self-pay | Admitting: *Deleted

## 2021-05-15 NOTE — Patient Outreach (Signed)
THN Post- Acute Care Coordinator follow up.  ? ?Ms. Klaas no longer on Medicare All Payers List for being eligible for [facility] services. ? ?Will no longer follow. ? ? ?Marthenia Rolling, MSN, RN,BSN ?[place] Coordinator ?[phone] [facility]) ?[phone]  (Toll free office)  ?

## 2021-05-26 DIAGNOSIS — M6281 Muscle weakness (generalized): Secondary | ICD-10-CM | POA: Diagnosis not present

## 2021-05-26 DIAGNOSIS — Z9181 History of falling: Secondary | ICD-10-CM | POA: Diagnosis not present

## 2021-05-26 DIAGNOSIS — R2689 Other abnormalities of gait and mobility: Secondary | ICD-10-CM | POA: Diagnosis not present

## 2021-06-04 DIAGNOSIS — I480 Paroxysmal atrial fibrillation: Secondary | ICD-10-CM | POA: Diagnosis not present

## 2021-06-04 DIAGNOSIS — F419 Anxiety disorder, unspecified: Secondary | ICD-10-CM | POA: Diagnosis not present

## 2021-06-04 DIAGNOSIS — K219 Gastro-esophageal reflux disease without esophagitis: Secondary | ICD-10-CM | POA: Diagnosis not present

## 2021-06-04 DIAGNOSIS — G2581 Restless legs syndrome: Secondary | ICD-10-CM | POA: Diagnosis not present

## 2021-06-04 DIAGNOSIS — Z950 Presence of cardiac pacemaker: Secondary | ICD-10-CM | POA: Diagnosis not present

## 2021-06-04 DIAGNOSIS — Z9181 History of falling: Secondary | ICD-10-CM | POA: Diagnosis not present

## 2021-06-04 DIAGNOSIS — I5022 Chronic systolic (congestive) heart failure: Secondary | ICD-10-CM | POA: Diagnosis not present

## 2021-06-04 DIAGNOSIS — E785 Hyperlipidemia, unspecified: Secondary | ICD-10-CM | POA: Diagnosis not present

## 2021-06-04 DIAGNOSIS — M6281 Muscle weakness (generalized): Secondary | ICD-10-CM | POA: Diagnosis not present

## 2021-06-04 DIAGNOSIS — I714 Abdominal aortic aneurysm, without rupture, unspecified: Secondary | ICD-10-CM | POA: Diagnosis not present

## 2021-06-04 DIAGNOSIS — G4700 Insomnia, unspecified: Secondary | ICD-10-CM | POA: Diagnosis not present

## 2021-06-04 DIAGNOSIS — I723 Aneurysm of iliac artery: Secondary | ICD-10-CM | POA: Diagnosis not present

## 2021-06-04 DIAGNOSIS — I495 Sick sinus syndrome: Secondary | ICD-10-CM | POA: Diagnosis not present

## 2021-06-04 DIAGNOSIS — F331 Major depressive disorder, recurrent, moderate: Secondary | ICD-10-CM | POA: Diagnosis not present

## 2021-06-04 DIAGNOSIS — J329 Chronic sinusitis, unspecified: Secondary | ICD-10-CM | POA: Diagnosis not present

## 2021-06-04 DIAGNOSIS — T148XXD Other injury of unspecified body region, subsequent encounter: Secondary | ICD-10-CM | POA: Diagnosis not present

## 2021-06-04 DIAGNOSIS — E538 Deficiency of other specified B group vitamins: Secondary | ICD-10-CM | POA: Diagnosis not present

## 2021-06-04 DIAGNOSIS — I110 Hypertensive heart disease with heart failure: Secondary | ICD-10-CM | POA: Diagnosis not present

## 2021-06-04 DIAGNOSIS — S32591D Other specified fracture of right pubis, subsequent encounter for fracture with routine healing: Secondary | ICD-10-CM | POA: Diagnosis not present

## 2021-06-04 DIAGNOSIS — E559 Vitamin D deficiency, unspecified: Secondary | ICD-10-CM | POA: Diagnosis not present

## 2021-06-04 DIAGNOSIS — E039 Hypothyroidism, unspecified: Secondary | ICD-10-CM | POA: Diagnosis not present

## 2021-06-04 DIAGNOSIS — I255 Ischemic cardiomyopathy: Secondary | ICD-10-CM | POA: Diagnosis not present

## 2021-06-04 DIAGNOSIS — I2584 Coronary atherosclerosis due to calcified coronary lesion: Secondary | ICD-10-CM | POA: Diagnosis not present

## 2021-06-04 DIAGNOSIS — I4819 Other persistent atrial fibrillation: Secondary | ICD-10-CM | POA: Diagnosis not present

## 2021-06-04 DIAGNOSIS — M1711 Unilateral primary osteoarthritis, right knee: Secondary | ICD-10-CM | POA: Diagnosis not present

## 2021-06-08 ENCOUNTER — Telehealth: Payer: Self-pay | Admitting: Cardiology

## 2021-06-08 NOTE — Telephone Encounter (Signed)
Pt called and stated she just got our of rehab on 3/23 and would like to talk with Malachy Mood about her meds.  This all the info she would provide  ? ?Best number [phone] ?

## 2021-06-09 DIAGNOSIS — S32591D Other specified fracture of right pubis, subsequent encounter for fracture with routine healing: Secondary | ICD-10-CM | POA: Diagnosis not present

## 2021-06-09 DIAGNOSIS — I5022 Chronic systolic (congestive) heart failure: Secondary | ICD-10-CM | POA: Diagnosis not present

## 2021-06-09 DIAGNOSIS — I495 Sick sinus syndrome: Secondary | ICD-10-CM | POA: Diagnosis not present

## 2021-06-09 DIAGNOSIS — I110 Hypertensive heart disease with heart failure: Secondary | ICD-10-CM | POA: Diagnosis not present

## 2021-06-09 DIAGNOSIS — I480 Paroxysmal atrial fibrillation: Secondary | ICD-10-CM | POA: Diagnosis not present

## 2021-06-09 DIAGNOSIS — M1711 Unilateral primary osteoarthritis, right knee: Secondary | ICD-10-CM | POA: Diagnosis not present

## 2021-06-09 NOTE — Telephone Encounter (Signed)
Spoke to patient 4/3.She stated she just got out of rehab.Stated she is still having trouble walking.Stated her legs are weak.She wants to know if she can stop taking Amiodarone.She read leg weakness was a side effect.Advised she needs to keep taking Amiodarone.Appointment scheduled with Dr.Jordan 06/23/21 at 4:00 pm.Advised I will make Dr.Jordan aware. ?

## 2021-06-10 DIAGNOSIS — I110 Hypertensive heart disease with heart failure: Secondary | ICD-10-CM | POA: Diagnosis not present

## 2021-06-10 DIAGNOSIS — I495 Sick sinus syndrome: Secondary | ICD-10-CM | POA: Diagnosis not present

## 2021-06-10 DIAGNOSIS — S32591D Other specified fracture of right pubis, subsequent encounter for fracture with routine healing: Secondary | ICD-10-CM | POA: Diagnosis not present

## 2021-06-10 DIAGNOSIS — I480 Paroxysmal atrial fibrillation: Secondary | ICD-10-CM | POA: Diagnosis not present

## 2021-06-10 DIAGNOSIS — M1711 Unilateral primary osteoarthritis, right knee: Secondary | ICD-10-CM | POA: Diagnosis not present

## 2021-06-10 DIAGNOSIS — I5022 Chronic systolic (congestive) heart failure: Secondary | ICD-10-CM | POA: Diagnosis not present

## 2021-06-11 ENCOUNTER — Encounter: Payer: Medicare Other | Admitting: Physician Assistant

## 2021-06-11 DIAGNOSIS — M1711 Unilateral primary osteoarthritis, right knee: Secondary | ICD-10-CM | POA: Diagnosis not present

## 2021-06-11 DIAGNOSIS — I480 Paroxysmal atrial fibrillation: Secondary | ICD-10-CM | POA: Diagnosis not present

## 2021-06-11 DIAGNOSIS — S32591D Other specified fracture of right pubis, subsequent encounter for fracture with routine healing: Secondary | ICD-10-CM | POA: Diagnosis not present

## 2021-06-11 DIAGNOSIS — I5022 Chronic systolic (congestive) heart failure: Secondary | ICD-10-CM | POA: Diagnosis not present

## 2021-06-11 DIAGNOSIS — I110 Hypertensive heart disease with heart failure: Secondary | ICD-10-CM | POA: Diagnosis not present

## 2021-06-11 DIAGNOSIS — I495 Sick sinus syndrome: Secondary | ICD-10-CM | POA: Diagnosis not present

## 2021-06-12 ENCOUNTER — Telehealth: Payer: Self-pay | Admitting: Cardiology

## 2021-06-12 NOTE — Telephone Encounter (Signed)
Patient returning  a phone call please advise ?

## 2021-06-12 NOTE — Telephone Encounter (Signed)
Returned call to patient who states she was calling to speak with cheryl. Advised patient that she was out of office this afternoon and offered to help with anything the patient needed. Patient states she can wait and speak with Malachy Mood on Monday. Advised patient I would forward message to [facility], patient verbalized understanding.  ?

## 2021-06-15 NOTE — Telephone Encounter (Signed)
Spoke to patient she stated since Dr.Jordan wants her to keep taking Amiodarone she wants to cancel 4/18 appointment.She will keep appointment with Dr.Jordan 5/22 at 4:20 pm. ?

## 2021-06-16 DIAGNOSIS — I110 Hypertensive heart disease with heart failure: Secondary | ICD-10-CM | POA: Diagnosis not present

## 2021-06-16 DIAGNOSIS — I5022 Chronic systolic (congestive) heart failure: Secondary | ICD-10-CM | POA: Diagnosis not present

## 2021-06-16 DIAGNOSIS — I495 Sick sinus syndrome: Secondary | ICD-10-CM | POA: Diagnosis not present

## 2021-06-16 DIAGNOSIS — M1711 Unilateral primary osteoarthritis, right knee: Secondary | ICD-10-CM | POA: Diagnosis not present

## 2021-06-16 DIAGNOSIS — I480 Paroxysmal atrial fibrillation: Secondary | ICD-10-CM | POA: Diagnosis not present

## 2021-06-16 DIAGNOSIS — S32591D Other specified fracture of right pubis, subsequent encounter for fracture with routine healing: Secondary | ICD-10-CM | POA: Diagnosis not present

## 2021-06-18 DIAGNOSIS — I5022 Chronic systolic (congestive) heart failure: Secondary | ICD-10-CM | POA: Diagnosis not present

## 2021-06-18 DIAGNOSIS — M1711 Unilateral primary osteoarthritis, right knee: Secondary | ICD-10-CM | POA: Diagnosis not present

## 2021-06-18 DIAGNOSIS — I110 Hypertensive heart disease with heart failure: Secondary | ICD-10-CM | POA: Diagnosis not present

## 2021-06-18 DIAGNOSIS — I480 Paroxysmal atrial fibrillation: Secondary | ICD-10-CM | POA: Diagnosis not present

## 2021-06-18 DIAGNOSIS — I495 Sick sinus syndrome: Secondary | ICD-10-CM | POA: Diagnosis not present

## 2021-06-18 DIAGNOSIS — S32591D Other specified fracture of right pubis, subsequent encounter for fracture with routine healing: Secondary | ICD-10-CM | POA: Diagnosis not present

## 2021-06-23 ENCOUNTER — Ambulatory Visit: Payer: Medicare Other | Admitting: Cardiology

## 2021-06-23 DIAGNOSIS — I5022 Chronic systolic (congestive) heart failure: Secondary | ICD-10-CM | POA: Diagnosis not present

## 2021-06-23 DIAGNOSIS — S32591D Other specified fracture of right pubis, subsequent encounter for fracture with routine healing: Secondary | ICD-10-CM | POA: Diagnosis not present

## 2021-06-23 DIAGNOSIS — I480 Paroxysmal atrial fibrillation: Secondary | ICD-10-CM | POA: Diagnosis not present

## 2021-06-23 DIAGNOSIS — I495 Sick sinus syndrome: Secondary | ICD-10-CM | POA: Diagnosis not present

## 2021-06-23 DIAGNOSIS — M1711 Unilateral primary osteoarthritis, right knee: Secondary | ICD-10-CM | POA: Diagnosis not present

## 2021-06-23 DIAGNOSIS — I110 Hypertensive heart disease with heart failure: Secondary | ICD-10-CM | POA: Diagnosis not present

## 2021-06-25 ENCOUNTER — Encounter: Payer: Medicare Other | Admitting: Physician Assistant

## 2021-06-25 DIAGNOSIS — I110 Hypertensive heart disease with heart failure: Secondary | ICD-10-CM | POA: Diagnosis not present

## 2021-06-25 DIAGNOSIS — I480 Paroxysmal atrial fibrillation: Secondary | ICD-10-CM | POA: Diagnosis not present

## 2021-06-25 DIAGNOSIS — I5022 Chronic systolic (congestive) heart failure: Secondary | ICD-10-CM | POA: Diagnosis not present

## 2021-06-25 DIAGNOSIS — S32591D Other specified fracture of right pubis, subsequent encounter for fracture with routine healing: Secondary | ICD-10-CM | POA: Diagnosis not present

## 2021-06-25 DIAGNOSIS — I495 Sick sinus syndrome: Secondary | ICD-10-CM | POA: Diagnosis not present

## 2021-06-25 DIAGNOSIS — M1711 Unilateral primary osteoarthritis, right knee: Secondary | ICD-10-CM | POA: Diagnosis not present

## 2021-06-29 DIAGNOSIS — I5022 Chronic systolic (congestive) heart failure: Secondary | ICD-10-CM | POA: Diagnosis not present

## 2021-06-29 DIAGNOSIS — S32591D Other specified fracture of right pubis, subsequent encounter for fracture with routine healing: Secondary | ICD-10-CM | POA: Diagnosis not present

## 2021-06-29 DIAGNOSIS — I480 Paroxysmal atrial fibrillation: Secondary | ICD-10-CM | POA: Diagnosis not present

## 2021-06-29 DIAGNOSIS — I495 Sick sinus syndrome: Secondary | ICD-10-CM | POA: Diagnosis not present

## 2021-06-29 DIAGNOSIS — M1711 Unilateral primary osteoarthritis, right knee: Secondary | ICD-10-CM | POA: Diagnosis not present

## 2021-06-29 DIAGNOSIS — I110 Hypertensive heart disease with heart failure: Secondary | ICD-10-CM | POA: Diagnosis not present

## 2021-07-02 DIAGNOSIS — M1711 Unilateral primary osteoarthritis, right knee: Secondary | ICD-10-CM | POA: Diagnosis not present

## 2021-07-02 DIAGNOSIS — S32591D Other specified fracture of right pubis, subsequent encounter for fracture with routine healing: Secondary | ICD-10-CM | POA: Diagnosis not present

## 2021-07-02 DIAGNOSIS — I480 Paroxysmal atrial fibrillation: Secondary | ICD-10-CM | POA: Diagnosis not present

## 2021-07-02 DIAGNOSIS — I110 Hypertensive heart disease with heart failure: Secondary | ICD-10-CM | POA: Diagnosis not present

## 2021-07-02 DIAGNOSIS — I495 Sick sinus syndrome: Secondary | ICD-10-CM | POA: Diagnosis not present

## 2021-07-02 DIAGNOSIS — I5022 Chronic systolic (congestive) heart failure: Secondary | ICD-10-CM | POA: Diagnosis not present

## 2021-07-04 DIAGNOSIS — T148XXD Other injury of unspecified body region, subsequent encounter: Secondary | ICD-10-CM | POA: Diagnosis not present

## 2021-07-04 DIAGNOSIS — G4700 Insomnia, unspecified: Secondary | ICD-10-CM | POA: Diagnosis not present

## 2021-07-04 DIAGNOSIS — F419 Anxiety disorder, unspecified: Secondary | ICD-10-CM | POA: Diagnosis not present

## 2021-07-04 DIAGNOSIS — I714 Abdominal aortic aneurysm, without rupture, unspecified: Secondary | ICD-10-CM | POA: Diagnosis not present

## 2021-07-04 DIAGNOSIS — F331 Major depressive disorder, recurrent, moderate: Secondary | ICD-10-CM | POA: Diagnosis not present

## 2021-07-04 DIAGNOSIS — M1711 Unilateral primary osteoarthritis, right knee: Secondary | ICD-10-CM | POA: Diagnosis not present

## 2021-07-04 DIAGNOSIS — Z950 Presence of cardiac pacemaker: Secondary | ICD-10-CM | POA: Diagnosis not present

## 2021-07-04 DIAGNOSIS — K219 Gastro-esophageal reflux disease without esophagitis: Secondary | ICD-10-CM | POA: Diagnosis not present

## 2021-07-04 DIAGNOSIS — I480 Paroxysmal atrial fibrillation: Secondary | ICD-10-CM | POA: Diagnosis not present

## 2021-07-04 DIAGNOSIS — I255 Ischemic cardiomyopathy: Secondary | ICD-10-CM | POA: Diagnosis not present

## 2021-07-04 DIAGNOSIS — I5022 Chronic systolic (congestive) heart failure: Secondary | ICD-10-CM | POA: Diagnosis not present

## 2021-07-04 DIAGNOSIS — S32591D Other specified fracture of right pubis, subsequent encounter for fracture with routine healing: Secondary | ICD-10-CM | POA: Diagnosis not present

## 2021-07-04 DIAGNOSIS — E559 Vitamin D deficiency, unspecified: Secondary | ICD-10-CM | POA: Diagnosis not present

## 2021-07-04 DIAGNOSIS — I723 Aneurysm of iliac artery: Secondary | ICD-10-CM | POA: Diagnosis not present

## 2021-07-04 DIAGNOSIS — I2584 Coronary atherosclerosis due to calcified coronary lesion: Secondary | ICD-10-CM | POA: Diagnosis not present

## 2021-07-04 DIAGNOSIS — E785 Hyperlipidemia, unspecified: Secondary | ICD-10-CM | POA: Diagnosis not present

## 2021-07-04 DIAGNOSIS — J329 Chronic sinusitis, unspecified: Secondary | ICD-10-CM | POA: Diagnosis not present

## 2021-07-04 DIAGNOSIS — E039 Hypothyroidism, unspecified: Secondary | ICD-10-CM | POA: Diagnosis not present

## 2021-07-04 DIAGNOSIS — M6281 Muscle weakness (generalized): Secondary | ICD-10-CM | POA: Diagnosis not present

## 2021-07-04 DIAGNOSIS — E538 Deficiency of other specified B group vitamins: Secondary | ICD-10-CM | POA: Diagnosis not present

## 2021-07-04 DIAGNOSIS — I4819 Other persistent atrial fibrillation: Secondary | ICD-10-CM | POA: Diagnosis not present

## 2021-07-04 DIAGNOSIS — I110 Hypertensive heart disease with heart failure: Secondary | ICD-10-CM | POA: Diagnosis not present

## 2021-07-04 DIAGNOSIS — I495 Sick sinus syndrome: Secondary | ICD-10-CM | POA: Diagnosis not present

## 2021-07-04 DIAGNOSIS — Z9181 History of falling: Secondary | ICD-10-CM | POA: Diagnosis not present

## 2021-07-04 DIAGNOSIS — G2581 Restless legs syndrome: Secondary | ICD-10-CM | POA: Diagnosis not present

## 2021-07-07 DIAGNOSIS — I480 Paroxysmal atrial fibrillation: Secondary | ICD-10-CM | POA: Diagnosis not present

## 2021-07-07 DIAGNOSIS — I110 Hypertensive heart disease with heart failure: Secondary | ICD-10-CM | POA: Diagnosis not present

## 2021-07-07 DIAGNOSIS — I5022 Chronic systolic (congestive) heart failure: Secondary | ICD-10-CM | POA: Diagnosis not present

## 2021-07-07 DIAGNOSIS — S32591D Other specified fracture of right pubis, subsequent encounter for fracture with routine healing: Secondary | ICD-10-CM | POA: Diagnosis not present

## 2021-07-07 DIAGNOSIS — I495 Sick sinus syndrome: Secondary | ICD-10-CM | POA: Diagnosis not present

## 2021-07-07 DIAGNOSIS — M1711 Unilateral primary osteoarthritis, right knee: Secondary | ICD-10-CM | POA: Diagnosis not present

## 2021-07-08 DIAGNOSIS — S32591D Other specified fracture of right pubis, subsequent encounter for fracture with routine healing: Secondary | ICD-10-CM | POA: Diagnosis not present

## 2021-07-08 DIAGNOSIS — I110 Hypertensive heart disease with heart failure: Secondary | ICD-10-CM | POA: Diagnosis not present

## 2021-07-08 DIAGNOSIS — I495 Sick sinus syndrome: Secondary | ICD-10-CM | POA: Diagnosis not present

## 2021-07-08 DIAGNOSIS — I480 Paroxysmal atrial fibrillation: Secondary | ICD-10-CM | POA: Diagnosis not present

## 2021-07-08 DIAGNOSIS — M1711 Unilateral primary osteoarthritis, right knee: Secondary | ICD-10-CM | POA: Diagnosis not present

## 2021-07-08 DIAGNOSIS — I5022 Chronic systolic (congestive) heart failure: Secondary | ICD-10-CM | POA: Diagnosis not present

## 2021-07-14 ENCOUNTER — Ambulatory Visit: Payer: Medicare Other | Admitting: Family Medicine

## 2021-07-16 DIAGNOSIS — M1711 Unilateral primary osteoarthritis, right knee: Secondary | ICD-10-CM | POA: Diagnosis not present

## 2021-07-16 DIAGNOSIS — I5022 Chronic systolic (congestive) heart failure: Secondary | ICD-10-CM | POA: Diagnosis not present

## 2021-07-16 DIAGNOSIS — I495 Sick sinus syndrome: Secondary | ICD-10-CM | POA: Diagnosis not present

## 2021-07-16 DIAGNOSIS — S32591D Other specified fracture of right pubis, subsequent encounter for fracture with routine healing: Secondary | ICD-10-CM | POA: Diagnosis not present

## 2021-07-16 DIAGNOSIS — I110 Hypertensive heart disease with heart failure: Secondary | ICD-10-CM | POA: Diagnosis not present

## 2021-07-16 DIAGNOSIS — I480 Paroxysmal atrial fibrillation: Secondary | ICD-10-CM | POA: Diagnosis not present

## 2021-07-21 ENCOUNTER — Encounter: Payer: Medicare Other | Admitting: Physician Assistant

## 2021-07-22 NOTE — Progress Notes (Deleted)
[name] Date of Birth: 12/23/1932 Medical Record #[id number]  History of Present Illness: [name] is seen today for follow up. She has multiple medical issues which include a cardiomyopathy, underlying BiV/ICD which resulted in improvement of her EF, LBBB, HTN, thyroid disease, melanoma, anxiety/depression and chronic systolic HF.   In September 2018 she was noted in device clinic to have paroxysmal Afib with controlled rate. She was started on Eliquis. [place] Vasc score of at least 4. She developed itching on Eliquis and switched to Xarelto. Itching did not go away so she was switched to Coumadin. After about a month the itching resolved.  She was later switched back to Xarelto without rash. She had change out of her ICD generator in November due to ERI. Her device was downgraded to Biventricular pacing only. Follow up with Dr Lovena Le  In February showed normal device function.  She was seen in the office on 04/28/20 with complaints of increased SOB, edema and fatigue. Was found to be in Afib with rate 125. Patient admitted from the office with acute on chronic CHF felt to be secondary to atrial fibrillation with RVR. BNP elevated at 1,299. Chest x-ray consistent with CHF with mild pulmonary edema and small bilateral pleural effusions. Echo showed LVEF of 25-30% (down from 55-60% in 06/2019) with global hypokinesis and moderate to severe MR. RV mildly enlarged with mildly reduced systolic function, severe TR, and moderately elevated PASP. Also showed small pericardial effusion. Patient was diuresed with IV Lasix. Net negative 8.078 L during admission. Discharge weight 117lb but this may not be accurate (was 130.5lbs on 05/19/2020). Was discharged on Lasix '40mg'$  daily, Losartan '25mg'$  daily (rather than home Trandolapril), and Toprol-XL '25mg'$  daily (rather than home Lopressor). Did not add spironolactone  given soft BP at times. Patient also does not want to be on a lot of new medications. She was  considered for Tikosyn but did not want to start this. She was started on amiodarone.  Drop in EF felt to be secondary to atrial fibrillation with RVR. Given advance age, it was recommended to treat atrial fibrillation and heart failure medically and then reassess EF in 3 months. No ischemic evaluation felt to be necessary this admission.     On her last visit we added aldactone 25 mg daily. This resulted in  increase in creatinine and potassium aldactone was discontinued.   Repeat Echo in August showed improvement in EF to 45-50%. Last device check in August showed no recurrent Afib. She never did start Jardiance and in fact she does have a history of frequent UTIs.   She was admitted in February after mechanical fall. Cranial CT was negative. X ray revealed Acute fractures of the right superior and left inferior pubic rami with moderate displacement. Seen by ortho and treated conservatively.  On follow up she complains that she fatigues easily and is off balance. Thinks her legs are weak. Reports pain in the back of her thigh.   Current Outpatient Medications  Medication Sig Dispense Refill   acetaminophen (TYLENOL) 500 MG tablet Take 1,000 mg by mouth every 8 (eight) hours as needed for moderate pain or mild pain.     amiodarone (PACERONE) 200 MG tablet Take 0.5 tablets (100 mg total) by mouth daily. 90 tablet 3   Ascorbic Acid (VITAMIN C) 1000 MG tablet Take 1,000 mg by mouth at bedtime. Ester C     Biotin 5000 MCG TABS Take 5,000 mcg by mouth daily.  Calcium Carbonate-Vitamin D (CALCIUM + D PO) Take 2 tablets by mouth daily. 1000 units Vitamin D 1200 mg calcium     Cyanocobalamin (VITAMIN B 12) 500 MCG TABS Take 1,000 mcg by mouth daily.      estradiol (ESTRACE) 0.1 MG/GM vaginal cream Place 1 g vaginally 2 (two) times a week.     fexofenadine (ALLEGRA) 180 MG tablet Take 180 mg by mouth daily.     furosemide (LASIX) 20 MG tablet Take 1 tablet (20 mg total) by mouth daily. 30 tablet     lidocaine (LIDODERM) 5 % Place 1 patch onto the skin daily. Remove & Discard patch within 12 hours or as directed by MD 30 patch 0   losartan (COZAAR) 25 MG tablet TAKE 1 TABLET BY MOUTH EVERY DAY (Patient taking differently: Take 25 mg by mouth daily.) 30 tablet 11   metoprolol succinate (TOPROL-XL) 25 MG 24 hr tablet TAKE 1 TABLET BY MOUTH EVERY DAY (Patient taking differently: Take 25 mg by mouth daily.) 30 tablet 11   Polyethyl Glycol-Propyl Glycol (SYSTANE) 0.4-0.3 % SOLN Place 1 drop into both eyes daily as needed (dry eyes).     RABEprazole (ACIPHEX) 20 MG tablet Take one tablet by mouth once daily for stomach 90 tablet 1   SYNTHROID 100 MCG tablet TAKE 1 TABLET BY MOUTH DAILY BEFORE BREAKFAST. (Patient taking differently: Take 100 mcg by mouth daily before breakfast.) 90 tablet 1   Vitamin D, Ergocalciferol, (DRISDOL) 1.25 MG (50000 UNIT) CAPS capsule Take 1 capsule (50,000 Units total) by mouth every 7 (seven) days. 5 capsule    XARELTO 15 MG TABS tablet TAKE 1 TABLET DAILY WITH   SUPPER (DOSE REDUCED) (Patient taking differently: Take 15 mg by mouth daily.) 90 tablet 3   No current facility-administered medications for this visit.    Allergies  Allergen Reactions   Hydrocodone Itching   Cephalexin Itching and Swelling   Ciprofloxacin Other (See Comments)    Not indicated due to aneurysm in aorta     Doxycycline Other (See Comments)    Unknown reaction   Escitalopram Other (See Comments)    Dry mouth   Levofloxacin Other (See Comments)    Not indicated due to aneurysm in aorta    Moxifloxacin Other (See Comments)    Not indicated due to aneurysm in aorta    Ofloxacin Other (See Comments)    Unknown reaction   Sertraline Other (See Comments)    insomnia   Sulfamethoxazole Hives    Any sulfa meds.   Tape Other (See Comments)    Burns skin.    Latex Rash    "If pt. Makes contact with or wearing"   Neomycin Rash    Past Medical History:  Diagnosis Date   AAA (abdominal  aortic aneurysm)    2.8 cm 05/2018; 5 year [place] per ACR guidelines   Allergy    all year    Anxiety disorder    Arthritis    Barrett's esophagus 04/2008   Benign neoplasm of colon    Blood transfusion without reported diagnosis    long ago per pt   Cataract    removed both eyes   CHF (congestive heart failure) ([place])    Chronic airway obstruction, not elsewhere classified    Complication of anesthesia    slow to wake up   Depressive disorder, not elsewhere classified    Diverticulitis    Dysthymic disorder    Erosive gastritis 08/2004   GERD (gastroesophageal reflux  disease)    Hair thinning    HTN (hypertension)    pt denies    Hyperplastic polyps of stomach 06/2002   Hypothyroidism    ICD (implantable cardiac defibrillator) in place    BiV/ICD; s/p removal of ICD with insertion of BiV PPM 04/26/12   Incisional hernia    Insomnia 08/20/2015   Kyphosis 08/20/2015   LBBB (left bundle branch block)    Melanoma (HCC)    right arm   Mitral regurgitation    Nonischemic cardiomyopathy ([place])    EF initially 20%; Last measurement up to 50% per echo in August of 2012   Osteopenia    Other and unspecified hyperlipidemia    Other and unspecified hyperlipidemia    Pacemaker 04/26/2012   PAF (paroxysmal atrial fibrillation) ([place])    11/2016   Pain in joint, lower leg    Palpitations    Pneumonia    Restless legs syndrome (RLS)    Synovial cyst of popliteal space    Unspecified chronic bronchitis (HCC)    Unspecified nasal polyp    Unspecified sinusitis (chronic)     Past Surgical History:  Procedure Laterality Date   ABDOMINAL HYSTERECTOMY  1076   endometriosis   APPENDECTOMY     BI-VENTRICULAR PACEMAKER INSERTION N/A 04/26/2012   Procedure: BI-VENTRICULAR PACEMAKER INSERTION (CRT-P);  Surgeon: Evans Lance, MD;  Location: [facility] CATH LAB;  Service: Cardiovascular;  Laterality: N/A;   BIV PACEMAKER GENERATOR CHANGEOUT N/A 01/26/2019   Procedure: BIV PACEMAKER GENERATOR CHANGEOUT;   Surgeon: Evans Lance, MD;  Location: [facility] CV LAB;  Service: Cardiovascular;  Laterality: N/A;   CARDIOVERSION N/A 05/06/2020   Procedure: CARDIOVERSION;  Surgeon: Pixie Casino, MD;  Location: [facility];  Service: Cardiovascular;  Laterality: N/A;   CARDIOVERSION N/A 05/19/2020   Procedure: CARDIOVERSION;  Surgeon: Jerline Pain, MD;  Location: [facility] ENDOSCOPY;  Service: Cardiovascular;  Laterality: N/A;   COLONOSCOPY     defibrillator insertion     s/p removal of previously implanted BiV ICD and insertion of a new BiV pacemaker on 04/26/12   excise mole of lip  2001   Dr. Larena Sox   excision of wen  1984   Dr. Parks Ranger   FEMORAL HERNIA REPAIR  12/25/2012   Dr Dalbert Batman   FEMORAL HERNIA REPAIR  01/04/2013   Recurrent - Dr. Nash Mantis HERNIA REPAIR N/A 10/01/2013   Procedure: LAPAROSCOPIC INCISIONAL HERNIA;  Surgeon: Gayland Curry, MD;  Location: [facility];  Service: General;  Laterality: N/A;   INCISIONAL HERNIA REPAIR N/A 09/07/2018   Procedure: LAPAROSCOPIC ASSISTED REPAIR OF INCISIONAL HERNIA;  Surgeon: Greer Pickerel, MD;  Location: [facility];  Service: General;  Laterality: N/A;   INGUINAL HERNIA REPAIR Right 09/26/2012   Procedure: RIGHT INGUINAL HERNIA REPAIR WITH MESH;  Surgeon: Odis Hollingshead, MD;  Location: [facility];  Service: General;  Laterality: Right;   INGUINAL HERNIA REPAIR Right 12/25/2012   Procedure: explor right groin, small bowel rescection, tissue repair right femoral hernia;  Surgeon: Adin Hector, MD;  Location: [facility];  Service: General;  Laterality: Right;   INSERTION OF MESH Right 09/26/2012   Procedure: INSERTION OF MESH;  Surgeon: Odis Hollingshead, MD;  Location: [facility];  Service: General;  Laterality: Right;   INSERTION OF MESH N/A 10/01/2013   Procedure: INSERTION OF MESH;  Surgeon: Gayland Curry, MD;  Location: [facility];  Service: General;  Laterality: N/A;   LAPAROSCOPY N/A 01/04/2013   Procedure:  Diagnostic Laparoscopy, exploratory laparotomy with  small bowel resection, closure of right femoral hernia repair;  Surgeon: Madilyn Hook, DO;  Location: [facility];  Service: General;  Laterality: N/A;   Left arm surgery     POLYPECTOMY     RIGHT BREAST LUMPECTOMY  1988   Dr. Marylene Buerger   right knee surgery     arthroscopy: Dr. Shellia Carwin   TONSILLECTOMY     UPPER GASTROINTESTINAL ENDOSCOPY      Social History   Tobacco Use  Smoking Status Former   Packs/day: 1.00   Years: 15.00   Pack years: 15.00   Types: Cigarettes   Quit date: 12/22/1993   Years since quitting: 27.6  Smokeless Tobacco Never  Tobacco Comments   Does'nt reall year quit     Social History   Substance and Sexual Activity  Alcohol Use No   Alcohol/week: 0.0 standard drinks    Family History  Problem Relation Age of Onset   Stroke Father    Heart disease Other        maternal side   Colon cancer Paternal Uncle    Breast cancer Maternal Aunt    Colon cancer Cousin    Diabetes Neg Hx    Colon polyps Neg Hx    Rectal cancer Neg Hx    Stomach cancer Neg Hx     Review of Systems: The review of systems is per the HPI.   All other systems were reviewed and are negative.  Physical Exam: There were no vitals taken for this visit. GENERAL:  thin, elderly thin WF in NAD HEENT:  PERRL, EOMI, sclera are clear. Oropharynx is clear. NECK:  No JVD, carotid upstroke brisk and symmetric, no bruits, no thyromegaly or adenopathy LUNGS:  Clear to auscultation bilaterally CHEST:  Unremarkable HEART:  RRR,  PMI not displaced or sustained,S1 and S2 within normal limits, no S3, no S4: no clicks, no rubs, no murmurs ABD:  Soft, nontender. BS +, no masses or bruits. No hepatomegaly, no splenomegaly EXT:  2 + pulses throughout, 1+ edema, varicosities.  SKIN:  Warm and dry.  No rashes NEURO:  Alert and oriented x 3. Cranial nerves II through XII intact. PSYCH:  Cognitively intact   Wt Readings from Last 3 Encounters:  04/10/21 119 lb 11.4 oz (54.3 kg)  03/23/21 128 lb  9.6 oz (58.3 kg)  01/13/21 129 lb (58.5 kg)     LABORATORY DATA: Lab Results  Component Value Date   WBC 9.9 04/10/2021   HGB 10.8 (L) 04/10/2021   HCT 32.5 (L) 04/10/2021   PLT 163 04/10/2021   GLUCOSE 113 (H) 04/08/2021   CHOL 159 11/22/2019   TRIG 137 11/22/2019   HDL 53 11/22/2019   LDLCALC 82 11/22/2019   ALT 25 04/08/2021   AST 58 (H) 04/08/2021   NA 142 04/08/2021   K 3.5 04/08/2021   CL 112 (H) 04/08/2021   CREATININE 0.99 04/08/2021   BUN 20 04/08/2021   CO2 21 (L) 04/08/2021   TSH 4.330 01/01/2021   INR 1.7 (H) 05/18/2020     Echo: 07/22/16: Study Conclusions   - Left ventricle: The cavity size was mildly dilated. Wall   thickness was normal. Systolic function was normal. The estimated   ejection fraction was in the range of 50% to 55%. Wall motion was   normal; there were no regional wall motion abnormalities. - Aortic valve: There was trivial regurgitation. - Mitral valve: There was mild regurgitation. - Atrial septum: No  defect or patent foramen ovale was identified.   Echo 06/29/19: IMPRESSIONS     1. Left ventricular ejection fraction, by estimation, is 55 to 60%. The  left ventricle has normal function. The left ventricle has no regional  wall motion abnormalities. Left ventricular diastolic parameters are  consistent with Grade II diastolic  dysfunction (pseudonormalization). Elevated left atrial pressure.   2. Right ventricular systolic function is normal. The right ventricular  size is normal. There is moderately elevated pulmonary artery systolic  pressure.   3. Left atrial size was moderately dilated.   4. The mitral valve is normal in structure. Mild to moderate mitral valve  regurgitation.   5. Tricuspid valve regurgitation is mild to moderate.   6. The aortic valve is tricuspid. Aortic valve regurgitation is mild. No  aortic stenosis is present.   7. Aortic dilatation noted. There is borderline dilatation of the  ascending aorta  measuring 37 mm.   8. The inferior vena cava is dilated in size with >50% respiratory  variability, suggesting right atrial pressure of 8 mmHg.    Echocardiogram 05/17/2020: Impressions:  1. Left ventricular ejection fraction, by estimation, is 25 to 30%. The  left ventricle has severely decreased function. The left ventricle  demonstrates global hypokinesis. Left ventricular diastolic function could  not be evaluated.   2. Right ventricular systolic function is mildly reduced. The right  ventricular size is mildly enlarged. There is moderately elevated  pulmonary artery systolic pressure. The estimated right ventricular  systolic pressure is 29.9 mmHg.   3. Left atrial size was severely dilated.   4. Right atrial size was mildly dilated.   5. A small pericardial effusion is present. The pericardial effusion is  circumferential.   6. The mitral valve is grossly normal. Moderate to severe mitral valve  regurgitation. No evidence of mitral stenosis.   7. Tricuspid valve regurgitation is severe.   8. The aortic valve is tricuspid. Aortic valve regurgitation is not  visualized. No aortic stenosis is present.   9. The inferior vena cava is dilated in size with <50% respiratory  variability, suggesting right atrial pressure of 15 mmHg.   Comparison(s): Changes from prior study are noted. EF now severely  reduced. Moderately dilated RV. Moderate to severe MR. Severe TR. RVSP ~49  mmHG.   Echo 10/09/20: IMPRESSIONS     1. Significant improvement in EF since echo done 05/17/20. Left  ventricular ejection fraction, by estimation, is 45 to 50%. The left  ventricle has mildly decreased function. The left ventricle demonstrates  global hypokinesis. The left ventricular  internal cavity size was mildly dilated. Left ventricular diastolic  parameters were normal.   2. ICD wires in RA/RV. Right ventricular systolic function is normal. The  right ventricular size is normal. There is mildly  elevated pulmonary  artery systolic pressure.   3. Left atrial size was moderately dilated.   4. The pericardial effusion is posterior to the left ventricle.   5. ? MR from restricted posterior leaflet motion . The mitral valve is  abnormal. Moderate mitral valve regurgitation. No evidence of mitral  stenosis.   6. Tricuspid valve regurgitation is moderate.   7. The aortic valve is tricuspid. Aortic valve regurgitation is trivial.  No aortic stenosis is present.   8. The inferior vena cava is normal in size with greater than 50%  respiratory variability, suggesting right atrial pressure of 3 mmHg.   Assessment / Plan: 1.  History of dialted Cardiomyopathy with chronic systolic  CHF.  EF  normal in April 2021. Has BiV pacemaker in place. Significant worsening of CHF in Feb. EF down to 25-30%.  Suspect this was mostly related to her Afib with RVR. Clinically improved.  Weight is stable  Swelling is mild today. Maintaining NSR on exam.  Repeat Echo in August showed improved EF to 45-50%. Will continue Lasix, losartan and Toprol. Not a candidate for SGLT 2 inhibitor due to history of UTIs.  Encouraged her to wear support hose.  2. NSVT. Follow up ICD/biV in device clinic. S/p generator change out in November 2020. Normal device function. Looks like she is due for device check.  3. Paroxysmal Afib. persistent and ERAD post cardioversion on May 06, 2020. RVR. Repeat DCCV post amiodarone. Now in NSR. [place] Vasc score of at least 4. On Xarelto. Needs to stay on amiodarone - will reduce dose to 100 mg daily given her concern about leg weakness but have counseled her on the risk of recurrent Afib.  4. Small AAA and left iliac aneurysm. Follow up with [facility]. Last doppler showed AAA was 2.9 cm.     5. Venous varicosities. Encourage sodium restriction and support hose.   6. Imbalance. Possibly exacerbated by amiodarone. Will reduce dose as noted. Other options for treating Afib limited. I have encouraged her  to use a cane with walking.    Follow up in 4 months.

## 2021-07-27 ENCOUNTER — Telehealth: Payer: Self-pay | Admitting: Cardiology

## 2021-07-27 ENCOUNTER — Ambulatory Visit: Payer: Medicare Other | Admitting: Cardiology

## 2021-07-27 NOTE — Telephone Encounter (Signed)
Called patient phone rings busy. 

## 2021-07-27 NOTE — Telephone Encounter (Signed)
Pt had to cancel appt for today and would like for the nurse to give her a call regarding a matter that she has going on. Please advise

## 2021-08-05 ENCOUNTER — Encounter: Payer: Medicare Other | Admitting: Physician Assistant

## 2021-08-11 ENCOUNTER — Ambulatory Visit: Payer: Medicare Other | Admitting: Family Medicine

## 2021-08-19 ENCOUNTER — Telehealth: Payer: Self-pay

## 2021-08-19 ENCOUNTER — Encounter: Payer: Medicare Other | Admitting: Physician Assistant

## 2021-08-19 NOTE — Telephone Encounter (Signed)
The patient called to cancel her appointment because she is not walking well. I offered to come to the car with a wheel chair so she can make her appointment. She already canceled her ride. I made her an appointment with [facility] help on 09/02/2021 at 3:35 pm. She agreed to show up to the appointment.

## 2021-08-19 NOTE — Progress Notes (Deleted)
Cardiology Office Note Date:  08/19/2021  Patient ID:  [name], [name] Jul 10, 1932, MRN [id number] PCP:  Wardell Honour, MD  Cardiologist:  Dr. [name] Electrophysiologist: Dr. Lovena Le   Chief Complaint:  *** annual visit  History of Present Illness: [name] is a 88 y.o. female with history of NICM, w/ CRT-P, LBBB, HTN, hypothyroidism, melanoma, anxiety/depressionsmall infrarenal AAA, following w/vascular surgery,  chronic abd discomfort after a number of abd surgeries,  PAFib.  She comes in today to be seen for Dr. Lovena Le, last saw him Sept 2021, described class II symptoms, , low BMI, trouble getting weight on, discussed caloric intake recommendations, no changes were made.  She had a HF admission Feb 2022, EF down in the environment of AFib w/RVR, diuresed, rate controlled, Tikosyn discussed but ultimately started on amiodarone.  Subsequent issues w/hyperkalemia resulted in spironolactone being stopped  F/u echo with improved EF to 45-50%  She saw Dr. [name] 03/23/21, generally weak, unsteady, continued Lasix, losartan and Toprol. Not a candidate for SGLT 2 inhibitor due to history of UTIs Rhythm control important, amio continued though dose reduced. With some leg weakness  She was hospitalized 04/07/21 - 04/10/21 after a mechanical fall suffering fractures of the right superior and left inferior pubic rami with moderate displacement, Orthopedics consulted: WBAT progress gradually with walker and 1-2 person assist.  No noted acute cardiac issues. Discharge to SNF   *** needs amio labs *** volume *** leg weakness?? Stop amio?? Further reduce?  QOD? *** bleeding, xarelto, dose *** AF burden *** CM meds   Device History: BSCi original device (ICD) implanted 05/14/05, gen change and downgrade to CRT-P, 04/26/12, gen change 01/26/2019   Past Medical History:  Diagnosis Date   AAA (abdominal aortic aneurysm)    2.8 cm 05/2018; 5 year [place] per ACR guidelines   Allergy     all year    Anxiety disorder    Arthritis    Barrett's esophagus 04/2008   Benign neoplasm of colon    Blood transfusion without reported diagnosis    long ago per pt   Cataract    removed both eyes   CHF (congestive heart failure) (HCC)    Chronic airway obstruction, not elsewhere classified    Complication of anesthesia    slow to wake up   Depressive disorder, not elsewhere classified    Diverticulitis    Dysthymic disorder    Erosive gastritis 08/2004   GERD (gastroesophageal reflux disease)    Hair thinning    HTN (hypertension)    pt denies    Hyperplastic polyps of stomach 06/2002   Hypothyroidism    ICD (implantable cardiac defibrillator) in place    BiV/ICD; s/p removal of ICD with insertion of BiV PPM 04/26/12   Incisional hernia    Insomnia 08/20/2015   Kyphosis 08/20/2015   LBBB (left bundle branch block)    Melanoma (HCC)    right arm   Mitral regurgitation    Nonischemic cardiomyopathy ([place])    EF initially 20%; Last measurement up to 50% per echo in August of 2012   Osteopenia    Other and unspecified hyperlipidemia    Other and unspecified hyperlipidemia    Pacemaker 04/26/2012   PAF (paroxysmal atrial fibrillation) ([place])    11/2016   Pain in joint, lower leg    Palpitations    Pneumonia    Restless legs syndrome (RLS)    Synovial cyst of popliteal space    Unspecified chronic  bronchitis ([place])    Unspecified nasal polyp    Unspecified sinusitis (chronic)     Past Surgical History:  Procedure Laterality Date   ABDOMINAL HYSTERECTOMY  1076   endometriosis   APPENDECTOMY     BI-VENTRICULAR PACEMAKER INSERTION N/A 04/26/2012   Procedure: BI-VENTRICULAR PACEMAKER INSERTION (CRT-P);  Surgeon: Evans Lance, MD;  [facility] CATH LAB;  Service: Cardiovascular;  Laterality: N/A;   BIV PACEMAKER GENERATOR CHANGEOUT N/A 01/26/2019   Procedure: BIV PACEMAKER GENERATOR CHANGEOUT;  Surgeon: Evans Lance, MD;  Location: [place] CV LAB;  Service:  Cardiovascular;  Laterality: N/A;   CARDIOVERSION N/A 05/06/2020   Procedure: CARDIOVERSION;  Surgeon: Pixie Casino, MD;  Location: [place];  Service: Cardiovascular;  Laterality: N/A;   CARDIOVERSION N/A 05/19/2020   Procedure: CARDIOVERSION;  Surgeon: Jerline Pain, MD;  Location: [facility] ENDOSCOPY;  Service: Cardiovascular;  Laterality: N/A;   COLONOSCOPY     defibrillator insertion     s/p removal of previously implanted BiV ICD and insertion of a new BiV pacemaker on 04/26/12   excise mole of lip  2001   Dr. Larena Sox   excision of wen  1984   Dr. Parks Ranger   FEMORAL HERNIA REPAIR  12/25/2012   Dr Dalbert Batman   FEMORAL HERNIA REPAIR  01/04/2013   Recurrent - Dr. Nash Mantis HERNIA REPAIR N/A 10/01/2013   Procedure: LAPAROSCOPIC INCISIONAL HERNIA;  Surgeon: Gayland Curry, MD;  Location: [facility];  Service: General;  Laterality: N/A;   INCISIONAL HERNIA REPAIR N/A 09/07/2018   Procedure: LAPAROSCOPIC ASSISTED REPAIR OF INCISIONAL HERNIA;  Surgeon: Greer Pickerel, MD;  Location: [place];  Service: General;  Laterality: N/A;   INGUINAL HERNIA REPAIR Right 09/26/2012   Procedure: RIGHT INGUINAL HERNIA REPAIR WITH MESH;  Surgeon: Odis Hollingshead, MD;  Location: [place];  Service: General;  Laterality: Right;   INGUINAL HERNIA REPAIR Right 12/25/2012   Procedure: explor right groin, small bowel rescection, tissue repair right femoral hernia;  Surgeon: Adin Hector, MD;  Location: [facility];  Service: General;  Laterality: Right;   INSERTION OF MESH Right 09/26/2012   Procedure: INSERTION OF MESH;  Surgeon: Odis Hollingshead, MD;  Location: [place];  Service: General;  Laterality: Right;   INSERTION OF MESH N/A 10/01/2013   Procedure: INSERTION OF MESH;  Surgeon: Gayland Curry, MD;  Location: [facility];  Service: General;  Laterality: N/A;   LAPAROSCOPY N/A 01/04/2013   Procedure: Diagnostic Laparoscopy, exploratory laparotomy with small bowel resection, closure of right femoral hernia repair;  Surgeon:  Madilyn Hook, DO;  Location: [facility];  Service: General;  Laterality: N/A;   Left arm surgery     POLYPECTOMY     RIGHT BREAST LUMPECTOMY  1988   Dr. Marylene Buerger   right knee surgery     arthroscopy: Dr. Shellia Carwin   TONSILLECTOMY     UPPER GASTROINTESTINAL ENDOSCOPY      Current Outpatient Medications  Medication Sig Dispense Refill   acetaminophen (TYLENOL) 500 MG tablet Take 1,000 mg by mouth every 8 (eight) hours as needed for moderate pain or mild pain.     amiodarone (PACERONE) 200 MG tablet Take 0.5 tablets (100 mg total) by mouth daily. 90 tablet 3   Ascorbic Acid (VITAMIN C) 1000 MG tablet Take 1,000 mg by mouth at bedtime. Ester C     Biotin 5000 MCG TABS Take 5,000 mcg by mouth daily.     Calcium Carbonate-Vitamin D (CALCIUM +  D PO) Take 2 tablets by mouth daily. 1000 units Vitamin D 1200 mg calcium     Cyanocobalamin (VITAMIN B 12) 500 MCG TABS Take 1,000 mcg by mouth daily.      estradiol (ESTRACE) 0.1 MG/GM vaginal cream Place 1 g vaginally 2 (two) times a week.     fexofenadine (ALLEGRA) 180 MG tablet Take 180 mg by mouth daily.     furosemide (LASIX) 20 MG tablet Take 1 tablet (20 mg total) by mouth daily. 30 tablet    lidocaine (LIDODERM) 5 % Place 1 patch onto the skin daily. Remove & Discard patch within 12 hours or as directed by MD 30 patch 0   losartan (COZAAR) 25 MG tablet TAKE 1 TABLET BY MOUTH EVERY DAY (Patient taking differently: Take 25 mg by mouth daily.) 30 tablet 11   metoprolol succinate (TOPROL-XL) 25 MG 24 hr tablet TAKE 1 TABLET BY MOUTH EVERY DAY (Patient taking differently: Take 25 mg by mouth daily.) 30 tablet 11   Polyethyl Glycol-Propyl Glycol (SYSTANE) 0.4-0.3 % SOLN Place 1 drop into both eyes daily as needed (dry eyes).     RABEprazole (ACIPHEX) 20 MG tablet Take one tablet by mouth once daily for stomach 90 tablet 1   SYNTHROID 100 MCG tablet TAKE 1 TABLET BY MOUTH DAILY BEFORE BREAKFAST. (Patient taking differently: Take 100 mcg by mouth daily  before breakfast.) 90 tablet 1   Vitamin D, Ergocalciferol, (DRISDOL) 1.25 MG (50000 UNIT) CAPS capsule Take 1 capsule (50,000 Units total) by mouth every 7 (seven) days. 5 capsule    XARELTO 15 MG TABS tablet TAKE 1 TABLET DAILY WITH   SUPPER (DOSE REDUCED) (Patient taking differently: Take 15 mg by mouth daily.) 90 tablet 3   No current facility-administered medications for this visit.    Allergies:   Hydrocodone, Cephalexin, Ciprofloxacin, Doxycycline, Escitalopram, Levofloxacin, Moxifloxacin, Ofloxacin, Sertraline, Sulfamethoxazole, Tape, Latex, and Neomycin   Social History:  The patient  reports that she quit smoking about 27 years ago. Her smoking use included cigarettes. She has a 15.00 pack-year smoking history. She has never used smokeless tobacco. She reports that she does not drink alcohol and does not use drugs.   Family History:  The patient's family history includes Breast cancer in her maternal aunt; Colon cancer in her cousin and paternal uncle; Heart disease in an other family member; Stroke in her father.  ROS:  Please see the history of present illness.  All other systems are reviewed and otherwise negative.   PHYSICAL EXAM:  VS:  There were no vitals taken for this visit. BMI: There is no height or weight on file to calculate BMI. Very thin, fragile body habitus,  in no acute distress, appears younger then her age  69: normocephalic, atraumatic  Neck: no JVD, carotid bruits or masses Cardiac:  *** RRR; no significant murmurs, no rubs, or gallops Lungs:  *** CTA b/l, no wheezing, rhonchi or rales  Abd: soft, nontender MS: no deformity, advanced atrophy even for her age Ext:  *** no edema  Skin: warm and dry, no rash Neuro:  No gross deficits appreciated Psych: euthymic mood, full affect  ***PPM site is stable, no tethering or discomfort, very thin, no erosion   EKG:  Not done today  Device interrogation today and reviewed by myself:   ***    Echocardiogram 05/17/2020: Impressions:  1. Left ventricular ejection fraction, by estimation, is 25 to 30%. The  left ventricle has severely decreased function. The left ventricle  demonstrates global hypokinesis. Left ventricular diastolic function could  not be evaluated.   2. Right ventricular systolic function is mildly reduced. The right  ventricular size is mildly enlarged. There is moderately elevated  pulmonary artery systolic pressure. The estimated right ventricular  systolic pressure is 01.0 mmHg.   3. Left atrial size was severely dilated.   4. Right atrial size was mildly dilated.   5. A small pericardial effusion is present. The pericardial effusion is  circumferential.   6. The mitral valve is grossly normal. Moderate to severe mitral valve  regurgitation. No evidence of mitral stenosis.   7. Tricuspid valve regurgitation is severe.   8. The aortic valve is tricuspid. Aortic valve regurgitation is not  visualized. No aortic stenosis is present.   9. The inferior vena cava is dilated in size with <50% respiratory  variability, suggesting right atrial pressure of 15 mmHg.   Comparison(s): Changes from prior study are noted. EF now severely  reduced. Moderately dilated RV. Moderate to severe MR. Severe TR. RVSP ~49  mmHG.    Echo 10/09/20: IMPRESSIONS   1. Significant improvement in EF since echo done 05/17/20. Left  ventricular ejection fraction, by estimation, is 45 to 50%. The left  ventricle has mildly decreased function. The left ventricle demonstrates  global hypokinesis. The left ventricular  internal cavity size was mildly dilated. Left ventricular diastolic  parameters were normal.   2. ICD wires in RA/RV. Right ventricular systolic function is normal. The  right ventricular size is normal. There is mildly elevated pulmonary  artery systolic pressure.   3. Left atrial size was moderately dilated.   4. The pericardial effusion is posterior to the  left ventricle.   5. ? MR from restricted posterior leaflet motion . The mitral valve is  abnormal. Moderate mitral valve regurgitation. No evidence of mitral  stenosis.   6. Tricuspid valve regurgitation is moderate.   7. The aortic valve is tricuspid. Aortic valve regurgitation is trivial.  No aortic stenosis is present.   8. The inferior vena cava is normal in size with greater than 50%  respiratory variability, suggesting right atrial pressure of 3 mmHg.     07/22/16: TTE Study Conclusions - Left ventricle: The cavity size was mildly dilated. Wall   thickness was normal. Systolic function was normal. The estimated   ejection fraction was in the range of 50% to 55%. Wall motion was   normal; there were no regional wall motion abnormalities. - Aortic valve: There was trivial regurgitation. - Mitral valve: There was mild regurgitation. - Atrial septum: No defect or patent foramen ovale was identified.    07/17/15: Echocardiogram Study Conclusions  - Left ventricle: The cavity size was normal. Systolic function was   normal. Wall motion was normal; there were no regional wall   motion abnormalities. - Aortic valve: There was trivial regurgitation. - Mitral valve: There was mild regurgitation. - Left atrium: The atrium was mildly dilated.  Recent Labs: 01/01/2021: TSH 4.330 04/08/2021: ALT 25; BUN 20; Creatinine, Ser 0.99; Magnesium 2.0; Potassium 3.5; Sodium 142 04/10/2021: Hemoglobin 10.8; Platelets 163  No results found for requested labs within last 365 days.   CrCl cannot be calculated (Patient's most recent lab result is older than the maximum 21 days allowed.).   Wt Readings from Last 3 Encounters:  04/10/21 119 lb 11.4 oz (54.3 kg)  03/23/21 128 lb 9.6 oz (58.3 kg)  01/13/21 129 lb (58.5 kg)     Other studies reviewed: Additional  studies/records reviewed today include: summarized above  ASSESSMENT AND PLAN:   1. New PAFib     CHA2DS2Vasc is at least 4, on ***  Xarelto      RVR has provoked reduction in EF and HF, rhythm control felt iperative     Amiodarone started Feb 2023     Dose reduce 2/2 some LE weakness, instability     *** % burden     ***  2. Hx NICM  3. LBBB     EF improved w/SR     *** No symptoms or exam findings to suggest volume OL     ***  4. HTN     ***Stable  5. PPM    ***         Disposition: ***  Current medicines are reviewed at length with the patient today.  The patient did not have any concerns regarding medicines.  Haywood Lasso, PA-C 08/19/2021 7:36 AM     [facility] [place] [address] [phone] (office)  [phone] (fax)

## 2021-08-21 ENCOUNTER — Ambulatory Visit: Payer: Medicare Other | Admitting: Orthopedic Surgery

## 2021-08-26 ENCOUNTER — Encounter: Payer: Self-pay | Admitting: Family

## 2021-08-26 ENCOUNTER — Ambulatory Visit (INDEPENDENT_AMBULATORY_CARE_PROVIDER_SITE_OTHER): Payer: Medicare Other | Admitting: Family

## 2021-08-26 VITALS — BP 122/70 | HR 55 | Temp 96.9°F | Resp 18 | Ht 68.05 in | Wt 125.0 lb

## 2021-08-26 DIAGNOSIS — E039 Hypothyroidism, unspecified: Secondary | ICD-10-CM

## 2021-08-26 DIAGNOSIS — I10 Essential (primary) hypertension: Secondary | ICD-10-CM | POA: Diagnosis not present

## 2021-08-26 DIAGNOSIS — I5022 Chronic systolic (congestive) heart failure: Secondary | ICD-10-CM | POA: Diagnosis not present

## 2021-08-26 DIAGNOSIS — R350 Frequency of micturition: Secondary | ICD-10-CM

## 2021-08-26 DIAGNOSIS — I4819 Other persistent atrial fibrillation: Secondary | ICD-10-CM | POA: Diagnosis not present

## 2021-08-26 DIAGNOSIS — F331 Major depressive disorder, recurrent, moderate: Secondary | ICD-10-CM | POA: Diagnosis not present

## 2021-08-26 DIAGNOSIS — K219 Gastro-esophageal reflux disease without esophagitis: Secondary | ICD-10-CM | POA: Diagnosis not present

## 2021-08-26 DIAGNOSIS — L659 Nonscarring hair loss, unspecified: Secondary | ICD-10-CM

## 2021-08-26 DIAGNOSIS — D508 Other iron deficiency anemias: Secondary | ICD-10-CM

## 2021-08-26 MED ORDER — BUPROPION HCL ER (SR) 150 MG PO TB12: 150.0000 mg | ORAL_TABLET | Freq: Every day | ORAL | 3 refills | Status: DC

## 2021-08-26 NOTE — Progress Notes (Signed)
Provider: Kendrick Haapala FNP-C  Frederica Kuster, MD  Patient Care Team: Frederica Kuster, MD as PCP - General (Family Medicine) [place], Peter M, MD as PCP - Cardiology (Cardiology) [place], Peter M, MD as Consulting Physician (Cardiology) Rachael Fee, MD as Attending Physician (Gastroenterology)  Extended Emergency Contact Information Primary Emergency Contact: dixon,mark Home Phone: [phone] Mobile Phone: [phone] Relation: Friend Secondary Emergency Contact: Benny Lennert [place] of [place] Home Phone: [phone] Relation: Friend  Code Status: Full Code  Goals of care: Advanced Directive information    08/26/2021   11:45 AM  Advanced Directives  Does Patient Have a Medical Advance Directive? No  Would patient like information on creating a medical advance directive? No - Patient declined     Chief Complaint  Patient presents with   Acute Visit    Patient is here for hair loss and would like lab work done. Patient is also having UTI symptoms and needs refill on levothyroxine and bupropion HCI SR    HPI:  Pt is a 88 y.o. female seen today for an acute visit for evaluation of hair loss would like lab work done.sees large amounts of hair whenever she brush her hair.Has also been noticed that the hair is thin.denies any heat/cold intolerance.  States appetite is good.but does not like to cook.  Larey Seat in January,2023 was seen in ED sustained right pubic rami fracture.she was discharged to [facility].  Also would like hemoglobin check for anemia.  Has not had any palpitation but once a while will feel rapid heart rate.Has upcoming appointment for pace maker check then will follow up with cardiologist.   Has had 4 lbs weight loss since January,2023.  She would like to restart her Wellbutrin SR 150 mg tablet which she was taking while in Rehab [facility].   Bilateral leg edema has improved.Has not been taking her Furosemide.     Past  Medical History:  Diagnosis Date   AAA (abdominal aortic aneurysm) (HCC)    2.8 cm 05/2018; 5 year [place] per ACR guidelines   Allergy    all year    Anxiety disorder    Arthritis    Barrett's esophagus 04/2008   Benign neoplasm of colon    Blood transfusion without reported diagnosis    long ago per pt   Cataract    removed both eyes   CHF (congestive heart failure) (HCC)    Chronic airway obstruction, not elsewhere classified    Complication of anesthesia    slow to wake up   Depressive disorder, not elsewhere classified    Diverticulitis    Dysthymic disorder    Erosive gastritis 08/2004   GERD (gastroesophageal reflux disease)    Hair thinning    HTN (hypertension)    pt denies    Hyperplastic polyps of stomach 06/2002   Hypothyroidism    ICD (implantable cardiac defibrillator) in place    BiV/ICD; s/p removal of ICD with insertion of BiV PPM 04/26/12   Incisional hernia    Insomnia 08/20/2015   Kyphosis 08/20/2015   LBBB (left bundle branch block)    Melanoma (HCC)    right arm   Mitral regurgitation    Nonischemic cardiomyopathy (HCC)    EF initially 20%; Last measurement up to 50% per echo in August of 2012   Osteopenia    Other and unspecified hyperlipidemia    Other and unspecified hyperlipidemia    Pacemaker 04/26/2012   PAF (paroxysmal atrial fibrillation) (HCC)  11/2016   Pain in joint, lower leg    Palpitations    Pneumonia    Restless legs syndrome (RLS)    Synovial cyst of popliteal space    Unspecified chronic bronchitis (HCC)    Unspecified nasal polyp    Unspecified sinusitis (chronic)    Past Surgical History:  Procedure Laterality Date   ABDOMINAL HYSTERECTOMY  1076   endometriosis   APPENDECTOMY     BI-VENTRICULAR PACEMAKER INSERTION N/A 04/26/2012   Procedure: BI-VENTRICULAR PACEMAKER INSERTION (CRT-P);  Surgeon: Evans Lance, MD;  Location: [facility] CATH LAB;  Service: Cardiovascular;  Laterality: N/A;   BIV PACEMAKER GENERATOR CHANGEOUT N/A  01/26/2019   Procedure: BIV PACEMAKER GENERATOR CHANGEOUT;  Surgeon: Evans Lance, MD;  Location: [place] CV LAB;  Service: Cardiovascular;  Laterality: N/A;   CARDIOVERSION N/A 05/06/2020   Procedure: CARDIOVERSION;  Surgeon: Pixie Casino, MD;  Location: [place];  Service: Cardiovascular;  Laterality: N/A;   CARDIOVERSION N/A 05/19/2020   Procedure: CARDIOVERSION;  Surgeon: Jerline Pain, MD;  Location: [facility] ENDOSCOPY;  Service: Cardiovascular;  Laterality: N/A;   COLONOSCOPY     defibrillator insertion     s/p removal of previously implanted BiV ICD and insertion of a new BiV pacemaker on 04/26/12   excise mole of lip  2001   Dr. Larena Sox   excision of wen  1984   Dr. Parks Ranger   FEMORAL HERNIA REPAIR  12/25/2012   Dr Dalbert Batman   FEMORAL HERNIA REPAIR  01/04/2013   Recurrent - Dr. Nash Mantis HERNIA REPAIR N/A 10/01/2013   Procedure: LAPAROSCOPIC INCISIONAL HERNIA;  Surgeon: Gayland Curry, MD;  Location: [facility];  Service: General;  Laterality: N/A;   INCISIONAL HERNIA REPAIR N/A 09/07/2018   Procedure: LAPAROSCOPIC ASSISTED REPAIR OF INCISIONAL HERNIA;  Surgeon: Greer Pickerel, MD;  Location: [place];  Service: General;  Laterality: N/A;   INGUINAL HERNIA REPAIR Right 09/26/2012   Procedure: RIGHT INGUINAL HERNIA REPAIR WITH MESH;  Surgeon: Odis Hollingshead, MD;  Location: [place];  Service: General;  Laterality: Right;   INGUINAL HERNIA REPAIR Right 12/25/2012   Procedure: explor right groin, small bowel rescection, tissue repair right femoral hernia;  Surgeon: Adin Hector, MD;  Location: [facility];  Service: General;  Laterality: Right;   INSERTION OF MESH Right 09/26/2012   Procedure: INSERTION OF MESH;  Surgeon: Odis Hollingshead, MD;  Location: [place];  Service: General;  Laterality: Right;   INSERTION OF MESH N/A 10/01/2013   Procedure: INSERTION OF MESH;  Surgeon: Gayland Curry, MD;  Location: [facility];  Service: General;  Laterality: N/A;   LAPAROSCOPY N/A 01/04/2013    Procedure: Diagnostic Laparoscopy, exploratory laparotomy with small bowel resection, closure of right femoral hernia repair;  Surgeon: Madilyn Hook, DO;  Location: [facility];  Service: General;  Laterality: N/A;   Left arm surgery     POLYPECTOMY     RIGHT BREAST LUMPECTOMY  1988   Dr. Marylene Buerger   right knee surgery     arthroscopy: Dr. Shellia Carwin   TONSILLECTOMY     UPPER GASTROINTESTINAL ENDOSCOPY      Allergies  Allergen Reactions   Hydrocodone Itching   Cephalexin Itching and Swelling   Ciprofloxacin Other (See Comments)    Not indicated due to aneurysm in aorta     Doxycycline Other (See Comments)    Unknown reaction   Escitalopram Other (See Comments)    Dry mouth   Levofloxacin Other (See  Comments)    Not indicated due to aneurysm in aorta    Moxifloxacin Other (See Comments)    Not indicated due to aneurysm in aorta    Ofloxacin Other (See Comments)    Unknown reaction   Sertraline Other (See Comments)    insomnia   Sulfamethoxazole Hives    Any sulfa meds.   Tape Other (See Comments)    Burns skin.    Latex Rash    "If pt. Makes contact with or wearing"   Neomycin Rash    Outpatient Encounter Medications as of 08/26/2021  Medication Sig   acetaminophen (TYLENOL) 500 MG tablet Take 1,000 mg by mouth every 8 (eight) hours as needed for moderate pain or mild pain.   amiodarone (PACERONE) 200 MG tablet Take 0.5 tablets (100 mg total) by mouth daily.   Ascorbic Acid (VITAMIN C) 1000 MG tablet Take 1,000 mg by mouth at bedtime. Ester C   Biotin 5000 MCG TABS Take 5,000 mcg by mouth daily.   buPROPion (WELLBUTRIN SR) 150 MG 12 hr tablet Take 1 tablet (150 mg total) by mouth daily.   Calcium Carbonate-Vitamin D (CALCIUM + D PO) Take 2 tablets by mouth daily. 1000 units Vitamin D 1200 mg calcium   Cyanocobalamin (VITAMIN B 12) 500 MCG TABS Take 1,000 mcg by mouth daily.    estradiol (ESTRACE) 0.1 MG/GM vaginal cream Place 1 g vaginally 2 (two) times a week.    fexofenadine (ALLEGRA) 180 MG tablet Take 180 mg by mouth daily.   furosemide (LASIX) 20 MG tablet Take 1 tablet (20 mg total) by mouth daily.   lidocaine (LIDODERM) 5 % Place 1 patch onto the skin daily. Remove & Discard patch within 12 hours or as directed by MD   losartan (COZAAR) 25 MG tablet TAKE 1 TABLET BY MOUTH EVERY DAY   metoprolol succinate (TOPROL-XL) 25 MG 24 hr tablet TAKE 1 TABLET BY MOUTH EVERY DAY   Polyethyl Glycol-Propyl Glycol (SYSTANE) 0.4-0.3 % SOLN Place 1 drop into both eyes daily as needed (dry eyes).   RABEprazole (ACIPHEX) 20 MG tablet Take one tablet by mouth once daily for stomach   SYNTHROID 100 MCG tablet TAKE 1 TABLET BY MOUTH DAILY BEFORE BREAKFAST.   Vitamin D, Ergocalciferol, (DRISDOL) 1.25 MG (50000 UNIT) CAPS capsule Take 1 capsule (50,000 Units total) by mouth every 7 (seven) days.   XARELTO 15 MG TABS tablet TAKE 1 TABLET DAILY WITH   SUPPER (DOSE REDUCED)   No facility-administered encounter medications on file as of 08/26/2021.    Review of Systems  Constitutional:  Negative for appetite change, chills, fatigue, fever and unexpected weight change.  HENT:  Negative for congestion, dental problem, ear discharge, ear pain, facial swelling, hearing loss, nosebleeds, postnasal drip, rhinorrhea, sinus pressure, sinus pain, sneezing, sore throat, tinnitus and trouble swallowing.   Eyes:  Negative for pain, discharge, redness, itching and visual disturbance.  Respiratory:  Negative for cough, chest tightness, shortness of breath and wheezing.   Cardiovascular:  Negative for chest pain, palpitations and leg swelling.  Gastrointestinal:  Negative for abdominal distention, abdominal pain, blood in stool, constipation, diarrhea, nausea and vomiting.  Endocrine: Negative for cold intolerance, heat intolerance, polydipsia, polyphagia and polyuria.  Genitourinary:  Positive for frequency. Negative for difficulty urinating, dysuria, flank pain and urgency.   Musculoskeletal:  Positive for gait problem. Negative for arthralgias, back pain, joint swelling, myalgias, neck pain and neck stiffness.  Skin:  Negative for color change, pallor, rash and wound.  Hair loss   Neurological:  Negative for dizziness, syncope, speech difficulty, weakness, light-headedness, numbness and headaches.  Hematological:  Does not bruise/bleed easily.  Psychiatric/Behavioral:  Negative for agitation, behavioral problems, confusion, hallucinations and sleep disturbance. The patient is not nervous/anxious.     Immunization History  Administered Date(s) Administered   Fluad Quad(high Dose 65+) 12/26/2018, 11/22/2019, 03/06/2021   Influenza Whole 12/28/2011, 03/08/2013   Influenza, High Dose Seasonal PF 03/14/2017, 12/08/2017   Influenza,inj,Quad PF,6+ Mos 01/08/2014, 11/20/2014, 01/26/2016   Moderna SARS-COV2 Booster Vaccination 01/22/2020   Moderna Sars-Covid-2 Vaccination 04/23/2019, 05/22/2019   Pneumococcal Polysaccharide-23 03/13/2008   Tdap 03/16/2011   Pertinent  Health Maintenance Due  Topic Date Due   INFLUENZA VACCINE  10/06/2021   MAMMOGRAM  11/19/2021   DEXA SCAN  Completed      04/08/2021    4:00 PM 04/08/2021   10:50 PM 04/10/2021    2:03 AM 04/10/2021   11:00 AM 08/26/2021    2:10 PM  Fall Risk  Falls in the past year?     1  Was there an injury with Fall?     1  Fall Risk Category Calculator     3  Fall Risk Category     High  Patient Fall Risk Level High fall risk High fall risk High fall risk High fall risk High fall risk  Patient at Risk for Falls Due to     History of fall(s);Impaired balance/gait;Impaired mobility  Fall risk Follow up     Falls evaluation completed   Functional Status Survey:    Vitals:   08/26/21 1405  BP: 122/70  Pulse: (!) 55  Resp: 18  Temp: (!) 96.9 F (36.1 C)  TempSrc: Temporal  SpO2: 99%  Weight: 125 lb (56.7 kg)  Height: 5' 8.05" (1.728 m)   Body mass index is 18.98 kg/m. Physical Exam Vitals  reviewed.  Constitutional:      General: She is not in acute distress.    Appearance: Normal appearance. She is underweight. She is not ill-appearing or diaphoretic.  HENT:     Head: Normocephalic.     Right Ear: Tympanic membrane, ear canal and external ear normal. There is no impacted cerumen.     Left Ear: Tympanic membrane, ear canal and external ear normal. There is no impacted cerumen.     Nose: Nose normal. No congestion or rhinorrhea.     Mouth/Throat:     Mouth: Mucous membranes are moist.     Pharynx: Oropharynx is clear. No oropharyngeal exudate or posterior oropharyngeal erythema.  Eyes:     General: No scleral icterus.       Right eye: No discharge.        Left eye: No discharge.     Extraocular Movements: Extraocular movements intact.     Conjunctiva/sclera: Conjunctivae normal.     Pupils: Pupils are equal, round, and reactive to light.  Neck:     Vascular: No carotid bruit.  Cardiovascular:     Rate and Rhythm: Normal rate.     Pulses: Normal pulses.     Heart sounds: Normal heart sounds. No murmur heard.    No friction rub. No gallop.     Comments: Pace maker  Pulmonary:     Effort: Pulmonary effort is normal. No respiratory distress.     Breath sounds: Normal breath sounds. No wheezing, rhonchi or rales.  Chest:     Chest wall: No tenderness.  Abdominal:     General: Bowel sounds  are normal. There is no distension.     Palpations: Abdomen is soft. There is no mass.     Tenderness: There is no abdominal tenderness. There is no right CVA tenderness, left CVA tenderness, guarding or rebound.  Musculoskeletal:        General: No swelling or tenderness. Normal range of motion.     Cervical back: Normal range of motion. No rigidity or tenderness.     Right lower leg: No edema.     Left lower leg: No edema.  Lymphadenopathy:     Cervical: No cervical adenopathy.  Skin:    General: Skin is warm and dry.     Coloration: Skin is not pale.     Findings: No  bruising, erythema, lesion or rash.  Neurological:     Mental Status: She is alert and oriented to person, place, and time.     Cranial Nerves: No cranial nerve deficit.     Sensory: No sensory deficit.     Motor: No weakness.     Coordination: Coordination normal.     Gait: Gait abnormal.  Psychiatric:        Mood and Affect: Mood normal.        Speech: Speech normal.        Behavior: Behavior normal.        Thought Content: Thought content normal.        Judgment: Judgment normal.     Labs reviewed: Recent Labs    04/07/21 1548 04/08/21 0414 08/26/21 1449  NA 143 142 141  K 4.1 3.5 4.3  CL 110 112* 107  CO2 24 21* 25  GLUCOSE 116* 113* 97  BUN 21 20 27*  CREATININE 1.10* 0.99 1.18*  CALCIUM 9.3 9.0 9.1  MG  --  2.0  --    Recent Labs    01/01/21 1429 04/07/21 1548 04/08/21 0414 08/26/21 1449  AST 21 53* 58* 12  ALT 12 24 25  5*  ALKPHOS 86 67 58  --   BILITOT 0.4 1.3* 1.1 0.4  PROT 6.5 5.9* 5.3* 6.3  ALBUMIN 4.6 3.5 3.1*  --    Recent Labs    04/07/21 1548 04/08/21 0414 04/10/21 0544 08/26/21 1449  WBC 9.7 9.5 9.9 6.0  NEUTROABS 7.9* 7.5  --  4,128  HGB 11.4* 10.9* 10.8* 11.2*  HCT 34.7* 32.3* 32.5* 33.2*  MCV 94.0 94.2 92.9 92.2  PLT 157 146* 163 211   Lab Results  Component Value Date   TSH 1.44 08/26/2021   No results found for: "HGBA1C" Lab Results  Component Value Date   CHOL 159 11/22/2019   HDL 53 11/22/2019   LDLCALC 82 11/22/2019   TRIG 137 11/22/2019   CHOLHDL 3.0 11/22/2019    Significant Diagnostic Results in last 30 days:  No results found.  Assessment/Plan 1. Hair loss Unclear etiology  - will check lab work to rule out thyroid disorder and other acute abnormalities  - TSH  2. Acquired hypothyroidism Lab Results  Component Value Date   TSH 1.44 08/26/2021   - continue on Synthroid 100 mcg tablet daily  - TSH  3. Moderate episode of recurrent major depressive disorder (HCC) Symptoms has worsen since she fell in  January sustaining right pubic rami fracture.would like to restart on Wellbutrin SR which worked for her while she was in the Rehab did not think she needed it when she left rehab.  Restart on Wellbutrin SR per pt request. - buPROPion (WELLBUTRIN SR) 150  MG 12 hr tablet; Take 1 tablet (150 mg total) by mouth daily.  Dispense: 30 tablet; Refill: 3 - Follow up in one month to re-evaluate   4. Primary hypertension B/p well controlled  - continue on metoprolol succinate  and losartan  - CBC with Differential/Platelet - CMP with eGFR([facility])  5. Persistent atrial fibrillation (HCC) Continue on Xarelto for anticoagulation  - continue on metoprolol and amiodarone   6. Gastroesophageal reflux disease without esophagitis Symptoms controlled. H/H stable.No tarry or black stool  - advised to avoid eating meals late in the evening and to avoid aggravating foods and spices. - continue on Rabeprazole  - CBC with Differential/Platelet  7. Iron deficiency anemia secondary to inadequate dietary iron intake Hgb 10.8  Will recheck labs  - increase foods high in iron such as dark leafy veggies and beets etc - CBC with Differential/Platelet  8. Chronic systolic CHF (congestive heart failure) (HCC) No signs of fluid overload  - continue on Furosemide daily.takes as needed  - CMP with eGFR([facility])  9. Urine frequency Afebrile  Will send urine for culture  - increase fluid intake  - Urine Culture  Family/ staff Communication: Reviewed plan of care with patient verbalized understanding   Labs/tests ordered:  - CBC with Differential/Platelet - CMP with eGFR([facility]) - TSH  Next Appointment: Return in about 1 month (around 09/25/2021) for depression .   Sandrea Hughs, NP

## 2021-08-28 ENCOUNTER — Other Ambulatory Visit: Payer: Self-pay

## 2021-08-28 DIAGNOSIS — E039 Hypothyroidism, unspecified: Secondary | ICD-10-CM

## 2021-08-28 DIAGNOSIS — L659 Nonscarring hair loss, unspecified: Secondary | ICD-10-CM

## 2021-08-28 LAB — COMPLETE METABOLIC PANEL WITH GFR
AG Ratio: 1.9 (calc) (ref 1.0–2.5)
ALT: 5 U/L — ABNORMAL LOW (ref 6–29)
AST: 12 U/L (ref 10–35)
Albumin: 4.1 g/dL (ref 3.6–5.1)
Alkaline phosphatase (APISO): 77 U/L (ref 37–153)
BUN/Creatinine Ratio: 23 (calc) — ABNORMAL HIGH (ref 6–22)
BUN: 27 mg/dL — ABNORMAL HIGH (ref 7–25)
CO2: 25 mmol/L (ref 20–32)
Calcium: 9.1 mg/dL (ref 8.6–10.4)
Chloride: 107 mmol/L (ref 98–110)
Creat: 1.18 mg/dL — ABNORMAL HIGH (ref 0.60–0.95)
Globulin: 2.2 g/dL (calc) (ref 1.9–3.7)
Glucose, Bld: 97 mg/dL (ref 65–139)
Potassium: 4.3 mmol/L (ref 3.5–5.3)
Sodium: 141 mmol/L (ref 135–146)
Total Bilirubin: 0.4 mg/dL (ref 0.2–1.2)
Total Protein: 6.3 g/dL (ref 6.1–8.1)
eGFR: 45 mL/min/{1.73_m2} — ABNORMAL LOW (ref >=60)

## 2021-08-28 LAB — CBC WITH DIFFERENTIAL/PLATELET
Absolute Monocytes: 660 cells/uL (ref 200–950)
Basophils Absolute: 18 cells/uL (ref 0–200)
Basophils Relative: 0.3 %
Eosinophils Absolute: 462 cells/uL (ref 15–500)
Eosinophils Relative: 7.7 %
HCT: 33.2 % — ABNORMAL LOW (ref 35.0–45.0)
Hemoglobin: 11.2 g/dL — ABNORMAL LOW (ref 11.7–15.5)
Lymphs Abs: 732 cells/uL — ABNORMAL LOW (ref 850–3900)
MCH: 31.1 pg (ref 27.0–33.0)
MCHC: 33.7 g/dL (ref 32.0–36.0)
MCV: 92.2 fL (ref 80.0–100.0)
MPV: 10 fL (ref 7.5–12.5)
Monocytes Relative: 11 %
Neutro Abs: 4128 cells/uL (ref 1500–7800)
Neutrophils Relative %: 68.8 %
Platelets: 211 10*3/uL (ref 140–400)
RBC: 3.6 10*6/uL — ABNORMAL LOW (ref 3.80–5.10)
RDW: 12.9 % (ref 11.0–15.0)
Total Lymphocyte: 12.2 %
WBC: 6 10*3/uL (ref 3.8–10.8)

## 2021-08-28 LAB — URINE CULTURE
MICRO NUMBER:: 13554690
SPECIMEN QUALITY:: ADEQUATE

## 2021-08-28 LAB — TSH: TSH: 1.44 mIU/L (ref 0.40–4.50)

## 2021-08-28 MED ORDER — LEVOTHYROXINE SODIUM 100 MCG PO TABS: 100.0000 ug | ORAL_TABLET | Freq: Every day | ORAL | 1 refills | Status: DC

## 2021-08-28 NOTE — Telephone Encounter (Signed)
Patient was calling and asking if the dosage of her synthroid was going to change per recent labs. TSH was normal, no change in medication was  indicated. Patient to continue current dose at this time.

## 2021-08-30 ENCOUNTER — Other Ambulatory Visit: Payer: Self-pay | Admitting: Cardiology

## 2021-08-30 NOTE — Progress Notes (Signed)
Cardiology Office Note Date:  08/30/2021  Patient ID:  [name], [name] 09-19-1932, MRN [id number] PCP:  Wardell Honour, MD  Cardiologist:  Dr. [name] Electrophysiologist: Dr. Lovena Le   Chief Complaint:   annual visit  History of Present Illness: [name] is a 88 y.o. female with history of NICM, w/ CRT-P, LBBB, HTN, hypothyroidism, melanoma, anxiety/depressionsmall infrarenal AAA, following w/vascular surgery,  chronic abd discomfort after a number of abd surgeries,  PAFib.  She comes in today to be seen for Dr. Lovena Le, last saw him Sept 2021, described class II symptoms, , low BMI, trouble getting weight on, discussed caloric intake recommendations, no changes were made.  She had a HF admission Feb 2022, EF down in the environment of AFib w/RVR, diuresed, rate controlled, Tikosyn discussed but ultimately started on amiodarone.  Subsequent issues w/hyperkalemia resulted in spironolactone being stopped  F/u echo with improved EF to 45-50%  She saw Dr. [name] 03/23/21, generally weak, unsteady, continued Lasix, losartan and Toprol. Not a candidate for SGLT 2 inhibitor due to history of UTIs Rhythm control important, amio continued though dose reduced. With some leg weakness  She was hospitalized 04/07/21 - 04/10/21 after a mechanical fall suffering fractures of the right superior and left inferior pubic rami with moderate displacement, Orthopedics consulted: WBAT progress gradually with walker and 1-2 person assist.  No noted acute cardiac issues. Discharge to SNF  Saw he PMD 08/26/21 for c/u urinary symptoms as well as hair  and discuss restarting wellbutrin (was restarted) and planned for labs   TODAY She is generally feeling poorly, feels weaker then she used to, legs feel weak, and a bit off balance She has not fallen any more. She had rehab after her fall and fracture but did not help much She has lost weight despite eating while she does not eat much she eats anything  she wants when she does. She is tearful today, just does not want to be so frail She has known arthritis but feel like this is beyond that No CP, no SOB No near syncope or syncope No bleeding  She has lost hair and this bothers her as well    Device History: BSCi original device (ICD) implanted 05/14/05, gen change and downgrade to CRT-P, 04/26/12, gen change 01/26/2019   Past Medical History:  Diagnosis Date   AAA (abdominal aortic aneurysm) (HCC)    2.8 cm 05/2018; 5 year [place] per ACR guidelines   Allergy    all year    Anxiety disorder    Arthritis    Barrett's esophagus 04/2008   Benign neoplasm of colon    Blood transfusion without reported diagnosis    long ago per pt   Cataract    removed both eyes   CHF (congestive heart failure) ([place])    Chronic airway obstruction, not elsewhere classified    Complication of anesthesia    slow to wake up   Depressive disorder, not elsewhere classified    Diverticulitis    Dysthymic disorder    Erosive gastritis 08/2004   GERD (gastroesophageal reflux disease)    Hair thinning    HTN (hypertension)    pt denies    Hyperplastic polyps of stomach 06/2002   Hypothyroidism    ICD (implantable cardiac defibrillator) in place    BiV/ICD; s/p removal of ICD with insertion of BiV PPM 04/26/12   Incisional hernia    Insomnia 08/20/2015   Kyphosis 08/20/2015   LBBB (left bundle branch block)  Melanoma ([place])    right arm   Mitral regurgitation    Nonischemic cardiomyopathy ([place])    EF initially 20%; Last measurement up to 50% per echo in August of 2012   Osteopenia    Other and unspecified hyperlipidemia    Other and unspecified hyperlipidemia    Pacemaker 04/26/2012   PAF (paroxysmal atrial fibrillation) ([place])    11/2016   Pain in joint, lower leg    Palpitations    Pneumonia    Restless legs syndrome (RLS)    Synovial cyst of popliteal space    Unspecified chronic bronchitis (HCC)    Unspecified nasal polyp    Unspecified  sinusitis (chronic)     Past Surgical History:  Procedure Laterality Date   ABDOMINAL HYSTERECTOMY  1076   endometriosis   APPENDECTOMY     BI-VENTRICULAR PACEMAKER INSERTION N/A 04/26/2012   Procedure: BI-VENTRICULAR PACEMAKER INSERTION (CRT-P);  Surgeon: Evans Lance, MD;  Location: [facility] CATH LAB;  Service: Cardiovascular;  Laterality: N/A;   BIV PACEMAKER GENERATOR CHANGEOUT N/A 01/26/2019   Procedure: BIV PACEMAKER GENERATOR CHANGEOUT;  Surgeon: Evans Lance, MD;  Location: [place] CV LAB;  Service: Cardiovascular;  Laterality: N/A;   CARDIOVERSION N/A 05/06/2020   Procedure: CARDIOVERSION;  Surgeon: Pixie Casino, MD;  Location: [place];  Service: Cardiovascular;  Laterality: N/A;   CARDIOVERSION N/A 05/19/2020   Procedure: CARDIOVERSION;  Surgeon: Jerline Pain, MD;  Location: [facility] ENDOSCOPY;  Service: Cardiovascular;  Laterality: N/A;   COLONOSCOPY     defibrillator insertion     s/p removal of previously implanted BiV ICD and insertion of a new BiV pacemaker on 04/26/12   excise mole of lip  2001   Dr. Larena Sox   excision of wen  1984   Dr. Parks Ranger   FEMORAL HERNIA REPAIR  12/25/2012   Dr Dalbert Batman   FEMORAL HERNIA REPAIR  01/04/2013   Recurrent - Dr. Nash Mantis HERNIA REPAIR N/A 10/01/2013   Procedure: LAPAROSCOPIC INCISIONAL HERNIA;  Surgeon: Gayland Curry, MD;  Location: [facility];  Service: General;  Laterality: N/A;   INCISIONAL HERNIA REPAIR N/A 09/07/2018   Procedure: LAPAROSCOPIC ASSISTED REPAIR OF INCISIONAL HERNIA;  Surgeon: Greer Pickerel, MD;  Location: [place];  Service: General;  Laterality: N/A;   INGUINAL HERNIA REPAIR Right 09/26/2012   Procedure: RIGHT INGUINAL HERNIA REPAIR WITH MESH;  Surgeon: Odis Hollingshead, MD;  Location: [place];  Service: General;  Laterality: Right;   INGUINAL HERNIA REPAIR Right 12/25/2012   Procedure: explor right groin, small bowel rescection, tissue repair right femoral hernia;  Surgeon: Adin Hector, MD;  Location: [facility];  Service: General;  Laterality: Right;   INSERTION OF MESH Right 09/26/2012   Procedure: INSERTION OF MESH;  Surgeon: Odis Hollingshead, MD;  Location: [place];  Service: General;  Laterality: Right;   INSERTION OF MESH N/A 10/01/2013   Procedure: INSERTION OF MESH;  Surgeon: Gayland Curry, MD;  Location: [facility];  Service: General;  Laterality: N/A;   LAPAROSCOPY N/A 01/04/2013   Procedure: Diagnostic Laparoscopy, exploratory laparotomy with small bowel resection, closure of right femoral hernia repair;  Surgeon: Madilyn Hook, DO;  Location: [facility];  Service: General;  Laterality: N/A;   Left arm surgery     POLYPECTOMY     RIGHT BREAST LUMPECTOMY  1988   Dr. Marylene Buerger   right knee surgery     arthroscopy: Dr. Shellia Carwin   TONSILLECTOMY  UPPER GASTROINTESTINAL ENDOSCOPY      Current Outpatient Medications  Medication Sig Dispense Refill   acetaminophen (TYLENOL) 500 MG tablet Take 1,000 mg by mouth every 8 (eight) hours as needed for moderate pain or mild pain.     amiodarone (PACERONE) 200 MG tablet Take 0.5 tablets (100 mg total) by mouth daily. 90 tablet 3   Ascorbic Acid (VITAMIN C) 1000 MG tablet Take 1,000 mg by mouth at bedtime. Ester C     Biotin 5000 MCG TABS Take 5,000 mcg by mouth daily.     buPROPion (WELLBUTRIN SR) 150 MG 12 hr tablet Take 1 tablet (150 mg total) by mouth daily. 30 tablet 3   Calcium Carbonate-Vitamin D (CALCIUM + D PO) Take 2 tablets by mouth daily. 1000 units Vitamin D 1200 mg calcium     Cyanocobalamin (VITAMIN B 12) 500 MCG TABS Take 1,000 mcg by mouth daily.      estradiol (ESTRACE) 0.1 MG/GM vaginal cream Place 1 g vaginally 2 (two) times a week.     fexofenadine (ALLEGRA) 180 MG tablet Take 180 mg by mouth daily.     furosemide (LASIX) 20 MG tablet Take 1 tablet (20 mg total) by mouth daily. 30 tablet    levothyroxine (SYNTHROID) 100 MCG tablet Take 1 tablet (100 mcg total) by mouth daily before breakfast. 90 tablet 1   lidocaine (LIDODERM) 5 %  Place 1 patch onto the skin daily. Remove & Discard patch within 12 hours or as directed by MD 30 patch 0   losartan (COZAAR) 25 MG tablet TAKE 1 TABLET BY MOUTH EVERY DAY 30 tablet 11   metoprolol succinate (TOPROL-XL) 25 MG 24 hr tablet TAKE 1 TABLET BY MOUTH EVERY DAY 30 tablet 11   Polyethyl Glycol-Propyl Glycol (SYSTANE) 0.4-0.3 % SOLN Place 1 drop into both eyes daily as needed (dry eyes).     RABEprazole (ACIPHEX) 20 MG tablet Take one tablet by mouth once daily for stomach 90 tablet 1   Vitamin D, Ergocalciferol, (DRISDOL) 1.25 MG (50000 UNIT) CAPS capsule Take 1 capsule (50,000 Units total) by mouth every 7 (seven) days. 5 capsule    XARELTO 15 MG TABS tablet TAKE 1 TABLET DAILY WITH   SUPPER (DOSE REDUCED) 90 tablet 3   No current facility-administered medications for this visit.    Allergies:   Hydrocodone, Cephalexin, Ciprofloxacin, Doxycycline, Escitalopram, Levofloxacin, Moxifloxacin, Ofloxacin, Sertraline, Sulfamethoxazole, Tape, Latex, and Neomycin   Social History:  The patient  reports that she quit smoking about 27 years ago. Her smoking use included cigarettes. She has a 15.00 pack-year smoking history. She has never used smokeless tobacco. She reports that she does not drink alcohol and does not use drugs.   Family History:  The patient's family history includes Breast cancer in her maternal aunt; Colon cancer in her cousin and paternal uncle; Heart disease in an other family member; Stroke in her father.  ROS:  Please see the history of present illness.  All other systems are reviewed and otherwise negative.   PHYSICAL EXAM:  VS:  There were no vitals taken for this visit. BMI: There is no height or weight on file to calculate BMI. Very thin, fragile body habitus,  in no acute distress, appears younger then her age  62: normocephalic, atraumatic  Neck: no JVD, carotid bruits or masses Cardiac:   RRR; no significant murmurs, no rubs, or gallops Lungs:  CTA b/l, no  wheezing, rhonchi or rales  Abd: soft, nontender MS: no deformity,  advanced atrophy even for her age Ext:  no edema  Skin: warm and dry, no rash Neuro:  No gross deficits appreciated Psych: euthymic mood, full affect  PM site is stable, no tethering or discomfort, very thin, no erosion or skin thinning is noted   EKG:  Not done today  Device interrogation today and reviewed by myself:  Battery is OK Lead measurements are all chronically high and today's measurements are stable (slightly lower then last) No Afib One NSVT (15 seconds) back in feb   Echo 10/09/20: IMPRESSIONS   1. Significant improvement in EF since echo done 05/17/20. Left  ventricular ejection fraction, by estimation, is 45 to 50%. The left  ventricle has mildly decreased function. The left ventricle demonstrates  global hypokinesis. The left ventricular  internal cavity size was mildly dilated. Left ventricular diastolic  parameters were normal.   2. ICD wires in RA/RV. Right ventricular systolic function is normal. The  right ventricular size is normal. There is mildly elevated pulmonary  artery systolic pressure.   3. Left atrial size was moderately dilated.   4. The pericardial effusion is posterior to the left ventricle.   5. ? MR from restricted posterior leaflet motion . The mitral valve is  abnormal. Moderate mitral valve regurgitation. No evidence of mitral  stenosis.   6. Tricuspid valve regurgitation is moderate.   7. The aortic valve is tricuspid. Aortic valve regurgitation is trivial.  No aortic stenosis is present.   8. The inferior vena cava is normal in size with greater than 50%  respiratory variability, suggesting right atrial pressure of 3 mmHg.   Echocardiogram 05/17/2020: Impressions:  1. Left ventricular ejection fraction, by estimation, is 25 to 30%. The  left ventricle has severely decreased function. The left ventricle  demonstrates global hypokinesis. Left ventricular diastolic  function could  not be evaluated.   2. Right ventricular systolic function is mildly reduced. The right  ventricular size is mildly enlarged. There is moderately elevated  pulmonary artery systolic pressure. The estimated right ventricular  systolic pressure is 78.9 mmHg.   3. Left atrial size was severely dilated.   4. Right atrial size was mildly dilated.   5. A small pericardial effusion is present. The pericardial effusion is  circumferential.   6. The mitral valve is grossly normal. Moderate to severe mitral valve  regurgitation. No evidence of mitral stenosis.   7. Tricuspid valve regurgitation is severe.   8. The aortic valve is tricuspid. Aortic valve regurgitation is not  visualized. No aortic stenosis is present.   9. The inferior vena cava is dilated in size with <50% respiratory  variability, suggesting right atrial pressure of 15 mmHg.   Comparison(s): Changes from prior study are noted. EF now severely  reduced. Moderately dilated RV. Moderate to severe MR. Severe TR. RVSP ~49  mmHG.       07/22/16: TTE Study Conclusions - Left ventricle: The cavity size was mildly dilated. Wall   thickness was normal. Systolic function was normal. The estimated   ejection fraction was in the range of 50% to 55%. Wall motion was   normal; there were no regional wall motion abnormalities. - Aortic valve: There was trivial regurgitation. - Mitral valve: There was mild regurgitation. - Atrial septum: No defect or patent foramen ovale was identified.    07/17/15: Echocardiogram Study Conclusions  - Left ventricle: The cavity size was normal. Systolic function was   normal. Wall motion was normal; there were no regional wall  motion abnormalities. - Aortic valve: There was trivial regurgitation. - Mitral valve: There was mild regurgitation. - Left atrium: The atrium was mildly dilated.  Recent Labs: 04/08/2021: Magnesium 2.0 08/26/2021: ALT 5; BUN 27; Creat 1.18; Hemoglobin 11.2;  Platelets 211; Potassium 4.3; Sodium 141; TSH 1.44  No results found for requested labs within last 365 days.   Estimated Creatinine Clearance: 30.6 mL/min (A) (by C-G formula based on SCr of 1.18 mg/dL (H)).   Wt Readings from Last 3 Encounters:  08/26/21 125 lb (56.7 kg)  04/10/21 119 lb 11.4 oz (54.3 kg)  03/23/21 128 lb 9.6 oz (58.3 kg)     Other studies reviewed: Additional studies/records reviewed today include: summarized above  ASSESSMENT AND PLAN:   1. PAFib     CHA2DS2Vasc is at least 4, on  Xarelto appropriately dosed     RVR has provoked reduction in EF and HF, rhythm control felt imperative     Amiodarone started Feb 2023     Dose reduce 2/2 some LE weakness, instability     0 % burden      While I suspect her functional decline is perhaps multifactorial, she is concerned about tha miodarone playing a part Discussed stopping or reducing her further reducing amio dose though she is mor fearful of Afib and thinks Dr. [name] would not want [place] to change her amiodarone  Encouraged f/u with her PMD, consider further PT  2. Hx NICM  3. LBBB     EF improved w/SR     No symptoms or exam findings to suggest volume OL     C/w dr. [name]  She is on a BB, this perhaps contributes to her hair loss (?) and with her BP on the lower side will reduce the dose to 12.5 mg daily  4. HTN    Lower side today  5. PPM    Intact function with stable lead measurements that are chronically elevated    No P waves at 40 today some at 30    AP 82%    No changes were made today       Disposition: She is anxious about medication change and would like early follow up will have her see us/EP or cardiology back in about 6 weeks, sooner if needed, otherwise remotes as usual and EP in cl niic in a year, sooner if needed  Current medicines are reviewed at length with the patient today.  The patient did not have any concerns regarding medicines.  Haywood Lasso, PA-C 08/30/2021 4:30 PM      [address] [place] [address] [phone] (office)  [phone] (fax)

## 2021-08-31 ENCOUNTER — Telehealth: Payer: Self-pay | Admitting: *Deleted

## 2021-08-31 NOTE — Telephone Encounter (Signed)
See lab results.  

## 2021-09-01 ENCOUNTER — Other Ambulatory Visit: Payer: Self-pay | Admitting: Cardiology

## 2021-09-01 MED ORDER — NITROFURANTOIN MACROCRYSTAL 100 MG PO CAPS: 100.0000 mg | ORAL_CAPSULE | Freq: Two times a day (BID) | ORAL | 0 refills | Status: DC

## 2021-09-01 MED ORDER — SACCHAROMYCES BOULARDII 250 MG PO CAPS: 250.0000 mg | ORAL_CAPSULE | Freq: Two times a day (BID) | ORAL | 0 refills | Status: DC

## 2021-09-01 NOTE — Telephone Encounter (Signed)
-   d/c Keflex  - Start on Nitrofurantoin 100 mg capsule one by mouth twice daily x 7 days for UTI

## 2021-09-02 ENCOUNTER — Telehealth: Payer: Self-pay | Admitting: Cardiology

## 2021-09-02 ENCOUNTER — Encounter: Payer: Self-pay | Admitting: Physician Assistant

## 2021-09-02 ENCOUNTER — Telehealth: Payer: Self-pay

## 2021-09-02 ENCOUNTER — Ambulatory Visit (INDEPENDENT_AMBULATORY_CARE_PROVIDER_SITE_OTHER): Payer: Medicare Other | Admitting: Physician Assistant

## 2021-09-02 VITALS — BP 104/60 | HR 56 | Ht 68.5 in | Wt 122.4 lb

## 2021-09-02 DIAGNOSIS — I480 Paroxysmal atrial fibrillation: Secondary | ICD-10-CM | POA: Diagnosis not present

## 2021-09-02 DIAGNOSIS — I428 Other cardiomyopathies: Secondary | ICD-10-CM

## 2021-09-02 DIAGNOSIS — I10 Essential (primary) hypertension: Secondary | ICD-10-CM

## 2021-09-02 LAB — CUP PACEART INCLINIC DEVICE CHECK
Date Time Interrogation Session: 20230628190703
Implantable Lead Implant Date: 20070309
Implantable Lead Implant Date: 20070309
Implantable Lead Implant Date: 20070309
Implantable Lead Location: 753858
Implantable Lead Location: 753859
Implantable Lead Location: 753860
Implantable Lead Model: 158
Implantable Lead Model: 4087
Implantable Lead Model: 4543
Implantable Lead Serial Number: 121652
Implantable Lead Serial Number: 168416
Implantable Lead Serial Number: 268502
Implantable Pulse Generator Implant Date: 20201120
Lead Channel Impedance Value: 403 Ohm
Lead Channel Impedance Value: 442 Ohm
Lead Channel Impedance Value: 547 Ohm
Lead Channel Pacing Threshold Amplitude: 1.4 V
Lead Channel Pacing Threshold Amplitude: 1.9 V
Lead Channel Pacing Threshold Amplitude: 2.7 V
Lead Channel Pacing Threshold Pulse Width: 0.6 ms
Lead Channel Pacing Threshold Pulse Width: 1 ms
Lead Channel Pacing Threshold Pulse Width: 1 ms
Lead Channel Sensing Intrinsic Amplitude: 16.5 mV
Lead Channel Sensing Intrinsic Amplitude: 4.7 mV
Lead Channel Setting Pacing Amplitude: 2.5 V
Lead Channel Setting Pacing Amplitude: 2.6 V
Lead Channel Setting Pacing Amplitude: 4 V
Lead Channel Setting Pacing Pulse Width: 1 ms
Lead Channel Setting Pacing Pulse Width: 1 ms
Lead Channel Setting Sensing Sensitivity: 2.5 mV
Lead Channel Setting Sensing Sensitivity: 4 mV
Pulse Gen Serial Number: 714246

## 2021-09-02 MED ORDER — METOPROLOL SUCCINATE ER 25 MG PO TB24: 12.5000 mg | ORAL_TABLET | Freq: Every day | ORAL | 3 refills | Status: DC

## 2021-09-02 NOTE — Telephone Encounter (Signed)
*  STAT* If patient is at the pharmacy, call can be transferred to refill team.   1. Which medications need to be refilled? (please list name of each medication and dose if known)   metoprolol succinate (TOPROL-XL) 25 MG 24 hr tablet    2. Which pharmacy/location (including street and city if local pharmacy) is medication to be sent to?  [id number] [place] [facility] [address]. Do they need a 30 day or 90 day supply? 90  Pt is out of medication

## 2021-09-02 NOTE — Telephone Encounter (Signed)
[facility] says Macrodantin BID (q 6 hours) was sent over but should be macrobid every 12 hours. To [facility] to resend/approve

## 2021-09-02 NOTE — Telephone Encounter (Signed)
-   Nitrofurantoin 100 mg capsule one by mouth twice daily x 7 days was send 09/01/2021.  -Please resend Nitrofurantoin to pharmacy.

## 2021-09-02 NOTE — Patient Instructions (Signed)
Medication Instructions:  Your physician has recommended you make the following change in your medication:   DECREASE the Metporolol (Toprol) to 1/2 tablet daily  *If you need a refill on your cardiac medications before your next appointment, please call your pharmacy*   Lab Work: None ordered  If you have labs (blood work) drawn today and your tests are completely normal, you will receive your results only by: [place] (if you have MyChart) OR A paper copy in the mail If you have any lab test that is abnormal or we need to change your treatment, we will call you to review the results.   Testing/Procedures: None ordered   Follow-Up: At [facility], you and your health needs are our priority.  As part of our continuing mission to provide you with exceptional heart care, we have created designated Provider Care Teams.  These Care Teams include your primary Cardiologist (physician) and Advanced Practice Providers (APPs -  Physician Assistants and Nurse Practitioners) who all work together to provide you with the care you need, when you need it.  We recommend signing up for the patient portal called "MyChart".  Sign up information is provided on this After Visit Summary.  MyChart is used to connect with patients for Virtual Visits (Telemedicine).  Patients are able to view lab/test results, encounter notes, upcoming appointments, etc.  Non-urgent messages can be sent to your provider as well.   To learn more about what you can do with MyChart, go to [email or web address].    Your next appointment:   12 month(s)  The format for your next appointment:   In Person  Provider:   Cristopher Peru, MD or Tommye Standard, PA-C    Other Instructions   Important Information About Sugar

## 2021-09-03 NOTE — Telephone Encounter (Signed)
Discussed with [facility] at [facility] response. She says its been changed already.

## 2021-09-10 ENCOUNTER — Telehealth: Payer: Self-pay

## 2021-09-10 NOTE — Telephone Encounter (Signed)
Patient had UTI. Took the Nitrofurantoin but it didn't workthis time. She finished it yesterday. Still having frequency and feeling irritation (same symptoms). Patient states she can't take a lot of things because of her allergies. Patient is unable to come into the office.

## 2021-09-10 NOTE — Telephone Encounter (Signed)
Consider referral to urology for evaluation of frequent UTI's

## 2021-09-11 NOTE — Telephone Encounter (Signed)
Recommend recollecting urine specimen to evaluate if still has infection.

## 2021-09-11 NOTE — Telephone Encounter (Signed)
Called the patient. She declined referral to urologist. She states she has seen a urologist many of times.   Patient questions, can something else be called in that she is not allergic to?

## 2021-09-11 NOTE — Telephone Encounter (Signed)
Can send POA to pick up cup then return specimen to the office cannot treat without culture and sensitivity.

## 2021-09-11 NOTE — Telephone Encounter (Signed)
Due to a fall the beginning of Feb. she hasn't been driving and doesn't have transportation. Hopefully she will be soon. To [facility] for [facility]

## 2021-09-15 NOTE — Telephone Encounter (Signed)
Take over-the-counter cranberry 475 mg tablet 1 by mouth twice daily for UTI prophylaxis.

## 2021-09-15 NOTE — Telephone Encounter (Signed)
Patient called office and stated that she has not heard anything from anyone regarding her message from last week.   Stated that she still has some symptoms of UTI after finishing the antibiotic.   I informed her of Dinah's message about sending someone to pick up a specimen cup to recheck her urine and she stated that she has no one to pick up the cup and she is not driving at the moment. Stated that her battery is dead on her car.   Patient stated that she would just increase her water intake and call if symptoms worsens. Stated that she will wait till her upcoming appointment with Dr. Sabra Heck in August.

## 2021-09-15 NOTE — Telephone Encounter (Signed)
Patient notified and agreed.  

## 2021-09-17 ENCOUNTER — Other Ambulatory Visit: Payer: Self-pay | Admitting: *Deleted

## 2021-09-17 DIAGNOSIS — K219 Gastro-esophageal reflux disease without esophagitis: Secondary | ICD-10-CM

## 2021-09-17 MED ORDER — RABEPRAZOLE SODIUM 20 MG PO TBEC: DELAYED_RELEASE_TABLET | ORAL | 1 refills | Status: DC

## 2021-09-17 NOTE — Telephone Encounter (Signed)
[facility] requested refill.  ?

## 2021-09-25 ENCOUNTER — Other Ambulatory Visit: Payer: Self-pay | Admitting: Family

## 2021-09-25 DIAGNOSIS — F331 Major depressive disorder, recurrent, moderate: Secondary | ICD-10-CM

## 2021-09-30 ENCOUNTER — Telehealth: Payer: Self-pay | Admitting: Cardiology

## 2021-09-30 NOTE — Telephone Encounter (Signed)
Patient called stating she has follow-up questions for Tommye Standard regarding her last OV.

## 2021-09-30 NOTE — Telephone Encounter (Signed)
Patient is calling stating that she feels like "amiodarone is killing me." She states that she had a bad fall in the last year and hasn't been the same since. She believes that Amiodarone is the culprit to her hair falling out and states she feels like its keeping her weak. She is wanting to know if there's any other options. I discussed potentially tikosyn, but didn't know if she would be a good candidate. She states she has mentioned this to Dr [name], but that "he seems like he's going to keep me on it no matter what I want." Will route to [facility] for review.

## 2021-10-01 NOTE — Telephone Encounter (Signed)
Called and spoke with patient. She has now been scheduled to see Dr [name] on 8/25 (first available) to discuss discontinuing the Amiodarone.

## 2021-10-06 ENCOUNTER — Ambulatory Visit (INDEPENDENT_AMBULATORY_CARE_PROVIDER_SITE_OTHER): Payer: Medicare Other | Admitting: Family Medicine

## 2021-10-06 ENCOUNTER — Encounter: Payer: Self-pay | Admitting: Family Medicine

## 2021-10-06 VITALS — BP 128/76 | HR 55 | Temp 96.9°F | Ht 68.5 in | Wt 122.2 lb

## 2021-10-06 DIAGNOSIS — F331 Major depressive disorder, recurrent, moderate: Secondary | ICD-10-CM | POA: Diagnosis not present

## 2021-10-06 DIAGNOSIS — I5043 Acute on chronic combined systolic (congestive) and diastolic (congestive) heart failure: Secondary | ICD-10-CM | POA: Diagnosis not present

## 2021-10-06 DIAGNOSIS — E43 Unspecified severe protein-calorie malnutrition: Secondary | ICD-10-CM

## 2021-10-06 DIAGNOSIS — E440 Moderate protein-calorie malnutrition: Secondary | ICD-10-CM | POA: Diagnosis not present

## 2021-10-06 DIAGNOSIS — I10 Essential (primary) hypertension: Secondary | ICD-10-CM

## 2021-10-06 DIAGNOSIS — I4819 Other persistent atrial fibrillation: Secondary | ICD-10-CM

## 2021-10-06 DIAGNOSIS — F411 Generalized anxiety disorder: Secondary | ICD-10-CM | POA: Diagnosis not present

## 2021-10-06 DIAGNOSIS — R636 Underweight: Secondary | ICD-10-CM | POA: Diagnosis not present

## 2021-10-06 DIAGNOSIS — R350 Frequency of micturition: Secondary | ICD-10-CM

## 2021-10-06 LAB — POCT URINALYSIS DIPSTICK
Bilirubin, UA: NEGATIVE
Glucose, UA: NEGATIVE
Ketones, UA: NEGATIVE
Nitrite, UA: POSITIVE
Protein, UA: POSITIVE — AB
Spec Grav, UA: 1.015 (ref 1.010–1.025)
Urobilinogen, UA: 0.2 E.U./dL
pH, UA: 5 (ref 5.0–8.0)

## 2021-10-06 NOTE — Progress Notes (Signed)
Provider:  Alain Honey, MD  Careteam: Patient Care Team: Wardell Honour, MD as PCP - General (Family Medicine) [name], Peter M, MD as PCP - Cardiology (Cardiology) [name], Peter M, MD as Consulting Physician (Cardiology) Milus Banister, MD as Attending Physician (Gastroenterology)  PLACE OF SERVICE:  [place]  Advanced Directive information    Allergies  Allergen Reactions   Hydrocodone Itching   Cephalexin Itching and Swelling    Other reaction(s): edema   Ciprofloxacin Other (See Comments)    Not indicated due to aneurysm in aorta     Doxycycline Other (See Comments)    Unknown reaction   Escitalopram Other (See Comments)    Dry mouth   Levofloxacin Other (See Comments)    Not indicated due to aneurysm in aorta    Moxifloxacin Other (See Comments)    Not indicated due to aneurysm in aorta    Ofloxacin Other (See Comments)    Unknown reaction   Sertraline Other (See Comments)    insomnia Other reaction(s): insomnia   Sulfamethoxazole Hives    Any sulfa meds.   Tape Other (See Comments)    Burns skin.    Latex Rash    "If pt. Makes contact with or wearing"   Neomycin Rash    Chief Complaint  Patient presents with   Medical Management of Chronic Issues    Patient presents today for a 1 month follow-up. She was advised to restart Wellbutrin 150 mg daily.     HPI: Patient is a 88 y.o. female .  Patient is here to follow-up gait instability, status post fractured pelvis and just generally feeling poorly.  She had a pelvis fracture and underwent rehab at [facility].  She is back in her condo now.  She has had at least 1 further fall yet she does not use any aids such as a walker or cane although this has been suggested to her in the past.  She is convinced that she feels poorly because she is taking amiodarone.  I had asked her to previously discussed with her cardiology the necessity of this although I doubt that the amiodarone is the cause for her  feeling poorly.  I did I suspect it is more a function of debility and age.  Review of Systems:  Review of Systems  Constitutional:  Positive for malaise/fatigue.  Eyes: Negative.   Respiratory: Negative.    Cardiovascular: Negative.   Genitourinary:  Positive for frequency.  Musculoskeletal:  Positive for falls and joint pain.  Neurological: Negative.   Psychiatric/Behavioral:  Positive for depression.   All other systems reviewed and are negative.   Past Medical History:  Diagnosis Date   AAA (abdominal aortic aneurysm) (HCC)    2.8 cm 05/2018; 5 year [place] per ACR guidelines   Allergy    all year    Anxiety disorder    Arthritis    Barrett's esophagus 04/2008   Benign neoplasm of colon    Blood transfusion without reported diagnosis    long ago per pt   Cataract    removed both eyes   CHF (congestive heart failure) ([place])    Chronic airway obstruction, not elsewhere classified    Complication of anesthesia    slow to wake up   Depressive disorder, not elsewhere classified    Diverticulitis    Dysthymic disorder    Erosive gastritis 08/2004   GERD (gastroesophageal reflux disease)    Hair thinning    HTN (hypertension)  pt denies    Hyperplastic polyps of stomach 06/2002   Hypothyroidism    ICD (implantable cardiac defibrillator) in place    BiV/ICD; s/p removal of ICD with insertion of BiV PPM 04/26/12   Incisional hernia    Insomnia 08/20/2015   Kyphosis 08/20/2015   LBBB (left bundle branch block)    Melanoma (HCC)    right arm   Mitral regurgitation    Nonischemic cardiomyopathy ([place])    EF initially 20%; Last measurement up to 50% per echo in August of 2012   Osteopenia    Other and unspecified hyperlipidemia    Other and unspecified hyperlipidemia    Pacemaker 04/26/2012   PAF (paroxysmal atrial fibrillation) ([place])    11/2016   Pain in joint, lower leg    Palpitations    Pneumonia    Restless legs syndrome (RLS)    Synovial cyst of popliteal space     Unspecified chronic bronchitis (HCC)    Unspecified nasal polyp    Unspecified sinusitis (chronic)    Past Surgical History:  Procedure Laterality Date   ABDOMINAL HYSTERECTOMY  1076   endometriosis   APPENDECTOMY     BI-VENTRICULAR PACEMAKER INSERTION N/A 04/26/2012   Procedure: BI-VENTRICULAR PACEMAKER INSERTION (CRT-P);  Surgeon: Evans Lance, MD;  Location: [facility] CATH LAB;  Service: Cardiovascular;  Laterality: N/A;   BIV PACEMAKER GENERATOR CHANGEOUT N/A 01/26/2019   Procedure: BIV PACEMAKER GENERATOR CHANGEOUT;  Surgeon: Evans Lance, MD;  Location: [place] CV LAB;  Service: Cardiovascular;  Laterality: N/A;   CARDIOVERSION N/A 05/06/2020   Procedure: CARDIOVERSION;  Surgeon: Pixie Casino, MD;  Location: [place];  Service: Cardiovascular;  Laterality: N/A;   CARDIOVERSION N/A 05/19/2020   Procedure: CARDIOVERSION;  Surgeon: Jerline Pain, MD;  Location: [facility] ENDOSCOPY;  Service: Cardiovascular;  Laterality: N/A;   COLONOSCOPY     defibrillator insertion     s/p removal of previously implanted BiV ICD and insertion of a new BiV pacemaker on 04/26/12   excise mole of lip  2001   Dr. Larena Sox   excision of wen  1984   Dr. Parks Ranger   FEMORAL HERNIA REPAIR  12/25/2012   Dr Dalbert Batman   FEMORAL HERNIA REPAIR  01/04/2013   Recurrent - Dr. Nash Mantis HERNIA REPAIR N/A 10/01/2013   Procedure: LAPAROSCOPIC INCISIONAL HERNIA;  Surgeon: Gayland Curry, MD;  Location: [facility];  Service: General;  Laterality: N/A;   INCISIONAL HERNIA REPAIR N/A 09/07/2018   Procedure: LAPAROSCOPIC ASSISTED REPAIR OF INCISIONAL HERNIA;  Surgeon: Greer Pickerel, MD;  Location: [place];  Service: General;  Laterality: N/A;   INGUINAL HERNIA REPAIR Right 09/26/2012   Procedure: RIGHT INGUINAL HERNIA REPAIR WITH MESH;  Surgeon: Odis Hollingshead, MD;  Location: [place];  Service: General;  Laterality: Right;   INGUINAL HERNIA REPAIR Right 12/25/2012   Procedure: explor right groin, small bowel rescection,  tissue repair right femoral hernia;  Surgeon: Adin Hector, MD;  Location: [facility];  Service: General;  Laterality: Right;   INSERTION OF MESH Right 09/26/2012   Procedure: INSERTION OF MESH;  Surgeon: Odis Hollingshead, MD;  Location: [place];  Service: General;  Laterality: Right;   INSERTION OF MESH N/A 10/01/2013   Procedure: INSERTION OF MESH;  Surgeon: Gayland Curry, MD;  Location: [facility];  Service: General;  Laterality: N/A;   LAPAROSCOPY N/A 01/04/2013   Procedure: Diagnostic Laparoscopy, exploratory laparotomy with small bowel resection, closure of right femoral hernia repair;  Surgeon: Madilyn Hook, DO;  Location: [facility];  Service: General;  Laterality: N/A;   Left arm surgery     POLYPECTOMY     RIGHT BREAST LUMPECTOMY  1988   Dr. Marylene Buerger   right knee surgery     arthroscopy: Dr. Shellia Carwin   TONSILLECTOMY     UPPER GASTROINTESTINAL ENDOSCOPY     Social History:   reports that she quit smoking about 27 years ago. Her smoking use included cigarettes. She has a 15.00 pack-year smoking history. She has never used smokeless tobacco. She reports that she does not drink alcohol and does not use drugs.  Family History  Problem Relation Age of Onset   Stroke Father    Heart disease Other        maternal side   Colon cancer Paternal Uncle    Breast cancer Maternal Aunt    Colon cancer Cousin    Diabetes Neg Hx    Colon polyps Neg Hx    Rectal cancer Neg Hx    Stomach cancer Neg Hx     Medications: Patient's Medications  New Prescriptions   No medications on file  Previous Medications   ACETAMINOPHEN (TYLENOL) 500 MG TABLET    Take 1,000 mg by mouth every 8 (eight) hours as needed for moderate pain or mild pain.   AMIODARONE (PACERONE) 200 MG TABLET    Take 0.5 tablets (100 mg total) by mouth daily.   ASCORBIC ACID (VITAMIN C) 1000 MG TABLET    Take 1,000 mg by mouth at bedtime. Ester C   BIOTIN 5000 MCG TABS    Take 5,000 mcg by mouth daily.   BUPROPION (WELLBUTRIN SR)  150 MG 12 HR TABLET    Take 1 tablet (150 mg total) by mouth daily.   CALCIUM CARBONATE-VITAMIN D (CALCIUM + D PO)    Take 2 tablets by mouth daily. 1000 units Vitamin D 1200 mg calcium   CYANOCOBALAMIN (VITAMIN B 12) 500 MCG TABS    Take 1,000 mcg by mouth daily.    ESTRADIOL (ESTRACE) 0.1 MG/GM VAGINAL CREAM    Place 1 g vaginally 2 (two) times a week.   FEXOFENADINE (ALLEGRA) 180 MG TABLET    Take 180 mg by mouth daily.   FUROSEMIDE (LASIX) 20 MG TABLET    Take 1 tablet (20 mg total) by mouth daily.   LEVOTHYROXINE (SYNTHROID) 100 MCG TABLET    Take 1 tablet (100 mcg total) by mouth daily before breakfast.   LOSARTAN (COZAAR) 25 MG TABLET    TAKE 1 TABLET BY MOUTH EVERY DAY   METOPROLOL SUCCINATE (TOPROL XL) 25 MG 24 HR TABLET    Take 0.5 tablets (12.5 mg total) by mouth daily.   NITROFURANTOIN (MACRODANTIN) 100 MG CAPSULE    Take 1 capsule (100 mg total) by mouth 2 (two) times daily. For 7 days.   POLYETHYL GLYCOL-PROPYL GLYCOL (SYSTANE) 0.4-0.3 % SOLN    Place 1 drop into both eyes daily as needed (dry eyes).   RABEPRAZOLE (ACIPHEX) 20 MG TABLET    Take one tablet by mouth once daily for stomach   SACCHAROMYCES BOULARDII (FLORASTOR) 250 MG CAPSULE    Take 1 capsule (250 mg total) by mouth 2 (two) times daily. For 10 days.   VITAMIN D, ERGOCALCIFEROL, (DRISDOL) 1.25 MG (50000 UNIT) CAPS CAPSULE    Take 1 capsule (50,000 Units total) by mouth every 7 (seven) days.   XARELTO 15 MG TABS TABLET    TAKE 1 TABLET DAILY  WITH   SUPPER (DOSE REDUCED)  Modified Medications   No medications on file  Discontinued Medications   No medications on file    Physical Exam:  Vitals:   10/06/21 1337  BP: 128/76  Pulse: (!) 55  Temp: (!) 96.9 F (36.1 C)  SpO2: 98%  Weight: 122 lb 3.2 oz (55.4 kg)  Height: 5' 8.5" (1.74 m)   Body mass index is 18.31 kg/m. Wt Readings from Last 3 Encounters:  10/06/21 122 lb 3.2 oz (55.4 kg)  09/02/21 122 lb 6.4 oz (55.5 kg)  08/26/21 125 lb (56.7 kg)     Physical Exam Vitals and nursing note reviewed.  Constitutional:      Appearance: She is ill-appearing.  Cardiovascular:     Rate and Rhythm: Normal rate and regular rhythm.  Pulmonary:     Effort: Pulmonary effort is normal.     Breath sounds: Normal breath sounds.  Musculoskeletal:     Comments: Gait is unstable.  When she turns around walking in the hall a fall almost occurred  Neurological:     General: No focal deficit present.     Mental Status: She is alert and oriented to person, place, and time.     Labs reviewed: Basic Metabolic Panel: Recent Labs    01/01/21 1429 04/07/21 1548 04/08/21 0414 08/26/21 1449  NA 138 143 142 141  K 4.9 4.1 3.5 4.3  CL 101 110 112* 107  CO2 21 24 21* 25  GLUCOSE 81 116* 113* 97  BUN 31* 21 20 27*  CREATININE 1.25* 1.10* 0.99 1.18*  CALCIUM 9.4 9.3 9.0 9.1  MG  --   --  2.0  --   TSH 4.330  --   --  1.44   Liver Function Tests: Recent Labs    01/01/21 1429 04/07/21 1548 04/08/21 0414 08/26/21 1449  AST 21 53* 58* 12  ALT '12 24 25 '$ 5*  ALKPHOS 86 67 58  --   BILITOT 0.4 1.3* 1.1 0.4  PROT 6.5 5.9* 5.3* 6.3  ALBUMIN 4.6 3.5 3.1*  --    No results for input(s): "LIPASE", "AMYLASE" in the last 8760 hours. No results for input(s): "AMMONIA" in the last 8760 hours. CBC: Recent Labs    04/07/21 1548 04/08/21 0414 04/10/21 0544 08/26/21 1449  WBC 9.7 9.5 9.9 6.0  NEUTROABS 7.9* 7.5  --  4,128  HGB 11.4* 10.9* 10.8* 11.2*  HCT 34.7* 32.3* 32.5* 33.2*  MCV 94.0 94.2 92.9 92.2  PLT 157 146* 163 211   Lipid Panel: No results for input(s): "CHOL", "HDL", "LDLCALC", "TRIG", "CHOLHDL", "LDLDIRECT" in the last 8760 hours. TSH: Recent Labs    01/01/21 1429 08/26/21 1449  TSH 4.330 1.44   A1C: No results found for: "HGBA1C"   Assessment/Plan  1. Moderate malnutrition ([place]) Patient is very thin and frail.  Most recent protein level and CMP was 5.3    3. Primary hypertension Blood pressure is good at  128/76  4. Underweight Encouraged regular meals and should consider protein Ensure or boost milkshakes   5. Urine frequency Urine is positive for nitrite today we will do a culture  6. Acute on chronic combined systolic and diastolic congestive heart failure ([place]) Appears compensated at this time normal blood pressure no edema lungs are clear  7. Generalized anxiety disorder She has started back on Wellbutrin hopefully this will offer some relief  8. Moderate episode of recurrent major depressive disorder (HCC) Continue Wellbutrin as above  9. Persistent  atrial fibrillation ([place]) Followed by cardiology.  Medications include Xarelto and amiodarone   Alain Honey, MD [place] [phone]

## 2021-10-07 ENCOUNTER — Other Ambulatory Visit: Payer: Self-pay

## 2021-10-07 LAB — BASIC METABOLIC PANEL WITH GFR
BUN/Creatinine Ratio: 25 (calc) — ABNORMAL HIGH (ref 6–22)
BUN: 27 mg/dL — ABNORMAL HIGH (ref 7–25)
CO2: 24 mmol/L (ref 20–32)
Calcium: 9 mg/dL (ref 8.6–10.4)
Chloride: 106 mmol/L (ref 98–110)
Creat: 1.08 mg/dL — ABNORMAL HIGH (ref 0.60–0.95)
Glucose, Bld: 81 mg/dL (ref 65–99)
Potassium: 4.4 mmol/L (ref 3.5–5.3)
Sodium: 138 mmol/L (ref 135–146)
eGFR: 50 mL/min/{1.73_m2} — ABNORMAL LOW (ref >=60)

## 2021-10-07 LAB — VITAMIN D 25 HYDROXY (VIT D DEFICIENCY, FRACTURES): Vit D, 25-Hydroxy: 46 ng/mL (ref 30–100)

## 2021-10-07 MED ORDER — VITAMIN D3 25 MCG (1000 UT) PO CAPS: 1000.0000 [IU] | ORAL_CAPSULE | Freq: Every day | ORAL | 11 refills | Status: DC

## 2021-10-09 LAB — URINE CULTURE
MICRO NUMBER:: 13721375
SPECIMEN QUALITY:: ADEQUATE

## 2021-10-12 ENCOUNTER — Telehealth: Payer: Self-pay | Admitting: *Deleted

## 2021-10-12 NOTE — Telephone Encounter (Signed)
Patient called and wanted to know the results of the Urine Culture she had done on 10/06/21. Stated that she is still having Frequency and burning.   Patient is requesting an antibiotic. Stated that she was told she would have to wait until the culture came back.   Please Advise. (Pended and sent to [facility] due to Dr. Sabra Heck out of office.)

## 2021-10-13 ENCOUNTER — Other Ambulatory Visit: Payer: Self-pay | Admitting: Family Medicine

## 2021-10-13 DIAGNOSIS — N3000 Acute cystitis without hematuria: Secondary | ICD-10-CM

## 2021-10-13 MED ORDER — AMOXICILLIN 500 MG PO CAPS: 500.0000 mg | ORAL_CAPSULE | Freq: Three times a day (TID) | ORAL | 0 refills | Status: AC

## 2021-10-13 NOTE — Telephone Encounter (Signed)
[place], CMA  10/13/2021 11:24 AM EDT Back to Top    Dr. Sabra Heck message: Would treat with Amox 250 mg tid x 7 days, on medication list it states 500 mg tid for 10 days. Verified with Dr.Miller which one and he reports the rx he send into pharmacy.    Patient was advised and verbalized understanding.

## 2021-10-15 ENCOUNTER — Ambulatory Visit: Payer: Medicare Other | Admitting: Nurse Practitioner

## 2021-10-26 NOTE — Progress Notes (Signed)
[name] Date of Birth: 1933/02/09 Medical Record #[id number]  History of Present Illness: [name] is seen today for follow up. She has multiple medical issues which include a cardiomyopathy, underlying BiV/ICD which resulted in improvement of her EF, LBBB, HTN, thyroid disease, melanoma, anxiety/depression and chronic systolic HF.   In September 2018 she was noted in device clinic to have paroxysmal Afib with controlled rate. She was started on Eliquis. [place] Vasc score of at least 4. She developed itching on Eliquis and switched to Xarelto. Itching did not go away so she was switched to Coumadin. After about a month the itching resolved.  She was later switched back to Xarelto without rash. She had change out of her ICD generator in November due to ERI. Her device was downgraded to Biventricular pacing only. Follow up with Dr Lovena Le  In February showed normal device function.  She was seen in the office on 04/28/20 with complaints of increased SOB, edema and fatigue. Was found to be in Afib with rate 125. Patient admitted from the office with acute on chronic CHF felt to be secondary to atrial fibrillation with RVR. BNP elevated at 1,299. Chest x-ray consistent with CHF with mild pulmonary edema and small bilateral pleural effusions. Echo showed LVEF of 25-30% (down from 55-60% in 06/2019) with global hypokinesis and moderate to severe MR. RV mildly enlarged with mildly reduced systolic function, severe TR, and moderately elevated PASP. Also showed small pericardial effusion. Patient was diuresed with IV Lasix. Net negative 8.078 L during admission. Discharge weight 117lb but this may not be accurate (was 130.5lbs on 05/19/2020). Was discharged on Lasix '40mg'$  daily, Losartan '25mg'$  daily (rather than home Trandolapril), and Toprol-XL '25mg'$  daily (rather than home Lopressor). Did not add spironolactone  given soft BP at times. Patient also does not want to be on a lot of new medications. She was  considered for Tikosyn but did not want to start this. She was started on amiodarone.  Drop in EF felt to be secondary to atrial fibrillation with RVR. Given advance age, it was recommended to treat atrial fibrillation and heart failure medically and then reassess EF in 3 months. No ischemic evaluation felt to be necessary this admission.     On her last visit we added aldactone 25 mg daily. This resulted in  increase in creatinine and potassium aldactone was discontinued.   Repeat Echo in August showed improvement in EF to 45-50%. Last device check in August showed no recurrent Afib. She never did start Jardiance and in fact she does have a history of frequent UTIs.   She was hospitalized 04/07/21 - 04/10/21 after a mechanical fall suffering fractures of the right superior and left inferior pubic rami with moderate displacement, Orthopedics consulted: WBAT progress gradually with walker and 1-2 person assist.  No noted acute cardiac issues.  On follow up she still complains of fatigue and imbalance. Complains of hair falling out. Has instability with gait and imbalance. On visit with EP in June her Toprol was reduced to 12.5 mg daily. Still very concerned about effects of amiodarone on her system.   Current Outpatient Medications  Medication Sig Dispense Refill   acetaminophen (TYLENOL) 500 MG tablet Take 1,000 mg by mouth every 8 (eight) hours as needed for moderate pain or mild pain.     Ascorbic Acid (VITAMIN C) 1000 MG tablet Take 1,000 mg by mouth at bedtime. Ester C     Biotin 5000 MCG TABS Take 5,000 mcg by  mouth daily.     buPROPion (WELLBUTRIN SR) 150 MG 12 hr tablet Take 1 tablet (150 mg total) by mouth daily. 30 tablet 3   Calcium Carbonate-Vitamin D (CALCIUM + D PO) Take 2 tablets by mouth daily. 1000 units Vitamin D 1200 mg calcium     Cholecalciferol (VITAMIN D3) 25 MCG (1000 UT) CAPS Take 1 capsule (1,000 Units total) by mouth daily. 60 capsule 11   Cyanocobalamin (VITAMIN B 12) 500  MCG TABS Take 1,000 mcg by mouth daily.      estradiol (ESTRACE) 0.1 MG/GM vaginal cream Place 1 g vaginally 2 (two) times a week.     fexofenadine (ALLEGRA) 180 MG tablet Take 180 mg by mouth daily.     furosemide (LASIX) 20 MG tablet Take 1 tablet (20 mg total) by mouth daily. 30 tablet    levothyroxine (SYNTHROID) 100 MCG tablet Take 1 tablet (100 mcg total) by mouth daily before breakfast. 90 tablet 1   losartan (COZAAR) 25 MG tablet TAKE 1 TABLET BY MOUTH EVERY DAY 90 tablet 3   metoprolol succinate (TOPROL XL) 25 MG 24 hr tablet Take 0.5 tablets (12.5 mg total) by mouth daily. 45 tablet 3   Polyethyl Glycol-Propyl Glycol (SYSTANE) 0.4-0.3 % SOLN Place 1 drop into both eyes daily as needed (dry eyes).     RABEprazole (ACIPHEX) 20 MG tablet Take one tablet by mouth once daily for stomach 90 tablet 1   saccharomyces boulardii (FLORASTOR) 250 MG capsule Take 1 capsule (250 mg total) by mouth 2 (two) times daily. For 10 days. 20 capsule 0   Vitamin D, Ergocalciferol, (DRISDOL) 1.25 MG (50000 UNIT) CAPS capsule Take 1 capsule (50,000 Units total) by mouth every 7 (seven) days. 5 capsule    XARELTO 15 MG TABS tablet TAKE 1 TABLET DAILY WITH   SUPPER (DOSE REDUCED) 90 tablet 3   No current facility-administered medications for this visit.    Allergies  Allergen Reactions   Hydrocodone Itching   Cephalexin Itching and Swelling    Other reaction(s): edema   Ciprofloxacin Other (See Comments)    Not indicated due to aneurysm in aorta     Doxycycline Other (See Comments)    Unknown reaction   Escitalopram Other (See Comments)    Dry mouth   Levofloxacin Other (See Comments)    Not indicated due to aneurysm in aorta    Moxifloxacin Other (See Comments)    Not indicated due to aneurysm in aorta    Ofloxacin Other (See Comments)    Unknown reaction   Sertraline Other (See Comments)    insomnia Other reaction(s): insomnia   Sulfamethoxazole Hives    Any sulfa meds.   Tape Other (See  Comments)    Burns skin.    Latex Rash    "If pt. Makes contact with or wearing"   Neomycin Rash    Past Medical History:  Diagnosis Date   AAA (abdominal aortic aneurysm) (HCC)    2.8 cm 05/2018; 5 year [place] per ACR guidelines   Allergy    all year    Anxiety disorder    Arthritis    Barrett's esophagus 04/2008   Benign neoplasm of colon    Blood transfusion without reported diagnosis    long ago per pt   Cataract    removed both eyes   CHF (congestive heart failure) ([place])    Chronic airway obstruction, not elsewhere classified    Complication of anesthesia    slow to wake up  Depressive disorder, not elsewhere classified    Diverticulitis    Dysthymic disorder    Erosive gastritis 08/2004   GERD (gastroesophageal reflux disease)    Hair thinning    HTN (hypertension)    pt denies    Hyperplastic polyps of stomach 06/2002   Hypothyroidism    ICD (implantable cardiac defibrillator) in place    BiV/ICD; s/p removal of ICD with insertion of BiV PPM 04/26/12   Incisional hernia    Insomnia 08/20/2015   Kyphosis 08/20/2015   LBBB (left bundle branch block)    Melanoma (HCC)    right arm   Mitral regurgitation    Nonischemic cardiomyopathy ([place])    EF initially 20%; Last measurement up to 50% per echo in August of 2012   Osteopenia    Other and unspecified hyperlipidemia    Other and unspecified hyperlipidemia    Pacemaker 04/26/2012   PAF (paroxysmal atrial fibrillation) ([place])    11/2016   Pain in joint, lower leg    Palpitations    Pneumonia    Restless legs syndrome (RLS)    Synovial cyst of popliteal space    Unspecified chronic bronchitis (HCC)    Unspecified nasal polyp    Unspecified sinusitis (chronic)     Past Surgical History:  Procedure Laterality Date   ABDOMINAL HYSTERECTOMY  1076   endometriosis   APPENDECTOMY     BI-VENTRICULAR PACEMAKER INSERTION N/A 04/26/2012   Procedure: BI-VENTRICULAR PACEMAKER INSERTION (CRT-P);  Surgeon: Evans Lance, MD;   Location: [facility] CATH LAB;  Service: Cardiovascular;  Laterality: N/A;   BIV PACEMAKER GENERATOR CHANGEOUT N/A 01/26/2019   Procedure: BIV PACEMAKER GENERATOR CHANGEOUT;  Surgeon: Evans Lance, MD;  Location: [place] CV LAB;  Service: Cardiovascular;  Laterality: N/A;   CARDIOVERSION N/A 05/06/2020   Procedure: CARDIOVERSION;  Surgeon: Pixie Casino, MD;  Location: [place];  Service: Cardiovascular;  Laterality: N/A;   CARDIOVERSION N/A 05/19/2020   Procedure: CARDIOVERSION;  Surgeon: Jerline Pain, MD;  Location: [facility] ENDOSCOPY;  Service: Cardiovascular;  Laterality: N/A;   COLONOSCOPY     defibrillator insertion     s/p removal of previously implanted BiV ICD and insertion of a new BiV pacemaker on 04/26/12   excise mole of lip  2001   Dr. Larena Sox   excision of wen  1984   Dr. Parks Ranger   FEMORAL HERNIA REPAIR  12/25/2012   Dr Dalbert Batman   FEMORAL HERNIA REPAIR  01/04/2013   Recurrent - Dr. Nash Mantis HERNIA REPAIR N/A 10/01/2013   Procedure: LAPAROSCOPIC INCISIONAL HERNIA;  Surgeon: Gayland Curry, MD;  Location: [facility];  Service: General;  Laterality: N/A;   INCISIONAL HERNIA REPAIR N/A 09/07/2018   Procedure: LAPAROSCOPIC ASSISTED REPAIR OF INCISIONAL HERNIA;  Surgeon: Greer Pickerel, MD;  Location: [place];  Service: General;  Laterality: N/A;   INGUINAL HERNIA REPAIR Right 09/26/2012   Procedure: RIGHT INGUINAL HERNIA REPAIR WITH MESH;  Surgeon: Odis Hollingshead, MD;  Location: [place];  Service: General;  Laterality: Right;   INGUINAL HERNIA REPAIR Right 12/25/2012   Procedure: explor right groin, small bowel rescection, tissue repair right femoral hernia;  Surgeon: Adin Hector, MD;  Location: [facility];  Service: General;  Laterality: Right;   INSERTION OF MESH Right 09/26/2012   Procedure: INSERTION OF MESH;  Surgeon: Odis Hollingshead, MD;  Location: [place];  Service: General;  Laterality: Right;   INSERTION OF MESH N/A 10/01/2013   Procedure: INSERTION OF  MESH;  Surgeon:  Gayland Curry, MD;  Location: [facility];  Service: General;  Laterality: N/A;   LAPAROSCOPY N/A 01/04/2013   Procedure: Diagnostic Laparoscopy, exploratory laparotomy with small bowel resection, closure of right femoral hernia repair;  Surgeon: Madilyn Hook, DO;  Location: [facility];  Service: General;  Laterality: N/A;   Left arm surgery     POLYPECTOMY     RIGHT BREAST LUMPECTOMY  1988   Dr. Marylene Buerger   right knee surgery     arthroscopy: Dr. Shellia Carwin   TONSILLECTOMY     UPPER GASTROINTESTINAL ENDOSCOPY      Social History   Tobacco Use  Smoking Status Former   Packs/day: 1.00   Years: 15.00   Total pack years: 15.00   Types: Cigarettes   Quit date: 12/22/1993   Years since quitting: 27.8  Smokeless Tobacco Never  Tobacco Comments   Does'nt reall year quit     Social History   Substance and Sexual Activity  Alcohol Use No   Alcohol/week: 0.0 standard drinks of alcohol    Family History  Problem Relation Age of Onset   Stroke Father    Heart disease Other        maternal side   Colon cancer Paternal Uncle    Breast cancer Maternal Aunt    Colon cancer Cousin    Diabetes Neg Hx    Colon polyps Neg Hx    Rectal cancer Neg Hx    Stomach cancer Neg Hx     Review of Systems: The review of systems is per the HPI.   All other systems were reviewed and are negative.  Physical Exam: BP 127/67   Pulse (!) 55   Ht '5\' 8"'$  (1.727 m)   Wt 121 lb 12.8 oz (55.2 kg)   SpO2 98%   BMI 18.52 kg/m  GENERAL:  thin, elderly thin WF in NAD HEENT:  PERRL, EOMI, sclera are clear. Oropharynx is clear. NECK:  No JVD, carotid upstroke brisk and symmetric, no bruits, no thyromegaly or adenopathy LUNGS:  Clear to auscultation bilaterally CHEST:  Unremarkable HEART:  RRR,  PMI not displaced or sustained,S1 and S2 within normal limits, no S3, no S4: no clicks, no rubs, no murmurs ABD:  Soft, nontender. BS +, no masses or bruits. No hepatomegaly, no splenomegaly EXT:  2 + pulses  throughout,  no edema, varicosities.  SKIN:  Warm and dry.  No rashes NEURO:  Alert and oriented x 3. Cranial nerves II through XII intact. PSYCH:  Cognitively intact   Wt Readings from Last 3 Encounters:  10/30/21 121 lb 12.8 oz (55.2 kg)  10/06/21 122 lb 3.2 oz (55.4 kg)  09/02/21 122 lb 6.4 oz (55.5 kg)     LABORATORY DATA: Lab Results  Component Value Date   WBC 6.0 08/26/2021   HGB 11.2 (L) 08/26/2021   HCT 33.2 (L) 08/26/2021   PLT 211 08/26/2021   GLUCOSE 81 10/06/2021   CHOL 159 11/22/2019   TRIG 137 11/22/2019   HDL 53 11/22/2019   LDLCALC 82 11/22/2019   ALT 5 (L) 08/26/2021   AST 12 08/26/2021   NA 138 10/06/2021   K 4.4 10/06/2021   CL 106 10/06/2021   CREATININE 1.08 (H) 10/06/2021   BUN 27 (H) 10/06/2021   CO2 24 10/06/2021   TSH 1.44 08/26/2021   INR 1.7 (H) 05/18/2020     Echo: 07/22/16: Study Conclusions   - Left ventricle: The cavity size was mildly dilated.  Wall   thickness was normal. Systolic function was normal. The estimated   ejection fraction was in the range of 50% to 55%. Wall motion was   normal; there were no regional wall motion abnormalities. - Aortic valve: There was trivial regurgitation. - Mitral valve: There was mild regurgitation. - Atrial septum: No defect or patent foramen ovale was identified.   Echo 06/29/19: IMPRESSIONS     1. Left ventricular ejection fraction, by estimation, is 55 to 60%. The  left ventricle has normal function. The left ventricle has no regional  wall motion abnormalities. Left ventricular diastolic parameters are  consistent with Grade II diastolic  dysfunction (pseudonormalization). Elevated left atrial pressure.   2. Right ventricular systolic function is normal. The right ventricular  size is normal. There is moderately elevated pulmonary artery systolic  pressure.   3. Left atrial size was moderately dilated.   4. The mitral valve is normal in structure. Mild to moderate mitral valve   regurgitation.   5. Tricuspid valve regurgitation is mild to moderate.   6. The aortic valve is tricuspid. Aortic valve regurgitation is mild. No  aortic stenosis is present.   7. Aortic dilatation noted. There is borderline dilatation of the  ascending aorta measuring 37 mm.   8. The inferior vena cava is dilated in size with >50% respiratory  variability, suggesting right atrial pressure of 8 mmHg.    Echocardiogram 05/17/2020: Impressions:  1. Left ventricular ejection fraction, by estimation, is 25 to 30%. The  left ventricle has severely decreased function. The left ventricle  demonstrates global hypokinesis. Left ventricular diastolic function could  not be evaluated.   2. Right ventricular systolic function is mildly reduced. The right  ventricular size is mildly enlarged. There is moderately elevated  pulmonary artery systolic pressure. The estimated right ventricular  systolic pressure is 74.2 mmHg.   3. Left atrial size was severely dilated.   4. Right atrial size was mildly dilated.   5. A small pericardial effusion is present. The pericardial effusion is  circumferential.   6. The mitral valve is grossly normal. Moderate to severe mitral valve  regurgitation. No evidence of mitral stenosis.   7. Tricuspid valve regurgitation is severe.   8. The aortic valve is tricuspid. Aortic valve regurgitation is not  visualized. No aortic stenosis is present.   9. The inferior vena cava is dilated in size with <50% respiratory  variability, suggesting right atrial pressure of 15 mmHg.   Comparison(s): Changes from prior study are noted. EF now severely  reduced. Moderately dilated RV. Moderate to severe MR. Severe TR. RVSP ~49  mmHG.   Echo 10/09/20: IMPRESSIONS     1. Significant improvement in EF since echo done 05/17/20. Left  ventricular ejection fraction, by estimation, is 45 to 50%. The left  ventricle has mildly decreased function. The left ventricle demonstrates   global hypokinesis. The left ventricular  internal cavity size was mildly dilated. Left ventricular diastolic  parameters were normal.   2. ICD wires in RA/RV. Right ventricular systolic function is normal. The  right ventricular size is normal. There is mildly elevated pulmonary  artery systolic pressure.   3. Left atrial size was moderately dilated.   4. The pericardial effusion is posterior to the left ventricle.   5. ? MR from restricted posterior leaflet motion . The mitral valve is  abnormal. Moderate mitral valve regurgitation. No evidence of mitral  stenosis.   6. Tricuspid valve regurgitation is moderate.   7.  The aortic valve is tricuspid. Aortic valve regurgitation is trivial.  No aortic stenosis is present.   8. The inferior vena cava is normal in size with greater than 50%  respiratory variability, suggesting right atrial pressure of 3 mmHg.   Assessment / Plan: 1.  History of dialted Cardiomyopathy with chronic systolic CHF.  EF  normal in April 2021. Has BiV pacemaker in place. Significant worsening of CHF in Feb. EF down to 25-30% in setting of Afib with RVR. Clinically improved.  Weight is stable  no edema. Maintaining NSR on exam and by pacer check.  Repeat Echo in August showed improved EF to 45-50%. Will continue Lasix, losartan and Toprol. Not a candidate for SGLT 2 inhibitor due to history of UTIs.   2. NSVT. Follow up ICD/biV in device clinic. S/p generator change out in November 2020. Normal device function.   3. Paroxysmal Afib. persistent and ERAD post cardioversion on May 06, 2020. RVR. Repeat DCCV post amiodarone in March 2022. Now in NSR. No recurrent AFib since then.  [place] Vasc score of at least 4. On Xarelto. Once again discussed amiodarone. She would like to come off this. Understands that if Afib recurs she will need to go back on it or consider Tikosyn. Will stop amiodarone today and monitor.   4. Small AAA and left iliac aneurysm. Follow up with [facility]. Last  doppler showed AAA was 2.9 cm.     5. Venous varicosities. Encourage sodium restriction and support hose.      Follow up in 3-4 months.

## 2021-10-30 ENCOUNTER — Encounter: Payer: Self-pay | Admitting: Cardiology

## 2021-10-30 ENCOUNTER — Ambulatory Visit (INDEPENDENT_AMBULATORY_CARE_PROVIDER_SITE_OTHER): Payer: Medicare Other | Admitting: Cardiology

## 2021-10-30 VITALS — BP 127/67 | HR 55 | Ht 68.0 in | Wt 121.8 lb

## 2021-10-30 DIAGNOSIS — I428 Other cardiomyopathies: Secondary | ICD-10-CM | POA: Diagnosis not present

## 2021-10-30 DIAGNOSIS — I5022 Chronic systolic (congestive) heart failure: Secondary | ICD-10-CM | POA: Diagnosis not present

## 2021-10-30 DIAGNOSIS — I480 Paroxysmal atrial fibrillation: Secondary | ICD-10-CM

## 2021-10-30 NOTE — Patient Instructions (Signed)
Stop taking amiodarone  Continue your other therapy

## 2021-11-05 ENCOUNTER — Other Ambulatory Visit: Payer: Self-pay | Admitting: Family

## 2021-11-05 DIAGNOSIS — F331 Major depressive disorder, recurrent, moderate: Secondary | ICD-10-CM

## 2021-11-16 ENCOUNTER — Ambulatory Visit: Payer: Medicare Other | Admitting: Cardiology

## 2021-11-17 DIAGNOSIS — D2262 Melanocytic nevi of left upper limb, including shoulder: Secondary | ICD-10-CM | POA: Diagnosis not present

## 2021-11-17 DIAGNOSIS — Z85820 Personal history of malignant melanoma of skin: Secondary | ICD-10-CM | POA: Diagnosis not present

## 2021-11-17 DIAGNOSIS — D692 Other nonthrombocytopenic purpura: Secondary | ICD-10-CM | POA: Diagnosis not present

## 2021-11-17 DIAGNOSIS — L812 Freckles: Secondary | ICD-10-CM | POA: Diagnosis not present

## 2021-11-17 DIAGNOSIS — D1801 Hemangioma of skin and subcutaneous tissue: Secondary | ICD-10-CM | POA: Diagnosis not present

## 2021-11-17 DIAGNOSIS — L821 Other seborrheic keratosis: Secondary | ICD-10-CM | POA: Diagnosis not present

## 2021-11-17 DIAGNOSIS — D225 Melanocytic nevi of trunk: Secondary | ICD-10-CM | POA: Diagnosis not present

## 2021-12-08 ENCOUNTER — Ambulatory Visit: Payer: Medicare Other | Admitting: Family Medicine

## 2021-12-09 ENCOUNTER — Ambulatory Visit (INDEPENDENT_AMBULATORY_CARE_PROVIDER_SITE_OTHER): Payer: Medicare Other | Admitting: Family Medicine

## 2021-12-09 ENCOUNTER — Encounter: Payer: Self-pay | Admitting: Family Medicine

## 2021-12-09 VITALS — BP 134/68 | HR 60 | Temp 97.9°F | Resp 17 | Ht 68.0 in | Wt 121.0 lb

## 2021-12-09 DIAGNOSIS — I10 Essential (primary) hypertension: Secondary | ICD-10-CM

## 2021-12-09 DIAGNOSIS — Z23 Encounter for immunization: Secondary | ICD-10-CM | POA: Diagnosis not present

## 2021-12-09 DIAGNOSIS — F331 Major depressive disorder, recurrent, moderate: Secondary | ICD-10-CM

## 2021-12-09 DIAGNOSIS — I4819 Other persistent atrial fibrillation: Secondary | ICD-10-CM

## 2021-12-09 DIAGNOSIS — I5023 Acute on chronic systolic (congestive) heart failure: Secondary | ICD-10-CM | POA: Diagnosis not present

## 2021-12-09 DIAGNOSIS — R350 Frequency of micturition: Secondary | ICD-10-CM | POA: Diagnosis not present

## 2021-12-09 DIAGNOSIS — F411 Generalized anxiety disorder: Secondary | ICD-10-CM | POA: Diagnosis not present

## 2021-12-09 LAB — POCT URINALYSIS DIP (MANUAL ENTRY)
Bilirubin, UA: NEGATIVE
Blood, UA: NEGATIVE
Glucose, UA: NEGATIVE mg/dL
Ketones, POC UA: NEGATIVE mg/dL
Leukocytes, UA: NEGATIVE
Nitrite, UA: NEGATIVE
Protein Ur, POC: NEGATIVE mg/dL
Spec Grav, UA: 1.01 (ref 1.010–1.025)
Urobilinogen, UA: 0.2 E.U./dL
pH, UA: 6.5 (ref 5.0–8.0)

## 2021-12-09 MED ORDER — ESCITALOPRAM OXALATE 5 MG PO TABS: 5.0000 mg | ORAL_TABLET | Freq: Every day | ORAL | 2 refills | Status: DC

## 2021-12-09 NOTE — Progress Notes (Signed)
Provider:  Alain Honey, MD  Careteam: Patient Care Team: Wardell Honour, MD as PCP - General (Family Medicine) [name], Peter M, MD as PCP - Cardiology (Cardiology) [name], Peter M, MD as Consulting Physician (Cardiology) Milus Banister, MD as Attending Physician (Gastroenterology)  PLACE OF SERVICE:  [place] Directive information Does Patient Have a Medical Advance Directive?: No  Allergies  Allergen Reactions   Hydrocodone Itching   Cephalexin Itching and Swelling    Other reaction(s): edema   Ciprofloxacin Other (See Comments)    Not indicated due to aneurysm in aorta     Doxycycline Other (See Comments)    Unknown reaction   Escitalopram Other (See Comments)    Dry mouth   Levofloxacin Other (See Comments)    Not indicated due to aneurysm in aorta    Moxifloxacin Other (See Comments)    Not indicated due to aneurysm in aorta    Ofloxacin Other (See Comments)    Unknown reaction   Sertraline Other (See Comments)    insomnia Other reaction(s): insomnia   Sulfamethoxazole Hives    Any sulfa meds.   Tape Other (See Comments)    Burns skin.    Latex Rash    "If pt. Makes contact with or wearing"   Neomycin Rash    Chief Complaint  Patient presents with   Medical Management of Chronic Issues    2 month follow up.   Immunizations    Discussed need for immunizations patient would like to speak with PCP     HPI: Patient is a 88 y.o. female.  Patient is here for medical management of chronic problems including hypertension, history of falls, atrial fibs, and depression.  She saw the cardiologist back in August and amiodarone was discontinued.  She says that she feels better since that is stopped but still continues to have problems. She stopped taking the bupropion because it smelled bad.  She does agree that she probably needs an antidepressant. We spent some time talking about her ability to drive.  Mentally I think she can drive but I am  not sure about reflexes.  She still has valid driver's license and an automobile. For blood pressure she takes losartan as well as metoprolol and Xarelto as a anticoagulant She seems adamant that she does not want to use a cane or a walker despite her fall history.  Review of Systems:  Review of Systems  Constitutional:  Positive for malaise/fatigue.  HENT: Negative.    Respiratory: Negative.    Cardiovascular: Negative.   Gastrointestinal: Negative.   Genitourinary:  Positive for dysuria.  Musculoskeletal:  Positive for falls.  Neurological: Negative.   Endo/Heme/Allergies: Negative.   Psychiatric/Behavioral:  Positive for depression.     Past Medical History:  Diagnosis Date   AAA (abdominal aortic aneurysm) (HCC)    2.8 cm 05/2018; 5 year [place] per ACR guidelines   Allergy    all year    Anxiety disorder    Arthritis    Barrett's esophagus 04/2008   Benign neoplasm of colon    Blood transfusion without reported diagnosis    long ago per pt   Cataract    removed both eyes   CHF (congestive heart failure) ([place])    Chronic airway obstruction, not elsewhere classified    Complication of anesthesia    slow to wake up   Depressive disorder, not elsewhere classified    Diverticulitis    Dysthymic disorder  Erosive gastritis 08/2004   GERD (gastroesophageal reflux disease)    Hair thinning    HTN (hypertension)    pt denies    Hyperplastic polyps of stomach 06/2002   Hypothyroidism    ICD (implantable cardiac defibrillator) in place    BiV/ICD; s/p removal of ICD with insertion of BiV PPM 04/26/12   Incisional hernia    Insomnia 08/20/2015   Kyphosis 08/20/2015   LBBB (left bundle branch block)    Melanoma (HCC)    right arm   Mitral regurgitation    Nonischemic cardiomyopathy ([place])    EF initially 20%; Last measurement up to 50% per echo in August of 2012   Osteopenia    Other and unspecified hyperlipidemia    Other and unspecified hyperlipidemia    Pacemaker 04/26/2012    PAF (paroxysmal atrial fibrillation) ([place])    11/2016   Pain in joint, lower leg    Palpitations    Pneumonia    Restless legs syndrome (RLS)    Synovial cyst of popliteal space    Unspecified chronic bronchitis (HCC)    Unspecified nasal polyp    Unspecified sinusitis (chronic)    Past Surgical History:  Procedure Laterality Date   ABDOMINAL HYSTERECTOMY  1076   endometriosis   APPENDECTOMY     BI-VENTRICULAR PACEMAKER INSERTION N/A 04/26/2012   Procedure: BI-VENTRICULAR PACEMAKER INSERTION (CRT-P);  Surgeon: Evans Lance, MD;  Location: [facility] CATH LAB;  Service: Cardiovascular;  Laterality: N/A;   BIV PACEMAKER GENERATOR CHANGEOUT N/A 01/26/2019   Procedure: BIV PACEMAKER GENERATOR CHANGEOUT;  Surgeon: Evans Lance, MD;  Location: [place] CV LAB;  Service: Cardiovascular;  Laterality: N/A;   CARDIOVERSION N/A 05/06/2020   Procedure: CARDIOVERSION;  Surgeon: Pixie Casino, MD;  Location: [place];  Service: Cardiovascular;  Laterality: N/A;   CARDIOVERSION N/A 05/19/2020   Procedure: CARDIOVERSION;  Surgeon: Jerline Pain, MD;  Location: [facility] ENDOSCOPY;  Service: Cardiovascular;  Laterality: N/A;   COLONOSCOPY     defibrillator insertion     s/p removal of previously implanted BiV ICD and insertion of a new BiV pacemaker on 04/26/12   excise mole of lip  2001   Dr. Larena Sox   excision of wen  1984   Dr. Parks Ranger   FEMORAL HERNIA REPAIR  12/25/2012   Dr Dalbert Batman   FEMORAL HERNIA REPAIR  01/04/2013   Recurrent - Dr. Nash Mantis HERNIA REPAIR N/A 10/01/2013   Procedure: LAPAROSCOPIC INCISIONAL HERNIA;  Surgeon: Gayland Curry, MD;  Location: [facility];  Service: General;  Laterality: N/A;   INCISIONAL HERNIA REPAIR N/A 09/07/2018   Procedure: LAPAROSCOPIC ASSISTED REPAIR OF INCISIONAL HERNIA;  Surgeon: Greer Pickerel, MD;  Location: [place];  Service: General;  Laterality: N/A;   INGUINAL HERNIA REPAIR Right 09/26/2012   Procedure: RIGHT INGUINAL HERNIA REPAIR WITH MESH;   Surgeon: Odis Hollingshead, MD;  Location: [place];  Service: General;  Laterality: Right;   INGUINAL HERNIA REPAIR Right 12/25/2012   Procedure: explor right groin, small bowel rescection, tissue repair right femoral hernia;  Surgeon: Adin Hector, MD;  Location: [facility];  Service: General;  Laterality: Right;   INSERTION OF MESH Right 09/26/2012   Procedure: INSERTION OF MESH;  Surgeon: Odis Hollingshead, MD;  Location: [place];  Service: General;  Laterality: Right;   INSERTION OF MESH N/A 10/01/2013   Procedure: INSERTION OF MESH;  Surgeon: Gayland Curry, MD;  Location: [facility];  Service: General;  Laterality: N/A;  LAPAROSCOPY N/A 01/04/2013   Procedure: Diagnostic Laparoscopy, exploratory laparotomy with small bowel resection, closure of right femoral hernia repair;  Surgeon: Madilyn Hook, DO;  Location: [facility];  Service: General;  Laterality: N/A;   Left arm surgery     POLYPECTOMY     RIGHT BREAST LUMPECTOMY  1988   Dr. Marylene Buerger   right knee surgery     arthroscopy: Dr. Shellia Carwin   TONSILLECTOMY     UPPER GASTROINTESTINAL ENDOSCOPY     Social History:   reports that she quit smoking about 27 years ago. Her smoking use included cigarettes. She has a 15.00 pack-year smoking history. She has never used smokeless tobacco. She reports that she does not drink alcohol and does not use drugs.  Family History  Problem Relation Age of Onset   Stroke Father    Heart disease Other        maternal side   Colon cancer Paternal Uncle    Breast cancer Maternal Aunt    Colon cancer Cousin    Diabetes Neg Hx    Colon polyps Neg Hx    Rectal cancer Neg Hx    Stomach cancer Neg Hx     Medications: Patient's Medications  New Prescriptions   No medications on file  Previous Medications   ACETAMINOPHEN (TYLENOL) 500 MG TABLET    Take 1,000 mg by mouth every 8 (eight) hours as needed for moderate pain or mild pain.   ASCORBIC ACID (VITAMIN C) 1000 MG TABLET    Take 1,000 mg by mouth at  bedtime. Ester C   BIOTIN 5000 MCG TABS    Take 5,000 mcg by mouth daily.   BUPROPION (WELLBUTRIN SR) 150 MG 12 HR TABLET    TAKE 1 TABLET BY MOUTH EVERY DAY   CALCIUM CARBONATE-VITAMIN D (CALCIUM + D PO)    Take 2 tablets by mouth daily. 1000 units Vitamin D 1200 mg calcium   CHOLECALCIFEROL (VITAMIN D3) 25 MCG (1000 UT) CAPS    Take 1 capsule (1,000 Units total) by mouth daily.   CYANOCOBALAMIN (VITAMIN B 12) 500 MCG TABS    Take 1,000 mcg by mouth daily.    ESTRADIOL (ESTRACE) 0.1 MG/GM VAGINAL CREAM    Place 1 g vaginally 2 (two) times a week.   FEXOFENADINE (ALLEGRA) 180 MG TABLET    Take 180 mg by mouth daily.   FUROSEMIDE (LASIX) 20 MG TABLET    Take 1 tablet (20 mg total) by mouth daily.   LEVOTHYROXINE (SYNTHROID) 100 MCG TABLET    Take 1 tablet (100 mcg total) by mouth daily before breakfast.   LOSARTAN (COZAAR) 25 MG TABLET    TAKE 1 TABLET BY MOUTH EVERY DAY   METOPROLOL SUCCINATE (TOPROL XL) 25 MG 24 HR TABLET    Take 0.5 tablets (12.5 mg total) by mouth daily.   POLYETHYL GLYCOL-PROPYL GLYCOL (SYSTANE) 0.4-0.3 % SOLN    Place 1 drop into both eyes daily as needed (dry eyes).   RABEPRAZOLE (ACIPHEX) 20 MG TABLET    Take one tablet by mouth once daily for stomach   XARELTO 15 MG TABS TABLET    TAKE 1 TABLET DAILY WITH   SUPPER (DOSE REDUCED)  Modified Medications   No medications on file  Discontinued Medications   SACCHAROMYCES BOULARDII (FLORASTOR) 250 MG CAPSULE    Take 1 capsule (250 mg total) by mouth 2 (two) times daily. For 10 days.   VITAMIN D, ERGOCALCIFEROL, (DRISDOL) 1.25 MG (50000 UNIT) CAPS CAPSULE  Take 1 capsule (50,000 Units total) by mouth every 7 (seven) days.    Physical Exam:  Vitals:   12/09/21 1502  BP: 134/68  Pulse: 60  Resp: 17  Temp: 97.9 F (36.6 C)  TempSrc: Temporal  SpO2: 98%  Weight: 121 lb (54.9 kg)  Height: '5\' 8"'$  (1.727 m)   Body mass index is 18.4 kg/m. Wt Readings from Last 3 Encounters:  12/09/21 121 lb (54.9 kg)  10/30/21 121  lb 12.8 oz (55.2 kg)  10/06/21 122 lb 3.2 oz (55.4 kg)    Physical Exam Constitutional:      Appearance: Normal appearance.  HENT:     Mouth/Throat:     Mouth: Mucous membranes are moist.     Pharynx: Oropharynx is clear.  Eyes:     Extraocular Movements: Extraocular movements intact.     Pupils: Pupils are equal, round, and reactive to light.  Cardiovascular:     Rate and Rhythm: Normal rate and regular rhythm.  Pulmonary:     Effort: Pulmonary effort is normal.     Breath sounds: Normal breath sounds.  Abdominal:     General: Abdomen is flat. Bowel sounds are normal.  Musculoskeletal:        General: Normal range of motion.     Cervical back: Normal range of motion.  Neurological:     General: No focal deficit present.     Mental Status: She is alert and oriented to person, place, and time.  Psychiatric:        Mood and Affect: Mood normal.        Behavior: Behavior normal.     Labs reviewed: Basic Metabolic Panel: Recent Labs    01/01/21 1429 04/07/21 1548 04/08/21 0414 08/26/21 1449 10/06/21 1429  NA 138   < > 142 141 138  K 4.9   < > 3.5 4.3 4.4  CL 101   < > 112* 107 106  CO2 21   < > 21* 25 24  GLUCOSE 81   < > 113* 97 81  BUN 31*   < > 20 27* 27*  CREATININE 1.25*   < > 0.99 1.18* 1.08*  CALCIUM 9.4   < > 9.0 9.1 9.0  MG  --   --  2.0  --   --   TSH 4.330  --   --  1.44  --    < > = values in this interval not displayed.   Liver Function Tests: Recent Labs    01/01/21 1429 04/07/21 1548 04/08/21 0414 08/26/21 1449  AST 21 53* 58* 12  ALT '12 24 25 '$ 5*  ALKPHOS 86 67 58  --   BILITOT 0.4 1.3* 1.1 0.4  PROT 6.5 5.9* 5.3* 6.3  ALBUMIN 4.6 3.5 3.1*  --    No results for input(s): "LIPASE", "AMYLASE" in the last 8760 hours. No results for input(s): "AMMONIA" in the last 8760 hours. CBC: Recent Labs    04/07/21 1548 04/08/21 0414 04/10/21 0544 08/26/21 1449  WBC 9.7 9.5 9.9 6.0  NEUTROABS 7.9* 7.5  --  4,128  HGB 11.4* 10.9* 10.8* 11.2*   HCT 34.7* 32.3* 32.5* 33.2*  MCV 94.0 94.2 92.9 92.2  PLT 157 146* 163 211   Lipid Panel: No results for input(s): "CHOL", "HDL", "LDLCALC", "TRIG", "CHOLHDL", "LDLDIRECT" in the last 8760 hours. TSH: Recent Labs    01/01/21 1429 08/26/21 1449  TSH 4.330 1.44   A1C: No results found for: "HGBA1C"   Assessment/Plan  1. Flu vaccine need Patient prefers to receive injection in better musclelike hip  2. Acute on chronic systolic CHF (congestive heart failure) (HCC) No shortness of breath or edema.  Ejection fraction has declined some per cardiology note  3. Generalized anxiety disorder She is both anxious and depressed.  We will discontinue Wellbutrin in favor of Lexapro 5 mg  4. Moderate episode of recurrent major depressive disorder (HCC) Use Lexapro to treat both anxiety and depression  5. Frequency of urination Check UA today last culture showed E. coli sensitive to most antibiotics.  Apparently she was treated with amoxicillin Dip urine is negative  6. Primary hypertension Blood pressure is good at 134/68.  Continue same medicines  7. Persistent atrial fibrillation (HCC) Would seem to be in sinus rhythm today.  Plan is if she goes back into A-fib can consider Tikosyn  Alain Honey, MD [place] [phone]

## 2022-02-01 NOTE — Progress Notes (Unsigned)
[name] Date of Birth: 1933/02/09 Medical Record #[id number]  History of Present Illness: [name] is seen today for follow up. She has multiple medical issues which include a cardiomyopathy, underlying BiV/ICD which resulted in improvement of her EF, LBBB, HTN, thyroid disease, melanoma, anxiety/depression and chronic systolic HF.   In September 2018 she was noted in device clinic to have paroxysmal Afib with controlled rate. She was started on Eliquis. [place] Vasc score of at least 4. She developed itching on Eliquis and switched to Xarelto. Itching did not go away so she was switched to Coumadin. After about a month the itching resolved.  She was later switched back to Xarelto without rash. She had change out of her ICD generator in November due to ERI. Her device was downgraded to Biventricular pacing only. Follow up with Dr Lovena Le  In February showed normal device function.  She was seen in the office on 04/28/20 with complaints of increased SOB, edema and fatigue. Was found to be in Afib with rate 125. Patient admitted from the office with acute on chronic CHF felt to be secondary to atrial fibrillation with RVR. BNP elevated at 1,299. Chest x-ray consistent with CHF with mild pulmonary edema and small bilateral pleural effusions. Echo showed LVEF of 25-30% (down from 55-60% in 06/2019) with global hypokinesis and moderate to severe MR. RV mildly enlarged with mildly reduced systolic function, severe TR, and moderately elevated PASP. Also showed small pericardial effusion. Patient was diuresed with IV Lasix. Net negative 8.078 L during admission. Discharge weight 117lb but this may not be accurate (was 130.5lbs on 05/19/2020). Was discharged on Lasix '40mg'$  daily, Losartan '25mg'$  daily (rather than home Trandolapril), and Toprol-XL '25mg'$  daily (rather than home Lopressor). Did not add spironolactone  given soft BP at times. Patient also does not want to be on a lot of new medications. She was  considered for Tikosyn but did not want to start this. She was started on amiodarone.  Drop in EF felt to be secondary to atrial fibrillation with RVR. Given advance age, it was recommended to treat atrial fibrillation and heart failure medically and then reassess EF in 3 months. No ischemic evaluation felt to be necessary this admission.     On her last visit we added aldactone 25 mg daily. This resulted in  increase in creatinine and potassium aldactone was discontinued.   Repeat Echo in August showed improvement in EF to 45-50%. Last device check in August showed no recurrent Afib. She never did start Jardiance and in fact she does have a history of frequent UTIs.   She was hospitalized 04/07/21 - 04/10/21 after a mechanical fall suffering fractures of the right superior and left inferior pubic rami with moderate displacement, Orthopedics consulted: WBAT progress gradually with walker and 1-2 person assist.  No noted acute cardiac issues.  On follow up she still complains of fatigue and imbalance. Complains of hair falling out. Has instability with gait and imbalance. On visit with EP in June her Toprol was reduced to 12.5 mg daily. Still very concerned about effects of amiodarone on her system.   Current Outpatient Medications  Medication Sig Dispense Refill   acetaminophen (TYLENOL) 500 MG tablet Take 1,000 mg by mouth every 8 (eight) hours as needed for moderate pain or mild pain.     Ascorbic Acid (VITAMIN C) 1000 MG tablet Take 1,000 mg by mouth at bedtime. Ester C     Biotin 5000 MCG TABS Take 5,000 mcg by  mouth daily.     buPROPion (WELLBUTRIN SR) 150 MG 12 hr tablet TAKE 1 TABLET BY MOUTH EVERY DAY 90 tablet 2   Calcium Carbonate-Vitamin D (CALCIUM + D PO) Take 2 tablets by mouth daily. 1000 units Vitamin D 1200 mg calcium     Cholecalciferol (VITAMIN D3) 25 MCG (1000 UT) CAPS Take 1 capsule (1,000 Units total) by mouth daily. 60 capsule 11   Cyanocobalamin (VITAMIN B 12) 500 MCG TABS Take  1,000 mcg by mouth daily.      escitalopram (LEXAPRO) 5 MG tablet Take 1 tablet (5 mg total) by mouth daily. 30 tablet 2   estradiol (ESTRACE) 0.1 MG/GM vaginal cream Place 1 g vaginally 2 (two) times a week.     fexofenadine (ALLEGRA) 180 MG tablet Take 180 mg by mouth daily.     furosemide (LASIX) 20 MG tablet Take 1 tablet (20 mg total) by mouth daily. 30 tablet    levothyroxine (SYNTHROID) 100 MCG tablet Take 1 tablet (100 mcg total) by mouth daily before breakfast. 90 tablet 1   losartan (COZAAR) 25 MG tablet TAKE 1 TABLET BY MOUTH EVERY DAY 90 tablet 3   metoprolol succinate (TOPROL XL) 25 MG 24 hr tablet Take 0.5 tablets (12.5 mg total) by mouth daily. 45 tablet 3   Polyethyl Glycol-Propyl Glycol (SYSTANE) 0.4-0.3 % SOLN Place 1 drop into both eyes daily as needed (dry eyes).     RABEprazole (ACIPHEX) 20 MG tablet Take one tablet by mouth once daily for stomach 90 tablet 1   XARELTO 15 MG TABS tablet TAKE 1 TABLET DAILY WITH   SUPPER (DOSE REDUCED) 90 tablet 3   No current facility-administered medications for this visit.    Allergies  Allergen Reactions   Hydrocodone Itching   Cephalexin Itching and Swelling    Other reaction(s): edema   Ciprofloxacin Other (See Comments)    Not indicated due to aneurysm in aorta     Doxycycline Other (See Comments)    Unknown reaction   Escitalopram Other (See Comments)    Dry mouth   Levofloxacin Other (See Comments)    Not indicated due to aneurysm in aorta    Moxifloxacin Other (See Comments)    Not indicated due to aneurysm in aorta    Ofloxacin Other (See Comments)    Unknown reaction   Sertraline Other (See Comments)    insomnia Other reaction(s): insomnia   Sulfamethoxazole Hives    Any sulfa meds.   Tape Other (See Comments)    Burns skin.    Latex Rash    "If pt. Makes contact with or wearing"   Neomycin Rash    Past Medical History:  Diagnosis Date   AAA (abdominal aortic aneurysm) (HCC)    2.8 cm 05/2018; 5 year [place]  per ACR guidelines   Allergy    all year    Anxiety disorder    Arthritis    Barrett's esophagus 04/2008   Benign neoplasm of colon    Blood transfusion without reported diagnosis    long ago per pt   Cataract    removed both eyes   CHF (congestive heart failure) ([place])    Chronic airway obstruction, not elsewhere classified    Complication of anesthesia    slow to wake up   Depressive disorder, not elsewhere classified    Diverticulitis    Dysthymic disorder    Erosive gastritis 08/2004   GERD (gastroesophageal reflux disease)    Hair thinning  HTN (hypertension)    pt denies    Hyperplastic polyps of stomach 06/2002   Hypothyroidism    ICD (implantable cardiac defibrillator) in place    BiV/ICD; s/p removal of ICD with insertion of BiV PPM 04/26/12   Incisional hernia    Insomnia 08/20/2015   Kyphosis 08/20/2015   LBBB (left bundle branch block)    Melanoma (HCC)    right arm   Mitral regurgitation    Nonischemic cardiomyopathy ([place])    EF initially 20%; Last measurement up to 50% per echo in August of 2012   Osteopenia    Other and unspecified hyperlipidemia    Other and unspecified hyperlipidemia    Pacemaker 04/26/2012   PAF (paroxysmal atrial fibrillation) ([place])    11/2016   Pain in joint, lower leg    Palpitations    Pneumonia    Restless legs syndrome (RLS)    Synovial cyst of popliteal space    Unspecified chronic bronchitis (HCC)    Unspecified nasal polyp    Unspecified sinusitis (chronic)     Past Surgical History:  Procedure Laterality Date   ABDOMINAL HYSTERECTOMY  1076   endometriosis   APPENDECTOMY     BI-VENTRICULAR PACEMAKER INSERTION N/A 04/26/2012   Procedure: BI-VENTRICULAR PACEMAKER INSERTION (CRT-P);  Surgeon: Evans Lance, MD;  Location: [facility] CATH LAB;  Service: Cardiovascular;  Laterality: N/A;   BIV PACEMAKER GENERATOR CHANGEOUT N/A 01/26/2019   Procedure: BIV PACEMAKER GENERATOR CHANGEOUT;  Surgeon: Evans Lance, MD;  Location: [place] CV LAB;  Service: Cardiovascular;  Laterality: N/A;   CARDIOVERSION N/A 05/06/2020   Procedure: CARDIOVERSION;  Surgeon: Pixie Casino, MD;  Location: [place];  Service: Cardiovascular;  Laterality: N/A;   CARDIOVERSION N/A 05/19/2020   Procedure: CARDIOVERSION;  Surgeon: Jerline Pain, MD;  Location[facility] ENDOSCOPY;  Service: Cardiovascular;  Laterality: N/A;   COLONOSCOPY     defibrillator insertion     s/p removal of previously implanted BiV ICD and insertion of a new BiV pacemaker on 04/26/12   excise mole of lip  2001   Dr. Larena Sox   excision of wen  1984   Dr. Parks Ranger   FEMORAL HERNIA REPAIR  12/25/2012   Dr Dalbert Batman   FEMORAL HERNIA REPAIR  01/04/2013   Recurrent - Dr. Nash Mantis HERNIA REPAIR N/A 10/01/2013   Procedure: LAPAROSCOPIC INCISIONAL HERNIA;  Surgeon: Gayland Curry, MD;  Location: [facility];  Service: General;  Laterality: N/A;   INCISIONAL HERNIA REPAIR N/A 09/07/2018   Procedure: LAPAROSCOPIC ASSISTED REPAIR OF INCISIONAL HERNIA;  Surgeon: Greer Pickerel, MD;  Location: [place];  Service: General;  Laterality: N/A;   INGUINAL HERNIA REPAIR Right 09/26/2012   Procedure: RIGHT INGUINAL HERNIA REPAIR WITH MESH;  Surgeon: Odis Hollingshead, MD;  Location: [place];  Service: General;  Laterality: Right;   INGUINAL HERNIA REPAIR Right 12/25/2012   Procedure: explor right groin, small bowel rescection, tissue repair right femoral hernia;  Surgeon: Adin Hector, MD;  Location: [facility];  Service: General;  Laterality: Right;   INSERTION OF MESH Right 09/26/2012   Procedure: INSERTION OF MESH;  Surgeon: Odis Hollingshead, MD;  Location: [place];  Service: General;  Laterality: Right;   INSERTION OF MESH N/A 10/01/2013   Procedure: INSERTION OF MESH;  Surgeon: Gayland Curry, MD;  Location: [facility];  Service: General;  Laterality: N/A;   LAPAROSCOPY N/A 01/04/2013   Procedure: Diagnostic Laparoscopy, exploratory laparotomy with small bowel resection, closure  of right  femoral hernia repair;  Surgeon: Madilyn Hook, DO;  Location: [facility];  Service: General;  Laterality: N/A;   Left arm surgery     POLYPECTOMY     RIGHT BREAST LUMPECTOMY  1988   Dr. Marylene Buerger   right knee surgery     arthroscopy: Dr. Shellia Carwin   TONSILLECTOMY     UPPER GASTROINTESTINAL ENDOSCOPY      Social History   Tobacco Use  Smoking Status Former   Packs/day: 1.00   Years: 15.00   Total pack years: 15.00   Types: Cigarettes   Quit date: 12/22/1993   Years since quitting: 28.1  Smokeless Tobacco Never  Tobacco Comments   Does'nt reall year quit     Social History   Substance and Sexual Activity  Alcohol Use No   Alcohol/week: 0.0 standard drinks of alcohol    Family History  Problem Relation Age of Onset   Stroke Father    Heart disease Other        maternal side   Colon cancer Paternal Uncle    Breast cancer Maternal Aunt    Colon cancer Cousin    Diabetes Neg Hx    Colon polyps Neg Hx    Rectal cancer Neg Hx    Stomach cancer Neg Hx     Review of Systems: The review of systems is per the HPI.   All other systems were reviewed and are negative.  Physical Exam: There were no vitals taken for this visit. GENERAL:  thin, elderly thin WF in NAD HEENT:  PERRL, EOMI, sclera are clear. Oropharynx is clear. NECK:  No JVD, carotid upstroke brisk and symmetric, no bruits, no thyromegaly or adenopathy LUNGS:  Clear to auscultation bilaterally CHEST:  Unremarkable HEART:  RRR,  PMI not displaced or sustained,S1 and S2 within normal limits, no S3, no S4: no clicks, no rubs, no murmurs ABD:  Soft, nontender. BS +, no masses or bruits. No hepatomegaly, no splenomegaly EXT:  2 + pulses throughout,  no edema, varicosities.  SKIN:  Warm and dry.  No rashes NEURO:  Alert and oriented x 3. Cranial nerves II through XII intact. PSYCH:  Cognitively intact   Wt Readings from Last 3 Encounters:  12/09/21 121 lb (54.9 kg)  10/30/21 121 lb 12.8 oz (55.2 kg)  10/06/21  122 lb 3.2 oz (55.4 kg)     LABORATORY DATA: Lab Results  Component Value Date   WBC 6.0 08/26/2021   HGB 11.2 (L) 08/26/2021   HCT 33.2 (L) 08/26/2021   PLT 211 08/26/2021   GLUCOSE 81 10/06/2021   CHOL 159 11/22/2019   TRIG 137 11/22/2019   HDL 53 11/22/2019   LDLCALC 82 11/22/2019   ALT 5 (L) 08/26/2021   AST 12 08/26/2021   NA 138 10/06/2021   K 4.4 10/06/2021   CL 106 10/06/2021   CREATININE 1.08 (H) 10/06/2021   BUN 27 (H) 10/06/2021   CO2 24 10/06/2021   TSH 1.44 08/26/2021   INR 1.7 (H) 05/18/2020     Echo: 07/22/16: Study Conclusions   - Left ventricle: The cavity size was mildly dilated. Wall   thickness was normal. Systolic function was normal. The estimated   ejection fraction was in the range of 50% to 55%. Wall motion was   normal; there were no regional wall motion abnormalities. - Aortic valve: There was trivial regurgitation. - Mitral valve: There was mild regurgitation. - Atrial septum: No defect or patent foramen ovale was  identified.   Echo 06/29/19: IMPRESSIONS     1. Left ventricular ejection fraction, by estimation, is 55 to 60%. The  left ventricle has normal function. The left ventricle has no regional  wall motion abnormalities. Left ventricular diastolic parameters are  consistent with Grade II diastolic  dysfunction (pseudonormalization). Elevated left atrial pressure.   2. Right ventricular systolic function is normal. The right ventricular  size is normal. There is moderately elevated pulmonary artery systolic  pressure.   3. Left atrial size was moderately dilated.   4. The mitral valve is normal in structure. Mild to moderate mitral valve  regurgitation.   5. Tricuspid valve regurgitation is mild to moderate.   6. The aortic valve is tricuspid. Aortic valve regurgitation is mild. No  aortic stenosis is present.   7. Aortic dilatation noted. There is borderline dilatation of the  ascending aorta measuring 37 mm.   8. The inferior  vena cava is dilated in size with >50% respiratory  variability, suggesting right atrial pressure of 8 mmHg.    Echocardiogram 05/17/2020: Impressions:  1. Left ventricular ejection fraction, by estimation, is 25 to 30%. The  left ventricle has severely decreased function. The left ventricle  demonstrates global hypokinesis. Left ventricular diastolic function could  not be evaluated.   2. Right ventricular systolic function is mildly reduced. The right  ventricular size is mildly enlarged. There is moderately elevated  pulmonary artery systolic pressure. The estimated right ventricular  systolic pressure is 13.0 mmHg.   3. Left atrial size was severely dilated.   4. Right atrial size was mildly dilated.   5. A small pericardial effusion is present. The pericardial effusion is  circumferential.   6. The mitral valve is grossly normal. Moderate to severe mitral valve  regurgitation. No evidence of mitral stenosis.   7. Tricuspid valve regurgitation is severe.   8. The aortic valve is tricuspid. Aortic valve regurgitation is not  visualized. No aortic stenosis is present.   9. The inferior vena cava is dilated in size with <50% respiratory  variability, suggesting right atrial pressure of 15 mmHg.   Comparison(s): Changes from prior study are noted. EF now severely  reduced. Moderately dilated RV. Moderate to severe MR. Severe TR. RVSP ~49  mmHG.   Echo 10/09/20: IMPRESSIONS     1. Significant improvement in EF since echo done 05/17/20. Left  ventricular ejection fraction, by estimation, is 45 to 50%. The left  ventricle has mildly decreased function. The left ventricle demonstrates  global hypokinesis. The left ventricular  internal cavity size was mildly dilated. Left ventricular diastolic  parameters were normal.   2. ICD wires in RA/RV. Right ventricular systolic function is normal. The  right ventricular size is normal. There is mildly elevated pulmonary  artery systolic  pressure.   3. Left atrial size was moderately dilated.   4. The pericardial effusion is posterior to the left ventricle.   5. ? MR from restricted posterior leaflet motion . The mitral valve is  abnormal. Moderate mitral valve regurgitation. No evidence of mitral  stenosis.   6. Tricuspid valve regurgitation is moderate.   7. The aortic valve is tricuspid. Aortic valve regurgitation is trivial.  No aortic stenosis is present.   8. The inferior vena cava is normal in size with greater than 50%  respiratory variability, suggesting right atrial pressure of 3 mmHg.   Assessment / Plan: 1.  History of dialted Cardiomyopathy with chronic systolic CHF.  EF  normal in  April 2021. Has BiV pacemaker in place. Significant worsening of CHF in Feb. EF down to 25-30% in setting of Afib with RVR. Clinically improved.  Weight is stable  no edema. Maintaining NSR on exam and by pacer check.  Repeat Echo in August showed improved EF to 45-50%. Will continue Lasix, losartan and Toprol. Not a candidate for SGLT 2 inhibitor due to history of UTIs.   2. NSVT. Follow up ICD/biV in device clinic. S/p generator change out in November 2020. Normal device function.   3. Paroxysmal Afib. persistent and ERAD post cardioversion on May 06, 2020. RVR. Repeat DCCV post amiodarone in March 2022. Now in NSR. No recurrent AFib since then.  [place] Vasc score of at least 4. On Xarelto. Once again discussed amiodarone. She would like to come off this. Understands that if Afib recurs she will need to go back on it or consider Tikosyn. Will stop amiodarone today and monitor.   4. Small AAA and left iliac aneurysm. Follow up with [facility]. Last doppler showed AAA was 2.9 cm.     5. Venous varicosities. Encourage sodium restriction and support hose.      Follow up in 3-4 months.

## 2022-02-03 ENCOUNTER — Encounter: Payer: Self-pay | Admitting: Cardiology

## 2022-02-03 ENCOUNTER — Ambulatory Visit: Payer: Medicare Other | Attending: Cardiology | Admitting: Cardiology

## 2022-02-03 VITALS — BP 120/72 | HR 55 | Ht 68.0 in | Wt 118.2 lb

## 2022-02-03 DIAGNOSIS — I5022 Chronic systolic (congestive) heart failure: Secondary | ICD-10-CM | POA: Diagnosis not present

## 2022-02-03 DIAGNOSIS — I447 Left bundle-branch block, unspecified: Secondary | ICD-10-CM | POA: Diagnosis not present

## 2022-02-03 DIAGNOSIS — I480 Paroxysmal atrial fibrillation: Secondary | ICD-10-CM | POA: Diagnosis not present

## 2022-02-03 DIAGNOSIS — I4729 Other ventricular tachycardia: Secondary | ICD-10-CM

## 2022-02-18 ENCOUNTER — Other Ambulatory Visit: Payer: Self-pay | Admitting: Family Medicine

## 2022-02-18 DIAGNOSIS — E039 Hypothyroidism, unspecified: Secondary | ICD-10-CM

## 2022-02-18 DIAGNOSIS — L659 Nonscarring hair loss, unspecified: Secondary | ICD-10-CM

## 2022-03-12 ENCOUNTER — Other Ambulatory Visit: Payer: Self-pay | Admitting: *Deleted

## 2022-03-12 DIAGNOSIS — K219 Gastro-esophageal reflux disease without esophagitis: Secondary | ICD-10-CM

## 2022-03-12 MED ORDER — RIVAROXABAN 15 MG PO TABS: ORAL_TABLET | ORAL | 1 refills | Status: DC

## 2022-03-12 MED ORDER — RABEPRAZOLE SODIUM 20 MG PO TBEC: DELAYED_RELEASE_TABLET | ORAL | 1 refills | Status: DC

## 2022-03-12 NOTE — Telephone Encounter (Signed)
[facility] requested refill.  ?

## 2022-03-12 NOTE — Telephone Encounter (Signed)
Xarelto '15mg'$  refill request received. Pt is 89 years old, weight-53.6kg, Crea-1.08 on 10/06/2021, last seen by Dr. [name] on 10/30/2021, Diagnosis-Afib, CrCl-31.49m/min; Dose is appropriate based on dosing criteria. Will send in refill to requested pharmacy.

## 2022-04-14 ENCOUNTER — Telehealth: Payer: Self-pay

## 2022-04-14 NOTE — Telephone Encounter (Signed)
I spoke with the patient and let her know that this is the second time she rescheduled her appointment. I told her that it is important that she makes the next appointment. We have to be able to check her pacemaker.   I transferred the call to [facility] to schedule.

## 2022-04-16 ENCOUNTER — Telehealth: Payer: Self-pay | Admitting: Cardiology

## 2022-04-16 NOTE — Telephone Encounter (Signed)
Pt wanted an earlier appointment than July. Offered her May 7 but she wanted a late in the afternoon slot. She kept July appointment.

## 2022-04-16 NOTE — Telephone Encounter (Signed)
Patient called in asking to speak to the nurse about an appt. Please advise

## 2022-04-30 ENCOUNTER — Ambulatory Visit: Payer: Medicare Other | Attending: Cardiology

## 2022-04-30 DIAGNOSIS — I5022 Chronic systolic (congestive) heart failure: Secondary | ICD-10-CM | POA: Diagnosis not present

## 2022-04-30 DIAGNOSIS — I447 Left bundle-branch block, unspecified: Secondary | ICD-10-CM | POA: Insufficient documentation

## 2022-04-30 DIAGNOSIS — I480 Paroxysmal atrial fibrillation: Secondary | ICD-10-CM | POA: Insufficient documentation

## 2022-04-30 LAB — CUP PACEART INCLINIC DEVICE CHECK
Date Time Interrogation Session: 20240223170848
Implantable Lead Connection Status: 753985
Implantable Lead Connection Status: 753985
Implantable Lead Connection Status: 753985
Implantable Lead Implant Date: 20070309
Implantable Lead Implant Date: 20070309
Implantable Lead Implant Date: 20070309
Implantable Lead Location: 753858
Implantable Lead Location: 753859
Implantable Lead Location: 753860
Implantable Lead Model: 158
Implantable Lead Model: 4087
Implantable Lead Model: 4543
Implantable Lead Serial Number: 121652
Implantable Lead Serial Number: 168416
Implantable Lead Serial Number: 268502
Implantable Pulse Generator Implant Date: 20201120
Lead Channel Impedance Value: 397 Ohm
Lead Channel Impedance Value: 468 Ohm
Lead Channel Impedance Value: 521 Ohm
Lead Channel Pacing Threshold Amplitude: 1 V
Lead Channel Pacing Threshold Amplitude: 1.8 V
Lead Channel Pacing Threshold Amplitude: 3 V
Lead Channel Pacing Threshold Pulse Width: 0.6 ms
Lead Channel Pacing Threshold Pulse Width: 1 ms
Lead Channel Pacing Threshold Pulse Width: 1 ms
Lead Channel Sensing Intrinsic Amplitude: 1.2 mV
Lead Channel Sensing Intrinsic Amplitude: 16.2 mV
Lead Channel Sensing Intrinsic Amplitude: 5.4 mV
Lead Channel Setting Pacing Amplitude: 2.5 V
Lead Channel Setting Pacing Amplitude: 2.6 V
Lead Channel Setting Pacing Amplitude: 4 V
Lead Channel Setting Pacing Pulse Width: 1 ms
Lead Channel Setting Pacing Pulse Width: 1 ms
Lead Channel Setting Sensing Sensitivity: 2.5 mV
Lead Channel Setting Sensing Sensitivity: 4 mV
Pulse Gen Serial Number: 714246
Zone Setting Status: 755011

## 2022-04-30 NOTE — Progress Notes (Signed)
CRT-P device check in clinic. Normal device function. Thresholds, sensing, impedance consistent with previous measurements. Histograms are flat.  Discussed with BS rep and Pt.  Will try increasing base rate to 60 BPM and increased rate response from 6 to 8.  Pt will notify device clinic if she does not like this change. AFib burden <1% on Xarelto. Patient bi-ventricularly pacing 98% of the time. Device programmed with appropriate safety margins. Device heart failure diagnostics are within normal limits and stable over time. Estimated longevity 3.5 years. Follow up with Dr. Lovena Le scheduled.

## 2022-04-30 NOTE — Patient Instructions (Addendum)
Medication Instructions:  Your physician recommends that you continue on your current medications as directed. Please refer to the Current Medication list given to you today.  Labwork: None ordered.  Testing/Procedures: None ordered.  Follow-Up:  August 30, 2022 with Dr. Lovena Le

## 2022-07-01 ENCOUNTER — Encounter (HOSPITAL_COMMUNITY): Payer: Medicare Other

## 2022-07-01 ENCOUNTER — Ambulatory Visit: Payer: Medicare Other | Admitting: Vascular Surgery

## 2022-07-01 ENCOUNTER — Other Ambulatory Visit (HOSPITAL_COMMUNITY): Payer: Medicare Other

## 2022-07-21 ENCOUNTER — Other Ambulatory Visit: Payer: Self-pay | Admitting: *Deleted

## 2022-07-21 DIAGNOSIS — I739 Peripheral vascular disease, unspecified: Secondary | ICD-10-CM

## 2022-07-21 DIAGNOSIS — I714 Abdominal aortic aneurysm, without rupture, unspecified: Secondary | ICD-10-CM

## 2022-07-29 ENCOUNTER — Ambulatory Visit (HOSPITAL_COMMUNITY): Payer: Medicare Other

## 2022-07-29 ENCOUNTER — Ambulatory Visit: Payer: Medicare Other | Admitting: Vascular Surgery

## 2022-08-16 ENCOUNTER — Other Ambulatory Visit: Payer: Self-pay | Admitting: Cardiology

## 2022-08-17 ENCOUNTER — Other Ambulatory Visit: Payer: Self-pay

## 2022-08-17 DIAGNOSIS — L659 Nonscarring hair loss, unspecified: Secondary | ICD-10-CM

## 2022-08-17 DIAGNOSIS — E039 Hypothyroidism, unspecified: Secondary | ICD-10-CM

## 2022-08-17 MED ORDER — LEVOTHYROXINE SODIUM 100 MCG PO TABS: 100.0000 ug | ORAL_TABLET | Freq: Every day | ORAL | 0 refills | Status: DC

## 2022-08-25 ENCOUNTER — Ambulatory Visit (HOSPITAL_COMMUNITY): Payer: Medicare Other

## 2022-08-26 ENCOUNTER — Telehealth: Payer: Self-pay

## 2022-08-26 NOTE — Telephone Encounter (Signed)
The patient states she wants to reschedule her appointment with Dr. Ladona Ridgel. I told her a scheduler will call her to reschedule.

## 2022-08-30 ENCOUNTER — Ambulatory Visit: Payer: Medicare Other | Admitting: Internal Medicine

## 2022-09-02 ENCOUNTER — Ambulatory Visit: Payer: Medicare Other | Admitting: Vascular Surgery

## 2022-09-04 ENCOUNTER — Emergency Department (HOSPITAL_COMMUNITY): Payer: Medicare Other

## 2022-09-04 ENCOUNTER — Inpatient Hospital Stay (HOSPITAL_COMMUNITY)
Admission: EM | Admit: 2022-09-04 | Discharge: 2022-09-10 | DRG: 689 | Disposition: A | Discharge status: Skilled Nursing Facility | Payer: Medicare Other | Attending: Internal Medicine | Admitting: Internal Medicine

## 2022-09-04 ENCOUNTER — Encounter (HOSPITAL_COMMUNITY): Payer: Self-pay

## 2022-09-04 ENCOUNTER — Other Ambulatory Visit: Payer: Self-pay

## 2022-09-04 DIAGNOSIS — S0990XA Unspecified injury of head, initial encounter: Secondary | ICD-10-CM | POA: Diagnosis not present

## 2022-09-04 DIAGNOSIS — Z8701 Personal history of pneumonia (recurrent): Secondary | ICD-10-CM

## 2022-09-04 DIAGNOSIS — I428 Other cardiomyopathies: Secondary | ICD-10-CM | POA: Diagnosis present

## 2022-09-04 DIAGNOSIS — Z79899 Other long term (current) drug therapy: Secondary | ICD-10-CM

## 2022-09-04 DIAGNOSIS — R188 Other ascites: Secondary | ICD-10-CM | POA: Diagnosis present

## 2022-09-04 DIAGNOSIS — R410 Disorientation, unspecified: Secondary | ICD-10-CM | POA: Diagnosis not present

## 2022-09-04 DIAGNOSIS — Z87440 Personal history of urinary (tract) infections: Secondary | ICD-10-CM

## 2022-09-04 DIAGNOSIS — Z86010 Personal history of colonic polyps: Secondary | ICD-10-CM

## 2022-09-04 DIAGNOSIS — M25551 Pain in right hip: Secondary | ICD-10-CM | POA: Diagnosis not present

## 2022-09-04 DIAGNOSIS — F341 Dysthymic disorder: Secondary | ICD-10-CM | POA: Diagnosis present

## 2022-09-04 DIAGNOSIS — I340 Nonrheumatic mitral (valve) insufficiency: Secondary | ICD-10-CM | POA: Diagnosis present

## 2022-09-04 DIAGNOSIS — Z681 Body mass index (BMI) 19 or less, adult: Secondary | ICD-10-CM

## 2022-09-04 DIAGNOSIS — E869 Volume depletion, unspecified: Secondary | ICD-10-CM | POA: Diagnosis present

## 2022-09-04 DIAGNOSIS — Z85820 Personal history of malignant melanoma of skin: Secondary | ICD-10-CM

## 2022-09-04 DIAGNOSIS — Z87891 Personal history of nicotine dependence: Secondary | ICD-10-CM | POA: Diagnosis not present

## 2022-09-04 DIAGNOSIS — E46 Unspecified protein-calorie malnutrition: Secondary | ICD-10-CM | POA: Diagnosis not present

## 2022-09-04 DIAGNOSIS — S199XXA Unspecified injury of neck, initial encounter: Secondary | ICD-10-CM | POA: Diagnosis not present

## 2022-09-04 DIAGNOSIS — R531 Weakness: Principal | ICD-10-CM

## 2022-09-04 DIAGNOSIS — J4489 Other specified chronic obstructive pulmonary disease: Secondary | ICD-10-CM | POA: Diagnosis present

## 2022-09-04 DIAGNOSIS — J329 Chronic sinusitis, unspecified: Secondary | ICD-10-CM | POA: Diagnosis present

## 2022-09-04 DIAGNOSIS — I3139 Other pericardial effusion (noninflammatory): Secondary | ICD-10-CM | POA: Diagnosis present

## 2022-09-04 DIAGNOSIS — J90 Pleural effusion, not elsewhere classified: Secondary | ICD-10-CM | POA: Diagnosis not present

## 2022-09-04 DIAGNOSIS — N390 Urinary tract infection, site not specified: Secondary | ICD-10-CM

## 2022-09-04 DIAGNOSIS — I447 Left bundle-branch block, unspecified: Secondary | ICD-10-CM | POA: Diagnosis present

## 2022-09-04 DIAGNOSIS — I714 Abdominal aortic aneurysm, without rupture, unspecified: Secondary | ICD-10-CM | POA: Diagnosis present

## 2022-09-04 DIAGNOSIS — I4819 Other persistent atrial fibrillation: Secondary | ICD-10-CM | POA: Diagnosis present

## 2022-09-04 DIAGNOSIS — Z888 Allergy status to other drugs, medicaments and biological substances status: Secondary | ICD-10-CM

## 2022-09-04 DIAGNOSIS — M7989 Other specified soft tissue disorders: Secondary | ICD-10-CM | POA: Diagnosis not present

## 2022-09-04 DIAGNOSIS — S0993XA Unspecified injury of face, initial encounter: Secondary | ICD-10-CM | POA: Diagnosis not present

## 2022-09-04 DIAGNOSIS — I723 Aneurysm of iliac artery: Secondary | ICD-10-CM | POA: Diagnosis present

## 2022-09-04 DIAGNOSIS — E785 Hyperlipidemia, unspecified: Secondary | ICD-10-CM | POA: Diagnosis present

## 2022-09-04 DIAGNOSIS — Z043 Encounter for examination and observation following other accident: Secondary | ICD-10-CM | POA: Diagnosis not present

## 2022-09-04 DIAGNOSIS — Z5309 Procedure and treatment not carried out because of other contraindication: Secondary | ICD-10-CM | POA: Diagnosis not present

## 2022-09-04 DIAGNOSIS — G2581 Restless legs syndrome: Secondary | ICD-10-CM | POA: Diagnosis present

## 2022-09-04 DIAGNOSIS — E43 Unspecified severe protein-calorie malnutrition: Secondary | ICD-10-CM | POA: Diagnosis present

## 2022-09-04 DIAGNOSIS — R278 Other lack of coordination: Secondary | ICD-10-CM | POA: Diagnosis not present

## 2022-09-04 DIAGNOSIS — S3289XA Fracture of other parts of pelvis, initial encounter for closed fracture: Secondary | ICD-10-CM | POA: Diagnosis not present

## 2022-09-04 DIAGNOSIS — S0003XA Contusion of scalp, initial encounter: Secondary | ICD-10-CM | POA: Diagnosis not present

## 2022-09-04 DIAGNOSIS — Z79890 Hormone replacement therapy (postmenopausal): Secondary | ICD-10-CM | POA: Diagnosis not present

## 2022-09-04 DIAGNOSIS — M8580 Other specified disorders of bone density and structure, unspecified site: Secondary | ICD-10-CM | POA: Diagnosis present

## 2022-09-04 DIAGNOSIS — M6281 Muscle weakness (generalized): Secondary | ICD-10-CM | POA: Diagnosis not present

## 2022-09-04 DIAGNOSIS — D509 Iron deficiency anemia, unspecified: Secondary | ICD-10-CM | POA: Diagnosis present

## 2022-09-04 DIAGNOSIS — W19XXXA Unspecified fall, initial encounter: Secondary | ICD-10-CM | POA: Diagnosis not present

## 2022-09-04 DIAGNOSIS — I10 Essential (primary) hypertension: Secondary | ICD-10-CM | POA: Diagnosis not present

## 2022-09-04 DIAGNOSIS — M1990 Unspecified osteoarthritis, unspecified site: Secondary | ICD-10-CM | POA: Diagnosis not present

## 2022-09-04 DIAGNOSIS — Z885 Allergy status to narcotic agent status: Secondary | ICD-10-CM

## 2022-09-04 DIAGNOSIS — W1830XA Fall on same level, unspecified, initial encounter: Secondary | ICD-10-CM | POA: Diagnosis present

## 2022-09-04 DIAGNOSIS — K2270 Barrett's esophagus without dysplasia: Secondary | ICD-10-CM | POA: Diagnosis present

## 2022-09-04 DIAGNOSIS — J9811 Atelectasis: Secondary | ICD-10-CM | POA: Diagnosis not present

## 2022-09-04 DIAGNOSIS — Z90710 Acquired absence of both cervix and uterus: Secondary | ICD-10-CM

## 2022-09-04 DIAGNOSIS — I130 Hypertensive heart and chronic kidney disease with heart failure and stage 1 through stage 4 chronic kidney disease, or unspecified chronic kidney disease: Secondary | ICD-10-CM | POA: Diagnosis present

## 2022-09-04 DIAGNOSIS — Z66 Do not resuscitate: Secondary | ICD-10-CM | POA: Diagnosis present

## 2022-09-04 DIAGNOSIS — K219 Gastro-esophageal reflux disease without esophagitis: Secondary | ICD-10-CM | POA: Diagnosis present

## 2022-09-04 DIAGNOSIS — R41841 Cognitive communication deficit: Secondary | ICD-10-CM | POA: Diagnosis not present

## 2022-09-04 DIAGNOSIS — R2681 Unsteadiness on feet: Secondary | ICD-10-CM | POA: Diagnosis not present

## 2022-09-04 DIAGNOSIS — Y92009 Unspecified place in unspecified non-institutional (private) residence as the place of occurrence of the external cause: Secondary | ICD-10-CM

## 2022-09-04 DIAGNOSIS — Z9181 History of falling: Secondary | ICD-10-CM

## 2022-09-04 DIAGNOSIS — N3000 Acute cystitis without hematuria: Secondary | ICD-10-CM | POA: Diagnosis not present

## 2022-09-04 DIAGNOSIS — Z91040 Latex allergy status: Secondary | ICD-10-CM

## 2022-09-04 DIAGNOSIS — Z7401 Bed confinement status: Secondary | ICD-10-CM | POA: Diagnosis not present

## 2022-09-04 DIAGNOSIS — I700 Atherosclerosis of aorta: Secondary | ICD-10-CM | POA: Diagnosis present

## 2022-09-04 DIAGNOSIS — R4182 Altered mental status, unspecified: Secondary | ICD-10-CM | POA: Diagnosis not present

## 2022-09-04 DIAGNOSIS — Z9049 Acquired absence of other specified parts of digestive tract: Secondary | ICD-10-CM

## 2022-09-04 DIAGNOSIS — D631 Anemia in chronic kidney disease: Secondary | ICD-10-CM | POA: Diagnosis present

## 2022-09-04 DIAGNOSIS — I5022 Chronic systolic (congestive) heart failure: Secondary | ICD-10-CM | POA: Diagnosis not present

## 2022-09-04 DIAGNOSIS — Z882 Allergy status to sulfonamides status: Secondary | ICD-10-CM

## 2022-09-04 DIAGNOSIS — R296 Repeated falls: Secondary | ICD-10-CM | POA: Diagnosis not present

## 2022-09-04 DIAGNOSIS — K5730 Diverticulosis of large intestine without perforation or abscess without bleeding: Secondary | ICD-10-CM | POA: Diagnosis not present

## 2022-09-04 DIAGNOSIS — I4891 Unspecified atrial fibrillation: Secondary | ICD-10-CM | POA: Diagnosis not present

## 2022-09-04 DIAGNOSIS — F419 Anxiety disorder, unspecified: Secondary | ICD-10-CM | POA: Diagnosis not present

## 2022-09-04 DIAGNOSIS — E039 Hypothyroidism, unspecified: Secondary | ICD-10-CM | POA: Diagnosis present

## 2022-09-04 DIAGNOSIS — G9341 Metabolic encephalopathy: Secondary | ICD-10-CM | POA: Diagnosis present

## 2022-09-04 DIAGNOSIS — F331 Major depressive disorder, recurrent, moderate: Secondary | ICD-10-CM | POA: Diagnosis not present

## 2022-09-04 DIAGNOSIS — Z95810 Presence of automatic (implantable) cardiac defibrillator: Secondary | ICD-10-CM

## 2022-09-04 DIAGNOSIS — F32A Depression, unspecified: Secondary | ICD-10-CM | POA: Diagnosis present

## 2022-09-04 DIAGNOSIS — N1831 Chronic kidney disease, stage 3a: Secondary | ICD-10-CM | POA: Diagnosis present

## 2022-09-04 DIAGNOSIS — Z7901 Long term (current) use of anticoagulants: Secondary | ICD-10-CM

## 2022-09-04 DIAGNOSIS — R2689 Other abnormalities of gait and mobility: Secondary | ICD-10-CM | POA: Diagnosis not present

## 2022-09-04 DIAGNOSIS — G4700 Insomnia, unspecified: Secondary | ICD-10-CM | POA: Diagnosis not present

## 2022-09-04 DIAGNOSIS — Z881 Allergy status to other antibiotic agents status: Secondary | ICD-10-CM

## 2022-09-04 DIAGNOSIS — Z8719 Personal history of other diseases of the digestive system: Secondary | ICD-10-CM

## 2022-09-04 DIAGNOSIS — Z9109 Other allergy status, other than to drugs and biological substances: Secondary | ICD-10-CM

## 2022-09-04 DIAGNOSIS — G9340 Encephalopathy, unspecified: Secondary | ICD-10-CM | POA: Diagnosis not present

## 2022-09-04 DIAGNOSIS — I255 Ischemic cardiomyopathy: Secondary | ICD-10-CM | POA: Diagnosis present

## 2022-09-04 HISTORY — DX: Urinary tract infection, site not specified: N39.0

## 2022-09-04 HISTORY — DX: Aneurysm of iliac artery: I72.3

## 2022-09-04 HISTORY — DX: Chronic systolic (congestive) heart failure: I50.22

## 2022-09-04 NOTE — ED Triage Notes (Signed)
Pt BIB EMS from home, neighbor called EMS due to loud thud. Pt found on the floor after a fall. Pt has hx of blood thinners. States she has been having bilateral weakness in legs x 1 year, getting worse recently. No LOC.

## 2022-09-04 NOTE — ED Provider Notes (Signed)
[facility] AT [facility] Provider Note   CSN: [id number] Arrival date & time: 09/04/22  2323     History {Add pertinent medical, surgical, social history, OB history to HPI:1} Chief Complaint  Patient presents with   [name] is a 89 y.o. female.  The history is provided by the patient and medical records.  Fall   89 year old female with history of hypothyroidism, GERD, paroxysmal A-fib on Xarelto, depression, cardiomyopathy, presenting to the ED as a level 2 fall on thinners.  Patient currently lives alone, however fell tonight on the floor, struck the back of her head.  There was no reported loss of consciousness.  Patient found by neighbor as they were startled by loud noise when she fell.  Patient reports she has been getting progressively more weak over the past several months, has been a lot worse here recently.  States to the point now she is not really able to get to her doctors appointments so has been canceling them.  States she has been falling quite frequently recently.  Also reportedly has had some urinary symptoms.  Sample was given at doctor's office, however this was misplaced and was never run.  Home Medications Prior to Admission medications   Medication Sig Start Date End Date Taking? Authorizing Provider  acetaminophen (TYLENOL) 500 MG tablet Take 1,000 mg by mouth every 8 (eight) hours as needed for moderate pain or mild pain.    [provider]  Ascorbic Acid (VITAMIN C) 1000 MG tablet Take 1,000 mg by mouth at bedtime. Ester C    [provider]  Biotin 5000 MCG TABS Take 5,000 mcg by mouth daily.    [provider]  Calcium Carbonate-Vitamin D (CALCIUM + D PO) Take 2 tablets by mouth daily. 1000 units Vitamin D 1200 mg calcium    [provider]  Cyanocobalamin (VITAMIN B 12) 500 MCG TABS Take 1,000 mcg by mouth daily.     [provider]  estradiol (ESTRACE) 0.1 MG/GM  vaginal cream Place 1 g vaginally 2 (two) times a week. Patient not taking: Reported on 02/03/2022 06/13/18   [provider]  fexofenadine (ALLEGRA) 180 MG tablet Take 180 mg by mouth daily.    [provider]  levothyroxine (SYNTHROID) 100 MCG tablet Take 1 tablet (100 mcg total) by mouth daily before breakfast. E03.9 08/17/22   Frederica Kuster, MD  losartan (COZAAR) 25 MG tablet TAKE 1 TABLET BY MOUTH EVERY DAY 08/17/22   [place], Peter M, MD  metoprolol succinate (TOPROL XL) 25 MG 24 hr tablet Take 0.5 tablets (12.5 mg total) by mouth daily. 09/02/21   Sheilah Pigeon, PA-C  Polyethyl Glycol-Propyl Glycol (SYSTANE) 0.4-0.3 % SOLN Place 1 drop into both eyes daily as needed (dry eyes).    [provider]  RABEprazole (ACIPHEX) 20 MG tablet Take one tablet by mouth once daily for stomach 03/12/22   Frederica Kuster, MD  Rivaroxaban (XARELTO) 15 MG TABS tablet TAKE 1 TABLET DAILY WITH   SUPPER (DOSE REDUCED) 03/12/22   [place], Peter M, MD      Allergies    Hydrocodone, Cephalexin, Ciprofloxacin, Doxycycline, Escitalopram, Levofloxacin, Moxifloxacin, Ofloxacin, Sertraline, Sulfamethoxazole, Tape, Latex, and Neomycin    Review of Systems   Review of Systems  Musculoskeletal:  Positive for arthralgias.  All other systems reviewed and are negative.   Physical Exam Updated Vital Signs SpO2 95%  Physical Exam Vitals and nursing note reviewed.  Constitutional:      Appearance: She is well-developed.     Comments: Elderly, thin, frail  HENT:     Head: Normocephalic and atraumatic.     Comments: Contusion occipital scalp, no laceration present Eyes:     Conjunctiva/sclera: Conjunctivae normal.     Pupils: Pupils are equal, round, and reactive to light.  Cardiovascular:     Rate and Rhythm: Normal rate and regular rhythm.     Heart sounds: Normal heart sounds.  Pulmonary:     Effort: Pulmonary effort is normal.     Breath sounds: Normal breath sounds.  Abdominal:      General: Bowel sounds are normal.     Palpations: Abdomen is soft.  Musculoskeletal:        General: Normal range of motion.     Cervical back: Normal range of motion.     Comments: Pelvis stable, no leg shortening  Skin:    General: Skin is warm and dry.  Neurological:     Mental Status: She is alert and oriented to person, place, and time.     Comments: AAOx3, answering questions and following commands     ED Results / Procedures / Treatments   Labs (all labs ordered are listed, but only abnormal results are displayed) Labs Reviewed  CBC WITH DIFFERENTIAL/PLATELET  COMPREHENSIVE METABOLIC PANEL  URINALYSIS, W/ REFLEX TO CULTURE (INFECTION SUSPECTED)    EKG None  Radiology No results found.  Procedures Procedures  {Document cardiac monitor, telemetry assessment procedure when appropriate:1}  Medications Ordered in ED Medications - No data to display  ED Course/ Medical Decision Making/ A&P   {   Click here for ABCD2, HEART and other calculatorsREFRESH Note before signing :1}                          Medical Decision Making Amount and/or Complexity of Data Reviewed Labs: ordered. Radiology: ordered. ECG/medicine tests: ordered.   ***  {Document critical care time when appropriate:1} {Document review of labs and clinical decision tools ie heart score, Chads2Vasc2 etc:1}  {Document your independent review of radiology images, and any outside records:1} {Document your discussion with family members, caretakers, and with consultants:1} {Document social determinants of health affecting pt's care:1} {Document your decision making why or why not admission, treatments were needed:1} Final Clinical Impression(s) / ED Diagnoses Final diagnoses:  None    Rx / DC Orders ED Discharge Orders     None

## 2022-09-04 NOTE — Progress Notes (Signed)
   09/04/22 2352  Spiritual Encounters  Type of Visit Initial  Care provided to: Patient  Conversation partners present during encounter Nurse  Referral source Trauma page  Reason for visit Trauma  OnCall Visit Yes   Chaplain responding to Trauma 2 page, Fall on Thinners.  PT arrived in room and was initially unavailable as medical team provided care.  Mirna Mires was eventually able to speak with PT and ask if any family or support persons were on their way.  PT advised that she has no nearby family and she told her friends not to come as they have the flu.Provided compassionate presence to PT and will continue to check in with her through the night.

## 2022-09-05 ENCOUNTER — Observation Stay (HOSPITAL_COMMUNITY): Payer: Medicare Other

## 2022-09-05 ENCOUNTER — Emergency Department (HOSPITAL_COMMUNITY): Payer: Medicare Other

## 2022-09-05 ENCOUNTER — Encounter (HOSPITAL_COMMUNITY): Payer: Self-pay | Admitting: Family Medicine

## 2022-09-05 DIAGNOSIS — R296 Repeated falls: Secondary | ICD-10-CM | POA: Diagnosis not present

## 2022-09-05 DIAGNOSIS — N390 Urinary tract infection, site not specified: Secondary | ICD-10-CM

## 2022-09-05 DIAGNOSIS — Z66 Do not resuscitate: Secondary | ICD-10-CM | POA: Diagnosis present

## 2022-09-05 DIAGNOSIS — E785 Hyperlipidemia, unspecified: Secondary | ICD-10-CM | POA: Diagnosis present

## 2022-09-05 DIAGNOSIS — E46 Unspecified protein-calorie malnutrition: Secondary | ICD-10-CM | POA: Diagnosis not present

## 2022-09-05 DIAGNOSIS — K219 Gastro-esophageal reflux disease without esophagitis: Secondary | ICD-10-CM | POA: Diagnosis not present

## 2022-09-05 DIAGNOSIS — R278 Other lack of coordination: Secondary | ICD-10-CM | POA: Diagnosis not present

## 2022-09-05 DIAGNOSIS — I700 Atherosclerosis of aorta: Secondary | ICD-10-CM | POA: Diagnosis present

## 2022-09-05 DIAGNOSIS — K5730 Diverticulosis of large intestine without perforation or abscess without bleeding: Secondary | ICD-10-CM | POA: Diagnosis not present

## 2022-09-05 DIAGNOSIS — I340 Nonrheumatic mitral (valve) insufficiency: Secondary | ICD-10-CM | POA: Diagnosis present

## 2022-09-05 DIAGNOSIS — J4489 Other specified chronic obstructive pulmonary disease: Secondary | ICD-10-CM | POA: Diagnosis present

## 2022-09-05 DIAGNOSIS — I428 Other cardiomyopathies: Secondary | ICD-10-CM | POA: Diagnosis present

## 2022-09-05 DIAGNOSIS — I4819 Other persistent atrial fibrillation: Secondary | ICD-10-CM | POA: Diagnosis present

## 2022-09-05 DIAGNOSIS — G9340 Encephalopathy, unspecified: Secondary | ICD-10-CM | POA: Diagnosis not present

## 2022-09-05 DIAGNOSIS — E43 Unspecified severe protein-calorie malnutrition: Secondary | ICD-10-CM | POA: Diagnosis present

## 2022-09-05 DIAGNOSIS — G4700 Insomnia, unspecified: Secondary | ICD-10-CM | POA: Diagnosis not present

## 2022-09-05 DIAGNOSIS — I3139 Other pericardial effusion (noninflammatory): Secondary | ICD-10-CM | POA: Diagnosis present

## 2022-09-05 DIAGNOSIS — N3000 Acute cystitis without hematuria: Secondary | ICD-10-CM | POA: Diagnosis not present

## 2022-09-05 DIAGNOSIS — I130 Hypertensive heart and chronic kidney disease with heart failure and stage 1 through stage 4 chronic kidney disease, or unspecified chronic kidney disease: Secondary | ICD-10-CM | POA: Diagnosis present

## 2022-09-05 DIAGNOSIS — G2581 Restless legs syndrome: Secondary | ICD-10-CM | POA: Diagnosis present

## 2022-09-05 DIAGNOSIS — W19XXXA Unspecified fall, initial encounter: Secondary | ICD-10-CM | POA: Diagnosis not present

## 2022-09-05 DIAGNOSIS — I4891 Unspecified atrial fibrillation: Secondary | ICD-10-CM

## 2022-09-05 DIAGNOSIS — Z5309 Procedure and treatment not carried out because of other contraindication: Secondary | ICD-10-CM

## 2022-09-05 DIAGNOSIS — J9811 Atelectasis: Secondary | ICD-10-CM | POA: Diagnosis not present

## 2022-09-05 DIAGNOSIS — S199XXA Unspecified injury of neck, initial encounter: Secondary | ICD-10-CM | POA: Diagnosis not present

## 2022-09-05 DIAGNOSIS — S0993XA Unspecified injury of face, initial encounter: Secondary | ICD-10-CM | POA: Diagnosis not present

## 2022-09-05 DIAGNOSIS — M6281 Muscle weakness (generalized): Secondary | ICD-10-CM | POA: Diagnosis not present

## 2022-09-05 DIAGNOSIS — N1831 Chronic kidney disease, stage 3a: Secondary | ICD-10-CM | POA: Diagnosis present

## 2022-09-05 DIAGNOSIS — J90 Pleural effusion, not elsewhere classified: Secondary | ICD-10-CM | POA: Diagnosis not present

## 2022-09-05 DIAGNOSIS — F419 Anxiety disorder, unspecified: Secondary | ICD-10-CM | POA: Diagnosis not present

## 2022-09-05 DIAGNOSIS — D631 Anemia in chronic kidney disease: Secondary | ICD-10-CM | POA: Diagnosis present

## 2022-09-05 DIAGNOSIS — R4182 Altered mental status, unspecified: Secondary | ICD-10-CM | POA: Diagnosis not present

## 2022-09-05 DIAGNOSIS — G9341 Metabolic encephalopathy: Secondary | ICD-10-CM | POA: Diagnosis present

## 2022-09-05 DIAGNOSIS — E039 Hypothyroidism, unspecified: Secondary | ICD-10-CM | POA: Diagnosis present

## 2022-09-05 DIAGNOSIS — F331 Major depressive disorder, recurrent, moderate: Secondary | ICD-10-CM | POA: Diagnosis not present

## 2022-09-05 DIAGNOSIS — M1990 Unspecified osteoarthritis, unspecified site: Secondary | ICD-10-CM | POA: Diagnosis not present

## 2022-09-05 DIAGNOSIS — Z681 Body mass index (BMI) 19 or less, adult: Secondary | ICD-10-CM | POA: Diagnosis not present

## 2022-09-05 DIAGNOSIS — Z7401 Bed confinement status: Secondary | ICD-10-CM | POA: Diagnosis not present

## 2022-09-05 DIAGNOSIS — Z79890 Hormone replacement therapy (postmenopausal): Secondary | ICD-10-CM | POA: Diagnosis not present

## 2022-09-05 DIAGNOSIS — F32A Depression, unspecified: Secondary | ICD-10-CM | POA: Diagnosis present

## 2022-09-05 DIAGNOSIS — R531 Weakness: Secondary | ICD-10-CM

## 2022-09-05 DIAGNOSIS — M7989 Other specified soft tissue disorders: Secondary | ICD-10-CM | POA: Diagnosis not present

## 2022-09-05 DIAGNOSIS — Y92009 Unspecified place in unspecified non-institutional (private) residence as the place of occurrence of the external cause: Secondary | ICD-10-CM | POA: Diagnosis not present

## 2022-09-05 DIAGNOSIS — Z85820 Personal history of malignant melanoma of skin: Secondary | ICD-10-CM | POA: Diagnosis not present

## 2022-09-05 DIAGNOSIS — R188 Other ascites: Secondary | ICD-10-CM | POA: Diagnosis present

## 2022-09-05 DIAGNOSIS — R2689 Other abnormalities of gait and mobility: Secondary | ICD-10-CM | POA: Diagnosis not present

## 2022-09-05 DIAGNOSIS — R2681 Unsteadiness on feet: Secondary | ICD-10-CM | POA: Diagnosis not present

## 2022-09-05 DIAGNOSIS — Z87891 Personal history of nicotine dependence: Secondary | ICD-10-CM | POA: Diagnosis not present

## 2022-09-05 DIAGNOSIS — I5022 Chronic systolic (congestive) heart failure: Secondary | ICD-10-CM | POA: Diagnosis present

## 2022-09-05 DIAGNOSIS — I10 Essential (primary) hypertension: Secondary | ICD-10-CM | POA: Diagnosis not present

## 2022-09-05 DIAGNOSIS — W1830XA Fall on same level, unspecified, initial encounter: Secondary | ICD-10-CM | POA: Diagnosis present

## 2022-09-05 DIAGNOSIS — S0003XA Contusion of scalp, initial encounter: Secondary | ICD-10-CM | POA: Diagnosis not present

## 2022-09-05 DIAGNOSIS — D509 Iron deficiency anemia, unspecified: Secondary | ICD-10-CM | POA: Diagnosis present

## 2022-09-05 DIAGNOSIS — R41841 Cognitive communication deficit: Secondary | ICD-10-CM | POA: Diagnosis not present

## 2022-09-05 DIAGNOSIS — Z043 Encounter for examination and observation following other accident: Secondary | ICD-10-CM | POA: Diagnosis not present

## 2022-09-05 LAB — BASIC METABOLIC PANEL
Anion gap: 10 (ref 5–15)
BUN: 33 mg/dL — ABNORMAL HIGH (ref 8–23)
CO2: 17 mmol/L — ABNORMAL LOW (ref 22–32)
Calcium: 8.7 mg/dL — ABNORMAL LOW (ref 8.9–10.3)
Chloride: 108 mmol/L (ref 98–111)
Creatinine, Ser: 1.1 mg/dL — ABNORMAL HIGH (ref 0.44–1.00)
GFR, Estimated: 49 mL/min — ABNORMAL LOW (ref >60)
Glucose, Bld: 119 mg/dL — ABNORMAL HIGH (ref 70–99)
Potassium: 3.8 mmol/L (ref 3.5–5.1)
Sodium: 135 mmol/L (ref 135–145)

## 2022-09-05 LAB — COMPREHENSIVE METABOLIC PANEL
ALT: 61 U/L — ABNORMAL HIGH (ref 0–44)
AST: 58 U/L — ABNORMAL HIGH (ref 15–41)
Albumin: 3.6 g/dL (ref 3.5–5.0)
Alkaline Phosphatase: 110 U/L (ref 38–126)
Anion gap: 11 (ref 5–15)
BUN: 34 mg/dL — ABNORMAL HIGH (ref 8–23)
CO2: 16 mmol/L — ABNORMAL LOW (ref 22–32)
Calcium: 8.9 mg/dL (ref 8.9–10.3)
Chloride: 107 mmol/L (ref 98–111)
Creatinine, Ser: 1.13 mg/dL — ABNORMAL HIGH (ref 0.44–1.00)
GFR, Estimated: 47 mL/min — ABNORMAL LOW (ref >60)
Glucose, Bld: 130 mg/dL — ABNORMAL HIGH (ref 70–99)
Potassium: 4 mmol/L (ref 3.5–5.1)
Sodium: 134 mmol/L — ABNORMAL LOW (ref 135–145)
Total Bilirubin: 1.4 mg/dL — ABNORMAL HIGH (ref 0.3–1.2)
Total Protein: 5.9 g/dL — ABNORMAL LOW (ref 6.5–8.1)

## 2022-09-05 LAB — CBC WITH DIFFERENTIAL/PLATELET
Abs Immature Granulocytes: 0.03 10*3/uL (ref 0.00–0.07)
Abs Immature Granulocytes: 0.03 K/uL (ref 0.00–0.07)
Basophils Absolute: 0 10*3/uL (ref 0.0–0.1)
Basophils Absolute: 0 K/uL (ref 0.0–0.1)
Basophils Relative: 0 %
Basophils Relative: 0 %
Eosinophils Absolute: 0 10*3/uL (ref 0.0–0.5)
Eosinophils Absolute: 0 K/uL (ref 0.0–0.5)
Eosinophils Relative: 0 %
Eosinophils Relative: 0 %
HCT: 31.9 % — ABNORMAL LOW (ref 36.0–46.0)
HCT: 33.9 % — ABNORMAL LOW (ref 36.0–46.0)
Hemoglobin: 10.3 g/dL — ABNORMAL LOW (ref 12.0–15.0)
Hemoglobin: 10.6 g/dL — ABNORMAL LOW (ref 12.0–15.0)
Immature Granulocytes: 0 %
Immature Granulocytes: 0 %
Lymphocytes Relative: 10 %
Lymphocytes Relative: 12 %
Lymphs Abs: 0.9 10*3/uL (ref 0.7–4.0)
Lymphs Abs: 1 K/uL (ref 0.7–4.0)
MCH: 29.4 pg (ref 26.0–34.0)
MCH: 29.9 pg (ref 26.0–34.0)
MCHC: 31.3 g/dL (ref 30.0–36.0)
MCHC: 32.3 g/dL (ref 30.0–36.0)
MCV: 92.7 fL (ref 80.0–100.0)
MCV: 94.2 fL (ref 80.0–100.0)
Monocytes Absolute: 0.8 K/uL (ref 0.1–1.0)
Monocytes Absolute: 0.9 10*3/uL (ref 0.1–1.0)
Monocytes Relative: 10 %
Monocytes Relative: 11 %
Neutro Abs: 6.5 10*3/uL (ref 1.7–7.7)
Neutro Abs: 6.7 K/uL (ref 1.7–7.7)
Neutrophils Relative %: 78 %
Neutrophils Relative %: 79 %
Platelets: 229 K/uL (ref 150–400)
Platelets: 247 10*3/uL (ref 150–400)
RBC: 3.44 MIL/uL — ABNORMAL LOW (ref 3.87–5.11)
RBC: 3.6 MIL/uL — ABNORMAL LOW (ref 3.87–5.11)
RDW: 15.1 % (ref 11.5–15.5)
RDW: 15.2 % (ref 11.5–15.5)
WBC: 8.4 10*3/uL (ref 4.0–10.5)
WBC: 8.6 K/uL (ref 4.0–10.5)
nRBC: 0 % (ref 0.0–0.2)
nRBC: 0 % (ref 0.0–0.2)

## 2022-09-05 LAB — URINALYSIS, W/ REFLEX TO CULTURE (INFECTION SUSPECTED)
Bilirubin Urine: NEGATIVE
Glucose, UA: NEGATIVE mg/dL
Hgb urine dipstick: NEGATIVE
Ketones, ur: 5 mg/dL — AB
Nitrite: NEGATIVE
Protein, ur: 100 mg/dL — AB
Specific Gravity, Urine: 1.02 (ref 1.005–1.030)
pH: 5 (ref 5.0–8.0)

## 2022-09-05 LAB — CULTURE, BLOOD (ROUTINE X 2)
Culture: NO GROWTH
Culture: NO GROWTH

## 2022-09-05 LAB — GLUCOSE, CAPILLARY
Glucose-Capillary: 91 mg/dL (ref 70–99)
Glucose-Capillary: 99 mg/dL (ref 70–99)
Glucose-Capillary: 132 mg/dL — ABNORMAL HIGH (ref 70–99)
Glucose-Capillary: 116 mg/dL — ABNORMAL HIGH (ref 70–99)

## 2022-09-05 LAB — TSH: TSH: 2.716 u[IU]/mL (ref 0.350–4.500)

## 2022-09-05 LAB — MAGNESIUM
Magnesium: 2.1 mg/dL (ref 1.7–2.4)
Magnesium: 2.1 mg/dL (ref 1.7–2.4)

## 2022-09-05 MED ORDER — CARMEX CLASSIC LIP BALM EX OINT
1.0000 | TOPICAL_OINTMENT | CUTANEOUS | Status: DC | PRN
  Administered 2022-09-05: 1 via TOPICAL
  Filled 2022-09-05: qty 7.5

## 2022-09-05 MED ORDER — ENSURE ENLIVE PO LIQD
237.0000 mL | Freq: Two times a day (BID) | ORAL | Status: DC
  Administered 2022-09-05 – 2022-09-10 (×9): 237 mL via ORAL

## 2022-09-05 MED ORDER — SODIUM CHLORIDE 0.9 % IV SOLN
1.0000 g | INTRAVENOUS | Status: AC
  Administered 2022-09-05 – 2022-09-09 (×5): 1 g via INTRAVENOUS
  Filled 2022-09-05 (×5): qty 10

## 2022-09-05 MED ORDER — HYDROMORPHONE HCL 1 MG/ML IJ SOLN
0.2000 mg | INTRAMUSCULAR | Status: DC | PRN
  Filled 2022-09-05: qty 1

## 2022-09-05 MED ORDER — HYDROMORPHONE HCL 1 MG/ML IJ SOLN
0.5000 mg | Freq: Once | INTRAMUSCULAR | Status: AC
  Administered 2022-09-05: 0.5 mg via INTRAVENOUS
  Filled 2022-09-05: qty 1

## 2022-09-05 MED ORDER — DEXTROSE-SODIUM CHLORIDE 5-0.9 % IV SOLN
INTRAVENOUS | Status: DC
  Administered 2022-09-05: 100 mL/h via INTRAVENOUS

## 2022-09-05 MED ORDER — ORAL CARE MOUTH RINSE: 15.0000 mL | OROMUCOSAL | Status: DC | PRN

## 2022-09-05 MED ORDER — RIVAROXABAN 15 MG PO TABS: 15.0000 mg | ORAL_TABLET | Freq: Every day | ORAL | Status: DC

## 2022-09-05 MED ORDER — METOPROLOL TARTRATE 25 MG PO TABS
25.0000 mg | ORAL_TABLET | Freq: Once | ORAL | Status: AC
  Administered 2022-09-05: 25 mg via ORAL
  Filled 2022-09-05: qty 1

## 2022-09-05 MED ORDER — SODIUM CHLORIDE 0.9 % IV SOLN: INTRAVENOUS | Status: AC

## 2022-09-05 MED ORDER — PIPERACILLIN-TAZOBACTAM 3.375 G IVPB 30 MIN
3.3750 g | Freq: Once | INTRAVENOUS | Status: AC
  Administered 2022-09-05: 3.375 g via INTRAVENOUS
  Filled 2022-09-05: qty 50

## 2022-09-05 MED ORDER — METOPROLOL TARTRATE 5 MG/5ML IV SOLN: 5.0000 mg | INTRAVENOUS | Status: DC | PRN

## 2022-09-05 MED ORDER — PIPERACILLIN-TAZOBACTAM 3.375 G IVPB
3.3750 g | Freq: Three times a day (TID) | INTRAVENOUS | Status: DC
  Administered 2022-09-05: 3.375 g via INTRAVENOUS
  Filled 2022-09-05 (×2): qty 50

## 2022-09-05 MED ORDER — SENNOSIDES-DOCUSATE SODIUM 8.6-50 MG PO TABS: 1.0000 | ORAL_TABLET | Freq: Every evening | ORAL | Status: DC | PRN

## 2022-09-05 MED ORDER — METOPROLOL SUCCINATE ER 25 MG PO TB24
25.0000 mg | ORAL_TABLET | Freq: Every day | ORAL | Status: DC
  Administered 2022-09-05: 25 mg via ORAL
  Filled 2022-09-05: qty 1

## 2022-09-05 MED ORDER — METOPROLOL TARTRATE 5 MG/5ML IV SOLN
2.5000 mg | Freq: Three times a day (TID) | INTRAVENOUS | Status: DC
  Administered 2022-09-05 – 2022-09-06 (×3): 2.5 mg via INTRAVENOUS
  Filled 2022-09-05 (×3): qty 5

## 2022-09-05 MED ORDER — LEVOTHYROXINE SODIUM 100 MCG PO TABS
100.0000 ug | ORAL_TABLET | Freq: Every day | ORAL | Status: DC
  Administered 2022-09-05 – 2022-09-10 (×6): 100 ug via ORAL
  Filled 2022-09-05 (×6): qty 1

## 2022-09-05 MED ORDER — PANTOPRAZOLE SODIUM 40 MG PO TBEC
40.0000 mg | DELAYED_RELEASE_TABLET | Freq: Every day | ORAL | Status: DC
  Administered 2022-09-05 – 2022-09-10 (×6): 40 mg via ORAL
  Filled 2022-09-05 (×6): qty 1

## 2022-09-05 NOTE — H&P (Addendum)
PCP:   Frederica Kuster, MD   Chief Complaint:  Frequent falls  HPI: This is a 89 year old female with past medical history of cardiomyopathy, underlying BiV/ICD, HTN,  hypothyroidism melanoma, anxiety/depression and chronic systolic HF.  The patient lives alone.  She presents to the ER because she has been falling with increasing frequency since February and hurting yourself.  Her last fall was last night, she hit her head loud enough that her neighbors heard.  She did not lose consciousness.  She states her legs simply gave out.  She denies chest pain or pressure.  She denies shortness of breath or cough.  She endorses mild palpitation.  She denies fever, chills, nausea vomiting or diarrhea.  She does endorse mild burning urination over the last several months.  She believes that she had a UTI for several months but has not gone to the doctor.  Due to her falling she seldom leaves her house or drive, and for this reason she has not been to the doctor to check on the possible UTI.  And she missed her 56-month check for ICD.  In the ER her UA suggestive of UTI.  Her ICD was interrogated and between yesterday and today she has had mid episodes of tachycardia heart rate up to the 150s/160s.  Review of Systems:  Per HPI  Past Medical History: Past Medical History:  Diagnosis Date   AAA (abdominal aortic aneurysm) (HCC)    2.8 cm 05/2018; 5 year [place] per ACR guidelines   Allergy    all year    Anxiety disorder    Arthritis    Barrett's esophagus 04/2008   Benign neoplasm of colon    Blood transfusion without reported diagnosis    long ago per pt   Cataract    removed both eyes   CHF (congestive heart failure) (HCC)    Chronic airway obstruction, not elsewhere classified    Complication of anesthesia    slow to wake up   Depressive disorder, not elsewhere classified    Diverticulitis    Dysthymic disorder    Erosive gastritis 08/2004   GERD (gastroesophageal reflux disease)    Hair  thinning    HTN (hypertension)    pt denies    Hyperplastic polyps of stomach 06/2002   Hypothyroidism    ICD (implantable cardiac defibrillator) in place    BiV/ICD; s/p removal of ICD with insertion of BiV PPM 04/26/12   Incisional hernia    Insomnia 08/20/2015   Kyphosis 08/20/2015   LBBB (left bundle branch block)    Melanoma (HCC)    right arm   Mitral regurgitation    Nonischemic cardiomyopathy (HCC)    EF initially 20%; Last measurement up to 50% per echo in August of 2012   Osteopenia    Other and unspecified hyperlipidemia    Other and unspecified hyperlipidemia    Pacemaker 04/26/2012   PAF (paroxysmal atrial fibrillation) (HCC)    11/2016   Pain in joint, lower leg    Palpitations    Pneumonia    Restless legs syndrome (RLS)    Synovial cyst of popliteal space    Unspecified chronic bronchitis (HCC)    Unspecified nasal polyp    Unspecified sinusitis (chronic)    Past Surgical History:  Procedure Laterality Date   ABDOMINAL HYSTERECTOMY  1076   endometriosis   APPENDECTOMY     BI-VENTRICULAR PACEMAKER INSERTION N/A 04/26/2012   Procedure: BI-VENTRICULAR PACEMAKER INSERTION (CRT-P);  Surgeon: Marinus Maw,  MD;  Location: [facility] CATH LAB;  Service: Cardiovascular;  Laterality: N/A;   BIV PACEMAKER GENERATOR CHANGEOUT N/A 01/26/2019   Procedure: BIV PACEMAKER GENERATOR CHANGEOUT;  Surgeon: Marinus Maw, MD;  Location: [facility] INVASIVE CV LAB;  Service: Cardiovascular;  Laterality: N/A;   CARDIOVERSION N/A 05/06/2020   Procedure: CARDIOVERSION;  Surgeon: Chrystie Nose, MD;  Location: [facility] ENDOSCOPY;  Service: Cardiovascular;  Laterality: N/A;   CARDIOVERSION N/A 05/19/2020   Procedure: CARDIOVERSION;  Surgeon: Jake Bathe, MD;  Location: [facility] ENDOSCOPY;  Service: Cardiovascular;  Laterality: N/A;   COLONOSCOPY     defibrillator insertion     s/p removal of previously implanted BiV ICD and insertion of a new BiV pacemaker on 04/26/12   excise mole of lip  2001   Dr. Everlene Farrier    excision of wen  1984   Dr. Valrie Hart   FEMORAL HERNIA REPAIR  12/25/2012   Dr Derrell Lolling   FEMORAL HERNIA REPAIR  01/04/2013   Recurrent - Dr. Darin Engels HERNIA REPAIR N/A 10/01/2013   Procedure: LAPAROSCOPIC INCISIONAL HERNIA;  Surgeon: Atilano Ina, MD;  Location: [facility] ORS;  Service: General;  Laterality: N/A;   INCISIONAL HERNIA REPAIR N/A 09/07/2018   Procedure: LAPAROSCOPIC ASSISTED REPAIR OF INCISIONAL HERNIA;  Surgeon: Gaynelle Adu, MD;  Location: [facility] OR;  Service: General;  Laterality: N/A;   INGUINAL HERNIA REPAIR Right 09/26/2012   Procedure: RIGHT INGUINAL HERNIA REPAIR WITH MESH;  Surgeon: Adolph Pollack, MD;  Location: [facility] OR;  Service: General;  Laterality: Right;   INGUINAL HERNIA REPAIR Right 12/25/2012   Procedure: explor right groin, small bowel rescection, tissue repair right femoral hernia;  Surgeon: Ernestene Mention, MD;  Location: [facility] ORS;  Service: General;  Laterality: Right;   INSERTION OF MESH Right 09/26/2012   Procedure: INSERTION OF MESH;  Surgeon: Adolph Pollack, MD;  Location: [facility] OR;  Service: General;  Laterality: Right;   INSERTION OF MESH N/A 10/01/2013   Procedure: INSERTION OF MESH;  Surgeon: Atilano Ina, MD;  Location: [facility] ORS;  Service: General;  Laterality: N/A;   LAPAROSCOPY N/A 01/04/2013   Procedure: Diagnostic Laparoscopy, exploratory laparotomy with small bowel resection, closure of right femoral hernia repair;  Surgeon: Lodema Pilot, DO;  Location: [facility] ORS;  Service: General;  Laterality: N/A;   Left arm surgery     POLYPECTOMY     RIGHT BREAST LUMPECTOMY  1988   Dr. Francina Ames   right knee surgery     arthroscopy: Dr. Simonne Come   TONSILLECTOMY     UPPER GASTROINTESTINAL ENDOSCOPY      Medications: Prior to Admission medications   Medication Sig Start Date End Date Taking? Authorizing Provider  acetaminophen (TYLENOL) 500 MG tablet Take 1,000 mg by mouth every 8 (eight) hours as needed for moderate pain or mild pain.    [provider]  Ascorbic Acid (VITAMIN C) 1000 MG tablet Take 1,000 mg by mouth at bedtime. Ester C    [provider]  Biotin 5000 MCG TABS Take 5,000 mcg by mouth daily.    [provider]  Calcium Carbonate-Vitamin D (CALCIUM + D PO) Take 2 tablets by mouth daily. 1000 units Vitamin D 1200 mg calcium    [provider]  Cyanocobalamin (VITAMIN B 12) 500 MCG TABS Take 1,000 mcg by mouth daily.     [provider]  estradiol (ESTRACE) 0.1 MG/GM vaginal cream Place 1 g vaginally 2 (two) times a week. Patient not taking:  Reported on 02/03/2022 06/13/18   [provider]  fexofenadine (ALLEGRA) 180 MG tablet Take 180 mg by mouth daily.    [provider]  levothyroxine (SYNTHROID) 100 MCG tablet Take 1 tablet (100 mcg total) by mouth daily before breakfast. E03.9 08/17/22   Frederica Kuster, MD  losartan (COZAAR) 25 MG tablet TAKE 1 TABLET BY MOUTH EVERY DAY 08/17/22   [place], Peter M, MD  metoprolol succinate (TOPROL XL) 25 MG 24 hr tablet Take 0.5 tablets (12.5 mg total) by mouth daily. 09/02/21   Sheilah Pigeon, PA-C  Polyethyl Glycol-Propyl Glycol (SYSTANE) 0.4-0.3 % SOLN Place 1 drop into both eyes daily as needed (dry eyes).    [provider]  RABEprazole (ACIPHEX) 20 MG tablet Take one tablet by mouth once daily for stomach 03/12/22   Frederica Kuster, MD  Rivaroxaban (XARELTO) 15 MG TABS tablet TAKE 1 TABLET DAILY WITH   SUPPER (DOSE REDUCED) 03/12/22   [place], Peter M, MD    Allergies:   Allergies  Allergen Reactions   Hydrocodone Itching   Cephalexin Itching and Swelling    Other reaction(s): edema   Ciprofloxacin Other (See Comments)    Not indicated due to aneurysm in aorta     Doxycycline Other (See Comments)    Unknown reaction   Escitalopram Other (See Comments)    Dry mouth   Levofloxacin Other (See Comments)    Not indicated due to aneurysm in aorta    Moxifloxacin Other (See Comments)    Not indicated due to  aneurysm in aorta    Ofloxacin Other (See Comments)    Unknown reaction   Sertraline Other (See Comments)    insomnia Other reaction(s): insomnia   Sulfamethoxazole Hives    Any sulfa meds.   Tape Other (See Comments)    Burns skin.    Latex Rash    "If pt. Makes contact with or wearing"   Neomycin Rash    Social History:  reports that she quit smoking about 28 years ago. Her smoking use included cigarettes. She has a 15.00 pack-year smoking history. She has never used smokeless tobacco. She reports that she does not drink alcohol and does not use drugs.  Family History: Family History  Problem Relation Age of Onset   Stroke Father    Heart disease Other        maternal side   Colon cancer Paternal Uncle    Breast cancer Maternal Aunt    Colon cancer Cousin    Diabetes Neg Hx    Colon polyps Neg Hx    Rectal cancer Neg Hx    Stomach cancer Neg Hx     Physical Exam: Vitals:   09/04/22 2336 09/05/22 0000 09/05/22 0105 09/05/22 0120  BP:  95/74 127/77   Pulse:  (!) 103  (!) 159  Resp:  20 20 (!) 21  Temp:    (!) 97.5 F (36.4 C)  TempSrc:    Oral  SpO2:  97%  94%  Weight: 56.7 kg     Height: 5\' 8"  (1.727 m)       General:  Alert and oriented times three, slender female, no acute distress Eyes: No scleral icterus ENT: Moist oral mucosa, neck supple, tender left occipital lobe knot, not obviously bruised.  Patient with very pounding carotid artery pulse.  No JVD Lungs: CTA, no wheeze, no crackles, no use of accessory muscles Cardiovascular: Tachycardia, RRR, no regurgitation, no gallops.  Abdomen: soft, positive BS, non-tender,  non-distended, not an acute abdomen GU: not examined Neuro: CN II - XII grossly intact, sensation intact Musculoskeletal: strength 5/5 all extremities, no edema.  Generalized musculoskeletal tenderness Skin: no rash, no subcutaneous crepitation, no decubitus Psych: appropriate patient  Labs on Admission:  Recent Labs    09/04/22 2345   NA 134*  K 4.0  CL 107  CO2 16*  GLUCOSE 130*  BUN 34*  CREATININE 1.13*  CALCIUM 8.9  MG 2.1   Recent Labs    09/04/22 2345  AST 58*  ALT 61*  ALKPHOS 110  BILITOT 1.4*  PROT 5.9*  ALBUMIN 3.6    Recent Labs    09/04/22 2345  WBC 8.4  NEUTROABS 6.5  HGB 10.6*  HCT 33.9*  MCV 94.2  PLT 247     Radiological Exams on Admission: DG Hip Unilat W or Wo Pelvis 2-3 Views Right  Result Date: 09/05/2022 CLINICAL DATA:  Fall EXAM: DG HIP (WITH OR WITHOUT PELVIS) 2-3V RIGHT COMPARISON:  Pelvic radiographs dated 09/04/2022 FINDINGS: No fracture is seen. Specifically, the right femoral neck appears intact. Old right pelvic ring fractures. Right hip joint space is preserved. IMPRESSION: No fracture or dislocation is seen. Electronically Signed   By: Charline Bills M.D.   On: 09/05/2022 01:23   CT HEAD WO CONTRAST ( )  Result Date: 09/05/2022 CLINICAL DATA:  Facial trauma, blunt, fall EXAM: CT HEAD WITHOUT CONTRAST CT CERVICAL SPINE WITHOUT CONTRAST TECHNIQUE: Multidetector CT imaging of the head and cervical spine was performed following the standard protocol without intravenous contrast. Multiplanar CT image reconstructions of the cervical spine were also generated. RADIATION DOSE REDUCTION: This exam was performed according to the departmental dose-optimization program which includes automated exposure control, adjustment of the mA and/or kV according to patient size and/or use of iterative reconstruction technique. COMPARISON:  04/07/2021 FINDINGS: CT HEAD FINDINGS Brain: Normal anatomic configuration of the brain. Mild parenchymal volume loss is commensurate with the patient's age. Mild ventriculomegaly with asymmetric involvement of the lateral ventricles appears stable since prior examination likely reflecting the sequela of central atrophy. Mild periventricular white matter changes are again seen, stable, in keeping with changes of probable small vessel ischemia. No acute  intracranial hemorrhage or infarct. No abnormal mass effect or midline shift. No abnormal intra or extra-axial mass lesion. Cerebellum is unremarkable. Vascular: No hyperdense vessel or unexpected calcification. Skull: Normal. Negative for fracture or focal lesion. Sinuses/Orbits: No acute finding. Other: Mastoid air cells and middle ear cavities are clear. Small occipital scalp hematoma at the vertex. CT CERVICAL SPINE FINDINGS Alignment: 2 mm anterolisthesis C5-6, likely degenerative in nature. Otherwise normal cervical lordosis. Skull base and vertebrae: Grade cervical alignment is normal. The atlantodental interval is not widened. No acute fracture of the cervical spine. Ankylosis of the posterior elements of C3-4. Soft tissues and spinal canal: No prevertebral fluid or swelling. No visible canal hematoma. Disc levels: Intervertebral disc space narrowing and endplate remodeling at C6-7 and to a lesser extent C5-6 is in keeping with changes of mild to moderate degenerative disc disease. Prevertebral soft tissues are not thickened on sagittal reformats. Spinal canal is widely patent. Multilevel facet arthrosis results in multilevel moderate neuroforaminal narrowing, most severe at on the right at C3-4 and bilaterally at C6-7. Upper chest: Right pleural effusion.  Mild emphysema. Other: None IMPRESSION: 1. No acute intracranial abnormality. No calvarial fracture. Small occipital scalp hematoma at the vertex. 2. No acute fracture or listhesis of the cervical spine. 3. Right pleural effusion. Electronically Signed  By: Helyn Numbers M.D.   On: 09/05/2022 01:08   CT Cervical Spine Wo Contrast  Result Date: 09/05/2022 CLINICAL DATA:  Facial trauma, blunt, fall EXAM: CT HEAD WITHOUT CONTRAST CT CERVICAL SPINE WITHOUT CONTRAST TECHNIQUE: Multidetector CT imaging of the head and cervical spine was performed following the standard protocol without intravenous contrast. Multiplanar CT image reconstructions of the  cervical spine were also generated. RADIATION DOSE REDUCTION: This exam was performed according to the departmental dose-optimization program which includes automated exposure control, adjustment of the mA and/or kV according to patient size and/or use of iterative reconstruction technique. COMPARISON:  04/07/2021 FINDINGS: CT HEAD FINDINGS Brain: Normal anatomic configuration of the brain. Mild parenchymal volume loss is commensurate with the patient's age. Mild ventriculomegaly with asymmetric involvement of the lateral ventricles appears stable since prior examination likely reflecting the sequela of central atrophy. Mild periventricular white matter changes are again seen, stable, in keeping with changes of probable small vessel ischemia. No acute intracranial hemorrhage or infarct. No abnormal mass effect or midline shift. No abnormal intra or extra-axial mass lesion. Cerebellum is unremarkable. Vascular: No hyperdense vessel or unexpected calcification. Skull: Normal. Negative for fracture or focal lesion. Sinuses/Orbits: No acute finding. Other: Mastoid air cells and middle ear cavities are clear. Small occipital scalp hematoma at the vertex. CT CERVICAL SPINE FINDINGS Alignment: 2 mm anterolisthesis C5-6, likely degenerative in nature. Otherwise normal cervical lordosis. Skull base and vertebrae: Grade cervical alignment is normal. The atlantodental interval is not widened. No acute fracture of the cervical spine. Ankylosis of the posterior elements of C3-4. Soft tissues and spinal canal: No prevertebral fluid or swelling. No visible canal hematoma. Disc levels: Intervertebral disc space narrowing and endplate remodeling at C6-7 and to a lesser extent C5-6 is in keeping with changes of mild to moderate degenerative disc disease. Prevertebral soft tissues are not thickened on sagittal reformats. Spinal canal is widely patent. Multilevel facet arthrosis results in multilevel moderate neuroforaminal narrowing,  most severe at on the right at C3-4 and bilaterally at C6-7. Upper chest: Right pleural effusion.  Mild emphysema. Other: None IMPRESSION: 1. No acute intracranial abnormality. No calvarial fracture. Small occipital scalp hematoma at the vertex. 2. No acute fracture or listhesis of the cervical spine. 3. Right pleural effusion. Electronically Signed   By: Helyn Numbers M.D.   On: 09/05/2022 01:08   DG Pelvis Portable  Result Date: 09/05/2022 CLINICAL DATA:  Fall EXAM: PORTABLE PELVIS 1-2 VIEWS COMPARISON:  CT pelvis dated 04/08/2021 FINDINGS: Old/healed right pelvic ring fractures. Subtle irregularity involving the subcapital right femoral neck. A nondisplaced right femoral neck fracture is not excluded. Consider dedicated right hip radiographs for further evaluation. Left hip is intact.  Visualized bony pelvis is intact. Bilateral joint spaces are preserved. IMPRESSION: Subtle irregularity involving the subcapital right femoral neck. Dedicated right hip radiographs are suggested for further evaluation. Electronically Signed   By: Charline Bills M.D.   On: 09/05/2022 00:06   DG Chest Port 1 View  Result Date: 09/05/2022 CLINICAL DATA:  Fall EXAM: PORTABLE CHEST 1 VIEW COMPARISON:  05/16/2020 FINDINGS: Mild bibasilar atelectasis. No frank interstitial edema. No pleural effusion or pneumothorax. The heart is normal in size. Thoracic aortic atherosclerosis. Left subclavian ICD. IMPRESSION: No acute cardiopulmonary abnormality. Electronically Signed   By: Charline Bills M.D.   On: 09/05/2022 00:04    Assessment/Plan Present on Admission:  Frequent falls -DDx includes UTI, arrhythmia, elderly state -PT consult not yet placed -Patient reluctant to have PT consult,  however not safe with a PT consult -Patient on Xarelto, also not safe for Xarelto given her frequent falls.  Xarelto held  Possible UTI -Urine and blood cultures collected -IV Rocephin started  Arrhythmia/tachycardia/cardiomyopathy/  BiV/ICD/HTN  -Cardiology consulted.  They will contact EP in a.m. -Metoprolol increased from 12.5 mg to 25 mg.  First dose now. -Magnesium and potassium levels normal -Cozaar not resumed as metoprolol may need to be titrated up further  Subtle irregularity involving the subcapital right femoral neck. -CT right hip ordered  History of atrial fibrillation -Currently in sinus rhythm -Xarelto on hold  Elevated LFTs -Mild, new.  Unclear etiology could not infection with blood cultures and urine cultures -CMP in a.m.  Chronic systolic CHF (congestive heart failure) (HCC) -On metoprolol resumed.  Cozaar on hold -No diuretics  Hypothyroidism -Synthroid resumed  GERD -Aciphex resumed  Davide Risdon 09/05/2022, 2:16 AM

## 2022-09-05 NOTE — ED Notes (Signed)
Delayed transfer to room due to CT hold up. Charge notified. Nurse on floor notified.

## 2022-09-05 NOTE — Consult Note (Addendum)
Cardiology Consultation   Patient ID: [name] MRN: [id number]; DOB: 12/16/1932  Admit date: 09/04/2022 Date of Consult: 09/05/2022  PCP:  Frederica Kuster, MD   [facility] Providers Cardiologist:  Peter Swaziland, MD        Patient Profile:   [name] is a 89 y.o. female with a hx of chronic HFrEF s/p prior BiV-ICD downgraded to BiV-PPM  last gen change 2020, Bos Sci), LBBB, NSVT, PAF, moderate MR/TR, small AAA 2.9cm by duplex 11/2020 and left iliac aneurysm previously followed by [facility], anxiety, depression, Barrett's esophagus, gastritis, GERD, HTN, LBBB, HLD, RLS, frequent UTIs, pelvic fracture, hypothyroidism, chronic appearing anemia, CKD 3a by labs who is being seen 09/05/2022 for the evaluation of tachycardia at the request of Dr. Dartha Lodge.  History of Present Illness:   [name] has a longstanding cardiac hx with cardiomyopathy back several decades when she underwent cath in 2006 for EF of 20% which showed no coronary artery disease. She went on to have BiV-ICD insertion with normalization of LVEF. This was later changed to a BiV-PPM. In 2018 she developed PAF and was started on anticoagulation. She was started on Eliquis. She developed itching on Eliquis and switched to Xarelto. Itching did not go away so she was switched to Coumadin. After about a month the itching resolved. She was later switched back to Xarelto without rash. It does not appear that was this big of an issue, often maintaining NSR, until 05/2020 when she was admitted with recurrent HF with drop in EF to 25-30% felt due to return of AF RVR. Given advance age, it was recommended to treat atrial fibrillation and heart failure medically and then reassess EF in 3 months. No ischemic evaluation was felt necessary. She was diuresed. GDMT was limited by soft BP and patient's desire not to be on a lot of medications. She was considered for Tikosyn but did not want to start this. She underwent DCCV which was  unsuccessful so she was started on amiodarone and then had successful DCCV. Last echo 10/2020 showed EF 45-50%, global HK, mildly elevated PASP, moderate MR, moderate TR, trivial pericardial effusion. She is not on SGLT2i due to frequent UTIs. She was hospitalized 03/2021 due to pelvic fx/mechanical fall. She has had concerns about fatigue, hair falling out and gait instability. Toprol reduced by EP. In 10/2021 she wished to stop amiodarone due to concern for side effects. She was weak at 01/2022 follow-up but overall felt a lot better off amiodarone. Device was last checked 04/30/22 with adjustments made at that time, Afib burden <1%.   She presented this admission with increased falling over the last several months. We are unable to get any good history out of her. She is alert and awake but will speak in one or two words but otherwise does not acutely answer our questions. Per chart she fell so hard last night and hit her head that her heighbors heard. These are mechanical falls where she feels like her legs simply give out, no LOC. She has also had some dysuria. UA suggestive of UTI. Labs show mild anemia with Hgb 10 range and probable CKD Cr 1.1 generally c/w prior trends. AST/ALT mildly elevated though AST has been up in the past as well. CXR and CT imaging revealed occipital scalp hematoma, R pleural effusion not seen on CXR, small volume ascites, diffuse soft tissue edema, diverticulosis and aortic atherosclerosis. Cardiology is asked to see for tachycardia. Per discussion with on call  device representative, she has had hundreds of episodes of rapid HR with onset 2 days ago but he is only able to view 6 of the episodes, which appear to his review to be atrial fib with RVR. There were 2 longer episodes that appeared to possibly be atrial flutter. He reports there is something small being sensed by the LV lead, picking up a small signal but that this is of no acute concern. He also reports there is some atrial  undersensing and frequent mode switching. He did not see any evidence of abnormal function otherwise or VT/VF. Overall the predominant arrhythmia seen was reported to be AF RVR. Her blood pressure has been on the softer side to 95/70. She is being treated with brief IVF and started on abx. Her losartan was held. She received 25mg  of Toprol at 252 and 25mg  of Lopressor at 0701.  HR currently 90s-low 100s, SBP low 100s.   Past Medical History:  Diagnosis Date   AAA (abdominal aortic aneurysm) (HCC)    Allergy    all year    Anxiety disorder    Arthritis    Barrett's esophagus 04/2008   Benign neoplasm of colon    Blood transfusion without reported diagnosis    long ago per pt   Cataract    removed both eyes   Chronic airway obstruction, not elsewhere classified    Chronic HFrEF (heart failure with reduced ejection fraction) (HCC)    Complication of anesthesia    slow to wake up   Depressive disorder, not elsewhere classified    Diverticulitis    Dysthymic disorder    Erosive gastritis 08/2004   Frequent urinary tract infections    GERD (gastroesophageal reflux disease)    Hair thinning    HTN (hypertension)    pt denies    Hyperplastic polyps of stomach 06/2002   Hypothyroidism    Iliac aneurysm (HCC)    Incisional hernia    Insomnia 08/20/2015   Kyphosis 08/20/2015   LBBB (left bundle branch block)    LBBB (left bundle branch block)    Melanoma (HCC)    right arm   Mitral regurgitation    Nonischemic cardiomyopathy (HCC)    Osteopenia    Other and unspecified hyperlipidemia    Pacemaker 04/26/2012   PAF (paroxysmal atrial fibrillation) (HCC)    11/2016   Pain in joint, lower leg    Palpitations    Pneumonia    Restless legs syndrome (RLS)    Synovial cyst of popliteal space    Unspecified chronic bronchitis (HCC)    Unspecified nasal polyp    Unspecified sinusitis (chronic)     Past Surgical History:  Procedure Laterality Date   ABDOMINAL HYSTERECTOMY  1076    endometriosis   APPENDECTOMY     BI-VENTRICULAR PACEMAKER INSERTION N/A 04/26/2012   Procedure: BI-VENTRICULAR PACEMAKER INSERTION (CRT-P);  Surgeon: Marinus Maw, MD;  Location: [facility] CATH LAB;  Service: Cardiovascular;  Laterality: N/A;   BIV PACEMAKER GENERATOR CHANGEOUT N/A 01/26/2019   Procedure: BIV PACEMAKER GENERATOR CHANGEOUT;  Surgeon: Marinus Maw, MD;  Location: [facility] INVASIVE CV LAB;  Service: Cardiovascular;  Laterality: N/A;   CARDIOVERSION N/A 05/06/2020   Procedure: CARDIOVERSION;  Surgeon: Chrystie Nose, MD;  Location: [facility] ENDOSCOPY;  Service: Cardiovascular;  Laterality: N/A;   CARDIOVERSION N/A 05/19/2020   Procedure: CARDIOVERSION;  Surgeon: Jake Bathe, MD;  Location: [facility] ENDOSCOPY;  Service: Cardiovascular;  Laterality: N/A;   COLONOSCOPY     defibrillator  insertion     s/p removal of previously implanted BiV ICD and insertion of a new BiV pacemaker on 04/26/12   excise mole of lip  2001   Dr. Everlene Farrier   excision of wen  1984   Dr. Valrie Hart   FEMORAL HERNIA REPAIR  12/25/2012   Dr Derrell Lolling   FEMORAL HERNIA REPAIR  01/04/2013   Recurrent - Dr. Darin Engels HERNIA REPAIR N/A 10/01/2013   Procedure: LAPAROSCOPIC INCISIONAL HERNIA;  Surgeon: Atilano Ina, MD;  Location: [facility] ORS;  Service: General;  Laterality: N/A;   INCISIONAL HERNIA REPAIR N/A 09/07/2018   Procedure: LAPAROSCOPIC ASSISTED REPAIR OF INCISIONAL HERNIA;  Surgeon: Gaynelle Adu, MD;  Location: [facility] OR;  Service: General;  Laterality: N/A;   INGUINAL HERNIA REPAIR Right 09/26/2012   Procedure: RIGHT INGUINAL HERNIA REPAIR WITH MESH;  Surgeon: Adolph Pollack, MD;  Location: [facility] OR;  Service: General;  Laterality: Right;   INGUINAL HERNIA REPAIR Right 12/25/2012   Procedure: explor right groin, small bowel rescection, tissue repair right femoral hernia;  Surgeon: Ernestene Mention, MD;  Location: [facility] ORS;  Service: General;  Laterality: Right;   INSERTION OF MESH Right 09/26/2012   Procedure: INSERTION OF  MESH;  Surgeon: Adolph Pollack, MD;  Location: [facility] OR;  Service: General;  Laterality: Right;   INSERTION OF MESH N/A 10/01/2013   Procedure: INSERTION OF MESH;  Surgeon: Atilano Ina, MD;  Location: [facility] ORS;  Service: General;  Laterality: N/A;   LAPAROSCOPY N/A 01/04/2013   Procedure: Diagnostic Laparoscopy, exploratory laparotomy with small bowel resection, closure of right femoral hernia repair;  Surgeon: Lodema Pilot, DO;  Location: [facility] ORS;  Service: General;  Laterality: N/A;   Left arm surgery     POLYPECTOMY     RIGHT BREAST LUMPECTOMY  1988   Dr. Francina Ames   right knee surgery     arthroscopy: Dr. Simonne Come   TONSILLECTOMY     UPPER GASTROINTESTINAL ENDOSCOPY       Home Medications:  Prior to Admission medications   Medication Sig Start Date End Date Taking? Authorizing Provider  acetaminophen (TYLENOL) 500 MG tablet Take 1,000 mg by mouth every 8 (eight) hours as needed for moderate pain or mild pain.   Yes [provider]  Ascorbic Acid (VITAMIN C) 1000 MG tablet Take 1,000 mg by mouth at bedtime. Ester C   Yes [provider]  Biotin 5000 MCG TABS Take 5,000 mcg by mouth daily.   Yes [provider]  Calcium Carbonate-Vitamin D (CALCIUM + D PO) Take 2 tablets by mouth daily. 1000 units Vitamin D 1200 mg calcium   Yes [provider]  Cyanocobalamin (VITAMIN B 12) 500 MCG TABS Take 1,000 mcg by mouth daily.    Yes [provider]  fexofenadine (ALLEGRA) 180 MG tablet Take 180 mg by mouth daily.   Yes [provider]  levothyroxine (SYNTHROID) 100 MCG tablet Take 1 tablet (100 mcg total) by mouth daily before breakfast. E03.9 08/17/22  Yes Frederica Kuster, MD  losartan (COZAAR) 25 MG tablet TAKE 1 TABLET BY MOUTH EVERY DAY 08/17/22  Yes Sw[place], Peter M, MD  metoprolol succinate (TOPROL XL) 25 MG 24 hr tablet Take 0.5 tablets (12.5 mg total) by mouth daily. 09/02/21  Yes Sheilah Pigeon, PA-C  Polyethyl Glycol-Propyl  Glycol (SYSTANE) 0.4-0.3 % SOLN Place 1 drop into both eyes daily as needed (dry eyes).   Yes [provider]  RABEprazole (ACIPHEX) 20 MG  tablet Take one tablet by mouth once daily for stomach 03/12/22  Yes Frederica Kuster, MD  Rivaroxaban (XARELTO) 15 MG TABS tablet TAKE 1 TABLET DAILY WITH   SUPPER (DOSE REDUCED) 03/12/22  Yes [place], Peter M, MD  estradiol (ESTRACE) 0.1 MG/GM vaginal cream Place 1 g vaginally 2 (two) times a week. Patient not taking: Reported on 02/03/2022 06/13/18   [provider]    Inpatient Medications: Scheduled Meds:  levothyroxine  100 mcg Oral QAC breakfast   metoprolol succinate  25 mg Oral Daily   pantoprazole  40 mg Oral Daily   Continuous Infusions:  sodium chloride 50 mL/hr at 09/05/22 0336   piperacillin-tazobactam (ZOSYN)  IV 3.375 g (09/05/22 0955)   PRN Meds: HYDROmorphone (DILAUDID) injection, metoprolol tartrate, senna-docusate  Allergies:    Allergies  Allergen Reactions   Hydrocodone Itching   Cephalexin Itching and Swelling    Other reaction(s): edema   Ciprofloxacin Other (See Comments)    Not indicated due to aneurysm in aorta     Doxycycline Other (See Comments)    Unknown reaction   Escitalopram Other (See Comments)    Dry mouth   Levofloxacin Other (See Comments)    Not indicated due to aneurysm in aorta    Moxifloxacin Other (See Comments)    Not indicated due to aneurysm in aorta    Ofloxacin Other (See Comments)    Unknown reaction   Sertraline Other (See Comments)    insomnia Other reaction(s): insomnia   Sulfamethoxazole Hives    Any sulfa meds.   Tape Other (See Comments)    Burns skin.    Latex Rash    "If pt. Makes contact with or wearing"   Neomycin Rash    Social History:   Social History   Socioeconomic History   Marital status: Single    Spouse name: Not on file   Number of children: 0   Years of education: Not on file   Highest education level: Not on file  Occupational History    Occupation: Retired    Associate Professor: RETIRED  Tobacco Use   Smoking status: Former    Packs/day: 1.00    Years: 15.00    Additional pack years: 0.00    Total pack years: 15.00    Types: Cigarettes    Quit date: 12/22/1993    Years since quitting: 28.7   Smokeless tobacco: Never   Tobacco comments:    Does'nt reall year quit   Vaping Use   Vaping Use: Never used  Substance and Sexual Activity   Alcohol use: No    Alcohol/week: 0.0 standard drinks of alcohol   Drug use: No   Sexual activity: Never  Other Topics Concern   Not on file  Social History Narrative   Pt lives alone in a condo   Social Determinants of Health   Financial Resource Strain: Not on file  Food Insecurity: Not on file  Transportation Needs: Not on file  Physical Activity: Not on file  Stress: Not on file  Social Connections: Not on file  Intimate Partner Violence: Not on file    Family History:    Family History  Problem Relation Age of Onset   Stroke Father    Heart disease Other        maternal side   Colon cancer Paternal Uncle    Breast cancer Maternal Aunt    Colon cancer Cousin    Diabetes Neg Hx    Colon polyps Neg Hx  Rectal cancer Neg Hx    Stomach cancer Neg Hx      ROS:  Unable to reliably obtain   Physical Exam/Data:   Vitals:   09/05/22 0700 09/05/22 0701 09/05/22 0855 09/05/22 0945  BP: 109/84 109/84 95/70   Pulse: (!) 119 (!) 143 (!) 102 99  Resp: 18  14 14   Temp: 98.4 F (36.9 C)     TempSrc: Axillary  Oral   SpO2: 94%  95% 96%  Weight:      Height:        Intake/Output Summary (Last 24 hours) at 09/05/2022 1124 Last data filed at 09/05/2022 0655 Gross per 24 hour  Intake 169.93 ml  Output --  Net 169.93 ml      09/05/2022    5:16 AM 09/04/2022   11:36 PM 02/03/2022    3:51 PM  Last 3 Weights  Weight (lbs) 127 lb 10.3 oz 125 lb 118 lb 3.2 oz  Weight (kg) 57.9 kg 56.7 kg 53.615 kg     Body mass index is 19.41 kg/m.  Examined per MD. General: Cachectic  appearing WF in no acute distress. Head: Normocephalic, atraumatic, sclera non-icteric, no xanthomas, nares are without discharge. Neck: Negative for carotid bruits. JVP not elevated. Lungs: Clear bilaterally to auscultation without wheezes, rales, or rhonchi. Breathing is unlabored. Heart: Irregularly irregular, borderline elevated, S1 S2 without murmurs, rubs, or gallops.  Abdomen: Soft, non-tender, non-distended with normoactive bowel sounds. No rebound/guarding. Extremities: No clubbing or cyanosis. No edema. Distal pedal pulses are 2+ and equal bilaterally. Neuro/Psych: Awake and tracks eyes, keeps mouth open, looks around to sound of voice, will answer one or two words but does not focally answer A+O questions - have notified IM to evaluate, nurse states she had discussed mental status with attending MD earlier today  EKG:  The EKG was personally reviewed and demonstrates:  suspected atrial fib RVR with LBBB pattern Telemetry:  Telemetry was personally reviewed and demonstrates:  AF RVR  Relevant CV Studies: 2d echo 10/2020    1. Significant improvement in EF since echo done 05/17/20. Left  ventricular ejection fraction, by estimation, is 45 to 50%. The left  ventricle has mildly decreased function. The left ventricle demonstrates  global hypokinesis. The left ventricular  internal cavity size was mildly dilated. Left ventricular diastolic  parameters were normal.   2. ICD wires in RA/RV. Right ventricular systolic function is normal. The  right ventricular size is normal. There is mildly elevated pulmonary  artery systolic pressure.   3. Left atrial size was moderately dilated.   4. The pericardial effusion is posterior to the left ventricle.   5. ? MR from restricted posterior leaflet motion . The mitral valve is  abnormal. Moderate mitral valve regurgitation. No evidence of mitral  stenosis.   6. Tricuspid valve regurgitation is moderate.   7. The aortic valve is tricuspid.  Aortic valve regurgitation is trivial.  No aortic stenosis is present.   8. The inferior vena cava is normal in size with greater than 50%  respiratory variability, suggesting right atrial pressure of 3 mmHg.    Laboratory Data:  High Sensitivity Troponin:  No results for input(s): "TROPONINIHS" in the last 720 hours.   Chemistry Recent Labs  Lab 09/04/22 2345 09/05/22 0335  NA 134* 135  K 4.0 3.8  CL 107 108  CO2 16* 17*  GLUCOSE 130* 119*  BUN 34* 33*  CREATININE 1.13* 1.10*  CALCIUM 8.9 8.7*  MG 2.1 2.1  GFRNONAA 47* 49*  ANIONGAP 11 10    Recent Labs  Lab 09/04/22 2345  PROT 5.9*  ALBUMIN 3.6  AST 58*  ALT 61*  ALKPHOS 110  BILITOT 1.4*   Lipids No results for input(s): "CHOL", "TRIG", "HDL", "LABVLDL", "LDLCALC", "CHOLHDL" in the last 168 hours.  Hematology Recent Labs  Lab 09/04/22 2345 09/05/22 0335  WBC 8.4 8.6  RBC 3.60* 3.44*  HGB 10.6* 10.3*  HCT 33.9* 31.9*  MCV 94.2 92.7  MCH 29.4 29.9  MCHC 31.3 32.3  RDW 15.2 15.1  PLT 247 229   Thyroid No results for input(s): "TSH", "FREET4" in the last 168 hours.  BNPNo results for input(s): "BNP", "PROBNP" in the last 168 hours.  DDimer No results for input(s): "DDIMER" in the last 168 hours.   Radiology/Studies:  CT HIP RIGHT WO CONTRAST  Result Date: 09/05/2022 CLINICAL DATA:  89 year old female with history of fall presenting with right hip pain. EXAM: CT OF THE RIGHT HIP WITHOUT CONTRAST TECHNIQUE: Multidetector CT imaging of the right hip was performed according to the standard protocol. Multiplanar CT image reconstructions were also generated. RADIATION DOSE REDUCTION: This exam was performed according to the departmental dose-optimization program which includes automated exposure control, adjustment of the mA and/or kV according to patient size and/or use of iterative reconstruction technique. COMPARISON:  Right hip radiograph 09/05/2022. FINDINGS: Bones/Joint/Cartilage No acute displaced fracture.  Specifically, right femoral neck appears intact. Old healed fractures with posttraumatic deformity of the right superior and inferior pubic rami are noted. Ligaments Suboptimally assessed by CT. Muscles and Tendons Unremarkable. Soft tissues Mild diffuse soft tissue edema. Numerous vascular calcifications are noted in the visualized infrarenal abdominal aorta and pelvic vasculature. Small volume of ascites noted in the visualized peritoneal cavity. Numerous colonic diverticuli are noted. Suture line in the right lower quadrant of the abdomen, likely from prior partial small bowel resection. IMPRESSION: 1. No acute displaced right hip fracture. 2. Old healed fractures of the right superior and inferior pubic rami. 3. Small volume of ascites. 4. Diffuse soft tissue edema. 5. Colonic diverticulosis. 6. Aortic atherosclerosis. Electronically Signed   By: Trudie Reed M.D.   On: 09/05/2022 05:34   DG Hip Unilat W or Wo Pelvis 2-3 Views Right  Result Date: 09/05/2022 CLINICAL DATA:  Fall EXAM: DG HIP (WITH OR WITHOUT PELVIS) 2-3V RIGHT COMPARISON:  Pelvic radiographs dated 09/04/2022 FINDINGS: No fracture is seen. Specifically, the right femoral neck appears intact. Old right pelvic ring fractures. Right hip joint space is preserved. IMPRESSION: No fracture or dislocation is seen. Electronically Signed   By: Charline Bills M.D.   On: 09/05/2022 01:23   CT HEAD WO CONTRAST ( )  Result Date: 09/05/2022 CLINICAL DATA:  Facial trauma, blunt, fall EXAM: CT HEAD WITHOUT CONTRAST CT CERVICAL SPINE WITHOUT CONTRAST TECHNIQUE: Multidetector CT imaging of the head and cervical spine was performed following the standard protocol without intravenous contrast. Multiplanar CT image reconstructions of the cervical spine were also generated. RADIATION DOSE REDUCTION: This exam was performed according to the departmental dose-optimization program which includes automated exposure control, adjustment of the mA and/or kV  according to patient size and/or use of iterative reconstruction technique. COMPARISON:  04/07/2021 FINDINGS: CT HEAD FINDINGS Brain: Normal anatomic configuration of the brain. Mild parenchymal volume loss is commensurate with the patient's age. Mild ventriculomegaly with asymmetric involvement of the lateral ventricles appears stable since prior examination likely reflecting the sequela of central atrophy. Mild periventricular white matter changes are again seen, stable,  in keeping with changes of probable small vessel ischemia. No acute intracranial hemorrhage or infarct. No abnormal mass effect or midline shift. No abnormal intra or extra-axial mass lesion. Cerebellum is unremarkable. Vascular: No hyperdense vessel or unexpected calcification. Skull: Normal. Negative for fracture or focal lesion. Sinuses/Orbits: No acute finding. Other: Mastoid air cells and middle ear cavities are clear. Small occipital scalp hematoma at the vertex. CT CERVICAL SPINE FINDINGS Alignment: 2 mm anterolisthesis C5-6, likely degenerative in nature. Otherwise normal cervical lordosis. Skull base and vertebrae: Grade cervical alignment is normal. The atlantodental interval is not widened. No acute fracture of the cervical spine. Ankylosis of the posterior elements of C3-4. Soft tissues and spinal canal: No prevertebral fluid or swelling. No visible canal hematoma. Disc levels: Intervertebral disc space narrowing and endplate remodeling at C6-7 and to a lesser extent C5-6 is in keeping with changes of mild to moderate degenerative disc disease. Prevertebral soft tissues are not thickened on sagittal reformats. Spinal canal is widely patent. Multilevel facet arthrosis results in multilevel moderate neuroforaminal narrowing, most severe at on the right at C3-4 and bilaterally at C6-7. Upper chest: Right pleural effusion.  Mild emphysema. Other: None IMPRESSION: 1. No acute intracranial abnormality. No calvarial fracture. Small occipital  scalp hematoma at the vertex. 2. No acute fracture or listhesis of the cervical spine. 3. Right pleural effusion. Electronically Signed   By: Helyn Numbers M.D.   On: 09/05/2022 01:08   CT Cervical Spine Wo Contrast  Result Date: 09/05/2022 CLINICAL DATA:  Facial trauma, blunt, fall EXAM: CT HEAD WITHOUT CONTRAST CT CERVICAL SPINE WITHOUT CONTRAST TECHNIQUE: Multidetector CT imaging of the head and cervical spine was performed following the standard protocol without intravenous contrast. Multiplanar CT image reconstructions of the cervical spine were also generated. RADIATION DOSE REDUCTION: This exam was performed according to the departmental dose-optimization program which includes automated exposure control, adjustment of the mA and/or kV according to patient size and/or use of iterative reconstruction technique. COMPARISON:  04/07/2021 FINDINGS: CT HEAD FINDINGS Brain: Normal anatomic configuration of the brain. Mild parenchymal volume loss is commensurate with the patient's age. Mild ventriculomegaly with asymmetric involvement of the lateral ventricles appears stable since prior examination likely reflecting the sequela of central atrophy. Mild periventricular white matter changes are again seen, stable, in keeping with changes of probable small vessel ischemia. No acute intracranial hemorrhage or infarct. No abnormal mass effect or midline shift. No abnormal intra or extra-axial mass lesion. Cerebellum is unremarkable. Vascular: No hyperdense vessel or unexpected calcification. Skull: Normal. Negative for fracture or focal lesion. Sinuses/Orbits: No acute finding. Other: Mastoid air cells and middle ear cavities are clear. Small occipital scalp hematoma at the vertex. CT CERVICAL SPINE FINDINGS Alignment: 2 mm anterolisthesis C5-6, likely degenerative in nature. Otherwise normal cervical lordosis. Skull base and vertebrae: Grade cervical alignment is normal. The atlantodental interval is not widened. No  acute fracture of the cervical spine. Ankylosis of the posterior elements of C3-4. Soft tissues and spinal canal: No prevertebral fluid or swelling. No visible canal hematoma. Disc levels: Intervertebral disc space narrowing and endplate remodeling at C6-7 and to a lesser extent C5-6 is in keeping with changes of mild to moderate degenerative disc disease. Prevertebral soft tissues are not thickened on sagittal reformats. Spinal canal is widely patent. Multilevel facet arthrosis results in multilevel moderate neuroforaminal narrowing, most severe at on the right at C3-4 and bilaterally at C6-7. Upper chest: Right pleural effusion.  Mild emphysema. Other: None IMPRESSION: 1. No acute  intracranial abnormality. No calvarial fracture. Small occipital scalp hematoma at the vertex. 2. No acute fracture or listhesis of the cervical spine. 3. Right pleural effusion. Electronically Signed   By: Helyn Numbers M.D.   On: 09/05/2022 01:08   DG Pelvis Portable  Result Date: 09/05/2022 CLINICAL DATA:  Fall EXAM: PORTABLE PELVIS 1-2 VIEWS COMPARISON:  CT pelvis dated 04/08/2021 FINDINGS: Old/healed right pelvic ring fractures. Subtle irregularity involving the subcapital right femoral neck. A nondisplaced right femoral neck fracture is not excluded. Consider dedicated right hip radiographs for further evaluation. Left hip is intact.  Visualized bony pelvis is intact. Bilateral joint spaces are preserved. IMPRESSION: Subtle irregularity involving the subcapital right femoral neck. Dedicated right hip radiographs are suggested for further evaluation. Electronically Signed   By: Charline Bills M.D.   On: 09/05/2022 00:06   DG Chest Port 1 View  Result Date: 09/05/2022 CLINICAL DATA:  Fall EXAM: PORTABLE CHEST 1 VIEW COMPARISON:  05/16/2020 FINDINGS: Mild bibasilar atelectasis. No frank interstitial edema. No pleural effusion or pneumothorax. The heart is normal in size. Thoracic aortic atherosclerosis. Left subclavian  ICD. IMPRESSION: No acute cardiopulmonary abnormality. Electronically Signed   By: Charline Bills M.D.   On: 09/05/2022 00:04     Assessment and Plan:   1. Recurrent falls, acute encephalopathy, UTI - reported to be falling since February - had relatively low burden of AF earlier this year but recurrence the last 2 days so cannot explain recurrent falling by AF RVR though certainly not helping her clinical course and future risk - have reported patient's mental status to IM attending and asked that he evaluate - blood cx are pending - agree with holding anticoagulation - unfortunately very difficult situation but with frailty and increased falls including head injury, we may have reached the point where risk outweighs benefit - if she improves would benefit from PT/OT to see  2. Recurrent AF RVR with underlying BiV-PPM - d/w Dr. Jenene Slicker - would hold off amiodarone for now given patient's previous wishes to avoid and inability to communicate with her, and the fact that rate has slightly improved, but treat with beta blocker - given concerns about mental status she recommends IV metoprolol 2.5mg  Q8hr holding for HR <110bpm - anticoag discussion as above - check TSH - may benefit from EP consult in AM depending on progress/eval today  3. Chronic HFrEF/NICM, LBBB - volume status looks OK, small ascites and soft tissue edema by CT imaging but BP too soft to diuresis, judicious use of IVF - add BNP in AM - update echo to eval for recurrent drop in LVEF given recurrent AF - hold off on any other GDMT given soft BP  4. Moderate MR/TR by echo - update echo as above  5. Hypothyroidism - check TSH  6. Anemia similar to prior - added anemia panel in AM  7. CKD 3a - Cr similar to prior   Risk Assessment/Risk Scores:        New York Heart Association (NYHA) Functional Class NYHA Class II-III  CHA2DS2-VASc Score = 6   This indicates a 9.7% annual risk of stroke. The patient's  score is based upon: CHF History: 1 HTN History: 1 Diabetes History: 0 Stroke History: 0 Vascular Disease History: 1 (aortic atherosclerosis) Age Score: 2 Gender Score: 1    2.5 qhr 110   For questions or updates, please contact [facility] Please consult [email or web address] for contact info under    Signed, Laurann Montana, PA-C  09/05/2022 11:24 AM     Attending attestation  Patient is a 17 F known to have NICM LVEF 45 to 50% (previously severely reduced LVEF) in 2022 s/p CRT-D downgraded to CRT-P in 2020, paroxysmal A-fib s/p DCCV in 2022 (amiodarone intolerance) presented to the ER with recurrent falls since 2/24 with no syncope. Patient has burning micturition x several months with positive UA consistent with UTI. She started on broad-spectrum antibiotics per the primary team for management of UTI. EKG showed A-fib with RVR with underlying LBBB.  [employer] device interrogation showed multiple episodes of A-fib with RVR in the last 2 days but no evidence of ventricular arrhythmias.  Physical examination is remarkable for patient being lethargic, somnolent but arousable, HEENT normal, JVD elevated, irregular rate and rhythm, tachycardic, clear lungs, abdomen NT and ND, no edema in bilateral lower extremities.  # Acute metabolic encephalopathy likely secondary to UTI -Unclear if patient will be able to take any p.o. medications today.  Treat with broad-spectrum antibiotics, management per primary team.  # Paroxysmal A-fib with RVR s/p DCCV in 2022, HR 100-110s now -Amiodarone intolerance in 2022. Per cardiology clinic note, patient okayed to be restarted back on amiodarone if need arises. Currently rates are partially controlled, HR 100-110s, in atrial fibrillation. No need of amiodarone as of now. Unclear p.o intake due to encephalopathy. Administer metoprolol tartrate IV pushes 2.5 mg every 8 hours if HR > 110.  Device interrogation showed multiple episodes of atrial  fibrillation with RVR in the last 2 days and device check in 2/24 showed less than 1% A-fib burden. In the setting of infection/sepsis, allow lenient HR control. Due to increased risk of falls since 2/24 and recent fall resulting in scalp hematoma, risks outweigh the benefits of continuing systemic AC.  Hold systemic AC and discontinue upon discharge.  # NICM LVEF 45 to 50% in 2022 s/p CRT-D downgraded to CRT-P in 2020 # Valvular heart disease (moderate MR and TR) -Patient encephalopathic, unknown/unclear p.o. intake.Currently having soft BPs, will hold GDMT. Obtain 2D echocardiogram.   Cutler Sunday Verne Spurr, MD [place]  [facility]  12:39 PM

## 2022-09-05 NOTE — Evaluation (Signed)
Physical Therapy Evaluation Patient Details Name: [name] MRN: [id number] DOB: 04/19/1932 Today's Date: 09/05/2022  History of Present Illness  89 y.o. female presents to [facility] hospital on 09/04/2022 after a fall. Pt reports progressive fatigue since February with frequent falls. PMH includes: AAA, anxiety, CHF, HTN, pacemaker, PNA.  Clinical Impression  Pt presents to PT with deficits in functional mobility, gait, power, strength, endurance, cognition. Pt is generally weak and requires physical assistance to perform bed mobility and transfer. Pt lacks strength and endurance in LE to consistently clear feet when attempting to ambulate. Pt remains at a high risk for falls due to instability and impaired endurance. PT recommends short term inpatient PT services at the time of discharge.       Recommendations for follow up therapy are one component of a multi-disciplinary discharge planning process, led by the attending physician.  Recommendations may be updated based on patient status, additional functional criteria and insurance authorization.  Follow Up Recommendations Can patient physically be transported by private vehicle: No     Assistance Recommended at Discharge Frequent or constant Supervision/Assistance  Patient can return home with the following  A lot of help with walking and/or transfers;A lot of help with bathing/dressing/bathroom;Assistance with cooking/housework;Direct supervision/assist for medications management;Assist for transportation;Direct supervision/assist for financial management;Help with stairs or ramp for entrance    Equipment Recommendations  (defer to post-acute)  Recommendations for Other Services       Functional Status Assessment Patient has had a recent decline in their functional status and demonstrates the ability to make significant improvements in function in a reasonable and predictable amount of time.     Precautions / Restrictions  Precautions Precautions: Fall Restrictions Weight Bearing Restrictions: No      Mobility  Bed Mobility Overal bed mobility: Needs Assistance Bed Mobility: Supine to Sit, Sit to Supine     Supine to sit: Mod assist Sit to supine: Mod assist        Transfers Overall transfer level: Needs assistance Equipment used: Rolling walker (2 wheels), 2 person hand held assist Transfers: Sit to/from Stand, Bed to chair/wheelchair/BSC Sit to Stand: Mod assist, +2 physical assistance Stand pivot transfers: Max assist, +2 physical assistance              Ambulation/Gait Ambulation/Gait assistance: Mod assist Gait Distance (Feet): 3 Feet Assistive device: Rolling walker (2 wheels) Gait Pattern/deviations: Step-to pattern, Shuffle Gait velocity: reduced Gait velocity interpretation: <1.31 ft/sec, indicative of household ambulator   General Gait Details: minimal foot clearance bilaterally. pt unable to step backward 3' toward bed so PT/OT had to move bed behind pt to sit.  Stairs            Wheelchair Mobility    Modified Rankin (Stroke Patients Only)       Balance Overall balance assessment: Needs assistance Sitting-balance support: No upper extremity supported, Feet supported Sitting balance-Leahy Scale: Fair     Standing balance support: Bilateral upper extremity supported, Reliant on assistive device for balance Standing balance-Leahy Scale: Poor                               Pertinent Vitals/Pain Pain Assessment Pain Assessment: No/denies pain    Home Living Family/patient expects to be discharged to:: Private residence Living Arrangements: Alone Available Help at Discharge: Friend(s);Available PRN/intermittently Type of Home: House Home Access: Stairs to enter Entrance Stairs-Rails: None Entrance Stairs-Number of Steps: 1   Home  Layout: One level Home Equipment: Cane - single Librarian, academic (2 wheels)      Prior Function Prior  Level of Function : Independent/Modified Independent;Driving;History of Falls (last six months)             Mobility Comments: ambulatory without DME, 2 falls in last month ADLs Comments: independent with ADLs     Hand Dominance        Extremity/Trunk Assessment   Upper Extremity Assessment Upper Extremity Assessment: Generalized weakness    Lower Extremity Assessment Lower Extremity Assessment: Generalized weakness    Cervical / Trunk Assessment Cervical / Trunk Assessment: Kyphotic  Communication   Communication: No difficulties  Cognition Arousal/Alertness: Awake/alert Behavior During Therapy: WFL for tasks assessed/performed Overall Cognitive Status: Impaired/Different from baseline Area of Impairment: Awareness, Safety/judgement, Following commands, Memory, Attention, Problem solving                   Current Attention Level: Focused Memory: Decreased short-term memory Following Commands: Follows one step commands with increased time Safety/Judgement: Decreased awareness of deficits Awareness: Anticipatory Problem Solving: Slow processing, Requires verbal cues, Difficulty sequencing General Comments: cues throughout for step sequencing and repetition of instructions, pt aware she is at the hospital but asking if this is "some kind of experiment"        General Comments General comments (skin integrity, edema, etc.): HR ranging from 105-125, alarm with arrhythmias noted although PT unsure if this is artifact    Exercises     Assessment/Plan    PT Assessment Patient needs continued PT services  PT Problem List Decreased strength;Decreased activity tolerance;Decreased balance;Decreased mobility;Decreased cognition;Decreased knowledge of use of DME;Decreased knowledge of precautions;Decreased safety awareness;Cardiopulmonary status limiting activity       PT Treatment Interventions Gait training;DME instruction;Stair training;Functional mobility  training;Therapeutic activities;Therapeutic exercise;Balance training;Neuromuscular re-education;Cognitive remediation;Patient/family education    PT Goals (Current goals can be found in the Care Plan section)  Acute Rehab PT Goals Patient Stated Goal: to regain strength, return to independence PT Goal Formulation: With patient Time For Goal Achievement: 09/19/22 Potential to Achieve Goals: Fair    Frequency Min 3X/week     Co-evaluation PT/OT/SLP Co-Evaluation/Treatment: Yes Reason for Co-Treatment: Complexity of the patient's impairments (multi-system involvement);Necessary to address cognition/behavior during functional activity;For patient/therapist safety;To address functional/ADL transfers PT goals addressed during session: Mobility/safety with mobility;Balance;Proper use of DME;Strengthening/ROM         AM-PAC PT "6 Clicks" Mobility  Outcome Measure Help needed turning from your back to your side while in a flat bed without using bedrails?: A Lot Help needed moving from lying on your back to sitting on the side of a flat bed without using bedrails?: A Lot Help needed moving to and from a bed to a chair (including a wheelchair)?: A Lot Help needed standing up from a chair using your arms (e.g., wheelchair or bedside chair)?: A Lot Help needed to walk in hospital room?: Total Help needed climbing 3-5 steps with a railing? : Total 6 Click Score: 10    End of Session Equipment Utilized During Treatment: Gait belt Activity Tolerance: Patient limited by fatigue Patient left: in bed;with call bell/phone within reach;with bed alarm set Nurse Communication: Mobility status PT Visit Diagnosis: Other abnormalities of gait and mobility (R26.89);Muscle weakness (generalized) (M62.81)    Time: 1610-9604 PT Time Calculation (min) (ACUTE ONLY): 28 min   Charges:   PT Evaluation $PT Eval Low Complexity: 1 Low          Michella Detjen J  Verdie Shire, DPT Acute Rehabilitation Office  [phone]   Arlyss Gandy 09/05/2022, 2:44 PM

## 2022-09-05 NOTE — Progress Notes (Signed)
Brief note: -Patient was admitted earlier today following a fall -Patient has had frequent falls. -There are concerns following ICD interrogation. -Cardiology has been consulted. -UA reveals large volume depletion. -Nursing staff from cardiology PA output concerned that patient may be a bit drowsy.  -Patient seen alongside patient's nurse.  Patient is easily arousable.  Patient moves all extremities.  Patient follows commands. -Patient's baseline is unknown to me. -Will get blood sugar level stat.  CBG every 4 hours. -D5 normal saline at 100 cc/h for 10 hours. -Repeat CT head stat without contrast. -Further management will depend on hospital course.  As per H&P done earlier by Dr. Gery Pray: "This is a 89 year old female with past medical history of cardiomyopathy, underlying BiV/ICD, HTN,  hypothyroidism melanoma, anxiety/depression and chronic systolic HF.  The patient lives alone.  She presents to the ER because she has been falling with increasing frequency since February and hurting yourself.  Her last fall was last night, she hit her head loud enough that her neighbors heard.  She did not lose consciousness.  She states her legs simply gave out.  She denies chest pain or pressure.  She denies shortness of breath or cough.  She endorses mild palpitation.  She denies fever, chills, nausea vomiting or diarrhea.  She does endorse mild burning urination over the last several months.  She believes that she had a UTI for several months but has not gone to the doctor.  Due to her falling she seldom leaves her house or drive, and for this reason she has not been to the doctor to check on the possible UTI.  And she missed her 17-month check for ICD.   In the ER her UA suggestive of UTI.  Her ICD was interrogated and between yesterday and today she has had mid episodes of tachycardia heart rate up to the 150s/160s".   On examination: Patient is awake, but a bit slow to response.  Patient follows  commands. Lungs: Clear to auscultation. CVS: S1-S2. Abdomen: Soft and nontender. Neuro: Slow to respond.  Moves all extremities.

## 2022-09-05 NOTE — Progress Notes (Signed)
Pharmacy Antibiotic Note  [name] is a 89 y.o. female admitted on 09/04/2022 with UTI.  Pharmacy has been consulted for Zosyn dosing.  Plan: Zosyn 3.375g IV q8h (4 hour infusion). Follow culture data for de-escalation.  Monitor renal function for dose adjustments as indicated.   Height: 5\' 8"  (172.7 cm) Weight: 56.7 kg (125 lb) IBW/kg (Calculated) : 63.9  Temp (24hrs), Avg:97.5 F (36.4 C), Min:97.5 F (36.4 C), Max:97.5 F (36.4 C)  Recent Labs  Lab 09/04/22 2345  WBC 8.4  CREATININE 1.13*    Estimated Creatinine Clearance: 31.4 mL/min (A) (by C-G formula based on SCr of 1.13 mg/dL (H)).    Allergies  Allergen Reactions   Hydrocodone Itching   Cephalexin Itching and Swelling    Other reaction(s): edema   Ciprofloxacin Other (See Comments)    Not indicated due to aneurysm in aorta     Doxycycline Other (See Comments)    Unknown reaction   Escitalopram Other (See Comments)    Dry mouth   Levofloxacin Other (See Comments)    Not indicated due to aneurysm in aorta    Moxifloxacin Other (See Comments)    Not indicated due to aneurysm in aorta    Ofloxacin Other (See Comments)    Unknown reaction   Sertraline Other (See Comments)    insomnia Other reaction(s): insomnia   Sulfamethoxazole Hives    Any sulfa meds.   Tape Other (See Comments)    Burns skin.    Latex Rash    "If pt. Makes contact with or wearing"   Neomycin Rash    Thank you for allowing pharmacy to be a part of this patient's care.  Estill Batten, PharmD, BCCCP  09/05/2022 2:55 AM

## 2022-09-05 NOTE — ED Notes (Signed)
Pt back from CT, X-ray @ bedside

## 2022-09-05 NOTE — Evaluation (Signed)
Occupational Therapy Evaluation Patient Details Name: [name] MRN: [id number] DOB: [birth year] Today's Date: 09/05/2022   History of Present Illness 89 y.o. female presents to [facility] hospital on 09/04/2022 after a fall. Pt reports progressive fatigue since February with frequent falls. PMH includes: AAA, anxiety, CHF, HTN, pacemaker, PNA.   Clinical Impression   Pt lives alone, reports independence at baseline with ADLs/IADLs, drives and mobilizes without AD. Pt currently needing min-max A for ADLs, mod A for bed mobility, and mod-max A +2 for transfers with RW and 2 person HHA. Pt HR 105-125bpm during mobility. Pt with decreased cognition, needing increased cues/repetition of instructions throughout. Pt presenting with impairments listed below, will follow acutely. Patient will benefit from continued inpatient follow up therapy, <3 hours/day to maximize safety/ind with ADLs/functional mobility.      Recommendations for follow up therapy are one component of a multi-disciplinary discharge planning process, led by the attending physician.  Recommendations may be updated based on patient status, additional functional criteria and insurance authorization.   Assistance Recommended at Discharge Frequent or constant Supervision/Assistance  Patient can return home with the following Two people to help with walking and/or transfers;A lot of help with bathing/dressing/bathroom;Assistance with cooking/housework;Direct supervision/assist for medications management;Direct supervision/assist for financial management;Assist for transportation;Help with stairs or ramp for entrance    Functional Status Assessment  Patient has had a recent decline in their functional status and demonstrates the ability to make significant improvements in function in a reasonable and predictable amount of time.  Equipment Recommendations  Other (comment) (defer)    Recommendations for Other Services PT consult      Precautions / Restrictions Precautions Precautions: Fall Restrictions Weight Bearing Restrictions: No      Mobility Bed Mobility Overal bed mobility: Needs Assistance Bed Mobility: Supine to Sit, Sit to Supine     Supine to sit: Mod assist Sit to supine: Mod assist   General bed mobility comments: assist for trunk elevation OOB, assist for BLE elevation into bed    Transfers Overall transfer level: Needs assistance Equipment used: 2 person hand held assist, Rolling walker (2 wheels) Transfers: Sit to/from Stand, Bed to chair/wheelchair/BSC Sit to Stand: Max assist, +2 physical assistance, Mod assist Stand pivot transfers: Max assist, +2 physical assistance, Mod assist         General transfer comment: max A +2 with 2 person HHA, fading to mod A +2 wtih use of RW, assist to manage RW and cue step sequencing      Balance Overall balance assessment: Needs assistance Sitting-balance support: Feet supported Sitting balance-Leahy Scale: Fair Sitting balance - Comments: can reach minimally outside BOS without LOB   Standing balance support: During functional activity, Reliant on assistive device for balance Standing balance-Leahy Scale: Poor Standing balance comment: reliant on external support                           ADL either performed or assessed with clinical judgement   ADL Overall ADL's : Needs assistance/impaired Eating/Feeding: Minimal assistance;Sitting   Grooming: Minimal assistance;Sitting   Upper Body Bathing: Moderate assistance;Sitting   Lower Body Bathing: Maximal assistance;Sitting/lateral leans   Upper Body Dressing : Moderate assistance;Sitting   Lower Body Dressing: Maximal assistance;Sitting/lateral leans;Sit to/from stand   Toilet Transfer: Maximal assistance;+2 for physical assistance;Squat-pivot;BSC/3in1   Toileting- Clothing Manipulation and Hygiene: Moderate assistance Toileting - Clothing Manipulation Details (indicate cue  type and reason): pericare in sitting with set up A, max  A for pulling up briefs in standing     Functional mobility during ADLs: Maximal assistance;+2 for physical assistance       Vision   Vision Assessment?: No apparent visual deficits     Perception Perception Perception Tested?: No   Praxis Praxis Praxis tested?: Not tested    Pertinent Vitals/Pain Pain Assessment Pain Assessment: No/denies pain     Hand Dominance     Extremity/Trunk Assessment Upper Extremity Assessment Upper Extremity Assessment: Generalized weakness   Lower Extremity Assessment Lower Extremity Assessment: Defer to PT evaluation   Cervical / Trunk Assessment Cervical / Trunk Assessment: Normal   Communication Communication Communication: No difficulties   Cognition Arousal/Alertness: Awake/alert Behavior During Therapy: WFL for tasks assessed/performed Overall Cognitive Status: Impaired/Different from baseline Area of Impairment: Awareness, Safety/judgement, Following commands, Memory, Attention, Problem solving                   Current Attention Level: Focused Memory: Decreased short-term memory Following Commands: Follows one step commands with increased time Safety/Judgement: Decreased awareness of deficits Awareness: Anticipatory (reports desire to use BSC vs purewick) Problem Solving: Slow processing, Requires verbal cues, Difficulty sequencing General Comments: cues throughout for step sequencing and repetition of instructions, pt aware she is at the hospital but asking if this is "some kind of experiment"     General Comments  HR 105-125bpm during session    Exercises     Shoulder Instructions      Home Living Family/patient expects to be discharged to:: Private residence Living Arrangements: Alone Available Help at Discharge: Friend(s);Available PRN/intermittently Type of Home: House Home Access: Stairs to enter [employer] of Steps: 1 Entrance  Stairs-Rails: None Home Layout: One level     Bathroom Shower/Tub: Chief Strategy Officer: Standard     Home Equipment: Cane - single Librarian, academic (2 wheels)          Prior Functioning/Environment Prior Level of Function : Independent/Modified Independent;Driving;History of Falls (last six months)             Mobility Comments: ambulatory without DME, 2 falls in last month ADLs Comments: independent with ADLs        OT Problem List: Decreased strength;Decreased range of motion;Impaired balance (sitting and/or standing);Decreased activity tolerance;Decreased coordination;Decreased cognition;Decreased safety awareness;Cardiopulmonary status limiting activity      OT Treatment/Interventions: Self-care/ADL training;Therapeutic exercise;Energy conservation;DME and/or AE instruction;Therapeutic activities;Patient/family education;Balance training    OT Goals(Current goals can be found in the care plan section) Acute Rehab OT Goals Patient Stated Goal: none stated OT Goal Formulation: With patient Time For Goal Achievement: 09/19/22 Potential to Achieve Goals: Good ADL Goals Pt Will Perform Upper Body Dressing: sitting;with min assist Pt Will Perform Lower Body Dressing: sitting/lateral leans;sit to/from stand;with min assist Pt Will Transfer to Toilet: with min assist;ambulating;regular height toilet Additional ADL Goal #1: pt will perform bed mobility min guard A in prep for ADLs  OT Frequency: Min 1X/week    Co-evaluation PT/OT/SLP Co-Evaluation/Treatment: Yes Reason for Co-Treatment: Complexity of the patient's impairments (multi-system involvement);Necessary to address cognition/behavior during functional activity;For patient/therapist safety;To address functional/ADL transfers PT goals addressed during session: Mobility/safety with mobility;Balance;Proper use of DME;Strengthening/ROM        AM-PAC OT "6 Clicks" Daily Activity     Outcome Measure  Help from another person eating meals?: A Little Help from another person taking care of personal grooming?: A Little Help from another person toileting, which includes using toliet, bedpan, or urinal?: A Lot Help  from another person bathing (including washing, rinsing, drying)?: A Lot Help from another person to put on and taking off regular upper body clothing?: A Lot Help from another person to put on and taking off regular lower body clothing?: A Lot 6 Click Score: 14   End of Session Equipment Utilized During Treatment: Gait belt;Rolling walker (2 wheels) Nurse Communication: Mobility status  Activity Tolerance: Patient tolerated treatment well Patient left: in bed;with call bell/phone within reach;with bed alarm set  OT Visit Diagnosis: Unsteadiness on feet (R26.81);Other abnormalities of gait and mobility (R26.89);Muscle weakness (generalized) (M62.81);History of falling (Z91.81);Repeated falls (R29.6)                Time: 4098-1191 OT Time Calculation (min): 27 min Charges:  OT General Charges $OT Visit: 1 Visit OT Evaluation $OT Eval Moderate Complexity: 1 Mod  Legacy Lacivita K, OTD, OTR/L SecureChat Preferred Acute Rehab [phone]   Mechel Schutter K Koonce 09/05/2022, 1:45 PM

## 2022-09-05 NOTE — Progress Notes (Signed)
Mobility Specialist Progress Note:    09/05/22 1649  Mobility  Activity Transferred to/from [facility]  Level of Assistance Minimal assist, patient does 75% or more  Assistive Device Front wheel walker  Activity Response Tolerated well  Mobility Referral Yes  $Mobility charge 1 Mobility  Mobility Specialist Start Time (ACUTE ONLY) 1600  Mobility Specialist Stop Time (ACUTE ONLY) 1623  Mobility Specialist Time Calculation (min) (ACUTE ONLY) 23 min   Pt received transferring to [facility] w/ RN requesting help, void successful. Pt needed multiple verbal and tactile cues throughout session. For STS pt needed MinA. MS help pt w/ steadying as RN performed pericare. Pt assisted back to bed w/ call bell and personal belongings at hand all needs met.  Thompson Grayer Mobility Specialist  Please contact vis Secure Chat or  Rehab Office [phone]

## 2022-09-05 NOTE — Progress Notes (Signed)
   09/05/22 0516  Assess: MEWS Score  Temp 98.4 F (36.9 C)  BP 108/84  MAP (mmHg) 93  Pulse Rate (!) 143  ECG Heart Rate (!) 144  Resp 18  Level of Consciousness Alert  SpO2 94 %  O2 Device Room Air  Assess: MEWS Score  MEWS Temp 0  MEWS Systolic 0  MEWS Pulse 3  MEWS RR 0  MEWS LOC 0  MEWS Score 3  MEWS Score Color Yellow  Assess: if the MEWS score is Yellow or Red  Were vital signs taken at a resting state? Yes  Focused Assessment No change from prior assessment  Does the patient meet 2 or more of the SIRS criteria? No  MEWS guidelines implemented  Yes, yellow  Treat  MEWS Interventions Considered administering scheduled or prn medications/treatments as ordered  Take Vital Signs  Increase Vital Sign Frequency  Yellow: Q2hr x1, continue Q4hrs until patient remains green for 12hrs  Escalate  MEWS: Escalate Yellow: Discuss with charge nurse and consider notifying provider and/or RRT  Notify: Charge Nurse/RN  Name of Charge Nurse/RN Notified Earleen Newport, RN  Assess: SIRS CRITERIA  SIRS Temperature  0  SIRS Pulse 1  SIRS Respirations  0  SIRS WBC 0  SIRS Score Sum  1

## 2022-09-05 NOTE — ED Notes (Signed)
Patient transported to CT 

## 2022-09-05 NOTE — ED Notes (Signed)
ED TO INPATIENT HANDOFF REPORT  ED Nurse Name and Phone #: Jodie Echevaria 46  S Name/Age/Gender [name] 89 y.o. female Room/Bed: 015C/015C  Code Status   Code Status: Prior  Home/SNF/Other Home Patient oriented to: self, place, time, and situation Is this baseline? Yes   Triage Complete: Triage complete  Chief Complaint Falls frequently [R29.6]  Triage Note Pt BIB EMS from home, neighbor called EMS due to loud thud. Pt found on the floor after a fall. Pt has hx of blood thinners. States she has been having bilateral weakness in legs x 1 year, getting worse recently. No LOC.    Allergies Allergies  Allergen Reactions   Hydrocodone Itching   Cephalexin Itching and Swelling    Other reaction(s): edema   Ciprofloxacin Other (See Comments)    Not indicated due to aneurysm in aorta     Doxycycline Other (See Comments)    Unknown reaction   Escitalopram Other (See Comments)    Dry mouth   Levofloxacin Other (See Comments)    Not indicated due to aneurysm in aorta    Moxifloxacin Other (See Comments)    Not indicated due to aneurysm in aorta    Ofloxacin Other (See Comments)    Unknown reaction   Sertraline Other (See Comments)    insomnia Other reaction(s): insomnia   Sulfamethoxazole Hives    Any sulfa meds.   Tape Other (See Comments)    Burns skin.    Latex Rash    "If pt. Makes contact with or wearing"   Neomycin Rash    Level of Care/Admitting Diagnosis ED Disposition     ED Disposition  Admit   Condition  --   Comment  Hospital Area: [facility] [[id number]]  Level of Care: Telemetry Cardiac [[id number]]  May place patient in observation at [facility] or [facility] if equivalent level of care is available:: No  Covid Evaluation: Confirmed COVID Negative  Diagnosis: Falls frequently [161096]  Admitting Physician: Gery Pray [[id number]]  Attending Physician: Gery Pray [[id number]]          B Medical/Surgery History Past Medical History:   Diagnosis Date   AAA (abdominal aortic aneurysm) (HCC)    2.8 cm 05/2018; 5 year [place] per ACR guidelines   Allergy    all year    Anxiety disorder    Arthritis    Barrett's esophagus 04/2008   Benign neoplasm of colon    Blood transfusion without reported diagnosis    long ago per pt   Cataract    removed both eyes   CHF (congestive heart failure) (HCC)    Chronic airway obstruction, not elsewhere classified    Complication of anesthesia    slow to wake up   Depressive disorder, not elsewhere classified    Diverticulitis    Dysthymic disorder    Erosive gastritis 08/2004   GERD (gastroesophageal reflux disease)    Hair thinning    HTN (hypertension)    pt denies    Hyperplastic polyps of stomach 06/2002   Hypothyroidism    ICD (implantable cardiac defibrillator) in place    BiV/ICD; s/p removal of ICD with insertion of BiV PPM 04/26/12   Incisional hernia    Insomnia 08/20/2015   Kyphosis 08/20/2015   LBBB (left bundle branch block)    Melanoma (HCC)    right arm   Mitral regurgitation    Nonischemic cardiomyopathy (HCC)    EF initially 20%; Last measurement up to 50% per  echo in August of 2012   Osteopenia    Other and unspecified hyperlipidemia    Other and unspecified hyperlipidemia    Pacemaker 04/26/2012   PAF (paroxysmal atrial fibrillation) (HCC)    11/2016   Pain in joint, lower leg    Palpitations    Pneumonia    Restless legs syndrome (RLS)    Synovial cyst of popliteal space    Unspecified chronic bronchitis (HCC)    Unspecified nasal polyp    Unspecified sinusitis (chronic)    Past Surgical History:  Procedure Laterality Date   ABDOMINAL HYSTERECTOMY  1076   endometriosis   APPENDECTOMY     BI-VENTRICULAR PACEMAKER INSERTION N/A 04/26/2012   Procedure: BI-VENTRICULAR PACEMAKER INSERTION (CRT-P);  Surgeon: Marinus Maw, MD;  Location: [facility] CATH LAB;  Service: Cardiovascular;  Laterality: N/A;   BIV PACEMAKER GENERATOR CHANGEOUT N/A 01/26/2019   Procedure:  BIV PACEMAKER GENERATOR CHANGEOUT;  Surgeon: Marinus Maw, MD;  Location: [facility] INVASIVE CV LAB;  Service: Cardiovascular;  Laterality: N/A;   CARDIOVERSION N/A 05/06/2020   Procedure: CARDIOVERSION;  Surgeon: Chrystie Nose, MD;  Location: [facility] ENDOSCOPY;  Service: Cardiovascular;  Laterality: N/A;   CARDIOVERSION N/A 05/19/2020   Procedure: CARDIOVERSION;  Surgeon: Jake Bathe, MD;  Location: [facility] ENDOSCOPY;  Service: Cardiovascular;  Laterality: N/A;   COLONOSCOPY     defibrillator insertion     s/p removal of previously implanted BiV ICD and insertion of a new BiV pacemaker on 04/26/12   excise mole of lip  2001   Dr. Everlene Farrier   excision of wen  1984   Dr. Valrie Hart   FEMORAL HERNIA REPAIR  12/25/2012   Dr Derrell Lolling   FEMORAL HERNIA REPAIR  01/04/2013   Recurrent - Dr. Darin Engels HERNIA REPAIR N/A 10/01/2013   Procedure: LAPAROSCOPIC INCISIONAL HERNIA;  Surgeon: Atilano Ina, MD;  Location: [facility] ORS;  Service: General;  Laterality: N/A;   INCISIONAL HERNIA REPAIR N/A 09/07/2018   Procedure: LAPAROSCOPIC ASSISTED REPAIR OF INCISIONAL HERNIA;  Surgeon: Gaynelle Adu, MD;  [facility];  Service: General;  Laterality: N/A;   INGUINAL HERNIA REPAIR Right 09/26/2012   Procedure: RIGHT INGUINAL HERNIA REPAIR WITH MESH;  Surgeon: Adolph Pollack, MD;  Location: [facility] OR;  Service: General;  Laterality: Right;   INGUINAL HERNIA REPAIR Right 12/25/2012   Procedure: explor right groin, small bowel rescection, tissue repair right femoral hernia;  Surgeon: Ernestene Mention, MD;  Location: [facility] ORS;  Service: General;  Laterality: Right;   INSERTION OF MESH Right 09/26/2012   Procedure: INSERTION OF MESH;  Surgeon: Adolph Pollack, MD;  Location: [facility] OR;  Service: General;  Laterality: Right;   INSERTION OF MESH N/A 10/01/2013   Procedure: INSERTION OF MESH;  Surgeon: Atilano Ina, MD;  Location: [facility] ORS;  Service: General;  Laterality: N/A;   LAPAROSCOPY N/A 01/04/2013   Procedure: Diagnostic  Laparoscopy, exploratory laparotomy with small bowel resection, closure of right femoral hernia repair;  Surgeon: Lodema Pilot, DO;  Location: [facility] ORS;  Service: General;  Laterality: N/A;   Left arm surgery     POLYPECTOMY     RIGHT BREAST LUMPECTOMY  1988   Dr. Francina Ames   right knee surgery     arthroscopy: Dr. Simonne Come   TONSILLECTOMY     UPPER GASTROINTESTINAL ENDOSCOPY       A IV Location/Drains/Wounds Patient Lines/Drains/Airways Status     Active Line/Drains/Airways     Name Placement date Placement time  Site Days   Peripheral IV 09/04/22 20 G 1" Posterior;Right Forearm 09/04/22  2352  Forearm  1   Wound / Incision (Open or Dehisced) 04/08/21 Sacrum Mid stage one 04/08/21  1626  Sacrum  515            Intake/Output Last 24 hours  Intake/Output Summary (Last 24 hours) at 09/05/2022 0245 Last data filed at 09/05/2022 0224 Gross per 24 hour  Intake 42.88 ml  Output --  Net 42.88 ml    Labs/Imaging Results for orders placed or performed during the hospital encounter of 09/04/22 (from the past 48 hour(s))  CBC with Differential     Status: Abnormal   Collection Time: 09/04/22 11:45 PM  Result Value Ref Range   WBC 8.4 4.0 - 10.5 K/uL   RBC 3.60 (L) 3.87 - 5.11 MIL/uL   Hemoglobin 10.6 (L) 12.0 - 15.0 g/dL   HCT 16.1 (L) 09.6 - 04.5 %   MCV 94.2 80.0 - 100.0 fL   MCH 29.4 26.0 - 34.0 pg   MCHC 31.3 30.0 - 36.0 g/dL   RDW 40.9 81.1 - 91.4 %   Platelets 247 150 - 400 K/uL   nRBC 0.0 0.0 - 0.2 %   Neutrophils Relative % 79 %   Neutro Abs 6.5 1.7 - 7.7 K/uL   Lymphocytes Relative 10 %   Lymphs Abs 0.9 0.7 - 4.0 K/uL   Monocytes Relative 11 %   Monocytes Absolute 0.9 0.1 - 1.0 K/uL   Eosinophils Relative 0 %   Eosinophils Absolute 0.0 0.0 - 0.5 K/uL   Basophils Relative 0 %   Basophils Absolute 0.0 0.0 - 0.1 K/uL   Immature Granulocytes 0 %   Abs Immature Granulocytes 0.03 0.00 - 0.07 K/uL    Comment: Performed at [facility], [address],  [place], [place] [address]  Comprehensive metabolic panel     Status: Abnormal   Collection Time: 09/04/22 11:45 PM  Result Value Ref Range   Sodium 134 (L) 135 - 145 mmol/L   Potassium 4.0 3.5 - 5.1 mmol/L   Chloride 107 98 - 111 mmol/L   CO2 16 (L) 22 - 32 mmol/L   Glucose, Bld 130 (H) 70 - 99 mg/dL    Comment: Glucose reference range applies only to samples taken after fasting for at least 8 hours.   BUN 34 (H) 8 - 23 mg/dL   Creatinine, Ser 6.21 (H) 0.44 - 1.00 mg/dL   Calcium 8.9 8.9 - 30.8 mg/dL   Total Protein 5.9 (L) 6.5 - 8.1 g/dL   Albumin 3.6 3.5 - 5.0 g/dL   AST 58 (H) 15 - 41 U/L   ALT 61 (H) 0 - 44 U/L   Alkaline Phosphatase 110 38 - 126 U/L   Total Bilirubin 1.4 (H) 0.3 - 1.2 mg/dL   GFR, Estimated 47 (L) >60 mL/min    Comment: (NOTE) Calculated using the CKD-EPI Creatinine Equation (2021)    Anion gap 11 5 - 15    Comment: Performed at [facility], [address] [place], [place] [address]  Magnesium     Status: None   Collection Time: 09/04/22 11:45 PM  Result Value Ref Range   Magnesium 2.1 1.7 - 2.4 mg/dL    Comment: Performed at [facility], [address] [place], [place] [address]  Urinalysis, w/ Reflex to Culture (Infection Suspected) -Urine, Clean Catch     Status: Abnormal   Collection Time: 09/05/22  12:18 AM  Result Value Ref Range   Specimen Source URINE, CLEAN CATCH    Color, Urine YELLOW YELLOW   APPearance HAZY (A) CLEAR   Specific Gravity, Urine 1.020 1.005 - 1.030   pH 5.0 5.0 - 8.0   Glucose, UA NEGATIVE NEGATIVE mg/dL   Hgb urine dipstick NEGATIVE NEGATIVE   Bilirubin Urine NEGATIVE NEGATIVE   Ketones, ur 5 (A) NEGATIVE mg/dL   Protein, ur 161 (A) NEGATIVE mg/dL   Nitrite NEGATIVE NEGATIVE   Leukocytes,Ua MODERATE (A) NEGATIVE   RBC / HPF 0-5 0 - 5 RBC/hpf   WBC, UA 21-50 0 - 5 WBC/hpf    Comment:        Reflex urine culture not performed if WBC <=10, OR if Squamous epithelial cells >5. If Squamous epithelial cells  >5 suggest recollection.    Bacteria, UA RARE (A) NONE SEEN   Squamous Epithelial / HPF 0-5 0 - 5 /HPF   Mucus PRESENT     Comment: Performed at [facility], [address], [place], [place] [address]   DG Hip Lucienne Capers or [place] Pelvis 2-3 Views Right  Result Date: 09/05/2022 CLINICAL DATA:  Fall EXAM: DG HIP (WITH OR WITHOUT PELVIS) 2-3V RIGHT COMPARISON:  Pelvic radiographs dated 09/04/2022 FINDINGS: No fracture is seen. Specifically, the right femoral neck appears intact. Old right pelvic ring fractures. Right hip joint space is preserved. IMPRESSION: No fracture or dislocation is seen. Electronically Signed   By: Charline Bills M.D.   On: 09/05/2022 01:23   CT HEAD WO CONTRAST ( )  Result Date: 09/05/2022 CLINICAL DATA:  Facial trauma, blunt, fall EXAM: CT HEAD WITHOUT CONTRAST CT CERVICAL SPINE WITHOUT CONTRAST TECHNIQUE: Multidetector CT imaging of the head and cervical spine was performed following the standard protocol without intravenous contrast. Multiplanar CT image reconstructions of the cervical spine were also generated. RADIATION DOSE REDUCTION: This exam was performed according to the departmental dose-optimization program which includes automated exposure control, adjustment of the mA and/or kV according to patient size and/or use of iterative reconstruction technique. COMPARISON:  04/07/2021 FINDINGS: CT HEAD FINDINGS Brain: Normal anatomic configuration of the brain. Mild parenchymal volume loss is commensurate with the patient's age. Mild ventriculomegaly with asymmetric involvement of the lateral ventricles appears stable since prior examination likely reflecting the sequela of central atrophy. Mild periventricular white matter changes are again seen, stable, in keeping with changes of probable small vessel ischemia. No acute intracranial hemorrhage or infarct. No abnormal mass effect or midline shift. No abnormal intra or extra-axial mass lesion. Cerebellum is  unremarkable. Vascular: No hyperdense vessel or unexpected calcification. Skull: Normal. Negative for fracture or focal lesion. Sinuses/Orbits: No acute finding. Other: Mastoid air cells and middle ear cavities are clear. Small occipital scalp hematoma at the vertex. CT CERVICAL SPINE FINDINGS Alignment: 2 mm anterolisthesis C5-6, likely degenerative in nature. Otherwise normal cervical lordosis. Skull base and vertebrae: Grade cervical alignment is normal. The atlantodental interval is not widened. No acute fracture of the cervical spine. Ankylosis of the posterior elements of C3-4. Soft tissues and spinal canal: No prevertebral fluid or swelling. No visible canal hematoma. Disc levels: Intervertebral disc space narrowing and endplate remodeling at C6-7 and to a lesser extent C5-6 is in keeping with changes of mild to moderate degenerative disc disease. Prevertebral soft tissues are not thickened on sagittal reformats. Spinal canal is widely patent. Multilevel facet arthrosis results in multilevel moderate neuroforaminal narrowing, most severe at on the right at C3-4 and bilaterally  at C6-7. Upper chest: Right pleural effusion.  Mild emphysema. Other: None IMPRESSION: 1. No acute intracranial abnormality. No calvarial fracture. Small occipital scalp hematoma at the vertex. 2. No acute fracture or listhesis of the cervical spine. 3. Right pleural effusion. Electronically Signed   By: Helyn Numbers M.D.   On: 09/05/2022 01:08   CT Cervical Spine Wo Contrast  Result Date: 09/05/2022 CLINICAL DATA:  Facial trauma, blunt, fall EXAM: CT HEAD WITHOUT CONTRAST CT CERVICAL SPINE WITHOUT CONTRAST TECHNIQUE: Multidetector CT imaging of the head and cervical spine was performed following the standard protocol without intravenous contrast. Multiplanar CT image reconstructions of the cervical spine were also generated. RADIATION DOSE REDUCTION: This exam was performed according to the departmental dose-optimization program  which includes automated exposure control, adjustment of the mA and/or kV according to patient size and/or use of iterative reconstruction technique. COMPARISON:  04/07/2021 FINDINGS: CT HEAD FINDINGS Brain: Normal anatomic configuration of the brain. Mild parenchymal volume loss is commensurate with the patient's age. Mild ventriculomegaly with asymmetric involvement of the lateral ventricles appears stable since prior examination likely reflecting the sequela of central atrophy. Mild periventricular white matter changes are again seen, stable, in keeping with changes of probable small vessel ischemia. No acute intracranial hemorrhage or infarct. No abnormal mass effect or midline shift. No abnormal intra or extra-axial mass lesion. Cerebellum is unremarkable. Vascular: No hyperdense vessel or unexpected calcification. Skull: Normal. Negative for fracture or focal lesion. Sinuses/Orbits: No acute finding. Other: Mastoid air cells and middle ear cavities are clear. Small occipital scalp hematoma at the vertex. CT CERVICAL SPINE FINDINGS Alignment: 2 mm anterolisthesis C5-6, likely degenerative in nature. Otherwise normal cervical lordosis. Skull base and vertebrae: Grade cervical alignment is normal. The atlantodental interval is not widened. No acute fracture of the cervical spine. Ankylosis of the posterior elements of C3-4. Soft tissues and spinal canal: No prevertebral fluid or swelling. No visible canal hematoma. Disc levels: Intervertebral disc space narrowing and endplate remodeling at C6-7 and to a lesser extent C5-6 is in keeping with changes of mild to moderate degenerative disc disease. Prevertebral soft tissues are not thickened on sagittal reformats. Spinal canal is widely patent. Multilevel facet arthrosis results in multilevel moderate neuroforaminal narrowing, most severe at on the right at C3-4 and bilaterally at C6-7. Upper chest: Right pleural effusion.  Mild emphysema. Other: None IMPRESSION: 1.  No acute intracranial abnormality. No calvarial fracture. Small occipital scalp hematoma at the vertex. 2. No acute fracture or listhesis of the cervical spine. 3. Right pleural effusion. Electronically Signed   By: Helyn Numbers M.D.   On: 09/05/2022 01:08   DG Pelvis Portable  Result Date: 09/05/2022 CLINICAL DATA:  Fall EXAM: PORTABLE PELVIS 1-2 VIEWS COMPARISON:  CT pelvis dated 04/08/2021 FINDINGS: Old/healed right pelvic ring fractures. Subtle irregularity involving the subcapital right femoral neck. A nondisplaced right femoral neck fracture is not excluded. Consider dedicated right hip radiographs for further evaluation. Left hip is intact.  Visualized bony pelvis is intact. Bilateral joint spaces are preserved. IMPRESSION: Subtle irregularity involving the subcapital right femoral neck. Dedicated right hip radiographs are suggested for further evaluation. Electronically Signed   By: Charline Bills M.D.   On: 09/05/2022 00:06   DG Chest Port 1 View  Result Date: 09/05/2022 CLINICAL DATA:  Fall EXAM: PORTABLE CHEST 1 VIEW COMPARISON:  05/16/2020 FINDINGS: Mild bibasilar atelectasis. No frank interstitial edema. No pleural effusion or pneumothorax. The heart is normal in size. Thoracic aortic atherosclerosis. Left subclavian ICD.  IMPRESSION: No acute cardiopulmonary abnormality. Electronically Signed   By: Charline Bills M.D.   On: 09/05/2022 00:04    Pending Labs Unresulted Labs (From admission, onward)     Start     Ordered   09/05/22 0018  Urine Culture  Once,   R        09/05/22 0018            Vitals/Pain Today's Vitals   09/05/22 0000 09/05/22 0105 09/05/22 0120 09/05/22 0200  BP: 95/74 127/77  101/82  Pulse: (!) 103  (!) 159 (!) 114  Resp: 20 20 (!) 21 20  Temp:   (!) 97.5 F (36.4 C)   TempSrc:   Oral   SpO2: 97%  94% 92%  Weight:      Height:      PainSc:   0-No pain     Isolation Precautions No active isolations  Medications Medications  metoprolol  succinate (TOPROL-XL) 24 hr tablet 25 mg (has no administration in time range)  levothyroxine (SYNTHROID) tablet 100 mcg (has no administration in time range)  pantoprazole (PROTONIX) EC tablet 40 mg (has no administration in time range)  Rivaroxaban (XARELTO) tablet 15 mg (has no administration in time range)  piperacillin-tazobactam (ZOSYN) IVPB 3.375 g (0 g Intravenous Stopped 09/05/22 0224)    Mobility walks with device     Focused Assessments Cardiac Assessment Handoff:  Cardiac Rhythm: Atrial fibrillation Lab Results  Component Value Date   TROPONINI <0.30 12/20/2012   No results found for: "DDIMER" Does the Patient currently have chest pain? No   , Neuro Assessment Handoff:  Swallow screen pass? Yes  Cardiac Rhythm: Atrial fibrillation       Neuro Assessment: Within Defined Limits Neuro Checks:      Has TPA been given? No If patient is a Neuro Trauma and patient is going to OR before floor call report to 4N Charge nurse: [phone] or [phone]  , Pulmonary Assessment Handoff:  Lung sounds:   O2 Device: Room Air      R Recommendations: See Admitting Provider Note  Report given to:   Additional Notes: n/a

## 2022-09-06 ENCOUNTER — Inpatient Hospital Stay (HOSPITAL_COMMUNITY): Payer: Medicare Other

## 2022-09-06 DIAGNOSIS — R296 Repeated falls: Secondary | ICD-10-CM | POA: Diagnosis not present

## 2022-09-06 DIAGNOSIS — E43 Unspecified severe protein-calorie malnutrition: Secondary | ICD-10-CM | POA: Insufficient documentation

## 2022-09-06 DIAGNOSIS — I428 Other cardiomyopathies: Secondary | ICD-10-CM

## 2022-09-06 LAB — BASIC METABOLIC PANEL
Anion gap: 11 (ref 5–15)
Anion gap: 7 (ref 5–15)
BUN: 28 mg/dL — ABNORMAL HIGH (ref 8–23)
BUN: 31 mg/dL — ABNORMAL HIGH (ref 8–23)
CO2: 13 mmol/L — ABNORMAL LOW (ref 22–32)
CO2: 18 mmol/L — ABNORMAL LOW (ref 22–32)
Calcium: 8.1 mg/dL — ABNORMAL LOW (ref 8.9–10.3)
Calcium: 7.8 mg/dL — ABNORMAL LOW (ref 8.9–10.3)
Chloride: 116 mmol/L — ABNORMAL HIGH (ref 98–111)
Chloride: 114 mmol/L — ABNORMAL HIGH (ref 98–111)
Creatinine, Ser: 1.13 mg/dL — ABNORMAL HIGH (ref 0.44–1.00)
Creatinine, Ser: 1.19 mg/dL — ABNORMAL HIGH (ref 0.44–1.00)
GFR, Estimated: 47 mL/min — ABNORMAL LOW (ref >60)
GFR, Estimated: 44 mL/min — ABNORMAL LOW (ref >60)
Glucose, Bld: 187 mg/dL — ABNORMAL HIGH (ref 70–99)
Glucose, Bld: 107 mg/dL — ABNORMAL HIGH (ref 70–99)
Potassium: 4.4 mmol/L (ref 3.5–5.1)
Potassium: 3.5 mmol/L (ref 3.5–5.1)
Sodium: 140 mmol/L (ref 135–145)
Sodium: 139 mmol/L (ref 135–145)

## 2022-09-06 LAB — ECHOCARDIOGRAM COMPLETE
AR max vel: 1.61 cm2
AV Area VTI: 1.75 cm2
AV Area mean vel: 1.35 cm2
AV Mean grad: 3 mmHg
AV Peak grad: 5.3 mmHg
Ao pk vel: 1.15 m/s
Area-P 1/2: 6.32 cm2
Height: 68 in
S' Lateral: 4.1 cm
Weight: 2042.34 oz

## 2022-09-06 LAB — CBC
HCT: 27.5 % — ABNORMAL LOW (ref 36.0–46.0)
Hemoglobin: 8.7 g/dL — ABNORMAL LOW (ref 12.0–15.0)
MCH: 30 pg (ref 26.0–34.0)
MCHC: 31.6 g/dL (ref 30.0–36.0)
MCV: 94.8 fL (ref 80.0–100.0)
Platelets: 207 10*3/uL (ref 150–400)
RBC: 2.9 MIL/uL — ABNORMAL LOW (ref 3.87–5.11)
RDW: 15.5 % (ref 11.5–15.5)
WBC: 6.7 10*3/uL (ref 4.0–10.5)
nRBC: 0 % (ref 0.0–0.2)

## 2022-09-06 LAB — GLUCOSE, CAPILLARY
Glucose-Capillary: 126 mg/dL — ABNORMAL HIGH (ref 70–99)
Glucose-Capillary: 182 mg/dL — ABNORMAL HIGH (ref 70–99)
Glucose-Capillary: 133 mg/dL — ABNORMAL HIGH (ref 70–99)
Glucose-Capillary: 120 mg/dL — ABNORMAL HIGH (ref 70–99)
Glucose-Capillary: 119 mg/dL — ABNORMAL HIGH (ref 70–99)

## 2022-09-06 LAB — IRON AND TIBC
Iron: 8 ug/dL — ABNORMAL LOW (ref 28–170)
Saturation Ratios: 4 % — ABNORMAL LOW (ref 10.4–31.8)
TIBC: 224 ug/dL — ABNORMAL LOW (ref 250–450)
UIBC: 216 ug/dL

## 2022-09-06 LAB — VITAMIN B12: Vitamin B-12: 2705 pg/mL — ABNORMAL HIGH (ref 180–914)

## 2022-09-06 LAB — URINE CULTURE

## 2022-09-06 LAB — RETICULOCYTES
Immature Retic Fract: 20.4 % — ABNORMAL HIGH (ref 2.3–15.9)
RBC.: 2.73 MIL/uL — ABNORMAL LOW (ref 3.87–5.11)
Retic Count, Absolute: 62.5 10*3/uL (ref 19.0–186.0)
Retic Ct Pct: 2.3 % (ref 0.4–3.1)

## 2022-09-06 LAB — FERRITIN: Ferritin: 47 ng/mL (ref 11–307)

## 2022-09-06 LAB — FOLATE: Folate: 19.2 ng/mL (ref >5.9)

## 2022-09-06 LAB — BRAIN NATRIURETIC PEPTIDE: B Natriuretic Peptide: 881.5 pg/mL — ABNORMAL HIGH (ref 0.0–100.0)

## 2022-09-06 MED ORDER — METOPROLOL TARTRATE 12.5 MG HALF TABLET: 12.5000 mg | ORAL_TABLET | Freq: Two times a day (BID) | ORAL | Status: DC

## 2022-09-06 MED ORDER — OXYCODONE HCL 5 MG PO TABS: 5.0000 mg | ORAL_TABLET | ORAL | Status: DC | PRN

## 2022-09-06 MED ORDER — ACETAMINOPHEN 325 MG PO TABS
325.0000 mg | ORAL_TABLET | Freq: Four times a day (QID) | ORAL | Status: DC | PRN
  Administered 2022-09-06 – 2022-09-08 (×3): 325 mg via ORAL
  Filled 2022-09-06 (×3): qty 1

## 2022-09-06 MED ORDER — METOPROLOL SUCCINATE ER 25 MG PO TB24
12.5000 mg | ORAL_TABLET | Freq: Two times a day (BID) | ORAL | Status: DC
  Administered 2022-09-06 – 2022-09-10 (×8): 12.5 mg via ORAL
  Filled 2022-09-06 (×8): qty 1

## 2022-09-06 NOTE — Progress Notes (Signed)
Physical Therapy Treatment Patient Details Name: [name] MRN: [id number] DOB: [birth year] Today's Date: 09/06/2022   History of Present Illness 89 y.o. female presents to [facility] hospital on 09/04/2022 after a fall. Pt reports progressive fatigue since February with frequent falls. PMH includes: AAA, anxiety, CHF, HTN, pacemaker, PNA.    PT Comments  Pt admitted with above diagnosis. Pt continues to need incr assist to perform bed mobility and to get OOB.  Cannot stand fully upright and having orthostasis as well.   Pt currently with functional limitations due to the deficits listed below (see PT Problem List). Pt will benefit from acute skilled PT to increase their independence and safety with mobility to allow discharge.       Orthostatic BPs  Supine 95 bpm, 93/71  Sitting 114 bpm, 88/70  Standing Would not register  Standing after 3 min Could not stand this long  Pt pivoted and could not stand for BP to register. Once in chair, pt c/o dizziness and BP 89/51.  With LEs elevated, BP 100/84.   Assistance Recommended at Discharge Frequent or constant Supervision/Assistance  If plan is discharge home, recommend the following:  Can travel by private vehicle    A lot of help with walking and/or transfers;A lot of help with bathing/dressing/bathroom;Assistance with cooking/housework;Direct supervision/assist for medications management;Assist for transportation;Direct supervision/assist for financial management;Help with stairs or ramp for entrance   No  Equipment Recommendations   (defer to post-acute)    Recommendations for Other Services       Precautions / Restrictions Precautions Precautions: Fall Restrictions Weight Bearing Restrictions: No     Mobility  Bed Mobility Overal bed mobility: Needs Assistance Bed Mobility: Supine to Sit, Sit to Supine     Supine to sit: Mod assist Sit to supine: Mod assist   General bed mobility comments: assist for trunk elevation OOB,  assist for BLE elevation into bed    Transfers Overall transfer level: Needs assistance Equipment used: Rolling walker (2 wheels), 2 person hand held assist Transfers: Sit to/from Stand, Bed to chair/wheelchair/BSC Sit to Stand: Mod assist Stand pivot transfers: Max assist         General transfer comment: max A  for power up to RW and max A to pivot with use of RW, assist to manage RW and cue step sequencing    Ambulation/Gait                   Stairs             Wheelchair Mobility     Tilt Bed    Modified Rankin (Stroke Patients Only)       Balance Overall balance assessment: Needs assistance Sitting-balance support: No upper extremity supported, Feet supported Sitting balance-Leahy Scale: Fair Sitting balance - Comments: can reach minimally outside BOS without LOB   Standing balance support: Bilateral upper extremity supported, Reliant on assistive device for balance Standing balance-Leahy Scale: Poor Standing balance comment: reliant on external support and with use of RW                            Cognition Arousal/Alertness: Awake/alert Behavior During Therapy: WFL for tasks assessed/performed Overall Cognitive Status: Impaired/Different from baseline Area of Impairment: Awareness, Safety/judgement, Following commands, Memory, Attention, Problem solving                   Current Attention Level: Focused Memory: Decreased short-term memory Following Commands: Follows  one step commands with increased time Safety/Judgement: Decreased awareness of deficits Awareness: Anticipatory Problem Solving: Slow processing, Requires verbal cues, Difficulty sequencing General Comments: cues throughout for step sequencing and repetition of instructions, pt aware she is at the hospital        Exercises General Exercises - Lower Extremity Ankle Circles/Pumps: AROM, Both, 10 reps, Supine Long Arc Quad: AROM, Both, 10 reps, Seated     General Comments        Pertinent Vitals/Pain Pain Assessment Pain Assessment: No/denies pain Breathing: normal Negative Vocalization: none Facial Expression: smiling or inexpressive Body Language: relaxed Consolability: no need to console PAINAD Score: 0    Home Living                          Prior Function            PT Goals (current goals can now be found in the care plan section) Acute Rehab PT Goals Patient Stated Goal: to regain strength, return to independence Progress towards PT goals: Progressing toward goals    Frequency    Min 3X/week      PT Plan Current plan remains appropriate    Co-evaluation              AM-PAC PT "6 Clicks" Mobility   Outcome Measure  Help needed turning from your back to your side while in a flat bed without using bedrails?: A Lot Help needed moving from lying on your back to sitting on the side of a flat bed without using bedrails?: A Lot Help needed moving to and from a bed to a chair (including a wheelchair)?: A Lot Help needed standing up from a chair using your arms (e.g., wheelchair or bedside chair)?: A Lot Help needed to walk in hospital room?: Total Help needed climbing 3-5 steps with a railing? : Total 6 Click Score: 10    End of Session Equipment Utilized During Treatment: Gait belt Activity Tolerance: Patient limited by fatigue Patient left: with call bell/phone within reach;in chair;with chair alarm set Nurse Communication: Mobility status;Need for lift equipment Antony Salmon) PT Visit Diagnosis: Other abnormalities of gait and mobility (R26.89);Muscle weakness (generalized) (M62.81)     Time: 1610-9604 PT Time Calculation (min) (ACUTE ONLY): 23 min  Charges:    $Therapeutic Exercise: 8-22 mins $Therapeutic Activity: 8-22 mins PT General Charges $$ ACUTE PT VISIT: 1 Visit                     Kainan Patty M,PT Acute Rehab Services [phone]    Bevelyn Buckles 09/06/2022, 2:33 PM

## 2022-09-06 NOTE — Progress Notes (Signed)
PT Note Orthostatic BPs  Supine 95 bpm, 93/71  Sitting 114 bpm, 88/70  Standing Would not register  Standing after 3 min Could not stand this long  Pt pivoted and could not stand for BP to register. Once in chair, pt c/o dizziness and BP 89/51.  With LEs elevated, BP 100/84.   Guillermo Difrancesco M,PT Acute Rehab Services [phone]

## 2022-09-06 NOTE — Discharge Instructions (Signed)
[facility] assistance programs Crisis assistance programs  -The [employer] [phone]) offers several services to local families, as funding allows. The Emergency Assistance Program (EAP), which they administer, provides household goods, free food, clothing, and financial aid to people in need in the [facility] area. The EAP program does have some qualification, and counselors will interview clients for financial assistance by written referral only. Referrals need to be made by the Department of Social Services or by other EAP approved human services agencies or charities in the area.  -[employer] of [employer], which can be reached at [phone], offers emergency assistance programs for those in need of help, such as food, rent assistance, a soup kitchen, shelter, and clothing. They are based in [facility] but provide a number of services to those that qualify for assistance.   [facility] Department of Social Services may be able to offer temporary financial assistance and cash grants for paying rent and utilities, Help may be provided for local county residents who may be experiencing personal crisis when other resources, including government programs, are not available. Call [phone]  -High [employer] is a [employer] agency, The organization can offer emergency assistance for paying rent, [employer], utilities, food, household products and furniture. They offer extensive emergency and transitional housing for families, children and single women, and also run a Boy's and [employer]. Thrift Shops, Secondary school teacher, and other aid offered too. [address] [place], [place] [address], [phone]  -[place] Low Income Energy Assistance Program -- This is offered for [facility] families. The federal government created [employer] Program provides a one-time cash  grant payment to help eligible low-income families pay their electric and heating bills. [address], [place], [place] [address], [phone]  -[place] Emergency Assistance -- A program offers emergency utility and rent funds for greater [employer] area residents. The program can also provide counseling and referrals to charities and government programs. Also provides food and a free meal program that serves lunch Mondays - Saturdays and dinner seven days per week to individuals in the community. [address] [place], [place] [address], [phone]  -[employer] - Offers affordable apartment and housing communities across      [place]. The low income and seniors can access public housing, rental assistance to qualified applicants, and apply for the section 8 rent subsidy program. Other programs include Chiropractor and Engineer, maintenance. [address], [place], [place] [address], dial [phone].  -The [facility] provides transitional housing to veterans and the disabled. Clients will also access other services too, including assistance in applying for Disability, life skills classes, case management, and assistance in finding permanent housing. [address], [place], [place] [address], call [phone]  -[place] Transitional Housing through [employer] is for people who were just evicted or that are formerly homeless. The non-profit will also help then gain self-sufficiency, find a home or apartment to live in, and also provides information on rent assistance when needed. Phone [phone]  -The [place] [employer] [facility] helps low income, elderly, or disabled residents in seven counties in the [place] ([place], [place], [place], [place], [place], [place], [place], and  [place]) save energy and reduce their utility bills by improving energy efficiency. Phone [phone].  -[employer] is located in the [place]  [facility] in the [employer], [address], [address], [place], [place] [address]. Parking is in the rear of the building. Phone: [phone]   General Email: Lattie Corns  [facility] provides free housing counseling assistance in locating affordable rental housing or housing with support services for families and individuals in crisis and the chronically homeless. We provide potential resources for other housing needs like utilities. Our trained counselors also work with clients on budgeting and financial literacy in effort to empower them to take control of their financial situations. [employer] collaborates with homeless service providers and other stakeholders as part of the [employer] COC (Continuum of Care). The (COC) is a regional/local planning body that coordinates housing and services funding for homeless families and individuals. The role of [facility] in the COC is through housing counseling to work with people we serve on diversion strategies for those that are at imminent risk of becoming homeless. We also work with the Coordinated Assessment/Entry Specialist who attempts to find temporary solutions and/or connects the people to Housing First, Rapid Re-housing or transitional housing programs. Our Homelessness Prevention Housing Counselors meet with clients on business days (Monday-Fridays, except scheduled holidays) from 8:30 am to 4:30 pm.  Legal assistance for evictions, foreclosure, and more -If you need free legal advice on civil issues, such as foreclosures, evictions, Electronics engineer, government programs, domestic issues and more, Armed forces operational officer Aid of [facility]) is a Associate Professor firm that provides free legal services and counsel to lower income people, seniors, disabled, and others, The  goal is to ensure everyone has access to justice and fair representation. Call them at ([phone].  [facility] for Housing and Community Studies can provide info about obtaining legal assistance with evictions. Phone [phone].  Data processing manager  The [employer], [employer]. offers job and Dispensing optician. Resources are focused on helping students obtain the skills and experiences that are necessary to compete in today's challenging and tight job market. The non-profit faith-based community action agency offers internship trainings as well as classroom instruction. Classes are tailored to meet the needs of people in the [facility] region. [place], [place] [address], [phone]  Foreclosure prevention/Debt Services Family Services of the [employer] Credit Counseling Service inludes debt and foreclosure prevention programs for local families. This includes money management, financial advice, budget review and development of a written action plan with a Pensions consultant to help solve specific individual financial problems. In addition, housing and mortgage counselors can also provide pre- and post-purchase homeownership counseling, default resolution counseling (to prevent foreclosure) and reverse mortgage counseling. A Debt Management Program allows people and families with a high level of credit card or medical debt to consolidate and repay consumer debt and loans to creditors and rebuild positive credit ratings and scores. Contact (336) Q4373065.  Community clinics in [place] -Health Department [facility]: [address] [place], [place], [address]. [phone].  -Health Department [place] [facility]: [address], [facility], [address]. [phone].  -[facility] offers medical care through a group of doctors, pharmacies and other healthcare related agencies that offer services for low  income, uninsured adults in [place]. Also offers adult Dental care and assistance with applying for an [employer]. Call [phone].   [facility]. This center provides low-cost health care to those without health insurance. Services offered include an onsite pharmacy. Phone [phone]. [address], [address], [place].  -Medication Assistance Program serves as a link between pharmaceutical companies and patients to provide low cost or free prescription medications. This service is available for residents who meet certain income restrictions and have no insurance coverage. PLEASE CALL [phone] Ginette Otto) OR [phone] ([place])  -One Step Further: Materials engineer, The [employer] Support & Nutrition Program, [employer]. Call [phone]/ [phone].  Food pantry and assistance -[facility]: [address], [place] [address]. Phone [phone]  -[facility]: [address] [place], [place] [address]. [phone].  -[place] Food Finder: https://findfood.BargainContractor.[email or web address]  FLEEING VIOLENCE:  -Family Services of the [place]- 24/7 Crisis line [phone]) -[facility] Justice Centers: (336) 641-SAFE [phone])   [place] 2-1-1 is another useful way to locate resources in the community. Visit ShedSizes.ch to find service information online. If you need additional assistance, 2-1-1 Referral Specialists are available 24 hours a day, every day by dialing 2-1-1 or [phone] from any phone. The call is free, confidential, and available in any language.  Affordable Housing Search  http://www.nchousingsearch.Paediatric nurse Crisis Management- 782-801-4048  [facility] [address], [place], [place] [address]. Phone: [phone]

## 2022-09-06 NOTE — Progress Notes (Addendum)
Patient Name: [name] Date of Encounter: 09/06/2022 [facility] Cardiologist: Peter Swaz[place], MD   Interval Summary  .    This morning, patient is more alert and participates in exam. She remains very concerned about the etiology of her acute leg weakness. Denies cardiovascular symptoms this morning, no chest pain, shortness of breath, palpitations. Does not have awareness of afib.  Labs show decreased hemoglobin to 8.7 with low iron, TIBC, and saturation ratio.  Vital Signs .    Vitals:   09/05/22 2035 09/05/22 2315 09/06/22 0156 09/06/22 0407  BP: 104/66 95/68  102/71  Pulse: (!) 109 (!) 110  96  Resp: 16 20  19   Temp: 97.6 F (36.4 C) 97.7 F (36.5 C)  97.8 F (36.6 C)  TempSrc: Oral Oral  Oral  SpO2: 92% 95% 92% 94%  Weight:      Height:        Intake/Output Summary (Last 24 hours) at 09/06/2022 0750 Last data filed at 09/06/2022 0300 Gross per 24 hour  Intake 1992.92 ml  Output 300 ml  Net 1692.92 ml      09/05/2022    5:16 AM 09/04/2022   11:36 PM 02/03/2022    3:51 PM  Last 3 Weights  Weight (lbs) 127 lb 10.3 oz 125 lb 118 lb 3.2 oz  Weight (kg) 57.9 kg 56.7 kg 53.615 kg      Telemetry/ECG    Atrial fibrillation with ventricular rates 100-120 BPM - Personally Reviewed  Physical Exam .   GEN: No acute distress.   Neck: No JVD Cardiac: Irregularly irregular, no murmurs, rubs, or gallops.  Respiratory: right lower lobe diminished GI: Soft, nontender, non-distended  MS: No edema  Assessment & Plan .     Recurrent atrial fibrillation with RVR S/P BiV-PPM  Patient admitted with frequent falls, found with likely near persistent atrial fibrillation/flutter over the last 2-3 days per device interrogation in the setting of UTI. Previous device check in February with less than 1% afib burden. Reportedly this is when fall frequency increased.   Agree with weekend team that at this time, risks>benefits with regard to anticoagulation. Patient  previously on Amiodarone, stopped in August of 2023 due to concern of side effects. Given mental status concerns yesterday, patient was placed on IV Lopressor 2.5mg  Q8hr. She remains in afib with ventricular rates in the low 100s. Given improved mental status today, will switch to PO Metoprolol. BP remains soft, will split home dose Toprol XL 25mg  into 12.5mg  BID dosing. Hold for systolic BP less than 100 mmHg.  If additional rate control needed, will consider EP involvement as patient with elevated creatinine is not ideal patient for Digoxin and Amiodarone was previously d/c with side effects. Would consider Amiodarone as a temporizing agent while inpatient not do not think this is a long-term option.  Chronic HFrEF Hx NICM  Patient with hx EF 20% in 2006 with no CAD. Received BiV-ICD at that time. Subsequent recovery of LVEF until March 2022 when she had decrease back to 25-30%, felt to be afib driven. Last echo later in August 2022 with LVEF 45-50%.   Patient with soft BP this admission BNP 881.5 though she does not appear acutely volume overloaded. Likely afib driven BNP. Continue to hold home Losartan 25mg  with low BP.  No SGLT2i with UTI Repeat echocardiogram today  Recurrent falls Acute encephalopathy with UTI  Patient with increase in fall frequency since February. When fall frequency initially increased, patient had low  afib burden per device. Interrogation this admission does show an increase in frequency starting about 3 days ago. Falls would not seem to be specifically caused by arrhythmia.   Management per primary team Would avoid anticoagulation at current time given anemia and falls.  Iron deficiency anemia  Patient with HBG down to 8.7 this morning with low iron, TIBC, and iron sat. Need to consider IV iron vs PO replacement. Management per primary team.  For questions or updates, please contact [facility] Please consult [email or web address] for contact info under   Signed, Perlie Gold, PA-C   Patient seen and examined, note reviewed with the signed Advanced Practice Provider. I personally reviewed laboratory data, imaging studies and relevant notes. I independently examined the patient and formulated the important aspects of the plan. I have personally discussed the plan with the patient and/or family. Comments or changes to the note/plan are indicated below.   Current atrial fibrillation.  Previously on multiple anticoagulant and most recently Xarelto.  This has been held over the weekend with concern for her multiple falls, risk and benefit-clinically beneficial decision is to discontinue in the systemic anticoagulation giving for supervision and high risk for falling and potential brain bleed.  Will transition her to low-dose Toprol XL hopefully this will help with keeping heart rate in the low 100s or less.  Will take a lenient heart rate approach.  In terms of her ischemic cardiomyopathy in 2022 EF was 45 to 50%, she is status post CRT-P in 2020.  Repeat echo is pending.  Will continue to monitor further recommendations will be made if needed post echo.  Thomasene Ripple DO, MS [facility] Attending Cardiologist [facility]  [address] [employer] [place], [place] [address] [phone] Website: [email or web address]

## 2022-09-06 NOTE — Progress Notes (Signed)
Initial Nutrition Assessment  DOCUMENTATION CODES:   Severe malnutrition in context of chronic illness  INTERVENTION:  Ensure Plus High Protein po BID, each supplement provides 350 kcal and 20 grams of protein.  RD recommends Appetite stimulant- messaged MD   Encourage po intake   NUTRITION DIAGNOSIS:   Severe Malnutrition related to chronic illness as evidenced by severe fat depletion, severe muscle depletion.  GOAL:   Patient will meet greater than or equal to 90% of their needs   MONITOR:   PO intake, Supplement acceptance, Labs, Weight trends, Skin, I & O's  REASON FOR ASSESSMENT:   Consult Assessment of nutrition requirement/status, Poor PO  ASSESSMENT:    89 y.o. female with PMHx including cardiomyopathy, underlying BiV/ICD, HTN, hypothyroidism, melanoma, anxiety/depression, sCHF who presents with with increased falls and hitting her head the most recent time.  Visited patient at bedside who was about to receive patient care. Patient reports a horrible appetite since January 22nd this year. She reports that was after her first fall and then her po intake has decreased significantly, since. RD was not able to get details from patient as nursing was providing care and transferring her to bed. She reports drinking 2 Ensure per day at home and is agreeable to getting them TID while in-patient.   She denies N/V/D/C, trouble chewing/swallowing  Patient is agreeable to an appetite stimulant if MD is agreeable. RD secure chatted hospitalist.   Labs: Glu 187, BUN 28, Cr 1.13, Vitamin B12 2705 Meds: rocephin, synthroid, protonix, D5/NS @100  ml/hr  Wt: no wt loss noted in wt hx  09/05/22 57.9 kg  02/03/22 53.6 kg  12/09/21 54.9 kg  10/30/21 55.2 kg  10/06/21 55.4 kg  09/02/21 55.5 kg  PO: 75% avg meal intake x last 2 documented meals  I/O's: +1.9 L since admission    NUTRITION - FOCUSED PHYSICAL EXAM:  Flowsheet Row Most Recent Value  Orbital Region Severe  depletion  Upper Arm Region Severe depletion  Thoracic and Lumbar Region Unable to assess  Buccal Region Severe depletion  Temple Region Moderate depletion  Clavicle Bone Region Severe depletion  Clavicle and Acromion Bone Region Severe depletion  Scapular Bone Region Unable to assess  Dorsal Hand Severe depletion  Patellar Region Severe depletion  Anterior Thigh Region Severe depletion  Posterior Calf Region Severe depletion  Edema (RD Assessment) None  Hair Reviewed  Eyes Reviewed  Mouth Reviewed  Skin Reviewed  Nails Reviewed       Diet Order:   Diet Order             Diet Heart Room service appropriate? Yes; Fluid consistency: Thin  Diet effective now                   EDUCATION NEEDS:   Education needs have been addressed  Skin:  Skin Assessment: Reviewed RN Assessment  Last BM:  6/29  Height:   Ht Readings from Last 1 Encounters:  09/05/22 5\' 8"  (1.727 m)    Weight:   Wt Readings from Last 1 Encounters:  09/05/22 57.9 kg    Ideal Body Weight:     BMI:  Body mass index is 19.41 kg/m.  Estimated Nutritional Needs:   Kcal:  1500-1800  Protein:  70-90 g  Fluid:  >/= 1.8L    Leodis Rains, RDN, LDN  Clinical Nutrition

## 2022-09-06 NOTE — Progress Notes (Signed)
PROGRESS NOTE    CAMREN WAHLERT  ZOX:[id number] DOB: October 28, 1932 DOA: 09/04/2022 PCP: Frederica Kuster, MD  Outpatient Specialists:     Brief Narrative:  Patient is an 89 year old Caucasian female past medical history significant for cardiomyopathy, status post BiV/ICD, hypertension, hypothyroidism, melanoma, anxiety and depression.  Patient was admitted with frequent falls and altered mentation.  On presentation, patient was lethargic, was not able to communicate.  CT scan came back negative.  MRI brain also came back negative.  Blood sugar was on the low side.  Patient was volume depleted.  Patient has been on D5 normal saline.  Cardiology is assisting in management of anemia persistent atrial fibrillation and other cardiac issues.  Input is highly appreciated.  UA revealed moderate leukocyte, with urine culture growing mixed organisms.  09/06/2022: Patient seen.  Patient is more awake and interactive today.  Patient reports falls since the last 1 to 2 years.  Fall seems to be happening more often.  Patient may be failing to thrive in her current place of both.  Lab work done today revealed BUN of 31, serum creatinine of 1.19 with GFR of 44 mL/min per 1.73 m.  BNP is 881.5.  Iron panel revealed iron of 8, TIBC of 224 and ferritin of 47 (consistent with anemia of chronic inflammation/chronic disease)..   Assessment & Plan:   Principal Problem:   Falls frequently Active Problems:   Fall at home, initial encounter   Chronic systolic CHF (congestive heart failure) (HCC)   UTI (urinary tract infection)   Anxiety and depression   Recurrent falls    Frequent falls -PT/OT consult. -Patient may be failing to thrive. -Patient was volume depleted on presentation. -UA revealed pyuria, but culture grew multiple organisms.  Repeat urine culture. -Patient has been volume replete.  DC IV fluid. -Encourage liberal oral fluid. -Cardiology input is appreciated.   -Based on frequent falls, cardiology  is considering discontinuing DOAC.   Possible UTI -Urinalysis revealed pyuria. -However, urine culture grew multiple organisms (likely contaminants). -Repeat urinalysis and culture. -Patient is on antibiotics.     Arrhythmia/tachycardia/cardiomyopathy/ BiV/ICD/HTN  -Cardiology consulted.   -Will defer to the cardiology team.     Subtle irregularity involving the subcapital right femoral neck. -CT right hip revealed: 1. No acute displaced right hip fracture. 2. Old healed fractures of the right superior and inferior pubic rami. 3. Small volume of ascites. 4. Diffuse soft tissue edema. 5. Colonic diverticulosis. 6. Aortic atherosclerosis.   History of atrial fibrillation -Cardiology team is directing care. -Xarelto on hold   Elevated LFTs -Mild, new.   -Unclear etiology  -Repeat CMP in the morning.    Volume depletion: -Patient has been adequately hydrated. -Hold IV fluids.   Chronic systolic CHF (congestive heart failure) (HCC): -Seems compensated. -Cardiology is directing care.     Hypothyroidism -Synthroid resumed   GERD -Aciphex resumed     DVT prophylaxis: SCD. Code Status: DO NOT RESUSCITATE Family Communication:  Disposition Plan: This will depend on hospital course.   Consultants:  Cardiology.  Procedures:  None.  Antimicrobials:  IV Rocephin.   Subjective: -Patient is awake and alert today. -Patient continues to report frequent falls. -No chest pain or shortness of breath.  Objective: Vitals:   09/06/22 0407 09/06/22 0916 09/06/22 1019 09/06/22 1220  BP: 102/71 99/60 93/71  100/84  Pulse: 96     Resp: 19     Temp: 97.8 F (36.6 C)   (!) 97.5 F (36.4 C)  TempSrc: Oral  Oral  SpO2: 94%     Weight:      Height:        Intake/Output Summary (Last 24 hours) at 09/06/2022 1420 Last data filed at 09/06/2022 0920 Gross per 24 hour  Intake 2092.92 ml  Output 300 ml  Net 1792.92 ml   Filed Weights   09/04/22 2336 09/05/22 0516   Weight: 56.7 kg 57.9 kg    Examination:  General exam: Appears calm and comfortable.  Patient is quite complicated. Respiratory system: Clear to auscultation.  Cardiovascular system: S1 & S2 heard Gastrointestinal system: Abdomen is nondistended, soft and nontender.  Central nervous system: Awake and alert.  Patient moves all extremities.   Extremities: No leg edema.  Data Reviewed: I have personally reviewed following labs and imaging studies  CBC: Recent Labs  Lab 09/04/22 2345 09/05/22 0335 09/06/22 0327  WBC 8.4 8.6 6.7  NEUTROABS 6.5 6.7  --   HGB 10.6* 10.3* 8.7*  HCT 33.9* 31.9* 27.5*  MCV 94.2 92.7 94.8  PLT 247 229 207   Basic Metabolic Panel: Recent Labs  Lab 09/04/22 2345 09/05/22 0335 09/06/22 0820  NA 134* 135 140  K 4.0 3.8 4.4  CL 107 108 116*  CO2 16* 17* 13*  GLUCOSE 130* 119* 187*  BUN 34* 33* 28*  CREATININE 1.13* 1.10* 1.13*  CALCIUM 8.9 8.7* 8.1*  MG 2.1 2.1  --    GFR: Estimated Creatinine Clearance: 32.1 mL/min (A) (by C-G formula based on SCr of 1.13 mg/dL (H)). Liver Function Tests: Recent Labs  Lab 09/04/22 2345  AST 58*  ALT 61*  ALKPHOS 110  BILITOT 1.4*  PROT 5.9*  ALBUMIN 3.6   No results for input(s): "LIPASE", "AMYLASE" in the last 168 hours. No results for input(s): "AMMONIA" in the last 168 hours. Coagulation Profile: No results for input(s): "INR", "PROTIME" in the last 168 hours. Cardiac Enzymes: No results for input(s): "CKTOTAL", "CKMB", "CKMBINDEX", "TROPONINI" in the last 168 hours. BNP (last 3 results) No results for input(s): "PROBNP" in the last 8760 hours. HbA1C: No results for input(s): "HGBA1C" in the last 72 hours. CBG: Recent Labs  Lab 09/05/22 2102 09/05/22 2326 09/06/22 0415 09/06/22 0857 09/06/22 1228  GLUCAP 132* 116* 126* 182* 133*   Lipid Profile: No results for input(s): "CHOL", "HDL", "LDLCALC", "TRIG", "CHOLHDL", "LDLDIRECT" in the last 72 hours. Thyroid Function Tests: Recent  Labs    09/05/22 0333  TSH 2.716   Anemia Panel: Recent Labs    09/06/22 0327  VITAMINB12 2,705*  FOLATE 19.2  FERRITIN 47  TIBC 224*  IRON 8*  RETICCTPCT 2.3   Urine analysis:    Component Value Date/Time   COLORURINE YELLOW 09/05/2022 0018   APPEARANCEUR HAZY (A) 09/05/2022 0018   APPEARANCEUR Clear 11/21/2013 1412   LABSPEC 1.020 09/05/2022 0018   PHURINE 5.0 09/05/2022 0018   GLUCOSEU NEGATIVE 09/05/2022 0018   HGBUR NEGATIVE 09/05/2022 0018   BILIRUBINUR NEGATIVE 09/05/2022 0018   BILIRUBINUR negative 12/09/2021 1618   BILIRUBINUR Negative 10/06/2021 1425   BILIRUBINUR Negative 11/21/2013 1412   KETONESUR 5 (A) 09/05/2022 0018   PROTEINUR 100 (A) 09/05/2022 0018   UROBILINOGEN 0.2 12/09/2021 1618   UROBILINOGEN 0.2 12/31/2012 1321   NITRITE NEGATIVE 09/05/2022 0018   LEUKOCYTESUR MODERATE (A) 09/05/2022 0018   Sepsis Labs: @LABRCNTIP (procalcitonin:4,lacticidven:4)  ) Recent Results (from the past 240 hour(s))  Urine Culture     Status: Abnormal   Collection Time: 09/05/22 12:18 AM   Specimen: Urine, Random  Result  Value Ref Range Status   Specimen Description URINE, RANDOM  Final   Special Requests   Final    NONE Reflexed from [id number] Performed at [facility], [address], [place], [place] [address]    Culture MULTIPLE SPECIES PRESENT, SUGGEST RECOLLECTION (A)  Final   Report Status 09/06/2022 FINAL  Final  Culture, blood (Routine X 2) w Reflex to ID Panel     Status: None (Preliminary result)   Collection Time: 09/05/22  3:35 AM   Specimen: BLOOD  Result Value Ref Range Status   Specimen Description BLOOD BLOOD LEFT ARM  Final   Special Requests   Final    BOTTLES DRAWN AEROBIC AND ANAEROBIC Blood Culture results may not be optimal due to an excessive volume of blood received in culture bottles   Culture   Final    NO GROWTH < 12 HOURS Performed at [facility], [address], [place], [place] [address]    Report Status PENDING   Incomplete  Culture, blood (Routine X 2) w Reflex to ID Panel     Status: None (Preliminary result)   Collection Time: 09/05/22  3:35 AM   Specimen: BLOOD  Result Value Ref Range Status   Specimen Description BLOOD BLOOD RIGHT ARM  Final   Special Requests   Final    BOTTLES DRAWN AEROBIC AND ANAEROBIC Blood Culture results may not be optimal due to an inadequate volume of blood received in culture bottles   Culture   Final    NO GROWTH < 12 HOURS Performed at [facility], [address] [place], [place] [address]    Report Status PENDING  Incomplete         Radiology Studies: CT HEAD WO CONTRAST ( )  Result Date: 09/05/2022 CLINICAL DATA:  Altered mental status EXAM: CT HEAD WITHOUT CONTRAST TECHNIQUE: Contiguous axial images were obtained from the base of the skull through the vertex without intravenous contrast. RADIATION DOSE REDUCTION: This exam was performed according to the departmental dose-optimization program which includes automated exposure control, adjustment of the mA and/or kV according to patient size and/or use of iterative reconstruction technique. COMPARISON:  Previous studies including the examination done earlier today FINDINGS: Brain: No acute intracranial findings are seen. There are no signs of bleeding within the cranium. Cortical sulci are prominent. There is decreased density in periventricular and subcortical white matter. Vascular: Scattered arterial calcifications are seen. Skull: No fracture is seen in calvarium. Sinuses/Orbits: There is mucosal thickening in ethmoid sinus. There are no air-fluid levels. Other: No significant interval changes are noted. IMPRESSION: No acute intracranial findings are seen. Atrophy. Small-vessel disease. Electronically Signed   By: Ernie Avena M.D.   On: 09/05/2022 12:52   CT HIP RIGHT WO CONTRAST  Result Date: 09/05/2022 CLINICAL DATA:  89 year old female with history of fall presenting with right hip pain.  EXAM: CT OF THE RIGHT HIP WITHOUT CONTRAST TECHNIQUE: Multidetector CT imaging of the right hip was performed according to the standard protocol. Multiplanar CT image reconstructions were also generated. RADIATION DOSE REDUCTION: This exam was performed according to the departmental dose-optimization program which includes automated exposure control, adjustment of the mA and/or kV according to patient size and/or use of iterative reconstruction technique. COMPARISON:  Right hip radiograph 09/05/2022. FINDINGS: Bones/Joint/Cartilage No acute displaced fracture. Specifically, right femoral neck appears intact. Old healed fractures with posttraumatic deformity of the right superior and inferior pubic rami are noted. Ligaments Suboptimally assessed by CT. Muscles  and Tendons Unremarkable. Soft tissues Mild diffuse soft tissue edema. Numerous vascular calcifications are noted in the visualized infrarenal abdominal aorta and pelvic vasculature. Small volume of ascites noted in the visualized peritoneal cavity. Numerous colonic diverticuli are noted. Suture line in the right lower quadrant of the abdomen, likely from prior partial small bowel resection. IMPRESSION: 1. No acute displaced right hip fracture. 2. Old healed fractures of the right superior and inferior pubic rami. 3. Small volume of ascites. 4. Diffuse soft tissue edema. 5. Colonic diverticulosis. 6. Aortic atherosclerosis. Electronically Signed   By: Trudie Reed M.D.   On: 09/05/2022 05:34   DG Hip Unilat W or Wo Pelvis 2-3 Views Right  Result Date: 09/05/2022 CLINICAL DATA:  Fall EXAM: DG HIP (WITH OR WITHOUT PELVIS) 2-3V RIGHT COMPARISON:  Pelvic radiographs dated 09/04/2022 FINDINGS: No fracture is seen. Specifically, the right femoral neck appears intact. Old right pelvic ring fractures. Right hip joint space is preserved. IMPRESSION: No fracture or dislocation is seen. Electronically Signed   By: Charline Bills M.D.   On: 09/05/2022 01:23    CT HEAD WO CONTRAST ( )  Result Date: 09/05/2022 CLINICAL DATA:  Facial trauma, blunt, fall EXAM: CT HEAD WITHOUT CONTRAST CT CERVICAL SPINE WITHOUT CONTRAST TECHNIQUE: Multidetector CT imaging of the head and cervical spine was performed following the standard protocol without intravenous contrast. Multiplanar CT image reconstructions of the cervical spine were also generated. RADIATION DOSE REDUCTION: This exam was performed according to the departmental dose-optimization program which includes automated exposure control, adjustment of the mA and/or kV according to patient size and/or use of iterative reconstruction technique. COMPARISON:  04/07/2021 FINDINGS: CT HEAD FINDINGS Brain: Normal anatomic configuration of the brain. Mild parenchymal volume loss is commensurate with the patient's age. Mild ventriculomegaly with asymmetric involvement of the lateral ventricles appears stable since prior examination likely reflecting the sequela of central atrophy. Mild periventricular white matter changes are again seen, stable, in keeping with changes of probable small vessel ischemia. No acute intracranial hemorrhage or infarct. No abnormal mass effect or midline shift. No abnormal intra or extra-axial mass lesion. Cerebellum is unremarkable. Vascular: No hyperdense vessel or unexpected calcification. Skull: Normal. Negative for fracture or focal lesion. Sinuses/Orbits: No acute finding. Other: Mastoid air cells and middle ear cavities are clear. Small occipital scalp hematoma at the vertex. CT CERVICAL SPINE FINDINGS Alignment: 2 mm anterolisthesis C5-6, likely degenerative in nature. Otherwise normal cervical lordosis. Skull base and vertebrae: Grade cervical alignment is normal. The atlantodental interval is not widened. No acute fracture of the cervical spine. Ankylosis of the posterior elements of C3-4. Soft tissues and spinal canal: No prevertebral fluid or swelling. No visible canal hematoma. Disc levels:  Intervertebral disc space narrowing and endplate remodeling at C6-7 and to a lesser extent C5-6 is in keeping with changes of mild to moderate degenerative disc disease. Prevertebral soft tissues are not thickened on sagittal reformats. Spinal canal is widely patent. Multilevel facet arthrosis results in multilevel moderate neuroforaminal narrowing, most severe at on the right at C3-4 and bilaterally at C6-7. Upper chest: Right pleural effusion.  Mild emphysema. Other: None IMPRESSION: 1. No acute intracranial abnormality. No calvarial fracture. Small occipital scalp hematoma at the vertex. 2. No acute fracture or listhesis of the cervical spine. 3. Right pleural effusion. Electronically Signed   By: Helyn Numbers M.D.   On: 09/05/2022 01:08   CT Cervical Spine Wo Contrast  Result Date: 09/05/2022 CLINICAL DATA:  Facial trauma, blunt, fall EXAM: CT HEAD  WITHOUT CONTRAST CT CERVICAL SPINE WITHOUT CONTRAST TECHNIQUE: Multidetector CT imaging of the head and cervical spine was performed following the standard protocol without intravenous contrast. Multiplanar CT image reconstructions of the cervical spine were also generated. RADIATION DOSE REDUCTION: This exam was performed according to the departmental dose-optimization program which includes automated exposure control, adjustment of the mA and/or kV according to patient size and/or use of iterative reconstruction technique. COMPARISON:  04/07/2021 FINDINGS: CT HEAD FINDINGS Brain: Normal anatomic configuration of the brain. Mild parenchymal volume loss is commensurate with the patient's age. Mild ventriculomegaly with asymmetric involvement of the lateral ventricles appears stable since prior examination likely reflecting the sequela of central atrophy. Mild periventricular white matter changes are again seen, stable, in keeping with changes of probable small vessel ischemia. No acute intracranial hemorrhage or infarct. No abnormal mass effect or midline shift.  No abnormal intra or extra-axial mass lesion. Cerebellum is unremarkable. Vascular: No hyperdense vessel or unexpected calcification. Skull: Normal. Negative for fracture or focal lesion. Sinuses/Orbits: No acute finding. Other: Mastoid air cells and middle ear cavities are clear. Small occipital scalp hematoma at the vertex. CT CERVICAL SPINE FINDINGS Alignment: 2 mm anterolisthesis C5-6, likely degenerative in nature. Otherwise normal cervical lordosis. Skull base and vertebrae: Grade cervical alignment is normal. The atlantodental interval is not widened. No acute fracture of the cervical spine. Ankylosis of the posterior elements of C3-4. Soft tissues and spinal canal: No prevertebral fluid or swelling. No visible canal hematoma. Disc levels: Intervertebral disc space narrowing and endplate remodeling at C6-7 and to a lesser extent C5-6 is in keeping with changes of mild to moderate degenerative disc disease. Prevertebral soft tissues are not thickened on sagittal reformats. Spinal canal is widely patent. Multilevel facet arthrosis results in multilevel moderate neuroforaminal narrowing, most severe at on the right at C3-4 and bilaterally at C6-7. Upper chest: Right pleural effusion.  Mild emphysema. Other: None IMPRESSION: 1. No acute intracranial abnormality. No calvarial fracture. Small occipital scalp hematoma at the vertex. 2. No acute fracture or listhesis of the cervical spine. 3. Right pleural effusion. Electronically Signed   By: Helyn Numbers M.D.   On: 09/05/2022 01:08   DG Pelvis Portable  Result Date: 09/05/2022 CLINICAL DATA:  Fall EXAM: PORTABLE PELVIS 1-2 VIEWS COMPARISON:  CT pelvis dated 04/08/2021 FINDINGS: Old/healed right pelvic ring fractures. Subtle irregularity involving the subcapital right femoral neck. A nondisplaced right femoral neck fracture is not excluded. Consider dedicated right hip radiographs for further evaluation. Left hip is intact.  Visualized bony pelvis is intact.  Bilateral joint spaces are preserved. IMPRESSION: Subtle irregularity involving the subcapital right femoral neck. Dedicated right hip radiographs are suggested for further evaluation. Electronically Signed   By: Charline Bills M.D.   On: 09/05/2022 00:06   DG Chest Port 1 View  Result Date: 09/05/2022 CLINICAL DATA:  Fall EXAM: PORTABLE CHEST 1 VIEW COMPARISON:  05/16/2020 FINDINGS: Mild bibasilar atelectasis. No frank interstitial edema. No pleural effusion or pneumothorax. The heart is normal in size. Thoracic aortic atherosclerosis. Left subclavian ICD. IMPRESSION: No acute cardiopulmonary abnormality. Electronically Signed   By: Charline Bills M.D.   On: 09/05/2022 00:04        Scheduled Meds:  feeding supplement  237 mL Oral BID BM   levothyroxine  100 mcg Oral QAC breakfast   metoprolol succinate  12.5 mg Oral BID   pantoprazole  40 mg Oral Daily   Continuous Infusions:  cefTRIAXone (ROCEPHIN)  IV Stopped (09/05/22 1539)  dextrose 5 % and 0.9 % NaCl 100 mL/hr at 09/06/22 0918     LOS: 1 day    Time spent: 55 minutes.    Berton Mount, MD  [facility] Pager #: [phone] 7PM-7AM contact night coverage as above

## 2022-09-06 NOTE — [place] FL2 (Signed)
[place] MEDICAID FL2 LEVEL OF CARE FORM     IDENTIFICATION  Patient Name: [name] Birthdate: 02-Feb-1933 Sex: female Admission Date (Current Location): 09/04/2022  [facility] and [place] Number:  Producer, television/film/video and Address:  [facility] [place], [address], [place], [place] [address]      Provider Number: [id number]  Attending Physician Name and Address:  Barnetta Chapel, MD  Relative Name and Phone Number:       Current Level of Care: Hospital Recommended Level of Care: Skilled Nursing Facility Prior Approval Number:    Date Approved/Denied:   PASRR Number: [id number]  Discharge Plan: SNF    Current Diagnoses: Patient Active Problem List   Diagnosis Date Noted   UTI (urinary tract infection) 09/05/2022   Anxiety and depression 09/05/2022   Falls frequently 09/05/2022   Recurrent falls 09/05/2022   Fracture 04/09/2021   Fall at home, initial encounter 04/08/2021   Chronic systolic CHF (congestive heart failure) (HCC) 04/08/2021   Pubic ramus fracture (HCC) 04/07/2021   Moderate episode of recurrent major depressive disorder (HCC) 03/23/2021   Non-pressure chronic ulcer of left ankle with fat layer exposed (HCC) 01/01/2021   Persistent atrial fibrillation (HCC)    Acute on chronic congestive heart failure (HCC) 04/28/2020   Incisional hernia 09/07/2018   Coronary atherosclerosis due to calcified coronary lesion of native artery 12/09/2017   Anticoagulated 11/07/2015   Lumbago 08/20/2015   Kyphosis 08/20/2015   Insomnia 08/20/2015   Aortic atherosclerosis (HCC) 08/20/2015   Abdominal bruit 08/20/2015   Aortic ectasia (HCC) 08/20/2015   AAA (abdominal aortic aneurysm) without rupture (HCC) 08/20/2015   B12 deficiency 05/21/2015   HTN (hypertension) 11/19/2014   Cardiomyopathy, dilated, nonischemic (HCC) 07/18/2014   Biventricular cardiac pacemaker in situ 04/24/2014   Chronic cough 04/17/2014   Aneurysm of iliac artery (HCC)  04/17/2014   Incisional hernia, without obstruction or gangrene 11/21/2013   Frequency of urination 11/21/2013   Malaise and fatigue 05/08/2013   Raynaud disease 05/08/2013   Moderate malnutrition (HCC) 12/24/2012   Hypothyroidism    Hair thinning    Anxiety disorder    Hyperlipidemia    Sinusitis, chronic    Chronic bronchitis (HCC)    Depression    Restless legs syndrome (RLS)    PVC (premature ventricular contraction) 06/08/2011   GERD 02/03/2009   Acute on chronic systolic CHF (congestive heart failure) (HCC) 12/12/2008   BARRETT'S ESOPHAGUS 05/29/2008   Gastritis and gastroduodenitis 05/29/2008    Orientation RESPIRATION BLADDER Height & Weight     Self, Time, Situation, Place  Normal Continent, External catheter Weight: 127 lb 10.3 oz (57.9 kg) Height:  5\' 8"  (172.7 cm)  BEHAVIORAL SYMPTOMS/MOOD NEUROLOGICAL BOWEL NUTRITION STATUS      Continent Diet (See DC Summary)  AMBULATORY STATUS COMMUNICATION OF NEEDS Skin   Limited Assist Verbally Normal                       Personal Care Assistance Level of Assistance  Bathing, Feeding, Dressing Bathing Assistance: Maximum assistance Feeding assistance: Limited assistance Dressing Assistance: Limited assistance     Functional Limitations Info  Sight Sight Info: Impaired        SPECIAL CARE FACTORS FREQUENCY  PT (By licensed PT), OT (By licensed OT)     PT Frequency: 5x/week OT Frequency: 5x/week            Contractures Contractures Info: Not present    Additional Factors  Info  Code Status, Allergies Code Status Info: DNR Allergies Info: Hydrocodone, Cephalexin, Ciprofloxacin, Doxycycline, Escitalopram, Levofloxacin, Moxifloxacin, Ofloxacin, Sertraline, Sulfamethoxazole, Tape, Latex, Neomycin           Current Medications (09/06/2022):  This is the current hospital active medication list Current Facility-Administered Medications  Medication Dose Route Frequency Provider Last Rate Last Admin    acetaminophen (TYLENOL) tablet 325 mg  325 mg Oral Q6H PRN Opyd, Lavone Neri, MD   325 mg at 09/06/22 0603   cefTRIAXone (ROCEPHIN) 1 g in sodium chloride 0.9 % 100 mL IVPB  1 g Intravenous Q24H Berton Mount I, MD   Stopped at 09/05/22 1539   dextrose 5 %-0.9 % sodium chloride infusion   Intravenous Continuous Berton Mount I, MD 100 mL/hr at 09/05/22 2241 New Bag at 09/05/22 2241   feeding supplement (ENSURE ENLIVE / ENSURE PLUS) liquid 237 mL  237 mL Oral BID BM Berton Mount I, MD   237 mL at 09/05/22 1634   levothyroxine (SYNTHROID) tablet 100 mcg  100 mcg Oral QAC breakfast Joneen Roach, Debby, MD   100 mcg at 09/06/22 0603   lip balm (CARMEX) ointment 1 Application  1 Application Topical PRN Berton Mount I, MD   1 Application at 09/05/22 2307   metoprolol tartrate (LOPRESSOR) tablet 12.5 mg  12.5 mg Oral BID Perlie Gold, PA-C       Oral care mouth rinse  15 mL Mouth Rinse PRN Berton Mount I, MD       oxyCODONE (Oxy IR/ROXICODONE) immediate release tablet 5 mg  5 mg Oral Q4H PRN Opyd, Lavone Neri, MD       pantoprazole (PROTONIX) EC tablet 40 mg  40 mg Oral Daily Crosley, Debby, MD   40 mg at 09/05/22 0942   senna-docusate (Senokot-S) tablet 1 tablet  1 tablet Oral QHS PRN Gery Pray, MD         Discharge Medications: Please see discharge summary for a list of discharge medications.  Relevant Imaging Results:  Relevant Lab Results:   Additional Information SSN [id number] [place], [place]

## 2022-09-06 NOTE — TOC Initial Note (Signed)
Transition of Care [facility]) - Initial/Assessment Note    Patient Details  Name: [name] MRN: [id number] Date of Birth: 24-Aug-1932  Transition of Care [facility]) CM/SW Contact:    Mearl Latin, LCSW Phone Number: 09/06/2022, 3:38 PM  Clinical Narrative:                 CSW received consult for possible SNF placement at time of discharge. CSW spoke with patient. Patient reported that she lives alone but has friends that help her. Patient expressed understanding of PT recommendation but does not like the sound of SNF placement at time of discharge. She reported that she had [facility] to [facility] before but did not see a lot of benefit and she would rather be in her own home. CSW emphasized that patient would nee to have a good support system at home in order to have home health as she is not currently ambulating. She reported agreement for CSW to send out SNF referrals but declined for CSW to contact any friends or family. CSW discussed insurance authorization process and will provide Medicare SNF ratings list. CSW will send out referrals for review and provide bed offers as available.   Skilled Nursing Rehab Facilities-   [email or web address]   Ratings out of 5 stars (5 the highest)   Name Address  Phone # Quality Care Staffing Health Inspection Overall  [facility] [address], [place] [phone] 2 1 5 4   [facility] [address] [place] [phone] 4 1 3 2   [facility] [address], [place] [phone] 2 1 1 1   [facility] [address], [place] [phone] 4 1 3 2   [facility]  [address], [place] [phone] 3 2 5 5   [facility] [address] [facility] [phone] 2 1 2 1   [facility] [address], [place] [phone] 4 1 2 1   [facility] [address] [place] [phone] 2 4 3 3   [address] ([place]) [address], [place] [phone] 3  2 2 2   [facility] [address] [place], [place] [phone] 1 2 1 1   [facility] [address], [place] [phone] 3 1 1 1   [facility] [address] [place] [phone] 4 3 4 4   [facility] [address] [place] [phone] 3 4 3 3           [facility] [address] [place] [phone]      KKXFGHW EXHBZJIRCV, [place] [address], [place] [phone] 1 1 2 1   [facility] [address], [place] [phone] 2 2 4 4   [facility] [place] [address] [phone] 2 1 4 3   [facility] [address] [place] [phone] 3 3 3 3           [address] (no [facility]) [address], [place] [phone] 4 4 5 5   [facility] (No Humana) [address] [phone] 2 2 4 4   [facility] [address] [place] [phone] 2 1 2 1   [facility] (No UHC) [address], [place] [phone] 5 5 5 5   [facility] [address] [place] [phone] 2 3 5 5   [facility] [address] [place] [phone] 2 1 1 1   [facility] [address] [place] [phone] 5 2 5 5   [facility]  [address], [place] [phone] 2 2 2  2  [facility] [address] [place] [phone] 4 2 1 1   [facility] [address] [place], [place] [phone] 2 2 3 3           [facility] [address] [place] [phone] 1 1 1 1   [facility] [address], [place]  [phone] 2 3 3 3   [facility] (No Humana) [address] [place], [place] [phone] 2 1 3 2   [facility]) [address], [place] [phone] 1 1 1 1   [facility] [address] [place] [phone] [facility] [place] [address], [place] [phone] 2 1 1 1           [facility] [address] [place], [place] [phone] 4 4 5 5   [facility])   [address], [place] [phone] 2 1 2 1   [facility] [address], [place] [phone]  1 4 3   [facility] [address], [place] [phone] 3 5 4 5   [address] [place], [place] [phone] 3 2 2 2   [name]) [address] [place] [phone] 2 1 3 2      Expected Discharge Plan: Skilled Nursing Facility Barriers to Discharge: Continued Medical Work up   Patient Goals and CMS Choice Patient states their goals for this hospitalization and ongoing recovery are:: Rehab CMS Medicare.gov Compare Post Acute Care list provided to:: Patient Choice offered to / list presented to : Patient [place] ownership interest in [facility].provided to:: Patient    Expected Discharge Plan and Services In-house Referral: Clinical Social Work     Living arrangements for the past 2 months: Apartment                                      Prior Living Arrangements/Services Living arrangements for the past 2 months: Apartment Lives with:: Self Patient language and need for interpreter reviewed:: Yes Do you feel safe going back to the place where you live?: Yes      Need for Family Participation in Patient Care: Yes (Comment) Care giver support system in place?: Yes (comment)   Criminal Activity/Legal Involvement Pertinent to Current Situation/Hospitalization: No - Comment as needed  Activities of Daily Living Home Assistive Devices/Equipment: Environmental consultant (specify type) [employer] wheel) ADL Screening (condition at time of admission) Patient's cognitive ability adequate to safely complete daily activities?: Yes Is the patient deaf or have difficulty hearing?: No Does the patient have difficulty seeing, even when wearing glasses/contacts?: No Does the patient have difficulty concentrating, remembering, or making decisions?: Yes Patient able to express need for assistance with ADLs?: Yes Does the  patient have difficulty dressing or bathing?: Yes Independently performs ADLs?: Yes (appropriate for developmental age) Does the patient have difficulty walking or climbing stairs?: Yes Weakness of Legs: Both Weakness of Arms/Hands: Both  Permission Sought/Granted Permission sought to share information with : Facility Industrial/product designer granted to share information with : Yes, Verbal Permission Granted     Permission granted to share info w AGENCY: SNFs        Emotional Assessment Appearance:: Appears stated age Attitude/Demeanor/Rapport: Engaged Affect (typically observed): Accepting, Appropriate Orientation: : Oriented to Self, Oriented to Place, Oriented to  Time, Oriented to Situation Alcohol / Substance Use: Not Applicable Psych Involvement: No (comment)  Admission diagnosis:  Falls frequently [  R29.6] Acute cystitis without hematuria [N30.00] Generalized weakness [R53.1] Multiple falls [R29.6] Recurrent falls [R29.6] Patient Active Problem List   Diagnosis Date Noted   UTI (urinary tract infection) 09/05/2022   Anxiety and depression 09/05/2022   Falls frequently 09/05/2022   Recurrent falls 09/05/2022   Fracture 04/09/2021   Fall at home, initial encounter 04/08/2021   Chronic systolic CHF (congestive heart failure) (HCC) 04/08/2021   Pubic ramus fracture (HCC) 04/07/2021   Moderate episode of recurrent major depressive disorder (HCC) 03/23/2021   Non-pressure chronic ulcer of left ankle with fat layer exposed (HCC) 01/01/2021   Persistent atrial fibrillation (HCC)    Acute on chronic congestive heart failure (HCC) 04/28/2020   Incisional hernia 09/07/2018   Coronary atherosclerosis due to calcified coronary lesion of native artery 12/09/2017   Anticoagulated 11/07/2015   Lumbago 08/20/2015   Kyphosis 08/20/2015   Insomnia 08/20/2015   Aortic atherosclerosis (HCC) 08/20/2015   Abdominal bruit 08/20/2015   Aortic ectasia (HCC) 08/20/2015   AAA  (abdominal aortic aneurysm) without rupture (HCC) 08/20/2015   B12 deficiency 05/21/2015   HTN (hypertension) 11/19/2014   Cardiomyopathy, dilated, nonischemic (HCC) 07/18/2014   Biventricular cardiac pacemaker in situ 04/24/2014   Chronic cough 04/17/2014   Aneurysm of iliac artery (HCC) 04/17/2014   Incisional hernia, without obstruction or gangrene 11/21/2013   Frequency of urination 11/21/2013   Malaise and fatigue 05/08/2013   Raynaud disease 05/08/2013   Moderate malnutrition (HCC) 12/24/2012   Hypothyroidism    Hair thinning    Anxiety disorder    Hyperlipidemia    Sinusitis, chronic    Chronic bronchitis (HCC)    Depression    Restless legs syndrome (RLS)    PVC (premature ventricular contraction) 06/08/2011   GERD 02/03/2009   Acute on chronic systolic CHF (congestive heart failure) (HCC) 12/12/2008   BARRETT'S ESOPHAGUS 05/29/2008   Gastritis and gastroduodenitis 05/29/2008   PCP:  Frederica Kuster, MD Pharmacy:   [id number] [place], [place] - [address] [place] [address] Phone: [phone] Fax: [phone]  [facility] [place], [name] - [address] [facility] AT Portal to Registered [facility] Sites [address] [name] [address] Phone: [phone] Fax: [phone]     Social Determinants of Health (SDOH) Social History: SDOH Screenings   Food Insecurity: No Food Insecurity (09/05/2022)  Housing: Low Risk  (09/05/2022)  Transportation Needs: No Transportation Needs (09/05/2022)  Utilities: At Risk (09/05/2022)  Alcohol Screen: Low Risk  (04/20/2018)  Depression (PHQ2-9): Low Risk  (12/09/2021)  Tobacco Use: Medium Risk (09/05/2022)   SDOH Interventions: Utilities Interventions: Inpatient TOC   Readmission Risk Interventions     No data to display

## 2022-09-07 DIAGNOSIS — I5022 Chronic systolic (congestive) heart failure: Secondary | ICD-10-CM

## 2022-09-07 DIAGNOSIS — I4891 Unspecified atrial fibrillation: Secondary | ICD-10-CM | POA: Diagnosis not present

## 2022-09-07 DIAGNOSIS — R296 Repeated falls: Secondary | ICD-10-CM | POA: Diagnosis not present

## 2022-09-07 LAB — CBC WITH DIFFERENTIAL/PLATELET
Abs Immature Granulocytes: 0.02 10*3/uL (ref 0.00–0.07)
Basophils Absolute: 0 10*3/uL (ref 0.0–0.1)
Basophils Relative: 0 %
Eosinophils Absolute: 0.5 10*3/uL (ref 0.0–0.5)
Eosinophils Relative: 6 %
HCT: 31.2 % — ABNORMAL LOW (ref 36.0–46.0)
Hemoglobin: 9.8 g/dL — ABNORMAL LOW (ref 12.0–15.0)
Immature Granulocytes: 0 %
Lymphocytes Relative: 18 %
Lymphs Abs: 1.4 10*3/uL (ref 0.7–4.0)
MCH: 28.6 pg (ref 26.0–34.0)
MCHC: 31.4 g/dL (ref 30.0–36.0)
MCV: 91 fL (ref 80.0–100.0)
Monocytes Absolute: 0.9 10*3/uL (ref 0.1–1.0)
Monocytes Relative: 12 %
Neutro Abs: 5 10*3/uL (ref 1.7–7.7)
Neutrophils Relative %: 64 %
Platelets: 224 10*3/uL (ref 150–400)
RBC: 3.43 MIL/uL — ABNORMAL LOW (ref 3.87–5.11)
RDW: 15.3 % (ref 11.5–15.5)
WBC: 7.8 10*3/uL (ref 4.0–10.5)
nRBC: 0 % (ref 0.0–0.2)

## 2022-09-07 LAB — COMPREHENSIVE METABOLIC PANEL
ALT: 51 U/L — ABNORMAL HIGH (ref 0–44)
AST: 34 U/L (ref 15–41)
Albumin: 2.6 g/dL — ABNORMAL LOW (ref 3.5–5.0)
Alkaline Phosphatase: 80 U/L (ref 38–126)
Anion gap: 8 (ref 5–15)
BUN: 29 mg/dL — ABNORMAL HIGH (ref 8–23)
CO2: 16 mmol/L — ABNORMAL LOW (ref 22–32)
Calcium: 8 mg/dL — ABNORMAL LOW (ref 8.9–10.3)
Chloride: 116 mmol/L — ABNORMAL HIGH (ref 98–111)
Creatinine, Ser: 0.93 mg/dL (ref 0.44–1.00)
GFR, Estimated: 59 mL/min — ABNORMAL LOW (ref >60)
Glucose, Bld: 108 mg/dL — ABNORMAL HIGH (ref 70–99)
Potassium: 3.5 mmol/L (ref 3.5–5.1)
Sodium: 140 mmol/L (ref 135–145)
Total Bilirubin: 0.7 mg/dL (ref 0.3–1.2)
Total Protein: 5 g/dL — ABNORMAL LOW (ref 6.5–8.1)

## 2022-09-07 LAB — CULTURE, BLOOD (ROUTINE X 2)

## 2022-09-07 LAB — MAGNESIUM: Magnesium: 2 mg/dL (ref 1.7–2.4)

## 2022-09-07 LAB — GLUCOSE, CAPILLARY
Glucose-Capillary: 128 mg/dL — ABNORMAL HIGH (ref 70–99)
Glucose-Capillary: 116 mg/dL — ABNORMAL HIGH (ref 70–99)
Glucose-Capillary: 108 mg/dL — ABNORMAL HIGH (ref 70–99)
Glucose-Capillary: 158 mg/dL — ABNORMAL HIGH (ref 70–99)

## 2022-09-07 MED ORDER — DIGOXIN 125 MCG PO TABS
0.1250 mg | ORAL_TABLET | Freq: Once | ORAL | Status: AC
  Administered 2022-09-07: 0.125 mg via ORAL
  Filled 2022-09-07: qty 1

## 2022-09-07 MED ORDER — MEGESTROL ACETATE 400 MG/10ML PO SUSP
400.0000 mg | Freq: Every day | ORAL | Status: DC
  Administered 2022-09-07 – 2022-09-10 (×4): 400 mg via ORAL
  Filled 2022-09-07 (×4): qty 10

## 2022-09-07 MED ORDER — MIDODRINE HCL 5 MG PO TABS
5.0000 mg | ORAL_TABLET | Freq: Three times a day (TID) | ORAL | Status: DC
  Administered 2022-09-07 – 2022-09-08 (×2): 5 mg via ORAL
  Filled 2022-09-07 (×2): qty 1

## 2022-09-07 NOTE — TOC Progression Note (Signed)
Transition of Care [facility]) - Progression Note    Patient Details  Name: [name] MRN: [id number] Date of Birth: 1932-03-26  [facility]) CM/SW Contact  Delilah Shan, LCSWA Phone Number: 09/07/2022, 2:03 PM  Clinical Narrative:     CSW spoke with patient at bedside. CSW provided patient with medicare compare SNF ratings list with accepted SNF bed offers. Patient accepted SNF bed offer with [facility]. Patient confirmed her current plan is to go SNF at dc,but expressed she may change her mind on plan if she feels she is stronger and ambulating better when medically ready for discharge. All questions answered. No further questions reported at this time. CSW spoke with Brittney with [facility] who confirmed SNF bed for patient. CSW will continue to follow and assist with patients dc planning needs.  Expected Discharge Plan: Skilled Nursing Facility Barriers to Discharge: Continued Medical Work up  Expected Discharge Plan and Services In-house Referral: Clinical Social Work     Living arrangements for the past 2 months: Apartment                                       Social Determinants of Health (SDOH) Interventions SDOH Screenings   Food Insecurity: No Food Insecurity (09/05/2022)  Housing: Low Risk  (09/05/2022)  Transportation Needs: No Transportation Needs (09/05/2022)  Utilities: At Risk (09/05/2022)  Alcohol Screen: Low Risk  (04/20/2018)  Depression (PHQ2-9): Low Risk  (12/09/2021)  Tobacco Use: Medium Risk (09/05/2022)    Readmission Risk Interventions     No data to display

## 2022-09-07 NOTE — Progress Notes (Signed)
Nutrition Brief Note  Visited patient at bedside who reports she has not had an appetite since the death of her boyfriend in 10-02-2014. She reports gradual weight loss as a result. Patient c/o severe weakness in lower extremities. She reports that she eats about 2 light meals per day and make drink an Ensure sometimes. Patient reports having an "eggwich" with eggs as a bread replacement, [place] sausage and cheese for breakfast and frozen meal like a Lean Cuisine for dinner. She reports not being hungry for lunch ever. Patient lives by herself and has not been able to drive due to recent functional decline and states she enjoys fast food very much but has not been able to get any.   Patient is unsure of her UBW but believes it to be around 125#. Patient reports that she was started on antidepressant since admission, however, RD does not see one listed in meds list.   RD reached out to MD about appetite stimulant who is agreeable.   RD encouraged patient to eat as much as she can as she is on a calorie count for the next 48hrs. Nursing started collecting meal tickets after breakfast this morning. RD to follow up tomorrow for day 1 calorie count results.   Leodis Rains, RDN, LDN  Clinical Nutrition

## 2022-09-07 NOTE — Progress Notes (Addendum)
Patient Name: [name] Date of Encounter: 09/07/2022 [facility] Cardiologist: Peter Swaz[place], MD   Interval Summary  .    Patient mentating well this morning. She is responsive to questions and engaged in exam. Denies focal symptoms this morning including chest pain, shortness of breath, palpitations.   Vital Signs .    Vitals:   09/06/22 2159 09/07/22 0042 09/07/22 0456 09/07/22 0809  BP: 106/83 100/81 107/84 107/80  Pulse: (!) 116 99 95 (!) 113  Resp: 20 19 17 19   Temp:  98.2 F (36.8 C) 97.7 F (36.5 C) 98 F (36.7 C)  TempSrc:  Oral Oral Oral  SpO2: 95% 93% 94% 94%  Weight:      Height:        Intake/Output Summary (Last 24 hours) at 09/07/2022 0841 Last data filed at 09/07/2022 0730 Gross per 24 hour  Intake 760 ml  Output 300 ml  Net 460 ml      09/05/2022    5:16 AM 09/04/2022   11:36 PM 02/03/2022    3:51 PM  Last 3 Weights  Weight (lbs) 127 lb 10.3 oz 125 lb 118 lb 3.2 oz  Weight (kg) 57.9 kg 56.7 kg 53.615 kg      Telemetry/ECG    Atrial fibrillation with LBBB morphology. Ventricular rates low 100s- Personally Reviewed  Physical Exam .   GEN: No acute distress.   Neck: JVP appears elevated to near mandible Cardiac: Irregularly irregular, no murmurs, rubs, or gallops.  Respiratory: bilateral bases diminished GI: Soft, nontender, non-distended  MS: No edema  Assessment & Plan .     Recurrent atrial fibrillation with RVR S/P BiV-PPM   Patient admitted with frequent falls, found with likely near persistent atrial fibrillation/flutter over the last 2-3 days per device interrogation in the setting of UTI. Previous device check in February with less than 1% afib burden. Reportedly this is when fall frequency increased.    Patient remains in afib this morning, ventricular rates into the 120s with soft BP. TTE yesterday with LVEF down to 25-30%. Patient previously on Amiodarone, stopped in August of 2023 due to concern of side effects. At  this time, there are 3 potential next steps: Pursue rate control with medication. At this time, Toprol dose is limited by BP. Digoxin could provide rate control without hypotension but not ideal long term medication. Consider AVN ablation with PPM in place Revisit risks vs benefits of OAC and consider restarting with subsequent TEE/DCCV, perhaps on AAD. This would be risky at current time with significant pulmonary hypertension/severe TR.  Of above options, trial of Digoxin appears to be lowest risk. Discussed dosing with pharmacy today, will order 0.125 mg dose this morning and assess response. Maintenance dose would likely need to be 0.0625 mg given patient age. Will need to closely follow ECG with baseline LBBB.   Chronic HFrEF Hx NICM   Patient with hx EF 20% in 2006 with no CAD. Received BiV-ICD at that time. Subsequent recovery of LVEF until March 2022 when she had decrease back to 25-30%, felt to be afib driven. Last echo later in August 2022 with LVEF 45-50%.    BNP 881.5. TTE shows LVEF has decreased, now 25-30% with global hypokinesis. 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 This echo also shows severely elevated pulmonary artery pressure, estimated 65.6mmHg with severe TR and moderate MR. IVC dilated with less than 50% respiratory variability. Suspect patient has been over-resuscitated with IV fluids and  would like to diurese today if BP allows. Correlated with elevated JVP.  Continue to hold home Losartan 25mg  with low BP.  No SGLT2i with UTI  Recurrent falls Acute encephalopathy with UTI   Patient with increase in fall frequency since February. When fall frequency initially increased, patient had low afib burden per device. Interrogation this admission does show an increase in frequency starting about 4 days ago. Falls would not seem to be specifically caused by arrhythmia.    Management per primary team Would avoid anticoagulation at current time  given anemia and falls.   Small pericardial effusion  Patient with small circumferential effusion on TTE. Will need repeat TTE in outpatient setting.   Iron deficiency anemia   Patient with HBG down to 8.7 this morning with low iron, TIBC, and iron sat. Need to consider IV iron vs PO replacement. Management per primary team.  For questions or updates, please contact [facility] Please consult [email or web address] for contact info under    Signed, Perlie Gold, PA-C   Patient seen and examined, note reviewed with the signed Advanced Practice Provider. I personally reviewed laboratory data, imaging studies and relevant notes. I independently examined the patient and formulated the important aspects of the plan. I have personally discussed the plan with the patient and/or family. Comments or changes to the note/plan are indicated below.   Patient seen examined her bedside.  She is awake when I arrived.  Telemetry still with atrial fibrillation with rapid ventricular rate.  GEN:  Well nourished, well developed in no acute distress HEENT: Mucous membranes moist, good dentition NECK: Noted significant JVD; No carotid bruits LYMPHATICS: No lymphadenopathy CARDIAC: S1S2 noted, RRR, no murmurs, rubs, gallops RESPIRATORY:  Clear to auscultation without rales, wheezing or rhonchi  ABDOMEN: Soft, non-tender, non-distended, bowel sounds noted, no guarding EXTREMITIES:No cyanosis, no cyanosis, no clubbing MUSCULOSKELETAL: No deformity  SKIN: Warm and dry NEUROLOGIC:  Alert and oriented x 3, nonfocal PSYCHIATRIC:  Normal affect, good insight   Recurrent paroxysmal atrial fibrillation with rapid ventricular rate Status post CRT-P Chronic heart failure with reduced ejection fraction most recent echo showed EF to 25 to 30% History of nonischemic cardiomyopathy-tachycardia mediated due to uncontrolled atrial fibrillation Recurrent falls Acute encephalopathy due to recent UTI Small  pericardial fusion Iron deficiency anemia  Clinically volume overloaded with atrial fibrillation with rapid ventricular rate.  She is on low-dose beta-blocker unable to increase due to low systolic blood pressure.  Agree with one-time dose of digoxin for now. Will give the patient midodrine to help with improving her blood pressure , will need to optimize her blood pressure to be able to maintain the support for optimizing guideline directed medical therapy as well as diuretics.  Clinically she is in fluid overload state with severe TR. I am hoping we can start cautious diuretics on this patient. In terms of her paroxysmal atrial fibrillation, I still agree that risk and benefit should be considered in terms of her anticoagulation and for now keep her off of it.  Once we are able to diurese her home hoping that with being euvolemic and on rate control we might be able to control her heart rate.    If we can achieve this then consideration for discussion with EP to consider AV node ablation and likely upgrading her device to CRT-D.  Further medication therapy in terms of guideline medical therapy for her nonischemic cardiomyopathy she is on losartan at home once blood pressure can tolerate we will transition to  Entresto, recent UTI we will hold off on starting Farxiga and spironolactone as well.   Thomasene Ripple DO, MS [facility] Attending Cardiologist [facility]  [address] [place], [place] [address] [phone] Website: [email or web address]

## 2022-09-07 NOTE — Progress Notes (Signed)
Heart Failure Navigator Progress Note  Assessed for Heart & Vascular TOC clinic readiness.  Patient with a scheduled [facility] appointment on 09/30/2022. Per note heart failure from atrial fibrillation with RVR. .   Navigator available for reassessment of patient.   Rhae Hammock, BSN, Scientist, clinical (histocompatibility and immunogenetics) Only

## 2022-09-07 NOTE — Progress Notes (Addendum)
Occupational Therapy Treatment Patient Details Name: [name] MRN: [id number] DOB: Dec 11, 1932 Today's Date: 09/07/2022   History of present illness 89 y.o. female presents to [facility] hospital on 09/04/2022 after a fall. Pt reports progressive fatigue since February with frequent falls. PMH includes: AAA, anxiety, CHF, HTN, pacemaker, PNA.   OT comments  Pt progressing towards goals, needs increased encouragement to participate/mobilize OOB. Pt able to complete seated grooming task at set up A. Mod A +2 for step pivot transfer to chair and mod A for bed mobility. Pt presenting with impairments listed below, will follow acutely. Patient will benefit from continued inpatient follow up therapy, <3 hours/day to maximize safety/ind with ADLs/functional mobility.    Recommendations for follow up therapy are one component of a multi-disciplinary discharge planning process, led by the attending physician.  Recommendations may be updated based on patient status, additional functional criteria and insurance authorization.    Assistance Recommended at Discharge Frequent or constant Supervision/Assistance  Patient can return home with the following  Two people to help with walking and/or transfers;A lot of help with bathing/dressing/bathroom;Assistance with cooking/housework;Direct supervision/assist for medications management;Direct supervision/assist for financial management;Assist for transportation;Help with stairs or ramp for entrance   Equipment Recommendations  Other (comment) (defer)    Recommendations for Other Services PT consult    Precautions / Restrictions Precautions Precautions: Fall Restrictions Weight Bearing Restrictions: No       Mobility Bed Mobility Overal bed mobility: Needs Assistance Bed Mobility: Supine to Sit, Sit to Supine     Supine to sit: Mod assist          Transfers Overall transfer level: Needs assistance Equipment used: Rolling walker (2 wheels), 2  person hand held assist Transfers: Sit to/from Stand, Bed to chair/wheelchair/BSC Sit to Stand: Min assist, +2 safety/equipment, From elevated surface     Step pivot transfers: Mod assist, +2 physical assistance           Balance Overall balance assessment: Needs assistance Sitting-balance support: No upper extremity supported, Feet supported Sitting balance-Leahy Scale: Fair Sitting balance - Comments: can reach minimally outside BOS without LOB   Standing balance support: Bilateral upper extremity supported, Reliant on assistive device for balance Standing balance-Leahy Scale: Poor Standing balance comment: reliant on external support and with use of RW                           ADL either performed or assessed with clinical judgement   ADL Overall ADL's : Needs assistance/impaired     Grooming: Set up;Sitting Grooming Details (indicate cue type and reason): applying makeup sitting EOB                 Toilet Transfer: +2 for physical assistance;Moderate assistance           Functional mobility during ADLs: Moderate assistance;+2 for physical assistance      Extremity/Trunk Assessment Upper Extremity Assessment Upper Extremity Assessment: Generalized weakness   Lower Extremity Assessment Lower Extremity Assessment: Generalized weakness        Vision   Vision Assessment?: No apparent visual deficits   Perception Perception Perception: Not tested   Praxis Praxis Praxis: Not tested    Cognition Arousal/Alertness: Awake/alert Behavior During Therapy: WFL for tasks assessed/performed Overall Cognitive Status: Impaired/Different from baseline Area of Impairment: Awareness, Safety/judgement, Following commands, Memory, Attention, Problem solving                   Current  Attention Level: Focused Memory: Decreased short-term memory Following Commands: Follows one step commands with increased time Safety/Judgement: Decreased  awareness of deficits Awareness: Anticipatory Problem Solving: Slow processing, Requires verbal cues, Difficulty sequencing General Comments: more alert compared to previous session        Exercises      Shoulder Instructions       General Comments VSSo n RA    Pertinent Vitals/ Pain       Pain Assessment Pain Assessment: No/denies pain  Home Living                                          Prior Functioning/Environment              Frequency  Min 1X/week        Progress Toward Goals  OT Goals(current goals can now be found in the care plan section)  Progress towards OT goals: Progressing toward goals  Acute Rehab OT Goals Patient Stated Goal: none stated OT Goal Formulation: With patient Time For Goal Achievement: 09/19/22 Potential to Achieve Goals: Good ADL Goals Pt Will Perform Upper Body Dressing: sitting;with min assist Pt Will Perform Lower Body Dressing: sitting/lateral leans;sit to/from stand;with min assist Pt Will Transfer to Toilet: with min assist;ambulating;regular height toilet Additional ADL Goal #1: pt will perform bed mobility min guard A in prep for ADLs  Plan Discharge plan remains appropriate;Frequency remains appropriate    Co-evaluation                 AM-PAC OT "6 Clicks" Daily Activity     Outcome Measure   Help from another person eating meals?: A Little Help from another person taking care of personal grooming?: A Little Help from another person toileting, which includes using toliet, bedpan, or urinal?: A Lot Help from another person bathing (including washing, rinsing, drying)?: A Lot Help from another person to put on and taking off regular upper body clothing?: A Lot Help from another person to put on and taking off regular lower body clothing?: A Lot 6 Click Score: 14    End of Session Equipment Utilized During Treatment: Gait belt;Rolling walker (2 wheels)  OT Visit Diagnosis: Unsteadiness  on feet (R26.81);Other abnormalities of gait and mobility (R26.89);Muscle weakness (generalized) (M62.81);History of falling (Z91.81);Repeated falls (R29.6)   Activity Tolerance Patient tolerated treatment well   Patient Left In chair, with call bell/phone in reach, with chair alarm set   Nurse Communication Mobility status        Time: 1610-9604 OT Time Calculation (min): 41 min  Charges: OT General Charges $OT Visit: 1 Visit OT Treatments $Self Care/Home Management : 38-52 mins  Carver Fila, OTD, OTR/L SecureChat Preferred Acute Rehab [phone]   Carver Fila Koonce 09/07/2022, 12:38 PM

## 2022-09-07 NOTE — Progress Notes (Signed)
PT note Orthostatic BPs  Supine 97/74, 120 bpm  Sitting 107/68, 114 bpm  Standing 101/74, 137 bpm  Standing after 3 min 83/54, 109 bpm  End session supine in bed, 96/74, 120 bpm.  Full note to follow.  Teshia Mahone M,PT Acute Rehab Services [phone]

## 2022-09-07 NOTE — Progress Notes (Signed)
Physical Therapy Treatment Patient Details Name: [name] MRN: [id number] DOB: 04-26-1932 Today's Date: 09/07/2022   History of Present Illness 89 y.o. female presents to [facility] hospital on 09/04/2022 after a fall. Pt reports progressive fatigue since February with frequent falls. PMH includes: AAA, anxiety, CHF, HTN, pacemaker, PNA.    PT Comments  Pt admitted with above diagnosis. Pt progressing with better ability to stand and was able to take a few steps today. Pt still has orthostasis and would benefit from TED hose if MD could order.  Pt fatigues quickly with minimal activity and continues to need short term Rehab for endurance training.  Pt currently with functional limitations due to the deficits listed below (see PT Problem List). Pt will benefit from acute skilled PT to increase their independence and safety with mobility to allow discharge.       Orthostatic BPs   Supine 97/74, 120 bpm  Sitting 107/68, 114 bpm  Standing 101/74, 137 bpm  Standing after 3 min 83/54, 109 bpm  End session supine in bed, 96/74, 120 bpm.  Assistance Recommended at Discharge Frequent or constant Supervision/Assistance  If plan is discharge home, recommend the following:  Can travel by private vehicle    A lot of help with walking and/or transfers;A lot of help with bathing/dressing/bathroom;Assistance with cooking/housework;Direct supervision/assist for medications management;Assist for transportation;Direct supervision/assist for financial management;Help with stairs or ramp for entrance   No  Equipment Recommendations   (defer to post-acute)    Recommendations for Other Services       Precautions / Restrictions Precautions Precautions: Fall Restrictions Weight Bearing Restrictions: No     Mobility  Bed Mobility Overal bed mobility: Needs Assistance Bed Mobility: Supine to Sit, Sit to Supine     Supine to sit: Mod assist Sit to supine: Min assist   General bed mobility comments:  assist for trunk elevation OOB, assist for BLE elevation into bed    Transfers Overall transfer level: Needs assistance Equipment used: Rolling walker (2 wheels), 2 person hand held assist Transfers: Sit to/from Stand, Bed to chair/wheelchair/BSC Sit to Stand: Min assist, +2 safety/equipment, From elevated surface           General transfer comment: min A +2   for power up to RW with use of RW, assist to manage RW and cue step sequencing, pt able to march in place. Keeps bil knees flexed and cannot straighten them to command with left knee with greater flexion than right LE.    Ambulation/Gait Ambulation/Gait assistance: Min assist, +2 safety/equipment Gait Distance (Feet): 3 Feet Assistive device: Rolling walker (2 wheels) Gait Pattern/deviations: Step-to pattern, Shuffle Gait velocity: reduced Gait velocity interpretation: <1.31 ft/sec, indicative of household ambulator   General Gait Details: minimal foot clearance bilaterally. Pt able to take a few steps forward and then back to bed.  Pt steady with use of rW.  reports fatigue and wants to sit down after this activity and needed assist to control descent onto bed which pt states she had difficulty with at home.   Stairs             Wheelchair Mobility     Tilt Bed    Modified Rankin (Stroke Patients Only)       Balance Overall balance assessment: Needs assistance Sitting-balance support: No upper extremity supported, Feet supported Sitting balance-Leahy Scale: Fair Sitting balance - Comments: can reach minimally outside BOS without LOB   Standing balance support: Bilateral upper extremity supported, Reliant on assistive  device for balance Standing balance-Leahy Scale: Poor Standing balance comment: reliant on external support and with use of RW                            Cognition Arousal/Alertness: Awake/alert Behavior During Therapy: WFL for tasks assessed/performed Overall Cognitive Status:  Impaired/Different from baseline Area of Impairment: Awareness, Safety/judgement, Following commands, Memory, Attention, Problem solving                   Current Attention Level: Focused Memory: Decreased short-term memory Following Commands: Follows one step commands with increased time Safety/Judgement: Decreased awareness of deficits Awareness: Anticipatory Problem Solving: Slow processing, Requires verbal cues, Difficulty sequencing General Comments: cues throughout for step sequencing and repetition of instructions, pt aware she is at the hospital        Exercises General Exercises - Lower Extremity Ankle Circles/Pumps: AROM, Both, 10 reps, Supine Quad Sets: AROM, Both, 10 reps, Supine Gluteal Sets: AROM, Both, 10 reps, Supine Long Arc Quad: AROM, Both, 10 reps, Seated    General Comments        Pertinent Vitals/Pain Pain Assessment Pain Assessment: No/denies pain Breathing: normal Negative Vocalization: none Facial Expression: smiling or inexpressive Body Language: relaxed Consolability: no need to console PAINAD Score: 0    Home Living                          Prior Function            PT Goals (current goals can now be found in the care plan section) Acute Rehab PT Goals Patient Stated Goal: to regain strength, return to independence Progress towards PT goals: Progressing toward goals    Frequency    Min 3X/week      PT Plan Current plan remains appropriate    Co-evaluation              AM-PAC PT "6 Clicks" Mobility   Outcome Measure  Help needed turning from your back to your side while in a flat bed without using bedrails?: A Lot Help needed moving from lying on your back to sitting on the side of a flat bed without using bedrails?: A Lot Help needed moving to and from a bed to a chair (including a wheelchair)?: A Lot Help needed standing up from a chair using your arms (e.g., wheelchair or bedside chair)?: A Lot Help  needed to walk in hospital room?: Total Help needed climbing 3-5 steps with a railing? : Total 6 Click Score: 10    End of Session Equipment Utilized During Treatment: Gait belt Activity Tolerance: Patient limited by fatigue Patient left: with call bell/phone within reach;in bed;with bed alarm set Nurse Communication: Mobility status;Need for lift equipment Antony Salmon) PT Visit Diagnosis: Other abnormalities of gait and mobility (R26.89);Muscle weakness (generalized) (M62.81)     Time: 1610-9604 PT Time Calculation (min) (ACUTE ONLY): 24 min  Charges:    $Therapeutic Exercise: 8-22 mins $Therapeutic Activity: 8-22 mins PT General Charges $$ ACUTE PT VISIT: 1 Visit                     Sherlon Nied M,PT Acute Rehab Services [phone]    Bevelyn Buckles 09/07/2022, 9:13 AM

## 2022-09-07 NOTE — Progress Notes (Signed)
PROGRESS NOTE  [name] ZOX:[id number] DOB: Jun 25, 1932 DOA: 09/04/2022 PCP: Frederica Kuster, MD  Brief History   The patient is a 89 yr old woman with a past medical history significant for cardiomyopathy status post BiV/ICD, hypertension, atrial fibrillation, hypothyroidism, melanoma, anxiety and depression. Patient was admitted with frequent falls and altered mentation. She presented from home with lethargy and was not able to communicate. CT head and MRI brain were negative for abnormality. She did appear to be volume depleted. She was also found to be in atrial fibrillation with RVR.   She was admitted to a telemetry bed. Cardiology was consulted. They are attempting to control her rate with metoprolol and low dose digoxin. Options for controlling her rate are limited by her low blood pressure and her COPD.   The patient is now awake and alert, but she is complaining of bilateral lower extremity weakness. When heart rate is controlled, PT/OT will be consulted to evaluate the patient's lower extremity weakness.  Echocardiogram was performed and demonstrated EF of 25-30%. The left ventricle demonstrated global hypokinesis. Diastolic function could not be evaluated. RV systolic function is mildly reduced. She has severely elevated pulmonary artery systolic pressure at 65.7 mmHg. There was a small pericardial effusion.   Interval History/Subjective  The patient is resting comfortably. She continues to complain of lower extremity weakness bilaterally. Await control of her heart rate for PT consult to further evaluate.  Objective   Vitals:  Vitals:   09/07/22 1700 09/07/22 1720  BP:    Pulse:  (!) 102  Resp: 18   Temp:    SpO2: 93%     Exam:  Constitutional:  The patient is awake, alert, and oriented x 3. No acute distress. Respiratory:  No increased work of breathing. No wheezes, rales, or rhonchi No tactile fremitus Cardiovascular:  Regular rate and rhythm No murmurs,  ectopy, or gallups. No lateral PMI. No thrills. Abdomen:  Abdomen is soft, non-tender, non-distended No hernias, masses, or organomegaly Normoactive bowel sounds.  Musculoskeletal:  No cyanosis, clubbing, or edema Skin:  No rashes, lesions, ulcers palpation of skin: no induration or nodules Neurologic:  CN 2-12 intact Sensation all 4 extremities intact Psychiatric:  Mental status Mood, affect appropriate Orientation to person, place, time     I have personally reviewed the following:     Scheduled Meds:  feeding supplement  237 mL Oral BID BM   levothyroxine  100 mcg Oral QAC breakfast   megestrol  400 mg Oral Daily   metoprolol succinate  12.5 mg Oral BID   midodrine  5 mg Oral TID WC   pantoprazole  40 mg Oral Daily   Continuous Infusions:  cefTRIAXone (ROCEPHIN)  IV 1 g (09/07/22 1317)   dextrose 5 % and 0.9 % NaCl Stopped (09/06/22 1437)    Principal Problem:   Falls frequently Active Problems:   Fall at home, initial encounter   Chronic systolic CHF (congestive heart failure) (HCC)   UTI (urinary tract infection)   Anxiety and depression   Recurrent falls   Protein-calorie malnutrition, severe   A & P  Assessment & Plan:   Principal Problem:   Falls frequently Active Problems:   Fall at home, initial encounter   Chronic systolic CHF (congestive heart failure) (HCC)   UTI (urinary tract infection)   Anxiety and depression   Recurrent falls      Frequent falls -PT/OT consult. -Patient may be failing to thrive. -Patient was volume depleted on presentation. -UA  revealed pyuria, but culture grew multiple organisms.  Repeat urine culture. -Patient has been volume replete.  DC IV fluid. -Encourage liberal oral fluid. -Cardiology input is appreciated.   -Based on frequent falls, cardiology is considering discontinuing DOAC.   Possible UTI -Urinalysis revealed pyuria. -However, urine culture grew multiple organisms (likely contaminants). -Repeat  urinalysis and culture. -Patient is on Rocephin   Arrhythmia/tachycardia/cardiomyopathy/ BiV/ICD/HTN  -Cardiology consulted.   -Will defer to the cardiology team.     Subtle irregularity involving the subcapital right femoral neck. -CT right hip revealed: 1. No acute displaced right hip fracture. 2. Old healed fractures of the right superior and inferior pubic rami. 3. Small volume of ascites. 4. Diffuse soft tissue edema. 5. Colonic diverticulosis. 6. Aortic atherosclerosis.   History of atrial fibrillation -Cardiology team is directing care. -Xarelto on hold   Elevated LFTs -Mild, new.   -Unclear etiology  -Repeat CMP in the morning.     Volume depletion: -Patient has been adequately hydrated. -Hold IV fluids.   Chronic systolic CHF (congestive heart failure) (HCC): -Seems compensated. -Cardiology is directing care.     Hypothyroidism -Synthroid resumed   GERD -Aciphex resumed       DVT prophylaxis: SCD. Code Status: DO NOT RESUSCITATE Family Communication:  Disposition Plan: This will depend on hospital course.     Consultants:  Cardiology.   Procedures:  None.   Antimicrobials:  IV Rocephin.   [name] Lewan, DO [facility] Direct contact: see [email or web address]  7PM-7AM contact night coverage as above 09/07/2022, 6:47 PM  LOS: 2 days    LOS: 2 days

## 2022-09-08 DIAGNOSIS — R296 Repeated falls: Secondary | ICD-10-CM | POA: Diagnosis not present

## 2022-09-08 DIAGNOSIS — I4891 Unspecified atrial fibrillation: Secondary | ICD-10-CM | POA: Diagnosis not present

## 2022-09-08 DIAGNOSIS — I5022 Chronic systolic (congestive) heart failure: Secondary | ICD-10-CM | POA: Diagnosis not present

## 2022-09-08 LAB — BASIC METABOLIC PANEL
Anion gap: 7 (ref 5–15)
BUN: 27 mg/dL — ABNORMAL HIGH (ref 8–23)
CO2: 20 mmol/L — ABNORMAL LOW (ref 22–32)
Calcium: 8.4 mg/dL — ABNORMAL LOW (ref 8.9–10.3)
Chloride: 112 mmol/L — ABNORMAL HIGH (ref 98–111)
Creatinine, Ser: 0.98 mg/dL (ref 0.44–1.00)
GFR, Estimated: 56 mL/min — ABNORMAL LOW (ref >60)
Glucose, Bld: 144 mg/dL — ABNORMAL HIGH (ref 70–99)
Potassium: 4 mmol/L (ref 3.5–5.1)
Sodium: 139 mmol/L (ref 135–145)

## 2022-09-08 LAB — CULTURE, OB URINE: Culture: NO GROWTH

## 2022-09-08 LAB — CBC
HCT: 34.1 % — ABNORMAL LOW (ref 36.0–46.0)
Hemoglobin: 10.6 g/dL — ABNORMAL LOW (ref 12.0–15.0)
MCH: 29.3 pg (ref 26.0–34.0)
MCHC: 31.1 g/dL (ref 30.0–36.0)
MCV: 94.2 fL (ref 80.0–100.0)
Platelets: 251 10*3/uL (ref 150–400)
RBC: 3.62 MIL/uL — ABNORMAL LOW (ref 3.87–5.11)
RDW: 15.4 % (ref 11.5–15.5)
WBC: 9.1 10*3/uL (ref 4.0–10.5)
nRBC: 0 % (ref 0.0–0.2)

## 2022-09-08 LAB — GLUCOSE, CAPILLARY
Glucose-Capillary: 103 mg/dL — ABNORMAL HIGH (ref 70–99)
Glucose-Capillary: 121 mg/dL — ABNORMAL HIGH (ref 70–99)
Glucose-Capillary: 137 mg/dL — ABNORMAL HIGH (ref 70–99)
Glucose-Capillary: 135 mg/dL — ABNORMAL HIGH (ref 70–99)

## 2022-09-08 LAB — DIGOXIN LEVEL: Digoxin Level: 1 ng/mL (ref 0.8–2.0)

## 2022-09-08 LAB — CULTURE, BLOOD (ROUTINE X 2)

## 2022-09-08 MED ORDER — MIDODRINE HCL 5 MG PO TABS
10.0000 mg | ORAL_TABLET | Freq: Three times a day (TID) | ORAL | Status: DC
  Administered 2022-09-08 – 2022-09-10 (×8): 10 mg via ORAL
  Filled 2022-09-08 (×8): qty 2

## 2022-09-08 MED ORDER — IRON SUCROSE 500 MG IVPB - SIMPLE MED
500.0000 mg | Freq: Once | INTRAVENOUS | Status: DC
  Filled 2022-09-08: qty 275

## 2022-09-08 MED ORDER — DIGOXIN 125 MCG PO TABS: 0.1250 mg | ORAL_TABLET | Freq: Every day | ORAL | Status: DC

## 2022-09-08 MED ORDER — FUROSEMIDE 10 MG/ML IJ SOLN
20.0000 mg | Freq: Once | INTRAMUSCULAR | Status: AC
  Administered 2022-09-08: 20 mg via INTRAVENOUS
  Filled 2022-09-08: qty 2

## 2022-09-08 MED ORDER — OFF THE BEAT BOOK
Freq: Once | Status: AC
  Filled 2022-09-08: qty 1

## 2022-09-08 MED ORDER — LIVING BETTER WITH HEART FAILURE BOOK: Freq: Once | Status: AC

## 2022-09-08 MED ORDER — DIGOXIN 125 MCG PO TABS
0.1250 mg | ORAL_TABLET | Freq: Every day | ORAL | Status: AC
  Administered 2022-09-08: 0.125 mg via ORAL
  Filled 2022-09-08: qty 1

## 2022-09-08 MED ORDER — IRON SUCROSE 500 MG IVPB - SIMPLE MED
500.0000 mg | Freq: Once | INTRAVENOUS | Status: AC
  Administered 2022-09-08: 500 mg via INTRAVENOUS
  Filled 2022-09-08: qty 500

## 2022-09-08 NOTE — Plan of Care (Signed)

## 2022-09-08 NOTE — Progress Notes (Signed)
PROGRESS NOTE  [name] UJW:[id number] DOB: 09/20/1932 DOA: 09/04/2022 PCP: Frederica Kuster, MD  Brief History   The patient is a 89 yr old woman with a past medical history significant for cardiomyopathy status post BiV/ICD, hypertension, atrial fibrillation, hypothyroidism, melanoma, anxiety and depression. Patient was admitted with frequent falls and altered mentation. She presented from home with lethargy and was not able to communicate. CT head and MRI brain were negative for abnormality. She did appear to be volume depleted. She was also found to be in atrial fibrillation with RVR.   She was admitted to a telemetry bed. Cardiology was consulted. They are attempting to control her rate with metoprolol and low dose digoxin. Options for controlling her rate are limited by her low blood pressure and her COPD.   The patient is now awake and alert, but she is complaining of bilateral lower extremity weakness. When heart rate is controlled, PT/OT will be consulted to evaluate the patient's lower extremity weakness.  Echocardiogram was performed and demonstrated EF of 25-30%. The left ventricle demonstrated global hypokinesis. Diastolic function could not be evaluated. RV systolic function is mildly reduced. She has severely elevated pulmonary artery systolic pressure at 65.7 mmHg. There was a small pericardial effusion.   Interval History/Subjective  The patient is resting comfortably. She continues to complain of lower extremity weakness bilaterally. Await control of her heart rate for PT consult to further evaluate.  Objective   Vitals:  Vitals:   09/08/22 1207 09/08/22 1630  BP: 106/69 111/84  Pulse:  93  Resp:  (!) 22  Temp: 98.1 F (36.7 C) 98 F (36.7 C)  SpO2:  96%    Exam:  Constitutional:  The patient is awake, alert, and oriented x 3. No acute distress. Respiratory:  No increased work of breathing. No wheezes, rales, or rhonchi No tactile  fremitus Cardiovascular:  Regular rate and rhythm No murmurs, ectopy, or gallups. No lateral PMI. No thrills. Abdomen:  Abdomen is soft, non-tender, non-distended No hernias, masses, or organomegaly Normoactive bowel sounds.  Musculoskeletal:  No cyanosis, clubbing, or edema Skin:  No rashes, lesions, ulcers palpation of skin: no induration or nodules Neurologic:  CN 2-12 intact Sensation all 4 extremities intact Psychiatric:  Mental status Mood, affect appropriate Orientation to person, place, time     I have personally reviewed the following:     Scheduled Meds:  feeding supplement  237 mL Oral BID BM   levothyroxine  100 mcg Oral QAC breakfast   megestrol  400 mg Oral Daily   metoprolol succinate  12.5 mg Oral BID   midodrine  10 mg Oral TID WC   pantoprazole  40 mg Oral Daily   Continuous Infusions:  cefTRIAXone (ROCEPHIN)  IV Stopped (09/08/22 1456)    Principal Problem:   Falls frequently Active Problems:   Fall at home, initial encounter   Chronic systolic CHF (congestive heart failure) (HCC)   UTI (urinary tract infection)   Anxiety and depression   Recurrent falls   Protein-calorie malnutrition, severe   A & P  Assessment & Plan:   Principal Problem:   Falls frequently Active Problems:   Fall at home, initial encounter   Chronic systolic CHF (congestive heart failure) (HCC)   UTI (urinary tract infection)   Anxiety and depression   Recurrent falls      Frequent falls -PT/OT consult. -Patient may be failing to thrive. -Patient was volume depleted on presentation. -UA revealed pyuria, but culture grew multiple organisms.  Repeat urine culture. -Patient has been volume replete.  DC IV fluid. -Encourage liberal oral fluid. -Cardiology input is appreciated.   -Based on frequent falls, cardiology is considering discontinuing DOAC.   Possible UTI -Urinalysis revealed pyuria. -However, urine culture grew multiple organisms (likely  contaminants). -Repeat urinalysis and culture. -Patient is on Rocephin   Arrhythmia/tachycardia/cardiomyopathy/ BiV/ICD/HTN  -Cardiology consulted.   -Will defer to the cardiology team.     Subtle irregularity involving the subcapital right femoral neck. -CT right hip revealed: 1. No acute displaced right hip fracture. 2. Old healed fractures of the right superior and inferior pubic rami. 3. Small volume of ascites. 4. Diffuse soft tissue edema. 5. Colonic diverticulosis. 6. Aortic atherosclerosis.   History of atrial fibrillation -Cardiology team is directing care. -Xarelto on hold   Elevated LFTs -Mild, new.   -Unclear etiology  -Repeat CMP in the morning.     Volume depletion: -Patient has been adequately hydrated. -Hold IV fluids.   Chronic systolic CHF (congestive heart failure) (HCC): -Seems compensated. -Cardiology is directing care.     Hypothyroidism -Synthroid resumed   GERD -Aciphex resumed       DVT prophylaxis: SCD. Code Status: DO NOT RESUSCITATE Family Communication:  Disposition Plan: This will depend on hospital course.     Consultants:  Cardiology.   Procedures:  None.   Antimicrobials:  IV Rocephin.   Adreena Willits, DO [facility] Direct contact: see [email or web address]  7PM-7AM contact night coverage as above 09/08/2022, 6:14 PM  LOS: 2 days    LOS: 3 days

## 2022-09-08 NOTE — Progress Notes (Addendum)
Patient Name: [name] Date of Encounter: 09/08/2022 [facility] Cardiologist: Peter [place], MD   Interval Summary  .    Patient sitting in bedside chair this morning. She remains very concerned about leg weakness. Denies chest pain, palpitations. She did have some shortness of breath this morning upon first waking up but now does not have dyspnea.   Vital Signs .    Vitals:   09/07/22 2027 09/08/22 0413 09/08/22 0754 09/08/22 0930  BP: 103/74 109/77 107/72   Pulse: (!) 112 (!) 106  (!) 109  Resp: 20 18 (!) 22   Temp: 97.7 F (36.5 C) 97.6 F (36.4 C) 98.3 F (36.8 C)   TempSrc: Oral Axillary Oral   SpO2: 96% 94%    Weight:      Height:        Intake/Output Summary (Last 24 hours) at 09/08/2022 1026 Last data filed at 09/08/2022 0414 Gross per 24 hour  Intake 360 ml  Output 600 ml  Net -240 ml      09/05/2022    5:16 AM 09/04/2022   11:36 PM 02/03/2022    3:51 PM  Last 3 Weights  Weight (lbs) 127 lb 10.3 oz 125 lb 118 lb 3.2 oz  Weight (kg) 57.9 kg 56.7 kg 53.615 kg      Telemetry/ECG    Persistent atrial fibrillation with RVR into the 130s - Personally Reviewed  Physical Exam .   GEN: No acute distress.   Neck: JVP visible to mandible Cardiac: Irregularly irregular, no murmurs, rubs, or gallops.  Respiratory: Clear to auscultation bilaterally. GI: Soft, nontender, non-distended  MS: No edema  Assessment & Plan .     Recurrent atrial fibrillation with RVR S/P BiV-PPM   Patient admitted with frequent falls, found with likely near persistent atrial fibrillation/flutter over the last 2-3 days per device interrogation in the setting of UTI. Previous device check in February with less than 1% afib burden. Reportedly this is when fall frequency increased.    Patient remains in afib this morning, ventricular rates into the 130s. TTE this admission with LVEF down to 25-30%. Patient previously on Amiodarone, stopped in August of 2023 due to concern  of side effects. At this time, there are 3 potential next steps: Pursue rate control with medication. At this time, Toprol dose is limited by BP. Digoxin given yesterday with minimal impact to HR.  Consider AVN ablation with PPM in place Revisit risks vs benefits of OAC and consider restarting with subsequent TEE/DCCV, perhaps on AAD. This would be risky at current time with significant pulmonary hypertension/severe TR.   Of above, rate control strategy continues to be best option going forward. Will re-dose digoxin at again this morning. Continue Toprol XL 12.5mg  BID. May need to engage EP team this admission.     Chronic HFrEF Hx NICM   Patient with hx EF 20% in 2006 with no CAD. Received BiV-ICD at that time. Subsequent recovery of LVEF until March 2022 when she had decrease back to 25-30%, felt to be afib driven. Last echo later in August 2022 with LVEF 45-50%.    BNP 881.5. TTE shows LVEF has decreased, now 25-30% with global hypokinesis. 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 This echo also shows severely elevated pulmonary artery pressure, estimated 65.31mmHg with severe TR and moderate MR. IVC dilated with less than 50% respiratory variability. Suspect patient has been over-resuscitated with IV fluids and would like to diurese  if BP allows. Correlated with elevated JVP. Per Dr. Servando Salina, Midodrine initiated to help support diuresis. Lasix pending morning BMP. Continue to hold home Losartan 25mg  with low BP.  No SGLT2i with UTI   Recurrent falls Acute encephalopathy with UTI   Patient with increase in fall frequency since February. When fall frequency initially increased, patient had low afib burden per device. Interrogation this admission does show an increase in frequency starting about 4 days ago. Falls would not seem to be specifically caused by arrhythmia.    Management per primary team Would avoid anticoagulation at current time given  anemia and falls.   Small pericardial effusion   Patient with small circumferential effusion on TTE. Will need repeat TTE in outpatient setting.    Iron deficiency anemia   Patient with HBG down to 8.7 this admission with low iron, TIBC, and iron sat. Need to consider IV iron vs PO replacement. Management per primary team. For questions or updates, please contact [facility] Please consult [email or web address] for contact info under    Signed, Thomasene Ripple, DO '  Patient seen and examined, note reviewed with the signed Advanced Practice Provider. I personally reviewed laboratory data, imaging studies and relevant notes. I independently examined the patient and formulated the important aspects of the plan. I have personally discussed the plan with the patient and/or family. Comments or changes to the note/plan are indicated below.  Still with atrial fibrillation rapid ventricular rate, given digoxin yesterday we will repeat the doses today.  Will also get digoxin level.   In terms of her paroxysmal atrial fibrillation, I still agree that risk and benefit should be considered in terms of her anticoagulation and for now keep her off of it.  Once we are able to diurese her home hoping that with being euvolemic and on rate control we might be able to control her heart rate.   If we can not achieve good rate control then this then consideration for discussion with EP to consider AV node ablation and likely upgrading her device to CRT-D.  She still is on low-dose beta-blocker but is limited by her blood pressure. Started midodrine yesterday increase the dose to 10 mg 3 times daily.  Hoping that this will help in terms of the blood pressure.  Plan for cautious diuretics today Lasix 20 mg daily x 1 kidney function normal yesterday.     Further medication therapy in terms of guideline medical therapy for her nonischemic cardiomyopathy she is on losartan at home once blood pressure can tolerate we  will transition to [facility], recent UTI we will hold off on starting Farxiga and spironolactone as well.    Thomasene Ripple DO, MS [facility] Attending Cardiologist [facility]  [address] [place], [place] [address] [phone] Website: [email or web address]

## 2022-09-08 NOTE — Progress Notes (Signed)
Calorie Count Note Day 1  48 hour calorie count ordered.  Diet: Heart Supplements: Ensure Plus High Protein po BID, each supplement provides 350 kcal and 20 grams of protein.   Breakfast: 90% (283 kcal, 18 g protein) Lunch: no lunch tray delivered Dinner: 75% (319 kcal, 19 g protein) Supplements: 2 Ensure Plus High Protein po BID, each supplement provides 350 kcal and 20 grams of protein.   Total intake: 1302 kcal (87% of minimum estimated needs)  77 protein (100% of minimum estimated needs)  Nutrition Dx: severe malnutrition related to chronic illness as evidenced by severe fat and muscle depletion  Goal: patient will meet greater than or equal to 90% of their needs   Intervention: Ensure Plus High Protein po BID, each supplement provides 350 kcal and 20 grams of protein.  Encourage po intake    Leodis Rains, RDN, LDN  Clinical Nutrition

## 2022-09-08 NOTE — TOC Progression Note (Signed)
Transition of Care [facility]) - Progression Note    Patient Details  Name: [name] MRN: [id number] Date of Birth: 1932/08/02  [facility]) CM/SW Contact  Delilah Shan, LCSWA Phone Number: 09/08/2022, 4:29 PM  Clinical Narrative:     Patient has SN[facility] bed at [facility] when medically ready. CSW will continue to follow and assist with patients dc planning needs.  Expected Discharge Plan: Skilled Nursing Facility Barriers to Discharge: Continued Medical Work up  Expected Discharge Plan and Services In-house Referral: Clinical Social Work     Living arrangements for the past 2 months: Apartment                                       Social Determinants of Health (SDOH) Interventions SDOH Screenings   Food Insecurity: No Food Insecurity (09/05/2022)  Housing: Low Risk  (09/05/2022)  Transportation Needs: No Transportation Needs (09/05/2022)  Utilities: At Risk (09/05/2022)  Alcohol Screen: Low Risk  (04/20/2018)  Depression (PHQ2-9): Low Risk  (12/09/2021)  Tobacco Use: Medium Risk (09/05/2022)    Readmission Risk Interventions     No data to display

## 2022-09-08 NOTE — Care Management Important Message (Signed)
Important Message  Patient Details  Name: [name] MRN: [id number] Date of Birth: 12/23/1932   Medicare Important Message Given:  Yes     Renie Ora 09/08/2022, 8:47 AM

## 2022-09-09 DIAGNOSIS — I4891 Unspecified atrial fibrillation: Secondary | ICD-10-CM | POA: Diagnosis not present

## 2022-09-09 DIAGNOSIS — R296 Repeated falls: Secondary | ICD-10-CM | POA: Diagnosis not present

## 2022-09-09 DIAGNOSIS — I5022 Chronic systolic (congestive) heart failure: Secondary | ICD-10-CM | POA: Diagnosis not present

## 2022-09-09 LAB — GLUCOSE, CAPILLARY
Glucose-Capillary: 84 mg/dL (ref 70–99)
Glucose-Capillary: 79 mg/dL (ref 70–99)
Glucose-Capillary: 109 mg/dL — ABNORMAL HIGH (ref 70–99)
Glucose-Capillary: 98 mg/dL (ref 70–99)

## 2022-09-09 NOTE — Progress Notes (Signed)
Mobility Specialist Progress Note:    09/09/22 1308  Mobility  Activity Transferred from bed to chair  Level of Assistance +2 (takes two people)  Press photographer wheel walker  Distance Ambulated (ft) 5 ft  Activity Response Tolerated well  Mobility Referral Yes  $Mobility charge 1 Mobility  Mobility Specialist Start Time (ACUTE ONLY) 1257  Mobility Specialist Stop Time (ACUTE ONLY) 1308  Mobility Specialist Time Calculation (min) (ACUTE ONLY) 11 min   Pt received in bed, requesting to sit up in the chair for lunch. Pt needed ModA w/ bed mobility and MinA +2 for STS. Pt was able to take a couple of steps to the chair, feeling much stronger today. Pt situated in chair w/ call bell ad personal belongings in reach. Chair alarm on.  [name] Mobility Specialist  Please contact vis Secure Chat or  Rehab Office [phone]

## 2022-09-09 NOTE — Progress Notes (Signed)
PROGRESS NOTE  [name] LKG:[id number] DOB: 1932-09-04 DOA: 09/04/2022 PCP: Frederica Kuster, MD  Brief History   The patient is a 89 yr old woman with a past medical history significant for cardiomyopathy status post BiV/ICD, hypertension, atrial fibrillation, hypothyroidism, melanoma, anxiety and depression. Patient was admitted with frequent falls and altered mentation. She presented from home with lethargy and was not able to communicate. CT head and MRI brain were negative for abnormality. She did appear to be volume depleted. She was also found to be in atrial fibrillation with RVR.   She was admitted to a telemetry bed. Cardiology was consulted. They are attempting to control her rate with metoprolol and low dose digoxin. Options for controlling her rate are limited by her low blood pressure and her COPD.   The patient is now awake and alert, but she is complaining of bilateral lower extremity weakness. When heart rate is controlled, PT/OT will be consulted to evaluate the patient's lower extremity weakness.  Echocardiogram was performed and demonstrated EF of 25-30%. The left ventricle demonstrated global hypokinesis. Diastolic function could not be evaluated. RV systolic function is mildly reduced. She has severely elevated pulmonary artery systolic pressure at 65.7 mmHg. There was a small pericardial effusion.   Interval History/Subjective  Patient continues to report bilateral lower extremity weakness.  Heart rate is controlled.  Likely discharge to skilled nursing facility tomorrow.  Patient is not keen on being discharged today.   Objective   Vitals:  Vitals:   09/09/22 0826 09/09/22 1248  BP:  112/82  Pulse: 99 83  Resp:  17  Temp:  98.2 F (36.8 C)  SpO2:  94%    Exam:  Constitutional:  The patient is awake, alert, and oriented x 3.  Respiratory:  Decreased air entry.   Cardiovascular:  S1-S2, irregular.   Abdomen:  Abdomen is soft,  non-tender Musculoskeletal:  No cyanosis, clubbing, or edema Neurologic:  Awake and alert. Patient moves all extremities.   I have personally reviewed the following:     Scheduled Meds:  feeding supplement  237 mL Oral BID BM   levothyroxine  100 mcg Oral QAC breakfast   megestrol  400 mg Oral Daily   metoprolol succinate  12.5 mg Oral BID   midodrine  10 mg Oral TID WC   pantoprazole  40 mg Oral Daily   Continuous Infusions:    Principal Problem:   Falls frequently Active Problems:   Fall at home, initial encounter   Chronic systolic CHF (congestive heart failure) (HCC)   UTI (urinary tract infection)   Anxiety and depression   Recurrent falls   Protein-calorie malnutrition, severe   A & P  Assessment & Plan:   Principal Problem:   Falls frequently Active Problems:   Fall at home, initial encounter   Chronic systolic CHF (congestive heart failure) (HCC)   UTI (urinary tract infection)   Anxiety and depression   Recurrent falls      Frequent falls -PT/OT consult. -Patient may be failing to thrive. -Patient was volume depleted on presentation. -UA revealed pyuria, but culture grew multiple organisms.  Repeat urine culture. -Patient has been volume replete.  DC IV fluid. -Encourage liberal oral fluid. -Cardiology input is appreciated.   -Based on frequent falls, cardiology is considering discontinuing DOAC. 09/09/2022: Pursue disposition tomorrow.  Likely discharge to skilled nursing facility.  Patient will need rehab.   Possible UTI -Urinalysis revealed pyuria. -However, urine culture grew multiple organisms (likely contaminants). -Repeat urinalysis  and culture. -Patient is on Rocephin 09/09/2022: Complete course of Rocephin today.  Urine culture grew mixed organisms.  No repeat urine culture visualized.   Arrhythmia/tachycardia/cardiomyopathy/ BiV/ICD/HTN  -Cardiology consulted.   -Will defer to the cardiology team.  09/09/2022: Heart rate is  controlled.   Subtle irregularity involving the subcapital right femoral neck. -CT right hip revealed: 1. No acute displaced right hip fracture. 2. Old healed fractures of the right superior and inferior pubic rami. 3. Small volume of ascites. 4. Diffuse soft tissue edema. 5. Colonic diverticulosis. 6. Aortic atherosclerosis.   History of atrial fibrillation -Cardiology team is directing care. -Xarelto on hold 09/09/2022: Heart rate is controlled.   Elevated LFTs -Mild, new.   -Unclear etiology  09/09/2022: Repeat CMP in the morning.     Volume depletion: -Patient has been adequately hydrated. -Hold IV fluids. 09/09/2022: Continue to hold IV fluids.   Chronic systolic CHF (congestive heart failure) (HCC): -Seems compensated.   Hypothyroidism -Synthroid resumed   GERD -Aciphex resumed       DVT prophylaxis: SCD. Code Status: DO NOT RESUSCITATE Family Communication:  Disposition Plan: This will depend on hospital course.     Consultants:  Cardiology.   Procedures:  None.   Antimicrobials:  IV Rocephin.   Berton Mount, MD [facility] Direct contact: see [email or web address]  7PM-7AM contact night coverage as above 09/08/2022, 6:14 PM  LOS: 2 days    LOS: 4 days

## 2022-09-09 NOTE — Progress Notes (Signed)
Physical Therapy Treatment Patient Details Name: [name] MRN: [id number] DOB: 09-Mar-1932 Today's Date: 09/09/2022   History of Present Illness 89 y.o. female presents to [facility] on 09/04/2022 after a fall. Pt reports progressive fatigue since February with frequent falls. PMH includes: AAA, anxiety, CHF, HTN, pacemaker, PNA.    PT Comments  Pt making steady progress with mobility. Able to amb short distances today with assistance. Patient will benefit from continued inpatient follow up therapy, <3 hours/day      Assistance Recommended at Discharge Frequent or constant Supervision/Assistance  If plan is discharge home, recommend the following:  Can travel by private vehicle    A lot of help with walking and/or transfers;A lot of help with bathing/dressing/bathroom;Assistance with cooking/housework;Direct supervision/assist for medications management;Assist for transportation;Direct supervision/assist for financial management;Help with stairs or ramp for entrance   No  Equipment Recommendations  Other (comment) (defer to post-acute)    Recommendations for Other Services       Precautions / Restrictions Precautions Precautions: Fall Restrictions Weight Bearing Restrictions: No     Mobility  Bed Mobility               General bed mobility comments: Pt up in chair    Transfers Overall transfer level: Needs assistance Equipment used: Rolling walker (2 wheels), 2 person hand held assist Transfers: Sit to/from Stand Sit to Stand: Mod assist           General transfer comment: Assist to power up and to control descent to sitting. Pt with posterior bias making the rise difficult.    Ambulation/Gait Ambulation/Gait assistance: Min assist Gait Distance (Feet): 15 Feet (x 2) Assistive device: Rolling walker (2 wheels) Gait Pattern/deviations: Step-to pattern, Shuffle, Decreased step length - right, Decreased step length - left, Trunk flexed, Narrow base of  support Gait velocity: reduced Gait velocity interpretation: <1.31 ft/sec, indicative of household ambulator   General Gait Details: Assist for balance and support. Verbal cues to stand more erect   Stairs             Wheelchair Mobility     Tilt Bed    Modified Rankin (Stroke Patients Only)       Balance Overall balance assessment: Needs assistance Sitting-balance support: No upper extremity supported, Feet supported Sitting balance-Leahy Scale: Fair     Standing balance support: Bilateral upper extremity supported, Reliant on assistive device for balance Standing balance-Leahy Scale: Poor Standing balance comment: Walker and min assist for static standing. Verbal cues to bring feet back underneath her center of gravity to correct posterior bias.                            Cognition Arousal/Alertness: Awake/alert Behavior During Therapy: WFL for tasks assessed/performed Overall Cognitive Status: Impaired/Different from baseline Area of Impairment: Safety/judgement, Following commands, Memory, Attention, Problem solving                   Current Attention Level: Selective Memory: Decreased short-term memory Following Commands: Follows one step commands with increased time Safety/Judgement: Decreased awareness of deficits   Problem Solving: Slow processing, Requires verbal cues, Difficulty sequencing          Exercises      General Comments General comments (skin integrity, edema, etc.): VSS on RA. SpO2 92-94% throughout      Pertinent Vitals/Pain Pain Assessment Pain Assessment: No/denies pain    Home Living  Prior Function            PT Goals (current goals can now be found in the care plan section) Acute Rehab PT Goals Patient Stated Goal: to regain strength, return to independence Progress towards PT goals: Progressing toward goals    Frequency    Min 1X/week      PT Plan  Current plan remains appropriate;Frequency needs to be updated    Co-evaluation              AM-PAC PT "6 Clicks" Mobility   Outcome Measure  Help needed turning from your back to your side while in a flat bed without using bedrails?: A Lot Help needed moving from lying on your back to sitting on the side of a flat bed without using bedrails?: A Lot Help needed moving to and from a bed to a chair (including a wheelchair)?: A Lot Help needed standing up from a chair using your arms (e.g., wheelchair or bedside chair)?: A Lot Help needed to walk in hospital room?: Total Help needed climbing 3-5 steps with a railing? : Total 6 Click Score: 10    End of Session Equipment Utilized During Treatment: Gait belt Activity Tolerance: Patient tolerated treatment well Patient left: in chair;with call bell/phone within reach;with chair alarm set Nurse Communication: Mobility status PT Visit Diagnosis: Other abnormalities of gait and mobility (R26.89);Muscle weakness (generalized) (M62.81)     Time: 1610-9604 PT Time Calculation (min) (ACUTE ONLY): 29 min  Charges:    $Gait Training: 23-37 mins PT General Charges $$ ACUTE PT VISIT: 1 Visit                     [facility] PT Acute Rehabilitation Services Office [phone]    Angelina Ok [facility] 09/09/2022, 4:20 PM

## 2022-09-09 NOTE — Progress Notes (Signed)
Patient Name: [name] Date of Encounter: 09/09/2022 [facility] Cardiologist: Peter Swazil[place], MD   Interval Summary  .    [name] is concerned about her leg weakness. Otherwise, no issues.  Vital Signs .    Vitals:   09/08/22 2346 09/09/22 0436 09/09/22 0747 09/09/22 0826  BP: 115/83 117/64 118/77   Pulse: 86 76 87 99  Resp: 18 16 18    Temp: 97.7 F (36.5 C) 97.6 F (36.4 C) 98.1 F (36.7 C)   TempSrc: Oral Oral Oral   SpO2: 95% 90% 92%   Weight:      Height:        Intake/Output Summary (Last 24 hours) at 09/09/2022 0909 Last data filed at 09/09/2022 0436 Gross per 24 hour  Intake 419.11 ml  Output 1150 ml  Net -730.89 ml      09/05/2022    5:16 AM 09/04/2022   11:36 PM 02/03/2022    3:51 PM  Last 3 Weights  Weight (lbs) 127 lb 10.3 oz 125 lb 118 lb 3.2 oz  Weight (kg) 57.9 kg 56.7 kg 53.615 kg      Telemetry/ECG    Afib, rates controlled- Personally Reviewed  Physical Exam .   GEN: No acute distress.  frail Neck: JVP visible to mandible Cardiac: Irregularly irregular, no murmurs, rubs, or gallops.  Respiratory: Clear to auscultation bilaterally. GI: Soft, nontender, non-distended  MS: No edema  Assessment & Plan .     Recurrent atrial fibrillation with RVR S/P BiV-PPM   Patient admitted with frequent falls, found with likely near persistent atrial fibrillation/flutter over the last 2-3 days per device interrogation in the setting of UTI. Previous device check in February with less than 1% afib burden. Reportedly this is when fall frequency increased.    Patient remains in afib this morning, ventricular rates into the 130s. TTE this admission with LVEF down to 25-30%. Patient previously on Amiodarone, stopped in August of 2023 due to concern of side effects. Not on [facility] 2/2 frequent falls   Today, July 4 will continue rate control strategy s/p digoxin at 7/3; Continue Toprol XL 12.5mg  BID. May need to engage EP team this  admission if rates are not controlled to consider AVN ablation. She is quite frail and has FTT, can consider palliative for her as well     Chronic HFrEF Hx NICM   Patient with hx EF 20% in 2006 with no CAD. Received BiV-ICD at that time. Subsequent recovery of LVEF until March 2022 when she had decrease back to 25-30%, felt to be afib driven. Last echo later in August 2022 with LVEF 45-50%.    Initially, BNP 881.5. TTE shows LVEF has decreased, now 25-30% with global hypokinesis. 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 This echo also shows severely elevated pulmonary artery pressure, estimated 65.44mmHg with severe TR and moderate MR. IVC dilated with less than 50% respiratory variability.  Continue to hold home Losartan 25mg  with prior low BP, was started on midodrine No SGLT2i with UTI  s/p  IV lasix, net negative -730 today. Renal function is normal. She's frail and euvolemic. No plans for further diuresis today   Recurrent falls Acute encephalopathy with UTI   Patient with increase in fall frequency since February. When fall frequency initially increased, patient had low afib burden per device. Interrogation this admission does show an increase in frequency starting about 4 days ago. Falls would not seem to be specifically caused  by arrhythmia.    Management per primary team Would avoid anticoagulation at current time given anemia and falls.   Iron deficiency anemia   Patient with HBG down to 8.7 this admission with low iron, TIBC, and iron sat. Need to consider IV iron vs PO replacement. Management per primary team.    For questions or updates, please contact [facility] Please consult [email or web address] for contact info under    Signed, Maisie Fus, MD '

## 2022-09-09 NOTE — TOC Progression Note (Signed)
Transition of Care [facility]) - Progression Note    Patient Details  Name: [name] MRN: [id number] Date of Birth: [birth year]  [facility] CM/SW Contact  Delilah Shan, LCSWA Phone Number: 09/09/2022, 12:33 PM  Clinical Narrative:     Patient has SNF bed at [facility] when medically ready. CSW will continue to follow and assist with patients dc planning needs.  Expected Discharge Plan: Skilled Nursing Facility Barriers to Discharge: Continued Medical Work up  Expected Discharge Plan and Services In-house Referral: Clinical Social Work     Living arrangements for the past 2 months: Apartment                                       Social Determinants of Health (SDOH) Interventions SDOH Screenings   Food Insecurity: No Food Insecurity (09/05/2022)  Housing: Low Risk  (09/05/2022)  Transportation Needs: No Transportation Needs (09/05/2022)  Utilities: At Risk (09/05/2022)  Alcohol Screen: Low Risk  (04/20/2018)  Depression (PHQ2-9): Low Risk  (12/09/2021)  Tobacco Use: Medium Risk (09/05/2022)    Readmission Risk Interventions     No data to display

## 2022-09-09 NOTE — Progress Notes (Signed)
Pt in recliner with purewick dislodged.  Pad under patient is saturated with urine & there is urine puddled on the floor beside and under her chair.  While cleaning the pt, she voided again in the chair.  Pt was given a full bath, clean gown, clean linens, and a new purewick.  She was assisted back to bed from  the chair with a walker & 2 assists.  Alonza Bogus

## 2022-09-10 DIAGNOSIS — I4891 Unspecified atrial fibrillation: Secondary | ICD-10-CM | POA: Diagnosis not present

## 2022-09-10 DIAGNOSIS — Z79890 Hormone replacement therapy (postmenopausal): Secondary | ICD-10-CM | POA: Diagnosis not present

## 2022-09-10 DIAGNOSIS — I9589 Other hypotension: Secondary | ICD-10-CM | POA: Diagnosis not present

## 2022-09-10 DIAGNOSIS — I10 Essential (primary) hypertension: Secondary | ICD-10-CM | POA: Diagnosis not present

## 2022-09-10 DIAGNOSIS — M25442 Effusion, left hand: Secondary | ICD-10-CM | POA: Diagnosis not present

## 2022-09-10 DIAGNOSIS — I5022 Chronic systolic (congestive) heart failure: Secondary | ICD-10-CM | POA: Diagnosis not present

## 2022-09-10 DIAGNOSIS — M25559 Pain in unspecified hip: Secondary | ICD-10-CM | POA: Diagnosis not present

## 2022-09-10 DIAGNOSIS — R531 Weakness: Secondary | ICD-10-CM | POA: Diagnosis not present

## 2022-09-10 DIAGNOSIS — Z66 Do not resuscitate: Secondary | ICD-10-CM | POA: Diagnosis not present

## 2022-09-10 DIAGNOSIS — I480 Paroxysmal atrial fibrillation: Secondary | ICD-10-CM | POA: Diagnosis not present

## 2022-09-10 DIAGNOSIS — M1711 Unilateral primary osteoarthritis, right knee: Secondary | ICD-10-CM | POA: Diagnosis not present

## 2022-09-10 DIAGNOSIS — R278 Other lack of coordination: Secondary | ICD-10-CM | POA: Diagnosis not present

## 2022-09-10 DIAGNOSIS — Z515 Encounter for palliative care: Secondary | ICD-10-CM | POA: Diagnosis not present

## 2022-09-10 DIAGNOSIS — Z7689 Persons encountering health services in other specified circumstances: Secondary | ICD-10-CM | POA: Diagnosis not present

## 2022-09-10 DIAGNOSIS — Z7901 Long term (current) use of anticoagulants: Secondary | ICD-10-CM | POA: Diagnosis not present

## 2022-09-10 DIAGNOSIS — R4182 Altered mental status, unspecified: Secondary | ICD-10-CM | POA: Diagnosis not present

## 2022-09-10 DIAGNOSIS — S72001A Fracture of unspecified part of neck of right femur, initial encounter for closed fracture: Secondary | ICD-10-CM | POA: Diagnosis not present

## 2022-09-10 DIAGNOSIS — E785 Hyperlipidemia, unspecified: Secondary | ICD-10-CM | POA: Diagnosis not present

## 2022-09-10 DIAGNOSIS — M6281 Muscle weakness (generalized): Secondary | ICD-10-CM | POA: Diagnosis not present

## 2022-09-10 DIAGNOSIS — I4819 Other persistent atrial fibrillation: Secondary | ICD-10-CM | POA: Diagnosis not present

## 2022-09-10 DIAGNOSIS — F331 Major depressive disorder, recurrent, moderate: Secondary | ICD-10-CM | POA: Diagnosis not present

## 2022-09-10 DIAGNOSIS — E039 Hypothyroidism, unspecified: Secondary | ICD-10-CM | POA: Diagnosis not present

## 2022-09-10 DIAGNOSIS — F05 Delirium due to known physiological condition: Secondary | ICD-10-CM | POA: Diagnosis not present

## 2022-09-10 DIAGNOSIS — I6782 Cerebral ischemia: Secondary | ICD-10-CM | POA: Diagnosis not present

## 2022-09-10 DIAGNOSIS — R41841 Cognitive communication deficit: Secondary | ICD-10-CM | POA: Diagnosis not present

## 2022-09-10 DIAGNOSIS — Z7189 Other specified counseling: Secondary | ICD-10-CM | POA: Diagnosis not present

## 2022-09-10 DIAGNOSIS — I081 Rheumatic disorders of both mitral and tricuspid valves: Secondary | ICD-10-CM | POA: Diagnosis not present

## 2022-09-10 DIAGNOSIS — W19XXXA Unspecified fall, initial encounter: Secondary | ICD-10-CM | POA: Diagnosis not present

## 2022-09-10 DIAGNOSIS — I420 Dilated cardiomyopathy: Secondary | ICD-10-CM | POA: Diagnosis not present

## 2022-09-10 DIAGNOSIS — J4489 Other specified chronic obstructive pulmonary disease: Secondary | ICD-10-CM | POA: Diagnosis not present

## 2022-09-10 DIAGNOSIS — I272 Pulmonary hypertension, unspecified: Secondary | ICD-10-CM | POA: Diagnosis not present

## 2022-09-10 DIAGNOSIS — M160 Bilateral primary osteoarthritis of hip: Secondary | ICD-10-CM | POA: Diagnosis not present

## 2022-09-10 DIAGNOSIS — Z79899 Other long term (current) drug therapy: Secondary | ICD-10-CM | POA: Diagnosis not present

## 2022-09-10 DIAGNOSIS — R2689 Other abnormalities of gait and mobility: Secondary | ICD-10-CM | POA: Diagnosis not present

## 2022-09-10 DIAGNOSIS — I714 Abdominal aortic aneurysm, without rupture, unspecified: Secondary | ICD-10-CM | POA: Diagnosis not present

## 2022-09-10 DIAGNOSIS — M47816 Spondylosis without myelopathy or radiculopathy, lumbar region: Secondary | ICD-10-CM | POA: Diagnosis not present

## 2022-09-10 DIAGNOSIS — K219 Gastro-esophageal reflux disease without esophagitis: Secondary | ICD-10-CM | POA: Diagnosis not present

## 2022-09-10 DIAGNOSIS — S72144A Nondisplaced intertrochanteric fracture of right femur, initial encounter for closed fracture: Secondary | ICD-10-CM | POA: Diagnosis not present

## 2022-09-10 DIAGNOSIS — Y92129 Unspecified place in nursing home as the place of occurrence of the external cause: Secondary | ICD-10-CM | POA: Diagnosis not present

## 2022-09-10 DIAGNOSIS — N390 Urinary tract infection, site not specified: Secondary | ICD-10-CM | POA: Diagnosis not present

## 2022-09-10 DIAGNOSIS — R2681 Unsteadiness on feet: Secondary | ICD-10-CM | POA: Diagnosis not present

## 2022-09-10 DIAGNOSIS — I130 Hypertensive heart and chronic kidney disease with heart failure and stage 1 through stage 4 chronic kidney disease, or unspecified chronic kidney disease: Secondary | ICD-10-CM | POA: Diagnosis not present

## 2022-09-10 DIAGNOSIS — M19042 Primary osteoarthritis, left hand: Secondary | ICD-10-CM | POA: Diagnosis not present

## 2022-09-10 DIAGNOSIS — R930 Abnormal findings on diagnostic imaging of skull and head, not elsewhere classified: Secondary | ICD-10-CM | POA: Diagnosis not present

## 2022-09-10 DIAGNOSIS — G2581 Restless legs syndrome: Secondary | ICD-10-CM | POA: Diagnosis not present

## 2022-09-10 DIAGNOSIS — R296 Repeated falls: Secondary | ICD-10-CM | POA: Diagnosis not present

## 2022-09-10 DIAGNOSIS — R82998 Other abnormal findings in urine: Secondary | ICD-10-CM | POA: Diagnosis not present

## 2022-09-10 DIAGNOSIS — S32511A Fracture of superior rim of right pubis, initial encounter for closed fracture: Secondary | ICD-10-CM | POA: Diagnosis not present

## 2022-09-10 DIAGNOSIS — Y92009 Unspecified place in unspecified non-institutional (private) residence as the place of occurrence of the external cause: Secondary | ICD-10-CM | POA: Diagnosis not present

## 2022-09-10 DIAGNOSIS — I771 Stricture of artery: Secondary | ICD-10-CM | POA: Diagnosis not present

## 2022-09-10 DIAGNOSIS — S72024A Nondisplaced fracture of epiphysis (separation) (upper) of right femur, initial encounter for closed fracture: Secondary | ICD-10-CM | POA: Diagnosis not present

## 2022-09-10 DIAGNOSIS — S72141A Displaced intertrochanteric fracture of right femur, initial encounter for closed fracture: Secondary | ICD-10-CM | POA: Diagnosis not present

## 2022-09-10 DIAGNOSIS — N1831 Chronic kidney disease, stage 3a: Secondary | ICD-10-CM | POA: Diagnosis not present

## 2022-09-10 DIAGNOSIS — Z7401 Bed confinement status: Secondary | ICD-10-CM | POA: Diagnosis not present

## 2022-09-10 DIAGNOSIS — M1990 Unspecified osteoarthritis, unspecified site: Secondary | ICD-10-CM | POA: Diagnosis not present

## 2022-09-10 DIAGNOSIS — Z681 Body mass index (BMI) 19 or less, adult: Secondary | ICD-10-CM | POA: Diagnosis not present

## 2022-09-10 DIAGNOSIS — E46 Unspecified protein-calorie malnutrition: Secondary | ICD-10-CM | POA: Diagnosis not present

## 2022-09-10 DIAGNOSIS — F32A Depression, unspecified: Secondary | ICD-10-CM | POA: Diagnosis not present

## 2022-09-10 DIAGNOSIS — E43 Unspecified severe protein-calorie malnutrition: Secondary | ICD-10-CM | POA: Diagnosis not present

## 2022-09-10 DIAGNOSIS — I7300 Raynaud's syndrome without gangrene: Secondary | ICD-10-CM | POA: Diagnosis not present

## 2022-09-10 DIAGNOSIS — G4700 Insomnia, unspecified: Secondary | ICD-10-CM | POA: Diagnosis not present

## 2022-09-10 DIAGNOSIS — F419 Anxiety disorder, unspecified: Secondary | ICD-10-CM | POA: Diagnosis not present

## 2022-09-10 LAB — GLUCOSE, CAPILLARY
Glucose-Capillary: 84 mg/dL (ref 70–99)
Glucose-Capillary: 90 mg/dL (ref 70–99)
Glucose-Capillary: 86 mg/dL (ref 70–99)

## 2022-09-10 LAB — CULTURE, BLOOD (ROUTINE X 2)

## 2022-09-10 MED ORDER — DIGOXIN 125 MCG PO TABS
0.1250 mg | ORAL_TABLET | Freq: Every day | ORAL | Status: DC
  Administered 2022-09-10: 0.125 mg via ORAL
  Filled 2022-09-10: qty 1

## 2022-09-10 MED ORDER — MEGESTROL ACETATE 400 MG/10ML PO SUSP: 400.0000 mg | Freq: Every day | ORAL | 0 refills | Status: DC

## 2022-09-10 MED ORDER — ENSURE ENLIVE PO LIQD: 237.0000 mL | Freq: Two times a day (BID) | ORAL | 12 refills | Status: DC

## 2022-09-10 MED ORDER — DIGOXIN 62.5 MCG PO TABS: 0.0625 mg | ORAL_TABLET | Freq: Every day | ORAL | 1 refills | Status: DC

## 2022-09-10 MED ORDER — MIDODRINE HCL 10 MG PO TABS: 10.0000 mg | ORAL_TABLET | Freq: Three times a day (TID) | ORAL | 0 refills | Status: AC

## 2022-09-10 MED ORDER — ORAL CARE MOUTH RINSE: 15.0000 mL | OROMUCOSAL | 0 refills | Status: DC | PRN

## 2022-09-10 MED ORDER — DIGOXIN 62.5 MCG PO TABS: 0.1250 mg | ORAL_TABLET | Freq: Every day | ORAL | 1 refills | Status: DC

## 2022-09-10 MED ORDER — OXYCODONE HCL 5 MG PO TABS: 5.0000 mg | ORAL_TABLET | ORAL | 0 refills | Status: DC | PRN

## 2022-09-10 MED ORDER — METOPROLOL SUCCINATE ER 25 MG PO TB24: 12.5000 mg | ORAL_TABLET | Freq: Two times a day (BID) | ORAL | 1 refills | Status: DC

## 2022-09-10 MED ORDER — SENNOSIDES-DOCUSATE SODIUM 8.6-50 MG PO TABS: 1.0000 | ORAL_TABLET | Freq: Every evening | ORAL | 0 refills | Status: AC | PRN

## 2022-09-10 MED ORDER — DIGOXIN 125 MCG PO TABS: 0.1250 mg | ORAL_TABLET | Freq: Every day | ORAL | 1 refills | Status: DC

## 2022-09-10 NOTE — Plan of Care (Signed)

## 2022-09-10 NOTE — Progress Notes (Signed)
Report given to [place] at [facility]

## 2022-09-10 NOTE — Progress Notes (Signed)
Occupational Therapy Treatment Patient Details Name: [name] MRN: [id number] DOB: 05-17-1932 Today's Date: 09/10/2022   History of present illness 89 y.o. female presents to [facility] hospital on 09/04/2022 after a fall. Pt reports progressive fatigue since February with frequent falls. PMH includes: AAA, anxiety, CHF, HTN, pacemaker, PNA.   OT comments  Patient yelling for assist.  OT stopped in and patient requesting BSC use.  Patient needing up to Mod A for bed mobility, Mod A for sit to stand, and increased time and Min A for step pivot to the 3n1.  Patient able to perform hygiene seated, but needing Mod A for pant management.  Setup for seated grooming.  Patient stating she is waiting for transport to d/c to a SNF for post acute rehab.  OT will continue efforts if the remains in the acute setting.     Recommendations for follow up therapy are one component of a multi-disciplinary discharge planning process, led by the attending physician.  Recommendations may be updated based on patient status, additional functional criteria and insurance authorization.    Assistance Recommended at Discharge Frequent or constant Supervision/Assistance  Patient can return home with the following  A lot of help with bathing/dressing/bathroom;Assistance with cooking/housework;Direct supervision/assist for medications management;Direct supervision/assist for financial management;Assist for transportation;Help with stairs or ramp for entrance;A lot of help with walking and/or transfers   Equipment Recommendations  None recommended by OT    Recommendations for Other Services      Precautions / Restrictions Precautions Precautions: Fall Restrictions Weight Bearing Restrictions: No       Mobility Bed Mobility Overal bed mobility: Needs Assistance Bed Mobility: Supine to Sit, Sit to Supine     Supine to sit: Min assist Sit to supine: Min assist     Patient Response: Cooperative  Transfers Overall  transfer level: Needs assistance Equipment used: Rolling walker (2 wheels) Transfers: Sit to/from Stand, Bed to chair/wheelchair/BSC Sit to Stand: Min assist, Mod assist     Step pivot transfers: Min assist, Mod assist           Balance Overall balance assessment: Needs assistance Sitting-balance support: No upper extremity supported, Feet supported Sitting balance-Leahy Scale: Fair     Standing balance support: Reliant on assistive device for balance Standing balance-Leahy Scale: Poor                             ADL either performed or assessed with clinical judgement   ADL       Grooming: Set up;Sitting                       Toileting- Clothing Manipulation and Hygiene: Sit to/from stand;Moderate assistance              Extremity/Trunk Assessment Upper Extremity Assessment Upper Extremity Assessment: Generalized weakness   Lower Extremity Assessment Lower Extremity Assessment: Defer to PT evaluation   Cervical / Trunk Assessment Cervical / Trunk Assessment: Kyphotic    Vision Patient Visual Report: No change from baseline     Perception Perception Perception: Not tested   Praxis Praxis Praxis: Not tested    Cognition Arousal/Alertness: Awake/alert Behavior During Therapy: WFL for tasks assessed/performed Overall Cognitive Status: Impaired/Different from baseline                       Memory: Decreased short-term memory Following Commands: Follows one step commands with increased time  Problem Solving: Requires verbal cues, Requires tactile cues                             Pertinent Vitals/ Pain       Pain Assessment Pain Assessment: No/denies pain                                                          Frequency  Min 1X/week        Progress Toward Goals  OT Goals(current goals can now be found in the care plan section)  Progress towards OT goals: Progressing  toward goals  Acute Rehab OT Goals OT Goal Formulation: With patient Time For Goal Achievement: 09/19/22 Potential to Achieve Goals: Good  Plan Discharge plan remains appropriate;Frequency remains appropriate    Co-evaluation                 AM-PAC OT "6 Clicks" Daily Activity     Outcome Measure   Help from another person eating meals?: None Help from another person taking care of personal grooming?: None Help from another person toileting, which includes using toliet, bedpan, or urinal?: A Lot Help from another person bathing (including washing, rinsing, drying)?: A Lot Help from another person to put on and taking off regular upper body clothing?: A Little Help from another person to put on and taking off regular lower body clothing?: A Lot 6 Click Score: 17    End of Session Equipment Utilized During Treatment: Rolling walker (2 wheels);Oxygen  OT Visit Diagnosis: Unsteadiness on feet (R26.81);Other abnormalities of gait and mobility (R26.89);Muscle weakness (generalized) (M62.81);History of falling (Z91.81);Repeated falls (R29.6)   Activity Tolerance Patient tolerated treatment well   Patient Left in bed;with call bell/phone within reach;with bed alarm set   Nurse Communication Mobility status        Time: 1610-9604 OT Time Calculation (min): 19 min  Charges: OT General Charges $OT Visit: 1 Visit OT Treatments $Self Care/Home Management : 8-22 mins  09/10/2022  RP, OTR/L  Acute Rehabilitation Services  Office:  [phone]   Suzanna Obey 09/10/2022, 4:12 PM

## 2022-09-10 NOTE — Discharge Summary (Addendum)
Physician Discharge Summary  Patient ID: [name] MRN: [id number] DOB/AGE: 1932/07/04 89 y.o.  Admit date: 09/04/2022 Discharge date: 09/10/2022  Admission Diagnoses:  Discharge Diagnoses:  Principal Problem:   Falls frequently Active Problems:   Fall at home, initial encounter   Chronic systolic CHF (congestive heart failure) (HCC)   UTI (urinary tract infection)   Anxiety and depression   Recurrent falls   Protein-calorie malnutrition, severe   Discharged Condition: stable  Hospital Course:  The patient is an 89 year old female with past medical history significant for cardiomyopathy status post BiV/ICD, hypertension, atrial fibrillation, hypothyroidism, melanoma, anxiety and depression. Patient was admitted with frequent falls and altered mentation. She presented from home with lethargy and was not able to communicate. CT head was negative for abnormality. She did appear to be volume depleted.  Urine culture grew multiple species.  Patient was also found to be in atrial fibrillation with RVR.    Patient was admitted to a telemetry bed. Cardiology was consulted assist with patient's management.  Heart rate is currently controlled.  Cardiology team had an digitoxin overload.  Anticoagulation was discontinued due to recurrent falls.  Patient has been adequately hydrated.  Encephalopathy has resolved.  Patient was treated with IV Rocephin for presumed UTI.  Patient continues to report bilateral lower extremity weakness.  PT OT has recommended skilled nursing facility with rehab.  Patient has been optimized.  Patient will be discharged today to skilled nursing facility.     Echocardiogram was performed and demonstrated EF of 25-30%. The left ventricle demonstrated global hypokinesis. Diastolic function could not be evaluated. RV systolic function is mildly reduced. She has severely elevated pulmonary artery systolic pressure at 65.7 mmHg. There was a small pericardial effusion.     Frequent falls: -PT/OT consult. -Patient may be failing to thrive. -Patient was volume depleted on presentation. -UA revealed pyuria, but culture grew multiple organisms.  Repeat urine culture has not been visualized. -Patient has completed course of IV Rocephin. -Patient has been volume replete.   -Encourage liberal oral fluid. -Cardiology input is appreciated.   -Based on frequent falls, cardiology has advised discontinuation of DOAC (anticoagulation)  Possible UTI: -Urinalysis revealed pyuria. -However, urine culture grew multiple organisms (likely contaminants). -Repeat urinalysis and culture ordered. -Patient was treated with Rocephin.     Arrhythmia/tachycardia/cardiomyopathy/ BiV/ICD/HTN: -Cardiology directed care. -Heart rate is controlled.   Subtle irregularity involving the subcapital right femoral neck. -CT right hip revealed: 1. No acute displaced right hip fracture. 2. Old healed fractures of the right superior and inferior pubic rami. 3. Small volume of ascites. 4. Diffuse soft tissue edema. 5. Colonic diverticulosis. 6. Aortic atherosclerosis.   History of atrial fibrillation -Cardiology team will take care. -Anticoagulation has been discontinued. -Heart rate is currently controlled.    Elevated LFTs -Mild, new.   -Unclear etiology    Volume depletion: -Patient has been adequately hydrated.   Chronic systolic CHF (congestive heart failure) (HCC): -Seems compensated.   Hypothyroidism -Synthroid resumed   GERD -Aciphex resumed   Consults: cardiology  Significant Diagnostic Studies:  Echo revealed: 1. Left ventricular ejection fraction, by estimation, is 25 to 30%. The  left ventricle has severely decreased function. The left ventricle  demonstrates global hypokinesis. Left ventricular diastolic function could  not be evaluated.   2. Right ventricular systolic function is mildly reduced. The right  ventricular size is normal. There is severely  elevated pulmonary artery  systolic pressure. The estimated right ventricular systolic pressure is  65.7 mmHg.  3. Left atrial size was moderately dilated.   4. Right atrial size was mildly dilated.   5. A small pericardial effusion is present. The pericardial effusion is  circumferential.   6. The mitral valve is abnormal. Moderate mitral valve regurgitation.   7. The tricuspid valve is abnormal. Tricuspid valve regurgitation is  severe.   8. The aortic valve is tricuspid. Aortic valve regurgitation is not  visualized. Aortic valve sclerosis is present, with no evidence of aortic  valve stenosis.   9. The pulmonic valve was abnormal.  10. The inferior vena cava is dilated in size with <50% respiratory  variability, suggesting right atrial pressure of 15 mmHg.   Comparison(s): Changes from prior study are noted. 10/09/2020: LVEF 45-50%.   CT head and CT cervical spine without contrast revealed: 1. No acute intracranial abnormality. No calvarial fracture. Small occipital scalp hematoma at the vertex. 2. No acute fracture or listhesis of the cervical spine. 3. Right pleural effusion.   Discharge Exam: Blood pressure 110/83, pulse 93, temperature 98.6 F (37 C), temperature source Oral, resp. rate 18, height 5\' 8"  (1.727 m), weight 57.9 kg, SpO2 95 %.   Disposition: Discharge disposition: 03-Skilled Nursing Facility       Discharge Instructions     Diet - low sodium heart healthy   Complete by: As directed    Increase activity slowly   Complete by: As directed       Allergies as of 09/10/2022       Reactions   Hydrocodone Itching   Cephalexin Itching, Swelling   Other reaction(s): edema   Ciprofloxacin Other (See Comments)   Not indicated due to aneurysm in aorta     Doxycycline Other (See Comments)   Unknown reaction   Escitalopram Other (See Comments)   Dry mouth   Levofloxacin Other (See Comments)   Not indicated due to aneurysm in aorta    Moxifloxacin Other  (See Comments)   Not indicated due to aneurysm in aorta    Ofloxacin Other (See Comments)   Unknown reaction   Sertraline Other (See Comments)   insomnia Other reaction(s): insomnia   Sulfamethoxazole Hives   Any sulfa meds.   Tape Other (See Comments)   Burns skin.    Latex Rash   "If pt. Makes contact with or wearing"   Neomycin Rash        Medication List     STOP taking these medications    Biotin 5000 MCG Tabs   estradiol 0.1 MG/GM vaginal cream Commonly known as: ESTRACE   fexofenadine 180 MG tablet Commonly known as: ALLEGRA   losartan 25 MG tablet Commonly known as: COZAAR   Rivaroxaban 15 MG Tabs tablet Commonly known as: Xarelto       TAKE these medications    acetaminophen 500 MG tablet Commonly known as: TYLENOL Take 1,000 mg by mouth every 8 (eight) hours as needed for moderate pain or mild pain.   CALCIUM + D PO Take 2 tablets by mouth daily. 1000 units Vitamin D 1200 mg calcium   Digoxin 62.5 MCG Tabs Take 0.0625 mg by mouth daily. Start taking on: September 11, 2022   feeding supplement Liqd Take 237 mLs by mouth 2 (two) times daily between meals. Start taking on: September 11, 2022   levothyroxine 100 MCG tablet Commonly known as: Synthroid Take 1 tablet (100 mcg total) by mouth daily before breakfast. E03.9   megestrol 400 MG/10ML suspension Commonly known as: MEGACE Take 10 mLs (400  mg total) by mouth daily. Start taking on: September 11, 2022   metoprolol succinate 25 MG 24 hr tablet Commonly known as: TOPROL-XL Take 0.5 tablets (12.5 mg total) by mouth 2 (two) times daily. What changed: when to take this   midodrine 10 MG tablet Commonly known as: PROAMATINE Take 1 tablet (10 mg total) by mouth 3 (three) times daily with meals.   mouth rinse Liqd solution 15 mLs by Mouth Rinse route as needed (for oral care).   oxyCODONE 5 MG immediate release tablet Commonly known as: Oxy IR/ROXICODONE Take 1 tablet (5 mg total) by mouth every 4  (four) hours as needed for moderate pain or severe pain.   RABEprazole 20 MG tablet Commonly known as: ACIPHEX Take one tablet by mouth once daily for stomach   senna-docusate 8.6-50 MG tablet Commonly known as: Senokot-S Take 1 tablet by mouth at bedtime as needed for up to 14 days for mild constipation.   Systane 0.4-0.3 % Soln Generic drug: Polyethyl Glycol-Propyl Glycol Place 1 drop into both eyes daily as needed (dry eyes).   Vitamin B 12 500 MCG Tabs Take 1,000 mcg by mouth daily.   vitamin C 1000 MG tablet Take 1,000 mg by mouth at bedtime. Ester C        Contact information for after-discharge care     Destination     [facility]erred [facility] .   Service: Skilled Nursing Contact information: [address] Test Update Address [place] [address] [phone]                    Time spent: 36 minutes.  SignedBarnetta Chapel 09/10/2022, 2:58 PM

## 2022-09-10 NOTE — TOC Transition Note (Signed)
Transition of Care [facility]) - CM/SW Discharge Note   Patient Details  Name: [name] MRN: [id number] Date of Birth: 02-Jul-1932  [facility] CM/SW Contact:  Delilah Shan, LCSWA Phone Number: 09/10/2022, 2:15 PM   Clinical Narrative:     Patient will DC to: [facility]  Anticipated DC date: 09/10/2022  Family notified: Patient declined. Patient informed CSW she let her friends know she was discharging.  Transport by: Sharin Mons  ?  Per MD patient ready for DC to [facility] . RN, patient, patient's family, and facility notified of DC. Discharge Summary sent to facility. RN given number for report tele# [phone] RM# [id number]. DNR signed by MD attached to patients DC packet.DC packet on chart. Ambulance transport requested for patient.  CSW signing off.   Final next level of care: Skilled Nursing Facility Barriers to Discharge: No Barriers Identified   Patient Goals and CMS Choice CMS Medicare.gov Compare Post Acute Care list provided to:: Patient Choice offered to / list presented to : Patient  Discharge Placement                Patient chooses bed at: [facility] Patient to be transferred to facility by: [facility] Name of family member notified: Patient Declined Patient and family notified of of transfer: 09/10/22  Discharge Plan and Services Additional resources added to the After Visit Summary for   In-house Referral: Clinical Social Work                                   Social Determinants of Health (SDOH) Interventions SDOH Screenings   Food Insecurity: No Food Insecurity (09/05/2022)  Housing: Low Risk  (09/05/2022)  Transportation Needs: No Transportation Needs (09/05/2022)  Utilities: At Risk (09/05/2022)  Alcohol Screen: Low Risk  (04/20/2018)  Depression (PHQ2-9): Low Risk  (12/09/2021)  Tobacco Use: Medium Risk (09/05/2022)     Readmission Risk Interventions     No data to display

## 2022-09-10 NOTE — Plan of Care (Signed)
  Problem: Education: Goal: Knowledge of General Education information will improve Description Including pain rating scale, medication(s)/side effects and non-pharmacologic comfort measures Outcome: Progressing   Problem: Clinical Measurements: Goal: Ability to maintain clinical measurements within normal limits will improve Outcome: Progressing Goal: Will remain free from infection Outcome: Progressing Goal: Respiratory complications will improve Outcome: Progressing Goal: Cardiovascular complication will be avoided Outcome: Progressing   Problem: Nutrition: Goal: Adequate nutrition will be maintained Outcome: Progressing   Problem: Coping: Goal: Level of anxiety will decrease Outcome: Progressing   

## 2022-09-10 NOTE — Progress Notes (Addendum)
Rounding Note    Patient Name: [name] Date of Encounter: 09/10/2022  [facility] Cardiologist: Peter Swaziland, MD   Subjective   Denies any CP or SOB. Still concerned about leg weakness which has been going on since Jan 2023 and stroke risk.  Inpatient Medications    Scheduled Meds:  feeding supplement  237 mL Oral BID BM   levothyroxine  100 mcg Oral QAC breakfast   megestrol  400 mg Oral Daily   metoprolol succinate  12.5 mg Oral BID   midodrine  10 mg Oral TID WC   pantoprazole  40 mg Oral Daily   Continuous Infusions:  PRN Meds: acetaminophen, lip balm, mouth rinse, oxyCODONE, senna-docusate   Vital Signs    Vitals:   09/09/22 1658 09/09/22 2041 09/09/22 2334 09/10/22 0439  BP: (!) 118/56 125/68 111/72 108/70  Pulse: 93 99 92 99  Resp: 18 (!) 21 16 14   Temp: 98.2 F (36.8 C) 98.1 F (36.7 C) 97.8 F (36.6 C) 97.6 F (36.4 C)  TempSrc: Oral Oral Oral Oral  SpO2: 93% 97% 98% 93%  Weight:      Height:        Intake/Output Summary (Last 24 hours) at 09/10/2022 0720 Last data filed at 09/09/2022 1659 Gross per 24 hour  Intake --  Output 450 ml  Net -450 ml      09/05/2022    5:16 AM 09/04/2022   11:36 PM 02/03/2022    3:51 PM  Last 3 Weights  Weight (lbs) 127 lb 10.3 oz 125 lb 118 lb 3.2 oz  Weight (kg) 57.9 kg 56.7 kg 53.615 kg      Telemetry    Atrial fibrillation with HR 80-100s - Personally Reviewed  ECG    Atrial fibrillation with LBBB - Personally Reviewed  Physical Exam   GEN: No acute distress.   Neck: No JVD Cardiac: irregularly irregular, no murmurs, rubs, or gallops.  Respiratory: Clear to auscultation bilaterally. GI: Soft, nontender, non-distended  MS: No edema; No deformity. Neuro:  Nonfocal  Psych: Normal affect   Labs    High Sensitivity Troponin:  No results for input(s): "TROPONINIHS" in the last 720 hours.   Chemistry Recent Labs  Lab 09/04/22 2345 09/05/22 0335 09/06/22 0820 09/06/22 1523  09/07/22 0218 09/08/22 0935  NA 134* 135   < > 139 140 139  K 4.0 3.8   < > 3.5 3.5 4.0  CL 107 108   < > 114* 116* 112*  CO2 16* 17*   < > 18* 16* 20*  GLUCOSE 130* 119*   < > 107* 108* 144*  BUN 34* 33*   < > 31* 29* 27*  CREATININE 1.13* 1.10*   < > 1.19* 0.93 0.98  CALCIUM 8.9 8.7*   < > 7.8* 8.0* 8.4*  MG 2.1 2.1  --   --  2.0  --   PROT 5.9*  --   --   --  5.0*  --   ALBUMIN 3.6  --   --   --  2.6*  --   AST 58*  --   --   --  34  --   ALT 61*  --   --   --  51*  --   ALKPHOS 110  --   --   --  80  --   BILITOT 1.4*  --   --   --  0.7  --   GFRNONAA 47* 49*   < >  44* 59* 56*  ANIONGAP 11 10   < > 7 8 7    < > = values in this interval not displayed.    Lipids No results for input(s): "CHOL", "TRIG", "HDL", "LABVLDL", "LDLCALC", "CHOLHDL" in the last 168 hours.  Hematology Recent Labs  Lab 09/06/22 0327 09/07/22 0218 09/08/22 0935  WBC 6.7 7.8 9.1  RBC 2.90*  2.73* 3.43* 3.62*  HGB 8.7* 9.8* 10.6*  HCT 27.5* 31.2* 34.1*  MCV 94.8 91.0 94.2  MCH 30.0 28.6 29.3  MCHC 31.6 31.4 31.1  RDW 15.5 15.3 15.4  PLT 207 224 251   Thyroid  Recent Labs  Lab 09/05/22 0333  TSH 2.716    BNP Recent Labs  Lab 09/06/22 0327  BNP 881.5*    DDimer No results for input(s): "DDIMER" in the last 168 hours.   Radiology    No results found.  Cardiac Studies   Echo 09/06/2022     ECHOCARDIOGRAM REPORT       Patient Name:   [name] Date of Exam: 09/06/2022 Medical Rec #:  [id number]       Height:       68.0 in Accession #:    [id number]      Weight:       127.6 lb Date of Birth:  [birth year]        BSA:          1.689 m Patient Age:    89 years        BP:           104/66 mmHg Patient Gender: F               HR:           109 bpm. Exam Location:  Inpatient  Procedure: 2D Echo  Indications:    NICM   History:        Patient has prior history of Echocardiogram examinations, most                 recent 10/09/2020. CHF; Arrythmias:Atrial Fibrillation.    Sonographer:    Darlys Gales Referring Phys: [id number] Perlie Gold  IMPRESSIONS    1. Left ventricular ejection fraction, by estimation, is 25 to 30%. The left ventricle has severely decreased function. The left ventricle demonstrates global hypokinesis. Left ventricular diastolic function could not be evaluated.  2. Right ventricular systolic function is mildly reduced. The right ventricular size is normal. There is severely elevated pulmonary artery systolic pressure. The estimated right ventricular systolic pressure is 65.7 mmHg.  3. Left atrial size was moderately dilated.  4. Right atrial size was mildly dilated.  5. A small pericardial effusion is present. The pericardial effusion is circumferential.  6. The mitral valve is abnormal. Moderate mitral valve regurgitation.  7. The tricuspid valve is abnormal. Tricuspid valve regurgitation is severe.  8. The aortic valve is tricuspid. Aortic valve regurgitation is not visualized. Aortic valve sclerosis is present, with no evidence of aortic valve stenosis.  9. The pulmonic valve was abnormal. 10. The inferior vena cava is dilated in size with <50% respiratory variability, suggesting right atrial pressure of 15 mmHg.  Comparison(s): Changes from prior study are noted. 10/09/2020: LVEF 45-50%.    Patient Profile     89 y.o. female with PMH of NICM, chronic HFrEF s/p prior BiV-ICD downgraded to BiV PPM last gen change 2020 Bos Sci), LBBB, NSVT, PAF, moderate MR/TR, small AAA and L iliac aneurysm followed by VVS, anxiety/depression,  HTN, HLD, RLS and CKD stage III who presented on   Assessment & Plan    Recurrent falls  - admitted with falls but found likely has been near persistent afib /flutter for 2-3 days per device interrogation in the setting of UTI, previous device check in Feb showed less 1% afib burden.   - anticoagulation held due to frequent falls, however patient is quite concerned about stroke risk.    NICM: not on  SGLT2i due to frequent UTIs  - Echo 10/09/2020 showed EF 45-50% - EF down to 25-30% during this admission   Chronic systolic CHF s/p BiV PPM  PAF: Off of amiodarone in Aug 2023 due to concern for side effects. Increase afib burden in the few days prior to arrival based on device interrogation. Digoxin started during this admission to help with rate control, off yesterday, consider resume digoxin at 0.125mg  daily.  HTN: miodrine initiated to help support diuresis.   HLD  CKD stage III  For questions or updates, please contact [facility] Please consult [email or web address] for contact info under      Signed, Azalee Course, PA  09/10/2022, 7:20 AM    Patient seen and examined, note reviewed with the signed Advanced Practice Provider. I personally reviewed laboratory data, imaging studies and relevant notes. I independently examined the patient and formulated the important aspects of the plan. I have personally discussed the plan with the patient and/or family. Comments or changes to the note/plan are indicated below.  She clinically appears to be doing better than she has over the last several days.  Hold off to see the patient walk more. Heart rate has improved.  She is on low-dose beta-blocker along with digoxin.  Has been off anticoagulation due to multiple falls.  This can be discussed again in outpatient setting with her primary cardiologist if need to resume this.  She will benefit from palliative care consultation as well. Despite 10 mg 3 times daily of midodrine she is still with low normal blood pressure makes it difficult to optimize her guideline directed medical therapy.  She may benefit from SNF at the time of discharge.  Thomasene Ripple DO, MS [facility] Attending Cardiologist [facility]  [address] [place], [place] [address] [phone] Website: [email or web address]

## 2022-09-13 ENCOUNTER — Other Ambulatory Visit: Payer: Self-pay

## 2022-09-13 DIAGNOSIS — E039 Hypothyroidism, unspecified: Secondary | ICD-10-CM

## 2022-09-13 DIAGNOSIS — L659 Nonscarring hair loss, unspecified: Secondary | ICD-10-CM

## 2022-09-13 MED ORDER — LEVOTHYROXINE SODIUM 100 MCG PO TABS
100.0000 ug | ORAL_TABLET | Freq: Every day | ORAL | 0 refills | Status: DC
Start: 2022-09-13 — End: 2023-11-21

## 2022-09-14 DIAGNOSIS — N390 Urinary tract infection, site not specified: Secondary | ICD-10-CM | POA: Diagnosis not present

## 2022-09-14 DIAGNOSIS — F419 Anxiety disorder, unspecified: Secondary | ICD-10-CM | POA: Diagnosis not present

## 2022-09-14 DIAGNOSIS — K219 Gastro-esophageal reflux disease without esophagitis: Secondary | ICD-10-CM | POA: Diagnosis not present

## 2022-09-14 DIAGNOSIS — E46 Unspecified protein-calorie malnutrition: Secondary | ICD-10-CM | POA: Diagnosis not present

## 2022-09-14 DIAGNOSIS — I5022 Chronic systolic (congestive) heart failure: Secondary | ICD-10-CM | POA: Diagnosis not present

## 2022-09-14 DIAGNOSIS — E785 Hyperlipidemia, unspecified: Secondary | ICD-10-CM | POA: Diagnosis not present

## 2022-09-14 DIAGNOSIS — E039 Hypothyroidism, unspecified: Secondary | ICD-10-CM | POA: Diagnosis not present

## 2022-09-14 DIAGNOSIS — M1990 Unspecified osteoarthritis, unspecified site: Secondary | ICD-10-CM

## 2022-09-14 DIAGNOSIS — G2581 Restless legs syndrome: Secondary | ICD-10-CM | POA: Diagnosis not present

## 2022-09-14 DIAGNOSIS — I10 Essential (primary) hypertension: Secondary | ICD-10-CM | POA: Diagnosis not present

## 2022-09-14 DIAGNOSIS — G4700 Insomnia, unspecified: Secondary | ICD-10-CM | POA: Diagnosis not present

## 2022-09-14 DIAGNOSIS — R296 Repeated falls: Secondary | ICD-10-CM | POA: Diagnosis not present

## 2022-09-14 DIAGNOSIS — F331 Major depressive disorder, recurrent, moderate: Secondary | ICD-10-CM | POA: Diagnosis not present

## 2022-09-15 ENCOUNTER — Ambulatory Visit: Payer: Medicare Other | Admitting: Family Medicine

## 2022-09-15 DIAGNOSIS — E785 Hyperlipidemia, unspecified: Secondary | ICD-10-CM | POA: Diagnosis not present

## 2022-09-15 DIAGNOSIS — I10 Essential (primary) hypertension: Secondary | ICD-10-CM | POA: Diagnosis not present

## 2022-09-15 DIAGNOSIS — F331 Major depressive disorder, recurrent, moderate: Secondary | ICD-10-CM | POA: Diagnosis not present

## 2022-09-15 DIAGNOSIS — E039 Hypothyroidism, unspecified: Secondary | ICD-10-CM | POA: Diagnosis not present

## 2022-09-16 ENCOUNTER — Other Ambulatory Visit: Payer: Self-pay | Admitting: *Deleted

## 2022-09-16 NOTE — Patient Outreach (Addendum)
[name] resides in [facility] skilled nursing facility. Screening for potential [facility] care coordination services as benefit of health plan and Primary Care Provider.   Met with [name] at beside at [facility]. [name] reports she plans to return home post SNF. However, she did not offer much information beyond that. Writer explained care coordination services and Social research officer, government will continue to follow for potential needs. Left [facility] Care Management brochure and contact information at bedside.   Secure message sent to Desseree, [employer] social worker to make aware writer is following for potential care coordination services and transition plans.   Addendum for 09/16/22: Met with Deseree, [employer] Child psychotherapist. Deseree reports [name] is from home alone. Has some confusion. UA collected. New to oxygen. No local family. Unsure of transition plans.   Will continue to follow.   Raiford Noble, MSN, RN,BSN [facility] Post Acute Care Coordinator [phone] (Direct dial)

## 2022-09-20 DIAGNOSIS — Z7689 Persons encountering health services in other specified circumstances: Secondary | ICD-10-CM

## 2022-09-20 DIAGNOSIS — M25442 Effusion, left hand: Secondary | ICD-10-CM

## 2022-09-23 DIAGNOSIS — M25442 Effusion, left hand: Secondary | ICD-10-CM

## 2022-09-29 NOTE — Progress Notes (Deleted)
[name] Date of Birth: 09-17-1932 Medical Record #[id number]  History of Present Illness: [name] is seen today for follow up. She has multiple medical issues which include a cardiomyopathy, underlying BiV/ICD which resulted in improvement of her EF, LBBB, HTN, thyroid disease, melanoma, anxiety/depression and chronic systolic HF.   In September 2018 she was noted in device clinic to have paroxysmal Afib with controlled rate. She was started on Eliquis. [place] Vasc score of at least 4. She developed itching on Eliquis and switched to Xarelto. Itching did not go away so she was switched to Coumadin. After about a month the itching resolved.  She was later switched back to Xarelto without rash. She had change out of her ICD generator in November due to ERI. Her device was downgraded to Biventricular pacing only. Follow up with Dr Ladona Ridgel  In February showed normal device function.  She was seen in the office on 04/28/20 with complaints of increased SOB, edema and fatigue. Was found to be in Afib with rate 125. Patient admitted from the office with acute on chronic CHF felt to be secondary to atrial fibrillation with RVR. BNP elevated at 1,299. Chest x-ray consistent with CHF with mild pulmonary edema and small bilateral pleural effusions. Echo showed LVEF of 25-30% (down from 55-60% in 06/2019) with global hypokinesis and moderate to severe MR. RV mildly enlarged with mildly reduced systolic function, severe TR, and moderately elevated PASP. Also showed small pericardial effusion. Patient was diuresed with IV Lasix. Net negative 8.078 L during admission. Discharge weight 117lb but this may not be accurate (was 130.5lbs on 05/19/2020). Was discharged on Lasix 40mg  daily, Losartan 25mg  daily (rather than home Trandolapril), and Toprol-XL 25mg  daily (rather than home Lopressor). Did not add spironolactone  given soft BP at times. Patient also does not want to be on a lot of new medications. She was  considered for Tikosyn but did not want to start this. She was started on amiodarone.  Drop in EF felt to be secondary to atrial fibrillation with RVR. Given advance age, it was recommended to treat atrial fibrillation and heart failure medically and then reassess EF in 3 months. No ischemic evaluation felt to be necessary this admission.     On her last visit we added aldactone 25 mg daily. This resulted in  increase in creatinine and potassium aldactone was discontinued.   Repeat Echo in August showed improvement in EF to 45-50%. Last device check in August showed no recurrent Afib. She never did start Jardiance and in fact she does have a history of frequent UTIs.   She was hospitalized 04/07/21 - 04/10/21 after a mechanical fall suffering fractures of the right superior and left inferior pubic rami with moderate displacement, Orthopedics consulted: WBAT progress gradually with walker and 1-2 person assist.  No noted acute cardiac issues.  She was admitted 6/29-09/10/22 with acute encephalopathy, recurrent falls. Was in Afib with RVR. Treated for UTI. Anticoagulation discontinued due to recurrent falls. Echo done showing reduction in EF to 25-30%. Moderate MR. Pulmonary HTN. LAE. Medical therapy was limited by hypotension requiring midodrine. She was started on digoxin and low dose metoprolol for rate control. Losartan discontinued. DC to SNF. Not a candidate for other AAD therapy given problems taking amiodarone in past.   On follow up she is primarily concerned about continued pain in back and pelvis. She is weak. Concerned about risk of falls. No Afib. Notes she feels a lot better off amiodarone. No dyspnea or  edema.   Current Outpatient Medications  Medication Sig Dispense Refill   acetaminophen (TYLENOL) 500 MG tablet Take 1,000 mg by mouth every 8 (eight) hours as needed for moderate pain or mild pain.     Ascorbic Acid (VITAMIN C) 1000 MG tablet Take 1,000 mg by mouth at bedtime. Ester C      Calcium Carbonate-Vitamin D (CALCIUM + D PO) Take 2 tablets by mouth daily. 1000 units Vitamin D 1200 mg calcium     Cyanocobalamin (VITAMIN B 12) 500 MCG TABS Take 1,000 mcg by mouth daily.      Digoxin 62.5 MCG TABS Take 0.0625 mg by mouth daily. 30 tablet 1   feeding supplement (ENSURE ENLIVE / ENSURE PLUS) LIQD Take 237 mLs by mouth 2 (two) times daily between meals. 237 mL 12   levothyroxine (SYNTHROID) 100 MCG tablet Take 1 tablet (100 mcg total) by mouth daily before breakfast. E03.9 30 tablet 0   megestrol (MEGACE) 400 MG/10ML suspension Take 10 mLs (400 mg total) by mouth daily. 240 mL 0   metoprolol succinate (TOPROL-XL) 25 MG 24 hr tablet Take 0.5 tablets (12.5 mg total) by mouth 2 (two) times daily. 30 tablet 1   midodrine (PROAMATINE) 10 MG tablet Take 1 tablet (10 mg total) by mouth 3 (three) times daily with meals. 90 tablet 0   Mouthwashes (MOUTH RINSE) LIQD solution 15 mLs by Mouth Rinse route as needed (for oral care). 118 mL 0   oxyCODONE (OXY IR/ROXICODONE) 5 MG immediate release tablet Take 1 tablet (5 mg total) by mouth every 4 (four) hours as needed for moderate pain or severe pain. 30 tablet 0   Polyethyl Glycol-Propyl Glycol (SYSTANE) 0.4-0.3 % SOLN Place 1 drop into both eyes daily as needed (dry eyes).     RABEprazole (ACIPHEX) 20 MG tablet Take one tablet by mouth once daily for stomach 90 tablet 1   No current facility-administered medications for this visit.    Allergies  Allergen Reactions   Hydrocodone Itching   Cephalexin Itching and Swelling    Other reaction(s): edema   Ciprofloxacin Other (See Comments)    Not indicated due to aneurysm in aorta     Doxycycline Other (See Comments)    Unknown reaction   Escitalopram Other (See Comments)    Dry mouth   Levofloxacin Other (See Comments)    Not indicated due to aneurysm in aorta    Moxifloxacin Other (See Comments)    Not indicated due to aneurysm in aorta    Ofloxacin Other (See Comments)    Unknown  reaction   Sertraline Other (See Comments)    insomnia Other reaction(s): insomnia   Sulfamethoxazole Hives    Any sulfa meds.   Tape Other (See Comments)    Burns skin.    Latex Rash    "If pt. Makes contact with or wearing"   Neomycin Rash    Past Medical History:  Diagnosis Date   AAA (abdominal aortic aneurysm) (HCC)    Allergy    all year    Anxiety disorder    Arthritis    Barrett's esophagus 04/2008   Benign neoplasm of colon    Blood transfusion without reported diagnosis    long ago per pt   Cataract    removed both eyes   Chronic airway obstruction, not elsewhere classified    Chronic HFrEF (heart failure with reduced ejection fraction) (HCC)    Complication of anesthesia    slow to wake up  Depressive disorder, not elsewhere classified    Diverticulitis    Dysthymic disorder    Erosive gastritis 08/2004   Frequent urinary tract infections    GERD (gastroesophageal reflux disease)    Hair thinning    HTN (hypertension)    pt denies    Hyperplastic polyps of stomach 06/2002   Hypothyroidism    Iliac aneurysm (HCC)    Incisional hernia    Insomnia 08/20/2015   Kyphosis 08/20/2015   LBBB (left bundle branch block)    LBBB (left bundle branch block)    Melanoma (HCC)    right arm   Mitral regurgitation    Nonischemic cardiomyopathy (HCC)    Osteopenia    Other and unspecified hyperlipidemia    Pacemaker 04/26/2012   PAF (paroxysmal atrial fibrillation) (HCC)    11/2016   Pain in joint, lower leg    Palpitations    Pneumonia    Restless legs syndrome (RLS)    Synovial cyst of popliteal space    Unspecified chronic bronchitis (HCC)    Unspecified nasal polyp    Unspecified sinusitis (chronic)     Past Surgical History:  Procedure Laterality Date   ABDOMINAL HYSTERECTOMY  1076   endometriosis   APPENDECTOMY     BI-VENTRICULAR PACEMAKER INSERTION N/A 04/26/2012   Procedure: BI-VENTRICULAR PACEMAKER INSERTION (CRT-P);  Surgeon: Marinus Maw,  MD;  Location: [facility] CATH LAB;  Service: Cardiovascular;  Laterality: N/A;   BIV PACEMAKER GENERATOR CHANGEOUT N/A 01/26/2019   Procedure: BIV PACEMAKER GENERATOR CHANGEOUT;  Surgeon: Marinus Maw, MD;  Location: [facility] INVASIVE CV LAB;  Service: Cardiovascular;  Laterality: N/A;   CARDIOVERSION N/A 05/06/2020   Procedure: CARDIOVERSION;  Surgeon: Chrystie Nose, MD;  Location: [facility] ENDOSCOPY;  Service: Cardiovascular;  Laterality: N/A;   CARDIOVERSION N/A 05/19/2020   Procedure: CARDIOVERSION;  Surgeon: Jake Bathe, MD;  Location: [facility] ENDOSCOPY;  Service: Cardiovascular;  Laterality: N/A;   COLONOSCOPY     defibrillator insertion     s/p removal of previously implanted BiV ICD and insertion of a new BiV pacemaker on 04/26/12   excise mole of lip  2001   Dr. Everlene Farrier   excision of wen  1984   Dr. Valrie Hart   FEMORAL HERNIA REPAIR  12/25/2012   Dr Derrell Lolling   FEMORAL HERNIA REPAIR  01/04/2013   Recurrent - Dr. Darin Engels HERNIA REPAIR N/A 10/01/2013   Procedure: LAPAROSCOPIC INCISIONAL HERNIA;  Surgeon: Atilano Ina, MD;  Location: [facility] ORS;  Service: General;  Laterality: N/A;   INCISIONAL HERNIA REPAIR N/A 09/07/2018   Procedure: LAPAROSCOPIC ASSISTED REPAIR OF INCISIONAL HERNIA;  Surgeon: Gaynelle Adu, MD;  Location: [facility] OR;  Service: General;  Laterality: N/A;   INGUINAL HERNIA REPAIR Right 09/26/2012   Procedure: RIGHT INGUINAL HERNIA REPAIR WITH MESH;  Surgeon: Adolph Pollack, MD;  Location: [facility] OR;  Service: General;  Laterality: Right;   INGUINAL HERNIA REPAIR Right 12/25/2012   Procedure: explor right groin, small bowel rescection, tissue repair right femoral hernia;  Surgeon: Ernestene Mention, MD;  Location: [facility] ORS;  Service: General;  Laterality: Right;   INSERTION OF MESH Right 09/26/2012   Procedure: INSERTION OF MESH;  Surgeon: Adolph Pollack, MD;  Location: [facility] OR;  Service: General;  Laterality: Right;   INSERTION OF MESH N/A 10/01/2013   Procedure: INSERTION OF MESH;   Surgeon: Atilano Ina, MD;  Location: [facility] ORS;  Service: General;  Laterality: N/A;   LAPAROSCOPY N/A 01/04/2013  Procedure: Diagnostic Laparoscopy, exploratory laparotomy with small bowel resection, closure of right femoral hernia repair;  Surgeon: Lodema Pilot, DO;  Location: [facility] ORS;  Service: General;  Laterality: N/A;   Left arm surgery     POLYPECTOMY     RIGHT BREAST LUMPECTOMY  1988   Dr. Francina Ames   right knee surgery     arthroscopy: Dr. Simonne Come   TONSILLECTOMY     UPPER GASTROINTESTINAL ENDOSCOPY      Social History   Tobacco Use  Smoking Status Former   Current packs/day: 0.00   Average packs/day: 1 pack/day for 15.0 years (15.0 ttl pk-yrs)   Types: Cigarettes   Start date: 12/23/1978   Quit date: 12/22/1993   Years since quitting: 28.7  Smokeless Tobacco Never  Tobacco Comments   Does'nt reall year quit     Social History   Substance and Sexual Activity  Alcohol Use No   Alcohol/week: 0.0 standard drinks of alcohol    Family History  Problem Relation Age of Onset   Stroke Father    Heart disease Other        maternal side   Colon cancer Paternal Uncle    Breast cancer Maternal Aunt    Colon cancer Cousin    Diabetes Neg Hx    Colon polyps Neg Hx    Rectal cancer Neg Hx    Stomach cancer Neg Hx     Review of Systems: The review of systems is per the HPI.   All other systems were reviewed and are negative.  Physical Exam: There were no vitals taken for this visit. GENERAL:  thin, elderly thin WF in NAD HEENT:  PERRL, EOMI, sclera are clear. Oropharynx is clear. NECK:  No JVD, carotid upstroke brisk and symmetric, no bruits, no thyromegaly or adenopathy LUNGS:  Clear to auscultation bilaterally CHEST:  Unremarkable HEART:  RRR,  PMI not displaced or sustained,S1 and S2 within normal limits, no S3, no S4: no clicks, no rubs, no murmurs ABD:  Soft, nontender. BS +, no masses or bruits. No hepatomegaly, no splenomegaly EXT:  2 + pulses  throughout,  no edema, varicosities.  SKIN:  Warm and dry.  No rashes NEURO:  Alert and oriented x 3. Cranial nerves II through XII intact. PSYCH:  Cognitively intact   Wt Readings from Last 3 Encounters:  09/05/22 127 lb 10.3 oz (57.9 kg)  02/03/22 118 lb 3.2 oz (53.6 kg)  12/09/21 121 lb (54.9 kg)     LABORATORY DATA: Lab Results  Component Value Date   WBC 9.1 09/08/2022   HGB 10.6 (L) 09/08/2022   HCT 34.1 (L) 09/08/2022   PLT 251 09/08/2022   GLUCOSE 144 (H) 09/08/2022   CHOL 159 11/22/2019   TRIG 137 11/22/2019   HDL 53 11/22/2019   LDLCALC 82 11/22/2019   ALT 51 (H) 09/07/2022   AST 34 09/07/2022   NA 139 09/08/2022   K 4.0 09/08/2022   CL 112 (H) 09/08/2022   CREATININE 0.98 09/08/2022   BUN 27 (H) 09/08/2022   CO2 20 (L) 09/08/2022   TSH 2.716 09/05/2022   INR 1.7 (H) 05/18/2020     Echo: 07/22/16: Study Conclusions   - Left ventricle: The cavity size was mildly dilated. Wall   thickness was normal. Systolic function was normal. The estimated   ejection fraction was in the range of 50% to 55%. Wall motion was   normal; there were no regional wall motion abnormalities. - Aortic valve: There was  trivial regurgitation. - Mitral valve: There was mild regurgitation. - Atrial septum: No defect or patent foramen ovale was identified.   Echo 06/29/19: IMPRESSIONS     1. Left ventricular ejection fraction, by estimation, is 55 to 60%. The  left ventricle has normal function. The left ventricle has no regional  wall motion abnormalities. Left ventricular diastolic parameters are  consistent with Grade II diastolic  dysfunction (pseudonormalization). Elevated left atrial pressure.   2. Right ventricular systolic function is normal. The right ventricular  size is normal. There is moderately elevated pulmonary artery systolic  pressure.   3. Left atrial size was moderately dilated.   4. The mitral valve is normal in structure. Mild to moderate mitral valve   regurgitation.   5. Tricuspid valve regurgitation is mild to moderate.   6. The aortic valve is tricuspid. Aortic valve regurgitation is mild. No  aortic stenosis is present.   7. Aortic dilatation noted. There is borderline dilatation of the  ascending aorta measuring 37 mm.   8. The inferior vena cava is dilated in size with >50% respiratory  variability, suggesting right atrial pressure of 8 mmHg.    Echocardiogram 05/17/2020: Impressions:  1. Left ventricular ejection fraction, by estimation, is 25 to 30%. The  left ventricle has severely decreased function. The left ventricle  demonstrates global hypokinesis. Left ventricular diastolic function could  not be evaluated.   2. Right ventricular systolic function is mildly reduced. The right  ventricular size is mildly enlarged. There is moderately elevated  pulmonary artery systolic pressure. The estimated right ventricular  systolic pressure is 48.9 mmHg.   3. Left atrial size was severely dilated.   4. Right atrial size was mildly dilated.   5. A small pericardial effusion is present. The pericardial effusion is  circumferential.   6. The mitral valve is grossly normal. Moderate to severe mitral valve  regurgitation. No evidence of mitral stenosis.   7. Tricuspid valve regurgitation is severe.   8. The aortic valve is tricuspid. Aortic valve regurgitation is not  visualized. No aortic stenosis is present.   9. The inferior vena cava is dilated in size with <50% respiratory  variability, suggesting right atrial pressure of 15 mmHg.   Comparison(s): Changes from prior study are noted. EF now severely  reduced. Moderately dilated RV. Moderate to severe MR. Severe TR. RVSP ~49  mmHG.   Echo 10/09/20: IMPRESSIONS     1. Significant improvement in EF since echo done 05/17/20. Left  ventricular ejection fraction, by estimation, is 45 to 50%. The left  ventricle has mildly decreased function. The left ventricle demonstrates   global hypokinesis. The left ventricular  internal cavity size was mildly dilated. Left ventricular diastolic  parameters were normal.   2. ICD wires in RA/RV. Right ventricular systolic function is normal. The  right ventricular size is normal. There is mildly elevated pulmonary  artery systolic pressure.   3. Left atrial size was moderately dilated.   4. The pericardial effusion is posterior to the left ventricle.   5. ? MR from restricted posterior leaflet motion . The mitral valve is  abnormal. Moderate mitral valve regurgitation. No evidence of mitral  stenosis.   6. Tricuspid valve regurgitation is moderate.   7. The aortic valve is tricuspid. Aortic valve regurgitation is trivial.  No aortic stenosis is present.   8. The inferior vena cava is normal in size with greater than 50%  respiratory variability, suggesting right atrial pressure of 3 mmHg.  Assessment / Plan: 1.  History of dialted Cardiomyopathy with chronic systolic CHF.  EF  normal in April 2021. Has BiV pacemaker in place. Significant worsening of CHF in Feb. EF down to 25-30% in setting of Afib with RVR. Clinically improved.  Weight is stable  no edema. Maintaining NSR on exam and by pacer check.  Repeat Echo in August showed improved EF to 45-50%. Will continue losartan and Toprol. Not a candidate for SGLT 2 inhibitor due to history of UTIs. Not requiring diuretics.   2. NSVT. Follow up ICD/biV in device clinic. S/p generator change out in November 2020. Normal device function.   3. Paroxysmal Afib. persistent and ERAD post cardioversion on May 06, 2020. RVR. Repeat DCCV post amiodarone in March 2022. Now in NSR. No recurrent AFib since then.  [place] Vasc score of at least 4. On Xarelto. Off amiodarone due to intolerance.  Understands that if Afib recurs she will need to go back on it or consider Tikosyn.  4. Small AAA and left iliac aneurysm. Follow up with [facility]. Last doppler showed AAA was 2.9 cm.     5. Venous  varicosities. Encourage sodium restriction and support hose.      Follow up in 6 months

## 2022-09-30 ENCOUNTER — Ambulatory Visit: Payer: Medicare Other | Admitting: Cardiology

## 2022-10-01 ENCOUNTER — Telehealth: Payer: Self-pay | Admitting: Internal Medicine

## 2022-10-05 ENCOUNTER — Ambulatory Visit: Payer: Medicare Other | Admitting: Internal Medicine

## 2022-10-07 ENCOUNTER — Ambulatory Visit (HOSPITAL_COMMUNITY): Payer: Medicare Other

## 2022-10-07 ENCOUNTER — Ambulatory Visit: Payer: Medicare Other | Admitting: Vascular Surgery

## 2022-10-13 ENCOUNTER — Encounter: Payer: Medicare Other | Admitting: Family Medicine

## 2022-10-15 ENCOUNTER — Encounter: Payer: Self-pay | Admitting: Family Medicine

## 2022-10-18 ENCOUNTER — Encounter: Payer: Self-pay | Admitting: Family Medicine

## 2022-10-20 NOTE — Progress Notes (Signed)
error    This encounter was created in error - please disregard.

## 2022-10-27 DIAGNOSIS — F419 Anxiety disorder, unspecified: Secondary | ICD-10-CM | POA: Diagnosis not present

## 2022-10-27 DIAGNOSIS — N390 Urinary tract infection, site not specified: Secondary | ICD-10-CM | POA: Diagnosis not present

## 2022-10-27 DIAGNOSIS — F331 Major depressive disorder, recurrent, moderate: Secondary | ICD-10-CM | POA: Diagnosis not present

## 2022-10-27 DIAGNOSIS — I5022 Chronic systolic (congestive) heart failure: Secondary | ICD-10-CM | POA: Diagnosis not present

## 2022-10-27 DIAGNOSIS — G2581 Restless legs syndrome: Secondary | ICD-10-CM | POA: Diagnosis not present

## 2022-10-27 DIAGNOSIS — E039 Hypothyroidism, unspecified: Secondary | ICD-10-CM | POA: Diagnosis not present

## 2022-10-27 DIAGNOSIS — E46 Unspecified protein-calorie malnutrition: Secondary | ICD-10-CM | POA: Diagnosis not present

## 2022-10-27 DIAGNOSIS — G4700 Insomnia, unspecified: Secondary | ICD-10-CM | POA: Diagnosis not present

## 2022-10-27 DIAGNOSIS — M1990 Unspecified osteoarthritis, unspecified site: Secondary | ICD-10-CM | POA: Diagnosis not present

## 2022-10-27 DIAGNOSIS — Z7689 Persons encountering health services in other specified circumstances: Secondary | ICD-10-CM | POA: Diagnosis not present

## 2022-10-27 DIAGNOSIS — I10 Essential (primary) hypertension: Secondary | ICD-10-CM | POA: Diagnosis not present

## 2022-10-28 ENCOUNTER — Inpatient Hospital Stay (HOSPITAL_COMMUNITY): Payer: Medicare Other

## 2022-10-28 ENCOUNTER — Emergency Department (HOSPITAL_COMMUNITY): Payer: Medicare Other

## 2022-10-28 ENCOUNTER — Other Ambulatory Visit: Payer: Self-pay

## 2022-10-28 ENCOUNTER — Inpatient Hospital Stay (HOSPITAL_COMMUNITY)
Admission: EM | Admit: 2022-10-28 | Discharge: 2022-11-03 | DRG: 480 | Disposition: A | Discharge status: Skilled Nursing Facility | Payer: Medicare Other | Source: Skilled Nursing Facility | Attending: Student | Admitting: Student

## 2022-10-28 ENCOUNTER — Encounter (HOSPITAL_COMMUNITY): Payer: Self-pay

## 2022-10-28 DIAGNOSIS — I9589 Other hypotension: Secondary | ICD-10-CM | POA: Diagnosis present

## 2022-10-28 DIAGNOSIS — Z87440 Personal history of urinary (tract) infections: Secondary | ICD-10-CM

## 2022-10-28 DIAGNOSIS — Z7401 Bed confinement status: Secondary | ICD-10-CM | POA: Diagnosis not present

## 2022-10-28 DIAGNOSIS — Y92129 Unspecified place in nursing home as the place of occurrence of the external cause: Secondary | ICD-10-CM

## 2022-10-28 DIAGNOSIS — R531 Weakness: Secondary | ICD-10-CM | POA: Diagnosis not present

## 2022-10-28 DIAGNOSIS — Z9181 History of falling: Secondary | ICD-10-CM

## 2022-10-28 DIAGNOSIS — R636 Underweight: Secondary | ICD-10-CM

## 2022-10-28 DIAGNOSIS — Z823 Family history of stroke: Secondary | ICD-10-CM

## 2022-10-28 DIAGNOSIS — R2689 Other abnormalities of gait and mobility: Secondary | ICD-10-CM | POA: Diagnosis not present

## 2022-10-28 DIAGNOSIS — I130 Hypertensive heart and chronic kidney disease with heart failure and stage 1 through stage 4 chronic kidney disease, or unspecified chronic kidney disease: Secondary | ICD-10-CM | POA: Diagnosis not present

## 2022-10-28 DIAGNOSIS — J4489 Other specified chronic obstructive pulmonary disease: Secondary | ICD-10-CM | POA: Diagnosis not present

## 2022-10-28 DIAGNOSIS — K219 Gastro-esophageal reflux disease without esophagitis: Secondary | ICD-10-CM | POA: Diagnosis present

## 2022-10-28 DIAGNOSIS — I4891 Unspecified atrial fibrillation: Secondary | ICD-10-CM | POA: Diagnosis not present

## 2022-10-28 DIAGNOSIS — S32511A Fracture of superior rim of right pubis, initial encounter for closed fracture: Secondary | ICD-10-CM | POA: Diagnosis not present

## 2022-10-28 DIAGNOSIS — G2581 Restless legs syndrome: Secondary | ICD-10-CM | POA: Diagnosis present

## 2022-10-28 DIAGNOSIS — Z95810 Presence of automatic (implantable) cardiac defibrillator: Secondary | ICD-10-CM | POA: Diagnosis not present

## 2022-10-28 DIAGNOSIS — I517 Cardiomegaly: Secondary | ICD-10-CM | POA: Diagnosis not present

## 2022-10-28 DIAGNOSIS — M1990 Unspecified osteoarthritis, unspecified site: Secondary | ICD-10-CM | POA: Diagnosis not present

## 2022-10-28 DIAGNOSIS — Z7901 Long term (current) use of anticoagulants: Secondary | ICD-10-CM

## 2022-10-28 DIAGNOSIS — E039 Hypothyroidism, unspecified: Secondary | ICD-10-CM | POA: Diagnosis not present

## 2022-10-28 DIAGNOSIS — Z79899 Other long term (current) drug therapy: Secondary | ICD-10-CM

## 2022-10-28 DIAGNOSIS — R296 Repeated falls: Secondary | ICD-10-CM | POA: Diagnosis present

## 2022-10-28 DIAGNOSIS — I7300 Raynaud's syndrome without gangrene: Secondary | ICD-10-CM | POA: Diagnosis present

## 2022-10-28 DIAGNOSIS — I959 Hypotension, unspecified: Secondary | ICD-10-CM | POA: Diagnosis not present

## 2022-10-28 DIAGNOSIS — S72141A Displaced intertrochanteric fracture of right femur, initial encounter for closed fracture: Principal | ICD-10-CM | POA: Diagnosis present

## 2022-10-28 DIAGNOSIS — S72141D Displaced intertrochanteric fracture of right femur, subsequent encounter for closed fracture with routine healing: Secondary | ICD-10-CM | POA: Diagnosis not present

## 2022-10-28 DIAGNOSIS — M25559 Pain in unspecified hip: Secondary | ICD-10-CM | POA: Diagnosis not present

## 2022-10-28 DIAGNOSIS — R82998 Other abnormal findings in urine: Secondary | ICD-10-CM

## 2022-10-28 DIAGNOSIS — I714 Abdominal aortic aneurysm, without rupture, unspecified: Secondary | ICD-10-CM | POA: Diagnosis not present

## 2022-10-28 DIAGNOSIS — I081 Rheumatic disorders of both mitral and tricuspid valves: Secondary | ICD-10-CM | POA: Diagnosis present

## 2022-10-28 DIAGNOSIS — Z79890 Hormone replacement therapy (postmenopausal): Secondary | ICD-10-CM

## 2022-10-28 DIAGNOSIS — N1831 Chronic kidney disease, stage 3a: Secondary | ICD-10-CM | POA: Diagnosis present

## 2022-10-28 DIAGNOSIS — S7290XA Unspecified fracture of unspecified femur, initial encounter for closed fracture: Secondary | ICD-10-CM | POA: Diagnosis present

## 2022-10-28 DIAGNOSIS — I420 Dilated cardiomyopathy: Secondary | ICD-10-CM | POA: Diagnosis present

## 2022-10-28 DIAGNOSIS — G4700 Insomnia, unspecified: Secondary | ICD-10-CM | POA: Diagnosis not present

## 2022-10-28 DIAGNOSIS — Z91040 Latex allergy status: Secondary | ICD-10-CM

## 2022-10-28 DIAGNOSIS — F32A Depression, unspecified: Secondary | ICD-10-CM | POA: Diagnosis not present

## 2022-10-28 DIAGNOSIS — I110 Hypertensive heart disease with heart failure: Secondary | ICD-10-CM | POA: Diagnosis not present

## 2022-10-28 DIAGNOSIS — I10 Essential (primary) hypertension: Secondary | ICD-10-CM | POA: Diagnosis not present

## 2022-10-28 DIAGNOSIS — Z66 Do not resuscitate: Secondary | ICD-10-CM | POA: Diagnosis not present

## 2022-10-28 DIAGNOSIS — F05 Delirium due to known physiological condition: Secondary | ICD-10-CM | POA: Diagnosis not present

## 2022-10-28 DIAGNOSIS — Z881 Allergy status to other antibiotic agents status: Secondary | ICD-10-CM

## 2022-10-28 DIAGNOSIS — S7291XD Unspecified fracture of right femur, subsequent encounter for closed fracture with routine healing: Secondary | ICD-10-CM | POA: Diagnosis not present

## 2022-10-28 DIAGNOSIS — Z885 Allergy status to narcotic agent status: Secondary | ICD-10-CM

## 2022-10-28 DIAGNOSIS — N390 Urinary tract infection, site not specified: Secondary | ICD-10-CM | POA: Diagnosis not present

## 2022-10-28 DIAGNOSIS — F419 Anxiety disorder, unspecified: Secondary | ICD-10-CM | POA: Diagnosis not present

## 2022-10-28 DIAGNOSIS — W19XXXA Unspecified fall, initial encounter: Secondary | ICD-10-CM

## 2022-10-28 DIAGNOSIS — I6782 Cerebral ischemia: Secondary | ICD-10-CM | POA: Diagnosis not present

## 2022-10-28 DIAGNOSIS — I5022 Chronic systolic (congestive) heart failure: Secondary | ICD-10-CM | POA: Diagnosis not present

## 2022-10-28 DIAGNOSIS — S72024A Nondisplaced fracture of epiphysis (separation) (upper) of right femur, initial encounter for closed fracture: Secondary | ICD-10-CM | POA: Diagnosis not present

## 2022-10-28 DIAGNOSIS — E785 Hyperlipidemia, unspecified: Secondary | ICD-10-CM | POA: Diagnosis present

## 2022-10-28 DIAGNOSIS — S72001A Fracture of unspecified part of neck of right femur, initial encounter for closed fracture: Secondary | ICD-10-CM

## 2022-10-28 DIAGNOSIS — Z515 Encounter for palliative care: Secondary | ICD-10-CM | POA: Diagnosis not present

## 2022-10-28 DIAGNOSIS — T45515A Adverse effect of anticoagulants, initial encounter: Secondary | ICD-10-CM | POA: Diagnosis present

## 2022-10-28 DIAGNOSIS — G8929 Other chronic pain: Secondary | ICD-10-CM | POA: Diagnosis present

## 2022-10-28 DIAGNOSIS — I272 Pulmonary hypertension, unspecified: Secondary | ICD-10-CM | POA: Diagnosis not present

## 2022-10-28 DIAGNOSIS — M8580 Other specified disorders of bone density and structure, unspecified site: Secondary | ICD-10-CM | POA: Diagnosis present

## 2022-10-28 DIAGNOSIS — I771 Stricture of artery: Secondary | ICD-10-CM | POA: Diagnosis not present

## 2022-10-28 DIAGNOSIS — E43 Unspecified severe protein-calorie malnutrition: Secondary | ICD-10-CM | POA: Diagnosis present

## 2022-10-28 DIAGNOSIS — R54 Age-related physical debility: Secondary | ICD-10-CM | POA: Diagnosis present

## 2022-10-28 DIAGNOSIS — I5023 Acute on chronic systolic (congestive) heart failure: Secondary | ICD-10-CM | POA: Diagnosis not present

## 2022-10-28 DIAGNOSIS — Z882 Allergy status to sulfonamides status: Secondary | ICD-10-CM

## 2022-10-28 DIAGNOSIS — M47816 Spondylosis without myelopathy or radiculopathy, lumbar region: Secondary | ICD-10-CM | POA: Diagnosis not present

## 2022-10-28 DIAGNOSIS — M6281 Muscle weakness (generalized): Secondary | ICD-10-CM | POA: Diagnosis not present

## 2022-10-28 DIAGNOSIS — I4819 Other persistent atrial fibrillation: Secondary | ICD-10-CM | POA: Diagnosis present

## 2022-10-28 DIAGNOSIS — Z87891 Personal history of nicotine dependence: Secondary | ICD-10-CM | POA: Diagnosis not present

## 2022-10-28 DIAGNOSIS — M160 Bilateral primary osteoarthritis of hip: Secondary | ICD-10-CM | POA: Diagnosis not present

## 2022-10-28 DIAGNOSIS — S72143A Displaced intertrochanteric fracture of unspecified femur, initial encounter for closed fracture: Secondary | ICD-10-CM | POA: Diagnosis not present

## 2022-10-28 DIAGNOSIS — M545 Low back pain, unspecified: Secondary | ICD-10-CM | POA: Diagnosis present

## 2022-10-28 DIAGNOSIS — I447 Left bundle-branch block, unspecified: Secondary | ICD-10-CM | POA: Diagnosis present

## 2022-10-28 DIAGNOSIS — Y92009 Unspecified place in unspecified non-institutional (private) residence as the place of occurrence of the external cause: Principal | ICD-10-CM

## 2022-10-28 DIAGNOSIS — N189 Chronic kidney disease, unspecified: Secondary | ICD-10-CM | POA: Diagnosis not present

## 2022-10-28 DIAGNOSIS — M1711 Unilateral primary osteoarthritis, right knee: Secondary | ICD-10-CM | POA: Diagnosis not present

## 2022-10-28 DIAGNOSIS — Z681 Body mass index (BMI) 19 or less, adult: Secondary | ICD-10-CM

## 2022-10-28 DIAGNOSIS — R930 Abnormal findings on diagnostic imaging of skull and head, not elsewhere classified: Secondary | ICD-10-CM | POA: Diagnosis not present

## 2022-10-28 DIAGNOSIS — I480 Paroxysmal atrial fibrillation: Secondary | ICD-10-CM | POA: Diagnosis not present

## 2022-10-28 DIAGNOSIS — K2270 Barrett's esophagus without dysplasia: Secondary | ICD-10-CM | POA: Diagnosis present

## 2022-10-28 DIAGNOSIS — Z888 Allergy status to other drugs, medicaments and biological substances status: Secondary | ICD-10-CM

## 2022-10-28 DIAGNOSIS — S0990XA Unspecified injury of head, initial encounter: Secondary | ICD-10-CM | POA: Diagnosis present

## 2022-10-28 DIAGNOSIS — Z91048 Other nonmedicinal substance allergy status: Secondary | ICD-10-CM

## 2022-10-28 DIAGNOSIS — I2510 Atherosclerotic heart disease of native coronary artery without angina pectoris: Secondary | ICD-10-CM | POA: Diagnosis not present

## 2022-10-28 DIAGNOSIS — E46 Unspecified protein-calorie malnutrition: Secondary | ICD-10-CM | POA: Diagnosis not present

## 2022-10-28 DIAGNOSIS — S72144A Nondisplaced intertrochanteric fracture of right femur, initial encounter for closed fracture: Secondary | ICD-10-CM | POA: Diagnosis not present

## 2022-10-28 DIAGNOSIS — Z86010 Personal history of colonic polyps: Secondary | ICD-10-CM

## 2022-10-28 DIAGNOSIS — Z800 Family history of malignant neoplasm of digestive organs: Secondary | ICD-10-CM

## 2022-10-28 DIAGNOSIS — Z7189 Other specified counseling: Secondary | ICD-10-CM | POA: Diagnosis not present

## 2022-10-28 DIAGNOSIS — Z803 Family history of malignant neoplasm of breast: Secondary | ICD-10-CM

## 2022-10-28 DIAGNOSIS — M40209 Unspecified kyphosis, site unspecified: Secondary | ICD-10-CM | POA: Diagnosis present

## 2022-10-28 DIAGNOSIS — F331 Major depressive disorder, recurrent, moderate: Secondary | ICD-10-CM | POA: Diagnosis not present

## 2022-10-28 DIAGNOSIS — R002 Palpitations: Secondary | ICD-10-CM | POA: Diagnosis present

## 2022-10-28 DIAGNOSIS — Z85820 Personal history of malignant melanoma of skin: Secondary | ICD-10-CM

## 2022-10-28 LAB — URINALYSIS, ROUTINE W REFLEX MICROSCOPIC
Bilirubin Urine: NEGATIVE
Glucose, UA: NEGATIVE mg/dL
Hgb urine dipstick: NEGATIVE
Ketones, ur: NEGATIVE mg/dL
Nitrite: NEGATIVE
Protein, ur: 30 mg/dL — AB
Specific Gravity, Urine: 1.017 (ref 1.005–1.030)
WBC, UA: >50 WBC/hpf (ref 0–5)
pH: 5 (ref 5.0–8.0)

## 2022-10-28 LAB — CBC
HCT: 42 % (ref 36.0–46.0)
Hemoglobin: 13.5 g/dL (ref 12.0–15.0)
MCH: 28.5 pg (ref 26.0–34.0)
MCHC: 32.1 g/dL (ref 30.0–36.0)
MCV: 88.6 fL (ref 80.0–100.0)
Platelets: 228 10*3/uL (ref 150–400)
RBC: 4.74 MIL/uL (ref 3.87–5.11)
RDW: 16 % — ABNORMAL HIGH (ref 11.5–15.5)
WBC: 13.4 10*3/uL — ABNORMAL HIGH (ref 4.0–10.5)
nRBC: 0 % (ref 0.0–0.2)

## 2022-10-28 LAB — BASIC METABOLIC PANEL
Anion gap: 6 (ref 5–15)
BUN: 32 mg/dL — ABNORMAL HIGH (ref 8–23)
CO2: 21 mmol/L — ABNORMAL LOW (ref 22–32)
Calcium: 8.7 mg/dL — ABNORMAL LOW (ref 8.9–10.3)
Chloride: 110 mmol/L (ref 98–111)
Creatinine, Ser: 1.04 mg/dL — ABNORMAL HIGH (ref 0.44–1.00)
GFR, Estimated: 52 mL/min — ABNORMAL LOW (ref >60)
Glucose, Bld: 103 mg/dL — ABNORMAL HIGH (ref 70–99)
Potassium: 4.3 mmol/L (ref 3.5–5.1)
Sodium: 137 mmol/L (ref 135–145)

## 2022-10-28 LAB — CBC WITH DIFFERENTIAL/PLATELET
Abs Immature Granulocytes: 0.14 10*3/uL — ABNORMAL HIGH (ref 0.00–0.07)
Basophils Absolute: 0 10*3/uL (ref 0.0–0.1)
Basophils Relative: 0 %
Eosinophils Absolute: 0.3 10*3/uL (ref 0.0–0.5)
Eosinophils Relative: 3 %
HCT: 41.5 % (ref 36.0–46.0)
Hemoglobin: 13.1 g/dL (ref 12.0–15.0)
Immature Granulocytes: 1 %
Lymphocytes Relative: 10 %
Lymphs Abs: 1.1 10*3/uL (ref 0.7–4.0)
MCH: 28.1 pg (ref 26.0–34.0)
MCHC: 31.6 g/dL (ref 30.0–36.0)
MCV: 88.9 fL (ref 80.0–100.0)
Monocytes Absolute: 0.9 10*3/uL (ref 0.1–1.0)
Monocytes Relative: 9 %
Neutro Abs: 8.3 10*3/uL — ABNORMAL HIGH (ref 1.7–7.7)
Neutrophils Relative %: 77 %
Platelets: 243 10*3/uL (ref 150–400)
RBC: 4.67 MIL/uL (ref 3.87–5.11)
RDW: 16.3 % — ABNORMAL HIGH (ref 11.5–15.5)
WBC: 10.8 10*3/uL — ABNORMAL HIGH (ref 4.0–10.5)
nRBC: 0 % (ref 0.0–0.2)

## 2022-10-28 LAB — CREATININE, SERUM
Creatinine, Ser: 0.86 mg/dL (ref 0.44–1.00)
GFR, Estimated: >60 mL/min (ref >60)

## 2022-10-28 MED ORDER — VITAMIN D 25 MCG (1000 UNIT) PO TABS
1000.0000 [IU] | ORAL_TABLET | Freq: Every day | ORAL | Status: DC
  Administered 2022-10-28: 1000 [IU] via ORAL
  Filled 2022-10-28: qty 1

## 2022-10-28 MED ORDER — SENNOSIDES-DOCUSATE SODIUM 8.6-50 MG PO TABS
2.0000 | ORAL_TABLET | Freq: Two times a day (BID) | ORAL | Status: DC
  Administered 2022-10-28 – 2022-11-03 (×8): 2 via ORAL
  Filled 2022-10-28 (×10): qty 2

## 2022-10-28 MED ORDER — HYDROMORPHONE HCL 1 MG/ML IJ SOLN
0.2500 mg | Freq: Once | INTRAMUSCULAR | Status: DC
  Filled 2022-10-28: qty 1

## 2022-10-28 MED ORDER — METOPROLOL SUCCINATE ER 25 MG PO TB24
12.5000 mg | ORAL_TABLET | Freq: Every day | ORAL | Status: DC
  Administered 2022-10-28 – 2022-10-29 (×2): 12.5 mg via ORAL
  Filled 2022-10-28 (×3): qty 1

## 2022-10-28 MED ORDER — SODIUM CHLORIDE 0.9% FLUSH
3.0000 mL | Freq: Two times a day (BID) | INTRAVENOUS | Status: DC
  Administered 2022-10-28 – 2022-11-03 (×10): 3 mL via INTRAVENOUS

## 2022-10-28 MED ORDER — OXYCODONE HCL 5 MG PO TABS: 5.0000 mg | ORAL_TABLET | ORAL | Status: DC | PRN

## 2022-10-28 MED ORDER — ACETAMINOPHEN 650 MG RE SUPP: 650.0000 mg | Freq: Four times a day (QID) | RECTAL | Status: DC | PRN

## 2022-10-28 MED ORDER — ACETAMINOPHEN 325 MG PO TABS: 650.0000 mg | ORAL_TABLET | Freq: Four times a day (QID) | ORAL | Status: DC | PRN

## 2022-10-28 MED ORDER — DIGOXIN 125 MCG PO TABS
0.1250 mg | ORAL_TABLET | Freq: Every day | ORAL | Status: DC
  Administered 2022-10-29 – 2022-11-03 (×5): 0.125 mg via ORAL
  Filled 2022-10-28 (×7): qty 1

## 2022-10-28 MED ORDER — CALCIUM CARBONATE 1250 (500 CA) MG PO TABS
1.0000 | ORAL_TABLET | Freq: Two times a day (BID) | ORAL | Status: DC
  Administered 2022-10-30 – 2022-11-03 (×8): 1250 mg via ORAL
  Filled 2022-10-28 (×9): qty 1

## 2022-10-28 MED ORDER — VITAMIN B-12 1000 MCG PO TABS
1000.0000 ug | ORAL_TABLET | Freq: Every day | ORAL | Status: DC
  Administered 2022-10-31 – 2022-11-03 (×4): 1000 ug via ORAL
  Filled 2022-10-28 (×4): qty 1

## 2022-10-28 MED ORDER — LEVOTHYROXINE SODIUM 100 MCG PO TABS
100.0000 ug | ORAL_TABLET | Freq: Every day | ORAL | Status: DC
  Administered 2022-10-29 – 2022-11-03 (×5): 100 ug via ORAL
  Filled 2022-10-28 (×6): qty 1

## 2022-10-28 MED ORDER — MIDODRINE HCL 5 MG PO TABS
5.0000 mg | ORAL_TABLET | Freq: Three times a day (TID) | ORAL | Status: DC
  Administered 2022-10-29 – 2022-11-03 (×11): 5 mg via ORAL
  Filled 2022-10-28 (×15): qty 1

## 2022-10-28 MED ORDER — POLYETHYLENE GLYCOL 3350 17 G PO PACK: 17.0000 g | PACK | Freq: Every day | ORAL | Status: DC | PRN

## 2022-10-28 MED ORDER — PANTOPRAZOLE SODIUM 20 MG PO TBEC
20.0000 mg | DELAYED_RELEASE_TABLET | Freq: Every day | ORAL | Status: DC
  Administered 2022-10-28 – 2022-11-03 (×6): 20 mg via ORAL
  Filled 2022-10-28 (×6): qty 1

## 2022-10-28 MED ORDER — SODIUM CHLORIDE 0.9 % IV SOLN: INTRAVENOUS | Status: DC

## 2022-10-28 MED ORDER — NAPHAZOLINE-GLYCERIN 0.012-0.25 % OP SOLN: 1.0000 [drp] | Freq: Four times a day (QID) | OPHTHALMIC | Status: DC | PRN

## 2022-10-28 MED ORDER — ACETAMINOPHEN 500 MG PO TABS: 1000.0000 mg | ORAL_TABLET | Freq: Once | ORAL | Status: DC

## 2022-10-28 MED ORDER — ENOXAPARIN SODIUM 40 MG/0.4ML IJ SOSY: 40.0000 mg | PREFILLED_SYRINGE | INTRAMUSCULAR | Status: DC

## 2022-10-28 MED ORDER — HYDROMORPHONE HCL 1 MG/ML IJ SOLN: 0.2500 mg | INTRAMUSCULAR | Status: DC | PRN

## 2022-10-28 MED ORDER — OXYCODONE HCL ER 10 MG PO T12A
10.0000 mg | EXTENDED_RELEASE_TABLET | Freq: Two times a day (BID) | ORAL | Status: AC
  Administered 2022-10-29 (×3): 10 mg via ORAL
  Filled 2022-10-28 (×3): qty 1

## 2022-10-28 MED ORDER — ACETAMINOPHEN 325 MG PO TABS
650.0000 mg | ORAL_TABLET | ORAL | Status: DC
  Administered 2022-10-28 – 2022-10-29 (×3): 650 mg via ORAL
  Filled 2022-10-28 (×5): qty 2

## 2022-10-28 MED ORDER — ONDANSETRON HCL 4 MG/2ML IJ SOLN
4.0000 mg | Freq: Once | INTRAMUSCULAR | Status: DC
  Filled 2022-10-28: qty 2

## 2022-10-28 NOTE — ED Triage Notes (Signed)
Patient arrived via [facility], patient tripped trying to help someone. Patient does not remember if she hit her head, endorses taking Xarelto. Patient A&Ox4, complaining of hip pain to R hip. Patient states "I have no bones in my right knee so that is my bad side that I fell on."

## 2022-10-28 NOTE — Assessment & Plan Note (Addendum)
Given patient's mild tenderness in the distal aspect of the femur, I did request complete femur imaging.  I do not see any fractures there on my review.  Patient will be treated with DVT prophylaxis with Lovenox.  I have ordered standing pain medication including acetaminophen and OxyContin.  With as needed pain medication for oxycodone and IV Dilaudid.  Patient is not a candidate for Celebrex given CKD.  Ortho has been engaged by the ER provider and plan is for potential OR on Saturday.  Therefore diet has been ordered for the patient.  Gentle hydration has also been ordered

## 2022-10-28 NOTE — ED Provider Notes (Signed)
EMERGENCY DEPARTMENT AT [facility] Provider Note   CSN: [id number] Arrival date & time: 10/28/22  1650     History  Chief Complaint  Patient presents with   [name] is a 89 y.o. female with cardiomyopathy status post BiV/ICD, hypertension, atrial fibrillation, hypothyroidism, melanoma, anxiety and depression  who presents via [facility], patient tripped trying to help someone. Patient does not remember if she hit her head, endorses taking Xarelto. Patient A&Ox4, complaining of hip pain to R hip. Patient states "I have no bones in my right knee so that is my bad side that I fell on."   Patient was recently admitted from 09/04/2022 to 09/10/2022 for frequent falls.  Question failure to thrive, completed a course of IV Rocephin for possible UTI, was volume depleted and resuscitated.  Cardiology advised discontinuation of DOAC.  Had CT of right hip that demonstrated no acute displaced fracture but old healed fractures of right superior and inferior pubic rami.   Past Medical History:  Diagnosis Date   AAA (abdominal aortic aneurysm) (HCC)    Allergy    all year    Anxiety disorder    Arthritis    Barrett's esophagus 04/2008   Benign neoplasm of colon    Blood transfusion without reported diagnosis    long ago per pt   Cataract    removed both eyes   Chronic airway obstruction, not elsewhere classified    Chronic HFrEF (heart failure with reduced ejection fraction) (HCC)    Complication of anesthesia    slow to wake up   Depressive disorder, not elsewhere classified    Diverticulitis    Dysthymic disorder    Erosive gastritis 08/2004   Frequent urinary tract infections    GERD (gastroesophageal reflux disease)    Hair thinning    HTN (hypertension)    pt denies    Hyperplastic polyps of stomach 06/2002   Hypothyroidism    Iliac aneurysm (HCC)    Incisional hernia    Insomnia 08/20/2015   Kyphosis 08/20/2015   LBBB (left  bundle branch block)    LBBB (left bundle branch block)    Melanoma (HCC)    right arm   Mitral regurgitation    Nonischemic cardiomyopathy (HCC)    Osteopenia    Other and unspecified hyperlipidemia    Pacemaker 04/26/2012   PAF (paroxysmal atrial fibrillation) (HCC)    11/2016   Pain in joint, lower leg    Palpitations    Pneumonia    Restless legs syndrome (RLS)    Synovial cyst of popliteal space    Unspecified chronic bronchitis (HCC)    Unspecified nasal polyp    Unspecified sinusitis (chronic)        Home Medications Prior to Admission medications   Medication Sig Start Date End Date Taking? Authorizing Provider  acetaminophen (TYLENOL) 500 MG tablet Take 1,000 mg by mouth every 8 (eight) hours as needed for moderate pain or mild pain.    [provider]  Ascorbic Acid (VITAMIN C) 1000 MG tablet Take 1,000 mg by mouth at bedtime. Ester C    [provider]  Calcium Carbonate-Vitamin D (CALCIUM + D PO) Take 2 tablets by mouth daily. 1000 units Vitamin D 1200 mg calcium    [provider]  Cyanocobalamin (VITAMIN B 12) 500 MCG TABS Take 1,000 mcg by mouth daily.     [provider]  Digoxin 62.5 MCG TABS Take  0.0625 mg by mouth daily. 09/11/22 11/10/22  Barnetta Chapel, MD  feeding supplement (ENSURE ENLIVE / ENSURE PLUS) LIQD Take 237 mLs by mouth 2 (two) times daily between meals. 09/11/22   Barnetta Chapel, MD  levothyroxine (SYNTHROID) 100 MCG tablet Take 1 tablet (100 mcg total) by mouth daily before breakfast. E03.9 09/13/22   Frederica Kuster, MD  megestrol (MEGACE) 400 MG/10ML suspension Take 10 mLs (400 mg total) by mouth daily. 09/11/22   Barnetta Chapel, MD  metoprolol succinate (TOPROL-XL) 25 MG 24 hr tablet Take 0.5 tablets (12.5 mg total) by mouth 2 (two) times daily. 09/10/22 11/09/22  Barnetta Chapel, MD  Mouthwashes (MOUTH RINSE) LIQD solution 15 mLs by Mouth Rinse route as needed (for oral care). 09/10/22   Berton Mount I, MD  oxyCODONE (OXY IR/ROXICODONE) 5 MG immediate release tablet Take 1 tablet (5 mg total) by mouth every 4 (four) hours as needed for moderate pain or severe pain. 09/10/22   Barnetta Chapel, MD  Polyethyl Glycol-Propyl Glycol (SYSTANE) 0.4-0.3 % SOLN Place 1 drop into both eyes daily as needed (dry eyes).    [provider]  RABEprazole (ACIPHEX) 20 MG tablet Take one tablet by mouth once daily for stomach 03/12/22   Frederica Kuster, MD      Allergies    Ciprofloxacin, Hydrocodone, Cephalexin, Doxycycline, Escitalopram, Levofloxacin, Moxifloxacin, Ofloxacin, Sertraline, Sulfamethoxazole, Tape, Latex, and Neomycin    Review of Systems   Review of Systems A 10 point review of systems was performed and is negative unless otherwise reported in HPI.  Physical Exam Updated Vital Signs BP 100/87   Pulse 99   Temp 98 F (36.7 C) (Oral)   Resp 17   Ht 5\' 8"  (1.727 m)   Wt 54.4 kg   SpO2 94%   BMI 18.25 kg/m  Physical Exam General: Normal appearing elderly female, lying in bed.  HEENT: PERRLA, Sclera anicteric, MMM, trachea midline. NCAT.  Cardiology: irregular tachycardic rate, no murmurs/rubs/gallops. BL radial and DP pulses equal bilaterally.  No chest wall tenderness with patient or crepitus. Resp: Normal respiratory rate and effort. CTAB, no wheezes, rhonchi, crackles.  Abd: Soft, non-tender, non-distended. No rebound tenderness or guarding.  Pelvis: Pelvis stable and nontender MSK: No peripheral edema or signs of trauma. Extremities without deformity or TTP. No cyanosis or clubbing. Skin: warm, dry. No rashes or lesions. Back: No C, T, or L spine TTP.  Neuro: A&Ox4, CNs II-XII grossly intact. 5/5 strength in BL UEs. Good ROM and strength of left lower extremity.  Externally rotated and foreshortened right lower extremity with no range of motion of the right hip due to severe pain.  No tenderness outpatient or deformities noted of the right knee or lower leg.  Sensation grossly intact.  Psych: Normal mood and affect.   ED Results / Procedures / Treatments   Labs (all labs ordered are listed, but only abnormal results are displayed) Labs Reviewed  CBC WITH DIFFERENTIAL/PLATELET - Abnormal; Notable for the following components:      Result Value   WBC 10.8 (*)    RDW 16.3 (*)    Neutro Abs 8.3 (*)    Abs Immature Granulocytes 0.14 (*)    All other components within normal limits  BASIC METABOLIC PANEL - Abnormal; Notable for the following components:   CO2 21 (*)    Glucose, Bld 103 (*)    BUN 32 (*)    Creatinine, Ser 1.04 (*)  Calcium 8.7 (*)    GFR, Estimated 52 (*)    All other components within normal limits  URINALYSIS, ROUTINE W REFLEX MICROSCOPIC  CBC  CREATININE, SERUM  APTT  PROTIME-INR  BASIC METABOLIC PANEL  CBC  CBG MONITORING, ED    EKG EKG Interpretation Date/Time:  Thursday October 28 2022 18:30:42 EDT Ventricular Rate:  126 PR Interval:  57 QRS Duration:  153 QT Interval:  347 QTC Calculation: 503 R Axis:   -73  Text Interpretation: Afib with RVR with known LBBB LVH with secondary repolarization abnormality Similar to most recent EKG  Confirmed by Vivi Barrack [phone]) on 10/28/2022 7:17:27 PM  Radiology DG Hip Unilat W or Wo Pelvis 2-3 Views Right  Result Date: 10/28/2022 CLINICAL DATA:  Status post fall. EXAM: DG HIP (WITH OR WITHOUT PELVIS) 2-3V RIGHT COMPARISON:  September 05, 2022 and April 07, 2021 FINDINGS: There is an acute fracture deformity extending through the inter trochanteric region of the proximal right femur. Chronic fracture deformities of the right superior and right inferior pubic rami are seen. A 16 mm x 16 mm well-defined cortical opacity is seen overlying the soft tissues adjacent to the lateral aspect of the left hip. This represents a new finding when compared to the April 07, 2021 study and appears to originate from the tip of the left greater trochanter. There is no evidence of  dislocation. Degenerative changes seen involving both hips, in the form of joint space narrowing and acetabular sclerosis. Additional degenerative changes are seen within the visualized portion of the mid and lower lumbar spine. IMPRESSION: 1. Acute fracture of the proximal right femur. 2. Chronic fracture deformities of the right superior and right inferior pubic rami. 3. Displaced fracture fragment of indeterminate age which likely originates from the tip of the left greater trochanter. Electronically Signed   By: Aram Candela M.D.   On: 10/28/2022 18:44   DG Chest 2 View  Result Date: 10/28/2022 CLINICAL DATA:  Status post trip and fall. EXAM: CHEST - 2 VIEW COMPARISON:  September 04, 2022 FINDINGS: There is stable multi lead AICD positioning. The heart size and mediastinal contours are within normal limits. Marked severity calcification and tortuosity thoracic aorta is noted. There is no evidence of an acute infiltrate, pleural effusion or pneumothorax. Radiopaque surgical clips are seen overlying the left axilla. The visualized skeletal structures are unremarkable. IMPRESSION: No active cardiopulmonary disease. Electronically Signed   By: Aram Candela M.D.   On: 10/28/2022 18:37   CT Head Wo Contrast  Result Date: 10/28/2022 CLINICAL DATA:  Fall, hit head on Xarelto EXAM: CT HEAD WITHOUT CONTRAST TECHNIQUE: Contiguous axial images were obtained from the base of the skull through the vertex without intravenous contrast. RADIATION DOSE REDUCTION: This exam was performed according to the departmental dose-optimization program which includes automated exposure control, adjustment of the mA and/or kV according to patient size and/or use of iterative reconstruction technique. COMPARISON:  CT brain 09/05/2022, 04/07/2021 FINDINGS: Brain: No acute territorial infarction, hemorrhage or intracranial mass. Atrophy. Mild chronic small vessel ischemic changes of the white matter. Mildly prominent ventricles but  stable in size Vascular: No hyperdense vessels. Vertebral and carotid vascular calcification Skull: Normal. Negative for fracture or focal lesion. Sinuses/Orbits: No acute finding. Other: None IMPRESSION: No CT evidence for acute intracranial abnormality. Atrophy and chronic small vessel ischemic changes of the white matter. Electronically Signed   By: Jasmine Pang M.D.   On: 10/28/2022 18:26    Procedures Procedures  Medications Ordered in ED Medications  HYDROmorphone (DILAUDID) injection 0.25 mg (0.25 mg Intravenous Patient Refused/Not Given 10/28/22 1909)  ondansetron (ZOFRAN) injection 4 mg (4 mg Intravenous Patient Refused/Not Given 10/28/22 1909)  acetaminophen (TYLENOL) tablet 1,000 mg (has no administration in time range)  enoxaparin (LOVENOX) injection 40 mg (has no administration in time range)  0.9 %  sodium chloride infusion (has no administration in time range)  acetaminophen (TYLENOL) tablet 650 mg (has no administration in time range)    Or  acetaminophen (TYLENOL) suppository 650 mg (has no administration in time range)  polyethylene glycol (MIRALAX / GLYCOLAX) packet 17 g (has no administration in time range)  sodium chloride flush (NS) 0.9 % injection 3 mL (has no administration in time range)    ED Course/ Medical Decision Making/ A&P                          Medical Decision Making Amount and/or Complexity of Data Reviewed Labs: ordered. Decision-making details documented in ED Course. Radiology: ordered.  Risk Prescription drug management. Decision regarding hospitalization.    This patient presents to the ED for concern of fall R hip pain, this involves an extensive number of treatment options, and is a complaint that carries with it a high risk of complications and morbidity.  I considered the following differential and admission for this acute, potentially life threatening condition.   MDM:    For patient's hip pain and fall, consider hip dislocation vs  fracture or femur fracture. Neurovascularly intact at this time with good distal pulses. Will obtain XRs to evaluate.  Fall was mechanical and patient reported no chest pain, lightheadedness, palpitations or shortness of breath prior to the fall.  Patient has no pain elsewhere.  She does state that she may be hit her head and cannot exactly remember, reported that she is taking Xarelto though on her discharge summary recently it seems as though she was told to stop taking it due to her recurrent falls.  Gratefully CT head does not demonstrate any ICH.  Patient does not have any neck pain or midline C-spine tenderness, rules out by Nexus criteria.   Clinical Course as of 10/28/22 1916  Thu Oct 28, 2022  1825 R femoral neck fracture. Will give analgesia and consult to orthopedic surgery. [HN]  1826 CBC with Differential(!) Very mild luekocytosis like related to trauma. No anemia. [HN]  1827 Basic metabolic panel(!) No signfiicant changes from prior [HN]  1854 D/w ortho who states that patient will likely be operated on in a couple of days. Will admit to hospitalist. [HN]  1902 EKG demonstrates wide-complex irregular tachyarrhythmia.  She has a history of left bundle branch block and paroxysmal atrial fibrillation.  She also has a pacemaker/ICD implant.  Similar EKG was demonstrated on September 04, 2022 on her previous admission to the hospital and she was diagnosed with A-fib with RVR with aberrancy. Have placed order for interrogation of BiV/ICD and inquiring whether patient took her metoprolol this AM. If she did not, can likely give her home dose of metoprolol. Patient is normotensive, no CP, does not require emergent cardioversion. Patient is admitted to Dr. Samara Deist.  [HN]    Clinical Course User Index [HN] Loetta Rough, MD    Labs: I Ordered, and personally interpreted labs.  The pertinent results include:  those listed above  Imaging Studies ordered: I ordered imaging studies including CT  head, chest x-ray, right hip x-ray I  independently visualized and interpreted imaging. I agree with the radiologist interpretation  Additional history obtained from chart review.  Reevaluation: After the interventions noted above, I reevaluated the patient and found that they have :improved  Social Determinants of Health: Lives at facility  Disposition:  Admitted to hospitalist with ortho following  Co morbidities that complicate the patient evaluation  Past Medical History:  Diagnosis Date   AAA (abdominal aortic aneurysm) (HCC)    Allergy    all year    Anxiety disorder    Arthritis    Barrett's esophagus 04/2008   Benign neoplasm of colon    Blood transfusion without reported diagnosis    long ago per pt   Cataract    removed both eyes   Chronic airway obstruction, not elsewhere classified    Chronic HFrEF (heart failure with reduced ejection fraction) (HCC)    Complication of anesthesia    slow to wake up   Depressive disorder, not elsewhere classified    Diverticulitis    Dysthymic disorder    Erosive gastritis 08/2004   Frequent urinary tract infections    GERD (gastroesophageal reflux disease)    Hair thinning    HTN (hypertension)    pt denies    Hyperplastic polyps of stomach 06/2002   Hypothyroidism    Iliac aneurysm (HCC)    Incisional hernia    Insomnia 08/20/2015   Kyphosis 08/20/2015   LBBB (left bundle branch block)    LBBB (left bundle branch block)    Melanoma (HCC)    right arm   Mitral regurgitation    Nonischemic cardiomyopathy (HCC)    Osteopenia    Other and unspecified hyperlipidemia    Pacemaker 04/26/2012   PAF (paroxysmal atrial fibrillation) (HCC)    11/2016   Pain in joint, lower leg    Palpitations    Pneumonia    Restless legs syndrome (RLS)    Synovial cyst of popliteal space    Unspecified chronic bronchitis (HCC)    Unspecified nasal polyp    Unspecified sinusitis (chronic)      Medicines Meds ordered this encounter   Medications   HYDROmorphone (DILAUDID) injection 0.25 mg   ondansetron (ZOFRAN) injection 4 mg   acetaminophen (TYLENOL) tablet 1,000 mg   enoxaparin (LOVENOX) injection 40 mg   0.9 %  sodium chloride infusion   OR Linked Order Group    acetaminophen (TYLENOL) tablet 650 mg    acetaminophen (TYLENOL) suppository 650 mg   polyethylene glycol (MIRALAX / GLYCOLAX) packet 17 g   sodium chloride flush (NS) 0.9 % injection 3 mL    I have reviewed the patients home medicines and have made adjustments as needed  Problem List / ED Course: Problem List Items Addressed This Visit   None Visit Diagnoses     Fall in home, initial encounter    -  Primary   Closed fracture of proximal end of right femur, initial encounter (HCC)       Atrial fibrillation with rapid ventricular response (HCC)       Relevant Medications   enoxaparin (LOVENOX) injection 40 mg (Start on 10/29/2022  8:00 AM)                   This note was created using dictation software, which may contain spelling or grammatical errors.    Loetta Rough, MD 10/28/22 [phone]

## 2022-10-28 NOTE — Assessment & Plan Note (Signed)
Felt to be mechanicalat this time based on history.

## 2022-10-28 NOTE — Assessment & Plan Note (Signed)
At this time patient does not report any dysuria.  There is no fever.  Not thought to represent a true infection just yet.  Will monitor clinically

## 2022-10-28 NOTE — H&P (Signed)
History and Physical    Patient: [name] WUJ:[id number] DOB: 1932/11/01 DOA: 10/28/2022 DOS: the patient was seen and examined on 10/28/2022 PCP: [name], MD  Patient coming from: SNF  Chief Complaint:  Chief Complaint  Patient presents with   Fall   HPI: [name] is a 89 y.o. female with medical history significant of prior known atrial fibrillation as well as frequent falls.  Patient is a resident of skilled nursing facility.  Patient describes in great detail that earlier this morning at approximately 2 or 3 AM her roommate's phone was ringing and therefore she got up to fetch it, unfortunately in the dark she lost her orientation and fell on the floor as she was turning.  She denies any vertigo, focal weakness, loss of consciousness or presyncope or seizure-like activity or tremors.  However the fall was not witnessed,.  Patient reports that she was helped by staff at the nursing home back into her bed.  Patient had right hip area pain since then.  Patient has chronic low back pain, reports no worsening pain there.  Does not report any other new site of pain such as neck or arms or legs.  Eventually patient was transferred to [facility], ER at approximately 5 PM this evening.  Patient is reporting continued pain of the right hip area and the entire thigh.  ER workup has revealed right femoral fracture, medical evaluation is sought   Review of Systems: As mentioned in the history of present illness. All other systems reviewed and are negative. Past Medical History:  Diagnosis Date   AAA (abdominal aortic aneurysm) (HCC)    Allergy    all year    Anxiety disorder    Arthritis    Barrett's esophagus 04/2008   Benign neoplasm of colon    Blood transfusion without reported diagnosis    long ago per pt   Cataract    removed both eyes   Chronic airway obstruction, not elsewhere classified    Chronic HFrEF (heart failure with reduced ejection fraction) (HCC)     Complication of anesthesia    slow to wake up   Depressive disorder, not elsewhere classified    Diverticulitis    Dysthymic disorder    Erosive gastritis 08/2004   Frequent urinary tract infections    GERD (gastroesophageal reflux disease)    Hair thinning    HTN (hypertension)    pt denies    Hyperplastic polyps of stomach 06/2002   Hypothyroidism    Iliac aneurysm (HCC)    Incisional hernia    Insomnia 08/20/2015   Kyphosis 08/20/2015   LBBB (left bundle branch block)    LBBB (left bundle branch block)    Melanoma (HCC)    right arm   Mitral regurgitation    Nonischemic cardiomyopathy (HCC)    Osteopenia    Other and unspecified hyperlipidemia    Pacemaker 04/26/2012   PAF (paroxysmal atrial fibrillation) (HCC)    11/2016   Pain in joint, lower leg    Palpitations    Pneumonia    Restless legs syndrome (RLS)    Synovial cyst of popliteal space    Unspecified chronic bronchitis (HCC)    Unspecified nasal polyp    Unspecified sinusitis (chronic)    Past Surgical History:  Procedure Laterality Date   ABDOMINAL HYSTERECTOMY  1076   endometriosis   APPENDECTOMY     BI-VENTRICULAR PACEMAKER INSERTION N/A 04/26/2012   Procedure: BI-VENTRICULAR PACEMAKER INSERTION (CRT-P);  Surgeon: Marinus Maw, MD;  Location: [facility] CATH LAB;  Service: Cardiovascular;  Laterality: N/A;   BIV PACEMAKER GENERATOR CHANGEOUT N/A 01/26/2019   Procedure: BIV PACEMAKER GENERATOR CHANGEOUT;  Surgeon: Marinus Maw, MD;  Location: [facility] INVASIVE CV LAB;  Service: Cardiovascular;  Laterality: N/A;   CARDIOVERSION N/A 05/06/2020   Procedure: CARDIOVERSION;  Surgeon: Chrystie Nose, MD;  Location: [facility] ENDOSCOPY;  Service: Cardiovascular;  Laterality: N/A;   CARDIOVERSION N/A 05/19/2020   Procedure: CARDIOVERSION;  Surgeon: Jake Bathe, MD;  Location: [facility] ENDOSCOPY;  Service: Cardiovascular;  Laterality: N/A;   COLONOSCOPY     defibrillator insertion     s/p removal of previously implanted BiV ICD and  insertion of a new BiV pacemaker on 04/26/12   excise mole of lip  2001   Dr. Everlene Farrier   excision of wen  1984   Dr. Valrie Hart   FEMORAL HERNIA REPAIR  12/25/2012   Dr Derrell Lolling   FEMORAL HERNIA REPAIR  01/04/2013   Recurrent - Dr. Darin Engels HERNIA REPAIR N/A 10/01/2013   Procedure: LAPAROSCOPIC INCISIONAL HERNIA;  Surgeon: Atilano Ina, MD;  Location: [facility] ORS;  Service: General;  Laterality: N/A;   INCISIONAL HERNIA REPAIR N/A 09/07/2018   Procedure: LAPAROSCOPIC ASSISTED REPAIR OF INCISIONAL HERNIA;  Surgeon: Gaynelle Adu, MD;  Location: [facility] OR;  Service: General;  Laterality: N/A;   INGUINAL HERNIA REPAIR Right 09/26/2012   Procedure: RIGHT INGUINAL HERNIA REPAIR WITH MESH;  Surgeon: Adolph Pollack, MD;  Location: [facility] OR;  Service: General;  Laterality: Right;   INGUINAL HERNIA REPAIR Right 12/25/2012   Procedure: explor right groin, small bowel rescection, tissue repair right femoral hernia;  Surgeon: Ernestene Mention, MD;  Location: [facility] ORS;  Service: General;  Laterality: Right;   INSERTION OF MESH Right 09/26/2012   Procedure: INSERTION OF MESH;  Surgeon: Adolph Pollack, MD;  Location: [facility] OR;  Service: General;  Laterality: Right;   INSERTION OF MESH N/A 10/01/2013   Procedure: INSERTION OF MESH;  Surgeon: Atilano Ina, MD;  Location: [facility] ORS;  Service: General;  Laterality: N/A;   LAPAROSCOPY N/A 01/04/2013   Procedure: Diagnostic Laparoscopy, exploratory laparotomy with small bowel resection, closure of right femoral hernia repair;  Surgeon: Lodema Pilot, DO;  Location: [facility] ORS;  Service: General;  Laterality: N/A;   Left arm surgery     POLYPECTOMY     RIGHT BREAST LUMPECTOMY  1988   Dr. Francina Ames   right knee surgery     arthroscopy: Dr. Simonne Come   TONSILLECTOMY     UPPER GASTROINTESTINAL ENDOSCOPY     Social History:  reports that she quit smoking about 28 years ago. Her smoking use included cigarettes. She started smoking about 43 years ago. She has a 15 pack-year  smoking history. She has never used smokeless tobacco. She reports that she does not drink alcohol and does not use drugs.  Allergies  Allergen Reactions   Ciprofloxacin Other (See Comments)    Not indicated due to aneurysm in aorta     Hydrocodone Itching   Cephalexin Itching, Swelling and Other (See Comments)    Edema, too   Doxycycline Other (See Comments)    Unknown reaction   Escitalopram Other (See Comments)    Dry mouth   Levofloxacin Other (See Comments)    Not indicated due to aneurysm in aorta    Moxifloxacin Other (See Comments)    Not indicated due to aneurysm in aorta  Ofloxacin Other (See Comments)    Unknown reaction   Sertraline Other (See Comments)    Insomnia    Sulfamethoxazole Hives and Other (See Comments)    "Any sulfa meds"   Tape Other (See Comments)    Burns skin.    Latex Rash    "If pt. Makes contact with or wearing"   Neomycin Rash    Family History  Problem Relation Age of Onset   Stroke Father    Heart disease Other        maternal side   Colon cancer Paternal Uncle    Breast cancer Maternal Aunt    Colon cancer Cousin    Diabetes Neg Hx    Colon polyps Neg Hx    Rectal cancer Neg Hx    Stomach cancer Neg Hx     Prior to Admission medications   Medication Sig Start Date End Date Taking? Authorizing Provider  acetaminophen (TYLENOL) 500 MG tablet Take 1,000 mg by mouth every 8 (eight) hours as needed for moderate pain or mild pain.    [provider]  Ascorbic Acid (VITAMIN C) 1000 MG tablet Take 1,000 mg by mouth at bedtime. Ester C    [provider]  Calcium Carbonate-Vitamin D (CALCIUM + D PO) Take 2 tablets by mouth daily. 1000 units Vitamin D 1200 mg calcium    [provider]  Cyanocobalamin (VITAMIN B 12) 500 MCG TABS Take 1,000 mcg by mouth daily.     [provider]  Digoxin 62.5 MCG TABS Take 0.0625 mg by mouth daily. 09/11/22 11/10/22  Barnetta Chapel, MD  feeding supplement (ENSURE  ENLIVE / ENSURE PLUS) LIQD Take 237 mLs by mouth 2 (two) times daily between meals. 09/11/22   Barnetta Chapel, MD  levothyroxine (SYNTHROID) 100 MCG tablet Take 1 tablet (100 mcg total) by mouth daily before breakfast. E03.9 09/13/22   Frederica Kuster, MD  megestrol (MEGACE) 400 MG/10ML suspension Take 10 mLs (400 mg total) by mouth daily. 09/11/22   Barnetta Chapel, MD  metoprolol succinate (TOPROL-XL) 25 MG 24 hr tablet Take 0.5 tablets (12.5 mg total) by mouth 2 (two) times daily. 09/10/22 11/09/22  Barnetta Chapel, MD  Mouthwashes (MOUTH RINSE) LIQD solution 15 mLs by Mouth Rinse route as needed (for oral care). 09/10/22   Berton Mount I, MD  oxyCODONE (OXY IR/ROXICODONE) 5 MG immediate release tablet Take 1 tablet (5 mg total) by mouth every 4 (four) hours as needed for moderate pain or severe pain. 09/10/22   Barnetta Chapel, MD  Polyethyl Glycol-Propyl Glycol (SYSTANE) 0.4-0.3 % SOLN Place 1 drop into both eyes daily as needed (dry eyes).    [provider]  RABEprazole (ACIPHEX) 20 MG tablet Take one tablet by mouth once daily for stomach 03/12/22   Frederica Kuster, MD    Physical Exam: Vitals:   10/28/22 1702 10/28/22 1703 10/28/22 1900  BP:  137/87 100/87  Pulse:  99 99  Resp:  18 17  Temp:  98 F (36.7 C)   TempSrc:  Oral   SpO2:  95% 94%  Weight: 54.4 kg    Height: 5\' 8"  (1.727 m)     General: Thin appearing lady with guttering of forearms metacarpal guttering.  Pain is reasonably well-controlled with Tylenol.  Patient gives a coherent/detailed account of her history. Respiratory exam: Bilateral air entry vesicular, diminished Cardiovascular exam S1-S2 normal, irregular Abdomen all quadrants are soft nontender Pelvis no pelvic instability is noted  based on lateral compression from the crests.  There is marked tenderness along the distal aspect of the right femur.  I did not manipulate the right hip.  No clinical fracture of right leg or foot distal function  is intact.  All other extremities seem to be well-functioning.  No focal tenderness of cervical spine, neck movements are free.  I could not get to the low back or the mid back given patient's pain in the right hip area. Data Reviewed:  Labs on Admission:  Results for orders placed or performed during the hospital encounter of 10/28/22 (from the past 24 hour(s))  CBC with Differential     Status: Abnormal   Collection Time: 10/28/22  5:10 PM  Result Value Ref Range   WBC 10.8 (H) 4.0 - 10.5 K/uL   RBC 4.67 3.87 - 5.11 MIL/uL   Hemoglobin 13.1 12.0 - 15.0 g/dL   HCT 66.4 40.3 - 47.4 %   MCV 88.9 80.0 - 100.0 fL   MCH 28.1 26.0 - 34.0 pg   MCHC 31.6 30.0 - 36.0 g/dL   RDW 25.9 (H) 56.3 - 87.5 %   Platelets 243 150 - 400 K/uL   nRBC 0.0 0.0 - 0.2 %   Neutrophils Relative % 77 %   Neutro Abs 8.3 (H) 1.7 - 7.7 K/uL   Lymphocytes Relative 10 %   Lymphs Abs 1.1 0.7 - 4.0 K/uL   Monocytes Relative 9 %   Monocytes Absolute 0.9 0.1 - 1.0 K/uL   Eosinophils Relative 3 %   Eosinophils Absolute 0.3 0.0 - 0.5 K/uL   Basophils Relative 0 %   Basophils Absolute 0.0 0.0 - 0.1 K/uL   Immature Granulocytes 1 %   Abs Immature Granulocytes 0.14 (H) 0.00 - 0.07 K/uL  Basic metabolic panel     Status: Abnormal   Collection Time: 10/28/22  5:10 PM  Result Value Ref Range   Sodium 137 135 - 145 mmol/L   Potassium 4.3 3.5 - 5.1 mmol/L   Chloride 110 98 - 111 mmol/L   CO2 21 (L) 22 - 32 mmol/L   Glucose, Bld 103 (H) 70 - 99 mg/dL   BUN 32 (H) 8 - 23 mg/dL   Creatinine, Ser 6.43 (H) 0.44 - 1.00 mg/dL   Calcium 8.7 (L) 8.9 - 10.3 mg/dL   GFR, Estimated 52 (L) >60 mL/min   Anion gap 6 5 - 15  Urinalysis, Routine w reflex microscopic -Urine, Clean Catch     Status: Abnormal   Collection Time: 10/28/22  7:28 PM  Result Value Ref Range   Color, Urine YELLOW YELLOW   APPearance CLOUDY (A) CLEAR   Specific Gravity, Urine 1.017 1.005 - 1.030   pH 5.0 5.0 - 8.0   Glucose, UA NEGATIVE NEGATIVE mg/dL    Hgb urine dipstick NEGATIVE NEGATIVE   Bilirubin Urine NEGATIVE NEGATIVE   Ketones, ur NEGATIVE NEGATIVE mg/dL   Protein, ur 30 (A) NEGATIVE mg/dL   Nitrite NEGATIVE NEGATIVE   Leukocytes,Ua LARGE (A) NEGATIVE   RBC / HPF 6-10 0 - 5 RBC/hpf   WBC, UA >50 0 - 5 WBC/hpf   Bacteria, UA MANY (A) NONE SEEN   Squamous Epithelial / HPF 0-5 0 - 5 /HPF   WBC Clumps PRESENT    Basic Metabolic Panel: Recent Labs  Lab 10/28/22 1710  NA 137  K 4.3  CL 110  CO2 21*  GLUCOSE 103*  BUN 32*  CREATININE 1.04*  CALCIUM 8.7*   Liver  Function Tests: No results for input(s): "AST", "ALT", "ALKPHOS", "BILITOT", "PROT", "ALBUMIN" in the last 168 hours. No results for input(s): "LIPASE", "AMYLASE" in the last 168 hours. No results for input(s): "AMMONIA" in the last 168 hours. CBC: Recent Labs  Lab 10/28/22 1710  WBC 10.8*  NEUTROABS 8.3*  HGB 13.1  HCT 41.5  MCV 88.9  PLT 243   Cardiac Enzymes: No results for input(s): "CKTOTAL", "CKMB", "CKMBINDEX", "TROPONINIHS" in the last 168 hours.  BNP (last 3 results) No results for input(s): "PROBNP" in the last 8760 hours. CBG: No results for input(s): "GLUCAP" in the last 168 hours.  Radiological Exams on Admission:  DG Hip Unilat W or Wo Pelvis 2-3 Views Right  Result Date: 10/28/2022 CLINICAL DATA:  Status post fall. EXAM: DG HIP (WITH OR WITHOUT PELVIS) 2-3V RIGHT COMPARISON:  September 05, 2022 and April 07, 2021 FINDINGS: There is an acute fracture deformity extending through the inter trochanteric region of the proximal right femur. Chronic fracture deformities of the right superior and right inferior pubic rami are seen. A 16 mm x 16 mm well-defined cortical opacity is seen overlying the soft tissues adjacent to the lateral aspect of the left hip. This represents a new finding when compared to the April 07, 2021 study and appears to originate from the tip of the left greater trochanter. There is no evidence of dislocation. Degenerative  changes seen involving both hips, in the form of joint space narrowing and acetabular sclerosis. Additional degenerative changes are seen within the visualized portion of the mid and lower lumbar spine. IMPRESSION: 1. Acute fracture of the proximal right femur. 2. Chronic fracture deformities of the right superior and right inferior pubic rami. 3. Displaced fracture fragment of indeterminate age which likely originates from the tip of the left greater trochanter. Electronically Signed   By: Aram Candela M.D.   On: 10/28/2022 18:44   DG Chest 2 View  Result Date: 10/28/2022 CLINICAL DATA:  Status post trip and fall. EXAM: CHEST - 2 VIEW COMPARISON:  September 04, 2022 FINDINGS: There is stable multi lead AICD positioning. The heart size and mediastinal contours are within normal limits. Marked severity calcification and tortuosity thoracic aorta is noted. There is no evidence of an acute infiltrate, pleural effusion or pneumothorax. Radiopaque surgical clips are seen overlying the left axilla. The visualized skeletal structures are unremarkable. IMPRESSION: No active cardiopulmonary disease. Electronically Signed   By: Aram Candela M.D.   On: 10/28/2022 18:37   CT Head Wo Contrast  Result Date: 10/28/2022 CLINICAL DATA:  Fall, hit head on Xarelto EXAM: CT HEAD WITHOUT CONTRAST TECHNIQUE: Contiguous axial images were obtained from the base of the skull through the vertex without intravenous contrast. RADIATION DOSE REDUCTION: This exam was performed according to the departmental dose-optimization program which includes automated exposure control, adjustment of the mA and/or kV according to patient size and/or use of iterative reconstruction technique. COMPARISON:  CT brain 09/05/2022, 04/07/2021 FINDINGS: Brain: No acute territorial infarction, hemorrhage or intracranial mass. Atrophy. Mild chronic small vessel ischemic changes of the white matter. Mildly prominent ventricles but stable in size Vascular:  No hyperdense vessels. Vertebral and carotid vascular calcification Skull: Normal. Negative for fracture or focal lesion. Sinuses/Orbits: No acute finding. Other: None IMPRESSION: No CT evidence for acute intracranial abnormality. Atrophy and chronic small vessel ischemic changes of the white matter. Electronically Signed   By: Jasmine Pang M.D.   On: 10/28/2022 18:26    EKG: Independently reviewed. Afib rvr. Chronic  wide wrs complex.   Assessment and Plan: * Femoral fracture (HCC) Given patient's mild tenderness in the distal aspect of the femur, I did request complete femur imaging.  I do not see any fractures there on my review.  Patient will be treated with DVT prophylaxis with Lovenox.  I have ordered standing pain medication including acetaminophen and OxyContin.  With as needed pain medication for oxycodone and IV Dilaudid.  Patient is not a candidate for Celebrex given CKD.  Ortho has been engaged by the ER provider and plan is for potential OR on Saturday.  Therefore diet has been ordered for the patient.  Gentle hydration has also been ordered  Leukocytes in urine At this time patient does not report any dysuria.  There is no fever.  Not thought to represent a true infection just yet.  Will monitor clinically  Underweight Check TSH. Nutrition eval.  Fall at home, initial encounter Felt to be mechanicalat this time based on history.  Persistent atrial fibrillation (HCC) This is a chronic diagnosis.  With pacemaker in situ.  Patient does have some tachycardia today that I feel is likely related to her pain level.  I will continue with gentle hydration.  Patient takes metoprolol at home as well as digoxin as well as midodrine, will continue the regimen at home.  Patient is not on chronic anticoagulation due to frequent falls.      Advance Care Planning:   Code Status: DNR patient has a gold form at bedside which shows DNR.  This will obviously be respected.  Consults:  ortho  Family Communication: Patient attempted to call her acquaintance Michaelle Birks using my phone.  Unfortunately it went into voicemail.  Patient left a voicemail.  Severity of Illness: The appropriate patient status for this patient is INPATIENT. Inpatient status is judged to be reasonable and necessary in order to provide the required intensity of service to ensure the patient's safety. The patient's presenting symptoms, physical exam findings, and initial radiographic and laboratory data in the context of their chronic comorbidities is felt to place them at high risk for further clinical deterioration. Furthermore, it is not anticipated that the patient will be medically stable for discharge from the hospital within 2 midnights of admission.   * I certify that at the point of admission it is my clinical judgment that the patient will require inpatient hospital care spanning beyond 2 midnights from the point of admission due to high intensity of service, high risk for further deterioration and high frequency of surveillance required.*  Author: Nolberto Hanlon, MD 10/28/2022 7:51 PM  For on call review [email or web address].

## 2022-10-28 NOTE — Assessment & Plan Note (Signed)
This is a chronic diagnosis.  With pacemaker in situ.  Patient does have some tachycardia today that I feel is likely related to her pain level.  I will continue with gentle hydration.  Patient takes metoprolol at home as well as digoxin as well as midodrine, will continue the regimen at home.  Patient is not on chronic anticoagulation due to frequent falls.

## 2022-10-28 NOTE — Assessment & Plan Note (Signed)
Check TSH. Nutrition eval.

## 2022-10-28 NOTE — ED Notes (Signed)
Patient states she is not hurting right now and does not want anything

## 2022-10-29 ENCOUNTER — Encounter (HOSPITAL_COMMUNITY): Payer: Self-pay | Admitting: Internal Medicine

## 2022-10-29 DIAGNOSIS — I480 Paroxysmal atrial fibrillation: Secondary | ICD-10-CM | POA: Diagnosis not present

## 2022-10-29 DIAGNOSIS — S72024A Nondisplaced fracture of epiphysis (separation) (upper) of right femur, initial encounter for closed fracture: Secondary | ICD-10-CM | POA: Diagnosis not present

## 2022-10-29 LAB — BASIC METABOLIC PANEL
Anion gap: 10 (ref 5–15)
BUN: 28 mg/dL — ABNORMAL HIGH (ref 8–23)
CO2: 18 mmol/L — ABNORMAL LOW (ref 22–32)
Calcium: 8.9 mg/dL (ref 8.9–10.3)
Chloride: 109 mmol/L (ref 98–111)
Creatinine, Ser: 0.89 mg/dL (ref 0.44–1.00)
GFR, Estimated: >60 mL/min (ref >60)
Glucose, Bld: 96 mg/dL (ref 70–99)
Potassium: 3.9 mmol/L (ref 3.5–5.1)
Sodium: 137 mmol/L (ref 135–145)

## 2022-10-29 LAB — CBC
HCT: 38.8 % (ref 36.0–46.0)
Hemoglobin: 12.1 g/dL (ref 12.0–15.0)
MCH: 27.8 pg (ref 26.0–34.0)
MCHC: 31.2 g/dL (ref 30.0–36.0)
MCV: 89.2 fL (ref 80.0–100.0)
Platelets: 194 10*3/uL (ref 150–400)
RBC: 4.35 MIL/uL (ref 3.87–5.11)
RDW: 16.3 % — ABNORMAL HIGH (ref 11.5–15.5)
WBC: 11.8 10*3/uL — ABNORMAL HIGH (ref 4.0–10.5)
nRBC: 0 % (ref 0.0–0.2)

## 2022-10-29 LAB — HEPATIC FUNCTION PANEL
ALT: 14 U/L (ref 0–44)
AST: 21 U/L (ref 15–41)
Albumin: 3.5 g/dL (ref 3.5–5.0)
Alkaline Phosphatase: 67 U/L (ref 38–126)
Bilirubin, Direct: 0.1 mg/dL (ref 0.0–0.2)
Indirect Bilirubin: 0.8 mg/dL (ref 0.3–0.9)
Total Bilirubin: 0.9 mg/dL (ref 0.3–1.2)
Total Protein: 6.3 g/dL — ABNORMAL LOW (ref 6.5–8.1)

## 2022-10-29 LAB — DIGOXIN LEVEL: Digoxin Level: 1.4 ng/mL (ref 0.8–2.0)

## 2022-10-29 LAB — SURGICAL PCR SCREEN
MRSA, PCR: NEGATIVE
Staphylococcus aureus: NEGATIVE

## 2022-10-29 LAB — APTT: aPTT: 29 seconds (ref 24–36)

## 2022-10-29 LAB — PROTIME-INR
INR: 1.3 — ABNORMAL HIGH (ref 0.8–1.2)
Prothrombin Time: 16.5 seconds — ABNORMAL HIGH (ref 11.4–15.2)

## 2022-10-29 LAB — TSH: TSH: 5.053 u[IU]/mL — ABNORMAL HIGH (ref 0.350–4.500)

## 2022-10-29 MED ORDER — ENSURE ENLIVE PO LIQD
237.0000 mL | Freq: Two times a day (BID) | ORAL | Status: DC
  Administered 2022-10-29 – 2022-11-03 (×5): 237 mL via ORAL

## 2022-10-29 MED ORDER — ADULT MULTIVITAMIN W/MINERALS CH
1.0000 | ORAL_TABLET | Freq: Every day | ORAL | Status: DC
  Administered 2022-10-29 – 2022-11-03 (×5): 1 via ORAL
  Filled 2022-10-29 (×5): qty 1

## 2022-10-29 MED ORDER — ORAL CARE MOUTH RINSE: 15.0000 mL | OROMUCOSAL | Status: DC | PRN

## 2022-10-29 NOTE — ED Notes (Signed)
ED TO INPATIENT HANDOFF REPORT  ED Nurse Name and Phone #: Richarda Osmond Name/Age/Gender [name] 89 y.o. female Room/Bed: WA21/WA21  Code Status   Code Status: DNR  Home/SNF/Other Rehab Patient oriented to: self Is this baseline? Yes   Triage Complete: Triage complete  Chief Complaint Femoral fracture [facility]) [S72.90XA]  Triage Note Patient arrived via [facility], patient tripped trying to help someone. Patient does not remember if she hit her head, endorses taking Xarelto. Patient A&Ox4, complaining of hip pain to R hip. Patient states "I have no bones in my right knee so that is my bad side that I fell on."    Allergies Allergies  Allergen Reactions   Ciprofloxacin Other (See Comments)    Not indicated due to aneurysm in aorta     Hydrocodone Itching   Levofloxacin Other (See Comments)    Not indicated due to aneurysm in aorta    Moxifloxacin Other (See Comments)    Not indicated due to aneurysm in aorta    Cephalexin Itching, Swelling and Other (See Comments)    Edema, too   Doxycycline Other (See Comments)    Unknown reaction   Escitalopram Other (See Comments)    Dry mouth   Ofloxacin Other (See Comments)    "Allergic," per facility   Sertraline Other (See Comments)    Insomnia    Sulfamethoxazole Hives and Other (See Comments)    "Any sulfa meds"   Tape Other (See Comments)    Burns skin.    Latex Rash and Other (See Comments)    "If pt. Makes contact with or wearing"   Neomycin Rash    Level of Care/Admitting Diagnosis ED Disposition     ED Disposition  Admit   Condition  --   Comment  Hospital Area: [facility] [[id number]]  Level of Care: Telemetry [5]  Admit to tele based on following criteria: Monitor QTC interval  May admit patient to [facility] or [facility] if equivalent level of care is available:: No  Covid Evaluation: Asymptomatic - no recent exposure (last 10 days) testing not required  Diagnosis:  Femoral fracture [facility]) [[id number]]  Admitting Physician: Nolberto Hanlon [[id number]]  Attending Physician: Nolberto Hanlon [[id number]]  Certification:: I certify this patient will need inpatient services for at least 2 midnights  Expected Medical Readiness: 11/01/2022          B Medical/Surgery History Past Medical History:  Diagnosis Date   AAA (abdominal aortic aneurysm) (HCC)    Allergy    all year    Anxiety disorder    Arthritis    Barrett's esophagus 04/2008   Benign neoplasm of colon    Blood transfusion without reported diagnosis    long ago per pt   Cataract    removed both eyes   Chronic airway obstruction, not elsewhere classified    Chronic HFrEF (heart failure with reduced ejection fraction) (HCC)    Complication of anesthesia    slow to wake up   Depressive disorder, not elsewhere classified    Diverticulitis    Dysthymic disorder    Erosive gastritis 08/2004   Frequent urinary tract infections    GERD (gastroesophageal reflux disease)    Hair thinning    HTN (hypertension)    pt denies    Hyperplastic polyps of stomach 06/2002   Hypothyroidism    Iliac aneurysm (HCC)    Incisional hernia    Insomnia 08/20/2015   Kyphosis 08/20/2015   LBBB (  left bundle branch block)    LBBB (left bundle branch block)    Melanoma (HCC)    right arm   Mitral regurgitation    Nonischemic cardiomyopathy (HCC)    Osteopenia    Other and unspecified hyperlipidemia    Pacemaker 04/26/2012   PAF (paroxysmal atrial fibrillation) (HCC)    11/2016   Pain in joint, lower leg    Palpitations    Pneumonia    Restless legs syndrome (RLS)    Synovial cyst of popliteal space    Unspecified chronic bronchitis (HCC)    Unspecified nasal polyp    Unspecified sinusitis (chronic)    Past Surgical History:  Procedure Laterality Date   ABDOMINAL HYSTERECTOMY  1076   endometriosis   APPENDECTOMY     BI-VENTRICULAR PACEMAKER INSERTION N/A 04/26/2012   Procedure: BI-VENTRICULAR PACEMAKER  INSERTION (CRT-P);  Surgeon: Marinus Maw, MD;  Location: [facility] CATH LAB;  Service: Cardiovascular;  Laterality: N/A;   BIV PACEMAKER GENERATOR CHANGEOUT N/A 01/26/2019   Procedure: BIV PACEMAKER GENERATOR CHANGEOUT;  Surgeon: Marinus Maw, MD;  Location: [facility] INVASIVE CV LAB;  Service: Cardiovascular;  Laterality: N/A;   CARDIOVERSION N/A 05/06/2020   Procedure: CARDIOVERSION;  Surgeon: Chrystie Nose, MD;  Location: [facility] ENDOSCOPY;  Service: Cardiovascular;  Laterality: N/A;   CARDIOVERSION N/A 05/19/2020   Procedure: CARDIOVERSION;  Surgeon: Jake Bathe, MD;  Location: [facility] ENDOSCOPY;  Service: Cardiovascular;  Laterality: N/A;   COLONOSCOPY     defibrillator insertion     s/p removal of previously implanted BiV ICD and insertion of a new BiV pacemaker on 04/26/12   excise mole of lip  2001   Dr. Everlene Farrier   excision of wen  1984   Dr. Valrie Hart   FEMORAL HERNIA REPAIR  12/25/2012   Dr Derrell Lolling   FEMORAL HERNIA REPAIR  01/04/2013   Recurrent - Dr. Darin Engels HERNIA REPAIR N/A 10/01/2013   Procedure: LAPAROSCOPIC INCISIONAL HERNIA;  Surgeon: Atilano Ina, MD;  Location: [facility] ORS;  Service: General;  Laterality: N/A;   INCISIONAL HERNIA REPAIR N/A 09/07/2018   Procedure: LAPAROSCOPIC ASSISTED REPAIR OF INCISIONAL HERNIA;  Surgeon: Gaynelle Adu, MD;  Location: [facility] OR;  Service: General;  Laterality: N/A;   INGUINAL HERNIA REPAIR Right 09/26/2012   Procedure: RIGHT INGUINAL HERNIA REPAIR WITH MESH;  Surgeon: Adolph Pollack, MD;  Location: [facility] OR;  Service: General;  Laterality: Right;   INGUINAL HERNIA REPAIR Right 12/25/2012   Procedure: explor right groin, small bowel rescection, tissue repair right femoral hernia;  Surgeon: Ernestene Mention, MD;  Location: [facility] ORS;  Service: General;  Laterality: Right;   INSERTION OF MESH Right 09/26/2012   Procedure: INSERTION OF MESH;  Surgeon: Adolph Pollack, MD;  Location: [facility] OR;  Service: General;  Laterality: Right;   INSERTION OF MESH N/A  10/01/2013   Procedure: INSERTION OF MESH;  Surgeon: Atilano Ina, MD;  Location: [facility] ORS;  Service: General;  Laterality: N/A;   LAPAROSCOPY N/A 01/04/2013   Procedure: Diagnostic Laparoscopy, exploratory laparotomy with small bowel resection, closure of right femoral hernia repair;  Surgeon: Lodema Pilot, DO;  Location: [facility] ORS;  Service: General;  Laterality: N/A;   Left arm surgery     POLYPECTOMY     RIGHT BREAST LUMPECTOMY  1988   Dr. Francina Ames   right knee surgery     arthroscopy: Dr. Simonne Come   TONSILLECTOMY     UPPER GASTROINTESTINAL ENDOSCOPY  A IV Location/Drains/Wounds Patient Lines/Drains/Airways Status     Active Line/Drains/Airways     Name Placement date Placement time Site Days   Peripheral IV 10/28/22 Anterior;Right Forearm 10/28/22  1841  Forearm  1   Wound / Incision (Open or Dehisced) 04/08/21 Sacrum Mid stage one 04/08/21  1626  Sacrum  569            Intake/Output Last 24 hours No intake or output data in the 24 hours ending 10/29/22 0827  Labs/Imaging Results for orders placed or performed during the hospital encounter of 10/28/22 (from the past 48 hour(s))  CBC with Differential     Status: Abnormal   Collection Time: 10/28/22  5:10 PM  Result Value Ref Range   WBC 10.8 (H) 4.0 - 10.5 K/uL   RBC 4.67 3.87 - 5.11 MIL/uL   Hemoglobin 13.1 12.0 - 15.0 g/dL   HCT 16.1 09.6 - 04.5 %   MCV 88.9 80.0 - 100.0 fL   MCH 28.1 26.0 - 34.0 pg   MCHC 31.6 30.0 - 36.0 g/dL   RDW 40.9 (H) 81.1 - 91.4 %   Platelets 243 150 - 400 K/uL   nRBC 0.0 0.0 - 0.2 %   Neutrophils Relative % 77 %   Neutro Abs 8.3 (H) 1.7 - 7.7 K/uL   Lymphocytes Relative 10 %   Lymphs Abs 1.1 0.7 - 4.0 K/uL   Monocytes Relative 9 %   Monocytes Absolute 0.9 0.1 - 1.0 K/uL   Eosinophils Relative 3 %   Eosinophils Absolute 0.3 0.0 - 0.5 K/uL   Basophils Relative 0 %   Basophils Absolute 0.0 0.0 - 0.1 K/uL   Immature Granulocytes 1 %   Abs Immature Granulocytes 0.14 (H) 0.00  - 0.07 K/uL    Comment: Performed at [facility], [address], [place], [place] [address]  Basic metabolic panel     Status: Abnormal   Collection Time: 10/28/22  5:10 PM  Result Value Ref Range   Sodium 137 135 - 145 mmol/L   Potassium 4.3 3.5 - 5.1 mmol/L   Chloride 110 98 - 111 mmol/L   CO2 21 (L) 22 - 32 mmol/L   Glucose, Bld 103 (H) 70 - 99 mg/dL    Comment: Glucose reference range applies only to samples taken after fasting for at least 8 hours.   BUN 32 (H) 8 - 23 mg/dL   Creatinine, Ser 6.21 (H) 0.44 - 1.00 mg/dL   Calcium 8.7 (L) 8.9 - 10.3 mg/dL   GFR, Estimated 52 (L) >60 mL/min    Comment: (NOTE) Calculated using the CKD-EPI Creatinine Equation (2021)    Anion gap 6 5 - 15    Comment: Performed at [facility], [address] [place], [place] [address]  Urinalysis, Routine w reflex microscopic -Urine, Clean Catch     Status: Abnormal   Collection Time: 10/28/22  7:28 PM  Result Value Ref Range   Color, Urine YELLOW YELLOW   APPearance CLOUDY (A) CLEAR   Specific Gravity, Urine 1.017 1.005 - 1.030   pH 5.0 5.0 - 8.0   Glucose, UA NEGATIVE NEGATIVE mg/dL   Hgb urine dipstick NEGATIVE NEGATIVE   Bilirubin Urine NEGATIVE NEGATIVE   Ketones, ur NEGATIVE NEGATIVE mg/dL   Protein, ur 30 (A) NEGATIVE mg/dL   Nitrite NEGATIVE NEGATIVE   Leukocytes,Ua LARGE (A) NEGATIVE   RBC / HPF 6-10 0 - 5 RBC/hpf   WBC, UA >50 0 - 5 WBC/hpf  Bacteria, UA MANY (A) NONE SEEN   Squamous Epithelial / HPF 0-5 0 - 5 /HPF   WBC Clumps PRESENT     Comment: Performed at [facility], [address], [place], [place] [address]  CBC     Status: Abnormal   Collection Time: 10/28/22  7:49 PM  Result Value Ref Range   WBC 13.4 (H) 4.0 - 10.5 K/uL   RBC 4.74 3.87 - 5.11 MIL/uL   Hemoglobin 13.5 12.0 - 15.0 g/dL   HCT 44.0 34.7 - 42.5 %   MCV 88.6 80.0 - 100.0 fL   MCH 28.5 26.0 - 34.0 pg   MCHC 32.1 30.0 - 36.0 g/dL   RDW 95.6 (H)  38.7 - 15.5 %   Platelets 228 150 - 400 K/uL   nRBC 0.0 0.0 - 0.2 %    Comment: Performed at [facility], [address], [place], [place] [address]  Creatinine, serum     Status: None   Collection Time: 10/28/22  7:49 PM  Result Value Ref Range   Creatinine, Ser 0.86 0.44 - 1.00 mg/dL   GFR, Estimated >29 >51 mL/min    Comment: (NOTE) Calculated using the CKD-EPI Creatinine Equation (2021) Performed at [facility], [address], [place], [place] [address]   Basic metabolic panel     Status: Abnormal   Collection Time: 10/29/22  5:03 AM  Result Value Ref Range   Sodium 137 135 - 145 mmol/L   Potassium 3.9 3.5 - 5.1 mmol/L   Chloride 109 98 - 111 mmol/L   CO2 18 (L) 22 - 32 mmol/L   Glucose, Bld 96 70 - 99 mg/dL    Comment: Glucose reference range applies only to samples taken after fasting for at least 8 hours.   BUN 28 (H) 8 - 23 mg/dL   Creatinine, Ser 6.06 0.44 - 1.00 mg/dL   Calcium 8.9 8.9 - 30.1 mg/dL   GFR, Estimated >60 >10 mL/min    Comment: (NOTE) Calculated using the CKD-EPI Creatinine Equation (2021)    Anion gap 10 5 - 15    Comment: Performed at [facility], [address] [place], [place] [address]  CBC     Status: Abnormal   Collection Time: 10/29/22  5:03 AM  Result Value Ref Range   WBC 11.8 (H) 4.0 - 10.5 K/uL   RBC 4.35 3.87 - 5.11 MIL/uL   Hemoglobin 12.1 12.0 - 15.0 g/dL   HCT 57.3 22.0 - 25.4 %   MCV 89.2 80.0 - 100.0 fL   MCH 27.8 26.0 - 34.0 pg   MCHC 31.2 30.0 - 36.0 g/dL   RDW 27.0 (H) 62.3 - 76.2 %   Platelets 194 150 - 400 K/uL   nRBC 0.0 0.0 - 0.2 %    Comment: Performed at [facility], [address] [place], [place] [address]  TSH     Status: Abnormal   Collection Time: 10/29/22  5:03 AM  Result Value Ref Range   TSH 5.053 (H) 0.350 - 4.500 uIU/mL    Comment: Performed by a 3rd Generation assay with a functional sensitivity of <=0.01 uIU/mL. Performed  at [facility], [address], [place], [place] [address]   Hepatic function panel     Status: Abnormal   Collection Time: 10/29/22  5:03 AM  Result Value Ref Range   Total Protein 6.3 (L) 6.5 - 8.1 g/dL   Albumin 3.5 3.5 -  5.0 g/dL   AST 21 15 - 41 U/L   ALT 14 0 - 44 U/L   Alkaline Phosphatase 67 38 - 126 U/L   Total Bilirubin 0.9 0.3 - 1.2 mg/dL   Bilirubin, Direct 0.1 0.0 - 0.2 mg/dL   Indirect Bilirubin 0.8 0.3 - 0.9 mg/dL    Comment: Performed at [facility], [address] [place], [place] [address]  Digoxin level     Status: None   Collection Time: 10/29/22  5:12 AM  Result Value Ref Range   Digoxin Level 1.4 0.8 - 2.0 ng/mL    Comment: Performed at [facility], [address], [place], [place] [address]  Protime-INR     Status: Abnormal   Collection Time: 10/29/22  5:12 AM  Result Value Ref Range   Prothrombin Time 16.5 (H) 11.4 - 15.2 seconds   INR 1.3 (H) 0.8 - 1.2    Comment: (NOTE) INR goal varies based on device and disease states. Performed at [facility], [address], [place], [place] [address]   APTT     Status: None   Collection Time: 10/29/22  5:12 AM  Result Value Ref Range   aPTT 29 24 - 36 seconds    Comment: Performed at [facility], [address] [place], [place] [address]   DG FEMUR PORT, MIN 2 VIEWS RIGHT  Result Date: 10/28/2022 CLINICAL DATA:  Additional imaging for right hip pain after fall EXAM: RIGHT FEMUR PORTABLE 2 VIEW COMPARISON:  Radiographs 10/28/2022 FINDINGS: Nondisplaced right intertrochanteric fracture. Remote fracture deformities about the right inferior superior pubic rami. No distal femur fracture. Right knee osteoarthritis with advanced lateral compartment narrowing. IMPRESSION: Redemonstrated nondisplaced right intertrochanteric fracture. Right knee osteoarthritis with advanced lateral compartment narrowing. Electronically Signed    By: Minerva Fester M.D.   On: 10/28/2022 20:34   DG Hip Unilat W or Wo Pelvis 2-3 Views Right  Result Date: 10/28/2022 CLINICAL DATA:  Status post fall. EXAM: DG HIP (WITH OR WITHOUT PELVIS) 2-3V RIGHT COMPARISON:  September 05, 2022 and April 07, 2021 FINDINGS: There is an acute fracture deformity extending through the inter trochanteric region of the proximal right femur. Chronic fracture deformities of the right superior and right inferior pubic rami are seen. A 16 mm x 16 mm well-defined cortical opacity is seen overlying the soft tissues adjacent to the lateral aspect of the left hip. This represents a new finding when compared to the April 07, 2021 study and appears to originate from the tip of the left greater trochanter. There is no evidence of dislocation. Degenerative changes seen involving both hips, in the form of joint space narrowing and acetabular sclerosis. Additional degenerative changes are seen within the visualized portion of the mid and lower lumbar spine. IMPRESSION: 1. Acute fracture of the proximal right femur. 2. Chronic fracture deformities of the right superior and right inferior pubic rami. 3. Displaced fracture fragment of indeterminate age which likely originates from the tip of the left greater trochanter. Electronically Signed   By: Aram Candela M.D.   On: 10/28/2022 18:44   DG Chest 2 View  Result Date: 10/28/2022 CLINICAL DATA:  Status post trip and fall. EXAM: CHEST - 2 VIEW COMPARISON:  September 04, 2022 FINDINGS: There is stable multi lead AICD positioning. The heart size and mediastinal contours are within normal limits. Marked severity calcification and tortuosity thoracic aorta is noted. There is no evidence of an acute infiltrate, pleural effusion  or pneumothorax. Radiopaque surgical clips are seen overlying the left axilla. The visualized skeletal structures are unremarkable. IMPRESSION: No active cardiopulmonary disease. Electronically Signed   By: Aram Candela M.D.   On: 10/28/2022 18:37   CT Head Wo Contrast  Result Date: 10/28/2022 CLINICAL DATA:  Fall, hit head on Xarelto EXAM: CT HEAD WITHOUT CONTRAST TECHNIQUE: Contiguous axial images were obtained from the base of the skull through the vertex without intravenous contrast. RADIATION DOSE REDUCTION: This exam was performed according to the departmental dose-optimization program which includes automated exposure control, adjustment of the mA and/or kV according to patient size and/or use of iterative reconstruction technique. COMPARISON:  CT brain 09/05/2022, 04/07/2021 FINDINGS: Brain: No acute territorial infarction, hemorrhage or intracranial mass. Atrophy. Mild chronic small vessel ischemic changes of the white matter. Mildly prominent ventricles but stable in size Vascular: No hyperdense vessels. Vertebral and carotid vascular calcification Skull: Normal. Negative for fracture or focal lesion. Sinuses/Orbits: No acute finding. Other: None IMPRESSION: No CT evidence for acute intracranial abnormality. Atrophy and chronic small vessel ischemic changes of the white matter. Electronically Signed   By: Jasmine Pang M.D.   On: 10/28/2022 18:26    Pending Labs Unresulted Labs (From admission, onward)    None       Vitals/Pain Today's Vitals   10/29/22 0526 10/29/22 0549 10/29/22 0700 10/29/22 0711  BP:   (!) 131/108 112/79  Pulse:    (!) 113  Resp:   18 (!) 21  Temp: 98.7 F (37.1 C)     TempSrc: Oral     SpO2:    99%  Weight:      Height:      PainSc:  10-Worst pain ever      Isolation Precautions No active isolations  Medications Medications  ondansetron (ZOFRAN) injection 4 mg (4 mg Intravenous Patient Refused/Not Given 10/28/22 1909)  polyethylene glycol (MIRALAX / GLYCOLAX) packet 17 g (has no administration in time range)  sodium chloride flush (NS) 0.9 % injection 3 mL (3 mLs Intravenous Given 10/28/22 2230)  acetaminophen (TYLENOL) tablet 650 mg (650 mg Oral Given  10/29/22 0438)  oxyCODONE (OXYCONTIN) 12 hr tablet 10 mg (10 mg Oral Given 10/29/22 0321)  HYDROmorphone (DILAUDID) injection 0.25 mg (has no administration in time range)  oxyCODONE (Oxy IR/ROXICODONE) immediate release tablet 5 mg (has no administration in time range)  senna-docusate (Senokot-S) tablet 2 tablet (2 tablets Oral Given 10/28/22 2039)  calcium carbonate (OS-CAL - dosed in mg of elemental calcium) tablet 1,250 mg (has no administration in time range)  cholecalciferol (VITAMIN D3) 25 MCG (1000 UNIT) tablet 1,000 Units (1,000 Units Oral Given 10/28/22 2041)  digoxin (LANOXIN) tablet 0.125 mg (has no administration in time range)  levothyroxine (SYNTHROID) tablet 100 mcg (has no administration in time range)  metoprolol succinate (TOPROL-XL) 24 hr tablet 12.5 mg (12.5 mg Oral Given 10/28/22 2041)  midodrine (PROAMATINE) tablet 5 mg (has no administration in time range)  pantoprazole (PROTONIX) EC tablet 20 mg (20 mg Oral Given 10/28/22 2040)  naphazoline-glycerin (CLEAR EYES REDNESS) ophth solution 1-2 drop (has no administration in time range)  cyanocobalamin (VITAMIN B12) tablet 1,000 mcg (has no administration in time range)    Mobility non-ambulatory     Focused Assessments     R Recommendations: See Admitting Provider Note  Report given to:   Additional Notes:

## 2022-10-29 NOTE — Hospital Course (Signed)
Brief hospital course: PMH of A-fib, chronic HFrEF, Barrett's esophagus, HTN, kyphoplasty, BiV pacemaker plan, hypothyroidism, GERD. Presented to the hospital with complaints of mechanical fall with injury to right hip. Found to have right femur fracture. Orthopedic consulted. Cardiology was consulted for preop clearance. Underwent right IM nailing 8/24.  Assessment and Plan: Right intertrochanteric hip fracture. Orthopedic consulted. Underwent right IM nailing on 8/24. Developed RVR postoperatively. Monitor for now.  Appreciate cardiology consultation.  Chronic HFrEF. Moderate MR. Severe TR. Paroxysmal A-fib.  Now with RVR Preop risk evaluation. Appreciate cardiology consultation for preop evaluation. While the patient is a high risk for surgical candidacy, there is no prohibitive risk per cardiology. EF 25%. On Xarelto which is currently on hold. On digoxin which I will continue.  On metoprolol which I will continue. Is a level adequate. Due to RVR patient was given IV Lopressor which did not slow her heart rate down and therefore now on IV amiodarone.  Hypertension chronic. On midodrine.  Continue.  BiV PPM implant. Monitor for now.  Severe malnutrition. Body mass index is 18.25 kg/m.  Will have dietitian follow the patient.   Delirium. Likely postop. Monitor for now. Xanax as needed.

## 2022-10-29 NOTE — Progress Notes (Signed)
[facility] Progress Note Patient: [name] IHK:[id number] DOB: Jul 19, 1932 DOA: 10/28/2022  DOS: the patient was seen and examined on 10/29/2022  Brief hospital course: PMH of A-fib, chronic HFrEF, Barrett's esophagus, HTN, kyphoplasty, BiV pacemaker plan, hypothyroidism, GERD. Presented to the hospital with complaints of mechanical fall with injury to right hip. Found to have right femur fracture. Orthopedic consulted. Cardiology was consulted for preop clearance. On anticoagulation which is currently on hold.  Surgery scheduled on Saturday. Assessment and Plan: Right intertrochanteric hip fracture. Orthopedic consulted. Currently n.p.o. after midnight 4 OR tomorrow. On examination no significant pain at the time of right knee no. Appreciate cardiology consultation.  Chronic HFrEF. Moderate MR. Severe TR. Paroxysmal A-fib. Preop risk evaluation. Appreciate cardiology consultation for preop evaluation. While the patient is a high risk for surgical candidacy, there is no prohibitive risk per cardiology. EF 25%. Monitor this appears to be adequate for now. Monitor. On Xarelto which is currently on hold. Patient was actually recommended to stop taking Xarelto recently. On digoxin which I will continue.  On metoprolol which I will continue. Rate well-controlled for now.  At risk for RVR.  Hypertension chronic. On midodrine.  Continue.  BiV PPM implant. Monitor for now.  Severe malnutrition. Body mass index is 18.25 kg/m.  Will have dietitian follow the patient.    Subjective: Pain well-controlled.  No nausea no vomiting no fever no chills.  Physical Exam: General: in Mild distress, No Rash Cardiovascular: S1 and S2 Present, No Murmur Respiratory: Good respiratory effort, Bilateral Air entry present. No Crackles, No wheezes Abdomen: Bowel Sound present, No tenderness Extremities: No edema Neuro: Alert and oriented x3, no new focal deficit  Data Reviewed: I  have Reviewed nursing notes, Vitals, and Lab results. Since last encounter, pertinent lab results CBC and BMP   . I have ordered test including CBC and BMP  .   Disposition: Status is: Inpatient Remains inpatient appropriate because: Monitor for postop recovery  SCDs Start: 10/28/22 1912   Family Communication: No one at bedside Level of care: Telemetry   Vitals:   10/29/22 0526 10/29/22 0700 10/29/22 0711 10/29/22 0901  BP:  (!) 131/108 112/79 121/88  Pulse:   (!) 113 93  Resp:  18 (!) 21 (!) 22  Temp: 98.7 F (37.1 C)   98 F (36.7 C)  TempSrc: Oral   Oral  SpO2:   99% 100%  Weight:      Height:         Author: Lynden Oxford, MD 10/29/2022 6:04 PM  Please look on [email or web address] to find out who is on call.

## 2022-10-29 NOTE — H&P (View-Only) (Signed)
Reason for Consult: right hip fracture Referring Physician: Allena Katz, MD  [name] is an 89 y.o. female.  HPI:  [name] is a 89 y.o. female with medical history significant of prior known atrial fibrillation as well as frequent falls.  Patient is a resident of skilled nursing facility.  Patient describes in great detail that earlier this morning at approximately 2 or 3 AM her roommate's phone was ringing and therefore she got up to fetch it, unfortunately in the dark she lost her orientation and fell on the floor as she was turning.  She denies any vertigo, focal weakness, loss of consciousness or presyncope or seizure-like activity or tremors.  However the fall was not witnessed,.  Patient reports that she was helped by staff at the nursing home back into her bed.  Patient had right hip area pain since then.  Patient has chronic low back pain, reports no worsening pain there.  Does not report any other new site of pain such as neck or arms or legs.   Eventually patient was transferred to [facility], ER at approximately 5 PM this evening.  Patient is reporting continued pain of the right hip area and the entire thigh.  ER workup has revealed right femoral fracture, medical evaluation is sought  Past Medical History:  Diagnosis Date   AAA (abdominal aortic aneurysm) (HCC)    Anxiety disorder    Barrett's esophagus 04/2008   Benign neoplasm of colon    Cataract    removed both eyes   Chronic airway obstruction, not elsewhere classified    Chronic HFrEF (heart failure with reduced ejection fraction) (HCC)    Complication of anesthesia    slow to wake up   Depressive disorder, not elsewhere classified    Diverticulitis    Erosive gastritis 08/2004   GERD (gastroesophageal reflux disease)    HTN (hypertension)    pt denies    Hyperplastic polyps of stomach 06/2002   Hypothyroidism    Iliac aneurysm (HCC)    Incisional hernia    Insomnia 08/20/2015   Kyphosis 08/20/2015    LBBB (left bundle branch block)    Melanoma (HCC)    right arm   Mitral regurgitation    Other and unspecified hyperlipidemia    Pacemaker 04/26/2012   PAF (paroxysmal atrial fibrillation) (HCC)    11/2016   Restless legs syndrome (RLS)    Synovial cyst of popliteal space    Unspecified chronic bronchitis (HCC)     Past Surgical History:  Procedure Laterality Date   ABDOMINAL HYSTERECTOMY  1076   endometriosis   APPENDECTOMY     BI-VENTRICULAR PACEMAKER INSERTION N/A 04/26/2012   Procedure: BI-VENTRICULAR PACEMAKER INSERTION (CRT-P);  Surgeon: Marinus Maw, MD;  Location: [facility] CATH LAB;  Service: Cardiovascular;  Laterality: N/A;   BIV PACEMAKER GENERATOR CHANGEOUT N/A 01/26/2019   Procedure: BIV PACEMAKER GENERATOR CHANGEOUT;  Surgeon: Marinus Maw, MD;  Location: [facility] INVASIVE CV LAB;  Service: Cardiovascular;  Laterality: N/A;   CARDIOVERSION N/A 05/06/2020   Procedure: CARDIOVERSION;  Surgeon: Chrystie Nose, MD;  [facility] ENDOSCOPY;  Service: Cardiovascular;  Laterality: N/A;   CARDIOVERSION N/A 05/19/2020   Procedure: CARDIOVERSION;  Surgeon: Jake Bathe, MD;  Location: [facility] ENDOSCOPY;  Service: Cardiovascular;  Laterality: N/A;   COLONOSCOPY     defibrillator insertion     s/p removal of previously implanted BiV ICD and insertion of a new BiV pacemaker on 04/26/12   excise mole of lip  2001   Dr. Everlene Farrier   excision of wen  1984   Dr. Valrie Hart   FEMORAL HERNIA REPAIR  12/25/2012   Dr Derrell Lolling   FEMORAL HERNIA REPAIR  01/04/2013   Recurrent - Dr. Darin Engels HERNIA REPAIR N/A 10/01/2013   Procedure: LAPAROSCOPIC INCISIONAL HERNIA;  Surgeon: Atilano Ina, MD;  Location: [facility] ORS;  Service: General;  Laterality: N/A;   INCISIONAL HERNIA REPAIR N/A 09/07/2018   Procedure: LAPAROSCOPIC ASSISTED REPAIR OF INCISIONAL HERNIA;  Surgeon: Gaynelle Adu, MD;  Location: [facility] OR;  Service: General;  Laterality: N/A;   INGUINAL HERNIA REPAIR Right 09/26/2012   Procedure: RIGHT  INGUINAL HERNIA REPAIR WITH MESH;  Surgeon: Adolph Pollack, MD;  Location[facility] OR;  Service: General;  Laterality: Right;   INGUINAL HERNIA REPAIR Right 12/25/2012   Procedure: explor right groin, small bowel rescection, tissue repair right femoral hernia;  Surgeon: Ernestene Mention, MD;  Location: [facility] ORS;  Service: General;  Laterality: Right;   INSERTION OF MESH Right 09/26/2012   Procedure: INSERTION OF MESH;  Surgeon: Adolph Pollack, MD;  Location: [facility] OR;  Service: General;  Laterality: Right;   INSERTION OF MESH N/A 10/01/2013   Procedure: INSERTION OF MESH;  Surgeon: Atilano Ina, MD;  Location: [facility] ORS;  Service: General;  Laterality: N/A;   LAPAROSCOPY N/A 01/04/2013   Procedure: Diagnostic Laparoscopy, exploratory laparotomy with small bowel resection, closure of right femoral hernia repair;  Surgeon: Lodema Pilot, DO;  Location: [facility] ORS;  Service: General;  Laterality: N/A;   Left arm surgery     POLYPECTOMY     RIGHT BREAST LUMPECTOMY  1988   Dr. Francina Ames   right knee surgery     arthroscopy: Dr. Simonne Come   TONSILLECTOMY     UPPER GASTROINTESTINAL ENDOSCOPY      Family History  Problem Relation Age of Onset   Stroke Father    Heart disease Other        maternal side   Colon cancer Paternal Uncle    Breast cancer Maternal Aunt    Colon cancer Cousin    Diabetes Neg Hx    Colon polyps Neg Hx    Rectal cancer Neg Hx    Stomach cancer Neg Hx     Social History:  reports that she quit smoking about 28 years ago. Her smoking use included cigarettes. She started smoking about 43 years ago. She has a 15 pack-year smoking history. She has never used smokeless tobacco. She reports that she does not drink alcohol and does not use drugs.  Allergies:  Allergies  Allergen Reactions   Ciprofloxacin Other (See Comments)    Not indicated due to aneurysm in aorta     Hydrocodone Itching   Levofloxacin Other (See Comments)    Not indicated due to aneurysm in aorta     Moxifloxacin Other (See Comments)    Not indicated due to aneurysm in aorta    Cephalexin Itching, Swelling and Other (See Comments)    Edema, too   Doxycycline Other (See Comments)    Unknown reaction   Escitalopram Other (See Comments)    Dry mouth   Ofloxacin Other (See Comments)    "Allergic," per facility   Sertraline Other (See Comments)    Insomnia    Sulfamethoxazole Hives and Other (See Comments)    "Any sulfa meds"   Tape Other (See Comments)    Burns skin.    Latex Rash and Other (See Comments)    "  If pt. Makes contact with or wearing"   Neomycin Rash    Medications: I have reviewed the patient's current medications. Scheduled:  acetaminophen  650 mg Oral Q4H   calcium carbonate  1 tablet Oral BID WC   cholecalciferol  1,000 Units Oral Daily   vitamin B-12  1,000 mcg Oral Daily   digoxin  0.125 mg Oral Daily   feeding supplement  237 mL Oral BID BM   levothyroxine  100 mcg Oral Q0600   metoprolol succinate  12.5 mg Oral Daily   midodrine  5 mg Oral TID WC   multivitamin with minerals  1 tablet Oral Daily   ondansetron (ZOFRAN) IV  4 mg Intravenous Once   oxyCODONE  10 mg Oral Q12H   pantoprazole  20 mg Oral Daily   senna-docusate  2 tablet Oral BID   sodium chloride flush  3 mL Intravenous Q12H    Results for orders placed or performed during the hospital encounter of 10/28/22 (from the past 24 hour(s))  CBC with Differential     Status: Abnormal   Collection Time: 10/28/22  5:10 PM  Result Value Ref Range   WBC 10.8 (H) 4.0 - 10.5 K/uL   RBC 4.67 3.87 - 5.11 MIL/uL   Hemoglobin 13.1 12.0 - 15.0 g/dL   HCT 19.1 47.8 - 29.5 %   MCV 88.9 80.0 - 100.0 fL   MCH 28.1 26.0 - 34.0 pg   MCHC 31.6 30.0 - 36.0 g/dL   RDW 62.1 (H) 30.8 - 65.7 %   Platelets 243 150 - 400 K/uL   nRBC 0.0 0.0 - 0.2 %   Neutrophils Relative % 77 %   Neutro Abs 8.3 (H) 1.7 - 7.7 K/uL   Lymphocytes Relative 10 %   Lymphs Abs 1.1 0.7 - 4.0 K/uL   Monocytes Relative 9 %   Monocytes  Absolute 0.9 0.1 - 1.0 K/uL   Eosinophils Relative 3 %   Eosinophils Absolute 0.3 0.0 - 0.5 K/uL   Basophils Relative 0 %   Basophils Absolute 0.0 0.0 - 0.1 K/uL   Immature Granulocytes 1 %   Abs Immature Granulocytes 0.14 (H) 0.00 - 0.07 K/uL  Basic metabolic panel     Status: Abnormal   Collection Time: 10/28/22  5:10 PM  Result Value Ref Range   Sodium 137 135 - 145 mmol/L   Potassium 4.3 3.5 - 5.1 mmol/L   Chloride 110 98 - 111 mmol/L   CO2 21 (L) 22 - 32 mmol/L   Glucose, Bld 103 (H) 70 - 99 mg/dL   BUN 32 (H) 8 - 23 mg/dL   Creatinine, Ser 8.46 (H) 0.44 - 1.00 mg/dL   Calcium 8.7 (L) 8.9 - 10.3 mg/dL   GFR, Estimated 52 (L) >60 mL/min   Anion gap 6 5 - 15  Urinalysis, Routine w reflex microscopic -Urine, Clean Catch     Status: Abnormal   Collection Time: 10/28/22  7:28 PM  Result Value Ref Range   Color, Urine YELLOW YELLOW   APPearance CLOUDY (A) CLEAR   Specific Gravity, Urine 1.017 1.005 - 1.030   pH 5.0 5.0 - 8.0   Glucose, UA NEGATIVE NEGATIVE mg/dL   Hgb urine dipstick NEGATIVE NEGATIVE   Bilirubin Urine NEGATIVE NEGATIVE   Ketones, ur NEGATIVE NEGATIVE mg/dL   Protein, ur 30 (A) NEGATIVE mg/dL   Nitrite NEGATIVE NEGATIVE   Leukocytes,Ua LARGE (A) NEGATIVE   RBC / HPF 6-10 0 - 5 RBC/hpf   WBC,  UA >50 0 - 5 WBC/hpf   Bacteria, UA MANY (A) NONE SEEN   Squamous Epithelial / HPF 0-5 0 - 5 /HPF   WBC Clumps PRESENT   CBC     Status: Abnormal   Collection Time: 10/28/22  7:49 PM  Result Value Ref Range   WBC 13.4 (H) 4.0 - 10.5 K/uL   RBC 4.74 3.87 - 5.11 MIL/uL   Hemoglobin 13.5 12.0 - 15.0 g/dL   HCT 81.1 91.4 - 78.2 %   MCV 88.6 80.0 - 100.0 fL   MCH 28.5 26.0 - 34.0 pg   MCHC 32.1 30.0 - 36.0 g/dL   RDW 95.6 (H) 21.3 - 08.6 %   Platelets 228 150 - 400 K/uL   nRBC 0.0 0.0 - 0.2 %  Creatinine, serum     Status: None   Collection Time: 10/28/22  7:49 PM  Result Value Ref Range   Creatinine, Ser 0.86 0.44 - 1.00 mg/dL   GFR, Estimated >57 >84 mL/min   Basic metabolic panel     Status: Abnormal   Collection Time: 10/29/22  5:03 AM  Result Value Ref Range   Sodium 137 135 - 145 mmol/L   Potassium 3.9 3.5 - 5.1 mmol/L   Chloride 109 98 - 111 mmol/L   CO2 18 (L) 22 - 32 mmol/L   Glucose, Bld 96 70 - 99 mg/dL   BUN 28 (H) 8 - 23 mg/dL   Creatinine, Ser 6.96 0.44 - 1.00 mg/dL   Calcium 8.9 8.9 - 29.5 mg/dL   GFR, Estimated >28 >41 mL/min   Anion gap 10 5 - 15  CBC     Status: Abnormal   Collection Time: 10/29/22  5:03 AM  Result Value Ref Range   WBC 11.8 (H) 4.0 - 10.5 K/uL   RBC 4.35 3.87 - 5.11 MIL/uL   Hemoglobin 12.1 12.0 - 15.0 g/dL   HCT 32.4 40.1 - 02.7 %   MCV 89.2 80.0 - 100.0 fL   MCH 27.8 26.0 - 34.0 pg   MCHC 31.2 30.0 - 36.0 g/dL   RDW 25.3 (H) 66.4 - 40.3 %   Platelets 194 150 - 400 K/uL   nRBC 0.0 0.0 - 0.2 %  TSH     Status: Abnormal   Collection Time: 10/29/22  5:03 AM  Result Value Ref Range   TSH 5.053 (H) 0.350 - 4.500 uIU/mL  Hepatic function panel     Status: Abnormal   Collection Time: 10/29/22  5:03 AM  Result Value Ref Range   Total Protein 6.3 (L) 6.5 - 8.1 g/dL   Albumin 3.5 3.5 - 5.0 g/dL   AST 21 15 - 41 U/L   ALT 14 0 - 44 U/L   Alkaline Phosphatase 67 38 - 126 U/L   Total Bilirubin 0.9 0.3 - 1.2 mg/dL   Bilirubin, Direct 0.1 0.0 - 0.2 mg/dL   Indirect Bilirubin 0.8 0.3 - 0.9 mg/dL  Digoxin level     Status: None   Collection Time: 10/29/22  5:12 AM  Result Value Ref Range   Digoxin Level 1.4 0.8 - 2.0 ng/mL  Protime-INR     Status: Abnormal   Collection Time: 10/29/22  5:12 AM  Result Value Ref Range   Prothrombin Time 16.5 (H) 11.4 - 15.2 seconds   INR 1.3 (H) 0.8 - 1.2  APTT     Status: None   Collection Time: 10/29/22  5:12 AM  Result Value Ref Range   aPTT 29  24 - 36 seconds  Surgical PCR screen     Status: None   Collection Time: 10/29/22  2:54 PM   Specimen: Nasal Mucosa; Nasal Swab  Result Value Ref Range   MRSA, PCR NEGATIVE NEGATIVE   Staphylococcus aureus NEGATIVE  NEGATIVE     X-ray: EXAM: DG HIP (WITH OR WITHOUT PELVIS) 2-3V RIGHT   COMPARISON:  September 05, 2022 and April 07, 2021   FINDINGS: There is an acute fracture deformity extending through the inter trochanteric region of the proximal right femur. Chronic fracture deformities of the right superior and right inferior pubic rami are seen. A 16 mm x 16 mm well-defined cortical opacity is seen overlying the soft tissues adjacent to the lateral aspect of the left hip. This represents a new finding when compared to the April 07, 2021 study and appears to originate from the tip of the left greater trochanter. There is no evidence of dislocation. Degenerative changes seen involving both hips, in the form of joint space narrowing and acetabular sclerosis. Additional degenerative changes are seen within the visualized portion of the mid and lower lumbar spine.   IMPRESSION: 1. Acute fracture of the proximal right femur. 2. Chronic fracture deformities of the right superior and right inferior pubic rami. 3. Displaced fracture fragment of indeterminate age which likely originates from the tip of the left greater trochanter.     Electronically Signed   By: Aram Candela M.D.  ROS: As per HPI Recent falls  Blood pressure 121/88, pulse 93, temperature 98 F (36.7 C), temperature source Oral, resp. rate (!) 22, height 5\' 8"  (1.727 m), weight 54.4 kg, SpO2 100%.  Physical Exam: General: Thin appearing lady with guttering of forearms metacarpal guttering.  Pain is reasonably well-controlled with Tylenol.  Patient gives a coherent/detailed account of her history. Respiratory exam: Bilateral air entry vesicular, diminished Cardiovascular exam S1-S2 normal, irregular Abdomen all quadrants are soft nontender Pelvis no pelvic instability is noted based on lateral compression from the crests.  There is marked tenderness along the distal aspect of the right femur.  I did not manipulate the  right hip.  No clinical fracture of right leg or foot distal function is intact.  All other extremities seem to be well-functioning.  No focal tenderness of cervical spine, neck movements are free.  I could not get to the low back or the mid back given patient's pain in the right hip area. Slight shortening and external rotation of the right LE NVI  Assessment/Plan: Right intertrochanteric hip fracture  Plan: She will need operative intervention to stabilize her acute hip fracture NPO after MN for planned OR tomorrow am Consent will be ordered Plan will be reviewed in am including procedure and post operative course and expectaitons  Shelda Pal 10/29/2022, 4:38 PM

## 2022-10-29 NOTE — TOC Initial Note (Signed)
Transition of Care [facility]) - Initial/Assessment Note    Patient Details  Name: [name] MRN: [id number] Date of Birth: 1932/03/18  Transition of Care [facility]) CM/SW Contact:    Amada Jupiter, LCSW Phone Number: 10/29/2022, 3:00 PM  Clinical Narrative:                  Met with pt and her POA/HCPOA, Michaelle Birks, this afternoon to discuss discharge planning needs.  Pt is confused and only oriented to self.  She does look at Mr. Durwin Nora to answer questions.  He confirms that pt was living alone prior to hospitalization last month and then discharged on 7/5 to [facility] SNF for rehab.  Notes that her cognition appeared "OK" prior to falls and hitting her head (leading to last hospitalization).  Notes she has continued to have confusion since that time.  He adds that she has no family locally and no children.  He does understand pt's injury and the plans for surgery once medically cleared for this.  He notes that dc plan will be for her to return to [facility] SNF at dc and resume rehabilitation there.  I have confirmed with facility that they will plan to accept her back but TOC will need to submit for insurance authorization, again.  Per RN, pt may have planned surgery tomorrow.  TOC will follow along and await therapy evaluations after surgery to then begin process of SNF return.  Expected Discharge Plan: Skilled Nursing Facility Barriers to Discharge: Continued Medical Work up, English as a second language teacher   Patient Goals and CMS Choice Patient states their goals for this hospitalization and ongoing recovery are:: POA goal is return to [facility] SNF          Expected Discharge Plan and Services In-house Referral: Clinical Social Work     Living arrangements for the past 2 months: Single Family Home (prior to SNF admission 09/10/22)                 DME Arranged: N/A DME Agency: NA                  Prior Living Arrangements/Services Living arrangements for the past 2 months: Single  Family Home (prior to SNF admission 09/10/22) Lives with:: Self Patient language and need for interpreter reviewed:: Yes Do you feel safe going back to the place where you live?: Yes      Need for Family Participation in Patient Care: Yes (Comment) Care giver support system in place?: No (comment)   Criminal Activity/Legal Involvement Pertinent to Current Situation/Hospitalization: No - Comment as needed  Activities of Daily Living Home Assistive Devices/Equipment: Other (Comment) ADL Screening (condition at time of admission) Patient's cognitive ability adequate to safely complete daily activities?: Yes Is the patient deaf or have difficulty hearing?: No Does the patient have difficulty seeing, even when wearing glasses/contacts?: No Does the patient have difficulty concentrating, remembering, or making decisions?: Yes Patient able to express need for assistance with ADLs?: Yes Does the patient have difficulty dressing or bathing?: No Independently performs ADLs?: Yes (appropriate for developmental age) Does the patient have difficulty walking or climbing stairs?: Yes Weakness of Legs: Both Weakness of Arms/Hands: None  Permission Sought/Granted Permission sought to share information with : Other (comment)    Share Information with NAME: POA/ Erenest Blank @ [phone]           Emotional Assessment Appearance:: Appears stated age Attitude/Demeanor/Rapport: Unable to Assess Affect (typically observed): Unable to Assess Orientation: :  Oriented to Self Alcohol / Substance Use: Not Applicable Psych Involvement: No (comment)  Admission diagnosis:  Femoral fracture (HCC) [S72.90XA] Atrial fibrillation with rapid ventricular response (HCC) [I48.91] Fall in home, initial encounter [W19.XXXA, Y92.009] Closed fracture of proximal end of right femur, initial encounter (HCC) [S72.001A] Patient Active Problem List   Diagnosis Date Noted   Femoral fracture (HCC) 10/28/2022    Underweight 10/28/2022   Leukocytes in urine 10/28/2022   Protein-calorie malnutrition, severe 09/06/2022   UTI (urinary tract infection) 09/05/2022   Anxiety and depression 09/05/2022   Falls frequently 09/05/2022   Recurrent falls 09/05/2022   Fracture 04/09/2021   Fall at home, initial encounter 04/08/2021   Chronic systolic CHF (congestive heart failure) (HCC) 04/08/2021   Pubic ramus fracture (HCC) 04/07/2021   Moderate episode of recurrent major depressive disorder (HCC) 03/23/2021   Non-pressure chronic ulcer of left ankle with fat layer exposed (HCC) 01/01/2021   Persistent atrial fibrillation (HCC)    Acute on chronic congestive heart failure (HCC) 04/28/2020   Incisional hernia 09/07/2018   Coronary atherosclerosis due to calcified coronary lesion of native artery 12/09/2017   Anticoagulated 11/07/2015   Lumbago 08/20/2015   Kyphosis 08/20/2015   Insomnia 08/20/2015   Aortic atherosclerosis (HCC) 08/20/2015   Abdominal bruit 08/20/2015   Aortic ectasia (HCC) 08/20/2015   AAA (abdominal aortic aneurysm) without rupture (HCC) 08/20/2015   B12 deficiency 05/21/2015   HTN (hypertension) 11/19/2014   Cardiomyopathy, dilated, nonischemic (HCC) 07/18/2014   Biventricular cardiac pacemaker in situ 04/24/2014   Chronic cough 04/17/2014   Aneurysm of iliac artery (HCC) 04/17/2014   Incisional hernia, without obstruction or gangrene 11/21/2013   Frequency of urination 11/21/2013   Malaise and fatigue 05/08/2013   Raynaud disease 05/08/2013   Moderate malnutrition (HCC) 12/24/2012   Hypothyroidism    Hair thinning    Anxiety disorder    Hyperlipidemia    Sinusitis, chronic    Chronic bronchitis (HCC)    Depression    Restless legs syndrome (RLS)    PVC (premature ventricular contraction) 06/08/2011   GERD 02/03/2009   Acute on chronic systolic CHF (congestive heart failure) (HCC) 12/12/2008   BARRETT'S ESOPHAGUS 05/29/2008   Gastritis and gastroduodenitis 05/29/2008    PCP:  [name], MD Pharmacy:   [id number] [place], [place] - [address] [place] [address] Phone: [phone] Fax: [phone]     Social Determinants of Health (SDOH) Social History: SDOH Screenings   Food Insecurity: No Food Insecurity (10/29/2022)  Housing: Low Risk  (10/29/2022)  Transportation Needs: No Transportation Needs (10/29/2022)  Utilities: Not At Risk (10/29/2022)  Recent Concern: Utilities - At Risk (09/05/2022)  Alcohol Screen: Low Risk  (04/20/2018)  Depression (PHQ2-9): Low Risk  (12/09/2021)  Tobacco Use: Medium Risk (10/29/2022)   SDOH Interventions:     Readmission Risk Interventions    10/29/2022    2:39 PM  Readmission Risk Prevention Plan  Transportation Screening Complete  PCP or Specialist Appt within 5-7 Days Complete  Home Care Screening Complete  Medication Review (RN CM) Complete

## 2022-10-29 NOTE — Anesthesia Preprocedure Evaluation (Addendum)
Anesthesia Evaluation  Patient identified by MRN, date of birth, ID band Patient confused    Reviewed: Allergy & Precautions, NPO status , Patient's Chart, lab work & pertinent test results  History of Anesthesia Complications Negative for: history of anesthetic complications  Airway Mallampati: II  TM Distance: >3 FB Neck ROM: Full    Dental  (+) Dental Advisory Given   Pulmonary COPD, former smoker   breath sounds clear to auscultation + decreased breath sounds      Cardiovascular hypertension, Pt. on home beta blockers and Pt. on medications pulmonary hypertension+ CAD and +CHF  + dysrhythmias Atrial Fibrillation + pacemaker + Valvular Problems/Murmurs (Severe TR) MR  Rhythm:Irregular Rate:Normal + Systolic murmurs Echo 09/2022  1. Left ventricular ejection fraction, by estimation, is 25 to 30%. The left ventricle has severely decreased function. The left ventricle demonstrates global hypokinesis. Left ventricular diastolic function could not be evaluated.   2. Right ventricular systolic function is mildly reduced. The right ventricular size is normal. There is severely elevated pulmonary artery systolic pressure. The estimated right ventricular systolic pressure is 65.7 mmHg.   3. Left atrial size was moderately dilated.   4. Right atrial size was mildly dilated.   5. A small pericardial effusion is present. The pericardial effusion is circumferential.   6. The mitral valve is abnormal. Moderate mitral valve regurgitation.   7. The tricuspid valve is abnormal. Tricuspid valve regurgitation is severe.   8. The aortic valve is tricuspid. Aortic valve regurgitation is not visualized. Aortic valve sclerosis is present, with no evidence of aortic valve stenosis.   9. The pulmonic valve was abnormal.  10. The inferior vena cava is dilated in size with <50% respiratory variability, suggesting right atrial pressure of 15 mmHg.    Comparison(s): Changes from prior study are noted. 10/09/2020: LVEF 45-50%.    TTE 05/17/20: EF 25-30%, global hypokinesis, RV systolic function mildly reduced, mild RVE, moderately elevated PASP (48.9 mmHg), severe LAE/RAE, small pericardial effusion, moderate to severe MR, severe TR    Neuro/Psych  PSYCHIATRIC DISORDERS Anxiety Depression    negative neurological ROS     GI/Hepatic Neg liver ROS, PUD,GERD  ,,  Endo/Other  Hypothyroidism    Renal/GU Renal InsufficiencyRenal disease     Musculoskeletal  (+) Arthritis ,    Abdominal   Peds  Hematology Hgb 11.3   Anesthesia Other Findings Day of surgery medications reviewed with patient.  Reproductive/Obstetrics                             Anesthesia Physical Anesthesia Plan  ASA: 4  Anesthesia Plan: General   Post-op Pain Management: Ofirmev IV (intra-op)*   Induction: Intravenous  PONV Risk Score and Plan: 4 or greater and Treatment may vary due to age or medical condition, Ondansetron and Dexamethasone  Airway Management Planned: Oral ETT  Additional Equipment: Arterial line  Intra-op Plan:   Post-operative Plan: Possible Post-op intubation/ventilation  Informed Consent: I have reviewed the patients History and Physical, chart, labs and discussed the procedure including the risks, benefits and alternatives for the proposed anesthesia with the patient or authorized representative who has indicated his/her understanding and acceptance.   Patient has DNR.  Discussed DNR with power of attorney, Suspend DNR and Continue DNR.   Dental advisory given  Plan Discussed with: CRNA  Anesthesia Plan Comments: (I had a long and detailed discussion about the risks of the procedure with POA Michaelle Birks. I  specifically addressed the DNR in detail. Ultimately, he elected to allow a partial code: no chest compressions. I explained to him that were we to do chest compressions on Ms. Tella we would  almost certainly cause multiple rib fractures and it would only make her quality of life worse. He agreed that he did not wish her to have chest compressions. Medical code only.)        Anesthesia Quick Evaluation

## 2022-10-29 NOTE — Consult Note (Addendum)
Cardiology Consultation   Patient ID: [name] MRN: [id number]; DOB: 11/03/1932  Admit date: 10/28/2022 Date of Consult: 10/29/2022  PCP:  [name] Northern, MD   [facility] Providers Cardiologist:  Peter Swaziland, MD      Patient Profile:   [name] is a 89 y.o. female with a hx of chronic HFrEF s/p prior BiV-ICD downgraded to BiV-PPMM in 2020, LBBB, NSVT, PAF, moderate MR/TR, small AAA followed by vascular surgery, anxiety, depression, Barrett's esophagus, gastritis, GERD, HTN, HLD, frequent UTIs, history of pelvic fracture, hypothyroidism, CKD stage IIIa who is being seen 10/29/2022 for preoperative evaluation at the request of Dr. Allena Katz.  History of Present Illness:   [name] is an 89 year old female with above medical history. Patient is followed by Dr. Swaziland.  Per chart review, patient has a long cardiac history with a history of cardiomyopathy dating back several decades.  Patient had an EF of 20% in 2006 and underwent cardiac catheterization at that time that showed no coronary artery disease.  She went on to have a BiV ICD placed and later had normalization of EF.  Her device was later downgraded to a BiV PPM.  In 2018, patient developed paroxysmal atrial fibrillation and was started on Eliquis.  However, patient had itching on Eliquis and was later switched to Xarelto.  She continued to have itching on Xarelto and was later transitioned to Coumadin.  Later, she was able to tolerate Xarelto without developing a rash.  Overall, her atrial fibrillation was well-controlled and she spent most of her time in normal sinus rhythm.  In 05/2020, patient was admitted with recurrent heart failure and was found to be in A-fib with RVR.  Echocardiogram on 05/17/2020 showed EF had dropped to 25-30% with mildly reduced RV function, moderately elevated pulmonary artery systolic pressure, moderate to severe mitral regurgitation and severe tricuspid valve regurgitation.  Given patient's  advanced age, it was recommended that her atrial fibrillation and heart failure be managed medically with repeat echocardiogram in 3 months.  She did not undergo ischemic evaluation at that time.  Considered for Tikosyn, but patient did not want to start Tikosyn.  She underwent DCCV which was unsuccessful.  Started on amiodarone and had successful DCCV.  Follow-up echocardiogram in 10/2020 showed EF had improved to 45-50% with normal RV function, moderate mitral valve regurgitation, moderate tricuspid valve regurgitation. Later admitted in 03/2021 due to pelvic fracture after a mechanical fall. Her metoprolol was reduced by EP due to concerns of fatigue, hair falling out, gait instability. In 10/2021, patient stopped amiodarone due to side effects. Her last device check was completed in 04/2022 and showed afib burden <1%. She was pacing around 98% of the time   Patient was admitted from 6/29-09/10/22 with reports of increased falls over the last several months. Her falls were mechanical, and she did not have LOC. She had fallen and hit her head prior to ED presentation. Her anticoagulation was held in the setting of head injury and frequent falls.  Echocardiogram on 09/06/22 showed EF 25-30%, mildly reduced RV function, moderate mitral valve regurgitation, severe tricuspid valve regurgitation. Device interrogation that admission showed that patient had been in nearly persistent atrial fibrillation for the past 2-3 days in the setting of UTI. Her HR was difficult to control that admission, and she had hypotension requiring midodrine. She was discharged off on anticoagulation and on digoxin 0.0625 mg daily, metoprolol succinate 12.5 mg daily, and midodrine 10 mg TID.  Patient presented to the ED on 8/22 after she had a mechanical fall. Since her fall, she had been having pain in her right hip. Found to have a right intertrochanteric fracture.  Ortho was consulted, and patient is scheduled for surgery on Sunday.  Cardiology asked to evaluate for preop clearance.   On interview, patient is able to tell me her name and the year. She does not know the month and cannot tell me where she is. Remembers that she had a fall and has been having intermittent hip pain since then. Understands that she will likely be undergoing surgery this admission. Has not seen Dr. Swaziland for some time. Occasionally has some palpitations. Denies chest pain.   Past Medical History:  Diagnosis Date   AAA (abdominal aortic aneurysm) (HCC)    Allergy    all year    Anxiety disorder    Arthritis    Barrett's esophagus 04/2008   Benign neoplasm of colon    Blood transfusion without reported diagnosis    long ago per pt   Cataract    removed both eyes   Chronic airway obstruction, not elsewhere classified    Chronic HFrEF (heart failure with reduced ejection fraction) (HCC)    Complication of anesthesia    slow to wake up   Depressive disorder, not elsewhere classified    Diverticulitis    Dysthymic disorder    Erosive gastritis 08/2004   Frequent urinary tract infections    GERD (gastroesophageal reflux disease)    Hair thinning    HTN (hypertension)    pt denies    Hyperplastic polyps of stomach 06/2002   Hypothyroidism    Iliac aneurysm (HCC)    Incisional hernia    Insomnia 08/20/2015   Kyphosis 08/20/2015   LBBB (left bundle branch block)    LBBB (left bundle branch block)    Melanoma (HCC)    right arm   Mitral regurgitation    Nonischemic cardiomyopathy (HCC)    Osteopenia    Other and unspecified hyperlipidemia    Pacemaker 04/26/2012   PAF (paroxysmal atrial fibrillation) (HCC)    11/2016   Pain in joint, lower leg    Palpitations    Pneumonia    Restless legs syndrome (RLS)    Synovial cyst of popliteal space    Unspecified chronic bronchitis (HCC)    Unspecified nasal polyp    Unspecified sinusitis (chronic)     Past Surgical History:  Procedure Laterality Date   ABDOMINAL HYSTERECTOMY   1076   endometriosis   APPENDECTOMY     BI-VENTRICULAR PACEMAKER INSERTION N/A 04/26/2012   Procedure: BI-VENTRICULAR PACEMAKER INSERTION (CRT-P);  Surgeon: Marinus Maw, MD;  Location: [facility] CATH LAB;  Service: Cardiovascular;  Laterality: N/A;   BIV PACEMAKER GENERATOR CHANGEOUT N/A 01/26/2019   Procedure: BIV PACEMAKER GENERATOR CHANGEOUT;  Surgeon: Marinus Maw, MD;  Location: [facility] INVASIVE CV LAB;  Service: Cardiovascular;  Laterality: N/A;   CARDIOVERSION N/A 05/06/2020   Procedure: CARDIOVERSION;  Surgeon: Chrystie Nose, MD;  Location: [facility] ENDOSCOPY;  Service: Cardiovascular;  Laterality: N/A;   CARDIOVERSION N/A 05/19/2020   Procedure: CARDIOVERSION;  Surgeon: Jake Bathe, MD;  Location: [facility] ENDOSCOPY;  Service: Cardiovascular;  Laterality: N/A;   COLONOSCOPY     defibrillator insertion     s/p removal of previously implanted BiV ICD and insertion of a new BiV pacemaker on 04/26/12   excise mole of lip  2001   Dr. Everlene Farrier   excision of wen  1984   Dr. Valrie Hart   FEMORAL HERNIA REPAIR  12/25/2012   Dr Derrell Lolling   FEMORAL HERNIA REPAIR  01/04/2013   Recurrent - Dr. Darin Engels HERNIA REPAIR N/A 10/01/2013   Procedure: LAPAROSCOPIC INCISIONAL HERNIA;  Surgeon: Atilano Ina, MD;  Location: [facility] ORS;  Service: General;  Laterality: N/A;   INCISIONAL HERNIA REPAIR N/A 09/07/2018   Procedure: LAPAROSCOPIC ASSISTED REPAIR OF INCISIONAL HERNIA;  Surgeon: Gaynelle Adu, MD;  Location: [facility] OR;  Service: General;  Laterality: N/A;   INGUINAL HERNIA REPAIR Right 09/26/2012   Procedure: RIGHT INGUINAL HERNIA REPAIR WITH MESH;  Surgeon: Adolph Pollack, MD;  Location: [facility] OR;  Service: General;  Laterality: Right;   INGUINAL HERNIA REPAIR Right 12/25/2012   Procedure: explor right groin, small bowel rescection, tissue repair right femoral hernia;  Surgeon: Ernestene Mention, MD;  Location: [facility] ORS;  Service: General;  Laterality: Right;   INSERTION OF MESH Right 09/26/2012   Procedure: INSERTION  OF MESH;  Surgeon: Adolph Pollack, MD;  Location: [facility] OR;  Service: General;  Laterality: Right;   INSERTION OF MESH N/A 10/01/2013   Procedure: INSERTION OF MESH;  Surgeon: Atilano Ina, MD;  Location: [facility] ORS;  Service: General;  Laterality: N/A;   LAPAROSCOPY N/A 01/04/2013   Procedure: Diagnostic Laparoscopy, exploratory laparotomy with small bowel resection, closure of right femoral hernia repair;  Surgeon: Lodema Pilot, DO;  Location: [facility] ORS;  Service: General;  Laterality: N/A;   Left arm surgery     POLYPECTOMY     RIGHT BREAST LUMPECTOMY  1988   Dr. Francina Ames   right knee surgery     arthroscopy: Dr. Simonne Come   TONSILLECTOMY     UPPER GASTROINTESTINAL ENDOSCOPY       Home Medications:  Prior to Admission medications   Medication Sig Start Date End Date Taking? Authorizing Provider  acetaminophen (TYLENOL) 500 MG tablet Take 1,000 mg by mouth every 8 (eight) hours as needed (for pain).   Yes [provider]  Ascorbic Acid (VITAMIN C) 1000 MG tablet Take 1,000 mg by mouth at bedtime. Ester C   Yes [provider]  Calcium Carbonate-Vitamin D (CALCIUM + D PO) Take 2 tablets by mouth in the morning.   Yes [provider]  CETYLPYRIDINIUM CHLORIDE MT Use as directed 15 mLs in the mouth or throat daily as needed (as directed).   Yes [provider]  cyanocobalamin (VITAMIN B12) 1000 MCG tablet Take 1,000 mcg by mouth daily.   Yes [provider]  digoxin (DIGOX) 0.125 MG tablet Take 0.125 mg by mouth daily.   Yes [provider]  levothyroxine (SYNTHROID) 100 MCG tablet Take 1 tablet (100 mcg total) by mouth daily before breakfast. E03.9 09/13/22  Yes Frederica Kuster, MD  megestrol (MEGACE) 400 MG/10ML suspension Take 10 mLs (400 mg total) by mouth daily. 09/11/22  Yes Berton Mount I, MD  metoprolol succinate (TOPROL-XL) 25 MG 24 hr tablet Take 0.5 tablets (12.5 mg total) by mouth 2 (two) times daily. 09/10/22 11/09/22 Yes Berton Mount I, MD  midodrine (PROAMATINE) 5 MG tablet Take 5 mg by mouth 3 (three) times daily with meals.   Yes [provider]  oxyCODONE (OXY IR/ROXICODONE) 5 MG immediate release tablet Take 1 tablet (5 mg total) by mouth every 4 (four) hours as needed for moderate pain or severe pain. 09/10/22  Yes Barnetta Chapel, MD  Polyethyl Glycol-Propyl Glycol (SYSTANE) 0.4-0.3 % SOLN Place  1 drop into both eyes daily as needed (dry eyes).   Yes [provider]  RABEprazole (ACIPHEX) 20 MG tablet Take one tablet by mouth once daily for stomach Patient taking differently: Take 20 mg by mouth daily. 03/12/22  Yes Frederica Kuster, MD  Digoxin 62.5 MCG TABS Take 0.0625 mg by mouth daily. Patient not taking: Reported on 10/28/2022 09/11/22 11/10/22  Berton Mount I, MD  feeding supplement (ENSURE ENLIVE / ENSURE PLUS) LIQD Take 237 mLs by mouth 2 (two) times daily between meals. Patient not taking: Reported on 10/28/2022 09/11/22   Barnetta Chapel, MD    Inpatient Medications: Scheduled Meds:  acetaminophen  650 mg Oral Q4H   calcium carbonate  1 tablet Oral BID WC   cholecalciferol  1,000 Units Oral Daily   vitamin B-12  1,000 mcg Oral Daily   digoxin  0.125 mg Oral Daily   levothyroxine  100 mcg Oral Q0600   metoprolol succinate  12.5 mg Oral Daily   midodrine  5 mg Oral TID WC   ondansetron (ZOFRAN) IV  4 mg Intravenous Once   oxyCODONE  10 mg Oral Q12H   pantoprazole  20 mg Oral Daily   senna-docusate  2 tablet Oral BID   sodium chloride flush  3 mL Intravenous Q12H   Continuous Infusions:  PRN Meds: HYDROmorphone (DILAUDID) injection, naphazoline-glycerin, oxyCODONE, polyethylene glycol  Allergies:    Allergies  Allergen Reactions   Ciprofloxacin Other (See Comments)    Not indicated due to aneurysm in aorta     Hydrocodone Itching   Levofloxacin Other (See Comments)    Not indicated due to aneurysm in aorta    Moxifloxacin Other (See Comments)    Not indicated  due to aneurysm in aorta    Cephalexin Itching, Swelling and Other (See Comments)    Edema, too   Doxycycline Other (See Comments)    Unknown reaction   Escitalopram Other (See Comments)    Dry mouth   Ofloxacin Other (See Comments)    "Allergic," per facility   Sertraline Other (See Comments)    Insomnia    Sulfamethoxazole Hives and Other (See Comments)    "Any sulfa meds"   Tape Other (See Comments)    Burns skin.    Latex Rash and Other (See Comments)    "If pt. Makes contact with or wearing"   Neomycin Rash    Social History:   Social History   Socioeconomic History   Marital status: Single    Spouse name: Not on file   Number of children: 0   Years of education: Not on file   Highest education level: Not on file  Occupational History   Occupation: Retired    Associate Professor: RETIRED  Tobacco Use   Smoking status: Former    Current packs/day: 0.00    Average packs/day: 1 pack/day for 15.0 years (15.0 ttl pk-yrs)    Types: Cigarettes    Start date: 12/23/1978    Quit date: 12/22/1993    Years since quitting: 28.8   Smokeless tobacco: Never   Tobacco comments:    Does'nt reall year quit   Vaping Use   Vaping status: Never Used  Substance and Sexual Activity   Alcohol use: No    Alcohol/week: 0.0 standard drinks of alcohol   Drug use: No   Sexual activity: Never  Other Topics Concern   Not on file  Social History Narrative   Pt lives alone in a condo   Social  Determinants of Health   Financial Resource Strain: Not on file  Food Insecurity: No Food Insecurity (09/05/2022)   Hunger Vital Sign    Worried About Running Out of Food in the Last Year: Never true    Ran Out of Food in the Last Year: Never true  Transportation Needs: No Transportation Needs (09/05/2022)   PRAPARE - Administrator, Civil Service (Medical): No    Lack of Transportation (Non-Medical): No  Physical Activity: Not on file  Stress: Not on file  Social Connections: Not on file   Intimate Partner Violence: Not At Risk (09/05/2022)   Humiliation, Afraid, Rape, and Kick questionnaire    Fear of Current or Ex-Partner: No    Emotionally Abused: No    Physically Abused: No    Sexually Abused: No    Family History:    Family History  Problem Relation Age of Onset   Stroke Father    Heart disease Other        maternal side   Colon cancer Paternal Uncle    Breast cancer Maternal Aunt    Colon cancer Cousin    Diabetes Neg Hx    Colon polyps Neg Hx    Rectal cancer Neg Hx    Stomach cancer Neg Hx      ROS:  Please see the history of present illness.   All other ROS reviewed and negative.     Physical Exam/Data:   Vitals:   10/29/22 0445 10/29/22 0526 10/29/22 0700 10/29/22 0711  BP: 135/80  (!) 131/108 112/79  Pulse: (!) 105   (!) 113  Resp: 18  18 (!) 21  Temp:  98.7 F (37.1 C)    TempSrc:  Oral    SpO2: (!) 84%   99%  Weight:      Height:       No intake or output data in the 24 hours ending 10/29/22 0833    10/28/2022    5:02 PM 09/05/2022    5:16 AM 09/04/2022   11:36 PM  Last 3 Weights  Weight (lbs) 120 lb 127 lb 10.3 oz 125 lb  Weight (kg) 54.432 kg 57.9 kg 56.7 kg     Body mass index is 18.25 kg/m.  General:  Thin, frail elderly female. Laying flat in the bed. No acute distress  HEENT: normal Neck: no JVD Vascular: Radial pulses 2+ bilaterally Cardiac:  normal S1, S2; Irregular rate and rhythm, tachycardic  Lungs:  anterior lung exam clear. Normal work of breathing on [place]  Abd: soft, nontender, no hepatomegaly  Ext: no edema in BLE  Musculoskeletal:  No deformities, BUE and BLE strength normal and equal Skin: warm and dry  Neuro:  Alert to person, not place or time.  Psych:  Normal affect   EKG:  The EKG was personally reviewed and demonstrates:  Atrial fibrillation with RVR, LBBB present  Telemetry:  Telemetry was personally reviewed and demonstrates:  Patient not attached to telemetry at time of my evaluation   Relevant CV  Studies:  Echocardiogram 09/06/22  1. Left ventricular ejection fraction, by estimation, is 25 to 30%. The  left ventricle has severely decreased function. The left ventricle  demonstrates global hypokinesis. Left ventricular diastolic function could  not be evaluated.   2. Right ventricular systolic function is mildly reduced. The right  ventricular size is normal. There is severely elevated pulmonary artery  systolic pressure. The estimated right ventricular systolic pressure is  65.7 mmHg.   3.  Left atrial size was moderately dilated.   4. Right atrial size was mildly dilated.   5. A small pericardial effusion is present. The pericardial effusion is  circumferential.   6. The mitral valve is abnormal. Moderate mitral valve regurgitation.   7. The tricuspid valve is abnormal. Tricuspid valve regurgitation is  severe.   8. The aortic valve is tricuspid. Aortic valve regurgitation is not  visualized. Aortic valve sclerosis is present, with no evidence of aortic  valve stenosis.   9. The pulmonic valve was abnormal.  10. The inferior vena cava is dilated in size with <50% respiratory  variability, suggesting right atrial pressure of 15 mmHg.   Comparison(s): Changes from prior study are noted. 10/09/2020: LVEF 45-50%.   Laboratory Data:  High Sensitivity Troponin:  No results for input(s): "TROPONINIHS" in the last 720 hours.   Chemistry Recent Labs  Lab 10/28/22 1710 10/28/22 1949 10/29/22 0503  NA 137  --  137  K 4.3  --  3.9  CL 110  --  109  CO2 21*  --  18*  GLUCOSE 103*  --  96  BUN 32*  --  28*  CREATININE 1.04* 0.86 0.89  CALCIUM 8.7*  --  8.9  GFRNONAA 52* >60 >60  ANIONGAP 6  --  10    Recent Labs  Lab 10/29/22 0503  PROT 6.3*  ALBUMIN 3.5  AST 21  ALT 14  ALKPHOS 67  BILITOT 0.9   Lipids No results for input(s): "CHOL", "TRIG", "HDL", "LABVLDL", "LDLCALC", "CHOLHDL" in the last 168 hours.  Hematology Recent Labs  Lab 10/28/22 1710 10/28/22 1949  10/29/22 0503  WBC 10.8* 13.4* 11.8*  RBC 4.67 4.74 4.35  HGB 13.1 13.5 12.1  HCT 41.5 42.0 38.8  MCV 88.9 88.6 89.2  MCH 28.1 28.5 27.8  MCHC 31.6 32.1 31.2  RDW 16.3* 16.0* 16.3*  PLT 243 228 194   Thyroid  Recent Labs  Lab 10/29/22 0503  TSH 5.053*    BNPNo results for input(s): "BNP", "PROBNP" in the last 168 hours.  DDimer No results for input(s): "DDIMER" in the last 168 hours.   Radiology/Studies:  DG FEMUR PORT, MIN 2 VIEWS RIGHT  Result Date: 10/28/2022 CLINICAL DATA:  Additional imaging for right hip pain after fall EXAM: RIGHT FEMUR PORTABLE 2 VIEW COMPARISON:  Radiographs 10/28/2022 FINDINGS: Nondisplaced right intertrochanteric fracture. Remote fracture deformities about the right inferior superior pubic rami. No distal femur fracture. Right knee osteoarthritis with advanced lateral compartment narrowing. IMPRESSION: Redemonstrated nondisplaced right intertrochanteric fracture. Right knee osteoarthritis with advanced lateral compartment narrowing. Electronically Signed   By: Minerva Fester M.D.   On: 10/28/2022 20:34   DG Hip Unilat W or Wo Pelvis 2-3 Views Right  Result Date: 10/28/2022 CLINICAL DATA:  Status post fall. EXAM: DG HIP (WITH OR WITHOUT PELVIS) 2-3V RIGHT COMPARISON:  September 05, 2022 and April 07, 2021 FINDINGS: There is an acute fracture deformity extending through the inter trochanteric region of the proximal right femur. Chronic fracture deformities of the right superior and right inferior pubic rami are seen. A 16 mm x 16 mm well-defined cortical opacity is seen overlying the soft tissues adjacent to the lateral aspect of the left hip. This represents a new finding when compared to the April 07, 2021 study and appears to originate from the tip of the left greater trochanter. There is no evidence of dislocation. Degenerative changes seen involving both hips, in the form of joint space narrowing and acetabular sclerosis.  Additional degenerative changes are  seen within the visualized portion of the mid and lower lumbar spine. IMPRESSION: 1. Acute fracture of the proximal right femur. 2. Chronic fracture deformities of the right superior and right inferior pubic rami. 3. Displaced fracture fragment of indeterminate age which likely originates from the tip of the left greater trochanter. Electronically Signed   By: Aram Candela M.D.   On: 10/28/2022 18:44   DG Chest 2 View  Result Date: 10/28/2022 CLINICAL DATA:  Status post trip and fall. EXAM: CHEST - 2 VIEW COMPARISON:  September 04, 2022 FINDINGS: There is stable multi lead AICD positioning. The heart size and mediastinal contours are within normal limits. Marked severity calcification and tortuosity thoracic aorta is noted. There is no evidence of an acute infiltrate, pleural effusion or pneumothorax. Radiopaque surgical clips are seen overlying the left axilla. The visualized skeletal structures are unremarkable. IMPRESSION: No active cardiopulmonary disease. Electronically Signed   By: Aram Candela M.D.   On: 10/28/2022 18:37   CT Head Wo Contrast  Result Date: 10/28/2022 CLINICAL DATA:  Fall, hit head on Xarelto EXAM: CT HEAD WITHOUT CONTRAST TECHNIQUE: Contiguous axial images were obtained from the base of the skull through the vertex without intravenous contrast. RADIATION DOSE REDUCTION: This exam was performed according to the departmental dose-optimization program which includes automated exposure control, adjustment of the mA and/or kV according to patient size and/or use of iterative reconstruction technique. COMPARISON:  CT brain 09/05/2022, 04/07/2021 FINDINGS: Brain: No acute territorial infarction, hemorrhage or intracranial mass. Atrophy. Mild chronic small vessel ischemic changes of the white matter. Mildly prominent ventricles but stable in size Vascular: No hyperdense vessels. Vertebral and carotid vascular calcification Skull: Normal. Negative for fracture or focal lesion.  Sinuses/Orbits: No acute finding. Other: None IMPRESSION: No CT evidence for acute intracranial abnormality. Atrophy and chronic small vessel ischemic changes of the white matter. Electronically Signed   By: Jasmine Pang M.D.   On: 10/28/2022 18:26     Assessment and Plan:   Preoperative Risk Evaluation  - Patient is an 89 year old female with a past medical history of chronic HFrEF (EF 25-30%), mildly reduced RV function, paroxysmal atrial fibrillation, frequent falls, moderate MR and TR, CKD stage IIIa. She also has confusion, BMI 18.25. On my interview, patient reports that she primarily gets around using a wheelchair at her facility  - EKG this admission showed afib with RVR, HR 126 BPM, LBBB - Patient is a bit confused/disoriented on my interview- unable to tell me where she is, what month it is. However, she does admit to having some chest pain recently. Unable to further explain or describe her symptoms  - Overall, patient is a high risk surgical candidate. However, she is not a prohibitive risk for surgical repair of hip fracture  - I do not anticipate the need for any further cardiac testing as it will not affect treatment plan   Paroxysmal Atrial Fibrillation  - In the past, patient had side effects with amiodarone and is adamant that she does not want to take it again  - Currently, patient is in afib with RVR, HR elevated to the  100s-120s - For now, resume home digoxin 0.125 and metoprolol succinate 12.5 mg daily  - CHADS-VASc 6. Xarelto currently held for surgery- of note, patient has had multiple falls recently, some of which involved head injuries. I do not think that she is a good candidate for long term AC   Chronic HFrEF  Moderate  Mitral Valve Regurgitation  Severe Tricuspid Valve regurgitation  - Echocardiogram from 09/2022 showed EF 25-30%, mildly reduced RV function, severely elevated pulmonary artery systolic pressure, moderate MR, severe TR  - Patient overall euvolemic on  exam. Oxygenating well. OK to hold off on diuresis for now   PPM in place - Previously had ICD, but device was downgraded to PPM  - Last device interrogation from 04/2022 showed normal device function   Hypotension  - On midodrine 5 mg TID   Otherwise per primary  - Underweight  - Fall at home, right femoral fracture - Lueukocytes in urine   Risk Assessment/Risk Scores:    New York Heart Association (NYHA) Functional Class NYHA Class III  CHA2DS2-VASc Score = 6   This indicates a 9.7% annual risk of stroke. The patient's score is based upon: CHF History: 1 HTN History: 1 Diabetes History: 0 Stroke History: 0 Vascular Disease History: 1 (aortic atherosclerosis) Age Score: 2 Gender Score: 1     For questions or updates, please contact [facility] Please consult [email or web address] for contact info under    Signed, Jonita Albee, PA-C  10/29/2022 8:33 AM  History and all data above reviewed.  Patient examined.  I agree with the findings as above.  Unfortunate patient with significant confusion.  She is not able to give any history.  She apparently had a fall with hip fracture.  She has an extensive cardiac history as above.   She does not report SOB or pain until I start to exam her and she has pain with movement and palpation on the right let particularly for me at the knee.  She doesn't report chest pain or SOB and doesn't feel hear heart racing.   The patient exam reveals [id number]   ,  Lungs: Clear anteriorly  ,  Abd: Positive bowel sounds, no rebound no guarding, Ext No edema  .  All available labs, radiology testing, previous records reviewed. Agree with documented assessment and plan.   Preop:  High risk because of her multiple comorbid conditions.  Risks include heart failure, tachyarrhythmias and less likely acute coronary events.  Risks needs to be weighed against the benefit for pain management and mobility.  There are no preoperative tests or procedures  that would reduce that risk.  Atrial fib:  I would suggest that we not resume anticoagulation at discharge.   She is too frail to consider Watchman.  Rate control only.  This is precluded by hypotension.  She will have rapid atrial rates with her fib and we will control as needed and able with IV or therapy.  Preop her rate is reasonable at about 100 and not sustaining rapid rate.  Chronic systolic HF:  Watch IV fluid closely and follow strict intake and output.   Fayrene Fearing Margia Wiesen  11:10 AM  10/29/2022

## 2022-10-29 NOTE — Progress Notes (Signed)
Case discussed with the emergency department overnight.  She does need a right nail to treat intertrochanteric fracture.  Unfortunately, anticoagulated at this time.  Will need to hold anticoagulation.  Potential surgery for Sunday.  Full consultation note to come.

## 2022-10-29 NOTE — Progress Notes (Signed)
Initial Nutrition Assessment  DOCUMENTATION CODES:   Severe malnutrition in context of chronic illness, Underweight  INTERVENTION:   -Ensure Plus High Protein po BID, each supplement provides 350 kcal and 20 grams of protein.   -Magic cup TID with meals, each supplement provides 290 kcal and 9 grams of protein   -Multivitamin with minerals daily  NUTRITION DIAGNOSIS:   Severe Malnutrition related to chronic illness, lethargy/confusion as evidenced by severe fat depletion, severe muscle depletion.  GOAL:   Patient will meet greater than or equal to 90% of their needs  MONITOR:   PO intake, Supplement acceptance, Labs, Weight trends, I & O's  REASON FOR ASSESSMENT:   Consult Assessment of nutrition requirement/status  ASSESSMENT:   89 y.o. female with cardiomyopathy status post BiV/ICD, hypertension, atrial fibrillation, hypothyroidism, melanoma, anxiety and depression  who presents via [facility], patient tripped trying to help someone. Patient does not remember if she hit her head, endorses taking Xarelto. Patient A&Ox4, complaining of hip pain to R hip.  Patient in room, no family at bedside. During visit, a friend of the family called on the room phone to say he was going to visit patient today. Pt a bit confused during visit. Was not a good historian. States she has been falling however and that she does drink Ensure at home. Will resume protein shakes, Magic cups and daily MVI to aid in healing.  Per chart review, pt may have surgery this weekend.   Per weight records, pt has lost 7 lbs since 6/30 (5% wt loss x 2 months, insignificant for time frame).  Medications: OSCAL, Vitamin D, Vitamin B-12, Zofran, Senokot  Labs reviewed.   NUTRITION - FOCUSED PHYSICAL EXAM:  Flowsheet Row Most Recent Value  Orbital Region Severe depletion  Upper Arm Region Severe depletion  Thoracic and Lumbar Region Severe depletion  Buccal Region Moderate depletion  Temple  Region Severe depletion  Clavicle Bone Region Severe depletion  Clavicle and Acromion Bone Region Severe depletion  Scapular Bone Region Severe depletion  Dorsal Hand Severe depletion  Patellar Region Severe depletion  Anterior Thigh Region Severe depletion  Posterior Calf Region Severe depletion  Edema (RD Assessment) None  Hair Reviewed  Eyes Reviewed  Mouth Reviewed  Skin Reviewed  Nails Reviewed       Diet Order:   Diet Order             Diet Heart Room service appropriate? Yes; Fluid consistency: Thin  Diet effective now                   EDUCATION NEEDS:   Not appropriate for education at this time  Skin:  Skin Assessment: Reviewed RN Assessment  Last BM:  8/23 -type 6  Height:   Ht Readings from Last 1 Encounters:  10/28/22 5\' 8"  (1.727 m)    Weight:   Wt Readings from Last 1 Encounters:  10/28/22 54.4 kg    BMI:  Body mass index is 18.25 kg/m.  Estimated Nutritional Needs:   Kcal:  1800-2000  Protein:  75-90g  Fluid:  1.8L/day   Tilda Franco, MS, RD, LDN Inpatient Clinical Dietitian Contact information available via Amion

## 2022-10-29 NOTE — Consult Note (Signed)
Reason for Consult: right hip fracture Referring Physician: Allena Katz, MD  [name] is an 89 y.o. female.  HPI:  [name] is a 89 y.o. female with medical history significant of prior known atrial fibrillation as well as frequent falls.  Patient is a resident of skilled nursing facility.  Patient describes in great detail that earlier this morning at approximately 2 or 3 AM her roommate's phone was ringing and therefore she got up to fetch it, unfortunately in the dark she lost her orientation and fell on the floor as she was turning.  She denies any vertigo, focal weakness, loss of consciousness or presyncope or seizure-like activity or tremors.  However the fall was not witnessed,.  Patient reports that she was helped by staff at the nursing home back into her bed.  Patient had right hip area pain since then.  Patient has chronic low back pain, reports no worsening pain there.  Does not report any other new site of pain such as neck or arms or legs.   Eventually patient was transferred to [facility], ER at approximately 5 PM this evening.  Patient is reporting continued pain of the right hip area and the entire thigh.  ER workup has revealed right femoral fracture, medical evaluation is sought  Past Medical History:  Diagnosis Date   AAA (abdominal aortic aneurysm) (HCC)    Anxiety disorder    Barrett's esophagus 04/2008   Benign neoplasm of colon    Cataract    removed both eyes   Chronic airway obstruction, not elsewhere classified    Chronic HFrEF (heart failure with reduced ejection fraction) (HCC)    Complication of anesthesia    slow to wake up   Depressive disorder, not elsewhere classified    Diverticulitis    Erosive gastritis 08/2004   GERD (gastroesophageal reflux disease)    HTN (hypertension)    pt denies    Hyperplastic polyps of stomach 06/2002   Hypothyroidism    Iliac aneurysm (HCC)    Incisional hernia    Insomnia 08/20/2015   Kyphosis 08/20/2015    LBBB (left bundle branch block)    Melanoma (HCC)    right arm   Mitral regurgitation    Other and unspecified hyperlipidemia    Pacemaker 04/26/2012   PAF (paroxysmal atrial fibrillation) (HCC)    11/2016   Restless legs syndrome (RLS)    Synovial cyst of popliteal space    Unspecified chronic bronchitis (HCC)     Past Surgical History:  Procedure Laterality Date   ABDOMINAL HYSTERECTOMY  1076   endometriosis   APPENDECTOMY     BI-VENTRICULAR PACEMAKER INSERTION N/A 04/26/2012   Procedure: BI-VENTRICULAR PACEMAKER INSERTION (CRT-P);  Surgeon: Marinus Maw, MD;  Location: [facility] CATH LAB;  Service: Cardiovascular;  Laterality: N/A;   BIV PACEMAKER GENERATOR CHANGEOUT N/A 01/26/2019   Procedure: BIV PACEMAKER GENERATOR CHANGEOUT;  Surgeon: Marinus Maw, MD;  Location: [facility] INVASIVE CV LAB;  Service: Cardiovascular;  Laterality: N/A;   CARDIOVERSION N/A 05/06/2020   Procedure: CARDIOVERSION;  Surgeon: Chrystie Nose, MD;  [facility] ENDOSCOPY;  Service: Cardiovascular;  Laterality: N/A;   CARDIOVERSION N/A 05/19/2020   Procedure: CARDIOVERSION;  Surgeon: Jake Bathe, MD;  Location: [facility] ENDOSCOPY;  Service: Cardiovascular;  Laterality: N/A;   COLONOSCOPY     defibrillator insertion     s/p removal of previously implanted BiV ICD and insertion of a new BiV pacemaker on 04/26/12   excise mole of lip  2001   Dr. Everlene Farrier   excision of wen  1984   Dr. Valrie Hart   FEMORAL HERNIA REPAIR  12/25/2012   Dr Derrell Lolling   FEMORAL HERNIA REPAIR  01/04/2013   Recurrent - Dr. Darin Engels HERNIA REPAIR N/A 10/01/2013   Procedure: LAPAROSCOPIC INCISIONAL HERNIA;  Surgeon: Atilano Ina, MD;  Location: [facility] ORS;  Service: General;  Laterality: N/A;   INCISIONAL HERNIA REPAIR N/A 09/07/2018   Procedure: LAPAROSCOPIC ASSISTED REPAIR OF INCISIONAL HERNIA;  Surgeon: Gaynelle Adu, MD;  Location: [facility] OR;  Service: General;  Laterality: N/A;   INGUINAL HERNIA REPAIR Right 09/26/2012   Procedure: RIGHT  INGUINAL HERNIA REPAIR WITH MESH;  Surgeon: Adolph Pollack, MD;  Location[facility] OR;  Service: General;  Laterality: Right;   INGUINAL HERNIA REPAIR Right 12/25/2012   Procedure: explor right groin, small bowel rescection, tissue repair right femoral hernia;  Surgeon: Ernestene Mention, MD;  Location: [facility] ORS;  Service: General;  Laterality: Right;   INSERTION OF MESH Right 09/26/2012   Procedure: INSERTION OF MESH;  Surgeon: Adolph Pollack, MD;  Location: [facility] OR;  Service: General;  Laterality: Right;   INSERTION OF MESH N/A 10/01/2013   Procedure: INSERTION OF MESH;  Surgeon: Atilano Ina, MD;  Location: [facility] ORS;  Service: General;  Laterality: N/A;   LAPAROSCOPY N/A 01/04/2013   Procedure: Diagnostic Laparoscopy, exploratory laparotomy with small bowel resection, closure of right femoral hernia repair;  Surgeon: Lodema Pilot, DO;  Location: [facility] ORS;  Service: General;  Laterality: N/A;   Left arm surgery     POLYPECTOMY     RIGHT BREAST LUMPECTOMY  1988   Dr. Francina Ames   right knee surgery     arthroscopy: Dr. Simonne Come   TONSILLECTOMY     UPPER GASTROINTESTINAL ENDOSCOPY      Family History  Problem Relation Age of Onset   Stroke Father    Heart disease Other        maternal side   Colon cancer Paternal Uncle    Breast cancer Maternal Aunt    Colon cancer Cousin    Diabetes Neg Hx    Colon polyps Neg Hx    Rectal cancer Neg Hx    Stomach cancer Neg Hx     Social History:  reports that she quit smoking about 28 years ago. Her smoking use included cigarettes. She started smoking about 43 years ago. She has a 15 pack-year smoking history. She has never used smokeless tobacco. She reports that she does not drink alcohol and does not use drugs.  Allergies:  Allergies  Allergen Reactions   Ciprofloxacin Other (See Comments)    Not indicated due to aneurysm in aorta     Hydrocodone Itching   Levofloxacin Other (See Comments)    Not indicated due to aneurysm in aorta     Moxifloxacin Other (See Comments)    Not indicated due to aneurysm in aorta    Cephalexin Itching, Swelling and Other (See Comments)    Edema, too   Doxycycline Other (See Comments)    Unknown reaction   Escitalopram Other (See Comments)    Dry mouth   Ofloxacin Other (See Comments)    "Allergic," per facility   Sertraline Other (See Comments)    Insomnia    Sulfamethoxazole Hives and Other (See Comments)    "Any sulfa meds"   Tape Other (See Comments)    Burns skin.    Latex Rash and Other (See Comments)    "  If pt. Makes contact with or wearing"   Neomycin Rash    Medications: I have reviewed the patient's current medications. Scheduled:  acetaminophen  650 mg Oral Q4H   calcium carbonate  1 tablet Oral BID WC   cholecalciferol  1,000 Units Oral Daily   vitamin B-12  1,000 mcg Oral Daily   digoxin  0.125 mg Oral Daily   feeding supplement  237 mL Oral BID BM   levothyroxine  100 mcg Oral Q0600   metoprolol succinate  12.5 mg Oral Daily   midodrine  5 mg Oral TID WC   multivitamin with minerals  1 tablet Oral Daily   ondansetron (ZOFRAN) IV  4 mg Intravenous Once   oxyCODONE  10 mg Oral Q12H   pantoprazole  20 mg Oral Daily   senna-docusate  2 tablet Oral BID   sodium chloride flush  3 mL Intravenous Q12H    Results for orders placed or performed during the hospital encounter of 10/28/22 (from the past 24 hour(s))  CBC with Differential     Status: Abnormal   Collection Time: 10/28/22  5:10 PM  Result Value Ref Range   WBC 10.8 (H) 4.0 - 10.5 K/uL   RBC 4.67 3.87 - 5.11 MIL/uL   Hemoglobin 13.1 12.0 - 15.0 g/dL   HCT 19.1 47.8 - 29.5 %   MCV 88.9 80.0 - 100.0 fL   MCH 28.1 26.0 - 34.0 pg   MCHC 31.6 30.0 - 36.0 g/dL   RDW 62.1 (H) 30.8 - 65.7 %   Platelets 243 150 - 400 K/uL   nRBC 0.0 0.0 - 0.2 %   Neutrophils Relative % 77 %   Neutro Abs 8.3 (H) 1.7 - 7.7 K/uL   Lymphocytes Relative 10 %   Lymphs Abs 1.1 0.7 - 4.0 K/uL   Monocytes Relative 9 %   Monocytes  Absolute 0.9 0.1 - 1.0 K/uL   Eosinophils Relative 3 %   Eosinophils Absolute 0.3 0.0 - 0.5 K/uL   Basophils Relative 0 %   Basophils Absolute 0.0 0.0 - 0.1 K/uL   Immature Granulocytes 1 %   Abs Immature Granulocytes 0.14 (H) 0.00 - 0.07 K/uL  Basic metabolic panel     Status: Abnormal   Collection Time: 10/28/22  5:10 PM  Result Value Ref Range   Sodium 137 135 - 145 mmol/L   Potassium 4.3 3.5 - 5.1 mmol/L   Chloride 110 98 - 111 mmol/L   CO2 21 (L) 22 - 32 mmol/L   Glucose, Bld 103 (H) 70 - 99 mg/dL   BUN 32 (H) 8 - 23 mg/dL   Creatinine, Ser 8.46 (H) 0.44 - 1.00 mg/dL   Calcium 8.7 (L) 8.9 - 10.3 mg/dL   GFR, Estimated 52 (L) >60 mL/min   Anion gap 6 5 - 15  Urinalysis, Routine w reflex microscopic -Urine, Clean Catch     Status: Abnormal   Collection Time: 10/28/22  7:28 PM  Result Value Ref Range   Color, Urine YELLOW YELLOW   APPearance CLOUDY (A) CLEAR   Specific Gravity, Urine 1.017 1.005 - 1.030   pH 5.0 5.0 - 8.0   Glucose, UA NEGATIVE NEGATIVE mg/dL   Hgb urine dipstick NEGATIVE NEGATIVE   Bilirubin Urine NEGATIVE NEGATIVE   Ketones, ur NEGATIVE NEGATIVE mg/dL   Protein, ur 30 (A) NEGATIVE mg/dL   Nitrite NEGATIVE NEGATIVE   Leukocytes,Ua LARGE (A) NEGATIVE   RBC / HPF 6-10 0 - 5 RBC/hpf   WBC,  UA >50 0 - 5 WBC/hpf   Bacteria, UA MANY (A) NONE SEEN   Squamous Epithelial / HPF 0-5 0 - 5 /HPF   WBC Clumps PRESENT   CBC     Status: Abnormal   Collection Time: 10/28/22  7:49 PM  Result Value Ref Range   WBC 13.4 (H) 4.0 - 10.5 K/uL   RBC 4.74 3.87 - 5.11 MIL/uL   Hemoglobin 13.5 12.0 - 15.0 g/dL   HCT 81.1 91.4 - 78.2 %   MCV 88.6 80.0 - 100.0 fL   MCH 28.5 26.0 - 34.0 pg   MCHC 32.1 30.0 - 36.0 g/dL   RDW 95.6 (H) 21.3 - 08.6 %   Platelets 228 150 - 400 K/uL   nRBC 0.0 0.0 - 0.2 %  Creatinine, serum     Status: None   Collection Time: 10/28/22  7:49 PM  Result Value Ref Range   Creatinine, Ser 0.86 0.44 - 1.00 mg/dL   GFR, Estimated >57 >84 mL/min   Basic metabolic panel     Status: Abnormal   Collection Time: 10/29/22  5:03 AM  Result Value Ref Range   Sodium 137 135 - 145 mmol/L   Potassium 3.9 3.5 - 5.1 mmol/L   Chloride 109 98 - 111 mmol/L   CO2 18 (L) 22 - 32 mmol/L   Glucose, Bld 96 70 - 99 mg/dL   BUN 28 (H) 8 - 23 mg/dL   Creatinine, Ser 6.96 0.44 - 1.00 mg/dL   Calcium 8.9 8.9 - 29.5 mg/dL   GFR, Estimated >28 >41 mL/min   Anion gap 10 5 - 15  CBC     Status: Abnormal   Collection Time: 10/29/22  5:03 AM  Result Value Ref Range   WBC 11.8 (H) 4.0 - 10.5 K/uL   RBC 4.35 3.87 - 5.11 MIL/uL   Hemoglobin 12.1 12.0 - 15.0 g/dL   HCT 32.4 40.1 - 02.7 %   MCV 89.2 80.0 - 100.0 fL   MCH 27.8 26.0 - 34.0 pg   MCHC 31.2 30.0 - 36.0 g/dL   RDW 25.3 (H) 66.4 - 40.3 %   Platelets 194 150 - 400 K/uL   nRBC 0.0 0.0 - 0.2 %  TSH     Status: Abnormal   Collection Time: 10/29/22  5:03 AM  Result Value Ref Range   TSH 5.053 (H) 0.350 - 4.500 uIU/mL  Hepatic function panel     Status: Abnormal   Collection Time: 10/29/22  5:03 AM  Result Value Ref Range   Total Protein 6.3 (L) 6.5 - 8.1 g/dL   Albumin 3.5 3.5 - 5.0 g/dL   AST 21 15 - 41 U/L   ALT 14 0 - 44 U/L   Alkaline Phosphatase 67 38 - 126 U/L   Total Bilirubin 0.9 0.3 - 1.2 mg/dL   Bilirubin, Direct 0.1 0.0 - 0.2 mg/dL   Indirect Bilirubin 0.8 0.3 - 0.9 mg/dL  Digoxin level     Status: None   Collection Time: 10/29/22  5:12 AM  Result Value Ref Range   Digoxin Level 1.4 0.8 - 2.0 ng/mL  Protime-INR     Status: Abnormal   Collection Time: 10/29/22  5:12 AM  Result Value Ref Range   Prothrombin Time 16.5 (H) 11.4 - 15.2 seconds   INR 1.3 (H) 0.8 - 1.2  APTT     Status: None   Collection Time: 10/29/22  5:12 AM  Result Value Ref Range   aPTT 29  24 - 36 seconds  Surgical PCR screen     Status: None   Collection Time: 10/29/22  2:54 PM   Specimen: Nasal Mucosa; Nasal Swab  Result Value Ref Range   MRSA, PCR NEGATIVE NEGATIVE   Staphylococcus aureus NEGATIVE  NEGATIVE     X-ray: EXAM: DG HIP (WITH OR WITHOUT PELVIS) 2-3V RIGHT   COMPARISON:  September 05, 2022 and April 07, 2021   FINDINGS: There is an acute fracture deformity extending through the inter trochanteric region of the proximal right femur. Chronic fracture deformities of the right superior and right inferior pubic rami are seen. A 16 mm x 16 mm well-defined cortical opacity is seen overlying the soft tissues adjacent to the lateral aspect of the left hip. This represents a new finding when compared to the April 07, 2021 study and appears to originate from the tip of the left greater trochanter. There is no evidence of dislocation. Degenerative changes seen involving both hips, in the form of joint space narrowing and acetabular sclerosis. Additional degenerative changes are seen within the visualized portion of the mid and lower lumbar spine.   IMPRESSION: 1. Acute fracture of the proximal right femur. 2. Chronic fracture deformities of the right superior and right inferior pubic rami. 3. Displaced fracture fragment of indeterminate age which likely originates from the tip of the left greater trochanter.     Electronically Signed   By: Aram Candela M.D.  ROS: As per HPI Recent falls  Blood pressure 121/88, pulse 93, temperature 98 F (36.7 C), temperature source Oral, resp. rate (!) 22, height 5\' 8"  (1.727 m), weight 54.4 kg, SpO2 100%.  Physical Exam: General: Thin appearing lady with guttering of forearms metacarpal guttering.  Pain is reasonably well-controlled with Tylenol.  Patient gives a coherent/detailed account of her history. Respiratory exam: Bilateral air entry vesicular, diminished Cardiovascular exam S1-S2 normal, irregular Abdomen all quadrants are soft nontender Pelvis no pelvic instability is noted based on lateral compression from the crests.  There is marked tenderness along the distal aspect of the right femur.  I did not manipulate the  right hip.  No clinical fracture of right leg or foot distal function is intact.  All other extremities seem to be well-functioning.  No focal tenderness of cervical spine, neck movements are free.  I could not get to the low back or the mid back given patient's pain in the right hip area. Slight shortening and external rotation of the right LE NVI  Assessment/Plan: Right intertrochanteric hip fracture  Plan: She will need operative intervention to stabilize her acute hip fracture NPO after MN for planned OR tomorrow am Consent will be ordered Plan will be reviewed in am including procedure and post operative course and expectaitons  Shelda Pal 10/29/2022, 4:38 PM

## 2022-10-30 ENCOUNTER — Other Ambulatory Visit: Payer: Self-pay

## 2022-10-30 ENCOUNTER — Inpatient Hospital Stay (HOSPITAL_COMMUNITY): Payer: Medicare Other | Admitting: Anesthesiology

## 2022-10-30 ENCOUNTER — Inpatient Hospital Stay (HOSPITAL_COMMUNITY): Payer: Medicare Other

## 2022-10-30 ENCOUNTER — Encounter (HOSPITAL_COMMUNITY)
Admission: EM | Disposition: A | Discharge status: Skilled Nursing Facility | Payer: Self-pay | Source: Skilled Nursing Facility | Attending: Internal Medicine

## 2022-10-30 DIAGNOSIS — I2510 Atherosclerotic heart disease of native coronary artery without angina pectoris: Secondary | ICD-10-CM | POA: Diagnosis not present

## 2022-10-30 DIAGNOSIS — I110 Hypertensive heart disease with heart failure: Secondary | ICD-10-CM

## 2022-10-30 DIAGNOSIS — S72141A Displaced intertrochanteric fracture of right femur, initial encounter for closed fracture: Secondary | ICD-10-CM

## 2022-10-30 DIAGNOSIS — I5023 Acute on chronic systolic (congestive) heart failure: Secondary | ICD-10-CM

## 2022-10-30 DIAGNOSIS — S72024A Nondisplaced fracture of epiphysis (separation) (upper) of right femur, initial encounter for closed fracture: Secondary | ICD-10-CM | POA: Diagnosis not present

## 2022-10-30 HISTORY — PX: INTRAMEDULLARY (IM) NAIL INTERTROCHANTERIC: SHX5875

## 2022-10-30 LAB — BASIC METABOLIC PANEL
Anion gap: 11 (ref 5–15)
BUN: 29 mg/dL — ABNORMAL HIGH (ref 8–23)
CO2: 20 mmol/L — ABNORMAL LOW (ref 22–32)
Calcium: 8.9 mg/dL (ref 8.9–10.3)
Chloride: 108 mmol/L (ref 98–111)
Creatinine, Ser: 0.86 mg/dL (ref 0.44–1.00)
GFR, Estimated: >60 mL/min (ref >60)
Glucose, Bld: 105 mg/dL — ABNORMAL HIGH (ref 70–99)
Potassium: 4.1 mmol/L (ref 3.5–5.1)
Sodium: 139 mmol/L (ref 135–145)

## 2022-10-30 LAB — CBC
HCT: 41.5 % (ref 36.0–46.0)
Hemoglobin: 12.8 g/dL (ref 12.0–15.0)
MCH: 28.2 pg (ref 26.0–34.0)
MCHC: 30.8 g/dL (ref 30.0–36.0)
MCV: 91.4 fL (ref 80.0–100.0)
Platelets: 188 10*3/uL (ref 150–400)
RBC: 4.54 MIL/uL (ref 3.87–5.11)
RDW: 16.5 % — ABNORMAL HIGH (ref 11.5–15.5)
WBC: 16.9 10*3/uL — ABNORMAL HIGH (ref 4.0–10.5)
nRBC: 0 % (ref 0.0–0.2)

## 2022-10-30 LAB — MAGNESIUM: Magnesium: 2.2 mg/dL (ref 1.7–2.4)

## 2022-10-30 SURGERY — FIXATION, FRACTURE, INTERTROCHANTERIC, WITH INTRAMEDULLARY ROD
Anesthesia: General | Site: Hip | Laterality: Right

## 2022-10-30 MED ORDER — ACETAMINOPHEN 500 MG PO TABS
500.0000 mg | ORAL_TABLET | Freq: Three times a day (TID) | ORAL | Status: DC
  Administered 2022-10-30 – 2022-11-03 (×12): 500 mg via ORAL
  Filled 2022-10-30 (×12): qty 1

## 2022-10-30 MED ORDER — ACETAMINOPHEN 325 MG PO TABS
650.0000 mg | ORAL_TABLET | Freq: Four times a day (QID) | ORAL | Status: DC | PRN
  Filled 2022-10-30: qty 2

## 2022-10-30 MED ORDER — CHLORHEXIDINE GLUCONATE 4 % EX SOLN: 60.0000 mL | Freq: Once | CUTANEOUS | Status: DC

## 2022-10-30 MED ORDER — ACETAMINOPHEN 325 MG PO TABS: 650.0000 mg | ORAL_TABLET | Freq: Four times a day (QID) | ORAL | Status: DC

## 2022-10-30 MED ORDER — PHENOL 1.4 % MT LIQD: 1.0000 | OROMUCOSAL | Status: DC | PRN

## 2022-10-30 MED ORDER — ETOMIDATE 2 MG/ML IV SOLN
INTRAVENOUS | Status: DC | PRN
  Administered 2022-10-30: 6 mg via INTRAVENOUS

## 2022-10-30 MED ORDER — FENTANYL CITRATE (PF) 100 MCG/2ML IJ SOLN
INTRAMUSCULAR | Status: AC
  Filled 2022-10-30: qty 2

## 2022-10-30 MED ORDER — VANCOMYCIN HCL IN DEXTROSE 1-5 GM/200ML-% IV SOLN
1000.0000 mg | Freq: Two times a day (BID) | INTRAVENOUS | Status: AC
  Administered 2022-10-31: 1000 mg via INTRAVENOUS
  Filled 2022-10-30: qty 200

## 2022-10-30 MED ORDER — OXYCODONE HCL 5 MG PO TABS
5.0000 mg | ORAL_TABLET | ORAL | Status: DC | PRN
  Administered 2022-10-31 – 2022-11-01 (×2): 5 mg via ORAL
  Filled 2022-10-30 (×2): qty 1

## 2022-10-30 MED ORDER — POLYETHYLENE GLYCOL 3350 17 G PO PACK
17.0000 g | PACK | Freq: Two times a day (BID) | ORAL | Status: DC
  Administered 2022-10-30 – 2022-11-03 (×5): 17 g via ORAL
  Filled 2022-10-30 (×8): qty 1

## 2022-10-30 MED ORDER — METOPROLOL TARTRATE 5 MG/5ML IV SOLN
2.5000 mg | Freq: Once | INTRAVENOUS | Status: AC
  Administered 2022-10-30: 2.5 mg via INTRAVENOUS
  Filled 2022-10-30: qty 5

## 2022-10-30 MED ORDER — ACETAMINOPHEN 10 MG/ML IV SOLN
INTRAVENOUS | Status: AC
  Filled 2022-10-30: qty 100

## 2022-10-30 MED ORDER — DEXAMETHASONE SODIUM PHOSPHATE 10 MG/ML IJ SOLN
INTRAMUSCULAR | Status: AC
  Filled 2022-10-30: qty 1

## 2022-10-30 MED ORDER — DOCUSATE SODIUM 100 MG PO CAPS
100.0000 mg | ORAL_CAPSULE | Freq: Two times a day (BID) | ORAL | Status: DC
  Administered 2022-10-30 – 2022-11-03 (×6): 100 mg via ORAL
  Filled 2022-10-30 (×8): qty 1

## 2022-10-30 MED ORDER — ONDANSETRON HCL 4 MG/2ML IJ SOLN: 4.0000 mg | Freq: Four times a day (QID) | INTRAMUSCULAR | Status: DC | PRN

## 2022-10-30 MED ORDER — LIDOCAINE HCL (PF) 2 % IJ SOLN
INTRAMUSCULAR | Status: AC
  Filled 2022-10-30: qty 5

## 2022-10-30 MED ORDER — DROPERIDOL 2.5 MG/ML IJ SOLN: 0.6250 mg | Freq: Once | INTRAMUSCULAR | Status: DC | PRN

## 2022-10-30 MED ORDER — DEXAMETHASONE SODIUM PHOSPHATE 10 MG/ML IJ SOLN
INTRAMUSCULAR | Status: DC | PRN
  Administered 2022-10-30: 5 mg via INTRAVENOUS

## 2022-10-30 MED ORDER — DIPHENHYDRAMINE HCL 12.5 MG/5ML PO ELIX: 12.5000 mg | ORAL_SOLUTION | ORAL | Status: DC | PRN

## 2022-10-30 MED ORDER — VANCOMYCIN HCL IN DEXTROSE 1-5 GM/200ML-% IV SOLN
1000.0000 mg | INTRAVENOUS | Status: AC
  Administered 2022-10-30: 1000 mg via INTRAVENOUS

## 2022-10-30 MED ORDER — FENTANYL CITRATE PF 50 MCG/ML IJ SOSY: 25.0000 ug | PREFILLED_SYRINGE | INTRAMUSCULAR | Status: DC | PRN

## 2022-10-30 MED ORDER — ALPRAZOLAM 0.25 MG PO TABS
0.2500 mg | ORAL_TABLET | Freq: Two times a day (BID) | ORAL | Status: DC | PRN
  Administered 2022-10-30 – 2022-11-01 (×3): 0.25 mg via ORAL
  Filled 2022-10-30 (×4): qty 1

## 2022-10-30 MED ORDER — AMIODARONE HCL IN DEXTROSE 360-4.14 MG/200ML-% IV SOLN
30.0000 mg/h | INTRAVENOUS | Status: DC
  Administered 2022-10-30 – 2022-11-01 (×3): 30 mg/h via INTRAVENOUS
  Filled 2022-10-30 (×3): qty 200

## 2022-10-30 MED ORDER — GENTAMICIN SULFATE 40 MG/ML IJ SOLN
280.0000 mg | INTRAVENOUS | Status: AC
  Administered 2022-10-30: 280 mg via INTRAVENOUS
  Filled 2022-10-30: qty 7

## 2022-10-30 MED ORDER — METHOCARBAMOL 1000 MG/10ML IJ SOLN: 500.0000 mg | Freq: Four times a day (QID) | INTRAVENOUS | Status: DC | PRN

## 2022-10-30 MED ORDER — SODIUM CHLORIDE 0.9 % IV BOLUS: 500.0000 mL | Freq: Once | INTRAVENOUS | Status: DC

## 2022-10-30 MED ORDER — TRANEXAMIC ACID-NACL 1000-0.7 MG/100ML-% IV SOLN
1000.0000 mg | Freq: Once | INTRAVENOUS | Status: DC
  Filled 2022-10-30: qty 100

## 2022-10-30 MED ORDER — TRANEXAMIC ACID-NACL 1000-0.7 MG/100ML-% IV SOLN
INTRAVENOUS | Status: AC
  Filled 2022-10-30: qty 100

## 2022-10-30 MED ORDER — AMIODARONE HCL IN DEXTROSE 360-4.14 MG/200ML-% IV SOLN
60.0000 mg/h | INTRAVENOUS | Status: AC
  Administered 2022-10-30: 60 mg/h via INTRAVENOUS
  Filled 2022-10-30: qty 200

## 2022-10-30 MED ORDER — MENTHOL 3 MG MT LOZG: 1.0000 | LOZENGE | OROMUCOSAL | Status: DC | PRN

## 2022-10-30 MED ORDER — 0.9 % SODIUM CHLORIDE (POUR BTL) OPTIME
TOPICAL | Status: DC | PRN
  Administered 2022-10-30: 1000 mL

## 2022-10-30 MED ORDER — LACTATED RINGERS IV SOLN: INTRAVENOUS | Status: DC | PRN

## 2022-10-30 MED ORDER — POVIDONE-IODINE 10 % EX SWAB: 2.0000 | Freq: Once | CUTANEOUS | Status: DC

## 2022-10-30 MED ORDER — SODIUM CHLORIDE 0.9 % IV SOLN: INTRAVENOUS | Status: DC

## 2022-10-30 MED ORDER — METHOCARBAMOL 500 MG PO TABS
500.0000 mg | ORAL_TABLET | Freq: Four times a day (QID) | ORAL | Status: DC | PRN
  Administered 2022-11-02: 500 mg via ORAL
  Filled 2022-10-30: qty 1

## 2022-10-30 MED ORDER — METOCLOPRAMIDE HCL 5 MG PO TABS: 5.0000 mg | ORAL_TABLET | Freq: Three times a day (TID) | ORAL | Status: DC | PRN

## 2022-10-30 MED ORDER — DEXAMETHASONE SODIUM PHOSPHATE 10 MG/ML IJ SOLN: 10.0000 mg | Freq: Once | INTRAMUSCULAR | Status: DC

## 2022-10-30 MED ORDER — HYDROMORPHONE HCL 1 MG/ML IJ SOLN: 0.5000 mg | INTRAMUSCULAR | Status: DC | PRN

## 2022-10-30 MED ORDER — ROCURONIUM BROMIDE 10 MG/ML (PF) SYRINGE
PREFILLED_SYRINGE | INTRAVENOUS | Status: DC | PRN
  Administered 2022-10-30: 30 mg via INTRAVENOUS

## 2022-10-30 MED ORDER — BISACODYL 10 MG RE SUPP: 10.0000 mg | Freq: Every day | RECTAL | Status: DC | PRN

## 2022-10-30 MED ORDER — ROCURONIUM BROMIDE 10 MG/ML (PF) SYRINGE
PREFILLED_SYRINGE | INTRAVENOUS | Status: AC
  Filled 2022-10-30: qty 10

## 2022-10-30 MED ORDER — PHENYLEPHRINE HCL-NACL 20-0.9 MG/250ML-% IV SOLN
INTRAVENOUS | Status: DC | PRN
Start: 2022-10-30 — End: 2022-10-30
  Administered 2022-10-30: 50 ug/min via INTRAVENOUS

## 2022-10-30 MED ORDER — TRANEXAMIC ACID-NACL 1000-0.7 MG/100ML-% IV SOLN
1000.0000 mg | INTRAVENOUS | Status: AC
  Administered 2022-10-30: 1000 mg via INTRAVENOUS

## 2022-10-30 MED ORDER — VANCOMYCIN HCL IN DEXTROSE 1-5 GM/200ML-% IV SOLN
INTRAVENOUS | Status: AC
  Filled 2022-10-30: qty 200

## 2022-10-30 MED ORDER — PROPOFOL 10 MG/ML IV BOLUS
INTRAVENOUS | Status: AC
  Filled 2022-10-30: qty 20

## 2022-10-30 MED ORDER — ONDANSETRON HCL 4 MG/2ML IJ SOLN
INTRAMUSCULAR | Status: AC
  Filled 2022-10-30: qty 2

## 2022-10-30 MED ORDER — ETOMIDATE 2 MG/ML IV SOLN
INTRAVENOUS | Status: AC
  Filled 2022-10-30: qty 10

## 2022-10-30 MED ORDER — ASPIRIN 81 MG PO CHEW
81.0000 mg | CHEWABLE_TABLET | Freq: Two times a day (BID) | ORAL | Status: DC
  Administered 2022-10-30 – 2022-10-31 (×2): 81 mg via ORAL
  Filled 2022-10-30 (×2): qty 1

## 2022-10-30 MED ORDER — OXYCODONE HCL 5 MG PO TABS: 10.0000 mg | ORAL_TABLET | ORAL | Status: DC | PRN

## 2022-10-30 MED ORDER — LIDOCAINE 2% (20 MG/ML) 5 ML SYRINGE
INTRAMUSCULAR | Status: DC | PRN
  Administered 2022-10-30: 50 mg via INTRAVENOUS

## 2022-10-30 MED ORDER — METOCLOPRAMIDE HCL 5 MG/ML IJ SOLN: 5.0000 mg | Freq: Three times a day (TID) | INTRAMUSCULAR | Status: DC | PRN

## 2022-10-30 MED ORDER — ONDANSETRON HCL 4 MG/2ML IJ SOLN
INTRAMUSCULAR | Status: DC | PRN
  Administered 2022-10-30: 4 mg via INTRAVENOUS

## 2022-10-30 MED ORDER — OXYCODONE HCL 5 MG PO TABS: 2.5000 mg | ORAL_TABLET | ORAL | Status: DC | PRN

## 2022-10-30 MED ORDER — ONDANSETRON HCL 4 MG PO TABS: 4.0000 mg | ORAL_TABLET | Freq: Four times a day (QID) | ORAL | Status: DC | PRN

## 2022-10-30 SURGICAL SUPPLY — 39 items
ADH SKN CLS APL DERMABOND .7 (GAUZE/BANDAGES/DRESSINGS) ×1
BAG COUNTER SPONGE SURGICOUNT (BAG) IMPLANT
BAG SPEC THK2 15X12 ZIP CLS (MISCELLANEOUS) ×1
BAG SPNG CNTER NS LX DISP (BAG)
BAG ZIPLOCK 12X15 (MISCELLANEOUS) ×1 IMPLANT
BIT DRILL CANN LG 4.3MM (BIT) IMPLANT
COVER PERINEAL POST (MISCELLANEOUS) ×1 IMPLANT
COVER SURGICAL LIGHT HANDLE (MISCELLANEOUS) ×1 IMPLANT
DERMABOND ADVANCED .7 DNX12 (GAUZE/BANDAGES/DRESSINGS) ×1 IMPLANT
DRAPE STERI IOBAN 125X83 (DRAPES) ×1 IMPLANT
DRILL BIT CANN LG 4.3MM (BIT) ×1
DRSG AQUACEL AG ADV 3.5X 4 (GAUZE/BANDAGES/DRESSINGS) IMPLANT
DRSG AQUACEL AG ADV 3.5X 6 (GAUZE/BANDAGES/DRESSINGS) ×2 IMPLANT
DURAPREP 26ML APPLICATOR (WOUND CARE) ×1 IMPLANT
ELECT REM PT RETURN 15FT ADLT (MISCELLANEOUS) IMPLANT
FACESHIELD WRAPAROUND (MASK) ×1 IMPLANT
FACESHIELD WRAPAROUND OR TEAM (MASK) ×1 IMPLANT
GLOVE BIO SURGEON STRL SZ 6 (GLOVE) ×1 IMPLANT
GLOVE BIOGEL PI IND STRL 6.5 (GLOVE) ×1 IMPLANT
GLOVE BIOGEL PI IND STRL 7.5 (GLOVE) ×2 IMPLANT
GLOVE ORTHO TXT STRL SZ7.5 (GLOVE) ×1 IMPLANT
GOWN STRL REUS W/ TWL LRG LVL3 (GOWN DISPOSABLE) ×2 IMPLANT
GOWN STRL REUS W/TWL LRG LVL3 (GOWN DISPOSABLE) ×2
GUIDEPIN VERSANAIL DSP 3.2X444 (ORTHOPEDIC DISPOSABLE SUPPLIES) IMPLANT
KIT BASIN OR (CUSTOM PROCEDURE TRAY) ×1 IMPLANT
KIT TURNOVER KIT A (KITS) IMPLANT
MANIFOLD NEPTUNE II (INSTRUMENTS) ×1 IMPLANT
NAIL HIP FRACT 130D 11X180 (Screw) IMPLANT
PACK GENERAL/GYN (CUSTOM PROCEDURE TRAY) ×1 IMPLANT
PROTECTOR NERVE ULNAR (MISCELLANEOUS) ×1 IMPLANT
SCREW BONE CORTICAL 5.0X36 (Screw) IMPLANT
SCREW LAG 10.5MMX105MM HFN (Screw) IMPLANT
SUT MNCRL AB 4-0 PS2 18 (SUTURE) ×1 IMPLANT
SUT VIC AB 1 CT1 27 (SUTURE) ×1
SUT VIC AB 1 CT1 27XBRD ANBCTR (SUTURE) ×1 IMPLANT
SUT VIC AB 2-0 CT1 27 (SUTURE) ×1
SUT VIC AB 2-0 CT1 TAPERPNT 27 (SUTURE) ×1 IMPLANT
TOWEL OR 17X26 10 PK STRL BLUE (TOWEL DISPOSABLE) ×1 IMPLANT
TOWEL OR NON WOVEN STRL DISP B (DISPOSABLE) ×1 IMPLANT

## 2022-10-30 NOTE — Anesthesia Postprocedure Evaluation (Signed)
Anesthesia Post Note  Patient: [name]  Procedure(s) Performed: INTRAMEDULLARY (IM) NAIL INTERTROCHANTERIC (Right: Hip)     Patient location during evaluation: PACU Anesthesia Type: General Level of consciousness: sedated and patient cooperative Pain management: pain level controlled Vital Signs Assessment: post-procedure vital signs reviewed and stable Respiratory status: spontaneous breathing Cardiovascular status: stable Anesthetic complications: no   No notable events documented.  Last Vitals:  Vitals:   10/30/22 0945 10/30/22 1000  BP: 123/84   Pulse: 97 95  Resp: 12 17  Temp:    SpO2: 95% 100%    Last Pain:  Vitals:   10/30/22 1000  TempSrc:   PainSc: 0-No pain                 Lewie Loron

## 2022-10-30 NOTE — Interval H&P Note (Signed)
History and Physical Interval Note:  10/30/2022 7:58 AM  [name]  has presented today for surgery, with the diagnosis of RIGHT INTERTROCH FRACTURE.  The various methods of treatment have been discussed with the patient and family. After consideration of risks, benefits and other options for treatment, the patient has consented to  Procedure(s): INTRAMEDULLARY (IM) NAIL INTERTROCHANTERIC (Right) as a surgical intervention.  The patient's history has been reviewed, patient examined, no change in status, stable for surgery.  I have reviewed the patient's chart and labs.  Questions were answered to the patient's satisfaction.     Shelda Pal

## 2022-10-30 NOTE — Transfer of Care (Signed)
Immediate Anesthesia Transfer of Care Note  Patient: [name]  Procedure(s) Performed: INTRAMEDULLARY (IM) NAIL INTERTROCHANTERIC (Right: Hip)  Patient Location: PACU  Anesthesia Type:General  Level of Consciousness: sedated  Airway & Oxygen Therapy: Patient Spontanous Breathing and Patient connected to face mask oxygen  Post-op Assessment: Report given to RN and Post -op Vital signs reviewed and stable  Post vital signs: Reviewed and stable  Last Vitals:  Vitals Value Taken Time  BP 119/81 10/30/22 0920  Temp    Pulse    Resp 11 10/30/22 0922  SpO2    Vitals shown include unfiled device data.  Last Pain:  Vitals:   10/29/22 2228  TempSrc:   PainSc: 7       Patients Stated Pain Goal: 3 (10/29/22 2228)  Complications: No notable events documented.

## 2022-10-30 NOTE — Plan of Care (Signed)
  Problem: Health Behavior/Discharge Planning: Goal: Ability to manage health-related needs will improve Outcome: Progressing   Problem: Clinical Measurements: Goal: Ability to maintain clinical measurements within normal limits will improve Outcome: Progressing Goal: Will remain free from infection Outcome: Progressing Goal: Diagnostic test results will improve Outcome: Progressing Goal: Respiratory complications will improve Outcome: Progressing Goal: Cardiovascular complication will be avoided Outcome: Progressing   Problem: Activity: Goal: Risk for activity intolerance will decrease Outcome: Progressing   Problem: Nutrition: Goal: Adequate nutrition will be maintained Outcome: Progressing   Problem: Coping: Goal: Level of anxiety will decrease Outcome: Progressing   Problem: Elimination: Goal: Will not experience complications related to bowel motility Outcome: Progressing Goal: Will not experience complications related to urinary retention Outcome: Progressing   Problem: Pain Managment: Goal: General experience of comfort will improve Outcome: Progressing   Problem: Safety: Goal: Ability to remain free from injury will improve Outcome: Progressing   Problem: Skin Integrity: Goal: Risk for impaired skin integrity will decrease Outcome: Progressing   Problem: Education: Goal: Verbalization of understanding the information provided (i.e., activity precautions, restrictions, etc) will improve Outcome: Progressing   Problem: Activity: Goal: Ability to ambulate and perform ADLs will improve Outcome: Progressing   Problem: Clinical Measurements: Goal: Postoperative complications will be avoided or minimized Outcome: Progressing   Problem: Self-Concept: Goal: Ability to maintain and perform role responsibilities to the fullest extent possible will improve Outcome: Progressing   Problem: Pain Management: Goal: Pain level will decrease Outcome: Progressing   

## 2022-10-30 NOTE — Progress Notes (Signed)
Patient ID: [name], female   DOB: [birth year], 89 y.o.   MRN: [id number]  Right hip fracture Somewhat increased confusion this am since admission  I spoke to her friend of 30 years and HCPOA about her current injury as well as some recent decline in her mentation  We discussed the injury and the plan to proceed with IM nail of her right hip today  NPO Consent signed "verbally" by Michaelle Birks her [facility]

## 2022-10-30 NOTE — Op Note (Signed)
[name], [name] MEDICAL RECORD NO: [id number] ACCOUNT NO: [id number] DATE OF BIRTH: [birth year] FACILITY: [facility] LOCATION: [facility] PHYSICIAN: Madlyn Frankel. Charlann Boxer, MD  Operative Report   DATE OF PROCEDURE: 10/30/2022  PREOPERATIVE DIAGNOSIS:  Right intertrochanteric femur fracture.  POSTOPERATIVE DIAGNOSIS:  Right intertrochanteric femur fracture.  PROCEDURE:  Closed reduction, intramedullary nailing of right intertrochanteric femur fracture utilizing a Zimmer Affixus nail 11 x 180 mm with 130-degree lag screw and a distal interlock.  SURGEON:  Madlyn Frankel. Charlann Boxer, MD  ASSISTANT:  Rosalene Billings, PA-C.  Note that [name] was present for the entirety of the case to assist with preoperative positioning, management of the operative extremity, assisting while maintaining a reduction of the fracture, general  facilitation of the case and primary wound closure.  ANESTHESIA:  General.  BLOOD LOSS:  Less than 100 mL.  COMPLICATIONS:  None.  DRAINS:  None.  INDICATIONS FOR THE PROCEDURE:  The patient is a 89 year old female who unfortunately had a ground level fall.  This resulted in pain that prevented her from getting up and move around.  She was brought to the emergency room where radiographs revealed a  right intertrochanteric femur fracture.  Based on some recent mental decline, we spoke to her healthcare power of attorney to review the indications for the procedure.  Risks of nonunion, malunion, and need for future surgeries were discussed and  reviewed versus nonoperative care.  Consent was obtained for the benefit of pain relief.  PROCEDURE IN DETAIL:  The patient was brought to the operative theater.  Once adequate anesthesia, preoperative antibiotics, Ancef administered, she was positioned supine with the right leg in a traction boot.  Once she was safely and comfortably  positioned and padded on the operative table with her left lower extremity flexed and abducted out of the way,  fluoroscopy was brought to the field.  With traction and internal rotation of the right lower extremity, we confirmed that the fracture was  reduced into a near anatomic position.  The right lower extremity was then prepped and draped in sterile fashion.  A timeout was performed identifying the patient, planned procedure, and extremity.  Fluoroscopy was brought back to the field.  A guidewire  was placed into the tip of the trochanter and passed across the fracture site, confirmed radiographically.  The proximal femur was then opened.  I then passed an 11 x 180 mm nail by hand to its appropriate depth.  We then positioned a guidewire into  this slightly inferior aspect of the femoral head on the AP view and centrally on the lateral view.  I measured and selected a 105 mm lag screw.  We drilled for this and then passed the lag screw.  Once it was in its appropriate position, the traction  was let off of the lower extremity and I used the compression wheel to medialize the shaft of the femur to the fracture site.  I then tightened the locking bolt proximally and backed off a quarter of turn for compression.  Once this was done, we placed a  distal interlock.  Final radiographs were obtained.  We then irrigated all wounds.  We reapproximated the gluteal fascia using #1 Vicryl.  All remaining wounds were closed with 2-0 Vicryl and a running Monocryl stitch.  The wounds were clean, dry and  dressed sterilely using surgical glue and Aquacel dressing.  She was then brought to the recovery room in stable condition, tolerating the procedure well.  Findings reviewed with her  healthcare power of attorney.  Postoperatively, she will be  weightbearing as tolerated.  We will see her back in the office in 2 weeks from discharge for wound and x-ray evaluation.   NIK D: 10/30/2022 9:11:55 am T: 10/30/2022 9:32:00 am  JOB: [id number]/ [id number]

## 2022-10-30 NOTE — Progress Notes (Signed)
SLP Cancellation Note  Patient Details Name: [name] MRN: [id number] DOB: 1932/06/03   Cancelled treatment:       Reason Eval/Treat Not Completed: Patient at procedure or test/unavailable. Patient in surgery for right hip fracture. SLP will f/u when back on PO's. Per order, patient coughing after sips of water. She has h/o GERD and Barrett's Esophagus but no documented h/o oropharyngeal dysphagia.     Angela Nevin, MA, CCC-SLP Speech Therapy

## 2022-10-30 NOTE — Anesthesia Procedure Notes (Deleted)
Arterial Line Insertion Start/End8/24/2024 8:05 AM, 10/30/2022 8:09 AM Performed by: Lewie Loron, MD, anesthesiologist  Patient location: OR. Preanesthetic checklist: patient identified, IV checked, site marked, risks and benefits discussed, surgical consent, monitors and equipment checked, pre-op evaluation, timeout performed and anesthesia consent Lidocaine 1% used for infiltration Left, radial was placed Catheter size: 20 G Hand hygiene performed   Attempts: 1 Procedure performed without using ultrasound guided technique. Ultrasound Notes:anatomy identified, needle tip was noted to be adjacent to the nerve/plexus identified and no ultrasound evidence of intravascular and/or intraneural injection Following insertion, dressing applied and Biopatch. Post procedure assessment: normal  Patient tolerated the procedure well with no immediate complications. Additional procedure comments: Aline inserted per Dr. Renold Don prior to induction.

## 2022-10-30 NOTE — Progress Notes (Signed)
Called PA Irish Elders and she okayed to not give tranexamic acid to patient.

## 2022-10-30 NOTE — Anesthesia Procedure Notes (Signed)
Arterial Line Insertion Start/End8/24/2024 8:02 AM, 10/30/2022 8:09 AM Performed by: Lewie Loron, MD  Patient location: Pre-op. Preanesthetic checklist: patient identified, IV checked, site marked, risks and benefits discussed, surgical consent, monitors and equipment checked, pre-op evaluation, timeout performed and anesthesia consent Lidocaine 1% used for infiltration Left, radial was placed Catheter size: 20 G Hand hygiene performed , maximum sterile barriers used  and Seldinger technique used  Attempts: 1 Procedure performed without using ultrasound guided technique. Following insertion, dressing applied and Biopatch. Post procedure assessment: normal and unchanged  Patient tolerated the procedure well with no immediate complications.

## 2022-10-30 NOTE — Progress Notes (Signed)
[facility] Progress Note Patient: [name] QVZ:[id number] DOB: [birth year] DOA: 10/28/2022  DOS: the patient was seen and examined on 10/30/2022  Brief hospital course: PMH of A-fib, chronic HFrEF, Barrett's esophagus, HTN, kyphoplasty, BiV pacemaker plan, hypothyroidism, GERD. Presented to the hospital with complaints of mechanical fall with injury to right hip. Found to have right femur fracture. Orthopedic consulted. Cardiology was consulted for preop clearance. Underwent right IM nailing 8/24.  Assessment and Plan: Right intertrochanteric hip fracture. Orthopedic consulted. Underwent right IM nailing on 8/24. Developed RVR postoperatively. Monitor for now.  Appreciate cardiology consultation.  Chronic HFrEF. Moderate MR. Severe TR. Paroxysmal A-fib.  Now with RVR Preop risk evaluation. Appreciate cardiology consultation for preop evaluation. While the patient is a high risk for surgical candidacy, there is no prohibitive risk per cardiology. EF 25%. On Xarelto which is currently on hold. On digoxin which I will continue.  On metoprolol which I will continue. Is a level adequate. Due to RVR patient was given IV Lopressor which did not slow her heart rate down and therefore now on IV amiodarone.  Hypertension chronic. On midodrine.  Continue.  BiV PPM implant. Monitor for now.  Severe malnutrition. Body mass index is 18.25 kg/m.  Will have dietitian follow the patient.   Delirium. Likely postop. Monitor for now. Xanax as needed.   Subjective: Seen after the surgery.  Developed RVR.  Was trying to pull out of her IVs.  Later on becoming agitated while placing the new IV.  Physical Exam: General: in Mild distress, No Rash Cardiovascular: S1 and S2 Present, No Murmur Respiratory: Good respiratory effort, Bilateral Air entry present. No Crackles, No wheezes Abdomen: Bowel Sound present, No tenderness Extremities: No edema Neuro: Alert and oriented x3, no  new focal deficit  Data Reviewed: I have Reviewed nursing notes, Vitals, and Lab results. Since last encounter, pertinent lab results CBC and BMP   . I have ordered test including CBC and BMP  .   Disposition: Status is: Inpatient Remains inpatient appropriate because: Awaiting improvement in mentation and heart rate  SCDs Start: 10/30/22 1038 Place TED hose Start: 10/30/22 1038 SCDs Start: 10/28/22 1912   Family Communication: No one at bedside Level of care: Progressive switch from telemetry to progressive for needs of IV amiodarone. Vitals:   10/30/22 1500 10/30/22 1600 10/30/22 1658 10/30/22 1700  BP:   (!) 121/52 (!) 115/57  Pulse:      Resp: (!) 22 17 16 18   Temp:      TempSrc:      SpO2:      Weight:      Height:         Author: Lynden Oxford, MD 10/30/2022 5:48 PM  Please look on [email or web address] to find out who is on call.

## 2022-10-30 NOTE — Anesthesia Procedure Notes (Signed)
Procedure Name: Intubation Date/Time: 10/30/2022 8:18 AM  Performed by: Doran Clay, CRNAPre-anesthesia Checklist: Patient identified, Emergency Drugs available, Suction available, Patient being monitored and Timeout performed Patient Re-evaluated:Patient Re-evaluated prior to induction Oxygen Delivery Method: Circle system utilized Preoxygenation: Pre-oxygenation with 100% oxygen Induction Type: IV induction Ventilation: Mask ventilation without difficulty Laryngoscope Size: Mac and 3 Grade View: Grade I Tube type: Oral Tube size: 7.0 mm Number of attempts: 1 Airway Equipment and Method: Stylet Placement Confirmation: ETT inserted through vocal cords under direct vision, positive ETCO2 and breath sounds checked- equal and bilateral Tube secured with: Tape Dental Injury: Teeth and Oropharynx as per pre-operative assessment

## 2022-10-30 NOTE — Brief Op Note (Signed)
10/28/2022 - 10/30/2022  9:06 AM  PATIENT:  [name]  89 y.o. female  PRE-OPERATIVE DIAGNOSIS:  RIGHT INTERTROCHANTERIC HIP FRACTURE  POST-OPERATIVE DIAGNOSIS:  RIGHT INTERTROCHANTERIC HIP FRACTURE  PROCEDURE:  Procedure(s): INTRAMEDULLARY (IM) NAIL INTERTROCHANTERIC (Right)  SURGEON:  Surgeons and Role:    Durene Romans, MD - Primary  PHYSICIAN ASSISTANT: Rosalene Billings, PA-C  ANESTHESIA:   general  EBL:  <100 cc   BLOOD ADMINISTERED:none  DRAINS: none   LOCAL MEDICATIONS USED:  NONE  SPECIMEN:  No Specimen  DISPOSITION OF SPECIMEN:  N/A  COUNTS:  YES  TOURNIQUET:  * No tourniquets in log *  DICTATION: .Other Dictation: Dictation Number [id number]  PLAN OF CARE: Admit to inpatient   PATIENT DISPOSITION:  PACU - hemodynamically stable.   Delay start of Pharmacological VTE agent (>24hrs) due to surgical blood loss or risk of bleeding: no

## 2022-10-30 NOTE — Discharge Instructions (Addendum)

## 2022-10-31 DIAGNOSIS — S72024A Nondisplaced fracture of epiphysis (separation) (upper) of right femur, initial encounter for closed fracture: Secondary | ICD-10-CM | POA: Diagnosis not present

## 2022-10-31 DIAGNOSIS — I4819 Other persistent atrial fibrillation: Secondary | ICD-10-CM

## 2022-10-31 LAB — CBC
HCT: 35.1 % — ABNORMAL LOW (ref 36.0–46.0)
Hemoglobin: 11.2 g/dL — ABNORMAL LOW (ref 12.0–15.0)
MCH: 28.3 pg (ref 26.0–34.0)
MCHC: 31.9 g/dL (ref 30.0–36.0)
MCV: 88.6 fL (ref 80.0–100.0)
Platelets: 179 10*3/uL (ref 150–400)
RBC: 3.96 MIL/uL (ref 3.87–5.11)
RDW: 16.4 % — ABNORMAL HIGH (ref 11.5–15.5)
WBC: 14.4 10*3/uL — ABNORMAL HIGH (ref 4.0–10.5)
nRBC: 0 % (ref 0.0–0.2)

## 2022-10-31 LAB — BASIC METABOLIC PANEL
Anion gap: 10 (ref 5–15)
BUN: 39 mg/dL — ABNORMAL HIGH (ref 8–23)
CO2: 20 mmol/L — ABNORMAL LOW (ref 22–32)
Calcium: 8.6 mg/dL — ABNORMAL LOW (ref 8.9–10.3)
Chloride: 106 mmol/L (ref 98–111)
Creatinine, Ser: 1.03 mg/dL — ABNORMAL HIGH (ref 0.44–1.00)
GFR, Estimated: 52 mL/min — ABNORMAL LOW (ref >60)
Glucose, Bld: 117 mg/dL — ABNORMAL HIGH (ref 70–99)
Potassium: 4.3 mmol/L (ref 3.5–5.1)
Sodium: 136 mmol/L (ref 135–145)

## 2022-10-31 MED ORDER — HEPARIN SODIUM (PORCINE) 5000 UNIT/ML IJ SOLN
5000.0000 [IU] | Freq: Three times a day (TID) | INTRAMUSCULAR | Status: DC
  Administered 2022-10-31 – 2022-11-03 (×8): 5000 [IU] via SUBCUTANEOUS
  Filled 2022-10-31 (×8): qty 1

## 2022-10-31 MED ORDER — RIVAROXABAN 15 MG PO TABS: 15.0000 mg | ORAL_TABLET | Freq: Every day | ORAL | Status: DC

## 2022-10-31 NOTE — Evaluation (Signed)
Clinical/Bedside Swallow Evaluation Patient Details  Name: [name] MRN: [id number] Date of Birth: [birth year]  Today's Date: 10/31/2022 Time: SLP Start Time (ACUTE ONLY): 5784 SLP Stop Time (ACUTE ONLY): 0915 SLP Time Calculation (min) (ACUTE ONLY): 20 min  Past Medical History:  Past Medical History:  Diagnosis Date   AAA (abdominal aortic aneurysm) (HCC)    Anxiety disorder    Barrett's esophagus 04/2008   Benign neoplasm of colon    Cataract    removed both eyes   Chronic airway obstruction, not elsewhere classified    Chronic HFrEF (heart failure with reduced ejection fraction) (HCC)    Complication of anesthesia    slow to wake up   Depressive disorder, not elsewhere classified    Diverticulitis    Erosive gastritis 08/2004   GERD (gastroesophageal reflux disease)    HTN (hypertension)    pt denies    Hyperplastic polyps of stomach 06/2002   Hypothyroidism    Iliac aneurysm (HCC)    Incisional hernia    Insomnia 08/20/2015   Kyphosis 08/20/2015   LBBB (left bundle branch block)    Melanoma (HCC)    right arm   Mitral regurgitation    Other and unspecified hyperlipidemia    Pacemaker 04/26/2012   PAF (paroxysmal atrial fibrillation) (HCC)    11/2016   Restless legs syndrome (RLS)    Synovial cyst of popliteal space    Unspecified chronic bronchitis (HCC)    Past Surgical History:  Past Surgical History:  Procedure Laterality Date   ABDOMINAL HYSTERECTOMY  1076   endometriosis   APPENDECTOMY     BI-VENTRICULAR PACEMAKER INSERTION N/A 04/26/2012   Procedure: BI-VENTRICULAR PACEMAKER INSERTION (CRT-P);  Surgeon: Marinus Maw, MD;  Location: [facility] CATH LAB;  Service: Cardiovascular;  Laterality: N/A;   BIV PACEMAKER GENERATOR CHANGEOUT N/A 01/26/2019   Procedure: BIV PACEMAKER GENERATOR CHANGEOUT;  Surgeon: Marinus Maw, MD;  Location: [facility] INVASIVE CV LAB;  Service: Cardiovascular;  Laterality: N/A;   CARDIOVERSION N/A 05/06/2020   Procedure: CARDIOVERSION;   Surgeon: Chrystie Nose, MD;  Location: [facility] ENDOSCOPY;  Service: Cardiovascular;  Laterality: N/A;   CARDIOVERSION N/A 05/19/2020   Procedure: CARDIOVERSION;  Surgeon: Jake Bathe, MD;  Location: [facility] ENDOSCOPY;  Service: Cardiovascular;  Laterality: N/A;   COLONOSCOPY     defibrillator insertion     s/p removal of previously implanted BiV ICD and insertion of a new BiV pacemaker on 04/26/12   excise mole of lip  2001   Dr. Everlene Farrier   excision of wen  1984   Dr. Valrie Hart   FEMORAL HERNIA REPAIR  12/25/2012   Dr Derrell Lolling   FEMORAL HERNIA REPAIR  01/04/2013   Recurrent - Dr. Darin Engels HERNIA REPAIR N/A 10/01/2013   Procedure: LAPAROSCOPIC INCISIONAL HERNIA;  Surgeon: Atilano Ina, MD;  Location: [facility] ORS;  Service: General;  Laterality: N/A;   INCISIONAL HERNIA REPAIR N/A 09/07/2018   Procedure: LAPAROSCOPIC ASSISTED REPAIR OF INCISIONAL HERNIA;  Surgeon: Gaynelle Adu, MD;  Location: [facility] OR;  Service: General;  Laterality: N/A;   INGUINAL HERNIA REPAIR Right 09/26/2012   Procedure: RIGHT INGUINAL HERNIA REPAIR WITH MESH;  Surgeon: Adolph Pollack, MD;  Location: [facility] OR;  Service: General;  Laterality: Right;   INGUINAL HERNIA REPAIR Right 12/25/2012   Procedure: explor right groin, small bowel rescection, tissue repair right femoral hernia;  Surgeon: Ernestene Mention, MD;  Location: [facility] ORS;  Service: General;  Laterality: Right;   INSERTION OF  MESH Right 09/26/2012   Procedure: INSERTION OF MESH;  Surgeon: Adolph Pollack, MD;  Location: [facility] OR;  Service: General;  Laterality: Right;   INSERTION OF MESH N/A 10/01/2013   Procedure: INSERTION OF MESH;  Surgeon: Atilano Ina, MD;  Location: [facility] ORS;  Service: General;  Laterality: N/A;   LAPAROSCOPY N/A 01/04/2013   Procedure: Diagnostic Laparoscopy, exploratory laparotomy with small bowel resection, closure of right femoral hernia repair;  Surgeon: Lodema Pilot, DO;  Location: [facility]S;  Service: General;  Laterality: N/A;   Left arm surgery      POLYPECTOMY     RIGHT BREAST LUMPECTOMY  1988   Dr. Francina Ames   right knee surgery     arthroscopy: Dr. Simonne Come   TONSILLECTOMY     UPPER GASTROINTESTINAL ENDOSCOPY     HPI:  Patient is an 89 y.o. female with PMH: frequent falls, anxiety disorder, GERD, Barrett's esophagus, erosive gastritis, kyphosis, HTN, a-fib, depressive disorder. She presented to the hospital on 10/28/22 from her SNF following unwitnessed fall. ER workup revealed right femoral fracture and she had surgery yesterday(intramedullary nail intertrochanteric of right hip). Of note, she was admitted in June of 2024 following fall and at that time, she lived alone. SLP swallow evaluation ordered as patient observed to be coughing after sips of water. (this was ordered prior to surgery)    Assessment / Plan / Recommendation  Clinical Impression  Patient is presenting with clinical s/s of dysphagia as per this bedside swallow evaluation which was limited secondary to limited patient participation leading to limited PO intake. When SLP first arrived, patient suspicious that he was trying to do more surgeries on her. She did not want to participate at all. SLP left room and during that time, cardiologist arrived to speak to patient. SLP overheard patient telling cardiologist similar things about not wanting surgery. SLP assisted cardiologist in repositioning patient in bed and patient stating that she did not want to see either of [place] ever again. SLP left again to retrieve some juices to offer patient. She was receptive to having some sips of orange juice (thin consistency). She exhibited a weak, dry sounding delayed cough after sips and then would wince and touch right flank/hip area. Pain would quickly subside. No change in voice or vitals. Patient declined to have any other PO's. SLP recommending to continue with Dys 3 (mechanical soft) solids, thin liquids for now and will plan to return next 1-2 dates to ensure PO toleration. Her  dysphagia is likely chronic. In addition, although no diagnosis of dementia, per chart review, patient has had decline in her memory. SLP Visit Diagnosis: Dysphagia, unspecified (R13.10)    Aspiration Risk  Mild aspiration risk    Diet Recommendation Dysphagia 3 (Mech soft);Thin liquid    Liquid Administration via: Cup;Straw Medication Administration: Other (Comment) (as tolerated) Supervision: Patient able to self feed;Intermittent supervision to cue for compensatory strategies Compensations: Slow rate;Small sips/bites;Minimize environmental distractions Postural Changes: Seated upright at 90 degrees    Other  Recommendations Oral Care Recommendations: Oral care BID    Recommendations for follow up therapy are one component of a multi-disciplinary discharge planning process, led by the attending physician.  Recommendations may be updated based on patient status, additional functional criteria and insurance authorization.  Follow up Recommendations Skilled nursing-short term rehab (<3 hours/day)      Assistance Recommended at Discharge    Functional Status Assessment Patient has had a recent decline in their functional status and demonstrates the  ability to make significant improvements in function in a reasonable and predictable amount of time.  Frequency and Duration min 2x/week  1 week       Prognosis Prognosis for improved oropharyngeal function: Fair Barriers to Reach Goals: Cognitive deficits;Time post onset      Swallow Study   General Date of Onset: 10/30/22 HPI: Patient is an 89 y.o. female with PMH: frequent falls, anxiety disorder, GERD, Barrett's esophagus, erosive gastritis, kyphosis, HTN, a-fib, depressive disorder. She presented to the hospital on 10/28/22 from her SNF following unwitnessed fall. ER workup revealed right femoral fracture and she had surgery yesterday(intramedullary nail intertrochanteric of right hip). Of note, she was admitted in June of 2024  following fall and at that time, she lived alone. SLP swallow evaluation ordered as patient observed to be coughing after sips of water. (this was ordered prior to surgery) Type of Study: Bedside Swallow Evaluation Previous Swallow Assessment: none found Diet Prior to this Study: Dysphagia 3 (mechanical soft);Thin liquids (Level 0) Temperature Spikes Noted: No Respiratory Status: Room air History of Recent Intubation: No Total duration of intubation (days):  (for surgery only (10/31/22)) Behavior/Cognition: Alert;Confused;Requires cueing;Uncooperative Oral Cavity Assessment: Other (comment) (patient did not allow for examination) Oral Care Completed by SLP: No Oral Cavity - Dentition: Adequate natural dentition Vision: Functional for self-feeding Self-Feeding Abilities: Able to feed self Patient Positioning: Upright in bed Baseline Vocal Quality: Normal Volitional Cough: Cognitively unable to elicit Volitional Swallow: Unable to elicit    Oral/Motor/Sensory Function Overall Oral Motor/Sensory Function: Within functional limits   Ice Chips     Thin Liquid Thin Liquid: Impaired Presentation: Cup;Self Fed;Straw Pharyngeal  Phase Impairments: Cough - Delayed    Nectar Thick Nectar Thick Liquid: Not tested   Honey Thick Honey Thick Liquid: Not tested   Puree Puree: Not tested   Solid     Solid: Not tested      Angela Nevin, MA, CCC-SLP Speech Therapy

## 2022-10-31 NOTE — Progress Notes (Signed)
Progress Note  Patient Name: [name] Date of Encounter: 10/31/2022 Primary Cardiologist: Peter Swaz[place], MD   Subjective   Had 8/24 orthopedic surgery.  Post operatively had increased heart rates. Patient notes that she would never, under any circumstance, want another surgery or procedure.  She has asked to not see me or speech therapist again.  Limited orientation.  Vital Signs    Vitals:   10/30/22 2300 10/31/22 0000 10/31/22 0100 10/31/22 0509  BP: 105/83 108/66 109/73 (!) 113/90  Pulse:    84  Resp: 17 20 15    Temp:    97.8 F (36.6 C)  TempSrc:    Oral  SpO2:    98%  Weight:      Height:        Intake/Output Summary (Last 24 hours) at 10/31/2022 0748 Last data filed at 10/31/2022 0600 Gross per 24 hour  Intake 1632.7 ml  Output 300 ml  Net 1332.7 ml   Filed Weights   10/28/22 1702  Weight: 54.4 kg    Physical Exam   GEN: No acute distress.   Neck: No JVD Cardiac: IRIR  systolic murmur  Limited exam- patient asks that we move her up in the bed and then both I and speech provider leave   Labs   Telemetry: AF rate controlled, rare V pacing   Chemistry Recent Labs  Lab 10/28/22 1710 10/28/22 1949 10/29/22 0503 10/30/22 1153  NA 137  --  137 139  K 4.3  --  3.9 4.1  CL 110  --  109 108  CO2 21*  --  18* 20*  GLUCOSE 103*  --  96 105*  BUN 32*  --  28* 29*  CREATININE 1.04* 0.86 0.89 0.86  CALCIUM 8.7*  --  8.9 8.9  PROT  --   --  6.3*  --   ALBUMIN  --   --  3.5  --   AST  --   --  21  --   ALT  --   --  14  --   ALKPHOS  --   --  67  --   BILITOT  --   --  0.9  --   GFRNONAA 52* >60 >60 >60  ANIONGAP 6  --  10 11     Hematology Recent Labs  Lab 10/28/22 1949 10/29/22 0503 10/30/22 1153  WBC 13.4* 11.8* 16.9*  RBC 4.74 4.35 4.54  HGB 13.5 12.1 12.8  HCT 42.0 38.8 41.5  MCV 88.6 89.2 91.4  MCH 28.5 27.8 28.2  MCHC 32.1 31.2 30.8  RDW 16.0* 16.3* 16.5*  PLT 228 194 188    Cardiac EnzymesNo results for input(s):  "TROPONINI" in the last 168 hours. No results for input(s): "TROPIPOC" in the last 168 hours.   BNPNo results for input(s): "BNP", "PROBNP" in the last 168 hours.   DDimer No results for input(s): "DDIMER" in the last 168 hours.   Cardiac Studies   Cardiac Studies & Procedures       ECHOCARDIOGRAM  ECHOCARDIOGRAM COMPLETE 09/06/2022  Narrative ECHOCARDIOGRAM REPORT    Patient Name:   [name] Date of Exam: 09/06/2022 Medical Rec #:  [id number]       Height:       68.0 in Accession #:    [id number]      Weight:       127.6 lb Date of Birth:  1932-09-04        BSA:  1.689 m Patient Age:    48 years        BP:           104/66 mmHg Patient Gender: F               HR:           109 bpm. Exam Location:  Inpatient  Procedure: 2D Echo  Indications:    NICM  History:        Patient has prior history of Echocardiogram examinations, most recent 10/09/2020. CHF; Arrythmias:Atrial Fibrillation.  Sonographer:    Darlys Gales Referring Phys: [id number] Perlie Gold  IMPRESSIONS   1. Left ventricular ejection fraction, by estimation, is 25 to 30%. The left ventricle has severely decreased function. The left ventricle demonstrates global hypokinesis. Left ventricular diastolic function could not be evaluated. 2. Right ventricular systolic function is mildly reduced. The right ventricular size is normal. There is severely elevated pulmonary artery systolic pressure. The estimated right ventricular systolic pressure is 65.7 mmHg. 3. Left atrial size was moderately dilated. 4. Right atrial size was mildly dilated. 5. A small pericardial effusion is present. The pericardial effusion is circumferential. 6. The mitral valve is abnormal. Moderate mitral valve regurgitation. 7. The tricuspid valve is abnormal. Tricuspid valve regurgitation is severe. 8. The aortic valve is tricuspid. Aortic valve regurgitation is not visualized. Aortic valve sclerosis is present, with no evidence of  aortic valve stenosis. 9. The pulmonic valve was abnormal. 10. The inferior vena cava is dilated in size with <50% respiratory variability, suggesting right atrial pressure of 15 mmHg.  Comparison(s): Changes from prior study are noted. 10/09/2020: LVEF 45-50%.  FINDINGS Left Ventricle: Left ventricular ejection fraction, by estimation, is 25 to 30%. The left ventricle has severely decreased function. The left ventricle demonstrates global hypokinesis. The left ventricular internal cavity size was normal in size. There is no left ventricular hypertrophy. Abnormal (paradoxical) septal motion, consistent with RV pacemaker. Left ventricular diastolic function could not be evaluated due to atrial fibrillation. Left ventricular diastolic function could not be evaluated.  Right Ventricle: The right ventricular size is normal. No increase in right ventricular wall thickness. Right ventricular systolic function is mildly reduced. There is severely elevated pulmonary artery systolic pressure. The tricuspid regurgitant velocity is 3.56 m/s, and with an assumed right atrial pressure of 15 mmHg, the estimated right ventricular systolic pressure is 65.7 mmHg.  Left Atrium: Left atrial size was moderately dilated.  Right Atrium: Right atrial size was mildly dilated.  Pericardium: A small pericardial effusion is present. The pericardial effusion is circumferential.  Mitral Valve: The mitral valve is abnormal. There is mild calcification of the posterior mitral valve leaflet(s). Mild to moderate mitral annular calcification. Moderate mitral valve regurgitation.  Tricuspid Valve: The tricuspid valve is abnormal. Tricuspid valve regurgitation is severe.  Aortic Valve: The aortic valve is tricuspid. Aortic valve regurgitation is not visualized. Aortic valve sclerosis is present, with no evidence of aortic valve stenosis. Aortic valve mean gradient measures 3.0 mmHg. Aortic valve peak gradient measures 5.3 mmHg.  Aortic valve area, by VTI measures 1.75 cm.  Pulmonic Valve: The pulmonic valve was abnormal. Pulmonic valve regurgitation is mild.  Aorta: The aortic root and ascending aorta are structurally normal, with no evidence of dilitation.  Venous: The inferior vena cava is dilated in size with less than 50% respiratory variability, suggesting right atrial pressure of 15 mmHg.  IAS/Shunts: No atrial level shunt detected by color flow Doppler.  Additional Comments: A device  lead is visualized.   LEFT VENTRICLE PLAX 2D LVIDd:         4.90 cm   Diastology LVIDs:         4.10 cm   LV e' medial:    6.53 cm/s LV PW:         0.80 cm   LV E/e' medial:  15.2 LV IVS:        0.80 cm   LV e' lateral:   10.30 cm/s LVOT diam:     1.70 cm   LV E/e' lateral: 9.6 LV SV:         27 LV SV Index:   16 LVOT Area:     2.27 cm   RIGHT VENTRICLE            IVC RV S prime:     8.27 cm/s  IVC diam: 3.70 cm TAPSE (M-mode): 1.0 cm  LEFT ATRIUM             Index        RIGHT ATRIUM           Index LA Vol (A2C):   54.7 ml 32.39 ml/m  RA Area:     20.30 cm LA Vol (A4C):   68.2 ml 40.39 ml/m  RA Volume:   56.20 ml  33.28 ml/m LA Biplane Vol: 61.0 ml 36.12 ml/m AORTIC VALVE AV Area (Vmax):    1.61 cm AV Area (Vmean):   1.35 cm AV Area (VTI):     1.75 cm AV Vmax:           115.00 cm/s AV Vmean:          87.600 cm/s AV VTI:            0.156 m AV Peak Grad:      5.3 mmHg AV Mean Grad:      3.0 mmHg LVOT Vmax:         81.40 cm/s LVOT Vmean:        52.100 cm/s LVOT VTI:          0.120 m LVOT/AV VTI ratio: 0.77  AORTA Ao Root diam: 3.00 cm Ao Asc diam:  3.30 cm  MITRAL VALVE               TRICUSPID VALVE MV Area (PHT): 6.32 cm    TR Peak grad:   50.7 mmHg MV Decel Time: 120 msec    TR Vmax:        356.00 cm/s MV E velocity: 99.00 cm/s SHUNTS Systemic VTI:  0.12 m Systemic Diam: 1.70 cm  [name] Shutter MD Electronically signed by [name] Shutter MD Signature Date/Time: 09/06/2022/5:11:50  PM    Final              Assessment & Plan   Paroxysmal Atrial Fibrillation  - In the past, patient had side effects with amiodarone and was adamant that she does not want to take it again; given hypotension, limited options.  She was placed on IV amiodarone for rate control - continue for one more day, hopefully can stop without PO amiodarone transition -continue digoxin 0.125 and metoprolol succinate 12.5 mg daily  - CHADS-VASc 6. Not planned for [facility] return due to multiple falls; for now ASA as per surgery   Chronic, severe, combined heart failure Moderate Mitral Valve Regurgitation  Severe Tricuspid Valve regurgitation  - complicated by frailty - cannot tolerate GDMT in the setting of symptomatic hypotension on midodrine - IVC dilated 3.7  cm; she is 1 L positive since then  - would discharge on at least low level PO lasix   PPM in place - Previously had ICD, but device was downgraded to PPM    Hypotension  - On midodrine 5 mg TID   Long term GOC conversations may be warranted   Otherwise per primary  - Underweight  - Fall at home, right femoral fracture - Lueukocytes in urine   For questions or updates, please contact [facility] Please consult [email or web address] for contact info under Cardiology/STEMI.      Riley Lam, MD FASE [facility] Cardiologist [facility]  [address] [place], [address] [place], [place] [address] [phone]  7:48 AM

## 2022-10-31 NOTE — Progress Notes (Signed)
[facility] Progress Note Patient: [name] ZOX:[id number] DOB: Jun 26, 1932 DOA: 10/28/2022  DOS: the patient was seen and examined on 10/31/2022  Brief hospital course: PMH of A-fib, chronic HFrEF, Barrett's esophagus, HTN, kyphoplasty, BiV pacemaker plan, hypothyroidism, GERD. Presented to the hospital with complaints of mechanical fall with injury to right hip. Found to have right femur fracture. Orthopedic consulted. Cardiology was consulted for preop clearance. 8/24 underwent right IM nailing.  Developed RVR requiring transfer to progressive on IV amiodarone.  Assessment and Plan: Right intertrochanteric hip fracture. Orthopedic consulted. Underwent right IM nailing on 8/24. Developed RVR postoperatively. Monitor for now.  Appreciate cardiology consultation. Per orthopedics okay to resume Xarelto.  But has a patient on aspirin twice daily for DVT prophylaxis. Refusing heparin subcu.  Chronic HFrEF. Moderate MR. Severe TR. Paroxysmal A-fib.  Now with RVR Preop risk evaluation. Appreciate cardiology consultation for preop evaluation. While the patient is a high risk for surgical candidacy, there is no prohibitive risk per cardiology. EF 25%. On digoxin which I will continue.  Digoxin level adequate. On metoprolol which I will hold due to hypotension. Developed RVR did not respond to IV Lopressor and now on IV amiodarone infusion. Given her recurrent fall no plans to return to Xarelto on discharge.  This was stopped last admission but patient continued to take it prior to this admission.  Will have further conversation with the patient and the family with regards to goals.  Hypotension chronic. On midodrine.  Continue.  Unable to tolerate metoprolol for now.  BiV PPM implant. Monitor for now.  Severe malnutrition. Body mass index is 18.25 kg/m.  Will have dietitian follow the patient.   Delirium. Likely postop. Monitor for now. Xanax as needed.    Subjective: No nausea no vomiting no fever no chills.  No chest pain.  Mentation much better compared to yesterday.  Physical Exam: General: in Mild distress, No Rash Cardiovascular: S1 and S2 Present, No Murmur Respiratory: Good respiratory effort, Bilateral Air entry present. No Crackles, No wheezes Abdomen: Bowel Sound present, No tenderness Extremities: No edema Neuro: Alert and oriented x3, no new focal deficit  Data Reviewed: I have Reviewed nursing notes, Vitals, and Lab results. Since last encounter, pertinent lab results CBC and BMP   . I have ordered test including CBC and BMP  .   Disposition: Status is: Inpatient Remains inpatient appropriate because: Need for stability of heart rate  heparin injection 5,000 Units Start: 10/31/22 2200 SCDs Start: 10/30/22 1038 Place TED hose Start: 10/30/22 1038 SCDs Start: 10/28/22 1912   Family Communication: No one at bedside Level of care: Progressive   Vitals:   10/31/22 0509 10/31/22 0800 10/31/22 1205 10/31/22 1207  BP: (!) 113/90 108/68    Pulse: 84     Resp:  (!) 22    Temp: 97.8 F (36.6 C)  97.9 F (36.6 C) 97.9 F (36.6 C)  TempSrc: Oral  Oral Oral  SpO2: 98%     Weight:      Height:         Author: Lynden Oxford, MD 10/31/2022 6:05 PM  Please look on [email or web address] to find out who is on call.

## 2022-10-31 NOTE — Evaluation (Signed)
Physical Therapy Evaluation Patient Details Name: [name] MRN: [id number] DOB: 06/17/1932 Today's Date: 10/31/2022  History of Present Illness  Pt is an 89 year old female who presented to the hospital with complaints of mechanical fall with injury to right hip and s/p right IM nailing on 8/24.  Pt with post op RVR and required IV amiodarone. PMH of A-fib, chronic HFrEF, Barrett's esophagus, HTN, kyphoplasty, BiV pacemaker plan, hypothyroidism, GERD.  Clinical Impression  Patient is s/p above surgery resulting in functional limitations due to the deficits listed below (see PT Problem List).  Patient will benefit from acute skilled PT to increase their independence and safety with mobility to facilitate discharge.  Pt requiring max-total +2 assist for bed mobility and mod +2 assist to stand.  Pt not able to take steps or march in place today.  Pt admitted from SNF (rehab) and plans to return to SNF per TOC notes.  Pt also presents with cognitive deficits including memory issues (does not appear to recall her situation or surgery, states she got OOB twice yesterday with "people" but not stating who).  Patient will benefit from continued inpatient follow up therapy, <3 hours/day.    If plan is discharge home, recommend the following: A lot of help with walking and/or transfers;A lot of help with bathing/dressing/bathroom   Can travel by private vehicle   No    Equipment Recommendations None recommended by PT  Recommendations for Other Services       Functional Status Assessment Patient has had a recent decline in their functional status and demonstrates the ability to make significant improvements in function in a reasonable and predictable amount of time.     Precautions / Restrictions Precautions Precautions: Fall Restrictions Other Position/Activity Restrictions: WBAT in orders and op note, however PWB in MD note from 8/25?      Mobility  Bed Mobility Overal bed mobility:  Needs Assistance Bed Mobility: Supine to Sit, Sit to Supine     Supine to sit: Max assist, +2 for physical assistance, Used rails Sit to supine: Total assist, +2 for physical assistance   General bed mobility comments: multimodal cues for technique, required increased assist due to weakness and pain    Transfers Overall transfer level: Needs assistance Equipment used: Rolling walker (2 wheels) Transfers: Sit to/from Stand Sit to Stand: Mod assist, From elevated surface, +2 safety/equipment           General transfer comment: multimodal cues for positioning and weight shifting; assist to rise and control descent; performed twice; pt fatigued quickly; pt not able to side step up [facility] or march in place    Ambulation/Gait                  Stairs            Wheelchair Mobility     Tilt Bed    Modified Rankin (Stroke Patients Only)       Balance Overall balance assessment: History of Falls                                           Pertinent Vitals/Pain Pain Assessment Pain Assessment: Faces Faces Pain Scale: Hurts even more Pain Location: right hip Pain Descriptors / Indicators: Discomfort, Grimacing Pain Intervention(s): Repositioned, Monitored during session    Home Living Family/patient expects to be discharged to:: Skilled nursing facility  Prior Function Prior Level of Function : Needs assist             Mobility Comments: ambulatory without DME, 2 falls in last month - per previous hospitalization one month ago, pt admitted from SNF rehab       Extremity/Trunk Assessment        Lower Extremity Assessment Lower Extremity Assessment: Generalized weakness;RLE deficits/detail RLE Deficits / Details: requiring physical assist due to pain, anticipated post op weakness observed       Communication   Communication Communication: No apparent difficulties  Cognition Arousal:  Alert Behavior During Therapy: WFL for tasks assessed/performed Overall Cognitive Status: No family/caregiver present to determine baseline cognitive functioning                                 General Comments: impaired cognition observed, not oriented to place or date, not able to describe PLOF, increased confusion since last admission per [facility] note        General Comments      Exercises     Assessment/Plan    PT Assessment Patient needs continued PT services  PT Problem List Decreased strength;Decreased activity tolerance;Decreased mobility;Decreased balance;Decreased safety awareness;Decreased knowledge of use of DME;Decreased cognition;Pain       PT Treatment Interventions Gait training;DME instruction;Balance training;Functional mobility training;Therapeutic activities;Therapeutic exercise;Wheelchair mobility training;Patient/family education    PT Goals (Current goals can be found in the Care Plan section)  Acute Rehab PT Goals PT Goal Formulation: Patient unable to participate in goal setting Time For Goal Achievement: 11/14/22 Potential to Achieve Goals: Good    Frequency Min 1X/week     Co-evaluation               AM-PAC PT "6 Clicks" Mobility  Outcome Measure Help needed turning from your back to your side while in a flat bed without using bedrails?: A Lot Help needed moving from lying on your back to sitting on the side of a flat bed without using bedrails?: A Lot Help needed moving to and from a bed to a chair (including a wheelchair)?: A Lot Help needed standing up from a chair using your arms (e.g., wheelchair or bedside chair)?: A Lot Help needed to walk in hospital room?: A Lot Help needed climbing 3-5 steps with a railing? : Total 6 Click Score: 11    End of Session Equipment Utilized During Treatment: Gait belt Activity Tolerance: Patient tolerated treatment well Patient left: in bed;with call bell/phone within reach;with bed  alarm set   PT Visit Diagnosis: Difficulty in walking, not elsewhere classified (R26.2);History of falling (Z91.81)    Time: 0160-1093 PT Time Calculation (min) (ACUTE ONLY): 13 min   Charges:   PT Evaluation $PT Eval Low Complexity: 1 Low   PT General Charges $$ ACUTE PT VISIT: 1 Visit        Thomasene Mohair PT, DPT Physical Therapist Acute Rehabilitation Services Office: [phone]   [name] 10/31/2022, 12:44 PM

## 2022-10-31 NOTE — Progress Notes (Signed)
Patient ID: [name], female   DOB: January 07, 1933, 89 y.o.   MRN: [id number] Subjective: 1 Day Post-Op Procedure(s) (LRB): INTRAMEDULLARY (IM) NAIL INTERTROCHANTERIC (Right)    Patient reports being comfortable.  No events overnight bu transferred to telemetry yesterday due to a-fib Still in afib this am but rate controlled in th 90s  Objective:   VITALS:   Vitals:   10/31/22 0100 10/31/22 0509  BP: 109/73 (!) 113/90  Pulse:  84  Resp: 15   Temp:  97.8 F (36.6 C)  SpO2:  98%    Neurovascular intact Incision: dressing C/D/I - right hip  LABS Recent Labs    10/28/22 1949 10/29/22 0503 10/30/22 1153  HGB 13.5 12.1 12.8  HCT 42.0 38.8 41.5  WBC 13.4* 11.8* 16.9*  PLT 228 194 188    Recent Labs    10/28/22 1710 10/28/22 1949 10/29/22 0503 10/30/22 1153  NA 137  --  137 139  K 4.3  --  3.9 4.1  BUN 32*  --  28* 29*  CREATININE 1.04* 0.86 0.89 0.86  GLUCOSE 103*  --  96 105*    Recent Labs    10/29/22 0512  INR 1.3*     Assessment/Plan: 1 Day Post-Op Procedure(s) (LRB): INTRAMEDULLARY (IM) NAIL INTERTROCHANTERIC (Right)   Advance diet Up with therapy Continue medical management PWB RLE Resume Xarelto - chronic use and for DVT prophylaxis Pain control ordered but would limit use to minimize side effects

## 2022-11-01 ENCOUNTER — Encounter (HOSPITAL_COMMUNITY): Payer: Self-pay | Admitting: Orthopedic Surgery

## 2022-11-01 ENCOUNTER — Inpatient Hospital Stay (HOSPITAL_COMMUNITY): Payer: Medicare Other

## 2022-11-01 ENCOUNTER — Telehealth: Payer: Self-pay | Admitting: Cardiology

## 2022-11-01 DIAGNOSIS — I480 Paroxysmal atrial fibrillation: Secondary | ICD-10-CM | POA: Diagnosis not present

## 2022-11-01 DIAGNOSIS — Y92009 Unspecified place in unspecified non-institutional (private) residence as the place of occurrence of the external cause: Secondary | ICD-10-CM

## 2022-11-01 DIAGNOSIS — S72024A Nondisplaced fracture of epiphysis (separation) (upper) of right femur, initial encounter for closed fracture: Secondary | ICD-10-CM | POA: Diagnosis not present

## 2022-11-01 DIAGNOSIS — W19XXXA Unspecified fall, initial encounter: Secondary | ICD-10-CM

## 2022-11-01 LAB — CBC
HCT: 33.3 % — ABNORMAL LOW (ref 36.0–46.0)
Hemoglobin: 10.7 g/dL — ABNORMAL LOW (ref 12.0–15.0)
MCH: 28.6 pg (ref 26.0–34.0)
MCHC: 32.1 g/dL (ref 30.0–36.0)
MCV: 89 fL (ref 80.0–100.0)
Platelets: 169 10*3/uL (ref 150–400)
RBC: 3.74 MIL/uL — ABNORMAL LOW (ref 3.87–5.11)
RDW: 16.7 % — ABNORMAL HIGH (ref 11.5–15.5)
WBC: 10.5 10*3/uL (ref 4.0–10.5)
nRBC: 0 % (ref 0.0–0.2)

## 2022-11-01 LAB — BASIC METABOLIC PANEL
Anion gap: 6 (ref 5–15)
BUN: 44 mg/dL — ABNORMAL HIGH (ref 8–23)
CO2: 22 mmol/L (ref 22–32)
Calcium: 8.3 mg/dL — ABNORMAL LOW (ref 8.9–10.3)
Chloride: 107 mmol/L (ref 98–111)
Creatinine, Ser: 1 mg/dL (ref 0.44–1.00)
GFR, Estimated: 54 mL/min — ABNORMAL LOW (ref >60)
Glucose, Bld: 84 mg/dL (ref 70–99)
Potassium: 4.2 mmol/L (ref 3.5–5.1)
Sodium: 135 mmol/L (ref 135–145)

## 2022-11-01 MED ORDER — AMIODARONE HCL 200 MG PO TABS: 200.0000 mg | ORAL_TABLET | Freq: Every day | ORAL | Status: DC

## 2022-11-01 MED ORDER — AMIODARONE HCL 200 MG PO TABS
400.0000 mg | ORAL_TABLET | Freq: Two times a day (BID) | ORAL | Status: DC
  Administered 2022-11-01 – 2022-11-03 (×5): 400 mg via ORAL
  Filled 2022-11-01 (×5): qty 2

## 2022-11-01 MED ORDER — METOPROLOL SUCCINATE ER 25 MG PO TB24
12.5000 mg | ORAL_TABLET | Freq: Two times a day (BID) | ORAL | Status: DC
  Administered 2022-11-01 – 2022-11-03 (×5): 12.5 mg via ORAL
  Filled 2022-11-01 (×5): qty 1

## 2022-11-01 NOTE — Progress Notes (Signed)
Patient ID: [name], female   DOB: May 29, 1932, 89 y.o.   MRN: [id number] Subjective: 2 Days Post-Op Procedure(s) (LRB): INTRAMEDULLARY (IM) NAIL INTERTROCHANTERIC (Right)    Patient reports pain as mild to moderate Limited activity with PT due to requisite assistance with her mobility  Objective:   VITALS:   Vitals:   10/31/22 1207 10/31/22 2100  BP:  118/65  Pulse:  85  Resp:    Temp: 97.9 F (36.6 C) 98.2 F (36.8 C)  SpO2:  100%    Incision: dressing C/D/I  LABS Recent Labs    10/30/22 1153 10/31/22 1419 11/01/22 0423  HGB 12.8 11.2* 10.7*  HCT 41.5 35.1* 33.3*  WBC 16.9* 14.4* 10.5  PLT 188 179 169    Recent Labs    10/30/22 1153 10/31/22 1419 11/01/22 0423  NA 139 136 135  K 4.1 4.3 4.2  BUN 29* 39* 44*  CREATININE 0.86 1.03* 1.00  GLUCOSE 105* 117* 84    No results for input(s): "LABPT", "INR" in the last 72 hours.   Assessment/Plan: 2 Days Post-Op Procedure(s) (LRB): INTRAMEDULLARY (IM) NAIL INTERTROCHANTERIC (Right)   Up with therapy Disposition per PT recommendations DVT prophylaxis, chronic Xarelto use reported currently on heparin due to afib PWB RLE RTC in 2 weeks

## 2022-11-01 NOTE — Care Management Important Message (Signed)
Important Message  Patient Details IM Letter given. Name: JACQUELIN KOLLMAR MRN: [id number] Date of Birth: 1932-11-13   Medicare Important Message Given:  Yes     Caren Macadam 11/01/2022, 2:35 PM

## 2022-11-01 NOTE — Progress Notes (Signed)
Speech Language Pathology Treatment: Dysphagia  Patient Details Name: [name] MRN: [id number] DOB: [birth year] Today's Date: 11/01/2022 Time: 4098-1191 SLP Time Calculation (min) (ACUTE ONLY): 15 min  Assessment / Plan / Recommendation Clinical Impression  Patient seen by SLP for skilled treatment focused on dysphagia goals. Patient was more receptive to interacting than she was previous date but continues to be seem suspicious and aside from taking a few sips of juice, she generally declines to have PO's when SLP in room. No overt s/s aspiration observed with limited PO's (thin liquids). SLP spoke with patient's RN who reported that patient was coughing a little when drinking water this morning, but she did not have any coughing or observed swallowing difficulty with her meal and fed herself. SLP will attempt to see patient one last time to ensure she is on the least restrictive oral diet.     HPI HPI: Patient is an 89 y.o. female with PMH: frequent falls, anxiety disorder, GERD, Barrett's esophagus, erosive gastritis, kyphosis, HTN, a-fib, depressive disorder. She presented to the hospital on 10/28/22 from her SNF following unwitnessed fall. ER workup revealed right femoral fracture and she had surgery yesterday(intramedullary nail intertrochanteric of right hip). Of note, she was admitted in June of 2024 following fall and at that time, she lived alone. SLP swallow evaluation ordered as patient observed to be coughing after sips of water. (this was ordered prior to surgery)      SLP Plan  Continue with current plan of care      Recommendations for follow up therapy are one component of a multi-disciplinary discharge planning process, led by the attending physician.  Recommendations may be updated based on patient status, additional functional criteria and insurance authorization.    Recommendations  Diet recommendations: Dysphagia 3 (mechanical soft);Thin liquid Liquids provided via:  Cup;Straw Medication Administration: Other (Comment) Supervision: Patient able to self feed;Intermittent supervision to cue for compensatory strategies Compensations: Minimize environmental distractions;Slow rate;Small sips/bites Postural Changes and/or Swallow Maneuvers: Seated upright 90 degrees                  Oral care BID   Frequent or constant Supervision/Assistance Dysphagia, unspecified (R13.10)     Continue with current plan of care    Angela Nevin, MA, CCC-SLP Speech Therapy

## 2022-11-01 NOTE — [place] FL2 (Signed)
[place] MEDICAID FL2 LEVEL OF CARE FORM     IDENTIFICATION  Patient Name: [name] Birthdate: 1932-04-13 Sex: female Admission Date (Current Location): 10/28/2022  [facility] and [place] Number:  Producer, television/film/video and Address:  [facility],  [address] [place], [place] [address]      Provider Number: [id number]  Attending Physician Name and Address:  Rolly Salter, MD  Relative Name and Phone Number:  Michaelle Birks [phone])  [phone] [facility])    Current Level of Care: Hospital Recommended Level of Care: Skilled Nursing Facility Prior Approval Number:    Date Approved/Denied:   PASRR Number: [id number]  Discharge Plan: SNF    Current Diagnoses: Patient Active Problem List   Diagnosis Date Noted   Femoral fracture (HCC) 10/28/2022   Underweight 10/28/2022   Leukocytes in urine 10/28/2022   Protein-calorie malnutrition, severe 09/06/2022   UTI (urinary tract infection) 09/05/2022   Anxiety and depression 09/05/2022   Falls frequently 09/05/2022   Recurrent falls 09/05/2022   Fracture 04/09/2021   Fall at home, initial encounter 04/08/2021   Chronic systolic CHF (congestive heart failure) (HCC) 04/08/2021   Pubic ramus fracture (HCC) 04/07/2021   Moderate episode of recurrent major depressive disorder (HCC) 03/23/2021   Non-pressure chronic ulcer of left ankle with fat layer exposed (HCC) 01/01/2021   Persistent atrial fibrillation (HCC)    Acute on chronic congestive heart failure (HCC) 04/28/2020   Incisional hernia 09/07/2018   Coronary atherosclerosis due to calcified coronary lesion of native artery 12/09/2017   Anticoagulated 11/07/2015   Lumbago 08/20/2015   Kyphosis 08/20/2015   Insomnia 08/20/2015   Aortic atherosclerosis (HCC) 08/20/2015   Abdominal bruit 08/20/2015   Aortic ectasia (HCC) 08/20/2015   AAA (abdominal aortic aneurysm) without rupture (HCC) 08/20/2015   B12 deficiency 05/21/2015   HTN (hypertension) 11/19/2014    Cardiomyopathy, dilated, nonischemic (HCC) 07/18/2014   Biventricular cardiac pacemaker in situ 04/24/2014   Chronic cough 04/17/2014   Aneurysm of iliac artery (HCC) 04/17/2014   Incisional hernia, without obstruction or gangrene 11/21/2013   Frequency of urination 11/21/2013   Malaise and fatigue 05/08/2013   Raynaud disease 05/08/2013   Moderate malnutrition (HCC) 12/24/2012   Hypothyroidism    Hair thinning    Anxiety disorder    Hyperlipidemia    Sinusitis, chronic    Chronic bronchitis (HCC)    Depression    Restless legs syndrome (RLS)    PVC (premature ventricular contraction) 06/08/2011   GERD 02/03/2009   Acute on chronic systolic CHF (congestive heart failure) (HCC) 12/12/2008   BARRETT'S ESOPHAGUS 05/29/2008   Gastritis and gastroduodenitis 05/29/2008    Orientation RESPIRATION BLADDER Height & Weight     Self  O2 Incontinent, External catheter Weight: 54.4 kg Height:  5\' 8"  (172.7 cm)  BEHAVIORAL SYMPTOMS/MOOD NEUROLOGICAL BOWEL NUTRITION STATUS      Incontinent Diet (Dysphagia 3)  AMBULATORY STATUS COMMUNICATION OF NEEDS Skin   Extensive Assist   Surgical wounds, Other (Comment) (Right Hip Fx surgical incision; right hand skin tear/foam dressing prn)                       Personal Care Assistance Level of Assistance  Bathing, Feeding, Dressing Bathing Assistance: Maximum assistance Feeding assistance: Maximum assistance Dressing Assistance: Maximum assistance     Functional Limitations Info  Sight, Hearing, Speech Sight Info: Adequate Hearing Info: Adequate Speech Info: Adequate    SPECIAL CARE FACTORS FREQUENCY  PT (By licensed PT), OT (By  licensed OT)     PT Frequency: 5x/wk OT Frequency: 5x/wk            Contractures Contractures Info: Not present    Additional Factors Info  Code Status, Allergies, Psychotropic Code Status Info: DNR Allergies Info: Ciprofloxacin, Hydrocodone, Levofloxacin, Moxifloxacin, Cephalexin, Doxycycline,  Escitalopram, Ofloxacin, Sertraline, Sulfamethoxazole, Tape, Latex, Neomycin Psychotropic Info: N/A         Current Medications (11/01/2022):  This is the current hospital active medication list Current Facility-Administered Medications  Medication Dose Route Frequency Provider Last Rate Last Admin   acetaminophen (TYLENOL) tablet 500 mg  500 mg Oral TID Rolly Salter, MD   500 mg at 10/31/22 2107   acetaminophen (TYLENOL) tablet 650 mg  650 mg Oral Q6H PRN Rolly Salter, MD       ALPRAZolam Prudy Feeler) tablet 0.25 mg  0.25 mg Oral BID PRN Rolly Salter, MD   0.25 mg at 10/31/22 2202   amiodarone (NEXTERONE PREMIX) 360-4.14 MG/200ML-% (1.8 mg/mL) IV infusion  30 mg/hr Intravenous Continuous Rolly Salter, MD 16.67 mL/hr at 11/01/22 0848 30 mg/hr at 11/01/22 0848   bisacodyl (DULCOLAX) suppository 10 mg  10 mg Rectal Daily PRN Cassandria Anger, PA-C       calcium carbonate (OS-CAL - dosed in mg of elemental calcium) tablet 1,250 mg  1 tablet Oral BID WC Rosalene Billings R, PA-C   1,250 mg at 10/31/22 1708   cyanocobalamin (VITAMIN B12) tablet 1,000 mcg  1,000 mcg Oral Daily Cassandria Anger, PA-C   1,000 mcg at 10/31/22 1610   digoxin (LANOXIN) tablet 0.125 mg  0.125 mg Oral Daily Cassandria Anger, PA-C   0.125 mg at 10/31/22 9604   docusate sodium (COLACE) capsule 100 mg  100 mg Oral BID Cassandria Anger, PA-C   100 mg at 10/31/22 2107   feeding supplement (ENSURE ENLIVE / ENSURE PLUS) liquid 237 mL  237 mL Oral BID BM Cassandria Anger, PA-C   237 mL at 10/31/22 0814   heparin injection 5,000 Units  5,000 Units Subcutaneous Q8H Rolly Salter, MD   5,000 Units at 11/01/22 0542   HYDROmorphone (DILAUDID) injection 0.5 mg  0.5 mg Intravenous Q4H PRN Rolly Salter, MD       levothyroxine (SYNTHROID) tablet 100 mcg  100 mcg Oral Q0600 Cassandria Anger, PA-C   100 mcg at 11/01/22 0542   menthol-cetylpyridinium (CEPACOL) lozenge 3 mg  1 lozenge Oral PRN Cassandria Anger, PA-C       Or    phenol (CHLORASEPTIC) mouth spray 1 spray  1 spray Mouth/Throat PRN Cassandria Anger, PA-C       methocarbamol (ROBAXIN) tablet 500 mg  500 mg Oral Q6H PRN Cassandria Anger, PA-C       metoprolol succinate (TOPROL-XL) 24 hr tablet 12.5 mg  12.5 mg Oral BID Rolly Salter, MD       midodrine (PROAMATINE) tablet 5 mg  5 mg Oral TID WC Rosalene Billings R, PA-C   5 mg at 10/31/22 1708   multivitamin with minerals tablet 1 tablet  1 tablet Oral Daily Cassandria Anger, PA-C   1 tablet at 10/31/22 5409   naphazoline-glycerin (CLEAR EYES REDNESS) ophth solution 1-2 drop  1-2 drop Both Eyes QID PRN Cassandria Anger, PA-C       ondansetron [facility]) tablet 4 mg  4 mg Oral Q6H PRN Cassandria Anger, PA-C       Or  ondansetron (ZOFRAN) injection 4 mg  4 mg Intravenous Q6H PRN Cassandria Anger, PA-C       Oral care mouth rinse  15 mL Mouth Rinse PRN Cassandria Anger, PA-C       oxyCODONE (Oxy IR/ROXICODONE) immediate release tablet 5 mg  5 mg Oral Q4H PRN Rolly Salter, MD   5 mg at 10/31/22 2202   pantoprazole (PROTONIX) EC tablet 20 mg  20 mg Oral Daily Cassandria Anger, PA-C   20 mg at 10/31/22 2440   polyethylene glycol (MIRALAX / GLYCOLAX) packet 17 g  17 g Oral BID Cassandria Anger, PA-C   17 g at 10/30/22 2105   senna-docusate (Senokot-S) tablet 2 tablet  2 tablet Oral BID Cassandria Anger, PA-C   2 tablet at 10/31/22 2107   sodium chloride flush (NS) 0.9 % injection 3 mL  3 mL Intravenous Q12H Cassandria Anger, PA-C   3 mL at 10/31/22 2108     Discharge Medications: Please see discharge summary for a list of discharge medications.  Relevant Imaging Results:  Relevant Lab Results:   Additional Information SSN [id number]  Howell Rucks, RN

## 2022-11-01 NOTE — Telephone Encounter (Signed)
Marissa calling from [place] and wants to know who is working at [employer] and also [employer] for the pacemaker. Please advise

## 2022-11-01 NOTE — Plan of Care (Signed)
  Problem: Health Behavior/Discharge Planning: Goal: Ability to manage health-related needs will improve Outcome: Not Progressing   Problem: Clinical Measurements: Goal: Ability to maintain clinical measurements within normal limits will improve Outcome: Not Progressing   

## 2022-11-01 NOTE — Telephone Encounter (Signed)
Spoke to staff who states patient is on monitor and reports pacemaker not pacing. Spoke to Joey from [employer] who is going to check pacemaker shortly. Staff made aware.   Pt admitted at [facility] for fall; Hip FX

## 2022-11-01 NOTE — Progress Notes (Addendum)
Progress Note  Patient Name: [name] Date of Encounter: 11/01/2022 Primary Cardiologist: Peter Swaziland, MD   Subjective   Had 8/24 orthopedic surgery.  Post operatively had increased heart rates. She is not having symptoms.  Vital Signs    Vitals:   11/01/22 0800 11/01/22 0823 11/01/22 0833 11/01/22 0924  BP: 117/71 (!) 142/77 112/85 119/69  Pulse:    99  Resp: 19     Temp:      TempSrc:      SpO2:      Weight:      Height:        Intake/Output Summary (Last 24 hours) at 11/01/2022 1231 Last data filed at 11/01/2022 1212 Gross per 24 hour  Intake 976.09 ml  Output 300 ml  Net 676.09 ml   Filed Weights   10/28/22 1702  Weight: 54.4 kg    Physical Exam   GEN: No acute distress.   Neck: No JVD Cardiac: IRIR   Pulm: nl wob, no wheezes Ext: no LE edema   Labs   Telemetry: AF rate controlled, intermittent V pacing. IVCD. No VT   Chemistry Recent Labs  Lab 10/29/22 0503 10/30/22 1153 10/31/22 1419 11/01/22 0423  NA 137 139 136 135  K 3.9 4.1 4.3 4.2  CL 109 108 106 107  CO2 18* 20* 20* 22  GLUCOSE 96 105* 117* 84  BUN 28* 29* 39* 44*  CREATININE 0.89 0.86 1.03* 1.00  CALCIUM 8.9 8.9 8.6* 8.3*  PROT 6.3*  --   --   --   ALBUMIN 3.5  --   --   --   AST 21  --   --   --   ALT 14  --   --   --   ALKPHOS 67  --   --   --   BILITOT 0.9  --   --   --   GFRNONAA >60 >60 52* 54*  ANIONGAP 10 11 10 6      Hematology Recent Labs  Lab 10/30/22 1153 10/31/22 1419 11/01/22 0423  WBC 16.9* 14.4* 10.5  RBC 4.54 3.96 3.74*  HGB 12.8 11.2* 10.7*  HCT 41.5 35.1* 33.3*  MCV 91.4 88.6 89.0  MCH 28.2 28.3 28.6  MCHC 30.8 31.9 32.1  RDW 16.5* 16.4* 16.7*  PLT 188 179 169    Cardiac EnzymesNo results for input(s): "TROPONINI" in the last 168 hours. No results for input(s): "TROPIPOC" in the last 168 hours.   BNPNo results for input(s): "BNP", "PROBNP" in the last 168 hours.   DDimer No results for input(s): "DDIMER" in the last 168 hours.    Cardiac Studies   Cardiac Studies & Procedures       ECHOCARDIOGRAM  ECHOCARDIOGRAM COMPLETE 09/06/2022  Narrative ECHOCARDIOGRAM REPORT    Patient Name:   [name] Date of Exam: 09/06/2022 Medical Rec #:  [id number]       Height:       68.0 in Accession #:    [id number]      Weight:       127.6 lb Date of Birth:  05/22/1932        BSA:          1.689 m Patient Age:    89 years        BP:           104/66 mmHg Patient Gender: F               HR:  109 bpm. Exam Location:  Inpatient  Procedure: 2D Echo  Indications:    NICM  History:        Patient has prior history of Echocardiogram examinations, most recent 10/09/2020. CHF; Arrythmias:Atrial Fibrillation.  Sonographer:    Darlys Gales Referring Phys: [id number] Perlie Gold  IMPRESSIONS   1. Left ventricular ejection fraction, by estimation, is 25 to 30%. The left ventricle has severely decreased function. The left ventricle demonstrates global hypokinesis. Left ventricular diastolic function could not be evaluated. 2. Right ventricular systolic function is mildly reduced. The right ventricular size is normal. There is severely elevated pulmonary artery systolic pressure. The estimated right ventricular systolic pressure is 65.7 mmHg. 3. Left atrial size was moderately dilated. 4. Right atrial size was mildly dilated. 5. A small pericardial effusion is present. The pericardial effusion is circumferential. 6. The mitral valve is abnormal. Moderate mitral valve regurgitation. 7. The tricuspid valve is abnormal. Tricuspid valve regurgitation is severe. 8. The aortic valve is tricuspid. Aortic valve regurgitation is not visualized. Aortic valve sclerosis is present, with no evidence of aortic valve stenosis. 9. The pulmonic valve was abnormal. 10. The inferior vena cava is dilated in size with <50% respiratory variability, suggesting right atrial pressure of 15 mmHg.  Comparison(s): Changes from prior study are  noted. 10/09/2020: LVEF 45-50%.  FINDINGS Left Ventricle: Left ventricular ejection fraction, by estimation, is 25 to 30%. The left ventricle has severely decreased function. The left ventricle demonstrates global hypokinesis. The left ventricular internal cavity size was normal in size. There is no left ventricular hypertrophy. Abnormal (paradoxical) septal motion, consistent with RV pacemaker. Left ventricular diastolic function could not be evaluated due to atrial fibrillation. Left ventricular diastolic function could not be evaluated.  Right Ventricle: The right ventricular size is normal. No increase in right ventricular wall thickness. Right ventricular systolic function is mildly reduced. There is severely elevated pulmonary artery systolic pressure. The tricuspid regurgitant velocity is 3.56 m/s, and with an assumed right atrial pressure of 15 mmHg, the estimated right ventricular systolic pressure is 65.7 mmHg.  Left Atrium: Left atrial size was moderately dilated.  Right Atrium: Right atrial size was mildly dilated.  Pericardium: A small pericardial effusion is present. The pericardial effusion is circumferential.  Mitral Valve: The mitral valve is abnormal. There is mild calcification of the posterior mitral valve leaflet(s). Mild to moderate mitral annular calcification. Moderate mitral valve regurgitation.  Tricuspid Valve: The tricuspid valve is abnormal. Tricuspid valve regurgitation is severe.  Aortic Valve: The aortic valve is tricuspid. Aortic valve regurgitation is not visualized. Aortic valve sclerosis is present, with no evidence of aortic valve stenosis. Aortic valve mean gradient measures 3.0 mmHg. Aortic valve peak gradient measures 5.3 mmHg. Aortic valve area, by VTI measures 1.75 cm.  Pulmonic Valve: The pulmonic valve was abnormal. Pulmonic valve regurgitation is mild.  Aorta: The aortic root and ascending aorta are structurally normal, with no evidence of  dilitation.  Venous: The inferior vena cava is dilated in size with less than 50% respiratory variability, suggesting right atrial pressure of 15 mmHg.  IAS/Shunts: No atrial level shunt detected by color flow Doppler.  Additional Comments: A device lead is visualized.   LEFT VENTRICLE PLAX 2D LVIDd:         4.90 cm   Diastology LVIDs:         4.10 cm   LV e' medial:    6.53 cm/s LV PW:         0.80 cm  LV E/e' medial:  15.2 LV IVS:        0.80 cm   LV e' lateral:   10.30 cm/s LVOT diam:     1.70 cm   LV E/e' lateral: 9.6 LV SV:         27 LV SV Index:   16 LVOT Area:     2.27 cm   RIGHT VENTRICLE            IVC RV S prime:     8.27 cm/s  IVC diam: 3.70 cm TAPSE (M-mode): 1.0 cm  LEFT ATRIUM             Index        RIGHT ATRIUM           Index LA Vol (A2C):   54.7 ml 32.39 ml/m  RA Area:     20.30 cm LA Vol (A4C):   68.2 ml 40.39 ml/m  RA Volume:   56.20 ml  33.28 ml/m LA Biplane Vol: 61.0 ml 36.12 ml/m AORTIC VALVE AV Area (Vmax):    1.61 cm AV Area (Vmean):   1.35 cm AV Area (VTI):     1.75 cm AV Vmax:           115.00 cm/s AV Vmean:          87.600 cm/s AV VTI:            0.156 m AV Peak Grad:      5.3 mmHg AV Mean Grad:      3.0 mmHg LVOT Vmax:         81.40 cm/s LVOT Vmean:        52.100 cm/s LVOT VTI:          0.120 m LVOT/AV VTI ratio: 0.77  AORTA Ao Root diam: 3.00 cm Ao Asc diam:  3.30 cm  MITRAL VALVE               TRICUSPID VALVE MV Area (PHT): 6.32 cm    TR Peak grad:   50.7 mmHg MV Decel Time: 120 msec    TR Vmax:        356.00 cm/s MV E velocity: 99.00 cm/s SHUNTS Systemic VTI:  0.12 m Systemic Diam: 1.70 cm  Zoila Shutter MD Electronically signed by Zoila Shutter MD Signature Date/Time: 09/06/2022/5:11:50 PM    Final              Assessment & Plan   Paroxysmal Atrial Fibrillation  - She has had hypotension, rate control challenging -she is on IV amiodarone, will transition her to PO amiodarone. I told her she is on it  already, and it is controlling her rates. Can continue this. (She's had concern for side effects in the past) Unfortunately, no other good options for her. -continue digoxin 0.125 and metoprolol succinate 12.5 mg daily  - CHADS-VASc 6. Not planned for [facility] return due to multiple falls; for now ASA as per surgery   Chronic, severe, combined heart failure Moderate Mitral Valve Regurgitation  Severe Tricuspid Valve regurgitation  - complicated by frailty - cannot tolerate GDMT in the setting of symptomatic hypotension on midodrine - euvolemic   CRT-P - Previously had ICD, but device was downgraded to PPM  - has underling afib rates above base rate, Bi-V pacing not optimal - device was interrogated, no issues with capture/thresholds   Hypotension  - On midodrine 5 mg TID    Otherwise per primary  - Underweight  - Fall at  home, right femoral fracture - Leukocytes in urine   Overall, her capacity for decision making is questionable. May help with getting palliative care to help with her GOC. She has refused medications. Otherwise, no other anticipated plans from a cardiology perspective. Will follow peripherally, if rates remain controlled, we will sign off.  For questions or updates, please contact [facility] Please consult [email or web address] for contact info under Cardiology/STEMI.      Maisie Fus

## 2022-11-01 NOTE — Plan of Care (Signed)
  Problem: Pain Managment: Goal: General experience of comfort will improve Outcome: Progressing   Problem: Safety: Goal: Ability to remain free from injury will improve Outcome: Progressing   

## 2022-11-01 NOTE — Progress Notes (Signed)
[facility] Progress Note Patient: [name] ZOX:[id number] DOB: 05-Aug-1932 DOA: 10/28/2022  DOS: the patient was seen and examined on 11/01/2022  Brief hospital course: PMH of A-fib, chronic HFrEF, Barrett's esophagus, HTN, kyphoplasty, BiV pacemaker plan, hypothyroidism, GERD. Presented to the hospital with complaints of mechanical fall with injury to right hip. Found to have right femur fracture. Orthopedic consulted. Cardiology was consulted for preop clearance. 8/24 underwent right IM nailing.  Developed RVR requiring transfer to progressive on IV amiodarone.  Assessment and Plan: Right intertrochanteric hip fracture. Orthopedic consulted. Underwent right IM nailing on 8/24. Developed RVR postoperatively. Monitor for now.  Appreciate cardiology consultation. Per orthopedics okay to resume Xarelto.  But has a patient on aspirin twice daily for DVT prophylaxis. Initiating heparin subcu.  Chronic HFrEF. Moderate MR. Severe TR. Paroxysmal A-fib.  Now with RVR Preop risk evaluation. Appreciate cardiology consultation for preop evaluation. While the patient is a high risk for surgical candidacy, there is no prohibitive risk per cardiology. EF 25%. On digoxin which I will continue.  Digoxin level adequate. On metoprolol which I will hold due to hypotension. Developed RVR did not respond to IV Lopressor and now on IV amiodarone infusion. Given her recurrent fall no plans to return to Xarelto on discharge.  This was stopped last admission but patient continued to take it prior to this admission.  Will have further conversation with the patient and the family with regards to goals.  Palliative care consulted.  Hypotension chronic. On midodrine.  Continue.  Unable to tolerate metoprolol for now.  BiV PPM implant. Monitor for now.  Severe malnutrition. Body mass index is 18.25 kg/m.  Will have dietitian follow the patient.   Delirium. Likely postop. Monitor for now.   Not consistently cooperating with treatment. Xanax as needed.   Subjective: No nausea no vomiting no fever no chills.  Wants to see Dr. Swaziland.  Physical Exam: General: in moderate distress, No Rash Cardiovascular: S1 and S2 Present, No Murmur Respiratory: Good respiratory effort, Bilateral Air entry present.  Basal crackles, No wheezes Abdomen: Bowel Sound present, No tenderness Extremities: No edema Neuro: Alert and oriented x3, no new focal deficit  Data Reviewed: I have Reviewed nursing notes, Vitals, and Lab results. Since last encounter, pertinent lab results CBC and BMP   . I have ordered test including CBC and BMP  .   Disposition: Status is: Inpatient Remains inpatient appropriate because: Needing some more clarity on goals of care and stabilization.  heparin injection 5,000 Units Start: 10/31/22 2200 SCDs Start: 10/30/22 1038 Place TED hose Start: 10/30/22 1038 SCDs Start: 10/28/22 1912   Family Communication: No one at bedside Level of care: Progressive   Vitals:   11/01/22 0823 11/01/22 0833 11/01/22 0924 11/01/22 1300  BP: (!) 142/77 112/85 119/69   Pulse:   99 99  Resp:      Temp:    97.8 F (36.6 C)  TempSrc:    Oral  SpO2:    100%  Weight:      Height:         Author: Lynden Oxford, MD 11/01/2022 7:06 PM  Please look on [email or web address] to find out who is on call.

## 2022-11-01 NOTE — TOC Progression Note (Addendum)
Transition of Care [facility]) - Progression Note    Patient Details  Name: [name] MRN: [id number] Date of Birth: [birth year]  [facility]) CM/SW Contact  Howell Rucks, RN Phone Number: 11/01/2022, 2:43 PM  Clinical Narrative:  DC plan is to return to short term rehab at [place], [facility] completed. Expected dc date unknown. TOC will continue to follow.     Expected Discharge Plan: Skilled Nursing Facility Barriers to Discharge: Continued Medical Work up, English as a second language teacher  Expected Discharge Plan and Services In-house Referral: Clinical Social Work     Living arrangements for the past 2 months: Single Family Home (prior to SNF admission 09/10/22)                 DME Arranged: N/A DME Agency: NA                   Social Determinants of Health (SDOH) Interventions SDOH Screenings   Food Insecurity: No Food Insecurity (10/29/2022)  Housing: Low Risk  (10/29/2022)  Transportation Needs: No Transportation Needs (10/29/2022)  Utilities: Not At Risk (10/29/2022)  Recent Concern: Utilities - At Risk (09/05/2022)  Alcohol Screen: Low Risk  (04/20/2018)  Depression (PHQ2-9): Low Risk  (12/09/2021)  Tobacco Use: Medium Risk (10/29/2022)    Readmission Risk Interventions    10/29/2022    2:39 PM  Readmission Risk Prevention Plan  Transportation Screening Complete  PCP or Specialist Appt within 5-7 Days Complete  Home Care Screening Complete  Medication Review (RN CM) Complete

## 2022-11-02 DIAGNOSIS — Z515 Encounter for palliative care: Secondary | ICD-10-CM | POA: Diagnosis not present

## 2022-11-02 DIAGNOSIS — R296 Repeated falls: Secondary | ICD-10-CM | POA: Diagnosis not present

## 2022-11-02 DIAGNOSIS — Z7189 Other specified counseling: Secondary | ICD-10-CM | POA: Diagnosis not present

## 2022-11-02 DIAGNOSIS — Y92009 Unspecified place in unspecified non-institutional (private) residence as the place of occurrence of the external cause: Secondary | ICD-10-CM | POA: Diagnosis not present

## 2022-11-02 DIAGNOSIS — S72024A Nondisplaced fracture of epiphysis (separation) (upper) of right femur, initial encounter for closed fracture: Secondary | ICD-10-CM | POA: Diagnosis not present

## 2022-11-02 DIAGNOSIS — W19XXXA Unspecified fall, initial encounter: Secondary | ICD-10-CM | POA: Diagnosis not present

## 2022-11-02 DIAGNOSIS — I480 Paroxysmal atrial fibrillation: Secondary | ICD-10-CM | POA: Diagnosis not present

## 2022-11-02 LAB — BASIC METABOLIC PANEL
Anion gap: 11 (ref 5–15)
BUN: 28 mg/dL — ABNORMAL HIGH (ref 8–23)
CO2: 20 mmol/L — ABNORMAL LOW (ref 22–32)
Calcium: 8.6 mg/dL — ABNORMAL LOW (ref 8.9–10.3)
Chloride: 104 mmol/L (ref 98–111)
Creatinine, Ser: 0.83 mg/dL (ref 0.44–1.00)
GFR, Estimated: >60 mL/min (ref >60)
Glucose, Bld: 84 mg/dL (ref 70–99)
Potassium: 4.2 mmol/L (ref 3.5–5.1)
Sodium: 135 mmol/L (ref 135–145)

## 2022-11-02 LAB — CBC
HCT: 33.6 % — ABNORMAL LOW (ref 36.0–46.0)
Hemoglobin: 10.5 g/dL — ABNORMAL LOW (ref 12.0–15.0)
MCH: 27.7 pg (ref 26.0–34.0)
MCHC: 31.3 g/dL (ref 30.0–36.0)
MCV: 88.7 fL (ref 80.0–100.0)
Platelets: 185 10*3/uL (ref 150–400)
RBC: 3.79 MIL/uL — ABNORMAL LOW (ref 3.87–5.11)
RDW: 16.6 % — ABNORMAL HIGH (ref 11.5–15.5)
WBC: 10.5 10*3/uL (ref 4.0–10.5)
nRBC: 0 % (ref 0.0–0.2)

## 2022-11-02 MED ORDER — METHOCARBAMOL 500 MG PO TABS
500.0000 mg | ORAL_TABLET | Freq: Three times a day (TID) | ORAL | Status: DC
  Administered 2022-11-02 – 2022-11-03 (×3): 500 mg via ORAL
  Filled 2022-11-02 (×3): qty 1

## 2022-11-02 NOTE — Progress Notes (Addendum)
Physical Therapy Treatment Patient Details Name: [name] MRN: [id number] DOB: 09-Jul-1932 Today's Date: 11/02/2022   History of Present Illness Pt is an 89 year old female who presented to the hospital with complaints of mechanical fall with injury to right hip and s/p right IM nailing on 8/24.  Pt with post op RVR and required IV amiodarone. PMH of A-fib, chronic HFrEF, Barrett's esophagus, HTN, kyphoplasty, BiV pacemaker plan, hypothyroidism, GERD.    PT Comments  Pt anxious throughout PT session, pt stated she's being kept here against her will and that she's not had any food in 3 days, also stated she is in a movie. +2 total assist for supine to sit. Attempted sit to stand x 2, once with RW, once with Stedy, pt was unable to come to full upright standing position with +2 assist. Pt limited by pain and anxiety. Noted pt's bed saturated in urine, nursing notified of this and of pt's request for food. Pain medication was requested 1 hour prior to PT session, but was not administered. Pt repositioned for comfort.     If plan is discharge home, recommend the following: Two people to help with walking and/or transfers;A lot of help with bathing/dressing/bathroom;Assistance with cooking/housework;Assist for transportation;Help with stairs or ramp for entrance   Can travel by private vehicle     No  Equipment Recommendations  None recommended by PT    Recommendations for Other Services       Precautions / Restrictions Precautions Precautions: Fall Restrictions Weight Bearing Restrictions: Yes RLE Weight Bearing: Partial weight bearing Other Position/Activity Restrictions: WBAT in orders and op note, however PWB in MD note from 8/25 and 8/26     Mobility  Bed Mobility Overal bed mobility: Needs Assistance Bed Mobility: Supine to Sit, Sit to Supine     Supine to sit: +2 for physical assistance, Used rails, Total assist Sit to supine: Total assist, +2 for physical assistance    General bed mobility comments: multimodal cues for technique, required increased assist due to weakness and pain    Transfers Overall transfer level: Needs assistance Equipment used: Rolling walker (2 wheels) Transfers: Sit to/from Stand Sit to Stand: Max assist, +2 physical assistance, From elevated surface           General transfer comment: Attempted sit to stand x 2 with RW then with Stedy. Pt unable to come to full upright standing position with +2 assist and returned herself to sitting at EOB during attempts to stand. Transfer via Lift Equipment: Stedy  Ambulation/Gait                   Stairs             Wheelchair Mobility     Tilt Bed    Modified Rankin (Stroke Patients Only)       Balance Overall balance assessment: History of Falls                                          Cognition Arousal: Alert Behavior During Therapy: WFL for tasks assessed/performed Overall Cognitive Status: No family/caregiver present to determine baseline cognitive functioning                                 General Comments: oriented to self, location, situation, but not to month/year.  Pt stated she's not had food in 3 days.        Exercises General Exercises - Lower Extremity Ankle Circles/Pumps: AROM, Both, 10 reps, Seated Long Arc Quad: AAROM, Right, 10 reps, Seated, Limitations Long Arc Quad Limitations: limited by pain, only tolerated extension to ~ -35* Heel Slides: AAROM, Right, 10 reps, Supine    General Comments        Pertinent Vitals/Pain Pain Assessment Faces Pain Scale: Hurts whole lot Breathing: occasional labored breathing, short period of hyperventilation Negative Vocalization: occasional moan/groan, low speech, negative/disapproving quality Facial Expression: sad, frightened, frown Body Language: tense, distressed pacing, fidgeting Consolability: distracted or reassured by voice/touch PAINAD Score:  5 Pain Location: right hip with movement Pain Descriptors / Indicators: Discomfort, Grimacing, Moaning Pain Intervention(s): Limited activity within patient's tolerance, Monitored during session, Repositioned, Patient requesting pain meds-RN notified (pain meds were requested by PT prior to session, but not administered)    Home Living                          Prior Function            PT Goals (current goals can now be found in the care plan section) Acute Rehab PT Goals PT Goal Formulation: Patient unable to participate in goal setting Time For Goal Achievement: 11/14/22 Potential to Achieve Goals: Good Progress towards PT goals: Not progressing toward goals - comment (pain, confusion limiting progress)    Frequency    Min 1X/week      PT Plan      Co-evaluation              AM-PAC PT "6 Clicks" Mobility   Outcome Measure  Help needed turning from your back to your side while in a flat bed without using bedrails?: Total Help needed moving from lying on your back to sitting on the side of a flat bed without using bedrails?: Total Help needed moving to and from a bed to a chair (including a wheelchair)?: Total Help needed standing up from a chair using your arms (e.g., wheelchair or bedside chair)?: Total Help needed to walk in hospital room?: Total Help needed climbing 3-5 steps with a railing? : Total 6 Click Score: 6    End of Session Equipment Utilized During Treatment: Gait belt;Oxygen Activity Tolerance: Patient limited by pain Patient left: in bed;with call bell/phone within reach Nurse Communication: Mobility status PT Visit Diagnosis: Difficulty in walking, not elsewhere classified (R26.2);History of falling (Z91.81)     Time: 5638-7564 PT Time Calculation (min) (ACUTE ONLY): 19 min  Charges:    $Therapeutic Activity: 8-22 mins PT General Charges $$ ACUTE PT VISIT: 1 Visit                     Tamala Ser PT  11/02/2022  Acute Rehabilitation Services  Office [phone]

## 2022-11-02 NOTE — Plan of Care (Signed)
  Problem: Safety: Goal: Ability to remain free from injury will improve Outcome: Progressing   Problem: Pain Management: Goal: Pain level will decrease Outcome: Progressing   

## 2022-11-02 NOTE — Consult Note (Signed)
Consultation Note Date: 11/02/2022   Patient Name: [name]  DOB: Jan 01, 1933  MRN: [id number]  Age / Sex: 89 y.o., female  PCP: [name], MD Referring Physician: Rolly Salter, MD  Reason for Consultation: Establishing goals of care  HPI/Patient Profile: 89 y.o. female  with past medical history of hypothyroidism, atrial fibrillation, PPM, CHF, HTN, frequent falls, Barrett's esophagus, diverticulitis, GERD, RLS, anxiety, depression admitted on 10/28/2022 with fall and hip fracture s/p surgical repair. Hospitalization complicated by AF RVR requiring amiodarone infusion and delirium.   Clinical Assessment and Goals of Care: Consult received and chart review completed. I met today with [name]. She is awake and alert. She converses with me. She answers some questions. She has much paranoia in conversation with her and questioning the care she is receiving and even her pills. She is a very poor historian but this does seem to fluctuate. She does not have capacity at this time. Unable to further goals of care conversation with her due to her paranoia.  I called and spoke with HCPOA, [name] ([name] did give me permission to call and speak with him). [name] has known [name] for ~3-4 years now. He talks of knowing a very vibrant woman. Her significant other died years ago and she was in touch with his nephew at one time but otherwise no family. [name] reports that she had a fall ~1 year ago and became somewhat a recluse after this injury and has continued to decline. He has noted significant decline with minimal recovery since her previous fall. He knows that she will not be able to return home and has been looking into long term placement options following rehab. She has had fluctuation issues with her memory and cognition since her previous fall. [name] is hopeful for some level of improvement in her cognition  from her current state once she discharges from the hospital.   I spoke with [name] about goals of care. We discussed declining quality of life and [name] knows that [name] does not want to live this way. We discussed hopes for return to [facility] but it is difficult to know if she will have much improvement. [name] agrees with palliative care to follow with transition to comfort care and hospice when she further declines. He would like to focus on comfort and quality of life and avoid rehospitalization.   All questions/concerns addressed. Emotional support provided.   Primary Decision Maker HCPOA [name]    SUMMARY OF RECOMMENDATIONS   - DNR - Return to [facility] rehab with palliative to follow - Transition to comfort care/hospice when further decline  Code Status/Advance Care Planning: DNR   Symptom Management:  Per attending.   Prognosis:  < 6 months very likely.   Discharge Planning: Skilled Nursing Facility for rehab with Palliative care service follow-up      Primary Diagnoses: Present on Admission:  Femoral fracture (HCC)  Persistent atrial fibrillation (HCC)   I have reviewed the medical record, interviewed the patient and family,  and examined the patient. The following aspects are pertinent.  Past Medical History:  Diagnosis Date   AAA (abdominal aortic aneurysm) (HCC)    Anxiety disorder    Barrett's esophagus 04/2008   Benign neoplasm of colon    Cataract    removed both eyes   Chronic airway obstruction, not elsewhere classified    Chronic HFrEF (heart failure with reduced ejection fraction) (HCC)    Complication of anesthesia    slow to wake up   Depressive disorder, not elsewhere classified    Diverticulitis    Erosive gastritis 08/2004   GERD (gastroesophageal reflux disease)    HTN (hypertension)    pt denies    Hyperplastic polyps of stomach 06/2002   Hypothyroidism    Iliac aneurysm (HCC)    Incisional hernia    Insomnia 08/20/2015    Kyphosis 08/20/2015   LBBB (left bundle branch block)    Melanoma (HCC)    right arm   Mitral regurgitation    Other and unspecified hyperlipidemia    Pacemaker 04/26/2012   PAF (paroxysmal atrial fibrillation) (HCC)    11/2016   Restless legs syndrome (RLS)    Synovial cyst of popliteal space    Unspecified chronic bronchitis (HCC)    Social History   Socioeconomic History   Marital status: Single    Spouse name: Not on file   Number of children: 0   Years of education: Not on file   Highest education level: Not on file  Occupational History   Occupation: Retired    Associate Professor: RETIRED  Tobacco Use   Smoking status: Former    Current packs/day: 0.00    Average packs/day: 1 pack/day for 15.0 years (15.0 ttl pk-yrs)    Types: Cigarettes    Start date: 12/23/1978    Quit date: 12/22/1993    Years since quitting: 28.8   Smokeless tobacco: Never   Tobacco comments:    Does'nt reall year quit   Vaping Use   Vaping status: Never Used  Substance and Sexual Activity   Alcohol use: No    Alcohol/week: 0.0 standard drinks of alcohol   Drug use: No   Sexual activity: Never  Other Topics Concern   Not on file  Social History Narrative   Lives in SNF.    Social Determinants of Health   Financial Resource Strain: Not on file  Food Insecurity: No Food Insecurity (10/29/2022)   Hunger Vital Sign    Worried About Running Out of Food in the Last Year: Never true    Ran Out of Food in the Last Year: Never true  Transportation Needs: No Transportation Needs (10/29/2022)   PRAPARE - Administrator, Civil Service (Medical): No    Lack of Transportation (Non-Medical): No  Physical Activity: Not on file  Stress: Not on file  Social Connections: Not on file   Family History  Problem Relation Age of Onset   Stroke Father    Heart disease Other        maternal side   Colon cancer Paternal Uncle    Breast cancer Maternal Aunt    Colon cancer Cousin    Diabetes Neg Hx     Colon polyps Neg Hx    Rectal cancer Neg Hx    Stomach cancer Neg Hx    Scheduled Meds:  acetaminophen  500 mg Oral TID   amiodarone  400 mg Oral BID   Followed by   Melene Muller ON 11/08/2022] amiodarone  200 mg Oral Daily   calcium carbonate  1 tablet Oral BID WC   vitamin B-12  1,000 mcg Oral Daily   digoxin  0.125 mg Oral Daily   docusate sodium  100 mg Oral BID   feeding supplement  237 mL Oral BID BM   heparin injection (subcutaneous)  5,000 Units Subcutaneous Q8H   levothyroxine  100 mcg Oral Q0600   metoprolol succinate  12.5 mg Oral BID   midodrine  5 mg Oral TID WC   multivitamin with minerals  1 tablet Oral Daily   pantoprazole  20 mg Oral Daily   polyethylene glycol  17 g Oral BID   senna-docusate  2 tablet Oral BID   sodium chloride flush  3 mL Intravenous Q12H   Continuous Infusions: PRN Meds:.acetaminophen, ALPRAZolam, bisacodyl, HYDROmorphone (DILAUDID) injection, menthol-cetylpyridinium **OR** phenol, methocarbamol **OR** [DISCONTINUED] methocarbamol (ROBAXIN) IV, naphazoline-glycerin, ondansetron **OR** ondansetron (ZOFRAN) IV, mouth rinse, oxyCODONE Allergies  Allergen Reactions   Ciprofloxacin Other (See Comments)    Not indicated due to aneurysm in aorta     Hydrocodone Itching   Levofloxacin Other (See Comments)    Not indicated due to aneurysm in aorta    Moxifloxacin Other (See Comments)    Not indicated due to aneurysm in aorta    Cephalexin Itching, Swelling and Other (See Comments)    Edema, too   Doxycycline Other (See Comments)    Unknown reaction   Escitalopram Other (See Comments)    Dry mouth   Ofloxacin Other (See Comments)    "Allergic," per facility   Sertraline Other (See Comments)    Insomnia    Sulfamethoxazole Hives and Other (See Comments)    "Any sulfa meds"   Tape Other (See Comments)    Burns skin.    Latex Rash and Other (See Comments)    "If pt. Makes contact with or wearing"   Neomycin Rash   Review of Systems  Unable to  perform ROS: Other  Constitutional:        Does not cooperate with questioning    Physical Exam Vitals and nursing note reviewed.  Constitutional:      General: She is not in acute distress.    Appearance: She is ill-appearing.  Cardiovascular:     Rate and Rhythm: Normal rate.  Pulmonary:     Effort: No tachypnea, accessory muscle usage or respiratory distress.  Abdominal:     Palpations: Abdomen is soft.  Neurological:     Mental Status: She is alert. She is confused.  Psychiatric:        Behavior: Behavior is agitated.        Cognition and Memory: Cognition is impaired. Memory is impaired.     Comments: Paranoia     Vital Signs: BP 113/66 (BP Location: Left Arm)   Pulse 85   Temp 99.3 F (37.4 C) (Axillary)   Resp 19   Ht 5\' 8"  (1.727 m)   Wt 54.4 kg   SpO2 100%   BMI 18.25 kg/m  Pain Scale: 0-10 POSS *See Group Information*: 1-Acceptable,Awake and alert Pain Score: 4    SpO2: SpO2: 100 % O2 Device:SpO2: 100 % O2 Flow Rate: .O2 Flow Rate (L/min): 4 L/min  IO: Intake/output summary:  Intake/Output Summary (Last 24 hours) at 11/02/2022 1021 Last data filed at 11/02/2022 0453 Gross per 24 hour  Intake 123 ml  Output 1000 ml  Net -877 ml    LBM: Last BM Date : 10/30/22 Baseline Weight: Weight: 54.4  kg Most recent weight: Weight: 54.4 kg     Palliative Assessment/Data:     Time Total: 80 min  Greater than 50%  of this time was spent counseling and coordinating care related to the above assessment and plan.  Signed by: Yong Channel, NP Palliative Medicine Team Pager # [phone] (M-F 8a-5p) Team Phone # [phone] (Nights/Weekends)

## 2022-11-02 NOTE — Plan of Care (Signed)
  Problem: Health Behavior/Discharge Planning: Goal: Ability to manage health-related needs will improve Outcome: Not Progressing   Problem: Clinical Measurements: Goal: Ability to maintain clinical measurements within normal limits will improve Outcome: Not Progressing   

## 2022-11-02 NOTE — Progress Notes (Signed)
[facility] Progress Note Patient: [name] ZOX:[id number] DOB: 29-May-1932 DOA: 10/28/2022  DOS: the patient was seen and examined on 11/02/2022  Brief hospital course: PMH of A-fib, chronic HFrEF, Barrett's esophagus, HTN, kyphoplasty, BiV pacemaker plan, hypothyroidism, GERD. Presented to the hospital with complaints of mechanical fall with injury to right hip. Found to have right femur fracture. Orthopedic consulted. Cardiology was consulted for preop clearance. 8/24 underwent right IM nailing.  Developed RVR requiring transfer to progressive on IV amiodarone.  Assessment and Plan: Right intertrochanteric hip fracture. Orthopedic consulted. Underwent right IM nailing on 8/24. Developed RVR postoperatively. Monitor for now.  Appreciate cardiology consultation. Per orthopedics okay to resume Xarelto.  But has a patient on aspirin twice daily for DVT prophylaxis. Initiating heparin subcu.  Chronic HFrEF. Moderate MR. Severe TR. Paroxysmal A-fib.  Now with RVR Preop risk evaluation. Appreciate cardiology consultation for preop evaluation. While the patient is a high risk for surgical candidacy, there is no prohibitive risk per cardiology. EF 25%. On digoxin which I will continue.  Digoxin level adequate. On metoprolol which I will hold due to hypotension. Developed RVR did not respond to IV Lopressor and now on IV amiodarone infusion. Given her recurrent fall no plans to return to Xarelto on discharge.  This was stopped last admission but patient continued to take it prior to this admission.  Will have further conversation with the patient and the family with regards to goals.  Palliative care consulted.  Hypotension chronic. On midodrine.  Continue.  Unable to tolerate metoprolol for now.  BiV PPM implant. Monitor for now.  Severe malnutrition. Body mass index is 18.25 kg/m.  Appreciate dietitian consultation.  Delirium. Likely postop. Monitor for now.  Not  consistently cooperating with treatment. Xanax as needed.   Subjective: Pain uncontrolled.  No nausea, vomiting or fever no chills.  Physical Exam: General: in Mild distress, No Rash Cardiovascular: S1 and S2 Present, aortic systolic  Murmur Respiratory: Good respiratory effort, Bilateral Air entry present. No Crackles, No wheezes Abdomen: Bowel Sound present, No tenderness Extremities: No edema Neuro: Alert and oriented x3, no new focal deficit  Data Reviewed: I have Reviewed nursing notes, Vitals, and Lab results. Since last encounter, pertinent lab results CBC and BMP   . I have ordered test including CBC  .   Disposition: Status is: Inpatient Remains inpatient appropriate because: Awaiting improvement in mentation, stability of anemia  heparin injection 5,000 Units Start: 10/31/22 2200 SCDs Start: 10/30/22 1038 Place TED hose Start: 10/30/22 1038 SCDs Start: 10/28/22 1912   Family Communication: No one at bedside Level of care: Progressive   Vitals:   11/01/22 2049 11/02/22 0446 11/02/22 1146 11/02/22 1236  BP: 126/63 113/66 (!) 103/59 93/80  Pulse:  85 (!) 110 68  Resp:    17  Temp: 98.1 F (36.7 C) 99.3 F (37.4 C)  97.8 F (36.6 C)  TempSrc: Axillary Axillary  Oral  SpO2:    100%  Weight:      Height:         Author: Lynden Oxford, MD 11/02/2022 7:20 PM  Please look on [email or web address] to find out who is on call.

## 2022-11-03 DIAGNOSIS — W19XXXA Unspecified fall, initial encounter: Secondary | ICD-10-CM | POA: Diagnosis not present

## 2022-11-03 DIAGNOSIS — F331 Major depressive disorder, recurrent, moderate: Secondary | ICD-10-CM | POA: Diagnosis not present

## 2022-11-03 DIAGNOSIS — R2689 Other abnormalities of gait and mobility: Secondary | ICD-10-CM | POA: Diagnosis not present

## 2022-11-03 DIAGNOSIS — L89322 Pressure ulcer of left buttock, stage 2: Secondary | ICD-10-CM | POA: Diagnosis not present

## 2022-11-03 DIAGNOSIS — L89613 Pressure ulcer of right heel, stage 3: Secondary | ICD-10-CM | POA: Diagnosis not present

## 2022-11-03 DIAGNOSIS — F32A Depression, unspecified: Secondary | ICD-10-CM

## 2022-11-03 DIAGNOSIS — I420 Dilated cardiomyopathy: Secondary | ICD-10-CM | POA: Diagnosis not present

## 2022-11-03 DIAGNOSIS — M25551 Pain in right hip: Secondary | ICD-10-CM | POA: Diagnosis not present

## 2022-11-03 DIAGNOSIS — I4819 Other persistent atrial fibrillation: Secondary | ICD-10-CM | POA: Diagnosis not present

## 2022-11-03 DIAGNOSIS — G3184 Mild cognitive impairment, so stated: Secondary | ICD-10-CM | POA: Diagnosis not present

## 2022-11-03 DIAGNOSIS — I10 Essential (primary) hypertension: Secondary | ICD-10-CM | POA: Diagnosis not present

## 2022-11-03 DIAGNOSIS — S72141D Displaced intertrochanteric fracture of right femur, subsequent encounter for closed fracture with routine healing: Secondary | ICD-10-CM | POA: Diagnosis not present

## 2022-11-03 DIAGNOSIS — I5022 Chronic systolic (congestive) heart failure: Secondary | ICD-10-CM | POA: Diagnosis not present

## 2022-11-03 DIAGNOSIS — E039 Hypothyroidism, unspecified: Secondary | ICD-10-CM | POA: Diagnosis not present

## 2022-11-03 DIAGNOSIS — Z7689 Persons encountering health services in other specified circumstances: Secondary | ICD-10-CM | POA: Diagnosis not present

## 2022-11-03 DIAGNOSIS — S72024A Nondisplaced fracture of epiphysis (separation) (upper) of right femur, initial encounter for closed fracture: Secondary | ICD-10-CM | POA: Diagnosis not present

## 2022-11-03 DIAGNOSIS — I959 Hypotension, unspecified: Secondary | ICD-10-CM | POA: Diagnosis not present

## 2022-11-03 DIAGNOSIS — I7300 Raynaud's syndrome without gangrene: Secondary | ICD-10-CM

## 2022-11-03 DIAGNOSIS — E440 Moderate protein-calorie malnutrition: Secondary | ICD-10-CM | POA: Diagnosis not present

## 2022-11-03 DIAGNOSIS — M6281 Muscle weakness (generalized): Secondary | ICD-10-CM | POA: Diagnosis not present

## 2022-11-03 DIAGNOSIS — F419 Anxiety disorder, unspecified: Secondary | ICD-10-CM | POA: Diagnosis not present

## 2022-11-03 DIAGNOSIS — R82998 Other abnormal findings in urine: Secondary | ICD-10-CM

## 2022-11-03 DIAGNOSIS — I482 Chronic atrial fibrillation, unspecified: Secondary | ICD-10-CM | POA: Diagnosis not present

## 2022-11-03 DIAGNOSIS — L89616 Pressure-induced deep tissue damage of right heel: Secondary | ICD-10-CM | POA: Diagnosis not present

## 2022-11-03 DIAGNOSIS — R296 Repeated falls: Secondary | ICD-10-CM | POA: Diagnosis not present

## 2022-11-03 DIAGNOSIS — Z4731 Aftercare following explantation of shoulder joint prosthesis: Secondary | ICD-10-CM | POA: Diagnosis not present

## 2022-11-03 DIAGNOSIS — N390 Urinary tract infection, site not specified: Secondary | ICD-10-CM | POA: Diagnosis not present

## 2022-11-03 DIAGNOSIS — S7291XD Unspecified fracture of right femur, subsequent encounter for closed fracture with routine healing: Secondary | ICD-10-CM | POA: Diagnosis not present

## 2022-11-03 DIAGNOSIS — S81812A Laceration without foreign body, left lower leg, initial encounter: Secondary | ICD-10-CM | POA: Diagnosis not present

## 2022-11-03 DIAGNOSIS — I714 Abdominal aortic aneurysm, without rupture, unspecified: Secondary | ICD-10-CM | POA: Diagnosis not present

## 2022-11-03 DIAGNOSIS — G2581 Restless legs syndrome: Secondary | ICD-10-CM | POA: Diagnosis not present

## 2022-11-03 DIAGNOSIS — M79651 Pain in right thigh: Secondary | ICD-10-CM | POA: Diagnosis not present

## 2022-11-03 DIAGNOSIS — E46 Unspecified protein-calorie malnutrition: Secondary | ICD-10-CM | POA: Diagnosis not present

## 2022-11-03 DIAGNOSIS — E785 Hyperlipidemia, unspecified: Secondary | ICD-10-CM | POA: Diagnosis not present

## 2022-11-03 DIAGNOSIS — R531 Weakness: Secondary | ICD-10-CM | POA: Diagnosis not present

## 2022-11-03 DIAGNOSIS — Y92009 Unspecified place in unspecified non-institutional (private) residence as the place of occurrence of the external cause: Secondary | ICD-10-CM | POA: Diagnosis not present

## 2022-11-03 DIAGNOSIS — F411 Generalized anxiety disorder: Secondary | ICD-10-CM | POA: Diagnosis not present

## 2022-11-03 DIAGNOSIS — G4700 Insomnia, unspecified: Secondary | ICD-10-CM | POA: Diagnosis not present

## 2022-11-03 DIAGNOSIS — Z7401 Bed confinement status: Secondary | ICD-10-CM | POA: Diagnosis not present

## 2022-11-03 DIAGNOSIS — M1990 Unspecified osteoarthritis, unspecified site: Secondary | ICD-10-CM | POA: Diagnosis not present

## 2022-11-03 DIAGNOSIS — K219 Gastro-esophageal reflux disease without esophagitis: Secondary | ICD-10-CM | POA: Diagnosis not present

## 2022-11-03 LAB — COMPREHENSIVE METABOLIC PANEL
ALT: 11 U/L (ref 0–44)
AST: 13 U/L — ABNORMAL LOW (ref 15–41)
Albumin: 3 g/dL — ABNORMAL LOW (ref 3.5–5.0)
Alkaline Phosphatase: 55 U/L (ref 38–126)
Anion gap: 10 (ref 5–15)
BUN: 31 mg/dL — ABNORMAL HIGH (ref 8–23)
CO2: 22 mmol/L (ref 22–32)
Calcium: 8.7 mg/dL — ABNORMAL LOW (ref 8.9–10.3)
Chloride: 105 mmol/L (ref 98–111)
Creatinine, Ser: 0.92 mg/dL (ref 0.44–1.00)
GFR, Estimated: 60 mL/min — ABNORMAL LOW (ref >60)
Glucose, Bld: 99 mg/dL (ref 70–99)
Potassium: 4 mmol/L (ref 3.5–5.1)
Sodium: 137 mmol/L (ref 135–145)
Total Bilirubin: 0.8 mg/dL (ref 0.3–1.2)
Total Protein: 5.9 g/dL — ABNORMAL LOW (ref 6.5–8.1)

## 2022-11-03 LAB — CBC WITH DIFFERENTIAL/PLATELET
Abs Immature Granulocytes: 0.05 10*3/uL (ref 0.00–0.07)
Basophils Absolute: 0 10*3/uL (ref 0.0–0.1)
Basophils Relative: 0 %
Eosinophils Absolute: 0.3 10*3/uL (ref 0.0–0.5)
Eosinophils Relative: 3 %
HCT: 33.2 % — ABNORMAL LOW (ref 36.0–46.0)
Hemoglobin: 10.6 g/dL — ABNORMAL LOW (ref 12.0–15.0)
Immature Granulocytes: 1 %
Lymphocytes Relative: 12 %
Lymphs Abs: 1.2 10*3/uL (ref 0.7–4.0)
MCH: 28 pg (ref 26.0–34.0)
MCHC: 31.9 g/dL (ref 30.0–36.0)
MCV: 87.8 fL (ref 80.0–100.0)
Monocytes Absolute: 0.8 10*3/uL (ref 0.1–1.0)
Monocytes Relative: 8 %
Neutro Abs: 7.8 10*3/uL — ABNORMAL HIGH (ref 1.7–7.7)
Neutrophils Relative %: 76 %
Platelets: 202 10*3/uL (ref 150–400)
RBC: 3.78 MIL/uL — ABNORMAL LOW (ref 3.87–5.11)
RDW: 16.7 % — ABNORMAL HIGH (ref 11.5–15.5)
WBC: 10.2 10*3/uL (ref 4.0–10.5)
nRBC: 0 % (ref 0.0–0.2)

## 2022-11-03 LAB — MAGNESIUM: Magnesium: 2.2 mg/dL (ref 1.7–2.4)

## 2022-11-03 MED ORDER — METOPROLOL SUCCINATE ER 25 MG PO TB24: 25.0000 mg | ORAL_TABLET | Freq: Every day | ORAL | Status: DC

## 2022-11-03 MED ORDER — POLYETHYLENE GLYCOL 3350 17 GM/SCOOP PO POWD: 17.0000 g | Freq: Two times a day (BID) | ORAL | Status: DC | PRN

## 2022-11-03 MED ORDER — QUETIAPINE FUMARATE 25 MG PO TABS: 12.5000 mg | ORAL_TABLET | Freq: Two times a day (BID) | ORAL | Status: DC | PRN

## 2022-11-03 MED ORDER — SENNOSIDES-DOCUSATE SODIUM 8.6-50 MG PO TABS: 1.0000 | ORAL_TABLET | Freq: Two times a day (BID) | ORAL | Status: DC | PRN

## 2022-11-03 MED ORDER — AMIODARONE HCL 200 MG PO TABS: ORAL_TABLET | ORAL | Status: AC

## 2022-11-03 MED ORDER — ASPIRIN 81 MG PO TBEC
81.0000 mg | DELAYED_RELEASE_TABLET | Freq: Two times a day (BID) | ORAL | Status: AC
Start: 2022-11-03 — End: 2022-12-03

## 2022-11-03 NOTE — Plan of Care (Signed)
Do not have further recommendations from a cardiac perspective. Cardiology will sign off. Don't hesitate to reach out for further questions.

## 2022-11-03 NOTE — Discharge Summary (Signed)
Physician Discharge Summary  [name] QMV:[id number] DOB: 1932-12-18 DOA: 10/28/2022  PCP: [name], MD  Admit date: 10/28/2022 Discharge date: 11/03/2022 Admitted From: SNF Disposition: SNF Recommendations for Outpatient Follow-up:  Follow up with orthopedic surgery as below Check CMP and CBC in 1 week. Recommend palliative follow-up at SNF Please follow up on the following pending results: None   Discharge Condition: Stable CODE STATUS: DNR/DNI  Contact information for follow-up providers     Durene Romans, MD. Schedule an appointment as soon as possible for a visit in 2 week(s).   Specialty: Orthopedic Surgery Contact information: [address] [place] [address] [phone]              Contact information for after-discharge care     Destination     [facility]erred [facility] .   Service: Skilled Nursing Contact information: [address] Test Update Address [place] [address] [phone]                     Hospital course PMH of A-fib, chronic HFrEF, BiV pacemaker, AAA, Barrett's esophagus, HTN, kyphoplasty, hypothyroidism, anxiety, depression, RLS and GERD brought to ED after mechanical fall and right hip injury and found to have right femoral fracture for which she underwent intramedullary nailing on 8/24 by Dr. Charlann Boxer.  Hospital course complicated by A-fib with RVR and delirium.  Cardiology was consulted for preoperative clearance and A-fib with RVR, that was treated with amiodarone drip and she was transition to p.o. amiodarone.  She is not a candidate for anticoagulation due to risk of fall and major bleeding.  Delirium improved.  Therapy recommended SNF.  See individual problem list below for more.   Problems addressed during this hospitalization Principal Problem:   Femoral fracture (HCC) Active Problems:   Restless legs syndrome (RLS)   Raynaud disease   Cardiomyopathy, dilated,  nonischemic (HCC)   AAA (abdominal aortic aneurysm) without rupture (HCC)   Persistent atrial fibrillation (HCC)   Fall at home, initial encounter   Anxiety and depression   Underweight   Leukocytes in urine   Mechanical fall at nursing home Right intertrochanteric hip fracture. -S/p IM nailing by Dr. Charlann Boxer on 8/24. -Aspirin 81 mg twice daily for VTE prophylaxis. -Pain control and bowel regimen    Chronic HFrEF/moderate MR/severe TR/NICM: TTE with LVEF of 25%.  Appears euvolemic.  On room air.  Does not seem to be on diuretics prior to arrival. -Outpatient follow-up with cardiology -Continue Toprol-XL, digoxin  Persistent A-fib with RVR: RVR resolved. -Continue p.o. amiodarone 400 mg twice daily for 6 more days followed by 200 mg daily per cardiology -Continue home digoxin and Toprol-XL. -Not a candidate for anticoagulation due to risk of fall or major bleeding   Hypotension: Chronic. -Continue home midodrine   BiV PPM implant. -Per cardiology  Delirium: Likely due to hospital stay. -Reorientation and delirium precaution -Minimize or avoid sedating medications -P.o. Seroquel 12.5 mg twice daily as needed agitation  Severe malnutrition Nutrition Problem: Severe Malnutrition Etiology: chronic illness, lethargy/confusion Signs/Symptoms: severe fat depletion, severe muscle depletion Interventions: Ensure Enlive (each supplement provides 350kcal and 20 grams of protein), MVI, Magic cup     Time spent 45 minutes  Vital signs Vitals:   11/02/22 1236 11/02/22 2111 11/03/22 0418 11/03/22 0855  BP: 93/80 (!) 161/140 106/80 113/74  Pulse: 68 74 85 (!) 104  Temp: 97.8 F (36.6 C) 97.7 F (36.5 C) 98.3 F (36.8 C)  Resp: 17  16   Height:      Weight:      SpO2: 100% 98% 96%   TempSrc: Oral Oral Oral   BMI (Calculated):         Discharge exam  GENERAL: No apparent distress.  Nontoxic. HEENT: MMM.  Vision and hearing grossly intact.  NECK: Supple.  No apparent JVD.   RESP:  No IWOB.  Fair aeration bilaterally. CVS: Irregular rhythm.  Normal rate.  Heart sounds normal.  ABD/GI/GU: BS+. Abd soft, NTND.  MSK/EXT: Moves extremities.  Significant muscle mass and subcu fat loss. SKIN: no apparent skin lesion or wound NEURO: Awake and alert but oriented.  No apparent focal neuro deficit but limited exam. PSYCH: Calm. Normal affect.   Discharge Instructions Discharge Instructions     Diet general   Complete by: As directed    Increase activity slowly   Complete by: As directed    No wound care   Complete by: As directed       Allergies as of 11/03/2022       Reactions   Ciprofloxacin Other (See Comments)   Not indicated due to aneurysm in aorta     Hydrocodone Itching   Levofloxacin Other (See Comments)   Not indicated due to aneurysm in aorta    Moxifloxacin Other (See Comments)   Not indicated due to aneurysm in aorta    Cephalexin Itching, Swelling, Other (See Comments)   Edema, too   Doxycycline Other (See Comments)   Unknown reaction   Escitalopram Other (See Comments)   Dry mouth   Ofloxacin Other (See Comments)   "Allergic," per facility   Sertraline Other (See Comments)   Insomnia    Sulfamethoxazole Hives, Other (See Comments)   "Any sulfa meds"   Tape Other (See Comments)   Burns skin.    Latex Rash, Other (See Comments)   "If pt. Makes contact with or wearing"   Neomycin Rash        Medication List     TAKE these medications    acetaminophen 500 MG tablet Commonly known as: TYLENOL Take 1,000 mg by mouth every 8 (eight) hours as needed (for pain).   amiodarone 200 MG tablet Commonly known as: PACERONE Take 2 tablets (400 mg total) by mouth 2 (two) times daily for 6 days, THEN 1 tablet (200 mg total) daily. Start taking on: November 03, 2022   aspirin EC 81 MG tablet Take 1 tablet (81 mg total) by mouth in the morning and at bedtime. Swallow whole.   CALCIUM + D PO Take 2 tablets by mouth in the morning.    CETYLPYRIDINIUM CHLORIDE MT Use as directed 15 mLs in the mouth or throat daily as needed (as directed).   cyanocobalamin 1000 MCG tablet Commonly known as: VITAMIN B12 Take 1,000 mcg by mouth daily.   Digox 0.125 MG tablet Generic drug: digoxin Take 0.125 mg by mouth daily. What changed: Another medication with the same name was removed. Continue taking this medication, and follow the directions you see here.   feeding supplement Liqd Take 237 mLs by mouth 2 (two) times daily between meals.   levothyroxine 100 MCG tablet Commonly known as: Synthroid Take 1 tablet (100 mcg total) by mouth daily before breakfast. E03.9   megestrol 400 MG/10ML suspension Commonly known as: MEGACE Take 10 mLs (400 mg total) by mouth daily.   metoprolol succinate 25 MG 24 hr tablet Commonly known as: TOPROL-XL Take 1 tablet (25 mg  total) by mouth daily. What changed:  how much to take when to take this   midodrine 5 MG tablet Commonly known as: PROAMATINE Take 5 mg by mouth 3 (three) times daily with meals.   oxyCODONE 5 MG immediate release tablet Commonly known as: Oxy IR/ROXICODONE Take 1 tablet (5 mg total) by mouth every 4 (four) hours as needed for moderate pain or severe pain.   polyethylene glycol powder 17 GM/SCOOP powder Commonly known as: MiraLax Take 17 g by mouth 2 (two) times daily as needed for mild constipation.   QUEtiapine 25 MG tablet Commonly known as: SEROQUEL Take 0.5 tablets (12.5 mg total) by mouth 2 (two) times daily as needed (Agitation).   RABEprazole 20 MG tablet Commonly known as: ACIPHEX Take one tablet by mouth once daily for stomach What changed:  how much to take how to take this when to take this additional instructions   senna-docusate 8.6-50 MG tablet Commonly known as: Senokot-S Take 1 tablet by mouth 2 (two) times daily between meals as needed for mild constipation.   Systane 0.4-0.3 % Soln Generic drug: Polyethyl Glycol-Propyl  Glycol Place 1 drop into both eyes daily as needed (dry eyes).   vitamin C 1000 MG tablet Take 1,000 mg by mouth at bedtime. Ester C        Consultations: Orthopedic surgery Cardiology  Procedures/Studies: 8/24-IM nailing of right hip fracture by Dr. Charlann Boxer.  DG CHEST PORT 1 VIEW  Result Date: 11/01/2022 CLINICAL DATA:  Status post ORIF of the proximal right femur for intertrochanteric fracture. Pacemaker dysfunction. EXAM: PORTABLE CHEST 1 VIEW COMPARISON:  10/28/2022 FINDINGS: Stable mild cardiac enlargement. Appearance of the biventricular pacing/ICD device is stable since prior radiographs without evidence of lead malposition or disruption. There is no evidence of pulmonary edema, consolidation, pneumothorax or pleural fluid. No visualized bony abnormalities. IMPRESSION: Stable appearance of biventricular pacing/ICD device and mild cardiomegaly. No acute findings. Electronically Signed   By: Irish Lack M.D.   On: 11/01/2022 12:17   DG HIP UNILAT WITH PELVIS 1V RIGHT  Result Date: 10/30/2022 CLINICAL DATA:  Elective surgery EXAM: DG HIP (WITH OR WITHOUT PELVIS) 1V RIGHT COMPARISON:  Femur radiograph from 2 days ago FINDINGS: 9 procedural fluoroscopic images show reduction and then femoral nail and dynamic hip screw fixation of an intertrochanteric femur fracture. Remote right obturator ring fractures. No unexpected finding. IMPRESSION: Fluoroscopy for femur fracture fixation.  No unexpected finding. Electronically Signed   By: Tiburcio Pea M.D.   On: 10/30/2022 09:21   DG C-Arm 1-60 Min-No Report  Result Date: 10/30/2022 Fluoroscopy was utilized by the requesting physician.  No radiographic interpretation.   DG FEMUR PORT, MIN 2 VIEWS RIGHT  Result Date: 10/28/2022 CLINICAL DATA:  Additional imaging for right hip pain after fall EXAM: RIGHT FEMUR PORTABLE 2 VIEW COMPARISON:  Radiographs 10/28/2022 FINDINGS: Nondisplaced right intertrochanteric fracture. Remote fracture  deformities about the right inferior superior pubic rami. No distal femur fracture. Right knee osteoarthritis with advanced lateral compartment narrowing. IMPRESSION: Redemonstrated nondisplaced right intertrochanteric fracture. Right knee osteoarthritis with advanced lateral compartment narrowing. Electronically Signed   By: Minerva Fester M.D.   On: 10/28/2022 20:34   DG Hip Unilat W or Wo Pelvis 2-3 Views Right  Result Date: 10/28/2022 CLINICAL DATA:  Status post fall. EXAM: DG HIP (WITH OR WITHOUT PELVIS) 2-3V RIGHT COMPARISON:  September 05, 2022 and April 07, 2021 FINDINGS: There is an acute fracture deformity extending through the inter trochanteric region of the proximal right  femur. Chronic fracture deformities of the right superior and right inferior pubic rami are seen. A 16 mm x 16 mm well-defined cortical opacity is seen overlying the soft tissues adjacent to the lateral aspect of the left hip. This represents a new finding when compared to the April 07, 2021 study and appears to originate from the tip of the left greater trochanter. There is no evidence of dislocation. Degenerative changes seen involving both hips, in the form of joint space narrowing and acetabular sclerosis. Additional degenerative changes are seen within the visualized portion of the mid and lower lumbar spine. IMPRESSION: 1. Acute fracture of the proximal right femur. 2. Chronic fracture deformities of the right superior and right inferior pubic rami. 3. Displaced fracture fragment of indeterminate age which likely originates from the tip of the left greater trochanter. Electronically Signed   By: Aram Candela M.D.   On: 10/28/2022 18:44   DG Chest 2 View  Result Date: 10/28/2022 CLINICAL DATA:  Status post trip and fall. EXAM: CHEST - 2 VIEW COMPARISON:  September 04, 2022 FINDINGS: There is stable multi lead AICD positioning. The heart size and mediastinal contours are within normal limits. Marked severity calcification  and tortuosity thoracic aorta is noted. There is no evidence of an acute infiltrate, pleural effusion or pneumothorax. Radiopaque surgical clips are seen overlying the left axilla. The visualized skeletal structures are unremarkable. IMPRESSION: No active cardiopulmonary disease. Electronically Signed   By: Aram Candela M.D.   On: 10/28/2022 18:37   CT Head Wo Contrast  Result Date: 10/28/2022 CLINICAL DATA:  Fall, hit head on Xarelto EXAM: CT HEAD WITHOUT CONTRAST TECHNIQUE: Contiguous axial images were obtained from the base of the skull through the vertex without intravenous contrast. RADIATION DOSE REDUCTION: This exam was performed according to the departmental dose-optimization program which includes automated exposure control, adjustment of the mA and/or kV according to patient size and/or use of iterative reconstruction technique. COMPARISON:  CT brain 09/05/2022, 04/07/2021 FINDINGS: Brain: No acute territorial infarction, hemorrhage or intracranial mass. Atrophy. Mild chronic small vessel ischemic changes of the white matter. Mildly prominent ventricles but stable in size Vascular: No hyperdense vessels. Vertebral and carotid vascular calcification Skull: Normal. Negative for fracture or focal lesion. Sinuses/Orbits: No acute finding. Other: None IMPRESSION: No CT evidence for acute intracranial abnormality. Atrophy and chronic small vessel ischemic changes of the white matter. Electronically Signed   By: Jasmine Pang M.D.   On: 10/28/2022 18:26       The results of significant diagnostics from this hospitalization (including imaging, microbiology, ancillary and laboratory) are listed below for reference.     Microbiology: Recent Results (from the past 240 hour(s))  Surgical PCR screen     Status: None   Collection Time: 10/29/22  2:54 PM   Specimen: Nasal Mucosa; Nasal Swab  Result Value Ref Range Status   MRSA, PCR NEGATIVE NEGATIVE Final   Staphylococcus aureus NEGATIVE NEGATIVE  Final    Comment: (NOTE) The Xpert SA Assay (FDA approved for NASAL specimens in patients 41 years of age and older), is one component of a comprehensive surveillance program. It is not intended to diagnose infection nor to guide or monitor treatment. Performed at [facility], [address] [place], [place] [address]      Labs:  CBC: Recent Labs  Lab 10/28/22 1710 10/28/22 1949 10/30/22 1153 10/31/22 1419 11/01/22 0423 11/02/22 0401 11/03/22 0407  WBC 10.8*   < > 16.9* 14.4* 10.5 10.5 10.2  NEUTROABS 8.3*  --   --   --   --   --  7.8*  HGB 13.1   < > 12.8 11.2* 10.7* 10.5* 10.6*  HCT 41.5   < > 41.5 35.1* 33.3* 33.6* 33.2*  MCV 88.9   < > 91.4 88.6 89.0 88.7 87.8  PLT 243   < > 188 179 169 185 202   < > = values in this interval not displayed.   BMP &GFR Recent Labs  Lab 10/30/22 1153 10/31/22 1419 11/01/22 0423 11/02/22 0401 11/03/22 0407  NA 139 136 135 135 137  K 4.1 4.3 4.2 4.2 4.0  CL 108 106 107 104 105  CO2 20* 20* 22 20* 22  GLUCOSE 105* 117* 84 84 99  BUN 29* 39* 44* 28* 31*  CREATININE 0.86 1.03* 1.00 0.83 0.92  CALCIUM 8.9 8.6* 8.3* 8.6* 8.7*  MG 2.2  --   --   --  2.2   Estimated Creatinine Clearance: 36.3 mL/min (by C-G formula based on SCr of 0.92 mg/dL). Liver & Pancreas: Recent Labs  Lab 10/29/22 0503 11/03/22 0407  AST 21 13*  ALT 14 11  ALKPHOS 67 55  BILITOT 0.9 0.8  PROT 6.3* 5.9*  ALBUMIN 3.5 3.0*   No results for input(s): "LIPASE", "AMYLASE" in the last 168 hours. No results for input(s): "AMMONIA" in the last 168 hours. Diabetic: No results for input(s): "HGBA1C" in the last 72 hours. No results for input(s): "GLUCAP" in the last 168 hours. Cardiac Enzymes: No results for input(s): "CKTOTAL", "CKMB", "CKMBINDEX", "TROPONINI" in the last 168 hours. No results for input(s): "PROBNP" in the last 8760 hours. Coagulation Profile: Recent Labs  Lab 10/29/22 0512  INR 1.3*   Thyroid Function Tests: No  results for input(s): "TSH", "T4TOTAL", "FREET4", "T3FREE", "THYROIDAB" in the last 72 hours. Lipid Profile: No results for input(s): "CHOL", "HDL", "LDLCALC", "TRIG", "CHOLHDL", "LDLDIRECT" in the last 72 hours. Anemia Panel: No results for input(s): "VITAMINB12", "FOLATE", "FERRITIN", "TIBC", "IRON", "RETICCTPCT" in the last 72 hours. Urine analysis:    Component Value Date/Time   COLORURINE YELLOW 10/28/2022 1928   APPEARANCEUR CLOUDY (A) 10/28/2022 1928   APPEARANCEUR Clear 11/21/2013 1412   LABSPEC 1.017 10/28/2022 1928   PHURINE 5.0 10/28/2022 1928   GLUCOSEU NEGATIVE 10/28/2022 1928   HGBUR NEGATIVE 10/28/2022 1928   BILIRUBINUR NEGATIVE 10/28/2022 1928   BILIRUBINUR negative 12/09/2021 1618   BILIRUBINUR Negative 10/06/2021 1425   BILIRUBINUR Negative 11/21/2013 1412   KETONESUR NEGATIVE 10/28/2022 1928   PROTEINUR 30 (A) 10/28/2022 1928   UROBILINOGEN 0.2 12/09/2021 1618   UROBILINOGEN 0.2 12/31/2012 1321   NITRITE NEGATIVE 10/28/2022 1928   LEUKOCYTESUR LARGE (A) 10/28/2022 1928   Sepsis Labs: Invalid input(s): "PROCALCITONIN", "LACTICIDVEN"   SIGNED:  Almon Hercules, MD  [facility] 11/03/2022, 12:52 PM

## 2022-11-03 NOTE — Consult Note (Signed)
   [facility] CM Inpatient Consult   11/03/2022  PRIYANA TIENDA May 23, 1932 [id number]  [facility] Liaison remote coverage review for patient admitted to [facility]   [facility] [[facility]]  Accountable Care Organization [ACO] Patient: Medicare ACO Reach  Primary Care Provider:  [name], MD   Patient was reviewed for barriers to care for post hospital needs.  Patient to return to [facility].  Patient was screened for hospitalization and on behalf of [facility] Care Coordination to assess for post hospital community care needs.  Patient is being considered for a skilled nursing facility level of care for post hospital transition. Patient to be followed by Palliative Care at [facility].  Plan:   Will notify the Community [facility] RN of transitioning back to [facility] and  can follow for any known or needs for transitional care needs for returning to post facility care coordination needs to return to community.  For questions or referrals, please contact:  Charlesetta Shanks, RN BSN CCM [facility]  [phone] business mobile phone Toll free office [phone]  Fax number: [phone] [email or web address]

## 2022-11-03 NOTE — Plan of Care (Signed)
  Problem: Activity: Goal: Risk for activity intolerance will decrease Outcome: Progressing   Problem: Coping: Goal: Level of anxiety will decrease Outcome: Progressing   Problem: Pain Managment: Goal: General experience of comfort will improve Outcome: Progressing   Problem: Skin Integrity: Goal: Risk for impaired skin integrity will decrease Outcome: Progressing   

## 2022-11-03 NOTE — TOC Transition Note (Addendum)
Transition of Care [facility]) - CM/SW Discharge Note   Patient Details  Name: [name] MRN: [id number] Date of Birth: [birth year]  Transition of [facility] CM/SW Contact:  Howell Rucks, RN Phone Number: 11/03/2022, 10:28 AM   Clinical Narrative:  DC to [facility] for short term rehab. Call to [place], admissions coordinator at [facility], confirmed bed available to today for return RM 603B, Nurse call [phone]. [facility] to follow at [facility] for Palliative. [facility] for transport. No further TOC needs identified.        Barriers to Discharge: Continued Medical Work up, English as a second language teacher   Patient Goals and CMS Choice      Discharge Placement                         Discharge Plan and Services Additional resources added to the After Visit Summary for   In-house Referral: Clinical Social Work              DME Arranged: N/A DME Agency: NA                  Social Determinants of Health (SDOH) Interventions SDOH Screenings   Food Insecurity: No Food Insecurity (10/29/2022)  Housing: Low Risk  (10/29/2022)  Transportation Needs: No Transportation Needs (10/29/2022)  Utilities: Not At Risk (10/29/2022)  Recent Concern: Utilities - At Risk (09/05/2022)  Alcohol Screen: Low Risk  (04/20/2018)  Depression (PHQ2-9): Low Risk  (12/09/2021)  Tobacco Use: Medium Risk (10/29/2022)     Readmission Risk Interventions    10/29/2022    2:39 PM  Readmission Risk Prevention Plan  Transportation Screening Complete  PCP or Specialist Appt within 5-7 Days Complete  Home Care Screening Complete  Medication Review (RN CM) Complete

## 2022-11-04 DIAGNOSIS — E440 Moderate protein-calorie malnutrition: Secondary | ICD-10-CM | POA: Diagnosis not present

## 2022-11-04 DIAGNOSIS — I5022 Chronic systolic (congestive) heart failure: Secondary | ICD-10-CM | POA: Diagnosis not present

## 2022-11-04 DIAGNOSIS — S72141D Displaced intertrochanteric fracture of right femur, subsequent encounter for closed fracture with routine healing: Secondary | ICD-10-CM | POA: Diagnosis not present

## 2022-11-04 DIAGNOSIS — I482 Chronic atrial fibrillation, unspecified: Secondary | ICD-10-CM | POA: Diagnosis not present

## 2022-11-08 NOTE — Progress Notes (Deleted)
Cardiology Clinic Note   Date: 11/08/2022 ID: [name], DOB 1932/12/04, MRN [id number]  Primary Cardiologist:  Peter Swaziland, MD  Patient Profile    [name] is a 89 y.o. female who presents to the clinic today for ***    Past medical history significant for: Nonobstructive coronary artery atherosclerosis. Chronic systolic heart failure/nonischemic cardiomyopathy. BiV ICD placement. GEN change to BiV PPM 01/26/2019. In clinic device check 04/30/2022: Normal device function.  Base rate increased to 60 bpm.  A-fib burden <1%.  BiV pacing 98%. Echo 09/06/2022: EF 25 to 30%.  Global hypokinesis.  Diastolic function cannot be evaluated.  Mildly reduced RV function.  Severely elevated PA pressure.  Moderate LAE.  Moderate RAE.  Small pericardial effusion.  Moderate MR.  Severe TR.  Aortic valve sclerosis without stenosis.  Abnormal pulmonic valve.  Dilated IVC, RA pressure 15 mmHg.  (Prior LVEF 45 to 50% August 2022). PAF. Onset 2018. DCCV 05/06/2020. DCCV with IV amiodarone 05/19/2020. AAA/Aneurysm of iliac artery. AAA duplex 11/13/2020: Abnormal dilatation of the mid and distal abdominal aorta measuring 2.91 x 1.79 cm (unchanged from prior exam March 2021).  Diffuse calcified atherosclerosis throughout the aorta and iliac arteries. Hypertension. Hyperlipidemia. GERD. Hypothyroidism. Frequent falls.     History of Present Illness    [name] is a longtime patient of cardiology.  She is followed by Dr. Swaziland and Dr. Ladona Ridgel for the above outlined history.  In summary, she has a history of cardiomyopathy dating back several decades.  She underwent cardiac catheterization 2006 for LV dysfunction which showed no coronary artery disease.  She underwent BiV ICD insertion with normalization of LVEF.  This was later changed to BiV PPM.  She developed PAF in 2018 and was started on anticoagulation.  In March 2022 she was admitted with recurrent heart failure and a drop in EF to 25 to  30% secondary to A-fib with RVR.  Given her advanced age she was treated medically and no ischemic evaluation was felt to be necessary.  She underwent unsuccessful DCCV, started on amiodarone with successful repeat DCCV.  She underwent hospital admission in January 2023 for pelvic fracture after mechanical fall.  She stopped amiodarone in August 2023 secondary to side effects.  Was last seen in the office by Dr. Swaziland on 02/03/2022 for routine follow-up.  Overall she was stable from a cardiac standpoint and no changes were made.  Patient presented to the ED via EMS from home on 09/04/2022 for a fall.  It was reported she fell hard enough for them to hear.  She reported fall was mechanical with her legs giving out and no loss of consciousness.  UA was suggestive of UTI.  CT revealed scalp hematoma.  She was tachycardic upon arrival to ED. Cardiology was consulted.  Device interrogation revealed 100s of episodes of rapid heart rate with onset 2 days prior which appeared to be A-fib with RVR.  GDMT for cardiomyopathy held secondary to soft BPs.  Anticoagulation held and will be discontinued at discharge secondary to risks outweighing benefits of continuing.  Rate was controlled with digoxin and Toprol.  Patient was discharged to a SNF on 09/10/2022.  Patient presented to the ED on 10/28/2022 via EMS from skilled nursing facility after a fall landing on her right side with complaints of right hip pain.  Patient described at approximately 2 to 3 AM her remits phone was ringing and she got to answer it but it was dark and she lost  her orientation and fell to the floor as she was turning.  She denied dizziness, focal weakness, presyncope prior to fall.  She was held back to bed by nursing home staff.  She was transported to ER at 5 PM with complaints of right hip and entire right thigh pain.  X-ray revealed right femoral fracture and she was admitted. Cardiology consulted for preoperative evaluation.  Patient was found  to be a high risk surgical candidate but not prohibitive for surgical repair of hip fracture. She underwent intramedullary nail i intertrochanter on the right on 10/30/2022.  Cardiology consulted for tachycardia post surgery.  Rate control challenging secondary to hypotension.  She was started on IV amiodarone with plan to transition to p.o. amiodarone.  She was discharged back to SNF on 11/03/2022.  Today, patient ***  Amiodarone 200 mg daily on 9/6?  Chronic systolic heart failure/nonischemic cardiomyopathy.  S/p BiV ICD changed to BiV [facility] November 2020.  Echo July 2024 showed EF 25 to 30% (decreased from 45 to 50% August 2022).  Patient*** Euvolemic and well compensated on exam.  Continue metoprolol.  GDMT difficult secondary to hypotension. PAF.  Onset 2018.  Previously treated with amiodarone which she stopped in August 2023 secondary to side effects.  She underwent hospital admission for mechanical fall June 2024 and was found to be in A-fib with RVR.  Rate difficult to control secondary to hypotension.  She was started on digoxin.  Decision was made to discontinue anticoagulation secondary to frequent falls.  Hospital admission August 2024 for fall resulting in hip fracture.  Patient went back into A-fib with RVR postsurgery and was started on amiodarone.  Patient***.  She is to begin amiodarone 200 mg daily on 11/12/2022. Hypotension. BP today *** Patient*** Continue midodrine.   ROS: All other systems reviewed and are otherwise negative except as noted in History of Present Illness.  Studies Reviewed       ***  Risk Assessment/Calculations    {Does this patient have ATRIAL FIBRILLATION?[phone]} No BP recorded.  {Refresh Note OR Click here to enter BP  :1}***        Physical Exam    VS:  There were no vitals taken for this visit. , BMI There is no height or weight on file to calculate BMI.  GEN: Well nourished, well developed, in no acute distress. Neck: No JVD or carotid  bruits. Cardiac: *** RRR. No murmurs. No rubs or gallops.   Respiratory:  Respirations regular and unlabored. Clear to auscultation without rales, wheezing or rhonchi. GI: Soft, nontender, nondistended. Extremities: Radials/DP/PT 2+ and equal bilaterally. No clubbing or cyanosis. No edema ***  Skin: Warm and dry, no rash. Neuro: Strength intact.  Assessment & Plan   ***  Disposition: ***     {Are you ordering a CV Procedure (e.g. stress test, cath, DCCV, TEE, etc)?   Press F2        :[id number]}   Signed, Etta Grandchild. Vitaly Wanat, DNP, NP-C

## 2022-11-10 ENCOUNTER — Ambulatory Visit: Payer: Medicare Other | Attending: Student | Admitting: Student

## 2022-11-10 DIAGNOSIS — S81812A Laceration without foreign body, left lower leg, initial encounter: Secondary | ICD-10-CM | POA: Diagnosis not present

## 2022-11-10 DIAGNOSIS — L89322 Pressure ulcer of left buttock, stage 2: Secondary | ICD-10-CM | POA: Diagnosis not present

## 2022-11-10 DIAGNOSIS — E785 Hyperlipidemia, unspecified: Secondary | ICD-10-CM | POA: Diagnosis not present

## 2022-11-10 DIAGNOSIS — E039 Hypothyroidism, unspecified: Secondary | ICD-10-CM | POA: Diagnosis not present

## 2022-11-13 ENCOUNTER — Other Ambulatory Visit: Payer: Self-pay | Admitting: Physician Assistant

## 2022-11-15 ENCOUNTER — Other Ambulatory Visit: Payer: Self-pay | Admitting: *Deleted

## 2022-11-15 DIAGNOSIS — Z4731 Aftercare following explantation of shoulder joint prosthesis: Secondary | ICD-10-CM | POA: Diagnosis not present

## 2022-11-15 NOTE — Patient Outreach (Signed)
Post- Acute Care Coordinator follow up. Mrs. Klopfenstein resides in [place] skilled nursing facility.  Screening for potential care coordination services as a benefit of health plan and primary care provider.  Secure communication sent to Dese[place], [employer], for collaboration about transition plans and potential care coordination needs.  Will continue to follow.   Raiford Noble, MSN, RN, BSN [place]  [facility], [facility] RN Post- Acute Care Coordinator Direct Dial: [phone]

## 2022-11-17 DIAGNOSIS — S81812A Laceration without foreign body, left lower leg, initial encounter: Secondary | ICD-10-CM | POA: Diagnosis not present

## 2022-11-17 DIAGNOSIS — L89322 Pressure ulcer of left buttock, stage 2: Secondary | ICD-10-CM | POA: Diagnosis not present

## 2022-11-17 DIAGNOSIS — E785 Hyperlipidemia, unspecified: Secondary | ICD-10-CM | POA: Diagnosis not present

## 2022-11-17 DIAGNOSIS — E039 Hypothyroidism, unspecified: Secondary | ICD-10-CM | POA: Diagnosis not present

## 2022-11-18 DIAGNOSIS — K219 Gastro-esophageal reflux disease without esophagitis: Secondary | ICD-10-CM | POA: Diagnosis not present

## 2022-11-18 DIAGNOSIS — F331 Major depressive disorder, recurrent, moderate: Secondary | ICD-10-CM | POA: Diagnosis not present

## 2022-11-18 DIAGNOSIS — E46 Unspecified protein-calorie malnutrition: Secondary | ICD-10-CM | POA: Diagnosis not present

## 2022-11-18 DIAGNOSIS — F419 Anxiety disorder, unspecified: Secondary | ICD-10-CM | POA: Diagnosis not present

## 2022-11-18 DIAGNOSIS — I10 Essential (primary) hypertension: Secondary | ICD-10-CM | POA: Diagnosis not present

## 2022-11-18 DIAGNOSIS — G4700 Insomnia, unspecified: Secondary | ICD-10-CM | POA: Diagnosis not present

## 2022-11-18 DIAGNOSIS — I5022 Chronic systolic (congestive) heart failure: Secondary | ICD-10-CM | POA: Diagnosis not present

## 2022-11-18 DIAGNOSIS — G2581 Restless legs syndrome: Secondary | ICD-10-CM | POA: Diagnosis not present

## 2022-11-18 DIAGNOSIS — N390 Urinary tract infection, site not specified: Secondary | ICD-10-CM | POA: Diagnosis not present

## 2022-11-18 DIAGNOSIS — E039 Hypothyroidism, unspecified: Secondary | ICD-10-CM | POA: Diagnosis not present

## 2022-11-18 DIAGNOSIS — M1990 Unspecified osteoarthritis, unspecified site: Secondary | ICD-10-CM | POA: Diagnosis not present

## 2022-11-18 DIAGNOSIS — E785 Hyperlipidemia, unspecified: Secondary | ICD-10-CM | POA: Diagnosis not present

## 2022-11-22 ENCOUNTER — Other Ambulatory Visit: Payer: Self-pay | Admitting: *Deleted

## 2022-11-22 NOTE — Patient Outreach (Signed)
[name] resides in [facility] skilled nursing facility. Screening for potential care coordination services as benefit of health plan and Primary Care Provider.  Collaboration with [place], Geophysicist/field seismologist. [name] has become more confused after her recent hip surgery. Initial transition plan has been for [facility]. However, [facility] is coming assess for memory care.   Deseree reports [name] will remain long term care if she doesn't go to ALF.   No identifiable care coordination needs.   Raiford Noble, MSN, RN, BSN   [facility], [facility] RN Post- Acute Care Coordinator Direct Dial: [phone]

## 2022-11-24 DIAGNOSIS — I10 Essential (primary) hypertension: Secondary | ICD-10-CM | POA: Diagnosis not present

## 2022-11-24 DIAGNOSIS — F331 Major depressive disorder, recurrent, moderate: Secondary | ICD-10-CM | POA: Diagnosis not present

## 2022-11-24 DIAGNOSIS — I5022 Chronic systolic (congestive) heart failure: Secondary | ICD-10-CM | POA: Diagnosis not present

## 2022-11-24 DIAGNOSIS — F419 Anxiety disorder, unspecified: Secondary | ICD-10-CM | POA: Diagnosis not present

## 2022-11-24 DIAGNOSIS — I482 Chronic atrial fibrillation, unspecified: Secondary | ICD-10-CM | POA: Diagnosis not present

## 2022-11-24 DIAGNOSIS — E039 Hypothyroidism, unspecified: Secondary | ICD-10-CM | POA: Diagnosis not present

## 2022-11-24 DIAGNOSIS — E440 Moderate protein-calorie malnutrition: Secondary | ICD-10-CM | POA: Diagnosis not present

## 2022-11-24 DIAGNOSIS — Z7689 Persons encountering health services in other specified circumstances: Secondary | ICD-10-CM | POA: Diagnosis not present

## 2022-11-24 DIAGNOSIS — M1990 Unspecified osteoarthritis, unspecified site: Secondary | ICD-10-CM | POA: Diagnosis not present

## 2022-11-26 DIAGNOSIS — E039 Hypothyroidism, unspecified: Secondary | ICD-10-CM | POA: Diagnosis not present

## 2022-11-26 DIAGNOSIS — I10 Essential (primary) hypertension: Secondary | ICD-10-CM | POA: Diagnosis not present

## 2022-11-26 DIAGNOSIS — E440 Moderate protein-calorie malnutrition: Secondary | ICD-10-CM | POA: Diagnosis not present

## 2022-11-26 DIAGNOSIS — M1990 Unspecified osteoarthritis, unspecified site: Secondary | ICD-10-CM | POA: Diagnosis not present

## 2022-11-26 DIAGNOSIS — Z7689 Persons encountering health services in other specified circumstances: Secondary | ICD-10-CM | POA: Diagnosis not present

## 2022-11-26 DIAGNOSIS — F331 Major depressive disorder, recurrent, moderate: Secondary | ICD-10-CM | POA: Diagnosis not present

## 2022-11-26 DIAGNOSIS — I482 Chronic atrial fibrillation, unspecified: Secondary | ICD-10-CM | POA: Diagnosis not present

## 2022-11-26 DIAGNOSIS — N390 Urinary tract infection, site not specified: Secondary | ICD-10-CM | POA: Diagnosis not present

## 2022-11-26 DIAGNOSIS — I5022 Chronic systolic (congestive) heart failure: Secondary | ICD-10-CM | POA: Diagnosis not present

## 2022-12-01 DIAGNOSIS — L89322 Pressure ulcer of left buttock, stage 2: Secondary | ICD-10-CM | POA: Diagnosis not present

## 2022-12-01 DIAGNOSIS — L89616 Pressure-induced deep tissue damage of right heel: Secondary | ICD-10-CM | POA: Diagnosis not present

## 2022-12-01 DIAGNOSIS — E785 Hyperlipidemia, unspecified: Secondary | ICD-10-CM | POA: Diagnosis not present

## 2022-12-01 DIAGNOSIS — E039 Hypothyroidism, unspecified: Secondary | ICD-10-CM | POA: Diagnosis not present

## 2022-12-08 DIAGNOSIS — E785 Hyperlipidemia, unspecified: Secondary | ICD-10-CM | POA: Diagnosis not present

## 2022-12-08 DIAGNOSIS — E039 Hypothyroidism, unspecified: Secondary | ICD-10-CM | POA: Diagnosis not present

## 2022-12-08 DIAGNOSIS — L89322 Pressure ulcer of left buttock, stage 2: Secondary | ICD-10-CM | POA: Diagnosis not present

## 2022-12-08 DIAGNOSIS — L89613 Pressure ulcer of right heel, stage 3: Secondary | ICD-10-CM | POA: Diagnosis not present

## 2022-12-13 ENCOUNTER — Telehealth: Payer: Self-pay | Admitting: Internal Medicine

## 2022-12-13 DIAGNOSIS — M1990 Unspecified osteoarthritis, unspecified site: Secondary | ICD-10-CM | POA: Diagnosis not present

## 2022-12-13 DIAGNOSIS — I5022 Chronic systolic (congestive) heart failure: Secondary | ICD-10-CM | POA: Diagnosis not present

## 2022-12-13 DIAGNOSIS — E039 Hypothyroidism, unspecified: Secondary | ICD-10-CM | POA: Diagnosis not present

## 2022-12-13 DIAGNOSIS — F419 Anxiety disorder, unspecified: Secondary | ICD-10-CM | POA: Diagnosis not present

## 2022-12-13 DIAGNOSIS — E440 Moderate protein-calorie malnutrition: Secondary | ICD-10-CM | POA: Diagnosis not present

## 2022-12-13 DIAGNOSIS — F331 Major depressive disorder, recurrent, moderate: Secondary | ICD-10-CM | POA: Diagnosis not present

## 2022-12-13 DIAGNOSIS — I482 Chronic atrial fibrillation, unspecified: Secondary | ICD-10-CM | POA: Diagnosis not present

## 2022-12-13 DIAGNOSIS — I10 Essential (primary) hypertension: Secondary | ICD-10-CM | POA: Diagnosis not present

## 2022-12-13 DIAGNOSIS — Z7689 Persons encountering health services in other specified circumstances: Secondary | ICD-10-CM | POA: Diagnosis not present

## 2022-12-13 DIAGNOSIS — R296 Repeated falls: Secondary | ICD-10-CM | POA: Diagnosis not present

## 2023-01-07 DEATH — deceased

## 2023-11-30 NOTE — Telephone Encounter (Signed)
 Pt in wanting an antibiotic called in for her. Did not want to make an appointment to be seen. Last contact we had w/pt was on 06/09/2018 on tele health phone visit. I informed pt that we can not call in an antibiotic for her since it has been over a year since we have seen her. She has been sick since after Thanksgiving and not getting better. She has not seen pcp or had covid test since this started. She did not want to make an appointment at this time.
